# Patient Record
Sex: Male | Born: 1940 | Race: White | Hispanic: No | Marital: Married | Smoking: Former smoker
Health system: Southern US, Community
[De-identification: ages and names within clinical notes are randomized; demographics above are authoritative.]

## PROBLEM LIST (undated history)

## (undated) DIAGNOSIS — C801 Malignant (primary) neoplasm, unspecified: Secondary | ICD-10-CM

## (undated) DIAGNOSIS — G4733 Obstructive sleep apnea (adult) (pediatric): Secondary | ICD-10-CM

## (undated) DIAGNOSIS — D649 Anemia, unspecified: Secondary | ICD-10-CM

## (undated) DIAGNOSIS — T4145XA Adverse effect of unspecified anesthetic, initial encounter: Secondary | ICD-10-CM

## (undated) DIAGNOSIS — IMO0001 Reserved for inherently not codable concepts without codable children: Secondary | ICD-10-CM

## (undated) DIAGNOSIS — I5022 Chronic systolic (congestive) heart failure: Secondary | ICD-10-CM

## (undated) DIAGNOSIS — F329 Major depressive disorder, single episode, unspecified: Secondary | ICD-10-CM

## (undated) DIAGNOSIS — J449 Chronic obstructive pulmonary disease, unspecified: Secondary | ICD-10-CM

## (undated) DIAGNOSIS — I2510 Atherosclerotic heart disease of native coronary artery without angina pectoris: Secondary | ICD-10-CM

## (undated) DIAGNOSIS — I472 Ventricular tachycardia, unspecified: Secondary | ICD-10-CM

## (undated) DIAGNOSIS — I219 Acute myocardial infarction, unspecified: Secondary | ICD-10-CM

## (undated) DIAGNOSIS — I10 Essential (primary) hypertension: Secondary | ICD-10-CM

## (undated) DIAGNOSIS — I255 Ischemic cardiomyopathy: Secondary | ICD-10-CM

## (undated) DIAGNOSIS — I4891 Unspecified atrial fibrillation: Secondary | ICD-10-CM

## (undated) DIAGNOSIS — Z95811 Presence of heart assist device: Secondary | ICD-10-CM

## (undated) DIAGNOSIS — G2581 Restless legs syndrome: Secondary | ICD-10-CM

## (undated) DIAGNOSIS — J61 Pneumoconiosis due to asbestos and other mineral fibers: Secondary | ICD-10-CM

## (undated) DIAGNOSIS — Z9981 Dependence on supplemental oxygen: Secondary | ICD-10-CM

## (undated) DIAGNOSIS — E785 Hyperlipidemia, unspecified: Secondary | ICD-10-CM

## (undated) DIAGNOSIS — K7460 Unspecified cirrhosis of liver: Secondary | ICD-10-CM

## (undated) DIAGNOSIS — E039 Hypothyroidism, unspecified: Secondary | ICD-10-CM

## (undated) DIAGNOSIS — F32A Depression, unspecified: Secondary | ICD-10-CM

## (undated) DIAGNOSIS — E669 Obesity, unspecified: Secondary | ICD-10-CM

## (undated) DIAGNOSIS — Z9989 Dependence on other enabling machines and devices: Secondary | ICD-10-CM

## (undated) DIAGNOSIS — R040 Epistaxis: Secondary | ICD-10-CM

## (undated) DIAGNOSIS — I4729 Other ventricular tachycardia: Secondary | ICD-10-CM

## (undated) DIAGNOSIS — Z9289 Personal history of other medical treatment: Secondary | ICD-10-CM

## (undated) DIAGNOSIS — T8859XA Other complications of anesthesia, initial encounter: Secondary | ICD-10-CM

## (undated) DIAGNOSIS — I509 Heart failure, unspecified: Secondary | ICD-10-CM

## (undated) DIAGNOSIS — Z7901 Long term (current) use of anticoagulants: Secondary | ICD-10-CM

## (undated) HISTORY — DX: Chronic obstructive pulmonary disease, unspecified: J44.9

## (undated) HISTORY — DX: Hyperlipidemia, unspecified: E78.5

## (undated) HISTORY — DX: Ischemic cardiomyopathy: I25.5

## (undated) HISTORY — DX: Chronic systolic (congestive) heart failure: I50.22

## (undated) HISTORY — PX: APPENDECTOMY: SHX54

## (undated) HISTORY — DX: Other ventricular tachycardia: I47.29

## (undated) HISTORY — DX: Ventricular tachycardia: I47.2

## (undated) HISTORY — DX: Ventricular tachycardia, unspecified: I47.20

## (undated) HISTORY — DX: Pneumoconiosis due to asbestos and other mineral fibers: J61

## (undated) HISTORY — DX: Long term (current) use of anticoagulants: Z79.01

## (undated) HISTORY — DX: Hypothyroidism, unspecified: E03.9

## (undated) HISTORY — DX: Heart failure, unspecified: I50.9

## (undated) HISTORY — PX: CARDIAC DEFIBRILLATOR PLACEMENT: SHX171

## (undated) HISTORY — DX: Atherosclerotic heart disease of native coronary artery without angina pectoris: I25.10

## (undated) HISTORY — DX: Unspecified atrial fibrillation: I48.91

## (undated) HISTORY — DX: Presence of heart assist device: Z95.811

## (undated) HISTORY — DX: Obesity, unspecified: E66.9

---

## 2000-07-27 ENCOUNTER — Emergency Department (HOSPITAL_COMMUNITY): Admission: EM | Admit: 2000-07-27 | Discharge: 2000-07-27 | Payer: Self-pay | Admitting: Emergency Medicine

## 2001-09-30 HISTORY — PX: CORONARY ARTERY BYPASS GRAFT: SHX141

## 2002-08-27 ENCOUNTER — Ambulatory Visit (HOSPITAL_COMMUNITY): Admission: RE | Admit: 2002-08-27 | Discharge: 2002-08-27 | Payer: Self-pay | Admitting: Cardiology

## 2002-08-27 ENCOUNTER — Encounter: Payer: Self-pay | Admitting: Cardiology

## 2002-09-08 ENCOUNTER — Encounter: Payer: Self-pay | Admitting: Cardiothoracic Surgery

## 2002-09-08 ENCOUNTER — Inpatient Hospital Stay (HOSPITAL_COMMUNITY): Admission: RE | Admit: 2002-09-08 | Discharge: 2002-09-15 | Payer: Self-pay | Admitting: Cardiothoracic Surgery

## 2002-09-09 ENCOUNTER — Encounter: Payer: Self-pay | Admitting: Cardiothoracic Surgery

## 2002-09-10 ENCOUNTER — Encounter: Payer: Self-pay | Admitting: Cardiothoracic Surgery

## 2002-09-11 ENCOUNTER — Encounter: Payer: Self-pay | Admitting: Cardiothoracic Surgery

## 2002-10-08 ENCOUNTER — Encounter: Admission: RE | Admit: 2002-10-08 | Discharge: 2002-10-08 | Payer: Self-pay | Admitting: Cardiothoracic Surgery

## 2002-10-08 ENCOUNTER — Encounter: Payer: Self-pay | Admitting: Cardiothoracic Surgery

## 2002-10-18 ENCOUNTER — Encounter (HOSPITAL_COMMUNITY): Admission: RE | Admit: 2002-10-18 | Discharge: 2002-11-15 | Payer: Self-pay | Admitting: Cardiology

## 2003-03-02 ENCOUNTER — Encounter: Payer: Self-pay | Admitting: Cardiology

## 2003-03-02 ENCOUNTER — Encounter: Payer: Self-pay | Admitting: Internal Medicine

## 2003-03-02 ENCOUNTER — Ambulatory Visit (HOSPITAL_COMMUNITY): Admission: RE | Admit: 2003-03-02 | Discharge: 2003-03-02 | Payer: Self-pay | Admitting: Cardiology

## 2003-05-11 ENCOUNTER — Ambulatory Visit (HOSPITAL_COMMUNITY): Admission: RE | Admit: 2003-05-11 | Discharge: 2003-05-11 | Payer: Self-pay | Admitting: Internal Medicine

## 2003-05-11 ENCOUNTER — Encounter: Payer: Self-pay | Admitting: Internal Medicine

## 2006-10-30 ENCOUNTER — Ambulatory Visit: Payer: Self-pay | Admitting: Internal Medicine

## 2007-01-05 ENCOUNTER — Ambulatory Visit: Payer: Self-pay | Admitting: Internal Medicine

## 2007-03-09 ENCOUNTER — Encounter: Payer: Self-pay | Admitting: Internal Medicine

## 2007-04-21 ENCOUNTER — Ambulatory Visit: Payer: Self-pay | Admitting: Internal Medicine

## 2007-05-18 ENCOUNTER — Ambulatory Visit: Payer: Self-pay | Admitting: Internal Medicine

## 2007-05-18 LAB — CONVERTED CEMR LAB
BUN: 17 mg/dL (ref 6–23)
Basophils Absolute: 0 10*3/uL (ref 0.0–0.1)
Basophils Relative: 0.2 % (ref 0.0–1.0)
CO2: 28 meq/L (ref 19–32)
Calcium: 10.1 mg/dL (ref 8.4–10.5)
Chloride: 105 meq/L (ref 96–112)
Creatinine, Ser: 0.9 mg/dL (ref 0.4–1.5)
Eosinophils Absolute: 0.3 10*3/uL (ref 0.0–0.6)
Eosinophils Relative: 3.2 % (ref 0.0–5.0)
GFR calc Af Amer: 109 mL/min
GFR calc non Af Amer: 90 mL/min
Glucose, Bld: 104 mg/dL — ABNORMAL HIGH (ref 70–99)
HCT: 40.2 % (ref 39.0–52.0)
Hemoglobin: 14.1 g/dL (ref 13.0–17.0)
Lymphocytes Relative: 28.5 % (ref 12.0–46.0)
MCHC: 35.2 g/dL (ref 30.0–36.0)
MCV: 90.2 fL (ref 78.0–100.0)
Monocytes Absolute: 1 10*3/uL — ABNORMAL HIGH (ref 0.2–0.7)
Monocytes Relative: 9.4 % (ref 3.0–11.0)
Neutro Abs: 6.2 10*3/uL (ref 1.4–7.7)
Neutrophils Relative %: 58.7 % (ref 43.0–77.0)
Platelets: 155 10*3/uL (ref 150–400)
Potassium: 4.3 meq/L (ref 3.5–5.1)
RBC: 4.46 M/uL (ref 4.22–5.81)
RDW: 13.5 % (ref 11.5–14.6)
Sodium: 137 meq/L (ref 135–145)
WBC: 10.5 10*3/uL (ref 4.5–10.5)

## 2007-05-22 ENCOUNTER — Ambulatory Visit (HOSPITAL_COMMUNITY): Admission: RE | Admit: 2007-05-22 | Discharge: 2007-05-22 | Payer: Self-pay | Admitting: Cardiology

## 2007-07-15 ENCOUNTER — Ambulatory Visit: Payer: Self-pay | Admitting: Internal Medicine

## 2007-10-12 ENCOUNTER — Ambulatory Visit: Payer: Self-pay

## 2008-01-12 ENCOUNTER — Ambulatory Visit: Payer: Self-pay

## 2008-04-18 ENCOUNTER — Ambulatory Visit: Payer: Self-pay

## 2008-08-08 ENCOUNTER — Ambulatory Visit: Payer: Self-pay | Admitting: Internal Medicine

## 2008-11-21 ENCOUNTER — Ambulatory Visit: Payer: Self-pay | Admitting: Internal Medicine

## 2008-11-22 ENCOUNTER — Encounter: Payer: Self-pay | Admitting: Internal Medicine

## 2009-02-17 ENCOUNTER — Encounter (INDEPENDENT_AMBULATORY_CARE_PROVIDER_SITE_OTHER): Payer: Self-pay | Admitting: *Deleted

## 2009-02-20 ENCOUNTER — Ambulatory Visit: Payer: Self-pay | Admitting: Internal Medicine

## 2009-02-20 ENCOUNTER — Encounter: Payer: Self-pay | Admitting: Internal Medicine

## 2009-05-01 ENCOUNTER — Ambulatory Visit: Payer: Self-pay | Admitting: Internal Medicine

## 2009-05-23 ENCOUNTER — Encounter: Payer: Self-pay | Admitting: Internal Medicine

## 2009-08-14 ENCOUNTER — Ambulatory Visit: Payer: Self-pay | Admitting: Internal Medicine

## 2009-08-18 ENCOUNTER — Encounter: Payer: Self-pay | Admitting: Internal Medicine

## 2009-08-29 ENCOUNTER — Encounter: Payer: Self-pay | Admitting: Internal Medicine

## 2010-01-23 ENCOUNTER — Encounter: Payer: Self-pay | Admitting: Internal Medicine

## 2010-02-07 ENCOUNTER — Encounter: Payer: Self-pay | Admitting: Internal Medicine

## 2010-03-29 ENCOUNTER — Telehealth (INDEPENDENT_AMBULATORY_CARE_PROVIDER_SITE_OTHER): Payer: Self-pay | Admitting: *Deleted

## 2010-05-18 ENCOUNTER — Ambulatory Visit: Payer: Self-pay | Admitting: Cardiovascular Disease

## 2010-10-26 ENCOUNTER — Ambulatory Visit: Payer: Self-pay | Admitting: Cardiovascular Disease

## 2010-10-30 NOTE — Progress Notes (Signed)
  Records Recieved from [facility] [place] gave to [facility] [place]  March 29, 2010 3:01 PM

## 2010-10-30 NOTE — Letter (Signed)
Summary: [facility] Office Visit Note   [facility] Office Visit Note   Imported By: Roderic Ovens 04/25/2010 14:38:08  _____________________________________________________________________  External Attachment:    Type:   Image     Comment:   External Document

## 2010-10-30 NOTE — Letter (Signed)
Summary: [facility] CT Chest. Cath & Consult 2003  - 2004   [facility] CT Chest. Cath & Consult 2003  - 2004   Imported By: Roderic Ovens 04/25/2010 14:44:37  _____________________________________________________________________  External Attachment:    Type:   Image     Comment:   External Document

## 2010-10-30 NOTE — Progress Notes (Signed)
Summary: [facility] Office Visit Note   [facility] Office Visit Note   Imported By: Roderic Ovens 04/25/2010 14:37:32  _____________________________________________________________________  External Attachment:    Type:   Image     Comment:   External Document

## 2010-10-30 NOTE — Letter (Signed)
Summary: [facility] Office Visit Note   [facility] Office Visit Note   Imported By: Roderic Ovens 04/25/2010 14:38:28  _____________________________________________________________________  External Attachment:    Type:   Image     Comment:   External Document

## 2010-10-30 NOTE — Miscellaneous (Signed)
Summary: med list  Clinical Lists Changes  Medications: Added new medication of SYNTHROID 100 MCG TABS (LEVOTHYROXINE SODIUM) 1 by mouth once daily Added new medication of ASPIRIN ADULT LOW STRENGTH 81 MG TBEC (ASPIRIN) 1 by mouth once daily Added new medication of STOOL SOFTENER 100 MG TABS (DOCUSATE SODIUM) 1 two times a day Added new medication of MULTIVITAMINS   TABS (MULTIPLE VITAMIN) 1 by mouth once daily Added new medication of CARVEDILOL 25 MG TABS (CARVEDILOL) 1/2 by mouth two times a day Added new medication of LISINOPRIL 40 MG TABS (LISINOPRIL) 1/2 by mouth once daily Added new medication of SIMVASTATIN 80 MG TABS (SIMVASTATIN) Take 1/2 tablet by mouth daily at bedtime Added new medication of RANITIDINE HCL 150 MG CAPS (RANITIDINE HCL) 1 by mouth two times a day Added new medication of FISH OIL 1000 MG CAPS (OMEGA-3 FATTY ACIDS) 1 by mouth two times a day Added new medication of WARFARIN SODIUM 5 MG TABS (WARFARIN SODIUM) Use as directed by Anticoagulation Clinic

## 2010-10-31 ENCOUNTER — Encounter (INDEPENDENT_AMBULATORY_CARE_PROVIDER_SITE_OTHER): Payer: Self-pay | Admitting: *Deleted

## 2010-11-07 NOTE — Letter (Signed)
Summary: Appointment - Reminder 2  [facility], Main Office  [address]   [place], [place] [address]   Phone: [phone]  Fax: [phone]     October 31, 2010 MRN: [id number]   ALYXANDER KOLLMANN [address] [place], [place]  [address]   Dear Mr. MCKAMIE,  Our records indicate that it is time to schedule a device check.  Dr.Nahser recommended that you follow up with our device clinic in February. It is very important that we reach you to schedule this appointment. We look forward to participating in your health care needs. Please contact us at the number listed above at your earliest convenience to schedule your appointment.  If you are unable to make an appointment at this time, give [place] a call so we can update our records.     Sincerely,   Neurosurgeon Team Unable to reach by phone [phone]

## 2010-11-08 ENCOUNTER — Encounter (INDEPENDENT_AMBULATORY_CARE_PROVIDER_SITE_OTHER): Payer: Medicare Other

## 2010-11-08 ENCOUNTER — Encounter: Payer: Self-pay | Admitting: Internal Medicine

## 2010-11-08 DIAGNOSIS — I428 Other cardiomyopathies: Secondary | ICD-10-CM

## 2010-11-15 NOTE — Procedures (Signed)
Summary: DEFIB/AMD   Current Medications (verified): 1)  Synthroid 100 Mcg Tabs (Levothyroxine Sodium) .[name].. 1 By Mouth Once Daily 2)  Aspirin Adult Low Strength 81 Mg Tbec (Aspirin) .[name].. 1 By Mouth Once Daily 3)  Stool Softener 100 Mg Tabs (Docusate Sodium) .[name].. 1 Two Times A Day 4)  Multivitamins   Tabs (Multiple Vitamin) .[name].. 1 By Mouth Once Daily 5)  Carvedilol 25 Mg Tabs (Carvedilol) .... 1/2 By Mouth Two Times A Day 6)  Lisinopril 40 Mg Tabs (Lisinopril) .... 1/2 By Mouth Once Daily 7)  Simvastatin 80 Mg Tabs (Simvastatin) .... Take 1/2 Tablet By Mouth Daily At Bedtime 8)  Ranitidine Hcl 150 Mg Caps (Ranitidine Hcl) .[name].. 1 By Mouth Two Times A Day 9)  Warfarin Sodium 5 Mg Tabs (Warfarin Sodium) .... Use As Directed By Anticoagulation Clinic 10)  Saw Palmetto 450 Mg Caps (Saw Palmetto (Serenoa Repens)) .... 2 By Mouth Two Times A Day  Allergies (verified): No Known Drug Allergies    ICD Specifications Following MD:  Sherryl Manges, MD     ICD Vendor:  Medtronic     ICD Model Number:  [id number]     ICD Serial Number:  [id number] ICD DOI:  01/25/2010     ICD Implanting MD:  NOT IMPLANTED BY [place]  Lead 1:    Location: RV     DOI: 05/11/2003     Model #: [id number]     Serial #: [id number]     Status: active  Indications::  ICM  Explantation Comments: 01/25/10 [id number] H explanted  ICD Follow Up Remote Check?  No Battery Voltage:  3.18 V     Charge Time:  5.9 seconds     Underlying rhythm:  SR ICD Dependent:  No       ICD Device Measurements Right Ventricle:  Amplitude: 11.4 mV, Impedance: 361 ohms, Threshold: 1.25 V at 0.4 msec  Episodes Coumadin:  Yes Shock:  0     ATP:  5     Nonsustained:  1     Ventricular Pacing:  1.3%  Brady Parameters Mode VVI     Lower Rate Limit:  40      Tachy Zones VF:  188     VT:  146     Next Cardiology Appt Due:  01/29/2011 Tech Comments:  Change out done 01/25/10 @ the [facility].  Patient wants to be followed by [place].  Lead impedance alert values  reprogrammed.3 VT episodes noted all 09/05/10, 2 of which were treated with ATP X 2 with success after the 2nd burst.    ROV 3 months with Dr. Graciela Husbands in [place]. Altha Harm, LPN  November 08, 2010 2:01 PM

## 2010-12-06 NOTE — Cardiovascular Report (Signed)
Summary: Office Visit   Office Visit   Imported By: Roderic Ovens 11/26/2010 15:48:40  _____________________________________________________________________  External Attachment:    Type:   Image     Comment:   External Document

## 2010-12-12 ENCOUNTER — Ambulatory Visit (INDEPENDENT_AMBULATORY_CARE_PROVIDER_SITE_OTHER): Payer: Medicare Other | Admitting: Cardiovascular Disease

## 2010-12-12 DIAGNOSIS — Z9861 Coronary angioplasty status: Secondary | ICD-10-CM

## 2010-12-12 DIAGNOSIS — I5022 Chronic systolic (congestive) heart failure: Secondary | ICD-10-CM

## 2010-12-21 ENCOUNTER — Encounter (INDEPENDENT_AMBULATORY_CARE_PROVIDER_SITE_OTHER): Payer: Self-pay | Admitting: *Deleted

## 2010-12-25 ENCOUNTER — Other Ambulatory Visit: Payer: Self-pay | Admitting: *Deleted

## 2010-12-25 ENCOUNTER — Encounter: Payer: Self-pay | Admitting: *Deleted

## 2010-12-25 DIAGNOSIS — I2510 Atherosclerotic heart disease of native coronary artery without angina pectoris: Secondary | ICD-10-CM

## 2010-12-26 ENCOUNTER — Ambulatory Visit (HOSPITAL_COMMUNITY): Payer: Medicare Other | Attending: Cardiovascular Disease | Admitting: Radiology

## 2010-12-26 VITALS — Ht 72.0 in | Wt 243.0 lb

## 2010-12-26 DIAGNOSIS — R0989 Other specified symptoms and signs involving the circulatory and respiratory systems: Secondary | ICD-10-CM

## 2010-12-26 DIAGNOSIS — I2510 Atherosclerotic heart disease of native coronary artery without angina pectoris: Secondary | ICD-10-CM

## 2010-12-26 DIAGNOSIS — R0789 Other chest pain: Secondary | ICD-10-CM

## 2010-12-26 DIAGNOSIS — R079 Chest pain, unspecified: Secondary | ICD-10-CM | POA: Insufficient documentation

## 2010-12-26 DIAGNOSIS — I25810 Atherosclerosis of coronary artery bypass graft(s) without angina pectoris: Secondary | ICD-10-CM

## 2010-12-26 DIAGNOSIS — I252 Old myocardial infarction: Secondary | ICD-10-CM

## 2010-12-26 DIAGNOSIS — R0609 Other forms of dyspnea: Secondary | ICD-10-CM

## 2010-12-26 MED ORDER — REGADENOSON 0.4 MG/5ML IV SOLN
0.4000 mg | Freq: Once | INTRAVENOUS | Status: AC
  Administered 2010-12-26: 0.4 mg via INTRAVENOUS

## 2010-12-26 MED ORDER — TECHNETIUM TC 99M TETROFOSMIN IV KIT
33.0000 | PACK | Freq: Once | INTRAVENOUS | Status: AC | PRN
  Administered 2010-12-26: 33 via INTRAVENOUS

## 2010-12-26 NOTE — Progress Notes (Signed)
[facility] 3 NUCLEAR MED [address] 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 History of intermittent AFIB, CHF,ICM,and Echo. Cardiac Risk Factors: Family History - CAD, History of Smoking and Lipids  Symptoms:  Chest Tightness with Exertion (last date of chest discomfort yesterday) radiates to stomach, DOE, Fatigue, Fatigue with Exertion, SOB.   Nuclear Pre-Procedure Caffeine/Decaff Intake:  None NPO After: 6:00 PM   Lungs:  Clear IV 0.9% NS with Angio Cath:  18G  IV Site: R Antecubital  IV Started by:  Irean Hong, RN  Chest Size (in):  48 Cup Size:   NA  Height: 6' (1.829 m)  Weight:  243 lb (110.224 kg)  BMI:  Body mass index is 32.96 kg/(m^2). Tech Comments:  Held carvedilol this am.    Nuclear Med Study 1 or 2 day study: 2 day  Stress Test Type:  Lexiscan  Reading MD:  Dietrich Pates MD Order Authorizing Provider:  Dr. Jannette Spanner  Resting Radionuclide: Technetium 28m Tetrofosmin  Resting Radionuclide Dose: 33 mCi   Stress Radionuclide:  Technetium 34m Tetrofosmin  Stress Radionuclide Dose: 33 mCi           Stress Protocol Rest HR: 55 Stress HR: 68  Rest BP: 136/53 Stress BP: 122/52  Exercise Time:  NA min NA  Predicted HR: 151 % of Maximum: 45.03    Predicted Max HR: 151 bpm % Max HR: 45.03 bpm Rate Pressure Product: 8296    Dose of Adenosine:  NA mg Dose of Lexiscan:  0.4 mg  Dose of Atropine:  NA mg Dose of Dobutamine:  NA mcg/kg/min (at max HR)  Stress Test Technologist: Irean Hong, RN  Nuclear Technologist:  Harlow Asa, CNMT     Rest Procedure:  Myocardial perfusion  imaging was performed at rest 45 minutes following the intravenous administration of Technetium 61m Tetrofosmin. Rest ECG: SB with nonspecific T wave changes, marked sinus arrhythmia, PVC's, and fusion beat.  Stress Procedure:  The patient received IV Lexiscan 0.4 mg over 15-seconds.  Technetium 30m Tetrofosmin injected at 30-seconds.  There were nonspecific T wave changes with Lexiscan that were present at baseline. There were frequent PVC's, trigeminy, and begiminy.  Quantitative spect images were obtained after a 45 minute delay. Stress ECG: No significant change from baseline ECG  QPS Raw Data Images:  Soft tissue (diaphragm, bowel activity) underlie heart. Stress Images:  Large defect in the anterior (mid, distal), anteorlateral (distal), anteroseptal (mid, distal)inferolateral (base, mid, distal) inferior (base,mid, distal) and apeical walls. Rest Images:  Large defect in the anterior wall (mid, distal), anterolateral wall (distal), anterosetal wall (mid, distal), inferolateral wall (base,mid, distal) inferior wall (base, mid, distal) and apex. Subtraction (SDS):  No evidence of ischemia.  Findings Risk Category:  Low risk nuclear study. Clinically Abnormal:  No Ischemia:  No Fixed Defect: Fixed defect in the anterior, anterolateral, inferolateral, inferior, anteroseptal and apical walls. LV Dysfunction:  Yes Transient Ischemic Dilatation (Normal <1.22):1.03 Lung/Heart Ratio (Normal <0.45): .44  Quantitative Gated Spect Images QGS EDV: 285 ml QGS ESV:  194 ml QGS cine images:  Hypokinesis/akinesis of the inferior, mid/distal anteror, distal anteroseptal and apical walls. QGS EF:  32 %  Impression Exercise Capacity:  Lexiscan with no exercise. BP Response:  Normal blood pressure response. Clinical Symptoms:  No chest pain. ECG Impression:  No significant ST segment change suggestive of ischemia. Comparison with Prior Nuclear Study: Unable to review.  Done elsewhere in  08/2009  Overall Impression:  Scar in the anterior, anterolateral, anteroseptal, inferior, inferolateral and apical walls.  No ischemia.

## 2010-12-27 ENCOUNTER — Ambulatory Visit (HOSPITAL_COMMUNITY): Payer: Medicare Other | Attending: Cardiovascular Disease | Admitting: Radiology

## 2010-12-27 DIAGNOSIS — R0789 Other chest pain: Secondary | ICD-10-CM

## 2010-12-27 MED ORDER — TECHNETIUM TC 99M TETROFOSMIN IV KIT
33.0000 | PACK | Freq: Once | INTRAVENOUS | Status: AC | PRN
  Administered 2010-12-27: 33 via INTRAVENOUS

## 2010-12-27 NOTE — Letter (Signed)
Summary: Appointment - Reminder 2  [facility], Main Office  [address]   [place], [place] [address]   Phone: [phone]  Fax: [phone]     December 21, 2010 MRN: [id number]   AQUIL DUHE [address] [place], [place]  [address]   Dear Mr. HAIK,  Our records indicate that it is past time to schedule a device check appointment.  Dr.Nahser recommended that you follow up with [place] in February. It is very important that we reach you to schedule this appointment. We look forward to participating in your health care needs. Please contact us at the number listed above at your earliest convenience to schedule your appointment.  If you are unable to make an appointment at this time, give [place] a call so we can update our records.     Sincerely,   Glass blower/designer

## 2010-12-28 ENCOUNTER — Telehealth: Payer: Self-pay | Admitting: Cardiovascular Disease

## 2010-12-28 NOTE — Telephone Encounter (Signed)
PT'S WIFE CALLED SAYING [facility] SAID PT'S  TEST RESULTS WOULD BE READY THIS MORNING BY 8AM AND THEY HAVE NOT HEARD ANYTHING.

## 2011-01-16 ENCOUNTER — Ambulatory Visit: Payer: Medicare Other | Admitting: Cardiovascular Disease

## 2011-01-21 ENCOUNTER — Encounter: Payer: Self-pay | Admitting: Cardiovascular Disease

## 2011-02-12 NOTE — Progress Notes (Signed)
[facility] OFFICE NOTE   TYREES, CHOPIN                     MRN:          [id number]  DATE:08/08/2008                            DOB:          April 05, 1941    [name] is seen in followup for ICD implanted for primary prevention.  He has a history of paroxysmal atrial fibrillation.  He is doing well at  this point without complaints of chest pain, shortness of breath, or  peripheral edema.   MEDICATIONS:  1. Synthroid 100.  2. Aspirin 81.  3. Coreg 12.5.  4. Lisinopril 20.  5. Simvastatin 40.  6. Ranitidine 150 b.i.d.  7. Multivitamins.  8. Warfarin.   PHYSICAL EXAMINATION:  VITAL SIGNS:  His blood pressure was well-  controlled of 126/69.  His pulse was 54.  His weight was 243.  LUNGS:  Clear.  HEART:  Sounds were regular.  EXTREMITIES:  Without edema.   I should note that he uses nocturnal oxygen.   Interrogation of his Medtronic Maximo ICD demonstrates an R-wave of 10.4  with impedance of 324 with a threshold of 1 volt at 0.4.  Battery  voltage was 2.88.   IMPRESSION:  1. Ischemic cardiomyopathy.      a.     Prior bypass.      b.     Depressed left ventricular function, previously less than       30%.  2. Status post implantable cardioverter-defibrillator for the above.  3. Atrial fibrillation - Persistent on Coumadin.   Mr. Hanton is doing quite well at this time.  No arrhythmia issues are  active.  He will follow up with Dr. Aleen Campi.  We will see him again in  device followup in 12 months and will be followed remotely in the  interim.     Duke Salvia, MD, [facility]  Electronically Signed    SCK/MedQ  DD: 08/08/2008  DT: 08/09/2008  Job #: [id number]   cc:   Antionette Char, MD

## 2011-02-12 NOTE — Letter (Signed)
April 21, 2007    Jaclyn Prime. Lucas Mallow, M.D.  [address]  [place], [place] [address]   RE:  [name], [name]  MRN:  [id number]  /  DOB:  1941-06-27   Dear Onalee Hua,   [name] comes in today.  He is, as you know, in atrial fibrillation  as you detected about a month or so ago.  He has been initiated on  Coumadin, and apparently his INRs are now therapeutic.  He notes no  symptoms with the atrial fibrillation.   There is, however, an issue that he alludes to that I do not quite  understand that has to do with his heart not getting enough oxygen.  This was done by some overnight probe, and I wonder whether this does  not refer to nocturnal O2 desaturation.  He is supposed to be seen at  the [facility] for this later this week.   CURRENT MEDICATIONS:  1. Synthroid 100.  2. Coreg 12.5 b.i.d.  3. Lisinopril 20.  4. Simvastatin.  5. Ranitidine.  6. Coumadin.   PHYSICAL EXAMINATION:  VITAL SIGNS:  His blood pressure is mildly  elevated at 130/66, pulse 52.  LUNGS:  Clear.  HEART:  Sounds were irregular.  EXTREMITIES:  Without edema.   Interrogation of his Medtronic Woodson ICD demonstrates an R wave of  8.3, with impedance of 344, threshold at 1 V at 0.4.  The battery  voltage is 3.04.  His device was reprogrammed to maximize longevity.  Heart rate excursion was noted to be relatively flat and somewhat  surprisingly so.   IMPRESSION:  1. Ischemic heart disease.      a.     Status post bypass.      b.     Prior percutaneous coronary intervention.      c.     Ejection fraction 20%-25%.  2. Class I-II congestive heart failure.  3. Status post implantable cardioverter defibrillator for the above.  4. Recent diagnosis of atrial fibrillation.  5. Question of sleep apnea.   Onalee Hua, I would like to pursue cardioversion of [name].  Even in the  absence of symptoms, given that there is a 25% chance that he would be  in sinus rhythm at the end of a year and that the Coumadin issue  is  nonnegotiable, the restoration of sinus rhythm could only be beneficial  in that situation, I think.   We will plan to get the information from [place] at your office as to  when his INRs have been therapeutic and then proceed with DC  cardioversion potentially through his defibrillator.   I look forward to getting the information also about his oxygen thing,  because it may be that his atrial fibrillation might be benefitted also.    Sincerely,      Duke Salvia, MD, [facility]  Electronically Signed    SCK/MedQ  DD: 04/21/2007  DT: 04/22/2007  Job #: [id number]

## 2011-02-12 NOTE — Letter (Signed)
Feb 20, 2009    Ryan Char, MD  [address]  [place], [place] [address]   RE:  Ryan, Wood  MRN:  [id number]  /  DOB:  1941-03-15   Dear [name]:   [name] is seen in followup for ICD implanted for primary  prevention.  He is doing quite well at this point.  He has no complaints  of chest pain, shortness of breath, or palpitations.   MEDICATIONS:  His medications are unchanged.  He continues on warfarin,  lisinopril, Coreg 12.5 b.i.d. up titration, of which is limited by  bradycardia. Aspirin and Synthroid.   PHYSICAL EXAMINATION:  His blood pressure was 114/60, his pulse was 56,  his weight was 237, which is down 6 pounds since last fall.  His neck  veins were flat.  His lungs were clear.  Heart sounds were irregular.  Abdomen was soft.  Extremities were without edema.   Interrogation of his Medtronic ICD demonstrates a battery voltage 2.74,  a R-wave of 9.1, impedance of 336. A threshold was checked, but I do not  have it recorded.  Unfortunately there is no intercurrent therapies.   IMPRESSION:  1. Ischemic cardiomyopathy with      a.     Prior bypass surgery.      b.     Depressed left ventricular function with ejection fraction       about 30%.  2. Atrial fibrillation - Permanent on Coumadin.  3. Status post implantable cardioverter-defibrillator for the above      without intercurrent therapies.   [name], [name] is doing quite well.   We will plan to see him again as part of routine device followup.  He  did mention that he heard that your retired, I have not heard that, but  congratulations.    Sincerely,      Ryan Salvia, MD, [facility]  Electronically Signed    SCK/MedQ  DD: 02/20/2009  DT: 02/21/2009  Job #: [id number]

## 2011-02-12 NOTE — Assessment & Plan Note (Signed)
[facility]                         ELECTROPHYSIOLOGY OFFICE NOTE   QUANTAE, MARTEL                     MRN:          [id number]  DATE:07/15/2007                            DOB:          1941/06/05    [name] is seen, following ICD implantation in the state of ischemic  heart disease.  He had atrial fibrillation that was paroxysmal, reverted  spontaneously.  He holds sinus rhythm.  He has no complaints of chest  pain or shortness of breath.   CURRENT MEDICATIONS:  Include Synthroid, lisinopril 20, Simvastatin 40,  Warfarin and Coreg 12.5 b.i.d.   EXAMINATION:  VITAL SIGNS:  Today, his blood pressure was 114/67 with a  pulse of 59.  LUNGS:  Clear.  Heart sounds were regular.  EXTREMITIES:  Were without edema.   Interrogation of his Medtronic ICD demonstrates an R-wave of 8.7 with  impedance of 344 and threshold of 1.5 at 0.3 milliseconds, battery  voltage 3.02.  The device was re-programmed for adequate RV pacing.  His  device is programmed in a somewhat unusual way.  It is a 1-zone device,  without intercurrent therapies, and he has monitors on from 146 to 188.   IMPRESSION:  1. Ischemic heart disease.  2. Atrial fibrillation - paroxysmal.  3. Status post ICD for primary prevention.  4. Ischemic heart disease with prior bypass surgery.   Mr. Rossetti is stable.  We will see him again in three months' time.  He  will follow up with Dr. Lucas Mallow as otherwise scheduled.     Duke Salvia, MD, [facility]  Electronically Signed    SCK/MedQ  DD: 07/15/2007  DT: 07/16/2007  Job #: [id number]   cc:   Jaclyn Prime. Lucas Mallow, M.D.

## 2011-02-12 NOTE — Discharge Summary (Signed)
NAMEDEMORIO, SEELEY              ACCOUNT NO.:  [id number]   MEDICAL RECORD NO.:  [id number]          PATIENT TYPE:  OIB   LOCATION:  2899                         FACILITY:  [facility]   PHYSICIAN:  Maple Mirza, PA   DATE OF BIRTH:  1940-11-25   DATE OF ADMISSION:  05/22/2007  DATE OF DISCHARGE:  05/22/2007                               DISCHARGE SUMMARY   He was admitted to Short Stay C May 22, 2007, for DC cardioversion.   BRIEF HISTORY:  This is a 70 year old male who has a history of ischemic  cardiomyopathy, has also three-vessel coronary artery disease and is  status post coronary artery bypass graft surgery.  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  He  has also had a cardioverter defibrillator implanted for cardiomyopathy  and coronary artery disease.   He was diagnosed with atrial fibrillation on an office visit by Dr.  Aggie Cosier about one month ago.  He has been placed on Coumadin and his  INRs have been therapeutic for about four weeks.  He presented to [facility] 22, 2007, for cardioversion and was found to be  in sinus bradycardia with frequent PVCs.   The patient, with his atrial fibrillation, has absolutely no symptoms.  He does not have chest pain or chest discomfort.  He does not have  dyspnea.  He does not have fatigue.  He does not have dizziness.  He has  had no history of frank syncope.   MEDICATIONS:  The patient is on the following medications.  1. Synthroid 100 mcg daily.  2. Enteric-coated aspirin 81 mg daily.  3. Coreg 25 mg one half tablet twice daily.  4. Lisinopril 20 mg daily.  5. Simvastatin 40 mg nightly.  6. Zantac 150 mg twice daily.  7. Coumadin as directed.  8. Vitamin something daily.  9. Fish oil caps daily.   The question of what to do next looms.  The patient will discharge with  the promise that we will call him at [phone] to discuss future plans.  These may  include possibly antiarrhythmic therapy should he converted  once again the atrial fibrillation or using a device to tell us the  burden of atrial fibrillation. He may certainly benefit from follow-up  echocardiograms in the future to see if there is a tachycardia mediated  cardiomyopathy associated with atrial fibrillation.      Maple Mirza, PA     GM/MEDQ  D:  05/22/2007  T:  05/23/2007  Job:  [id number]

## 2011-02-15 ENCOUNTER — Ambulatory Visit (INDEPENDENT_AMBULATORY_CARE_PROVIDER_SITE_OTHER): Payer: Medicare Other | Admitting: Internal Medicine

## 2011-02-15 ENCOUNTER — Encounter: Payer: Self-pay | Admitting: Internal Medicine

## 2011-02-15 DIAGNOSIS — I2589 Other forms of chronic ischemic heart disease: Secondary | ICD-10-CM

## 2011-02-15 DIAGNOSIS — I255 Ischemic cardiomyopathy: Secondary | ICD-10-CM

## 2011-02-15 DIAGNOSIS — I428 Other cardiomyopathies: Secondary | ICD-10-CM

## 2011-02-15 DIAGNOSIS — I509 Heart failure, unspecified: Secondary | ICD-10-CM

## 2011-02-15 DIAGNOSIS — I472 Ventricular tachycardia: Secondary | ICD-10-CM | POA: Insufficient documentation

## 2011-02-15 DIAGNOSIS — Z95810 Presence of automatic (implantable) cardiac defibrillator: Secondary | ICD-10-CM | POA: Insufficient documentation

## 2011-02-15 DIAGNOSIS — I4891 Unspecified atrial fibrillation: Secondary | ICD-10-CM

## 2011-02-15 NOTE — Assessment & Plan Note (Signed)
No recurrent ventricular tachycardia 

## 2011-02-15 NOTE — Discharge Summary (Signed)
   NAME:  [name], [name]                        ACCOUNT NO.:  [id number]   MEDICAL RECORD NO.:  [id number]                   PATIENT TYPE:  OIB   LOCATION:  3732                                 FACILITY:  [facility]   PHYSICIAN:  Duke Salvia, M.D.               DATE OF BIRTH:  [birth year]   DATE OF ADMISSION:  05/11/2003  DATE OF DISCHARGE:  05/11/2003                                 DISCHARGE SUMMARY   PRIMARY DIAGNOSIS:  Ischemic cardiomyopathy.   HISTORY OF PRESENT ILLNESS:  This is a 70 year old gentleman, retired, who  works as a Electrical engineer at [employer] with a history of multiple infarcts,  bypass surgery in the fall of 2003. Has been having recurrent falls and  night sweats and is not feeling well. Cardiac scanning demonstrated anterior  interior fall infarct extending to the apex with lateral scar, minimal peri-  infarct ischemia felt to be low risk with an EF of 28%. Because of this, the  patient was considered for an implantable cardiac defibrillator. The patient  was enrolled in the Masters trial but waveforms were unable to be diagnosed  secondary to multiple ventricular ectopy. He was then admitted for placement  of a Medtronic ICD. He underwent placement of a Medtronic type ICD on March  11. He tolerated the procedure well and had no immediate postoperative  complications and was discharged later that evening on all of his previous  medications:  Synthroid 0.1 mg daily, Altace 1 mg b.i.d., coated aspirin 81  daily, Zocor 40 daily, multivitamin daily. He was instructed to remove his  outer bandage in the morning and leave tape in place and to clean his wound  once with Betadine. Tylenol one to two tablets every four to six hours as  needed for pain. Activity and wound care were per pacemaker discharge sheet.  The patient was not allowed to drive for approximately one week. Low fat,  low salt, low cholesterol diet. He was scheduled to be seen at the pacemaker  clinic at  the [facility] office August 25 at 9:30 a.m. and follow with Dr.  Graciela Husbands November 16 at 12 noon.      Chinita Pester, C.R.N.P. [facility]                 Duke Salvia, M.D.    DS/MEDQ  D:  05/11/2003  T:  05/12/2003  Job:  [id number]   cc:   Jaclyn Prime. Lucas Mallow, M.D.  [address]  [place]  [place] [address]  Fax: [phone]

## 2011-02-15 NOTE — Assessment & Plan Note (Signed)
Stable coronary disease.

## 2011-02-15 NOTE — Assessment & Plan Note (Signed)
The patient's device was interrogated.  The information was reviewed. No changes were made in the programming.    

## 2011-02-15 NOTE — Assessment & Plan Note (Signed)
Permanent. We will stop his aspirin. We had a lengthy discussion regarding a oral anticoagulation alternatives. [facility] as to whether [facility] is eligible for Pr[facility]a. He will let us know.

## 2011-02-15 NOTE — Patient Instructions (Signed)
Stop your Aspirin. Your physician recommends that you schedule a follow-up appointment in: 1 year with Dr. Graciela Husbands

## 2011-02-15 NOTE — Cardiovascular Report (Signed)
[name], [name]                        ACCOUNT NO.:  [id number]   MEDICAL RECORD NO.:  [id number]                   PATIENT TYPE:  OIB   LOCATION:  2899                                 FACILITY:  [facility]   PHYSICIAN:  Madaline Savage, M.D.             DATE OF BIRTH:  1940-11-17   DATE OF PROCEDURE:  08/27/2002  DATE OF DISCHARGE:                              CARDIAC CATHETERIZATION   PROCEDURES PERFORMED:  1. Selective coronary angiography by Judkins technique.  2. Retrograde left heart catheterization.  3. Left ventricular angiography.  4. Abdominal aortography.  5. Left subclavian arteriography and nonselective visualization of the     internal mammary artery.   ENTRY SITE:  Right femoral.   DYE USED:  Omnipaque.   COMPLICATIONS:  None.   PATIENT PROFILE:  The patient is a 70 year old gentleman who is under the  primary care of Dr. Adela Lank with a history of tobacco use,  hypertension, known coronary artery disease and family history of coronary  disease. He had a anteroseptal myocardial infarction in 1997 and prior to  that he had angioplasty of his LAD in 1991 and 1992.  Recently, the patient  was reevaluated with a Persantine Cardiolite stress test and was found to  have a dilated left ventricle with extensive scar involving the apex,  inferior and inferolateral walls, and there was superimposed ischemia in the  anterior walls and anteroseptal walls and inferior and lateral walls.  We  decided to cardiac catheterization today electively on an outpatient basis.   RESULTS:  PRESSURES:  The left ventricular pressure was 135/13, end-  diastolic pressure 33, central aortic pressure 135/65, mean of 95.  Left  ventricular ejection fraction calculation was 40%.   ANGIOGRAPHIC RESULTS:  The left main coronary artery was a very long vessel  containing luminal irregularities and some calcifications but no high-grade  lesions.   The left anterior descending coronary  artery appears recanalized in the  ostium and proximal portions of the vessel with about a 90% stenosis just  after a small diagonal branch.  This vessel fills in an antegrade fashion  with TIMI-1 flow that is very delayed.  It does reach the apex eventually.  Several diagonal branches are small and under filled.  The LAD that is  visualized appears to be of adequate caliber for bypass grafting.   The left circumflex coronary artery gives rise to a major diagonal branch  and to a circumflex branch.  There are collaterals from the obtuse marginal  branch, the posterior descending branch, which is a fairly large vessel.  The circumflex obtuse marginal branch in the midportion of this vessel  contains a 75% stenosis.   The right coronary artery contains a 75% stenosis proximally near the  takeoff of the pulmonary conus branch, a second lesion of about 60% is seen  in the mid right coronary artery just after the acute marginal branch.  The  distal RCA is a little ectatic and is potentially graftable.  The posterior  descending branch is diseased.  The posterolateral branch is occluded and  there are septal perforated collaterals that go to the LAD.   The left subclavian artery is normal.  The internal mammary artery  nonselectively filled appears normal.  The left ventricle is dilated.  The  inferobasal and anterobasal wall segments are mildly hypokinetic.  The  distal one-half of the inferior wall, apex and lower portion of the  anterolateral wall are all dyskinetic and dilated.  No obvious LV thrombosis  is seen.  There is no mitral regurgitation noted.   ABDOMINAL AORTOGRAM:  Abdominal aorta shows no evidence of aneurysm  formation.  Both common iliacs are normal. The renal arteries are normal.   FINAL DIAGNOSES:  1. Ischemic cardiomyopathy.  Ejection fraction 40% with dyskinetic segment     and inferobasal apical and lower anterolateral wall areas.  2. Multivessel coronary artery  disease of all three vessels as described     above.     a. A 100% recanalized occlusion of ostial and proximal left anterior        descending.     b. A 75% mid circumflex obtuse marginal branch stenosis.     c. A 75% proximal right coronary artery stenosis and 60% mid right        coronary artery stenosis.  3. Normal renal arteries.  4. Normal abdominal aorta.  5. Normal left subclavian artery.  6. Patent internal mammary artery, left.   RECOMMENDATIONS:  The patient should be considered for coronary artery  bypass grafting.   ADDENDUM:  The patient was noted to have a 7 mm nodule in his lung on  preprocedural chest x-ray examination.  We will proceed with a CT scan today  to evaluate this nodule further.  The patient will be scheduled to be seen  on an outpatient basis with the [facility].                                                Madaline Savage, M.D.    WHG/MEDQ  D:  08/27/2002  T:  08/27/2002  Job:  [id number]   cc:   Ronney Lion, M.D.  [address]  [place]  [place] [address]  Fax: [phone]   Cardiac Catheterization Laboratory

## 2011-02-15 NOTE — Discharge Summary (Signed)
NAME:  [name], [name]                        ACCOUNT NO.:  [id number]   MEDICAL RECORD NO.:  [id number]                   PATIENT TYPE:  INP   LOCATION:  2013                                 FACILITY:  [facility]   PHYSICIAN:  Kerin Perna, M.D.               DATE OF BIRTH:  01/25/1941   DATE OF ADMISSION:  09/08/2002  DATE OF DISCHARGE:  09/15/2002                                 DISCHARGE SUMMARY   ADMISSION DIAGNOSIS:  Two vessel coronary artery disease.   PAST MEDICAL HISTORY:  1. Coronary artery disease, status post coronary angioplasty in 1991 and     1992, MI in 1997.  At that time, he underwent cardiac catheterization     which showed multiple coronary vessel disease with ejection fraction of     45%.  He was treated medically and followed.  2. Tobacco use.  3. Pulmonary nodules were noted on CT scan August 19, 2002.  They were     noncalcified round lesions, no mediastinal adenopathy or large masses     were noted.  He does recount asbestos exposure when he was in the [employer].  4. GERD.   PAST SURGICAL HISTORY:  Laparotomy and small-bowel resection in 1972.   ALLERGIES:  No known drug allergies.   DISCHARGE DIAGNOSIS:  Two vessel coronary artery disease, status post  coronary artery bypass graft.   HISTORY OF PRESENT ILLNESS:  The patient is a 70 year old Caucasian male. He  underwent a follow-up Cardiolite study in early November 2003.  This  demonstrated reduced LV function with ischemia  in anterior septal region  and scarring of the anterior apical inferior walls.  Because of these  findings, he was scheduled for an outpatient cardiac catheterization by Dr.  Elsie Lincoln.  This was performed on August 27, 2002.  The catheterization  revealed two vessel coronary artery disease not amenable to PTCA.  He was  therefore referred for surgical consultation.  Dr. Donata Clay evaluated the  patient later in the day on August 27, 2002.  After examination of the  patient,  review of all of the available records including the  catheterization films, Dr. Donata Clay agreed that coronary artery bypass  grafting was the preferred treatment choice for this gentlemen.  The  procedure risks and benefits were discussed with the patient.  He agreed to  proceed.  The patient returns to see Dr. Donata Clay in the office on September 03, 2002.  On that day, arterial evaluation was performed.  Results were no  significant carotid artery disease.  He was noted to have palpable pedal  pulses bilaterally.  Dr. Donata Clay again discussed the details of the  surgery and the patient agreed to proceed. Surgery is scheduled for September 08, 2002.   HOSPITAL COURSE:  On September 08, 2002, the patient was electively admitted  to [facility] in the care of  Dr. Kathlee Nations Trigt.  He  underwent the following surgical procedure.  Coronary artery bypass grafting  x3.  Grafts placed at the time of the procedure were left internal mammary  artery grafted to left anterior descending coronary artery, saphenous vein  was grafted to the obtuse marginal artery, saphenous vein was grafted to the  posterior descending coronary artery.  Vein was harvested from the right  thigh via the endovein harvest technique.  The patient tolerated the  procedure well and was transferred in stable condition to the SICU.  He  remained hemodynamically stable in the immediate postoperative period and he  was extubated later that evening.   The patient's only postoperative issue was rate controlled atrial  fibrillation.  This post episode was on September 10, 2002.  Amiodarone was  initiated.  He continued to vacillate between sinus rhythm and atrial  fibrillation.  He was then started on Lopressor on September 11, 2002.  The  Coumadin was also initiated.  He converted to normal sinus rhythm on  September 12, 2002, and has maintained that rhythm since.   The morning of September 14, 2002, this was  postoperative day #6, the patient  reports feeling much better today.  His blood pressure is 126/72, he is  afebrile.  His heart is in normal sinus rhythm.  His lungs are clear.  He is  tolerating a regular diet.  His bowel and bladder functions are within  normal limits for him.  His incisions are healing well.  He has no lower  extremity edema.  His ambulation is improving (he reports that he is feeling  less tired).  His pain is well controlled.  He is sleeping well.  If the  patient continues recovering in this fashion, it is anticipated he will be  ready for discharge home tomorrow, September 15, 2002.   He is on Coumadin therapy.  His INR is 1.2 today on September 14, 2002.   LABORATORY DATA:  Recent laboratory studies on September 11, 2002, CBC showed  white blood cells 13.2, hemoglobin 10.3, hematocrit 29.6, platelets 96.  Chemistries on September 11, 2002, showed sodium 135, potassium 4.2, BUN 14,  creatinine 0.7, glucose 118.  The morning of September 14, 2002, PT is 15.2,  INR 1.2.   CONDITION ON DISCHARGE:  Improved.   DISCHARGE MEDICATIONS:  1. Tylox one to two p.o. q.4-6h. p.r.n. for pain.  2. Atenolol 25 mg p.o. q.d.  3. Coumadin dose to be determined at discharge, most likely 5 mg a day until     PT/INR is checked on September 17, 2002.  4. Altace 2.5 mg p.o. q.d.  5. Amiodarone 200 mg p.o. b.i.d., 8 a.m. and 8 p.m.  6. Colace 200 mg p.o. q.d.  7. Multivitamin daily.   ACTIVITY:  He has been asked to refrain from any driving, heavy lifting,  pushing or pulling.  He has also been instructed to continue his breathing  exercises and daily walking.  Smoking cessation has been discussed with the  patient on several occasions and he acknowledges that he has no interest in  smoking.  He has been encouraged to follow up with his primary care  physician should he find smoking cessation to become difficult in the  future.   DIET:  Low fat, low salt.  WOUND CARE:  He may shower  with mild soap and water.  He has been asked to  inspect his incisions daily.  If they become red, hot, swollen, draining,  or  if he has a fever greater than 101 degrees F, he is to call Dr. Zenaida Niece Trigt's  office.   FOLLOW UP:  1. He will be asked to go to the [facility]     office on Friday, September 17, 2002, for PT/INR blood draw.  They will     monitor his Coumadin therapy.  2. Dr. Elsie Lincoln would like to see him in the office in approximately two     weeks.  He has been asked to call that     office to arrange an appointment.  3. He has an appointment to see Dr. Donata Clay at the [facility] office on Friday,     October 08, 2002, at 10:30 in the morning.  He will also be asked to get a     chest x-ray at [facility] at 9:30 that morning.     Toribio Harbour, R.N.                  Kerin Perna, M.D.    CTK/MEDQ  D:  09/14/2002  T:  09/15/2002  Job:  [id number]   cc:   Madaline Savage, M.D.  [address]  [place]  [place] [address]  Fax: [phone]   Brooke Bonito, M.D.  [address]  [place]  [place] [address]  Fax: [phone]

## 2011-02-15 NOTE — Assessment & Plan Note (Signed)
[facility]                         ELECTROPHYSIOLOGY OFFICE NOTE   RIPKEN, REKOWSKI                     MRN:          [id number]  DATE:01/05/2007                            DOB:          October 26, 1940    Mr. Bickford was seen today in the clinic on January 05, 2007 for followup of  his Medtronic model No. 7230 Marquis.  Date of implant was 05/11/2003 for  ischemic cardiomyopathy.  On interrogation of his device today, his  battery voltage is 3.06 with a charge time of 7.92 seconds.  R-waves  measured 10.6 millivolts  with a ventricular capture threshold of 1 volt  at 0.4 milliseconds and a ventricular lead impedence of 372 ohms.  Shock  impedence was 51.  There were 3 non-sustained episodes since last  interrogation.  No changes were made in his parameters.  He will be seen  again in 4 months' time.      Altha Harm, LPN  Electronically Signed      Duke Salvia, MD, [facility]  Electronically Signed   PO/MedQ  DD: 01/05/2007  DT: 01/05/2007  Job #: [id number]

## 2011-02-15 NOTE — Letter (Signed)
October 30, 2006    Jaclyn Prime. Lucas Mallow, M.D.  [address]  [place], [place] [address]   RE:  [name], [name]  MRN:  [id number]  /  DOB:  [birth year]   Dear [name]:   [name] has come back to reestablish his ICD care here, as he has his  cardiology care with you [facility].   As you know he had his triple bypass in 2003 with an ejection fraction  of 15-20% narrow QRS and underwent ICD implantation in August of 2004  and has not been seen in our office since November of that year.  He has  had no intercurrent episodes.   He denies complaints of chest pain, shortness of breath, palpitations,  syncope, orthopnea, or nocturnal dyspnea.   PAST MEDICAL HISTORY:  Notable for fatigue, constipation, breathing  problems, sexual dysfunction, thyroid disease, anxiety, and depression,  as well as morbid obesity.   SOCIAL HISTORY:  He is married.  He has 2 children.  He does not use  cigarettes, alcohol, or recreational drugs, having stopped smoking a  couple of years ago.   PAST SURGICAL HISTORY:  Includes intestinal blockage, angioplasty, and  bypass surgery.   CURRENT MEDICATIONS:  1. Include ranitidine 150 b.i.d.  2. Simvastatin 40.  3. Lisinopril 20.  4. Coreg 12.5 b.i.d.  5. Aspirin.  6. Synthroid 100 mcg.   He has no known drug allergies.   EXAMINATION:  Blood pressure is 120/72, pulse 55.  LUNGS:  Clear.  HEART:  Sounds are regular.  EXTREMITIES:  Without edema.  ABDOMEN:  Protuberant.  SKIN:  Warm and dry.  NECK:  Veins were flat and the carotids were brisk.   Interrogation of his Medtronic 7230 ICD demonstrates an R wave of 9.9  with impedence of 352 with a threshold of 1V at 0.4.  Battery voltage of  3.07.   IMPRESSION:  1. Ischemic heart disease.      a.     Status post bypass.      b.     Status post percutaneous coronary intervention, ejection       fraction 20-25%.  2. Class I to II symptoms.  3. Morbid obesity.  4. Status post  implantable cardioverter defibrillator for #1.  5. Question sleep apnea.   [name], [name] is doing fine from an arrhythmia point of view.  We  will plan to see him again in 3 months' time.  I have encouraged him to  begin an exercise program with a goal of 1 to 2 pounds weight loss per  month.  He will work on this.    Sincerely,      Duke Salvia, MD, [facility]  Electronically Signed    SCK/MedQ  DD: 10/30/2006  DT: 10/30/2006  Job #: [id number]

## 2011-02-15 NOTE — Progress Notes (Signed)
  HPI  [name] is a 70 y.o. male seen in followup for ICD implantation for primary prevention in the setting of ischemic heart disease with prior bypass surgery and depressed left ventricular function. He has permanent atrial fibrillation and is on Coumadin Myoview scanning 2010 demonstrating prior infarct with  ejection fraction 25-30% with no ischemia  Patient had 3 VT episodes noted all 09/05/10, 2 of which were treated with ATP X 2 with success after the 2nd burst.   The patient denies   chest pain edema or palpitations.  There has been no syncope or presyncope. He has chronic shortness of breath and was seen by the pulmonologists at the [facility]; bronchodilators were prescribed.    Past Medical History  Diagnosis Date  . Ischemic heart disease   . CHF (congestive heart failure)     class I-II  . Atrial fibrillation   . Sleep apnea     ?  [name] Intestine disorder     blockage    Past Surgical History  Procedure Date  . Coronary artery bypass graft   . Cardiac defibrillator placement     Current Outpatient Prescriptions  Medication Sig Dispense Refill  . albuterol (PROVENTIL) (2.5 MG/3ML) 0.083% nebulizer solution Take 2.5 mg by nebulization every 6 (six) hours as needed.        [name] aspirin 81 MG EC tablet Take 81 mg by mouth daily.        . carvedilol (COREG) 25 MG tablet Take 12.5 mg by mouth 2 (two) times daily with a meal.        . docusate sodium (COLACE) 100 MG capsule Take 100 mg by mouth 2 (two) times daily.        [name] levothyroxine (SYNTHROID, LEVOTHROID) 100 MCG tablet Take 100 mcg by mouth daily.        [name] lisinopril (PRINIVIL,ZESTRIL) 40 MG tablet Take 20 mg by mouth daily.        . Multiple Vitamin (MULTIVITAMIN) tablet Take 1 tablet by mouth daily.        . nitroGLYCERIN (NITROSTAT) 0.4 MG SL tablet Place 0.4 mg under the tongue every 5 (five) minutes as needed.        . ranitidine (ZANTAC) 150 MG tablet Take 150 mg by mouth 2 (two) times daily.        . Saw  Palmetto 450 MG CAPS Take 2 capsules by mouth 2 (two) times daily.        . simvastatin (ZOCOR) 80 MG tablet Take 40 mg by mouth at bedtime.        [name] tiotropium (SPIRIVA) 18 MCG inhalation capsule Place 18 mcg into inhaler and inhale daily.        [name] warfarin (COUMADIN) 5 MG tablet Take 5 mg by mouth daily.          No Known Allergies  Review of Systems negative except from HPI and PMH  Physical Exam Well developed and well nourished in no acute distress HENT normal E scleral and icterus clear Neck Supple JVP flat; carotids brisk and full Clear to ausculation Regular rate and rhythm, no murmurs gallops or rub Soft with active bowel sounds No clubbing cyanosis and edema Alert and oriented, grossly normal motor and sensory function Skin Warm and Dry   Assessment and  Plan

## 2011-02-15 NOTE — Assessment & Plan Note (Signed)
He does not appear volume overloaded. His shortness of breath we will be COPD. Given his symptoms is worth considering the addition of Aldactone.

## 2011-02-15 NOTE — Op Note (Signed)
NAME:  [name], [name]                        ACCOUNT NO.:  [id number]   MEDICAL RECORD NO.:  [id number]                   PATIENT TYPE:  OIB   LOCATION:  2853                                 FACILITY:  [facility]   PHYSICIAN:  Duke Salvia, M.D.               DATE OF BIRTH:  Nov 11, 1940   DATE OF PROCEDURE:  05/11/2003  DATE OF DISCHARGE:                                 OPERATIVE REPORT   PREOPERATIVE DIAGNOSIS:  Ischemic cardiomyopathy with depressed left  ventricular function, ejection fraction of less than 30% with a narrow QRS;  master protocol.   POSTOPERATIVE DIAGNOSIS:  Ischemic cardiomyopathy with depressed left  ventricular function, ejection fraction of less than 30% with a narrow QRS;  master protocol.   OPERATION PERFORMED:  Single chamber defibrillator implantation with  intraoperative defibrillation threshold testing.   SURGEON:  Duke Salvia, M.D.   DESCRIPTION OF PROCEDURE:  Following the obtaining of informed consent, the  patient was brought to the electrophysiology laboratory and placed on the  fluoroscopic table in the supine position. After routine prep and drape,  intravenous contrast was injected via the left antecubital vein to identify  the course and patency of the extrathoracic left subclavian vein.  This  having been accomplished, lidocaine was infiltrated in the prepectoral  subclavicular region.  Incision was made and carried down to the layer of  the prepectoral fascia using the electrocautery and blunt dissection.  A  pocket was formed similarly.  Hemostasis was obtained.   Thereafter attention was turned to gaining access to the extrathoracic left  subclavian vein which was accomplished without difficulty.  However, on one  occasion, the artery was punctured and pressure was held for two minutes.  Subsequently, the vein was cannulated and a guidewire was placed and  retained.  A 0 silk suture was placed in figure-of-eight fashion and  allowed  to hang loosely.   Subsequently a 9 French tear-away introducer sheath was placed through which  was then passed a Medtronic Quattro Secure active fixation dual plug  defibrillator lead serial number [id number].  The model number was 6947.  Under fluoroscopic guidance it was manipulated to the right ventricular apex  where the bipolar R wave was 14mV with a pacing impedance of 708 ohms and a  pacing threshold of 0.5 msec with 0.7 V and currented threshold was 1.3 mA.  There was no diaphragmatic pacing at 10 V.  With these acceptable parameters  recorded, the lead was then attached to a Medtronic [place] [id number]  serial number [id number].  Through the device, the bipolar R-wave was 10 mV  with a pacing impedance of 608 ohms and pacing threshold of 1 V at 0.2 msec.   With these acceptable parameters recorded, the intraoperative defibrillation  threshold testing was undertaken.  Ventricular fibrillation was induced via  the T-wave shock.  After total duration of six seconds, a 14 joule shock was  delivered through a measured resistance of 42 ohms terminating ventricular  fibrillation restoring sinus rhythm.   After a wait of five minutes, ventricular fibrillation was reinduced via the  T-wave shock.  After a total duration of five seconds, a 14 joule shock was  delivered through a measured resistance of 41 ohms terminating ventricular  fibrillation restoring sinus rhythm.  With these acceptable parameters  recorded, the system was implanted.  Please note that there was one drop out  at 1.2 mV sensitivity.   The pocket was copiously irrigated with antibiotic containing saline  solution.  The lead and pulse generator were placed in the pocket, secured  to the prepectoral fascia.  The wound was then closed in three layers in the  normal fashion.  The wound was washed, dried and a benzoin, Steri-Strip  dressing was applied.  Needle counts, sponge counts and instrument counts   were correct at the end of the procedure according to the staff.   The patient's device was programmed as part of the master protocol.  The  patient tolerated the procedure without apparent complication.                                                Duke Salvia, M.D.    SCK/MEDQ  D:  05/11/2003  T:  05/11/2003  Job:  [id number]   cc:   Jaclyn Prime. Lucas Mallow, M.D.  [address]  [place]  [place] [address]  Fax: [phone]   Electrophysiology laboratory   Kathrine Cords, R.N. [facility]   [facility]

## 2011-02-15 NOTE — Op Note (Signed)
NAME:  [name], [name]                        ACCOUNT NO.:  [id number]   MEDICAL RECORD NO.:  [id number]                   PATIENT TYPE:  INP   LOCATION:  2311                                 FACILITY:  [facility]   PHYSICIAN:  Kerin Perna III, M.D.           DATE OF BIRTH:  06-17-1941   DATE OF PROCEDURE:  09/08/2002  DATE OF DISCHARGE:                                 OPERATIVE REPORT   OPERATION:  Coronary artery bypass grafting x3, (left internal mammary  artery to left anterior descending artery, saphenous vein graft to obtuse  marginal, saphenous vein graft to right coronary artery).   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   POSTOPERATIVE DIAGNOSIS:  Class 4 progressive  angina with reduced left  ventricular function and three vessel coronary artery disease.   SURGEON:  Kerin Perna, M.D.   ASSISTANT:  Gwenith Daily. Tyrone Sage, M.D., Eber Hong, P.A.   ANESTHESIA:  General by Dr. Claybon Jabs.   INDICATIONS FOR PROCEDURE:  The patient is a 70 year old white male smoker,  who was evaluated by Dr. Elsie Lincoln for dyspnea on exertion and an abnormal EKG.  Cardiac catheterization demonstrated severe three vessel coronary artery  disease with chronic occlusion of the LAD, chronic occlusion of the right  coronary artery and stenosis of the circumflex.  His ejection fraction was  35% and he was felt to be a candidate for surgical coronary  revascularization.  Prior to surgery, I examined the patient in the cath lab  holding area and reviewed the results of the cardiac catheterization with  the patient and his wife.  I discussed with the patient the indications and  expected benefits of coronary artery bypass graft  surgery for treatment of  his coronary artery disease.  I reviewed with the patient the alternatives  to surgical therapy for treatment of his coronary artery disease.   I  discussed with the patient the major aspects of the proposed operation  including the choice of conduit for grafting, the localization of the  surgical incisions, use of general anesthesia, and cardiopulmonary bypass,  and the expected postoperative hospital recovery.  I discussed with the  patient the risks to him of coronary artery bypass graft  surgery including  the risks of MI, CVA, bleeding, infection and death.  He understood that he  was at risks for pulmonary complications due to his long preexisting smoking  history.  He understood these implications for the surgery and agreed to  proceed with the operation as planned under what I felt was an informed  consent.   OPERATIVE FINDINGS:  The patient's vein was harvested from both upper legs.  The vein was of average quality.  The vein was too small below the knee to  use as a conduit.  The mammary artery was a small vessel but with adequate  flow.  The coronaries were poor targets.  The right coronary was thickened  and ectatic.  The LAD was chronically occluded and intramyocardial.  The  circumflex marginal was deeply intramyocardial.  There was scarring of the  myocardium on the anterior apical and lateral walls.   PROCEDURE:  The patient was brought to the operating room and placed supine  on the operating room table, where general anesthesia was induced under  invasive hemodynamic monitoring.  The chest, abdomen and legs were prepped  with Betadine and draped as a sterile field.  A sternal incision was made  and the saphenous vein was harvested from both legs.  The internal mammary  artery was harvested as a pedicle graft from its origin at the subclavian  vessels.  The left pleural space was obliterated with adhesions from prior  inflammatory disease.  The patient did have a prior asbestos exposure.  Heparin was then administered and the ACT was documented as being  therapeutic.  The sternal retractor was placed.  A  pericardial cradle was  created. A purse string was placed in the aorta and right atrium, and the  patient was cannulated and placed on bypass and cooled to 32 degrees.  The  coronaries were identified for grafting.  Cardioplegic cannulae were placed  for both antegrade and retrograde delivery of cold blood cardioplegia.  The  patient was cooled to 30 degrees.  The aortic cross clamp was applied, and  500 cc of cold blood cardioplegia was delivered in split doses between the  antegrade, aortic and retrograde coronary sinus cardioplegic catheters.  There was good cardioplegic arrest with septal temperature dropping less  than 15 degrees.  Topical iced saline slush was used to augment myocardial  preservation and a pericardial insulator pad was used to protect the left  phrenic nerve.   The distal coronary anastomoses was then performed.  The first distal  anastomosis was at the distal right coronary. This was a 1.8 mm vessel  proximal 95% stenosis.  A reverse saphenous vein was sewn end-to-side with a  running 7-0 Prolene with good flow through the graft.  The second distal  anastomosis was for the intramyocardial OM1.  This was a 1.5 mm vessel with  proximal 80% stenosis. A reverse saphenous vein was sewn end-to-side with a  running 8-0 Prolene and there was good flow through the graft.  Cardioplegia  was redosed.  The third distal anastomosis was at the distal third to the  LAD which was a 1.5 mm vessel proximal total occlusion which was  recanalized.  The left internal mammary artery pedicle was brought through  an opening created in the left lateral pericardium and was brought down onto  the LAD and sewn end-to-side with a running 8-0 Prolene. There was good flow  through the anastomosis with immediate rise in septal temperature after  release of the pedicle clamp on the mammary artery.  The mammary pedicle was  secured to the epicardium and aortic cross clamp was removed.  The heart  resumed a spontaneous rhythm.  Using the partial occluding clamp,  two proximal vein anastomoses were placed on the ascending aorta using a 4.0  mm punch and running 6-0 Prolene.  The partial clamp was removed and the  vein grafts were perfused.  Each had good flow in the hemostasis was  documented at the proximal and distal anastomoses. The patient was rewarmed  and reperfused.  When the patient reached 37 degrees, the lungs reexpanded  and the ventilator  was resumed.  The patient was then weaned from bypass in  sinus rhythm on low dose dopamine with stable blood pressure and cardiac  output.  Protamine was administered. There was no adverse reaction to the  protamine.  The cannulae were removed.  The mediastinum was irrigated with  warm antibiotic irrigation.  The patient remained hemodynamically stable.  The leg incision was irrigated and closed in a standard fashion.  The  pericardium was loosely reapproximated superiorly over the aorta and vein  grafts.  Two mediastinal and a left pleural tube which was over the  dissected pleural space from the take down of the adhesions were brought  through separate incisions. The sternum was closed with interrupted steel  wire.  The pectoralis fascia was closed with interrupted #1 Vicryl.  The  subcutaneous and skin were closed with running Vicryl and sterile dressings  were applied.  The patient returned to the ICU in stable condition.  Total  bypass time was 180 minutes with aortic cross clamp time of 70 minutes.                                               Mikey Bussing, M.D.    PV/MEDQ  D:  09/08/2002  T:  09/09/2002  Job:  [id number]   cc:   Madaline Savage, M.D.  [address], [address]  [place]  [place] [address]  Fax: [phone]

## 2011-02-15 NOTE — Consult Note (Signed)
[name], [name]                        ACCOUNT NO.:  [id number]   MEDICAL RECORD NO.:  [id number]                   PATIENT TYPE:  OIB   LOCATION:  2899                                 FACILITY:  [facility]   PHYSICIAN:  Mikey Bussing, M.D.           DATE OF BIRTH:  1940/12/21   DATE OF CONSULTATION:  08/27/2002  DATE OF DISCHARGE:                                   CONSULTATION   PRIMARY CARE PHYSICIAN:  Brooke Bonito, M.D.   REASON FOR CONSULTATION:  Severe three-vessel coronary artery disease, class  III progressive angina, and reduced left ventricular function, status post  anterior myocardial infarction.   CHIEF COMPLAINT:  Chest pain.   HISTORY OF PRESENT ILLNESS:  I was asked to evaluate this 70 year old male  by Madaline Savage, M.D., for potential surgical revascularization for  recently diagnosed progressive and severe three-vessel coronary artery  disease with reduced LV function.  The patient has had prior coronary  angioplasties approximately 10 years ago.  He presented with an anterior MI  in 1997 and at that time underwent cardiac catheterization, showing  occlusion of the LAD, occlusion of the distal circumflex, and moderate  disease of the right coronary.  His ejection fraction at that time was 45%.  He was treated medically and followed.  The patient unfortunately continued  to smoke.  A follow-up Cardiolite scan was performed this month, which  demonstrated a reduced LVEF with ischemia in the anteroseptal region and  scarring in the anterior apical and inferior walls.  Because of further  reduction on his LV function, he was scheduled for outpatient cardiac  catheterization by Madaline Savage, M.D., today.  This demonstrated a 95%  stenosis-recannulization of the proximal LAD, an occluded distal circumflex  system, an 80% stenosis of the OM1, and high-grade stenosis, 80%, of the mid  right coronary.  He had anterior apical akinesia and dyskinesia and  there  was no mitral regurgitation.  The left ventricular end-diastolic pressure  was 18 mmHg.  Because of the patient's progression in three-vessel disease  and further reduction in LV function with his exertional chest pain, he was  felt to be a candidate for surgical coronary revascularization.  The patient  describes as chest pain as exertional, substernal, squeezing, pressing pain  relieved by rest.  This occurs when he goes at an activity level higher than  normal, such as rapid walking or going up stairs.  As long as he goes slowly  and paces himself, he denies symptoms.  He denies any orthopnea, PND, or  ankle edema.   PAST MEDICAL HISTORY:  1. Active smoker.  2. Pulmonary nodules seen on CT scan today measuring 6 mm in the right upper     lobe and 7 mm in the right lower lobe.  These were noncalcified, round     lesions.  There is no mediastinal adenopathy or larger masses.  He  does     have some pleural plaquing about the diaphragm in each hemithorax.  He     does have a history of asbestos exposure when he was in the [employer].  3. Status post laparotomy and small bowel resection in 1972 for an     intestinal obstruction.  4. GERD.  5. Class III progressive angina.   ALLERGIES:  No known drug allergies.   CURRENT MEDICATIONS:  1. Zetia 10 mg a day.  2. Nifedipine 30 mg q.d.  3. Aspirin 81 mg a day.  4. Nitroglycerin 0.4 mg p.r.n.  5. Toprol XL 50 mg q.d.   SOCIAL HISTORY:  The patient is married and works at [employer]. [employer] as a security guard.  He smokes one-and-a-half packs of cigarettes  a day and denies alcohol use.   FAMILY HISTORY:  There is no family history of premature coronary disease.  Negative family history of diabetes.   REVIEW OF SYMPTOMS:  Constitutional review is negative for weight loss,  fever, or night sweats.  ENT review is negative for change in vision.  He  does use glasses for reading.  He denies any recent active dental  problems.  He denies difficulty swallowing.  The pulmonary review is positive for some  dyspnea on exertion with climbing or heavy activity.  He denies productive  cough, hemoptysis, recent bronchitis, pneumonia, or previous abnormal chest  x-ray.  He denies any thoracic trauma.  The cardiac review is positive for  his class III progressive angina, negative for CHF, and positive for MI in  1997.  The GI review is positive for GERD with a remote history of GI bleed  secondary to esophagitis and anemia.  The GI review is negative for jaundice  and hepatitis.  It is also is positive for his laparotomy.  The urologic  review is negative for hematuria, kidney stones, prostatism, or UTI.  The  vascular review is negative for claudication, DVT, TIA, or stroke.  The  hematologic review is positive for previous blood transfusion and negative  for bleeding disorder.  The endocrine review is negative for diabetes and  negative for thyroid disease.  A TSH was performed in the spring labs, which  was somewhat elevated at 8.2.  The musculoskeletal review is negative for  any significant trauma or fractures of the extremities or thorax.  The  neurologic review is negative for strokes, seizures, or concussion.  The  skin review is negative for chronic lesion or rash.   PHYSICAL EXAMINATION:  HEIGHT:  He is 6 feet tall.  WEIGHT:  He weighs 230 pounds.  VITAL SIGNS:  The blood pressure is 140/70, pulse 62 and regular,  respirations 18, temperature 97.8 degrees, and room air saturation 94%.  GENERAL APPEARANCE:  That of a pleasant, middle-aged, white male accompanied  by his wife in the cardiac catheterization holding area following outpatient  cardiac catheterization by Madaline Savage, M.D.  He was in no distress.  HEENT:  Normocephalic.  Full EOMs.  Dentition adequate.  NECK:  Without JVD, thyromegaly, mass, or bruit.  LYMPHATICS:  No palpable supraclavicular or cervical lymph nodes. THORAX:  Without  deformity.  Breath sounds are clear with some scattered  rhonchi.  CARDIAC:  A few ectopic beats without S3, gallop, murmur, or rub.  ABDOMEN:  Mildly obese and nontender with a well-healed laparotomy incision.  No organomegaly or abdominal bruit.  EXTREMITIES:  No cyanosis, clubbing, edema, or tenderness.  He has a  compression dressing in the right groin at the cardiac catheterization site.  RECTAL:  Deferred.  SKIN:  Warm, clear, and dry without rash or lesion.  VASCULAR:  2+ pulses in the pedal, femoral, popliteal, and radial areas.  There is no venous insufficiency of the lower extremities.  NEUROLOGIC:  Alert and oriented x 3 with full motor function.  No focal  motor deficit.   LABORATORY DATA:  I reviewed the arteriograms with Madaline Savage, M.D.  I agree with his interpretation of severe three-vessel disease with reduced  LV function.  The patient's laboratory work prior to cardiac catheterization  included a creatinine of 1.2, BUN 23, glucose 110, hematocrit 46%, white  count 9000, and platelet count 150,000.   IMPRESSION AND RECOMMENDATIONS:  The patient would benefit from surgical  revascularization with his three-vessel disease and reduced LV function.  I  reviewed the recommendation for surgery with the patient and his wife,  including the major aspects of the proposed operation and the associated  benefits and risks.  I feel it is important that the patient stop smoking  prior to this fairly elective procedure and he will discontinue smoking and  start working on an incentive spirometer, which was provided for the patient  at the hospital today.  I will see him back in the office in one week to  check on his pulmonary status and to set a date for surgery at that time.  The patient will continue his current medications and his current activity  level as directed by Madaline Savage, M.D.   Thank you very much for this consultation.                                                Mikey Bussing, M.D.    PV/MEDQ  D:  08/27/2002  T:  08/27/2002  Job:  [id number]   cc:   Brooke Bonito, M.D.  [address]  [place]  [place] [address]  Fax: [phone]

## 2011-03-26 ENCOUNTER — Encounter: Payer: Self-pay | Admitting: Cardiovascular Disease

## 2011-04-02 ENCOUNTER — Ambulatory Visit: Payer: Medicare Other | Admitting: Cardiovascular Disease

## 2011-04-19 ENCOUNTER — Encounter: Payer: Self-pay | Admitting: Cardiovascular Disease

## 2011-04-22 ENCOUNTER — Ambulatory Visit (INDEPENDENT_AMBULATORY_CARE_PROVIDER_SITE_OTHER): Payer: Medicare Other | Admitting: Cardiovascular Disease

## 2011-04-22 ENCOUNTER — Encounter: Payer: Self-pay | Admitting: Cardiovascular Disease

## 2011-04-22 VITALS — BP 104/64 | HR 60 | Ht 72.0 in | Wt 242.4 lb

## 2011-04-22 DIAGNOSIS — I509 Heart failure, unspecified: Secondary | ICD-10-CM

## 2011-04-22 DIAGNOSIS — I2510 Atherosclerotic heart disease of native coronary artery without angina pectoris: Secondary | ICD-10-CM

## 2011-04-22 NOTE — Assessment & Plan Note (Signed)
He seems to be very well compensated with his congestive heart failure. He remains on carvedilol and lisinopril. We'll continue with the same medications.

## 2011-04-22 NOTE — Assessment & Plan Note (Signed)
Saint remains very stable. He has not had any episodes of chest pain or shortness of breath. We will continue with his same medications.

## 2011-04-22 NOTE — Progress Notes (Signed)
[name] Date of Birth  12-16-1940 [facility] [address]     [address] [place], [place]  [address] [phone]  Fax  [phone]  History of Present Illness:  [name] is a 70 year old gentleman with a history of coronary artery disease-status post coronary artery bypass grafting in 2003. He has history of congestive heart failure with an ejection fraction of 32%. He has an AICD and had a generator replaced in April 2001. He also has a history of hyperlipidemia, hypothyroidism and intermittent atrial fibrillation. He is on chronic Coumadin therapy.  He gets all of his medications through the [facility].  Cage presents with no particular complaints today. He is able to do all of his normal activities. He remains very active around the house.   Current Outpatient Prescriptions on File Prior to Visit  Medication Sig Dispense Refill  . albuterol (PROVENTIL) (2.5 MG/3ML) 0.083% nebulizer solution Take 2.5 mg by nebulization every 6 (six) hours as needed.        [name] aspirin 81 MG EC tablet Take 81 mg by mouth daily.        . carvedilol (COREG) 25 MG tablet Take 12.5 mg by mouth 2 (two) times daily with a meal.        . docusate sodium (COLACE) 100 MG capsule Take 100 mg by mouth 2 (two) times daily.        [name] levothyroxine (SYNTHROID, LEVOTHROID) 100 MCG tablet Take 100 mcg by mouth daily.        [name] lisinopril (PRINIVIL,ZESTRIL) 40 MG tablet Take 20 mg by mouth daily.        . nitroGLYCERIN (NITROSTAT) 0.4 MG SL tablet Place 0.4 mg under the tongue every 5 (five) minutes as needed.        . ranitidine (ZANTAC) 150 MG tablet Take 150 mg by mouth 2 (two) times daily.        . simvastatin (ZOCOR) 80 MG tablet Take 40 mg by mouth at bedtime.        [name] tiotropium (SPIRIVA) 18 MCG inhalation capsule Place 18 mcg into inhaler and inhale daily.        [name] warfarin (COUMADIN) 5 MG tablet Take 5 mg by mouth daily.        . Saw Palmetto 450 MG CAPS  Take 2 capsules by mouth 2 (two) times daily.          No Known Allergies  Past Medical History  Diagnosis Date  . Ischemic cardiomyopathy      CABG 2003, PCI 2007  EF 30%(myoview 2010)  . CHF (congestive heart failure)     class I-II  . Atrial fibrillation     permanent  . Sleep apnea     ?  [name] Intestine disorder     blockage  . ICD (implantable cardiac defibrillator) in place   . Coronary artery disease   . Hyperlipidemia   . Hypothyroidism     Past Surgical History  Procedure Date  . Coronary artery bypass graft   . Cardiac defibrillator placement     History  Smoking status  . Former Smoker -- 2.0 packs/day for 45 years  . Types: Cigarettes  . Quit date: 11/11/2001  Smokeless tobacco  . Not on file    History  Alcohol Use No    Family History  Problem Relation Age of Onset  . Heart attack Mother   . Heart attack Father  Reviw of Systems:  Reviewed in the HPI.  All other systems are negative.  Physical Exam: BP 104/64  Pulse 60  Ht 6' (1.829 m)  Wt 242 lb 6.4 oz (109.952 kg)  BMI 32.88 kg/m2 The patient is alert and oriented x 3.  The mood and affect are normal.   Skin: warm and dry.  Color is normal.    HEENT:   the sclera are nonicteric.  The mucous membranes are moist.  The carotids are 2+ without bruits.  There is no thyromegaly.  There is no JVD.    Lungs: clear.  The chest wall is non tender.    Heart: regular rate with a normal S1 and S2.  There are no murmurs, gallops, or rubs. The PMI is not displaced.     Abdomen: good bowel sounds.  There is no guarding or rebound.  There is no hepatosplenomegaly or tenderness.  There are no masses.   Extremities:  no clubbing, cyanosis, or edema.  The legs are without rashes.  The distal pulses are intact.   Neuro:  Cranial nerves II - XII are intact.  Motor and sensory functions are intact.    The gait is normal.  Assessment / Plan:

## 2011-05-16 ENCOUNTER — Encounter: Payer: Medicare Other | Admitting: *Deleted

## 2011-05-20 ENCOUNTER — Encounter: Payer: Self-pay | Admitting: *Deleted

## 2011-07-25 ENCOUNTER — Ambulatory Visit (INDEPENDENT_AMBULATORY_CARE_PROVIDER_SITE_OTHER): Payer: Medicare Other | Admitting: Physician Assistant

## 2011-07-25 ENCOUNTER — Encounter: Payer: Self-pay | Admitting: Internal Medicine

## 2011-07-25 ENCOUNTER — Ambulatory Visit (INDEPENDENT_AMBULATORY_CARE_PROVIDER_SITE_OTHER): Payer: Medicare Other | Admitting: *Deleted

## 2011-07-25 ENCOUNTER — Other Ambulatory Visit: Payer: Self-pay | Admitting: Internal Medicine

## 2011-07-25 ENCOUNTER — Encounter: Payer: Self-pay | Admitting: Physician Assistant

## 2011-07-25 DIAGNOSIS — I4901 Ventricular fibrillation: Secondary | ICD-10-CM

## 2011-07-25 DIAGNOSIS — I472 Ventricular tachycardia, unspecified: Secondary | ICD-10-CM

## 2011-07-25 DIAGNOSIS — I2589 Other forms of chronic ischemic heart disease: Secondary | ICD-10-CM

## 2011-07-25 DIAGNOSIS — I4891 Unspecified atrial fibrillation: Secondary | ICD-10-CM

## 2011-07-25 DIAGNOSIS — Z95810 Presence of automatic (implantable) cardiac defibrillator: Secondary | ICD-10-CM

## 2011-07-25 DIAGNOSIS — I255 Ischemic cardiomyopathy: Secondary | ICD-10-CM

## 2011-07-25 DIAGNOSIS — I429 Cardiomyopathy, unspecified: Secondary | ICD-10-CM

## 2011-07-25 NOTE — Patient Instructions (Signed)
Your physician recommends that you schedule a follow-up appointment in: 2-4 weeks with Dr Graciela Husbands Your physician has requested that you have a lexiscan myoview. For further information please visit [email or web address] Please follow instruction sheet, as given.

## 2011-07-25 NOTE — Assessment & Plan Note (Signed)
Patient had ATP 3 times once in July, August, and September. He had his device checked at the [place] so we cannot pull up the strips. We are trying to obtain these records. Patient was asymptomatic with these events. I discussed this patient with Dr. Berton Mount who recommends a Lexiscan and follow-up with him. He had blood work a month ago that we are trying to obtain from his primary care doctor.

## 2011-07-25 NOTE — Progress Notes (Signed)
icd check in clinic  

## 2011-07-25 NOTE — Progress Notes (Signed)
HPI: This is a 70 year old white male patient with history of ischemic cardiomyopathy, ICD implantation with generator replaced in April 2001 at the [place], prior bypass surgery,paroxysmal atrial fibrillation and depressed LV function ejection fraction 32% on Myoview in 2010. He presents today because she was told at the [place] that his defibrillator went off 3 times. On interrogation he had a ATP once in July, August, and September. The patient did not feel this. We cannot pull up the tracings because he had it checked at the [place]. We're trying to obtain these tracings.  Patient denies chest pain, palpitations, worsening dyspnea, dizziness, or presyncope. He does feel like he has less exercise tolerance but this has been a gradual decline over the years. He uses home oxygen at night at 2 L.  No Known Allergies  Current Outpatient Prescriptions on File Prior to Visit  Medication Sig Dispense Refill  . albuterol (PROVENTIL) (2.5 MG/3ML) 0.083% nebulizer solution Take 2.5 mg by nebulization every 6 (six) hours as needed.        . carvedilol (COREG) 25 MG tablet Take 12.5 mg by mouth 2 (two) times daily with a meal.        . docusate sodium (COLACE) 100 MG capsule Take 100 mg by mouth 2 (two) times daily.        [name] levothyroxine (SYNTHROID, LEVOTHROID) 100 MCG tablet Take 100 mcg by mouth daily.        [name] lisinopril (PRINIVIL,ZESTRIL) 40 MG tablet Take 20 mg by mouth daily.        . nitroGLYCERIN (NITROSTAT) 0.4 MG SL tablet Place 0.4 mg under the tongue every 5 (five) minutes as needed.        . ranitidine (ZANTAC) 150 MG tablet Take 150 mg by mouth 2 (two) times daily.        . simvastatin (ZOCOR) 80 MG tablet Take 40 mg by mouth at bedtime.        [name] tiotropium (SPIRIVA) 18 MCG inhalation capsule Place 18 mcg into inhaler and inhale daily.        [name] warfarin (COUMADIN) 5 MG tablet Take 5 mg by mouth daily.          Past Medical History  Diagnosis Date  . Ischemic cardiomyopathy      CABG 2003, PCI 2007  EF  30%(myoview 2010)  . CHF (congestive heart failure)     class I-II  . Atrial fibrillation     permanent  . Sleep apnea     ?  [name] Intestine disorder     blockage  . ICD (implantable cardiac defibrillator) in place   . Coronary artery disease   . Hyperlipidemia   . Hypothyroidism     Past Surgical History  Procedure Date  . Coronary artery bypass graft   . Cardiac defibrillator placement     Family History  Problem Relation Age of Onset  . Heart attack Mother   . Heart attack Father     History   Social History  . Marital Status: Married    Spouse Name: N/A    Number of Children: N/A  . Years of Education: N/A   Occupational History  . Not on file.   Social History Main Topics  . Smoking status: Former Smoker -- 2.0 packs/day for 45 years    Types: Cigarettes    Quit date: 11/11/2001  . Smokeless tobacco: Not on file  . Alcohol Use: No  . Drug Use: No  . Sexually Active: Not  on file   Other Topics Concern  . Not on file   Social History Narrative  . No narrative on file    ROS: See HPI Eyes: Negative Ears:Negative for hearing loss, tinnitus Cardiovascular: Negative for chest pain, palpitations,irregular heartbeat, dyspnea, dyspnea on exertion, near-syncope, orthopnea, paroxysmal nocturnal dyspnia and syncope,edema, claudication, cyanosis,.  Respiratory:   Negative for cough, hemoptysis, sputum production and wheezing.   Endocrine: waking up at night sweats recently.  Hematologic/Lymphatic: Negative for adenopathy and bleeding problem.patient is on Coumadin  Musculoskeletal: Negative.   Gastrointestinal: Negative for nausea, vomiting, reflux, abdominal pain, diarrhea, constipation.   Neurological: Negative.  Allergic/Immunologic: Negative for environmental allergies.   PHYSICAL EXAM: Well-nournished, in no acute distress. Neck: No JVD, HJR, Bruit, or thyroid enlargement Lungs: No tachypnea, clear without wheezing, rales, or rhonchi Cardiovascular:  RRR, PMI not displaced,2/6 systolic murmur at the apex, positive S4, no bruit, thrill, or heave. Abdomen: BS normal. Soft without organomegaly, masses, lesions or tenderness. Extremities: without cyanosis, clubbing or edema. Good distal pulses bilateral SKin: Warm, no lesions or rashes  Musculoskeletal: No deformities Neuro: no focal signs  BP 98/68  Pulse 53  Ht 6' (1.829 m)  Wt 245 lb (111.131 kg)  BMI 33.23 kg/m2  EKG: Sinus bradycardia with T wave inversion in inferolateral

## 2011-07-25 NOTE — Assessment & Plan Note (Signed)
In sinus rhythm today

## 2011-07-25 NOTE — Assessment & Plan Note (Signed)
Patient has history of bypass surgery. His last stress Myoview was in 2010 at which time his ejection fraction was 32% and global systolic LV dysfunction which was severe and no ischemia. We will repeat a lexiscan.

## 2011-07-30 ENCOUNTER — Telehealth: Payer: Self-pay | Admitting: Internal Medicine

## 2011-07-30 NOTE — Telephone Encounter (Signed)
ROI faxed to Dept of [facility] @ [phone]  07/26/11/km

## 2011-08-01 ENCOUNTER — Other Ambulatory Visit (HOSPITAL_COMMUNITY): Payer: Medicare Other | Admitting: Radiology

## 2011-08-06 ENCOUNTER — Ambulatory Visit (HOSPITAL_COMMUNITY): Payer: Medicare Other | Attending: Physician Assistant | Admitting: Radiology

## 2011-08-06 DIAGNOSIS — Z95810 Presence of automatic (implantable) cardiac defibrillator: Secondary | ICD-10-CM | POA: Insufficient documentation

## 2011-08-06 DIAGNOSIS — I2510 Atherosclerotic heart disease of native coronary artery without angina pectoris: Secondary | ICD-10-CM

## 2011-08-06 DIAGNOSIS — R002 Palpitations: Secondary | ICD-10-CM | POA: Insufficient documentation

## 2011-08-06 DIAGNOSIS — E785 Hyperlipidemia, unspecified: Secondary | ICD-10-CM | POA: Insufficient documentation

## 2011-08-06 DIAGNOSIS — Z87891 Personal history of nicotine dependence: Secondary | ICD-10-CM | POA: Insufficient documentation

## 2011-08-06 DIAGNOSIS — I509 Heart failure, unspecified: Secondary | ICD-10-CM | POA: Insufficient documentation

## 2011-08-06 DIAGNOSIS — R0602 Shortness of breath: Secondary | ICD-10-CM

## 2011-08-06 DIAGNOSIS — R0609 Other forms of dyspnea: Secondary | ICD-10-CM

## 2011-08-06 DIAGNOSIS — I25810 Atherosclerosis of coronary artery bypass graft(s) without angina pectoris: Secondary | ICD-10-CM

## 2011-08-06 DIAGNOSIS — I4891 Unspecified atrial fibrillation: Secondary | ICD-10-CM | POA: Insufficient documentation

## 2011-08-06 DIAGNOSIS — I4949 Other premature depolarization: Secondary | ICD-10-CM

## 2011-08-06 DIAGNOSIS — I4901 Ventricular fibrillation: Secondary | ICD-10-CM

## 2011-08-06 DIAGNOSIS — I252 Old myocardial infarction: Secondary | ICD-10-CM | POA: Insufficient documentation

## 2011-08-06 DIAGNOSIS — Z8249 Family history of ischemic heart disease and other diseases of the circulatory system: Secondary | ICD-10-CM | POA: Insufficient documentation

## 2011-08-06 DIAGNOSIS — Z9861 Coronary angioplasty status: Secondary | ICD-10-CM | POA: Insufficient documentation

## 2011-08-06 DIAGNOSIS — I429 Cardiomyopathy, unspecified: Secondary | ICD-10-CM

## 2011-08-06 MED ORDER — TECHNETIUM TC 99M TETROFOSMIN IV KIT
11.0000 | PACK | Freq: Once | INTRAVENOUS | Status: AC | PRN
  Administered 2011-08-06: 11 via INTRAVENOUS

## 2011-08-06 MED ORDER — TECHNETIUM TC 99M TETROFOSMIN IV KIT
33.0000 | PACK | Freq: Once | INTRAVENOUS | Status: AC | PRN
  Administered 2011-08-06: 33 via INTRAVENOUS

## 2011-08-06 MED ORDER — REGADENOSON 0.4 MG/5ML IV SOLN
0.4000 mg | Freq: Once | INTRAVENOUS | Status: AC
  Administered 2011-08-06: 0.4 mg via INTRAVENOUS

## 2011-08-06 NOTE — Progress Notes (Signed)
[facility] SITE 3 NUCLEAR MED [address] [place] [address] [phone]  Cardiology Nuclear Med Study  [name] is a 70 y.o. male [id number] 02/11/1941   Nuclear Med Background Indication for Stress Test:  Evaluation for Ischemia, Graft Patency and PTCA Patency History: '94 Angioplasty, '03CABGx3, '04 Defibrillator, Heart Catheterization, '94 Myocardial Infarctionx3, 03/38/12 Myocardial Perfusion Study:scars, (-) ischemia  EF 32%Pacemaker and AFIB,CHF, ICM EF 32%  Cardiac Risk Factors: Family History - CAD, History of Smoking and Lipids  Symptoms:  Palpitations   Nuclear Pre-Procedure Caffeine/Decaff Intake:  None NPO After: 8:00pm   Lungs:  clear IV 0.9% NS with Angio Cath:  20g  IV Site: R Antecubital  IV Started by:  Stanton Kidney, EMT-P  Chest Size (in):  44  Cup Size: n/a  Height: 6' (1.829 m)  Weight:  242 lb (109.77 kg)  BMI:  Body mass index is 32.82 kg/(m^2). Tech Comments:  Coreg held this am, per patient.    Nuclear Med Study 1 or 2 day study: 1 day  Stress Test Type:  Eugenie Birks  Reading MD: Arvilla Meres, MD  Order Authorizing Provider:  S.Klein/M.Lenze  Resting Radionuclide: Technetium 59m Tetrofosmin  Resting Radionuclide Dose: 11.0 mCi   Stress Radionuclide:  Technetium 56m Tetrofosmin  Stress Radionuclide Dose: 3.0 mCi           Stress Protocol Rest HR: 56 Stress HR: 68  Rest BP: 105/60 Stress BP: 106/63  Exercise Time (min): n/a METS: n/a   Predicted Max HR: 151 bpm % Max HR: 45.03 bpm Rate Pressure Product: 7208   Dose of Adenosine (mg):  n/a Dose of Lexiscan: 0.4 mg  Dose of Atropine (mg): n/a Dose of Dobutamine: n/a mcg/kg/min (at max HR)  Stress Test Technologist: Milana Na, EMT-P  Nuclear Technologist:  Domenic Polite, CNMT     Rest Procedure:  Myocardial perfusion imaging was performed at rest 45 minutes following the intravenous administration of Technetium 58m Tetrofosmin. Rest ECG: NSR with  non-specific ST-T wave changes  Stress Procedure:  The patient received IV Lexiscan 0.4 mg over 15-seconds.  Technetium 62m Tetrofosmin injected at 30-seconds.  There were no changes, symptoms, and a rare pvc with Lexiscan.  Quantitative spect images were obtained after a 45 minute delay. Stress ECG: No significant change from baseline ECG  QPS Raw Data Images:  LV is dilated. Stress Images:  Markedly decreased uptake in the inferior, apical and mid to distal anterior walls. Rest Images:  Markedly decreased uptake in the inferior, apical and mid to distal anterior walls. Subtraction (SDS):  No evidence of ischemia. Transient Ischemic Dilatation (Normal <1.22):  1.02 Lung/Heart Ratio (Normal <0.45):  0.42  Quantitative Gated Spect Images QGS EDV:  246 ml QGS ESV:  180 ml QGS cine images:  The LV is markedly dilated. The lateral wall is the only wall that moves well. Apical dyskinesis. QGS EF: 27%  Impression Exercise Capacity:  Lexiscan with no exercise. BP Response:  n/a Clinical Symptoms:  n/a ECG Impression:  No significant ST segment change suggestive of ischemia. Comparison with Prior Nuclear Study: No significant change from previous study  Overall Impression:  Abnormal stress nuclear study. 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 No significant ischemia noted.     Daniel Bensimhon

## 2011-08-13 ENCOUNTER — Encounter: Payer: Self-pay | Admitting: Internal Medicine

## 2011-08-19 ENCOUNTER — Ambulatory Visit (INDEPENDENT_AMBULATORY_CARE_PROVIDER_SITE_OTHER): Payer: Medicare Other | Admitting: Internal Medicine

## 2011-08-19 ENCOUNTER — Encounter: Payer: Self-pay | Admitting: Internal Medicine

## 2011-08-19 DIAGNOSIS — I255 Ischemic cardiomyopathy: Secondary | ICD-10-CM

## 2011-08-19 DIAGNOSIS — I4891 Unspecified atrial fibrillation: Secondary | ICD-10-CM

## 2011-08-19 DIAGNOSIS — Z95810 Presence of automatic (implantable) cardiac defibrillator: Secondary | ICD-10-CM

## 2011-08-19 DIAGNOSIS — I509 Heart failure, unspecified: Secondary | ICD-10-CM

## 2011-08-19 DIAGNOSIS — I472 Ventricular tachycardia: Secondary | ICD-10-CM

## 2011-08-19 DIAGNOSIS — I2589 Other forms of chronic ischemic heart disease: Secondary | ICD-10-CM

## 2011-08-19 NOTE — Assessment & Plan Note (Signed)
Permanent on warfarin  goood rate cntrol

## 2011-08-19 NOTE — Assessment & Plan Note (Addendum)
Recurrent  Device reprogrammed to try and decrease likelihood of shock  He desire RX by the [place] so we will see PRN'

## 2011-08-19 NOTE — Assessment & Plan Note (Signed)
Stable on current meds;  Would recommend the intiaiation of aldosterone antagonism therapy

## 2011-08-19 NOTE — Progress Notes (Signed)
HPI  [name] is a 70 y.o. male Seen in followup for ICD implantation for primary prevention in the setting of ischemic heart disease with prior bypass surgery and depressed left ventricular function. He has permanent atrial fibrillation and is on Coumadin   He was seen ny [name] after being called by [place] that he had ICD threpay for VT.  He had no awareness and no symptoms  Myoview scanning 2010 demonstrating prior infarct with ejection fraction 25-30% with no ischemia ; this was repeated last month and is unchanged    Interrogation of his device demontrates ATP X2 for VT about 200 bpm in his VF zone  The patient denies chest pain, nocturnal dyspnea, orthopnea or peripheral edema.  He has chronic stable modest shortness of breath There have been no palpitations, lightheadedness or syncope.    Past Medical History  Diagnosis Date  . Ischemic cardiomyopathy      CABG 2003, PCI 2007  EF 30%(myoview 2010)  . CHF (congestive heart failure)     class I-II  . Atrial fibrillation     permanent  . Sleep apnea     ?  [name] Intestine disorder     blockage  . ICD (implantable cardiac defibrillator) in place   . Paroxysmal ventricular tachycardia     Rx  via ATP   . Hyperlipidemia   . Hypothyroidism     Past Surgical History  Procedure Date  . Coronary artery bypass graft   . Cardiac defibrillator placement     Current Outpatient Prescriptions  Medication Sig Dispense Refill  . albuterol (PROVENTIL) (2.5 MG/3ML) 0.083% nebulizer solution Take 2.5 mg by nebulization every 6 (six) hours as needed.        [name] aspirin 81 MG tablet Take 81 mg by mouth daily.        . carvedilol (COREG) 25 MG tablet Take 12.5 mg by mouth 2 (two) times daily with a meal.        . docusate sodium (COLACE) 100 MG capsule Take 100 mg by mouth 2 (two) times daily.        [name] levothyroxine (SYNTHROID, LEVOTHROID) 100 MCG tablet Take 100 mcg by mouth daily.        [name] lisinopril (PRINIVIL,ZESTRIL) 40 MG  tablet Take 20 mg by mouth daily.        . nitroGLYCERIN (NITROSTAT) 0.4 MG SL tablet Place 0.4 mg under the tongue every 5 (five) minutes as needed.        . NON FORMULARY OXYGEN 2 liters qhs prn        . ranitidine (ZANTAC) 150 MG tablet Take 150 mg by mouth 2 (two) times daily.        . simvastatin (ZOCOR) 80 MG tablet Take 40 mg by mouth at bedtime.        [name] tiotropium (SPIRIVA) 18 MCG inhalation capsule Place 18 mcg into inhaler and inhale daily.        [name] warfarin (COUMADIN) 5 MG tablet Take 5 mg by mouth daily.          No Known Allergies  Review of Systems negative except from HPI and PMH  Physical Exam Well developed and well nourished in no acute distress HENT normal E scleral and icterus clear Neck Supple JVP flat; carotids brisk and full Clear to ausculation Irregularly irregular rate and rhythm, no murmurs gallops or rub Soft with active bowel sounds No clubbing cyanosis and edema Alert and oriented, grossly normal motor  and sensory function Skin Warm and Dry    Assessment and  Plan

## 2011-08-19 NOTE — Patient Instructions (Signed)
Please continue to follow up with Dr. Elease Hashimoto as scheduled. Continue with the same medications. Follow up with Dr. Graciela Husbands as needed; the patient will be followed by the [facility] for ICD checks.

## 2011-08-19 NOTE — Assessment & Plan Note (Signed)
The patient's device was interrogated and the information was fully reviewed.  The device was reprogrammed to create two treatment zones with more ATP and longer detection times

## 2011-08-19 NOTE — Assessment & Plan Note (Addendum)
stabel classs2-3 symptoms  As above;  No evidence of volume overload

## 2011-08-29 ENCOUNTER — Encounter: Payer: Self-pay | Admitting: Internal Medicine

## 2011-11-04 ENCOUNTER — Telehealth: Payer: Self-pay | Admitting: *Deleted

## 2011-11-04 ENCOUNTER — Ambulatory Visit (INDEPENDENT_AMBULATORY_CARE_PROVIDER_SITE_OTHER): Payer: Medicare Other | Admitting: Nurse Practitioner

## 2011-11-04 ENCOUNTER — Ambulatory Visit
Admission: RE | Admit: 2011-11-04 | Discharge: 2011-11-04 | Disposition: A | Discharge status: Home / Self Care | Payer: Medicare Other | Source: Ambulatory Visit | Attending: Nurse Practitioner | Admitting: Nurse Practitioner

## 2011-11-04 ENCOUNTER — Encounter: Payer: Self-pay | Admitting: Nurse Practitioner

## 2011-11-04 VITALS — BP 118/78 | HR 66 | Ht 72.0 in | Wt 244.0 lb

## 2011-11-04 DIAGNOSIS — R0600 Dyspnea, unspecified: Secondary | ICD-10-CM

## 2011-11-04 DIAGNOSIS — I4891 Unspecified atrial fibrillation: Secondary | ICD-10-CM

## 2011-11-04 DIAGNOSIS — R0609 Other forms of dyspnea: Secondary | ICD-10-CM

## 2011-11-04 DIAGNOSIS — I2510 Atherosclerotic heart disease of native coronary artery without angina pectoris: Secondary | ICD-10-CM

## 2011-11-04 LAB — CBC
HCT: 39.3 % (ref 39.0–52.0)
Hemoglobin: 13.4 g/dL (ref 13.0–17.0)
MCHC: 34.1 g/dL (ref 30.0–36.0)
MCV: 93.8 fl (ref 78.0–100.0)
Platelets: 138 10*3/uL — ABNORMAL LOW (ref 150.0–400.0)
RBC: 4.19 Mil/uL — ABNORMAL LOW (ref 4.22–5.81)
RDW: 15.1 % — ABNORMAL HIGH (ref 11.5–14.6)
WBC: 8.8 10*3/uL (ref 4.5–10.5)

## 2011-11-04 LAB — BASIC METABOLIC PANEL
BUN: 14 mg/dL (ref 6–23)
CO2: 29 mEq/L (ref 19–32)
Calcium: 9.8 mg/dL (ref 8.4–10.5)
Chloride: 105 mEq/L (ref 96–112)
Creatinine, Ser: 0.8 mg/dL (ref 0.4–1.5)
GFR: 101.58 mL/min (ref >60.00)
Glucose, Bld: 110 mg/dL — ABNORMAL HIGH (ref 70–99)
Potassium: 3.8 mEq/L (ref 3.5–5.1)
Sodium: 139 mEq/L (ref 135–145)

## 2011-11-04 LAB — BRAIN NATRIURETIC PEPTIDE: Pro B Natriuretic peptide (BNP): 234 pg/mL — ABNORMAL HIGH (ref 0.0–100.0)

## 2011-11-04 MED ORDER — FUROSEMIDE 40 MG PO TABS: 40.0000 mg | ORAL_TABLET | Freq: Every day | ORAL | Status: DC

## 2011-11-04 NOTE — Progress Notes (Signed)
[name] Date of Birth: 04-Dec-1940 Medical Record #[id number]  History of Present Illness: [name] is seen today for a work in visit. He is seen for [name]. He is a 71 year old male with an ischemic cardiomyopathy. Has an ICD in place. Has PAF and is on coumadin. Has COPD and is on home oxygen at night.  He comes in today at the request of [name]. [name] had been in his usual state of health until yesterday. He does admit to doing some pretty heavy lifting earlier in the week. He got quite winded with moving appliances. Yesterday he woke up and was more short of breath. Typically he will recover fairly quickly when his breathing gets short, but yesterday he could not recover. His wife increased his oxygen up to 3 and 4 liters. He got better. Today he feels fine and is back to his baseline. He denies chest pain. No cough. No swelling. No bloating. Weight is unchanged. No fever or chills, however he thinks he had one day of fever about a week ago. His EKG yesterday was said to be abnormal. This has been reviewed by both Dr. Melburn Wood and [name]. No acute changes are noted. Today the patient feels fine. His last ischemic workup was just in October and showed an EF of 27%, large area of infarct but no ischemia. The LV is dilated. No ICD shocks.   Current Outpatient Prescriptions on File Prior to Visit  Medication Sig Dispense Refill  . albuterol (PROVENTIL) (2.5 MG/3ML) 0.083% nebulizer solution Take 2.5 mg by nebulization every 6 (six) hours as needed.        . docusate sodium (COLACE) 100 MG capsule Take 100 mg by mouth 2 (two) times daily.        [name] levothyroxine (SYNTHROID, LEVOTHROID) 100 MCG tablet Take 100 mcg by mouth daily.        [name] lisinopril (PRINIVIL,ZESTRIL) 40 MG tablet Take 20 mg by mouth daily.        . nitroGLYCERIN (NITROSTAT) 0.4 MG SL tablet Place 0.4 mg under the tongue every 5 (five) minutes as needed.        . NON FORMULARY OXYGEN 2 liters qhs prn   . ranitidine (ZANTAC) 150 MG tablet Take 150 mg by mouth 2 (two) times daily.        . simvastatin (ZOCOR) 80 MG tablet Take 40 mg by mouth at bedtime.        [name] tiotropium (SPIRIVA) 18 MCG inhalation capsule Place 18 mcg into inhaler and inhale daily.        [name] warfarin (COUMADIN) 5 MG tablet Take 5 mg by mouth as directed.       [name] DISCONTD: aspirin 81 MG tablet Take 81 mg by mouth daily.        [name] DISCONTD: carvedilol (COREG) 25 MG tablet Take 12.5 mg by mouth 2 (two) times daily with a meal.          No Known Allergies  Past Medical History  Diagnosis Date  . Ischemic cardiomyopathy      CABG 2003, PCI 2007  EF 27%(myoview 2012  . CHF (congestive heart failure)     class I-II  . Atrial fibrillation     paroxysmal; on coumadin  . Sleep apnea     ?  [name] Intestine disorder     blockage  . ICD (implantable cardiac defibrillator) in place   . Paroxysmal ventricular tachycardia  has ICD in place  . Hyperlipidemia   . Hypothyroidism   . Chronic anticoagulation   . Obesity   . COPD (chronic obstructive pulmonary disease)   . Asbestosis     Past Surgical History  Procedure Date  . Coronary artery bypass graft   . Cardiac defibrillator placement     History  Smoking status  . Former Smoker -- 2.0 packs/day for 45 years  . Types: Cigarettes  . Quit date: 11/11/2001  Smokeless tobacco  . Not on file    History  Alcohol Use No    Family History  Problem Relation Age of Onset  . Heart attack Mother   . Heart attack Father     Review of Systems: The review of systems is per the HPI.  All other systems were reviewed and are negative.  Physical Exam: BP 118/78  Pulse 66  Ht 6' (1.829 m)  Wt 244 lb (110.678 kg)  BMI 33.09 kg/m2  SpO2 84% Patient is very pleasant and in no acute distress. He is breathing on RA. Sats are in the low 90's but did drop to 84% with lying back just briefly. He is obese. Skin is warm and dry. Color is normal. He has several tattoos. HEENT  is unremarkable. Normocephalic/atraumatic. PERRL. Sclera are nonicteric. Neck is supple. No masses. No JVD. Lungs are clear. Cardiac exam shows a regular rate and rhythm. 2/6 systolic murmur noted. Abdomen is obese but soft. Extremities are without edema. Gait and ROM are intact. No gross neurologic deficits noted.   LABORATORY DATA: EKG here today shows sinus, PVC's and anterolateral T wave changes. This tracing does appear improved over the prior tracing here in October.    Assessment / Plan:

## 2011-11-04 NOTE — Assessment & Plan Note (Signed)
He is in sinus rhythm today. He remains on his coumadin.

## 2011-11-04 NOTE — Telephone Encounter (Signed)
Dr Shirlee Latch took DOD call today about pt's SOB. Pt given appt to see Sunday Spillers today.

## 2011-11-04 NOTE — Patient Instructions (Addendum)
We are going to check some labs today.  I am going to send you for a chest xray today  Use your oxygen as needed. You may need to start using it more.   I am going to send a prescription for some Lasix. Take one a day for the next 3 days and then as needed.  Call the [facility] office at [phone] if you have any questions, problems or concerns.

## 2011-11-04 NOTE — Assessment & Plan Note (Signed)
Has had prior CABG. Has had recent myoview. No chest pain reported.

## 2011-11-04 NOTE — Assessment & Plan Note (Signed)
Not sure what to make of his symptoms yesterday. They are now totally resolved per the patient's report. I have talked with Dr. Shirlee Latch regarding [name]'s care. He has had a recent myoview showing large scar, but no ischemia with an EF of 27% from October. He is not on diuretic. We will check a BNP today. Send him for a CXR. Will give him 3 days of Lasix 40 mg. He may just have overexerted himself from earlier in the week and doesn't have a lot of reserve to fall back on. Have encouraged him to use his oxygen as needed, even in the daytime. Patient is agreeable to this plan and will call if any problems develop in the interim.

## 2011-12-09 ENCOUNTER — Encounter: Payer: Self-pay | Admitting: *Deleted

## 2011-12-16 ENCOUNTER — Encounter: Payer: Self-pay | Admitting: Cardiovascular Disease

## 2011-12-16 ENCOUNTER — Ambulatory Visit (INDEPENDENT_AMBULATORY_CARE_PROVIDER_SITE_OTHER): Payer: Medicare Other | Admitting: Cardiovascular Disease

## 2011-12-16 VITALS — BP 113/71 | HR 65 | Ht 72.0 in | Wt 249.1 lb

## 2011-12-16 DIAGNOSIS — I4891 Unspecified atrial fibrillation: Secondary | ICD-10-CM

## 2011-12-16 NOTE — Assessment & Plan Note (Signed)
Markees remains fairly stable. He'll continue with his same medications. He avoids eating any exercise. I've asked him to exercise at a moderate level.

## 2011-12-16 NOTE — Patient Instructions (Signed)
Your physician wants you to follow-up in: 6 months You will receive a reminder letter in the mail two months in advance. If you don't receive a letter, please call our office to schedule the follow-up appointment.  Your physician recommends that you return for a FASTING lipid profile: bnp/bmet/lipids/hepatic

## 2011-12-16 NOTE — Progress Notes (Signed)
[name] Date of Birth  1941/04/01 [facility] [address]     [address] [place], [place]  [address] [phone]  Fax  [phone]   Problem list: 1. Coronary artery disease-status post CABG in 2003 2. Congestive heart failure-EF equals 32% 3. AICD 4. Hyperlipidemia 5. Hypothyroidism 6. Intermittent atrial fibrillation-on chronic Coumadin therapy  History of Present Illness:  [name] is a 71 year old gentleman with a history of coronary artery disease-status post coronary artery bypass grafting in 2003. He has history of congestive heart failure with an ejection fraction of 32%. He has an AICD and had a generator replaced in April 2001. He also has a history of hyperlipidemia, hypothyroidism and intermittent atrial fibrillation. He is on chronic Coumadin therapy.  He gets all of his medications through the [facility].  He is breathing better.  He wakes up with night sweats frequently.  He remains active.     Current Outpatient Prescriptions on File Prior to Visit  Medication Sig Dispense Refill  . albuterol (PROVENTIL) (2.5 MG/3ML) 0.083% nebulizer solution Take 2.5 mg by nebulization every 6 (six) hours as needed.        . carvedilol (COREG) 12.5 MG tablet Take 12.5 mg by mouth 2 (two) times daily with a meal.      . docusate sodium (COLACE) 100 MG capsule Take 100 mg by mouth 2 (two) times daily.        . furosemide (LASIX) 40 MG tablet Take 1 tablet (40 mg total) by mouth daily.  30 tablet  11  . levothyroxine (SYNTHROID, LEVOTHROID) 100 MCG tablet Take 100 mcg by mouth daily.        [name] lisinopril (PRINIVIL,ZESTRIL) 40 MG tablet Take 20 mg by mouth daily.        . nitroGLYCERIN (NITROSTAT) 0.4 MG SL tablet Place 0.4 mg under the tongue every 5 (five) minutes as needed.        . NON FORMULARY OXYGEN 2 liters qhs prn        . ranitidine (ZANTAC) 150 MG tablet Take 150 mg by mouth 2 (two) times daily.        . simvastatin  (ZOCOR) 80 MG tablet Take 40 mg by mouth at bedtime.        [name] tiotropium (SPIRIVA) 18 MCG inhalation capsule Place 18 mcg into inhaler and inhale daily.        [name] warfarin (COUMADIN) 5 MG tablet Take 5 mg by mouth as directed.       [name] DISCONTD: aspirin 81 MG tablet Take 81 mg by mouth daily.          No Known Allergies  Past Medical History  Diagnosis Date  . Ischemic cardiomyopathy      CABG 2003, PCI 2007  EF 27%(myoview 2012  . CHF (congestive heart failure)     class I-II  . Atrial fibrillation     paroxysmal; on coumadin  . Sleep apnea     ?  [name] Intestine disorder     blockage  . ICD (implantable cardiac defibrillator) in place   . Paroxysmal ventricular tachycardia     has ICD in place  . Hyperlipidemia   . Hypothyroidism   . Chronic anticoagulation   . Obesity   . COPD (chronic obstructive pulmonary disease)   . Asbestosis     Past Surgical History  Procedure Date  . Coronary artery bypass graft   . Cardiac defibrillator placement  History  Smoking status  . Former Smoker -- 2.0 packs/day for 45 years  . Types: Cigarettes  . Quit date: 11/11/2001  Smokeless tobacco  . Not on file    History  Alcohol Use No    Family History  Problem Relation Age of Onset  . Heart attack Mother   . Heart attack Father     Reviw of Systems:  Reviewed in the HPI.  All other systems are negative.  Physical Exam: BP 113/71  Pulse 65  Ht 6' (1.829 m)  Wt 249 lb 1.9 oz (113 kg)  BMI 33.79 kg/m2 The patient is alert and oriented x 3.  The mood and affect are normal.   Skin: warm and dry.  Color is normal.    HEENT:   the sclera are nonicteric.  The mucous membranes are moist.  The carotids are 2+ without bruits.  There is no thyromegaly.  There is no JVD.    Lungs: clear.  The chest wall is non tender.    Heart: regular rate with a normal S1 and S2.  There are no murmurs, gallops, or rubs. The PMI is not displaced.     Abdomen: good bowel sounds.  There is no  guarding or rebound.  There is no hepatosplenomegaly or tenderness.  There are no masses.   Extremities:  no clubbing, cyanosis, or edema.  The legs are without rashes.  The distal pulses are intact.   Neuro:  Cranial nerves II - XII are intact.  Motor and sensory functions are intact.    The gait is normal.  Assessment / Plan:

## 2012-01-21 ENCOUNTER — Encounter (HOSPITAL_COMMUNITY): Payer: Self-pay

## 2012-01-21 ENCOUNTER — Encounter (HOSPITAL_COMMUNITY)
Admission: RE | Admit: 2012-01-21 | Discharge: 2012-01-21 | Disposition: A | Discharge status: Home / Self Care | Payer: Medicare Other | Source: Ambulatory Visit | Attending: Internal Medicine | Admitting: Internal Medicine

## 2012-01-21 DIAGNOSIS — E669 Obesity, unspecified: Secondary | ICD-10-CM | POA: Insufficient documentation

## 2012-01-21 DIAGNOSIS — Z95810 Presence of automatic (implantable) cardiac defibrillator: Secondary | ICD-10-CM | POA: Insufficient documentation

## 2012-01-21 DIAGNOSIS — I4729 Other ventricular tachycardia: Secondary | ICD-10-CM | POA: Insufficient documentation

## 2012-01-21 DIAGNOSIS — J449 Chronic obstructive pulmonary disease, unspecified: Secondary | ICD-10-CM | POA: Insufficient documentation

## 2012-01-21 DIAGNOSIS — E039 Hypothyroidism, unspecified: Secondary | ICD-10-CM | POA: Insufficient documentation

## 2012-01-21 DIAGNOSIS — Z951 Presence of aortocoronary bypass graft: Secondary | ICD-10-CM | POA: Insufficient documentation

## 2012-01-21 DIAGNOSIS — I4891 Unspecified atrial fibrillation: Secondary | ICD-10-CM | POA: Insufficient documentation

## 2012-01-21 DIAGNOSIS — I509 Heart failure, unspecified: Secondary | ICD-10-CM | POA: Insufficient documentation

## 2012-01-21 DIAGNOSIS — G4730 Sleep apnea, unspecified: Secondary | ICD-10-CM | POA: Insufficient documentation

## 2012-01-21 DIAGNOSIS — E785 Hyperlipidemia, unspecified: Secondary | ICD-10-CM | POA: Insufficient documentation

## 2012-01-21 DIAGNOSIS — I2510 Atherosclerotic heart disease of native coronary artery without angina pectoris: Secondary | ICD-10-CM | POA: Insufficient documentation

## 2012-01-21 DIAGNOSIS — I2589 Other forms of chronic ischemic heart disease: Secondary | ICD-10-CM | POA: Insufficient documentation

## 2012-01-21 DIAGNOSIS — Z5189 Encounter for other specified aftercare: Secondary | ICD-10-CM | POA: Insufficient documentation

## 2012-01-21 DIAGNOSIS — I472 Ventricular tachycardia, unspecified: Secondary | ICD-10-CM | POA: Insufficient documentation

## 2012-01-21 DIAGNOSIS — J4489 Other specified chronic obstructive pulmonary disease: Secondary | ICD-10-CM | POA: Insufficient documentation

## 2012-01-21 NOTE — Progress Notes (Signed)
Mr Hannen came in today, accompanied by wife, for orientation to Pulmonary Rehab.  His chart was reviewed, medications reviewed and nurse assessment done. Demonstration and practice of PLB using pulse oximeter.  Patient able to return demonstration satisfactorily. Safety and hand hygiene in the exercise area reviewed with patient.  Patient voices understanding.  6 min walk test done, oxygen saturations were stable 92-93 % throughout the test.  He plans to start exercise on 01-23-12.

## 2012-01-23 ENCOUNTER — Encounter (HOSPITAL_COMMUNITY): Payer: Medicare Other

## 2012-01-28 ENCOUNTER — Encounter (HOSPITAL_COMMUNITY)
Admission: RE | Admit: 2012-01-28 | Discharge: 2012-01-28 | Disposition: A | Discharge status: Home / Self Care | Payer: Medicare Other | Source: Ambulatory Visit | Attending: Internal Medicine | Admitting: Internal Medicine

## 2012-01-30 ENCOUNTER — Encounter (HOSPITAL_COMMUNITY)
Admission: RE | Admit: 2012-01-30 | Discharge: 2012-01-30 | Disposition: A | Discharge status: Home / Self Care | Payer: Medicare Other | Source: Ambulatory Visit | Attending: Internal Medicine | Admitting: Internal Medicine

## 2012-01-30 DIAGNOSIS — J4489 Other specified chronic obstructive pulmonary disease: Secondary | ICD-10-CM | POA: Insufficient documentation

## 2012-01-30 DIAGNOSIS — I2589 Other forms of chronic ischemic heart disease: Secondary | ICD-10-CM | POA: Insufficient documentation

## 2012-01-30 DIAGNOSIS — Z95810 Presence of automatic (implantable) cardiac defibrillator: Secondary | ICD-10-CM | POA: Insufficient documentation

## 2012-01-30 DIAGNOSIS — Z5189 Encounter for other specified aftercare: Secondary | ICD-10-CM | POA: Insufficient documentation

## 2012-01-30 DIAGNOSIS — Z951 Presence of aortocoronary bypass graft: Secondary | ICD-10-CM | POA: Insufficient documentation

## 2012-01-30 DIAGNOSIS — I472 Ventricular tachycardia, unspecified: Secondary | ICD-10-CM | POA: Insufficient documentation

## 2012-01-30 DIAGNOSIS — I4891 Unspecified atrial fibrillation: Secondary | ICD-10-CM | POA: Insufficient documentation

## 2012-01-30 DIAGNOSIS — E669 Obesity, unspecified: Secondary | ICD-10-CM | POA: Insufficient documentation

## 2012-01-30 DIAGNOSIS — I2510 Atherosclerotic heart disease of native coronary artery without angina pectoris: Secondary | ICD-10-CM | POA: Insufficient documentation

## 2012-01-30 DIAGNOSIS — J449 Chronic obstructive pulmonary disease, unspecified: Secondary | ICD-10-CM | POA: Insufficient documentation

## 2012-01-30 DIAGNOSIS — I4729 Other ventricular tachycardia: Secondary | ICD-10-CM | POA: Insufficient documentation

## 2012-01-30 DIAGNOSIS — G4730 Sleep apnea, unspecified: Secondary | ICD-10-CM | POA: Insufficient documentation

## 2012-01-30 DIAGNOSIS — I509 Heart failure, unspecified: Secondary | ICD-10-CM | POA: Insufficient documentation

## 2012-01-30 DIAGNOSIS — E785 Hyperlipidemia, unspecified: Secondary | ICD-10-CM | POA: Insufficient documentation

## 2012-01-30 DIAGNOSIS — E039 Hypothyroidism, unspecified: Secondary | ICD-10-CM | POA: Insufficient documentation

## 2012-02-04 ENCOUNTER — Encounter (HOSPITAL_COMMUNITY)
Admission: RE | Admit: 2012-02-04 | Discharge: 2012-02-04 | Disposition: A | Discharge status: Home / Self Care | Payer: Medicare Other | Source: Ambulatory Visit | Attending: Internal Medicine | Admitting: Internal Medicine

## 2012-02-06 ENCOUNTER — Encounter (HOSPITAL_COMMUNITY)
Admission: RE | Admit: 2012-02-06 | Discharge: 2012-02-06 | Disposition: A | Discharge status: Home / Self Care | Payer: Medicare Other | Source: Ambulatory Visit | Attending: Internal Medicine | Admitting: Internal Medicine

## 2012-02-11 ENCOUNTER — Encounter (HOSPITAL_COMMUNITY)
Admission: RE | Admit: 2012-02-11 | Discharge: 2012-02-11 | Disposition: A | Discharge status: Home / Self Care | Payer: Medicare Other | Source: Ambulatory Visit | Attending: Internal Medicine | Admitting: Internal Medicine

## 2012-02-13 ENCOUNTER — Encounter (HOSPITAL_COMMUNITY): Payer: Medicare Other

## 2012-02-18 ENCOUNTER — Encounter (HOSPITAL_COMMUNITY)
Admission: RE | Admit: 2012-02-18 | Discharge: 2012-02-18 | Disposition: A | Discharge status: Home / Self Care | Payer: Medicare Other | Source: Ambulatory Visit | Attending: Internal Medicine | Admitting: Internal Medicine

## 2012-02-20 ENCOUNTER — Encounter (HOSPITAL_COMMUNITY)
Admission: RE | Admit: 2012-02-20 | Discharge: 2012-02-20 | Disposition: A | Discharge status: Home / Self Care | Payer: Medicare Other | Source: Ambulatory Visit | Attending: Internal Medicine | Admitting: Internal Medicine

## 2012-02-20 NOTE — Progress Notes (Signed)
[name] 71 y.o. male Nutrition Note Spoke with pt. Pt is obese. Pt states he wants to lose wt down to 200 lbs and has not changed any of his eating habits to promote wt loss. Pt wt is down 7 lbs over the past 6 months. Wt loss desired and rate of wt loss appears safe. Wt loss tips briefly reviewed. Pt eats 2 meals a day; most prepared at home.  There are some ways pt can make his diet healthier.  Pt's Rate Your Plate results reviewed with pt.  Pt expressed understanding.  Pt does not avoid salty food; uses canned/ convenience (Stouffer's meals) food.  Pt adds salt to food. Per discussion, pt is aware of the need to watch his sodium intake and pt is aware of foods that he eats that are high in sodium. Pt has not chosen to change his diet at this time to reduce sodium intake. The role of sodium in lung disease reviewed with pt. Pt expressed understanding. Nutrition Diagnosis   Excessive sodium intake related to over consumption of processed food as evidenced by frequent consumption of convenience food/ canned vegetables   Food-and nutrition-related knowledge deficit related to lack of exposure to information as related to diagnosis of pulmonary disease   Obesity related to excessive energy intake as evidenced by a BMI of 33.9    Nutrition Rx/Est. Daily Nutrition Needs for: ? wt loss gain  maintenance 1700-2200 Kcal  90-110 gm protein   1500 mg or less sodium    Nutrition Intervention   Pt's individual nutrition plan and goals reviewed with pt.   Benefits of adopting healthy eating habits discussed when pt's Rate Your Plate reviewed.   Pt to attend the Nutrition and Lung Disease class   Continual client-centered nutrition education by RD, as part of interdisciplinary care. Goal(s) 1. Pt to limit food sources of sodium. 2. Identify food quantities necessary to achieve wt loss of  -2# per week to a goal wt of 100.9-106.4 kg (222-234 lb) at graduation from pulmonary rehab. 3. Describe the  benefit of including fruits, vegetables, whole grains, and low-fat dairy products in a healthy meal plan. Monitor and Evaluate progress toward nutrition goal with team.   Pulmonary Rehab Nutrition Screen  [name] 71 y.o. male             Ht: 71.5" Ht Readings from Last 1 Encounters:  12/16/11 6' (1.829 m)    Wt:   246.2lb (111.9 kg) Wt Readings from Last 3 Encounters:  12/16/11 249 lb 1.9 oz (113 kg)  11/04/11 244 lb (110.678 kg)  08/19/11 253 lb 8 oz (114.987 kg)    BMI: 33.9  30.8%body fat                       Rate Your Plate Score: 50  Please answer the following questions:             YES  NO    Do you live in a nursing home?  x   Do you eat out more than 3 times per week?    x If yes, how many times per week do you eat out?   Do you have food allergies?   x If yes, what are you allergic to?  Have you gained or lost more than 10 lbs without trying?               x If yes, how much weight have  you  lost or gained?  lbs over  weeks/mo   Do you want to lose weight?    x  If yes, what is a goal weight or amount of weight you would like to lose? 200 lbs  Do you eat alone most of the time?   x   Do you eat less than 2 meals/day?  x If yes, how many meals do you eat? 2  Do you use canned and convenience food? x  Stouffer's meals frequently  Do you use a salt shaker? x    Do you drink more than 3 alcoholic drinks/day?  x If yes, how many drinks per day?  Are you having trouble with constipation? *  x If yes, what are you doing to help relieve constipation?  Do you have financial difficulties with buying food? *  x Wife does the shopping.  Do you usually need help with grocery shopping or with cooking? *  x Wife does the cooking.  Do you have a poor appetite? *                                       x   Do you have trouble chewing/ swallowing? *   x   Do you take vitamin and mineral or herbal supplements? *  x If yes, what kind of supplements do you currently take?    Past  Medical History  Diagnosis Date  . Ischemic cardiomyopathy      CABG 2003, PCI 2007  EF 27%(myoview 2012  . CHF (congestive heart failure)     class I-II  . Sleep apnea     ?  [name] Intestine disorder     blockage  . ICD (implantable cardiac defibrillator) in place   . Hyperlipidemia   . Hypothyroidism   . Chronic anticoagulation   . Obesity   . COPD (chronic obstructive pulmonary disease)   . Asbestosis   . Atrial fibrillation     paroxysmal; on coumadin  . Ventricular tachycardia, inducible     has ICD in place  . Coronary artery disease    Labs Lipids Lipid Panel  No results found for this basename: chol, trig, hdl, cholhdl, vldl, ldlcalc   No results found for this basename: HGBA1C   Nutrition Risk Level: Moderate

## 2012-02-25 ENCOUNTER — Encounter (HOSPITAL_COMMUNITY)
Admission: RE | Admit: 2012-02-25 | Discharge: 2012-02-25 | Disposition: A | Discharge status: Home / Self Care | Payer: Medicare Other | Source: Ambulatory Visit | Attending: Internal Medicine | Admitting: Internal Medicine

## 2012-02-27 ENCOUNTER — Encounter (HOSPITAL_COMMUNITY)
Admission: RE | Admit: 2012-02-27 | Discharge: 2012-02-27 | Disposition: A | Discharge status: Home / Self Care | Payer: Medicare Other | Source: Ambulatory Visit | Attending: Internal Medicine | Admitting: Internal Medicine

## 2012-03-03 ENCOUNTER — Encounter (HOSPITAL_COMMUNITY)
Admission: RE | Admit: 2012-03-03 | Discharge: 2012-03-03 | Disposition: A | Discharge status: Home / Self Care | Payer: Medicare Other | Source: Ambulatory Visit | Attending: Internal Medicine | Admitting: Internal Medicine

## 2012-03-03 DIAGNOSIS — Z5189 Encounter for other specified aftercare: Secondary | ICD-10-CM | POA: Insufficient documentation

## 2012-03-03 DIAGNOSIS — E039 Hypothyroidism, unspecified: Secondary | ICD-10-CM | POA: Insufficient documentation

## 2012-03-03 DIAGNOSIS — G4730 Sleep apnea, unspecified: Secondary | ICD-10-CM | POA: Insufficient documentation

## 2012-03-03 DIAGNOSIS — I472 Ventricular tachycardia, unspecified: Secondary | ICD-10-CM | POA: Insufficient documentation

## 2012-03-03 DIAGNOSIS — I2510 Atherosclerotic heart disease of native coronary artery without angina pectoris: Secondary | ICD-10-CM | POA: Insufficient documentation

## 2012-03-03 DIAGNOSIS — Z951 Presence of aortocoronary bypass graft: Secondary | ICD-10-CM | POA: Insufficient documentation

## 2012-03-03 DIAGNOSIS — I4891 Unspecified atrial fibrillation: Secondary | ICD-10-CM | POA: Insufficient documentation

## 2012-03-03 DIAGNOSIS — J449 Chronic obstructive pulmonary disease, unspecified: Secondary | ICD-10-CM | POA: Insufficient documentation

## 2012-03-03 DIAGNOSIS — I509 Heart failure, unspecified: Secondary | ICD-10-CM | POA: Insufficient documentation

## 2012-03-03 DIAGNOSIS — E785 Hyperlipidemia, unspecified: Secondary | ICD-10-CM | POA: Insufficient documentation

## 2012-03-03 DIAGNOSIS — I4729 Other ventricular tachycardia: Secondary | ICD-10-CM | POA: Insufficient documentation

## 2012-03-03 DIAGNOSIS — I2589 Other forms of chronic ischemic heart disease: Secondary | ICD-10-CM | POA: Insufficient documentation

## 2012-03-03 DIAGNOSIS — J4489 Other specified chronic obstructive pulmonary disease: Secondary | ICD-10-CM | POA: Insufficient documentation

## 2012-03-03 DIAGNOSIS — Z95810 Presence of automatic (implantable) cardiac defibrillator: Secondary | ICD-10-CM | POA: Insufficient documentation

## 2012-03-03 DIAGNOSIS — E669 Obesity, unspecified: Secondary | ICD-10-CM | POA: Insufficient documentation

## 2012-03-05 ENCOUNTER — Encounter (HOSPITAL_COMMUNITY)
Admission: RE | Admit: 2012-03-05 | Discharge: 2012-03-05 | Disposition: A | Discharge status: Home / Self Care | Payer: Medicare Other | Source: Ambulatory Visit | Attending: Internal Medicine | Admitting: Internal Medicine

## 2012-03-05 NOTE — Progress Notes (Signed)
Completed home exercise with patient. Reviewed exercise progression, routine, exercising at a comfortable pace, RPE/Dyspnea scales, how important it is to own a pulse oximeter and how to use one, weather conditions, warning signs and symptoms with exercise, and CP/NTG. We discussed when to call MD. Patient voices understanding. Patient has a goal to continue having a good quality of life as well as feeling better on most days of the week . Will continue to encourage and support.  Kirsty Monjaraz L. Manson Passey, MS, NASM, CES

## 2012-03-10 ENCOUNTER — Encounter (HOSPITAL_COMMUNITY)
Admission: RE | Admit: 2012-03-10 | Discharge: 2012-03-10 | Disposition: A | Discharge status: Home / Self Care | Payer: Medicare Other | Source: Ambulatory Visit | Attending: Internal Medicine | Admitting: Internal Medicine

## 2012-03-12 ENCOUNTER — Encounter (HOSPITAL_COMMUNITY)
Admission: RE | Admit: 2012-03-12 | Discharge: 2012-03-12 | Disposition: A | Discharge status: Home / Self Care | Payer: Medicare Other | Source: Ambulatory Visit | Attending: Internal Medicine | Admitting: Internal Medicine

## 2012-03-17 ENCOUNTER — Encounter (HOSPITAL_COMMUNITY)
Admission: RE | Admit: 2012-03-17 | Discharge: 2012-03-17 | Disposition: A | Discharge status: Home / Self Care | Payer: Medicare Other | Source: Ambulatory Visit | Attending: Internal Medicine | Admitting: Internal Medicine

## 2012-03-19 ENCOUNTER — Encounter (HOSPITAL_COMMUNITY)
Admission: RE | Admit: 2012-03-19 | Discharge: 2012-03-19 | Disposition: A | Discharge status: Home / Self Care | Payer: Medicare Other | Source: Ambulatory Visit | Attending: Internal Medicine | Admitting: Internal Medicine

## 2012-03-24 ENCOUNTER — Encounter (HOSPITAL_COMMUNITY)
Admission: RE | Admit: 2012-03-24 | Discharge: 2012-03-24 | Disposition: A | Discharge status: Home / Self Care | Payer: Medicare Other | Source: Ambulatory Visit | Attending: Internal Medicine | Admitting: Internal Medicine

## 2012-03-26 ENCOUNTER — Encounter (HOSPITAL_COMMUNITY): Payer: Medicare Other

## 2012-03-31 ENCOUNTER — Encounter (HOSPITAL_COMMUNITY)
Admission: RE | Admit: 2012-03-31 | Discharge: 2012-03-31 | Disposition: A | Discharge status: Home / Self Care | Payer: Medicare Other | Source: Ambulatory Visit | Attending: Internal Medicine | Admitting: Internal Medicine

## 2012-03-31 DIAGNOSIS — I472 Ventricular tachycardia, unspecified: Secondary | ICD-10-CM | POA: Insufficient documentation

## 2012-03-31 DIAGNOSIS — J4489 Other specified chronic obstructive pulmonary disease: Secondary | ICD-10-CM | POA: Insufficient documentation

## 2012-03-31 DIAGNOSIS — Z951 Presence of aortocoronary bypass graft: Secondary | ICD-10-CM | POA: Insufficient documentation

## 2012-03-31 DIAGNOSIS — E669 Obesity, unspecified: Secondary | ICD-10-CM | POA: Insufficient documentation

## 2012-03-31 DIAGNOSIS — I4729 Other ventricular tachycardia: Secondary | ICD-10-CM | POA: Insufficient documentation

## 2012-03-31 DIAGNOSIS — I509 Heart failure, unspecified: Secondary | ICD-10-CM | POA: Insufficient documentation

## 2012-03-31 DIAGNOSIS — I4891 Unspecified atrial fibrillation: Secondary | ICD-10-CM | POA: Insufficient documentation

## 2012-03-31 DIAGNOSIS — E039 Hypothyroidism, unspecified: Secondary | ICD-10-CM | POA: Insufficient documentation

## 2012-03-31 DIAGNOSIS — E785 Hyperlipidemia, unspecified: Secondary | ICD-10-CM | POA: Insufficient documentation

## 2012-03-31 DIAGNOSIS — Z5189 Encounter for other specified aftercare: Secondary | ICD-10-CM | POA: Insufficient documentation

## 2012-03-31 DIAGNOSIS — J449 Chronic obstructive pulmonary disease, unspecified: Secondary | ICD-10-CM | POA: Insufficient documentation

## 2012-03-31 DIAGNOSIS — I2589 Other forms of chronic ischemic heart disease: Secondary | ICD-10-CM | POA: Insufficient documentation

## 2012-03-31 DIAGNOSIS — G4730 Sleep apnea, unspecified: Secondary | ICD-10-CM | POA: Insufficient documentation

## 2012-03-31 DIAGNOSIS — I2510 Atherosclerotic heart disease of native coronary artery without angina pectoris: Secondary | ICD-10-CM | POA: Insufficient documentation

## 2012-03-31 DIAGNOSIS — Z95810 Presence of automatic (implantable) cardiac defibrillator: Secondary | ICD-10-CM | POA: Insufficient documentation

## 2012-04-02 ENCOUNTER — Encounter (HOSPITAL_COMMUNITY): Payer: Medicare Other

## 2012-04-07 ENCOUNTER — Encounter (HOSPITAL_COMMUNITY)
Admission: RE | Admit: 2012-04-07 | Discharge: 2012-04-07 | Disposition: A | Discharge status: Home / Self Care | Payer: Medicare Other | Source: Ambulatory Visit | Attending: Internal Medicine | Admitting: Internal Medicine

## 2012-04-07 ENCOUNTER — Telehealth: Payer: Self-pay | Admitting: Internal Medicine

## 2012-04-07 NOTE — Telephone Encounter (Signed)
04-07-12 PT HAVING ICD CHECKS BY [facility]

## 2012-04-09 ENCOUNTER — Encounter (HOSPITAL_COMMUNITY)
Admission: RE | Admit: 2012-04-09 | Discharge: 2012-04-09 | Disposition: A | Discharge status: Home / Self Care | Payer: Medicare Other | Source: Ambulatory Visit | Attending: Internal Medicine | Admitting: Internal Medicine

## 2012-04-14 ENCOUNTER — Encounter (HOSPITAL_COMMUNITY)
Admission: RE | Admit: 2012-04-14 | Discharge: 2012-04-14 | Disposition: A | Discharge status: Home / Self Care | Payer: Medicare Other | Source: Ambulatory Visit | Attending: Internal Medicine | Admitting: Internal Medicine

## 2012-04-16 ENCOUNTER — Encounter (HOSPITAL_COMMUNITY): Payer: Medicare Other

## 2012-04-21 ENCOUNTER — Encounter (HOSPITAL_COMMUNITY): Payer: Medicare Other

## 2012-04-23 ENCOUNTER — Encounter (HOSPITAL_COMMUNITY): Payer: Medicare Other

## 2012-04-28 ENCOUNTER — Encounter (HOSPITAL_COMMUNITY): Payer: Medicare Other

## 2012-04-30 ENCOUNTER — Encounter (HOSPITAL_COMMUNITY): Payer: Medicare Other

## 2012-05-05 ENCOUNTER — Encounter (HOSPITAL_COMMUNITY): Payer: Medicare Other

## 2012-05-07 ENCOUNTER — Encounter (HOSPITAL_COMMUNITY): Payer: Medicare Other

## 2012-05-12 ENCOUNTER — Encounter (HOSPITAL_COMMUNITY): Payer: Medicare Other

## 2012-07-02 ENCOUNTER — Encounter: Payer: Self-pay | Admitting: Internal Medicine

## 2012-07-02 ENCOUNTER — Encounter: Payer: Self-pay | Admitting: Cardiovascular Disease

## 2012-07-02 ENCOUNTER — Ambulatory Visit (INDEPENDENT_AMBULATORY_CARE_PROVIDER_SITE_OTHER): Payer: Medicare Other | Admitting: Cardiovascular Disease

## 2012-07-02 ENCOUNTER — Ambulatory Visit (INDEPENDENT_AMBULATORY_CARE_PROVIDER_SITE_OTHER): Payer: Medicare Other | Admitting: *Deleted

## 2012-07-02 ENCOUNTER — Encounter (INDEPENDENT_AMBULATORY_CARE_PROVIDER_SITE_OTHER): Payer: Medicare Other | Admitting: *Deleted

## 2012-07-02 VITALS — BP 107/61 | HR 72 | Ht 72.0 in | Wt 237.0 lb

## 2012-07-02 DIAGNOSIS — I4891 Unspecified atrial fibrillation: Secondary | ICD-10-CM

## 2012-07-02 DIAGNOSIS — I255 Ischemic cardiomyopathy: Secondary | ICD-10-CM

## 2012-07-02 DIAGNOSIS — I472 Ventricular tachycardia, unspecified: Secondary | ICD-10-CM

## 2012-07-02 DIAGNOSIS — I2510 Atherosclerotic heart disease of native coronary artery without angina pectoris: Secondary | ICD-10-CM

## 2012-07-02 DIAGNOSIS — R0989 Other specified symptoms and signs involving the circulatory and respiratory systems: Secondary | ICD-10-CM

## 2012-07-02 DIAGNOSIS — G4733 Obstructive sleep apnea (adult) (pediatric): Secondary | ICD-10-CM | POA: Insufficient documentation

## 2012-07-02 DIAGNOSIS — I509 Heart failure, unspecified: Secondary | ICD-10-CM

## 2012-07-02 DIAGNOSIS — I428 Other cardiomyopathies: Secondary | ICD-10-CM

## 2012-07-02 DIAGNOSIS — R0600 Dyspnea, unspecified: Secondary | ICD-10-CM

## 2012-07-02 DIAGNOSIS — Z79899 Other long term (current) drug therapy: Secondary | ICD-10-CM

## 2012-07-02 DIAGNOSIS — I2589 Other forms of chronic ischemic heart disease: Secondary | ICD-10-CM

## 2012-07-02 LAB — ICD DEVICE OBSERVATION
CHARGE TIME: 9.118 s
DEV-0020ICD: NEGATIVE
FVT: 0
RV LEAD AMPLITUDE: 9.625 mv
RV LEAD IMPEDENCE ICD: 399 Ohm
RV LEAD THRESHOLD: 1 V
TOT-0001: 0
TOT-0002: 4
TOT-0006: 20110428000000
TZAT-0001FASTVT: 1
TZAT-0002FASTVT: NEGATIVE
TZAT-0004SLOWVT: 8
TZAT-0004SLOWVT: 8
TZAT-0005SLOWVT: 88 pct
TZAT-0005SLOWVT: 91 pct
TZAT-0011SLOWVT: 10 ms
TZAT-0012SLOWVT: 200 ms
TZAT-0012SLOWVT: 200 ms
TZAT-0013SLOWVT: 3
TZAT-0013SLOWVT: 3
TZAT-0018FASTVT: NEGATIVE
TZAT-0018SLOWVT: NEGATIVE
TZAT-0019FASTVT: 8 V
TZON-0003SLOWVT: 310 ms
TZON-0003VSLOWVT: 410 ms
TZON-0004SLOWVT: 40
TZON-0005SLOWVT: 24
TZST-0001FASTVT: 2
TZST-0001FASTVT: 3
TZST-0001FASTVT: 4
TZST-0001FASTVT: 6
TZST-0001SLOWVT: 4
TZST-0002FASTVT: NEGATIVE
TZST-0002FASTVT: NEGATIVE
TZST-0003SLOWVT: 30 J

## 2012-07-02 NOTE — Progress Notes (Signed)
[name] Date of Birth  1940/10/16 [facility] [address]     [address] [place], [place]  [address] [phone]  Fax  [phone]   Problem list: 1. Coronary artery disease-status post CABG in 2003 2. Congestive heart failure-EF equals 32% 3. AICD 4. Hyperlipidemia 5. Hypothyroidism 6. atrial fibrillation-on chronic Coumadin therapy  History of Present Illness:  [name] is a 71 year old gentleman with a history of coronary artery disease-status post coronary artery bypass grafting in 2003. He has history of congestive heart failure with an ejection fraction of 32%. He has an AICD and had a generator replaced in April 2001. He also has a history of hyperlipidemia, hypothyroidism and intermittent atrial fibrillation. He is on chronic Coumadin therapy.  He gets all of his medications through the [facility].  He is having significant dyspnea with exertion.  Not eating any extra salt.  This dyspnea has been going on for 7 months.  He had a stress Myoview in November, 2012 which revealed an ejection fraction of 27%. He had akinesis of the anterior wall and inferior wall. The lateral wall was still wall that moved to the extent. He had a CT scan at the [facility]. He was found to have emphysema as well.   Current Outpatient Prescriptions on File Prior to Visit  Medication Sig Dispense Refill  . albuterol (PROVENTIL) (2.5 MG/3ML) 0.083% nebulizer solution Take 2.5 mg by nebulization every 6 (six) hours as needed.        . carvedilol (COREG) 12.5 MG tablet Take 12.5 mg by mouth 2 (two) times daily with a meal.      . docusate sodium (COLACE) 100 MG capsule Take 100 mg by mouth 2 (two) times daily.        Donald Prose (FLORA-Q) CAPS Take 1 capsule by mouth 2 (two) times daily. Taking 12mg       . levothyroxine (SYNTHROID, LEVOTHROID) 100 MCG tablet Take 100 mcg by mouth daily.        [name] lisinopril (PRINIVIL,ZESTRIL) 40 MG tablet Take  20 mg by mouth daily.        . nitroGLYCERIN (NITROSTAT) 0.4 MG SL tablet Place 0.4 mg under the tongue every 5 (five) minutes as needed.        . NON FORMULARY OXYGEN 2 liters qhs prn        . ranitidine (ZANTAC) 150 MG tablet Take 150 mg by mouth 2 (two) times daily.        . simvastatin (ZOCOR) 80 MG tablet Take 40 mg by mouth at bedtime.        [name] tiotropium (SPIRIVA) 18 MCG inhalation capsule Place 18 mcg into inhaler and inhale daily.        [name] warfarin (COUMADIN) 5 MG tablet Take 5 mg by mouth as directed.       [name] DISCONTD: aspirin 81 MG tablet Take 81 mg by mouth daily.          No Known Allergies  Past Medical History  Diagnosis Date  . Ischemic cardiomyopathy      CABG 2003, PCI 2007  EF 27%(myoview 2012  . CHF (congestive heart failure)     class I-II  . Sleep apnea     ?  [name] Intestine disorder     blockage  . ICD (implantable cardiac defibrillator) in place   . Hyperlipidemia   . Hypothyroidism   . Chronic anticoagulation   . Obesity   .  COPD (chronic obstructive pulmonary disease)   . Asbestosis   . Atrial fibrillation     paroxysmal; on coumadin  . Paroxysmal ventricular tachycardia     has ICD in place  . Coronary artery disease     Past Surgical History  Procedure Date  . Coronary artery bypass graft   . Cardiac defibrillator placement     History  Smoking status  . Former Smoker -- 2.0 packs/day for 45 years  . Types: Cigarettes  . Quit date: 11/11/2001  Smokeless tobacco  . Not on file    History  Alcohol Use No    Family History  Problem Relation Age of Onset  . Heart attack Mother   . Heart attack Father     Reviw of Systems:  Reviewed in the HPI.  All other systems are negative.  Physical Exam: BP 107/61  Pulse 72  Ht 6' (1.829 m)  Wt 237 lb (107.502 kg)  BMI 32.14 kg/m2  SpO2 97% The patient is alert and oriented x 3.  The mood and affect are normal.   Skin: warm and dry.  Color is normal.    HEENT:   the sclera are  nonicteric.  The mucous membranes are moist.  The carotids are 2+ without bruits.  There is no thyromegaly.  There is no JVD.    Lungs: clear.  The chest wall is non tender.    Heart: irregularly irregular with a normal S1 and S2.  There are no murmurs, gallops, or rubs. The PMI is not displaced.     Abdomen: good bowel sounds.  There is no guarding or rebound.  There is no hepatosplenomegaly or tenderness.  There are no masses.   Extremities:  no clubbing, cyanosis, or edema.  The legs are without rashes.  The distal pulses are intact.   Neuro:  Cranial nerves II - XII are intact.  Motor and sensory functions are intact.    The gait is normal.  EKG: 07/02/2012-atrial fibrillation at a rate of 73. He has T-wave abnormalities. EKG is unchanged from previous tracings.  Assessment / Plan:

## 2012-07-02 NOTE — Patient Instructions (Addendum)
Your physician wants you to follow-up in: 6 months  You will receive a reminder letter in the mail two months in advance. If you don't receive a letter, please call our office to schedule the follow-up appointment.  Your physician recommends that you return for a FASTING lipid profile: 6 months   

## 2012-07-02 NOTE — Assessment & Plan Note (Signed)
[name] has had several myocardial infarctions. He has a history of coronary artery bypass grafting. His last Myoview study in November, 2012 revealed significant source and anterior apical and inferior walls. The lateral wall was gently wall contracted to any degree.  He's not having any episodes of angina. We'll continue with his same medications.

## 2012-07-02 NOTE — Progress Notes (Signed)
ICD check 

## 2012-07-02 NOTE — Assessment & Plan Note (Addendum)
Mansa has significant shortness breath with exertion. He's on maximal medical therapy. Blood pressure is on the low side.  He has an ejection fraction of 27%. He also has COPD.  One possibility would be to add spirinolcatone but I think this may cause him to have further hypertension. He has an AICD in place already.  We discussed the possibility of sending him to be advanced heart failure clinic with Dr. Gala Romney.  I've advised him to work on a good diet and if her to lose weight. We'll see him again in 6 months.

## 2012-07-06 ENCOUNTER — Telehealth: Payer: Self-pay | Admitting: *Deleted

## 2012-07-06 DIAGNOSIS — I509 Heart failure, unspecified: Secondary | ICD-10-CM

## 2012-07-06 NOTE — Telephone Encounter (Signed)
Message copied by Antony Odea on Mon Jul 06, 2012  5:32 PM ------      Message from: Vesta Mixer      Created: Fri Jul 03, 2012  6:07 PM       Labs are OK.  The BNP is only minimally elevated ( which is a good sing)

## 2012-07-06 NOTE — Patient Instructions (Addendum)
OPENED IN ERROR

## 2012-07-06 NOTE — Progress Notes (Signed)
This encounter was created in error - please disregard. This encounter was created in error - please disregard. This encounter was created in error - please disregard. 

## 2012-07-06 NOTE — Telephone Encounter (Signed)
Yes, Please refer to Dr. Gala Romney for Advanced CHF clinic for consult.  I would be happy to still follow him also

## 2012-07-06 NOTE — Telephone Encounter (Signed)
PT WOULD LIKE TO BE INCLUDED IN ANY TYPE OF EXPERIMENTAL DRUGS OR TREATMENT FOR HIS CHF, I SEE YOU MAY WANT HIM SENT TO DR Gala Romney, CAN I PLACE THE ORDER? PT WOULD LIKE TO CONSULT WITH HIM.

## 2012-07-07 LAB — HEPATIC FUNCTION PANEL
AST: 33 U/L (ref 0–37)
Albumin: 3.7 g/dL (ref 3.5–5.2)
Alkaline Phosphatase: 47 U/L (ref 39–117)
Bilirubin, Direct: 0.2 mg/dL (ref 0.0–0.3)
Total Bilirubin: 0.9 mg/dL (ref 0.3–1.2)

## 2012-07-07 LAB — LIPID PANEL
LDL Cholesterol: 58 mg/dL (ref 0–99)
Total CHOL/HDL Ratio: 3
Triglycerides: 134 mg/dL (ref 0.0–149.0)

## 2012-07-07 LAB — BASIC METABOLIC PANEL
BUN: 17 mg/dL (ref 6–23)
CO2: 27 mEq/L (ref 19–32)
Calcium: 9.7 mg/dL (ref 8.4–10.5)
Chloride: 105 mEq/L (ref 96–112)
Creatinine, Ser: 1 mg/dL (ref 0.4–1.5)

## 2012-07-07 NOTE — Telephone Encounter (Signed)
Will forward order to pcc to set pt up.

## 2012-07-08 NOTE — Telephone Encounter (Signed)
appt sch for 10/30, Sharon at [facility] will notify pt

## 2012-07-08 NOTE — Telephone Encounter (Signed)
Sounds good. We will see him. Herbert Seta can you help arrange?

## 2012-07-09 ENCOUNTER — Telehealth: Payer: Self-pay | Admitting: *Deleted

## 2012-07-09 NOTE — Telephone Encounter (Signed)
noted 

## 2012-07-09 NOTE — Telephone Encounter (Signed)
Message copied by Antony Odea on Thu Jul 09, 2012  1:35 PM ------      Message from: Omar Person B      Created: Wed Jul 08, 2012  5:21 PM      Regarding: FW: bensimhon appt       07-29-12 @ 2 pm  Pt is aware      ----- Message -----         From: Pricilla Holm         Sent: 07/07/2012   5:59 PM           To: Pricilla Holm, Antony Odea, RN      Subject: bensimhon appt                                           Jasmine December is working on       ----- Message -----         From: Antony Odea, RN         Sent: 07/07/2012   8:45 AM           To: Antony Odea, RN, [facility]            Please schedule pt with dr Gala Romney

## 2012-07-29 ENCOUNTER — Ambulatory Visit (HOSPITAL_COMMUNITY)
Admission: RE | Admit: 2012-07-29 | Discharge: 2012-07-29 | Disposition: A | Discharge status: Home / Self Care | Payer: Medicare Other | Source: Ambulatory Visit | Attending: Internal Medicine | Admitting: Internal Medicine

## 2012-07-29 ENCOUNTER — Encounter (HOSPITAL_COMMUNITY): Payer: Self-pay

## 2012-07-29 VITALS — BP 110/66 | HR 74 | Wt 241.8 lb

## 2012-07-29 DIAGNOSIS — R0989 Other specified symptoms and signs involving the circulatory and respiratory systems: Secondary | ICD-10-CM | POA: Insufficient documentation

## 2012-07-29 DIAGNOSIS — R0609 Other forms of dyspnea: Secondary | ICD-10-CM | POA: Insufficient documentation

## 2012-07-29 DIAGNOSIS — R0600 Dyspnea, unspecified: Secondary | ICD-10-CM

## 2012-07-29 DIAGNOSIS — I5022 Chronic systolic (congestive) heart failure: Secondary | ICD-10-CM | POA: Insufficient documentation

## 2012-07-29 NOTE — Progress Notes (Signed)
Patient ID: [name], male   DOB: 1940-10-13, 71 y.o.   MRN: [id number]  Weight Range   Baseline proBNP   Referred by: Dr Elease Hashimoto PCP VA: Dr Cherie Ouch PCP:  Dr Leanora Ivanoff Cardiologist: Dr Elease Hashimoto Pulmonologist VA:   HPI: [name] is a 71 year old gentleman with a history of CABG (2003),  Chronic systolic HF EF 32%, S/P AICD (generator change 2011), hyperlipidemia, hypothyroidism, emphysema,  OSA, intermittent atrial fibrillation (on coumadin). Quit smoking 2003.   He had a stress Myoview in November, 2012 which revealed an ejection fraction of 27%. He had akinesis of the anterior wall and inferior wall. The lateral wall was still wall that moved to the extent. He had a CT scan at the [facility] which showed empysema. Had PFTs recently at [facility] and told "lungs were drying up." Walked around clinic and O2 never dropped.   He is referred to HF clinic by Dr Elease Hashimoto due to progressive dyspnea. Over the last 6 months increased dyspnea.  Weight over the last 6 months 240-248. Unable to walk to the back of grocery without getting short of breath. Denies PND/Orthopnea/CP. Says he just doesn't have any air. Obtains medications from [place]. Followed by pulmonolgist at [facility] with spiriva added at most recent visit. Uses CPAP and oxygen every night. Weight 240 pounds. Completed pulmonary rehab in July 2013. Disabled [name].   PFTs from 2/12 FEV1 1.6L (45%) FVC  2.4L (49%) FEF 25-75  1.08 (33%)  2013 PFTs  unavailable.    ROS: All systems negative except as listed in HPI, PMH and Problem List.  Past Medical History  Diagnosis Date  . Ischemic cardiomyopathy      CABG 2003, PCI 2007  EF 27%(myoview 2012  . CHF (congestive heart failure)     class I-II  . Sleep apnea     ?  [name] Intestine disorder     blockage  . ICD (implantable cardiac defibrillator) in place   . Hyperlipidemia   . Hypothyroidism   . Chronic anticoagulation   . Obesity   . COPD (chronic obstructive pulmonary disease)   .  Asbestosis   . Atrial fibrillation     paroxysmal; on coumadin  . Paroxysmal ventricular tachycardia     has ICD in place  . Coronary artery disease     Current Outpatient Prescriptions  Medication Sig Dispense Refill  . albuterol (PROVENTIL) (2.5 MG/3ML) 0.083% nebulizer solution Take 2.5 mg by nebulization every 6 (six) hours as needed.        . carvedilol (COREG) 12.5 MG tablet Take 12.5 mg by mouth 2 (two) times daily with a meal.      . docusate sodium (COLACE) 100 MG capsule Take 100 mg by mouth 2 (two) times daily.        [name] (FLORA-Q) CAPS Take 1 capsule by mouth 2 (two) times daily. Taking 12mg       . levothyroxine (SYNTHROID, LEVOTHROID) 100 MCG tablet Take 100 mcg by mouth daily.        [name] lisinopril (PRINIVIL,ZESTRIL) 40 MG tablet Take 20 mg by mouth daily.        . nitroGLYCERIN (NITROSTAT) 0.4 MG SL tablet Place 0.4 mg under the tongue every 5 (five) minutes as needed.        . NON FORMULARY OXYGEN 2 liters qhs prn        . ranitidine (ZANTAC) 150 MG tablet Take 150 mg by mouth 2 (two) times daily.        [name]  simvastatin (ZOCOR) 80 MG tablet Take 40 mg by mouth at bedtime.        [name] tiotropium (SPIRIVA) 18 MCG inhalation capsule Place 18 mcg into inhaler and inhale daily.        [name] warfarin (COUMADIN) 5 MG tablet Take 5 mg by mouth as directed.       [name] DISCONTD: aspirin 81 MG tablet Take 81 mg by mouth daily.           PHYSICAL EXAM: Filed Vitals:   07/29/12 1230  BP: 110/66  Pulse: 74  Weight: 241 lb 12.8 oz (109.68 kg)  SpO2: 96%    General:  Well appearing. No resp difficulty (wife visit) HEENT: normal Neck: supple. JVP flat. Carotids 2+ bilaterally; no bruits. No lymphadenopathy or thryomegaly appreciated. Cor: PMI normal. Regular rate & rhythm. No rubs, gallops or murmurs. Lungs: clear Abdomen: soft, obese,nontender, nondistended. No hepatosplenomegaly. No bruits or masses. Good bowel sounds. Extremities: no cyanosis, clubbing, rash, edema Neuro: alert &  orientedx3, cranial nerves grossly intact. Moves all 4 extremities w/o difficulty. Affect pleasant.      ASSESSMENT & PLAN:

## 2012-07-29 NOTE — Assessment & Plan Note (Addendum)
NYHA III. Volume status stable. Continue current regimen. Reinforced daily weights. Will order CPX test to try and sort out reason for progressive dyspnea. Cardiac versus pulmonary. Follow up in 1 month.

## 2012-07-29 NOTE — Patient Instructions (Addendum)
Please schedule CPX test   Follow up in 1 month with Dr Gala Romney

## 2012-07-29 NOTE — Assessment & Plan Note (Addendum)
Progressive dyspnea over the last 6 months. As noted, CPX test ordered to try and sort out reason for dyspnea cardiac versus pulmonary.   Patient seen and examined with Tonye Becket, NP. We discussed all aspects of the encounter. I agree with the assessment and plan as stated above. He has significant dyspnea which is truly limiting his QOL. His volume status looks good.I have reviewed his previous studies in depth including PFTs and echo images. I also walked him personally in clinic and sats stayed > 90%. My suspicion is that his dyspnea is a combination of fairly advanced COPD and his HF and I am not sure there is much we will be able to do improve his symptoms. That said, I think it is reasonable to pursue CPX testing to further delineate the degree of his limitation and determine the relative contributions of his COPD and HF to his limitation.

## 2012-08-12 ENCOUNTER — Ambulatory Visit (HOSPITAL_COMMUNITY): Payer: Medicare Other | Attending: Internal Medicine

## 2012-08-12 DIAGNOSIS — I509 Heart failure, unspecified: Secondary | ICD-10-CM | POA: Insufficient documentation

## 2012-08-12 DIAGNOSIS — R0600 Dyspnea, unspecified: Secondary | ICD-10-CM

## 2012-08-12 DIAGNOSIS — R0602 Shortness of breath: Secondary | ICD-10-CM

## 2012-08-12 DIAGNOSIS — J4489 Other specified chronic obstructive pulmonary disease: Secondary | ICD-10-CM | POA: Insufficient documentation

## 2012-08-12 DIAGNOSIS — J449 Chronic obstructive pulmonary disease, unspecified: Secondary | ICD-10-CM | POA: Insufficient documentation

## 2012-08-26 ENCOUNTER — Other Ambulatory Visit: Payer: Self-pay | Admitting: Cardiothoracic Surgery

## 2012-08-26 ENCOUNTER — Ambulatory Visit (HOSPITAL_COMMUNITY)
Admission: RE | Admit: 2012-08-26 | Discharge: 2012-08-26 | Disposition: A | Discharge status: Home / Self Care | Payer: Medicare Other | Source: Ambulatory Visit | Attending: Internal Medicine | Admitting: Internal Medicine

## 2012-08-26 ENCOUNTER — Inpatient Hospital Stay (HOSPITAL_COMMUNITY): Admission: RE | Admit: 2012-08-26 | Payer: Medicare Other | Source: Ambulatory Visit

## 2012-08-26 ENCOUNTER — Encounter (HOSPITAL_COMMUNITY): Payer: Self-pay

## 2012-08-26 VITALS — BP 128/90 | HR 88 | Wt 233.4 lb

## 2012-08-26 DIAGNOSIS — I4891 Unspecified atrial fibrillation: Secondary | ICD-10-CM

## 2012-08-26 DIAGNOSIS — I2510 Atherosclerotic heart disease of native coronary artery without angina pectoris: Secondary | ICD-10-CM

## 2012-08-26 DIAGNOSIS — I509 Heart failure, unspecified: Secondary | ICD-10-CM

## 2012-08-26 DIAGNOSIS — I5022 Chronic systolic (congestive) heart failure: Secondary | ICD-10-CM

## 2012-08-26 NOTE — Progress Notes (Addendum)
VAD Coordinator Note:  Met with patient and wife today about advanced therapy options. Provided the patient with brochure, educational CD and handouts on the HM II. One of our VAD patients met with the patient and his wife to answer questions and show them what the device looked like. I also showed them what the mock-loop in order for them to see how the pump functions. Discussed in depth with patient and family the risks, benefits, and other options. They are very interested in having the device placed and would like [place] to start the work-up process and look into his insurance coverage. They both read and signed the Patient Agreement for VAD and Potential Implantation form. He will see Dr. Maren Beach in his clinic next Wednesday and will begin the work-up. Provided family with pager if they have any questions.

## 2012-08-26 NOTE — Progress Notes (Signed)
Patient ID: [name], male   DOB: 1941/01/19, 71 y.o.   MRN: [id number]   Weight Range   Baseline proBNP   Referred by: Dr Elease Hashimoto PCP VA: Dr Cherie Ouch PCP:  Dr Leanora Ivanoff Cardiologist: Dr Elease Hashimoto Pulmonologist [facility]:   HPI: [name] is a 71 year old gentleman with a history of CABG (2003),  Chronic systolic HF EF 32%, S/P AICD (generator change 2011), hyperlipidemia, hypothyroidism, emphysema,  OSA, intermittent atrial fibrillation (on coumadin). Quit smoking 2003.   He had a stress Myoview in November, 2012 which revealed an ejection fraction of 27%. He had akinesis of the anterior wall and inferior wall. The lateral wall was still wall that moved to the extent. He had [facility] at the [facility] which showed empysema. Had PFTs recently at [facility] and told "lungs were drying up." Walked around clinic and O2 never dropped.   PFTs from 2/12 FEV1  1.6L (45%) FVC   2.4L (49%) FEF 25-75  1.08 (33%)  08/12/2012 Pre Exercise  For CPX PFTS 08/12/2012 FEV1  1.66 (47%) FVC  2.6 (54%)  CPX 08/12/2012 Peak VO2 9.2 % with predicted peak VO2 39.6% VE/VCO2 slope 52.8 QUES 1.15 RER 1.02  Moderate-severely reduced functional capacity .   He returns for follow up. Complains of dyspnea with almost any exertion and ADLs. + severe fatigue.  Denies CP/PND or edema.. Weight stable. He says Spiriva has helped.  Uses CPAP and oxygen every night.Compliant with medications. Lives at home with his wife.  Completed pulmonary rehab in July 2013. Disabled [employer].   ROS: All systems negative except as listed in HPI, PMH and Problem List.  Past Medical History  Diagnosis Date  . Ischemic cardiomyopathy      CABG 2003, PCI 2007  EF 27%(myoview 2012  . CHF (congestive heart failure)     class I-II  . Sleep apnea     ?  [name] Intestine disorder     blockage  . ICD (implantable cardiac defibrillator) in place   . Hyperlipidemia   . Hypothyroidism   . Chronic anticoagulation   . Obesity   . COPD  (chronic obstructive pulmonary disease)   . Asbestosis   . Atrial fibrillation     paroxysmal; on coumadin  . Paroxysmal ventricular tachycardia     has ICD in place  . Coronary artery disease     Current Outpatient Prescriptions  Medication Sig Dispense Refill  . albuterol (PROVENTIL) (2.5 MG/3ML) 0.083% nebulizer solution Take 2.5 mg by nebulization every 6 (six) hours as needed.        . carvedilol (COREG) 12.5 MG tablet Take 12.5 mg by mouth 2 (two) times daily with a meal.      . docusate sodium (COLACE) 100 MG capsule Take 100 mg by mouth 2 (two) times daily.        Donald Prose (FLORA-Q) CAPS Take 1 capsule by mouth 2 (two) times daily. Taking 12mg       . levothyroxine (SYNTHROID, LEVOTHROID) 100 MCG tablet Take 100 mcg by mouth daily.        [name] lisinopril (PRINIVIL,ZESTRIL) 40 MG tablet Take 20 mg by mouth daily.        . nitroGLYCERIN (NITROSTAT) 0.4 MG SL tablet Place 0.4 mg under the tongue every 5 (five) minutes as needed.        . NON FORMULARY OXYGEN 3 liters qhs prn      . ranitidine (ZANTAC) 150 MG tablet Take 150 mg by mouth 2 (  two) times daily.        . simvastatin (ZOCOR) 80 MG tablet Take 40 mg by mouth at bedtime.        [name] tiotropium (SPIRIVA) 18 MCG inhalation capsule Place 18 mcg into inhaler and inhale daily.        [name] warfarin (COUMADIN) 5 MG tablet Take 5 mg by mouth as directed.       . [DISCONTINUED] aspirin 81 MG tablet Take 81 mg by mouth daily.           PHYSICAL EXAM: Filed Vitals:   08/26/12 1107  BP: 128/90  Pulse: 88  Weight: 233 lb 6.4 oz (105.87 kg)  SpO2: 94%    General:  Well appearing. No resp difficulty (wife visit) HEENT: normal Neck: supple. JVP flat. Carotids 2+ bilaterally; no bruits. No lymphadenopathy or thryomegaly appreciated. Cor: PMI normal. Irregular rate & rhythm. No rubs, gallops or murmurs. Lungs: clear decreased in the bases Abdomen: soft, obese,nontender, nondistended. No hepatosplenomegaly. No bruits or masses. Good bowel  sounds. Extremities: no cyanosis, clubbing, rash, edema Neuro: alert & orientedx3, cranial nerves grossly intact. Moves all 4 extremities w/o difficulty. Affect pleasant.       ASSESSMENT & PLAN:

## 2012-08-26 NOTE — Assessment & Plan Note (Addendum)
NYHA IIIb-IV. Discussed CPX results which indicates poor circulatory reserve. Peak  VO2 9.2. Discussed advanced therapies which includes: continuing the same course, insertion of VAD, or heart transplant. Heart transplant not an option due to poor lung function. [name] would like to purse VAD. Our VAD coordinator provided consultation regarding purpose of VAD and daily requirements. He and his wife are agreeable to pursue VAD work up. Refer to Dr [name] for surgical consultation.  Patient seen and examined with Tonye Becket, NP. We discussed all aspects of the encounter. I agree with the assessment and plan as stated above.  [name] has severe LV dysfunction with NYHA IIIb-IV symptoms despite aggressive medical therapy. Initially, my impression was that his COPD may be primary factor but CPX shows moderate COPD with a very severe circulatory limitation. We discussed the options at length. Given COPD and age, I do not think he is an ideal transplant candidate. I think VAD is his best option currently. In clinic, we showed him the VAD equipment and introduced him to a current VAD patient. He is interested in proceeding. We will arrange cath in near future. He will see Dr. Donata Wood early next week. Given end-stage symptoms and high-risk CPX would plan VAD in December. Total time spent = 90 minutes.

## 2012-08-31 ENCOUNTER — Ambulatory Visit (HOSPITAL_COMMUNITY): Payer: Medicare Other

## 2012-09-02 ENCOUNTER — Other Ambulatory Visit: Payer: Medicare Other

## 2012-09-02 ENCOUNTER — Encounter: Payer: Medicare Other | Admitting: Cardiothoracic Surgery

## 2012-09-08 ENCOUNTER — Telehealth (HOSPITAL_COMMUNITY): Payer: Self-pay | Admitting: Cardiology

## 2012-09-08 NOTE — Telephone Encounter (Signed)
Per Ulla Potash NP as she spoke with pts wife in the past as the agreed to follow up with the HF team. Attempted to contact pt to schedule follow up and the pt/pt wife would not like to schedule an appointment at this time. Would like to continue to be followed by Dr.Niaser

## 2012-10-06 ENCOUNTER — Encounter: Payer: Medicare Other | Admitting: Internal Medicine

## 2012-12-09 ENCOUNTER — Encounter: Payer: Medicare Other | Admitting: Internal Medicine

## 2012-12-17 ENCOUNTER — Ambulatory Visit (INDEPENDENT_AMBULATORY_CARE_PROVIDER_SITE_OTHER): Payer: Medicare Other | Admitting: Internal Medicine

## 2012-12-17 ENCOUNTER — Encounter: Payer: Self-pay | Admitting: Internal Medicine

## 2012-12-17 VITALS — BP 98/62 | HR 72 | Ht 72.0 in | Wt 235.5 lb

## 2012-12-17 DIAGNOSIS — I4891 Unspecified atrial fibrillation: Secondary | ICD-10-CM

## 2012-12-17 DIAGNOSIS — Z95810 Presence of automatic (implantable) cardiac defibrillator: Secondary | ICD-10-CM

## 2012-12-17 DIAGNOSIS — I2510 Atherosclerotic heart disease of native coronary artery without angina pectoris: Secondary | ICD-10-CM

## 2012-12-17 DIAGNOSIS — I472 Ventricular tachycardia: Secondary | ICD-10-CM

## 2012-12-17 DIAGNOSIS — I255 Ischemic cardiomyopathy: Secondary | ICD-10-CM

## 2012-12-17 DIAGNOSIS — I509 Heart failure, unspecified: Secondary | ICD-10-CM

## 2012-12-17 DIAGNOSIS — I2589 Other forms of chronic ischemic heart disease: Secondary | ICD-10-CM

## 2012-12-17 DIAGNOSIS — I5022 Chronic systolic (congestive) heart failure: Secondary | ICD-10-CM

## 2012-12-17 LAB — ICD DEVICE OBSERVATION
BATTERY VOLTAGE: 3.1236 V
BRDY-0002RV: 40 {beats}/min
CHARGE TIME: 9.359 s
DEV-0020ICD: NEGATIVE
RV LEAD AMPLITUDE: 9.25 mv
TOT-0002: 4
TOT-0006: 20110428000000
TZAT-0005SLOWVT: 88 pct
TZAT-0005SLOWVT: 91 pct
TZAT-0011SLOWVT: 10 ms
TZAT-0011SLOWVT: 10 ms
TZAT-0012SLOWVT: 200 ms
TZAT-0012SLOWVT: 200 ms
TZAT-0013SLOWVT: 3
TZAT-0018SLOWVT: NEGATIVE
TZAT-0018SLOWVT: NEGATIVE
TZAT-0019FASTVT: 8 V
TZAT-0019SLOWVT: 8 V
TZAT-0019SLOWVT: 8 V
TZAT-0020FASTVT: 1.5 ms
TZON-0003SLOWVT: 310 ms
TZON-0004SLOWVT: 40
TZON-0005SLOWVT: 24
TZST-0001FASTVT: 2
TZST-0001FASTVT: 3
TZST-0001FASTVT: 5
TZST-0001SLOWVT: 4
TZST-0001SLOWVT: 6
TZST-0002FASTVT: NEGATIVE
TZST-0002FASTVT: NEGATIVE
TZST-0002FASTVT: NEGATIVE
TZST-0003SLOWVT: 35 J
TZST-0003SLOWVT: 35 J
VENTRICULAR PACING ICD: 0.91 pct

## 2012-12-17 NOTE — Assessment & Plan Note (Signed)
permanent

## 2012-12-17 NOTE — Progress Notes (Signed)
Patient Care Team: Thayer Headings, MD as PCP - General (Internal Medicine)   HPI  [name] is a 72 y.o. male Seen in followup for ICD implantation for primary prevention in the setting of ischemic heart disease with prior bypass surgery and depressed left ventricular function. He has permanent atrial fibrillation and is on Coumadin  He was seen by [name] after being called by [facility] that he had ICD therapy for VT. He had no awareness and no symptoms  Myoview scanning 2012 demonstrating prior infarct with ejection fraction 25-30% with no ischemia   We have not seen him in 18 months   Followed by CHF clinic and recently referred for consideration of VAD Rx for CIIIb/IV  But declined to pursue it   He is having good days and bad days  Not significant resting dyspnea and not much edema   Past Medical History  Diagnosis Date  . Ischemic cardiomyopathy      CABG 2003, PCI 2007  EF 27%(myoview 2012  . CHF (congestive heart failure)     class I-II  . Sleep apnea     ?  [name] Intestine disorder     blockage  . ICD (implantable cardiac defibrillator) in place   . Hyperlipidemia   . Hypothyroidism   . Chronic anticoagulation   . Obesity   . COPD (chronic obstructive pulmonary disease)   . Asbestosis   . Atrial fibrillation     paroxysmal; on coumadin  . Paroxysmal ventricular tachycardia     has ICD in place  . Coronary artery disease     Past Surgical History  Procedure Laterality Date  . Coronary artery bypass graft    . Cardiac defibrillator placement      Current Outpatient Prescriptions  Medication Sig Dispense Refill  . albuterol (PROVENTIL) (2.5 MG/3ML) 0.083% nebulizer solution Take 2.5 mg by nebulization every 6 (six) hours as needed.        . budesonide-formoterol (SYMBICORT) 160-4.5 MCG/ACT inhaler Inhale 2 puffs into the lungs 2 (two) times daily.      . carvedilol (COREG) 12.5 MG tablet Take 12.5 mg by mouth 2 (two) times daily with a meal.      .  docusate sodium (COLACE) 100 MG capsule Take 100 mg by mouth 2 (two) times daily.        Donald Prose (FLORA-Q) CAPS Take 1 capsule by mouth 2 (two) times daily. Taking 12mg       . levothyroxine (SYNTHROID, LEVOTHROID) 100 MCG tablet Take 100 mcg by mouth daily.        [name] lisinopril (PRINIVIL,ZESTRIL) 40 MG tablet Take 20 mg by mouth daily.        . nitroGLYCERIN (NITROSTAT) 0.4 MG SL tablet Place 0.4 mg under the tongue every 5 (five) minutes as needed.        . NON FORMULARY OXYGEN 3 liters qhs prn      . ranitidine (ZANTAC) 150 MG tablet Take 150 mg by mouth 2 (two) times daily.        . simvastatin (ZOCOR) 80 MG tablet Take 40 mg by mouth at bedtime.        [name] tiotropium (SPIRIVA) 18 MCG inhalation capsule Place 18 mcg into inhaler and inhale daily.       [name] warfarin (COUMADIN) 5 MG tablet Take 5 mg by mouth as directed.       . [DISCONTINUED] aspirin 81 MG tablet Take 81 mg by mouth daily.  No current facility-administered medications for this visit.    No Known Allergies  Review of Systems negative except from HPI and PMH  Physical Exam BP 98/62  Pulse 72  Ht 6' (1.829 m)  Wt 235 lb 8 oz (106.822 kg)  BMI 31.93 kg/m2 Well developed and nourished in no acute distress HENT normal Neck supple with JVP-flat Carotids brisk and full without bruits Clear Irregularly irregular rate and rhythm with controlled ventricular response, no murmurs or gallops Abd-soft with active BS without hepatomegaly No Clubbing cyanosis edema Skin-warm and dry A & Oriented  Grossly normal sensory and motor function    Assessment and  Plan

## 2012-12-17 NOTE — Assessment & Plan Note (Signed)
No intercurrent Ventricular tachycardia  

## 2012-12-17 NOTE — Patient Instructions (Addendum)
Your physician wants you to follow-up in: 1 year with Dr. Graciela Husbands. [facility] transmission 03/22/13. You will receive a reminder letter in the mail two months in advance. If you don't receive a letter, please call our office to schedule the follow-up appointment.

## 2012-12-17 NOTE — Assessment & Plan Note (Addendum)
Stable  cIIIb  We spent >50% of 30 min discussing strategies to consider LVAD and rx fore his CHF  I dont think he is quite resigned to  Dying yet but is anxious about limitations and risk of LVAD  He said he would be open to get a call from Alli--LVAD coordinator

## 2012-12-17 NOTE — Assessment & Plan Note (Signed)
./  dfn The patient's device was interrogated.  The information was reviewed. No changes were made in the programming.    

## 2012-12-22 ENCOUNTER — Other Ambulatory Visit (HOSPITAL_COMMUNITY): Payer: Self-pay | Admitting: Anesthesiology

## 2012-12-22 DIAGNOSIS — I5022 Chronic systolic (congestive) heart failure: Secondary | ICD-10-CM

## 2012-12-23 ENCOUNTER — Ambulatory Visit (HOSPITAL_COMMUNITY)
Admission: RE | Admit: 2012-12-23 | Discharge: 2012-12-23 | Disposition: A | Discharge status: Home / Self Care | Payer: Medicare Other | Source: Ambulatory Visit | Attending: Internal Medicine | Admitting: Internal Medicine

## 2012-12-23 DIAGNOSIS — I2510 Atherosclerotic heart disease of native coronary artery without angina pectoris: Secondary | ICD-10-CM | POA: Insufficient documentation

## 2012-12-23 DIAGNOSIS — R0609 Other forms of dyspnea: Secondary | ICD-10-CM | POA: Insufficient documentation

## 2012-12-23 DIAGNOSIS — I4891 Unspecified atrial fibrillation: Secondary | ICD-10-CM

## 2012-12-23 DIAGNOSIS — I2589 Other forms of chronic ischemic heart disease: Secondary | ICD-10-CM | POA: Insufficient documentation

## 2012-12-23 DIAGNOSIS — I059 Rheumatic mitral valve disease, unspecified: Secondary | ICD-10-CM

## 2012-12-23 DIAGNOSIS — Z95810 Presence of automatic (implantable) cardiac defibrillator: Secondary | ICD-10-CM | POA: Insufficient documentation

## 2012-12-23 DIAGNOSIS — Z87891 Personal history of nicotine dependence: Secondary | ICD-10-CM | POA: Insufficient documentation

## 2012-12-23 DIAGNOSIS — I509 Heart failure, unspecified: Secondary | ICD-10-CM | POA: Insufficient documentation

## 2012-12-23 DIAGNOSIS — I079 Rheumatic tricuspid valve disease, unspecified: Secondary | ICD-10-CM | POA: Insufficient documentation

## 2012-12-23 DIAGNOSIS — R0989 Other specified symptoms and signs involving the circulatory and respiratory systems: Secondary | ICD-10-CM | POA: Insufficient documentation

## 2012-12-23 NOTE — Progress Notes (Signed)
  Echocardiogram 2D Echocardiogram has been performed.  Jorje Guild 12/23/2012, 10:34 AM

## 2012-12-28 ENCOUNTER — Encounter: Payer: Medicare Other | Admitting: Cardiothoracic Surgery

## 2012-12-29 ENCOUNTER — Encounter (INDEPENDENT_AMBULATORY_CARE_PROVIDER_SITE_OTHER): Payer: Medicare Other | Admitting: Cardiothoracic Surgery

## 2012-12-29 VITALS — Ht 72.0 in | Wt 235.0 lb

## 2012-12-29 DIAGNOSIS — Z95811 Presence of heart assist device: Secondary | ICD-10-CM

## 2012-12-29 HISTORY — DX: Presence of heart assist device: Z95.811

## 2012-12-30 ENCOUNTER — Other Ambulatory Visit: Payer: Self-pay

## 2012-12-30 ENCOUNTER — Encounter: Payer: Self-pay | Admitting: Cardiothoracic Surgery

## 2012-12-30 ENCOUNTER — Encounter (HOSPITAL_COMMUNITY): Payer: Self-pay | Admitting: Pharmacy Technician

## 2012-12-30 ENCOUNTER — Other Ambulatory Visit (HOSPITAL_COMMUNITY): Payer: Self-pay | Admitting: Anesthesiology

## 2012-12-30 ENCOUNTER — Institutional Professional Consult (permissible substitution) (INDEPENDENT_AMBULATORY_CARE_PROVIDER_SITE_OTHER): Payer: Medicare Other | Admitting: Cardiothoracic Surgery

## 2012-12-30 VITALS — BP 100/63 | HR 87 | Resp 24 | Ht 72.0 in | Wt 235.0 lb

## 2012-12-30 DIAGNOSIS — I359 Nonrheumatic aortic valve disorder, unspecified: Secondary | ICD-10-CM

## 2012-12-30 DIAGNOSIS — I509 Heart failure, unspecified: Secondary | ICD-10-CM

## 2012-12-30 DIAGNOSIS — I5022 Chronic systolic (congestive) heart failure: Secondary | ICD-10-CM

## 2012-12-30 DIAGNOSIS — I2510 Atherosclerotic heart disease of native coronary artery without angina pectoris: Secondary | ICD-10-CM

## 2012-12-30 NOTE — Progress Notes (Signed)
PCP is MACKENZIE,BRIAN, MD Referring Provider is Bensimhon, Daniel R, MD  Chief Complaint  Patient presents with  . Congestive Heart Failure    to discuss VAD placemnt    HPI: 72-year-old Caucasian male reformed smoker with ischemic cardiomyopathy, ejection fraction of approximately 25%, severe class IIIB symptoms unable to do any ordinary activities without severe shortness of breath and weakness. This has been progressive over the past 3 months. He denies any angina. He denies orthopnea or PND. No significant ankle edema.  He is status post CABG x3 in 2003-LIMA to LAD, vein graft to OM, vein graft to RCA. His EF at that time was approximately 30% and postop he had an AICD placed but he has never been shocked . He is been in atrial fibrillation on Coumadin for release 5 years. He has never had any adverse bleeding evidence on Coumadin except for some nosebleeds.  The patient has not been admitted to the hospital for heart failure but has clinically deteriorated on maximal medical therapy and has been followed by the heart failure clinic and has been very compliant.  The patient also has postprandial abdominal discomfort and extreme leg weakness with ambulation. The patient had an emergency laparotomy 1969 for small bowel obstruction, details are limited.  He had a CTX evaluation with max VO2 9.2. A 2-D echocardiogram performed last month showed EF 25% with mild RV dysfunction, moderate ischemic mitral regurgitation and no significant AI. The patient has not had a right or left heart cath.  The patient did well after his CABG in 2003 and was hospitalized approximately one week. He has had his AICD generator change one time.  Past Medical History  Diagnosis Date  . Ischemic cardiomyopathy      CABG 2003, PCI 2007  EF 27%(myoview 2012  . CHF (congestive heart failure)     class I-II  . Sleep apnea     ?  . Intestine disorder     blockage  . ICD (implantable cardiac defibrillator) in  place   . Hyperlipidemia   . Hypothyroidism   . Chronic anticoagulation   . Obesity   . COPD (chronic obstructive pulmonary disease)   . Asbestosis   . Atrial fibrillation     paroxysmal; on coumadin  . Paroxysmal ventricular tachycardia     has ICD in place  . Coronary artery disease     Past Surgical History  Procedure Laterality Date  . Coronary artery bypass graft    . Cardiac defibrillator placement      Family History  Problem Relation Age of Onset  . Heart attack Mother   . Heart attack Father     Social History History  Substance Use Topics  . Smoking status: Former Smoker -- 2.00 packs/day for 45 years    Types: Cigarettes    Quit date: 11/11/2001  . Smokeless tobacco: Not on file  . Alcohol Use: No    Current Outpatient Prescriptions  Medication Sig Dispense Refill  . albuterol (PROVENTIL) (2.5 MG/3ML) 0.083% nebulizer solution Take 2.5 mg by nebulization every 6 (six) hours as needed.        . budesonide-formoterol (SYMBICORT) 160-4.5 MCG/ACT inhaler Inhale 2 puffs into the lungs 2 (two) times daily.      . carvedilol (COREG) 12.5 MG tablet Take 12.5 mg by mouth 2 (two) times daily with a meal.      . docusate sodium (COLACE) 100 MG capsule Take 100 mg by mouth 2 (two) times daily.        .   Flora-Q (FLORA-Q) CAPS Take 1 capsule by mouth 2 (two) times daily. Taking 12mg      . levothyroxine (SYNTHROID, LEVOTHROID) 100 MCG tablet Take 100 mcg by mouth daily.        . lisinopril (PRINIVIL,ZESTRIL) 40 MG tablet Take 20 mg by mouth daily.        . nitroGLYCERIN (NITROSTAT) 0.4 MG SL tablet Place 0.4 mg under the tongue every 5 (five) minutes as needed.        . NON FORMULARY OXYGEN 3 liters qhs prn      . NON FORMULARY C-PAP use nightly      . ranitidine (ZANTAC) 150 MG tablet Take 150 mg by mouth 2 (two) times daily.        . simvastatin (ZOCOR) 80 MG tablet Take 40 mg by mouth at bedtime.        . tiotropium (SPIRIVA) 18 MCG inhalation capsule Place 18 mcg  into inhaler and inhale daily.       . warfarin (COUMADIN) 5 MG tablet Take 5 mg by mouth as directed.       . [DISCONTINUED] aspirin 81 MG tablet Take 81 mg by mouth daily.         No current facility-administered medications for this visit.    No Known Allergies  Review of Systems  Patient has total PA disability from asbestos exposure. PFTs were last performed 1-2 years ago with FEV1 of approximately 1.6. Repeat PFTs are pending.  The patient is right-hand dominant. He denies any prior history of mini stroke. No history of diabetes.  Patient has full dental plates upper and lower. He denies difficulty swallowing does have postprandial fullness or bloating. He has not lost weight  He denies any recent respiratory infections. No significant bleeding on Coumadin.    BP 100/63  Pulse 87  Resp 24  Ht 6' (1.829 m)  Wt 235 lb (106.595 kg)  BMI 31.86 kg/m2  SpO2 92% Physical Exam Gen. parents 72-year-old Caucasian male no acute distress. He is overweight. HEENT normocephalic pupils equal full dental plates Neck with out carotid bruit significant JVD or adenopathy Thorax well-healed sternal incision breath sounds diminished at the bases bilaterally Cardiac rhythm regular up murmur or gallop Abdomen obese soft nontender with well-healed right. Median scar Extremities no ulceration mild edema no tenderness or cyanosis Vascular nonpalpable pupils bilaterally Neuro normal gait no focal motor deficit Diagnostic Tests:   Impression: Advanced ischemic cardiomyopathy with failure of medical therapy unable to tolerate any normalactivity but no significant symptoms at rest. He has not responded to conventional oral medication carefully titrated to the advanced heart failure clinic. He wishes to preserve LVAD to improve quality of life and improve survival. He appears to be an acceptable candidate pending results of PFTs and repeat right left heart cath and peripheral Doppler exams of his  lower extremities and carotid arteries. Plan: Proceed with completion of workup then the patient would be reviewed by the LVAD team for final decision. 

## 2013-01-01 ENCOUNTER — Telehealth (HOSPITAL_COMMUNITY): Payer: Self-pay | Admitting: *Deleted

## 2013-01-01 ENCOUNTER — Encounter (HOSPITAL_COMMUNITY): Payer: Self-pay | Admitting: *Deleted

## 2013-01-01 NOTE — Telephone Encounter (Signed)
Per Dr Donata Clay pt needs L and R HC prior to surgery, ok per Dr Gala Romney to hold coumadin for 3 days prior, cath scheduled for 4/10 at 12:30 in main cath lab, pt's wife is aware and instructions reviewed with her via phone, pt will stop coumadin after Sunday night dose, will have labs check before procedure on 4/10

## 2013-01-05 ENCOUNTER — Ambulatory Visit (HOSPITAL_COMMUNITY)
Admission: RE | Admit: 2013-01-05 | Discharge: 2013-01-05 | Disposition: A | Discharge status: Home / Self Care | Payer: Medicare Other | Source: Ambulatory Visit | Attending: Cardiothoracic Surgery | Admitting: Cardiothoracic Surgery

## 2013-01-05 ENCOUNTER — Other Ambulatory Visit (HOSPITAL_COMMUNITY): Payer: Self-pay | Admitting: Cardiothoracic Surgery

## 2013-01-05 ENCOUNTER — Encounter (HOSPITAL_COMMUNITY): Payer: Self-pay

## 2013-01-05 DIAGNOSIS — I2589 Other forms of chronic ischemic heart disease: Secondary | ICD-10-CM | POA: Insufficient documentation

## 2013-01-05 DIAGNOSIS — I359 Nonrheumatic aortic valve disorder, unspecified: Secondary | ICD-10-CM

## 2013-01-05 DIAGNOSIS — I2510 Atherosclerotic heart disease of native coronary artery without angina pectoris: Secondary | ICD-10-CM

## 2013-01-05 DIAGNOSIS — J438 Other emphysema: Secondary | ICD-10-CM | POA: Insufficient documentation

## 2013-01-05 DIAGNOSIS — K7460 Unspecified cirrhosis of liver: Secondary | ICD-10-CM | POA: Insufficient documentation

## 2013-01-05 DIAGNOSIS — Z01810 Encounter for preprocedural cardiovascular examination: Secondary | ICD-10-CM

## 2013-01-05 DIAGNOSIS — Z01818 Encounter for other preprocedural examination: Secondary | ICD-10-CM | POA: Insufficient documentation

## 2013-01-05 DIAGNOSIS — K8020 Calculus of gallbladder without cholecystitis without obstruction: Secondary | ICD-10-CM | POA: Insufficient documentation

## 2013-01-05 DIAGNOSIS — Z01811 Encounter for preprocedural respiratory examination: Secondary | ICD-10-CM | POA: Insufficient documentation

## 2013-01-05 LAB — CBC
HCT: 32.4 % — ABNORMAL LOW (ref 39.0–52.0)
Hemoglobin: 10 g/dL — ABNORMAL LOW (ref 13.0–17.0)
MCH: 25.7 pg — ABNORMAL LOW (ref 26.0–34.0)
MCHC: 30.9 g/dL (ref 30.0–36.0)
MCV: 83.3 fL (ref 78.0–100.0)
Platelets: 131 10*3/uL — ABNORMAL LOW (ref 150–400)
RBC: 3.89 MIL/uL — ABNORMAL LOW (ref 4.22–5.81)
RDW: 17.1 % — ABNORMAL HIGH (ref 11.5–15.5)
WBC: 7.1 10*3/uL (ref 4.0–10.5)

## 2013-01-05 LAB — BASIC METABOLIC PANEL
BUN: 20 mg/dL (ref 6–23)
CO2: 27 mEq/L (ref 19–32)
Calcium: 10 mg/dL (ref 8.4–10.5)
Chloride: 108 mEq/L (ref 96–112)
Creatinine, Ser: 1.02 mg/dL (ref 0.50–1.35)
GFR calc Af Amer: 83 mL/min — ABNORMAL LOW (ref >90)
GFR calc non Af Amer: 72 mL/min — ABNORMAL LOW (ref >90)
Glucose, Bld: 121 mg/dL — ABNORMAL HIGH (ref 70–99)
Potassium: 4.6 mEq/L (ref 3.5–5.1)
Sodium: 142 mEq/L (ref 135–145)

## 2013-01-05 LAB — PULMONARY FUNCTION TEST

## 2013-01-05 LAB — PROTIME-INR
INR: 1.88 — ABNORMAL HIGH (ref 0.00–1.49)
Prothrombin Time: 20.9 seconds — ABNORMAL HIGH (ref 11.6–15.2)

## 2013-01-05 MED ORDER — IOHEXOL 300 MG/ML  SOLN
80.0000 mL | Freq: Once | INTRAMUSCULAR | Status: AC | PRN
  Administered 2013-01-05: 80 mL via INTRAVENOUS

## 2013-01-05 MED ORDER — ALBUTEROL SULFATE (5 MG/ML) 0.5% IN NEBU
2.5000 mg | INHALATION_SOLUTION | Freq: Once | RESPIRATORY_TRACT | Status: AC
  Administered 2013-01-05: 2.5 mg via RESPIRATORY_TRACT

## 2013-01-05 NOTE — Progress Notes (Signed)
VASCULAR LAB PRELIMINARY  PRELIMINARY  PRELIMINARY  PRELIMINARY  Pre-op Cardiac Surgery  Carotid Findings:  Bilateral:  No evidence of hemodynamically significant internal carotid artery stenosis.   Vertebral artery flow is antegrade.     Upper Extremity Right Left  Brachial Pressures 126 biphasic 120 Biphasic  Radial Waveforms Triphasic Triphasic  Ulnar Waveforms Monophasic Biphasic  Palmar Arch (Allen's Test) Normal Normal   Findings:  Doppler waveforms remained normal bilaterally with both radial and ulnar compressions    Lower  Extremity Right Left  Dorsalis Pedis 133 Biphasic 131 Biphasic      Posterior Tibial 131 Biphasic 112 Biphasic  Ankle/Brachial Indices 1.06 1.04    Findings:  ABIs and Doppler waveforms are within normal limits bilaterally at rest.   Jabree Pernice, RVS 01/05/2013, 2:18 PM

## 2013-01-07 ENCOUNTER — Ambulatory Visit (HOSPITAL_COMMUNITY)
Admission: RE | Admit: 2013-01-07 | Discharge: 2013-01-07 | Disposition: A | Discharge status: Home / Self Care | Payer: Medicare Other | Source: Ambulatory Visit | Attending: Internal Medicine | Admitting: Internal Medicine

## 2013-01-07 ENCOUNTER — Encounter (HOSPITAL_COMMUNITY)
Admission: RE | Disposition: A | Discharge status: Home / Self Care | Payer: Self-pay | Source: Ambulatory Visit | Attending: Internal Medicine

## 2013-01-07 DIAGNOSIS — J4489 Other specified chronic obstructive pulmonary disease: Secondary | ICD-10-CM | POA: Insufficient documentation

## 2013-01-07 DIAGNOSIS — Z95810 Presence of automatic (implantable) cardiac defibrillator: Secondary | ICD-10-CM | POA: Insufficient documentation

## 2013-01-07 DIAGNOSIS — Z79899 Other long term (current) drug therapy: Secondary | ICD-10-CM | POA: Insufficient documentation

## 2013-01-07 DIAGNOSIS — I2589 Other forms of chronic ischemic heart disease: Secondary | ICD-10-CM | POA: Insufficient documentation

## 2013-01-07 DIAGNOSIS — E669 Obesity, unspecified: Secondary | ICD-10-CM | POA: Insufficient documentation

## 2013-01-07 DIAGNOSIS — I509 Heart failure, unspecified: Secondary | ICD-10-CM | POA: Insufficient documentation

## 2013-01-07 DIAGNOSIS — E039 Hypothyroidism, unspecified: Secondary | ICD-10-CM | POA: Insufficient documentation

## 2013-01-07 DIAGNOSIS — G4730 Sleep apnea, unspecified: Secondary | ICD-10-CM | POA: Insufficient documentation

## 2013-01-07 DIAGNOSIS — I2510 Atherosclerotic heart disease of native coronary artery without angina pectoris: Secondary | ICD-10-CM

## 2013-01-07 DIAGNOSIS — I472 Ventricular tachycardia, unspecified: Secondary | ICD-10-CM | POA: Insufficient documentation

## 2013-01-07 DIAGNOSIS — Z9861 Coronary angioplasty status: Secondary | ICD-10-CM | POA: Insufficient documentation

## 2013-01-07 DIAGNOSIS — E785 Hyperlipidemia, unspecified: Secondary | ICD-10-CM | POA: Insufficient documentation

## 2013-01-07 DIAGNOSIS — J449 Chronic obstructive pulmonary disease, unspecified: Secondary | ICD-10-CM | POA: Insufficient documentation

## 2013-01-07 DIAGNOSIS — Z6831 Body mass index (BMI) 31.0-31.9, adult: Secondary | ICD-10-CM | POA: Insufficient documentation

## 2013-01-07 DIAGNOSIS — I4891 Unspecified atrial fibrillation: Secondary | ICD-10-CM | POA: Insufficient documentation

## 2013-01-07 DIAGNOSIS — I5022 Chronic systolic (congestive) heart failure: Secondary | ICD-10-CM

## 2013-01-07 DIAGNOSIS — I4729 Other ventricular tachycardia: Secondary | ICD-10-CM | POA: Insufficient documentation

## 2013-01-07 DIAGNOSIS — Z7901 Long term (current) use of anticoagulants: Secondary | ICD-10-CM | POA: Insufficient documentation

## 2013-01-07 DIAGNOSIS — Z87891 Personal history of nicotine dependence: Secondary | ICD-10-CM | POA: Insufficient documentation

## 2013-01-07 HISTORY — PX: LEFT AND RIGHT HEART CATHETERIZATION WITH CORONARY/GRAFT ANGIOGRAM: SHX5448

## 2013-01-07 LAB — POCT I-STAT 3, VENOUS BLOOD GAS (G3P V)
Bicarbonate: 21.5 mEq/L (ref 20.0–24.0)
TCO2: 23 mmol/L (ref 0–100)
pCO2, Ven: 42 mmHg — ABNORMAL LOW (ref 45.0–50.0)
pH, Ven: 7.317 — ABNORMAL HIGH (ref 7.250–7.300)
pO2, Ven: 34 mmHg (ref 30.0–45.0)

## 2013-01-07 LAB — BASIC METABOLIC PANEL
Calcium: 10 mg/dL (ref 8.4–10.5)
Chloride: 105 mEq/L (ref 96–112)
Creatinine, Ser: 0.98 mg/dL (ref 0.50–1.35)
GFR calc Af Amer: >90 mL/min (ref >90)

## 2013-01-07 LAB — PROTIME-INR: Prothrombin Time: 16.1 seconds — ABNORMAL HIGH (ref 11.6–15.2)

## 2013-01-07 SURGERY — LEFT AND RIGHT HEART CATHETERIZATION WITH CORONARY/GRAFT ANGIOGRAM

## 2013-01-07 MED ORDER — SODIUM CHLORIDE 0.9 % IJ SOLN
3.0000 mL | Freq: Two times a day (BID) | INTRAMUSCULAR | Status: DC
Start: 2013-01-07 — End: 2013-01-07

## 2013-01-07 MED ORDER — LIDOCAINE HCL (PF) 1 % IJ SOLN
INTRAMUSCULAR | Status: AC
  Filled 2013-01-07: qty 30

## 2013-01-07 MED ORDER — ONDANSETRON HCL 4 MG/2ML IJ SOLN: 4.0000 mg | Freq: Four times a day (QID) | INTRAMUSCULAR | Status: DC | PRN

## 2013-01-07 MED ORDER — ACETAMINOPHEN 325 MG PO TABS: 650.0000 mg | ORAL_TABLET | ORAL | Status: DC | PRN

## 2013-01-07 MED ORDER — ASPIRIN 81 MG PO CHEW
CHEWABLE_TABLET | ORAL | Status: AC
  Administered 2013-01-07: 324 mg
  Filled 2013-01-07: qty 4

## 2013-01-07 MED ORDER — ASPIRIN 81 MG PO CHEW: 324.0000 mg | CHEWABLE_TABLET | ORAL | Status: DC

## 2013-01-07 MED ORDER — SODIUM CHLORIDE 0.9 % IV SOLN: INTRAVENOUS | Status: DC

## 2013-01-07 MED ORDER — SODIUM CHLORIDE 0.9 % IV SOLN: 250.0000 mL | INTRAVENOUS | Status: DC | PRN

## 2013-01-07 MED ORDER — FENTANYL CITRATE 0.05 MG/ML IJ SOLN
INTRAMUSCULAR | Status: AC
  Filled 2013-01-07: qty 2

## 2013-01-07 MED ORDER — HEPARIN (PORCINE) IN NACL 2-0.9 UNIT/ML-% IJ SOLN
INTRAMUSCULAR | Status: AC
  Filled 2013-01-07: qty 1000

## 2013-01-07 MED ORDER — FENTANYL CITRATE 0.05 MG/ML IJ SOLN
25.0000 ug | Freq: Once | INTRAMUSCULAR | Status: AC
  Administered 2013-01-07: 25 ug via INTRAVENOUS

## 2013-01-07 MED ORDER — SODIUM CHLORIDE 0.9 % IJ SOLN: 3.0000 mL | INTRAMUSCULAR | Status: DC | PRN

## 2013-01-07 MED ORDER — MIDAZOLAM HCL 2 MG/2ML IJ SOLN
INTRAMUSCULAR | Status: AC
  Filled 2013-01-07: qty 2

## 2013-01-07 NOTE — Interval H&P Note (Signed)
History and Physical Interval Note:  01/07/2013 12:58 PM  [name]  has presented today for surgery, with the diagnosis of CHF  The various methods of treatment have been discussed with the patient and family. After consideration of risks, benefits and other options for treatment, the patient has consented to  Procedure(s): LEFT AND RIGHT HEART CATHETERIZATION WITH CORONARY ANGIOGRAM (N/A) as a surgical intervention .  The patient's history has been reviewed, patient examined, no change in status, stable for surgery.  I have reviewed the patient's chart and labs.  Questions were answered to the patient's satisfaction.     [name]

## 2013-01-07 NOTE — CV Procedure (Signed)
Cardiac Cath Procedure Note  Indication: HF, pre-VAD work-up  Procedures performed:  1) Right heart cathererization 2) Selective coronary angiography 3) LIMA angiography 4) SVG angiography x 2 5) Left heart catheterization 6) Left ventriculogram  Description of procedure:     The risks and indication of the procedure were explained. Consent was signed and placed on the chart. An appropriate timeout was taken prior to the procedure. The right groin was prepped and draped in the routine sterile fashion and anesthetized with 1% local lidocaine.   A 5 FR arterial sheath was placed in the right femoral artery using a modified Seldinger technique. Standard catheters including a JL4, JR4 and angled pigtail were used. All catheter exchanges were made over a wire. A 7 FR venous sheath was placed in the right femoral vein using a modified Seldinger technique. A standard Swan-Ganz catheter was used for the procedure.   Complications:  None apparent  Findings:   RA = 10 RV = 54/6/9 PA = 61/21 (36) PCW = 17 Fick cardiac output/index = 5.8/2.6 Thermo CO/CI = 4.2/1.9 PVR = 3.3 Woods FA sat = 98% PA sat = 59%, 60%  Ao Pressure: 97/51 (69) LV Pressure: 07/10/14 There was no signficant gradient across the aortic valve on pullback.  Left main: Long mild plaque throughout  LAD: totally occluded at ostium   LCX: 30% plaque throughout. OM-1 is grafted with competitive flow noted. OM-2 occluded in midsection. L->R collaterals to R PDA   RCA: Large. Dominant vessel. Diffusely diseased. Totally occluded after acute marginal branch. 70% proximal to mid lesion. 50% mid and 50% distal.  LIMA - mLAD: Widely patent. Native LAD diffusely diseased.   SVG - distal RCA: Patent  SVG - OM-1: Patent  LV-gram done in the RAO projection: Ejection fraction = 20%. Hypokinesis of proximal to mid anterior wall. Apex dyskinetic. Inferior wall akinetic.   Assessment: 1. Severe native CAD 2. All grafts  patent 3. Severe ischemic CM with EF 20% 4. Mild PAH with just mildly elevated PVR 5. Low cardiac output  Plan/Discussion:  His revascularization is intact. He has severe LV dysfunction with EF 20% and NYHA IV heart failure symptoms. Will proceed with VAD workup. Given mildly elevated PVR may benefit from admission for 2-3 day milrinone tune-up prior to VAD placement.    Arvilla Meres, MD 1:37 PM

## 2013-01-07 NOTE — H&P (View-Only) (Signed)
PCP is Thayer Headings, MD Referring Provider is Bensimhon, Bevelyn Buckles, MD  Chief Complaint  Patient presents with  . Congestive Heart Failure    to discuss VAD placemnt    HPI: [name] reformed smoker with ischemic cardiomyopathy, ejection fraction of approximately 25%, severe class IIIB symptoms unable to do any ordinary activities without severe shortness of breath and weakness. This has been progressive over the past 3 months. He denies any angina. He denies orthopnea or PND. No significant ankle edema.  He is status post CABG x3 in 2003-LIMA to LAD, vein graft to OM, vein graft to RCA. His EF at that time was approximately 30% and postop he had an AICD placed but he has never been shocked . He is been in atrial fibrillation on Coumadin for release 5 years. He has never had any adverse bleeding evidence on Coumadin except for some nosebleeds.  The patient has not been admitted to the hospital for heart failure but has clinically deteriorated on maximal medical therapy and has been followed by the heart failure clinic and has been very compliant.  The patient also has postprandial abdominal discomfort and extreme leg weakness with ambulation. The patient had an emergency laparotomy 1969 for small bowel obstruction, details are limited.  He had a CTX evaluation with max VO2 9.2. A 2-D echocardiogram performed last month showed EF 25% with mild RV dysfunction, moderate ischemic mitral regurgitation and no significant AI. The patient has not had a right or left heart cath.  The patient did well after his CABG in 2003 and was hospitalized approximately one week. He has had his AICD generator change one time.  Past Medical History  Diagnosis Date  . Ischemic cardiomyopathy      CABG 2003, PCI 2007  EF 27%(myoview 2012  . CHF (congestive heart failure)     class I-II  . Sleep apnea     ?  [name] Intestine disorder     blockage  . ICD (implantable cardiac defibrillator) in  place   . Hyperlipidemia   . Hypothyroidism   . Chronic anticoagulation   . Obesity   . COPD (chronic obstructive pulmonary disease)   . Asbestosis   . Atrial fibrillation     paroxysmal; on coumadin  . Paroxysmal ventricular tachycardia     has ICD in place  . Coronary artery disease     Past Surgical History  Procedure Laterality Date  . Coronary artery bypass graft    . Cardiac defibrillator placement      Family History  Problem Relation Age of Onset  . Heart attack Mother   . Heart attack Father     Social History History  Substance Use Topics  . Smoking status: Former Smoker -- 2.00 packs/day for 45 years    Types: Cigarettes    Quit date: 11/11/2001  . Smokeless tobacco: Not on file  . Alcohol Use: No    Current Outpatient Prescriptions  Medication Sig Dispense Refill  . albuterol (PROVENTIL) (2.5 MG/3ML) 0.083% nebulizer solution Take 2.5 mg by nebulization every 6 (six) hours as needed.        . budesonide-formoterol (SYMBICORT) 160-4.5 MCG/ACT inhaler Inhale 2 puffs into the lungs 2 (two) times daily.      . carvedilol (COREG) 12.5 MG tablet Take 12.5 mg by mouth 2 (two) times daily with a meal.      . docusate sodium (COLACE) 100 MG capsule Take 100 mg by mouth 2 (two) times daily.        [name]  Flora-Q (FLORA-Q) CAPS Take 1 capsule by mouth 2 (two) times daily. Taking 12mg       . levothyroxine (SYNTHROID, LEVOTHROID) 100 MCG tablet Take 100 mcg by mouth daily.        [name] lisinopril (PRINIVIL,ZESTRIL) 40 MG tablet Take 20 mg by mouth daily.        . nitroGLYCERIN (NITROSTAT) 0.4 MG SL tablet Place 0.4 mg under the tongue every 5 (five) minutes as needed.        . NON FORMULARY OXYGEN 3 liters qhs prn      . NON FORMULARY C-PAP use nightly      . ranitidine (ZANTAC) 150 MG tablet Take 150 mg by mouth 2 (two) times daily.        . simvastatin (ZOCOR) 80 MG tablet Take 40 mg by mouth at bedtime.        [name] tiotropium (SPIRIVA) 18 MCG inhalation capsule Place 18 mcg  into inhaler and inhale daily.       [name] warfarin (COUMADIN) 5 MG tablet Take 5 mg by mouth as directed.       . [DISCONTINUED] aspirin 81 MG tablet Take 81 mg by mouth daily.         No current facility-administered medications for this visit.    No Known Allergies  Review of Systems  Patient has total PA disability from asbestos exposure. PFTs were last performed 1-2 years ago with FEV1 of approximately 1.6. Repeat PFTs are pending.  The patient is right-hand dominant. He denies any prior history of mini stroke. No history of diabetes.  Patient has full dental plates upper and lower. He denies difficulty swallowing does have postprandial fullness or bloating. He has not lost weight  He denies any recent respiratory infections. No significant bleeding on Coumadin.    BP 100/63  Pulse 87  Resp 24  Ht 6' (1.829 m)  Wt 235 lb (106.595 kg)  BMI 31.86 kg/m2  SpO2 92% Physical Exam Gen. parents 40 year old Caucasian male no acute distress. He is overweight. HEENT normocephalic pupils equal full dental plates Neck with out carotid bruit significant JVD or adenopathy Thorax well-healed sternal incision breath sounds diminished at the bases bilaterally Cardiac rhythm regular up murmur or gallop Abdomen obese soft nontender with well-healed right. Median scar Extremities no ulceration mild edema no tenderness or cyanosis Vascular nonpalpable pupils bilaterally Neuro normal gait no focal motor deficit Diagnostic Tests:   Impression: Advanced ischemic cardiomyopathy with failure of medical therapy unable to tolerate any normalactivity but no significant symptoms at rest. He has not responded to conventional oral medication carefully titrated to the advanced heart failure clinic. He wishes to preserve LVAD to improve quality of life and improve survival. He appears to be an acceptable candidate pending results of PFTs and repeat right left heart cath and peripheral Doppler exams of his  lower extremities and carotid arteries. Plan: Proceed with completion of workup then the patient would be reviewed by the LVAD team for final decision.

## 2013-01-08 ENCOUNTER — Encounter (HOSPITAL_COMMUNITY): Payer: Self-pay | Admitting: Anesthesiology

## 2013-01-08 LAB — POCT I-STAT 3, ART BLOOD GAS (G3+)
Acid-base deficit: 2 mmol/L (ref 0.0–2.0)
O2 Saturation: 98 %
TCO2: 23 mmol/L (ref 0–100)

## 2013-01-08 LAB — POCT I-STAT 3, VENOUS BLOOD GAS (G3P V): pH, Ven: 7.335 — ABNORMAL HIGH (ref 7.250–7.300)

## 2013-01-08 NOTE — Progress Notes (Signed)
This encounter was created in error - please disregard.

## 2013-01-10 ENCOUNTER — Inpatient Hospital Stay (HOSPITAL_COMMUNITY): Payer: Medicare Other

## 2013-01-10 ENCOUNTER — Encounter (HOSPITAL_COMMUNITY): Payer: Self-pay | Admitting: Anesthesiology

## 2013-01-10 ENCOUNTER — Inpatient Hospital Stay (HOSPITAL_COMMUNITY)
Admission: AD | Admit: 2013-01-10 | Discharge: 2013-01-22 | DRG: 001 | Disposition: A | Discharge status: Home Health | Payer: Medicare Other | Source: Ambulatory Visit | Attending: Cardiothoracic Surgery | Admitting: Cardiothoracic Surgery

## 2013-01-10 ENCOUNTER — Encounter (HOSPITAL_COMMUNITY): Payer: Self-pay | Admitting: *Deleted

## 2013-01-10 DIAGNOSIS — I4891 Unspecified atrial fibrillation: Secondary | ICD-10-CM | POA: Diagnosis present

## 2013-01-10 DIAGNOSIS — R7309 Other abnormal glucose: Secondary | ICD-10-CM | POA: Diagnosis not present

## 2013-01-10 DIAGNOSIS — R570 Cardiogenic shock: Secondary | ICD-10-CM | POA: Diagnosis not present

## 2013-01-10 DIAGNOSIS — I272 Pulmonary hypertension, unspecified: Secondary | ICD-10-CM

## 2013-01-10 DIAGNOSIS — Z951 Presence of aortocoronary bypass graft: Secondary | ICD-10-CM

## 2013-01-10 DIAGNOSIS — Z79899 Other long term (current) drug therapy: Secondary | ICD-10-CM

## 2013-01-10 DIAGNOSIS — K648 Other hemorrhoids: Secondary | ICD-10-CM | POA: Diagnosis not present

## 2013-01-10 DIAGNOSIS — E669 Obesity, unspecified: Secondary | ICD-10-CM | POA: Diagnosis present

## 2013-01-10 DIAGNOSIS — E871 Hypo-osmolality and hyponatremia: Secondary | ICD-10-CM | POA: Diagnosis not present

## 2013-01-10 DIAGNOSIS — J9690 Respiratory failure, unspecified, unspecified whether with hypoxia or hypercapnia: Secondary | ICD-10-CM

## 2013-01-10 DIAGNOSIS — R0600 Dyspnea, unspecified: Secondary | ICD-10-CM

## 2013-01-10 DIAGNOSIS — T8111XA Postprocedural  cardiogenic shock, initial encounter: Secondary | ICD-10-CM

## 2013-01-10 DIAGNOSIS — R531 Weakness: Secondary | ICD-10-CM

## 2013-01-10 DIAGNOSIS — R942 Abnormal results of pulmonary function studies: Secondary | ICD-10-CM

## 2013-01-10 DIAGNOSIS — Z8249 Family history of ischemic heart disease and other diseases of the circulatory system: Secondary | ICD-10-CM

## 2013-01-10 DIAGNOSIS — J9600 Acute respiratory failure, unspecified whether with hypoxia or hypercapnia: Secondary | ICD-10-CM

## 2013-01-10 DIAGNOSIS — I5022 Chronic systolic (congestive) heart failure: Secondary | ICD-10-CM

## 2013-01-10 DIAGNOSIS — D62 Acute posthemorrhagic anemia: Secondary | ICD-10-CM | POA: Diagnosis not present

## 2013-01-10 DIAGNOSIS — J438 Other emphysema: Secondary | ICD-10-CM | POA: Diagnosis present

## 2013-01-10 DIAGNOSIS — R0609 Other forms of dyspnea: Secondary | ICD-10-CM

## 2013-01-10 DIAGNOSIS — E876 Hypokalemia: Secondary | ICD-10-CM | POA: Diagnosis not present

## 2013-01-10 DIAGNOSIS — I2789 Other specified pulmonary heart diseases: Secondary | ICD-10-CM | POA: Diagnosis present

## 2013-01-10 DIAGNOSIS — E039 Hypothyroidism, unspecified: Secondary | ICD-10-CM | POA: Diagnosis present

## 2013-01-10 DIAGNOSIS — J61 Pneumoconiosis due to asbestos and other mineral fibers: Secondary | ICD-10-CM | POA: Diagnosis present

## 2013-01-10 DIAGNOSIS — I2510 Atherosclerotic heart disease of native coronary artery without angina pectoris: Secondary | ICD-10-CM | POA: Diagnosis present

## 2013-01-10 DIAGNOSIS — L7682 Other postprocedural complications of skin and subcutaneous tissue: Secondary | ICD-10-CM

## 2013-01-10 DIAGNOSIS — Z95811 Presence of heart assist device: Secondary | ICD-10-CM

## 2013-01-10 DIAGNOSIS — Z95810 Presence of automatic (implantable) cardiac defibrillator: Secondary | ICD-10-CM

## 2013-01-10 DIAGNOSIS — E785 Hyperlipidemia, unspecified: Secondary | ICD-10-CM | POA: Diagnosis present

## 2013-01-10 DIAGNOSIS — Z7901 Long term (current) use of anticoagulants: Secondary | ICD-10-CM

## 2013-01-10 DIAGNOSIS — I509 Heart failure, unspecified: Secondary | ICD-10-CM

## 2013-01-10 DIAGNOSIS — Z7982 Long term (current) use of aspirin: Secondary | ICD-10-CM

## 2013-01-10 DIAGNOSIS — J449 Chronic obstructive pulmonary disease, unspecified: Secondary | ICD-10-CM

## 2013-01-10 DIAGNOSIS — G4733 Obstructive sleep apnea (adult) (pediatric): Secondary | ICD-10-CM | POA: Diagnosis present

## 2013-01-10 DIAGNOSIS — I5023 Acute on chronic systolic (congestive) heart failure: Principal | ICD-10-CM

## 2013-01-10 DIAGNOSIS — I472 Ventricular tachycardia: Secondary | ICD-10-CM

## 2013-01-10 DIAGNOSIS — J95821 Acute postprocedural respiratory failure: Secondary | ICD-10-CM | POA: Diagnosis not present

## 2013-01-10 DIAGNOSIS — R110 Nausea: Secondary | ICD-10-CM | POA: Diagnosis not present

## 2013-01-10 DIAGNOSIS — I2589 Other forms of chronic ischemic heart disease: Secondary | ICD-10-CM | POA: Diagnosis present

## 2013-01-10 DIAGNOSIS — F329 Major depressive disorder, single episode, unspecified: Secondary | ICD-10-CM

## 2013-01-10 DIAGNOSIS — Z87891 Personal history of nicotine dependence: Secondary | ICD-10-CM

## 2013-01-10 DIAGNOSIS — F3289 Other specified depressive episodes: Secondary | ICD-10-CM | POA: Diagnosis present

## 2013-01-10 LAB — CBC
HCT: 30.3 % — ABNORMAL LOW (ref 39.0–52.0)
Hemoglobin: 9.1 g/dL — ABNORMAL LOW (ref 13.0–17.0)
MCH: 24.9 pg — ABNORMAL LOW (ref 26.0–34.0)
MCHC: 30 g/dL (ref 30.0–36.0)
MCV: 83 fL (ref 78.0–100.0)
Platelets: 145 10*3/uL — ABNORMAL LOW (ref 150–400)
RBC: 3.65 MIL/uL — ABNORMAL LOW (ref 4.22–5.81)
RDW: 16.8 % — ABNORMAL HIGH (ref 11.5–15.5)
WBC: 6.8 10*3/uL (ref 4.0–10.5)

## 2013-01-10 LAB — ABO/RH: ABO/RH(D): O POS

## 2013-01-10 LAB — COMPREHENSIVE METABOLIC PANEL
ALT: 9 U/L (ref 0–53)
Albumin: 3.3 g/dL — ABNORMAL LOW (ref 3.5–5.2)
Alkaline Phosphatase: 45 U/L (ref 39–117)
Potassium: 4.3 mEq/L (ref 3.5–5.1)
Sodium: 139 mEq/L (ref 135–145)
Total Protein: 6.7 g/dL (ref 6.0–8.3)

## 2013-01-10 LAB — PRO B NATRIURETIC PEPTIDE: Pro B Natriuretic peptide (BNP): 763.5 pg/mL — ABNORMAL HIGH (ref 0–125)

## 2013-01-10 LAB — HEPARIN LEVEL (UNFRACTIONATED): Heparin Unfractionated: 0.19 IU/mL — ABNORMAL LOW (ref 0.30–0.70)

## 2013-01-10 LAB — CARBOXYHEMOGLOBIN: Total hemoglobin: 9.4 g/dL — ABNORMAL LOW (ref 13.5–18.0)

## 2013-01-10 MED ORDER — LISINOPRIL 20 MG PO TABS
20.0000 mg | ORAL_TABLET | Freq: Every day | ORAL | Status: DC
  Administered 2013-01-11: 20 mg via ORAL
  Filled 2013-01-10 (×3): qty 1

## 2013-01-10 MED ORDER — DIAZEPAM 5 MG PO TABS
2.5000 mg | ORAL_TABLET | Freq: Four times a day (QID) | ORAL | Status: DC | PRN
  Administered 2013-01-10 – 2013-01-11 (×3): 2.5 mg via ORAL
  Filled 2013-01-10 (×4): qty 1

## 2013-01-10 MED ORDER — BUDESONIDE-FORMOTEROL FUMARATE 160-4.5 MCG/ACT IN AERO
2.0000 | INHALATION_SPRAY | Freq: Two times a day (BID) | RESPIRATORY_TRACT | Status: DC
  Administered 2013-01-10 – 2013-01-22 (×20): 2 via RESPIRATORY_TRACT
  Filled 2013-01-10 (×2): qty 6

## 2013-01-10 MED ORDER — ATORVASTATIN CALCIUM 40 MG PO TABS
40.0000 mg | ORAL_TABLET | Freq: Every day | ORAL | Status: DC
  Administered 2013-01-10 – 2013-01-11 (×2): 40 mg via ORAL
  Filled 2013-01-10 (×4): qty 1

## 2013-01-10 MED ORDER — DOCUSATE SODIUM 100 MG PO CAPS
200.0000 mg | ORAL_CAPSULE | Freq: Every day | ORAL | Status: DC
  Administered 2013-01-10 – 2013-01-11 (×2): 200 mg via ORAL
  Filled 2013-01-10 (×3): qty 2

## 2013-01-10 MED ORDER — FENTANYL CITRATE 0.05 MG/ML IJ SOLN
INTRAMUSCULAR | Status: AC
  Filled 2013-01-10: qty 2

## 2013-01-10 MED ORDER — MIDAZOLAM HCL 2 MG/2ML IJ SOLN
INTRAMUSCULAR | Status: AC
  Administered 2013-01-10: 2 mg
  Filled 2013-01-10: qty 2

## 2013-01-10 MED ORDER — TECHNETIUM TO 99M ALBUMIN AGGREGATED
6.0000 | Freq: Once | INTRAVENOUS | Status: AC | PRN
  Administered 2013-01-10: 6 via INTRAVENOUS

## 2013-01-10 MED ORDER — SODIUM CHLORIDE 0.9 % IV SOLN
250.0000 mL | INTRAVENOUS | Status: DC | PRN
  Administered 2013-01-10 – 2013-01-12 (×2): via INTRAVENOUS

## 2013-01-10 MED ORDER — ENOXAPARIN SODIUM 30 MG/0.3ML ~~LOC~~ SOLN: 30.0000 mg | Freq: Two times a day (BID) | SUBCUTANEOUS | Status: DC

## 2013-01-10 MED ORDER — MIDAZOLAM BOLUS VIA INFUSION
3.0000 mg | Freq: Once | INTRAVENOUS | Status: AC
  Filled 2013-01-10: qty 3

## 2013-01-10 MED ORDER — SODIUM CHLORIDE 0.9 % IJ SOLN: 3.0000 mL | INTRAMUSCULAR | Status: DC | PRN

## 2013-01-10 MED ORDER — HEPARIN (PORCINE) IN NACL 100-0.45 UNIT/ML-% IJ SOLN
1850.0000 [IU]/h | INTRAMUSCULAR | Status: DC
  Administered 2013-01-10: 1400 [IU]/h via INTRAVENOUS
  Administered 2013-01-11: 1850 [IU]/h via INTRAVENOUS
  Filled 2013-01-10 (×4): qty 250

## 2013-01-10 MED ORDER — MIDAZOLAM HCL 2 MG/2ML IJ SOLN
INTRAMUSCULAR | Status: AC
  Administered 2013-01-10: 1 mg
  Filled 2013-01-10: qty 2

## 2013-01-10 MED ORDER — TECHNETIUM TC 99M DIETHYLENETRIAME-PENTAACETIC ACID
40.0000 | Freq: Once | INTRAVENOUS | Status: AC | PRN
  Administered 2013-01-10: 40 via RESPIRATORY_TRACT

## 2013-01-10 MED ORDER — DIAZEPAM 5 MG PO TABS
ORAL_TABLET | ORAL | Status: AC
  Administered 2013-01-10: 2.5 mg
  Filled 2013-01-10: qty 1

## 2013-01-10 MED ORDER — DIAZEPAM 5 MG PO TABS: 2.5000 mg | ORAL_TABLET | Freq: Once | ORAL | Status: AC

## 2013-01-10 MED ORDER — FENTANYL CITRATE 0.05 MG/ML IJ SOLN
25.0000 ug | Freq: Once | INTRAMUSCULAR | Status: AC
  Administered 2013-01-10: 25 ug via INTRAVENOUS

## 2013-01-10 MED ORDER — LEVOTHYROXINE SODIUM 50 MCG PO TABS
50.0000 ug | ORAL_TABLET | Freq: Every day | ORAL | Status: DC
  Administered 2013-01-10 – 2013-01-21 (×11): 50 ug via ORAL
  Filled 2013-01-10 (×13): qty 1

## 2013-01-10 MED ORDER — ACETAMINOPHEN 325 MG PO TABS: 650.0000 mg | ORAL_TABLET | ORAL | Status: DC | PRN

## 2013-01-10 MED ORDER — TIOTROPIUM BROMIDE MONOHYDRATE 18 MCG IN CAPS
18.0000 ug | ORAL_CAPSULE | Freq: Every day | RESPIRATORY_TRACT | Status: DC
  Administered 2013-01-11 – 2013-01-22 (×8): 18 ug via RESPIRATORY_TRACT
  Filled 2013-01-10 (×3): qty 5

## 2013-01-10 MED ORDER — MILRINONE IN DEXTROSE 20 MG/100ML IV SOLN
0.2500 ug/kg/min | INTRAVENOUS | Status: DC
  Administered 2013-01-10 – 2013-01-11 (×3): 0.25 ug/kg/min via INTRAVENOUS
  Filled 2013-01-10 (×4): qty 100

## 2013-01-10 MED ORDER — SODIUM CHLORIDE 0.9 % IJ SOLN
3.0000 mL | Freq: Two times a day (BID) | INTRAMUSCULAR | Status: DC
  Administered 2013-01-10 (×2): 3 mL via INTRAVENOUS

## 2013-01-10 MED ORDER — ONDANSETRON HCL 4 MG/2ML IJ SOLN: 4.0000 mg | Freq: Four times a day (QID) | INTRAMUSCULAR | Status: DC | PRN

## 2013-01-10 NOTE — Consult Note (Signed)
Anesthesiology Pre-operative Note:  [name] is a 72 year old male with severe ischemic cardiomyopathy, AICD, atrial fibrillation, COPD, OSA on CPAP. Now with Class IV HF symptoms and he is scheduled to undergo Heartmate II LVAD implantation on 4/14 by Dr. Morton Peters.  CABG: 2003  AICD insertion 2003 no discharges, generator changed once.  ECHO 12/23/12:  LV: EF 25-30% Thinning with akinesis of the anterior septum. Apex is akinetic. There is motion of the basal inferior, basal inferolateral, and basal/mid anterolateral segments. LV end diastolic diameter: 73.6 mm.  Aortic Valve: No stenosis, No AI  Mitral Valve: Moderate MR, leaflets grossly normal  RV:  normal size, mild reduced systolic function  R. Heart Cath today: RA: 10 RV: 54/6/9 PA: 61/21 PCWP: 17 CO/CI 4.2/1.9  H/H 9.1/30.3 Platelets: 145,000 WBC: 6,800 Na 139 K- 4.3 BUN/Cr. 16/0.97  CXR: stable cardiomegaly without pulmonary edema  Anesthetic plan discussed questions answered. I explained that he will almost certainly remain intubated for the first night after surgery.  Kipp Brood, MD

## 2013-01-10 NOTE — H&P (Addendum)
Advanced Heart Failure Team History and Physical Note   Primary Physician: Dr. Elease Hashimoto PCP [facility]: Dr. Cherie Ouch PCP: Dr. Leanora Ivanoff Cardiologist: Dr. Elease Hashimoto   HPI:    [name] is a 73 year old gentleman with a history of CABG (2003), Chronic systolic HF EF 32%, S/P AICD (generator change 2011), hyperlipidemia, hypothyroidism, emphysema, OSA, intermittent atrial fibrillation (on coumadin). Quit smoking 2003. Uses CPAP and O2 every night. He is a disabled [employer].  He was referred to the AHF clinic by Dr. Elease Hashimoto and over the past couple of months his functional capacity has really declined. He has class IV heart failure symptoms and has not improved on OMM. His CPX in 11/13 showed a VO2 of 9.2%, VE/VCO2- 52.8, OUES 1.15 and RER= 1.02. We have been following him closely and have had multiple discussions about his options and the patient decided that he wanted to proceed with LVAD placement.   Cath 01/06/13 RA = 10  RV = 54/6/9  PA = 61/21 (36)  PCW = 17  Fick cardiac output/index = 5.8/2.6  Thermo CO/CI = 4.2/1.9  PVR = 3.3 Woods  FA sat = 98%  PA sat = 59%, 60%  Severe native CAD with all grafts patent. EF 20%  He comes in from home today for pre-optimization of his HF before implantation of the LVAD. Over past few days HF has been getting worse. The patient complains of abdominal distention, no appetite, SOB at rest and with minimal exertion and overall fatigue. Anxious this am.    Review of Systems: [y] = yes, [ ]  = no   General: Weight gain [ ] ; Weight loss [ Y]; Anorexia [ ] ; Fatigue [ ] ; Fever [ ] ; Chills [ ] ; Weakness [ ]   Cardiac: Chest pain/pressure [ ] ; Resting SOB [ ] ; Exertional SOB [ Y]; Orthopnea [Y ]; Pedal Edema [ ] ; Palpitations [ ] ; Syncope [ ] ; Presyncope [ ] ; Paroxysmal nocturnal dyspnea[ ]   Pulmonary: Cough [ ] ; Wheezing[ ] ; Hemoptysis[ ] ; Sputum [ ] ; Snoring [ ]   GI: Vomiting[ ] ; Dysphagia[ ] ; Melena[ ] ; Hematochezia [ ] ; Heartburn[ ] ; Abdominal pain [ ] ;  Constipation [ ] ; Diarrhea [ ] ; BRBPR [ ]   GU: Hematuria[ ] ; Dysuria [ ] ; Nocturia[ ]   Vascular: Pain in legs with walking [ ] ; Pain in feet with lying flat [ ] ; Non-healing sores [ ] ; Stroke [ ] ; TIA [ ] ; Slurred speech [ ] ;  Neuro: Headaches[ ] ; Vertigo[ ] ; Seizures[ ] ; Paresthesias[ ] ;Blurred vision [ ] ; Diplopia [ ] ; Vision changes [ ]   Ortho/Skin: Arthritis [ ] ; Joint pain [ ] ; Muscle pain [ ] ; Joint swelling [ ] ; Back Pain [ ] ; Rash [ ]   Psych: Depression[ ] ; Anxiety[ ]   Heme: Bleeding problems [ ] ; Clotting disorders [ ] ; Anemia [ ]   Endocrine: Diabetes [ ] ; Thyroid dysfunction[ ]   Home Medications Prior to Admission medications   Medication Sig Start Date End Date Taking? Authorizing Provider  albuterol (PROVENTIL) (2.5 MG/3ML) 0.083% nebulizer solution Take 2.5 mg by nebulization every 6 (six) hours as needed.      Historical Provider, MD  budesonide-formoterol (SYMBICORT) 160-4.5 MCG/ACT inhaler Inhale 2 puffs into the lungs 2 (two) times daily.    Historical Provider, MD  carvedilol (COREG) 12.5 MG tablet Take 12.5 mg by mouth 2 (two) times daily with a meal.    Historical Provider, MD  docusate sodium (COLACE) 100 MG capsule Take 200 mg by mouth at bedtime.     Historical Provider, MD  levothyroxine (SYNTHROID,  LEVOTHROID) 100 MCG tablet Take 50 mcg by mouth at bedtime.     Historical Provider, MD  lisinopril (PRINIVIL,ZESTRIL) 40 MG tablet Take 20 mg by mouth at bedtime.     Historical Provider, MD  nitroGLYCERIN (NITROSTAT) 0.4 MG SL tablet Place 0.4 mg under the tongue every 5 (five) minutes as needed.      Historical Provider, MD  simvastatin (ZOCOR) 80 MG tablet Take 40 mg by mouth at bedtime.      Historical Provider, MD  tiotropium (SPIRIVA) 18 MCG inhalation capsule Place 18 mcg into inhaler and inhale daily.     Historical Provider, MD  warfarin (COUMADIN) 5 MG tablet Take 5 mg by mouth daily. Takes 5mg  every day, except thurs-takes 2.5mg .    Historical Provider, MD     Past Medical History: Past Medical History  Diagnosis Date  . Ischemic cardiomyopathy      CABG 2003, PCI 2007  EF 27%(myoview 2012  . CHF (congestive heart failure)     class I-II  . Sleep apnea     ?  [name] Intestine disorder     blockage  . ICD (implantable cardiac defibrillator) in place   . Hyperlipidemia   . Hypothyroidism   . Chronic anticoagulation   . Obesity   . COPD (chronic obstructive pulmonary disease)   . Asbestosis   . Atrial fibrillation     paroxysmal; on coumadin  . Paroxysmal ventricular tachycardia     has ICD in place  . Coronary artery disease     Past Surgical History: Past Surgical History  Procedure Laterality Date  . Coronary artery bypass graft    . Cardiac defibrillator placement      Family History: Family History  Problem Relation Age of Onset  . Heart attack Mother   . Heart attack Father     Social History: History   Social History  . Marital Status: Married    Spouse Name: N/A    Number of Children: N/A  . Years of Education: N/A   Social History Main Topics  . Smoking status: Former Smoker -- 2.00 packs/day for 45 years    Types: Cigarettes    Quit date: 11/11/2001  . Smokeless tobacco: Not on file  . Alcohol Use: No  . Drug Use: No  . Sexually Active: Not Currently   Other Topics Concern  . Not on file   Social History Narrative  . No narrative on file    Allergies:  No Known Allergies  Objective:    Vital Signs:   Resp:  [21-28] 23 (04/13 1200) BP: (106-112)/(53-58) 106/53 mmHg (04/13 1200) Weight:  [101.6 kg (223 lb 15.8 oz)] 101.6 kg (223 lb 15.8 oz) (04/13 1000) Last BM Date: 01/10/13 Filed Weights   01/10/13 1000  Weight: 101.6 kg (223 lb 15.8 oz)    Physical Exam: General:  Lying in bed. Mildly anxious. No resp difficulty HEENT: normal Neck: supple. JVP hard to see. Looks about 9-10 . Carotids 2+ bilat; no bruits. No lymphadenopathy or thryomegaly appreciated. Cor: PMI laterally displaced.  Irregular rate & rhythm. No rubs, gallops or murmurs. Lungs: decreased air movement throughout. No wheeze.  Abdomen: soft, nontender, nondistended. No hepatosplenomegaly. No bruits or masses. Good bowel sounds. Extremities: no cyanosis, clubbing, rash, edema Neuro: alert & orientedx3, cranial nerves grossly intact. moves all 4 extremities w/o difficulty. Affect pleasant  Telemetry: AF 60-70  Labs: Basic Metabolic Panel:  Recent Labs Lab 01/05/13 0811 01/07/13 1016 01/10/13 1048  NA 142  137 139  K 4.6 4.6 4.3  CL 108 105 106  CO2 27 25 26   GLUCOSE 121* 112* 109*  BUN 20 18 16   CREATININE 1.02 0.98 0.97  CALCIUM 10.0 10.0 9.7    Liver Function Tests:  Recent Labs Lab 01/10/13 1048  AST 20  ALT 9  ALKPHOS 45  BILITOT 0.6  PROT 6.7  ALBUMIN 3.3*   No results found for this basename: LIPASE, AMYLASE,  in the last 168 hours No results found for this basename: AMMONIA,  in the last 168 hours  CBC:  Recent Labs Lab 01/05/13 0811  WBC 7.1  HGB 10.0*  HCT 32.4*  MCV 83.3  PLT 131*    Cardiac Enzymes: No results found for this basename: CKTOTAL, CKMB, CKMBINDEX, TROPONINI,  in the last 168 hours  BNP: BNP (last 3 results)  Recent Labs  07/02/12 0837 01/10/13 1048  PROBNP 149.0* 763.5*    CBG: No results found for this basename: GLUCAP,  in the last 168 hours  Coagulation Studies: No results found for this basename: LABPROT, INR,  in the last 72 hours  Other results: Results for orders placed during the hospital encounter of 01/10/13 (from the past 24 hour(s))  MRSA PCR SCREENING     Status: None   Collection Time    01/10/13  9:55 AM      Result Value Range   MRSA by PCR NEGATIVE  NEGATIVE  COMPREHENSIVE METABOLIC PANEL     Status: Abnormal   Collection Time    01/10/13 10:48 AM      Result Value Range   Sodium 139  135 - 145 mEq/L   Potassium 4.3  3.5 - 5.1 mEq/L   Chloride 106  96 - 112 mEq/L   CO2 26  19 - 32 mEq/L   Glucose, Bld 109 (*)  70 - 99 mg/dL   BUN 16  6 - 23 mg/dL   Creatinine, Ser 1.61  0.50 - 1.35 mg/dL   Calcium 9.7  8.4 - 09.6 mg/dL   Total Protein 6.7  6.0 - 8.3 g/dL   Albumin 3.3 (*) 3.5 - 5.2 g/dL   AST 20  0 - 37 U/L   ALT 9  0 - 53 U/L   Alkaline Phosphatase 45  39 - 117 U/L   Total Bilirubin 0.6  0.3 - 1.2 mg/dL   GFR calc non Af Amer 81 (*) >90 mL/min   GFR calc Af Amer >90  >90 mL/min  PRO B NATRIURETIC PEPTIDE     Status: Abnormal   Collection Time    01/10/13 10:48 AM      Result Value Range   Pro B Natriuretic peptide (BNP) 763.5 (*) 0 - 125 pg/mL  TYPE AND SCREEN     Status: None   Collection Time    01/10/13 10:55 AM      Result Value Range   ABO/RH(D) O POS     Antibody Screen NEG     Sample Expiration 01/13/2013      Imaging: No results found.      Assessment:   1) Acute on chronic HF - class IV 2) Chronic AF on coumadin 3) ICM, EF 20% 4) CAD      S/p CABG 2003 5) COPD with depressed DLCO       CPAP at night 6) HLD 7) Hypothyroidism   Plan/Discussion:    He has class IV HF now pending LVAD placement on Tuesday. PA pressures and PVR  mildly elevated on recent RHC. Being admitted for PA catheter placement and milrinone optimization.   Given depressed DLCO will order VQ scan to r/o PE and will also obtain venous dopplers to r/o DVT.   Pharmacy to dose heparin post PA cath insertion (no bolus)   Length of Stay:   Arvilla Meres 01/10/2013, 12:21 PM

## 2013-01-10 NOTE — CV Procedure (Signed)
[name] Insertion Procedure Note:  Indication:  HF pre-VAD optimization  Procedures performed:  1) [name] insertion   Description of procedure:   The risks and indication of the procedure were explained. Consent was signed and placed on the chart. The patient was prepped and draped in routine sterile fashion.  An appropriate timeout was taken prior to the procedure. The right IJ was prepped and draped in the routine sterile fashion and anesthetized with 1% local lidocaine.   A 7.5 FR venous sheath was placed in the right IJ using a modified Seldinger technique. A standard Swan-Ganz catheter was place under fluoroscopic guidance.  Complications: None apparent.   Shital Crayton 3:14 PM

## 2013-01-10 NOTE — Progress Notes (Signed)
ANTICOAGULATION CONSULT NOTE - Initial Consult  Pharmacy Consult for Heparin Indication: afib, pre-VAD  No Known Allergies  Patient Measurements:   Heparin Dosing Weight: 100 kg  Vital Signs:    Labs: No results found for this basename: HGB, HCT, PLT, APTT, LABPROT, INR, HEPARINUNFRC, CREATININE, CKTOTAL, CKMB, TROPONINI,  in the last 72 hours  The CrCl is unknown because both a height and weight (above a minimum accepted value) are required for this calculation.   Medical History: Past Medical History  Diagnosis Date  . Ischemic cardiomyopathy      CABG 2003, PCI 2007  EF 27%(myoview 2012  . CHF (congestive heart failure)     class I-II  . Sleep apnea     ?  [name] Intestine disorder     blockage  . ICD (implantable cardiac defibrillator) in place   . Hyperlipidemia   . Hypothyroidism   . Chronic anticoagulation   . Obesity   . COPD (chronic obstructive pulmonary disease)   . Asbestosis   . Atrial fibrillation     paroxysmal; on coumadin  . Paroxysmal ventricular tachycardia     has ICD in place  . Coronary artery disease     Medications:  Scheduled:  . atorvastatin  40 mg Oral q1800  . budesonide-formoterol  2 puff Inhalation BID  . docusate sodium  200 mg Oral QHS  . levothyroxine  50 mcg Oral QHS  . lisinopril  20 mg Oral QHS  . sodium chloride  3 mL Intravenous Q12H  . tiotropium  18 mcg Inhalation Daily  . [DISCONTINUED] enoxaparin (LOVENOX) injection  30 mg Subcutaneous Q12H    Assessment: 72 yo male admitted for pre-VAD tune up with milrinone.  Planning to go for LVAD placement on Tues 4/15.  INR = 1.3 on 4/10, and no Coumadin taken since Sunday 4/6.  Pharmacy asked to begin IV heparin bridge 2 hrs after Swan placement today.  Goal of Therapy:  Heparin level 0.3-0.7 units/ml Monitor platelets by anticoagulation protocol: Yes   Plan:  1. Start IV heparin gtt at 1400 units/hr at 1600 after swan placed.  No bolus. 2. Check heparin level 6 hrs after  gtt starts. 3. Daily heparin level and CBC.  Tad Moore, BCPS  Clinical Pharmacist Pager [phone]  01/10/2013 10:42 AM

## 2013-01-10 NOTE — Progress Notes (Signed)
Dr Katha Cabal called re: patient's low BP.  Orders received to reduce Milrinone drip.  CO/CI=6.4/2.9.

## 2013-01-10 NOTE — Progress Notes (Signed)
  Most recent hemodynamics reviewed. PA pressures much improved with milrinone. Will confirm personally in am.   He is close to being optimized for VAD.   Ryan Boom [name],MD 10:47 PM

## 2013-01-10 NOTE — Progress Notes (Signed)
ANTICOAGULATION CONSULT NOTE - Follow Up Consult  Pharmacy Consult for heparin Indication: atrial fibrillation and awaiting LVAD  Labs:  Recent Labs  01/10/13 1048 01/10/13 1420 01/10/13 2230  HGB  --  9.1*  --   HCT  --  30.3*  --   PLT  --  145*  --   HEPARINUNFRC  --   --  0.19*  CREATININE 0.97  --   --     Assessment: 72yo male subtherapeutic on heparin with initial dosing while Coumadin on hold awaiting LVAD placement.  Goal of Therapy:  Heparin level 0.3-0.7 units/ml   Plan:  Will increase heparin gtt by 3 units/kg/hr to 1700 units/hr and check level with am labs.  Vernard Gambles, PharmD, BCPS  01/10/2013,11:16 PM

## 2013-01-11 ENCOUNTER — Encounter (HOSPITAL_COMMUNITY): Payer: Self-pay | Admitting: Anesthesiology

## 2013-01-11 DIAGNOSIS — J449 Chronic obstructive pulmonary disease, unspecified: Secondary | ICD-10-CM

## 2013-01-11 DIAGNOSIS — I5023 Acute on chronic systolic (congestive) heart failure: Secondary | ICD-10-CM

## 2013-01-11 DIAGNOSIS — I272 Pulmonary hypertension, unspecified: Secondary | ICD-10-CM

## 2013-01-11 DIAGNOSIS — M79609 Pain in unspecified limb: Secondary | ICD-10-CM

## 2013-01-11 DIAGNOSIS — R942 Abnormal results of pulmonary function studies: Secondary | ICD-10-CM

## 2013-01-11 DIAGNOSIS — I2789 Other specified pulmonary heart diseases: Secondary | ICD-10-CM

## 2013-01-11 LAB — HEPARIN LEVEL (UNFRACTIONATED)
Heparin Unfractionated: 0.27 IU/mL — ABNORMAL LOW (ref 0.30–0.70)
Heparin Unfractionated: 0.29 IU/mL — ABNORMAL LOW (ref 0.30–0.70)

## 2013-01-11 LAB — CBC
HCT: 28.9 % — ABNORMAL LOW (ref 39.0–52.0)
Hemoglobin: 8.8 g/dL — ABNORMAL LOW (ref 13.0–17.0)
MCH: 24.9 pg — ABNORMAL LOW (ref 26.0–34.0)
MCHC: 30.4 g/dL (ref 30.0–36.0)
MCV: 81.9 fL (ref 78.0–100.0)
Platelets: 130 10*3/uL — ABNORMAL LOW (ref 150–400)
RBC: 3.53 MIL/uL — ABNORMAL LOW (ref 4.22–5.81)
RDW: 16.8 % — ABNORMAL HIGH (ref 11.5–15.5)
WBC: 7 10*3/uL (ref 4.0–10.5)

## 2013-01-11 LAB — ANTITHROMBIN III: AntiThromb III Func: 64 % — ABNORMAL LOW (ref 75–120)

## 2013-01-11 LAB — BASIC METABOLIC PANEL
BUN: 13 mg/dL (ref 6–23)
CO2: 27 mEq/L (ref 19–32)
Glucose, Bld: 133 mg/dL — ABNORMAL HIGH (ref 70–99)
Potassium: 4.1 mEq/L (ref 3.5–5.1)
Sodium: 136 mEq/L (ref 135–145)

## 2013-01-11 LAB — CARBOXYHEMOGLOBIN
Carboxyhemoglobin: 1.9 % — ABNORMAL HIGH (ref 0.5–1.5)
Methemoglobin: 0.7 % (ref 0.0–1.5)
Total hemoglobin: 8.8 g/dL — ABNORMAL LOW (ref 13.5–18.0)

## 2013-01-11 LAB — SURGICAL PCR SCREEN
MRSA, PCR: NEGATIVE
Staphylococcus aureus: NEGATIVE

## 2013-01-11 MED ORDER — MILRINONE IN DEXTROSE 20 MG/100ML IV SOLN
0.3000 ug/kg/min | INTRAVENOUS | Status: DC
  Filled 2013-01-11: qty 100

## 2013-01-11 MED ORDER — POTASSIUM CHLORIDE 2 MEQ/ML IV SOLN
80.0000 meq | INTRAVENOUS | Status: DC
  Filled 2013-01-11: qty 40

## 2013-01-11 MED ORDER — PHENYLEPHRINE HCL 10 MG/ML IJ SOLN
0.0000 ug/min | INTRAVENOUS | Status: AC
  Administered 2013-01-12: 10 ug/min via INTRAVENOUS
  Filled 2013-01-11: qty 2

## 2013-01-11 MED ORDER — DOBUTAMINE IN D5W 4-5 MG/ML-% IV SOLN
2.0000 ug/kg/min | INTRAVENOUS | Status: DC
  Filled 2013-01-11: qty 250

## 2013-01-11 MED ORDER — NITROGLYCERIN IN D5W 200-5 MCG/ML-% IV SOLN
0.0000 ug/min | INTRAVENOUS | Status: DC
  Filled 2013-01-11: qty 250

## 2013-01-11 MED ORDER — CHLORHEXIDINE GLUCONATE CLOTH 2 % EX PADS
6.0000 | MEDICATED_PAD | Freq: Once | CUTANEOUS | Status: AC
  Administered 2013-01-12: 6 via TOPICAL

## 2013-01-11 MED ORDER — SODIUM CHLORIDE 0.9 % IV SOLN
INTRAVENOUS | Status: AC
  Administered 2013-01-12: 70 mL/h via INTRAVENOUS
  Filled 2013-01-11: qty 40

## 2013-01-11 MED ORDER — VANCOMYCIN HCL 10 G IV SOLR
1250.0000 mg | INTRAVENOUS | Status: DC
  Filled 2013-01-11: qty 1250

## 2013-01-11 MED ORDER — ALPRAZOLAM 0.25 MG PO TABS
0.2500 mg | ORAL_TABLET | ORAL | Status: DC | PRN
  Administered 2013-01-11: 0.5 mg via ORAL
  Administered 2013-01-12: 0.25 mg via ORAL
  Filled 2013-01-11: qty 1
  Filled 2013-01-11: qty 2

## 2013-01-11 MED ORDER — MUPIROCIN 2 % EX OINT
1.0000 "application " | TOPICAL_OINTMENT | Freq: Two times a day (BID) | CUTANEOUS | Status: DC
  Administered 2013-01-11: 1 via NASAL
  Filled 2013-01-11: qty 22

## 2013-01-11 MED ORDER — DEXTROSE 5 % IV SOLN
0.0000 ug/min | INTRAVENOUS | Status: AC
  Administered 2013-01-12: 4 ug via INTRAVENOUS
  Filled 2013-01-11: qty 8

## 2013-01-11 MED ORDER — TEMAZEPAM 15 MG PO CAPS
15.0000 mg | ORAL_CAPSULE | Freq: Once | ORAL | Status: AC | PRN
  Administered 2013-01-11: 15 mg via ORAL
  Filled 2013-01-11: qty 1

## 2013-01-11 MED ORDER — BOOST / RESOURCE BREEZE PO LIQD
1.0000 | Freq: Two times a day (BID) | ORAL | Status: DC
  Administered 2013-01-11: 1 via ORAL

## 2013-01-11 MED ORDER — RIFAMPIN 300 MG PO CAPS
600.0000 mg | ORAL_CAPSULE | Freq: Once | ORAL | Status: AC
  Administered 2013-01-12: 600 mg via ORAL
  Filled 2013-01-11: qty 2

## 2013-01-11 MED ORDER — HEPARIN (PORCINE) IN NACL 100-0.45 UNIT/ML-% IJ SOLN: 1850.0000 [IU]/h | INTRAMUSCULAR | Status: DC

## 2013-01-11 MED ORDER — MAGNESIUM SULFATE 50 % IJ SOLN
40.0000 meq | INTRAMUSCULAR | Status: DC
  Filled 2013-01-11: qty 10

## 2013-01-11 MED ORDER — CHLORHEXIDINE GLUCONATE 4 % EX LIQD
60.0000 mL | Freq: Once | CUTANEOUS | Status: AC
  Administered 2013-01-11: 4 via TOPICAL
  Filled 2013-01-11: qty 60

## 2013-01-11 MED ORDER — DEXMEDETOMIDINE HCL IN NACL 400 MCG/100ML IV SOLN
0.1000 ug/kg/h | INTRAVENOUS | Status: AC
  Administered 2013-01-12: 0.3 ug/kg/h via INTRAVENOUS
  Filled 2013-01-11: qty 100

## 2013-01-11 MED ORDER — VANCOMYCIN HCL 10 G IV SOLR
1500.0000 mg | INTRAVENOUS | Status: AC
  Administered 2013-01-12: 1500 mg via INTRAVENOUS
  Filled 2013-01-11: qty 1500

## 2013-01-11 MED ORDER — CHLORHEXIDINE GLUCONATE CLOTH 2 % EX PADS
6.0000 | MEDICATED_PAD | Freq: Once | CUTANEOUS | Status: AC
  Administered 2013-01-11: 6 via TOPICAL

## 2013-01-11 MED ORDER — EPINEPHRINE HCL 1 MG/ML IJ SOLN
0.0000 ug/min | INTRAVENOUS | Status: DC
  Filled 2013-01-11: qty 4

## 2013-01-11 MED ORDER — CHLORHEXIDINE GLUCONATE 4 % EX LIQD
60.0000 mL | Freq: Once | CUTANEOUS | Status: AC
  Administered 2013-01-12: 4 via TOPICAL
  Filled 2013-01-11: qty 60

## 2013-01-11 MED ORDER — DEXTROSE 5 % IV SOLN
1.5000 g | INTRAVENOUS | Status: AC
  Administered 2013-01-12: 1.5 g via INTRAVENOUS
  Administered 2013-01-12: .75 g via INTRAVENOUS
  Filled 2013-01-11: qty 1.5

## 2013-01-11 MED ORDER — FLUCONAZOLE IN SODIUM CHLORIDE 400-0.9 MG/200ML-% IV SOLN
400.0000 mg | INTRAVENOUS | Status: AC
  Administered 2013-01-12: 400 mg via INTRAVENOUS
  Filled 2013-01-11: qty 200

## 2013-01-11 MED ORDER — BISACODYL 5 MG PO TBEC
5.0000 mg | DELAYED_RELEASE_TABLET | Freq: Once | ORAL | Status: AC
  Administered 2013-01-11: 5 mg via ORAL
  Filled 2013-01-11: qty 1

## 2013-01-11 MED ORDER — DEXTROSE 5 % IV SOLN
750.0000 mg | INTRAVENOUS | Status: DC
  Filled 2013-01-11: qty 750

## 2013-01-11 MED ORDER — VANCOMYCIN HCL 1000 MG IV SOLR
1000.0000 mg | INTRAVENOUS | Status: AC
  Administered 2013-01-12: 1000 mg
  Filled 2013-01-11: qty 1000

## 2013-01-11 MED ORDER — DIAZEPAM 5 MG PO TABS
5.0000 mg | ORAL_TABLET | Freq: Once | ORAL | Status: AC
  Administered 2013-01-12: 5 mg via ORAL
  Filled 2013-01-11: qty 1

## 2013-01-11 MED ORDER — VASOPRESSIN 20 UNIT/ML IJ SOLN
0.0400 [IU]/min | INTRAVENOUS | Status: DC
  Filled 2013-01-11: qty 2.5

## 2013-01-11 MED ORDER — DOPAMINE-DEXTROSE 3.2-5 MG/ML-% IV SOLN
0.0000 ug/kg/min | INTRAVENOUS | Status: AC
  Administered 2013-01-12: 3 ug/kg/min via INTRAVENOUS
  Filled 2013-01-11: qty 250

## 2013-01-11 MED ORDER — FUROSEMIDE 10 MG/ML IJ SOLN
40.0000 mg | Freq: Once | INTRAMUSCULAR | Status: AC
  Administered 2013-01-11: 40 mg via INTRAVENOUS
  Filled 2013-01-11: qty 4

## 2013-01-11 MED ORDER — SODIUM CHLORIDE 0.9 % IV SOLN
INTRAVENOUS | Status: AC
  Administered 2013-01-12: 1 [IU] via INTRAVENOUS
  Filled 2013-01-11: qty 1

## 2013-01-11 NOTE — Progress Notes (Signed)
*   Surgery Date in Future * Procedure(s) (LRB): INSERTION OF IMPLANTABLE LEFT VENTRICULAR ASSIST DEVICE (N/A) INTRAOPERATIVE TRANSESOPHAGEAL ECHOCARDIOGRAM (N/A) Subjective: [name] with ischemic cardiomyopathy now symptomatic with any activity at all, class IIIB to class IV required admission for inotropic support.  Status post CABG x3 2003 with patent grafts, left IMA to LAD, saphenous vein graft to OM, saphenous vein graft to PD.  Ejection fraction 20% with good RV function no significant AI or TR, moderate MR present.  Atrial fibrillation on home Coumadin for the past several years without GI bleeding problems. Currently off Coumadin on IV heparin.  The patient needs advanced therapies to improve survival and exercise tolerance. The patient clearly states he is not interested in cardiac transplantation but is very acceptable for destination mechanical cardiac support.  I've discussed the or seizure of LVAD implantation with the patient and his family including the indications, expected benefits, the expected hospital recovery, and the potential risks of stroke, bleeding, MI, and death.  The patient understands to do to his significant COPD and sleep apnea that he is at increased risk for postoperative pulmonary complications. We have had the patient evaluated by the critical care-pulmonary service and his pulmonary status has been optimized. He has not smoked for several years.  Plan implantation of the HeartMate 2 LVAD tomorrow. Objective: Vital signs in last 24 hours: Temp:  [97.9 F (36.6 C)-98.8 F (37.1 C)] 98.8 F (37.1 C) (04/14 1800) Pulse Rate:  [60-108] 84 (04/14 1800) Cardiac Rhythm:  [-] Atrial fibrillation (04/14 1800) Resp:  [17-29] 24 (04/14 1800) BP: (84-128)/(28-77) 114/55 mmHg (04/14 1800) SpO2:  [92 %-100 %] 92 % (04/14 1800) Weight:  [228 lb 9.9 oz (103.7 kg)] 228 lb 9.9 oz (103.7 kg) (04/14 0500)  Hemodynamic parameters for last 24  hours: PAP: (29-54)/(13-29) 38/17 mmHg CVP:  [1 mmHg-11 mmHg] 6 mmHg PCWP:  [16 mmHg-22 mmHg] 18 mmHg CO:  [4.2 L/min-6.7 L/min] 5.5 L/min CI:  [1.9 L/min/m2-3 L/min/m2] 2.4 L/min/m2  Intake/Output from previous day: 04/13 0701 - 04/14 0700 In: 778.5 [P.O.:360; I.V.:418.5] Out: 975 [Urine:975] Intake/Output this shift: Total I/O In: 972.4 [P.O.:590; I.V.:382.4] Out: 2235 [Urine:2235]  Exam Alert and comfortable Breath sounds clear Extremities warm Atrial fibrillation CVP is low by Swan-Ganz catheter  Lab Results:  Recent Labs  01/10/13 1420 01/11/13 0340  WBC 6.8 7.0  HGB 9.1* 8.8*  HCT 30.3* 28.9*  PLT 145* 130*   BMET:  Recent Labs  01/10/13 1048 01/11/13 0340  NA 139 136  K 4.3 4.1  CL 106 103  CO2 26 27  GLUCOSE 109* 133*  BUN 16 13  CREATININE 0.97 0.84  CALCIUM 9.7 9.3    PT/INR: No results found for this basename: LABPROT, INR,  in the last 72 hours ABG    Component Value Date/Time   PHART 7.396 01/07/2013 1311   HCO3 22.0 01/07/2013 1317   TCO2 23 01/07/2013 1317   ACIDBASEDEF 4.0* 01/07/2013 1317   O2SAT 58.2 01/11/2013 0335   CBG (last 3)  No results found for this basename: GLUCAP,  in the last 72 hours  Assessment/Plan: S/P Procedure(s) (LRB): INSERTION OF IMPLANTABLE LEFT VENTRICULAR ASSIST DEVICE (N/A) INTRAOPERATIVE TRANSESOPHAGEAL ECHOCARDIOGRAM (N/A) Implantation of LVAD in a.m.   LOS: 1 day    VAN TRIGT Wood,Ryan 01/11/2013

## 2013-01-11 NOTE — Progress Notes (Signed)
INITIAL NUTRITION ASSESSMENT  DOCUMENTATION CODES Per approved criteria  -Obesity Unspecified   INTERVENTION: Resource Breeze po BID, each supplement provides 250 kcal and 9 grams of protein.  NUTRITION DIAGNOSIS: Increased nutrient needs related to upcoming surgery as evidenced by estimated needs.   Goal: Pt to meet >/= 90% of their estimated nutrition needs.   Monitor:  PO intake, labs, supplement acceptance, labs  Reason for Assessment: MD Consult  72 y.o. male  Admitting Dx: LVAD Placment  ASSESSMENT: Pt admitted with functional decline, for planned LVAD placement. Pt and wife provide hx. They report that he has lost > 20 lb in the last year due to healthy lifestyle changes. Pt has been seen by RD at Cardiac rehab for diet education.  Per pt he has a good appetite but was unable to eat much Breakfast due to bloating once he started to eat, question hypoperfusion. Pt reports being lactose intolerant and that ensure gives him diarrhea.   Height: Ht Readings from Last 1 Encounters:  01/10/13 6' (1.829 m)    Weight: Wt Readings from Last 1 Encounters:  01/11/13 228 lb 9.9 oz (103.7 kg)    Ideal Body Weight: 80.9 kg  % Ideal Body Weight: 128%  Wt Readings from Last 10 Encounters:  01/11/13 228 lb 9.9 oz (103.7 kg)  01/07/13 227 lb (102.967 kg)  01/07/13 227 lb (102.967 kg)  12/30/12 235 lb (106.595 kg)  12/29/12 235 lb (106.595 kg)  12/17/12 235 lb 8 oz (106.822 kg)  08/26/12 233 lb 6.4 oz (105.87 kg)  07/29/12 241 lb 12.8 oz (109.68 kg)  07/02/12 237 lb (107.502 kg)  12/16/11 249 lb 1.9 oz (113 kg)    Usual Body Weight: 249 lb  % Usual Body Weight: 92%  BMI:  Body mass index is 31 kg/(m^2).Obesity Class I  Estimated Nutritional Needs: Kcal: 2200-2300 Protein: 110-120 grams Fluid: >1.5 L/day  Skin: no issues noted  Diet Order: Cardiac Meal Completion: 50-100%  EDUCATION NEEDS: -No education needs identified at this time   Intake/Output Summary  (Last 24 hours) at 01/11/13 0949 Last data filed at 01/11/13 0944  Gross per 24 hour  Intake 929.32 ml  Output   2185 ml  Net -1255.68 ml    Last BM: 4/13   Labs:   Recent Labs Lab 01/07/13 1016 01/10/13 1048 01/11/13 0340  NA 137 139 136  K 4.6 4.3 4.1  CL 105 106 103  CO2 25 26 27   BUN 18 16 13   CREATININE 0.98 0.97 0.84  CALCIUM 10.0 9.7 9.3  GLUCOSE 112* 109* 133*    CBG (last 3)  No results found for this basename: GLUCAP,  in the last 72 hours  Scheduled Meds: . atorvastatin  40 mg Oral q1800  . budesonide-formoterol  2 puff Inhalation BID  . docusate sodium  200 mg Oral QHS  . levothyroxine  50 mcg Oral QHS  . lisinopril  20 mg Oral QHS  . sodium chloride  3 mL Intravenous Q12H  . tiotropium  18 mcg Inhalation Daily    Continuous Infusions: . heparin 1,850 Units/hr (01/11/13 0946)  . milrinone 0.25 mcg/kg/min (01/11/13 1610)    Past Medical History  Diagnosis Date  . Ischemic cardiomyopathy      CABG 2003, PCI 2007  EF 27%(myoview 2012  . CHF (congestive heart failure)     class I-II  . Sleep apnea     ?  [name] Intestine disorder     blockage  . ICD (implantable  cardiac defibrillator) in place   . Hyperlipidemia   . Hypothyroidism   . Chronic anticoagulation   . Obesity   . COPD (chronic obstructive pulmonary disease)   . Asbestosis   . Atrial fibrillation     paroxysmal; on coumadin  . Paroxysmal ventricular tachycardia     has ICD in place  . Coronary artery disease     Past Surgical History  Procedure Laterality Date  . Coronary artery bypass graft    . Cardiac defibrillator placement      Kendell Bane RD, LDN, CNSC [phone] Pager [phone] After Hours Pager

## 2013-01-11 NOTE — Progress Notes (Signed)
Patient does not want to wear cpap at this time. 

## 2013-01-11 NOTE — Consult Note (Signed)
PULMONARY  / CRITICAL CARE MEDICINE  Name: [name] MRN: [id number] DOB: Jan 28, 1941    ADMISSION DATE:  01/10/2013 CONSULTATION DATE:  01/11/13  REFERRING MD :  Dr. Gala Romney PRIMARY SERVICE:  CHF   CHIEF COMPLAINT:  Reduced activity tolerence, reduced DLCO   BRIEF PATIENT DESCRIPTION: 72 y/o M, former smoker, with PMH of COPD, Asbestosis, Obesity, OSA on cpap,  ICM s/p AICD, CHF, & afib on coumadin admitted on 4/13 after progressive functional decline for placement of LVAD.  PCCM asked to evaluate for reduced DLCO / pulmonary optimization prior to VAD placement.   SIGNIFICANT EVENTS / STUDIES:  4/08 - ECHO >>EF 25-30%, mod to severe LV dilation, mod MR, PA Peak of 78 4/10 - severe native CAD, patent grafts, Severe ICM (EF 20%), PA 61/21 ............................................................................................................................... 4/13 - Admit with functional decline, planned LVAD placement.  VQ scan >>>low probability PE, ventilatory changes c/w COPD   4/14 - LE Doppler>>>neg prelim for DVT  LINES / TUBES: 4/13 Swan>>>  CULTURES: 4/13 MRSA PCR>>>negative  ANTIBIOTICS:   HISTORY OF PRESENT ILLNESS:  72 y/o M, former smoker (61 pk yr hx), with PMH of COPD, Asbestosis, Obesity, OSA on cpap / nocturnal O2 (3L only at night),  ICM s/p AICD, CHF & afib on coumadin.  At baseline, patient has limited activity tolerance.  In Oct 2013, he could ambulate approx 60 ft with periods of rest on bad days, on a good day he could walk the store with his wife.  He had progressive decline in activity tolerance to the point of only tolerating 28ft of activity with rests.  He has not been able to tolerate showers as of last 2-3 weeks due to SOB and had to create a "shower chair" at home.  He has  pulmonary nodules that were discovered in 2003 prior to CABG.  They have been followed at the [place] since with no change per pt / wife.  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  He has pulmonary asbestosis with plaques.  Prior to admit he has undergone RHC which demonstrated severe native CAD, patent grafts, Severe ICM (EF 20%), PA 61/21 and ECHO which noted EF 25-30%, mod to severe LV dilation, mod MR, PA Peak of 78.  He was admitted on 4/13 for potential placement of LVAD.     PAST MEDICAL HISTORY :  Past Medical History  Diagnosis Date  . Ischemic cardiomyopathy      CABG 2003, PCI 2007  EF 27%(myoview 2012  . CHF (congestive heart failure)     class I-II  . Sleep apnea     ?  [name] Intestine disorder     blockage  . ICD (implantable cardiac defibrillator) in place   . Hyperlipidemia   . Hypothyroidism   . Chronic anticoagulation   . Obesity   . COPD (chronic obstructive pulmonary disease)   . Asbestosis   . Atrial fibrillation     paroxysmal; on coumadin  . Paroxysmal ventricular tachycardia     has ICD in place  . Coronary artery disease    Past Surgical History  Procedure Laterality Date  . Coronary artery bypass graft    . Cardiac defibrillator placement     Prior to Admission medications   Medication Sig Start Date End Date Taking? Authorizing Provider  albuterol (PROVENTIL) (2.5 MG/3ML) 0.083% nebulizer solution Take 2.5 mg by nebulization every 6 (six) hours as needed.  Yes Historical Provider, MD  budesonide-formoterol (SYMBICORT) 160-4.5 MCG/ACT inhaler Inhale 2 puffs into the lungs 2 (two) times daily.   Yes Historical Provider, MD  carvedilol (COREG) 12.5 MG tablet Take 12.5 mg by mouth 2 (two) times daily with a meal.   Yes Historical Provider, MD  docusate sodium (COLACE) 100 MG capsule Take 200 mg by mouth at bedtime.    Yes Historical Provider, MD  levothyroxine (SYNTHROID, LEVOTHROID) 100 MCG tablet Take 50 mcg by mouth at bedtime.    Yes Historical Provider, MD  lisinopril (PRINIVIL,ZESTRIL) 40 MG tablet Take 20 mg by mouth at bedtime.    Yes Historical Provider, MD  nitroGLYCERIN (NITROSTAT) 0.4  MG SL tablet Place 0.4 mg under the tongue every 5 (five) minutes as needed.     Yes Historical Provider, MD  ranitidine (ZANTAC) 150 MG tablet Take 150 mg by mouth 2 (two) times daily.   Yes Historical Provider, MD  simvastatin (ZOCOR) 80 MG tablet Take 40 mg by mouth at bedtime.     Yes Historical Provider, MD  tiotropium (SPIRIVA) 18 MCG inhalation capsule Place 18 mcg into inhaler and inhale daily.    Yes Historical Provider, MD  warfarin (COUMADIN) 5 MG tablet Take 5 mg by mouth daily. Takes 5mg  every day, except thurs-takes 2.5mg .   Yes Historical Provider, MD   No Known Allergies  FAMILY HISTORY:  Family History  Problem Relation Age of Onset  . Heart attack Mother   . Heart attack Father    SOCIAL HISTORY:  reports that he quit smoking about 11 years ago. His smoking use included Cigarettes. He has a 90 pack-year smoking history. He does not have any smokeless tobacco history on file. He reports that he does not drink alcohol or use illicit drugs.  REVIEW OF SYSTEMS:   Constitutional: Negative for fever, chills, weight loss, malaise/fatigue and diaphoresis.  HENT: Negative for hearing loss, ear pain, nosebleeds, congestion, sore throat, neck pain, tinnitus and ear discharge.   Eyes: Negative for blurred vision, double vision, photophobia, pain, discharge and redness.  Respiratory: Negative for cough, hemoptysis, sputum production, shortness of breath, wheezing and stridor.   Cardiovascular: Negative for chest pain, palpitations, orthopnea, claudication, leg swelling and PND.  Gastrointestinal: Negative for heartburn, nausea, vomiting, abdominal pain, diarrhea, constipation, blood in stool and melena.  Genitourinary: Negative for dysuria, urgency, frequency, hematuria and flank pain.  Musculoskeletal: Negative for myalgias, back pain, joint pain and falls.  Skin: Negative for itching and rash.  Neurological: Negative for dizziness, tingling, tremors, sensory change, speech change,  focal weakness, seizures, loss of consciousness, weakness and headaches.  Endo/Heme/Allergies: Negative for environmental allergies and polydipsia. Does not bruise/bleed easily.  SUBJECTIVE:  Pt reports no SOB at rest, DOE.    VITAL SIGNS: Temp:  [97.5 F (36.4 C)-98.6 F (37 C)] 98.4 F (36.9 C) (04/14 1000) Pulse Rate:  [60-108] 80 (04/14 1000) Resp:  [16-29] 19 (04/14 1000) BP: (84-126)/(28-66) 106/51 mmHg (04/14 1000) SpO2:  [93 %-100 %] 97 % (04/14 1000) Weight:  [228 lb 9.9 oz (103.7 kg)] 228 lb 9.9 oz (103.7 kg) (04/14 0500)  PHYSICAL EXAMINATION: General:  wdwn adult male in NAD Neuro:  AAOx4, speech clear, MAE HEENT:  Mm pink/moist, pupils =R Cardiovascular:  s1s2 rrr, no m/r/g Lungs:  resp's even/non-labored at rest, 3L /[place], lungs bilaterally diminished, few scattered faint wheezes Abdomen:  Obese/soft, bsx4 active, tol PO's Musculoskeletal:  No acute deformities Skin:  Warm/dry, trace LE edema   Recent Labs Lab  01/07/13 1016 01/10/13 1048 01/11/13 0340  NA 137 139 136  K 4.6 4.3 4.1  CL 105 106 103  CO2 25 26 27   BUN 18 16 13   CREATININE 0.98 0.97 0.84  GLUCOSE 112* 109* 133*    Recent Labs Lab 01/05/13 0811 01/10/13 1420 01/11/13 0340  HGB 10.0* 9.1* 8.8*  HCT 32.4* 30.3* 28.9*  WBC 7.1 6.8 7.0  PLT 131* 145* 130*   Dg Chest 2 View  01/10/2013  *RADIOLOGY REPORT*  Clinical Data: History of acute-on-chronic systolic congestive heart failure, being evaluated for possible LV assist device.  CHEST - 2 VIEW  Comparison: CT chest 01/05/2013.  Two-view chest x-ray 11/04/2011.  Findings: Prior sternotomy CABG.  Cardiac silhouette moderately enlarged but stable.  Thoracic aorta mildly atherosclerotic, unchanged.  Hilar and mediastinal contours otherwise unremarkable. Left subclavian pacing defibrillator unchanged appears intact. Calcified pleural plaques along both hemidiaphragms. Pleuroparenchymal scarring at the lung bases.  Emphysematous changes in the upper  lobes.  Prominent bronchovascular markings diffusely and central peribronchial thickening, unchanged, consistent with chronic bronchitis.  No new pulmonary parenchymal abnormalities.  Visualized bony thorax intact.  IMPRESSION: Stable cardiomegaly without pulmonary edema.  COPD/emphysema. Asbestos related pleural disease.  Bibasilar pleuroparenchymal scarring.  No acute cardiopulmonary disease.   Original Report Authenticated By: Hulan Saas, M.D.    Nm Pulmonary Perf And Vent  01/10/2013  *RADIOLOGY REPORT*  Clinical Data:  Prior history of ischemic cardiomyopathy and acute- on-chronic systolic congestive heart failure, history of COPD/emphysema and asbestos related pleural disease, being evaluated for possible left ventricular assist device placement.  NUCLEAR MEDICINE VENTILATION - PERFUSION LUNG SCAN  Technique:  Ventilation images were obtained in multiple projections using inhaled aerosol technetium 99 M DTPA.  Perfusion images were obtained in multiple projections after intravenous injection of Tc-17m MAA.  Radiopharmaceuticals:  39. Tc-93m DTPA aerosol and 5.7 mCi Tc- 19m MAA.  Comparison: No prior nuclear imaging.  CT chest 01/05/2013.  Two- view chest x-ray subsequently same date.  Findings:  Ventilation:  Heterogeneous ventilation, with diminished ventilation in the upper lobes, consistent with emphysema.  Perfusion:  Heterogeneous perfusion corresponding to the ventilatory abnormalities.  No segmental or subsegmental perfusion defect to suggest pulmonary embolism.  IMPRESSION: Low probability of pulmonary embolism.  Heterogeneous perfusion corresponds to the ventilatory abnormalities related to COPD/emphysema.   Original Report Authenticated By: Hulan Saas, M.D.    Dg Fluoro Guide Cv Line-no Report  01/10/2013  CLINICAL DATA: Theone Murdoch placement   FLOURO GUIDE CV LINE  Fluoroscopy was utilized by the requesting physician.  No radiographic  interpretation.      ASSESSMENT /  PLAN:  COPD - at baseline is on symbicort, spiriva & PRN albuterol.   OSA - home CPAP with good adherence PAH - mild, noted on RHC.   Reduced DLCO - VQ scan with low probability PE & LE Doppler Negative for DVT (prelim).  Reduced DLCO is multi-factorial in setting of acute on chronic CHF, COPD / obstructive, pulmonary asbestosis (mild fibrosis / plaques) / restrictive, and PAH.    Plan: -recommend oxygen 3L per [address] continuous at discharge (for [facility]) -anti-coagulation for fib (and PAH) -auto set CPAP  -continue spiriva, symbicort, albuterol -post surgical procedure, recommend he move to scheduled nebulized BD's -IS / Pulmonary hygiene   Canary Brim, NP-C [facility] Pgr: [phone] or [phone]    01/11/2013, 11:01 AM   PCCM Attending: I have interviewed and examined the patient and reviewed the database. I have formulated the  assessment and plan as reflected in the note above with amendments made by me.   His PFTs show mixed obstruction (likely due to longstanding smoking - quit 2003) and restriction (likely due to pleural calcifications - hx of asbestos exposure in [employer] - and due to moderate obesity). He has moderate pulmonary hypertension by Echo and R heart cath which is likely due to both the cardiac and pulmonary disease processes. The markedly abnormal DlCO can be attributed to all of the above.  He should be on chronic anti-coagulation (which he already is for AF) and I have recommended to him that he wear O2 as close to 24 hrs per day as possible. This will perhaps help with the pulmonary hypertension and will likely help with exercise tolerance.  I agree with plans for LVAD and have offered to him my support for this recommendation when he expressed some reservations about proceeding.   Billy Fischer, MD;  PCCM service; Mobile [phone]

## 2013-01-11 NOTE — Progress Notes (Signed)
Advanced Heart Failure Rounding Note   Subjective:    [name] is a 72 year old gentleman with a history of CABG (2003), Chronic systolic HF EF 32%, S/P AICD (generator change 2011), hyperlipidemia, hypothyroidism, emphysema, OSA, intermittent atrial fibrillation (on coumadin). Quit smoking 2003. Uses CPAP and O2 every night. He is a disabled [employer].  He was referred to the AHF clinic by Dr. Elease Hashimoto and over the past couple of months his functional capacity has really declined. He has class IV heart failure symptoms and has not improved on OMM. His CPX in 11/13 showed a VO2 of 9.2%, VE/VCO2- 52.8, OUES 1.15 and RER= 1.02. We have been following him closely and have had multiple discussions about his options and the patient decided that he wanted to proceed with LVAD placement.   Cath 01/06/13  RA = 10  RV = 54/6/9  PA = 61/21 (36)  PCW = 17  Fick cardiac output/index = 5.8/2.6  Thermo CO/CI = 4.2/1.9  PVR = 3.3 Woods  FA sat = 98%  PA sat = 59%, 60%  Severe native CAD with all grafts patent. EF 20%  Yesterday he was admitted for pre-optimization of his HF before LVAD implant tomorrow. Swan placed . VQ negative   Complains of dyspnea / nausea this am. + PND + orthopnea. Weight up 5 pounds. Milrinone initiated at 0.25 with nice response PAP in 30s. But had to be cut back to SBP 90s  CO-OX 58 on Milrinone 0.125 mcg  0500 SWAN -checked personally CVP 9 PAP 52/29 PCWP 18  CO 6.7 CI 3 PVR 2.8 Co-ox 58%  Objective:   Weight Range:  Vital Signs:   Temp:  [97.5 F (36.4 C)-98.6 F (37 C)] 97.9 F (36.6 C) (04/14 0600) Pulse Rate:  [60-108] 77 (04/14 0600) Resp:  [16-29] 24 (04/14 0600) BP: (84-116)/(28-66) 94/35 mmHg (04/14 0600) SpO2:  [93 %-100 %] 95 % (04/14 0600) Weight:  [223 lb 15.8 oz (101.6 kg)-228 lb 9.9 oz (103.7 kg)] 228 lb 9.9 oz (103.7 kg) (04/14 0500) Last BM Date: 01/10/13  Weight change: Filed Weights   01/10/13 1000 01/11/13 0500  Weight: 223 lb 15.8 oz  (101.6 kg) 228 lb 9.9 oz (103.7 kg)    Intake/Output:   Intake/Output Summary (Last 24 hours) at 01/11/13 0659 Last data filed at 01/11/13 0600  Gross per 24 hour  Intake 747.72 ml  Output    750 ml  Net  -2.28 ml     Physical Exam:  General: Lying in bed. Mildly anxious. Dyspnea at rest  HEENT: normal  Neck: supple. JVP hard to see. Looks about 9-10 . Carotids 2+ bilat; no bruits. No lymphadenopathy or thryomegaly appreciated. RIJ swan Cor: PMI laterally displaced. Irregular rate & rhythm. No rubs, gallops or murmurs.  Lungs: decreased air movement throughout. No wheeze.  Abdomen: soft, nontender, nondistended. No hepatosplenomegaly. No bruits or masses. Good bowel sounds.  Extremities: no cyanosis, clubbing, rash, edema bilateral SCDs Neuro: alert & orientedx3, cranial nerves grossly intact. moves all 4 extremities w/o difficulty. Affect pleasant   Telemetry: A fib  70s  Labs: Basic Metabolic Panel:  Recent Labs Lab 01/05/13 0811 01/07/13 1016 01/10/13 1048 01/11/13 0340  NA 142 137 139 136  K 4.6 4.6 4.3 4.1  CL 108 105 106 103  CO2 27 25 26 27   GLUCOSE 121* 112* 109* 133*  BUN 20 18 16 13   CREATININE 1.02 0.98 0.97 0.84  CALCIUM 10.0 10.0 9.7 9.3    Liver Function  Tests:  Recent Labs Lab 01/10/13 1048  AST 20  ALT 9  ALKPHOS 45  BILITOT 0.6  PROT 6.7  ALBUMIN 3.3*   No results found for this basename: LIPASE, AMYLASE,  in the last 168 hours No results found for this basename: AMMONIA,  in the last 168 hours  CBC:  Recent Labs Lab 01/05/13 0811 01/10/13 1420 01/11/13 0340  WBC 7.1 6.8 7.0  HGB 10.0* 9.1* 8.8*  HCT 32.4* 30.3* 28.9*  MCV 83.3 83.0 81.9  PLT 131* 145* 130*    Cardiac Enzymes: No results found for this basename: CKTOTAL, CKMB, CKMBINDEX, TROPONINI,  in the last 168 hours  BNP: BNP (last 3 results)  Recent Labs  07/02/12 0837 01/10/13 1048  PROBNP 149.0* 763.5*    Imaging: Dg Chest 2 View  01/10/2013  *RADIOLOGY  REPORT*  Clinical Data: History of acute-on-chronic systolic congestive heart failure, being evaluated for possible LV assist device.  CHEST - 2 VIEW  Comparison: CT chest 01/05/2013.  Two-view chest x-ray 11/04/2011.  Findings: Prior sternotomy CABG.  Cardiac silhouette moderately enlarged but stable.  Thoracic aorta mildly atherosclerotic, unchanged.  Hilar and mediastinal contours otherwise unremarkable. Left subclavian pacing defibrillator unchanged appears intact. Calcified pleural plaques along both hemidiaphragms. Pleuroparenchymal scarring at the lung bases.  Emphysematous changes in the upper lobes.  Prominent bronchovascular markings diffusely and central peribronchial thickening, unchanged, consistent with chronic bronchitis.  No new pulmonary parenchymal abnormalities.  Visualized bony thorax intact.  IMPRESSION: Stable cardiomegaly without pulmonary edema.  COPD/emphysema. Asbestos related pleural disease.  Bibasilar pleuroparenchymal scarring.  No acute cardiopulmonary disease.   Original Report Authenticated By: Hulan Saas, M.D.    Nm Pulmonary Perf And Vent  01/10/2013  *RADIOLOGY REPORT*  Clinical Data:  Prior history of ischemic cardiomyopathy and acute- on-chronic systolic congestive heart failure, history of COPD/emphysema and asbestos related pleural disease, being evaluated for possible left ventricular assist device placement.  NUCLEAR MEDICINE VENTILATION - PERFUSION LUNG SCAN  Technique:  Ventilation images were obtained in multiple projections using inhaled aerosol technetium 99 M DTPA.  Perfusion images were obtained in multiple projections after intravenous injection of Tc-53m MAA.  Radiopharmaceuticals:  39. Tc-49m DTPA aerosol and 5.7 mCi Tc- 6m MAA.  Comparison: No prior nuclear imaging.  CT chest 01/05/2013.  Two- view chest x-ray subsequently same date.  Findings:  Ventilation:  Heterogeneous ventilation, with diminished ventilation in the upper lobes, consistent with  emphysema.  Perfusion:  Heterogeneous perfusion corresponding to the ventilatory abnormalities.  No segmental or subsegmental perfusion defect to suggest pulmonary embolism.  IMPRESSION: Low probability of pulmonary embolism.  Heterogeneous perfusion corresponds to the ventilatory abnormalities related to COPD/emphysema.   Original Report Authenticated By: Hulan Saas, M.D.    Dg Fluoro Guide Cv Line-no Report  01/10/2013  CLINICAL DATA: Theone Murdoch placement   FLOURO GUIDE CV LINE  Fluoroscopy was utilized by the requesting physician.  No radiographic  interpretation.        Medications:     Scheduled Medications: . atorvastatin  40 mg Oral q1800  . budesonide-formoterol  2 puff Inhalation BID  . docusate sodium  200 mg Oral QHS  . levothyroxine  50 mcg Oral QHS  . lisinopril  20 mg Oral QHS  . sodium chloride  3 mL Intravenous Q12H  . tiotropium  18 mcg Inhalation Daily     Infusions: . heparin 1,700 Units/hr (01/10/13 2316)  . milrinone 0.125 mcg/kg/min (01/10/13 2240)     PRN Medications:  sodium chloride, acetaminophen,  diazepam, ondansetron (ZOFRAN) IV, sodium chloride   Assessment:  1) Acute on chronic HF - class IV  2) Chronic AF on coumadin  3) ICM, EF 20%  4) CAD  S/p CABG 2003  5) COPD with depressed DLCO  CPAP at night  6) HLD  7) Hypothyroidism     Plan/Discussion:   Complaining of dyspnea at rest. Weight up 5 pounds. CVP 10. Give one dose of IV lasix . Watch closely given soft BP. Continue Milrinone at .125 mcg. Renal function ok.   VQ scan low probability . Lower extremity dopplers pending.   Rate controlled. Pharmacy to manage Heparin.   Length of Stay: 1   CLEGG,AMY 01/11/2013, 6:59 AM  Patient seen and examined with Tonye Becket, NP. We discussed all aspects of the encounter. I agree with the assessment and plan as stated above.   Hemodynamics minimally improved on low-dose milrinone. Will increase milrinone back to 0.25. Diurese gently as  wedge already < 20. Will ask Pulmonary to see for low DLCO (26%) to optimize as best as possible prior to VAD tomorrow.   Continue heparin for AF.  Advanced Heart Failure Team Pager [phone] (7a - 4p)  Please contact [facility] for night-coverage after hours (4p -7a ) on [email or web address] Roan Miklos,MD 9:58 AM

## 2013-01-11 NOTE — Progress Notes (Signed)
Mechanical Circulatory Support Note  [name] is a 72 y.o. with  has a past medical history of Ischemic cardiomyopathy; CHF (congestive heart failure); Sleep apnea; Intestine disorder; ICD (implantable cardiac defibrillator) in place; Hyperlipidemia; Hypothyroidism; Chronic anticoagulation; Obesity; COPD (chronic obstructive pulmonary disease); Asbestosis; Atrial fibrillation; Paroxysmal ventricular tachycardia; and Coronary artery disease.. Currently being evaluated for advanced therapies which include Left Ventricular Assist Device implantation.   Have had lengthy discussion with patient and his wife about the risks, benefits, indications and alternatives to Heartmate II VAD placement. I have reviewed in full the following; caregiver role, survival, complications, life style changes, follow up care, discharge planning, rehabilitation, VAD dressing supplies and driveline management care. The patient was provided with educational resources to take home and read more about the device offered. The patient understands that from this discussion it does not mean that they will receive the device, but that depends on an extensive evaluation process. The patient is aware of the fact that if at anytime they want to stop the evaluation process they can.   I have provided the patient with my contact information in order for them to call back if they have any questions. We have had a few discussions about the pump and [name] has had the opportunity of meeting with a patient as well. Will continue to follow and will set up for him to see Dr. Gala Romney for [facility].  Aundria Rud, NP-C VAD Coordinator

## 2013-01-11 NOTE — Progress Notes (Signed)
Heparin level with am labs resulted as slightly subtherapeutic though drawn only ~4hr after rate change.  Will continue gtt for now and recheck level.  Vernard Gambles, PharmD, BCPS  01/11/2013 4:28 AM

## 2013-01-11 NOTE — Progress Notes (Signed)
Mr. Spiering has been discussed with the VAD Medical Review board multiple times, including today (01/11/13). The team feels as if the patient is a good candidate for Destination LVAD therapy. The patient meets criteria for a LVAD implant as listed below:  1)  Have failed to respond to optimal medical management (including beta-blockers, and  ACE inhibitors if tolerated) for at least 45 of the last 60 days, or have been balloon dependent for 7 days, or IV inotrope dependent for 14 days; and,             Failed medical therapy > 60/60 days. Started inotropes on 01/10/13.      On Inotropes Yes started 01/10/13  2)  Have a left ventricular ejection fraction (LVEF) < 25%; and, have demonstrated functional limitation with a peak oxygen consumption of <14 ml/kg/min unless balloon  pump or inotrope dependent or physically unable to perform the test.         EF 20%  By LV Gram 01/07/13            CPX (08/12/12)  pVO2 9.2% RER 1.02 VE/VCo2 slope 52.8            Inotrope dependent as of 01/10/13  3)  Social work and palliative care evaluations demonstrate appropriate support system in place for discharge to home with a VAD and that end of life discussions have taken place.         Social work consult (01/11/13)        Palliative Care Consult: Preliminary discussions had with Palliative Care team and has approval. Formal consult pending (did not have time to schedule full evaluation pre-op due to rapid deterioration in condition).  Patient has a living will and has brought it in to the hospital.  4)  Primary caretaker identified that can be taught along with the patient how to manage        the VAD equipment.       Name: Keishawn Rajewski (wife)  5)  Deemed appropriate by our Presenter, broadcasting.        Prior approval: 01/08/13 Forestine Na)  6)  VAD Coordinator has met with patient and caregiver and shown them the actual VAD equipment. Along with showing them the equipment the VAD Coordinator has discussed  with the patient and caregiver about lifestyle changes and the consent form.        Met with Mr. Macmaster and wife multiple times from November 2013 to currently.  7)  Patient has been discussed with [facility] (Dr.Milano) and felt to be an appropriate candidate.        Patient refuses transplant evaluation. Not felt to be transplant candidate due to lung disease.   Maralyn Witherell,MD 4:21 PM

## 2013-01-11 NOTE — Preoperative (Signed)
Beta Blockers   Reason not to administer Beta Blockers:Not Applicable 

## 2013-01-11 NOTE — Progress Notes (Addendum)
1510 -1535 Cardiac Rehab Pt is unable to ambulate. Completed pre op with pt and wife. They voice understanding. Gave them pre op surgery booklet and put pre op video on for them to watch. Melina Copa RN

## 2013-01-11 NOTE — Progress Notes (Signed)
ANTICOAGULATION CONSULT NOTE - Follow Up Consult  Pharmacy Consult for heparin Indication: atrial fibrillation and awaiting LVAD  Labs:  Recent Labs  01/10/13 1048 01/10/13 1420 01/10/13 2230 01/11/13 0340 01/11/13 0752  HGB  --  9.1*  --  8.8*  --   HCT  --  30.3*  --  28.9*  --   PLT  --  145*  --  130*  --   HEPARINUNFRC  --   --  0.19* 0.27* 0.29*  CREATININE 0.97  --   --  0.84  --     Assessment: Admit Complaint: 72 yo male admitted 01/10/2013   from home for LVAD on Tues 4/15; pre-op tune up with milrinone. Hx ICM, EF 25%; s/p CABG x 3 2003. Wt = 227 lbs from 4/10. Heparin wt ~ 100 kg  Events:  Coox unchanged on milrinone, to increase dose  Anticoagulation: Afib - bridge with heparin pre-VAD. INR 4/10 = 1.3 no coumadin PTA since then. Checking pre-op VQ scan and venous dopplers to r/o any clot. HL 0.29; no bleeding noted  Goal of Therapy:  Heparin level 0.3-0.7 units/ml   Plan:  1. Increase heparin to 1850 units/hr check heparin level in 6 hours 2. Follow up time to dc heparin in am pre OR.  Thank you for allowing pharmacy to be a part of this patients care team.  Lovenia Kim Pharm.D., BCPS Clinical Pharmacist 01/11/2013 9:26 AM Pager: [phone] Phone: [phone]

## 2013-01-11 NOTE — Progress Notes (Signed)
Bilateral lower extremity venous duplex:  No evidence of DVT, superficial thrombosis, or Baker's Cyst.   

## 2013-01-11 NOTE — Progress Notes (Signed)
ANTICOAGULATION CONSULT NOTE - Follow Up Consult  Pharmacy Consult for heparin Indication: atrial fibrillation and awaiting LVAD  Labs:  Recent Labs  01/10/13 1048 01/10/13 1420  01/11/13 0340 01/11/13 0752 01/11/13 1530  HGB  --  9.1*  --  8.8*  --   --   HCT  --  30.3*  --  28.9*  --   --   PLT  --  145*  --  130*  --   --   HEPARINUNFRC  --   --   < > 0.27* 0.29* 0.42  CREATININE 0.97  --   --  0.84  --   --   < > = values in this interval not displayed.  Medication: . heparin    . milrinone 0.25 mcg/kg/min (01/11/13 0946)  . [DISCONTINUED] heparin 1,850 Units/hr (01/11/13 0946)   Assessment: Admit Complaint: 72 yo male admitted 01/10/2013   from home for LVAD on Tues 4/15; pre-op tune up with milrinone. Hx ICM, EF 25%; s/p CABG x 3 2003. Wt = 227 lbs from 4/10. Heparin wt ~ 100 kg  Anticoagulation: Afib - bridge with heparin pre-VAD. INR 4/10 = 1.3 no Coumadin PTA since then. Heparin level is therapeutic at 0.42. No bleeding noted.  Goal of Therapy:  Heparin level 0.3-0.7 units/ml Monitor platelets by anticoagulation protocol: Yes    Plan:  -Continue heparin drip at 1850 units/hr -Heparin to be turned off 4/15 at 06:20  [facility], Pharm.D., BCPS Clinical Pharmacist Pager: [phone] 01/11/2013 8:12 PM

## 2013-01-12 ENCOUNTER — Inpatient Hospital Stay (HOSPITAL_COMMUNITY): Payer: Medicare Other

## 2013-01-12 ENCOUNTER — Encounter (HOSPITAL_COMMUNITY): Payer: Self-pay | Admitting: Anesthesiology

## 2013-01-12 ENCOUNTER — Inpatient Hospital Stay (HOSPITAL_COMMUNITY): Payer: Medicare Other | Admitting: Anesthesiology

## 2013-01-12 ENCOUNTER — Encounter (HOSPITAL_COMMUNITY)
Admission: AD | Disposition: A | Discharge status: Home / Self Care | Payer: Self-pay | Source: Ambulatory Visit | Attending: Cardiothoracic Surgery

## 2013-01-12 DIAGNOSIS — I5023 Acute on chronic systolic (congestive) heart failure: Secondary | ICD-10-CM

## 2013-01-12 HISTORY — PX: INSERTION OF IMPLANTABLE LEFT VENTRICULAR ASSIST DEVICE: SHX5866

## 2013-01-12 HISTORY — PX: INTRAOPERATIVE TRANSESOPHAGEAL ECHOCARDIOGRAM: SHX5062

## 2013-01-12 LAB — POCT I-STAT 3, ART BLOOD GAS (G3+)
Acid-Base Excess: 1 mmol/L (ref 0.0–2.0)
Acid-base deficit: 2 mmol/L (ref 0.0–2.0)
Acid-base deficit: 1 mmol/L (ref 0.0–2.0)
Bicarbonate: 24 mEq/L (ref 20.0–24.0)
Bicarbonate: 25 mEq/L — ABNORMAL HIGH (ref 20.0–24.0)
Bicarbonate: 26.5 mEq/L — ABNORMAL HIGH (ref 20.0–24.0)
Bicarbonate: 24.9 mEq/L — ABNORMAL HIGH (ref 20.0–24.0)
O2 Saturation: 100 %
O2 Saturation: 100 %
O2 Saturation: 98 %
O2 Saturation: 99 %
Patient temperature: 37
Patient temperature: 37.4
TCO2: 25 mmol/L (ref 0–100)
TCO2: 26 mmol/L (ref 0–100)
TCO2: 28 mmol/L (ref 0–100)
TCO2: 26 mmol/L (ref 0–100)
pCO2 arterial: 44.5 mmHg (ref 35.0–45.0)
pCO2 arterial: 39.6 mmHg (ref 35.0–45.0)
pCO2 arterial: 47.3 mmHg — ABNORMAL HIGH (ref 35.0–45.0)
pCO2 arterial: 44.3 mmHg (ref 35.0–45.0)
pH, Arterial: 7.34 — ABNORMAL LOW (ref 7.350–7.450)
pH, Arterial: 7.408 (ref 7.350–7.450)
pH, Arterial: 7.356 (ref 7.350–7.450)
pH, Arterial: 7.36 (ref 7.350–7.450)
pO2, Arterial: 277 mmHg — ABNORMAL HIGH (ref 80.0–100.0)
pO2, Arterial: 291 mmHg — ABNORMAL HIGH (ref 80.0–100.0)
pO2, Arterial: 106 mmHg — ABNORMAL HIGH (ref 80.0–100.0)
pO2, Arterial: 134 mmHg — ABNORMAL HIGH (ref 80.0–100.0)

## 2013-01-12 LAB — POCT I-STAT 4, (NA,K, GLUC, HGB,HCT)
Glucose, Bld: 125 mg/dL — ABNORMAL HIGH (ref 70–99)
Glucose, Bld: 126 mg/dL — ABNORMAL HIGH (ref 70–99)
Glucose, Bld: 135 mg/dL — ABNORMAL HIGH (ref 70–99)
Glucose, Bld: 166 mg/dL — ABNORMAL HIGH (ref 70–99)
Glucose, Bld: 136 mg/dL — ABNORMAL HIGH (ref 70–99)
HCT: 26 % — ABNORMAL LOW (ref 39.0–52.0)
HCT: 28 % — ABNORMAL LOW (ref 39.0–52.0)
HCT: 26 % — ABNORMAL LOW (ref 39.0–52.0)
HCT: 25 % — ABNORMAL LOW (ref 39.0–52.0)
HCT: 26 % — ABNORMAL LOW (ref 39.0–52.0)
Hemoglobin: 8.8 g/dL — ABNORMAL LOW (ref 13.0–17.0)
Hemoglobin: 9.5 g/dL — ABNORMAL LOW (ref 13.0–17.0)
Hemoglobin: 8.8 g/dL — ABNORMAL LOW (ref 13.0–17.0)
Hemoglobin: 8.5 g/dL — ABNORMAL LOW (ref 13.0–17.0)
Hemoglobin: 8.8 g/dL — ABNORMAL LOW (ref 13.0–17.0)
Potassium: 4.2 mEq/L (ref 3.5–5.1)
Potassium: 4.3 mEq/L (ref 3.5–5.1)
Potassium: 4.6 mEq/L (ref 3.5–5.1)
Potassium: 3.7 mEq/L (ref 3.5–5.1)
Potassium: 3.3 mEq/L — ABNORMAL LOW (ref 3.5–5.1)
Sodium: 138 mEq/L (ref 135–145)
Sodium: 138 mEq/L (ref 135–145)
Sodium: 136 mEq/L (ref 135–145)
Sodium: 139 mEq/L (ref 135–145)
Sodium: 140 mEq/L (ref 135–145)

## 2013-01-12 LAB — CARDIOLIPIN ANTIBODIES, IGG, IGM, IGA
Anticardiolipin IgA: 4 APL U/mL — ABNORMAL LOW (ref <22)
Anticardiolipin IgG: 12 GPL U/mL (ref <23)
Anticardiolipin IgM: 6 MPL U/mL — ABNORMAL LOW (ref <11)

## 2013-01-12 LAB — BLOOD GAS, ARTERIAL
Acid-Base Excess: 0.1 mmol/L (ref 0.0–2.0)
Bicarbonate: 24.7 mEq/L — ABNORMAL HIGH (ref 20.0–24.0)
FIO2: 0.5 %
MECHVT: 620 mL
Nitric Oxide: 30
O2 Saturation: 100 %
PEEP: 5 cmH2O
Patient temperature: 98.6
RATE: 14 resp/min
TCO2: 26.1 mmol/L (ref 0–100)
pCO2 arterial: 43.4 mmHg (ref 35.0–45.0)
pH, Arterial: 7.374 (ref 7.350–7.450)
pO2, Arterial: 140 mmHg — ABNORMAL HIGH (ref 80.0–100.0)

## 2013-01-12 LAB — PROTEIN S, TOTAL: Protein S Ag, Total: 75 % (ref 60–150)

## 2013-01-12 LAB — BASIC METABOLIC PANEL
CO2: 28 mEq/L (ref 19–32)
CO2: 27 mEq/L (ref 19–32)
Chloride: 102 mEq/L (ref 96–112)
Chloride: 103 mEq/L (ref 96–112)
Creatinine, Ser: 0.71 mg/dL (ref 0.50–1.35)
GFR calc Af Amer: >90 mL/min (ref >90)
Glucose, Bld: 120 mg/dL — ABNORMAL HIGH (ref 70–99)
Potassium: 3.9 mEq/L (ref 3.5–5.1)
Potassium: 3.4 mEq/L — ABNORMAL LOW (ref 3.5–5.1)
Sodium: 136 mEq/L (ref 135–145)

## 2013-01-12 LAB — POCT I-STAT, CHEM 8
BUN: 11 mg/dL (ref 6–23)
Calcium, Ion: 1.25 mmol/L (ref 1.13–1.30)
Chloride: 104 mEq/L (ref 96–112)
Creatinine, Ser: 0.8 mg/dL (ref 0.50–1.35)
Glucose, Bld: 152 mg/dL — ABNORMAL HIGH (ref 70–99)
HCT: 24 % — ABNORMAL LOW (ref 39.0–52.0)
Hemoglobin: 8.2 g/dL — ABNORMAL LOW (ref 13.0–17.0)
Potassium: 3.5 mEq/L (ref 3.5–5.1)
Sodium: 138 mEq/L (ref 135–145)
TCO2: 25 mmol/L (ref 0–100)

## 2013-01-12 LAB — BETA-2-GLYCOPROTEIN I ABS, IGG/M/A
Beta-2 Glyco I IgG: 0 G Units (ref <20)
Beta-2-Glycoprotein I IgA: 1 A Units (ref <20)
Beta-2-Glycoprotein I IgM: 14 M Units (ref <20)

## 2013-01-12 LAB — CARBOXYHEMOGLOBIN
Carboxyhemoglobin: 1.8 % — ABNORMAL HIGH (ref 0.5–1.5)
Carboxyhemoglobin: 2.3 % — ABNORMAL HIGH (ref 0.5–1.5)
Methemoglobin: 1.7 % — ABNORMAL HIGH (ref 0.0–1.5)
O2 Saturation: 69.1 %
O2 Saturation: 67 %
Total hemoglobin: 9.3 g/dL — ABNORMAL LOW (ref 13.5–18.0)
Total hemoglobin: 7.4 g/dL — ABNORMAL LOW (ref 13.5–18.0)

## 2013-01-12 LAB — PROTEIN C ACTIVITY: Protein C Activity: 93 % (ref 75–133)

## 2013-01-12 LAB — LUPUS ANTICOAGULANT PANEL
DRVVT: 29.6 secs (ref <42.9)
Lupus Anticoagulant: NOT DETECTED
PTT Lupus Anticoagulant: 75.6 secs — ABNORMAL HIGH (ref 28.0–43.0)
PTTLA 4:1 Mix: 89.3 secs — ABNORMAL HIGH (ref 28.0–43.0)
PTTLA Confirmation: 2.7 secs (ref <8.0)

## 2013-01-12 LAB — CREATININE, SERUM
Creatinine, Ser: 0.76 mg/dL (ref 0.50–1.35)
GFR calc Af Amer: >90 mL/min (ref >90)
GFR calc non Af Amer: 90 mL/min — ABNORMAL LOW (ref >90)

## 2013-01-12 LAB — PREPARE RBC (CROSSMATCH)

## 2013-01-12 LAB — CBC
HCT: 29.5 % — ABNORMAL LOW (ref 39.0–52.0)
HCT: 23.5 % — ABNORMAL LOW (ref 39.0–52.0)
Hemoglobin: 9.1 g/dL — ABNORMAL LOW (ref 13.0–17.0)
Hemoglobin: 7.6 g/dL — ABNORMAL LOW (ref 13.0–17.0)
Hemoglobin: 8.2 g/dL — ABNORMAL LOW (ref 13.0–17.0)
MCH: 25.1 pg — ABNORMAL LOW (ref 26.0–34.0)
MCH: 26.4 pg (ref 26.0–34.0)
MCHC: 30.8 g/dL (ref 30.0–36.0)
MCV: 81.5 fL (ref 78.0–100.0)
MCV: 79.1 fL (ref 78.0–100.0)
Platelets: 135 10*3/uL — ABNORMAL LOW (ref 150–400)
RBC: 3.62 MIL/uL — ABNORMAL LOW (ref 4.22–5.81)
RBC: 3.11 MIL/uL — ABNORMAL LOW (ref 4.22–5.81)
RDW: 16.3 % — ABNORMAL HIGH (ref 11.5–15.5)
WBC: 8.5 10*3/uL (ref 4.0–10.5)
WBC: 10.6 10*3/uL — ABNORMAL HIGH (ref 4.0–10.5)
WBC: 11.1 10*3/uL — ABNORMAL HIGH (ref 4.0–10.5)

## 2013-01-12 LAB — PROTEIN C, TOTAL: Protein C, Total: 56 % — ABNORMAL LOW (ref 72–160)

## 2013-01-12 LAB — PROTEIN S ACTIVITY: Protein S Activity: 81 % (ref 69–129)

## 2013-01-12 LAB — HOMOCYSTEINE: Homocysteine: 10.2 umol/L (ref 4.0–15.4)

## 2013-01-12 LAB — DIC (DISSEMINATED INTRAVASCULAR COAGULATION)PANEL
INR: 1.38 (ref 0.00–1.49)
Smear Review: NONE SEEN
aPTT: 36 seconds (ref 24–37)

## 2013-01-12 LAB — MAGNESIUM: Magnesium: 1.6 mg/dL (ref 1.5–2.5)

## 2013-01-12 LAB — GLUCOSE, CAPILLARY: Glucose-Capillary: 116 mg/dL — ABNORMAL HIGH (ref 70–99)

## 2013-01-12 LAB — HEPARIN LEVEL (UNFRACTIONATED): Heparin Unfractionated: 0.53 IU/mL (ref 0.30–0.70)

## 2013-01-12 LAB — HEMOGLOBIN AND HEMATOCRIT, BLOOD
HCT: 26.3 % — ABNORMAL LOW (ref 39.0–52.0)
Hemoglobin: 8.5 g/dL — ABNORMAL LOW (ref 13.0–17.0)

## 2013-01-12 LAB — PLATELET COUNT: Platelets: 115 10*3/uL — ABNORMAL LOW (ref 150–400)

## 2013-01-12 SURGERY — INSERTION OF IMPLANTABLE LEFT VENTRICULAR ASSIST DEVICE
Anesthesia: General | Site: Chest | Wound class: Clean

## 2013-01-12 MED ORDER — SODIUM CHLORIDE 0.9 % IV SOLN
INTRAVENOUS | Status: DC
  Administered 2013-01-12: 2.9 [IU]/h via INTRAVENOUS
  Filled 2013-01-12 (×2): qty 1

## 2013-01-12 MED ORDER — ETOMIDATE 2 MG/ML IV SOLN
INTRAVENOUS | Status: DC | PRN
  Administered 2013-01-12: 12 mg via INTRAVENOUS

## 2013-01-12 MED ORDER — SODIUM CHLORIDE 0.9 % IV SOLN
20.0000 ug | INTRAVENOUS | Status: DC | PRN
  Administered 2013-01-12: 20 ug via INTRAVENOUS

## 2013-01-12 MED ORDER — FLUCONAZOLE IN SODIUM CHLORIDE 400-0.9 MG/200ML-% IV SOLN
400.0000 mg | Freq: Once | INTRAVENOUS | Status: AC
  Administered 2013-01-13: 400 mg via INTRAVENOUS
  Filled 2013-01-12: qty 200

## 2013-01-12 MED ORDER — CHLORHEXIDINE GLUCONATE 0.12 % MT SOLN
15.0000 mL | Freq: Two times a day (BID) | OROMUCOSAL | Status: DC
  Administered 2013-01-12 – 2013-01-13 (×2): 15 mL via OROMUCOSAL
  Filled 2013-01-12: qty 15

## 2013-01-12 MED ORDER — DEXTROSE 5 % IV SOLN
1.5000 g | Freq: Two times a day (BID) | INTRAVENOUS | Status: AC
  Administered 2013-01-12 – 2013-01-14 (×4): 1.5 g via INTRAVENOUS
  Filled 2013-01-12 (×4): qty 1.5

## 2013-01-12 MED ORDER — CHLORHEXIDINE GLUCONATE 0.12 % MT SOLN
OROMUCOSAL | Status: AC
  Filled 2013-01-12: qty 15

## 2013-01-12 MED ORDER — POTASSIUM CHLORIDE 10 MEQ/50ML IV SOLN
10.0000 meq | INTRAVENOUS | Status: AC
  Administered 2013-01-12 (×3): 10 meq via INTRAVENOUS
  Filled 2013-01-12: qty 50

## 2013-01-12 MED ORDER — ASPIRIN 300 MG RE SUPP
300.0000 mg | Freq: Every day | RECTAL | Status: DC
  Filled 2013-01-12: qty 1

## 2013-01-12 MED ORDER — LIDOCAINE HCL (CARDIAC) 20 MG/ML IV SOLN
INTRAVENOUS | Status: DC | PRN
  Administered 2013-01-12: 100 mg via INTRAVENOUS

## 2013-01-12 MED ORDER — ACETAMINOPHEN 500 MG PO TABS
1000.0000 mg | ORAL_TABLET | Freq: Four times a day (QID) | ORAL | Status: AC
  Administered 2013-01-13 – 2013-01-17 (×17): 1000 mg via ORAL
  Filled 2013-01-12 (×18): qty 2

## 2013-01-12 MED ORDER — INSULIN REGULAR BOLUS VIA INFUSION
0.0000 [IU] | Freq: Three times a day (TID) | INTRAVENOUS | Status: DC
  Filled 2013-01-12: qty 10

## 2013-01-12 MED ORDER — 0.9 % SODIUM CHLORIDE (POUR BTL) OPTIME
TOPICAL | Status: DC | PRN
  Administered 2013-01-12: 1000 mL

## 2013-01-12 MED ORDER — ACETAMINOPHEN 160 MG/5ML PO SOLN
975.0000 mg | Freq: Four times a day (QID) | ORAL | Status: DC
  Administered 2013-01-12 – 2013-01-13 (×3): 975 mg
  Filled 2013-01-12: qty 40.6
  Filled 2013-01-12 (×2): qty 20.3

## 2013-01-12 MED ORDER — SODIUM CHLORIDE 0.9 % IV SOLN: 250.0000 mL | INTRAVENOUS | Status: DC

## 2013-01-12 MED ORDER — HEMOSTATIC AGENTS (NO CHARGE) OPTIME
TOPICAL | Status: DC | PRN
  Administered 2013-01-12: 1 via TOPICAL

## 2013-01-12 MED ORDER — DEXTROSE 5 % IV SOLN
0.0000 ug/min | INTRAVENOUS | Status: DC
  Administered 2013-01-13: 8 ug/min via INTRAVENOUS
  Administered 2013-01-14: 5 ug/min via INTRAVENOUS
  Filled 2013-01-12 (×3): qty 8

## 2013-01-12 MED ORDER — DOCUSATE SODIUM 100 MG PO CAPS
200.0000 mg | ORAL_CAPSULE | Freq: Every day | ORAL | Status: DC
  Administered 2013-01-14 – 2013-01-22 (×8): 200 mg via ORAL
  Filled 2013-01-12 (×9): qty 2

## 2013-01-12 MED ORDER — POTASSIUM CHLORIDE 10 MEQ/50ML IV SOLN
10.0000 meq | Freq: Once | INTRAVENOUS | Status: AC
  Administered 2013-01-12: 10 meq via INTRAVENOUS

## 2013-01-12 MED ORDER — AMIODARONE LOAD VIA INFUSION
150.0000 mg | Freq: Once | INTRAVENOUS | Status: AC
  Administered 2013-01-12: 150 mg via INTRAVENOUS

## 2013-01-12 MED ORDER — MIDAZOLAM HCL 5 MG/5ML IJ SOLN
INTRAMUSCULAR | Status: DC | PRN
  Administered 2013-01-12 (×3): 2 mg via INTRAVENOUS
  Administered 2013-01-12: 1 mg via INTRAVENOUS

## 2013-01-12 MED ORDER — OXYCODONE HCL 5 MG PO TABS
5.0000 mg | ORAL_TABLET | ORAL | Status: DC | PRN
  Administered 2013-01-13 (×2): 10 mg via ORAL
  Administered 2013-01-14: 5 mg via ORAL
  Administered 2013-01-14: 10 mg via ORAL
  Filled 2013-01-12: qty 1
  Filled 2013-01-12 (×3): qty 2

## 2013-01-12 MED ORDER — BISACODYL 10 MG RE SUPP: 10.0000 mg | Freq: Every day | RECTAL | Status: DC

## 2013-01-12 MED ORDER — PHENYLEPHRINE HCL 10 MG/ML IJ SOLN
0.0000 ug/min | INTRAVENOUS | Status: DC
  Filled 2013-01-12: qty 2

## 2013-01-12 MED ORDER — AMINOCAPROIC ACID 250 MG/ML IV SOLN
1.0000 g/h | INTRAVENOUS | Status: DC
  Filled 2013-01-12: qty 20

## 2013-01-12 MED ORDER — RIFAMPIN 300 MG PO CAPS
600.0000 mg | ORAL_CAPSULE | Freq: Once | ORAL | Status: AC
  Administered 2013-01-13: 600 mg via ORAL
  Filled 2013-01-12: qty 2

## 2013-01-12 MED ORDER — ASPIRIN EC 325 MG PO TBEC
325.0000 mg | DELAYED_RELEASE_TABLET | Freq: Every day | ORAL | Status: DC
  Administered 2013-01-14 – 2013-01-21 (×8): 325 mg via ORAL
  Filled 2013-01-12 (×11): qty 1

## 2013-01-12 MED ORDER — SODIUM CHLORIDE 0.45 % IV SOLN
INTRAVENOUS | Status: DC
  Administered 2013-01-17: 03:00:00 via INTRAVENOUS

## 2013-01-12 MED ORDER — LACTATED RINGERS IV SOLN: 500.0000 mL | Freq: Once | INTRAVENOUS | Status: AC | PRN

## 2013-01-12 MED ORDER — NOREPINEPHRINE BITARTRATE 1 MG/ML IJ SOLN
2.0000 ug/min | INTRAVENOUS | Status: DC
  Filled 2013-01-12: qty 8

## 2013-01-12 MED ORDER — DOPAMINE-DEXTROSE 3.2-5 MG/ML-% IV SOLN
3.0000 ug/kg/min | INTRAVENOUS | Status: DC
  Filled 2013-01-12: qty 250

## 2013-01-12 MED ORDER — ONDANSETRON HCL 4 MG/2ML IJ SOLN
4.0000 mg | Freq: Four times a day (QID) | INTRAMUSCULAR | Status: DC | PRN
  Administered 2013-01-14 – 2013-01-15 (×3): 4 mg via INTRAVENOUS
  Filled 2013-01-12 (×3): qty 2

## 2013-01-12 MED ORDER — ALBUMIN HUMAN 5 % IV SOLN
INTRAVENOUS | Status: DC | PRN
  Administered 2013-01-12 (×3): via INTRAVENOUS

## 2013-01-12 MED ORDER — POTASSIUM CHLORIDE 10 MEQ/50ML IV SOLN
10.0000 meq | INTRAVENOUS | Status: AC | PRN
  Administered 2013-01-12 (×3): 10 meq via INTRAVENOUS

## 2013-01-12 MED ORDER — DEXMEDETOMIDINE HCL IN NACL 200 MCG/50ML IV SOLN
0.1000 ug/kg/h | INTRAVENOUS | Status: DC
  Administered 2013-01-12 – 2013-01-13 (×7): 0.7 ug/kg/h via INTRAVENOUS
  Filled 2013-01-12 (×6): qty 50
  Filled 2013-01-12: qty 100

## 2013-01-12 MED ORDER — PROTAMINE SULFATE 10 MG/ML IV SOLN
INTRAVENOUS | Status: DC | PRN
  Administered 2013-01-12 (×2): 50 mg via INTRAVENOUS
  Administered 2013-01-12: 60 mg via INTRAVENOUS
  Administered 2013-01-12: 20 mg via INTRAVENOUS
  Administered 2013-01-12 (×2): 50 mg via INTRAVENOUS

## 2013-01-12 MED ORDER — DEXMEDETOMIDINE HCL IN NACL 400 MCG/100ML IV SOLN: 0.4000 ug/kg/h | INTRAVENOUS | Status: DC

## 2013-01-12 MED ORDER — BIOTENE DRY MOUTH MT LIQD
15.0000 mL | Freq: Four times a day (QID) | OROMUCOSAL | Status: DC
  Administered 2013-01-12 – 2013-01-13 (×4): 15 mL via OROMUCOSAL

## 2013-01-12 MED ORDER — FAMOTIDINE IN NACL 20-0.9 MG/50ML-% IV SOLN
20.0000 mg | Freq: Two times a day (BID) | INTRAVENOUS | Status: AC
  Administered 2013-01-12 – 2013-01-13 (×2): 20 mg via INTRAVENOUS
  Filled 2013-01-12: qty 50

## 2013-01-12 MED ORDER — PANTOPRAZOLE SODIUM 40 MG PO TBEC
40.0000 mg | DELAYED_RELEASE_TABLET | Freq: Every day | ORAL | Status: DC
  Administered 2013-01-14 – 2013-01-22 (×9): 40 mg via ORAL
  Filled 2013-01-12 (×9): qty 1

## 2013-01-12 MED ORDER — AMIODARONE HCL IN DEXTROSE 360-4.14 MG/200ML-% IV SOLN
30.0000 mg/h | INTRAVENOUS | Status: DC
  Administered 2013-01-12: 30 mg/h via INTRAVENOUS
  Filled 2013-01-12 (×2): qty 200

## 2013-01-12 MED ORDER — AMIODARONE HCL IN DEXTROSE 360-4.14 MG/200ML-% IV SOLN
15.0000 mg/h | INTRAVENOUS | Status: DC
  Administered 2013-01-13: 15 mg/h via INTRAVENOUS
  Filled 2013-01-12 (×4): qty 200

## 2013-01-12 MED ORDER — SODIUM CHLORIDE 0.9 % IJ SOLN
3.0000 mL | Freq: Two times a day (BID) | INTRAMUSCULAR | Status: DC
Start: 2013-01-13 — End: 2013-01-15

## 2013-01-12 MED ORDER — FENTANYL CITRATE 0.05 MG/ML IJ SOLN
INTRAMUSCULAR | Status: DC | PRN
  Administered 2013-01-12: 25 ug via INTRAVENOUS
  Administered 2013-01-12: 100 ug via INTRAVENOUS
  Administered 2013-01-12: 175 ug via INTRAVENOUS
  Administered 2013-01-12: 100 ug via INTRAVENOUS
  Administered 2013-01-12: 50 ug via INTRAVENOUS
  Administered 2013-01-12: 100 ug via INTRAVENOUS
  Administered 2013-01-12: 250 ug via INTRAVENOUS

## 2013-01-12 MED ORDER — NITROGLYCERIN IN D5W 200-5 MCG/ML-% IV SOLN: 0.0000 ug/min | INTRAVENOUS | Status: DC

## 2013-01-12 MED ORDER — ALBUMIN HUMAN 5 % IV SOLN
250.0000 mL | INTRAVENOUS | Status: AC | PRN
  Administered 2013-01-12 (×2): 250 mL via INTRAVENOUS

## 2013-01-12 MED ORDER — VANCOMYCIN HCL 1000 MG IV SOLR
INTRAVENOUS | Status: DC | PRN
  Administered 2013-01-12: 1000 mg

## 2013-01-12 MED ORDER — AMIODARONE HCL IN DEXTROSE 360-4.14 MG/200ML-% IV SOLN: 60.0000 mg/h | INTRAVENOUS | Status: DC

## 2013-01-12 MED ORDER — MORPHINE SULFATE 2 MG/ML IJ SOLN
1.0000 mg | INTRAMUSCULAR | Status: AC | PRN
  Administered 2013-01-12: 2 mg via INTRAVENOUS
  Filled 2013-01-12: qty 1

## 2013-01-12 MED ORDER — VANCOMYCIN HCL IN DEXTROSE 1-5 GM/200ML-% IV SOLN
1000.0000 mg | Freq: Two times a day (BID) | INTRAVENOUS | Status: AC
  Administered 2013-01-12 – 2013-01-13 (×3): 1000 mg via INTRAVENOUS
  Filled 2013-01-12 (×3): qty 200

## 2013-01-12 MED ORDER — SODIUM CHLORIDE 0.9 % IR SOLN
Status: DC | PRN
  Administered 2013-01-12: 10:00:00

## 2013-01-12 MED ORDER — SODIUM CHLORIDE 0.9 % IR SOLN
Status: DC | PRN
  Administered 2013-01-12: 3000 mL

## 2013-01-12 MED ORDER — MIDAZOLAM HCL 2 MG/2ML IJ SOLN
2.0000 mg | INTRAMUSCULAR | Status: DC | PRN
  Administered 2013-01-12: 2 mg via INTRAVENOUS
  Administered 2013-01-14: 1 mg via INTRAVENOUS
  Filled 2013-01-12 (×3): qty 2

## 2013-01-12 MED ORDER — VANCOMYCIN HCL 1000 MG IV SOLR
INTRAVENOUS | Status: AC
  Filled 2013-01-12: qty 1000

## 2013-01-12 MED ORDER — ASPIRIN 81 MG PO CHEW: 324.0000 mg | CHEWABLE_TABLET | Freq: Every day | ORAL | Status: DC

## 2013-01-12 MED ORDER — VANCOMYCIN HCL 1000 MG IV SOLR
INTRAVENOUS | Status: AC
  Filled 2013-01-12: qty 2000

## 2013-01-12 MED ORDER — LACTATED RINGERS IV SOLN
INTRAVENOUS | Status: DC | PRN
  Administered 2013-01-12: 07:00:00 via INTRAVENOUS

## 2013-01-12 MED ORDER — MILRINONE IN DEXTROSE 20 MG/100ML IV SOLN
0.2500 ug/kg/min | INTRAVENOUS | Status: DC
  Filled 2013-01-12: qty 100

## 2013-01-12 MED ORDER — MORPHINE SULFATE 2 MG/ML IJ SOLN
2.0000 mg | INTRAMUSCULAR | Status: DC | PRN
  Administered 2013-01-12 – 2013-01-13 (×4): 2 mg via INTRAVENOUS
  Filled 2013-01-12 (×4): qty 1

## 2013-01-12 MED ORDER — SODIUM CHLORIDE 0.9 % IJ SOLN: 3.0000 mL | INTRAMUSCULAR | Status: DC | PRN

## 2013-01-12 MED ORDER — BISACODYL 5 MG PO TBEC
10.0000 mg | DELAYED_RELEASE_TABLET | Freq: Every day | ORAL | Status: DC
  Administered 2013-01-14 – 2013-01-22 (×7): 10 mg via ORAL
  Filled 2013-01-12 (×9): qty 2

## 2013-01-12 MED ORDER — SODIUM CHLORIDE 0.9 % IV SOLN: INTRAVENOUS | Status: DC

## 2013-01-12 MED ORDER — DESMOPRESSIN ACETATE 4 MCG/ML IJ SOLN
20.0000 ug | Freq: Once | INTRAMUSCULAR | Status: DC
  Filled 2013-01-12 (×2): qty 5

## 2013-01-12 MED ORDER — ROCURONIUM BROMIDE 100 MG/10ML IV SOLN
INTRAVENOUS | Status: DC | PRN
  Administered 2013-01-12 (×5): 50 mg via INTRAVENOUS

## 2013-01-12 MED ORDER — HEPARIN SODIUM (PORCINE) 1000 UNIT/ML IJ SOLN
INTRAMUSCULAR | Status: DC | PRN
  Administered 2013-01-12: 5000 [IU] via INTRAVENOUS
  Administered 2013-01-12: 26000 [IU] via INTRAVENOUS

## 2013-01-12 MED ORDER — ACETAMINOPHEN 10 MG/ML IV SOLN
1000.0000 mg | Freq: Once | INTRAVENOUS | Status: AC
  Administered 2013-01-12: 1000 mg via INTRAVENOUS
  Filled 2013-01-12: qty 100

## 2013-01-12 MED ORDER — LACTATED RINGERS IV SOLN: INTRAVENOUS | Status: DC

## 2013-01-12 MED ORDER — MILRINONE IN DEXTROSE 20 MG/100ML IV SOLN
0.1250 ug/kg/min | INTRAVENOUS | Status: DC
  Administered 2013-01-12: 0.3 ug/kg/min via INTRAVENOUS
  Administered 2013-01-13 (×2): 0.4 ug/kg/min via INTRAVENOUS
  Administered 2013-01-14: 0.3 ug/kg/min via INTRAVENOUS
  Administered 2013-01-15: 0.2 ug/kg/min via INTRAVENOUS
  Administered 2013-01-16: 0.125 ug/kg/min via INTRAVENOUS
  Filled 2013-01-12 (×8): qty 100

## 2013-01-12 MED ORDER — MAGNESIUM SULFATE 40 MG/ML IJ SOLN
4.0000 g | Freq: Once | INTRAMUSCULAR | Status: AC
  Administered 2013-01-12: 4 g via INTRAVENOUS
  Filled 2013-01-12: qty 100

## 2013-01-12 MED FILL — Sodium Chloride IV Soln 0.9%: INTRAVENOUS | Qty: 250 | Status: AC

## 2013-01-12 MED FILL — Magnesium Sulfate Inj 50%: INTRAMUSCULAR | Qty: 10 | Status: AC

## 2013-01-12 MED FILL — Vasopressin Inj 20 Unit/ML: INTRAMUSCULAR | Qty: 3 | Status: AC

## 2013-01-12 MED FILL — Potassium Chloride Inj 2 mEq/ML: INTRAVENOUS | Qty: 40 | Status: AC

## 2013-01-12 MED FILL — Dextrose Inj 5%: INTRAVENOUS | Qty: 250 | Status: AC

## 2013-01-12 MED FILL — Norepinephrine Bitartrate IV Soln 1 MG/ML (Base Equivalent): INTRAMUSCULAR | Qty: 8 | Status: AC

## 2013-01-12 SURGICAL SUPPLY — 122 items
ADAPTER CARDIO PERF ANTE/RETRO (ADAPTER) ×4 IMPLANT
ADAPTER DLP PERFUSION .25INX2I (MISCELLANEOUS) ×4 IMPLANT
ADPR CRDPLG .25X.64 STRL (MISCELLANEOUS) ×2
ADPR PRFSN 84XANTGRD RTRGD (ADAPTER) ×2
APPLICATOR CHLORAPREP 3ML ORNG (MISCELLANEOUS) ×4 IMPLANT
ATTRACTOMAT 16X20 MAGNETIC DRP (DRAPES) ×4 IMPLANT
BAG DECANTER FOR FLEXI CONT (MISCELLANEOUS) ×12 IMPLANT
BLADE STERNUM SYSTEM 6 (BLADE) ×4 IMPLANT
BLADE SURG 10 STRL SS (BLADE) ×6 IMPLANT
BLADE SURG 12 STRL SS (BLADE) ×4 IMPLANT
BLADE SURG 15 STRL LF DISP TIS (BLADE) ×2 IMPLANT
BLADE SURG 15 STRL SS (BLADE) ×8
BLADE SURG ROTATE 9660 (MISCELLANEOUS) ×2 IMPLANT
CANISTER SUCTION 2500CC (MISCELLANEOUS) ×4 IMPLANT
CANNULA ARTERIAL NVNT 3/8 20FR (MISCELLANEOUS) ×2 IMPLANT
CANNULA SOFTFLOW AORTIC 7M21FR (CANNULA) ×2 IMPLANT
CANNULA VENOUS LOW PROF 34X46 (CANNULA) ×4 IMPLANT
CATH CPB KIT VANTRIGT (MISCELLANEOUS) IMPLANT
CATH FOLEY 2WAY SLVR  5CC 14FR (CATHETERS) ×2
CATH FOLEY 2WAY SLVR 5CC 14FR (CATHETERS) ×2 IMPLANT
CATH HEART VENT LEFT (CATHETERS) ×2 IMPLANT
CATH HYDRAGLIDE XL THORACIC (CATHETERS) ×4 IMPLANT
CATH ROBINSON RED A/P 18FR (CATHETERS) ×8 IMPLANT
CATH THORACIC 28FR (CATHETERS) IMPLANT
CATH THORACIC 36FR RT ANG (CATHETERS) IMPLANT
CHLORAPREP W/TINT 26ML (MISCELLANEOUS) ×4 IMPLANT
CLOTH BEACON ORANGE TIMEOUT ST (SAFETY) ×4 IMPLANT
CONN ST 1/4X3/8  BEN (MISCELLANEOUS) ×4
CONN ST 1/4X3/8 BEN (MISCELLANEOUS) ×4 IMPLANT
CONT SPEC 4OZ CLIKSEAL STRL BL (MISCELLANEOUS) ×2 IMPLANT
COVER SURGICAL LIGHT HANDLE (MISCELLANEOUS) ×6 IMPLANT
CRADLE DONUT ADULT HEAD (MISCELLANEOUS) ×4 IMPLANT
DRAIN CHANNEL 28F RND 3/8 FF (WOUND CARE) IMPLANT
DRAIN CHANNEL 32F RND 10.7 FF (WOUND CARE) IMPLANT
DRAPE BILATERAL SPLIT (DRAPES) ×4 IMPLANT
DRAPE CARDIOVASCULAR INCISE (DRAPES) ×4
DRAPE INCISE IOBAN 66X45 STRL (DRAPES) ×4 IMPLANT
DRAPE SLUSH MACHINE 52X66 (DRAPES) ×2 IMPLANT
DRAPE SLUSH/WARMER DISC (DRAPES) ×4 IMPLANT
DRAPE SRG 135X102X78XABS (DRAPES) ×2 IMPLANT
DRSG COVADERM 4X14 (GAUZE/BANDAGES/DRESSINGS) ×4 IMPLANT
ELECT BLADE 4.0 EZ CLEAN MEGAD (MISCELLANEOUS) ×4
ELECT BLADE 6.5 EXT (BLADE) ×4 IMPLANT
ELECT CAUTERY BLADE 6.4 (BLADE) ×4 IMPLANT
ELECT PAD GROUND ADT 9 (MISCELLANEOUS) ×8 IMPLANT
ELECT REM PT RETURN 9FT ADLT (ELECTROSURGICAL) ×4
ELECTRODE BLDE 4.0 EZ CLN MEGD (MISCELLANEOUS) ×2 IMPLANT
ELECTRODE REM PT RTRN 9FT ADLT (ELECTROSURGICAL) ×2 IMPLANT
FELT TEFLON 6X6 (MISCELLANEOUS) ×4 IMPLANT
GLOVE BIO SURGEON STRL SZ 6 (GLOVE) ×6 IMPLANT
GLOVE BIO SURGEON STRL SZ 6.5 (GLOVE) ×7 IMPLANT
GLOVE BIO SURGEON STRL SZ7.5 (GLOVE) ×6 IMPLANT
GLOVE BIO SURGEONS STRL SZ 6.5 (GLOVE) ×7
GLOVE BIOGEL M 6.5 STRL (GLOVE) ×4 IMPLANT
GLOVE BIOGEL PI IND STRL 6 (GLOVE) IMPLANT
GLOVE BIOGEL PI IND STRL 7.0 (GLOVE) IMPLANT
GLOVE BIOGEL PI INDICATOR 6 (GLOVE) ×6
GLOVE BIOGEL PI INDICATOR 7.0 (GLOVE) ×10
GLOVE BIOGEL PI ORTHO PRO 7.5 (GLOVE) ×4
GLOVE PI ORTHO PRO STRL 7.5 (GLOVE) ×4 IMPLANT
GOWN PREVENTION PLUS XLARGE (GOWN DISPOSABLE) ×8 IMPLANT
GOWN STRL NON-REIN LRG LVL3 (GOWN DISPOSABLE) ×20 IMPLANT
HEMOSTAT POWDER SURGIFOAM 1G (HEMOSTASIS) ×18 IMPLANT
HEMOSTAT SURGICEL 2X14 (HEMOSTASIS) ×2 IMPLANT
HM II SYSTEM CONTROLLER ×2 IMPLANT
HM II VENTR. ASSIST DEVICE BP (Prosthesis & Implant Heart) ×4 IMPLANT
INSERT FOGARTY XLG (MISCELLANEOUS) ×4 IMPLANT
KIT BASIN OR (CUSTOM PROCEDURE TRAY) ×4 IMPLANT
KIT PAIN CUSTOM (MISCELLANEOUS) IMPLANT
KIT ROOM TURNOVER OR (KITS) ×4 IMPLANT
KIT SUCTION CATH 14FR (SUCTIONS) ×4 IMPLANT
LEAD PACING MYOCARDI (MISCELLANEOUS) ×4 IMPLANT
LINE VENT (MISCELLANEOUS) ×2 IMPLANT
NDL SUT 4 .5 CRC FRENCH EYE (NEEDLE) IMPLANT
NEEDLE FRENCH EYE (NEEDLE) ×4
NS IRRIG 1000ML POUR BTL (IV SOLUTION) ×22 IMPLANT
PACK OPEN HEART (CUSTOM PROCEDURE TRAY) ×4 IMPLANT
PAD ARMBOARD 7.5X6 YLW CONV (MISCELLANEOUS) ×8 IMPLANT
PAD DEFIB R2 (MISCELLANEOUS) ×4 IMPLANT
PUNCH AORTIC ROTATE 4.5MM 8IN (MISCELLANEOUS) ×4 IMPLANT
SEALANT SURG COSEAL 8ML (VASCULAR PRODUCTS) ×6 IMPLANT
SET CARDIOPLEGIA MPS 5001102 (MISCELLANEOUS) ×2 IMPLANT
SHEATH AVANTI 11CM 5FR (MISCELLANEOUS) ×2 IMPLANT
SPONGE GAUZE 4X4 12PLY (GAUZE/BANDAGES/DRESSINGS) ×8 IMPLANT
SPONGE LAP 18X18 X RAY DECT (DISPOSABLE) ×4 IMPLANT
STOPCOCK 4 WAY LG BORE MALE ST (IV SETS) ×4 IMPLANT
SUCKER INTRACARDIAC WEIGHTED (SUCKER) ×4 IMPLANT
SUT ETHIBOND 2 0 SH (SUTURE) ×20
SUT ETHIBOND 2 0 SH 36X2 (SUTURE) ×10 IMPLANT
SUT ETHIBOND 5 LR DA (SUTURE) ×12 IMPLANT
SUT ETHIBOND NAB MH 2-0 36IN (SUTURE) ×60 IMPLANT
SUT PDS AB 1 CTX 36 (SUTURE) ×16 IMPLANT
SUT PDS AB 3-0 SH 27 (SUTURE) ×12 IMPLANT
SUT PROLENE 3 0 SH DA (SUTURE) ×8 IMPLANT
SUT PROLENE 4 0 RB 1 (SUTURE) ×16
SUT PROLENE 4 0 SH DA (SUTURE) ×12 IMPLANT
SUT PROLENE 4-0 RB1 .5 CRCL 36 (SUTURE) ×4 IMPLANT
SUT PROLENE BLUE 7 0 (SUTURE) ×2 IMPLANT
SUT SILK  1 MH (SUTURE) ×10
SUT SILK 1 MH (SUTURE) ×6 IMPLANT
SUT SILK 1 TIES 10X30 (SUTURE) ×4 IMPLANT
SUT SILK 2 0 SH CR/8 (SUTURE) ×12 IMPLANT
SUT STEEL 6MS V (SUTURE) ×8 IMPLANT
SUT STEEL SZ 6 DBL 3X14 BALL (SUTURE) ×4 IMPLANT
SUT TEM PAC WIRE 2 0 SH (SUTURE) ×4 IMPLANT
SUT VIC AB 1 CTX 18 (SUTURE) ×4 IMPLANT
SUT VIC AB 1 CTX 36 (SUTURE) ×16
SUT VIC AB 1 CTX36XBRD ANBCTR (SUTURE) ×4 IMPLANT
SUT VIC AB 2 TP1 27 (SUTURE) ×2 IMPLANT
SUT VIC AB 2-0 CTX 27 (SUTURE) ×8 IMPLANT
SUT VIC AB 3-0 SH 8-18 (SUTURE) ×4 IMPLANT
SUT VIC AB 3-0 X1 27 (SUTURE) ×10 IMPLANT
SUTURE E-PAK OPEN HEART (SUTURE) IMPLANT
SYR 50ML LL SCALE MARK (SYRINGE) ×4 IMPLANT
SYSTEM SAHARA CHEST DRAIN ATS (WOUND CARE) ×4 IMPLANT
TOWEL OR 17X26 10 PK STRL BLUE (TOWEL DISPOSABLE) ×12 IMPLANT
TRAY CATH LUMEN 1 20CM STRL (SET/KITS/TRAYS/PACK) ×4 IMPLANT
TRAY FOLEY IC TEMP SENS 14FR (CATHETERS) ×4 IMPLANT
TUBE SUCT INTRACARD DLP 20F (MISCELLANEOUS) ×4 IMPLANT
UNDERPAD 30X30 INCONTINENT (UNDERPADS AND DIAPERS) ×4 IMPLANT
VENT LEFT HEART 12002 (CATHETERS)
WATER STERILE IRR 1000ML POUR (IV SOLUTION) ×8 IMPLANT

## 2013-01-12 NOTE — Brief Op Note (Signed)
01/10/2013 - 01/12/2013  6:32 PM  PATIENT:  [name]  72 y.o. male  PRE-OPERATIVE DIAGNOSIS:  ischemic cardiomyopathy  POST-OPERATIVE DIAGNOSIS:  ischemic cardiomyopathy  PROCEDURE:  Procedure(s) with comments: INSERTION OF IMPLANTABLE LEFT VENTRICULAR ASSIST DEVICE (N/A) -  nitric oxide; Redo sternotomy INTRAOPERATIVE TRANSESOPHAGEAL ECHOCARDIOGRAM (N/A)  SURGEON:  Surgeon(s) and Role:    * Kerin Perna, MD - Primary    * Delight Ovens, MD - Assisting  PHYSICIAN ASSISTANT: 0  ASSISTANTS:EBG,M IRwin RNFA  ANESTHESIA:   general  EBL:  Total I/O In: 8403.8 [I.V.:5062.8; Blood:2261; NG/GT:30; IV Piggyback:1050] Out: 3230 [Urine:1350; Emesis/NG output:100; Blood:1500; Chest Tube:280]  BLOOD ADMINISTERED:2 units PLTS  DRAINS1 MT 1 R PT 1 pumppocket drain  LOCAL MEDICATIONS USED:  NONE  SPECIMEN:  Source of Specimen:  LV apical plug  DISPOSITION OF SPECIMEN:  path  COUNTS:  YES  TOURNIQUET:  * No tourniquets in log *  DICTATION: .Dragon Dictation  PLAN OF CARE: Admit to inpatient   PATIENT DISPOSITION:  ICU - intubated and critically ill.   Delay start of Pharmacological VTE agent (>24hrs) due to surgical blood loss or risk of bleeding: yes

## 2013-01-12 NOTE — Progress Notes (Signed)
Methemoglobin at 2100 was 1.6. Value did not import. Thresa Ross RN

## 2013-01-12 NOTE — Progress Notes (Signed)
The patient was examined and preop studies reviewed. There has been no change from the prior exam and the patient is ready for surgery.  Plan LVAD placement on R Kimrey today

## 2013-01-12 NOTE — Anesthesia Procedure Notes (Addendum)
Procedure Name: Intubation Date/Time: 01/12/2013 8:03 AM Performed by: Ellin Goodie Pre-anesthesia Checklist: Patient identified, Emergency Drugs available, Suction available, Patient being monitored and Timeout performed Patient Re-evaluated:Patient Re-evaluated prior to inductionOxygen Delivery Method: Circle system utilized Preoxygenation: Pre-oxygenation with 100% oxygen Intubation Type: IV induction Ventilation: Mask ventilation without difficulty Laryngoscope Size: Mac and 4 Grade View: Grade II Tube type: Oral Tube size: 8.0 mm Number of attempts: 1 Airway Equipment and Method: Stylet Placement Confirmation: ETT inserted through vocal cords under direct vision,  positive ETCO2 and breath sounds checked- equal and bilateral Secured at: 23 cm Tube secured with: Tape Dental Injury: Teeth and Oropharynx as per pre-operative assessment     Performed by: Ellin Goodie Comments: Intubation by Greig Right, CRNA

## 2013-01-12 NOTE — Progress Notes (Signed)
  Echocardiogram Echocardiogram Transesophageal has been performed.  Jorje Guild 01/12/2013, 8:39 AM

## 2013-01-12 NOTE — Progress Notes (Signed)
S/p redo sternotomy, LVAD placement  BP 80/76  Pulse 110  Temp(Src) 99.5 F (37.5 C) (Core (Comment))  Resp 14  Ht 6' (1.829 m)  Wt 225 lb 1.4 oz (102.1 kg)  BMI 30.52 kg/m2  SpO2 96%   Intake/Output Summary (Last 24 hours) at 01/12/13 1922 Last data filed at 01/12/13 1900  Gross per 24 hour  Intake 8981.4 ml  Output   3520 ml  Net 5461.4 ml    LVAD functioning well  Receiving PRBC

## 2013-01-12 NOTE — OR Nursing (Signed)
14:45pm - 2nd call to SICU.

## 2013-01-12 NOTE — Progress Notes (Signed)
This NP was made aware of patient one day prior to surgery, unable to logistically complete Preparedness Plan.  Will continue to follow and when patient is strong enough/cognitively aware  will meet with patient and family for this supportive intervention.  Lorinda Creed NP  Palliative Medicine Team Team Phone # [phone] Pager [phone]

## 2013-01-12 NOTE — Transfer of Care (Signed)
Immediate Anesthesia Transfer of Care Note  Patient: [name]  Procedure(s) Performed: Procedure(s) with comments: INSERTION OF IMPLANTABLE LEFT VENTRICULAR ASSIST DEVICE (N/A) -  nitric oxide; Redo sternotomy INTRAOPERATIVE TRANSESOPHAGEAL ECHOCARDIOGRAM (N/A)  Patient Location: ICU  Anesthesia Type:General  Level of Consciousness: sedated and Patient remains intubated per anesthesia plan  Airway & Oxygen Therapy: Patient remains intubated per anesthesia plan and Patient placed on Ventilator (see vital sign flow sheet for setting)  Post-op Assessment: Report given to PACU RN  Post vital signs: Reviewed and stable  Complications: No apparent anesthesia complications

## 2013-01-12 NOTE — Anesthesia Preprocedure Evaluation (Addendum)
Anesthesia Evaluation  Patient identified by MRN, date of birth, ID band Patient awake    Reviewed: Allergy & Precautions, H&P , NPO status , Patient's Chart, lab work & pertinent test results, reviewed documented beta blocker date and time   Airway Mallampati: II TM Distance: >3 FB Neck ROM: Limited    Dental  (+) Edentulous Upper and Edentulous Lower   Pulmonary shortness of breath and at rest, sleep apnea , COPD COPD inhaler, former smoker,  breath sounds clear to auscultation  Pulmonary exam normal       Cardiovascular + CAD and +CHF + dysrhythmias Atrial Fibrillation + pacemaker + Cardiac Defibrillator Rhythm:Irregular Rate:Normal     Neuro/Psych    GI/Hepatic   Endo/Other  Hypothyroidism   Renal/GU      Musculoskeletal   Abdominal Normal abdominal exam  (+)   Peds  Hematology  (+) Blood dyscrasia, anemia ,   Anesthesia Other Findings Medtronic AICD  Reproductive/Obstetrics                          Anesthesia Physical Anesthesia Plan  ASA: IV  Anesthesia Plan: General   Post-op Pain Management:    Induction: Intravenous  Airway Management Planned: Oral ETT  Additional Equipment: Arterial line, Ultrasound Guidance Line Placement, CVP, PA Cath and 3D TEE  Intra-op Plan:   Post-operative Plan: Post-operative intubation/ventilation  Informed Consent:   Dental advisory given  Plan Discussed with: CRNA, Anesthesiologist and Surgeon  Anesthesia Plan Comments: (Ischemic cardiomyopathy with Class 4 CHF CAD S/P CABG 2003 AICD, medtronic now off COPD H/O smoking quit 2003 Chronic atrial fibrillation  Plan GA  Kipp Brood)       Anesthesia Quick Evaluation

## 2013-01-12 NOTE — OR Nursing (Signed)
13:45pm - 1st call to SICU.  Bynum Bellows updated family

## 2013-01-12 NOTE — CV Procedure (Signed)
Intra-operative Transesophageal Echocardiography Report:   [name] is a 72 year old male with a history of severe ischemic cardiomyopathy who scheduled to undergo insertion of a Heartmate II left ventricular assist device by Dr. Kathlee Nations Trigt. The patient underwent coronary artery bypass grafting in 2003 and has had chronic systolic congestive heart failure. An AICD was placed in 2003 as well. He was recently admitted with class IV congestive heart failure symptoms. The LV ejection fraction was 25-30% by preoperative echocardiography. Intraoperative transesophageal echocardiography was requested to evaluate the right and left ventricular function and to assist with the procedure, assess cannula placement and to help determine optimal pump speed.  The patient was brought to the operating room at [facility] and general anesthesia was induced without difficulty. Following endotracheal intubation and orogastric suctioning, the transesophageal echocardiography probe was inserted into the esophagus without difficulty.   Impression: Pre-bypass findings:  1. Aortic valve: The aortic valve was trileaflet. There was moderate thickening of the aortic leaflets and there was prominent calcification noted on the left coronary cusp along the coaptation line. However the aortic valve appeared to open normally and there was no aortic insufficiency.  2. Mitral valve: There was moderate mitral insufficiency. The posterior leaflet appeared somewhat restricted and the anterior leaflet  appeared to override the posterior leaflet producing jet of mitral insufficiency which was posteriorly directed. There was a wall hugging along the posterior wall of the left atrium. The vena contracta measured 0.4 cm. There was moderate mitral annular calcification at the bases of the posterior and anterior leaflets. There was moderate calcification of the posterior medial papillary muscles well.  3. Left ventricle: There  was severe left ventricular dysfunction. The mid and distal anterior wall, apex, and mid to distal anterior septum were akinetic and thinned. There was some contractility of the anterior wall in the basilar region and the anterior septum in the basilar region. There appeared to be normal contractility of the lateral wall and of the basal to mid posterior wall. Left ventricular ejection fraction was estimated 25%. There was no thrombus noted in the left ventricular apex. Left ventricular end-diastolic diameter measured 6.1 cm at the mid-papillary level of the in the transgastric short axis view.  4. Right ventricle: There is mild right ventricular dysfunction. Right ventricular size was within normal limits. There was mild hypokinesis of the right ventricular free wall.  5. Tricuspid valve: The tricuspid valve appeared structurally intact with trace tricuspid insufficiency.  6. Interatrial septum: The interatrial septum was carefully interrogated with color Doppler and multiple bubble studies using Valsalva and agitated albumin solution. There was no evidence of a patent foramen ovale or atrial septal defect present.  7. Left atrium: The left atrial cavity was markedly enlarged and measured 5.4 cm in the mediolateral dimension 5.9 cm in the superior inferior dimension. There was no thrombus noted in the left atrium or left atrial appendage.  8. Ascending aorta: The  ascending aorta showed a well-defined aortic root and sinotubular ridge. There was moderate thickening of the wall the Ascending aorta with some calcification noted but no severe atheromatous disease appreciated.  9. Descending aorta the descending aorta measured 2.2 cm in diameter and there was mild thickening of the wall of the  descending aorta present.  Post-bypass findings:  1. Aortic valve: The aortic valve again showed moderate thickening of the leaflets but they appeared to open normally. There was calcification  along the  coaptation line of the left coronary cusp. With increasing pump  flow the aortic valve appeared to open approximately every third to fourth beat. This was present when the transesophageal echocardiography probe was removed  2. Mitral valve: The mitral valve appeared structurally unchanged the pre-bypass study. There was improvement in the mitral regurgitation which was graded as mild to moderate.   3. Left ventricle: There was a decrease in size of the left ventricular cavity. With increasing pump flow the interventricular septum moved leftward and appeared midline. The inflow cannula of the LVAD was well-positioned and directed toward the mitral valve and was not abutting the interventricular septum. There was laminar flow within the inflow cannula. There was no significant air noted within the left ventricular cavity. There remained persistent akinesis of the mid-to distal anterior wall, apex, and mid-to distal anterior septum which was unchanged from the pre-bypass study.  4. Right ventricle: The right ventricular cavity appeared to be within normal limits of size. There appeared to be the adequate contractility the right ventricular free wall and no dilatation of the right ventricular cavity appeared.  5. Interatrial septum: Interatrial septum was interrogated using color Doppler and there was no evidence of right-to-left shunt.  6. Left atrium: No significant air was appreciated within the left atrial cavity.  7. Ascending aorta: The outflow cannula location could be visualized in the ascending aorta anteriorly above the sinotubular junction. There appeared to be laminar flow at into the descending aorta without turbulence.  Kipp Brood M.D.

## 2013-01-12 NOTE — Anesthesia Postprocedure Evaluation (Signed)
  Anesthesia Post-op Note  Patient: [name]  Procedure(s) Performed: Procedure(s) with comments: INSERTION OF IMPLANTABLE LEFT VENTRICULAR ASSIST DEVICE (N/A) -  nitric oxide; Redo sternotomy INTRAOPERATIVE TRANSESOPHAGEAL ECHOCARDIOGRAM (N/A)  Patient Location: PACU  Anesthesia Type:General  Level of Consciousness: Sedated on ventilator  Airway and Oxygen Therapy: On ventilator per anesthesia plan Post-op Pain: none  Post-op Assessment: Post-op Vital signs reviewed and Patient's Cardiovascular Status Stable  Post-op Vital Signs: stable  Complications: No apparent anesthesia complications

## 2013-01-13 ENCOUNTER — Inpatient Hospital Stay (HOSPITAL_COMMUNITY): Payer: Medicare Other

## 2013-01-13 ENCOUNTER — Encounter (HOSPITAL_COMMUNITY): Payer: Self-pay | Admitting: Cardiothoracic Surgery

## 2013-01-13 DIAGNOSIS — T8111XA Postprocedural  cardiogenic shock, initial encounter: Secondary | ICD-10-CM

## 2013-01-13 DIAGNOSIS — J9690 Respiratory failure, unspecified, unspecified whether with hypoxia or hypercapnia: Secondary | ICD-10-CM

## 2013-01-13 DIAGNOSIS — Z95811 Presence of heart assist device: Secondary | ICD-10-CM

## 2013-01-13 DIAGNOSIS — J9600 Acute respiratory failure, unspecified whether with hypoxia or hypercapnia: Secondary | ICD-10-CM

## 2013-01-13 DIAGNOSIS — I5022 Chronic systolic (congestive) heart failure: Secondary | ICD-10-CM

## 2013-01-13 LAB — PREPARE FRESH FROZEN PLASMA
Unit division: 0
Unit division: 0
Unit division: 0
Unit division: 0

## 2013-01-13 LAB — GLUCOSE, CAPILLARY
Glucose-Capillary: 104 mg/dL — ABNORMAL HIGH (ref 70–99)
Glucose-Capillary: 113 mg/dL — ABNORMAL HIGH (ref 70–99)
Glucose-Capillary: 114 mg/dL — ABNORMAL HIGH (ref 70–99)
Glucose-Capillary: 117 mg/dL — ABNORMAL HIGH (ref 70–99)
Glucose-Capillary: 118 mg/dL — ABNORMAL HIGH (ref 70–99)
Glucose-Capillary: 118 mg/dL — ABNORMAL HIGH (ref 70–99)
Glucose-Capillary: 119 mg/dL — ABNORMAL HIGH (ref 70–99)
Glucose-Capillary: 120 mg/dL — ABNORMAL HIGH (ref 70–99)
Glucose-Capillary: 121 mg/dL — ABNORMAL HIGH (ref 70–99)
Glucose-Capillary: 121 mg/dL — ABNORMAL HIGH (ref 70–99)
Glucose-Capillary: 125 mg/dL — ABNORMAL HIGH (ref 70–99)
Glucose-Capillary: 126 mg/dL — ABNORMAL HIGH (ref 70–99)
Glucose-Capillary: 129 mg/dL — ABNORMAL HIGH (ref 70–99)
Glucose-Capillary: 130 mg/dL — ABNORMAL HIGH (ref 70–99)
Glucose-Capillary: 133 mg/dL — ABNORMAL HIGH (ref 70–99)
Glucose-Capillary: 133 mg/dL — ABNORMAL HIGH (ref 70–99)
Glucose-Capillary: 134 mg/dL — ABNORMAL HIGH (ref 70–99)
Glucose-Capillary: 134 mg/dL — ABNORMAL HIGH (ref 70–99)
Glucose-Capillary: 136 mg/dL — ABNORMAL HIGH (ref 70–99)
Glucose-Capillary: 139 mg/dL — ABNORMAL HIGH (ref 70–99)
Glucose-Capillary: 150 mg/dL — ABNORMAL HIGH (ref 70–99)
Glucose-Capillary: 155 mg/dL — ABNORMAL HIGH (ref 70–99)
Glucose-Capillary: 156 mg/dL — ABNORMAL HIGH (ref 70–99)

## 2013-01-13 LAB — CARBOXYHEMOGLOBIN
Carboxyhemoglobin: 2.2 % — ABNORMAL HIGH (ref 0.5–1.5)
Methemoglobin: 1 % (ref 0.0–1.5)
Methemoglobin: 0.8 % (ref 0.0–1.5)
O2 Saturation: 72.4 %
O2 Saturation: 64.5 %
Total hemoglobin: 8.2 g/dL — ABNORMAL LOW (ref 13.5–18.0)
Total hemoglobin: 8 g/dL — ABNORMAL LOW (ref 13.5–18.0)

## 2013-01-13 LAB — BASIC METABOLIC PANEL
BUN: 11 mg/dL (ref 6–23)
CO2: 25 mEq/L (ref 19–32)
Calcium: 8.7 mg/dL (ref 8.4–10.5)
Chloride: 107 mEq/L (ref 96–112)
Creatinine, Ser: 0.74 mg/dL (ref 0.50–1.35)
Glucose, Bld: 133 mg/dL — ABNORMAL HIGH (ref 70–99)

## 2013-01-13 LAB — CBC WITH DIFFERENTIAL/PLATELET
Basophils Absolute: 0 10*3/uL (ref 0.0–0.1)
Eosinophils Absolute: 0.3 10*3/uL (ref 0.0–0.7)
Eosinophils Relative: 3 % (ref 0–5)
HCT: 25.2 % — ABNORMAL LOW (ref 39.0–52.0)
Lymphocytes Relative: 9 % — ABNORMAL LOW (ref 12–46)
MCH: 26.6 pg (ref 26.0–34.0)
MCV: 79 fL (ref 78.0–100.0)
Monocytes Absolute: 1.3 10*3/uL — ABNORMAL HIGH (ref 0.1–1.0)
Platelets: 126 10*3/uL — ABNORMAL LOW (ref 150–400)
RDW: 15.6 % — ABNORMAL HIGH (ref 11.5–15.5)
WBC: 11.7 10*3/uL — ABNORMAL HIGH (ref 4.0–10.5)

## 2013-01-13 LAB — POCT I-STAT 3, ART BLOOD GAS (G3+)
Acid-base deficit: 1 mmol/L (ref 0.0–2.0)
Acid-base deficit: 1 mmol/L (ref 0.0–2.0)
Bicarbonate: 25.8 mEq/L — ABNORMAL HIGH (ref 20.0–24.0)
Bicarbonate: 24.9 mEq/L — ABNORMAL HIGH (ref 20.0–24.0)
Bicarbonate: 22.6 mEq/L (ref 20.0–24.0)
Bicarbonate: 22.6 mEq/L (ref 20.0–24.0)
O2 Saturation: 100 %
O2 Saturation: 99 %
O2 Saturation: 92 %
O2 Saturation: 97 %
Patient temperature: 37.9
Patient temperature: 38
Patient temperature: 37.8
TCO2: 27 mmol/L (ref 0–100)
TCO2: 26 mmol/L (ref 0–100)
TCO2: 24 mmol/L (ref 0–100)
TCO2: 24 mmol/L (ref 0–100)
pCO2 arterial: 46.5 mmHg — ABNORMAL HIGH (ref 35.0–45.0)
pCO2 arterial: 42.2 mmHg (ref 35.0–45.0)
pCO2 arterial: 33.6 mmHg — ABNORMAL LOW (ref 35.0–45.0)
pCO2 arterial: 35.1 mmHg (ref 35.0–45.0)
pH, Arterial: 7.353 (ref 7.350–7.450)
pH, Arterial: 7.383 (ref 7.350–7.450)
pH, Arterial: 7.44 (ref 7.350–7.450)
pH, Arterial: 7.42 (ref 7.350–7.450)
pO2, Arterial: 454 mmHg — ABNORMAL HIGH (ref 80.0–100.0)
pO2, Arterial: 145 mmHg — ABNORMAL HIGH (ref 80.0–100.0)
pO2, Arterial: 64 mmHg — ABNORMAL LOW (ref 80.0–100.0)
pO2, Arterial: 94 mmHg (ref 80.0–100.0)

## 2013-01-13 LAB — FACTOR 5 LEIDEN

## 2013-01-13 LAB — BLOOD GAS, ARTERIAL
Drawn by: 331761
O2 Saturation: 99.5 %
PEEP: 5 cmH2O
RATE: 14 resp/min
pCO2 arterial: 39.1 mmHg (ref 35.0–45.0)
pH, Arterial: 7.41 (ref 7.350–7.450)
pO2, Arterial: 148 mmHg — ABNORMAL HIGH (ref 80.0–100.0)

## 2013-01-13 LAB — PREPARE PLATELET PHERESIS: Unit division: 0

## 2013-01-13 LAB — CREATININE, SERUM
Creatinine, Ser: 0.74 mg/dL (ref 0.50–1.35)
GFR calc non Af Amer: >90 mL/min (ref >90)

## 2013-01-13 LAB — CBC
Hemoglobin: 8.4 g/dL — ABNORMAL LOW (ref 13.0–17.0)
RBC: 3.21 MIL/uL — ABNORMAL LOW (ref 4.22–5.81)
WBC: 13.8 10*3/uL — ABNORMAL HIGH (ref 4.0–10.5)

## 2013-01-13 LAB — POCT I-STAT 4, (NA,K, GLUC, HGB,HCT)
Glucose, Bld: 117 mg/dL — ABNORMAL HIGH (ref 70–99)
HCT: 28 % — ABNORMAL LOW (ref 39.0–52.0)
Hemoglobin: 9.5 g/dL — ABNORMAL LOW (ref 13.0–17.0)
Potassium: 4 mEq/L (ref 3.5–5.1)
Sodium: 139 mEq/L (ref 135–145)

## 2013-01-13 LAB — BILIRUBIN, TOTAL: Total Bilirubin: 1.9 mg/dL — ABNORMAL HIGH (ref 0.3–1.2)

## 2013-01-13 LAB — POCT I-STAT, CHEM 8
BUN: 7 mg/dL (ref 6–23)
Calcium, Ion: 1.29 mmol/L (ref 1.13–1.30)
Chloride: 99 mEq/L (ref 96–112)
Creatinine, Ser: 0.7 mg/dL (ref 0.50–1.35)
Glucose, Bld: 165 mg/dL — ABNORMAL HIGH (ref 70–99)
HCT: 27 % — ABNORMAL LOW (ref 39.0–52.0)
Hemoglobin: 9.2 g/dL — ABNORMAL LOW (ref 13.0–17.0)
Potassium: 3.9 mEq/L (ref 3.5–5.1)
Sodium: 133 mEq/L — ABNORMAL LOW (ref 135–145)
TCO2: 23 mmol/L (ref 0–100)

## 2013-01-13 LAB — CALCIUM, IONIZED: Calcium, Ion: 1.15 mmol/L (ref 1.13–1.30)

## 2013-01-13 LAB — PRO B NATRIURETIC PEPTIDE: Pro B Natriuretic peptide (BNP): 1080 pg/mL — ABNORMAL HIGH (ref 0–125)

## 2013-01-13 LAB — ALT: ALT: 10 U/L (ref 0–53)

## 2013-01-13 LAB — MAGNESIUM
Magnesium: 2.3 mg/dL (ref 1.5–2.5)
Magnesium: 1.9 mg/dL (ref 1.5–2.5)

## 2013-01-13 LAB — LACTATE DEHYDROGENASE: LDH: 393 U/L — ABNORMAL HIGH (ref 94–250)

## 2013-01-13 MED ORDER — METOPROLOL TARTRATE 1 MG/ML IV SOLN
2.5000 mg | Freq: Three times a day (TID) | INTRAVENOUS | Status: DC
  Administered 2013-01-13: 2.5 mg via INTRAVENOUS

## 2013-01-13 MED ORDER — METOPROLOL TARTRATE 12.5 MG HALF TABLET
12.5000 mg | ORAL_TABLET | Freq: Two times a day (BID) | ORAL | Status: DC
  Administered 2013-01-14: 12.5 mg via ORAL
  Filled 2013-01-13 (×4): qty 1

## 2013-01-13 MED ORDER — POTASSIUM PHOSPHATE DIBASIC 3 MMOLE/ML IV SOLN
30.0000 mmol | Freq: Once | INTRAVENOUS | Status: AC
  Administered 2013-01-13: 30 mmol via INTRAVENOUS
  Filled 2013-01-13: qty 10

## 2013-01-13 MED ORDER — INSULIN GLARGINE 100 UNIT/ML ~~LOC~~ SOLN
12.0000 [IU] | Freq: Two times a day (BID) | SUBCUTANEOUS | Status: DC
  Administered 2013-01-13 – 2013-01-16 (×8): 12 [IU] via SUBCUTANEOUS
  Filled 2013-01-13 (×10): qty 0.12

## 2013-01-13 MED ORDER — ALBUMIN HUMAN 25 % IV SOLN: 12.5000 g | Freq: Once | INTRAVENOUS | Status: DC

## 2013-01-13 MED ORDER — BIOTENE DRY MOUTH MT LIQD
15.0000 mL | Freq: Two times a day (BID) | OROMUCOSAL | Status: DC
  Administered 2013-01-13 – 2013-01-17 (×8): 15 mL via OROMUCOSAL

## 2013-01-13 MED ORDER — ALBUMIN HUMAN 5 % IV SOLN
12.5000 g | Freq: Once | INTRAVENOUS | Status: AC
  Administered 2013-01-13: 12.5 g via INTRAVENOUS

## 2013-01-13 MED ORDER — FUROSEMIDE 10 MG/ML IJ SOLN
7.0000 mg/h | INTRAVENOUS | Status: DC
  Administered 2013-01-13: 7 mg/h via INTRAVENOUS
  Filled 2013-01-13: qty 25

## 2013-01-13 MED ORDER — WARFARIN - PHARMACIST DOSING INPATIENT
Freq: Every day | Status: DC
  Administered 2013-01-17 – 2013-01-21 (×3)

## 2013-01-13 MED ORDER — WARFARIN SODIUM 2.5 MG PO TABS
2.5000 mg | ORAL_TABLET | Freq: Once | ORAL | Status: AC
  Administered 2013-01-13: 2.5 mg via ORAL
  Filled 2013-01-13: qty 1

## 2013-01-13 MED ORDER — DIGOXIN 125 MCG PO TABS
0.1250 mg | ORAL_TABLET | Freq: Every day | ORAL | Status: DC
  Administered 2013-01-14: 0.125 mg via ORAL
  Filled 2013-01-13 (×2): qty 1

## 2013-01-13 MED ORDER — INSULIN ASPART 100 UNIT/ML ~~LOC~~ SOLN
0.0000 [IU] | SUBCUTANEOUS | Status: DC
  Administered 2013-01-13 – 2013-01-20 (×13): 2 [IU] via SUBCUTANEOUS

## 2013-01-13 MED ORDER — INSULIN ASPART 100 UNIT/ML ~~LOC~~ SOLN
0.0000 [IU] | SUBCUTANEOUS | Status: AC
  Administered 2013-01-13 (×2): 2 [IU] via SUBCUTANEOUS

## 2013-01-13 MED ORDER — INSULIN GLARGINE 100 UNIT/ML ~~LOC~~ SOLN
18.0000 [IU] | Freq: Every day | SUBCUTANEOUS | Status: DC
  Filled 2013-01-13: qty 0.18

## 2013-01-13 MED FILL — Sodium Chloride IV Soln 0.9%: INTRAVENOUS | Qty: 1000 | Status: AC

## 2013-01-13 MED FILL — Heparin Sodium (Porcine) Inj 1000 Unit/ML: INTRAMUSCULAR | Qty: 30 | Status: AC

## 2013-01-13 MED FILL — Heparin Sodium (Porcine) Inj 1000 Unit/ML: INTRAMUSCULAR | Qty: 20 | Status: AC

## 2013-01-13 MED FILL — Sodium Chloride Irrigation Soln 0.9%: Qty: 3000 | Status: AC

## 2013-01-13 MED FILL — Sodium Bicarbonate IV Soln 8.4%: INTRAVENOUS | Qty: 50 | Status: AC

## 2013-01-13 MED FILL — Mannitol IV Soln 20%: INTRAVENOUS | Qty: 500 | Status: AC

## 2013-01-13 MED FILL — Electrolyte-R (PH 7.4) Solution: INTRAVENOUS | Qty: 4000 | Status: AC

## 2013-01-13 NOTE — Progress Notes (Signed)
HeartMate 2 Rounding Note  Subjective:    [name] is a 72 year old gentleman with a history of CABG (2003), Chronic systolic HF EF 32%, S/P AICD (generator change 2011), hyperlipidemia, hypothyroidism, emphysema, OSA, intermittent atrial fibrillation (on coumadin). Quit smoking 2003. Uses CPAP and O2 every night. He is a disabled [employer]. He was referred to the AHF clinic by Dr. Elease Hashimoto and over the past couple of months his functional capacity has really declined. He has class IV heart failure symptoms and has not improved on OMM. His CPX in 11/13 showed a VO2 of 9.2%, VE/VCO2- 52.8, OUES 1.15 and RER= 1.02. We have been following him closely and have had multiple discussions about his options and the patient decided that he wanted to proceed with LVAD placement.   Admitted 01/10/13 for optimization before LVAD implant. HMII VAD implant 4/15  POD#1 Patient stable overnight. Weaning NO this am and will try to extubate later this morning. MAPs 70-80's. Remains on Levophed, milrinone, low dose dopamine, and amio. Weight up 19 lbs. Pocket drain 310 cc out overnight. Pulmonary helping with pulmonary toilet. Remains in AF with RVR  PA 47/26 (33) CVP 13 Co-ox 70 INR 1.4  LVAD INTERROGATION:  HeartMate II LVAD:  Flow 5.1 liters/min, speed 9000, power 5.1, PI 5.3.    Objective:    Vital Signs:   Temp:  [98.4 F (36.9 C)-100.6 F (38.1 C)] 100.2 F (37.9 C) (04/16 1038) Pulse Rate:  [25-139] 111 (04/16 1038) Resp:  [9-28] 20 (04/16 1038) BP: (72-82)/(65-76) 76/70 mmHg (04/16 1038) SpO2:  [94 %-100 %] 99 % (04/16 1038) Arterial Line BP: (66-88)/(62-77) 77/70 mmHg (04/16 1030) FiO2 (%):  [40 %-50 %] 40 % (04/16 1038) Weight:  [102.1 kg (225 lb 1.4 oz)-111 kg (244 lb 11.4 oz)] 111 kg (244 lb 11.4 oz) (04/16 0600) Last BM Date: 01/10/13 Mean arterial Pressure 70-80's  Intake/Output:   Intake/Output Summary (Last 24 hours) at 01/13/13 1100 Last data filed at 01/13/13 1000  Gross per 24  hour  Intake 11118.81 ml  Output   4570 ml  Net 6548.81 ml     Physical Exam: General: Intubated HEENT: normal Neck: supple. CVP 13 . Carotids 2+ bilat; no bruits. No lymphadenopathy or thryomegaly appreciated. Cor: Mechanical heart sounds with LVAD hum present. Lungs: diminished in the bases Abdomen: soft, nontender, mild distention. No hepatosplenomegaly. No bruits or masses. no bowel sounds. Driveline: Dressing C/D/I and securement device intact Extremities: no cyanosis, clubbing, rash, tr edema Neuro: alert & orientedx3, cranial nerves grossly intact. moves all 4 extremities w/o difficulty. Sedated  Telemetry: Afib 90s-low 100s  Labs: Basic Metabolic Panel:  Recent Labs Lab 01/10/13 1048 01/11/13 0340 01/12/13 0430  01/12/13 1336 01/12/13 1519 01/12/13 1539 01/12/13 2100 01/12/13 2108 01/13/13 0350  NA 139 136 136  < > 139 137 140 138  --  137  K 4.3 4.1 3.9  < > 3.7 3.4* 3.3* 3.5  --  4.4  CL 106 103 102  --   --  103  --  104  --  107  CO2 26 27 28   --   --  27  --   --   --  25  GLUCOSE 109* 133* 120*  < > 166* 139* 136* 152*  --  133*  BUN 16 13 14   --   --  12  --  11  --  11  CREATININE 0.97 0.84 0.79  --   --  0.71  --  0.80 0.76  0.74  CALCIUM 9.7 9.3 9.4  --   --  8.3*  --   --   --  8.7  MG  --   --   --   --   --  1.6  --   --  2.4 2.3  PHOS  --   --   --   --   --   --   --   --   --  1.6*  < > = values in this interval not displayed.  Liver Function Tests:  Recent Labs Lab 01/10/13 1048 01/13/13 0350  AST 20 42*  ALT 9 10  ALKPHOS 45  --   BILITOT 0.6 1.9*  PROT 6.7  --   ALBUMIN 3.3*  --    No results found for this basename: LIPASE, AMYLASE,  in the last 168 hours No results found for this basename: AMMONIA,  in the last 168 hours  CBC:  Recent Labs Lab 01/11/13 0340 01/12/13 0430  01/12/13 1250  01/12/13 1519 01/12/13 1530 01/12/13 1539 01/12/13 2100 01/12/13 2108 01/13/13 0350  WBC 7.0 8.5  --   --   --  10.6*  --   --   --   11.1* 11.7*  NEUTROABS  --   --   --   --   --   --   --   --   --   --  9.1*  HGB 8.8* 9.1*  < > 8.5*  < > 7.6*  --  8.8* 8.2* 8.2* 8.5*  HCT 28.9* 29.5*  < > 26.3*  < > 23.5*  --  26.0* 24.0* 24.6* 25.2*  MCV 81.9 81.5  --   --   --  79.9  --   --   --  79.1 79.0  PLT 130* 135*  --  115*  --  130* 128*  --   --  123* 126*  < > = values in this interval not displayed.  INR:  Recent Labs Lab 01/07/13 1016 01/12/13 1530 01/13/13 0350  INR 1.32 1.38 1.42     Imaging: Dg Chest Port 1 View  01/13/2013  *RADIOLOGY REPORT*  Clinical Data: Ventricular assist device  PORTABLE CHEST - 1 VIEW  Comparison: Portable exam 0630 hours compared to 01/12/2013  Findings: Left ventricular assist device seen at inferior margin of exam. Tip of endotracheal tube approximately 4.9 cm above carina. Nasogastric tube extends into abdomen. Left subclavian AICD lead again noted. Mediastinal drain and question right basilar thoracostomy tube. Left jugular central venous catheter with tip projecting over left brachiocephalic vein near SVC confluence. Left jugular Swan-Ganz catheter tip projecting over main pulmonary artery. Enlargement of cardiac silhouette. Bibasilar atelectasis, cannot exclude consolidation in the left lower lobe. Lungs clear. No pneumothorax.  IMPRESSION: Support devices as above. Enlargement of cardiac silhouette. Bibasilar atelectasis, cannot exclude left lower lobe consolidation. Compared to previous exam, increased opacification is seen in the left lower lobe.   Original Report Authenticated By: Ulyses Southward, M.D.    Dg Chest Port 1 View  01/12/2013  *RADIOLOGY REPORT*  Clinical Data: 72 year old male status post cardiac ventricular assist device placement.  PORTABLE CHEST - 1 VIEW  Comparison: 01/10/2013.  Findings: Portable supine AP view 1545 hours.  Intubated. Endotracheal tube tip in good position between level of clavicles and carina.  Enteric tube courses to the abdomen, tip not included.  2  new left IJ approach central line are in place.  One of these appear to be  a Swan-Ganz catheter which extends to the level of the right atrium and tip at the level of the main pulmonary outflow tract.  A second catheter courses through the inferior cavoatrial junction, tip not included (long arrow).  Right chest tube in place.  A portion of the elbow fat devices visible projecting over the left upper quadrant.  Stable left chest cardiac AICD.  No pneumothorax.  Stable scarring atelectasis at both lung bases. Mild to moderate pulmonary vascular congestion.  Stable cardiac size and mediastinal contours.  Stable sequelae of CABG.  IMPRESSION: 1.  Two left IJ approach central venous catheters, one with its tip at the level of the main pulmonary outflow tract, the second passes through the inferior cavoatrial junction directed into the upper abdomen, tip not included. 2.  Right chest tube in place with no pneumothorax identified.  ET tube and NG tube appear per early place. 3.  Increased pulmonary vascular congestion.  L5 device partially visible.   Original Report Authenticated By: Erskine Speed, M.D.      Medications:     Scheduled Medications: . acetaminophen  1,000 mg Oral Q6H   Or  . acetaminophen (TYLENOL) oral liquid 160 mg/5 mL  975 mg Per Tube Q6H  . antiseptic oral rinse  15 mL Mouth Rinse QID  . aspirin EC  325 mg Oral Daily   Or  . aspirin  324 mg Per Tube Daily   Or  . aspirin  300 mg Rectal Daily  . bisacodyl  10 mg Oral Daily   Or  . bisacodyl  10 mg Rectal Daily  . budesonide-formoterol  2 puff Inhalation BID  . cefUROXime (ZINACEF)  IV  1.5 g Intravenous Q12H  . chlorhexidine  15 mL Mouth Rinse BID  . docusate sodium  200 mg Oral Daily  . insulin glargine  12 Units Subcutaneous BID  . insulin regular  0-10 Units Intravenous TID WC  . levothyroxine  50 mcg Oral QHS  . metoprolol  2.5 mg Intravenous Q8H  . [START ON 01/14/2013] pantoprazole  40 mg Oral Daily  . potassium phosphate  IVPB (mmol)  30 mmol Intravenous Once  . sodium chloride  3 mL Intravenous Q12H  . tiotropium  18 mcg Inhalation Daily  . vancomycin  1,000 mg Intravenous Q12H    Infusions: . sodium chloride    . sodium chloride 20 mL/hr at 01/12/13 1530  . sodium chloride    . amiodarone (NEXTERONE PREMIX) 360 mg/200 mL dextrose    . dexmedetomidine 0.7 mcg/kg/hr (01/13/13 1000)  . DOPamine 2 mcg/kg/min (01/13/13 1000)  . furosemide (LASIX) infusion 7 mg/hr (01/13/13 0900)  . insulin (NOVOLIN-R) infusion 2.2 mL/hr at 01/13/13 1000  . milrinone 0.4 mcg/kg/min (01/13/13 1000)  . nitroGLYCERIN Stopped (01/12/13 1530)  . norepinephrine (LEVOPHED) Adult infusion 8 mcg/min (01/13/13 1000)  . phenylephrine (NEO-SYNEPHRINE) Adult infusion Stopped (01/12/13 1530)    PRN Medications: acetaminophen, albumin human, midazolam, morphine injection, ondansetron (ZOFRAN) IV, oxyCODONE, sodium chloride   Assessment:   1) Acute on chronic HF - class IV  2) Chronic AF on coumadin  3) ICM, EF 20%        S/p LVAD Heartmate II implant 01/12/13 4) CAD  S/p CABG 2003  5) COPD with depressed DLCO  CPAP at night  6) HLD  7) Hypothyroidism   Plan/Discussion:    POD #1 Patient stable overnight and progressing well. Weaning NO and will try to extubate later this am. Agree with lasix gtt. I  also agree with trying to wean amio as quickly as possible due to underlying lung disease. Digoxin will likely not due much for rate control in this setting. Would use low dose IV lopressor 2.5 IV q8 as respiratory status tolerates. VAD parameters look good. Continue coumadin.    I reviewed the LVAD parameters from today, and compared the results to the patient's prior recorded data.  No programming changes were made.  The LVAD is functioning within specified parameters.  Nursing performing LVAD self-test daily.  LVAD interrogation was negative for any significant power changes, alarms or PI events/speed drops.  LVAD equipment check  completed and is in good working order.  Back-up equipment present.   LVAD education done on emergency procedures and precautions and reviewed exit site care.  Length of Stay: 3  [name] 01/13/2013, 11:00 AM

## 2013-01-13 NOTE — Procedures (Deleted)
Arterial Catheter Insertion Procedure Note JASSIEL FLYE [id number] 04/09/1941  Procedure: Insertion of Arterial Catheter  Indications: Blood pressure monitoring  Procedure Details Consent: Unable to obtain consent because of emergent medical necessity. Time Out: Verified patient identification, verified procedure, site/side was marked, verified correct patient position, special equipment/implants available, medications/allergies/relevent history reviewed, required imaging and test results available.  Performed  Maximum sterile technique was used including antiseptics, cap, gloves, gown, hand hygiene, mask and sheet. Skin prep: Chlorhexidine; local anesthetic administered 20 gauge catheter was inserted into right radial artery using the Seldinger technique.  Evaluation Blood flow good; BP tracing good. Complications: No apparent complications.  Assisted by Jonny Ruiz Day, RRT   Carlynn Spry 01/13/2013

## 2013-01-13 NOTE — Progress Notes (Signed)
RT Note: Spoke with pt about CPAP he states he does try it every night at home but while he is here with "everything else going on he would like to hold off". I told him if he changes his mind to let us know. RT will continue to monitor

## 2013-01-13 NOTE — Progress Notes (Signed)
ANTICOAGULATION CONSULT NOTE   Pharmacy Consult for warfarin  Indication: atrial fibrillation and LVAD placed 4/15  No Known Allergies  Patient Measurements: Height: 6' (182.9 cm) Weight: 244 lb 11.4 oz (111 kg) IBW/kg (Calculated) : 77.6  Vital Signs: Temp: 100.2 F (37.9 C) (04/16 1215) Temp src: Core (Comment) (04/16 1200) BP: 76/70 mmHg (04/16 1038) Pulse Rate: 103 (04/16 1215)  Labs:  Recent Labs  01/11/13 0752 01/11/13 1530 01/12/13 0430  01/12/13 1519 01/12/13 1530  01/12/13 2100 01/12/13 2108 01/13/13 0350  HGB  --   --  9.1*  < > 7.6*  --   < > 8.2* 8.2* 8.5*  HCT  --   --  29.5*  < > 23.5*  --   < > 24.0* 24.6* 25.2*  PLT  --   --  135*  < > 130* 128*  --   --  123* 126*  APTT  --   --   --   --   --  36  --   --   --   --   LABPROT  --   --   --   --   --  16.6*  --   --   --  17.0*  INR  --   --   --   --   --  1.38  --   --   --  1.42  HEPARINUNFRC 0.29* 0.42 0.53  --   --   --   --   --   --   --   CREATININE  --   --  0.79  --  0.71  --   --  0.80 0.76 0.74  < > = values in this interval not displayed.  Estimated Creatinine Clearance: 109 ml/min (by C-G formula based on Cr of 0.74).  Medications:  Warfarin 5mg  daily except 2.5mg  on Thursday  Assessment: 72 year old male now POD#1 from LVAD. Patient was on coumadin prior to admission for afib, was on heparin bridge prior to surgery.  INR currently 1.4. No overt bleeding noted post-op. He is on amiodarone, will be conservative on warfarin dosing. D/w LVAD NP and will start with 2.5mg  tonight.  Patient on day 1/2 of vancomycin/zinacef abx - stop dates in place to meet scip measures.  Goal of Therapy:  INR 2-3 Monitor platelets by anticoagulation protocol: Yes   Plan:  Warfarin 2.5mg  tonight Daily INR for now  Sheppard Coil PharmD., BCPS Clinical Pharmacist Pager [phone] 01/13/2013 12:40 PM

## 2013-01-13 NOTE — Progress Notes (Signed)
PT Cancellation Note  Patient Details Name: [name] MRN: [id number] DOB: 1941/06/24   Cancelled Treatment:    Reason Eval/Treat Not Completed: Patient not medically ready. Orders received for evaluation to begin POD day 2. Will see patient tomorrow is appropriate.   Fabio Asa 01/13/2013, 11:02 AM Charlotte Crumb, PT DPT  [phone]

## 2013-01-13 NOTE — Progress Notes (Signed)
HeartMate 2 Rounding Note  Subjective:    [name] is a 72 year old gentleman with a history of CABG (2003), Chronic systolic HF EF 32%, S/P AICD (generator change 2011), hyperlipidemia, hypothyroidism, emphysema, OSA, intermittent atrial fibrillation (on coumadin). Quit smoking 2003. Uses CPAP and O2 every night. He is a disabled [employer]. He was referred to the AHF clinic by Dr. Elease Hashimoto and over the past couple of months his functional capacity has really declined. He has class IV heart failure symptoms and has not improved on OMM. His CPX in 11/13 showed a VO2 of 9.2%, VE/VCO2- 52.8, OUES 1.15 and RER= 1.02. We have been following him closely and have had multiple discussions about his options and the patient decided that he wanted to proceed with LVAD placement.   Admitted 01/10/13 for optimization before LVAD implant.   POD#1 Patient stable overnight. Weaning NO this am and will try to extubate later this morning. MAPs 70-80's. Remains on Levophed, milrinone, low dose dopamine, and amio. Weight up 19 lbs. Pocket drain 310 cc out.   PA 47/26 (33) CVP 13 Co-ox 70 INR 1.4  LVAD INTERROGATION:  HeartMate II LVAD:  Flow 5.1 liters/min, speed 9000, power 5.1, PI 5.3.    Objective:    Vital Signs:   Temp:  [98.4 F (36.9 C)-100.6 F (38.1 C)] 100 F (37.8 C) (04/16 0700) Pulse Rate:  [25-139] 104 (04/16 0700) Resp:  [9-24] 21 (04/16 0700) BP: (72-80)/(65-76) 72/66 mmHg (04/16 0350) SpO2:  [94 %-100 %] 99 % (04/16 0700) Arterial Line BP: (66-88)/(62-77) 79/70 mmHg (04/16 0700) FiO2 (%):  [50 %] 50 % (04/16 0700) Weight:  [102.1 kg (225 lb 1.4 oz)-111 kg (244 lb 11.4 oz)] 111 kg (244 lb 11.4 oz) (04/16 0600) Last BM Date: 01/10/13 Mean arterial Pressure 70-80's  Intake/Output:   Intake/Output Summary (Last 24 hours) at 01/13/13 0722 Last data filed at 01/13/13 0700  Gross per 24 hour  Intake 11846.21 ml  Output   4545 ml  Net 7301.21 ml     Physical Exam: General:  Intubated HEENT: normal Neck: supple. CVP 13 . Carotids 2+ bilat; no bruits. No lymphadenopathy or thryomegaly appreciated. Cor: Mechanical heart sounds with LVAD hum present. Lungs: diminished in the bases Abdomen: soft, nontender, mild distention. No hepatosplenomegaly. No bruits or masses. no bowel sounds. Driveline: Dressing C/D/I and securement device intact Extremities: no cyanosis, clubbing, rash, edema Neuro: alert & orientedx3, cranial nerves grossly intact. moves all 4 extremities w/o difficulty. Sedated  Telemetry: Afib 95 bpm  Labs: Basic Metabolic Panel:  Recent Labs Lab 01/10/13 1048 01/11/13 0340 01/12/13 0430  01/12/13 1336 01/12/13 1519 01/12/13 1539 01/12/13 2100 01/12/13 2108 01/13/13 0350  NA 139 136 136  < > 139 137 140 138  --  137  K 4.3 4.1 3.9  < > 3.7 3.4* 3.3* 3.5  --  4.4  CL 106 103 102  --   --  103  --  104  --  107  CO2 26 27 28   --   --  27  --   --   --  25  GLUCOSE 109* 133* 120*  < > 166* 139* 136* 152*  --  133*  BUN 16 13 14   --   --  12  --  11  --  11  CREATININE 0.97 0.84 0.79  --   --  0.71  --  0.80 0.76 0.74  CALCIUM 9.7 9.3 9.4  --   --  8.3*  --   --   --  8.7  MG  --   --   --   --   --  1.6  --   --  2.4 2.3  PHOS  --   --   --   --   --   --   --   --   --  1.6*  < > = values in this interval not displayed.  Liver Function Tests:  Recent Labs Lab 01/10/13 1048 01/13/13 0350  AST 20 42*  ALT 9 10  ALKPHOS 45  --   BILITOT 0.6 1.9*  PROT 6.7  --   ALBUMIN 3.3*  --    No results found for this basename: LIPASE, AMYLASE,  in the last 168 hours No results found for this basename: AMMONIA,  in the last 168 hours  CBC:  Recent Labs Lab 01/11/13 0340 01/12/13 0430  01/12/13 1250  01/12/13 1519 01/12/13 1530 01/12/13 1539 01/12/13 2100 01/12/13 2108 01/13/13 0350  WBC 7.0 8.5  --   --   --  10.6*  --   --   --  11.1* 11.7*  NEUTROABS  --   --   --   --   --   --   --   --   --   --  9.1*  HGB 8.8* 9.1*  < >  8.5*  < > 7.6*  --  8.8* 8.2* 8.2* 8.5*  HCT 28.9* 29.5*  < > 26.3*  < > 23.5*  --  26.0* 24.0* 24.6* 25.2*  MCV 81.9 81.5  --   --   --  79.9  --   --   --  79.1 79.0  PLT 130* 135*  --  115*  --  130* 128*  --   --  123* 126*  < > = values in this interval not displayed.  INR:  Recent Labs Lab 01/07/13 1016 01/12/13 1530 01/13/13 0350  INR 1.32 1.38 1.42    Other results:  EKG:   Imaging: Dg Chest Port 1 View  01/12/2013  *RADIOLOGY REPORT*  Clinical Data: 72 year old male status post cardiac ventricular assist device placement.  PORTABLE CHEST - 1 VIEW  Comparison: 01/10/2013.  Findings: Portable supine AP view 1545 hours.  Intubated. Endotracheal tube tip in good position between level of clavicles and carina.  Enteric tube courses to the abdomen, tip not included.  2 new left IJ approach central line are in place.  One of these appear to be a Swan-Ganz catheter which extends to the level of the right atrium and tip at the level of the main pulmonary outflow tract.  A second catheter courses through the inferior cavoatrial junction, tip not included (long arrow).  Right chest tube in place.  A portion of the elbow fat devices visible projecting over the left upper quadrant.  Stable left chest cardiac AICD.  No pneumothorax.  Stable scarring atelectasis at both lung bases. Mild to moderate pulmonary vascular congestion.  Stable cardiac size and mediastinal contours.  Stable sequelae of CABG.  IMPRESSION: 1.  Two left IJ approach central venous catheters, one with its tip at the level of the main pulmonary outflow tract, the second passes through the inferior cavoatrial junction directed into the upper abdomen, tip not included. 2.  Right chest tube in place with no pneumothorax identified.  ET tube and NG tube appear per early place. 3.  Increased pulmonary vascular congestion.  L5 device partially visible.   Original Report Authenticated By: Odessa Fleming III,  M.D.       Medications:      Scheduled Medications: . acetaminophen  1,000 mg Oral Q6H   Or  . acetaminophen (TYLENOL) oral liquid 160 mg/5 mL  975 mg Per Tube Q6H  . antiseptic oral rinse  15 mL Mouth Rinse QID  . aspirin EC  325 mg Oral Daily   Or  . aspirin  324 mg Per Tube Daily   Or  . aspirin  300 mg Rectal Daily  . bisacodyl  10 mg Oral Daily   Or  . bisacodyl  10 mg Rectal Daily  . budesonide-formoterol  2 puff Inhalation BID  . cefUROXime (ZINACEF)  IV  1.5 g Intravenous Q12H  . chlorhexidine  15 mL Mouth Rinse BID  . docusate sodium  200 mg Oral Daily  . fluconazole (DIFLUCAN) IV  400 mg Intravenous Once  . insulin regular  0-10 Units Intravenous TID WC  . levothyroxine  50 mcg Oral QHS  . [START ON 01/14/2013] pantoprazole  40 mg Oral Daily  . rifampin  600 mg Oral Once  . sodium chloride  3 mL Intravenous Q12H  . tiotropium  18 mcg Inhalation Daily  . vancomycin  1,000 mg Intravenous Q12H     Infusions: . sodium chloride    . sodium chloride 20 mL/hr at 01/12/13 1530  . sodium chloride    . amiodarone (NEXTERONE PREMIX) 360 mg/200 mL dextrose    . amiodarone (NEXTERONE PREMIX) 360 mg/200 mL dextrose 30 mg/hr (01/13/13 0100)  . dexmedetomidine 0.7 mcg/kg/hr (01/13/13 0532)  . DOPamine 2 mcg/kg/min (01/13/13 0100)  . insulin (NOVOLIN-R) infusion 2.7 Units/hr (01/13/13 0700)  . lactated ringers 60 mL/hr at 01/12/13 1530  . milrinone 0.4 mcg/kg/min (01/13/13 0100)  . nitroGLYCERIN Stopped (01/12/13 1530)  . norepinephrine (LEVOPHED) Adult infusion 8 mcg/min (01/13/13 1191)  . phenylephrine (NEO-SYNEPHRINE) Adult infusion Stopped (01/12/13 1530)     PRN Medications:  acetaminophen, albumin human, midazolam, morphine injection, ondansetron (ZOFRAN) IV, oxyCODONE, sodium chloride   Assessment:   1) Acute on chronic HF - class IV  2) Chronic AF on coumadin  3) ICM, EF 20%        S/p LVAD Heartmate II implant 01/12/13 4) CAD  S/p CABG 2003  5) COPD with depressed DLCO  CPAP at  night  6) HLD  7) Hypothyroidism   Plan/Discussion:    POD #1 Patient stable overnight and progressing well. Weaning NO and will try to extubate later this am. D/C femoral aline and may d/c swan later today. Weight up along with CVP, will start lasix drip at 31ml/hr. MAPs 70's will hopefully be able to wean levophed once NO off. INR 1.4, will talk to pharmacy about dose. Remains in Afib, will cut amiodarone gtt back to 40ml/hr and discuss with Dr. Gala Romney about starting digoxin.  I reviewed the LVAD parameters from today, and compared the results to the patient's prior recorded data.  No programming changes were made.  The LVAD is functioning within specified parameters.  Nursing performing LVAD self-test daily.  LVAD interrogation was negative for any significant power changes, alarms or PI events/speed drops.  LVAD equipment check completed and is in good working order.  Back-up equipment present.   LVAD education done on emergency procedures and precautions and reviewed exit site care.  Length of Stay: 3  Aundria Rud 01/13/2013, 7:22 AM

## 2013-01-13 NOTE — Progress Notes (Signed)
CT surgery p.m. Rounds  Extubated successfully, weaning nitric oxide via nasal cannula with adequate hemodynamics  Excellent diuresis with Lasix drip Controlled atrial for relation-amiodarone weaned off do to pulmonary comorbidity  Cardiac output 5 L, neuro intact

## 2013-01-13 NOTE — Progress Notes (Signed)
MetHb 1.4.  Value did not cross over into EPIC.

## 2013-01-13 NOTE — Progress Notes (Signed)
PULMONARY  / CRITICAL CARE MEDICINE  Name: [name] MRN: [id number] DOB: 1941/05/15    ADMISSION DATE:  01/10/2013 CONSULTATION DATE:  01/13/2013  REFERRING MD :  CVTS PRIMARY SERVICE: CVTS  CHIEF COMPLAINT:  VDRF and Cardiogenic Shock  BRIEF PATIENT DESCRIPTION: 72 year old with ischemic cardiomyopathy who underwent a LVAD placement for heart failure with an EF of 20%.  Patient also has history of smoking, COPD and pulmonary asbestosis.  PFTs with mixed obstructive (COPD) and restrictive (asbestos and obesity).  RHC showed moderate PHTN and low DLCO that is likely due to a mix of above.  Patient underwent LVAD placement and PCCM was reconsulted for vent management.  SIGNIFICANT EVENTS / STUDIES:  4/13 RHC 4/15 LVAD placement and intubation.  LINES / TUBES: ET tube 4/15>>> PAC 4/13>>> LVAD 4/15>>>  CULTURES: Blood culture 4/15>>> Urine 4/15>>> Sputum 4/15>>>  ANTIBIOTICS: Cefuroxime 4/15>>> Vancomycin 4/15>>> Rifampin 4/15>>>  SUBJECTIVE: 4/15 LVAD and TEE.  VITAL SIGNS: Temp:  [98.4 F (36.9 C)-100.6 F (38.1 C)] 100 F (37.8 C) (04/16 0800) Pulse Rate:  [25-139] 102 (04/16 0813) Resp:  [9-25] 25 (04/16 0813) BP: (72-82)/(65-76) 82/72 mmHg (04/16 0813) SpO2:  [94 %-100 %] 100 % (04/16 0813) Arterial Line BP: (66-88)/(62-77) 80/70 mmHg (04/16 0800) FiO2 (%):  [50 %] 50 % (04/16 0813) Weight:  [102.1 kg (225 lb 1.4 oz)-111 kg (244 lb 11.4 oz)] 111 kg (244 lb 11.4 oz) (04/16 0600) HEMODYNAMICS: PAP: (38-57)/(20-34) 47/26 mmHg CVP:  [12 mmHg-24 mmHg] 14 mmHg CO:  [5.3 L/min-59 L/min] 6.1 L/min CI:  [2.4 L/min/m2-2.9 L/min/m2] 2.7 L/min/m2 VENTILATOR SETTINGS: Vent Mode:  [-] SIMV;PRVC FiO2 (%):  [50 %] 50 % Set Rate:  [12 bmp] 12 bmp Vt Set:  [409 mL] 620 mL PEEP:  [5 cmH20] 5 cmH20 Pressure Support:  [10 cmH20] 10 cmH20 Plateau Pressure:  [13 cmH20-21 cmH20] 13 cmH20 INTAKE / OUTPUT: Intake/Output     04/15 0701 - 04/16 0700 04/16 0701 - 04/17 0700   P.O.     I.V. (mL/kg) 6635.2 (59.8) 82.3 (0.7)   Blood 2661    NG/GT 150    IV Piggyback 2400    Total Intake(mL/kg) 11846.2 (106.7) 82.3 (0.7)   Urine (mL/kg/hr) 2065 (0.8) 35 (0.2)   Emesis/NG output 200 (0.1)    Blood 1500 (0.6)    Chest Tube 780 (0.3)    Total Output 4545 35   Net +7301.2 +47.3         PHYSICAL EXAMINATION: General:  Chronically ill appearing obese male. Neuro:  Sedated and intubated but withdraws to pain. HEENT:  [place]/AT, PERRL, EOM-I, +JVD and -LAN. Cardiovascular:  IRIR, Nl S1/S2 (difficult to hear with LVAD however) and -M/R/G. Lungs:  End exp wheezes. Abdomen:  Soft, NT, ND and +BS. Musculoskeletal:  1+ edema and -tenderness. Skin:  Intact, sternotomy wound clean.  LABS:  Recent Labs Lab 01/07/13 1016  01/10/13 1048  01/12/13 0430  01/12/13 1519 01/12/13 1530 01/12/13 1539  01/12/13 2100 01/12/13 2105 01/12/13 2108 01/13/13 0350 01/13/13 0358 01/13/13 0400  HGB  --   --   --   < > 9.1*  < > 7.6*  --  8.8*  --  8.2*  --  8.2* 8.5*  --   --   WBC  --   --   --   < > 8.5  --  10.6*  --   --   --   --   --  11.1* 11.7*  --   --  PLT  --   --   --   < > 135*  < > 130* 128*  --   --   --   --  123* 126*  --   --   NA 137  --  139  < > 136  < > 137  --  140  --  138  --   --  137  --   --   K 4.6  --  4.3  < > 3.9  < > 3.4*  --  3.3*  --  3.5  --   --  4.4  --   --   CL 105  --  106  < > 102  --  103  --   --   --  104  --   --  107  --   --   CO2 25  --  26  < > 28  --  27  --   --   --   --   --   --  25  --   --   GLUCOSE 112*  --  109*  < > 120*  < > 139*  --  136*  --  152*  --   --  133*  --   --   BUN 18  --  16  < > 14  --  12  --   --   --  11  --   --  11  --   --   CREATININE 0.98  --  0.97  < > 0.79  --  0.71  --   --   --  0.80  --  0.76 0.74  --   --   CALCIUM 10.0  --  9.7  < > 9.4  --  8.3*  --   --   --   --   --   --  8.7  --   --   MG  --   --   --   --   --   --  1.6  --   --   --   --   --  2.4 2.3  --   --   PHOS  --   --    --   --   --   --   --   --   --   --   --   --   --  1.6*  --   --   AST  --   --  20  --   --   --   --   --   --   --   --   --   --  42*  --   --   ALT  --   --  9  --   --   --   --   --   --   --   --   --   --  10  --   --   ALKPHOS  --   --  45  --   --   --   --   --   --   --   --   --   --   --   --   --   BILITOT  --   --  0.6  --   --   --   --   --   --   --   --   --   --  1.9*  --   --   PROT  --   --  6.7  --   --   --   --   --   --   --   --   --   --   --   --   --   ALBUMIN  --   --  3.3*  --   --   --   --   --   --   --   --   --   --   --   --   --   APTT  --   --   --   --   --   --   --  36  --   --   --   --   --   --   --   --   INR 1.32  --   --   --   --   --   --  1.38  --   --   --   --   --  1.42  --   --   PROBNP  --   --  763.5*  --   --   --   --   --   --   --   --   --   --  1080.0*  --   --   PHART  --   < >  --   --   --   < >  --   --  7.356  < >  --  7.360  --   --  7.383 7.410  PCO2ART  --   < >  --   --   --   < >  --   --  47.3*  < >  --  44.3  --   --  42.2 39.1  PO2ART  --   < >  --   --   --   < >  --   --  106.0*  < >  --  134.0*  --   --  145.0* 148.0*  < > = values in this interval not displayed.  Recent Labs Lab 01/12/13 0556  GLUCAP 116*   CXR:  4/15>>>ET tube ok, LVAD and ICD noted.  ASSESSMENT / PLAN:  PULMONARY A: Post-op respiratory failure post LVAD placement.  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 P:   - Full vent support for now, changed to PRVC. - Titrate Nitric down 1 PPM/hr and once off will consider SBT, patient was able to wean very well and mental status has been great the only concern is hemodynamics. - Lasix drip 7 mg/hr. - F/U CXR and ABG. - Hold symbicort for now. - Xopenex.  CARDIOVASCULAR A: Cardiogenic shock, LVAD and pressors. P:  - Continue dopamine and levophed. - LVAD per CVTS. - Diurese as tolerated. - Amiodarone. - Heart failure service following.  RENAL A:   Low Phos. P:   - Lasix drip at 7 mg/hr, would continue as hemodynamics allow. - Replace Phos. - Daily BMET.  GASTROINTESTINAL A:  No current active issues. P:   - If unable to wean by AM will place OGT and begin TF.  HEMATOLOGIC A:  Leukocytosis. P:  - Pan cultures. - See ID section.  INFECTIOUS A:  No active signs of infection at this time. P:   - Continue abx. - Will f/u on cultures.  ENDOCRINE A:  Hyperglycemia.   P:   - ISS. - TF in AM if unable to wean.  NEUROLOGIC A:  No active issues. P:   - Precedex and fentanyl.  I have personally obtained a history, examined the patient, evaluated laboratory and imaging results, formulated the assessment and plan and placed orders.  CRITICAL CARE: The patient is critically ill with multiple organ systems failure and requires high complexity decision making for assessment and support, frequent evaluation and titration of therapies, application of advanced monitoring technologies and extensive interpretation of multiple databases. Critical Care Time devoted to patient care services described in this note is 45 minutes.   Alyson Reedy, M.D. Pulmonary and Critical Care Medicine [facility] Pager: [phone]  01/13/2013, 8:29 AM

## 2013-01-13 NOTE — Op Note (Signed)
[name], [name]              ACCOUNT NO.:  [id number]  MEDICAL RECORD NO.:  [id number]  LOCATION:  2310                         FACILITY:  [facility]  PHYSICIAN:  Kerin Perna, M.D.  DATE OF BIRTH:  January 14, 1941  DATE OF PROCEDURE:  01/12/2013 DATE OF DISCHARGE:                              OPERATIVE REPORT   OPERATIONS: 1. Redo sternotomy and implantation of HeartMate II, durable left     ventricular assist device. 2. Placement of femoral A-line for blood pressure monitoring.  PREOPERATIVE DIAGNOSES:  Ischemic cardiomyopathy with ejection fraction of 15%, and class 4 congestive heart failure, acute-on-chronic heart failure.  POSTOPERATIVE DIAGNOSES:  Ischemic cardiomyopathy with ejection fraction of 15%, and class 4 congestive heart failure, acute-on-chronic heart failure.  SURGEON:  Kerin Perna, MD  ASSISTANT:  Lenard Lance, MD  SECOND ASSISTANT:  Hilda Lias Irwin_________ RNFA.  ANESTHESIA:  General by Dr. Guadalupe Maple.  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  He subsequently had an AICD placed for EF less than 30%.  Over the years, his left ventricle dilated and he developed progressive heart failure and was followed in the [facility] Failure [facility].  He became refractory to medical therapy and oral medications and was admitted for IV milrinone. Right and left heart cath demonstrated EF of 15%, pulmonary hypertension, normal CVP, and patent vein grafts and mammary artery graft.  He was felt to be candidate for destination LV assist device with a HeartMate II.  He was carefully evaluated by the heart failure - mechanical support service and underwent evaluation by the social worker, palliative care, education by the _VAD coordinato__, and evaluation by the cardiologist and cardiac surgery.  I reviewed the results of his cardiac cath and echo with the patient and family.  I  discussed indications and expected benefits of LVAD placement for treatment of his end-stage heart disease.  I discussed the alternatives to LVAD therapy. I reviewed with the patient and his wife in the office the major aspects of surgery including location of the surgical incisions, use of general anesthesia and cardiopulmonary bypass, and expected postoperative hospital recovery.  I discussed with the patient and wife the risks including risks of stroke, bleeding, blood transfusion, multisystem failure, and death.  He demonstrated his understanding and agreed to proceed with surgery under what I felt was an informed consent.  OPERATIVE FINDINGS: 1. Dilated _hypocontractile  LV. 2. Patent vein grafts to the right coronary and circumflex and patent     left IMA to LAD identified and preserved. 3. Cannulation of the proximal ascending aortic arch and right atrium     with cardiopulmonary bypass time of 88 minutes.  OPERATIVE PROCEDURE:  The patient was brought to the operating room and placed in the supine position in the OR table.  General anesthesia was induced under invasive hemodynamic monitoring and the transesophageal echo probe was placed by the anesthesiologist.  A proper time-out was performed.  The patient was prepped and draped using a sterile field. The left femoral artery blood pressure monitoring line was placed and the right femoral vein venous micro-introducer  was  placed.  A redo sternal incision was made using the oscillating saw with care being taken to avoid injury to the underlying vascular structures.  The anterior mediastinum was carefully dissected.  The sternal elevating retractor was placed and the dissection was completed on the left side and identification of the mammary artery pedicle was completed and this was encircled with a vessel loop.  Also identification of the vein graft to the circumflex and vein graft to the right coronary were identified and  protected.  The ascending aorta was dissected for cannulation and right atrium was then dissected.  The sternal retractor was then placed, and heparin was administered after the tunnelers were placed for the LVAD driveline.  Pursestrings were placed in the ascending aorta above the previously placed cannulation site in the right atrium and after the ACT was documented as being therapeutic.  The patient was cannulated, but not placed on cardiopulmonary bypass.  The partial occluding clamp was placed on the ascending aorta, above the vein grafts.  An aortotomy was performed and the outflow graft of 8-mm Hemashield was sewn end-to-side with running 4-0 Prolene and and a light layer of CoSeal was applied. . The partial occluding clamp was removed and the graft was clamped with a vascular clamp and there was minimal bleeding.  Next, the patient was placed on cardiopulmonary bypass.  The remainder of the posterior adhesions were dissected to allow mobilization of the LV apex.  It should be noted that the LVAD pocket had been completed prior to heparinization taken down to the left hemidiaphragm and extending beneath the posterior rectus sheath halfway to the umbilicus.  The LVAD driveline exit site was in the left upper quadrant.  After the heart had been mobilized, a location for the apical cannula was selected approximately 2 finger widths lateral to the LAD and above the natural apex.  Using a Foley catheter and the coring device, a plug of apical myocardium was removed and excised.  A vent was placed in the LV cavity.  The LV cavity was inspected and there was no thrombus.  Some prominent trabeculae were excised.  CO2 was used to insufflate the surgical field.  A 2-0 Ethibond pledget sutures then placed around the circumference of the LV apical opening.  These were then placed to the _Silastic sleeve and Dacron graft introducer and the sutures were all carefully tied.  This was  covered with a light coat of Co-Seal.  Two extra sutures were placed for some small probe openings in between the sutures, and the closure was then secured.  Next, the patient was placed in Trendelenburg and volume was left in the heart and the lungs were gently ventilated and the pump was brought to the field.  The apical cannula of the pump was then inserted into silastic sleeve.  The attached Ethibond suture was wrapped around the cannula and tied.  A tie band was then placed in the mid portion of the connection and secured.  Above and below the tie band, a #4 Ethibond heavy suture was then tied.  Next, the outflow aspect of the pump was connected to the outflow graft, which had been clamped, and a 20-gauge needle was placed between the pump and the graft vascular clamp.  The pump was then placed into the pump pocket and inotropes at a low level were started as well as nitric oxide to the ventilator circuit and the lungs were being ventilated. The patient was then weaned off of the cardiopulmonary bypass  onto the assist device.  First pump steep speed was set at 6000 rpm's while the Outflow graft was clamped . Flow was reduced to half flow on cardiopulmonary bypass.  The pump speed was increased to 7000 rpm's and the clamp on the graft was removed.  There is little intracardiac air by echo.  We came off the heart-lung bypass at 8000 rpm's, and there is still pulsatility and opening of the aortic valve, so the rpm's were increased up to 8800. The septum was midline, the apical cannula was in good position, and there was good RV function.  Protamine was administered.  The patient remained hemodynamically stable.  The cannula was removed.  A 4-0 Prolene pledgeted suture was placed in the opening in the outflow graft.  The Bend relief was then connected to the outflow connector to prevent kinking of the graft and this was secured with the titanium circular lock mechanism.  The wound  was irrigated with sterile saline.  Hemostasis was obtained. The patient did require a unit of platelets for low platelet count.  An anterior mediastinal, a pump pocket drainage tube, and a right pleural tube were placed and brought through separate incisions.  The left pleura was obliterated and was not opened.  The patient remained stable.  The sternum was closed with wire.  The pectoralis fascia was closed with interrupted #1 Vicryl on the inferior aspect in running Vicryl on the superior aspect.  The subcutaneous layer was closed with running 2-0 Vicryl and the skin was closed with a subcuticular.  The right upper quadrant drive line relief incision was closed carefully with Vicryl.  The exit site was closed with some subcutaneous Vicryl and two 3-0 nylon sutures.  Sterile dressings were applied.  The patient was then placed on battery power from the system monitor and returned to the ICU in critical but stable condition. Total cardiopulmonary bypass time was 58 minutes.     Kerin Perna, M.D.     PV/MEDQ  D:  01/12/2013  T:  01/13/2013  Job:  [id number]  cc:   Duke Salvia, MD, [facility] Bevelyn Buckles. Bensimhon, MD

## 2013-01-13 NOTE — Procedures (Signed)
Extubation Procedure Note  Patient Details:   Name: [name] DOB: 1941-04-15 MRN: [id number]   Airway Documentation:     Evaluation  O2 sats: stable throughout Complications: No apparent complications Patient did tolerate procedure well. Bilateral Breath Sounds: Clear Suctioning: Airway Yes  Pt tolerated wean, okay to extubate at this time. Pt positive for cuff leak, extubated to 6lpm [name] with 4ppm NO bleed in. Pt tolerated extubation, no dyspnea or stridor noted at this time. Pt resting comfortably at this time. RT will continue to monitor.   Beatris Si 01/13/2013, 3:55 PM

## 2013-01-13 NOTE — Progress Notes (Signed)
Anesthesiology Follow-up:  POD #1 S/P Heartmate II LVAD implantation for ischemic cardiomyopathy with refractory CHF.   Stable overnight, neuro intact,  following commands, now weaning nitric oxide with plans to wean vent and extubate today. VS: T- 37.9 BP 78/70 HR 110 (Afib) PA 51/27 CVP 16 SVO2 65% CO/CI 6.2/2.8   LVAD Parmeters: PUMP Speed: 8980 Flow: 4.8 lpm Power 5 watts Pulsatility index: 5.1  Precedex: 0.7 mcg/kg/min Dopamine: 26mcg/kg/min Lasix 7 mg/hr Milrinone: 0.4 mcg/kg/min Norepi: 8 mcg./min  FIO2: 40 PRVC 550 RR 16 PEEP 5   PH 7.41 PCO2 39 PO2 148 K-4.4 BUN/Cr 11/0.74 glucose 133 H/H: 8.5/25.2 Plts: 126,000 WBC 11,700  Patient doing well so far, as noted plan to wean vent today, wean pressors as tolerated. No apparent complications  Kipp Brood, MD

## 2013-01-13 NOTE — Progress Notes (Signed)
NUTRITION FOLLOW UP  Intervention:    If EN started, recommend Osmolite 1.5 formula at 10 ml/hr with Prostat liquid Protein 60 ml 5 times daily to provide 1360 kcals (62% of estimated kcal needs), 165 gm protein (100% of estimated protein needs), 183 ml of free water  Liquid MVI daily via tube RD to follow for nutrition care plan  New Nutrition Dx:   Inadequate oral intake related to inability to eat as evidenced by NPO status  New Goal:   Initiate EN support within next 24-48 hours if prolonged intubation expected  Monitor:   EN initiation, respiratory status, weight, labs, I/O's  Assessment:   Patient s/p procedure 4/15: INSERTION OF IMPLANTABLE LEFT VENTRICULAR ASSIST DEVICE  Patient is currently intubated on ventilator support ---> OGT in place MV: 11.4 Temp: 37.6  CCM note reviewed; if unable to wean by 4/17 AM, to being tube feeding.  Height: Ht Readings from Last 1 Encounters:  01/12/13 6' (1.829 m)    Weight Status:   Wt Readings from Last 1 Encounters:  01/13/13 244 lb 11.4 oz (111 kg)    Body mass index is 33.18 kg/(m^2).  Re-estimated needs:  Kcal: 2191 Protein: 160-170 gm Fluid: per MD  Skin: Intact  Diet Order: NPO   Intake/Output Summary (Last 24 hours) at 01/13/13 0957 Last data filed at 01/13/13 0900  Gross per 24 hour  Intake 11803.71 ml  Output   4330 ml  Net 7473.71 ml    Labs:   Recent Labs Lab 01/12/13 0430  01/12/13 1519 01/12/13 1539 01/12/13 2100 01/12/13 2108 01/13/13 0350  NA 136  < > 137 140 138  --  137  K 3.9  < > 3.4* 3.3* 3.5  --  4.4  CL 102  --  103  --  104  --  107  CO2 28  --  27  --   --   --  25  BUN 14  --  12  --  11  --  11  CREATININE 0.79  --  0.71  --  0.80 0.76 0.74  CALCIUM 9.4  --  8.3*  --   --   --  8.7  MG  --   --  1.6  --   --  2.4 2.3  PHOS  --   --   --   --   --   --  1.6*  GLUCOSE 120*  < > 139* 136* 152*  --  133*  < > = values in this interval not displayed.  CBG (last 3)    Recent Labs  01/12/13 0556  GLUCAP 116*    Scheduled Meds: . acetaminophen  1,000 mg Oral Q6H   Or  . acetaminophen (TYLENOL) oral liquid 160 mg/5 mL  975 mg Per Tube Q6H  . antiseptic oral rinse  15 mL Mouth Rinse QID  . aspirin EC  325 mg Oral Daily   Or  . aspirin  324 mg Per Tube Daily   Or  . aspirin  300 mg Rectal Daily  . bisacodyl  10 mg Oral Daily   Or  . bisacodyl  10 mg Rectal Daily  . budesonide-formoterol  2 puff Inhalation BID  . cefUROXime (ZINACEF)  IV  1.5 g Intravenous Q12H  . chlorhexidine  15 mL Mouth Rinse BID  . docusate sodium  200 mg Oral Daily  . insulin glargine  12 Units Subcutaneous BID  . insulin regular  0-10 Units Intravenous TID WC  .  levothyroxine  50 mcg Oral QHS  . [START ON 01/14/2013] pantoprazole  40 mg Oral Daily  . sodium chloride  3 mL Intravenous Q12H  . tiotropium  18 mcg Inhalation Daily  . vancomycin  1,000 mg Intravenous Q12H    Continuous Infusions: . sodium chloride    . sodium chloride 20 mL/hr at 01/12/13 1530  . sodium chloride    . amiodarone (NEXTERONE PREMIX) 360 mg/200 mL dextrose    . dexmedetomidine 0.7 mcg/kg/hr (01/13/13 0900)  . DOPamine 2 mcg/kg/min (01/13/13 0900)  . furosemide (LASIX) infusion 7 mg/hr (01/13/13 0900)  . insulin (NOVOLIN-R) infusion 2.9 mL/hr at 01/13/13 0900  . milrinone 0.4 mcg/kg/min (01/13/13 0900)  . nitroGLYCERIN Stopped (01/12/13 1530)  . norepinephrine (LEVOPHED) Adult infusion 8 mcg/min (01/13/13 0900)  . phenylephrine (NEO-SYNEPHRINE) Adult infusion Stopped (01/12/13 1530)    Maureen Chatters, RD, LDN Pager #: [phone] After-Hours Pager #: [phone]

## 2013-01-14 ENCOUNTER — Inpatient Hospital Stay (HOSPITAL_COMMUNITY): Payer: Medicare Other

## 2013-01-14 DIAGNOSIS — Z95818 Presence of other cardiac implants and grafts: Secondary | ICD-10-CM

## 2013-01-14 DIAGNOSIS — R5383 Other fatigue: Secondary | ICD-10-CM

## 2013-01-14 DIAGNOSIS — G4733 Obstructive sleep apnea (adult) (pediatric): Secondary | ICD-10-CM

## 2013-01-14 DIAGNOSIS — R531 Weakness: Secondary | ICD-10-CM

## 2013-01-14 DIAGNOSIS — R209 Unspecified disturbances of skin sensation: Secondary | ICD-10-CM

## 2013-01-14 DIAGNOSIS — L7682 Other postprocedural complications of skin and subcutaneous tissue: Secondary | ICD-10-CM

## 2013-01-14 DIAGNOSIS — I5023 Acute on chronic systolic (congestive) heart failure: Secondary | ICD-10-CM

## 2013-01-14 LAB — BASIC METABOLIC PANEL
BUN: 10 mg/dL (ref 6–23)
Chloride: 96 mEq/L (ref 96–112)
GFR calc non Af Amer: >90 mL/min (ref >90)
Glucose, Bld: 146 mg/dL — ABNORMAL HIGH (ref 70–99)
Potassium: 3.7 mEq/L (ref 3.5–5.1)

## 2013-01-14 LAB — GLUCOSE, CAPILLARY
Glucose-Capillary: 147 mg/dL — ABNORMAL HIGH (ref 70–99)
Glucose-Capillary: 135 mg/dL — ABNORMAL HIGH (ref 70–99)
Glucose-Capillary: 134 mg/dL — ABNORMAL HIGH (ref 70–99)
Glucose-Capillary: 104 mg/dL — ABNORMAL HIGH (ref 70–99)
Glucose-Capillary: 150 mg/dL — ABNORMAL HIGH (ref 70–99)
Glucose-Capillary: 134 mg/dL — ABNORMAL HIGH (ref 70–99)

## 2013-01-14 LAB — TYPE AND SCREEN
ABO/RH(D): O POS
Unit division: 0
Unit division: 0
Unit division: 0
Unit division: 0
Unit division: 0

## 2013-01-14 LAB — POCT I-STAT 3, ART BLOOD GAS (G3+)
Acid-Base Excess: 2 mmol/L (ref 0.0–2.0)
Bicarbonate: 25.2 mEq/L — ABNORMAL HIGH (ref 20.0–24.0)
O2 Saturation: 81 %
Patient temperature: 98.6
TCO2: 26 mmol/L (ref 0–100)
pCO2 arterial: 34.5 mmHg — ABNORMAL LOW (ref 35.0–45.0)
pH, Arterial: 7.472 — ABNORMAL HIGH (ref 7.350–7.450)
pO2, Arterial: 41 mmHg — ABNORMAL LOW (ref 80.0–100.0)

## 2013-01-14 LAB — POCT I-STAT, CHEM 8
BUN: 12 mg/dL (ref 6–23)
Calcium, Ion: 1.33 mmol/L — ABNORMAL HIGH (ref 1.13–1.30)
Chloride: 93 mEq/L — ABNORMAL LOW (ref 96–112)
Creatinine, Ser: 0.7 mg/dL (ref 0.50–1.35)
Glucose, Bld: 173 mg/dL — ABNORMAL HIGH (ref 70–99)
HCT: 26 % — ABNORMAL LOW (ref 39.0–52.0)
Hemoglobin: 8.8 g/dL — ABNORMAL LOW (ref 13.0–17.0)
Potassium: 4.1 mEq/L (ref 3.5–5.1)
Sodium: 132 mEq/L — ABNORMAL LOW (ref 135–145)
TCO2: 29 mmol/L (ref 0–100)

## 2013-01-14 LAB — CARBOXYHEMOGLOBIN
Carboxyhemoglobin: 2.4 % — ABNORMAL HIGH (ref 0.5–1.5)
Carboxyhemoglobin: 2.4 % — ABNORMAL HIGH (ref 0.5–1.5)
Methemoglobin: 0.6 % (ref 0.0–1.5)
Methemoglobin: 1 % (ref 0.0–1.5)
O2 Saturation: 76.7 %
Total hemoglobin: 7.1 g/dL — ABNORMAL LOW (ref 13.5–18.0)
Total hemoglobin: 8.3 g/dL — ABNORMAL LOW (ref 13.5–18.0)

## 2013-01-14 LAB — CBC WITH DIFFERENTIAL/PLATELET
Basophils Relative: 0 % (ref 0–1)
Eosinophils Absolute: 0.1 10*3/uL (ref 0.0–0.7)
Eosinophils Relative: 1 % (ref 0–5)
HCT: 22.6 % — ABNORMAL LOW (ref 39.0–52.0)
Hemoglobin: 7.4 g/dL — ABNORMAL LOW (ref 13.0–17.0)
MCH: 26 pg (ref 26.0–34.0)
MCHC: 32.7 g/dL (ref 30.0–36.0)
MCV: 79.3 fL (ref 78.0–100.0)
Monocytes Absolute: 1.3 10*3/uL — ABNORMAL HIGH (ref 0.1–1.0)
Monocytes Relative: 10 % (ref 3–12)

## 2013-01-14 MED ORDER — SODIUM CHLORIDE 0.9 % IJ SOLN: 10.0000 mL | INTRAMUSCULAR | Status: DC | PRN

## 2013-01-14 MED ORDER — WARFARIN SODIUM 2.5 MG PO TABS
2.5000 mg | ORAL_TABLET | Freq: Once | ORAL | Status: AC
  Administered 2013-01-14: 2.5 mg via ORAL
  Filled 2013-01-14: qty 1

## 2013-01-14 MED ORDER — CITALOPRAM HYDROBROMIDE 20 MG PO TABS
20.0000 mg | ORAL_TABLET | Freq: Every day | ORAL | Status: DC
  Administered 2013-01-14 – 2013-01-20 (×7): 20 mg via ORAL
  Filled 2013-01-14 (×7): qty 1

## 2013-01-14 MED ORDER — TRAMADOL HCL 50 MG PO TABS
50.0000 mg | ORAL_TABLET | Freq: Four times a day (QID) | ORAL | Status: DC | PRN
  Administered 2013-01-15 – 2013-01-16 (×2): 50 mg via ORAL
  Filled 2013-01-14 (×2): qty 1

## 2013-01-14 MED ORDER — SODIUM CHLORIDE 0.9 % IJ SOLN
10.0000 mL | Freq: Two times a day (BID) | INTRAMUSCULAR | Status: DC
  Administered 2013-01-15 – 2013-01-16 (×2): 10 mL

## 2013-01-14 MED ORDER — FUROSEMIDE 10 MG/ML IJ SOLN
40.0000 mg | Freq: Two times a day (BID) | INTRAMUSCULAR | Status: DC
  Administered 2013-01-14 – 2013-01-15 (×3): 40 mg via INTRAVENOUS
  Filled 2013-01-14 (×3): qty 4

## 2013-01-14 MED ORDER — LEVOTHYROXINE SODIUM 50 MCG PO TABS: 50.0000 ug | ORAL_TABLET | Freq: Every day | ORAL | Status: DC

## 2013-01-14 MED ORDER — POTASSIUM CHLORIDE 10 MEQ/50ML IV SOLN
10.0000 meq | INTRAVENOUS | Status: AC
  Administered 2013-01-14 (×3): 10 meq via INTRAVENOUS
  Filled 2013-01-14: qty 150

## 2013-01-14 NOTE — Progress Notes (Signed)
RT Note: Pt refusing CPAP at this time he said he has been nauseated all day off and on. I told him if he changes his mind to let us know and we will get him setup on our machine. RT will continue to monitor

## 2013-01-14 NOTE — Progress Notes (Signed)
T. CTS    LVAD service patient examined and medical record reviewed,agree with above note. [name],[name] 01/14/2013 Hemodynamically stable with adequate LVAD function overnight, no PI events  Hemoglobin has drifted down to 7.4 we'll transfuse one unit of packed cells for expected postop blood loss anemia  Nitric oxide weaned off with adequate hemodynamics. Will remove Swan and mobilize patient out of bed, remove right pleural chest tube. A line has been removed.  Will decrease milrinone 0.3, place PICC line and slowly wean norepinephrine to maintain mean blood pressure greater than 65 mmHg  Pulmonary status remains very stable, chest x-ray with minimal atelectasis.

## 2013-01-14 NOTE — Evaluation (Signed)
Occupational Therapy Evaluation Patient Details Name: [name] MRN: [id number] DOB: July 19, 1941 Today's Date: 01/14/2013 Time: 8295-6213 OT Time Calculation (min): 43 min  OT Assessment / Plan / Recommendation Clinical Impression  Pt s/p LVAD placement.  Will benefit from continued acute OT services to address below problem list. Recommending HHOT and 24/7 sup/assist for d/c planning.    OT Assessment  Patient needs continued OT Services    Follow Up Recommendations  Home health OT;Supervision/Assistance - 24 hour    Barriers to Discharge      Equipment Recommendations   (TBD)    Recommendations for Other Services    Frequency  Min 3X/week    Precautions / Restrictions Precautions Precautions: Sternal (LVAD) Precaution Comments: LVAD Required Braces or Orthoses: Other Brace/Splint Other Brace/Splint: LVAD black bag at all times Restrictions Weight Bearing Restrictions: No (sternal precautions)   Pertinent Vitals/Pain See vitals    ADL  Grooming: Performed;Wash/dry face;Minimal assistance Where Assessed - Grooming: Supported sitting Upper Body Bathing: Simulated;Moderate assistance Where Assessed - Upper Body Bathing: Supported sitting Lower Body Bathing: Simulated;Maximal assistance Where Assessed - Lower Body Bathing: Supported sit to stand Upper Body Dressing: Simulated;Maximal assistance Where Assessed - Upper Body Dressing: Supported sitting Lower Body Dressing: Simulated;+1 Total assistance Where Assessed - Lower Body Dressing: Supported sit to Pharmacist, hospital: Chief of Staff: Patient Percentage: 60% Statistician Method: Sit to Barista:  (bed ambulating to chair) Equipment Used:  (pushed w/c) Transfers/Ambulation Related to ADLs: +2 total assist for ambulation with 3rd person providing close chair follow.  Assist for steadying.  Pt quickly fatigues. ADL Comments: Educated and demonstrated  switching LVAD terminal unit <>battery power and on importance of bringing black bag with backup batteries with pt everywhere.  Pt able to manipulate equipment to switch between terminal unit and battery power with mod assit and max verbal cueing.  Pt eager to learn and participate in handling LVAD equipment.    OT Diagnosis: Generalized weakness;Acute pain  OT Problem List: Decreased strength;Decreased activity tolerance;Impaired balance (sitting and/or standing);Decreased knowledge of use of DME or AE;Decreased knowledge of precautions;Obesity;Pain OT Treatment Interventions: Self-care/ADL training;DME and/or AE instruction;Energy conservation;Therapeutic activities;Patient/family education;Balance training   OT Goals Acute Rehab OT Goals OT Goal Formulation: With patient Time For Goal Achievement: 01/28/13 Potential to Achieve Goals: Good ADL Goals Pt Will Perform Grooming: with supervision;Standing at sink ADL Goal: Grooming - Progress: Goal set today Pt Will Perform Upper Body Bathing: with set-up;Sitting, edge of bed;Sitting, chair;Unsupported ADL Goal: Upper Body Bathing - Progress: Goal set today Pt Will Perform Lower Body Bathing: with set-up;Sit to stand from bed;Sit to stand from chair;Unsupported;with adaptive equipment ADL Goal: Lower Body Bathing - Progress: Goal set today Pt Will Perform Upper Body Dressing: with set-up;Sitting, chair;Sitting, bed;Unsupported ADL Goal: Upper Body Dressing - Progress: Goal set today Pt Will Perform Lower Body Dressing: with set-up;Sit to stand from chair;Sit to stand from bed;Unsupported;with adaptive equipment ADL Goal: Lower Body Dressing - Progress: Goal set today Pt Will Transfer to Toilet: with supervision;Ambulation;with DME;Comfort height toilet ADL Goal: Toilet Transfer - Progress: Goal set today Pt Will Perform Toileting - Clothing Manipulation: with set-up;Standing ADL Goal: Toileting - Clothing Manipulation - Progress: Goal set  today Pt Will Perform Toileting - Hygiene: with set-up;Sit to stand from 3-in-1/toilet ADL Goal: Toileting - Hygiene - Progress: Goal set today Miscellaneous OT Goals Miscellaneous OT Goal #1: Pt will perform bed mobility with supervision as precursor for EOB ADLs. OT Goal: Miscellaneous  Goal #1 - Progress: Goal set today Miscellaneous OT Goal #2: Pt will independently manipulate all LVAD equipment in preparation for functional mobility.  OT Goal: Miscellaneous Goal #2 - Progress: Goal set today  Visit Information  Last OT Received On: 01/14/13 Assistance Needed: +2 PT/OT Co-Evaluation/Treatment: Yes    Subjective Data      Prior Functioning     Home Living Lives With: Spouse Available Help at Discharge: Family;Available 24 hours/day Type of Home: House Home Access: Stairs to enter [employer] of Steps: 4 Entrance Stairs-Rails: Right;Left Home Layout: One level Bathroom Shower/Tub: Engineer, manufacturing systems: Standard Home Adaptive Equipment:  (O2) Additional Comments: Pt uses 5 gallon bucket as a shower chair in tub. Prior Function Level of Independence: Needs assistance (due to fatigue and SOB) Able to Take Stairs?: No Driving: No Vocation: Retired Dominant Hand: Right         Vision/Perception     Cognition  Cognition Arousal/Alertness: Awake/alert Behavior During Therapy: WFL for tasks assessed/performed Overall Cognitive Status: Within Functional Limits for tasks assessed    Extremity/Trunk Assessment Right Upper Extremity Assessment RUE ROM/Strength/Tone: [facility] for tasks assessed;Unable to fully assess RUE Sensation: WFL - Light Touch;WFL - Proprioception RUE Coordination: WFL - gross/fine motor Left Upper Extremity Assessment LUE ROM/Strength/Tone: WFL for tasks assessed;Unable to fully assess LUE Sensation: WFL - Light Touch;WFL - Proprioception LUE Coordination: WFL - gross/fine motor Right Lower Extremity Assessment RLE  ROM/Strength/Tone: WFL for tasks assessed RLE Sensation: WFL - Light Touch;WFL - Proprioception RLE Coordination: WFL - gross/fine motor Left Lower Extremity Assessment LLE ROM/Strength/Tone: WFL for tasks assessed LLE Sensation: WFL - Light Touch;WFL - Proprioception LLE Coordination: WFL - gross/fine motor Trunk Assessment Trunk Assessment: Normal     Mobility Bed Mobility Bed Mobility: Supine to Sit;Sitting - Scoot to Edge of Bed Supine to Sit: 1: +2 Total assist Supine to Sit: Patient Percentage: 60% Sitting - Scoot to Edge of Bed: 3: Mod assist Details for Bed Mobility Assistance: Assist needed to maintain precautions, VCs for positioning and technique Transfers Transfers: Sit to Stand;Stand to Sit Sit to Stand: 3: Mod assist;Without upper extremity assist;From bed Stand to Sit: 3: Mod assist;Without upper extremity assist;To chair/3-in-1 Details for Transfer Assistance: VCs for sternal precautions; assist to elevate trunk and control descent while complying with precautions     Exercise     Balance Balance Balance Assessed: Yes Static Sitting Balance Static Sitting - Balance Support: Feet supported Static Sitting - Level of Assistance: 3: Mod assist Static Sitting - Comment/# of Minutes: 16 minutes while going through conversion to battery pack and education and teach back/talk through techniques. VCs provided throughout process. Static Standing Balance Static Standing - Balance Support: Bilateral upper extremity supported Static Standing - Level of Assistance: 4: Min assist Static Standing - Comment/# of Minutes: standing rest break during ambulation and prior to ambulaton (4 minutes)   End of Session OT - End of Session Equipment Utilized During Treatment:  (w/c) Activity Tolerance: Patient limited by fatigue Patient left: in chair;with call bell/phone within reach Nurse Communication: Mobility status  GO    01/14/2013 Cipriano Mile OTR/L Pager [phone]  Office [phone]  Cipriano Mile 01/14/2013, 5:12 PM

## 2013-01-14 NOTE — Progress Notes (Signed)
PULMONARY  / CRITICAL CARE MEDICINE  Name: [name] MRN: [id number] DOB: 08-16-1941    ADMISSION DATE:  01/10/2013 CONSULTATION DATE:  01/13/2013  REFERRING MD :  CVTS PRIMARY SERVICE: CVTS  CHIEF COMPLAINT:  VDRF and Cardiogenic Shock  BRIEF PATIENT DESCRIPTION: 72 year old with ischemic cardiomyopathy who underwent a LVAD placement for heart failure with an EF of 20%.  Patient also has history of smoking, COPD and pulmonary asbestosis.  PFTs with mixed obstructive (COPD) and restrictive (asbestos and obesity).  RHC showed moderate PHTN and low DLCO that is likely due to a mix of above.  Patient underwent LVAD placement and PCCM was reconsulted for vent management.    SUBJECTIVE: No new complaints. No distress  VITAL SIGNS: Temp:  [98.3 F (36.8 C)-100.4 F (38 C)] 98.3 F (36.8 C) (04/17 1015) Pulse Rate:  [25-133] 88 (04/17 1100) Resp:  [18-32] 21 (04/17 1100) BP: (71-81)/(61-71) 81/68 mmHg (04/16 1550) SpO2:  [86 %-100 %] 94 % (04/17 1100) Arterial Line BP: (50-80)/(39-71) 71/61 mmHg (04/17 0200) FiO2 (%):  [40 %-50 %] 50 % (04/16 1500) Weight:  [109.1 kg (240 lb 8.4 oz)] 109.1 kg (240 lb 8.4 oz) (04/17 0500) HEMODYNAMICS: PAP: (30-76)/(4-34) 64/26 mmHg CVP:  [2 mmHg-24 mmHg] 13 mmHg CO:  [5.2 L/min-6.9 L/min] 6.3 L/min CI:  [2.3 L/min/m2-3.1 L/min/m2] 2.8 L/min/m2 VENTILATOR SETTINGS: Vent Mode:  [-] PSV;CPAP FiO2 (%):  [40 %-50 %] 50 % Set Rate:  [16 bmp] 16 bmp Vt Set:  [550 mL] 550 mL PEEP:  [5 cmH20] 5 cmH20 Pressure Support:  [5 cmH20] 5 cmH20 Plateau Pressure:  [17 cmH20] 17 cmH20 INTAKE / OUTPUT: Intake/Output     04/16 0701 - 04/17 0700 04/17 0701 - 04/18 0700   I.V. (mL/kg) 1984.3 (18.2) 136 (1.2)   Blood     NG/GT     IV Piggyback 1310 50   Total Intake(mL/kg) 3294.3 (30.2) 186 (1.7)   Urine (mL/kg/hr) 3090 (1.2) 875 (1.9)   Emesis/NG output 300 (0.1)    Blood     Chest Tube 605 (0.2) 50 (0.1)   Total Output 3995 925   Net -700.7 -739          PHYSICAL EXAMINATION: General:  NAD Neuro:  No focal deficits HEENT: WNL Cardiovascular: HS obscured Lungs: Clear anteriorly Abdomen:  Soft, NT, ND and +BS. Ext:  Tr edema   LABS:  Recent Labs Lab 01/10/13 1048  01/12/13 1519 01/12/13 1530  01/12/13 2100  01/13/13 0350  01/13/13 1534 01/13/13 1555 01/13/13 1603 01/13/13 1716 01/14/13 0321 01/14/13 0400  HGB  --   < > 7.6*  --   < > 8.2*  < > 8.5*  --   --  8.4* 9.2*  --   --  7.4*  WBC  --   < > 10.6*  --   --   --   < > 11.7*  --   --  13.8*  --   --   --  12.4*  PLT  --   < > 130* 128*  --   --   < > 126*  --   --  127*  --   --   --  100*  NA 139  < > 137  --   < > 138  --  137  --   --   --  133*  --   --  129*  K 4.3  < > 3.4*  --   < >  3.5  --  4.4  --   --   --  3.9  --   --  3.7  CL 106  < > 103  --   --  104  --  107  --   --   --  99  --   --  96  CO2 26  < > 27  --   --   --   --  25  --   --   --   --   --   --  28  GLUCOSE 109*  < > 139*  --   < > 152*  --  133*  --   --   --  165*  --   --  146*  BUN 16  < > 12  --   --  11  --  11  --   --   --  7  --   --  10  CREATININE 0.97  < > 0.71  --   --  0.80  < > 0.74  --   --  0.74 0.70  --   --  0.63  CALCIUM 9.7  < > 8.3*  --   --   --   --  8.7  --   --   --   --   --   --  9.2  MG  --   --  1.6  --   --   --   < > 2.3  --   --  1.9  --   --   --  1.9  PHOS  --   --   --   --   --   --   --  1.6*  --   --   --   --   --   --  2.1*  AST 20  --   --   --   --   --   --  42*  --   --   --   --   --   --   --   ALT 9  --   --   --   --   --   --  10  --   --   --   --   --   --   --   ALKPHOS 45  --   --   --   --   --   --   --   --   --   --   --   --   --   --   BILITOT 0.6  --   --   --   --   --   --  1.9*  --   --   --   --   --   --   --   PROT 6.7  --   --   --   --   --   --   --   --   --   --   --   --   --   --   ALBUMIN 3.3*  --   --   --   --   --   --   --   --   --   --   --   --   --   --   APTT  --   --   --  36  --   --   --   --   --   --    --   --   --   --   --  INR  --   --   --  1.38  --   --   --  1.42  --   --   --   --   --   --  1.55*  PROBNP 763.5*  --   --   --   --   --   --  1080.0*  --   --   --   --   --   --   --   PHART  --   < >  --   --   < >  --   < >  --   < > 7.440  --   --  7.420 7.472*  --   PCO2ART  --   < >  --   --   < >  --   < >  --   < > 33.6*  --   --  35.1 34.5*  --   PO2ART  --   < >  --   --   < >  --   < >  --   < > 64.0*  --   --  94.0 41.0*  --   < > = values in this interval not displayed.  Recent Labs Lab 01/13/13 1602 01/13/13 2010 01/13/13 2342 01/14/13 0409 01/14/13 0750  GLUCAP 155* 136* 134* 135* 134*   CXR: CM, vasc cong, mild interstitial edema  ASSESSMENT / PLAN:  Post op resp failure, resolved COPD, no active bronchospasm - OSA - Controlled PAH - NO weaned to off this AM.  Mixed obst/rest and markedly reduced DLCO  Plan:  -Cont supplemental O2 as close to 24 hrs per day as possible  Cont inhaled BDs -Cont nocturnal CPAP  -Post op mgmt per TCTS -HD mgmt per LVAD team   PCCM will sign off. Please call if we can be of further assistance   Billy Fischer, MD ; [facility] [phone].  After 5:30 PM or weekends, call [phone]

## 2013-01-14 NOTE — Progress Notes (Signed)
HeartMate 2 Rounding Note  Subjective:    [name] is a 72 year old gentleman with a history of CABG (2003), Chronic systolic HF EF 32%, S/P AICD (generator change 2011), hyperlipidemia, hypothyroidism, emphysema, OSA, intermittent atrial fibrillation (on coumadin). Quit smoking 2003. Uses CPAP and O2 every night. He is a disabled [employer]. He was referred to the AHF clinic by Dr. Elease Hashimoto and over the past couple of months his functional capacity has really declined. He has class IV heart failure symptoms and has not improved on OMM. His CPX in 11/13 showed a VO2 of 9.2%, VE/VCO2- 52.8, OUES 1.15 and RER= 1.02. We have been following him closely and have had multiple discussions about his options and the patient decided that he wanted to proceed with LVAD placement.   Admitted 01/10/13 for optimization before LVAD implant. EF by LV gram 20% 01/07/13  POD#2 Extubated yesterday and remained on NO through [place]. Stopped lasix gtt last night, CVP 6-8. Trying to wean levophed off, MAPs 60-70s. Amiodarone stopped and digoxin started, remains in afib RVR. Weight down 4 lbs.   PA 36/12 (20) CVP 8-10 Co-ox 80.5 INR 1.55  LVAD INTERROGATION:  HeartMate II LVAD:  Flow 4.7 liters/min, speed 9000, power 5.7, PI 5.6.  No PI events  Objective:    Vital Signs:   Temp:  [98.4 F (36.9 C)-100.4 F (38 C)] 98.6 F (37 C) (04/17 0700) Pulse Rate:  [25-133] 35 (04/17 0700) Resp:  [18-32] 18 (04/17 0700) BP: (71-82)/(61-72) 81/68 mmHg (04/16 1550) SpO2:  [86 %-100 %] 100 % (04/17 0700) Arterial Line BP: (50-82)/(39-72) 71/61 mmHg (04/17 0200) FiO2 (%):  [40 %-50 %] 50 % (04/16 1500) Weight:  [240 lb 8.4 oz (109.1 kg)] 240 lb 8.4 oz (109.1 kg) (04/17 0500) Last BM Date: 01/10/13 Mean arterial Pressure 70-80's  Intake/Output:   Intake/Output Summary (Last 24 hours) at 01/14/13 0727 Last data filed at 01/14/13 0700  Gross per 24 hour  Intake 3294.28 ml  Output   3995 ml  Net -700.72 ml     Physical  Exam: General: NAD, Laying in bed HEENT: normal Neck: supple. CVP 8-9 . Carotids 2+ bilat; no bruits. No lymphadenopathy or thryomegaly appreciated. Cor: Mechanical heart sounds with LVAD hum present. Lungs: diminished in the bases Abdomen: soft, nontender, no distention. No hepatosplenomegaly. No bruits or masses. no bowel sounds. Driveline: Dressing C/D/I and securement device intact Extremities: no cyanosis, clubbing, rash, edema Neuro: alert & orientedx3, cranial nerves grossly intact. moves all 4 extremities w/o difficulty  Telemetry: Afib 103 bpm  Labs: Basic Metabolic Panel:  Recent Labs Lab 01/11/13 0340 01/12/13 0430  01/12/13 1519 01/12/13 1539 01/12/13 2100 01/12/13 2108 01/13/13 0350 01/13/13 1555 01/13/13 1603 01/14/13 0400  NA 136 136  < > 137 140 138  --  137  --  133* 129*  K 4.1 3.9  < > 3.4* 3.3* 3.5  --  4.4  --  3.9 3.7  CL 103 102  --  103  --  104  --  107  --  99 96  CO2 27 28  --  27  --   --   --  25  --   --  28  GLUCOSE 133* 120*  < > 139* 136* 152*  --  133*  --  165* 146*  BUN 13 14  --  12  --  11  --  11  --  7 10  CREATININE 0.84 0.79  --  0.71  --  0.80 0.76 0.74 0.74 0.70 0.63  CALCIUM 9.3 9.4  --  8.3*  --   --   --  8.7  --   --  9.2  MG  --   --   --  1.6  --   --  2.4 2.3 1.9  --  1.9  PHOS  --   --   --   --   --   --   --  1.6*  --   --  2.1*  < > = values in this interval not displayed.  Liver Function Tests:  Recent Labs Lab 01/10/13 1048 01/13/13 0350  AST 20 42*  ALT 9 10  ALKPHOS 45  --   BILITOT 0.6 1.9*  PROT 6.7  --   ALBUMIN 3.3*  --    No results found for this basename: LIPASE, AMYLASE,  in the last 168 hours No results found for this basename: AMMONIA,  in the last 168 hours  CBC:  Recent Labs Lab 01/12/13 1519 01/12/13 1530  01/12/13 2108 01/13/13 0350 01/13/13 1555 01/13/13 1603 01/14/13 0400  WBC 10.6*  --   --  11.1* 11.7* 13.8*  --  12.4*  NEUTROABS  --   --   --   --  9.1*  --   --  10.2*   HGB 7.6*  --   < > 8.2* 8.5* 8.4* 9.2* 7.4*  HCT 23.5*  --   < > 24.6* 25.2* 25.6* 27.0* 22.6*  MCV 79.9  --   --  79.1 79.0 79.8  --  79.3  PLT 130* 128*  --  123* 126* 127*  --  100*  < > = values in this interval not displayed.  INR:  Recent Labs Lab 01/07/13 1016 01/12/13 1530 01/13/13 0350 01/14/13 0400  INR 1.32 1.38 1.42 1.55*    Other results:  EKG:   Imaging: Dg Chest Port 1 View  01/13/2013  *RADIOLOGY REPORT*  Clinical Data: Ventricular assist device  PORTABLE CHEST - 1 VIEW  Comparison: Portable exam 0630 hours compared to 01/12/2013  Findings: Left ventricular assist device seen at inferior margin of exam. Tip of endotracheal tube approximately 4.9 cm above carina. Nasogastric tube extends into abdomen. Left subclavian AICD lead again noted. Mediastinal drain and question right basilar thoracostomy tube. Left jugular central venous catheter with tip projecting over left brachiocephalic vein near SVC confluence. Left jugular Swan-Ganz catheter tip projecting over main pulmonary artery. Enlargement of cardiac silhouette. Bibasilar atelectasis, cannot exclude consolidation in the left lower lobe. Lungs clear. No pneumothorax.  IMPRESSION: Support devices as above. Enlargement of cardiac silhouette. Bibasilar atelectasis, cannot exclude left lower lobe consolidation. Compared to previous exam, increased opacification is seen in the left lower lobe.   Original Report Authenticated By: Ulyses Southward, M.D.    Dg Chest Port 1 View  01/12/2013  *RADIOLOGY REPORT*  Clinical Data: 72 year old male status post cardiac ventricular assist device placement.  PORTABLE CHEST - 1 VIEW  Comparison: 01/10/2013.  Findings: Portable supine AP view 1545 hours.  Intubated. Endotracheal tube tip in good position between level of clavicles and carina.  Enteric tube courses to the abdomen, tip not included.  2 new left IJ approach central line are in place.  One of these appear to be a Swan-Ganz catheter  which extends to the level of the right atrium and tip at the level of the main pulmonary outflow tract.  A second catheter courses through the inferior cavoatrial junction, tip not  included (long arrow).  Right chest tube in place.  A portion of the elbow fat devices visible projecting over the left upper quadrant.  Stable left chest cardiac AICD.  No pneumothorax.  Stable scarring atelectasis at both lung bases. Mild to moderate pulmonary vascular congestion.  Stable cardiac size and mediastinal contours.  Stable sequelae of CABG.  IMPRESSION: 1.  Two left IJ approach central venous catheters, one with its tip at the level of the main pulmonary outflow tract, the second passes through the inferior cavoatrial junction directed into the upper abdomen, tip not included. 2.  Right chest tube in place with no pneumothorax identified.  ET tube and NG tube appear per early place. 3.  Increased pulmonary vascular congestion.  L5 device partially visible.   Original Report Authenticated By: Erskine Speed, M.D.      Medications:     Scheduled Medications: . acetaminophen  1,000 mg Oral Q6H   Or  . acetaminophen (TYLENOL) oral liquid 160 mg/5 mL  975 mg Per Tube Q6H  . antiseptic oral rinse  15 mL Mouth Rinse BID  . aspirin EC  325 mg Oral Daily  . bisacodyl  10 mg Oral Daily   Or  . bisacodyl  10 mg Rectal Daily  . budesonide-formoterol  2 puff Inhalation BID  . digoxin  0.125 mg Oral Daily  . docusate sodium  200 mg Oral Daily  . insulin aspart  0-24 Units Subcutaneous Q4H  . insulin glargine  12 Units Subcutaneous BID  . levothyroxine  50 mcg Oral QHS  . metoprolol tartrate  12.5 mg Oral BID  . pantoprazole  40 mg Oral Daily  . potassium chloride  10 mEq Intravenous Q1 Hr x 3  . sodium chloride  3 mL Intravenous Q12H  . tiotropium  18 mcg Inhalation Daily  . Warfarin - Pharmacist Dosing Inpatient   Does not apply q1800    Infusions: . sodium chloride    . sodium chloride 20 mL/hr at 01/12/13  1530  . sodium chloride    . DOPamine 2 mcg/kg/min (01/13/13 2000)  . milrinone 0.3 mcg/kg/min (01/14/13 0325)  . nitroGLYCERIN Stopped (01/12/13 1530)  . norepinephrine (LEVOPHED) Adult infusion 5 mcg/min (01/14/13 0400)  . phenylephrine (NEO-SYNEPHRINE) Adult infusion Stopped (01/12/13 1530)    PRN Medications: acetaminophen, midazolam, morphine injection, ondansetron (ZOFRAN) IV, oxyCODONE, sodium chloride   Assessment:   1) Acute on chronic HF - class IV  2) Chronic AF on coumadin  3) ICM, EF 20%        S/p LVAD Heartmate II implant 01/12/13 4) CAD  S/p CABG 2003  5) COPD with depressed DLCO  CPAP at night  6) HLD  7) Hypothyroidism  8) Expected blood loss anemia  Plan/Discussion:    POD #2 Patient extubated yesterday and stable overnight. Remains on levophed, milrinone, and dopamine. Will wean NO through [place] off. Turn milrinone down to 0.3 and then try to wean levophed off to keep MAP > 60. Will discontinue SGC and pleural tube. Insert 2L PICC. Hgb remains low 7.4, will transfuse 1 PRBC today. CVP 8-10, will start IV diuretics BID. OOB to chair today and try to ambulate.    I reviewed the LVAD parameters from today, and compared the results to the patient's prior recorded data.  No programming changes were made.  The LVAD is functioning within specified parameters.  Nursing performing LVAD self-test daily.  LVAD interrogation was negative for any significant power changes, alarms or PI events/speed  drops.  LVAD equipment check completed and is in good working order.  Back-up equipment present.   LVAD education done on emergency procedures and precautions and reviewed exit site care.  Length of Stay: 4  Aundria Rud 01/14/2013, 7:27 AM

## 2013-01-14 NOTE — Progress Notes (Signed)
Peripherally Inserted Central Catheter/Midline Placement  The IV Nurse has discussed with the patient and/or persons authorized to consent for the patient, the purpose of this procedure and the potential benefits and risks involved with this procedure.  The benefits include less needle sticks, lab draws from the catheter and patient may be discharged home with the catheter.  Risks include, but not limited to, infection, bleeding, blood clot (thrombus formation), and puncture of an artery; nerve damage and irregular heat beat.  Alternatives to this procedure were also discussed.  PICC/Midline Placement Documentation        Stacye Noori, Lajean Manes 01/14/2013, 4:10 PM

## 2013-01-14 NOTE — Progress Notes (Signed)
ANTICOAGULATION CONSULT NOTE   Pharmacy Consult for warfarin  Indication: atrial fibrillation and LVAD placed 4/15  No Known Allergies  Patient Measurements: Height: 6' (182.9 cm) Weight: 240 lb 8.4 oz (109.1 kg) IBW/kg (Calculated) : 77.6  Vital Signs: Temp: 98.3 F (36.8 C) (04/17 1015) Temp src: Oral (04/17 1015) Pulse Rate: 100 (04/17 1015)  Labs:  Recent Labs  01/11/13 1530  01/12/13 0430  01/12/13 1519 01/12/13 1530  01/13/13 0350 01/13/13 1555 01/13/13 1603 01/14/13 0400  HGB  --   --  9.1*  < > 7.6*  --   < > 8.5* 8.4* 9.2* 7.4*  HCT  --   --  29.5*  < > 23.5*  --   < > 25.2* 25.6* 27.0* 22.6*  PLT  --   --  135*  < > 130* 128*  < > 126* 127*  --  100*  APTT  --   --   --   --   --  36  --   --   --   --   --   LABPROT  --   --   --   --   --  16.6*  --  17.0*  --   --  18.1*  INR  --   --   --   --   --  1.38  --  1.42  --   --  1.55*  HEPARINUNFRC 0.42  --  0.53  --   --   --   --   --   --   --   --   CREATININE  --   < > 0.79  --  0.71  --   < > 0.74 0.74 0.70 0.63  < > = values in this interval not displayed.  Estimated Creatinine Clearance: 108.1 ml/min (by C-G formula based on Cr of 0.63).  Medications:  Warfarin 5mg  daily except 2.5mg  on Thursday  Assessment: 72 year old male now POD#2 from LVAD. Patient was on coumadin prior to admission for afib, was on heparin bridge prior to surgery.  INR currently 1.55 now trending upward. No overt bleeding noted post-op. Hgb down to 7.4 felt to be ABLA post-op, will receive one unit today. He is now off amiodarone, which may increase his coumadin requirements in the coming days. Will repeat 2.5mg  tonight.  Patient has completed post-op abx.  Goal of Therapy:  INR 2-3 Monitor platelets by anticoagulation protocol: Yes   Plan:  Warfarin 2.5mg  tonight Daily INR for now  Sheppard Coil PharmD., BCPS Clinical Pharmacist Pager [phone] 01/14/2013 10:53 AM

## 2013-01-14 NOTE — Consult Note (Signed)
Patient AV:[id number] CAMIL WILHELMSEN      DOB: 1941-01-26      JYN:[id number]     Consult Note from the Palliative Medicine Team at [facility]    Consult Requested by:  Ulla Potash NL     PCP: Thayer Headings, MD Reason for Consultation:Preparedness Plan/ GOC     Phone [phone]    This NP Lorinda Creed reviewed medical records, received report from team, assessed the patient and then meet at the bedside with his wife/main support person Ellis Mehaffey # [phone] to discuss advanced directives and a preparedness plan in light of recent LVAD implantation.  This is destination therapy.  A detailed discussion was had today regarding the concept of a preparedness plan as it relates to LVAD as destination therapy..    Patient and wife were comfortable talking about the "what ifs and the importance of today's conversation so everyone can have all the information to be full participants and to understand the patient's basic beliefs and wishes as it relates  to healthcare.  Concepts specific to future possibilities of -long term ventilation -artificial feeding and hydration -long term antibiotic use -dialysis --psychological adjustments -need to terminate the pump- -possibility of other chronic or terminal disease unrelated to cardiac LVAD  Patient was able to verbalize to his family the importance of quality of life.  He is hopeful  That LVAD procedure will increase his qulaity of life.  He understands the risks and benefits of the LVAD as it relates to his future.  At this time patient is open to all available medical interventions to prolong life and the success of the LVAD therapy. He shared and verbalized an understanding that he and his family would know when the burdens of treatment would outweigh the benefits and trust in his family's support at that time.  Patient and family were encouraged to continue conversation into the future as it is vital for the patient centered  care.  Strong community church support.  Patient and wife were encouraged by a visit from "[place]" a LVAD patient herself.   Brief HPI: 72 yo male with h/o CABG (2003), CHF/EF 32%, AICD/2011, emphysema, OSA, A-fib, hypothyroidism -LVAD placed on 01-10-13, now extubated and progressing  ROS: intermittently pain    PMH:  Past Medical History  Diagnosis Date  . Ischemic cardiomyopathy      CABG 2003, PCI 2007  EF 27%(myoview 2012  . CHF (congestive heart failure)     class I-II  . Sleep apnea     ?  [name] Intestine disorder     blockage  . ICD (implantable cardiac defibrillator) in place   . Hyperlipidemia   . Hypothyroidism   . Chronic anticoagulation   . Obesity   . COPD (chronic obstructive pulmonary disease)   . Asbestosis   . Atrial fibrillation     paroxysmal; on coumadin  . Paroxysmal ventricular tachycardia     has ICD in place  . Coronary artery disease      PSH: Past Surgical History  Procedure Laterality Date  . Coronary artery bypass graft    . Cardiac defibrillator placement    . Insertion of implantable left ventricular assist device N/A 01/12/2013    Procedure: INSERTION OF IMPLANTABLE LEFT VENTRICULAR ASSIST DEVICE;  Surgeon: Kerin Perna, MD;  Location: [facility] OR;  Service: Open Heart Surgery;  Laterality: N/A;   nitric oxide; Redo sternotomy  . Intraoperative transesophageal echocardiogram N/A 01/12/2013    Procedure: INTRAOPERATIVE TRANSESOPHAGEAL ECHOCARDIOGRAM;  Surgeon: Theron Arista  Donata Clay, MD;  Location: [facility] OR;  Service: Open Heart Surgery;  Laterality: N/A;   I have reviewed the FH and SH and  If appropriate update it with new information. No Known Allergies Scheduled Meds: . acetaminophen  1,000 mg Oral Q6H   Or  . acetaminophen (TYLENOL) oral liquid 160 mg/5 mL  975 mg Per Tube Q6H  . antiseptic oral rinse  15 mL Mouth Rinse BID  . aspirin EC  325 mg Oral Daily  . bisacodyl  10 mg Oral Daily   Or  . bisacodyl  10 mg Rectal Daily  .  budesonide-formoterol  2 puff Inhalation BID  . citalopram  20 mg Oral Daily  . digoxin  0.125 mg Oral Daily  . docusate sodium  200 mg Oral Daily  . furosemide  40 mg Intravenous BID  . insulin aspart  0-24 Units Subcutaneous Q4H  . insulin glargine  12 Units Subcutaneous BID  . levothyroxine  50 mcg Oral QHS  . metoprolol tartrate  12.5 mg Oral BID  . pantoprazole  40 mg Oral Daily  . sodium chloride  3 mL Intravenous Q12H  . tiotropium  18 mcg Inhalation Daily  . warfarin  2.5 mg Oral ONCE-1800  . Warfarin - Pharmacist Dosing Inpatient   Does not apply q1800   Continuous Infusions: . sodium chloride    . sodium chloride 20 mL/hr at 01/12/13 1530  . sodium chloride    . DOPamine 2 mcg/kg/min (01/13/13 2000)  . milrinone 0.3 mcg/kg/min (01/14/13 1000)  . nitroGLYCERIN Stopped (01/12/13 1530)  . norepinephrine (LEVOPHED) Adult infusion 1 mcg/min (01/14/13 1000)  . phenylephrine (NEO-SYNEPHRINE) Adult infusion Stopped (01/12/13 1530)   PRN Meds:.acetaminophen, midazolam, morphine injection, ondansetron (ZOFRAN) IV, oxyCODONE, sodium chloride    BP 81/68  Pulse 26  Temp(Src) 97.9 F (36.6 C) (Oral)  Resp 18  Ht 6' (1.829 m)  Wt 109.1 kg (240 lb 8.4 oz)  BMI 32.61 kg/m2  SpO2 94%     Intake/Output Summary (Last 24 hours) at 01/14/13 1456 Last data filed at 01/14/13 1400  Gross per 24 hour  Intake 2467.7 ml  Output   2810 ml  Net -342.3 ml    Physical Exam:  General:  NAD, supine in bed HEENT:  Mm, no exudate Chest:   Diminished in bases, CTA CVS: mechanical heart sounds with LVAD hum Abdomen:soft NT +BS Ext: without edema Neuro:alert and oriented X3  Labs: CBC    Component Value Date/Time   WBC 12.4* 01/14/2013 0400   RBC 2.85* 01/14/2013 0400   HGB 7.4* 01/14/2013 0400   HCT 22.6* 01/14/2013 0400   PLT 100* 01/14/2013 0400   MCV 79.3 01/14/2013 0400   MCH 26.0 01/14/2013 0400   MCHC 32.7 01/14/2013 0400   RDW 16.3* 01/14/2013 0400   LYMPHSABS 0.9 01/14/2013  0400   MONOABS 1.3* 01/14/2013 0400   EOSABS 0.1 01/14/2013 0400   BASOSABS 0.0 01/14/2013 0400    BMET    Component Value Date/Time   NA 129* 01/14/2013 0400   K 3.7 01/14/2013 0400   CL 96 01/14/2013 0400   CO2 28 01/14/2013 0400   GLUCOSE 146* 01/14/2013 0400   BUN 10 01/14/2013 0400   CREATININE 0.63 01/14/2013 0400   CALCIUM 9.2 01/14/2013 0400   GFRNONAA >90 01/14/2013 0400   GFRAA >90 01/14/2013 0400    CMP     Component Value Date/Time   NA 129* 01/14/2013 0400   K 3.7 01/14/2013 0400  CL 96 01/14/2013 0400   CO2 28 01/14/2013 0400   GLUCOSE 146* 01/14/2013 0400   BUN 10 01/14/2013 0400   CREATININE 0.63 01/14/2013 0400   CALCIUM 9.2 01/14/2013 0400   PROT 6.7 01/10/2013 1048   ALBUMIN 3.3* 01/10/2013 1048   AST 42* 01/13/2013 0350   ALT 10 01/13/2013 0350   ALKPHOS 45 01/10/2013 1048   BILITOT 1.9* 01/13/2013 0350   GFRNONAA >90 01/14/2013 0400   GFRAA >90 01/14/2013 0400      Time In Time Out Total Time Spent with Patient Total Overall Time  1200 1315 70 min 75 min    Greater than 50%  of this time was spent counseling and coordinating care related to the above assessment and plan. Lorinda Creed NP  Palliative Medicine Team Team Phone # [phone] Pager [phone]  PMT will continue to support holistically

## 2013-01-14 NOTE — Progress Notes (Signed)
Patient ID: [name], male   DOB: 03/15/1941, 72 y.o.   MRN: [id number]                   [address]            [place] [id number]          [phone]     2 Days Post-Op Procedure(s) (LRB): INSERTION OF IMPLANTABLE LEFT VENTRICULAR ASSIST DEVICE (N/A) INTRAOPERATIVE TRANSESOPHAGEAL ECHOCARDIOGRAM (N/A)  Total Length of Stay:  LOS: 4 days  BP 81/68  Pulse 104  Temp(Src) 98.3 F (36.8 C) (Oral)  Resp 14  Ht 6' (1.829 m)  Wt 240 lb 8.4 oz (109.1 kg)  BMI 32.61 kg/m2  SpO2 98%  .Intake/Output     04/17 0701 - 04/18 0700   I.V. (mL/kg) 400 (3.7)   Blood 350   IV Piggyback 50   Total Intake(mL/kg) 800 (7.3)   Urine (mL/kg/hr) 1265 (0.9)   Emesis/NG output    Chest Tube 140 (0.1)   Total Output 1405   Net -605         . sodium chloride    . sodium chloride 20 mL/hr at 01/12/13 1530  . sodium chloride    . DOPamine 2 mcg/kg/min (01/13/13 2000)  . milrinone 0.3 mcg/kg/min (01/14/13 1000)  . nitroGLYCERIN Stopped (01/12/13 1530)  . norepinephrine (LEVOPHED) Adult infusion 1 mcg/min (01/14/13 1000)  . phenylephrine (NEO-SYNEPHRINE) Adult infusion Stopped (01/12/13 1530)     Lab Results  Component Value Date   WBC 12.4* 01/14/2013   HGB 8.8* 01/14/2013   HCT 26.0* 01/14/2013   PLT 100* 01/14/2013   GLUCOSE 173* 01/14/2013   CHOL 126 07/02/2012   TRIG 134.0 07/02/2012   HDL 40.80 07/02/2012   LDLCALC 58 07/02/2012   ALT 10 01/13/2013   AST 42* 01/13/2013   NA 132* 01/14/2013   K 4.1 01/14/2013   CL 93* 01/14/2013   CREATININE 0.70 01/14/2013   BUN 12 01/14/2013   CO2 28 01/14/2013   INR 1.55* 01/14/2013   Stable day  Delight Ovens MD  Beeper [phone] Office [phone] 01/14/2013 7:47 PM

## 2013-01-14 NOTE — Evaluation (Signed)
Physical Therapy Evaluation Patient Details Name: [name] MRN: [id number] DOB: 06/04/1941 Today's Date: 01/14/2013 Time: 4098-1191 PT Time Calculation (min): 43 min  PT Assessment / Plan / Recommendation Clinical Impression  Pt is a 72 y.o. male s/p LVAD placement. Patient demonstrates very positive demeanor. Pt educated on importance of black bag. Patient walked through process of converting to battery, with some assist provided. Patient able to perform successful test of system and batteries with initial VCs. Patient assisted with conversion back to terminal unit after ambulation. Pt demonstrates deficits in functional mobility secondary to pain, fatigue, decreased activity tolerance and adjustments to medications. Anticipate patient will make steady progress with mobility. Will continue to see as indicated to educate on use of LVAD, address deficits in mobility,  and maximize independence.     PT Assessment  Patient needs continued PT services    Follow Up Recommendations  Home health PT;Supervision/Assistance - 24 hour    Does the patient have the potential to tolerate intense rehabilitation      Barriers to Discharge None      Equipment Recommendations  Other (comment) (Rollator)    Recommendations for Other Services     Frequency Min 5X/week    Precautions / Restrictions Precautions Precautions: Sternal (LVAD) Precaution Comments: LVAD Required Braces or Orthoses: Other Brace/Splint Other Brace/Splint: LVAD black bag at all times Restrictions Weight Bearing Restrictions: No (sternal precautions)   Pertinent Vitals/Pain O2 range (89 - 97% on 4 liters with activity)      Mobility  Bed Mobility Bed Mobility: Supine to Sit;Sitting - Scoot to Edge of Bed Supine to Sit: 1: +2 Total assist Supine to Sit: Patient Percentage: 60% Sitting - Scoot to Edge of Bed: 3: Mod assist Details for Bed Mobility Assistance: Assist needed to maintain precautions, VCs for  positioning and technique Transfers Transfers: Sit to Stand;Stand to Sit Sit to Stand: 3: Mod assist Stand to Sit: 3: Mod assist Details for Transfer Assistance: VCs for sternal precautions; assist to elevate trunk and control descent while complying with precautions Ambulation/Gait Ambulation/Gait Assistance: 4: Min assist Ambulation Distance (Feet): 16 Feet Assistive device: Other (Comment) (held onto wheelchair) Ambulation/Gait Assistance Details: VCs for pursed lip breathing, vitals monitored, some initial unsteadiness Gait Pattern: Step-to pattern;Shuffle;Trunk flexed;Narrow base of support Gait velocity: decreased Stairs: No        PT Diagnosis: Abnormality of gait;Generalized weakness;Acute pain  PT Problem List: Decreased strength;Decreased activity tolerance;Decreased balance;Decreased mobility;Decreased coordination;Decreased knowledge of precautions;Cardiopulmonary status limiting activity;Pain PT Treatment Interventions: DME instruction;Gait training;Stair training;Functional mobility training;Therapeutic activities;Therapeutic exercise;Balance training;Patient/family education   PT Goals Acute Rehab PT Goals PT Goal Formulation: With patient Time For Goal Achievement: 01/28/13 Potential to Achieve Goals: Good Pt will go Supine/Side to Sit: with modified independence PT Goal: Supine/Side to Sit - Progress: Goal set today Pt will go Sit to Supine/Side: with modified independence PT Goal: Sit to Supine/Side - Progress: Goal set today Pt will go Sit to Stand: with modified independence PT Goal: Sit to Stand - Progress: Goal set today Pt will go Stand to Sit: with modified independence PT Goal: Stand to Sit - Progress: Goal set today Pt will Ambulate: >150 feet;with modified independence PT Goal: Ambulate - Progress: Goal set today  Visit Information  Last PT Received On: 01/14/13 Assistance Needed: +2    Subjective Data  Subjective: I would like to go a little  further Patient Stated Goal: to go home   Prior Functioning  Home Living Lives With: Spouse Available Help  at Discharge: Family;Available 24 hours/day Type of Home: House Bathroom Shower/Tub: Tub/shower unit Prior Function Level of Independence: Needs assistance (due to fatigue and SOB) Able to Take Stairs?: No Driving: No Vocation: Retired Dominant Hand: Right    Cognition  Cognition Arousal/Alertness: Awake/alert Behavior During Therapy: WFL for tasks assessed/performed Overall Cognitive Status: Within Functional Limits for tasks assessed    Extremity/Trunk Assessment Right Upper Extremity Assessment RUE ROM/Strength/Tone: [facility] for tasks assessed;Unable to fully assess RUE Sensation: WFL - Light Touch;WFL - Proprioception RUE Coordination: WFL - gross/fine motor Left Upper Extremity Assessment LUE ROM/Strength/Tone: WFL for tasks assessed;Unable to fully assess LUE Sensation: WFL - Light Touch;WFL - Proprioception LUE Coordination: WFL - gross/fine motor Right Lower Extremity Assessment RLE ROM/Strength/Tone: WFL for tasks assessed RLE Sensation: WFL - Light Touch;WFL - Proprioception RLE Coordination: WFL - gross/fine motor Left Lower Extremity Assessment LLE ROM/Strength/Tone: WFL for tasks assessed LLE Sensation: WFL - Light Touch;WFL - Proprioception LLE Coordination: WFL - gross/fine motor Trunk Assessment Trunk Assessment: Normal   Balance Balance Balance Assessed: Yes Static Sitting Balance Static Sitting - Balance Support: Feet supported Static Sitting - Level of Assistance: 3: Mod assist Static Sitting - Comment/# of Minutes: 16 minutes while going through conversion to battery pack and education and teach back/talk through techniques. VCs provided throughout process. Static Standing Balance Static Standing - Balance Support: Bilateral upper extremity supported Static Standing - Level of Assistance: 4: Min assist Static Standing - Comment/# of Minutes:  standing rest break during ambulation and prior to ambulaton (4 minutes)  End of Session PT - End of Session Equipment Utilized During Treatment: Gait belt;Oxygen;Other (comment) (LVAD) Activity Tolerance: Patient limited by fatigue Patient left: in chair;with call bell/phone within reach;with nursing in room Nurse Communication: Mobility status  GP     Fabio Asa 01/14/2013, 3:53 PM Charlotte Crumb, PT DPT  [phone]

## 2013-01-14 NOTE — Progress Notes (Signed)
HeartMate 2 Rounding Note  Subjective:    [name] is a 72 year old gentleman with a history of CABG (2003), Chronic systolic HF EF 32%, S/P AICD (generator change 2011), hyperlipidemia, hypothyroidism, emphysema, OSA, intermittent atrial fibrillation (on coumadin).   Admitted 01/10/13 for optimization before LVAD implant. EF by LV gram 20% 01/07/13  Underwent HMII implant 01/12/13  POD#2 Extubated yesterday and remained on NO through [place]. Stopped lasix gtt last night, CVP 13. Trying to wean levophed off, MAPs 60-70s. Amiodarone stopped and digoxin started, remains in afib RVR. Weight down 4 lbs.   PA 59/23 (32) CVP 13 Co-ox 80.5 INR 1.55  LVAD INTERROGATION:  HeartMate II LVAD:  Flow 4.7 liters/min, speed 9000, power 5.7, PI 5.6.  No PI events  Objective:    Vital Signs:   Temp:  [98.4 F (36.9 C)-100.4 F (38 C)] 98.6 F (37 C) (04/17 0800) Pulse Rate:  [25-133] 38 (04/17 0800) Resp:  [18-32] 20 (04/17 0800) BP: (71-81)/(61-71) 81/68 mmHg (04/16 1550) SpO2:  [86 %-100 %] 100 % (04/17 0800) Arterial Line BP: (50-82)/(39-72) 71/61 mmHg (04/17 0200) FiO2 (%):  [40 %-50 %] 50 % (04/16 1500) Weight:  [109.1 kg (240 lb 8.4 oz)] 109.1 kg (240 lb 8.4 oz) (04/17 0500) Last BM Date: 01/10/13 Mean arterial Pressure 70-80's  Intake/Output:   Intake/Output Summary (Last 24 hours) at 01/14/13 0826 Last data filed at 01/14/13 0800  Gross per 24 hour  Intake 3249.78 ml  Output   3820 ml  Net -570.22 ml     Physical Exam: General: NAD, Lying in bed HEENT: normal Neck: supple. CVP up . Carotids 2+ bilat; no bruits. No lymphadenopathy or thryomegaly appreciated. Cor: Mechanical heart sounds with LVAD hum present. Lungs: diminished in the bases Abdomen: soft, nontender, no distention. No hepatosplenomegaly. No bruits or masses. no bowel sounds. Driveline: Dressing C/D/I and securement device intact Extremities: no cyanosis, clubbing, rash, edema Neuro: alert & orientedx3, cranial  nerves grossly intact. moves all 4 extremities w/o difficulty  Telemetry: Afib 100-110 bpm  Labs: Basic Metabolic Panel:  Recent Labs Lab 01/11/13 0340 01/12/13 0430  01/12/13 1519 01/12/13 1539 01/12/13 2100 01/12/13 2108 01/13/13 0350 01/13/13 1555 01/13/13 1603 01/14/13 0400  NA 136 136  < > 137 140 138  --  137  --  133* 129*  K 4.1 3.9  < > 3.4* 3.3* 3.5  --  4.4  --  3.9 3.7  CL 103 102  --  103  --  104  --  107  --  99 96  CO2 27 28  --  27  --   --   --  25  --   --  28  GLUCOSE 133* 120*  < > 139* 136* 152*  --  133*  --  165* 146*  BUN 13 14  --  12  --  11  --  11  --  7 10  CREATININE 0.84 0.79  --  0.71  --  0.80 0.76 0.74 0.74 0.70 0.63  CALCIUM 9.3 9.4  --  8.3*  --   --   --  8.7  --   --  9.2  MG  --   --   --  1.6  --   --  2.4 2.3 1.9  --  1.9  PHOS  --   --   --   --   --   --   --  1.6*  --   --  2.1*  < > =  values in this interval not displayed.  Liver Function Tests:  Recent Labs Lab 01/10/13 1048 01/13/13 0350  AST 20 42*  ALT 9 10  ALKPHOS 45  --   BILITOT 0.6 1.9*  PROT 6.7  --   ALBUMIN 3.3*  --    No results found for this basename: LIPASE, AMYLASE,  in the last 168 hours No results found for this basename: AMMONIA,  in the last 168 hours  CBC:  Recent Labs Lab 01/12/13 1519 01/12/13 1530  01/12/13 2108 01/13/13 0350 01/13/13 1555 01/13/13 1603 01/14/13 0400  WBC 10.6*  --   --  11.1* 11.7* 13.8*  --  12.4*  NEUTROABS  --   --   --   --  9.1*  --   --  10.2*  HGB 7.6*  --   < > 8.2* 8.5* 8.4* 9.2* 7.4*  HCT 23.5*  --   < > 24.6* 25.2* 25.6* 27.0* 22.6*  MCV 79.9  --   --  79.1 79.0 79.8  --  79.3  PLT 130* 128*  --  123* 126* 127*  --  100*  < > = values in this interval not displayed.  INR:  Recent Labs Lab 01/07/13 1016 01/12/13 1530 01/13/13 0350 01/14/13 0400  INR 1.32 1.38 1.42 1.55*    Other results:  EKG:   Imaging: Dg Chest Port 1 View  01/13/2013  *RADIOLOGY REPORT*  Clinical Data: Ventricular  assist device  PORTABLE CHEST - 1 VIEW  Comparison: Portable exam 0630 hours compared to 01/12/2013  Findings: Left ventricular assist device seen at inferior margin of exam. Tip of endotracheal tube approximately 4.9 cm above carina. Nasogastric tube extends into abdomen. Left subclavian AICD lead again noted. Mediastinal drain and question right basilar thoracostomy tube. Left jugular central venous catheter with tip projecting over left brachiocephalic vein near SVC confluence. Left jugular Swan-Ganz catheter tip projecting over main pulmonary artery. Enlargement of cardiac silhouette. Bibasilar atelectasis, cannot exclude consolidation in the left lower lobe. Lungs clear. No pneumothorax.  IMPRESSION: Support devices as above. Enlargement of cardiac silhouette. Bibasilar atelectasis, cannot exclude left lower lobe consolidation. Compared to previous exam, increased opacification is seen in the left lower lobe.   Original Report Authenticated By: Ulyses Southward, M.D.    Dg Chest Port 1 View  01/12/2013  *RADIOLOGY REPORT*  Clinical Data: 72 year old male status post cardiac ventricular assist device placement.  PORTABLE CHEST - 1 VIEW  Comparison: 01/10/2013.  Findings: Portable supine AP view 1545 hours.  Intubated. Endotracheal tube tip in good position between level of clavicles and carina.  Enteric tube courses to the abdomen, tip not included.  2 new left IJ approach central line are in place.  One of these appear to be a Swan-Ganz catheter which extends to the level of the right atrium and tip at the level of the main pulmonary outflow tract.  A second catheter courses through the inferior cavoatrial junction, tip not included (long arrow).  Right chest tube in place.  A portion of the elbow fat devices visible projecting over the left upper quadrant.  Stable left chest cardiac AICD.  No pneumothorax.  Stable scarring atelectasis at both lung bases. Mild to moderate pulmonary vascular congestion.  Stable  cardiac size and mediastinal contours.  Stable sequelae of CABG.  IMPRESSION: 1.  Two left IJ approach central venous catheters, one with its tip at the level of the main pulmonary outflow tract, the second passes through the inferior cavoatrial junction  directed into the upper abdomen, tip not included. 2.  Right chest tube in place with no pneumothorax identified.  ET tube and NG tube appear per early place. 3.  Increased pulmonary vascular congestion.  L5 device partially visible.   Original Report Authenticated By: Erskine Speed, M.D.      Medications:     Scheduled Medications: . acetaminophen  1,000 mg Oral Q6H   Or  . acetaminophen (TYLENOL) oral liquid 160 mg/5 mL  975 mg Per Tube Q6H  . antiseptic oral rinse  15 mL Mouth Rinse BID  . aspirin EC  325 mg Oral Daily  . bisacodyl  10 mg Oral Daily   Or  . bisacodyl  10 mg Rectal Daily  . budesonide-formoterol  2 puff Inhalation BID  . citalopram  20 mg Oral Daily  . digoxin  0.125 mg Oral Daily  . docusate sodium  200 mg Oral Daily  . furosemide  40 mg Intravenous BID  . insulin aspart  0-24 Units Subcutaneous Q4H  . insulin glargine  12 Units Subcutaneous BID  . levothyroxine  50 mcg Oral QHS  . metoprolol tartrate  12.5 mg Oral BID  . pantoprazole  40 mg Oral Daily  . potassium chloride  10 mEq Intravenous Q1 Hr x 3  . sodium chloride  3 mL Intravenous Q12H  . tiotropium  18 mcg Inhalation Daily  . Warfarin - Pharmacist Dosing Inpatient   Does not apply q1800    Infusions: . sodium chloride    . sodium chloride 20 mL/hr at 01/12/13 1530  . sodium chloride    . DOPamine 2 mcg/kg/min (01/13/13 2000)  . milrinone 0.3 mcg/kg/min (01/14/13 0800)  . nitroGLYCERIN Stopped (01/12/13 1530)  . norepinephrine (LEVOPHED) Adult infusion 4 mcg/min (01/14/13 0800)  . phenylephrine (NEO-SYNEPHRINE) Adult infusion Stopped (01/12/13 1530)    PRN Medications: acetaminophen, midazolam, morphine injection, ondansetron (ZOFRAN) IV,  oxyCODONE, sodium chloride   Assessment:   1) Acute on chronic HF - class IV  2) Chronic AF on coumadin  3) ICM, EF 20%        S/p LVAD Heartmate II implant 01/12/13 4) CAD  S/p CABG 2003  5) COPD with depressed DLCO  CPAP at night  6) HLD  7) Hypothyroidism  8) Expected blood loss anemia 9) Hyponatremia/hypokalemia  Plan/Discussion:    POD #2 Patient extubated yesterday and stable overnight. Looks very good.   Remains on levophed, milrinone, and dopamine. Will wean NO to off. Turn milrinone down to 0.3 and then try to wean levophed off to keep MAP > 60. Will discontinue swan and pleural tube. Insert 2L PICC. Hgb remains low 7.4, will transfuse 1 PRBC today. Volume up. Agree with lasix 40 IV bid. OOB to chair today and try to ambulate.   I reviewed the LVAD parameters from today, and compared the results to the patient's prior recorded data.  No programming changes were made.  The LVAD is functioning within specified parameters.  Nursing performing LVAD self-test daily.  LVAD interrogation was negative for any significant power changes, alarms or PI events/speed drops.  LVAD equipment check completed and is in good working order.  Back-up equipment present.   LVAD education done on emergency procedures and precautions and reviewed exit site care.  Length of Stay: 4  Elenora Hawbaker 01/14/2013, 8:26 AM

## 2013-01-15 ENCOUNTER — Inpatient Hospital Stay (HOSPITAL_COMMUNITY): Payer: Medicare Other

## 2013-01-15 LAB — CBC WITH DIFFERENTIAL/PLATELET
Eosinophils Relative: 1 % (ref 0–5)
Hemoglobin: 8.2 g/dL — ABNORMAL LOW (ref 13.0–17.0)
Lymphocytes Relative: 6 % — ABNORMAL LOW (ref 12–46)
Lymphs Abs: 0.6 10*3/uL — ABNORMAL LOW (ref 0.7–4.0)
MCH: 26.6 pg (ref 26.0–34.0)
MCV: 81.2 fL (ref 78.0–100.0)
Monocytes Relative: 11 % (ref 3–12)
Neutrophils Relative %: 82 % — ABNORMAL HIGH (ref 43–77)
Platelets: 97 10*3/uL — ABNORMAL LOW (ref 150–400)
RBC: 3.08 MIL/uL — ABNORMAL LOW (ref 4.22–5.81)
WBC: 9.8 10*3/uL (ref 4.0–10.5)

## 2013-01-15 LAB — BASIC METABOLIC PANEL
CO2: 31 mEq/L (ref 19–32)
Calcium: 9.3 mg/dL (ref 8.4–10.5)
Glucose, Bld: 119 mg/dL — ABNORMAL HIGH (ref 70–99)
Potassium: 4.2 mEq/L (ref 3.5–5.1)
Sodium: 129 mEq/L — ABNORMAL LOW (ref 135–145)

## 2013-01-15 LAB — URINALYSIS, ROUTINE W REFLEX MICROSCOPIC
Bilirubin Urine: NEGATIVE
Glucose, UA: NEGATIVE mg/dL
Ketones, ur: NEGATIVE mg/dL
Nitrite: NEGATIVE
Protein, ur: 30 mg/dL — AB
Specific Gravity, Urine: 1.015 (ref 1.005–1.030)
Urobilinogen, UA: 0.2 mg/dL (ref 0.0–1.0)
pH: 6 (ref 5.0–8.0)

## 2013-01-15 LAB — CARBOXYHEMOGLOBIN
Carboxyhemoglobin: 2.5 % — ABNORMAL HIGH (ref 0.5–1.5)
Methemoglobin: 0.8 % (ref 0.0–1.5)
O2 Saturation: 72.4 %
Total hemoglobin: 6.6 g/dL — CL (ref 13.5–18.0)
Total hemoglobin: 8.6 g/dL — ABNORMAL LOW (ref 13.5–18.0)

## 2013-01-15 LAB — CBC
HCT: 23.8 % — ABNORMAL LOW (ref 39.0–52.0)
Hemoglobin: 7.8 g/dL — ABNORMAL LOW (ref 13.0–17.0)
MCH: 26.7 pg (ref 26.0–34.0)
MCHC: 32.8 g/dL (ref 30.0–36.0)
MCV: 81.5 fL (ref 78.0–100.0)
Platelets: 92 10*3/uL — ABNORMAL LOW (ref 150–400)
RBC: 2.92 MIL/uL — ABNORMAL LOW (ref 4.22–5.81)
RDW: 15.7 % — ABNORMAL HIGH (ref 11.5–15.5)
WBC: 8.6 10*3/uL (ref 4.0–10.5)

## 2013-01-15 LAB — HEPATIC FUNCTION PANEL
ALT: 11 U/L (ref 0–53)
AST: 30 U/L (ref 0–37)
Albumin: 2.8 g/dL — ABNORMAL LOW (ref 3.5–5.2)
Alkaline Phosphatase: 42 U/L (ref 39–117)
Bilirubin, Direct: 0.3 mg/dL (ref 0.0–0.3)
Indirect Bilirubin: 0.4 mg/dL (ref 0.3–0.9)
Total Bilirubin: 0.7 mg/dL (ref 0.3–1.2)
Total Protein: 5.8 g/dL — ABNORMAL LOW (ref 6.0–8.3)

## 2013-01-15 LAB — TYPE AND SCREEN
ABO/RH(D): O POS
Antibody Screen: NEGATIVE
Unit division: 0

## 2013-01-15 LAB — GLUCOSE, CAPILLARY
Glucose-Capillary: 111 mg/dL — ABNORMAL HIGH (ref 70–99)
Glucose-Capillary: 117 mg/dL — ABNORMAL HIGH (ref 70–99)
Glucose-Capillary: 118 mg/dL — ABNORMAL HIGH (ref 70–99)
Glucose-Capillary: 119 mg/dL — ABNORMAL HIGH (ref 70–99)
Glucose-Capillary: 126 mg/dL — ABNORMAL HIGH (ref 70–99)
Glucose-Capillary: 136 mg/dL — ABNORMAL HIGH (ref 70–99)

## 2013-01-15 LAB — URINE MICROSCOPIC-ADD ON

## 2013-01-15 LAB — PROTIME-INR
INR: 1.33 (ref 0.00–1.49)
Prothrombin Time: 16.2 seconds — ABNORMAL HIGH (ref 11.6–15.2)

## 2013-01-15 LAB — PHOSPHORUS: Phosphorus: 2.4 mg/dL (ref 2.3–4.6)

## 2013-01-15 LAB — MAGNESIUM: Magnesium: 2 mg/dL (ref 1.5–2.5)

## 2013-01-15 LAB — AMYLASE: Amylase: 16 U/L (ref 0–105)

## 2013-01-15 MED ORDER — FE FUMARATE-B12-VIT C-FA-IFC PO CAPS
1.0000 | ORAL_CAPSULE | Freq: Three times a day (TID) | ORAL | Status: DC
  Administered 2013-01-15 – 2013-01-22 (×22): 1 via ORAL
  Filled 2013-01-15 (×26): qty 1

## 2013-01-15 MED ORDER — SORBITOL 70 % PO SOLN
30.0000 mL | Freq: Once | ORAL | Status: AC
  Administered 2013-01-15: 30 mL via ORAL
  Filled 2013-01-15: qty 30

## 2013-01-15 MED ORDER — DEXTROSE 5 % IV SOLN
1.5000 g | Freq: Two times a day (BID) | INTRAVENOUS | Status: DC
  Administered 2013-01-15 – 2013-01-17 (×6): 1.5 g via INTRAVENOUS
  Filled 2013-01-15 (×8): qty 1.5

## 2013-01-15 MED ORDER — METOCLOPRAMIDE HCL 5 MG/ML IJ SOLN
10.0000 mg | Freq: Four times a day (QID) | INTRAMUSCULAR | Status: AC
  Administered 2013-01-15 – 2013-01-17 (×8): 10 mg via INTRAVENOUS
  Filled 2013-01-15 (×8): qty 2

## 2013-01-15 MED ORDER — FUROSEMIDE 10 MG/ML IJ SOLN
40.0000 mg | Freq: Three times a day (TID) | INTRAMUSCULAR | Status: DC
Start: 2013-01-15 — End: 2013-01-15

## 2013-01-15 MED ORDER — METOPROLOL TARTRATE 12.5 MG HALF TABLET
12.5000 mg | ORAL_TABLET | Freq: Two times a day (BID) | ORAL | Status: DC
  Administered 2013-01-15 – 2013-01-16 (×3): 12.5 mg via ORAL
  Filled 2013-01-15 (×6): qty 1

## 2013-01-15 MED ORDER — WARFARIN SODIUM 5 MG PO TABS
5.0000 mg | ORAL_TABLET | Freq: Once | ORAL | Status: AC
  Administered 2013-01-15: 5 mg via ORAL
  Filled 2013-01-15: qty 1

## 2013-01-15 MED ORDER — CEFUROXIME SODIUM 1.5 G IJ SOLR
1.5000 g | Freq: Two times a day (BID) | INTRAMUSCULAR | Status: DC
  Filled 2013-01-15 (×2): qty 1.5

## 2013-01-15 MED ORDER — FUROSEMIDE 10 MG/ML IJ SOLN
40.0000 mg | Freq: Two times a day (BID) | INTRAMUSCULAR | Status: DC
  Administered 2013-01-15 – 2013-01-18 (×7): 40 mg via INTRAVENOUS
  Filled 2013-01-15 (×7): qty 4

## 2013-01-15 MED ORDER — PROMETHAZINE HCL 25 MG/ML IJ SOLN
12.5000 mg | Freq: Four times a day (QID) | INTRAMUSCULAR | Status: DC | PRN
  Administered 2013-01-15: 12.5 mg via INTRAVENOUS
  Filled 2013-01-15: qty 1

## 2013-01-15 NOTE — Progress Notes (Signed)
RT Note: Pt states he still does not want a CPAP at this time and has still been nauseated most of the day. I told him to let us know if he changes his mind. RT will continue to monitor

## 2013-01-15 NOTE — Progress Notes (Signed)
HeartMate 2 Rounding Note  Subjective:    [name] is a 72 year old gentleman with a history of CABG (2003), Chronic systolic HF EF 32%, S/P AICD (generator change 2011), hyperlipidemia, hypothyroidism, emphysema, OSA, intermittent atrial fibrillation (on coumadin). Quit smoking 2003. Uses CPAP and O2 every night. He is a disabled [employer]. He was referred to the AHF clinic by Dr. Elease Hashimoto and over the past couple of months his functional capacity has really declined. He has class IV heart failure symptoms and has not improved on OMM. His CPX in 11/13 showed a VO2 of 9.2%, VE/VCO2- 52.8, OUES 1.15 and RER= 1.02. We have been following him closely and have had multiple discussions about his options and the patient decided that he wanted to proceed with LVAD placement. I discussed the patient with Dr Romona Curls at [facility] who agreed that the patient was an acceptable DT VAD candidate- his pulm hx of asbestosis, low DLCO are a problem for transplant candidacy  Admitted 01/10/13 for optimization before LVAD implant. EF by LV gram 20% 01/07/13  POD#3  walked 50 ft w/ PT yesterday Cont on low dose Mil, dopa with good hemodynamics Controlled afib Wt down 3 lbs Hb 8.2 post transfusion- still sig serosang MT pocket drainage No PI events, good pump flow on 9000RPM Nausea- start reglan, phenergan  CVP -10-12 Co-ox pending INR 1.35  LVAD INTERROGATION:  HeartMate II LVAD:  Flow 4.7 liters/min, speed 9000, power 5.7, PI 5.6.  No PI events  Objective:    Vital Signs:   Temp:  [97.5 F (36.4 C)-98.6 F (37 C)] 97.5 F (36.4 C) (04/18 0738) Pulse Rate:  [26-160] 66 (04/18 0700) Resp:  [11-29] 18 (04/18 0700) SpO2:  [84 %-100 %] 94 % (04/18 0700) Weight:  [238 lb 8.6 oz (108.2 kg)] 238 lb 8.6 oz (108.2 kg) (04/18 0500) Last BM Date: 01/10/13 Mean arterial Pressure 70-80's  Intake/Output:   Intake/Output Summary (Last 24 hours) at 01/15/13 0805 Last data filed at 01/15/13 0700  Gross per 24 hour   Intake 1345.5 ml  Output   2100 ml  Net -754.5 ml     Physical Exam: General: NAD, Laying in bed HEENT: normal Neck: supple. CVP 8-9 . Carotids 2+ bilat; no bruits. No lymphadenopathy or thryomegaly appreciated. Cor: Mechanical heart sounds with LVAD hum present. Lungs: diminished in the bases Abdomen: soft, nontender, no distention. No hepatosplenomegaly. No bruits or masses. no bowel sounds. Driveline: Dressing C/D/I and securement device intact Extremities: no cyanosis, clubbing, rash, edema Neuro: alert & orientedx3, cranial nerves grossly intact. moves all 4 extremities w/o difficulty  Telemetry: Afib 103 bpm  Labs: Basic Metabolic Panel:  Recent Labs Lab 01/12/13 0430  01/12/13 1519  01/12/13 2100 01/12/13 2108 01/13/13 0350 01/13/13 1555 01/13/13 1603 01/14/13 0400 01/14/13 1837 01/15/13 0400  NA 136  < > 137  < > 138  --  137  --  133* 129* 132* 129*  K 3.9  < > 3.4*  < > 3.5  --  4.4  --  3.9 3.7 4.1 4.2  CL 102  --  103  --  104  --  107  --  99 96 93* 95*  CO2 28  --  27  --   --   --  25  --   --  28  --  31  GLUCOSE 120*  < > 139*  < > 152*  --  133*  --  165* 146* 173* 119*  BUN 14  --  12  --  11  --  11  --  7 10 12 17   CREATININE 0.79  --  0.71  --  0.80 0.76 0.74 0.74 0.70 0.63 0.70 0.67  CALCIUM 9.4  --  8.3*  --   --   --  8.7  --   --  9.2  --  9.3  MG  --   < > 1.6  --   --  2.4 2.3 1.9  --  1.9  --  2.0  PHOS  --   --   --   --   --   --  1.6*  --   --  2.1*  --  2.4  < > = values in this interval not displayed.  Liver Function Tests:  Recent Labs Lab 01/10/13 1048 01/13/13 0350  AST 20 42*  ALT 9 10  ALKPHOS 45  --   BILITOT 0.6 1.9*  PROT 6.7  --   ALBUMIN 3.3*  --    No results found for this basename: LIPASE, AMYLASE,  in the last 168 hours No results found for this basename: AMMONIA,  in the last 168 hours  CBC:  Recent Labs Lab 01/12/13 2108 01/13/13 0350 01/13/13 1555 01/13/13 1603 01/14/13 0400 01/14/13 1837  01/15/13 0400  WBC 11.1* 11.7* 13.8*  --  12.4*  --  9.8  NEUTROABS  --  9.1*  --   --  10.2*  --  8.1*  HGB 8.2* 8.5* 8.4* 9.2* 7.4* 8.8* 8.2*  HCT 24.6* 25.2* 25.6* 27.0* 22.6* 26.0* 25.0*  MCV 79.1 79.0 79.8  --  79.3  --  81.2  PLT 123* 126* 127*  --  100*  --  97*    INR:  Recent Labs Lab 01/12/13 1530 01/13/13 0350 01/14/13 0400 01/15/13 0400  INR 1.38 1.42 1.55* 1.33    Other results:  EKG:   Imaging: Dg Chest Port 1 View  01/14/2013  *RADIOLOGY REPORT*  Clinical Data: Line placement.  PORTABLE CHEST - 1 VIEW  Comparison: Portable chest 01/14/2013 at 6:26 a.m.  Findings: The patient has a new right PICC with the tip projecting over the lower superior vena cava.  Support apparatus is otherwise unchanged.  There is cardiomegaly and vascular congestion. Bibasilar atelectasis is noted.  Likely small left effusion is seen.  IMPRESSION:  1.  Tip of right PICC projects over the lower superior vena cava. 2.  Cardiomegaly and vascular congestion. 3.  Bibasilar atelectasis and small left effusion.   Original Report Authenticated By: Holley Dexter, M.D.    Dg Chest Port 1 View  01/14/2013  *RADIOLOGY REPORT*  Clinical Data: Left ventricular assist device  PORTABLE CHEST - 1 VIEW  Comparison: Portable exam 0626 hours compared to 01/13/2013  Findings: Previously seen endotracheal tube no longer identified. Left ventricular assist leads again identified. Left subclavian AICD leads project over right atrium and right ventricle. Left jugular lines stable. Right basilar chest tube.  Enlargement of cardiac silhouette post CABG. Pulmonary vascular congestion. Mild perihilar infiltrates question edema. Extensive grid artifacts. No gross pneumothorax.  IMPRESSION: Support devices as above. Enlargement of cardiac silhouette with pulmonary vascular congestion and mild perihilar infiltrates question edema.   Original Report Authenticated By: Ulyses Southward, M.D.      Medications:     Scheduled  Medications: . acetaminophen  1,000 mg Oral Q6H  . antiseptic oral rinse  15 mL Mouth Rinse BID  . aspirin EC  325 mg Oral Daily  .  bisacodyl  10 mg Oral Daily   Or  . bisacodyl  10 mg Rectal Daily  . budesonide-formoterol  2 puff Inhalation BID  . cefUROXime  1.5 g Intravenous Q12H  . citalopram  20 mg Oral Daily  . digoxin  0.125 mg Oral Daily  . docusate sodium  200 mg Oral Daily  . ferrous fumarate-b12-vitamic C-folic acid  1 capsule Oral TID PC  . furosemide  40 mg Intravenous BID  . insulin aspart  0-24 Units Subcutaneous Q4H  . insulin glargine  12 Units Subcutaneous BID  . levothyroxine  50 mcg Oral QHS  . metoCLOPramide (REGLAN) injection  10 mg Intravenous Q6H  . metoprolol tartrate  12.5 mg Oral BID  . pantoprazole  40 mg Oral Daily  . sodium chloride  10-40 mL Intracatheter Q12H  . sodium chloride  3 mL Intravenous Q12H  . tiotropium  18 mcg Inhalation Daily  . Warfarin - Pharmacist Dosing Inpatient   Does not apply q1800    Infusions: . sodium chloride    . sodium chloride    . DOPamine 2 mcg/kg/min (01/13/13 2000)  . milrinone 0.3 mcg/kg/min (01/14/13 1000)  . nitroGLYCERIN Stopped (01/12/13 1530)    PRN Medications: acetaminophen, midazolam, morphine injection, oxyCODONE, promethazine, sodium chloride, sodium chloride, traMADol   Assessment:   1) Acute on chronic HF - class IV  2) Chronic AF on coumadin  3) ICM, EF 20%        S/p LVAD Heartmate II implant 01/12/13 4) CAD  S/p CABG 2003  5) COPD with depressed DLCO  CPAP at night  6) HLD  7) Hypothyroidism  8) Expected blood loss anemia- LDH increased prob due to banked blood breakdown but will follow  Plan/Discussion:    POD #3 Cont PT, diuresis, slow inotrope wean, cont coumadin but hold heparin bridge due to redo w/ persistent drain output Check U/A with rising LDH and dark urine Check amylase, start reglan and phenergan for nausea   Pump parameters and equipment checked and all is in order  including backup equipment Length of Stay: 5  VAN TRIGT III,Eual Lindstrom 01/15/2013, 8:05 AM

## 2013-01-15 NOTE — Progress Notes (Signed)
[facility] BRIEF SICU PROGRESS NOTE  3 Days Post-Op  S/P Procedure(s) (LRB): INSERTION OF IMPLANTABLE LEFT VENTRICULAR ASSIST DEVICE (N/A) INTRAOPERATIVE TRANSESOPHAGEAL ECHOCARDIOGRAM (N/A)   Stable day Afib w/ stable VAD flows, MAP Diuresing some  Plan: Continue current plan  Angelo Caroll H 01/15/2013 5:14 PM

## 2013-01-15 NOTE — Progress Notes (Signed)
Physical Therapy Treatment Patient Details Name: [name] MRN: [id number] DOB: 09-14-1941 Today's Date: 01/15/2013 Time: 0829-0901 PT Time Calculation (min): 32 min  PT Assessment / Plan / Recommendation Comments on Treatment Session  Pt pleasent but limited in activity secondary to medication for nausea. Patient with similar functional mobility in comparison to yesterday. Anticipate patient activity will improve.  Will continue to see and progress activity as tolerated.    Follow Up Recommendations  Home health PT;Supervision/Assistance - 24 hour     Does the patient have the potential to tolerate intense rehabilitation     Barriers to Discharge        Equipment Recommendations  Other (comment) (Rollator)    Recommendations for Other Services    Frequency Min 5X/week   Plan Discharge plan remains appropriate    Precautions / Restrictions Restrictions Weight Bearing Restrictions:  (sternal precautions apply)   Pertinent Vitals/Pain "i'm feelin no pain"; Vitals stable    Mobility  Bed Mobility Supine to Sit: 1: +2 Total assist Supine to Sit: Patient Percentage: 60% Sitting - Scoot to Edge of Bed: 3: Mod assist Details for Bed Mobility Assistance: Assist needed to maintain precautions, VCs for positioning and technique Transfers Transfers: Sit to Stand;Stand to Sit Sit to Stand: 3: Mod assist;Without upper extremity assist;From bed Stand to Sit: 3: Mod assist;Without upper extremity assist;To chair/3-in-1 Details for Transfer Assistance: VCs for sternal precautions; assist to elevate trunk and control descent while complying with precautions Ambulation/Gait Ambulation/Gait Assistance: 4: Min assist Ambulation Distance (Feet): 18 Feet Assistive device: Other (Comment) (held onto wheelchair) Ambulation/Gait Assistance Details: Patient back in forth in room ambulation 6 steps x 3, some side stepping. Limited ambulaton mainly due to medication (finergrin) Gait Pattern:  Step-to pattern;Shuffle;Trunk flexed;Narrow base of support Gait velocity: decreased      PT Goals Acute Rehab PT Goals PT Goal Formulation: With patient Time For Goal Achievement: 01/28/13 Potential to Achieve Goals: Good Pt will go Supine/Side to Sit: with modified independence PT Goal: Supine/Side to Sit - Progress: Progressing toward goal Pt will go Sit to Supine/Side: with modified independence PT Goal: Sit to Supine/Side - Progress: Progressing toward goal Pt will go Sit to Stand: with modified independence PT Goal: Sit to Stand - Progress: Progressing toward goal Pt will go Stand to Sit: with modified independence PT Goal: Stand to Sit - Progress: Progressing toward goal Pt will Ambulate: >150 feet;with modified independence PT Goal: Ambulate - Progress: Progressing toward goal  Visit Information  Last PT Received On: 01/15/13    Subjective Data  Subjective: I'm very sleepy Patient Stated Goal: to go home      Balance  Balance Balance Assessed: Yes Static Sitting Balance Static Sitting - Balance Support: Feet supported Static Sitting - Level of Assistance: 3: Mod assist Static Sitting - Comment/# of Minutes: 6 minutes Static Standing Balance Static Standing - Balance Support: Bilateral upper extremity supported Static Standing - Level of Assistance: 4: Min assist Static Standing - Comment/# of Minutes: 4 minutes  End of Session PT - End of Session Equipment Utilized During Treatment: Gait belt;Oxygen;Other (comment) (LVAD) Activity Tolerance: Patient limited by fatigue Patient left: in bed;with call bell/phone within reach;with nursing in room Nurse Communication: Mobility status   GP     Fabio Asa 01/15/2013, 10:26 AM Charlotte Crumb, PT DPT  [phone]

## 2013-01-15 NOTE — Progress Notes (Addendum)
ANTICOAGULATION CONSULT NOTE   Pharmacy Consult for warfarin  Indication: atrial fibrillation and LVAD placed 4/15  No Known Allergies  Patient Measurements: Height: 6' (182.9 cm) Weight: 238 lb 8.6 oz (108.2 kg) IBW/kg (Calculated) : 77.6  Vital Signs: Temp: 97.5 F (36.4 C) (04/18 0738) Temp src: Oral (04/18 0738) Pulse Rate: 82 (04/18 0900)  Labs:  Recent Labs  01/12/13 1519  01/12/13 1530  01/13/13 0350 01/13/13 1555  01/14/13 0400 01/14/13 1837 01/15/13 0400  HGB 7.6*  --   --   < > 8.5* 8.4*  < > 7.4* 8.8* 8.2*  HCT 23.5*  --   --   < > 25.2* 25.6*  < > 22.6* 26.0* 25.0*  PLT 130*  --  128*  < > 126* 127*  --  100*  --  97*  APTT  --   --  36  --   --   --   --   --   --   --   LABPROT  --   < > 16.6*  --  17.0*  --   --  18.1*  --  16.2*  INR  --   < > 1.38  --  1.42  --   --  1.55*  --  1.33  CREATININE 0.71  --   --   < > 0.74 0.74  < > 0.63 0.70 0.67  < > = values in this interval not displayed.  Estimated Creatinine Clearance: 107.6 ml/min (by C-G formula based on Cr of 0.67).  Medications:  Warfarin 5mg  daily except 2.5mg  on Thursday  Assessment: 72 year old male now POD#3 from LVAD. Patient was on coumadin prior to admission for afib, was on heparin bridge prior to surgery.  INR currently down to 1.33 possibly due to receiving blood. No overt bleeding noted post-op. Hgb still not stabilizing 8.8>8.2 this morning. He is now off amiodarone, which may be reason he appeared more sensitive over the past few days and contributed to drop in INR overnight. Will give 5mg  tonight.  Will provide warfarin re-education once transferred to floor.  Orders received this am to continue zinacef until pocket drain is removed.  Goal of Therapy:  INR 2-3 Monitor platelets by anticoagulation protocol: Yes   Plan:  Warfarin 5mg  tonight Daily INR for now Follow results of u/a  Sheppard Coil PharmD., BCPS Clinical Pharmacist Pager [phone] 01/15/2013 9:33 AM

## 2013-01-15 NOTE — Progress Notes (Signed)
HeartMate 2 Rounding Note  Subjective:    [name] is a 72 year old gentleman with a history of CABG (2003), Chronic systolic HF EF 20%, S/P AICD (generator change 2011), hyperlipidemia, hypothyroidism, emphysema, OSA, intermittent atrial fibrillation (on coumadin).   Admitted 01/10/13 for optimization before LVAD implant. EF by LV gram 20% 01/07/13  Underwent HMII implant 01/12/13  POD#3 Vitals stable overnight. Biggest issue is persistent nausea. Walked 50 ft with PT yesterday. Remians on low dose milrinone 0.2 and dopamine. MAPs 60-70s. Afib more controlled 90-100s. Weight down another 2 lbs. 2 L PICC inserted yesterday. Received 1 PRBC, Hgb now 8.2, continue to monitor. CT out 270.  INR 1.33 LDH 358 Co-ox 72%  LVAD INTERROGATION:  HeartMate II LVAD:  Flow 5.2 liters/min, speed 9000, power 5.4, PI 6.2.  No PI events  Objective:    Vital Signs:   Temp:  [97.5 F (36.4 C)-98.6 F (37 C)] 97.5 F (36.4 C) (04/18 0738) Pulse Rate:  [26-160] 66 (04/18 0700) Resp:  [11-29] 18 (04/18 0700) SpO2:  [84 %-100 %] 94 % (04/18 0700) Weight:  [238 lb 8.6 oz (108.2 kg)] 238 lb 8.6 oz (108.2 kg) (04/18 0500) Last BM Date: 01/10/13 Mean arterial Pressure 60-70s  Intake/Output:   Intake/Output Summary (Last 24 hours) at 01/15/13 0840 Last data filed at 01/15/13 0700  Gross per 24 hour  Intake 1345.5 ml  Output   2100 ml  Net -754.5 ml     Physical Exam: General: NAD, Lying in bed; lethargic HEENT: normal Neck: supple. CVP 12-13 . Carotids 2+ bilat; no bruits. No lymphadenopathy or thryomegaly appreciated. Cor: Mechanical heart sounds with LVAD hum present. Lungs: diminished in the bases Abdomen: soft, nontender, no distention. No hepatosplenomegaly. No bruits or masses. Minimal bowel sounds. Driveline: Dressing C/D/I and securement device intact Extremities: no cyanosis, clubbing, rash, edema Neuro: alert & orientedx3, cranial nerves grossly intact. moves all 4 extremities w/o  difficulty  Telemetry: Afib 90-100 bpm  Labs: Basic Metabolic Panel:  Recent Labs Lab 01/12/13 0430  01/12/13 1519  01/12/13 2100 01/12/13 2108 01/13/13 0350 01/13/13 1555 01/13/13 1603 01/14/13 0400 01/14/13 1837 01/15/13 0400  NA 136  < > 137  < > 138  --  137  --  133* 129* 132* 129*  K 3.9  < > 3.4*  < > 3.5  --  4.4  --  3.9 3.7 4.1 4.2  CL 102  --  103  --  104  --  107  --  99 96 93* 95*  CO2 28  --  27  --   --   --  25  --   --  28  --  31  GLUCOSE 120*  < > 139*  < > 152*  --  133*  --  165* 146* 173* 119*  BUN 14  --  12  --  11  --  11  --  7 10 12 17   CREATININE 0.79  --  0.71  --  0.80 0.76 0.74 0.74 0.70 0.63 0.70 0.67  CALCIUM 9.4  --  8.3*  --   --   --  8.7  --   --  9.2  --  9.3  MG  --   < > 1.6  --   --  2.4 2.3 1.9  --  1.9  --  2.0  PHOS  --   --   --   --   --   --  1.6*  --   --  2.1*  --  2.4  < > = values in this interval not displayed.  Liver Function Tests:  Recent Labs Lab 01/10/13 1048 01/13/13 0350  AST 20 42*  ALT 9 10  ALKPHOS 45  --   BILITOT 0.6 1.9*  PROT 6.7  --   ALBUMIN 3.3*  --    No results found for this basename: LIPASE, AMYLASE,  in the last 168 hours No results found for this basename: AMMONIA,  in the last 168 hours  CBC:  Recent Labs Lab 01/12/13 2108 01/13/13 0350 01/13/13 1555 01/13/13 1603 01/14/13 0400 01/14/13 1837 01/15/13 0400  WBC 11.1* 11.7* 13.8*  --  12.4*  --  9.8  NEUTROABS  --  9.1*  --   --  10.2*  --  8.1*  HGB 8.2* 8.5* 8.4* 9.2* 7.4* 8.8* 8.2*  HCT 24.6* 25.2* 25.6* 27.0* 22.6* 26.0* 25.0*  MCV 79.1 79.0 79.8  --  79.3  --  81.2  PLT 123* 126* 127*  --  100*  --  97*    INR:  Recent Labs Lab 01/12/13 1530 01/13/13 0350 01/14/13 0400 01/15/13 0400  INR 1.38 1.42 1.55* 1.33    Other results:  EKG:   Imaging: Dg Chest Port 1 View  01/15/2013  *RADIOLOGY REPORT*  Clinical Data: Left ventricular assist device placement.  Ischemic cardiomyopathy.  PORTABLE CHEST - 1 VIEW   Comparison: 01/14/2013.  Findings: 0634 hours.  Left IJ central venous catheters, right arm PICC and left subclavian AICD leads appear unchanged; the latter are incompletely visualized.  Likewise, the left ventricular assist device is incompletely visualized.  There is stable cardiomegaly status post CABG.  Bibasilar atelectasis is present with possible mild edema.  The lung bases are incompletely visualized.  There are possible small bilateral pleural effusions.  No pneumothorax is identified.  IMPRESSION:  1.  The visualized support system is unchanged. 2.  Suspected mild increase in pulmonary edema.  Possible small pleural effusions.   Original Report Authenticated By: Carey Bullocks, M.D.    Dg Chest Port 1 View  01/14/2013  *RADIOLOGY REPORT*  Clinical Data: Line placement.  PORTABLE CHEST - 1 VIEW  Comparison: Portable chest 01/14/2013 at 6:26 a.m.  Findings: The patient has a new right PICC with the tip projecting over the lower superior vena cava.  Support apparatus is otherwise unchanged.  There is cardiomegaly and vascular congestion. Bibasilar atelectasis is noted.  Likely small left effusion is seen.  IMPRESSION:  1.  Tip of right PICC projects over the lower superior vena cava. 2.  Cardiomegaly and vascular congestion. 3.  Bibasilar atelectasis and small left effusion.   Original Report Authenticated By: Holley Dexter, M.D.    Dg Chest Port 1 View  01/14/2013  *RADIOLOGY REPORT*  Clinical Data: Left ventricular assist device  PORTABLE CHEST - 1 VIEW  Comparison: Portable exam 0626 hours compared to 01/13/2013  Findings: Previously seen endotracheal tube no longer identified. Left ventricular assist leads again identified. Left subclavian AICD leads project over right atrium and right ventricle. Left jugular lines stable. Right basilar chest tube.  Enlargement of cardiac silhouette post CABG. Pulmonary vascular congestion. Mild perihilar infiltrates question edema. Extensive grid artifacts. No  gross pneumothorax.  IMPRESSION: Support devices as above. Enlargement of cardiac silhouette with pulmonary vascular congestion and mild perihilar infiltrates question edema.   Original Report Authenticated By: Ulyses Southward, M.D.      Medications:     Scheduled Medications: . acetaminophen  1,000 mg Oral  Q6H  . antiseptic oral rinse  15 mL Mouth Rinse BID  . aspirin EC  325 mg Oral Daily  . bisacodyl  10 mg Oral Daily   Or  . bisacodyl  10 mg Rectal Daily  . budesonide-formoterol  2 puff Inhalation BID  . cefUROXime (ZINACEF)  IV  1.5 g Intravenous Q12H  . citalopram  20 mg Oral Daily  . docusate sodium  200 mg Oral Daily  . ferrous fumarate-b12-vitamic C-folic acid  1 capsule Oral TID PC  . furosemide  40 mg Intravenous TID  . insulin aspart  0-24 Units Subcutaneous Q4H  . insulin glargine  12 Units Subcutaneous BID  . levothyroxine  50 mcg Oral QHS  . metoCLOPramide (REGLAN) injection  10 mg Intravenous Q6H  . metoprolol tartrate  12.5 mg Oral BID  . pantoprazole  40 mg Oral Daily  . sodium chloride  10-40 mL Intracatheter Q12H  . sodium chloride  3 mL Intravenous Q12H  . tiotropium  18 mcg Inhalation Daily  . Warfarin - Pharmacist Dosing Inpatient   Does not apply q1800    Infusions: . sodium chloride    . sodium chloride    . DOPamine 2 mcg/kg/min (01/13/13 2000)  . milrinone 0.3 mcg/kg/min (01/14/13 1000)  . nitroGLYCERIN Stopped (01/12/13 1530)    PRN Medications: acetaminophen, midazolam, morphine injection, oxyCODONE, promethazine, sodium chloride, sodium chloride, traMADol   Assessment:   1) Acute on chronic HF - class IV  2) Chronic AF on coumadin  3) ICM, EF 20%        S/p LVAD Heartmate II implant 01/12/13 4) CAD  S/p CABG 2003  5) COPD with depressed DLCO  CPAP at night  6) HLD  7) Hypothyroidism  8) Expected blood loss anemia 9) Hyponatremia/hypokalemia  Plan/Discussion:    POD #3 Stable overnight. Having persistent nausea, agree with starting  phenegran and reglan.    Off of levophed, remains on low dose milrinone (currently weaning, Co-ox 72) and dopamine. Keep MAP > 60. Will d/c digoxin afib more controlled with metoprolol. Continue to trend Hgb, may need another unit. Will talk with pharmacy to dose coumadin today. Weight still remains elevated along with CVP, will increase lasix to TID for today - likely cut back tomorrow. Stop DA. Wean milrinone to 0.125 if OK with Dr. Donata Clay.  OOB to chair and ambulate. Continue Reglan to help GI mobility. Will give 1 dose of sorbitol.  I reviewed the LVAD parameters from today, and compared the results to the patient's prior recorded data.  No programming changes were made.  The LVAD is functioning within specified parameters.  Nursing performing LVAD self-test daily.  LVAD interrogation was negative for any significant power changes, alarms or PI events/speed drops.  LVAD equipment check completed and is in good working order.  Back-up equipment present.   LVAD education done on emergency procedures and precautions and reviewed exit site care.  Length of Stay: 5  Aundria Rud 01/15/2013, 8:40 AM  Patient seen and examined with Ulla Potash, NP. We discussed all aspects of the encounter. I agree with the assessment and plan as stated above.   Doing very well. Agree with weaning DA and milrinone. Continue diuresis. Will continue Reglan and give 1 dose of sorbitol.   Device parameters look very good.  Troyce Gieske,MD 1:57 PM

## 2013-01-15 NOTE — Progress Notes (Signed)
Occupational Therapy Treatment Patient Details Name: [name] MRN: [id number] DOB: Feb 08, 1941 Today's Date: 01/15/2013 Time: 0829-0901 OT Time Calculation (min): 32 min  OT Assessment / Plan / Recommendation Comments on Treatment Session Session somwhat limited due to pt lethargic due to nausea meds.  Anticipate that pt will make steady progress.    Follow Up Recommendations  Home health OT;Supervision/Assistance - 24 hour    Barriers to Discharge       Equipment Recommendations   (TBD)    Recommendations for Other Services    Frequency Min 3X/week   Plan Discharge plan remains appropriate    Precautions / Restrictions Precautions Precautions: Sternal Precaution Comments: LVAD Required Braces or Orthoses: Other Brace/Splint Other Brace/Splint: LVAD black bag at all times Restrictions Weight Bearing Restrictions:  (sternal precautions apply)   Pertinent Vitals/Pain Pt denying pain but experiencing nausea (RN recently administered meds for nausea).    ADL  Toilet Transfer: Simulated;+2 Total assistance Toilet Transfer: Patient Percentage: 60% Toilet Transfer Method: Sit to Barista:  (bed) Equipment Used: Wheelchair Transfers/Ambulation Related to ADLs: +2 total assist for ambulating at bedside.  Pt took several steps along side of bed.   ADL Comments: Session limited today by pt fatigue and lethargy (due to nausea meds). Did not address teach back of LVAD equipment today since focus of session was on basic mobility at bedside and did not need to switch to battery power.  Pt too lethargic to focus on education regarding manipulation of LVAD equipment.    OT Diagnosis:    OT Problem List:   OT Treatment Interventions:     OT Goals ADL Goals Pt Will Transfer to Toilet: with supervision;Ambulation;with DME;Comfort height toilet ADL Goal: Toilet Transfer - Progress: Progressing toward goals Miscellaneous OT Goals Miscellaneous OT Goal #1: Pt  will perform bed mobility with supervision as precursor for EOB ADLs. OT Goal: Miscellaneous Goal #1 - Progress: Progressing toward goals  Visit Information  Last OT Received On: 01/15/13 Assistance Needed: +2 PT/OT Co-Evaluation/Treatment: Yes    Subjective Data      Prior Functioning       Cognition  Cognition Arousal/Alertness: Lethargic Behavior During Therapy: WFL for tasks assessed/performed Overall Cognitive Status: Within Functional Limits for tasks assessed    Mobility  Bed Mobility Bed Mobility: Supine to Sit;Sitting - Scoot to Edge of Bed;Sit to Supine Supine to Sit: 1: +2 Total assist Supine to Sit: Patient Percentage: 60% Sitting - Scoot to Edge of Bed: 3: Mod assist Details for Bed Mobility Assistance: Assist needed to maintain precautions, VCs for positioning and technique Transfers Transfers: Sit to Stand;Stand to Sit Sit to Stand: 3: Mod assist;Without upper extremity assist;From bed Stand to Sit: 3: Mod assist;Without upper extremity assist;To chair/3-in-1 Details for Transfer Assistance: VCs for sternal precautions; assist to elevate trunk and control descent while complying with precautions    Exercises      Balance Balance Balance Assessed: Yes Static Sitting Balance Static Sitting - Balance Support: Feet supported Static Sitting - Level of Assistance: 3: Mod assist Static Sitting - Comment/# of Minutes: 6 minutes Static Standing Balance Static Standing - Balance Support: Bilateral upper extremity supported Static Standing - Level of Assistance: 4: Min assist Static Standing - Comment/# of Minutes: 4 minutes   End of Session OT - End of Session Equipment Utilized During Treatment: Other (comment) (w/c for UE support in standing) Activity Tolerance: Patient limited by fatigue Patient left: in bed;with call bell/phone within reach Nurse Communication: Mobility  status  GO    01/15/2013 Cipriano Mile OTR/L Pager [phone] Office  [phone]  Cipriano Mile 01/15/2013, 10:30 AM

## 2013-01-16 ENCOUNTER — Inpatient Hospital Stay (HOSPITAL_COMMUNITY): Payer: Medicare Other

## 2013-01-16 LAB — BASIC METABOLIC PANEL
BUN: 27 mg/dL — ABNORMAL HIGH (ref 6–23)
Creatinine, Ser: 0.72 mg/dL (ref 0.50–1.35)
GFR calc Af Amer: >90 mL/min (ref >90)
GFR calc non Af Amer: >90 mL/min (ref >90)

## 2013-01-16 LAB — CBC WITH DIFFERENTIAL/PLATELET
Basophils Absolute: 0 10*3/uL (ref 0.0–0.1)
Basophils Relative: 0 % (ref 0–1)
HCT: 24.7 % — ABNORMAL LOW (ref 39.0–52.0)
MCHC: 31.6 g/dL (ref 30.0–36.0)
Monocytes Absolute: 0.9 10*3/uL (ref 0.1–1.0)
Neutro Abs: 6.8 10*3/uL (ref 1.7–7.7)
RDW: 16.2 % — ABNORMAL HIGH (ref 11.5–15.5)

## 2013-01-16 LAB — GLUCOSE, CAPILLARY
Glucose-Capillary: 101 mg/dL — ABNORMAL HIGH (ref 70–99)
Glucose-Capillary: 106 mg/dL — ABNORMAL HIGH (ref 70–99)
Glucose-Capillary: 111 mg/dL — ABNORMAL HIGH (ref 70–99)
Glucose-Capillary: 122 mg/dL — ABNORMAL HIGH (ref 70–99)
Glucose-Capillary: 126 mg/dL — ABNORMAL HIGH (ref 70–99)
Glucose-Capillary: 97 mg/dL (ref 70–99)

## 2013-01-16 LAB — URINE CULTURE
Colony Count: NO GROWTH
Culture: NO GROWTH

## 2013-01-16 LAB — PROTIME-INR: Prothrombin Time: 15.3 seconds — ABNORMAL HIGH (ref 11.6–15.2)

## 2013-01-16 LAB — CARBOXYHEMOGLOBIN
Carboxyhemoglobin: 2.1 % — ABNORMAL HIGH (ref 0.5–1.5)
Methemoglobin: 0.9 % (ref 0.0–1.5)

## 2013-01-16 MED ORDER — POTASSIUM CHLORIDE 10 MEQ/50ML IV SOLN
10.0000 meq | INTRAVENOUS | Status: AC
  Administered 2013-01-16: 10 meq via INTRAVENOUS

## 2013-01-16 MED ORDER — WARFARIN SODIUM 7.5 MG PO TABS
7.5000 mg | ORAL_TABLET | Freq: Once | ORAL | Status: AC
  Administered 2013-01-16: 7.5 mg via ORAL
  Filled 2013-01-16: qty 1

## 2013-01-16 MED ORDER — POTASSIUM CHLORIDE 10 MEQ/50ML IV SOLN
INTRAVENOUS | Status: AC
  Filled 2013-01-16: qty 150

## 2013-01-16 MED ORDER — POTASSIUM CHLORIDE 10 MEQ/50ML IV SOLN
10.0000 meq | INTRAVENOUS | Status: AC | PRN
  Administered 2013-01-16 (×3): 10 meq via INTRAVENOUS
  Filled 2013-01-16: qty 150

## 2013-01-16 MED ORDER — ENSURE PUDDING PO PUDG
1.0000 | Freq: Three times a day (TID) | ORAL | Status: DC
  Administered 2013-01-16 – 2013-01-17 (×4): 1 via ORAL

## 2013-01-16 NOTE — Progress Notes (Addendum)
Physical Therapy Treatment Patient Details Name: [name] MRN: [id number] DOB: 11-13-1940 Today's Date: 01/16/2013 Time: 4098-1191 PT Time Calculation (min): 33 min  PT Assessment / Plan / Recommendation Comments on Treatment Session  Pt s/p LVAD.  Pt's nausea resolved and mobility much improved.  Pt able to place himself on batteries and then back to main terminal with min A and verbal cues.    Follow Up Recommendations  Home health PT;Supervision/Assistance - 24 hour     Does the patient have the potential to tolerate intense rehabilitation     Barriers to Discharge        Equipment Recommendations  Other (comment) (rollator)    Recommendations for Other Services    Frequency Min 5X/week   Plan Discharge plan remains appropriate    Precautions / Restrictions Precautions Precautions: Sternal Precaution Comments: LVAD Required Braces or Orthoses: Other Brace/Splint Other Brace/Splint: LVAD black bag at all times   Pertinent Vitals/Pain VSS    Mobility  Bed Mobility Bed Mobility: Sit to Supine Supine to Sit: 1: +2 Total assist Supine to Sit: Patient Percentage: 60% Sitting - Scoot to Edge of Bed: 3: Mod assist Sit to Supine: 1: +2 Total assist Sit to Supine: Patient Percentage: 60% Details for Bed Mobility Assistance: Assist to bring trunk up in order to maintain sternal precautions. Transfers Sit to Stand: 3: Mod assist;Without upper extremity assist;From bed Stand to Sit: 3: Mod assist;Without upper extremity assist;To bed Details for Transfer Assistance: Verbal cues for sternal precautions.  Assist to bring hips up and to control descent. Ambulation/Gait Ambulation/Gait Assistance: 4: Min assist (+1 for lines/tubes) Ambulation Distance (Feet): 150 Feet Assistive device: Other (Comment) (pushing w/c) Ambulation/Gait Assistance Details: verbal cues to stand more erect Gait Pattern: Step-through pattern;Decreased stride length;Trunk flexed Gait velocity:  decreased    Exercises General Exercises - Lower Extremity Ankle Circles/Pumps: AROM;Both;5 reps;Supine (Instructed pt to perform throughout the day) Quad Sets: Strengthening;Both;5 reps;Supine (Pt instructed to perform throughout the day.)   PT Diagnosis:    PT Problem List:   PT Treatment Interventions:     PT Goals Acute Rehab PT Goals PT Goal: Supine/Side to Sit - Progress: Progressing toward goal PT Goal: Sit to Supine/Side - Progress: Progressing toward goal PT Goal: Sit to Stand - Progress: Progressing toward goal PT Goal: Stand to Sit - Progress: Progressing toward goal PT Goal: Ambulate - Progress: Progressing toward goal  Visit Information  Last PT Received On: 01/16/13 Assistance Needed: +2    Subjective Data  Subjective: Pt stated he was glad to get foley out.   Cognition  Cognition Arousal/Alertness: Awake/alert Behavior During Therapy: WFL for tasks assessed/performed Overall Cognitive Status: Within Functional Limits for tasks assessed    Balance  Static Standing Balance Static Standing - Balance Support: Bilateral upper extremity supported Static Standing - Level of Assistance: 5: Stand by assistance  End of Session PT - End of Session Equipment Utilized During Treatment: Oxygen Activity Tolerance: Patient tolerated treatment well Patient left: in bed;with call bell/phone within reach;with nursing in room Nurse Communication: Mobility status   GP     Timonthy Hovater 01/16/2013, 2:50 PM  [employer] PT [phone]

## 2013-01-16 NOTE — Progress Notes (Signed)
Anesthesiology:  Alert, sitting up in chair, now off milrinone and all other pressors, stable hemodynamics. LVAD now at 9,200 RPM.  Labs OK.   Doing well POD # 4  Ryan Brood, MD

## 2013-01-16 NOTE — Progress Notes (Addendum)
HeartMate 2 Rounding Note  Subjective:    [name] is a 72 year old gentleman with a history of CABG (2003), Chronic systolic HF EF 20%, S/P AICD (generator change 2011), hyperlipidemia, hypothyroidism, emphysema, OSA, intermittent atrial fibrillation (on coumadin).   Admitted 01/10/13 for optimization before LVAD implant. EF by LV gram 20% 01/07/13  Underwent HMII implant 01/12/13  Feels good. Continue to diurese. Weight down 4 pounds but still up 10 pounds. 4 BMs last night. nausea resolved.  Hgb 7.8 (stable) INR 1.23 LDH 298 Co-ox 64%  Dr. Donata Clay turned speed up to 9200 today.   LVAD INTERROGATION:  HeartMate II LVAD:  Flow 5.6 liters/min, speed 9200, power 5.7, PI 5.4.  No PI events  Objective:    Vital Signs:   Temp:  [97.5 F (36.4 C)-98.3 F (36.8 C)] 97.8 F (36.6 C) (04/19 1150) Pulse Rate:  [52-134] 83 (04/19 0900) Resp:  [14-24] 20 (04/19 0900) SpO2:  [94 %-100 %] 98 % (04/19 0914) Weight:  [106.2 kg (234 lb 2.1 oz)] 106.2 kg (234 lb 2.1 oz) (04/19 0500) Last BM Date: 01/15/13 Mean arterial Pressure 60-70s  Intake/Output:   Intake/Output Summary (Last 24 hours) at 01/16/13 1153 Last data filed at 01/16/13 1100  Gross per 24 hour  Intake   1171 ml  Output   2135 ml  Net   -964 ml     Physical Exam: General: NAD, Lying in bed; lethargic HEENT: normal Neck: supple. CVP 12. Carotids 2+ bilat; no bruits. No lymphadenopathy or thryomegaly appreciated. Cor: Mechanical heart sounds with LVAD hum present. Lungs: diminished in the bases Abdomen: soft, nontender, no distention. No hepatosplenomegaly. No bruits or masses. Minimal bowel sounds. Driveline: Dressing C/D/I and securement device intact Extremities: no cyanosis, clubbing, rash, edema Neuro: alert & orientedx3, cranial nerves grossly intact. moves all 4 extremities w/o difficulty  Telemetry: Afib 90-100 bpm  Labs: Basic Metabolic Panel:  Recent Labs Lab 01/12/13 1519  01/12/13 2100   01/13/13 0350 01/13/13 1555 01/13/13 1603 01/14/13 0400 01/14/13 1837 01/15/13 0400 01/16/13 0355  NA 137  < > 138  --  137  --  133* 129* 132* 129* 133*  K 3.4*  < > 3.5  --  4.4  --  3.9 3.7 4.1 4.2 3.6  CL 103  --  104  --  107  --  99 96 93* 95* 98  CO2 27  --   --   --  25  --   --  28  --  31 29  GLUCOSE 139*  < > 152*  --  133*  --  165* 146* 173* 119* 115*  BUN 12  --  11  --  11  --  7 10 12 17  27*  CREATININE 0.71  --  0.80  < > 0.74 0.74 0.70 0.63 0.70 0.67 0.72  CALCIUM 8.3*  --   --   --  8.7  --   --  9.2  --  9.3 9.2  MG 1.6  --   --   < > 2.3 1.9  --  1.9  --  2.0 2.0  PHOS  --   --   --   --  1.6*  --   --  2.1*  --  2.4  --   < > = values in this interval not displayed.  Liver Function Tests:  Recent Labs Lab 01/10/13 1048 01/13/13 0350 01/15/13 0830  AST 20 42* 30  ALT 9 10 11   ALKPHOS  45  --  42  BILITOT 0.6 1.9* 0.7  PROT 6.7  --  5.8*  ALBUMIN 3.3*  --  2.8*    Recent Labs Lab 01/15/13 0830  AMYLASE 16   No results found for this basename: AMMONIA,  in the last 168 hours  CBC:  Recent Labs Lab 01/13/13 0350 01/13/13 1555  01/14/13 0400 01/14/13 1837 01/15/13 0400 01/15/13 1500 01/16/13 0355  WBC 11.7* 13.8*  --  12.4*  --  9.8 8.6 8.8  NEUTROABS 9.1*  --   --  10.2*  --  8.1*  --  6.8  HGB 8.5* 8.4*  < > 7.4* 8.8* 8.2* 7.8* 7.8*  HCT 25.2* 25.6*  < > 22.6* 26.0* 25.0* 23.8* 24.7*  MCV 79.0 79.8  --  79.3  --  81.2 81.5 81.8  PLT 126* 127*  --  100*  --  97* 92* 109*  < > = values in this interval not displayed.  INR:  Recent Labs Lab 01/12/13 1530 01/13/13 0350 01/14/13 0400 01/15/13 0400 01/16/13 0355  INR 1.38 1.42 1.55* 1.33 1.23    Other results:    Imaging: Dg Chest Port 1 View  01/16/2013  *RADIOLOGY REPORT*  Clinical Data: Ventricular assist device placement.  Left-sided chest tube.  PORTABLE CHEST - 1 VIEW  Comparison: 01/15/2013.  Findings: Left ventricular assist device is partially visualized. Left  thoracostomy tube at the base is seen. Mediastinal drain unchanged.  Linear density at the right base suggests pleural plaque.  Interstitial pulmonary edema is present.  Basilar atelectasis.  Left subclavian AICD appears similar.  The right upper extremity PICC appears unchanged.  Enlargement of the cardiopericardial silhouette.  Median sternotomy / CABG. No pneumothorax.  Probable small bilateral pleural effusions.  IMPRESSION:  1.  Stable support apparatus. No pneumothorax. 2.  Enlargement the cardiopericardial silhouette and interstitial pulmonary edema compatible with mild failure. 3.  Basilar atelectasis and probable small bilateral pleural effusions.   Original Report Authenticated By: Andreas Newport, M.D.    Dg Chest Port 1 View  01/15/2013  *RADIOLOGY REPORT*  Clinical Data: Left ventricular assist device placement.  Ischemic cardiomyopathy.  PORTABLE CHEST - 1 VIEW  Comparison: 01/14/2013.  Findings: 0634 hours.  Left IJ central venous catheters, right arm PICC and left subclavian AICD leads appear unchanged; the latter are incompletely visualized.  Likewise, the left ventricular assist device is incompletely visualized.  There is stable cardiomegaly status post CABG.  Bibasilar atelectasis is present with possible mild edema.  The lung bases are incompletely visualized.  There are possible small bilateral pleural effusions.  No pneumothorax is identified.  IMPRESSION:  1.  The visualized support system is unchanged. 2.  Suspected mild increase in pulmonary edema.  Possible small pleural effusions.   Original Report Authenticated By: Carey Bullocks, M.D.    Dg Chest Port 1 View  01/14/2013  *RADIOLOGY REPORT*  Clinical Data: Line placement.  PORTABLE CHEST - 1 VIEW  Comparison: Portable chest 01/14/2013 at 6:26 a.m.  Findings: The patient has a new right PICC with the tip projecting over the lower superior vena cava.  Support apparatus is otherwise unchanged.  There is cardiomegaly and vascular  congestion. Bibasilar atelectasis is noted.  Likely small left effusion is seen.  IMPRESSION:  1.  Tip of right PICC projects over the lower superior vena cava. 2.  Cardiomegaly and vascular congestion. 3.  Bibasilar atelectasis and small left effusion.   Original Report Authenticated By: Holley Dexter, M.D.  Medications:     Scheduled Medications: . acetaminophen  1,000 mg Oral Q6H  . antiseptic oral rinse  15 mL Mouth Rinse BID  . aspirin EC  325 mg Oral Daily  . bisacodyl  10 mg Oral Daily  . budesonide-formoterol  2 puff Inhalation BID  . cefUROXime (ZINACEF)  IV  1.5 g Intravenous Q12H  . citalopram  20 mg Oral Daily  . docusate sodium  200 mg Oral Daily  . feeding supplement  1 Container Oral TID WC  . ferrous fumarate-b12-vitamic C-folic acid  1 capsule Oral TID PC  . furosemide  40 mg Intravenous BID  . insulin aspart  0-24 Units Subcutaneous Q4H  . insulin glargine  12 Units Subcutaneous BID  . levothyroxine  50 mcg Oral QHS  . metoCLOPramide (REGLAN) injection  10 mg Intravenous Q6H  . metoprolol tartrate  12.5 mg Oral BID  . pantoprazole  40 mg Oral Daily  . sodium chloride  10-40 mL Intracatheter Q12H  . tiotropium  18 mcg Inhalation Daily  . Warfarin - Pharmacist Dosing Inpatient   Does not apply q1800    Infusions: . sodium chloride    . sodium chloride    . milrinone 0.125 mcg/kg/min (01/16/13 1100)  . nitroGLYCERIN Stopped (01/12/13 1530)    PRN Medications: acetaminophen, midazolam, morphine injection, oxyCODONE, sodium chloride, sodium chloride, traMADol   Assessment:   1) Acute on chronic HF - class IV  2) Chronic AF on coumadin  3) ICM, EF 20%        S/p LVAD Heartmate II implant 01/12/13 4) CAD  S/p CABG 2003  5) COPD with depressed DLCO  CPAP at night  6) HLD  7) Hypothyroidism  8) Expected blood loss anemia 9) Hyponatremia/hypokalemia  Plan/Discussion:    POD #4 Doing very well. Agree with turning speed up. Can stop milrinone.  Continue diuresis. Continue to ambulate. Increase coumadin. Should be ready for floor once tubes out. Can pull Foley.   I reviewed the LVAD parameters from today, and compared the results to the patient's prior recorded data.  No programming changes were made.  The LVAD is functioning within specified parameters.  Nursing performing LVAD self-test daily.  LVAD interrogation was negative for any significant power changes, alarms or PI events/speed drops.  LVAD equipment check completed and is in good working order.  Back-up equipment present.   LVAD education done on emergency procedures and precautions and reviewed exit site care.  Length of Stay: 6  Quintus Premo 01/16/2013, 11:53 AM

## 2013-01-16 NOTE — Progress Notes (Signed)
Progress Note from the Palliative Medicine Team at [facility]  Subjective: alert and oriented, OOB to chair     Objective: No Known Allergies Scheduled Meds: . acetaminophen  1,000 mg Oral Q6H  . antiseptic oral rinse  15 mL Mouth Rinse BID  . aspirin EC  325 mg Oral Daily  . bisacodyl  10 mg Oral Daily  . budesonide-formoterol  2 puff Inhalation BID  . cefUROXime (ZINACEF)  IV  1.5 g Intravenous Q12H  . citalopram  20 mg Oral Daily  . docusate sodium  200 mg Oral Daily  . feeding supplement  1 Container Oral TID WC  . ferrous fumarate-b12-vitamic C-folic acid  1 capsule Oral TID PC  . furosemide  40 mg Intravenous BID  . insulin aspart  0-24 Units Subcutaneous Q4H  . insulin glargine  12 Units Subcutaneous BID  . levothyroxine  50 mcg Oral QHS  . metoCLOPramide (REGLAN) injection  10 mg Intravenous Q6H  . metoprolol tartrate  12.5 mg Oral BID  . pantoprazole  40 mg Oral Daily  . sodium chloride  10-40 mL Intracatheter Q12H  . tiotropium  18 mcg Inhalation Daily  . Warfarin - Pharmacist Dosing Inpatient   Does not apply q1800   Continuous Infusions: . sodium chloride    . sodium chloride    . milrinone 0.125 mcg/kg/min (01/16/13 1100)  . nitroGLYCERIN Stopped (01/12/13 1530)   PRN Meds:.acetaminophen, midazolam, morphine injection, oxyCODONE, sodium chloride, sodium chloride, traMADol  BP 81/68  Pulse 85  Temp(Src) 97.8 F (36.6 C) (Oral)  Resp 20  Ht 6' (1.829 m)  Wt 106.2 kg (234 lb 2.1 oz)  BMI 31.75 kg/m2  SpO2 98%   PPS:30 % progressing  Pain Score: denies presently    Intake/Output Summary (Last 24 hours) at 01/16/13 1247 Last data filed at 01/16/13 1100  Gross per 24 hour  Intake 1041.1 ml  Output   2090 ml  Net -1048.9 ml       Physical Exam:  General: NAD, OOB to chair HEENT:  Mm, no exudate Chest:   Diminished in bases (encouraged inspirometer, CCD) CVS: noted LVAD hum Abdomen:soft NT +BS Ext: without edema Neuro: alert and oriented  X3 Psych: denies depression,   Labs: CBC    Component Value Date/Time   WBC 8.8 01/16/2013 0355   RBC 3.02* 01/16/2013 0355   HGB 7.8* 01/16/2013 0355   HCT 24.7* 01/16/2013 0355   PLT 109* 01/16/2013 0355   MCV 81.8 01/16/2013 0355   MCH 25.8* 01/16/2013 0355   MCHC 31.6 01/16/2013 0355   RDW 16.2* 01/16/2013 0355   LYMPHSABS 0.9 01/16/2013 0355   MONOABS 0.9 01/16/2013 0355   EOSABS 0.2 01/16/2013 0355   BASOSABS 0.0 01/16/2013 0355    BMET    Component Value Date/Time   NA 133* 01/16/2013 0355   K 3.6 01/16/2013 0355   CL 98 01/16/2013 0355   CO2 29 01/16/2013 0355   GLUCOSE 115* 01/16/2013 0355   BUN 27* 01/16/2013 0355   CREATININE 0.72 01/16/2013 0355   CALCIUM 9.2 01/16/2013 0355   GFRNONAA >90 01/16/2013 0355   GFRAA >90 01/16/2013 0355    CMP     Component Value Date/Time   NA 133* 01/16/2013 0355   K 3.6 01/16/2013 0355   CL 98 01/16/2013 0355   CO2 29 01/16/2013 0355   GLUCOSE 115* 01/16/2013 0355   BUN 27* 01/16/2013 0355   CREATININE 0.72 01/16/2013 0355   CALCIUM 9.2 01/16/2013 0355  PROT 5.8* 01/15/2013 0830   ALBUMIN 2.8* 01/15/2013 0830   AST 30 01/15/2013 0830   ALT 11 01/15/2013 0830   ALKPHOS 42 01/15/2013 0830   BILITOT 0.7 01/15/2013 0830   GFRNONAA >90 01/16/2013 0355   GFRAA >90 01/16/2013 0355    Assessment and Plan:  1. Symptom:        Nausea: suggested sips of ginger ale         Weakness: continued participation with PT, self AROM exercises demonstrated 2. Psycho/Social: Emotional support offered.  Encouraged patient to verbalize positive words and thoughts to self.   3. Spiritual: strong community church support    Time In Time Out Total Time Spent with Patient Total Overall Time  0830 0850 20 min 20 min    Greater than 50%  of this time was spent counseling and coordinating care related to the above assessment and plan.  Lorinda Creed NP  Palliative Medicine Team Team Phone # [phone] Pager [phone]   1

## 2013-01-16 NOTE — Progress Notes (Addendum)
HeartMate 2 Rounding Note  Subjective:    [name] is a 72 year old gentleman with a history of CABG (2003), Chronic systolic HF EF 32%, S/P AICD (generator change 2011), hyperlipidemia, hypothyroidism, emphysema, OSA, intermittent atrial fibrillation (on coumadin). Quit smoking 2003. Uses CPAP and O2 every night. He is a disabled [employer]. He was referred to the AHF clinic by Dr. Elease Hashimoto and over the past couple of months his functional capacity has really declined. He has class IV heart failure symptoms and has not improved on OMM. His CPX in 11/13 showed a VO2 of 9.2%, VE/VCO2- 52.8, OUES 1.15 and RER= 1.02. We have been following him closely and have had multiple discussions about his options and the patient decided that he wanted to proceed with LVAD placement. I discussed the patient with Dr Romona Curls at [facility] who agreed that the patient was an acceptable DT VAD candidate- his pulm hx of asbestosis, low DLCO are a problem for transplant candidacy  Admitted 01/10/13 for optimization before LVAD implant. EF by LV gram 20% 01/07/13  POD#4 Walked 300 ft today- nausea resolved Cont on low dose Mil, dopa weaned off Controlled afib Wt down 3 lbs on IV lasix HB 7.8 will start po Iron   Drainage from tubes 300cc-will leave pocket and MT drains No PI events,  PI is rising so will increase RPM to 9200   CVP -10-12 Co-ox 63 on low dose Mil INR 1.35  LVAD INTERROGATION:  HeartMate II LVAD:  Flow 5.7 liters/min, speed 9000, power 5.5, PI 6.1  No PI events  Objective:    Vital Signs:   Temp:  [97.5 F (36.4 C)-98.3 F (36.8 C)] 97.8 F (36.6 C) (04/19 1150) Pulse Rate:  [52-134] 83 (04/19 0900) Resp:  [14-24] 20 (04/19 0900) SpO2:  [94 %-100 %] 98 % (04/19 0914) Weight:  [234 lb 2.1 oz (106.2 kg)] 234 lb 2.1 oz (106.2 kg) (04/19 0500) Last BM Date: 01/15/13 Mean arterial Pressure 70-80's  Intake/Output:   Intake/Output Summary (Last 24 hours) at 01/16/13 1202 Last data filed at 01/16/13  1100  Gross per 24 hour  Intake 1041.1 ml  Output   2090 ml  Net -1048.9 ml     Physical Exam: General: NAD, Laying in bed HEENT: normal Neck: supple. CVP 8-9 . Carotids 2+ bilat; no bruits. No lymphadenopathy or thryomegaly appreciated. Cor: Mechanical heart sounds with LVAD hum present.- hear L sided Ao vlave closure today Lungs: diminished in the bases Abdomen: soft, nontender, no distention. No hepatosplenomegaly. No bruits or masses. no bowel sounds. Driveline: Dressing C/D/I and securement device intact Extremities: no cyanosis, clubbing, rash, edema Neuro: alert & orientedx3, cranial nerves grossly intact. moves all 4 extremities w/o difficulty  Telemetry: Afib 103 bpm  Labs: Basic Metabolic Panel:  Recent Labs Lab 01/12/13 1519  01/12/13 2100  01/13/13 0350 01/13/13 1555 01/13/13 1603 01/14/13 0400 01/14/13 1837 01/15/13 0400 01/16/13 0355  NA 137  < > 138  --  137  --  133* 129* 132* 129* 133*  K 3.4*  < > 3.5  --  4.4  --  3.9 3.7 4.1 4.2 3.6  CL 103  --  104  --  107  --  99 96 93* 95* 98  CO2 27  --   --   --  25  --   --  28  --  31 29  GLUCOSE 139*  < > 152*  --  133*  --  165* 146* 173* 119* 115*  BUN 12  --  11  --  11  --  7 10 12 17  27*  CREATININE 0.71  --  0.80  < > 0.74 0.74 0.70 0.63 0.70 0.67 0.72  CALCIUM 8.3*  --   --   --  8.7  --   --  9.2  --  9.3 9.2  MG 1.6  --   --   < > 2.3 1.9  --  1.9  --  2.0 2.0  PHOS  --   --   --   --  1.6*  --   --  2.1*  --  2.4  --   < > = values in this interval not displayed.  Liver Function Tests:  Recent Labs Lab 01/10/13 1048 01/13/13 0350 01/15/13 0830  AST 20 42* 30  ALT 9 10 11   ALKPHOS 45  --  42  BILITOT 0.6 1.9* 0.7  PROT 6.7  --  5.8*  ALBUMIN 3.3*  --  2.8*    Recent Labs Lab 01/15/13 0830  AMYLASE 16   No results found for this basename: AMMONIA,  in the last 168 hours  CBC:  Recent Labs Lab 01/13/13 0350 01/13/13 1555  01/14/13 0400 01/14/13 1837 01/15/13 0400  01/15/13 1500 01/16/13 0355  WBC 11.7* 13.8*  --  12.4*  --  9.8 8.6 8.8  NEUTROABS 9.1*  --   --  10.2*  --  8.1*  --  6.8  HGB 8.5* 8.4*  < > 7.4* 8.8* 8.2* 7.8* 7.8*  HCT 25.2* 25.6*  < > 22.6* 26.0* 25.0* 23.8* 24.7*  MCV 79.0 79.8  --  79.3  --  81.2 81.5 81.8  PLT 126* 127*  --  100*  --  97* 92* 109*  < > = values in this interval not displayed.  INR:  Recent Labs Lab 01/12/13 1530 01/13/13 0350 01/14/13 0400 01/15/13 0400 01/16/13 0355  INR 1.38 1.42 1.55* 1.33 1.23    Other results:  EKG:   Imaging: Dg Chest Port 1 View  01/16/2013  *RADIOLOGY REPORT*  Clinical Data: Ventricular assist device placement.  Left-sided chest tube.  PORTABLE CHEST - 1 VIEW  Comparison: 01/15/2013.  Findings: Left ventricular assist device is partially visualized. Left thoracostomy tube at the base is seen. Mediastinal drain unchanged.  Linear density at the right base suggests pleural plaque.  Interstitial pulmonary edema is present.  Basilar atelectasis.  Left subclavian AICD appears similar.  The right upper extremity PICC appears unchanged.  Enlargement of the cardiopericardial silhouette.  Median sternotomy / CABG. No pneumothorax.  Probable small bilateral pleural effusions.  IMPRESSION:  1.  Stable support apparatus. No pneumothorax. 2.  Enlargement the cardiopericardial silhouette and interstitial pulmonary edema compatible with mild failure. 3.  Basilar atelectasis and probable small bilateral pleural effusions.   Original Report Authenticated By: Andreas Newport, M.D.    Dg Chest Port 1 View  01/15/2013  *RADIOLOGY REPORT*  Clinical Data: Left ventricular assist device placement.  Ischemic cardiomyopathy.  PORTABLE CHEST - 1 VIEW  Comparison: 01/14/2013.  Findings: 0634 hours.  Left IJ central venous catheters, right arm PICC and left subclavian AICD leads appear unchanged; the latter are incompletely visualized.  Likewise, the left ventricular assist device is incompletely visualized.   There is stable cardiomegaly status post CABG.  Bibasilar atelectasis is present with possible mild edema.  The lung bases are incompletely visualized.  There are possible small bilateral pleural effusions.  No pneumothorax is identified.  IMPRESSION:  1.  The visualized support system is unchanged. 2.  Suspected mild increase in pulmonary edema.  Possible small pleural effusions.   Original Report Authenticated By: Carey Bullocks, M.D.    Dg Chest Port 1 View  01/14/2013  *RADIOLOGY REPORT*  Clinical Data: Line placement.  PORTABLE CHEST - 1 VIEW  Comparison: Portable chest 01/14/2013 at 6:26 a.m.  Findings: The patient has a new right PICC with the tip projecting over the lower superior vena cava.  Support apparatus is otherwise unchanged.  There is cardiomegaly and vascular congestion. Bibasilar atelectasis is noted.  Likely small left effusion is seen.  IMPRESSION:  1.  Tip of right PICC projects over the lower superior vena cava. 2.  Cardiomegaly and vascular congestion. 3.  Bibasilar atelectasis and small left effusion.   Original Report Authenticated By: Holley Dexter, M.D.      Medications:     Scheduled Medications: . acetaminophen  1,000 mg Oral Q6H  . antiseptic oral rinse  15 mL Mouth Rinse BID  . aspirin EC  325 mg Oral Daily  . bisacodyl  10 mg Oral Daily  . budesonide-formoterol  2 puff Inhalation BID  . cefUROXime (ZINACEF)  IV  1.5 g Intravenous Q12H  . citalopram  20 mg Oral Daily  . docusate sodium  200 mg Oral Daily  . feeding supplement  1 Container Oral TID WC  . ferrous fumarate-b12-vitamic C-folic acid  1 capsule Oral TID PC  . furosemide  40 mg Intravenous BID  . insulin aspart  0-24 Units Subcutaneous Q4H  . insulin glargine  12 Units Subcutaneous BID  . levothyroxine  50 mcg Oral QHS  . metoCLOPramide (REGLAN) injection  10 mg Intravenous Q6H  . metoprolol tartrate  12.5 mg Oral BID  . pantoprazole  40 mg Oral Daily  . sodium chloride  10-40 mL Intracatheter  Q12H  . tiotropium  18 mcg Inhalation Daily  . Warfarin - Pharmacist Dosing Inpatient   Does not apply q1800    Infusions: . sodium chloride    . sodium chloride    . milrinone 0.125 mcg/kg/min (01/16/13 1100)  . nitroGLYCERIN Stopped (01/12/13 1530)    PRN Medications: acetaminophen, midazolam, morphine injection, oxyCODONE, sodium chloride, sodium chloride, traMADol   Assessment:   1) Acute on chronic HF - class IV  2) Chronic AF on coumadin  3) ICM, EF 20%        S/p LVAD Heartmate II implant 01/12/13 4) CAD  S/p CABG 2003  5) COPD with depressed DLCO  CPAP at night  6) HLD  7) Hypothyroidism  8) Expected blood loss anemia- LDH increased prob due to banked blood breakdown but will follow for evidence of hemolysis  Plan/Discussion:    POD #4  Pump parameters and equipment checked and all is in order including backup equipment Length of Stay: 6  Plan__ inc Pump speed, adv diet, cont coumadin load, cont Zinacef until drains out. Hold transfusion but start po Iron  I reviewed the LVAD parameters from today, and compared the results to the patient's prior recorded data. No programming changes were made. The LVAD is functioning within specified parameters. Nursing performing LVAD self-test daily. LVAD interrogation was negative for any significant power changes, alarms, or PI events or speed drops. LVAD equipment check completed and is in good working order. Backup equipment present. LVAD education done on emergency procedures and precautions and reviewed drive line exit site care.  VAN TRIGT III,[name] 01/16/2013, 12:02 PM

## 2013-01-16 NOTE — Progress Notes (Signed)
ANTICOAGULATION CONSULT NOTE   Pharmacy Consult for warfarin  Indication: atrial fibrillation and LVAD placed 4/15  No Known Allergies  Patient Measurements: Height: 6' (182.9 cm) Weight: 234 lb 2.1 oz (106.2 kg) IBW/kg (Calculated) : 77.6  Vital Signs: Temp: 97.8 F (36.6 C) (04/19 1150) Temp src: Oral (04/19 1150) Pulse Rate: 85 (04/19 1200)  Labs:  Recent Labs  01/14/13 0400 01/14/13 1837 01/15/13 0400 01/15/13 1500 01/16/13 0355  HGB 7.4* 8.8* 8.2* 7.8* 7.8*  HCT 22.6* 26.0* 25.0* 23.8* 24.7*  PLT 100*  --  97* 92* 109*  LABPROT 18.1*  --  16.2*  --  15.3*  INR 1.55*  --  1.33  --  1.23  CREATININE 0.63 0.70 0.67  --  0.72    Estimated Creatinine Clearance: 106.6 ml/min (by C-G formula based on Cr of 0.72).  Medications:  PTA Warfarin 5mg  daily except 2.5mg  on Thursday  Assessment: 72 year old male now POD#4 from LVAD. Patient was on coumadin prior to admission for afib, was on heparin bridge prior to surgery.  INR currently down to 1.23 possibly due to receiving blood, now off amiodarone.   No overt bleeding noted post-op. Hgb still slight drop post op 7.8 - watch closely.   Will provide warfarin re-education once transferred to floor.  Orders received this am to continue zinacef until pocket drain is removed.  Goal of Therapy:  INR 2-3 Monitor platelets by anticoagulation protocol: Yes   Plan:  Warfarin 7.5mg  tonight Daily INR for now Follow results of UCX  Leota Sauers Pharm.D. CPP, BCPS Clinical Pharmacist [phone] 01/16/2013 1:47 PM

## 2013-01-16 NOTE — Progress Notes (Signed)
[facility] BRIEF SICU PROGRESS NOTE  4 Days Post-Op  S/P Procedure(s) (LRB): INSERTION OF IMPLANTABLE LEFT VENTRICULAR ASSIST DEVICE (N/A) INTRAOPERATIVE TRANSESOPHAGEAL ECHOCARDIOGRAM (N/A)   Stable day Ambulating around SICU VAD flows and MAP stable  Plan: Continue current plan  Alani Sabbagh H 01/16/2013 6:25 PM

## 2013-01-17 ENCOUNTER — Inpatient Hospital Stay (HOSPITAL_COMMUNITY): Payer: Medicare Other

## 2013-01-17 LAB — MAGNESIUM: Magnesium: 2.1 mg/dL (ref 1.5–2.5)

## 2013-01-17 LAB — COMPREHENSIVE METABOLIC PANEL
ALT: 11 U/L (ref 0–53)
AST: 23 U/L (ref 0–37)
Albumin: 2.7 g/dL — ABNORMAL LOW (ref 3.5–5.2)
Alkaline Phosphatase: 43 U/L (ref 39–117)
BUN: 24 mg/dL — ABNORMAL HIGH (ref 6–23)
CO2: 30 mEq/L (ref 19–32)
Calcium: 9.2 mg/dL (ref 8.4–10.5)
Chloride: 97 mEq/L (ref 96–112)
Creatinine, Ser: 0.68 mg/dL (ref 0.50–1.35)
GFR calc Af Amer: >90 mL/min (ref >90)
GFR calc non Af Amer: >90 mL/min (ref >90)
Glucose, Bld: 82 mg/dL (ref 70–99)
Potassium: 3.8 mEq/L (ref 3.5–5.1)
Sodium: 130 mEq/L — ABNORMAL LOW (ref 135–145)
Total Bilirubin: 0.5 mg/dL (ref 0.3–1.2)
Total Protein: 5.9 g/dL — ABNORMAL LOW (ref 6.0–8.3)

## 2013-01-17 LAB — CARBOXYHEMOGLOBIN
Carboxyhemoglobin: 1.7 % — ABNORMAL HIGH (ref 0.5–1.5)
Methemoglobin: 0.9 % (ref 0.0–1.5)
Total hemoglobin: 9.2 g/dL — ABNORMAL LOW (ref 13.5–18.0)

## 2013-01-17 LAB — GLUCOSE, CAPILLARY
Glucose-Capillary: 120 mg/dL — ABNORMAL HIGH (ref 70–99)
Glucose-Capillary: 127 mg/dL — ABNORMAL HIGH (ref 70–99)
Glucose-Capillary: 79 mg/dL (ref 70–99)
Glucose-Capillary: 81 mg/dL (ref 70–99)
Glucose-Capillary: 91 mg/dL (ref 70–99)
Glucose-Capillary: 98 mg/dL (ref 70–99)

## 2013-01-17 LAB — CBC WITH DIFFERENTIAL/PLATELET
Eosinophils Absolute: 0.2 10*3/uL (ref 0.0–0.7)
Eosinophils Relative: 3 % (ref 0–5)
HCT: 25.4 % — ABNORMAL LOW (ref 39.0–52.0)
Lymphocytes Relative: 13 % (ref 12–46)
Lymphs Abs: 1.1 10*3/uL (ref 0.7–4.0)
MCH: 25.9 pg — ABNORMAL LOW (ref 26.0–34.0)
MCV: 82.2 fL (ref 78.0–100.0)
Monocytes Absolute: 1.1 10*3/uL — ABNORMAL HIGH (ref 0.1–1.0)
Platelets: 111 10*3/uL — ABNORMAL LOW (ref 150–400)
RDW: 16.4 % — ABNORMAL HIGH (ref 11.5–15.5)
WBC: 8.4 10*3/uL (ref 4.0–10.5)

## 2013-01-17 LAB — PROTIME-INR: INR: 1.43 (ref 0.00–1.49)

## 2013-01-17 MED ORDER — INSULIN GLARGINE 100 UNIT/ML ~~LOC~~ SOLN
12.0000 [IU] | Freq: Every day | SUBCUTANEOUS | Status: DC
  Administered 2013-01-17 – 2013-01-21 (×5): 12 [IU] via SUBCUTANEOUS
  Filled 2013-01-17 (×6): qty 0.12

## 2013-01-17 MED ORDER — POTASSIUM CHLORIDE 10 MEQ/50ML IV SOLN
INTRAVENOUS | Status: AC
  Administered 2013-01-17: 10 meq
  Filled 2013-01-17: qty 150

## 2013-01-17 MED ORDER — WARFARIN SODIUM 7.5 MG PO TABS
7.5000 mg | ORAL_TABLET | Freq: Once | ORAL | Status: AC
  Administered 2013-01-17: 7.5 mg via ORAL
  Filled 2013-01-17: qty 1

## 2013-01-17 MED ORDER — HEPARIN (PORCINE) IN NACL 100-0.45 UNIT/ML-% IJ SOLN
500.0000 [IU]/h | INTRAMUSCULAR | Status: DC
  Administered 2013-01-17: 500 [IU]/h via INTRAVENOUS
  Filled 2013-01-17: qty 250

## 2013-01-17 MED ORDER — POTASSIUM CHLORIDE 10 MEQ/50ML IV SOLN
10.0000 meq | INTRAVENOUS | Status: AC
  Administered 2013-01-17 (×3): 10 meq via INTRAVENOUS

## 2013-01-17 NOTE — Progress Notes (Addendum)
HeartMate 2 Rounding Note postop day 5 status post redo sternotomy and HeartMate 2 implantation  Subjective:    [name] is a 72 year old gentleman with a history of CABG (2003), Chronic systolic HF EF 20-25% %, S/P AICD (generator change 2011), hyperlipidemia, hypothyroidism, emphysema, OSA, intermittent atrial fibrillation (on coumadin). Quit smoking 2003. Uses CPAP and O2 every night. He is a disabled [employer]. He was referred to the AHF clinic by Dr. Elease Hashimoto and over the past couple of months his functional capacity has really declined. He has class IV heart failure symptoms and has not improved on OMM. His CPX in 11/13 showed a VO2 of 9.2%, VE/VCO2- 52.8, OUES 1.15 and RER= 1.02. We have been following him closely and have had multiple discussions about his options and the patient decided that he wanted to proceed with LVAD placement. I discussed the patient with Dr Romona Curls at [facility] who agreed that the patient was an acceptable DT VAD candidate- his pulm hx of asbestosis, low DLCO are a problem for transplant candidacy  Admitted 01/10/13 for optimization before LVAD implant with acute on chronic heart failure.. EF by LV gram 20% 01/07/13  POD#5 Walked 300 ft today- nausea resolved Inotropes all weaned off-co-oxthis a.m. 58.5  Controlled afib Wt down 1 lbs on IV lasix Hemoglobin up to 8.2 on oral iron and diuresis  MT and pocket drainage decreased-will remove MT drain today No PI events, , continue LVAD speed  at 9200 RPM   CVP -10-12 Co-ox 58 off milrinone INR 1. 4  LVAD INTERROGATION:  HeartMate II LVAD:  Flow 5.7 liters/min, speed 9000, power 5.5, PI 6.1  No PI events  Objective:    Vital Signs:   Temp:  [97.4 F (36.3 C)-98.2 F (36.8 C)] 97.7 F (36.5 C) (04/20 0743) Pulse Rate:  [62-124] 67 (04/20 0800) Resp:  [15-23] 23 (04/20 0800) SpO2:  [90 %-100 %] 100 % (04/20 0831) Weight:  [233 lb 14.5 oz (106.1 kg)] 233 lb 14.5 oz (106.1 kg) (04/20 0500) Last BM Date:  01/16/13 Mean arterial Pressure 70-80's  Intake/Output:   Intake/Output Summary (Last 24 hours) at 01/17/13 0943 Last data filed at 01/17/13 0800  Gross per 24 hour  Intake  582.3 ml  Output   1430 ml  Net -847.7 ml     Physical Exam: General: NAD, Laying in bed HEENT: normal Neck: supple. CVP 8-9 . Carotids -  no bruits. No lymphadenopathy or thryomegaly appreciated. Cor: Mechanical heart sounds with LVAD hum present.-  Lungs: diminished in the bases Abdomen: soft, nontender, no distention. No hepatosplenomegaly. No bruits or masses. no bowel sounds. Driveline: Dressing C/D/I and securement device intact Extremities: no cyanosis, clubbing, rash, edema extremities warm and well perfused Neuro: alert & orientedx3, cranial nerves grossly intact. moves all 4 extremities w/o difficulty  Telemetry: Afib 103 bpm  Labs: Basic Metabolic Panel:  Recent Labs Lab 01/12/13 2100  01/13/13 0350 01/13/13 1555  01/14/13 0400 01/14/13 1837 01/15/13 0400 01/16/13 0355 01/17/13 0341  NA 138  --  137  --   < > 129* 132* 129* 133* 130*  K 3.5  --  4.4  --   < > 3.7 4.1 4.2 3.6 3.8  CL 104  --  107  --   < > 96 93* 95* 98 97  CO2  --   --  25  --   --  28  --  31 29 30   GLUCOSE 152*  --  133*  --   < >  146* 173* 119* 115* 82  BUN 11  --  11  --   < > 10 12 17  27* 24*  CREATININE 0.80  < > 0.74 0.74  < > 0.63 0.70 0.67 0.72 0.68  CALCIUM  --   --  8.7  --   --  9.2  --  9.3 9.2 9.2  MG  --   < > 2.3 1.9  --  1.9  --  2.0 2.0 2.1  PHOS  --   --  1.6*  --   --  2.1*  --  2.4  --   --   < > = values in this interval not displayed.  Liver Function Tests:  Recent Labs Lab 01/10/13 1048 01/13/13 0350 01/15/13 0830 01/17/13 0341  AST 20 42* 30 23  ALT 9 10 11 11   ALKPHOS 45  --  42 43  BILITOT 0.6 1.9* 0.7 0.5  PROT 6.7  --  5.8* 5.9*  ALBUMIN 3.3*  --  2.8* 2.7*    Recent Labs Lab 01/15/13 0830  AMYLASE 16   No results found for this basename: AMMONIA,  in the last 168  hours  CBC:  Recent Labs Lab 01/13/13 0350  01/14/13 0400 01/14/13 1837 01/15/13 0400 01/15/13 1500 01/16/13 0355 01/17/13 0341  WBC 11.7*  < > 12.4*  --  9.8 8.6 8.8 8.4  NEUTROABS 9.1*  --  10.2*  --  8.1*  --  6.8 6.0  HGB 8.5*  < > 7.4* 8.8* 8.2* 7.8* 7.8* 8.0*  HCT 25.2*  < > 22.6* 26.0* 25.0* 23.8* 24.7* 25.4*  MCV 79.0  < > 79.3  --  81.2 81.5 81.8 82.2  PLT 126*  < > 100*  --  97* 92* 109* 111*  < > = values in this interval not displayed.  INR:  Recent Labs Lab 01/13/13 0350 01/14/13 0400 01/15/13 0400 01/16/13 0355 01/17/13 0341  INR 1.42 1.55* 1.33 1.23 1.43    Other results:  EKG:   Imaging: Dg Chest Port 1 View  01/17/2013  *RADIOLOGY REPORT*  Clinical Data: Ventricular assist device.  PORTABLE CHEST - 1 VIEW  Comparison: 01/16/2013  Findings: Support devices including ventricular assist device are in stable position.  Cardiomegaly with vascular congestion, slightly improved.  Bibasilar atelectasis, also slightly improved. Small effusions are stable.  IMPRESSION: Slight improvement in vascular congestion and bibasilar atelectasis.  Otherwise no change.   Original Report Authenticated By: Charlett Nose, M.D.    Dg Chest Port 1 View  01/16/2013  *RADIOLOGY REPORT*  Clinical Data: Ventricular assist device placement.  Left-sided chest tube.  PORTABLE CHEST - 1 VIEW  Comparison: 01/15/2013.  Findings: Left ventricular assist device is partially visualized. Left thoracostomy tube at the base is seen. Mediastinal drain unchanged.  Linear density at the right base suggests pleural plaque.  Interstitial pulmonary edema is present.  Basilar atelectasis.  Left subclavian AICD appears similar.  The right upper extremity PICC appears unchanged.  Enlargement of the cardiopericardial silhouette.  Median sternotomy / CABG. No pneumothorax.  Probable small bilateral pleural effusions.  IMPRESSION:  1.  Stable support apparatus. No pneumothorax. 2.  Enlargement the  cardiopericardial silhouette and interstitial pulmonary edema compatible with mild failure. 3.  Basilar atelectasis and probable small bilateral pleural effusions.   Original Report Authenticated By: Andreas Newport, M.D.      Medications:     Scheduled Medications: . acetaminophen  1,000 mg Oral Q6H  . antiseptic oral rinse  15  mL Mouth Rinse BID  . aspirin EC  325 mg Oral Daily  . bisacodyl  10 mg Oral Daily  . budesonide-formoterol  2 puff Inhalation BID  . cefUROXime (ZINACEF)  IV  1.5 g Intravenous Q12H  . citalopram  20 mg Oral Daily  . docusate sodium  200 mg Oral Daily  . feeding supplement  1 Container Oral TID WC  . ferrous fumarate-b12-vitamic C-folic acid  1 capsule Oral TID PC  . furosemide  40 mg Intravenous BID  . insulin aspart  0-24 Units Subcutaneous Q4H  . insulin glargine  12 Units Subcutaneous QHS  . levothyroxine  50 mcg Oral QHS  . pantoprazole  40 mg Oral Daily  . potassium chloride  10 mEq Intravenous Q1 Hr x 3  . tiotropium  18 mcg Inhalation Daily  . Warfarin - Pharmacist Dosing Inpatient   Does not apply q1800    Infusions: . sodium chloride 20 mL/hr at 01/17/13 0311  . sodium chloride    . milrinone 0.125 mcg/kg/min (01/16/13 1100)  . nitroGLYCERIN Stopped (01/12/13 1530)    PRN Medications: acetaminophen, midazolam, morphine injection, oxyCODONE, sodium chloride, sodium chloride, traMADol   Assessment:   1) Acute on chronic HF - class IV  2) Chronic AF on coumadin  3) ICM, EF 20%        S/p LVAD Heartmate II implant 01/12/13 4) CAD  S/p CABG 2003  5) COPD with depressed DLCO  CPAP at night  6) HLD  7) Hypothyroidism  8) Expected blood loss anemia- LDH transiently increased prob due to banked blood breakdown but will follow for evidence of hemolysis. Continue oral iron and folate  Plan/Discussion:    POD #5  Plan We'll start low-dose heparin infusion without load as INR is slow to respond to Coumadin loading  Remove MT, increased  Coumadin per pharmacy, reduce Lantus insulin, continue ambulation, plan transfer to step down tomorrow. Hold metoprolol today as heart rate is 90 atrial fib and co ox is lower off milrinone   Length of Stay: 7   I reviewed the LVAD parameters from today, and compared the results to the patient's prior recorded data. No programming changes were made. The LVAD is functioning within specified parameters. Nursing performing LVAD self-test daily. LVAD interrogation was negative for any significant power changes, alarms, or PI events or speed drops. LVAD equipment check completed and is in good working order. Backup equipment present. LVAD education done on emergency procedures and precautions and reviewed drive line exit site care.  VAN TRIGT III,Jayln Branscom 01/17/2013, 9:43 AM

## 2013-01-17 NOTE — Progress Notes (Signed)
[facility] BRIEF SICU PROGRESS NOTE  5 Days Post-Op  S/P Procedure(s) (LRB): INSERTION OF IMPLANTABLE LEFT VENTRICULAR ASSIST DEVICE (N/A) INTRAOPERATIVE TRANSESOPHAGEAL ECHOCARDIOGRAM (N/A)   Stable day Ambulating around SICU Stable VAD flows and MAP w/ no PI events  Plan: Continue current plan  Purcell Nails 01/17/2013 6:39 PM

## 2013-01-17 NOTE — Progress Notes (Signed)
Spoke to pt about wearing cpap at night. He says he doesn't want to wear while in the ICU.  Jacqulynn Cadet RRT

## 2013-01-17 NOTE — Progress Notes (Addendum)
Patient ID: SYAIR FRICKER, male   DOB: September 12, 1941, 72 y.o.   MRN: [id number] HeartMate 2 Rounding Note  Subjective:    Dang is a 72 year old gentleman with a history of CABG (2003), Chronic systolic HF EF 20%, S/P AICD (generator change 2011), hyperlipidemia, hypothyroidism, emphysema, OSA, intermittent atrial fibrillation (on coumadin).   Admitted 01/10/13 for optimization before LVAD implant. EF by LV gram 20% 01/07/13  Underwent HMII implant 01/12/13  Feels good. Continue to diurese. Weight continues to fall, CVP 11 today.  Reasonable diuresis yesterday.  Co-ox 59% (lower off milrinone).   LVAD INTERROGATION:  HeartMate II LVAD:  Flow 5.8 liters/min, speed 9200, power 5.8, PI 5.1.  No PI events today so far.  Objective:    Vital Signs:   Temp:  [97.4 F (36.3 C)-98.2 F (36.8 C)] 97.7 F (36.5 C) (04/20 0743) Pulse Rate:  [62-124] 67 (04/20 0800) Resp:  [15-23] 23 (04/20 0800) SpO2:  [90 %-100 %] 100 % (04/20 0831) Weight:  [233 lb 14.5 oz (106.1 kg)] 233 lb 14.5 oz (106.1 kg) (04/20 0500) Last BM Date: 01/16/13 Mean arterial Pressure 60-70s  Intake/Output:   Intake/Output Summary (Last 24 hours) at 01/17/13 1029 Last data filed at 01/17/13 0800  Gross per 24 hour  Intake  458.2 ml  Output   1355 ml  Net -896.8 ml     Physical Exam: General: NAD, sitting in chair HEENT: normal Neck: supple. CVP 10-12. Carotids 2+ bilat; no bruits. No lymphadenopathy or thryomegaly appreciated. Cor: Mechanical heart sounds with LVAD hum present. Lungs: diminished in the bases Abdomen: soft, nontender, no distention. No hepatosplenomegaly. No bruits or masses. Minimal bowel sounds. Driveline: Dressing C/D/I and securement device intact Extremities: no cyanosis, clubbing, rash, edema Neuro: alert & orientedx3, cranial nerves grossly intact. moves all 4 extremities w/o difficulty  Telemetry: Afib 90-100 bpm  Labs: Basic Metabolic Panel:  Recent Labs Lab 01/12/13 2100   01/13/13 0350 01/13/13 1555  01/14/13 0400 01/14/13 1837 01/15/13 0400 01/16/13 0355 01/17/13 0341  NA 138  --  137  --   < > 129* 132* 129* 133* 130*  K 3.5  --  4.4  --   < > 3.7 4.1 4.2 3.6 3.8  CL 104  --  107  --   < > 96 93* 95* 98 97  CO2  --   --  25  --   --  28  --  31 29 30   GLUCOSE 152*  --  133*  --   < > 146* 173* 119* 115* 82  BUN 11  --  11  --   < > 10 12 17  27* 24*  CREATININE 0.80  < > 0.74 0.74  < > 0.63 0.70 0.67 0.72 0.68  CALCIUM  --   --  8.7  --   --  9.2  --  9.3 9.2 9.2  MG  --   < > 2.3 1.9  --  1.9  --  2.0 2.0 2.1  PHOS  --   --  1.6*  --   --  2.1*  --  2.4  --   --   < > = values in this interval not displayed.  Liver Function Tests:  Recent Labs Lab 01/10/13 1048 01/13/13 0350 01/15/13 0830 01/17/13 0341  AST 20 42* 30 23  ALT 9 10 11 11   ALKPHOS 45  --  42 43  BILITOT 0.6 1.9* 0.7 0.5  PROT 6.7  --  5.8*  5.9*  ALBUMIN 3.3*  --  2.8* 2.7*    Recent Labs Lab 01/15/13 0830  AMYLASE 16   No results found for this basename: AMMONIA,  in the last 168 hours  CBC:  Recent Labs Lab 01/13/13 0350  01/14/13 0400 01/14/13 1837 01/15/13 0400 01/15/13 1500 01/16/13 0355 01/17/13 0341  WBC 11.7*  < > 12.4*  --  9.8 8.6 8.8 8.4  NEUTROABS 9.1*  --  10.2*  --  8.1*  --  6.8 6.0  HGB 8.5*  < > 7.4* 8.8* 8.2* 7.8* 7.8* 8.0*  HCT 25.2*  < > 22.6* 26.0* 25.0* 23.8* 24.7* 25.4*  MCV 79.0  < > 79.3  --  81.2 81.5 81.8 82.2  PLT 126*  < > 100*  --  97* 92* 109* 111*  < > = values in this interval not displayed.  INR:  Recent Labs Lab 01/13/13 0350 01/14/13 0400 01/15/13 0400 01/16/13 0355 01/17/13 0341  INR 1.42 1.55* 1.33 1.23 1.43    Other results:    Imaging: Dg Chest Port 1 View  01/17/2013  *RADIOLOGY REPORT*  Clinical Data: Ventricular assist device.  PORTABLE CHEST - 1 VIEW  Comparison: 01/16/2013  Findings: Support devices including ventricular assist device are in stable position.  Cardiomegaly with vascular  congestion, slightly improved.  Bibasilar atelectasis, also slightly improved. Small effusions are stable.  IMPRESSION: Slight improvement in vascular congestion and bibasilar atelectasis.  Otherwise no change.   Original Report Authenticated By: Charlett Nose, M.D.    Dg Chest Port 1 View  01/16/2013  *RADIOLOGY REPORT*  Clinical Data: Ventricular assist device placement.  Left-sided chest tube.  PORTABLE CHEST - 1 VIEW  Comparison: 01/15/2013.  Findings: Left ventricular assist device is partially visualized. Left thoracostomy tube at the base is seen. Mediastinal drain unchanged.  Linear density at the right base suggests pleural plaque.  Interstitial pulmonary edema is present.  Basilar atelectasis.  Left subclavian AICD appears similar.  The right upper extremity PICC appears unchanged.  Enlargement of the cardiopericardial silhouette.  Median sternotomy / CABG. No pneumothorax.  Probable small bilateral pleural effusions.  IMPRESSION:  1.  Stable support apparatus. No pneumothorax. 2.  Enlargement the cardiopericardial silhouette and interstitial pulmonary edema compatible with mild failure. 3.  Basilar atelectasis and probable small bilateral pleural effusions.   Original Report Authenticated By: Andreas Newport, M.D.      Medications:     Scheduled Medications: . acetaminophen  1,000 mg Oral Q6H  . antiseptic oral rinse  15 mL Mouth Rinse BID  . aspirin EC  325 mg Oral Daily  . bisacodyl  10 mg Oral Daily  . budesonide-formoterol  2 puff Inhalation BID  . cefUROXime (ZINACEF)  IV  1.5 g Intravenous Q12H  . citalopram  20 mg Oral Daily  . docusate sodium  200 mg Oral Daily  . feeding supplement  1 Container Oral TID WC  . ferrous fumarate-b12-vitamic C-folic acid  1 capsule Oral TID PC  . furosemide  40 mg Intravenous BID  . insulin aspart  0-24 Units Subcutaneous Q4H  . insulin glargine  12 Units Subcutaneous QHS  . levothyroxine  50 mcg Oral QHS  . pantoprazole  40 mg Oral Daily  .  potassium chloride  10 mEq Intravenous Q1 Hr x 3  . tiotropium  18 mcg Inhalation Daily  . Warfarin - Pharmacist Dosing Inpatient   Does not apply q1800    Infusions: . sodium chloride 20 mL/hr at 01/17/13 0311  .  sodium chloride    . heparin    . milrinone 0.125 mcg/kg/min (01/16/13 1100)  . nitroGLYCERIN Stopped (01/12/13 1530)    PRN Medications: acetaminophen, midazolam, morphine injection, oxyCODONE, sodium chloride, sodium chloride, traMADol   Assessment:   1) Acute on chronic HF - class IV  2) Chronic AF on coumadin  3) ICM, EF 20%        S/p LVAD Heartmate II implant 01/12/13 4) CAD  S/p CABG 2003  5) COPD with depressed DLCO  CPAP at night  6) HLD  7) Hypothyroidism  8) Expected blood loss anemia 9) Hyponatremia/hypokalemia  Plan/Discussion:    POD #5 Doing well. Off milrinone, co-ox fell a bit.  Agree with stopping metoprolol given stable HR.  No PI events.  Continue IV Lasix (gently diuresing and weight coming down).   INR slow to rise, low dose heparin gtt added today.   I reviewed the LVAD parameters from today, and compared the results to the patient's prior recorded data.  No programming changes were made.  The LVAD is functioning within specified parameters.  Nursing performing LVAD self-test daily.  LVAD interrogation was negative for any significant power changes, alarms or PI events/speed drops.  LVAD equipment check completed and is in good working order.  Back-up equipment present.   LVAD education done on emergency procedures and precautions and reviewed exit site care.  Length of Stay: 7  Adelayde Minney Shirlee Latch 01/17/2013, 10:29 AM

## 2013-01-17 NOTE — Progress Notes (Addendum)
ANTICOAGULATION CONSULT NOTE   Pharmacy Consult for warfarin  Indication: atrial fibrillation and LVAD placed 4/15  No Known Allergies  Patient Measurements: Height: 6' (182.9 cm) Weight: 233 lb 14.5 oz (106.1 kg) IBW/kg (Calculated) : 77.6  Vital Signs: Temp: 97.7 F (36.5 C) (04/20 0743) Temp src: Oral (04/20 0300) Pulse Rate: 67 (04/20 0800)  Labs:  Recent Labs  01/15/13 0400 01/15/13 1500 01/16/13 0355 01/17/13 0341  HGB 8.2* 7.8* 7.8* 8.0*  HCT 25.0* 23.8* 24.7* 25.4*  PLT 97* 92* 109* 111*  LABPROT 16.2*  --  15.3* 17.1*  INR 1.33  --  1.23 1.43  CREATININE 0.67  --  0.72 0.68    Estimated Creatinine Clearance: 106.6 ml/min (by C-G formula based on Cr of 0.68).  Medications:  PTA Warfarin 5mg  daily except 2.5mg  on Thursday  Assessment: 72 year old male now POD#5 from LVAD. Patient was on coumadin prior to admission for afib, was on heparin bridge prior to surgery.  INR slow to respond to warfarin post op down to 1.23> 1.43 after Coumadin 7.5 last pm - will repeat.   No overt bleeding noted post-op. Hgb still low stable - watch closely.   Will provide warfarin re-education once transferred to floor.  Orders received this am to continue zinacef until pocket drain is removed - possible today will watch.    Goal of Therapy:  INR 2-3 Monitor platelets by anticoagulation protocol: Yes   Plan:  Warfarin 7.5mg  again tonight Daily INR for now Follow results of UCX - no growth Begin low dose heparin 500 uts/hr per MD thisi AM  Leota Sauers Pharm.D. CPP, BCPS Clinical Pharmacist [phone] 01/17/2013 11:47 AM

## 2013-01-18 ENCOUNTER — Encounter (HOSPITAL_COMMUNITY): Payer: Medicare Other

## 2013-01-18 ENCOUNTER — Inpatient Hospital Stay (HOSPITAL_COMMUNITY): Payer: Medicare Other

## 2013-01-18 DIAGNOSIS — Z95818 Presence of other cardiac implants and grafts: Secondary | ICD-10-CM

## 2013-01-18 DIAGNOSIS — I5023 Acute on chronic systolic (congestive) heart failure: Secondary | ICD-10-CM

## 2013-01-18 LAB — CBC WITH DIFFERENTIAL/PLATELET
Basophils Absolute: 0 10*3/uL (ref 0.0–0.1)
Eosinophils Relative: 3 % (ref 0–5)
Lymphocytes Relative: 14 % (ref 12–46)
MCV: 82 fL (ref 78.0–100.0)
Neutrophils Relative %: 70 % (ref 43–77)
Platelets: 148 10*3/uL — ABNORMAL LOW (ref 150–400)
RBC: 3.16 MIL/uL — ABNORMAL LOW (ref 4.22–5.81)
RDW: 16.9 % — ABNORMAL HIGH (ref 11.5–15.5)
WBC: 7.7 10*3/uL (ref 4.0–10.5)

## 2013-01-18 LAB — GLUCOSE, CAPILLARY
Glucose-Capillary: 103 mg/dL — ABNORMAL HIGH (ref 70–99)
Glucose-Capillary: 108 mg/dL — ABNORMAL HIGH (ref 70–99)
Glucose-Capillary: 110 mg/dL — ABNORMAL HIGH (ref 70–99)
Glucose-Capillary: 111 mg/dL — ABNORMAL HIGH (ref 70–99)
Glucose-Capillary: 115 mg/dL — ABNORMAL HIGH (ref 70–99)
Glucose-Capillary: 117 mg/dL — ABNORMAL HIGH (ref 70–99)

## 2013-01-18 LAB — CARBOXYHEMOGLOBIN
Carboxyhemoglobin: 1.8 % — ABNORMAL HIGH (ref 0.5–1.5)
O2 Saturation: 60 %

## 2013-01-18 LAB — BASIC METABOLIC PANEL
CO2: 29 mEq/L (ref 19–32)
Calcium: 9.7 mg/dL (ref 8.4–10.5)
Creatinine, Ser: 0.69 mg/dL (ref 0.50–1.35)
GFR calc Af Amer: >90 mL/min (ref >90)
GFR calc non Af Amer: >90 mL/min (ref >90)
Sodium: 131 mEq/L — ABNORMAL LOW (ref 135–145)

## 2013-01-18 LAB — PROTIME-INR: INR: 1.83 — ABNORMAL HIGH (ref 0.00–1.49)

## 2013-01-18 LAB — MAGNESIUM: Magnesium: 2.1 mg/dL (ref 1.5–2.5)

## 2013-01-18 LAB — APTT
aPTT: >200 seconds (ref 24–37)
aPTT: 64 seconds — ABNORMAL HIGH (ref 24–37)

## 2013-01-18 MED ORDER — POTASSIUM CHLORIDE 10 MEQ/50ML IV SOLN
10.0000 meq | INTRAVENOUS | Status: AC
  Administered 2013-01-18 (×2): 10 meq via INTRAVENOUS
  Filled 2013-01-18 (×2): qty 50

## 2013-01-18 MED ORDER — HYDROCORTISONE 2.5 % RE CREA
TOPICAL_CREAM | Freq: Three times a day (TID) | RECTAL | Status: DC
  Administered 2013-01-18: 18:00:00 via RECTAL
  Administered 2013-01-18: 1 via RECTAL
  Administered 2013-01-19 – 2013-01-21 (×4): via RECTAL
  Filled 2013-01-18: qty 28.35

## 2013-01-18 MED ORDER — FUROSEMIDE 40 MG PO TABS
40.0000 mg | ORAL_TABLET | Freq: Two times a day (BID) | ORAL | Status: DC
  Filled 2013-01-18 (×2): qty 1

## 2013-01-18 MED ORDER — POTASSIUM CHLORIDE CRYS ER 20 MEQ PO TBCR
20.0000 meq | EXTENDED_RELEASE_TABLET | Freq: Two times a day (BID) | ORAL | Status: DC
  Administered 2013-01-19 – 2013-01-22 (×7): 20 meq via ORAL
  Filled 2013-01-18 (×8): qty 1

## 2013-01-18 MED ORDER — METOPROLOL TARTRATE 12.5 MG HALF TABLET
12.5000 mg | ORAL_TABLET | Freq: Two times a day (BID) | ORAL | Status: DC
  Administered 2013-01-18 – 2013-01-19 (×4): 12.5 mg via ORAL
  Filled 2013-01-18 (×6): qty 1

## 2013-01-18 MED ORDER — METOCLOPRAMIDE HCL 10 MG PO TABS
10.0000 mg | ORAL_TABLET | Freq: Three times a day (TID) | ORAL | Status: DC
  Administered 2013-01-18 – 2013-01-20 (×8): 10 mg via ORAL
  Filled 2013-01-18 (×9): qty 1

## 2013-01-18 MED ORDER — ENSURE COMPLETE PO LIQD
237.0000 mL | Freq: Two times a day (BID) | ORAL | Status: DC
  Administered 2013-01-19 – 2013-01-21 (×3): 237 mL via ORAL

## 2013-01-18 MED ORDER — WARFARIN SODIUM 5 MG PO TABS
5.0000 mg | ORAL_TABLET | Freq: Once | ORAL | Status: AC
  Administered 2013-01-18: 5 mg via ORAL
  Filled 2013-01-18: qty 1

## 2013-01-18 MED ORDER — FUROSEMIDE 40 MG PO TABS
40.0000 mg | ORAL_TABLET | Freq: Two times a day (BID) | ORAL | Status: DC
  Filled 2013-01-18: qty 1

## 2013-01-18 MED ORDER — PROMETHAZINE HCL 25 MG PO TABS
12.5000 mg | ORAL_TABLET | Freq: Four times a day (QID) | ORAL | Status: DC | PRN
  Administered 2013-01-18: 12.5 mg via ORAL
  Filled 2013-01-18: qty 1

## 2013-01-18 MED ORDER — FUROSEMIDE 10 MG/ML IJ SOLN: 40.0000 mg | Freq: Two times a day (BID) | INTRAMUSCULAR | Status: DC

## 2013-01-18 MED ORDER — POTASSIUM CHLORIDE CRYS ER 20 MEQ PO TBCR
40.0000 meq | EXTENDED_RELEASE_TABLET | Freq: Two times a day (BID) | ORAL | Status: DC
  Administered 2013-01-18: 40 meq via ORAL
  Filled 2013-01-18: qty 2

## 2013-01-18 MED ORDER — SODIUM CHLORIDE 0.9 % IJ SOLN
10.0000 mL | INTRAMUSCULAR | Status: DC | PRN
  Administered 2013-01-18 – 2013-01-19 (×4): 20 mL
  Administered 2013-01-20 (×2): 10 mL

## 2013-01-18 NOTE — Progress Notes (Addendum)
HeartMate 2 Rounding Note  Subjective:    Hubert is a 72 year old gentleman with a history of CABG (2003), Chronic systolic HF EF 20-25% %, S/P AICD (generator change 2011), hyperlipidemia, hypothyroidism, emphysema, OSA, intermittent atrial fibrillation (on coumadin). Quit smoking 2003. Uses CPAP and O2 every night. He is a disabled [employer]. He was referred to the AHF clinic by Dr. Elease Hashimoto and over the past couple of months his functional capacity has really declined. He has class IV heart failure symptoms and has not improved on OMM. His CPX in 11/13 showed a VO2 of 9.2%, VE/VCO2- 52.8, OUES 1.15 and RER= 1.02. We have been following him closely and have had multiple discussions about his options and the patient decided that he wanted to proceed with LVAD placement. I discussed the patient with Dr Romona Curls at [facility] who agreed that the patient was an acceptable DT VAD candidate- his pulm hx of asbestosis, low DLCO are a problem for transplant candidacy  Admitted 01/10/13 for optimization before LVAD implant with acute on chronic heart failure.. EF by LV gram 20% 01/07/13  POD#6 Doing well. Biggest complaint is bleeding hemorrhoids. Ambulating well with minimal SOB. On heparin for INR sub-therapeutic, this am 1.84. Off all drips. Co-ox 60. CT minimal output. Weight coming down nicely. Still a few pounds from baseline.   CVP 8-10 Co-ox 60 INR 1.84  LVAD INTERROGATION:  HeartMate II LVAD:  Flow 5.6 liters/min, speed 9200, power 5.8, PI 5.2  Few PI events  Objective:    Vital Signs:   Temp:  [97.8 F (36.6 C)-98.7 F (37.1 C)] 98.5 F (36.9 C) (04/21 0300) Pulse Rate:  [54-104] 94 (04/21 0500) Resp:  [16-25] 23 (04/21 0600) SpO2:  [97 %-100 %] 99 % (04/21 0500) Weight:  [229 lb 15 oz (104.3 kg)] 229 lb 15 oz (104.3 kg) (04/21 0400) Last BM Date: 01/16/13 Mean arterial Pressure 70-80's  Intake/Output:   Intake/Output Summary (Last 24 hours) at 01/18/13 0744 Last data filed at 01/18/13  0600  Gross per 24 hour  Intake    595 ml  Output   3120 ml  Net  -2525 ml     Physical Exam: General: NAD, Sitting up in chair HEENT: normal Neck: supple. CVP 8-9 . Carotids -  no bruits. No lymphadenopathy or thryomegaly appreciated. Cor: Mechanical heart sounds with LVAD hum present.-  Lungs: diminished in the bases. No wheeze. Abdomen: soft, nontender, no distention. No hepatosplenomegaly. No bruits or masses. + bowel sounds. Driveline: Dressing C/D/I and securement device intact Extremities: no cyanosis, clubbing, rash, edema extremities warm and well perfused Neuro: alert & orientedx3, cranial nerves grossly intact. moves all 4 extremities w/o difficulty  Telemetry: Afib 90-100s bpm  Labs: Basic Metabolic Panel:  Recent Labs Lab 01/12/13 2100  01/13/13 0350  01/14/13 0400 01/14/13 1837 01/15/13 0400 01/16/13 0355 01/17/13 0341 01/18/13 0330  NA 138  --  137  < > 129* 132* 129* 133* 130* 131*  K 3.5  --  4.4  < > 3.7 4.1 4.2 3.6 3.8 3.8  CL 104  --  107  < > 96 93* 95* 98 97 95*  CO2  --   --  25  --  28  --  31 29 30 29   GLUCOSE 152*  --  133*  < > 146* 173* 119* 115* 82 104*  BUN 11  --  11  < > 10 12 17  27* 24* 19  CREATININE 0.80  < > 0.74  < >  0.63 0.70 0.67 0.72 0.68 0.69  CALCIUM  --   --  8.7  --  9.2  --  9.3 9.2 9.2 9.7  MG  --   < > 2.3  < > 1.9  --  2.0 2.0 2.1 2.1  PHOS  --   --  1.6*  --  2.1*  --  2.4  --   --   --   < > = values in this interval not displayed.  Liver Function Tests:  Recent Labs Lab 01/13/13 0350 01/15/13 0830 01/17/13 0341  AST 42* 30 23  ALT 10 11 11   ALKPHOS  --  42 43  BILITOT 1.9* 0.7 0.5  PROT  --  5.8* 5.9*  ALBUMIN  --  2.8* 2.7*    Recent Labs Lab 01/15/13 0830  AMYLASE 16   No results found for this basename: AMMONIA,  in the last 168 hours  CBC:  Recent Labs Lab 01/14/13 0400  01/15/13 0400 01/15/13 1500 01/16/13 0355 01/17/13 0341 01/18/13 0330  WBC 12.4*  --  9.8 8.6 8.8 8.4 7.7  NEUTROABS  10.2*  --  8.1*  --  6.8 6.0 5.4  HGB 7.4*  < > 8.2* 7.8* 7.8* 8.0* 8.4*  HCT 22.6*  < > 25.0* 23.8* 24.7* 25.4* 25.9*  MCV 79.3  --  81.2 81.5 81.8 82.2 82.0  PLT 100*  --  97* 92* 109* 111* 148*  < > = values in this interval not displayed.  INR:  Recent Labs Lab 01/14/13 0400 01/15/13 0400 01/16/13 0355 01/17/13 0341 01/18/13 0330  INR 1.55* 1.33 1.23 1.43 1.83*    Other results:  EKG:   Imaging: Dg Chest Port 1 View  01/17/2013  *RADIOLOGY REPORT*  Clinical Data: Ventricular assist device.  PORTABLE CHEST - 1 VIEW  Comparison: 01/16/2013  Findings: Support devices including ventricular assist device are in stable position.  Cardiomegaly with vascular congestion, slightly improved.  Bibasilar atelectasis, also slightly improved. Small effusions are stable.  IMPRESSION: Slight improvement in vascular congestion and bibasilar atelectasis.  Otherwise no change.   Original Report Authenticated By: Charlett Nose, M.D.      Medications:     Scheduled Medications: . antiseptic oral rinse  15 mL Mouth Rinse BID  . aspirin EC  325 mg Oral Daily  . bisacodyl  10 mg Oral Daily  . budesonide-formoterol  2 puff Inhalation BID  . cefUROXime (ZINACEF)  IV  1.5 g Intravenous Q12H  . citalopram  20 mg Oral Daily  . docusate sodium  200 mg Oral Daily  . feeding supplement  1 Container Oral TID WC  . ferrous fumarate-b12-vitamic C-folic acid  1 capsule Oral TID PC  . furosemide  40 mg Intravenous BID  . insulin aspart  0-24 Units Subcutaneous Q4H  . insulin glargine  12 Units Subcutaneous QHS  . levothyroxine  50 mcg Oral QHS  . pantoprazole  40 mg Oral Daily  . tiotropium  18 mcg Inhalation Daily  . Warfarin - Pharmacist Dosing Inpatient   Does not apply q1800    Infusions: . sodium chloride 20 mL/hr at 01/17/13 0311  . sodium chloride    . heparin 500 Units/hr (01/17/13 1000)  . milrinone 0.125 mcg/kg/min (01/16/13 1100)  . nitroGLYCERIN Stopped (01/12/13 1530)    PRN  Medications: acetaminophen, midazolam, morphine injection, oxyCODONE, sodium chloride, sodium chloride, traMADol   Assessment:   1) Acute on chronic HF - class IV  2) Chronic AF on coumadin  3) ICM, EF 20%        S/p LVAD Heartmate II implant 01/12/13 4) CAD  S/p CABG 2003  5) COPD with depressed DLCO  CPAP at night  6) HLD  7) Hypothyroidism  8) Expected blood loss anemia- LDH transiently increased prob due to banked blood breakdown but will follow for evidence of hemolysis. Continue oral iron and folate  Plan/Discussion:    POD #6  Doing well and ambulating in the halls. Will discontinue heparin and continue coumadin. Will restart low dose metoprolol. Volume status improving. Continue IV diuretics one more day and then switch to PO. Continue to ambulate.   Will start extensive education with wife and patient today.   I reviewed the LVAD parameters from today, and compared the results to the patient's prior recorded data. No programming changes were made. The LVAD is functioning within specified parameters. Nursing/patient performing LVAD self-test daily. LVAD interrogation was negative for any significant power changes, alarms, or PI events or speed drops. LVAD equipment check completed and is in good working order. Backup equipment present. LVAD education done on emergency procedures and precautions and reviewed drive line exit site care.  Aundria Rud 01/18/2013, 7:44 AM Length of Stay: 8  Patient seen and examined with Ulla Potash, NP. We discussed all aspects of the encounter. I agree with the assessment and plan as stated above.   Doing very well. Agree with continuing IV lasix one more day. Can stop heparin. Continue coumadin. Cotninue ambuataion and education. Amio off.   Daniel Bensimhon,MD 1:32 PM

## 2013-01-18 NOTE — Progress Notes (Signed)
Progressing well after redo-sternotomy and HM-2 LVAD implantation for Destination therapy Will transfer to step down and proceed with VAD education Pulmonary status good despite sig chronic lung disease  patient examined and medical record reviewed,agree with above note. VAN TRIGT III,[name] 01/18/2013

## 2013-01-18 NOTE — Progress Notes (Signed)
CARDIAC REHAB PHASE I   PRE:  Rate/Rhythm: 91 afib    BP: sitting 90    SaO2: 95 RA  MODE:  Ambulation: 180 ft   POST:  Rate/Rhythm: 97 afib    BP: sitting 90     SaO2: 96 RA  Pt c/o constant nausea. Reluctant to walk. Used RW and assist x2. Tires easily. Return to EOB, then decided on recliner.  Will f/u tomorrow as x1. 1500-1520  [name] [place] CES, ACSM 01/18/2013 3:14 PM

## 2013-01-18 NOTE — Progress Notes (Signed)
PT Cancellation Note  Patient Details Name: [name] MRN: [id number] DOB: 08/11/1941   Cancelled Treatment:    Reason Eval/Treat Not Completed: Fatigue/lethargy limiting ability to participate. (pt c/o nausea, states that he does not feel up for walking right not. Politely declines PT at this time.)   Fabio Asa 01/18/2013, 11:05 AM Charlotte Crumb, PT DPT  [phone]

## 2013-01-18 NOTE — Progress Notes (Signed)
Spoke to patient about CPAP. He said he does not wear one at home, and will not be wearing a CPAP while in the hospital.

## 2013-01-18 NOTE — Progress Notes (Signed)
patient examined and medical record reviewed,agree with above note. VAN TRIGT III,Lisel Siegrist 01/18/2013   

## 2013-01-18 NOTE — Progress Notes (Signed)
Patient transferred over today.  Assessed patient's ambulation status.  Patient walked twice today. Educated patient on starting three walks per day tomorrow.  Will continue to monitor.

## 2013-01-18 NOTE — Progress Notes (Addendum)
NUTRITION FOLLOW UP  Intervention:    Change Ensure Pudding to Ensure Complete per pt request RD to follow for nutrition care plan  Nutrition Dx:   Inadequate oral intake now related to post-op nausea as evidenced by pt report.  New Goal:   Intake to meet >90% of estimated nutrition needs.  Monitor:   Po intake, weight, labs, I/O's, supplement tolerance  Assessment:   Patient s/p procedure 4/15: INSERTION OF IMPLANTABLE LEFT VENTRICULAR ASSIST DEVICE  Extubated 4/16. Palliative team visited pt 4/17 - as of that meeting pt reports that he is open to all available medical interventions.  Per HF rounding note from today, pt's volume status improving, plan to continue IV diuretics x 1 day and then switch to PO.  Advanced to Heart Healthy diet on 4/19. Ordered for Ensure Pudding over the weekend. Having intermittent nausea. Pt reports that the Ensure Pudding "tastes like glue" and would much rather have the Ensure Complete - RD to order. Pt states that he is worried about his nausea but that the MD states that it will take time for him to recover from his surgery. RD provided emotional support.  Height: Ht Readings from Last 1 Encounters:  01/12/13 6' (1.829 m)    Weight Status:   Wt Readings from Last 1 Encounters:  01/18/13 229 lb 15 oz (104.3 kg)  Wt decreasing; likely 2/2 inadequate nutrition and ongoing diuresis.  Body mass index is 31.18 kg/(m^2). Obese Class I.  Re-estimated needs:  Kcal: 2000 - 2200 Protein: 110 - 130  gm Fluid: per MD  Skin: sternal incision  Diet Order: Cardiac   Intake/Output Summary (Last 24 hours) at 01/18/13 1340 Last data filed at 01/18/13 1000  Gross per 24 hour  Intake    520 ml  Output   2520 ml  Net  -2000 ml    Labs:   Recent Labs Lab 01/13/13 0350  01/14/13 0400  01/15/13 0400 01/16/13 0355 01/17/13 0341 01/18/13 0330  NA 137  < > 129*  < > 129* 133* 130* 131*  K 4.4  < > 3.7  < > 4.2 3.6 3.8 3.8  CL 107  < > 96  <  > 95* 98 97 95*  CO2 25  --  28  --  31 29 30 29   BUN 11  < > 10  < > 17 27* 24* 19  CREATININE 0.74  < > 0.63  < > 0.67 0.72 0.68 0.69  CALCIUM 8.7  --  9.2  --  9.3 9.2 9.2 9.7  MG 2.3  < > 1.9  --  2.0 2.0 2.1 2.1  PHOS 1.6*  --  2.1*  --  2.4  --   --   --   GLUCOSE 133*  < > 146*  < > 119* 115* 82 104*  < > = values in this interval not displayed.  CBG (last 3)   Recent Labs  01/18/13 0307 01/18/13 0804 01/18/13 1159  GLUCAP 108* 110* 115*    Scheduled Meds: . aspirin EC  325 mg Oral Daily  . bisacodyl  10 mg Oral Daily  . budesonide-formoterol  2 puff Inhalation BID  . citalopram  20 mg Oral Daily  . docusate sodium  200 mg Oral Daily  . feeding supplement  1 Container Oral TID WC  . ferrous fumarate-b12-vitamic C-folic acid  1 capsule Oral TID PC  . furosemide  40 mg Intravenous BID  . hydrocortisone   Rectal TID  .  insulin aspart  0-24 Units Subcutaneous Q4H  . insulin glargine  12 Units Subcutaneous QHS  . levothyroxine  50 mcg Oral QHS  . metoCLOPramide  10 mg Oral TID AC  . metoprolol tartrate  12.5 mg Oral BID  . pantoprazole  40 mg Oral Daily  . tiotropium  18 mcg Inhalation Daily  . Warfarin - Pharmacist Dosing Inpatient   Does not apply q1800    Continuous Infusions: none    Jarold Motto MS, RD, LDN Pager: [phone] After-hours pager: [phone]

## 2013-01-18 NOTE — Progress Notes (Signed)
AM PTT>200.  Blood sample drawn through PICC that heparin was running through.  Lab staff to re-draw sample by venous access.

## 2013-01-18 NOTE — Progress Notes (Signed)
UR Completed.  Audrie Kuri Jane [phone] 01/18/2013  

## 2013-01-18 NOTE — Progress Notes (Signed)
ANTICOAGULATION CONSULT NOTE   Pharmacy Consult for warfarin  Indication: atrial fibrillation and LVAD placed 4/15  No Known Allergies  Patient Measurements: Height: 6' (182.9 cm) Weight: 229 lb 15 oz (104.3 kg) IBW/kg (Calculated) : 77.6   Recent Labs  01/16/13 0355 01/17/13 0341 01/18/13 0330 01/18/13 0500  HGB 7.8* 8.0* 8.4*  --   HCT 24.7* 25.4* 25.9*  --   PLT 109* 111* 148*  --   APTT  --   --  >200* 64*  LABPROT 15.3* 17.1* 20.5*  --   INR 1.23 1.43 1.83*  --   CREATININE 0.72 0.68 0.69  --     Estimated Creatinine Clearance: 105.8 ml/min (by C-G formula based on Cr of 0.69).  Medications:  PTA Warfarin 5mg  daily except 2.5mg  on Thursday  Assessment: 72 year old male now POD#6 from LVAD. Patient was on coumadin prior to admission for afib and was on heparin bridge prior to surgery. INR still sub-therapautic at 1.83, but trending up after 2 doses of warfarin 7.5mg . MD started low-dose IV heparin at 500 units/hr yesterday which was subsequently stopped this AM. Hgb stable, platelets improving. No bleeding noted in chart.  Goal of Therapy:  INR 2-3 Monitor platelets by anticoagulation protocol: Yes   Plan:  1) Warfarin 5mg  x 1 tonight 2) F/u daily INR 3) Continue to monitor signs/symptoms of bleeding   Benjaman Pott, PharmD, BCPS 01/18/2013   3:05 PM

## 2013-01-19 DIAGNOSIS — I5023 Acute on chronic systolic (congestive) heart failure: Secondary | ICD-10-CM

## 2013-01-19 DIAGNOSIS — Z95818 Presence of other cardiac implants and grafts: Secondary | ICD-10-CM

## 2013-01-19 LAB — GLUCOSE, CAPILLARY
Glucose-Capillary: 106 mg/dL — ABNORMAL HIGH (ref 70–99)
Glucose-Capillary: 109 mg/dL — ABNORMAL HIGH (ref 70–99)
Glucose-Capillary: 117 mg/dL — ABNORMAL HIGH (ref 70–99)
Glucose-Capillary: 121 mg/dL — ABNORMAL HIGH (ref 70–99)
Glucose-Capillary: 123 mg/dL — ABNORMAL HIGH (ref 70–99)
Glucose-Capillary: 135 mg/dL — ABNORMAL HIGH (ref 70–99)

## 2013-01-19 LAB — PRO B NATRIURETIC PEPTIDE: Pro B Natriuretic peptide (BNP): 2665 pg/mL — ABNORMAL HIGH (ref 0–125)

## 2013-01-19 LAB — BASIC METABOLIC PANEL
Calcium: 10 mg/dL (ref 8.4–10.5)
Creatinine, Ser: 0.75 mg/dL (ref 0.50–1.35)
GFR calc non Af Amer: >90 mL/min (ref >90)
Glucose, Bld: 99 mg/dL (ref 70–99)
Sodium: 132 mEq/L — ABNORMAL LOW (ref 135–145)

## 2013-01-19 LAB — CARBOXYHEMOGLOBIN
Carboxyhemoglobin: 2.7 % — ABNORMAL HIGH (ref 0.5–1.5)
Methemoglobin: 1.1 % (ref 0.0–1.5)
O2 Saturation: 62.3 %
Total hemoglobin: 8.5 g/dL — ABNORMAL LOW (ref 13.5–18.0)

## 2013-01-19 LAB — LACTATE DEHYDROGENASE: LDH: 310 U/L — ABNORMAL HIGH (ref 94–250)

## 2013-01-19 LAB — PROTIME-INR: Prothrombin Time: 24.5 seconds — ABNORMAL HIGH (ref 11.6–15.2)

## 2013-01-19 MED ORDER — FUROSEMIDE 40 MG PO TABS
40.0000 mg | ORAL_TABLET | Freq: Every day | ORAL | Status: DC
  Administered 2013-01-19 – 2013-01-21 (×3): 40 mg via ORAL
  Filled 2013-01-19 (×3): qty 1

## 2013-01-19 MED ORDER — LACTULOSE 10 GM/15ML PO SOLN
30.0000 g | Freq: Every day | ORAL | Status: AC
  Administered 2013-01-19: 30 g via ORAL
  Filled 2013-01-19: qty 45

## 2013-01-19 MED ORDER — WARFARIN SODIUM 5 MG PO TABS
5.0000 mg | ORAL_TABLET | Freq: Once | ORAL | Status: AC
  Administered 2013-01-19: 5 mg via ORAL
  Filled 2013-01-19: qty 1

## 2013-01-19 NOTE — Progress Notes (Signed)
HeartMate 2 Rounding Note postop day 7 status post redo sternotomy and HeartMate 2 implantation  Subjective:    [name] is a 72 year old gentleman with a history of CABG (2003), Chronic systolic HF EF 20-25% %, S/P AICD (generator change 2011), hyperlipidemia, hypothyroidism, emphysema, OSA, intermittent atrial fibrillation (on coumadin). Quit smoking 2003. Uses CPAP and O2 every night. He is a disabled [employer]. He was referred to the AHF clinic by Dr. Elease Hashimoto and over the past couple of months his functional capacity has really declined. He has class IV heart failure symptoms and has not improved on OMM. His CPX in 11/13 showed a VO2 of 9.2%, VE/VCO2- 52.8, OUES 1.15 and RER= 1.02. We have been following him closely and have had multiple discussions about his options and the patient decided that he wanted to proceed with LVAD placement. I discussed the patient with Dr Romona Curls at [facility] who agreed that the patient was an acceptable DT VAD candidate- his pulm hx of asbestosis, low DLCO are a problem for transplant candidacy  Admitted 01/10/13 for optimization before LVAD implant with acute on chronic heart failure.. EF by LV gram 20% 01/07/13  POD#7 Walked 300 ft today- nausea resolved Inotropes all weaned off-co-ox this a.m. 60%  Controlled afib Wt down 1 lbs now on po lasix Hemoglobin up to 8.2 on oral iron and diuresis   Minimal PI events, , continue LVAD speed  at 9200 RPM Pump pocket drain removed Nausea resolved, KUB reviewed   CVP -10-12 Co-ox 58 off milrinone INR 1. 4  LVAD INTERROGATION:  HeartMate II LVAD:  Flow 5.4 liters/min, speed 9000, power 5.5, PI 5.1  No PI events  Objective:    Vital Signs:   Temp:  [98 F (36.7 C)-98.5 F (36.9 C)] 98 F (36.7 C) (04/22 1340) Pulse Rate:  [97-100] 98 (04/22 1122) Resp:  [19] 19 (04/22 0430) SpO2:  [82 %-96 %] 92 % (04/22 1340) Weight:  [222 lb 3.6 oz (100.8 kg)] 222 lb 3.6 oz (100.8 kg) (04/22 0430) Last BM Date: 01/18/13  (small) Mean arterial Pressure 70-80's  Intake/Output:   Intake/Output Summary (Last 24 hours) at 01/19/13 1425 Last data filed at 01/19/13 1300  Gross per 24 hour  Intake    820 ml  Output    975 ml  Net   -155 ml     Physical Exam: General: NAD, Laying in bed HEENT: normal Neck: supple. CVP 8-9 . Carotids -  no bruits. No lymphadenopathy or thryomegaly appreciated. Cor: Mechanical heart sounds with LVAD hum present.-  Lungs: diminished in the bases Abdomen: soft, nontender, no distention. No hepatosplenomegaly. No bruits or masses. no bowel sounds. Driveline: Dressing C/D/I and securement device intact Extremities: no cyanosis, clubbing, rash, edema extremities warm and well perfused Neuro: alert & orientedx3, cranial nerves grossly intact. moves all 4 extremities w/o difficulty Sternal incision clean and dry   Telemetry: Afib 95 bpm  Labs: Basic Metabolic Panel:  Recent Labs Lab 01/12/13 2100  01/13/13 0350  01/14/13 0400  01/15/13 0400 01/16/13 0355 01/17/13 0341 01/18/13 0330 01/19/13 0515  NA 138  --  137  < > 129*  < > 129* 133* 130* 131* 132*  K 3.5  --  4.4  < > 3.7  < > 4.2 3.6 3.8 3.8 4.4  CL 104  --  107  < > 96  < > 95* 98 97 95* 97  CO2  --   --  25  --  28  --  31 29 30 29 27   GLUCOSE 152*  --  133*  < > 146*  < > 119* 115* 82 104* 99  BUN 11  --  11  < > 10  < > 17 27* 24* 19 25*  CREATININE 0.80  < > 0.74  < > 0.63  < > 0.67 0.72 0.68 0.69 0.75  CALCIUM  --   --  8.7  --  9.2  --  9.3 9.2 9.2 9.7 10.0  MG  --   < > 2.3  < > 1.9  --  2.0 2.0 2.1 2.1  --   PHOS  --   --  1.6*  --  2.1*  --  2.4  --   --   --   --   < > = values in this interval not displayed.  Liver Function Tests:  Recent Labs Lab 01/13/13 0350 01/15/13 0830 01/17/13 0341  AST 42* 30 23  ALT 10 11 11   ALKPHOS  --  42 43  BILITOT 1.9* 0.7 0.5  PROT  --  5.8* 5.9*  ALBUMIN  --  2.8* 2.7*    Recent Labs Lab 01/15/13 0830  AMYLASE 16   No results found for this  basename: AMMONIA,  in the last 168 hours  CBC:  Recent Labs Lab 01/14/13 0400  01/15/13 0400 01/15/13 1500 01/16/13 0355 01/17/13 0341 01/18/13 0330  WBC 12.4*  --  9.8 8.6 8.8 8.4 7.7  NEUTROABS 10.2*  --  8.1*  --  6.8 6.0 5.4  HGB 7.4*  < > 8.2* 7.8* 7.8* 8.0* 8.4*  HCT 22.6*  < > 25.0* 23.8* 24.7* 25.4* 25.9*  MCV 79.3  --  81.2 81.5 81.8 82.2 82.0  PLT 100*  --  97* 92* 109* 111* 148*  < > = values in this interval not displayed.  INR:  Recent Labs Lab 01/15/13 0400 01/16/13 0355 01/17/13 0341 01/18/13 0330 01/19/13 0515  INR 1.33 1.23 1.43 1.83* 2.33*    Other results:  EKG:   Imaging: Dg Chest 2 View  01/18/2013  *RADIOLOGY REPORT*  Clinical Data: Left ventricular assist device.  CHEST - 2 VIEW  Comparison: Chest 01/17/2013.  Findings: There is cardiomegaly and vascular congestion.  Very small bilateral pleural effusions are noted.  Mild basilar atelectasis is seen.  No pneumothorax.  IMPRESSION:  1.  Small bilateral pleural effusions and mild basilar atelectasis. 2.  Cardiomegaly and vascular congestion.   Original Report Authenticated By: Holley Dexter, M.D.    Dg Abd Portable 1v  01/18/2013  *RADIOLOGY REPORT*  Clinical Data: Nausea.  Status post left ventricular assist device placement.  PORTABLE ABDOMEN - 1 VIEW  Comparison: Chest film of earlier in the day.  Findings: 2 supine portable views.  Left ventricular assist device again identified.  Cardiomegaly.  Pleural-based calcifications again identified bilaterally.  Gas filled bowel loop in the lower abdomen is favored to be the sigmoid colon, normal size.  Otherwise, paucity of bowel gas. There is a moderate amount of stool throughout the transverse and splenic flexure of the colon.  No gross free intraperitoneal air. No pneumatosis.  IMPRESSION: Paucity of abdominal bowel gas, without evidence of free intraperitoneal air or bowel obstruction.  Pleural-based calcifications, again suggesting asbestos related  pleural disease.  Moderate amount of stool within the colon.  Question constipation.   Original Report Authenticated By: Jeronimo Greaves, M.D.      Medications:     Scheduled Medications: . aspirin EC  325 mg Oral Daily  . bisacodyl  10 mg Oral Daily  . budesonide-formoterol  2 puff Inhalation BID  . citalopram  20 mg Oral Daily  . docusate sodium  200 mg Oral Daily  . feeding supplement  237 mL Oral BID BM  . ferrous fumarate-b12-vitamic C-folic acid  1 capsule Oral TID PC  . furosemide  40 mg Oral Daily  . hydrocortisone   Rectal TID  . insulin aspart  0-24 Units Subcutaneous Q4H  . insulin glargine  12 Units Subcutaneous QHS  . levothyroxine  50 mcg Oral QHS  . metoCLOPramide  10 mg Oral TID AC  . metoprolol tartrate  12.5 mg Oral BID  . pantoprazole  40 mg Oral Daily  . potassium chloride  20 mEq Oral BID  . tiotropium  18 mcg Inhalation Daily  . warfarin  5 mg Oral ONCE-1800  . Warfarin - Pharmacist Dosing Inpatient   Does not apply q1800    Infusions:    PRN Medications: acetaminophen, oxyCODONE, sodium chloride, traMADol   Assessment:   1) Acute on chronic HF - class IV  2) Chronic AF on coumadin  3) ICM, EF 20%        S/p LVAD Heartmate II implant 01/12/13 4) CAD  S/p CABG 2003  5) COPD with depressed DLCO  CPAP at night  6) HLD  7) Hypothyroidism  8) Expected blood loss anemia- LDH transiently increased prob due to banked blood breakdown but will follow for evidence of hemolysis. Continue oral iron and folate  Plan/Discussion:    POD #7  Plan Anticoagulation reached goal 2-2.5 Much stronger now walking tol beta blocker for afib controll Will need echo ramp study to set speed for DC home Will bet CT chest w/o contrast prior to DC home Length of Stay: 9   I reviewed the LVAD parameters from today, and compared the results to the patient's prior recorded data. No programming changes were made. The LVAD is functioning within specified parameters. Nursing  performing LVAD self-test daily. LVAD interrogation was negative for any significant power changes, alarms, or PI events or speed drops. LVAD equipment check completed and is in good working order. Backup equipment present. LVAD education done on emergency procedures and precautions and reviewed drive line exit site care.  VAN TRIGT III,Taquita Demby 01/19/2013, 2:25 PM

## 2013-01-19 NOTE — Progress Notes (Signed)
Met with patient and wife today and started VAD education. We went over the VAD equipment extensively and discussed hazard and advisory alarms and how to address them. Mrs. Barren observed me do the sterile driveline dressing change and will do the dressing tomorrow under my supervision. I sent some sterile gloves home with her to practice putting them on. Will return tomorrow to finish education and practice switching the controller out with the mock look and his back-up. Patient states that he understands who to call for a VAD emergency and understands what would be considered a VAD emergency.

## 2013-01-19 NOTE — Progress Notes (Signed)
SATURATION QUALIFICATIONS: (This note is used to comply with regulatory documentation for home oxygen)  Patient Saturations on Room Air at Rest = 91%  Patient Saturations on Room Air while Ambulating = 82%  Patient Saturations on 3 Liters of oxygen while Ambulating = 95%  Please briefly explain why patient needs home oxygen: Pt desats on RA with ambulation and does not desat when O2 in place with ambulation.  Recommend home O2 for use with ambulation/activity.   Thanks. [facility] [phone] (pager)

## 2013-01-19 NOTE — Progress Notes (Signed)
ANTICOAGULATION CONSULT NOTE   Pharmacy Consult for warfarin  Indication: atrial fibrillation and LVAD placed 4/15  No Known Allergies  Patient Measurements: Height: 6' (182.9 cm) Weight: 222 lb 3.6 oz (100.8 kg) IBW/kg (Calculated) : 77.6   Recent Labs  01/17/13 0341 01/18/13 0330 01/18/13 0500 01/19/13 0515  HGB 8.0* 8.4*  --   --   HCT 25.4* 25.9*  --   --   PLT 111* 148*  --   --   APTT  --  >200* 64*  --   LABPROT 17.1* 20.5*  --  24.5*  INR 1.43 1.83*  --  2.33*  CREATININE 0.68 0.69  --  0.75    Estimated Creatinine Clearance: 104.1 ml/min (by C-G formula based on Cr of 0.75).  Medications:  PTA Warfarin 5mg  daily except 2.5mg  on Thursday  Assessment: 72 year old male now POD#7 from LVAD. Patient was on coumadin prior to admission for afib and was on heparin bridge prior to surgery. INR  trending up after 2 doses of warfarin 7.5mg , in therapeutic range today.  Hgb stable, platelets improving. No bleeding noted in chart except for complaints of bleeding hemorrhoids.  Hgb low but appears stable, platelet count rising.  Goal of Therapy:  INR 2-3 Monitor platelets by anticoagulation protocol: Yes   Plan:  1) Repeat warfarin 5mg  x 1 tonight (consistent with home dose) 2) F/u daily INR 3) Continue to monitor signs/symptoms of bleeding  Tad Moore, BCPS  Clinical Pharmacist Pager [phone]  01/19/2013 1:34 PM

## 2013-01-19 NOTE — Progress Notes (Signed)
Patient was given his Heart Failure booklet and educated on the content.  Patient verbalized understanding of the information. Patient was shown the Heart Failure video. Will continue to monitor.

## 2013-01-19 NOTE — Progress Notes (Signed)
CARDIAC REHAB PHASE I   PRE:  Rate/Rhythm: 83 Afib  BP:  Supine:   Sitting: 72/ dopplered  Standing:    SaO2: 93 RA  MODE:  Ambulation: 240 ft   POST:  Rate/Rhythm: 102 Afib  BP:  Supine:   Sitting: 80/ dopplered  Standing:    SaO2: 98 3L 1400-1440 Assisted X 2 used O2 3L and walker to ambulate. Gait steady with walker. Pt does tire and c/o of SOB. He took one standing rest stop due to SOB. He states that his SOB is less with the O2 on. Pt able to walk 240 feet. Pt to recliner after walk with call light in reach and wife present.  Melina Copa RN 01/19/2013 2:36 PM

## 2013-01-19 NOTE — Progress Notes (Signed)
    VAD Teaching Note:   Patient along with Kara Mead  (wife/primary caregiver) received VAD teaching and education and verbalize the understanding of the HM II system operation. Mr and Mrs Akamine have both taken the education verification test and passed. Discharge date is set for later this week. The patient's wife has been trained on the driveline management sterile dressing change and has performed the dressing under my supervision. I have reviewed the daily flow sheet that the patient will record his VAD parameters along with his weights and driveline dressing status with patient and family.   Patient and wife have had extensive education about:  1) Who to call for a VAD emergency.  2) What is considered a VAD emergency.  3) What back-up equipment needs to be with him at  all times.  4) How to take care of his driveline and equipment.  5) How to change his system controller.  6) The restrictions he has with having the pump.  7) How to change power sources.  8) What the critical and advisory alarms are for her VAD.  system.  9) The importance of Coumadin and following up for clinic appointments.   Mr and Mrs Holleman report feeling comfortable with his VAD system. Discharge information materials given to patient include the VAD Booklet. All questions have been answered and patient, and wife have performed a system controller exchange with the patient's back up system controller and my mock controller.

## 2013-01-19 NOTE — Progress Notes (Signed)
HeartMate 2 Rounding Note  Subjective:    [name] is a 72 year old gentleman with a history of CABG (2003), Chronic systolic HF EF 20-25% %, S/P AICD (generator change 2011), hyperlipidemia, hypothyroidism, emphysema, OSA, intermittent atrial fibrillation (on coumadin). Quit smoking 2003. Uses CPAP and O2 every night. He is a disabled [employer]. He was referred to the AHF clinic by Dr. Elease Hashimoto and over the past couple of months his functional capacity has really declined. He has class IV heart failure symptoms and has not improved on OMM. His CPX in 11/13 showed a VO2 of 9.2%, VE/VCO2- 52.8, OUES 1.15 and RER= 1.02. We have been following him closely and have had multiple discussions about his options and the patient decided that he wanted to proceed with LVAD placement. I discussed the patient with Dr Romona Curls at [facility] who agreed that the patient was an acceptable DT VAD candidate- his pulm hx of asbestosis, low DLCO are a problem for transplant candidacy  Admitted 01/10/13 for optimization before LVAD implant with acute on chronic heart failure.. EF by LV gram 20% 01/07/13  POD#7  Transferred to PTCU yesterday. Patient was having nausea yesterday and received PO phenergan, which caused him to be drowsy and not want to ambulate. KUB showed no obstruction, but a large amount of stool, possible constipation. Weight down 7 lbs, back to baseline.  Doing well. Biggest complaint is bleeding hemorrhoids. Ambulating well with minimal SOB. On heparin for INR sub-therapeutic, this am 1.84. Off all drips. Co-ox 60. CT minimal output. Weight coming down nicely. Still a few pounds from baseline.   Co-ox (pending) INR 2.33 LDH 310  LVAD INTERROGATION:  HeartMate II LVAD:  Flow 5.2 liters/min, speed 9200, power 5.3, PI 4.8  Few PI events  Objective:    Vital Signs:   Temp:  [97.1 F (36.2 C)-98.5 F (36.9 C)] 98.5 F (36.9 C) (04/22 0430) Pulse Rate:  [87-103] 97 (04/21 2120) Resp:  [19-23] 19 (04/22  0430) SpO2:  [91 %-96 %] 95 % (04/22 0430) Weight:  [222 lb 3.6 oz (100.8 kg)] 222 lb 3.6 oz (100.8 kg) (04/22 0430) Last BM Date: 01/18/13 Mean arterial Pressure 80's  Intake/Output:   Intake/Output Summary (Last 24 hours) at 01/19/13 0713 Last data filed at 01/18/13 2326  Gross per 24 hour  Intake    410 ml  Output   1475 ml  Net  -1065 ml     Physical Exam: General: NAD, Laying in chair HEENT: normal Neck: supple. JVD flat . Carotids -  no bruits. No lymphadenopathy or thryomegaly appreciated. Cor: Mechanical heart sounds with LVAD hum present Lungs: CTA. No wheeze. Abdomen: soft, nontender, mild distention. No hepatosplenomegaly. No bruits or masses. + bowel sounds. Driveline: Dressing C/D/I and securement device intact Extremities: no cyanosis, clubbing, rash, edema extremities warm and well perfused Neuro: alert & orientedx3, cranial nerves grossly intact. moves all 4 extremities w/o difficulty  Telemetry: Afib 90-100s bpm  Labs: Basic Metabolic Panel:  Recent Labs Lab 01/12/13 2100  01/13/13 0350  01/14/13 0400  01/15/13 0400 01/16/13 0355 01/17/13 0341 01/18/13 0330 01/19/13 0515  NA 138  --  137  < > 129*  < > 129* 133* 130* 131* 132*  K 3.5  --  4.4  < > 3.7  < > 4.2 3.6 3.8 3.8 4.4  CL 104  --  107  < > 96  < > 95* 98 97 95* 97  CO2  --   --  25  --  28  --  31 29 30 29 27   GLUCOSE 152*  --  133*  < > 146*  < > 119* 115* 82 104* 99  BUN 11  --  11  < > 10  < > 17 27* 24* 19 25*  CREATININE 0.80  < > 0.74  < > 0.63  < > 0.67 0.72 0.68 0.69 0.75  CALCIUM  --   --  8.7  --  9.2  --  9.3 9.2 9.2 9.7 10.0  MG  --   < > 2.3  < > 1.9  --  2.0 2.0 2.1 2.1  --   PHOS  --   --  1.6*  --  2.1*  --  2.4  --   --   --   --   < > = values in this interval not displayed.  Liver Function Tests:  Recent Labs Lab 01/13/13 0350 01/15/13 0830 01/17/13 0341  AST 42* 30 23  ALT 10 11 11   ALKPHOS  --  42 43  BILITOT 1.9* 0.7 0.5  PROT  --  5.8* 5.9*  ALBUMIN  --   2.8* 2.7*    Recent Labs Lab 01/15/13 0830  AMYLASE 16   No results found for this basename: AMMONIA,  in the last 168 hours  CBC:  Recent Labs Lab 01/14/13 0400  01/15/13 0400 01/15/13 1500 01/16/13 0355 01/17/13 0341 01/18/13 0330  WBC 12.4*  --  9.8 8.6 8.8 8.4 7.7  NEUTROABS 10.2*  --  8.1*  --  6.8 6.0 5.4  HGB 7.4*  < > 8.2* 7.8* 7.8* 8.0* 8.4*  HCT 22.6*  < > 25.0* 23.8* 24.7* 25.4* 25.9*  MCV 79.3  --  81.2 81.5 81.8 82.2 82.0  PLT 100*  --  97* 92* 109* 111* 148*  < > = values in this interval not displayed.  INR:  Recent Labs Lab 01/15/13 0400 01/16/13 0355 01/17/13 0341 01/18/13 0330 01/19/13 0515  INR 1.33 1.23 1.43 1.83* 2.33*    Other results:     Imaging: Dg Chest 2 View  01/18/2013  *RADIOLOGY REPORT*  Clinical Data: Left ventricular assist device.  CHEST - 2 VIEW  Comparison: Chest 01/17/2013.  Findings: There is cardiomegaly and vascular congestion.  Very small bilateral pleural effusions are noted.  Mild basilar atelectasis is seen.  No pneumothorax.  IMPRESSION:  1.  Small bilateral pleural effusions and mild basilar atelectasis. 2.  Cardiomegaly and vascular congestion.   Original Report Authenticated By: Holley Dexter, M.D.    Dg Abd Portable 1v  01/18/2013  *RADIOLOGY REPORT*  Clinical Data: Nausea.  Status post left ventricular assist device placement.  PORTABLE ABDOMEN - 1 VIEW  Comparison: Chest film of earlier in the day.  Findings: 2 supine portable views.  Left ventricular assist device again identified.  Cardiomegaly.  Pleural-based calcifications again identified bilaterally.  Gas filled bowel loop in the lower abdomen is favored to be the sigmoid colon, normal size.  Otherwise, paucity of bowel gas. There is a moderate amount of stool throughout the transverse and splenic flexure of the colon.  No gross free intraperitoneal air. No pneumatosis.  IMPRESSION: Paucity of abdominal bowel gas, without evidence of free intraperitoneal air  or bowel obstruction.  Pleural-based calcifications, again suggesting asbestos related pleural disease.  Moderate amount of stool within the colon.  Question constipation.   Original Report Authenticated By: Jeronimo Greaves, M.D.      Medications:     Scheduled Medications: .  aspirin EC  325 mg Oral Daily  . bisacodyl  10 mg Oral Daily  . budesonide-formoterol  2 puff Inhalation BID  . citalopram  20 mg Oral Daily  . docusate sodium  200 mg Oral Daily  . feeding supplement  237 mL Oral BID BM  . ferrous fumarate-b12-vitamic C-folic acid  1 capsule Oral TID PC  . furosemide  40 mg Oral BID  . hydrocortisone   Rectal TID  . insulin aspart  0-24 Units Subcutaneous Q4H  . insulin glargine  12 Units Subcutaneous QHS  . levothyroxine  50 mcg Oral QHS  . metoCLOPramide  10 mg Oral TID AC  . metoprolol tartrate  12.5 mg Oral BID  . pantoprazole  40 mg Oral Daily  . potassium chloride  20 mEq Oral BID  . tiotropium  18 mcg Inhalation Daily  . Warfarin - Pharmacist Dosing Inpatient   Does not apply q1800    Infusions:    PRN Medications: acetaminophen, oxyCODONE, sodium chloride, traMADol   Assessment:   1) Acute on chronic HF - class IV  2) Chronic AF on coumadin  3) ICM, EF 20%        S/p LVAD Heartmate II implant 01/12/13 4) CAD  S/p CABG 2003  5) COPD with depressed DLCO  CPAP at night  6) HLD  7) Hypothyroidism  8) Expected blood loss anemia- LDH transiently increased prob due to banked blood breakdown but will follow for evidence of hemolysis. Continue oral iron and folate  Plan/Discussion:    POD #7  Progressing well. Weight appears back to baseline will switch to PO lasix. Awaiting Co-ox. INR now therapeutic, will continue to have pharmacy dose. Low dose beta blocker started yesterday and tolerating well, MAPs 80s.  Goal for today is for patient to ambulate TID and be alert enough for education on VAD. Wife will be here at 11:00 for VAD education as well.   I  reviewed the LVAD parameters from today, and compared the results to the patient's prior recorded data. No programming changes were made. The LVAD is functioning within specified parameters. Nursing/patient performing LVAD self-test daily. LVAD interrogation was negative for any significant power changes, alarms, or PI events or speed drops. LVAD equipment check completed and is in good working order. Backup equipment present. LVAD education done on emergency procedures and precautions and reviewed drive line exit site care.  Aundria Rud 01/19/2013, 7:13 AM Length of Stay: 8   Patient seen and examined with Ulla Potash, NP. We discussed all aspects of the encounter. I agree with the assessment and plan as stated above. He looks great. Agree with switching lasix to daily. KUB with high stool burden - he has lactulose written. Tolerating b-blocker well. Continue current regimen. Continue education and ambulation.   Cassidie Veiga,MD 10:43 AM

## 2013-01-19 NOTE — Progress Notes (Signed)
Physical Therapy Treatment Patient Details Name: [name] MRN: [id number] DOB: 02/23/1941 Today's Date: 01/19/2013 Time: 0950-1020 PT Time Calculation (min): 30 min  PT Assessment / Plan / Recommendation Comments on Treatment Session  Pt s/p LVAD.  Pt limited by DOE today.  Desat to 82 % on RA.  Karie Mainland, NP came in and she is aware that pt may need O2 for ambulation at home.  Also Karie Mainland to get the rollator ordered so pt can practice with it.    Pt able to place himself on batteries and then back to main terminal with min A and verbal cues.  Still gets confused at times with sequence of hooking up cords as well as with the connection of the cords.      Follow Up Recommendations  Home health PT;Supervision/Assistance - 24 hour                 Equipment Recommendations  Other (comment) (Rollator)        Frequency Min 5X/week   Plan Discharge plan remains appropriate;Frequency remains appropriate    Precautions / Restrictions Precautions Precautions: Sternal Precaution Comments: LVAD Required Braces or Orthoses: Other Brace/Splint Other Brace/Splint: LVAD black bag at all times Restrictions-Sternal   Pertinent Vitals/Pain Desat to 82% on RA with ambulation.  O2 to 91 % once back in room at rest.  No pain    Mobility  Bed Mobility Bed Mobility: Supine to Sit Supine to Sit: 4: Min assist Sitting - Scoot to Edge of Bed: 4: Min assist Sit to Supine: Not Tested (comment) Details for Bed Mobility Assistance: Assist to bring trunk up in order to maintain sternal precautions. Transfers Transfers: Sit to Stand;Stand to Sit Sit to Stand: 4: Min assist;Without upper extremity assist;From chair/3-in-1;From bed Stand to Sit: 4: Min assist;Without upper extremity assist;To chair/3-in-1;To bed Details for Transfer Assistance: Verbal cues for sternal precautions.  Assist to bring hips up and to control descent.Needed cues to place hands on knees.   Ambulation/Gait Ambulation/Gait  Assistance: 4: Min guard Ambulation Distance (Feet): 175 Feet Assistive device: Rolling walker Pt ambulates with RW needing cues to stay close to RW.  Pt at times with unsteady gait needing min guard assist and cues for sequencing steps and RW.   Gait Pattern: Step-through pattern;Decreased stride length;Trunk flexed Gait velocity: decreased Stairs: No Wheelchair Mobility Wheelchair Mobility: No    PT Goals Acute Rehab PT Goals PT Goal: Supine/Side to Sit - Progress: Progressing toward goal PT Goal: Sit to Stand - Progress: Progressing toward goal PT Goal: Stand to Sit - Progress: Progressing toward goal PT Goal: Ambulate - Progress: Progressing toward goal  Visit Information  Last PT Received On: 01/19/13 Assistance Needed: +2 (for follow with chair as pt gets DOE)    Subjective Data  Subjective: "I feel like I just can't get my breath."   Cognition  Cognition Arousal/Alertness: Awake/alert Behavior During Therapy: WFL for tasks assessed/performed Overall Cognitive Status: Within Functional Limits for tasks assessed    Balance  Balance Balance Assessed: Yes Static Sitting Balance Static Sitting - Balance Support: Feet supported Static Sitting - Level of Assistance: 5: Stand by assistance Static Sitting - Comment/# of Minutes: 10 minutes - Pt needed only min cues to switch self to battery and back.  Cues needed to remember the black bag with back up batteries. Static Standing Balance Static Standing - Balance Support: Bilateral upper extremity supported Static Standing - Level of Assistance: 5: Stand by assistance Static Standing - Comment/# of  Minutes: 5 minutes to urinate in commode.  End of Session PT - End of Session Activity Tolerance: Patient tolerated treatment well Patient left: with call bell/phone within reach;in chair;with family/visitor present Nurse Communication: Mobility status       INGOLD,Analys Ryden 01/19/2013, 10:58 AM [facility]el Acute  Rehabilitation [phone] (pager)

## 2013-01-19 NOTE — Progress Notes (Signed)
Occupational Therapy Treatment Patient Details Name: [name] MRN: [id number] DOB: 11-Aug-1941 Today's Date: 01/19/2013 Time: 4098-1191 OT Time Calculation (min): 42 min  OT Assessment / Plan / Recommendation Comments on Treatment Session Pt making steady progress. Focus of session this pm on switching power systems. Pt confuses cords at times. Left written instructions on wall so pt can follow consistent pattern to reinforce learning and to increase staff carry over.  Unsafe use of RW - pt lifting it up over chair. ? Insight/awareness. Pt did however, remember how to run through battery check this pm after being taught this am.     Follow Up Recommendations  Home health OT;Supervision/Assistance - 24 hour    Barriers to Discharge   none    Equipment Recommendations  3 in 1 bedside comode    Recommendations for Other Services    Frequency Min 3X/week   Plan Discharge plan remains appropriate    Precautions / Restrictions Precautions Precautions: Sternal Precaution Comments: LVAD Required Braces or Orthoses: Other Brace/Splint Other Brace/Splint: LVAD black bag at all times Restrictions Weight Bearing Restrictions: Yes RUE Weight Bearing: Non weight bearing LUE Weight Bearing: Non weight bearing   Pertinent Vitals/Pain Vitals stable. O2 SATS >94 during activity    ADL  ADL Comments: focus of session on switching out to battery pack system and donning/doffing vest. Pt went through process several times. Used teach back method to reinforce sequence. Also posted written instructions on wall for pt/staff to follow so that pt will complete same sequence repetitively to reinforce learning. Pt ambulated to recliner @ RW level with minguard. Unsafe RW use. vc to use correctly and not pick up over furniture.    OT Diagnosis:    OT Problem List:   OT Treatment Interventions:     OT Goals Acute Rehab OT Goals OT Goal Formulation: With patient Time For Goal Achievement:  01/28/13 Potential to Achieve Goals: Good ADL Goals Pt Will Perform Grooming: with supervision;Standing at sink ADL Goal: Grooming - Progress: Progressing toward goals Pt Will Perform Upper Body Bathing: with set-up;Sitting, edge of bed;Sitting, chair;Unsupported ADL Goal: Upper Body Bathing - Progress: Progressing toward goals Pt Will Perform Lower Body Bathing: with set-up;Sit to stand from bed;Sit to stand from chair;Unsupported;with adaptive equipment ADL Goal: Lower Body Bathing - Progress: Progressing toward goals Pt Will Perform Upper Body Dressing: with set-up;Sitting, chair;Sitting, bed;Unsupported ADL Goal: Upper Body Dressing - Progress: Progressing toward goals Pt Will Perform Lower Body Dressing: with set-up;Sit to stand from chair;Sit to stand from bed;Unsupported;with adaptive equipment ADL Goal: Lower Body Dressing - Progress: Progressing toward goals Pt Will Transfer to Toilet: with supervision;Ambulation;with DME;Comfort height toilet ADL Goal: Toilet Transfer - Progress: Progressing toward goals Pt Will Perform Toileting - Clothing Manipulation: with set-up;Standing ADL Goal: Toileting - Clothing Manipulation - Progress: Progressing toward goals Pt Will Perform Toileting - Hygiene: with set-up;Sit to stand from 3-in-1/toilet ADL Goal: Toileting - Hygiene - Progress: Progressing toward goals Miscellaneous OT Goals Miscellaneous OT Goal #1: Pt will perform bed mobility with supervision as precursor for EOB ADLs. OT Goal: Miscellaneous Goal #1 - Progress: Progressing toward goals Miscellaneous OT Goal #2: Pt will independently manipulate all LVAD equipment in preparation for functional mobility.  OT Goal: Miscellaneous Goal #2 - Progress: Progressing toward goals  Visit Information  Last OT Received On: 01/19/13 Assistance Needed: +1    Subjective Data      Prior Functioning       Cognition  Cognition Arousal/Alertness: Awake/alert Behavior During  Therapy: WFL  for tasks assessed/performed Overall Cognitive Status:  (concern over STM deficits? )    Mobility  Bed Mobility Bed Mobility: Supine to Sit Supine to Sit: 4: Min assist Details for Bed Mobility Assistance: Assist to bring trunk up in order to maintain sternal precautions. Transfers Transfers: Sit to Stand;Stand to Sit Sit to Stand: 4: Min guard Stand to Sit: 4: Min guard    Exercises      Balance  minguard   End of Session OT - End of Session Equipment Utilized During Treatment: Other (comment) (LVAD equipment) Activity Tolerance: Patient tolerated treatment well Patient left: in chair;with call bell/phone within reach Nurse Communication: Mobility status;Other (comment) (need to follow sequence for switching out power systmes)  GO     [name],[name] 01/19/2013, 5:04 PM [facility], OTR/L  [phone] 01/19/2013

## 2013-01-19 NOTE — Progress Notes (Signed)
Pt still refusing CPAP at this time. RT will continue to monitor. 

## 2013-01-20 ENCOUNTER — Inpatient Hospital Stay (HOSPITAL_COMMUNITY): Payer: Medicare Other

## 2013-01-20 DIAGNOSIS — Z95818 Presence of other cardiac implants and grafts: Secondary | ICD-10-CM

## 2013-01-20 DIAGNOSIS — I5023 Acute on chronic systolic (congestive) heart failure: Secondary | ICD-10-CM

## 2013-01-20 LAB — GLUCOSE, CAPILLARY
Glucose-Capillary: 114 mg/dL — ABNORMAL HIGH (ref 70–99)
Glucose-Capillary: 115 mg/dL — ABNORMAL HIGH (ref 70–99)
Glucose-Capillary: 119 mg/dL — ABNORMAL HIGH (ref 70–99)
Glucose-Capillary: 133 mg/dL — ABNORMAL HIGH (ref 70–99)
Glucose-Capillary: 160 mg/dL — ABNORMAL HIGH (ref 70–99)

## 2013-01-20 LAB — COMPREHENSIVE METABOLIC PANEL
ALT: 18 U/L (ref 0–53)
AST: 33 U/L (ref 0–37)
Albumin: 3.1 g/dL — ABNORMAL LOW (ref 3.5–5.2)
Alkaline Phosphatase: 48 U/L (ref 39–117)
BUN: 30 mg/dL — ABNORMAL HIGH (ref 6–23)
CO2: 25 mEq/L (ref 19–32)
Calcium: 10 mg/dL (ref 8.4–10.5)
Chloride: 98 mEq/L (ref 96–112)
Creatinine, Ser: 0.82 mg/dL (ref 0.50–1.35)
GFR calc Af Amer: >90 mL/min (ref >90)
GFR calc non Af Amer: 87 mL/min — ABNORMAL LOW (ref >90)
Glucose, Bld: 142 mg/dL — ABNORMAL HIGH (ref 70–99)
Potassium: 4.4 mEq/L (ref 3.5–5.1)
Sodium: 132 mEq/L — ABNORMAL LOW (ref 135–145)
Total Bilirubin: 0.7 mg/dL (ref 0.3–1.2)
Total Protein: 6.5 g/dL (ref 6.0–8.3)

## 2013-01-20 LAB — PROTIME-INR
INR: 2.3 — ABNORMAL HIGH (ref 0.00–1.49)
Prothrombin Time: 24.3 seconds — ABNORMAL HIGH (ref 11.6–15.2)

## 2013-01-20 LAB — CBC
HCT: 26.8 % — ABNORMAL LOW (ref 39.0–52.0)
Hemoglobin: 8.3 g/dL — ABNORMAL LOW (ref 13.0–17.0)
MCH: 26.1 pg (ref 26.0–34.0)
MCHC: 31 g/dL (ref 30.0–36.0)
MCV: 84.3 fL (ref 78.0–100.0)
Platelets: 209 10*3/uL (ref 150–400)
RBC: 3.18 MIL/uL — ABNORMAL LOW (ref 4.22–5.81)
RDW: 18.2 % — ABNORMAL HIGH (ref 11.5–15.5)
WBC: 9.8 10*3/uL (ref 4.0–10.5)

## 2013-01-20 LAB — CARBOXYHEMOGLOBIN: Carboxyhemoglobin: 2.3 % — ABNORMAL HIGH (ref 0.5–1.5)

## 2013-01-20 LAB — LACTATE DEHYDROGENASE: LDH: 251 U/L — ABNORMAL HIGH (ref 94–250)

## 2013-01-20 MED ORDER — METOPROLOL SUCCINATE ER 25 MG PO TB24
25.0000 mg | ORAL_TABLET | Freq: Every day | ORAL | Status: DC
  Administered 2013-01-20 – 2013-01-22 (×3): 25 mg via ORAL
  Filled 2013-01-20 (×3): qty 1

## 2013-01-20 MED ORDER — INSULIN ASPART 100 UNIT/ML ~~LOC~~ SOLN
0.0000 [IU] | Freq: Three times a day (TID) | SUBCUTANEOUS | Status: DC
  Administered 2013-01-20: 2 [IU] via SUBCUTANEOUS

## 2013-01-20 MED ORDER — WARFARIN SODIUM 5 MG PO TABS
5.0000 mg | ORAL_TABLET | Freq: Once | ORAL | Status: AC
  Administered 2013-01-20: 5 mg via ORAL
  Filled 2013-01-20: qty 1

## 2013-01-20 MED ORDER — CITALOPRAM HYDROBROMIDE 40 MG PO TABS
40.0000 mg | ORAL_TABLET | Freq: Every day | ORAL | Status: DC
  Administered 2013-01-21 – 2013-01-22 (×2): 40 mg via ORAL
  Filled 2013-01-20 (×2): qty 1

## 2013-01-20 MED ORDER — ALTEPLASE 2 MG IJ SOLR
2.0000 mg | Freq: Once | INTRAMUSCULAR | Status: AC
  Administered 2013-01-20: 2 mg
  Filled 2013-01-20: qty 2

## 2013-01-20 NOTE — Progress Notes (Signed)
HeartMate 2 Rounding Note postop day 8 status post redo sternotomy and HeartMate 2 implantation  Subjective:    [name] is a 72 year old gentleman with a history of CABG (2003), Chronic systolic HF EF 20-25% %, S/P AICD (generator change 2011), hyperlipidemia, hypothyroidism, emphysema, OSA, intermittent atrial fibrillation (on coumadin). Quit smoking 2003. Uses CPAP and O2 every night. He is a disabled [employer]. He was referred to the AHF clinic by Dr. Elease Hashimoto and over the past couple of months his functional capacity has really declined. He has class IV heart failure symptoms and has not improved on OMM. His CPX in 11/13 showed a VO2 of 9.2%, VE/VCO2- 52.8, OUES 1.15 and RER= 1.02. We have been following him closely and have had multiple discussions about his options and the patient decided that he wanted to proceed with LVAD placement. I discussed the patient with Dr Romona Curls at [facility] who agreed that the patient was an acceptable DT VAD candidate- his pulm hx of asbestosis, low DLCO are a problem for transplant candidacy  Admitted 01/10/13 for optimization before LVAD implant with acute on chronic heart failure.. EF by LV gram 20% 01/07/13  POD#8 Walked> 300 ft today- nausea resolved, needs O2 w/ ambulation Inotropes all weaned off-co-ox this a.m. 62%  Controlled afib Wt down 1 lbs now on po lasix Hemoglobin up to 8.2 on oral iron and diuresis   Minimal PI events, , continue LVAD speed  at 9200 RPM Pump pocket drain removed Nausea resolved, KUB reviewed Successful teaching completed about driveline and equipment  LVAD INTERROGATION:  HeartMate II LVAD:  Flow 5.45liters/min, speed 9200, power 5.5, PI 4.6  No PI events  Objective:    Vital Signs:   Temp:  [97.7 F (36.5 C)-98.8 F (37.1 C)] 98.8 F (37.1 C) (04/23 1444) Pulse Rate:  [86-98] 94 (04/23 1444) Resp:  [18] 18 (04/23 1444) SpO2:  [92 %-94 %] 92 % (04/23 1444) Weight:  [222 lb 0.1 oz (100.7 kg)] 222 lb 0.1 oz (100.7 kg)  (04/23 0536) Last BM Date: 01/19/13 Mean arterial Pressure 70-80's  Intake/Output:   Intake/Output Summary (Last 24 hours) at 01/20/13 1842 Last data filed at 01/20/13 1630  Gross per 24 hour  Intake    720 ml  Output      0 ml  Net    720 ml     Physical Exam: General: NAD, Laying in bed HEENT: normal Neck: supple. CVP 8-9 . Carotids -  no bruits. No lymphadenopathy or thryomegaly appreciated. Cor: Mechanical heart sounds with LVAD hum present.-  Lungs: diminished in the bases Abdomen: soft, nontender, no distention. No hepatosplenomegaly. No bruits or masses. no bowel sounds. Driveline: Dressing C/D/I and securement device intact Extremities: no cyanosis, clubbing, rash, edema extremities warm and well perfused Neuro: alert & orientedx3, cranial nerves grossly intact. moves all 4 extremities w/o difficulty Sternal incision clean and dry   Telemetry: Afib 95 bpm  Labs: Basic Metabolic Panel:  Recent Labs Lab 01/14/13 0400  01/15/13 0400 01/16/13 0355 01/17/13 0341 01/18/13 0330 01/19/13 0515 01/20/13 0445  NA 129*  < > 129* 133* 130* 131* 132* 132*  K 3.7  < > 4.2 3.6 3.8 3.8 4.4 4.4  CL 96  < > 95* 98 97 95* 97 98  CO2 28  --  31 29 30 29 27 25   GLUCOSE 146*  < > 119* 115* 82 104* 99 142*  BUN 10  < > 17 27* 24* 19 25* 30*  CREATININE 0.63  < >  0.67 0.72 0.68 0.69 0.75 0.82  CALCIUM 9.2  --  9.3 9.2 9.2 9.7 10.0 10.0  MG 1.9  --  2.0 2.0 2.1 2.1  --   --   PHOS 2.1*  --  2.4  --   --   --   --   --   < > = values in this interval not displayed.  Liver Function Tests:  Recent Labs Lab 01/15/13 0830 01/17/13 0341 01/20/13 0445  AST 30 23 33  ALT 11 11 18   ALKPHOS 42 43 48  BILITOT 0.7 0.5 0.7  PROT 5.8* 5.9* 6.5  ALBUMIN 2.8* 2.7* 3.1*    Recent Labs Lab 01/15/13 0830  AMYLASE 16   No results found for this basename: AMMONIA,  in the last 168 hours  CBC:  Recent Labs Lab 01/14/13 0400  01/15/13 0400 01/15/13 1500 01/16/13 0355  01/17/13 0341 01/18/13 0330 01/20/13 0445  WBC 12.4*  --  9.8 8.6 8.8 8.4 7.7 9.8  NEUTROABS 10.2*  --  8.1*  --  6.8 6.0 5.4  --   HGB 7.4*  < > 8.2* 7.8* 7.8* 8.0* 8.4* 8.3*  HCT 22.6*  < > 25.0* 23.8* 24.7* 25.4* 25.9* 26.8*  MCV 79.3  --  81.2 81.5 81.8 82.2 82.0 84.3  PLT 100*  --  97* 92* 109* 111* 148* 209  < > = values in this interval not displayed.  INR:  Recent Labs Lab 01/16/13 0355 01/17/13 0341 01/18/13 0330 01/19/13 0515 01/20/13 0445  INR 1.23 1.43 1.83* 2.33* 2.30*    Other results:  EKG:   Imaging: Dg Chest 2 View  01/20/2013  *RADIOLOGY REPORT*  Clinical Data: Ventricular assist device  CHEST - 2 VIEW  Comparison: 01/18/2013  Findings: Left ventricular assist device noted.  Stable enlarged heart silhouette.  There is bibasilar atelectasis unchanged from prior.  Small effusions present.  No pulmonary edema.  The right PICC line noted.  IMPRESSION: 1.  No interval change. 2.  Bibasilar atelectasis and small effusions.   Original Report Authenticated By: Genevive Bi, M.D.      Medications:     Scheduled Medications: . aspirin EC  325 mg Oral Daily  . bisacodyl  10 mg Oral Daily  . budesonide-formoterol  2 puff Inhalation BID  . [START ON 01/21/2013] citalopram  40 mg Oral Daily  . docusate sodium  200 mg Oral Daily  . feeding supplement  237 mL Oral BID BM  . ferrous fumarate-b12-vitamic C-folic acid  1 capsule Oral TID PC  . furosemide  40 mg Oral Daily  . hydrocortisone   Rectal TID  . insulin aspart  0-24 Units Subcutaneous TID AC & HS  . insulin glargine  12 Units Subcutaneous QHS  . levothyroxine  50 mcg Oral QHS  . metoCLOPramide  10 mg Oral TID AC  . metoprolol succinate  25 mg Oral Daily  . pantoprazole  40 mg Oral Daily  . potassium chloride  20 mEq Oral BID  . tiotropium  18 mcg Inhalation Daily  . Warfarin - Pharmacist Dosing Inpatient   Does not apply q1800    Infusions:    PRN Medications: acetaminophen, oxyCODONE, sodium  chloride, traMADol   Assessment:   1) Acute on chronic HF - class IV  2) Chronic AF on coumadin  3) ICM, EF 20%        S/p LVAD Heartmate II implant 01/12/13 4) CAD  S/p CABG 2003  5) COPD with depressed DLCO  CPAP at night  6) HLD  7) Hypothyroidism  8) Expected blood loss anemia- LDH transiently increased prob due to banked blood breakdown but will follow for evidence of hemolysis. Continue oral iron and folate  Plan/Discussion:    POD #8  Plan Anticoagulation reached goal 2-2.5 Much stronger now walking tol beta blocker for afib controll Will need echo ramp study to set speed for DC home Will get CT chest w/o contrast prior to DC home Length of Stay: 10   I reviewed the LVAD parameters from today, and compared the results to the patient's prior recorded data. No programming changes were made. The LVAD is functioning within specified parameters. Nursing performing LVAD self-test daily. LVAD interrogation was negative for any significant power changes, alarms, or PI events or speed drops. LVAD equipment check completed and is in good working order. Backup equipment present. LVAD education done on emergency procedures and precautions and reviewed drive line exit site care.  VAN TRIGT III,PETER 01/20/2013, 6:42 PM

## 2013-01-20 NOTE — Progress Notes (Signed)
HeartMate 2 Rounding Note  Subjective:    [name] is a 72 year old gentleman with a history of CABG (2003), Chronic systolic HF EF 20-25% %, S/P AICD (generator change 2011), hyperlipidemia, hypothyroidism, emphysema, OSA, intermittent atrial fibrillation (on coumadin). Quit smoking 2003. Uses CPAP and O2 every night. He is a disabled [employer]. He was referred to the AHF clinic by Dr. Elease Hashimoto and over the past couple of months his functional capacity has really declined. He has class IV heart failure symptoms and has not improved on OMM. His CPX in 11/13 showed a VO2 of 9.2%, VE/VCO2- 52.8, OUES 1.15 and RER= 1.02. We have been following him closely and have had multiple discussions about his options and the patient decided that he wanted to proceed with LVAD placement. I discussed the patient with Dr Romona Curls at [facility] who agreed that the patient was an acceptable DT VAD candidate- his pulm hx of asbestosis, low DLCO are a problem for transplant candidacy  Admitted 01/10/13 for optimization before LVAD implant with acute on chronic heart failure.. EF by LV gram 20% 01/07/13  POD#8  Doing well and ambulating more in the halls with minimal SOB. OT working with patient on not confusing cords and put instructions on the wall for him. Had several BMs yesterday with sorbitol. Nausea improved. Denies pain, orthopnea, or edema. MAPs 60-80s  Co-ox 67 INR 2.30 LDH 251  LVAD INTERROGATION:  HeartMate II LVAD:  Flow 6.2 liters/min, speed 9200, power 5.8, PI 4.86  One PI event  Objective:    Vital Signs:   Temp:  [97.7 F (36.5 C)-98.4 F (36.9 C)] 98.4 F (36.9 C) (04/23 0536) Pulse Rate:  [86-98] 86 (04/23 0536) Resp:  [18] 18 (04/23 0536) SpO2:  [82 %-94 %] 94 % (04/23 0536) Last BM Date: 01/19/13 Mean arterial Pressure 80's  Intake/Output:   Intake/Output Summary (Last 24 hours) at 01/20/13 0717 Last data filed at 01/19/13 1300  Gross per 24 hour  Intake    480 ml  Output      0 ml  Net     480 ml     Physical Exam: General: NAD, Sitting in chair eating breakfast HEENT: normal Neck: supple. JVD flat . Carotids -  no bruits. No lymphadenopathy or thryomegaly appreciated. Cor: Mechanical heart sounds with LVAD hum present Lungs: CTA. Abdomen: soft, nontender, distention. No hepatosplenomegaly. No bruits or masses. + bowel sounds. Driveline: Dressing C/D/I and securement device intact Extremities: no cyanosis, clubbing, rash, edema extremities warm and well perfused Neuro: alert & orientedx3, cranial nerves grossly intact. moves all 4 extremities w/o difficulty  Telemetry: Afib 89 bpm  Labs: Basic Metabolic Panel:  Recent Labs Lab 01/14/13 0400  01/15/13 0400 01/16/13 0355 01/17/13 0341 01/18/13 0330 01/19/13 0515 01/20/13 0445  NA 129*  < > 129* 133* 130* 131* 132* 132*  K 3.7  < > 4.2 3.6 3.8 3.8 4.4 4.4  CL 96  < > 95* 98 97 95* 97 98  CO2 28  --  31 29 30 29 27 25   GLUCOSE 146*  < > 119* 115* 82 104* 99 142*  BUN 10  < > 17 27* 24* 19 25* 30*  CREATININE 0.63  < > 0.67 0.72 0.68 0.69 0.75 0.82  CALCIUM 9.2  --  9.3 9.2 9.2 9.7 10.0 10.0  MG 1.9  --  2.0 2.0 2.1 2.1  --   --   PHOS 2.1*  --  2.4  --   --   --   --   --   < > =  values in this interval not displayed.  Liver Function Tests:  Recent Labs Lab 01/15/13 0830 01/17/13 0341 01/20/13 0445  AST 30 23 33  ALT 11 11 18   ALKPHOS 42 43 48  BILITOT 0.7 0.5 0.7  PROT 5.8* 5.9* 6.5  ALBUMIN 2.8* 2.7* 3.1*    Recent Labs Lab 01/15/13 0830  AMYLASE 16   No results found for this basename: AMMONIA,  in the last 168 hours  CBC:  Recent Labs Lab 01/14/13 0400  01/15/13 0400 01/15/13 1500 01/16/13 0355 01/17/13 0341 01/18/13 0330 01/20/13 0445  WBC 12.4*  --  9.8 8.6 8.8 8.4 7.7 9.8  NEUTROABS 10.2*  --  8.1*  --  6.8 6.0 5.4  --   HGB 7.4*  < > 8.2* 7.8* 7.8* 8.0* 8.4* 8.3*  HCT 22.6*  < > 25.0* 23.8* 24.7* 25.4* 25.9* 26.8*  MCV 79.3  --  81.2 81.5 81.8 82.2 82.0 84.3  PLT 100*   --  97* 92* 109* 111* 148* 209  < > = values in this interval not displayed.  INR:  Recent Labs Lab 01/16/13 0355 01/17/13 0341 01/18/13 0330 01/19/13 0515 01/20/13 0445  INR 1.23 1.43 1.83* 2.33* 2.30*    Other results:     Imaging: Dg Abd Portable 1v  01/18/2013  *RADIOLOGY REPORT*  Clinical Data: Nausea.  Status post left ventricular assist device placement.  PORTABLE ABDOMEN - 1 VIEW  Comparison: Chest film of earlier in the day.  Findings: 2 supine portable views.  Left ventricular assist device again identified.  Cardiomegaly.  Pleural-based calcifications again identified bilaterally.  Gas filled bowel loop in the lower abdomen is favored to be the sigmoid colon, normal size.  Otherwise, paucity of bowel gas. There is a moderate amount of stool throughout the transverse and splenic flexure of the colon.  No gross free intraperitoneal air. No pneumatosis.  IMPRESSION: Paucity of abdominal bowel gas, without evidence of free intraperitoneal air or bowel obstruction.  Pleural-based calcifications, again suggesting asbestos related pleural disease.  Moderate amount of stool within the colon.  Question constipation.   Original Report Authenticated By: Jeronimo Greaves, M.D.      Medications:     Scheduled Medications: . aspirin EC  325 mg Oral Daily  . bisacodyl  10 mg Oral Daily  . budesonide-formoterol  2 puff Inhalation BID  . citalopram  20 mg Oral Daily  . docusate sodium  200 mg Oral Daily  . feeding supplement  237 mL Oral BID BM  . ferrous fumarate-b12-vitamic C-folic acid  1 capsule Oral TID PC  . furosemide  40 mg Oral Daily  . hydrocortisone   Rectal TID  . insulin aspart  0-24 Units Subcutaneous TID AC & HS  . insulin glargine  12 Units Subcutaneous QHS  . levothyroxine  50 mcg Oral QHS  . metoCLOPramide  10 mg Oral TID AC  . metoprolol tartrate  12.5 mg Oral BID  . pantoprazole  40 mg Oral Daily  . potassium chloride  20 mEq Oral BID  . tiotropium  18 mcg  Inhalation Daily  . Warfarin - Pharmacist Dosing Inpatient   Does not apply q1800    Infusions:    PRN Medications: acetaminophen, oxyCODONE, sodium chloride, traMADol   Assessment:   1) Acute on chronic HF - class IV  2) Chronic AF on coumadin  3) ICM, EF 20%        S/p LVAD Heartmate II implant 01/12/13 4) CAD  S/p CABG 2003  5) COPD with depressed DLCO  CPAP at night  6) HLD  7) Hypothyroidism  8) Expected blood loss anemia- LDH transiently increased prob due to banked blood breakdown but will follow for evidence of hemolysis. Continue oral iron and folate  Plan/Discussion:    POD #8  Stable overnight. Awaiting weight, but appear at baseline will leave PO diuretics. Can d/c daily Co-ox's. Continue to ambulate TID in the halls. Patient will need RAMP ECHO before discharge this weekend, will see if we can schedule in next couple of days.   Will return today to provide more education to patient and wife.   I reviewed the LVAD parameters from today, and compared the results to the patient's prior recorded data. No programming changes were made. The LVAD is functioning within specified parameters. Nursing/patient performing LVAD self-test daily. LVAD interrogation was negative for any significant power changes, alarms, or PI events or speed drops. LVAD equipment check completed and is in good working order. Backup equipment present. LVAD education done on emergency procedures and precautions and reviewed drive line exit site care.  Ulla Potash B 01/20/2013, 7:17 AM Length of Stay: 8 7:17 AM  Patient seen with NP, agree with the above note.  Stable today, home later in week.  Transition to Toprol XL 25 mg daily.  Otherwise no changes.   Marca Ancona 01/20/2013 8:10 AM

## 2013-01-20 NOTE — Progress Notes (Signed)
ANTICOAGULATION CONSULT NOTE   Pharmacy Consult for warfarin  Indication: atrial fibrillation and LVAD placed 4/15  No Known Allergies  Patient Measurements: Height: 6' (182.9 cm) Weight: 222 lb 0.1 oz (100.7 kg) IBW/kg (Calculated) : 77.6   Recent Labs  01/18/13 0330 01/18/13 0500 01/19/13 0515 01/20/13 0445  HGB 8.4*  --   --  8.3*  HCT 25.9*  --   --  26.8*  PLT 148*  --   --  209  APTT >200* 64*  --   --   LABPROT 20.5*  --  24.5* 24.3*  INR 1.83*  --  2.33* 2.30*  CREATININE 0.69  --  0.75 0.82    Estimated Creatinine Clearance: 101.4 ml/min (by C-G formula based on Cr of 0.82).  Medications:  PTA Warfarin 5mg  daily except 2.5mg  on Thursday  Assessment: 72 year old male now POD#8 from LVAD. Patient was on coumadin prior to admission for afib and was on heparin bridge prior to surgery. INR therapeutic today..  Hgb stable, platelets improving. No bleeding noted in chart except for complaints of bleeding hemorrhoids.  Hgb low but appears stable, platelet count rising.  Goal of Therapy:  INR 2-2.5 per Dr. Zenaida Niece Trigt's note Monitor platelets by anticoagulation protocol: Yes   Plan:  1) Repeat warfarin 5mg  x 1 tonight (consistent with home dose) 2) F/u daily INR 3) Continue to monitor signs/symptoms of bleeding  Tad Moore, BCPS  Clinical Pharmacist Pager [phone]  01/20/2013 11:28 AM

## 2013-01-20 NOTE — Progress Notes (Signed)
Progress Note from the Palliative Medicine Team at [facility]  Subjective: pateint is alert and oriented, sitting up on side of bed, reports "I'm doing good"     Objective: No Known Allergies Scheduled Meds: . alteplase  2 mg Intracatheter Once  . aspirin EC  325 mg Oral Daily  . bisacodyl  10 mg Oral Daily  . budesonide-formoterol  2 puff Inhalation BID  . citalopram  20 mg Oral Daily  . docusate sodium  200 mg Oral Daily  . feeding supplement  237 mL Oral BID BM  . ferrous fumarate-b12-vitamic C-folic acid  1 capsule Oral TID PC  . furosemide  40 mg Oral Daily  . hydrocortisone   Rectal TID  . insulin aspart  0-24 Units Subcutaneous TID AC & HS  . insulin glargine  12 Units Subcutaneous QHS  . levothyroxine  50 mcg Oral QHS  . metoCLOPramide  10 mg Oral TID AC  . metoprolol succinate  25 mg Oral Daily  . pantoprazole  40 mg Oral Daily  . potassium chloride  20 mEq Oral BID  . tiotropium  18 mcg Inhalation Daily  . Warfarin - Pharmacist Dosing Inpatient   Does not apply q1800   Continuous Infusions:  PRN Meds:.acetaminophen, oxyCODONE, sodium chloride, traMADol  BP 81/68  Pulse 86  Temp(Src) 98.4 F (36.9 C) (Oral)  Resp 18  Ht 6' (1.829 m)  Wt 100.7 kg (222 lb 0.1 oz)  BMI 30.1 kg/m2  SpO2 94%   PPS:40 %  Pain Score: reports intermittent "sharp" pain in left side--reported to Ulla Potash NP with LVAD team    Intake/Output Summary (Last 24 hours) at 01/20/13 0959 Last data filed at 01/19/13 1300  Gross per 24 hour  Intake    240 ml  Output      0 ml  Net    240 ml       Physical Exam:  General: Sitting on side of bed, NAD HEENT:  Mm, no exudate Chest:   CTA--encouraged deep breathing CVS: LVAD hum noted Abdomen: soft NT +NS Ext: without edema Neuro:alert and oriented Psych: denies depression, "Feels good"  verbalizes positive thoughts and feelings  Labs: CBC    Component Value Date/Time   WBC 9.8 01/20/2013 0445   RBC 3.18* 01/20/2013 0445   HGB  8.3* 01/20/2013 0445   HCT 26.8* 01/20/2013 0445   PLT 209 01/20/2013 0445   MCV 84.3 01/20/2013 0445   MCH 26.1 01/20/2013 0445   MCHC 31.0 01/20/2013 0445   RDW 18.2* 01/20/2013 0445   LYMPHSABS 1.0 01/18/2013 0330   MONOABS 1.0 01/18/2013 0330   EOSABS 0.2 01/18/2013 0330   BASOSABS 0.0 01/18/2013 0330    BMET    Component Value Date/Time   NA 132* 01/20/2013 0445   K 4.4 01/20/2013 0445   CL 98 01/20/2013 0445   CO2 25 01/20/2013 0445   GLUCOSE 142* 01/20/2013 0445   BUN 30* 01/20/2013 0445   CREATININE 0.82 01/20/2013 0445   CALCIUM 10.0 01/20/2013 0445   GFRNONAA 87* 01/20/2013 0445   GFRAA >90 01/20/2013 0445    CMP     Component Value Date/Time   NA 132* 01/20/2013 0445   K 4.4 01/20/2013 0445   CL 98 01/20/2013 0445   CO2 25 01/20/2013 0445   GLUCOSE 142* 01/20/2013 0445   BUN 30* 01/20/2013 0445   CREATININE 0.82 01/20/2013 0445   CALCIUM 10.0 01/20/2013 0445   PROT 6.5 01/20/2013 0445   ALBUMIN 3.1*  01/20/2013 0445   AST 33 01/20/2013 0445   ALT 18 01/20/2013 0445   ALKPHOS 48 01/20/2013 0445   BILITOT 0.7 01/20/2013 0445   GFRNONAA 87* 01/20/2013 0445   GFRAA >90 01/20/2013 0445      Assessment and Plan: 1. Code Status: Partial 2. Symptom Control:        Weakness/Fatigue: continued participation in PT, encouraged self AROM exercises  3. Psycho/Social: emotional support and positive encouragement offered 4. Spiritual  [employer] support 5. Disposition: Home when stable   Lorinda Creed NP  Palliative Medicine Team Team Phone # [phone] Pager [phone]   1

## 2013-01-20 NOTE — Progress Notes (Signed)
PT said he will not wear a CPAP at all

## 2013-01-20 NOTE — Progress Notes (Signed)
Physical Therapy Treatment Patient Details Name: [name] MRN: [id number] DOB: 1941/02/11 Today's Date: 01/20/2013 Time: 6213-0865 PT Time Calculation (min): 11 min  PT Assessment / Plan / Recommendation Comments on Treatment Session  Patient performed self test and conversion to Battery power. Patient required minimal cues and prompting today. Patient complains off sharp focal pain " grabbing, shock like" over the lower left chest region. Called LVAD coordinator/NP Karie Mainland to inform her of patients complaint. Patient states this pain started today and has occured multiple times today. Nsg in room when patient conveying this information. Patient headed OTF to x-ray. Will attempt to see again this afternoon for ambulation.       01/20/13 0900  PT Visit Information  Last PT Received On 01/20/13  Assistance Needed +1  PT Time Calculation  PT Start Time 0926  PT Stop Time 0937  PT Time Calculation (min) 11 min  Subjective Data  Subjective "I keep getting this shocking pain on my left side"  Precautions  Precautions Sternal  Precaution Comments LVAD  Required Braces or Orthoses Other Brace/Splint  Other Brace/Splint LVAD black bag at all times  Cognition  Arousal/Alertness Awake/alert  Behavior During Therapy [name] for tasks assessed/performed  Overall Cognitive Status (concern over STM deficits? )  Transfers  Transfers Sit to Stand;Stand to Sit  Sit to Stand 5: Supervision  Stand to Sit 5: Supervision  Static Sitting Balance  Static Sitting - Balance Support Feet supported  Static Sitting - Level of Assistance 5: Stand by assistance  Static Sitting - Comment/# of Minutes 9 minutes seated EOB converting to battery power  PT - Assessment/Plan  Comments on Treatment Session Patient performed self test and conversion to Battery power. Patient required minimal cues and prompting today. Patient complains off sharp focal pain "shock like" over the lower left chest region. Called LVAD  coordinator/NP Karie Mainland to inform her of patients complaint. Patient states this pain started today and has occured multiple times today. Nsg in room when patient conveying this information. Patient headed OTF to x-ray. Will attempt to see again this afternoon for ambulation.

## 2013-01-20 NOTE — Progress Notes (Signed)
Pt refusing to walk for now, stated he will maybe try later (upset, wife just informed him that they just put his dog to sleep).  Explained importance of ambulation, will continue to monitor

## 2013-01-20 NOTE — Progress Notes (Signed)
Physical Therapy Treatment Patient Details Name: [name] MRN: [id number] DOB: [birth year] Today's Date: 01/20/2013 Time: 1400-1440 PT Time Calculation (min): 40 min  PT Assessment / Plan / Recommendation Comments on Treatment Session  Patient demonstrates some instability and decreased activity tolerance with extended ambulation using rw. Patient also required increased time and Cues to assist with transition to battery power. Spoke with spouse and pt at length regarding concerns for LVAD at home. Educated on importance of being on terminal unit when sleeping or napping.  Discussed techniques for energy conservation and management of equipment and device. Will continue to see and progress activity as tolerated.    Follow Up Recommendations  Home health PT;Supervision/Assistance - 24 hour     Does the patient have the potential to tolerate intense rehabilitation     Barriers to Discharge        Equipment Recommendations  Other (comment) (Rollator)    Recommendations for Other Services    Frequency Min 5X/week   Plan Discharge plan remains appropriate;Frequency remains appropriate    Precautions / Restrictions Precautions Precautions: Sternal Precaution Comments: LVAD (mod vc to follow) Required Braces or Orthoses: Other Brace/Splint Other Brace/Splint: LVAD black bag at all times Restrictions Weight Bearing Restrictions: Yes RUE Weight Bearing: Non weight bearing LUE Weight Bearing: Non weight bearing   Pertinent Vitals/Pain No pain at this time    Mobility  Bed Mobility Bed Mobility: Supine to Sit;Sitting - Scoot to Edge of Bed Supine to Sit: 5: Supervision Sitting - Scoot to Edge of Bed: 5: Supervision Sit to Supine: 4: Min guard (vc to follow sternal precautions) Transfers Transfers: Sit to Stand;Stand to Sit Sit to Stand: 5: Supervision Stand to Sit: 5: Supervision Details for Transfer Assistance: Verbal cues for sternal precautions.  Assist to bring hips up  and to control descent.Needed cues to place hands on knees.   Ambulation/Gait Ambulation/Gait Assistance: 4: Min assist Ambulation Distance (Feet): 200 Feet Assistive device: Rolling walker Ambulation/Gait Assistance Details: With O2; multiple rest breaks and some posterior lean with balance checks requiring miin assist to stabilize Gait Pattern: Step-through pattern;Decreased stride length;Trunk flexed Gait velocity: decreased Stairs: No      PT Goals Acute Rehab PT Goals PT Goal Formulation: With patient Time For Goal Achievement: 01/28/13 Potential to Achieve Goals: Good Pt will go Supine/Side to Sit: with modified independence Pt will go Sit to Supine/Side: with modified independence Pt will go Sit to Stand: with modified independence PT Goal: Sit to Stand - Progress: Progressing toward goal Pt will go Stand to Sit: with modified independence PT Goal: Stand to Sit - Progress: Progressing toward goal Pt will Ambulate: >150 feet;with modified independence PT Goal: Ambulate - Progress: Progressing toward goal  Visit Information  Last PT Received On: 01/20/13 Assistance Needed: +1    Subjective Data  Subjective: I got a lot on my mind Patient Stated Goal: to go home   Cognition  Cognition Arousal/Alertness: Awake/alert Behavior During Therapy: Flat affect Overall Cognitive Status: Difficult to assess (appears more depressed) Difficult to assess due to: Hard of hearing/deaf    Balance  Static Sitting Balance Static Sitting - Balance Support: Feet supported Static Sitting - Level of Assistance: 5: Stand by assistance Static Sitting - Comment/# of Minutes: 12 minutes converting to battery power and performing education with teachbacks for processing; some assist and VCs needed Static Standing Balance Static Standing - Balance Support: Bilateral upper extremity supported Static Standing - Level of Assistance: 5: Stand by assistance Static Standing -  Comment/# of Minutes:  rest breaks, vitals monitored. Some SOB noted 2 minutes, 3 minutes  End of Session PT - End of Session Equipment Utilized During Treatment: Oxygen Activity Tolerance: Patient tolerated treatment well Patient left: with call bell/phone within reach;in chair;with family/visitor present Nurse Communication: Mobility status   GP     Fabio Asa 01/20/2013, 4:08 PM Charlotte Crumb, PT DPT  [phone]

## 2013-01-20 NOTE — Discharge Summary (Signed)
Advanced Heart Failure Team  Discharge Summary   Patient ID: [name] MRN: [id number], DOB/AGE: Mar 20, 1941 72 y.o. 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 Continue oral iron and folate  Hospital Course: [name] is a 72 year old gentleman with a history of CABG (2003), Chronic systolic HF EF 20-25% %, S/P AICD (generator change 2011), hyperlipidemia, hypothyroidism, emphysema, OSA, intermittent atrial fibrillation (on coumadin) and depression. He quit smoking in 2003 and uses CPAP and O2 every night. He is a disabled [employer].  He has been followed in the AHF clinic and over the last couple of months his functional capacity has really declined. He failed optimal medical management for 45/60 days and his EF was 20% with class IV heart failure symptoms when he decided that he would like to have a LVAD placed. He was admitted to the hospital on 4/13 for swan ganz catheter placement and optimization before LVAD placement on 01/12/13.  After surgery he was admitted straight to the Surgical ICU and remained on nitric oxide along with a few vasoactive gtts and the ventilator. On POD #1 he was extubated and started on a lasix gtt for volume overload. During his stay his vasoactive gtts were weaned off and his co-ox remained in normal range. A 2L PICC was placed to obtain CVPs and minimize blood sticks.   On POD # 6 the patient was transferred to the step down unit and extensive education was continued with the patient and wife along with  ambulation TID with PT and CR.  A RAMP ECHO was performed on 01/21/13 to assess LVAD function and to optimize speed. The patient's speed was set to 9200 under ECHO and the back up speed was set to 8600. The AV remains closed, No AI, LVIDD 4.97 cm, Trace MR, and the septum was midline at the set speed.   Both the patient and wife have practiced switching a controller to a back up controller using the patient's back-up controller and the mock loop. They have both passed the education verification test and have had all their questions answered. Both Mr and [name] feel safe and comfortable going home with the LVAD and equipment. His ICD has been turned back on and higher thresholds were set. Vitals are stable and the patient is safe for discharge.   LVAD Parameters:  Flow: 5.4   Speed: 9200   PI: 4.6   Power: 5.8    Back-up Speed: 8600           Discharge Weight Range: 222-225 lbs Discharge Vitals: Blood pressure 81/68, pulse 90, temperature 98.2 F (36.8 C), temperature source Oral, resp. rate 18, height 6' (1.829 m), weight 219 lb 2.2 oz (99.4 kg), SpO2 94.00%.  Labs: Lab Results  Component Value Date   WBC 10.6* 01/22/2013   HGB 8.3* 01/22/2013   HCT 27.6* 01/22/2013   MCV 86.0  01/22/2013   PLT 229 01/22/2013     Recent Labs Lab 01/20/13 0445 01/22/13 0545  NA 132* 133*  K 4.4 4.1  CL 98 98  CO2 25 27  BUN 30* 27*  CREATININE 0.82 0.81  CALCIUM 10.0 9.8  PROT 6.5  --   BILITOT 0.7  --   ALKPHOS 48  --   ALT 18  --   AST 33  --   GLUCOSE 142* 98   Lab Results  Component Value Date   CHOL 126 07/02/2012   HDL 40.80 07/02/2012   LDLCALC 58 07/02/2012   TRIG 134.0 07/02/2012   BNP (last 3 results)  Recent Labs  01/10/13 1048 01/13/13 0350 01/19/13 0515  PROBNP 763.5* 1080.0* 2665.0*    Diagnostic Studies/Procedures   Ct Chest W Contrast  01/21/2013  *RADIOLOGY REPORT*  Clinical Data: Status post left ventricular assist device (LVAD) placement.  CT CHEST WITH CONTRAST   Technique:  Multidetector CT imaging of the chest was performed following the standard protocol during bolus administration of intravenous contrast.  Contrast: 80mL OMNIPAQUE IOHEXOL 300 MG/ML  SOLN  Comparison: Chest CT 01/05/2013.  Findings:  LVAD:  Compared to the prior examination there has been interval placement of a left ventricular assist device.  The inflow cannula enters the apex of the left ventricle, and appears mildly angulated (approximately 50 degrees off axis relative to the long axis of the left ventricle), with tip directed toward the apical septum. However, there is considerable distance between the tip of the inflow cannula and the adjacent septum such that no potential obstruction is identified at this time.  There is a small amount of eccentric nonenhancing material around the proximal aspect of the outflow cannula, which is favored to represent excluded space between the two portions of the cannula, rather than eccentric clot.  No definite filling defect is otherwise noted within the inflow or outflow cannulas to suggest significant thrombus at this time (portions of both cannulas are incompletely visualized secondary to beam-hardening artifact).  The apical anastomosis appears intact, as does the aortic anastomosis.  No surrounding pseudoaneurysm associated with either of the anastomoses.  The left ventricular assist device pocket in the left upper quadrant of the abdomen is remarkable for a small amount of fluid and gas, which is likely within normal limits in this recent postoperative patient.  Mediastinum: Heart size is mildly enlarged. Small amount of pericardial fluid and/or thickening, unlikely to be of any hemodynamic significance at this time. There is atherosclerosis of the thoracic aorta, the great vessels of the mediastinum and the coronary arteries, including calcified atherosclerotic plaque in the the left main, left anterior descending, left circumflex and right coronary  arteries. The patient is status post median sternotomy for CABG, including a LIMA to the LAD.  All of the bypass grafts (LIMA to LAD, SVG to OM distribution, and SVG to distal RCA distribution) appear grossly patent on this non gated CT examination.  Numerous borderline enlarged presumably reactive mediastinal lymph nodes are noted.  Esophagus is unremarkable in appearance. A right upper extremity PICC is present with tip terminating in the distal superior vena cava.  Left-sided pacemaker device in place with single lead terminating in the right ventricular apex.  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  Trace left pleural effusion new compared to the prior examination.  7 mm nodule in the anterolateral aspect of the right lower lobe (image 44 of series 3)  is unchanged.  8 mm nodule in the anterolateral aspect of the right upper lobe (image 28 of series 3) is also unchanged.  There is a very subtle pattern of peripheral subpleural reticulation and mild diffuse interstitial prominence, which could suggest early changes of and interstitial lung disease such as asbestosis.  Mild centrilobular and paraseptal emphysema is also noted.  Upper Abdomen: The liver has a slightly nodular and irregular contour, suggestive of early cirrhosis.  Musculoskeletal: Sternotomy wires. There are no aggressive appearing lytic or blastic lesions noted in the visualized portions of the skeleton.  IMPRESSION: 1.  Interval placement of a LVAD, as above, which appears properly located without evidence of obstruction of either the inflow or outflow cannulas, or other acute complicating features. 2.  Trace left pleural effusion is new compared to the prior study. 3.  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 4.  Cirrhosis. 5.  Additional incidental findings, as  above, similar to prior studies.   Original Report Authenticated By: Trudie Reed, M.D.     Discharge Medications     Medication List    STOP taking these medications       carvedilol 12.5 MG tablet  Commonly known as:  COREG     ranitidine 150 MG tablet  Commonly known as:  ZANTAC      TAKE these medications       albuterol (2.5 MG/3ML) 0.083% nebulizer solution  Commonly known as:  PROVENTIL  Take 2.5 mg by nebulization every 6 (six) hours as needed.     aspirin 325 MG EC tablet  Take 1 tablet (325 mg total) by mouth daily.     budesonide-formoterol 160-4.5 MCG/ACT inhaler  Commonly known as:  SYMBICORT  Inhale 2 puffs into the lungs 2 (two) times daily.     citalopram 40 MG tablet  Commonly known as:  CELEXA  Take 1 tablet (40 mg total) by mouth daily.     docusate sodium 100 MG capsule  Commonly known as:  COLACE  Take 200 mg by mouth at bedtime.     ferrous fumarate-b12-vitamic C-folic acid capsule  Commonly known as:  TRINSICON / FOLTRIN  Take 1 capsule by mouth 3 (three) times daily after meals.     furosemide 40 MG tablet  Commonly known as:  LASIX  Take 1 tablet (40 mg total) by mouth 2 (two) times daily.     levothyroxine 100 MCG tablet  Commonly known as:  SYNTHROID, LEVOTHROID  Take 50 mcg by mouth at bedtime.     lisinopril 40 MG tablet  Commonly known as:  PRINIVIL,ZESTRIL  Take 20 mg by mouth at bedtime.     metoprolol succinate 25 MG 24 hr tablet  Commonly known as:  TOPROL-XL  Take 1 tablet (25 mg total) by mouth daily.     nitroGLYCERIN 0.4 MG SL tablet  Commonly known as:  NITROSTAT  Place 0.4 mg under the tongue every 5 (five) minutes as needed.     pantoprazole 40 MG tablet  Commonly known as:  PROTONIX  Take 1 tablet (40 mg total) by mouth daily.     potassium chloride SA 20 MEQ tablet  Commonly known as:  K-DUR,KLOR-CON  Take 1 tablet (20 mEq total) by mouth 2 (two) times daily.     simvastatin 80 MG tablet  Commonly  known as:  ZOCOR  Take 40 mg by mouth at bedtime.  tiotropium 18 MCG inhalation capsule  Commonly known as:  SPIRIVA  Place 18 mcg into inhaler and inhale daily.     traMADol 50 MG tablet  Commonly known as:  ULTRAM  Take 1 tablet (50 mg total) by mouth every 6 (six) hours as needed.     warfarin 5 MG tablet  Commonly known as:  COUMADIN  Take 1 tablet (5 mg total) by mouth daily.        Disposition   The patient will be discharged in stable condition to home. Discharge Orders   Future Appointments Provider Department Dept Phone   01/27/2013 1:30 PM [facility] [phone]   03/22/2013 10:05 AM [facility] Main Office [place]) [phone]   Future Orders Complete By Expires     ACE Inhibitor / ARB already ordered  As directed     Beta Blocker already ordered  As directed     Diet - low sodium heart healthy  As directed     Discharge instructions  As directed     Comments:      Remember to do self test on controller box and power module every day. Never sleep on batteries. Always leave Power Module [facility] power) plugged into the wall to not deplete internal battery that will run pump for 30 min if power goes out.  Do not lift anything more than 5 lbs until instructed by doctor. Do not use arms to press and put pressure on chest.    Discharge wound care:  As directed     Comments:      Driveline dressing sterile every day. Refer to instruction sheet in dressing kit. No showers.    Heart Failure patients record your daily weight using the same scale at the same time of day  As directed     Scheduling Instructions:      Record VAD parameters daily on flowsheet provided in your VAD binder. You obtain the numbers by pressing the home screen button on controller box the circle with square in it.    Increase activity slowly  As directed     STOP any activity that causes chest pain,  shortness of breath, dizziness, sweating, or exessive weakness  As directed       Follow-up Information   Follow up with Arvilla Meres, MD On 01/27/2013. (@ 1:30 pm Gate Code 0010)    Contact information:   [address] [place] [address] [phone]         Duration of Discharge Encounter: Greater than 35 minutes   Signed, Aundria Rud, NP-C 01/22/2013, 10:17 AM  Patient seen and examined with Ulla Potash, NP. We discussed all aspects of the encounter. I agree with the assessment and plan as stated above.   Patient s/p successful VAD implant. Post-op course relatively uncomplicated. Extensive education provided. He is ready for d/c.  Naliya Gish,MD 9:07 PM

## 2013-01-20 NOTE — Progress Notes (Signed)
Occupational Therapy Treatment Patient Details Name: [name] MRN: [id number] DOB: 1941-07-31 Today's Date: 01/20/2013 Time: 6962-9528 OT Time Calculation (min): 43 min  OT Assessment / Plan / Recommendation Comments on Treatment Session Pt continues to make progress. Wife present today with session. Pt with decreased c/o dyspnea with mobility. Discussed home set up with wife to increase independence and safety managing LVAD equipment. Pt will need 3 in 1 and rollator. Will benefit from continued care.    Follow Up Recommendations  Home health OT;Supervision/Assistance - 24 hour    Barriers to Discharge       Equipment Recommendations  3 in 1 bedside comode    Recommendations for Other Services    Frequency Min 3X/week   Plan Discharge plan remains appropriate    Precautions / Restrictions Precautions Precautions: Sternal Precaution Comments: LVAD (mod vc to follow) Required Braces or Orthoses: Other Brace/Splint Other Brace/Splint: LVAD black bag at all times Restrictions Weight Bearing Restrictions: Yes RUE Weight Bearing: Non weight bearing LUE Weight Bearing: Non weight bearing   Pertinent Vitals/Pain Vitals stable. Ambulated on 2L )@. unsare of rate, buy dyspnea 2/4.    ADL  ADL Comments: Focus of session on switching from battery to/from module system. vc for pt to written instructions posted in room. Pt able to donn system with minvc. Wife present for end of session. Questions addressed. PT requires cues to follow written instructions. Disucssed equipment needs with Karie Mainland.    OT Diagnosis:    OT Problem List:   OT Treatment Interventions:     OT Goals Acute Rehab OT Goals OT Goal Formulation: With patient Time For Goal Achievement: 01/28/13 Potential to Achieve Goals: Good ADL Goals Pt Will Perform Grooming: with supervision;Standing at sink ADL Goal: Grooming - Progress: Progressing toward goals Pt Will Perform Upper Body Bathing: with set-up;Sitting,  edge of bed;Sitting, chair;Unsupported ADL Goal: Upper Body Bathing - Progress: Progressing toward goals Pt Will Perform Lower Body Bathing: with set-up;Sit to stand from bed;Sit to stand from chair;Unsupported;with adaptive equipment ADL Goal: Lower Body Bathing - Progress: Progressing toward goals Pt Will Perform Upper Body Dressing: with set-up;Sitting, chair;Sitting, bed;Unsupported ADL Goal: Upper Body Dressing - Progress: Progressing toward goals Pt Will Perform Lower Body Dressing: with set-up;Sit to stand from chair;Sit to stand from bed;Unsupported;with adaptive equipment ADL Goal: Lower Body Dressing - Progress: Progressing toward goals Pt Will Transfer to Toilet: with supervision;Ambulation;with DME;Comfort height toilet ADL Goal: Toilet Transfer - Progress: Progressing toward goals Pt Will Perform Toileting - Clothing Manipulation: with set-up;Standing ADL Goal: Toileting - Clothing Manipulation - Progress: Progressing toward goals Pt Will Perform Toileting - Hygiene: with set-up;Sit to stand from 3-in-1/toilet ADL Goal: Toileting - Hygiene - Progress: Progressing toward goals Miscellaneous OT Goals Miscellaneous OT Goal #1: Pt will perform bed mobility with supervision as precursor for EOB ADLs. OT Goal: Miscellaneous Goal #1 - Progress: Progressing toward goals Miscellaneous OT Goal #2: Pt will independently manipulate all LVAD equipment in preparation for functional mobility.  OT Goal: Miscellaneous Goal #2 - Progress: Progressing toward goals  Visit Information  Last OT Received On: 01/20/13 Assistance Needed: +1    Subjective Data      Prior Functioning       Cognition  Cognition Arousal/Alertness: Awake/alert Behavior During Therapy: Flat affect Overall Cognitive Status: Difficult to assess (appears more depressed) Difficult to assess due to: Hard of hearing/deaf    Mobility  Bed Mobility Bed Mobility: Supine to Sit;Sitting - Scoot to Edge of Bed Supine to  Sit: 5: Supervision Sitting - Scoot to Edge of Bed: 5: Supervision Sit to Supine: 4: Min guard (vc to follow sternal precautions) Transfers Transfers: Sit to Stand;Stand to Sit Sit to Stand: 5: Supervision Stand to Sit: 5: Supervision    Exercises      Balance     End of Session OT - End of Session Equipment Utilized During Treatment: Gait belt Activity Tolerance: Patient tolerated treatment well Patient left: in bed;with nursing in room;with family/visitor present Nurse Communication: Mobility status  GO     Math Brazie,HILLARY 01/20/2013, 3:02 PM [facility], OTR/L  [phone] 01/20/2013

## 2013-01-20 NOTE — Progress Notes (Signed)
[facility] here now to see pt. We will follow him tomorrow. Melina Copa RN

## 2013-01-21 ENCOUNTER — Inpatient Hospital Stay (HOSPITAL_COMMUNITY): Payer: Medicare Other

## 2013-01-21 ENCOUNTER — Encounter (HOSPITAL_COMMUNITY): Payer: Self-pay | Admitting: Radiology

## 2013-01-21 DIAGNOSIS — I5022 Chronic systolic (congestive) heart failure: Secondary | ICD-10-CM

## 2013-01-21 DIAGNOSIS — I5023 Acute on chronic systolic (congestive) heart failure: Secondary | ICD-10-CM

## 2013-01-21 DIAGNOSIS — Z95818 Presence of other cardiac implants and grafts: Secondary | ICD-10-CM

## 2013-01-21 LAB — GLUCOSE, CAPILLARY
Glucose-Capillary: 96 mg/dL (ref 70–99)
Glucose-Capillary: 107 mg/dL — ABNORMAL HIGH (ref 70–99)
Glucose-Capillary: 112 mg/dL — ABNORMAL HIGH (ref 70–99)
Glucose-Capillary: 119 mg/dL — ABNORMAL HIGH (ref 70–99)

## 2013-01-21 LAB — TSH: TSH: 6.135 u[IU]/mL — ABNORMAL HIGH (ref 0.350–4.500)

## 2013-01-21 LAB — LACTATE DEHYDROGENASE: LDH: 288 U/L — ABNORMAL HIGH (ref 94–250)

## 2013-01-21 MED ORDER — IOHEXOL 300 MG/ML  SOLN
80.0000 mL | Freq: Once | INTRAMUSCULAR | Status: AC | PRN
  Administered 2013-01-21: 80 mL via INTRAVENOUS

## 2013-01-21 MED ORDER — FUROSEMIDE 40 MG PO TABS
40.0000 mg | ORAL_TABLET | Freq: Two times a day (BID) | ORAL | Status: DC
  Administered 2013-01-21 – 2013-01-22 (×2): 40 mg via ORAL
  Filled 2013-01-21 (×4): qty 1

## 2013-01-21 MED ORDER — WARFARIN SODIUM 5 MG PO TABS
5.0000 mg | ORAL_TABLET | Freq: Once | ORAL | Status: AC
  Administered 2013-01-21: 5 mg via ORAL
  Filled 2013-01-21: qty 1

## 2013-01-21 NOTE — Care Management Note (Signed)
    Page 1 of 2   01/22/2013     4:04:06 PM   CARE MANAGEMENT NOTE 01/22/2013  Patient:  [name], [name]   Account Number:  [id number]  Date Initiated:  01/11/2013  Documentation initiated by:  Junius Creamer  Subjective/Objective Assessment:   adm w heart failure     Action/Plan:   lives w wife, pcp dr Thayer Headings   Anticipated DC Date:  01/27/2013   Anticipated DC Plan:  HOME W HOME HEALTH SERVICES      DC Planning Services  CM consult      [facility] Choice  HOME HEALTH   Choice offered to / List presented to:  C-1 Patient   DME arranged  Levan Hurst      DME agency  [facility]     [facility] arranged  HH-1 RN  HH-2 PT      [facility] agency  [facility]   Status of service:  Completed, signed off Medicare Important Message given?   (If response is "NO", the following Medicare IM given date fields will be blank) Date Medicare IM given:   Date Additional Medicare IM given:    Discharge Disposition:  HOME W HOME HEALTH SERVICES  Per UR Regulation:  Reviewed for med. necessity/level of care/duration of stay  If discussed at Long Length of Stay Meetings, dates discussed:    Comments:  ContactZiyon, [place] Spouse [phone]   Maureen Ralphs [phone]  01/22/13 Iver Miklas,RN,BSN [phone] PT FOR DC HOME TODAY.  PT DOES NOT QUALIFY FOR HOME OXYGEN, AS RA SATS IN THE 90S WITH AMBULATION AND AT REST.  [facility] NOTIFIED OF DC TODAY.  01/21/13 Mayrene Bastarache,RN,BSN [phone] MET WITH PT TO FINALIZE DC PLANS; POSSIBLE DC TOMORROW.  PT HAS [facility] BENEFITS AS WELL AS BLUE MEDICARE.  ATTEMPTED TO [facility]RANGE [facility]; HOWEVER, [facility]  MD WOULD HAVE TO FOLLOW PT POST SURGERY. PT STATES HE DOES NOT WANT TO DO THIS, HE WANTS TO FOLLOW WITH DR VAN TRIGT POST DC FOR HH ORDERS, ETC, B/C "HE IS THE EXPERT."  WILL ARRANGE HH SERVICES WITH [facility], PER CHOICE.  ROLLATOR TO BE DELIVERED TO PT ROOM PRIOR TO DC. PT HAS BSC AT HOME.  PT APPRECIATIVE OF HELP.  01-14-13  1:45pm Avie Arenas, RNBSN - [phone] Talked with wife, Lucendia Herrlich and patient - who is now extubated in room.  Both have a large community and church support along with family members to assist at discharge.  CM will continue to follow for further discharge needs.   01-13-13 8:45am Avie Arenas, RNBSN [phone] Post op Lvad on 01-12-14.  Patient still intubated - no family in room.  CM will continue to follow.  4/14 0804 debbie dowell rn,bsn

## 2013-01-21 NOTE — Progress Notes (Signed)
PT Cancellation Note  Patient Details Name: [name] MRN: [id number] DOB: [birth year]   Cancelled Treatment:    Reason Eval/Treat Not Completed: Patient at procedure or test/unavailable. Pt having an US done at this time; will check back if able, otherwise will encourage ambulation with nsg and will see tomorrow.   Fabio Asa 01/21/2013, 1:45 PM

## 2013-01-21 NOTE — Progress Notes (Signed)
Pt refusing to walk again, he is tired/not feeling good/sad about dog.  Will try again later

## 2013-01-21 NOTE — Progress Notes (Signed)
HeartMate 2 Rounding Note  Subjective:    [name] is a 72 year old gentleman with a history of CABG (2003), Chronic systolic HF EF 20-25% %, S/P AICD (generator change 2011), hyperlipidemia, hypothyroidism, emphysema, OSA, intermittent atrial fibrillation (on coumadin). Quit smoking 2003. Uses CPAP and O2 every night. He is a disabled [employer]. He was referred to the AHF clinic by Dr. Elease Hashimoto and over the past couple of months his functional capacity has really declined. He has class IV heart failure symptoms and has not improved on OMM. His CPX in 11/13 showed a VO2 of 9.2%, VE/VCO2- 52.8, OUES 1.15 and RER= 1.02. We have been following him closely and have had multiple discussions about his options and the patient decided that he wanted to proceed with LVAD placement. I discussed the patient with Dr Romona Curls at [facility] who agreed that the patient was an acceptable DT VAD candidate- his pulm hx of asbestosis, low DLCO are a problem for transplant candidacy  Admitted 01/10/13 for optimization before LVAD implant with acute on chronic heart failure.. EF by LV gram 20% 01/07/13  POD#9 Patient stable overnight. Walking in the halls with O2, however still refusing some walks. Denies any CP, orthopnea, or edema. Patients dog is getting put to sleep today and was very down yesterday, increased Celexa to 40 mg. Weight down 8 lbs, do not think accurate and will re-weigh. Complaints of tremors, d/cd reglan and awaiting TSH. MAPs 60-80s.  INR 2.33 LDH 251>288  LVAD INTERROGATION:  HeartMate II LVAD:  Flow 5.7 liters/min, speed 9200, power 5.3, PI 5.1  No PI events overnight  Objective:    Vital Signs:   Temp:  [97.7 F (36.5 C)-98.9 F (37.2 C)] 97.7 F (36.5 C) (04/24 0403) Pulse Rate:  [92-96] 95 (04/24 0403) Resp:  [18] 18 (04/24 0403) SpO2:  [92 %-94 %] 94 % (04/24 0403) Weight:  [214 lb 14.4 oz (97.478 kg)] 214 lb 14.4 oz (97.478 kg) (04/24 0300) Last BM Date: 01/19/13 Mean arterial Pressure  86  Intake/Output:   Intake/Output Summary (Last 24 hours) at 01/21/13 0659 Last data filed at 01/20/13 1630  Gross per 24 hour  Intake    720 ml  Output      0 ml  Net    720 ml     Physical Exam: General: NAD, Sitting in chair  HEENT: normal Neck: supple. JVD flat . Carotids -  no bruits. No lymphadenopathy or thryomegaly appreciated. Cor: Mechanical heart sounds with LVAD hum present Lungs: CTA. Abdomen: soft, nontender, distention. No hepatosplenomegaly. No bruits or masses. + bowel sounds. Driveline: Dressing C/D/I and securement device intact; skin pink where aquacel lays on skin for driveline dressing Extremities: no cyanosis, clubbing, rash, edema extremities warm and well perfused Neuro: alert & orientedx3, cranial nerves grossly intact. moves all 4 extremities w/o difficulty  Telemetry: Afib 93 bpm  Labs: Basic Metabolic Panel:  Recent Labs Lab 01/15/13 0400 01/16/13 0355 01/17/13 0341 01/18/13 0330 01/19/13 0515 01/20/13 0445  NA 129* 133* 130* 131* 132* 132*  K 4.2 3.6 3.8 3.8 4.4 4.4  CL 95* 98 97 95* 97 98  CO2 31 29 30 29 27 25   GLUCOSE 119* 115* 82 104* 99 142*  BUN 17 27* 24* 19 25* 30*  CREATININE 0.67 0.72 0.68 0.69 0.75 0.82  CALCIUM 9.3 9.2 9.2 9.7 10.0 10.0  MG 2.0 2.0 2.1 2.1  --   --   PHOS 2.4  --   --   --   --   --  Liver Function Tests:  Recent Labs Lab 01/15/13 0830 01/17/13 0341 01/20/13 0445  AST 30 23 33  ALT 11 11 18   ALKPHOS 42 43 48  BILITOT 0.7 0.5 0.7  PROT 5.8* 5.9* 6.5  ALBUMIN 2.8* 2.7* 3.1*    Recent Labs Lab 01/15/13 0830  AMYLASE 16   No results found for this basename: AMMONIA,  in the last 168 hours  CBC:  Recent Labs Lab 01/15/13 0400 01/15/13 1500 01/16/13 0355 01/17/13 0341 01/18/13 0330 01/20/13 0445  WBC 9.8 8.6 8.8 8.4 7.7 9.8  NEUTROABS 8.1*  --  6.8 6.0 5.4  --   HGB 8.2* 7.8* 7.8* 8.0* 8.4* 8.3*  HCT 25.0* 23.8* 24.7* 25.4* 25.9* 26.8*  MCV 81.2 81.5 81.8 82.2 82.0 84.3  PLT  97* 92* 109* 111* 148* 209    INR:  Recent Labs Lab 01/17/13 0341 01/18/13 0330 01/19/13 0515 01/20/13 0445 01/21/13 0327  INR 1.43 1.83* 2.33* 2.30* 2.33*    Other results:     Imaging: Dg Chest 2 View  01/20/2013  *RADIOLOGY REPORT*  Clinical Data: Ventricular assist device  CHEST - 2 VIEW  Comparison: 01/18/2013  Findings: Left ventricular assist device noted.  Stable enlarged heart silhouette.  There is bibasilar atelectasis unchanged from prior.  Small effusions present.  No pulmonary edema.  The right PICC line noted.  IMPRESSION: 1.  No interval change. 2.  Bibasilar atelectasis and small effusions.   Original Report Authenticated By: Genevive Bi, M.D.      Medications:     Scheduled Medications: . aspirin EC  325 mg Oral Daily  . bisacodyl  10 mg Oral Daily  . budesonide-formoterol  2 puff Inhalation BID  . citalopram  40 mg Oral Daily  . docusate sodium  200 mg Oral Daily  . feeding supplement  237 mL Oral BID BM  . ferrous fumarate-b12-vitamic C-folic acid  1 capsule Oral TID PC  . furosemide  40 mg Oral Daily  . hydrocortisone   Rectal TID  . insulin aspart  0-24 Units Subcutaneous TID AC & HS  . insulin glargine  12 Units Subcutaneous QHS  . levothyroxine  50 mcg Oral QHS  . metoprolol succinate  25 mg Oral Daily  . pantoprazole  40 mg Oral Daily  . potassium chloride  20 mEq Oral BID  . tiotropium  18 mcg Inhalation Daily  . Warfarin - Pharmacist Dosing Inpatient   Does not apply q1800    Infusions:    PRN Medications: acetaminophen, oxyCODONE, sodium chloride, traMADol   Assessment:   1) Acute on chronic HF - class IV  2) Chronic AF on coumadin  3) ICM, EF 20%        S/p LVAD Heartmate II implant 01/12/13 4) CAD  S/p CABG 2003  5) COPD with depressed DLCO  CPAP at night  6) HLD  7) Hypothyroidism  8) Expected blood loss anemia- LDH transiently increased prob due to banked blood breakdown but will follow for evidence of hemolysis.  Continue oral iron and folate 9) Depression  Plan/Discussion:    POD #9 Patient continues to progress. Continue to encourage patient to ambulate TID in the halls. Will go for chest CT today to assess inflow and outflow cannulas before discharge. He will also have RAMP ECHO to see if speed is optimized.   Patient and wife to take patient education verification test today and goal is to d/c home tomorrow with home health and O2 for ambulation.   I  reviewed the LVAD parameters from today, and compared the results to the patient's prior recorded data. No programming changes were made. The LVAD is functioning within specified parameters. Patient performing LVAD self-test daily. LVAD interrogation was negative for any significant power changes, alarms, or PI events or speed drops. LVAD equipment check completed and is in good working order. Backup equipment present. LVAD education done on emergency procedures and precautions and reviewed drive line exit site care.  Ulla Potash B 01/21/2013, 6:59 AM Length of Stay: 8 6:59 AM  Patient seen with NP, agree with the above note.  Mild volume overload, continue current Lasix dosing.  Weight is down.  Will get ramp study today.   Marca Ancona 01/21/2013 10:45 AM

## 2013-01-21 NOTE — Progress Notes (Signed)
CARDIAC REHAB PHASE I   PRE:  Rate/Rhythm: 88 afib    BP: sitting 88    SaO2: 96 RA  MODE:  Ambulation: 240 ft   POST:  Rate/Rhythm: 104    BP: sitting 108, 110 second time     SaO2: 98 RA  Pt reluctant to walk, needs encouragement. Donned equipment independently but needed VCs to put on vest first and take bag. Exerted easily with x2 rest breaks, several minutes each. Sts he is SOB but much less than PTA. Sts he was smothering PTA. To recliner after walk. Left on batteries until after testing. BP elevated after walk at 108-110.  [id number]  Elissa Lovett Stanley CES, ACSM 01/21/2013 11:50 AM

## 2013-01-21 NOTE — Progress Notes (Signed)
Occupational Therapy Treatment Patient Details Name: DEMETRIUS MAHLER MRN: [id number] DOB: 05/24/1941 Today's Date: 01/21/2013 Time: 7829-5621 OT Time Calculation (min): 28 min  OT Assessment / Plan / Recommendation Comments on Treatment Session Pt appears depressed today - Pt's wife apparently had to euthanize their dog today. Completed education regarding sequencing of LVAD equipment and importance of following same pattern routinely to improve safety and indpendence. Written handout given again. Discussed home safety and E conservation. Also discussed the importance of keeping the control module plugged in AT ALL TIMES! Feel pt is appropriate for D/C home. Recommend HHSW to follow pt for support. Discussed recommendations with Karie Mainland.    Follow Up Recommendations  Home health OT;Supervision/Assistance - 24 hour    Barriers to Discharge       Equipment Recommendations  None recommended by OT    Recommendations for Other Services  HHSW  Frequency Min 3X/week   Plan Discharge plan remains appropriate    Precautions / Restrictions Precautions Precautions: Sternal Precaution Comments: LVAD Required Braces or Orthoses: Other Brace/Splint Other Brace/Splint: LVAD black bag at all times Restrictions Weight Bearing Restrictions: Yes RUE Weight Bearing: Non weight bearing LUE Weight Bearing: Non weight bearing   Pertinent Vitals/Pain no apparent distress     ADL  ADL Comments: focus of session on educating pt/wife on sequencing of LVAD equipment, home set up, E conservation, use of rollator and importance of keeping control module plugged in AT ALL TIMES.    OT Diagnosis:    OT Problem List:   OT Treatment Interventions:     OT Goals Acute Rehab OT Goals OT Goal Formulation: With patient Time For Goal Achievement: 01/28/13 Potential to Achieve Goals: Good ADL Goals Pt Will Perform Grooming: with supervision;Standing at sink ADL Goal: Grooming - Progress: Met Pt Will Perform  Upper Body Bathing: with set-up;Sitting, edge of bed;Sitting, chair;Unsupported ADL Goal: Upper Body Bathing - Progress: Met Pt Will Perform Lower Body Bathing: with set-up;Sit to stand from bed;Sit to stand from chair;Unsupported;with adaptive equipment ADL Goal: Lower Body Bathing - Progress: Progressing toward goals Pt Will Perform Upper Body Dressing: with set-up;Sitting, chair;Sitting, bed;Unsupported ADL Goal: Upper Body Dressing - Progress: Progressing toward goals Pt Will Perform Lower Body Dressing: with set-up;Sit to stand from chair;Sit to stand from bed;Unsupported;with adaptive equipment ADL Goal: Lower Body Dressing - Progress: Progressing toward goals Pt Will Transfer to Toilet: with supervision;Ambulation;with DME;Comfort height toilet ADL Goal: Toilet Transfer - Progress: Met Pt Will Perform Toileting - Clothing Manipulation: with set-up;Standing ADL Goal: Toileting - Clothing Manipulation - Progress: Met Pt Will Perform Toileting - Hygiene: with set-up;Sit to stand from 3-in-1/toilet ADL Goal: Toileting - Hygiene - Progress: Met Miscellaneous OT Goals Miscellaneous OT Goal #1: Pt will perform bed mobility with supervision as precursor for EOB ADLs. OT Goal: Miscellaneous Goal #1 - Progress: Met Miscellaneous OT Goal #2: Pt will independently manipulate all LVAD equipment in preparation for functional mobility.  OT Goal: Miscellaneous Goal #2 - Progress: Progressing toward goals  Visit Information  Last OT Received On: 01/21/13 Assistance Needed: +1    Subjective Data      Prior Functioning       Cognition  Cognition Arousal/Alertness: Awake/alert Behavior During Therapy: Flat affect Overall Cognitive Status:  (apparnet STM deficits) Difficult to assess due to: Hard of hearing/deaf    Mobility  Transfers Transfers: Sit to Stand;Stand to Sit Sit to Stand: 6: Modified independent (Device/Increase time) Stand to Sit: 6: Modified independent (Device/Increase  time) Details for Transfer  Assistance: vc for cord awareness    Exercises      Balance  s   End of Session OT - End of Session Activity Tolerance: Patient tolerated treatment well Patient left: in bed;with call bell/phone within reach;with nursing in room Nurse Communication: Mobility status;Precautions;Weight bearing status  GO     Zayvon Alicea,HILLARY 01/21/2013, 4:51 PM [facility], OTR/L  [phone] 01/21/2013

## 2013-01-21 NOTE — Progress Notes (Signed)
NUTRITION FOLLOW UP  Intervention:    Continue Ensure Complete twice daily (350 kcals, 13 gm protein per 8 fl oz bottle) RD to continue to follow  Nutrition Dx:   Inadequate oral intake, resolved  Goal:   Oral intake with meals & supplements to meet >/= 90% of estimated nutrition needs, met  Monitor:   PO & supplemental intake, weight, labs, I/O's  Assessment:   Patient s/p procedure 4/15:  INSERTION OF IMPLANTABLE LEFT VENTRICULAR ASSIST DEVICE  PO intake 75-100% per flowsheet records.  Receiving Ensure Complete supplement twice daily.  Goal is for patient to D/C tomorrow with HH and O2 ambulation; for chest CT today to assess inflow and outflow cannulas.  Height: Ht Readings from Last 1 Encounters:  01/12/13 6' (1.829 m)    Weight Status:   Wt Readings from Last 1 Encounters:  01/21/13 214 lb 14.4 oz (97.478 kg)    Re-estimated needs:  Kcal: 2000-2200 Protein: 110-130 gm Fluid: 2.0-2.2 L  Skin: sternal incision   Diet Order: Cardiac   Intake/Output Summary (Last 24 hours) at 01/21/13 0946 Last data filed at 01/20/13 1630  Gross per 24 hour  Intake    480 ml  Output      0 ml  Net    480 ml    Last BM: 4/23  Labs:   Recent Labs Lab 01/15/13 0400 01/16/13 0355 01/17/13 0341 01/18/13 0330 01/19/13 0515 01/20/13 0445  NA 129* 133* 130* 131* 132* 132*  K 4.2 3.6 3.8 3.8 4.4 4.4  CL 95* 98 97 95* 97 98  CO2 31 29 30 29 27 25   BUN 17 27* 24* 19 25* 30*  CREATININE 0.67 0.72 0.68 0.69 0.75 0.82  CALCIUM 9.3 9.2 9.2 9.7 10.0 10.0  MG 2.0 2.0 2.1 2.1  --   --   PHOS 2.4  --   --   --   --   --   GLUCOSE 119* 115* 82 104* 99 142*    CBG (last 3)   Recent Labs  01/20/13 1618 01/20/13 2124 01/21/13 0542  GLUCAP 114* 160* 96    Scheduled Meds: . aspirin EC  325 mg Oral Daily  . bisacodyl  10 mg Oral Daily  . budesonide-formoterol  2 puff Inhalation BID  . citalopram  40 mg Oral Daily  . docusate sodium  200 mg Oral Daily  . feeding  supplement  237 mL Oral BID BM  . ferrous fumarate-b12-vitamic C-folic acid  1 capsule Oral TID PC  . furosemide  40 mg Oral Daily  . hydrocortisone   Rectal TID  . insulin aspart  0-24 Units Subcutaneous TID AC & HS  . insulin glargine  12 Units Subcutaneous QHS  . levothyroxine  50 mcg Oral QHS  . metoprolol succinate  25 mg Oral Daily  . pantoprazole  40 mg Oral Daily  . potassium chloride  20 mEq Oral BID  . tiotropium  18 mcg Inhalation Daily  . Warfarin - Pharmacist Dosing Inpatient   Does not apply q1800    Continuous Infusions:   Maureen Chatters, RD, LDN Pager #: [phone] After-Hours Pager #: [phone]

## 2013-01-21 NOTE — Progress Notes (Signed)
Patient refused walking this evening.  Will continue to educate on importance of walking and provide encouragement.  Arva Chafe

## 2013-01-21 NOTE — Progress Notes (Signed)
ANTICOAGULATION CONSULT NOTE   Pharmacy Consult for warfarin  Indication: atrial fibrillation and LVAD placed 4/15  No Known Allergies  Patient Measurements: Height: 6' (182.9 cm) Weight: 220 lb 14.4 oz (100.2 kg) IBW/kg (Calculated) : 77.6   Recent Labs  01/19/13 0515 01/20/13 0445 01/21/13 0327  HGB  --  8.3*  --   HCT  --  26.8*  --   PLT  --  209  --   LABPROT 24.5* 24.3* 24.5*  INR 2.33* 2.30* 2.33*  CREATININE 0.75 0.82  --     Estimated Creatinine Clearance: 101.2 ml/min (by C-G formula based on Cr of 0.82).  Medications:  PTA Warfarin 5mg  daily except 2.5mg  on Thursday  Assessment: 72 year old male now POD#9 from LVAD. Patient was on coumadin prior to admission for afib and was on heparin bridge prior to surgery. INR therapeutic today. No bleeding noted in chart except for complaints of bleeding hemorrhoids.  Hgb low but appears stable, platelet count rising.  Goal of Therapy:  INR 2-2.5 per Dr. Zenaida Niece Trigt's note Monitor platelets by anticoagulation protocol: Yes   Plan:  1) Repeat warfarin 5mg  x 1 tonight.  May need to reduce to home dose soon. 2) F/u daily INR 3) Continue to monitor signs/symptoms of bleeding  Tad Moore, BCPS  Clinical Pharmacist Pager [phone]  01/21/2013 11:01 AM

## 2013-01-21 NOTE — Progress Notes (Signed)
  Echocardiogram 2D Echocardiogram RAMP study has been performed.  Jorje Guild 01/21/2013, 1:58 PM

## 2013-01-21 NOTE — Progress Notes (Signed)
RAMP ECHO   Speed    Flow     PI        Power       LVIDD      AI          MR          AoV         Septum   9200        4.5L      6.0      5.3              4.97cm    None     Trace       Closed     Good  9400        5.2L      5.0      6.1              4.4cm      None     Trivial       Closed     Slight pull L  9600        5.0L      4.6      6.2              4.14cm    None     Trivial       Closed     Septum pull L   Performed RAMP ECHO at bedside. Patients speed was left at 9200 with back-up speed at 8600.

## 2013-01-21 NOTE — Progress Notes (Signed)
Pt states that he is not interested in using our CPAP machine tonight. He does wear one at home and is being discharged tomorrow and feels no need to wear. Pt understands to call if he changes his mind.

## 2013-01-21 NOTE — Progress Notes (Signed)
HeartMate 2 Rounding Note postop day 9 status post redo sternotomy and HeartMate 2 implantation  Subjective:    [name] is a 72 year old gentleman with a history of CABG (2003), Chronic systolic HF EF 20-25% %, S/P AICD (generator change 2011), hyperlipidemia, hypothyroidism, emphysema, OSA, intermittent atrial fibrillation (on coumadin). Quit smoking 2003. Uses CPAP and O2 every night. He is a disabled [employer]. He was referred to the AHF clinic by Dr. Elease Hashimoto and over the past couple of months his functional capacity has really declined. He has class IV heart failure symptoms and has not improved on OMM. His CPX in 11/13 showed a VO2 of 9.2%, VE/VCO2- 52.8, OUES 1.15 and RER= 1.02. We have been following him closely and have had multiple discussions about his options and the patient decided that he wanted to proceed with LVAD placement. I discussed the patient with Dr Romona Curls at [facility] who agreed that the patient was an acceptable DT VAD candidate- his pulm hx of asbestosis, low DLCO are a problem for transplant candidacy  Admitted 01/10/13 for optimization before LVAD implant with acute on chronic heart failure.. EF by LV gram 20% 01/07/13  POD#9 Walked> 500 ft today- nausea resolved, needs O2 w/ ambulation Inotropes all weaned off-last co- ox 68% Controlled afib Wt down 1 lbs now on po lasix Hemoglobin up to 8.2 on oral iron and diuresis   Minimal PI events, , continue LVAD speed  at 9200 RPM Pump pocket drain removed, surgical incisions healing well. Nausea resolved, KUB reviewed Successful teaching completed about driveline and equipment predischarge testing is planned today.  LVAD INTERROGATION:  HeartMate II LVAD:  Flow 6.2liters/min, speed 9200, power 6.1, PI 5.6  No PI events  Objective:    Vital Signs:   Temp:  [97.7 F (36.5 C)-98.9 F (37.2 C)] 97.7 F (36.5 C) (04/24 0403) Pulse Rate:  [92-96] 95 (04/24 0403) Resp:  [18] 18 (04/24 0403) SpO2:  [92 %-95 %] 95 % (04/24  0954) Weight:  [214 lb 14.4 oz (97.478 kg)] 214 lb 14.4 oz (97.478 kg) (04/24 0300) Last BM Date: 01/20/13 Mean arterial Pressure 70-80's  Intake/Output: Even   Intake/Output Summary (Last 24 hours) at 01/21/13 1005 Last data filed at 01/20/13 1630  Gross per 24 hour  Intake    480 ml  Output      0 ml  Net    480 ml     Physical Exam: General: NAD, Laying in bed HEENT: normal Neck: supple. CVP 8-9 . Carotids -  no bruits. No lymphadenopathy or thryomegaly appreciated. Cor: Mechanical heart sounds with LVAD hum present.-  Lungs: diminished in the bases Abdomen: soft, nontender, no distention. No hepatosplenomegaly. No bruits or masses. no bowel sounds. Driveline: Dressing C/D/I and securement device intact Extremities: no cyanosis, clubbing, rash, edema extremities warm and well perfused Neuro: alert & orientedx3, cranial nerves grossly intact. moves all 4 extremities w/o difficulty Sternal incision clean and dry   Telemetry: Afib 95 bpm  Labs: Basic Metabolic Panel:  Recent Labs Lab 01/15/13 0400 01/16/13 0355 01/17/13 0341 01/18/13 0330 01/19/13 0515 01/20/13 0445  NA 129* 133* 130* 131* 132* 132*  K 4.2 3.6 3.8 3.8 4.4 4.4  CL 95* 98 97 95* 97 98  CO2 31 29 30 29 27 25   GLUCOSE 119* 115* 82 104* 99 142*  BUN 17 27* 24* 19 25* 30*  CREATININE 0.67 0.72 0.68 0.69 0.75 0.82  CALCIUM 9.3 9.2 9.2 9.7 10.0 10.0  MG 2.0 2.0 2.1 2.1  --   --  PHOS 2.4  --   --   --   --   --     Liver Function Tests:  Recent Labs Lab 01/15/13 0830 01/17/13 0341 01/20/13 0445  AST 30 23 33  ALT 11 11 18   ALKPHOS 42 43 48  BILITOT 0.7 0.5 0.7  PROT 5.8* 5.9* 6.5  ALBUMIN 2.8* 2.7* 3.1*    Recent Labs Lab 01/15/13 0830  AMYLASE 16   No results found for this basename: AMMONIA,  in the last 168 hours  CBC:  Recent Labs Lab 01/15/13 0400 01/15/13 1500 01/16/13 0355 01/17/13 0341 01/18/13 0330 01/20/13 0445  WBC 9.8 8.6 8.8 8.4 7.7 9.8  NEUTROABS 8.1*  --   6.8 6.0 5.4  --   HGB 8.2* 7.8* 7.8* 8.0* 8.4* 8.3*  HCT 25.0* 23.8* 24.7* 25.4* 25.9* 26.8*  MCV 81.2 81.5 81.8 82.2 82.0 84.3  PLT 97* 92* 109* 111* 148* 209    INR:  Recent Labs Lab 01/17/13 0341 01/18/13 0330 01/19/13 0515 01/20/13 0445 01/21/13 0327  INR 1.43 1.83* 2.33* 2.30* 2.33*    Other results:  EKG:   Imaging: Dg Chest 2 View  01/20/2013  *RADIOLOGY REPORT*  Clinical Data: Ventricular assist device  CHEST - 2 VIEW  Comparison: 01/18/2013  Findings: Left ventricular assist device noted.  Stable enlarged heart silhouette.  There is bibasilar atelectasis unchanged from prior.  Small effusions present.  No pulmonary edema.  The right PICC line noted.  IMPRESSION: 1.  No interval change. 2.  Bibasilar atelectasis and small effusions.   Original Report Authenticated By: Genevive Bi, M.D.      Medications:     Scheduled Medications: . aspirin EC  325 mg Oral Daily  . bisacodyl  10 mg Oral Daily  . budesonide-formoterol  2 puff Inhalation BID  . citalopram  40 mg Oral Daily  . docusate sodium  200 mg Oral Daily  . feeding supplement  237 mL Oral BID BM  . ferrous fumarate-b12-vitamic C-folic acid  1 capsule Oral TID PC  . furosemide  40 mg Oral Daily  . hydrocortisone   Rectal TID  . insulin aspart  0-24 Units Subcutaneous TID AC & HS  . insulin glargine  12 Units Subcutaneous QHS  . levothyroxine  50 mcg Oral QHS  . metoprolol succinate  25 mg Oral Daily  . pantoprazole  40 mg Oral Daily  . potassium chloride  20 mEq Oral BID  . tiotropium  18 mcg Inhalation Daily  . Warfarin - Pharmacist Dosing Inpatient   Does not apply q1800    Infusions:   none, PICC line will be removed prior to discharge  PRN Medications: acetaminophen, oxyCODONE, sodium chloride, traMADol   Assessment:   1) Acute on chronic HF - class IV  2) Chronic AF on coumadin  3) ICM, EF 20%        S/p LVAD Heartmate II implant 01/12/13 4) CAD  S/p CABG 2003  5) COPD with  depressed DLCO  CPAP at night  6) HLD  7) Hypothyroidism  8) Expected blood loss anemia- hemoglobin improving on oral iron and folate, last hemoglobin 8.3 Plan/Discussion:    POD #9  Plan Anticoagulation reached goal 2-2.5 Much stronger now walking tol beta blocker for afib controll Will need echo ramp study to set speed for DC home Will get CT chest w/o contrast prior to DC home Length of Stay: 11   I reviewed the LVAD parameters from today, and compared the results  to the patient's prior recorded data. No programming changes were made. The LVAD is functioning within specified parameters. Nursing performing LVAD self-test daily. LVAD interrogation was negative for any significant power changes, alarms, or PI events or speed drops. LVAD equipment check completed and is in good working order. Backup equipment present. LVAD education done on emergency procedures and precautions and reviewed drive line exit site care.  VAN TRIGT III,PETER 01/21/2013, 10:05 AM

## 2013-01-22 DIAGNOSIS — F341 Dysthymic disorder: Secondary | ICD-10-CM

## 2013-01-22 DIAGNOSIS — F329 Major depressive disorder, single episode, unspecified: Secondary | ICD-10-CM

## 2013-01-22 LAB — GLUCOSE, CAPILLARY
Glucose-Capillary: 95 mg/dL (ref 70–99)
Glucose-Capillary: 94 mg/dL (ref 70–99)

## 2013-01-22 LAB — BASIC METABOLIC PANEL
BUN: 27 mg/dL — ABNORMAL HIGH (ref 6–23)
CO2: 27 mEq/L (ref 19–32)
Chloride: 98 mEq/L (ref 96–112)
Glucose, Bld: 98 mg/dL (ref 70–99)
Potassium: 4.1 mEq/L (ref 3.5–5.1)

## 2013-01-22 LAB — CBC
HCT: 27.6 % — ABNORMAL LOW (ref 39.0–52.0)
Hemoglobin: 8.3 g/dL — ABNORMAL LOW (ref 13.0–17.0)
MCHC: 30.1 g/dL (ref 30.0–36.0)
RBC: 3.21 MIL/uL — ABNORMAL LOW (ref 4.22–5.81)

## 2013-01-22 LAB — PROTIME-INR
INR: 2.13 — ABNORMAL HIGH (ref 0.00–1.49)
Prothrombin Time: 22.9 seconds — ABNORMAL HIGH (ref 11.6–15.2)

## 2013-01-22 MED ORDER — ASPIRIN 325 MG PO TBEC: 325.0000 mg | DELAYED_RELEASE_TABLET | Freq: Every day | ORAL | Status: DC

## 2013-01-22 MED ORDER — WARFARIN SODIUM 5 MG PO TABS: 5.0000 mg | ORAL_TABLET | Freq: Every day | ORAL | Status: DC

## 2013-01-22 MED ORDER — WARFARIN SODIUM 7.5 MG PO TABS
7.5000 mg | ORAL_TABLET | Freq: Once | ORAL | Status: AC
  Administered 2013-01-22: 7.5 mg via ORAL
  Filled 2013-01-22: qty 1

## 2013-01-22 MED ORDER — FE FUMARATE-B12-VIT C-FA-IFC PO CAPS: 1.0000 | ORAL_CAPSULE | Freq: Three times a day (TID) | ORAL | Status: DC

## 2013-01-22 MED ORDER — PANTOPRAZOLE SODIUM 40 MG PO TBEC: 40.0000 mg | DELAYED_RELEASE_TABLET | Freq: Every day | ORAL | Status: DC

## 2013-01-22 MED ORDER — METOPROLOL SUCCINATE ER 25 MG PO TB24: 25.0000 mg | ORAL_TABLET | Freq: Every day | ORAL | Status: DC

## 2013-01-22 MED ORDER — TRAMADOL HCL 50 MG PO TABS: 50.0000 mg | ORAL_TABLET | Freq: Four times a day (QID) | ORAL | Status: DC | PRN

## 2013-01-22 MED ORDER — POTASSIUM CHLORIDE CRYS ER 20 MEQ PO TBCR: 20.0000 meq | EXTENDED_RELEASE_TABLET | Freq: Two times a day (BID) | ORAL | Status: DC

## 2013-01-22 MED ORDER — FUROSEMIDE 40 MG PO TABS: 40.0000 mg | ORAL_TABLET | Freq: Two times a day (BID) | ORAL | Status: DC

## 2013-01-22 MED ORDER — CITALOPRAM HYDROBROMIDE 40 MG PO TABS: 40.0000 mg | ORAL_TABLET | Freq: Every day | ORAL | Status: DC

## 2013-01-22 NOTE — Progress Notes (Signed)
Physical Therapy Treatment Patient Details Name: [name] MRN: [id number] DOB: Feb 24, 1941 Today's Date: 01/22/2013 Time: 5621-3086 PT Time Calculation (min): 29 min  PT Assessment / Plan / Recommendation Comments on Treatment Session  Pt demonstrates better stability with ambulation using the rollator today.  Worked with patient switching to battery power. Patient required multiple VCs and redirection to printed instructions to assist. Patient with difficulty despite being recued to follow directions specifically as indicated. Some concerns regarding patients understanding of risks at this point. Patient states hes going to stay on batteries all day regardless of conversation prompting patient to remain on terminal if potential to nap exists. Rec continued assist/supervision at all times when converting to and from power sources. Additionally, rec supervision when off terminal source.  HHPT for follow up upon discharge.    Follow Up Recommendations  Home health PT;Supervision/Assistance - 24 hour           Equipment Recommendations  Other (comment) Aeronautical engineer)    Recommendations for Other Services    Frequency Min 5X/week   Plan Discharge plan remains appropriate;Frequency remains appropriate    Precautions / Restrictions Precautions Precautions: Sternal Precaution Comments: LVAD Required Braces or Orthoses: Other Brace/Splint Other Brace/Splint: LVAD black bag at all times   Pertinent Vitals/Pain No pain at this time    Mobility  Bed Mobility Bed Mobility: Not assessed Transfers Transfers: Sit to Stand;Stand to Sit Sit to Stand: 6: Modified independent (Device/Increase time) Stand to Sit: 6: Modified independent (Device/Increase time) Details for Transfer Assistance: VCs for sternal precautions and not pushing through arms Ambulation/Gait Ambulation/Gait Assistance: 4: Min guard Ambulation Distance (Feet): 200 Feet Assistive device: Rollator Ambulation/Gait  Assistance Details: on rm air; multiple rest breaks Gait Pattern: Step-through pattern;Decreased stride length;Trunk flexed Gait velocity: decreased      PT Goals Acute Rehab PT Goals PT Goal Formulation: With patient Time For Goal Achievement: 01/28/13 Potential to Achieve Goals: Good Pt will go Supine/Side to Sit: with modified independence Pt will go Sit to Supine/Side: with modified independence Pt will go Sit to Stand: with modified independence PT Goal: Sit to Stand - Progress: Progressing toward goal Pt will go Stand to Sit: with modified independence PT Goal: Stand to Sit - Progress: Progressing toward goal Pt will Ambulate: >150 feet;with modified independence PT Goal: Ambulate - Progress: Met  Visit Information  Last PT Received On: 01/22/13 Assistance Needed: +1    Subjective Data  Subjective: I'm gonna get a watch and see how long my battery lasts Patient Stated Goal: to go home   Cognition  Cognition Arousal/Alertness: Awake/alert Behavior During Therapy: Flat affect Overall Cognitive Status:  (apparnet STM deficits) Difficult to assess due to: Hard of hearing/deaf    Balance  Static Sitting Balance Static Sitting - Balance Support: Feet supported Static Sitting - Level of Assistance: 5: Stand by assistance Static Sitting - Comment/# of Minutes: 13 minutes; multiple VCs to follow steps indicated for cwitching to batteries Static Standing Balance Static Standing - Balance Support: Bilateral upper extremity supported Static Standing - Level of Assistance: 5: Stand by assistance Static Standing - Comment/# of Minutes: 3 min rest breaks  End of Session PT - End of Session Activity Tolerance: Patient tolerated treatment well Patient left: with call bell/phone within reach;in chair;with family/visitor present Nurse Communication: Mobility status   GP     Fabio Asa 01/22/2013, 12:48 PM Charlotte Crumb, PT DPT  [phone]

## 2013-01-22 NOTE — Progress Notes (Signed)
[id number] Education completed with pt and wife. Gave low sodium diet handouts. Pt agreed to referral to [facility] Phase 2. Myrah Strawderman DunlapRNBSN

## 2013-01-22 NOTE — Progress Notes (Signed)
CARDIAC REHAB PHASE I   PRE:  Rate/Rhythm: 98afib  BP:  Supine:   Sitting: dopplered 74/  Standing:    SaO2: 95%RA  MODE:  Ambulation: 150 ft   POST:  Rate/Rhythm: 81  BP:  Supine:   Sitting: 72/ dopplered  Standing:    SaO2: 93-94%RA whole walk. 1500-1520 Asked to walk pt and monitor sats. Pt walked 150 ft on RA with rollator and back up bag. Sats maintained 93-93 % whole walk. Pt stopped once to rest. Pt did not feel that he could go farther at this time. Pt did c/o slight SOB. To sitting on side of bed after walk.   Luetta Nutting, RN BSN  01/22/2013 3:22 PM

## 2013-01-22 NOTE — Progress Notes (Signed)
D/C CTS per protocol and as ordered, steris applied.  Will continue to monitor 

## 2013-01-22 NOTE — Progress Notes (Signed)
ANTICOAGULATION CONSULT NOTE   Pharmacy Consult for warfarin  Indication: atrial fibrillation and LVAD placed 4/15  No Known Allergies  Patient Measurements: Height: 6' (182.9 cm) Weight: 219 lb 2.2 oz (99.4 kg) IBW/kg (Calculated) : 77.6   Recent Labs  01/20/13 0445 01/21/13 0327 01/22/13 0545  HGB 8.3*  --  8.3*  HCT 26.8*  --  27.6*  PLT 209  --  229  LABPROT 24.3* 24.5* 22.9*  INR 2.30* 2.33* 2.13*  CREATININE 0.82  --  0.81    Estimated Creatinine Clearance: 102.1 ml/min (by C-G formula based on Cr of 0.81).  Medications:  PTA Warfarin 5mg  daily except 2.5mg  on Thursday  Assessment: 73 year old male now POD#10 from LVAD. Patient was on coumadin prior to admission for afib and was on heparin bridge prior to surgery. INR therapeutic today. No bleeding noted in chart except for complaints of bleeding hemorrhoids.  Hgb low but appears stable, platelet count rising.  Goal of Therapy:  INR 2-2.5 per Dr. Zenaida Niece Trigt's note Monitor platelets by anticoagulation protocol: Yes   Plan:  1) 7.5mg  x 1 tonight as trend down after 5d of  5mg , with better organ perfusion weekly warfarin requirement may be higher, plan to discharge on 5 mg daily 2) INR as outpatient early next week 3) Continue to monitor signs/symptoms of bleeding   Thank you for allowing pharmacy to be a part of this patients care team.  Lovenia Kim Pharm.D., BCPS Clinical Pharmacist 01/22/2013 8:50 AM Pager: [phone] Phone: [phone]

## 2013-01-22 NOTE — Progress Notes (Signed)
HeartMate 2 Rounding Note  Subjective:    [name] is a 72 year old gentleman with a history of CABG (2003), Chronic systolic HF EF 20-25% %, S/P AICD (generator change 2011), hyperlipidemia, hypothyroidism, emphysema, OSA, intermittent atrial fibrillation (on coumadin). Quit smoking 2003. Uses CPAP and O2 every night. He is a disabled [employer]. He was referred to the AHF clinic by Dr. Elease Hashimoto and over the past couple of months his functional capacity has really declined. He has class IV heart failure symptoms and has not improved on OMM. His CPX in 11/13 showed a VO2 of 9.2%, VE/VCO2- 52.8, OUES 1.15 and RER= 1.02. We have been following him closely and have had multiple discussions about his options and the patient decided that he wanted to proceed with LVAD placement. I discussed the patient with Dr Romona Curls at [facility] who agreed that the patient was an acceptable DT VAD candidate- his pulm hx of asbestosis, low DLCO are a problem for transplant candidacy  Admitted 01/10/13 for optimization before LVAD implant with acute on chronic heart failure.. EF by LV gram 20% 01/07/13  POD#10 Doing well overall. Struggling with depression. Celexa increased. Was having tremors and Reglan stopped. Ramp echo looked good yesterday. I/Os stable. Weight 220. Denies orthopnea or PND.   MAPs 70-80s. INR 2.33->P   LVAD INTERROGATION:  HeartMate II LVAD:  Flow 5.9 liters/min, speed 9200, power 6.0, PI 3.9  No PI events overnight  Objective:    Vital Signs:   Temp:  [98.2 F (36.8 C)-98.6 F (37 C)] 98.2 F (36.8 C) (04/25 0417) Pulse Rate:  [88-95] 90 (04/25 0417) Resp:  [16-18] 18 (04/24 1957) SpO2:  [92 %-95 %] 94 % (04/25 0417) Weight:  [100.2 kg (220 lb 14.4 oz)] 100.2 kg (220 lb 14.4 oz) (04/24 1047) Last BM Date: 01/21/13 Mean arterial Pressure 86  Intake/Output:   Intake/Output Summary (Last 24 hours) at 01/22/13 0448 Last data filed at 01/22/13 0420  Gross per 24 hour  Intake    240 ml  Output     225 ml  Net     15 ml     Physical Exam: General: NAD, Sitting in chair  HEENT: normal Neck: supple. JVD flat . Carotids -  no bruits. No lymphadenopathy or thryomegaly appreciated. Cor: Mechanical heart sounds with LVAD hum present Lungs: CTA. Abdomen: soft, nontender, distention. No hepatosplenomegaly. No bruits or masses. + bowel sounds. Driveline: Dressing C/D/I and securement device intact; skin pink where aquacel lays on skin for driveline dressing Extremities: no cyanosis, clubbing, rash, edema extremities warm and well perfused Neuro: alert & orientedx3, cranial nerves grossly intact. moves all 4 extremities w/o difficulty  Telemetry: Afib 93 bpm  Labs: Basic Metabolic Panel:  Recent Labs Lab 01/16/13 0355 01/17/13 0341 01/18/13 0330 01/19/13 0515 01/20/13 0445  NA 133* 130* 131* 132* 132*  K 3.6 3.8 3.8 4.4 4.4  CL 98 97 95* 97 98  CO2 29 30 29 27 25   GLUCOSE 115* 82 104* 99 142*  BUN 27* 24* 19 25* 30*  CREATININE 0.72 0.68 0.69 0.75 0.82  CALCIUM 9.2 9.2 9.7 10.0 10.0  MG 2.0 2.1 2.1  --   --     Liver Function Tests:  Recent Labs Lab 01/15/13 0830 01/17/13 0341 01/20/13 0445  AST 30 23 33  ALT 11 11 18   ALKPHOS 42 43 48  BILITOT 0.7 0.5 0.7  PROT 5.8* 5.9* 6.5  ALBUMIN 2.8* 2.7* 3.1*    Recent Labs Lab 01/15/13 0830  AMYLASE 16   No results found for this basename: AMMONIA,  in the last 168 hours  CBC:  Recent Labs Lab 01/15/13 1500 01/16/13 0355 01/17/13 0341 01/18/13 0330 01/20/13 0445  WBC 8.6 8.8 8.4 7.7 9.8  NEUTROABS  --  6.8 6.0 5.4  --   HGB 7.8* 7.8* 8.0* 8.4* 8.3*  HCT 23.8* 24.7* 25.4* 25.9* 26.8*  MCV 81.5 81.8 82.2 82.0 84.3  PLT 92* 109* 111* 148* 209    INR:  Recent Labs Lab 01/17/13 0341 01/18/13 0330 01/19/13 0515 01/20/13 0445 01/21/13 0327  INR 1.43 1.83* 2.33* 2.30* 2.33*    Other results:     Imaging: Dg Chest 2 View  01/20/2013  *RADIOLOGY REPORT*  Clinical Data: Ventricular assist  device  CHEST - 2 VIEW  Comparison: 01/18/2013  Findings: Left ventricular assist device noted.  Stable enlarged heart silhouette.  There is bibasilar atelectasis unchanged from prior.  Small effusions present.  No pulmonary edema.  The right PICC line noted.  IMPRESSION: 1.  No interval change. 2.  Bibasilar atelectasis and small effusions.   Original Report Authenticated By: Genevive Bi, M.D.    Ct Chest W Contrast  01/21/2013  *RADIOLOGY REPORT*  Clinical Data: Status post left ventricular assist device (LVAD) placement.  CT CHEST WITH CONTRAST  Technique:  Multidetector CT imaging of the chest was performed following the standard protocol during bolus administration of intravenous contrast.  Contrast: 80mL OMNIPAQUE IOHEXOL 300 MG/ML  SOLN  Comparison: Chest CT 01/05/2013.  Findings:  LVAD:  Compared to the prior examination there has been interval placement of a left ventricular assist device.  The inflow cannula enters the apex of the left ventricle, and appears mildly angulated (approximately 50 degrees off axis relative to the long axis of the left ventricle), with tip directed toward the apical septum. However, there is considerable distance between the tip of the inflow cannula and the adjacent septum such that no potential obstruction is identified at this time.  There is a small amount of eccentric nonenhancing material around the proximal aspect of the outflow cannula, which is favored to represent excluded space between the two portions of the cannula, rather than eccentric clot.  No definite filling defect is otherwise noted within the inflow or outflow cannulas to suggest significant thrombus at this time (portions of both cannulas are incompletely visualized secondary to beam-hardening artifact).  The apical anastomosis appears intact, as does the aortic anastomosis.  No surrounding pseudoaneurysm associated with either of the anastomoses.  The left ventricular assist device pocket in the  left upper quadrant of the abdomen is remarkable for a small amount of fluid and gas, which is likely within normal limits in this recent postoperative patient.  Mediastinum: Heart size is mildly enlarged. Small amount of pericardial fluid and/or thickening, unlikely to be of any hemodynamic significance at this time. There is atherosclerosis of the thoracic aorta, the great vessels of the mediastinum and the coronary arteries, including calcified atherosclerotic plaque in the the left main, left anterior descending, left circumflex and right coronary arteries. The patient is status post median sternotomy for CABG, including a LIMA to the LAD.  All of the bypass grafts (LIMA to LAD, SVG to OM distribution, and SVG to distal RCA distribution) appear grossly patent on this non gated CT examination.  Numerous borderline enlarged presumably reactive mediastinal lymph nodes are noted.  Esophagus is unremarkable in appearance. A right upper extremity PICC is present with tip terminating in the distal superior  vena cava.  Left-sided pacemaker device in place with single lead terminating in the right ventricular apex.  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  Trace left pleural effusion new compared to the prior examination.  7 mm nodule in the anterolateral aspect of the right lower lobe (image 44 of series 3) is unchanged.  8 mm nodule in the anterolateral aspect of the right upper lobe (image 28 of series 3) is also unchanged.  There is a very subtle pattern of peripheral subpleural reticulation and mild diffuse interstitial prominence, which could suggest early changes of and interstitial lung disease such as asbestosis.  Mild centrilobular and paraseptal emphysema is also noted.  Upper Abdomen: The liver has a slightly nodular and irregular contour, suggestive of early cirrhosis.  Musculoskeletal: Sternotomy wires. There are no aggressive  appearing lytic or blastic lesions noted in the visualized portions of the skeleton.  IMPRESSION: 1.  Interval placement of a LVAD, as above, which appears properly located without evidence of obstruction of either the inflow or outflow cannulas, or other acute complicating features. 2.  Trace left pleural effusion is new compared to the prior study. 3.  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 4.  Cirrhosis. 5.  Additional incidental findings, as above, similar to prior studies.   Original Report Authenticated By: Trudie Reed, M.D.      Medications:     Scheduled Medications: . aspirin EC  325 mg Oral Daily  . bisacodyl  10 mg Oral Daily  . budesonide-formoterol  2 puff Inhalation BID  . citalopram  40 mg Oral Daily  . docusate sodium  200 mg Oral Daily  . feeding supplement  237 mL Oral BID BM  . ferrous fumarate-b12-vitamic C-folic acid  1 capsule Oral TID PC  . furosemide  40 mg Oral BID  . hydrocortisone   Rectal TID  . insulin aspart  0-24 Units Subcutaneous TID AC & HS  . insulin glargine  12 Units Subcutaneous QHS  . levothyroxine  50 mcg Oral QHS  . metoprolol succinate  25 mg Oral Daily  . pantoprazole  40 mg Oral Daily  . potassium chloride  20 mEq Oral BID  . tiotropium  18 mcg Inhalation Daily  . Warfarin - Pharmacist Dosing Inpatient   Does not apply q1800    Infusions:    PRN Medications: acetaminophen, oxyCODONE, sodium chloride, traMADol   Assessment:   1) Acute on chronic HF - class IV  2) Chronic AF on coumadin  3) ICM, EF 20%        S/p LVAD Heartmate II implant 01/12/13 4) CAD  S/p CABG 2003  5) COPD with depressed DLCO  CPAP at night  6) HLD  7) Hypothyroidism  8) Expected blood loss anemia- LDH transiently increased prob due to banked blood breakdown but will follow for evidence of hemolysis. Continue oral iron and folate 9)  Depression  Plan/Discussion:    POD #10  Doing well clinically except struggling with depression. Functional status is good. Education complete. Hopefully can go home later today if INR stable.   I reviewed the LVAD parameters from today, and compared the results to the patient's prior recorded data. No programming changes were made. The LVAD is functioning within specified parameters. Patient performing LVAD self-test daily. LVAD interrogation was negative for any significant power changes, alarms, or PI events  or speed drops. LVAD equipment check completed and is in good working order. Backup equipment present. LVAD education done on emergency procedures and precautions and reviewed drive line exit site care.  [name] 01/22/2013, 4:48 AM Length of Stay: 8 4:48 AM

## 2013-01-22 NOTE — Progress Notes (Signed)
TCTS  HeartMate 2 Rounding Note postop day 10 status post redo sternotomy and HeartMate 2 implantation  Subjective:    [name] is a 72 year old gentleman with a history of CABG (2003), Chronic systolic HF EF 20-25% %, S/P AICD (generator change 2011), hyperlipidemia, hypothyroidism, emphysema, OSA, intermittent atrial fibrillation (on coumadin). Quit smoking 2003. Uses CPAP and O2 every night. He is a disabled [employer]. He was referred to the AHF clinic by Dr. Elease Hashimoto and over the past couple of months his functional capacity has really declined. He has class IV heart failure symptoms and has not improved on OMM. His CPX in 11/13 showed a VO2 of 9.2%, VE/VCO2- 52.8, OUES 1.15 and RER= 1.02. We have been following him closely and have had multiple discussions about his options and the patient decided that he wanted to proceed with LVAD placement. I discussed the patient with Dr Romona Curls at [facility] who agreed that the patient was an acceptable DT VAD candidate- his pulm hx of asbestosis, low DLCO are a problem for transplant candidacy  Admitted 01/10/13 for optimization before LVAD implant with acute on chronic heart failure.. EF by LV gram 20% 01/07/13  POD#10 Walked> 500 ft today- nausea resolved, needs O2 w/ ambulation and has home O2 already in place Inotropes all weaned off-last co- ox 68% Controlled afib Wt down 1 lbs now on po lasix Hemoglobin up to 8.4on oral iron and diuresis   Minimal PI events, , continue LVAD speed  at 9200 RPM Pump pocket drain removed, surgical incisions healing well. Nausea resolved, KUB reviewed Successful teaching completed about driveline and equipment predischarge testing is about complete Poss discharge today or tomorrow.  LVAD INTERROGATION:  HeartMate II LVAD:  Flow 5.2liters/min, speed 9200, power 65.1, PI 5.6  No PI events  Objective:    Vital Signs:   Temp:  [98.2 F (36.8 C)-98.7 F (37.1 C)] 98.7 F (37.1 C) (04/25 1044) Pulse Rate:  [83-95] 85  (04/25 1044) Resp:  [16-18] 18 (04/25 1044) SpO2:  [92 %-96 %] 95 % (04/25 1044) Weight:  [219 lb 2.2 oz (99.4 kg)] 219 lb 2.2 oz (99.4 kg) (04/25 0417) Last BM Date: 01/21/13 Mean arterial Pressure 70-80's  Intake/Output: Even   Intake/Output Summary (Last 24 hours) at 01/22/13 1444 Last data filed at 01/22/13 1230  Gross per 24 hour  Intake    600 ml  Output   1175 ml  Net   -575 ml     Physical Exam: General: NAD, Laying in bed HEENT: normal Neck: supple. CVP 8-9 . Carotids -  no bruits. No lymphadenopathy or thryomegaly appreciated. Cor: Mechanical heart sounds with LVAD hum present.-  Lungs: diminished in the bases Abdomen: soft, nontender, no distention. No hepatosplenomegaly. No bruits or masses. no bowel sounds. Driveline: Dressing C/D/I and securement device intact Extremities: no cyanosis, clubbing, rash, edema extremities warm and well perfused Neuro: alert & orientedx3, cranial nerves grossly intact. moves all 4 extremities w/o difficulty Sternal incision clean and dry   Telemetry: Afib 95 bpm  Labs: Basic Metabolic Panel:  Recent Labs Lab 01/16/13 0355 01/17/13 0341 01/18/13 0330 01/19/13 0515 01/20/13 0445 01/22/13 0545  NA 133* 130* 131* 132* 132* 133*  K 3.6 3.8 3.8 4.4 4.4 4.1  CL 98 97 95* 97 98 98  CO2 29 30 29 27 25 27   GLUCOSE 115* 82 104* 99 142* 98  BUN 27* 24* 19 25* 30* 27*  CREATININE 0.72 0.68 0.69 0.75 0.82 0.81  CALCIUM 9.2 9.2 9.7  10.0 10.0 9.8  MG 2.0 2.1 2.1  --   --   --     Liver Function Tests:  Recent Labs Lab 01/17/13 0341 01/20/13 0445  AST 23 33  ALT 11 18  ALKPHOS 43 48  BILITOT 0.5 0.7  PROT 5.9* 6.5  ALBUMIN 2.7* 3.1*   No results found for this basename: LIPASE, AMYLASE,  in the last 168 hours No results found for this basename: AMMONIA,  in the last 168 hours  CBC:  Recent Labs Lab 01/16/13 0355 01/17/13 0341 01/18/13 0330 01/20/13 0445 01/22/13 0545  WBC 8.8 8.4 7.7 9.8 10.6*  NEUTROABS 6.8  6.0 5.4  --   --   HGB 7.8* 8.0* 8.4* 8.3* 8.3*  HCT 24.7* 25.4* 25.9* 26.8* 27.6*  MCV 81.8 82.2 82.0 84.3 86.0  PLT 109* 111* 148* 209 229    INR:  Recent Labs Lab 01/18/13 0330 01/19/13 0515 01/20/13 0445 01/21/13 0327 01/22/13 0545  INR 1.83* 2.33* 2.30* 2.33* 2.13*    Other results:  EKG:   Imaging: Ct Chest W Contrast  01/21/2013  *RADIOLOGY REPORT*  Clinical Data: Status post left ventricular assist device (LVAD) placement.  CT CHEST WITH CONTRAST  Technique:  Multidetector CT imaging of the chest was performed following the standard protocol during bolus administration of intravenous contrast.  Contrast: 80mL OMNIPAQUE IOHEXOL 300 MG/ML  SOLN  Comparison: Chest CT 01/05/2013.  Findings:  LVAD:  Compared to the prior examination there has been interval placement of a left ventricular assist device.  The inflow cannula enters the apex of the left ventricle, and appears mildly angulated (approximately 50 degrees off axis relative to the long axis of the left ventricle), with tip directed toward the apical septum. However, there is considerable distance between the tip of the inflow cannula and the adjacent septum such that no potential obstruction is identified at this time.  There is a small amount of eccentric nonenhancing material around the proximal aspect of the outflow cannula, which is favored to represent excluded space between the two portions of the cannula, rather than eccentric clot.  No definite filling defect is otherwise noted within the inflow or outflow cannulas to suggest significant thrombus at this time (portions of both cannulas are incompletely visualized secondary to beam-hardening artifact).  The apical anastomosis appears intact, as does the aortic anastomosis.  No surrounding pseudoaneurysm associated with either of the anastomoses.  The left ventricular assist device pocket in the left upper quadrant of the abdomen is remarkable for a small amount of fluid and  gas, which is likely within normal limits in this recent postoperative patient.  Mediastinum: Heart size is mildly enlarged. Small amount of pericardial fluid and/or thickening, unlikely to be of any hemodynamic significance at this time. There is atherosclerosis of the thoracic aorta, the great vessels of the mediastinum and the coronary arteries, including calcified atherosclerotic plaque in the the left main, left anterior descending, left circumflex and right coronary arteries. The patient is status post median sternotomy for CABG, including a LIMA to the LAD.  All of the bypass grafts (LIMA to LAD, SVG to OM distribution, and SVG to distal RCA distribution) appear grossly patent on this non gated CT examination.  Numerous borderline enlarged presumably reactive mediastinal lymph nodes are noted.  Esophagus is unremarkable in appearance. A right upper extremity PICC is present with tip terminating in the distal superior vena cava.  Left-sided pacemaker device in place with single lead terminating in the right ventricular  apex.  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  Trace left pleural effusion new compared to the prior examination.  7 mm nodule in the anterolateral aspect of the right lower lobe (image 44 of series 3) is unchanged.  8 mm nodule in the anterolateral aspect of the right upper lobe (image 28 of series 3) is also unchanged.  There is a very subtle pattern of peripheral subpleural reticulation and mild diffuse interstitial prominence, which could suggest early changes of and interstitial lung disease such as asbestosis.  Mild centrilobular and paraseptal emphysema is also noted.  Upper Abdomen: The liver has a slightly nodular and irregular contour, suggestive of early cirrhosis.  Musculoskeletal: Sternotomy wires. There are no aggressive appearing lytic or blastic lesions noted in the visualized portions of the  skeleton.  IMPRESSION: 1.  Interval placement of a LVAD, as above, which appears properly located without evidence of obstruction of either the inflow or outflow cannulas, or other acute complicating features. 2.  Trace left pleural effusion is new compared to the prior study. 3.  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 4.  Cirrhosis. 5.  Additional incidental findings, as above, similar to prior studies.   Original Report Authenticated By: Trudie Reed, M.D.      Medications:     Scheduled Medications: . aspirin EC  325 mg Oral Daily  . bisacodyl  10 mg Oral Daily  . budesonide-formoterol  2 puff Inhalation BID  . citalopram  40 mg Oral Daily  . docusate sodium  200 mg Oral Daily  . feeding supplement  237 mL Oral BID BM  . ferrous fumarate-b12-vitamic C-folic acid  1 capsule Oral TID PC  . furosemide  40 mg Oral BID  . hydrocortisone   Rectal TID  . insulin aspart  0-24 Units Subcutaneous TID AC & HS  . insulin glargine  12 Units Subcutaneous QHS  . levothyroxine  50 mcg Oral QHS  . metoprolol succinate  25 mg Oral Daily  . pantoprazole  40 mg Oral Daily  . potassium chloride  20 mEq Oral BID  . tiotropium  18 mcg Inhalation Daily  . Warfarin - Pharmacist Dosing Inpatient   Does not apply q1800    Infusions:   none, PICC line will be removed prior to discharge  PRN Medications: acetaminophen, oxyCODONE, sodium chloride, traMADol   Assessment:   1) Acute on chronic HF - class IV  2) Chronic AF on coumadin  3) ICM, EF 20%        S/p LVAD Heartmate II implant 01/12/13 4) CAD  S/p CABG 2003  5) COPD with depressed DLCO  CPAP at night  6) HLD  7) Hypothyroidism  8) Expected blood loss anemia- hemoglobin improving on oral iron and folate, last hemoglobin 8.3 Plan/Discussion:    POD #10  Plan Anticoagulation reached goal 2-2.5 Much stronger now walking tol beta  blocker for afib controll Echo ramp study completed- cont current speed 9200 RPM Chest CT shows no problem with VAD anatomy or position of cannulae Length of Stay: 12   I reviewed the LVAD parameters from today, and compared the results to the patient's prior recorded data. No programming changes were made. The LVAD is functioning within specified parameters. Nursing performing LVAD self-test daily. LVAD interrogation was negative for any significant power changes, alarms, or PI events or speed drops.  LVAD equipment check completed and is in good working order. Backup equipment present. LVAD education done on emergency procedures and precautions and reviewed drive line exit site care.   VAN TRIGT III,PETER 01/22/2013, 2:44 PM  patient examined and medical record reviewed,agree with above note. VAN TRIGT III,PETER 01/22/2013

## 2013-01-26 ENCOUNTER — Telehealth: Payer: Self-pay | Admitting: *Deleted

## 2013-01-26 ENCOUNTER — Ambulatory Visit (HOSPITAL_COMMUNITY): Payer: Self-pay | Admitting: Anesthesiology

## 2013-01-26 NOTE — Patient Instructions (Signed)
Refer to anticoag flowsheet.

## 2013-01-26 NOTE — Telephone Encounter (Signed)
TCM patient. Doing well. No current problems. Aware of appointment with heart failure clinic tomorrow. Gave him code to parking garage.

## 2013-01-27 ENCOUNTER — Ambulatory Visit (HOSPITAL_COMMUNITY): Payer: Self-pay | Admitting: Anesthesiology

## 2013-01-27 ENCOUNTER — Ambulatory Visit (HOSPITAL_COMMUNITY)
Admit: 2013-01-27 | Discharge: 2013-01-27 | Disposition: A | Discharge status: Home / Self Care | Payer: Medicare Other | Attending: Internal Medicine | Admitting: Internal Medicine

## 2013-01-27 VITALS — BP 68/1 | Wt 231.2 lb

## 2013-01-27 DIAGNOSIS — J438 Other emphysema: Secondary | ICD-10-CM | POA: Insufficient documentation

## 2013-01-27 DIAGNOSIS — Z9861 Coronary angioplasty status: Secondary | ICD-10-CM | POA: Insufficient documentation

## 2013-01-27 DIAGNOSIS — G4733 Obstructive sleep apnea (adult) (pediatric): Secondary | ICD-10-CM | POA: Insufficient documentation

## 2013-01-27 DIAGNOSIS — E039 Hypothyroidism, unspecified: Secondary | ICD-10-CM | POA: Insufficient documentation

## 2013-01-27 DIAGNOSIS — I2589 Other forms of chronic ischemic heart disease: Secondary | ICD-10-CM | POA: Insufficient documentation

## 2013-01-27 DIAGNOSIS — I4891 Unspecified atrial fibrillation: Secondary | ICD-10-CM | POA: Insufficient documentation

## 2013-01-27 DIAGNOSIS — J61 Pneumoconiosis due to asbestos and other mineral fibers: Secondary | ICD-10-CM | POA: Insufficient documentation

## 2013-01-27 DIAGNOSIS — Z79899 Other long term (current) drug therapy: Secondary | ICD-10-CM | POA: Insufficient documentation

## 2013-01-27 DIAGNOSIS — I5022 Chronic systolic (congestive) heart failure: Secondary | ICD-10-CM | POA: Insufficient documentation

## 2013-01-27 DIAGNOSIS — Z95810 Presence of automatic (implantable) cardiac defibrillator: Secondary | ICD-10-CM | POA: Insufficient documentation

## 2013-01-27 DIAGNOSIS — Z95811 Presence of heart assist device: Secondary | ICD-10-CM

## 2013-01-27 DIAGNOSIS — Z7901 Long term (current) use of anticoagulants: Secondary | ICD-10-CM | POA: Insufficient documentation

## 2013-01-27 DIAGNOSIS — Z87891 Personal history of nicotine dependence: Secondary | ICD-10-CM | POA: Insufficient documentation

## 2013-01-27 DIAGNOSIS — Z951 Presence of aortocoronary bypass graft: Secondary | ICD-10-CM | POA: Insufficient documentation

## 2013-01-27 DIAGNOSIS — Z95818 Presence of other cardiac implants and grafts: Secondary | ICD-10-CM

## 2013-01-27 DIAGNOSIS — E785 Hyperlipidemia, unspecified: Secondary | ICD-10-CM | POA: Insufficient documentation

## 2013-01-27 LAB — BASIC METABOLIC PANEL
CO2: 25 mEq/L (ref 19–32)
Calcium: 9.5 mg/dL (ref 8.4–10.5)
GFR calc Af Amer: >90 mL/min (ref >90)
Sodium: 135 mEq/L (ref 135–145)

## 2013-01-27 LAB — CBC
MCV: 87.9 fL (ref 78.0–100.0)
Platelets: 245 10*3/uL (ref 150–400)
RBC: 3.4 MIL/uL — ABNORMAL LOW (ref 4.22–5.81)
RDW: 20.4 % — ABNORMAL HIGH (ref 11.5–15.5)
WBC: 11.2 10*3/uL — ABNORMAL HIGH (ref 4.0–10.5)

## 2013-01-27 LAB — PROTIME-INR
INR: 2.03 — ABNORMAL HIGH (ref 0.00–1.49)
Prothrombin Time: 22.1 seconds — ABNORMAL HIGH (ref 11.6–15.2)

## 2013-01-27 LAB — LACTATE DEHYDROGENASE: LDH: 304 U/L — ABNORMAL HIGH (ref 94–250)

## 2013-01-27 NOTE — Assessment & Plan Note (Addendum)
S/P HM II implant 01/12/13. Doing great and getting stamina back slowly. Removed one suture from driveline insertion. Minimal serous drainage and erythema skin wear Aquacel lays. Will stop Aquacel to see if erythema improves, if continued drainage next week will culture.   Attending: Agree. Watch site closely for developing infection. Counseled to minimize driveline trauma. Low threshold to start antibiotics. Ramp echo next week.

## 2013-01-27 NOTE — Progress Notes (Signed)
Patient ID: [name], male   DOB: 05-29-1941, 72 y.o.   MRN: [id number] HPI: [name] is a 71 year old gentleman with a history of CABG (2003), Chronic systolic HF EF 20-25% %, S/P AICD (generator change 2011), hyperlipidemia, hypothyroidism, emphysema, OSA, intermittent atrial fibrillation (on coumadin). Quit smoking 2003. Uses CPAP and O2 every night. He is a disabled [employer]. He was referred to the AHF clinic by Dr. Elease Wood and over the past couple of months his functional capacity has really declined. He has class IV heart failure symptoms and has not improved on OMM. His CPX in 11/13 showed a VO2 of 9.2%, VE/VCO2- 52.8, OUES 1.15 and RER= 1.02. We have been following him closely and have had multiple discussions about his options and the patient decided that he wanted to proceed with LVAD placement. I discussed the patient with Dr [name] at [facility] who agreed that the patient was an acceptable DT VAD candidate- his pulm hx of asbestosis, low DLCO are a problem for transplant candidacy  LVAD HM II implanted 01/12/13  Follow up: Patient reports he is doing well at home and says that he feels 75% better than before surgery. Walking longer distances than before surgery, but still some SOB r/t COPD. Working with home health PT. Taking medications as prescribed. Denies dizziness, CP, or orthopnea. Weight at home increasing since d/c up 10 lbs.  Denies LVAD alarms.  Denies driveline trauma, erythema or drainage.  Denies ICD shocks.   Reports taking Coumadin as prescribed and adherence to anticoagulation based dietary restrictions.  Denies bright red blood per rectum or melena, no dark urine or hematuria.    Past Medical History  Diagnosis Date  . Ischemic cardiomyopathy      CABG 2003, PCI 2007  EF 27%(myoview 2012  . CHF (congestive heart failure)     class I-II  . Sleep apnea     ?  [name] Intestine disorder     blockage  . ICD (implantable cardiac defibrillator) in place   . Hyperlipidemia   .  Hypothyroidism   . Chronic anticoagulation   . Obesity   . COPD (chronic obstructive pulmonary disease)   . Asbestosis   . Atrial fibrillation     paroxysmal; on coumadin  . Paroxysmal ventricular tachycardia     has ICD in place  . Coronary artery disease     Current Outpatient Prescriptions  Medication Sig Dispense Refill  . albuterol (PROVENTIL) (2.5 MG/3ML) 0.083% nebulizer solution Take 2.5 mg by nebulization every 6 (six) hours as needed.       [name] aspirin 81 MG tablet Take 81 mg by mouth daily.      . budesonide-formoterol (SYMBICORT) 160-4.5 MCG/ACT inhaler Inhale 2 puffs into the lungs 2 (two) times daily.      . citalopram (CELEXA) 40 MG tablet Take 1 tablet (40 mg total) by mouth daily.  30 tablet  3  . docusate sodium (COLACE) 100 MG capsule Take 200 mg by mouth at bedtime.       . ferrous fumarate-b12-vitamic C-folic acid (TRINSICON / FOLTRIN) capsule Take 1 capsule by mouth 3 (three) times daily after meals.  90 capsule  3  . furosemide (LASIX) 40 MG tablet Take 1 tablet (40 mg total) by mouth 2 (two) times daily.  60 tablet  3  . levothyroxine (SYNTHROID, LEVOTHROID) 100 MCG tablet Take 50 mcg by mouth at bedtime.       [name] lisinopril (PRINIVIL,ZESTRIL) 40 MG tablet Take 20 mg by mouth  at bedtime.       . metoprolol succinate (TOPROL-XL) 25 MG 24 hr tablet Take 1 tablet (25 mg total) by mouth daily.  30 tablet  3  . nitroGLYCERIN (NITROSTAT) 0.4 MG SL tablet Place 0.4 mg under the tongue every 5 (five) minutes as needed.        . pantoprazole (PROTONIX) 40 MG tablet Take 1 tablet (40 mg total) by mouth daily.  30 tablet  3  . potassium chloride SA (K-DUR,KLOR-CON) 20 MEQ tablet Take 1 tablet (20 mEq total) by mouth 2 (two) times daily.  60 tablet  3  . simvastatin (ZOCOR) 80 MG tablet Take 40 mg by mouth at bedtime.        [name] tiotropium (SPIRIVA) 18 MCG inhalation capsule Place 18 mcg into inhaler and inhale daily.       . traMADol (ULTRAM) 50 MG tablet Take 1 tablet (50 mg  total) by mouth every 6 (six) hours as needed.  30 tablet  3  . warfarin (COUMADIN) 5 MG tablet Take 1 tablet (5 mg total) by mouth daily.  45 tablet  3   No current facility-administered medications for this encounter.    Review of patient's allergies indicates no known allergies.  REVIEW OF SYSTEMS: All systems negative except as listed in HPI, PMH and Problem list.   LVAD INTERROGATION:   HeartMate II LVAD:  Flow 4.2 liters/min, speed 9200, power 4.8, PI 4.6.  Back up speed set to 8600.  I reviewed the LVAD parameters from today, and compared the results to the patient's prior recorded data.  No programming changes were made.  The LVAD is functioning within specified parameters.  The patient performs LVAD self-test daily.  LVAD interrogation was negative for any significant power changes, alarms or PI events/speed drops.  LVAD equipment check completed and is in good working order.  Back-up equipment present.   LVAD education done on emergency procedures and precautions and reviewed exit site care.   Physical Exam: Filed Vitals:   01/27/13 1332  BP: 68/1  Weight: 231 lb 4 oz (104.894 kg)  SpO2: 92%    GENERAL: Well appearing, male who presents to clinic today in NAD. HEENT: normal  NECK: Supple, JVP 8-9;  2+ bilaterally, no bruits.  No lymphadenopathy or thyromegaly appreciated.   CARDIAC: LVAD hum present.  LUNGS:  Clear to auscultation bilaterally.  ABDOMEN:  Soft, round, nontender, positive bowel sounds x4.     LVAD exit site: semi incorporated.  Dressing dry and intact.  Erythema where Aquacel lays on skin. Minimal serous drainage.  Stabilization device present and accurately applied.  Driveline dressing is being changed daily per sterile technique. Changed in clinic EXTREMITIES:  Warm and dry, no cyanosis, clubbing, rash or 1+ edema  NEUROLOGIC:  Alert and oriented x 4.  Gait steady.  No aphasia.  No dysarthria.  Affect pleasant.     EKG: Afib 79 bpm   ASSESSMENT AND  PLAN:   1. Status post LVAD implantation - Patient comes to clinic today for routine follow up visit. Currently has NYHA class III symptoms on LVAD support.   2.  Anticoagulation management - INR goal 2.0-3.0.  Will evaluate INR value today and follow up with patient for necessary changes.

## 2013-01-27 NOTE — Assessment & Plan Note (Signed)
Chronic Afib, rate controlled on coumadin.

## 2013-01-27 NOTE — Patient Instructions (Addendum)
Stop taking lisinopril.  If weight continues to go up give [place] a call.  Follow up 1 week for ECHO and appointment.  Doing great, keep up the good work!!!!  The glass is always half full :)

## 2013-01-27 NOTE — Assessment & Plan Note (Addendum)
NYHA III on VAD support. Volume status slightly elevated will give an extra dose of lasix today. MAP soft 66-68, will stop lisinopril and continue to monitor. Reports he feels 75% better than before VAD. Still SOB with exertion, but much improved and participating in PT with HH. Multiple PI events on interrogation, will do RAMP ECHO next week with visit.  Patient seen and examined with Ulla Potash, NP. We discussed all aspects of the encounter. I agree with the assessment and plan as stated above.  Feels very good. Volume status mildly elevated and MAP low. Agree with changes as above. VAD interrogated personally. Continues with multiple PI events but no alarms Will make changes as above and perform ramp echo next week.

## 2013-01-27 NOTE — Patient Instructions (Signed)
Refer to anticoag flowsheet.

## 2013-02-02 DIAGNOSIS — I509 Heart failure, unspecified: Secondary | ICD-10-CM

## 2013-02-03 ENCOUNTER — Ambulatory Visit (HOSPITAL_COMMUNITY)
Admission: RE | Admit: 2013-02-03 | Discharge: 2013-02-03 | Disposition: A | Discharge status: Home / Self Care | Payer: Medicare Other | Source: Ambulatory Visit | Attending: Internal Medicine | Admitting: Internal Medicine

## 2013-02-03 ENCOUNTER — Ambulatory Visit (HOSPITAL_COMMUNITY): Payer: Self-pay | Admitting: Anesthesiology

## 2013-02-03 ENCOUNTER — Encounter (HOSPITAL_COMMUNITY): Payer: Self-pay | Admitting: Internal Medicine

## 2013-02-03 ENCOUNTER — Ambulatory Visit (HOSPITAL_COMMUNITY)
Admission: RE | Admit: 2013-02-03 | Discharge: 2013-02-03 | Disposition: A | Discharge status: Home / Self Care | Payer: Medicare Other | Source: Ambulatory Visit | Attending: Anesthesiology | Admitting: Anesthesiology

## 2013-02-03 VITALS — BP 106/1 | HR 98 | Wt 226.2 lb

## 2013-02-03 DIAGNOSIS — Z87891 Personal history of nicotine dependence: Secondary | ICD-10-CM | POA: Insufficient documentation

## 2013-02-03 DIAGNOSIS — Z95810 Presence of automatic (implantable) cardiac defibrillator: Secondary | ICD-10-CM | POA: Insufficient documentation

## 2013-02-03 DIAGNOSIS — I059 Rheumatic mitral valve disease, unspecified: Secondary | ICD-10-CM

## 2013-02-03 DIAGNOSIS — G4733 Obstructive sleep apnea (adult) (pediatric): Secondary | ICD-10-CM

## 2013-02-03 DIAGNOSIS — E039 Hypothyroidism, unspecified: Secondary | ICD-10-CM | POA: Insufficient documentation

## 2013-02-03 DIAGNOSIS — I5022 Chronic systolic (congestive) heart failure: Secondary | ICD-10-CM

## 2013-02-03 DIAGNOSIS — J4489 Other specified chronic obstructive pulmonary disease: Secondary | ICD-10-CM | POA: Insufficient documentation

## 2013-02-03 DIAGNOSIS — E785 Hyperlipidemia, unspecified: Secondary | ICD-10-CM | POA: Insufficient documentation

## 2013-02-03 DIAGNOSIS — Z95811 Presence of heart assist device: Secondary | ICD-10-CM

## 2013-02-03 DIAGNOSIS — R0600 Dyspnea, unspecified: Secondary | ICD-10-CM

## 2013-02-03 DIAGNOSIS — E669 Obesity, unspecified: Secondary | ICD-10-CM | POA: Insufficient documentation

## 2013-02-03 DIAGNOSIS — R0602 Shortness of breath: Secondary | ICD-10-CM

## 2013-02-03 DIAGNOSIS — Z7901 Long term (current) use of anticoagulants: Secondary | ICD-10-CM | POA: Insufficient documentation

## 2013-02-03 DIAGNOSIS — I2510 Atherosclerotic heart disease of native coronary artery without angina pectoris: Secondary | ICD-10-CM | POA: Insufficient documentation

## 2013-02-03 DIAGNOSIS — I4891 Unspecified atrial fibrillation: Secondary | ICD-10-CM | POA: Insufficient documentation

## 2013-02-03 DIAGNOSIS — Z95818 Presence of other cardiac implants and grafts: Secondary | ICD-10-CM

## 2013-02-03 DIAGNOSIS — J449 Chronic obstructive pulmonary disease, unspecified: Secondary | ICD-10-CM | POA: Insufficient documentation

## 2013-02-03 DIAGNOSIS — J438 Other emphysema: Secondary | ICD-10-CM | POA: Insufficient documentation

## 2013-02-03 DIAGNOSIS — Z951 Presence of aortocoronary bypass graft: Secondary | ICD-10-CM | POA: Insufficient documentation

## 2013-02-03 LAB — CBC
Platelets: 193 10*3/uL (ref 150–400)
RBC: 3.46 MIL/uL — ABNORMAL LOW (ref 4.22–5.81)
RDW: 23.5 % — ABNORMAL HIGH (ref 11.5–15.5)
WBC: 8.5 10*3/uL (ref 4.0–10.5)

## 2013-02-03 LAB — PROTIME-INR: INR: 1.94 — ABNORMAL HIGH (ref 0.00–1.49)

## 2013-02-03 LAB — BASIC METABOLIC PANEL
Calcium: 9.8 mg/dL (ref 8.4–10.5)
Creatinine, Ser: 0.83 mg/dL (ref 0.50–1.35)
GFR calc Af Amer: >90 mL/min (ref >90)
GFR calc non Af Amer: 86 mL/min — ABNORMAL LOW (ref >90)
Sodium: 132 mEq/L — ABNORMAL LOW (ref 135–145)

## 2013-02-03 LAB — LACTATE DEHYDROGENASE: LDH: 271 U/L — ABNORMAL HIGH (ref 94–250)

## 2013-02-03 MED ORDER — WARFARIN SODIUM 5 MG PO TABS: 5.0000 mg | ORAL_TABLET | Freq: Every day | ORAL | Status: DC

## 2013-02-03 MED ORDER — LOSARTAN POTASSIUM 25 MG PO TABS: 25.0000 mg | ORAL_TABLET | Freq: Every day | ORAL | Status: DC

## 2013-02-03 MED ORDER — LISINOPRIL 40 MG PO TABS: 20.0000 mg | ORAL_TABLET | Freq: Every day | ORAL | Status: DC

## 2013-02-03 NOTE — Patient Instructions (Addendum)
Start losartan 25 mg daily. If notice increased fatigue or dizziness stop.  Take an extra lasix if weight about 227 lbs at home.  Continue to work with physical therapy on walking.  Will call with lab results.  Follow up 1 week.  Keep up the good work :)

## 2013-02-03 NOTE — Patient Instructions (Signed)
Refer to anticoag flowsheet.

## 2013-02-03 NOTE — Progress Notes (Unsigned)
Patient ID: ABUBAKAR CRISPO, male   DOB: 1941/03/02, 72 y.o.   MRN: [id number]   Speed        Flow  PI  Power        LVIDD  AI    AOV        MR          TR       Septum          RV  9000            4.9           5.9         5.2          4.0cm        No          Closed       Mild         Trivial     Midline          Good     9200            5.0           5.5         5.5          3.8 cm     Trivial      Closed       Mild         Trivial     Midline          Good  9400            5.0           4.9         5.4          4.1cm        No          Closed       No           Trivial     Pull L            Good   Ramp ECHO performed at bedside. Left patient's set speed at 9200 and set increased back up speed from 8400 to 8600.  If PI events recur can decrease speed to 9000.  Trisa Cranor 10:36 AM

## 2013-02-03 NOTE — Progress Notes (Signed)
  Echocardiogram 2D Echocardiogram (RAMP study) has been performed.  Jorje Guild 02/03/2013, 10:48 AM

## 2013-02-03 NOTE — Assessment & Plan Note (Addendum)
Patient continues to improve. NYHA III on VAD support. Volume status good. MAP elevated 106, lisinopril discontinued last visit will restart. Instructed patient if he notices increased dizziness or fatigue to discontinue. Doing well and pleased with outcomes. Working with [facility] PT. RAMP Echo performed today and left speed at 9200 with back-up 8600. Less PI events since last week. Will check labs and follow up 1 week.

## 2013-02-03 NOTE — Assessment & Plan Note (Signed)
Continue to wear CPAP at night.

## 2013-02-03 NOTE — Progress Notes (Signed)
Patient ID: [name], male   DOB: [birth year], 72 y.o.   MRN: [id number] HPI: [name] is a 72 year old gentleman with a history of CABG (2003), Chronic systolic HF EF 20-25% %, S/P AICD (generator change 2011), hyperlipidemia, hypothyroidism, emphysema, OSA, intermittent atrial fibrillation (on coumadin). Quit smoking 2003. Uses CPAP and O2 every night. He is a disabled [employer]. He was referred to the AHF clinic by Dr. Elease Hashimoto and over the past couple of months his functional capacity has really declined. He has class IV heart failure symptoms and has not improved on OMM. His CPX in 11/13 showed a VO2 of 9.2%, VE/VCO2- 52.8, OUES 1.15 and RER= 1.02. We have been following him closely and have had multiple discussions about his options and the patient decided that he wanted to proceed with LVAD placement. I discussed the patient with Dr Romona Curls at [facility] who agreed that the patient was an acceptable DT VAD candidate- his pulm hx of asbestosis, low DLCO are a problem for transplant candidacy  LVAD HM II implanted 01/12/13  Follow up: Patient continues to improve. Reports that he does not think he would be here if it wasn't for the operation. Working with [facility] PT and walking to the mailbox BID. Still complains of fatigue and SOB with activity, but better than before surgery. Taking medications as prescribed. Denies dizziness, orthopnea, or edema. Weight at home 223-230.   Denies LVAD alarms.  Denies driveline trauma, erythema or drainage.  Denies ICD shocks.   Reports taking Coumadin as prescribed and adherence to anticoagulation based dietary restrictions.  Denies bright red blood per rectum or melena, no dark urine or hematuria.    Past Medical History  Diagnosis Date  . Ischemic cardiomyopathy      CABG 2003, PCI 2007  EF 27%(myoview 2012  . CHF (congestive heart failure)     class I-II  . Sleep apnea     ?  [name] Intestine disorder     blockage  . ICD (implantable cardiac defibrillator) in place    . Hyperlipidemia   . Hypothyroidism   . Chronic anticoagulation   . Obesity   . COPD (chronic obstructive pulmonary disease)   . Asbestosis   . Atrial fibrillation     paroxysmal; on coumadin  . Paroxysmal ventricular tachycardia     has ICD in place  . Coronary artery disease     Current Outpatient Prescriptions  Medication Sig Dispense Refill  . albuterol (PROVENTIL) (2.5 MG/3ML) 0.083% nebulizer solution Take 2.5 mg by nebulization every 6 (six) hours as needed.       [name] aspirin 81 MG tablet Take 81 mg by mouth daily.      . budesonide-formoterol (SYMBICORT) 160-4.5 MCG/ACT inhaler Inhale 2 puffs into the lungs 2 (two) times daily.      . citalopram (CELEXA) 40 MG tablet Take 1 tablet (40 mg total) by mouth daily.  30 tablet  3  . docusate sodium (COLACE) 100 MG capsule Take 200 mg by mouth at bedtime.       . ferrous fumarate-b12-vitamic C-folic acid (TRINSICON / FOLTRIN) capsule Take 1 capsule by mouth 3 (three) times daily after meals.  90 capsule  3  . furosemide (LASIX) 40 MG tablet Take 1 tablet (40 mg total) by mouth 2 (two) times daily.  60 tablet  3  . levothyroxine (SYNTHROID, LEVOTHROID) 100 MCG tablet Take 50 mcg by mouth at bedtime.       . metoprolol succinate (TOPROL-XL) 25 MG 24  hr tablet Take 1 tablet (25 mg total) by mouth daily.  30 tablet  3  . nitroGLYCERIN (NITROSTAT) 0.4 MG SL tablet Place 0.4 mg under the tongue every 5 (five) minutes as needed.        . pantoprazole (PROTONIX) 40 MG tablet Take 1 tablet (40 mg total) by mouth daily.  30 tablet  3  . potassium chloride SA (K-DUR,KLOR-CON) 20 MEQ tablet Take 1 tablet (20 mEq total) by mouth 2 (two) times daily.  60 tablet  3  . simvastatin (ZOCOR) 80 MG tablet Take 40 mg by mouth at bedtime.        [name] tiotropium (SPIRIVA) 18 MCG inhalation capsule Place 18 mcg into inhaler and inhale daily.       . traMADol (ULTRAM) 50 MG tablet Take 1 tablet (50 mg total) by mouth every 6 (six) hours as needed.  30 tablet  3   . warfarin (COUMADIN) 5 MG tablet Take 1 tablet (5 mg total) by mouth daily.  45 tablet  3   No current facility-administered medications for this encounter.    Review of patient's allergies indicates no known allergies.  REVIEW OF SYSTEMS: All systems negative except as listed in HPI, PMH and Problem list.   LVAD INTERROGATION:   HeartMate II LVAD:  Flow 4.5 liters/min, speed 9200, power 5.1, PI 4.9.  Back up speed set to 8600.  I reviewed the LVAD parameters from today, and compared the results to the patient's prior recorded data.  No programming changes were made.  The LVAD is functioning within specified parameters.  The patient performs LVAD self-test daily.  LVAD interrogation was negative for any significant power changes, alarms or PI events/speed drops.  LVAD equipment check completed and is in good working order.  Back-up equipment present.   LVAD education done on emergency procedures and precautions and reviewed exit site care.   Physical Exam: Filed Vitals:   02/03/13 1046  BP: 106/1  Pulse: 98  Weight: 226 lb 4 oz (102.626 kg)  SpO2: 99%  Last weight: 231  GENERAL: Well appearing, male who presents to clinic today in NAD. HEENT: normal  NECK: Supple, JVP 7-8;  2+ bilaterally, no bruits.  No lymphadenopathy or thyromegaly appreciated.   CARDIAC: LVAD hum present.  LUNGS:  Clear to auscultation bilaterally.  ABDOMEN:  Soft, round, nontender, positive bowel sounds x4.     LVAD exit site: semi incorporated.  Dressing dry and intact.  Erythema where Aquacel lays on skin. Minimal serous drainage.  Stabilization device present and accurately applied.  Driveline dressing is being changed daily per sterile technique. Changed in clinic EXTREMITIES:  Warm and dry, no cyanosis, clubbing, rash or edema  NEUROLOGIC:  Alert and oriented x 4.  Gait steady.  No aphasia.  No dysarthria.  Affect pleasant.       ASSESSMENT AND PLAN:   1. Status post LVAD implantation - Patient  comes to clinic today for routine follow up visit. Currently has NYHA class III symptoms on LVAD support.   2.  Anticoagulation management - INR goal 2.0-3.0.  Will evaluate INR value today and follow up with patient for necessary changes.

## 2013-02-03 NOTE — Assessment & Plan Note (Signed)
Continues to have DOE, however much improved since LVAD support and likely related to lung disease (DLCO 26).

## 2013-02-04 ENCOUNTER — Telehealth (HOSPITAL_COMMUNITY): Payer: Self-pay | Admitting: *Deleted

## 2013-02-04 NOTE — Telephone Encounter (Signed)
Per wife pt took the 20 mg of Lisinopril last night as instructed to restart, she states he is feeling very dizzy and lightheaded, per Ulla Potash, NP stop Lisinopril and will try to restart at next OV at smaller dose, pt wife agreeable

## 2013-02-10 ENCOUNTER — Ambulatory Visit (HOSPITAL_COMMUNITY): Payer: Self-pay | Admitting: Anesthesiology

## 2013-02-10 ENCOUNTER — Encounter (HOSPITAL_COMMUNITY): Payer: Self-pay

## 2013-02-10 ENCOUNTER — Ambulatory Visit (HOSPITAL_COMMUNITY)
Admission: RE | Admit: 2013-02-10 | Discharge: 2013-02-10 | Disposition: A | Discharge status: Home / Self Care | Payer: Medicare Other | Source: Ambulatory Visit | Attending: Internal Medicine | Admitting: Internal Medicine

## 2013-02-10 VITALS — BP 96/1 | Ht 72.0 in | Wt 225.1 lb

## 2013-02-10 DIAGNOSIS — I4891 Unspecified atrial fibrillation: Secondary | ICD-10-CM

## 2013-02-10 DIAGNOSIS — Z95818 Presence of other cardiac implants and grafts: Secondary | ICD-10-CM

## 2013-02-10 DIAGNOSIS — R0602 Shortness of breath: Secondary | ICD-10-CM | POA: Insufficient documentation

## 2013-02-10 DIAGNOSIS — I5022 Chronic systolic (congestive) heart failure: Secondary | ICD-10-CM | POA: Insufficient documentation

## 2013-02-10 DIAGNOSIS — Z95811 Presence of heart assist device: Secondary | ICD-10-CM

## 2013-02-10 DIAGNOSIS — R0600 Dyspnea, unspecified: Secondary | ICD-10-CM

## 2013-02-10 DIAGNOSIS — I2510 Atherosclerotic heart disease of native coronary artery without angina pectoris: Secondary | ICD-10-CM | POA: Insufficient documentation

## 2013-02-10 DIAGNOSIS — R0609 Other forms of dyspnea: Secondary | ICD-10-CM

## 2013-02-10 DIAGNOSIS — Z95810 Presence of automatic (implantable) cardiac defibrillator: Secondary | ICD-10-CM | POA: Insufficient documentation

## 2013-02-10 DIAGNOSIS — R0989 Other specified symptoms and signs involving the circulatory and respiratory systems: Secondary | ICD-10-CM

## 2013-02-10 LAB — CBC
HCT: 33.2 % — ABNORMAL LOW (ref 39.0–52.0)
MCHC: 29.5 g/dL — ABNORMAL LOW (ref 30.0–36.0)
Platelets: 183 10*3/uL (ref 150–400)
RDW: 23.3 % — ABNORMAL HIGH (ref 11.5–15.5)
WBC: 9.1 10*3/uL (ref 4.0–10.5)

## 2013-02-10 LAB — PROTIME-INR: INR: 2.56 — ABNORMAL HIGH (ref 0.00–1.49)

## 2013-02-10 LAB — BASIC METABOLIC PANEL
CO2: 24 mEq/L (ref 19–32)
Calcium: 9.7 mg/dL (ref 8.4–10.5)
Creatinine, Ser: 0.89 mg/dL (ref 0.50–1.35)
GFR calc non Af Amer: 84 mL/min — ABNORMAL LOW (ref >90)
Glucose, Bld: 117 mg/dL — ABNORMAL HIGH (ref 70–99)
Sodium: 135 mEq/L (ref 135–145)

## 2013-02-10 LAB — LACTATE DEHYDROGENASE: LDH: 301 U/L — ABNORMAL HIGH (ref 94–250)

## 2013-02-10 NOTE — Progress Notes (Addendum)
Patient ID: [name], male   DOB: September 17, 1941, 72 y.o.   MRN: [id number] HPI: [name] is a 72 year old gentleman with a history of CABG (2003), Chronic systolic HF EF 20-25% %, S/P AICD (generator change 2011), hyperlipidemia, hypothyroidism, emphysema, OSA, intermittent atrial fibrillation (on coumadin). Quit smoking 2003. Uses CPAP and O2 every night. He is a disabled [employer]. He was referred to the AHF clinic by Dr. Elease Hashimoto and over the past couple of months his functional capacity has really declined. He has class IV heart failure symptoms and has not improved on OMM. His CPX in 11/13 showed a VO2 of 9.2%, VE/VCO2- 52.8, OUES 1.15 and RER= 1.02. We have been following him closely and have had multiple discussions about his options and the patient decided that he wanted to proceed with LVAD placement. I discussed the patient with Dr Romona Curls at [facility] who agreed that the patient was an acceptable DT VAD candidate- his pulm hx of asbestosis, low DLCO are a problem for transplant candidacy  LVAD HM II implanted 01/12/13  Follow up: Doing well. Working with HHPT and ambulating with minimal SOB. Can walk up and down the driveway twice without stopping. Taking medications as prescribed. Stopped lisinopril since last visit d/t dizziness. Weight at home 220-224 lbs. Denies orthopnea, edema, CP, or SOB. Skin tear around driveline.   Denies LVAD alarms.  Denies driveline trauma, erythema or drainage.  Denies ICD shocks.   Reports taking Coumadin as prescribed and adherence to anticoagulation based dietary restrictions.  Denies bright red blood per rectum or melena, no dark urine or hematuria.    Past Medical History  Diagnosis Date  . Ischemic cardiomyopathy      CABG 2003, PCI 2007  EF 27%(myoview 2012  . CHF (congestive heart failure)     class I-II  . Sleep apnea     ?  [name] Intestine disorder     blockage  . ICD (implantable cardiac defibrillator) in place   . Hyperlipidemia   . Hypothyroidism    . Chronic anticoagulation   . Obesity   . COPD (chronic obstructive pulmonary disease)   . Asbestosis   . Atrial fibrillation     paroxysmal; on coumadin  . Paroxysmal ventricular tachycardia     has ICD in place  . Coronary artery disease     Current Outpatient Prescriptions  Medication Sig Dispense Refill  . albuterol (PROVENTIL) (2.5 MG/3ML) 0.083% nebulizer solution Take 2.5 mg by nebulization every 6 (six) hours as needed.       [name] aspirin 81 MG tablet Take 81 mg by mouth daily.      . budesonide-formoterol (SYMBICORT) 160-4.5 MCG/ACT inhaler Inhale 2 puffs into the lungs 2 (two) times daily.      . citalopram (CELEXA) 40 MG tablet Take 1 tablet (40 mg total) by mouth daily.  30 tablet  3  . docusate sodium (COLACE) 100 MG capsule Take 200 mg by mouth at bedtime.       . ferrous fumarate-b12-vitamic C-folic acid (TRINSICON / FOLTRIN) capsule Take 1 capsule by mouth 3 (three) times daily after meals.  90 capsule  3  . furosemide (LASIX) 40 MG tablet Take 40 mg by mouth daily. Weight over 226 take extra lasix      . levothyroxine (SYNTHROID, LEVOTHROID) 100 MCG tablet Take 50 mcg by mouth at bedtime.       . metoprolol succinate (TOPROL-XL) 25 MG 24 hr tablet Take 1 tablet (25 mg total) by mouth daily.  30 tablet  3  . nitroGLYCERIN (NITROSTAT) 0.4 MG SL tablet Place 0.4 mg under the tongue every 5 (five) minutes as needed.        . pantoprazole (PROTONIX) 40 MG tablet Take 1 tablet (40 mg total) by mouth daily.  30 tablet  3  . potassium chloride SA (K-DUR,KLOR-CON) 20 MEQ tablet Take 1 tablet (20 mEq total) by mouth 2 (two) times daily.  60 tablet  3  . simvastatin (ZOCOR) 80 MG tablet Take 40 mg by mouth at bedtime.        [name] tiotropium (SPIRIVA) 18 MCG inhalation capsule Place 18 mcg into inhaler and inhale daily.       . traMADol (ULTRAM) 50 MG tablet Take 1 tablet (50 mg total) by mouth every 6 (six) hours as needed.  30 tablet  3  . warfarin (COUMADIN) 5 MG tablet Take 1 tablet  (5 mg total) by mouth daily. As directed  45 tablet  3   No current facility-administered medications for this encounter.    Review of patient's allergies indicates no known allergies.  REVIEW OF SYSTEMS: All systems negative except as listed in HPI, PMH and Problem list.   LVAD INTERROGATION:   HeartMate II LVAD:  Flow 4.5 liters/min, speed 9200, power 5.1, PI 4.9.  Back up speed set to 8600.  I reviewed the LVAD parameters from today, and compared the results to the patient's prior recorded data.  No programming changes were made.  The LVAD is functioning within specified parameters.  The patient performs LVAD self-test daily.  LVAD interrogation was negative for any significant power changes, alarms or PI events/speed drops.  LVAD equipment check completed and is in good working order.  Back-up equipment present.   LVAD education done on emergency procedures and precautions and reviewed exit site care.   Physical Exam: Filed Vitals:   02/10/13 1325  BP: 96/1  Height: 6' (1.829 m)  Weight: 225 lb 1.9 oz (102.114 kg)  Last weight: 226 lbs  GENERAL: Well appearing, male who presents to clinic today in NAD. HEENT: normal  NECK: Supple, JVP 6-7;  2+ bilaterally, no bruits.  No lymphadenopathy or thyromegaly appreciated.   CARDIAC: LVAD hum present.  LUNGS:  Clear to auscultation bilaterally.  ABDOMEN:  Soft, round, nontender, positive bowel sounds x4.     LVAD exit site: semi incorporated with scab;  Dressing dry and intact.  Erythema around exit site; cool to touch; No drainage.  Stabilization device present and accurately applied.  Driveline dressing is being changed daily per sterile technique. Changed in clinic EXTREMITIES:  Warm and dry, no cyanosis, clubbing, rash or edema  NEUROLOGIC:  Alert and oriented x 4.  Gait steady.  No aphasia.  No dysarthria.  Affect pleasant.       ASSESSMENT AND PLAN:   1. Status post LVAD implantation - Patient comes to clinic today for routine  follow up visit. Currently has NYHA class III symptoms on LVAD support.   2.  Anticoagulation management - INR goal 2.0-3.0.  Will evaluate INR value today and follow up with patient for necessary changes.

## 2013-02-10 NOTE — Patient Instructions (Signed)
Refer to anticoag flowsheet.

## 2013-02-10 NOTE — Assessment & Plan Note (Addendum)
Doing great. NYHA III on VAD. Volume status stable. MAP slightly elevated, will continue to monitor. If next visit will likely titrate BB, since Lisinopril caused patient to be extremely dizzy. Labs today and will call patient with results. Follow up 1 week.

## 2013-02-10 NOTE — Patient Instructions (Addendum)
Continue to walk as much as possible.  Doing a great job!!!  Will call with lab results.  Follow up 1 week.

## 2013-02-10 NOTE — Assessment & Plan Note (Signed)
Dyspnea improving. Continues to work with HHPT.

## 2013-02-10 NOTE — Assessment & Plan Note (Signed)
Doing great and VAD parameters stable. Interrogation showed minimal PI events. Driveline still has some erythema around exit site, however no drainage, odor, pain and cool to touch. Will continue to monitor.

## 2013-02-17 ENCOUNTER — Encounter (HOSPITAL_COMMUNITY): Payer: Self-pay

## 2013-02-17 ENCOUNTER — Ambulatory Visit (HOSPITAL_COMMUNITY): Payer: Self-pay | Admitting: Anesthesiology

## 2013-02-17 ENCOUNTER — Ambulatory Visit (HOSPITAL_COMMUNITY)
Admission: RE | Admit: 2013-02-17 | Discharge: 2013-02-17 | Disposition: A | Discharge status: Home / Self Care | Payer: Medicare Other | Source: Ambulatory Visit | Attending: Internal Medicine | Admitting: Internal Medicine

## 2013-02-17 VITALS — BP 92/1 | Wt 225.5 lb

## 2013-02-17 DIAGNOSIS — I5022 Chronic systolic (congestive) heart failure: Secondary | ICD-10-CM

## 2013-02-17 DIAGNOSIS — Z95818 Presence of other cardiac implants and grafts: Secondary | ICD-10-CM

## 2013-02-17 DIAGNOSIS — Z95811 Presence of heart assist device: Secondary | ICD-10-CM

## 2013-02-17 DIAGNOSIS — Z95810 Presence of automatic (implantable) cardiac defibrillator: Secondary | ICD-10-CM | POA: Insufficient documentation

## 2013-02-17 DIAGNOSIS — I4891 Unspecified atrial fibrillation: Secondary | ICD-10-CM

## 2013-02-17 LAB — CBC
MCH: 28.9 pg (ref 26.0–34.0)
MCV: 97.3 fL (ref 78.0–100.0)
Platelets: 182 10*3/uL (ref 150–400)
RDW: 22.9 % — ABNORMAL HIGH (ref 11.5–15.5)

## 2013-02-17 LAB — PROTIME-INR: Prothrombin Time: 27.4 seconds — ABNORMAL HIGH (ref 11.6–15.2)

## 2013-02-17 LAB — BASIC METABOLIC PANEL
CO2: 24 mEq/L (ref 19–32)
Calcium: 9.7 mg/dL (ref 8.4–10.5)
Creatinine, Ser: 0.77 mg/dL (ref 0.50–1.35)
GFR calc non Af Amer: 89 mL/min — ABNORMAL LOW (ref >90)
Glucose, Bld: 92 mg/dL (ref 70–99)
Sodium: 136 mEq/L (ref 135–145)

## 2013-02-17 NOTE — Assessment & Plan Note (Signed)
VAD parameters stable. Continue to follow. Driveline site looks great and is healing well.

## 2013-02-17 NOTE — Progress Notes (Signed)
Patient ID: [name], male   DOB: 09/04/1941, 72 y.o.   MRN: [id number] HPI: [name] is a 72 year old gentleman with a history of CABG (2003), Chronic systolic HF EF 20-25% %, S/P AICD (generator change 2011), hyperlipidemia, hypothyroidism, emphysema, OSA, intermittent atrial fibrillation (on coumadin). Quit smoking 2003. Uses CPAP and O2 every night. He is a disabled [employer]. He was referred to the AHF clinic by Dr. Elease Hashimoto and over the past couple of months his functional capacity has really declined. He has class IV heart failure symptoms and has not improved on OMM. His CPX in 11/13 showed a VO2 of 9.2%, VE/VCO2- 52.8, OUES 1.15 and RER= 1.02.   LVAD HM II implanted 01/12/13  Follow up:  Patient continuing to progress. Working with HHPT and is walking about 350-400 ft. They are going to d/c him on 5/27 and we are working on getting CR started. +Minimal SOB and fatigue with walking, but improving. Denies CP, orthopnea, weight gain, or dizziness. Working with PT on balance.   Denies LVAD alarms.  Denies driveline trauma, erythema or drainage.  Denies ICD shocks.   Reports taking Coumadin as prescribed and adherence to anticoagulation based dietary restrictions.  Denies bright red blood per rectum or melena, no dark urine or hematuria.    Past Medical History  Diagnosis Date  . Ischemic cardiomyopathy      CABG 2003, PCI 2007  EF 27%(myoview 2012  . CHF (congestive heart failure)     class I-II  . Sleep apnea     ?  [name] Intestine disorder     blockage  . ICD (implantable cardiac defibrillator) in place   . Hyperlipidemia   . Hypothyroidism   . Chronic anticoagulation   . Obesity   . COPD (chronic obstructive pulmonary disease)   . Asbestosis   . Atrial fibrillation     paroxysmal; on coumadin  . Paroxysmal ventricular tachycardia     has ICD in place  . Coronary artery disease     Current Outpatient Prescriptions  Medication Sig Dispense Refill  . albuterol (PROVENTIL)  (2.5 MG/3ML) 0.083% nebulizer solution Take 2.5 mg by nebulization every 6 (six) hours as needed.       [name] aspirin 81 MG tablet Take 81 mg by mouth daily.      . budesonide-formoterol (SYMBICORT) 160-4.5 MCG/ACT inhaler Inhale 2 puffs into the lungs 2 (two) times daily.      . citalopram (CELEXA) 40 MG tablet Take 1 tablet (40 mg total) by mouth daily.  30 tablet  3  . docusate sodium (COLACE) 100 MG capsule Take 200 mg by mouth at bedtime.       . ferrous fumarate-b12-vitamic C-folic acid (TRINSICON / FOLTRIN) capsule Take 1 capsule by mouth 3 (three) times daily after meals.  90 capsule  3  . furosemide (LASIX) 40 MG tablet Take 40 mg by mouth daily. Weight over 226 take extra lasix      . levothyroxine (SYNTHROID, LEVOTHROID) 100 MCG tablet Take 50 mcg by mouth at bedtime.       . metoprolol succinate (TOPROL-XL) 25 MG 24 hr tablet Take 1 tablet (25 mg total) by mouth daily.  30 tablet  3  . nitroGLYCERIN (NITROSTAT) 0.4 MG SL tablet Place 0.4 mg under the tongue every 5 (five) minutes as needed.        . pantoprazole (PROTONIX) 40 MG tablet Take 1 tablet (40 mg total) by mouth daily.  30 tablet  3  .  potassium chloride SA (K-DUR,KLOR-CON) 20 MEQ tablet Take 1 tablet (20 mEq total) by mouth 2 (two) times daily.  60 tablet  3  . simvastatin (ZOCOR) 80 MG tablet Take 40 mg by mouth at bedtime.        [name] tiotropium (SPIRIVA) 18 MCG inhalation capsule Place 18 mcg into inhaler and inhale daily.       . traMADol (ULTRAM) 50 MG tablet Take 1 tablet (50 mg total) by mouth every 6 (six) hours as needed.  30 tablet  3  . warfarin (COUMADIN) 5 MG tablet Take 1 tablet (5 mg total) by mouth daily. As directed  45 tablet  3   No current facility-administered medications for this encounter.    Review of patient's allergies indicates no known allergies.  REVIEW OF SYSTEMS: All systems negative except as listed in HPI, PMH and Problem list.   LVAD INTERROGATION:   HeartMate II LVAD:  Flow 6.0 liters/min,  speed 9200, power 5.7, PI 4.9.  Back up speed set to 8600.  I reviewed the LVAD parameters from today, and compared the results to the patient's prior recorded data.  No programming changes were made.  The LVAD is functioning within specified parameters.  The patient performs LVAD self-test daily.  LVAD interrogation was negative for any significant power changes, alarms or PI events/speed drops.  LVAD equipment check completed and is in good working order.  Back-up equipment present.   LVAD education done on emergency procedures and precautions and reviewed exit site care.   Physical Exam: Filed Vitals:   02/17/13 1351  BP: 92/1  Weight: 225 lb 8 oz (102.286 kg)  Last weight: 226 lbs  GENERAL: Well appearing, male who presents to clinic today in NAD. HEENT: normal  NECK: Supple, JVP 8-9;  2+ bilaterally, no bruits.  No lymphadenopathy or thyromegaly appreciated.   CARDIAC: LVAD hum present.  LUNGS:  Clear to auscultation bilaterally.  ABDOMEN:  Soft, round, nontender, positive bowel sounds x4.     LVAD exit site: semi incorporated with scab;  Dressing dry and intact; No drainage.  Stabilization device present and accurately applied.  Driveline dressing is being changed daily per sterile technique. Changed in clinic EXTREMITIES:  Warm and dry, no cyanosis, clubbing, rash or edema  NEUROLOGIC:  Alert and oriented x 4.  Gait steady.  No aphasia.  No dysarthria.  Affect pleasant.       ASSESSMENT AND PLAN:   1. Status post LVAD implantation - Patient comes to clinic today for routine follow up visit. Currently has NYHA class IIb/III symptoms on LVAD support.   2.  Anticoagulation management - INR goal 2.0-3.0.  Will evaluate INR value today and follow up with patient for necessary changes.

## 2013-02-17 NOTE — Assessment & Plan Note (Addendum)
Stable. NYHA II/III on LVAD support. Volume status slightly elevated will increase lasix to 80 mg x 2 days and supplement K+. Will follow up in 2 weeks. MAP slightly elevated 92, can consider adding ACE-I or Cleda Daub next visit if continues to be elevated.

## 2013-02-17 NOTE — Patient Instructions (Addendum)
Take extra 40 mg lasix x 2 days.  Take extra 20 meq potassium for 2 days.  Follow up 2 weeks.   Keep up the good work.

## 2013-02-17 NOTE — Patient Instructions (Signed)
Refer to anticoag flowsheet.

## 2013-02-23 ENCOUNTER — Other Ambulatory Visit (HOSPITAL_COMMUNITY): Payer: Self-pay | Admitting: Anesthesiology

## 2013-02-24 ENCOUNTER — Encounter (HOSPITAL_COMMUNITY): Payer: Self-pay | Admitting: Anesthesiology

## 2013-02-24 MED ORDER — FUROSEMIDE 40 MG PO TABS: 40.0000 mg | ORAL_TABLET | Freq: Every day | ORAL | Status: DC

## 2013-02-24 NOTE — Progress Notes (Signed)
This encounter was created in error - please disregard.

## 2013-03-03 ENCOUNTER — Encounter (HOSPITAL_COMMUNITY): Payer: Self-pay

## 2013-03-03 ENCOUNTER — Ambulatory Visit (HOSPITAL_COMMUNITY): Payer: Self-pay | Admitting: Anesthesiology

## 2013-03-03 ENCOUNTER — Ambulatory Visit (HOSPITAL_COMMUNITY)
Admission: RE | Admit: 2013-03-03 | Discharge: 2013-03-03 | Disposition: A | Discharge status: Home / Self Care | Payer: Medicare Other | Source: Ambulatory Visit | Attending: Internal Medicine | Admitting: Internal Medicine

## 2013-03-03 VITALS — BP 82/1 | HR 88 | Wt 227.0 lb

## 2013-03-03 DIAGNOSIS — J449 Chronic obstructive pulmonary disease, unspecified: Secondary | ICD-10-CM | POA: Insufficient documentation

## 2013-03-03 DIAGNOSIS — Z95810 Presence of automatic (implantable) cardiac defibrillator: Secondary | ICD-10-CM | POA: Insufficient documentation

## 2013-03-03 DIAGNOSIS — J4489 Other specified chronic obstructive pulmonary disease: Secondary | ICD-10-CM | POA: Insufficient documentation

## 2013-03-03 DIAGNOSIS — E785 Hyperlipidemia, unspecified: Secondary | ICD-10-CM | POA: Insufficient documentation

## 2013-03-03 DIAGNOSIS — Z95818 Presence of other cardiac implants and grafts: Secondary | ICD-10-CM

## 2013-03-03 DIAGNOSIS — Z7901 Long term (current) use of anticoagulants: Secondary | ICD-10-CM | POA: Insufficient documentation

## 2013-03-03 DIAGNOSIS — I4891 Unspecified atrial fibrillation: Secondary | ICD-10-CM | POA: Insufficient documentation

## 2013-03-03 DIAGNOSIS — I509 Heart failure, unspecified: Secondary | ICD-10-CM | POA: Insufficient documentation

## 2013-03-03 DIAGNOSIS — Z95811 Presence of heart assist device: Secondary | ICD-10-CM

## 2013-03-03 DIAGNOSIS — I5022 Chronic systolic (congestive) heart failure: Secondary | ICD-10-CM | POA: Insufficient documentation

## 2013-03-03 LAB — CBC
HCT: 33.6 % — ABNORMAL LOW (ref 39.0–52.0)
MCH: 30.2 pg (ref 26.0–34.0)
MCHC: 29.2 g/dL — ABNORMAL LOW (ref 30.0–36.0)
MCV: 103.7 fL — ABNORMAL HIGH (ref 78.0–100.0)
Platelets: 172 10*3/uL (ref 150–400)
RDW: 20.9 % — ABNORMAL HIGH (ref 11.5–15.5)

## 2013-03-03 LAB — BASIC METABOLIC PANEL
BUN: 18 mg/dL (ref 6–23)
Calcium: 9.9 mg/dL (ref 8.4–10.5)
Creatinine, Ser: 0.78 mg/dL (ref 0.50–1.35)
GFR calc Af Amer: >90 mL/min (ref >90)
GFR calc non Af Amer: 89 mL/min — ABNORMAL LOW (ref >90)

## 2013-03-03 NOTE — Progress Notes (Signed)
Patient ID: [name], male   DOB: [birth year], 72 y.o.   MRN: [id number]  HPI: [name] is a 72 year old gentleman with a history of CABG (2003), Chronic systolic HF EF 20-25% %, S/P AICD (generator change 2011), hyperlipidemia, hypothyroidism, emphysema, OSA, intermittent atrial fibrillation (on coumadin). Quit smoking 2003. Uses CPAP and O2 every night. He is a disabled [employer]. He was referred to the AHF clinic by Dr. Elease Wood and over the past couple of months his functional capacity has really declined. He has class IV heart failure symptoms and has not improved on OMM. His CPX in 11/13 showed a VO2 of 9.2%, VE/VCO2- 52.8, OUES 1.15 and RER= 1.02.   LVAD HM II implanted 01/12/13  Follow up:  Doing great. Went to [place] PCP yesterday and they have changed him from metoprolol succinate 25 mg BID to metoprolol tartrate 12.5 mg BID, until request by cardiologist. Done with HHPT. Ambulating with no SOB. Denies orthopnea/edema or CP. Wearing CPAP at night.   Denies LVAD alarms.  Denies driveline trauma, erythema or drainage.  Denies ICD shocks.   Reports taking Coumadin as prescribed and adherence to anticoagulation based dietary restrictions.  Denies bright red blood per rectum or melena, no dark urine or hematuria.    Past Medical History  Diagnosis Date  . Ischemic cardiomyopathy      CABG 2003, PCI 2007  EF 27%(myoview 2012  . CHF (congestive heart failure)     class I-II  . Sleep apnea     ?  [name] Intestine disorder     blockage  . ICD (implantable cardiac defibrillator) in place   . Hyperlipidemia   . Hypothyroidism   . Chronic anticoagulation   . Obesity   . COPD (chronic obstructive pulmonary disease)   . Asbestosis(501)   . Atrial fibrillation     paroxysmal; on coumadin  . Paroxysmal ventricular tachycardia     has ICD in place  . Coronary artery disease     Current Outpatient Prescriptions  Medication Sig Dispense Refill  . albuterol (PROVENTIL) (2.5 MG/3ML) 0.083%  nebulizer solution Take 2.5 mg by nebulization every 6 (six) hours as needed.       [name] aspirin 81 MG tablet Take 81 mg by mouth daily.      . budesonide-formoterol (SYMBICORT) 160-4.5 MCG/ACT inhaler Inhale 2 puffs into the lungs 2 (two) times daily.      . citalopram (CELEXA) 40 MG tablet Take 1 tablet (40 mg total) by mouth daily.  30 tablet  3  . docusate sodium (COLACE) 100 MG capsule Take 200 mg by mouth at bedtime.       . ferrous fumarate-b12-vitamic C-folic acid (TRINSICON / FOLTRIN) capsule Take 1 capsule by mouth 3 (three) times daily after meals.  90 capsule  3  . furosemide (LASIX) 40 MG tablet Take 1 tablet (40 mg total) by mouth daily. IF Weight over 226 take extra lasix  45 tablet  6  . levothyroxine (SYNTHROID, LEVOTHROID) 100 MCG tablet Take 50 mcg by mouth at bedtime.       . metoprolol succinate (TOPROL-XL) 25 MG 24 hr tablet Take 1 tablet (25 mg total) by mouth daily.  30 tablet  3  . nitroGLYCERIN (NITROSTAT) 0.4 MG SL tablet Place 0.4 mg under the tongue every 5 (five) minutes as needed.        . pantoprazole (PROTONIX) 40 MG tablet Take 1 tablet (40 mg total) by mouth daily.  30 tablet  3  .  potassium chloride SA (K-DUR,KLOR-CON) 20 MEQ tablet Take 1 tablet (20 mEq total) by mouth 2 (two) times daily.  60 tablet  3  . simvastatin (ZOCOR) 80 MG tablet Take 40 mg by mouth at bedtime.        [name] tiotropium (SPIRIVA) 18 MCG inhalation capsule Place 18 mcg into inhaler and inhale daily.       . traMADol (ULTRAM) 50 MG tablet Take 1 tablet (50 mg total) by mouth every 6 (six) hours as needed.  30 tablet  3  . warfarin (COUMADIN) 5 MG tablet Take 1 tablet (5 mg total) by mouth daily. As directed  45 tablet  3   No current facility-administered medications for this encounter.    Review of patient's allergies indicates no known allergies.  REVIEW OF SYSTEMS: All systems negative except as listed in HPI, PMH and Problem list.   LVAD INTERROGATION:   HeartMate II LVAD:  Flow 4.3  liters/min, speed 9200, power 4.9, PI 5.7.  Back up speed set to 8600.  I reviewed the LVAD parameters from today, and compared the results to the patient's prior recorded data.  No programming changes were made.  The LVAD is functioning within specified parameters.  The patient performs LVAD self-test daily.  LVAD interrogation was negative for any significant power changes, alarms or PI events/speed drops.  LVAD equipment check completed and is in good working order.  Back-up equipment present.   LVAD education done on emergency procedures and precautions and reviewed exit site care.   Physical Exam: Filed Vitals:   03/03/13 1319  BP: 82/1  Pulse: 88  Weight: 227 lb (102.967 kg)  SpO2: 99%  Last weight: 225. lbs  GENERAL: Well appearing, male who presents to clinic today in NAD. HEENT: normal  NECK: Supple, JVP flat;  2+ bilaterally, no bruits.  No lymphadenopathy or thyromegaly appreciated.   CARDIAC: LVAD hum present.  LUNGS:  Clear to auscultation bilaterally.  ABDOMEN:  Soft, round, nontender, positive bowel sounds x4.     LVAD exit site: semi incorporated;  Dressing dry and intact; No drainage.  Stabilization device present and accurately applied.  Driveline dressing is being changed daily per sterile technique. Changed in clinic EXTREMITIES:  Warm and dry, no cyanosis, clubbing, rash or edema  NEUROLOGIC:  Alert and oriented x 4.  Gait steady.  No aphasia.  No dysarthria.  Affect pleasant.       ASSESSMENT AND PLAN:   1. Status post LVAD implantation - Patient comes to clinic today for routine follow up visit. Currently has NYHA class II symptoms on LVAD support.   2.  Anticoagulation management - INR goal 2.0-3.0.  Will evaluate INR value today and follow up with patient for necessary changes.

## 2013-03-03 NOTE — Patient Instructions (Addendum)
Doing great!!!  Only take lasix (furosemide) if your weight is above 226 lbs.  Do not take your potassium unless you take your lasix.  Follow up 2 weeks.  Do the following things EVERYDAY: 1) Weigh yourself in the morning before breakfast. Write it down and keep it in a log. 2) Take your medicines as prescribed 3) Eat low salt foods-Limit salt (sodium) to 2000 mg per day.  4) Stay as active as you can everyday 5) Limit all fluids for the day to less than 2 liters 6)

## 2013-03-09 ENCOUNTER — Telehealth (HOSPITAL_COMMUNITY): Payer: Self-pay | Admitting: *Deleted

## 2013-03-09 DIAGNOSIS — I5022 Chronic systolic (congestive) heart failure: Secondary | ICD-10-CM

## 2013-03-09 NOTE — Assessment & Plan Note (Signed)
Device interrogated personally. No significant alarms. No changes made. Labs pending.

## 2013-03-09 NOTE — Telephone Encounter (Signed)
Per Ryan Potash, NP pt is due for INR tomorrow pt will go to [facility], order placed

## 2013-03-09 NOTE — Assessment & Plan Note (Addendum)
Patient seen and examined with Ulla Potash, NP. We discussed all aspects of the encounter. I agree with the assessment as stated above. My thoughts below.  Doing extremely well with VAD therapy. He looks great. Volume status and MAPs OK. Reinforced need for daily weights and reviewed use of sliding scale diuretics. Take lasix only for weight > 226. Reviewed safety precautions. Dressing changed in clinic. Labs pending.

## 2013-03-10 ENCOUNTER — Other Ambulatory Visit (INDEPENDENT_AMBULATORY_CARE_PROVIDER_SITE_OTHER): Payer: Medicare Other

## 2013-03-10 ENCOUNTER — Ambulatory Visit (HOSPITAL_COMMUNITY): Payer: Self-pay | Admitting: Anesthesiology

## 2013-03-10 DIAGNOSIS — I4891 Unspecified atrial fibrillation: Secondary | ICD-10-CM

## 2013-03-10 DIAGNOSIS — I5022 Chronic systolic (congestive) heart failure: Secondary | ICD-10-CM

## 2013-03-10 DIAGNOSIS — Z95811 Presence of heart assist device: Secondary | ICD-10-CM

## 2013-03-10 LAB — PROTIME-INR
INR: 4.3 — AB (ref <=1.1)
Prothrombin Time: 43.8 s — ABNORMAL HIGH (ref 10.2–12.4)

## 2013-03-11 ENCOUNTER — Telehealth (HOSPITAL_COMMUNITY): Payer: Self-pay | Admitting: *Deleted

## 2013-03-11 DIAGNOSIS — I5022 Chronic systolic (congestive) heart failure: Secondary | ICD-10-CM

## 2013-03-11 NOTE — Telephone Encounter (Signed)
Pt will have INR checked Mon 6/16, pt aware pt will go to [facility]

## 2013-03-15 ENCOUNTER — Ambulatory Visit (HOSPITAL_COMMUNITY): Payer: Self-pay | Admitting: Anesthesiology

## 2013-03-15 ENCOUNTER — Other Ambulatory Visit: Payer: Medicare Other

## 2013-03-15 DIAGNOSIS — I5022 Chronic systolic (congestive) heart failure: Secondary | ICD-10-CM

## 2013-03-15 DIAGNOSIS — Z95811 Presence of heart assist device: Secondary | ICD-10-CM

## 2013-03-15 DIAGNOSIS — I4891 Unspecified atrial fibrillation: Secondary | ICD-10-CM

## 2013-03-15 LAB — PROTIME-INR
INR: 3 ratio — ABNORMAL HIGH (ref 0.8–1.0)
Prothrombin Time: 31.1 s — ABNORMAL HIGH (ref 10.2–12.4)

## 2013-03-17 DIAGNOSIS — I2510 Atherosclerotic heart disease of native coronary artery without angina pectoris: Secondary | ICD-10-CM

## 2013-03-19 ENCOUNTER — Ambulatory Visit (HOSPITAL_COMMUNITY): Payer: Self-pay | Admitting: Anesthesiology

## 2013-03-19 ENCOUNTER — Encounter (HOSPITAL_COMMUNITY): Payer: Self-pay

## 2013-03-19 ENCOUNTER — Ambulatory Visit (HOSPITAL_COMMUNITY)
Admission: RE | Admit: 2013-03-19 | Discharge: 2013-03-19 | Disposition: A | Discharge status: Home / Self Care | Payer: Medicare Other | Source: Ambulatory Visit | Attending: Internal Medicine | Admitting: Internal Medicine

## 2013-03-19 VITALS — BP 92/1 | HR 98 | Wt 226.8 lb

## 2013-03-19 DIAGNOSIS — Z95811 Presence of heart assist device: Secondary | ICD-10-CM

## 2013-03-19 DIAGNOSIS — E785 Hyperlipidemia, unspecified: Secondary | ICD-10-CM | POA: Insufficient documentation

## 2013-03-19 DIAGNOSIS — I4891 Unspecified atrial fibrillation: Secondary | ICD-10-CM | POA: Insufficient documentation

## 2013-03-19 DIAGNOSIS — Z95810 Presence of automatic (implantable) cardiac defibrillator: Secondary | ICD-10-CM | POA: Insufficient documentation

## 2013-03-19 DIAGNOSIS — Z951 Presence of aortocoronary bypass graft: Secondary | ICD-10-CM | POA: Insufficient documentation

## 2013-03-19 DIAGNOSIS — I5022 Chronic systolic (congestive) heart failure: Secondary | ICD-10-CM | POA: Insufficient documentation

## 2013-03-19 DIAGNOSIS — Z79899 Other long term (current) drug therapy: Secondary | ICD-10-CM | POA: Insufficient documentation

## 2013-03-19 DIAGNOSIS — J438 Other emphysema: Secondary | ICD-10-CM | POA: Insufficient documentation

## 2013-03-19 DIAGNOSIS — I509 Heart failure, unspecified: Secondary | ICD-10-CM | POA: Insufficient documentation

## 2013-03-19 DIAGNOSIS — Z7901 Long term (current) use of anticoagulants: Secondary | ICD-10-CM | POA: Insufficient documentation

## 2013-03-19 DIAGNOSIS — G4733 Obstructive sleep apnea (adult) (pediatric): Secondary | ICD-10-CM | POA: Insufficient documentation

## 2013-03-19 DIAGNOSIS — J61 Pneumoconiosis due to asbestos and other mineral fibers: Secondary | ICD-10-CM | POA: Insufficient documentation

## 2013-03-19 DIAGNOSIS — I5023 Acute on chronic systolic (congestive) heart failure: Secondary | ICD-10-CM

## 2013-03-19 DIAGNOSIS — I2589 Other forms of chronic ischemic heart disease: Secondary | ICD-10-CM | POA: Insufficient documentation

## 2013-03-19 DIAGNOSIS — E039 Hypothyroidism, unspecified: Secondary | ICD-10-CM | POA: Insufficient documentation

## 2013-03-19 DIAGNOSIS — Z95818 Presence of other cardiac implants and grafts: Secondary | ICD-10-CM

## 2013-03-19 DIAGNOSIS — E669 Obesity, unspecified: Secondary | ICD-10-CM | POA: Insufficient documentation

## 2013-03-19 DIAGNOSIS — Z87891 Personal history of nicotine dependence: Secondary | ICD-10-CM | POA: Insufficient documentation

## 2013-03-19 LAB — PROTIME-INR
INR: 2.79 — ABNORMAL HIGH (ref 0.00–1.49)
Prothrombin Time: 28 seconds — ABNORMAL HIGH (ref 11.6–15.2)

## 2013-03-19 LAB — BASIC METABOLIC PANEL
CO2: 26 mEq/L (ref 19–32)
Calcium: 9.8 mg/dL (ref 8.4–10.5)
GFR calc non Af Amer: 85 mL/min — ABNORMAL LOW (ref >90)
Potassium: 4 mEq/L (ref 3.5–5.1)
Sodium: 139 mEq/L (ref 135–145)

## 2013-03-19 LAB — CBC
MCH: 31.2 pg (ref 26.0–34.0)
Platelets: 174 10*3/uL (ref 150–400)
RBC: 3.3 MIL/uL — ABNORMAL LOW (ref 4.22–5.81)

## 2013-03-19 NOTE — Progress Notes (Signed)
Patient ID: [name], male   DOB: 04/13/1941, 72 y.o.   MRN: [id number]  HPI: Karim is a 72 year old gentleman with a history of CABG (2003), Chronic systolic HF EF 20-25% %, S/P AICD (generator change 2011), hyperlipidemia, hypothyroidism, emphysema, OSA, intermittent atrial fibrillation (on coumadin). Quit smoking 2003. Uses CPAP and O2 every night. He is a disabled [employer].   He is s/p LVAD HM II implanted 01/12/13  Follow up:   Feels great. Able to anything he wants. Ambulating with no SOB. Denies orthopnea/edema or CP. Wearing CPAP at night. Recently INR up to about 4.0. Says he was eating greens more. INR on 03/15/13 was 3.0. No bleeding or dark stools, no dark urine or hematuria.  .Denies LVAD alarms.  Denies driveline trauma, erythema or drainage.  Denies ICD shocks.    Weighing every day. Takes lasix only if weight 227 or greater. (takes lasix 40 mg at that time). Has done it about 4x over past 2 weeks. Gets wiped out after he takes the lasix.    Past Medical History  Diagnosis Date  . Ischemic cardiomyopathy      CABG 2003, PCI 2007  EF 27%(myoview 2012  . CHF (congestive heart failure)     class I-II  . Sleep apnea     ?  [name] Intestine disorder     blockage  . ICD (implantable cardiac defibrillator) in place   . Hyperlipidemia   . Hypothyroidism   . Chronic anticoagulation   . Obesity   . COPD (chronic obstructive pulmonary disease)   . Asbestosis(501)   . Atrial fibrillation     paroxysmal; on coumadin  . Paroxysmal ventricular tachycardia     has ICD in place  . Coronary artery disease     Current Outpatient Prescriptions  Medication Sig Dispense Refill  . albuterol (PROVENTIL) (2.5 MG/3ML) 0.083% nebulizer solution Take 2.5 mg by nebulization every 6 (six) hours as needed.       [name] aspirin 81 MG tablet Take 81 mg by mouth daily.      . budesonide-formoterol (SYMBICORT) 160-4.5 MCG/ACT inhaler Inhale 2 puffs into the lungs 2 (two) times daily.      .  citalopram (CELEXA) 40 MG tablet Take 1 tablet (40 mg total) by mouth daily.  30 tablet  3  . docusate sodium (COLACE) 100 MG capsule Take 200 mg by mouth at bedtime.       . ferrous fumarate-b12-vitamic C-folic acid (TRINSICON / FOLTRIN) capsule Take 1 capsule by mouth 3 (three) times daily after meals.  90 capsule  3  . furosemide (LASIX) 40 MG tablet Take 1 tablet (40 mg total) by mouth daily. IF Weight over 226 take extra lasix  45 tablet  6  . levothyroxine (SYNTHROID, LEVOTHROID) 100 MCG tablet Take 50 mcg by mouth at bedtime.       . metoprolol succinate (TOPROL-XL) 25 MG 24 hr tablet Take 1 tablet (25 mg total) by mouth daily.  30 tablet  3  . nitroGLYCERIN (NITROSTAT) 0.4 MG SL tablet Place 0.4 mg under the tongue every 5 (five) minutes as needed.        . pantoprazole (PROTONIX) 40 MG tablet Take 1 tablet (40 mg total) by mouth daily.  30 tablet  3  . potassium chloride SA (K-DUR,KLOR-CON) 20 MEQ tablet Take 1 tablet (20 mEq total) by mouth 2 (two) times daily.  60 tablet  3  . simvastatin (ZOCOR) 80 MG tablet Take 40 mg by mouth  at bedtime.        [name] tiotropium (SPIRIVA) 18 MCG inhalation capsule Place 18 mcg into inhaler and inhale daily.       . traMADol (ULTRAM) 50 MG tablet Take 1 tablet (50 mg total) by mouth every 6 (six) hours as needed.  30 tablet  3  . warfarin (COUMADIN) 5 MG tablet Take 1 tablet (5 mg total) by mouth daily. As directed  45 tablet  3   No current facility-administered medications for this encounter.    Review of patient's allergies indicates no known allergies.  REVIEW OF SYSTEMS: All systems negative except as listed in HPI, PMH and Problem list.   LVAD INTERROGATION:   HeartMate II LVAD:  Flow 4.7 liters/min, speed 9200, power 5.2, PI 6.4.  Back up speed set to 8600. 1-2 PI events/day  I reviewed the LVAD parameters from today, and compared the results to the patient's prior recorded data.  No programming changes were made.  The LVAD is functioning  within specified parameters.  The patient performs LVAD self-test daily.  LVAD interrogation was negative for any significant power changes, alarms or PI events/speed drops.  LVAD equipment check completed and is in good working order.  Back-up equipment present.   LVAD education done on emergency procedures and precautions and reviewed exit site care.   Physical Exam: Filed Vitals:   03/19/13 1114  Pulse: 98  Weight: 226 lb 12.8 oz (102.876 kg)  SpO2: 97%  Last weight: 225. lbs  GENERAL: Well appearing, male who presents to clinic today in NAD. HEENT: normal  NECK: Supple, JVP flat;  2+ bilaterally, no bruits.  No lymphadenopathy or thyromegaly appreciated.   CARDIAC: LVAD hum present.  LUNGS:  Clear to auscultation bilaterally.  ABDOMEN:  Soft, round, nontender, positive bowel sounds x4.     LVAD exit site: semi incorporated;  Dressing dry and intact; No drainage.  Stabilization device present and accurately applied.  Driveline dressing is being changed daily per sterile technique. Changed in clinic EXTREMITIES:  Warm and dry, no cyanosis, clubbing, rash or edema  NEUROLOGIC:  Alert and oriented x 4.  Gait steady.  No aphasia.  No dysarthria.  Affect pleasant.       ASSESSMENT AND PLAN:   1. Status post LVAD implantation - Patient comes to clinic today for routine follow up visit. Currently has NYHA class II symptoms on LVAD support.   2.  Anticoagulation management - INR goal 2.0-3.0.  Will evaluate INR value today and follow up with patient for necessary changes.

## 2013-03-19 NOTE — Assessment & Plan Note (Signed)
INR back down. Will recheck INR (and other labs) today. Adjust as needed. Drive line looks great. Device interrogated. Occasional PI events - stable.

## 2013-03-19 NOTE — Assessment & Plan Note (Signed)
Doing great with VAD. Weight a bit a variable. He is considering switching from prn lasix to lasix 20 mg every other day. Continue to adjust as needed. MAPs pretty good. Continue current regimen.

## 2013-03-22 ENCOUNTER — Ambulatory Visit (INDEPENDENT_AMBULATORY_CARE_PROVIDER_SITE_OTHER): Payer: Medicare Other | Admitting: *Deleted

## 2013-03-22 ENCOUNTER — Encounter: Payer: Self-pay | Admitting: Internal Medicine

## 2013-03-22 DIAGNOSIS — I255 Ischemic cardiomyopathy: Secondary | ICD-10-CM

## 2013-03-22 DIAGNOSIS — Z95810 Presence of automatic (implantable) cardiac defibrillator: Secondary | ICD-10-CM

## 2013-03-22 DIAGNOSIS — I2589 Other forms of chronic ischemic heart disease: Secondary | ICD-10-CM

## 2013-03-30 LAB — REMOTE ICD DEVICE
BATTERY VOLTAGE: 3.0912 V
CHARGE TIME: 9.549 s
RV LEAD AMPLITUDE: 5.5 mv
RV LEAD IMPEDENCE ICD: 361 Ohm
TZAT-0001FASTVT: 1
TZAT-0001SLOWVT: 1
TZAT-0001SLOWVT: 2
TZAT-0002FASTVT: NEGATIVE
TZAT-0004SLOWVT: 8
TZAT-0004SLOWVT: 8
TZAT-0012FASTVT: 200 ms
TZAT-0013SLOWVT: 3
TZAT-0013SLOWVT: 3
TZAT-0018FASTVT: NEGATIVE
TZAT-0018SLOWVT: NEGATIVE
TZAT-0019SLOWVT: 8 V
TZAT-0020SLOWVT: 1.5 ms
TZAT-0020SLOWVT: 1.5 ms
TZON-0003VSLOWVT: 410 ms
TZON-0004SLOWVT: 40
TZON-0004VSLOWVT: 48
TZST-0001FASTVT: 4
TZST-0001FASTVT: 6
TZST-0001SLOWVT: 3
TZST-0001SLOWVT: 5
TZST-0002FASTVT: NEGATIVE
TZST-0002FASTVT: NEGATIVE
TZST-0003SLOWVT: 30 J
VF: 0

## 2013-03-31 ENCOUNTER — Ambulatory Visit (HOSPITAL_COMMUNITY): Payer: Self-pay | Admitting: Anesthesiology

## 2013-03-31 ENCOUNTER — Other Ambulatory Visit: Payer: Medicare Other

## 2013-03-31 ENCOUNTER — Telehealth (HOSPITAL_COMMUNITY): Payer: Self-pay | Admitting: *Deleted

## 2013-03-31 DIAGNOSIS — Z95811 Presence of heart assist device: Secondary | ICD-10-CM

## 2013-03-31 DIAGNOSIS — I5022 Chronic systolic (congestive) heart failure: Secondary | ICD-10-CM

## 2013-03-31 DIAGNOSIS — I4891 Unspecified atrial fibrillation: Secondary | ICD-10-CM

## 2013-03-31 NOTE — Telephone Encounter (Signed)
INR today per Ulla Potash, NP pt will go to [facility]

## 2013-04-06 ENCOUNTER — Telehealth (HOSPITAL_COMMUNITY): Payer: Self-pay | Admitting: *Deleted

## 2013-04-06 DIAGNOSIS — I5022 Chronic systolic (congestive) heart failure: Secondary | ICD-10-CM

## 2013-04-06 NOTE — Telephone Encounter (Signed)
Per Ulla Potash, NP pt needs INR tomorrow, pt is going to [facility], order placed

## 2013-04-07 ENCOUNTER — Encounter: Payer: Self-pay | Admitting: *Deleted

## 2013-04-07 ENCOUNTER — Other Ambulatory Visit: Payer: Medicare Other

## 2013-04-08 ENCOUNTER — Other Ambulatory Visit: Payer: Medicare Other

## 2013-04-08 ENCOUNTER — Ambulatory Visit (HOSPITAL_COMMUNITY): Payer: Self-pay | Admitting: Anesthesiology

## 2013-04-08 DIAGNOSIS — I4891 Unspecified atrial fibrillation: Secondary | ICD-10-CM

## 2013-04-08 DIAGNOSIS — I5022 Chronic systolic (congestive) heart failure: Secondary | ICD-10-CM

## 2013-04-08 DIAGNOSIS — Z95811 Presence of heart assist device: Secondary | ICD-10-CM

## 2013-04-09 ENCOUNTER — Telehealth (HOSPITAL_COMMUNITY): Payer: Self-pay | Admitting: Internal Medicine

## 2013-04-09 NOTE — Telephone Encounter (Signed)
Got a call from patient's wife.Thia am bent over to pick up his dog and lost his balance and fell back and hit his head on the grass. He has been doing fine. No bump on his head. No HA or nausea. No neuro sx. I told her to continue to watch him. If develops lump on his head or any neuro sx (HA, focal deficit, n/v) she is to call 911 and bring to ER for CT as he is on coumadin for LVAD.

## 2013-04-21 ENCOUNTER — Ambulatory Visit (HOSPITAL_COMMUNITY): Payer: Self-pay | Admitting: Anesthesiology

## 2013-04-21 ENCOUNTER — Ambulatory Visit (HOSPITAL_COMMUNITY)
Admission: RE | Admit: 2013-04-21 | Discharge: 2013-04-21 | Disposition: A | Discharge status: Home / Self Care | Payer: Medicare Other | Source: Ambulatory Visit | Attending: Cardiology | Admitting: Cardiology

## 2013-04-21 VITALS — BP 106/0 | HR 98 | Wt 229.0 lb

## 2013-04-21 DIAGNOSIS — I2510 Atherosclerotic heart disease of native coronary artery without angina pectoris: Secondary | ICD-10-CM | POA: Insufficient documentation

## 2013-04-21 DIAGNOSIS — Z95818 Presence of other cardiac implants and grafts: Secondary | ICD-10-CM

## 2013-04-21 DIAGNOSIS — I4891 Unspecified atrial fibrillation: Secondary | ICD-10-CM

## 2013-04-21 DIAGNOSIS — I5022 Chronic systolic (congestive) heart failure: Secondary | ICD-10-CM

## 2013-04-21 DIAGNOSIS — Z95811 Presence of heart assist device: Secondary | ICD-10-CM

## 2013-04-21 DIAGNOSIS — I10 Essential (primary) hypertension: Secondary | ICD-10-CM | POA: Insufficient documentation

## 2013-04-21 LAB — CBC
MCH: 30.7 pg (ref 26.0–34.0)
Platelets: 153 10*3/uL (ref 150–400)
RBC: 3.62 MIL/uL — ABNORMAL LOW (ref 4.22–5.81)
WBC: 6.6 10*3/uL (ref 4.0–10.5)

## 2013-04-21 LAB — PROTIME-INR: Prothrombin Time: 26 seconds — ABNORMAL HIGH (ref 11.6–15.2)

## 2013-04-21 LAB — COMPREHENSIVE METABOLIC PANEL
AST: 23 U/L (ref 0–37)
BUN: 18 mg/dL (ref 6–23)
CO2: 27 mEq/L (ref 19–32)
Chloride: 106 mEq/L (ref 96–112)
Creatinine, Ser: 0.94 mg/dL (ref 0.50–1.35)
GFR calc non Af Amer: 82 mL/min — ABNORMAL LOW (ref >90)
Glucose, Bld: 119 mg/dL — ABNORMAL HIGH (ref 70–99)
Total Bilirubin: 0.6 mg/dL (ref 0.3–1.2)

## 2013-04-21 MED ORDER — ASPIRIN EC 325 MG PO TBEC: 325.0000 mg | DELAYED_RELEASE_TABLET | Freq: Every day | ORAL | Status: DC

## 2013-04-21 MED ORDER — POTASSIUM CHLORIDE CRYS ER 20 MEQ PO TBCR: EXTENDED_RELEASE_TABLET | ORAL | Status: DC

## 2013-04-21 MED ORDER — METOPROLOL SUCCINATE ER 25 MG PO TB24: ORAL_TABLET | ORAL | Status: DC

## 2013-04-21 MED ORDER — FUROSEMIDE 40 MG PO TABS: ORAL_TABLET | ORAL | Status: DC

## 2013-04-21 NOTE — Patient Instructions (Addendum)
1.  Increase Aspirin to 325 mg daily. Call if nosebleeds increase or become worse. 2.  Increase Lasix and potassium to take Monday, Wednesday, and Friday. 3.  Take Toprol one tablet (25 mg) in the evening as you have been, and add 1/2 tab (12.5 mg) in the mornings. Call if dizzy, lightheaded. 4.   Begin weekly dressing changes using weekly dressing kit as taught. 5.   Begin showering as instructed. Call if any change in drive line exit site. Change dressing weekly or if moisture under dressing window.

## 2013-04-21 NOTE — Progress Notes (Signed)
6 min walk test performed, pt was only able to walk for 5 min due to "legs weak" he did ambulate 800 ft.

## 2013-04-21 NOTE — Progress Notes (Addendum)
HPI:  [name] is a 72 year old gentleman with a history of CABG (2003), Chronic systolic HF EF 20-25% %, S/P AICD (generator change 2011), hyperlipidemia, hypothyroidism, emphysema, OSA, intermittent atrial fibrillation (on coumadin). Quit smoking 2003. Uses CPAP and O2 every night. He is a disabled [employer].   He is s/p LVAD HM II implanted 01/12/13  Follow up: Doing great and is enjoying camping with his family. Had a fall a couple of weeks back when he bent over he got dizzy, however has not had any other spells. Denies SOB, orthopnea, CP or edema. Taking medications as prescribed.  Denies LVAD alarms. Denies driveline trauma, erythema or drainage. Denies ICD shocks.  Reports taking Coumadin as prescribed and adherence to anticoagulation based dietary restrictions. Denies bright red blood per rectum or melena, no dark urine or hematuria   Symptom Yes No Details  Angina                 x Activity:  Claudication         x How far  Syncope         x When: pt reports "legs gave out" after bending over; fell and hit head "last week".   Stroke         x   Orthopnea         x How many pillows: 2 (comfort)  PND         x How often  CPAP         x  How many hrs:  All night  Pedal edema         x   Abd fullness         x   N&V         x   Diaphoresis         x when  Bleeding         x   SOB         x  Activity: incline; walking "too fast"  Palpitations         x when  ICD shock         x   Hospitlizaitons         x When/where/why  ED visit         x When/where/why  Other MD         x When/who/why  Activity      No formal exercise program; minimal household chores  Fluid      No limitations  Diet      No limitations    Past Medical History  Diagnosis Date  . Ischemic cardiomyopathy      CABG 2003, PCI 2007  EF 27%(myoview 2012  . CHF (congestive heart failure)     class I-II  . Sleep apnea     ?  [name] Intestine disorder     blockage  . ICD (implantable cardiac defibrillator) in  place   . Hyperlipidemia   . Hypothyroidism   . Chronic anticoagulation   . Obesity   . COPD (chronic obstructive pulmonary disease)   . Asbestosis(501)   . Atrial fibrillation     paroxysmal; on coumadin  . Paroxysmal ventricular tachycardia     has ICD in place  . Coronary artery disease     Current Outpatient Prescriptions  Medication Sig Dispense Refill  . albuterol (PROVENTIL) (2.5 MG/3ML) 0.083% nebulizer solution Take 2.5 mg by nebulization every 6 (six) hours as needed.       [name]  ascorbic acid (VITAMIN C) 500 MG tablet Take 500 mg by mouth 2 (two) times daily.      . budesonide-formoterol (SYMBICORT) 160-4.5 MCG/ACT inhaler Inhale 2 puffs into the lungs 2 (two) times daily.      . Cholecalciferol 10000 UNITS CAPS Take 2 capsules by mouth at bedtime.      . citalopram (CELEXA) 40 MG tablet Take 1 tablet (40 mg total) by mouth daily.  30 tablet  3  . cyanocobalamin 1000 MCG tablet Take 1,000 mcg by mouth daily.      [name] docusate sodium (COLACE) 100 MG capsule Take 200 mg by mouth at bedtime.       . ferrous fumarate-b12-vitamic C-folic acid (TRINSICON / FOLTRIN) capsule Take 1 capsule by mouth 3 (three) times daily after meals.  90 capsule  3  . furosemide (LASIX) 40 MG tablet Take Monday, Wednesday, Friday  45 tablet  6  . levothyroxine (SYNTHROID, LEVOTHROID) 100 MCG tablet Take 50 mcg by mouth at bedtime.       . metoprolol succinate (TOPROL-XL) 25 MG 24 hr tablet Take one tab each evening; take 1/2 tablet in the morning  45 tablet  3  . pantoprazole (PROTONIX) 40 MG tablet Take 1 tablet (40 mg total) by mouth daily.  30 tablet  3  . potassium chloride SA (K-DUR,KLOR-CON) 20 MEQ tablet Take Monday, Wednesday, and Friday with Lasix dose  60 tablet  3  . simvastatin (ZOCOR) 80 MG tablet Take 40 mg by mouth at bedtime.        [name] tiotropium (SPIRIVA) 18 MCG inhalation capsule Place 18 mcg into inhaler and inhale daily.       [name] warfarin (COUMADIN) 5 MG tablet Take 1 tablet (5 mg total)  by mouth daily. As directed  45 tablet  3  . aspirin EC 325 MG tablet Take 1 tablet (325 mg total) by mouth daily.  30 tablet  0  . nitroGLYCERIN (NITROSTAT) 0.4 MG SL tablet Place 0.4 mg under the tongue every 5 (five) minutes as needed.        . traMADol (ULTRAM) 50 MG tablet Take 1 tablet (50 mg total) by mouth every 6 (six) hours as needed.  30 tablet  3   No current facility-administered medications for this encounter.    Review of patient's allergies indicates no known allergies.  REVIEW OF SYSTEMS: All systems negative except as listed in HPI, PMH and Problem list.   LVAD INTERROGATION:   HeartMate II LVAD:  Flow 7.7liters/min, speed 9190, power 6.4, PI 4.6  Back-up Speed: 8600  I reviewed the LVAD parameters from today, and compared the results to the patient's prior recorded data.  No programming changes were made.  The LVAD is functioning within specified parameters.  The patient performs LVAD self-test daily.  LVAD interrogation was negative for any significant power changes or alarms. There were a few PI events daily, but nothing concerning and none back to back. LVAD equipment check completed and is in good working order.  Back-up equipment present.   LVAD education done on emergency procedures and precautions and reviewed exit site care.  Physical Exam: Filed Vitals:   04/21/13 0922  BP: 106/0  Pulse: 98  Weight: 229 lb (103.874 kg)  SpO2: 96%    GENERAL: Well appearing, male; NAD; wife present HEENT: normal  NECK: Supple, JVP 8-9. 2+ bilaterally, no bruits.  No lymphadenopathy or thyromegaly appreciated.   CARDIAC:  Mechanical heart sounds with LVAD hum present.  LUNGS:  Clear to auscultation bilaterally.  ABDOMEN:  Soft, round, nontender, positive bowel sounds x4.     LVAD exit site: well-healed and incorporated.  Dressing dry and intact.  No erythema, drainage, odor or tenderness.  Stabilization device present and accurately applied.  Driveline dressing is being  changed daily per sterile technique. Changed in clinic EXTREMITIES:  Warm and dry, no cyanosis, clubbing, rash or 2+ edema  NEUROLOGIC:  Alert and oriented x 4.  Gait steady.  No aphasia.  No dysarthria.  Affect pleasant.      ASSESSMENT AND PLAN:   1) Chronic Systolic HF, s/p LVAD implant 10/6107 - Patient is currently having NYHA II/III symptoms. I believe that some of  his SOB with exertion is r/t his pulmonary disease and is not cardiac related. - His fluid status is mildly elevated will increase lasix to 40 mg TID, Monday, Wed, and Fri along with taking his Potassium 20 meq on the days he takes his diuretic. - MAP elevated 106, will increase Toprol to 12.5 mg am and 25 mg pm. Instructed to call any increased dizziness. - BMET stable. - 6 min walk test today and was able to walk 800 ft in 5 min, had to stop d/t leg fatigue  2)  Anticoagulation management - INR goal 2.0-3.0. Patient has PAF.  - On Coumadin and ASA 81 mg, will increase ASA to 325 mg. - INR 2.4 continue current dose and follow up in 2 weeks.  3) HTN - MAP increased follow plan as above increase Toprol to 12.5/25mg   4) CAD - stable denies any CP - Continue BB, ACE-I and ASA  5) LVAD- - Doing great and no alarms. Parameters within normal range. Will check [facility] today. - Completed 3 mos Intermacs KCCQ-12, QOL, and neurocognitive assessment with VAD Coordinator. - Instructed to advance to weekly dressing changes using weekly dressing kit; education and demonstration provided using weekly dressing kit. Wife verbalized understanding of same. - Pt may begin showers; education and demonstration provided using pocket controller shower kit; pt and wife verbalized understanding  - Follow up 6 weeks.  Ulla Potash B NP-C 7:06 PM

## 2013-04-21 NOTE — Addendum Note (Signed)
Encounter addended by: Aundria Rud, NP on: 04/21/2013  7:12 PM<BR>     Documentation filed: Problem List, Notes Section

## 2013-05-05 ENCOUNTER — Other Ambulatory Visit: Payer: Self-pay

## 2013-05-07 ENCOUNTER — Other Ambulatory Visit (HOSPITAL_COMMUNITY): Payer: Self-pay | Admitting: Anesthesiology

## 2013-05-07 DIAGNOSIS — I5022 Chronic systolic (congestive) heart failure: Secondary | ICD-10-CM

## 2013-05-10 ENCOUNTER — Other Ambulatory Visit: Payer: Self-pay | Admitting: *Deleted

## 2013-05-10 ENCOUNTER — Ambulatory Visit: Payer: Self-pay | Admitting: *Deleted

## 2013-05-10 ENCOUNTER — Ambulatory Visit (INDEPENDENT_AMBULATORY_CARE_PROVIDER_SITE_OTHER): Payer: Medicare Other | Admitting: *Deleted

## 2013-05-10 DIAGNOSIS — I5041 Acute combined systolic (congestive) and diastolic (congestive) heart failure: Secondary | ICD-10-CM

## 2013-05-10 DIAGNOSIS — Z7901 Long term (current) use of anticoagulants: Secondary | ICD-10-CM

## 2013-05-10 DIAGNOSIS — I4891 Unspecified atrial fibrillation: Secondary | ICD-10-CM

## 2013-05-10 DIAGNOSIS — Z95811 Presence of heart assist device: Secondary | ICD-10-CM

## 2013-05-10 DIAGNOSIS — I509 Heart failure, unspecified: Secondary | ICD-10-CM

## 2013-05-19 ENCOUNTER — Other Ambulatory Visit: Payer: Self-pay | Admitting: *Deleted

## 2013-05-19 DIAGNOSIS — Z95811 Presence of heart assist device: Secondary | ICD-10-CM

## 2013-05-19 DIAGNOSIS — Z7901 Long term (current) use of anticoagulants: Secondary | ICD-10-CM

## 2013-05-24 ENCOUNTER — Observation Stay (HOSPITAL_COMMUNITY)
Admission: EM | Admit: 2013-05-24 | Discharge: 2013-05-26 | Disposition: A | Discharge status: Home / Self Care | Payer: Medicare Other | Attending: Internal Medicine | Admitting: Internal Medicine

## 2013-05-24 ENCOUNTER — Other Ambulatory Visit: Payer: Medicare Other

## 2013-05-24 ENCOUNTER — Emergency Department (HOSPITAL_COMMUNITY): Payer: Medicare Other

## 2013-05-24 ENCOUNTER — Encounter (HOSPITAL_COMMUNITY): Payer: Self-pay | Admitting: *Deleted

## 2013-05-24 ENCOUNTER — Telehealth (HOSPITAL_COMMUNITY): Payer: Self-pay | Admitting: *Deleted

## 2013-05-24 DIAGNOSIS — Z006 Encounter for examination for normal comparison and control in clinical research program: Secondary | ICD-10-CM | POA: Insufficient documentation

## 2013-05-24 DIAGNOSIS — Z950 Presence of cardiac pacemaker: Secondary | ICD-10-CM | POA: Insufficient documentation

## 2013-05-24 DIAGNOSIS — I2510 Atherosclerotic heart disease of native coronary artery without angina pectoris: Secondary | ICD-10-CM | POA: Insufficient documentation

## 2013-05-24 DIAGNOSIS — I509 Heart failure, unspecified: Secondary | ICD-10-CM | POA: Insufficient documentation

## 2013-05-24 DIAGNOSIS — G459 Transient cerebral ischemic attack, unspecified: Secondary | ICD-10-CM

## 2013-05-24 DIAGNOSIS — D509 Iron deficiency anemia, unspecified: Secondary | ICD-10-CM

## 2013-05-24 DIAGNOSIS — Z951 Presence of aortocoronary bypass graft: Secondary | ICD-10-CM | POA: Insufficient documentation

## 2013-05-24 DIAGNOSIS — Z79899 Other long term (current) drug therapy: Secondary | ICD-10-CM | POA: Insufficient documentation

## 2013-05-24 DIAGNOSIS — E669 Obesity, unspecified: Secondary | ICD-10-CM | POA: Insufficient documentation

## 2013-05-24 DIAGNOSIS — I4891 Unspecified atrial fibrillation: Secondary | ICD-10-CM | POA: Insufficient documentation

## 2013-05-24 DIAGNOSIS — Z7901 Long term (current) use of anticoagulants: Secondary | ICD-10-CM | POA: Insufficient documentation

## 2013-05-24 DIAGNOSIS — J61 Pneumoconiosis due to asbestos and other mineral fibers: Secondary | ICD-10-CM | POA: Insufficient documentation

## 2013-05-24 DIAGNOSIS — R4182 Altered mental status, unspecified: Principal | ICD-10-CM | POA: Diagnosis present

## 2013-05-24 DIAGNOSIS — E785 Hyperlipidemia, unspecified: Secondary | ICD-10-CM | POA: Insufficient documentation

## 2013-05-24 DIAGNOSIS — Z95818 Presence of other cardiac implants and grafts: Secondary | ICD-10-CM

## 2013-05-24 DIAGNOSIS — Z87891 Personal history of nicotine dependence: Secondary | ICD-10-CM | POA: Insufficient documentation

## 2013-05-24 DIAGNOSIS — J449 Chronic obstructive pulmonary disease, unspecified: Secondary | ICD-10-CM | POA: Insufficient documentation

## 2013-05-24 DIAGNOSIS — I5022 Chronic systolic (congestive) heart failure: Secondary | ICD-10-CM | POA: Diagnosis present

## 2013-05-24 DIAGNOSIS — Z7982 Long term (current) use of aspirin: Secondary | ICD-10-CM | POA: Insufficient documentation

## 2013-05-24 DIAGNOSIS — I2589 Other forms of chronic ischemic heart disease: Secondary | ICD-10-CM | POA: Insufficient documentation

## 2013-05-24 DIAGNOSIS — E039 Hypothyroidism, unspecified: Secondary | ICD-10-CM | POA: Insufficient documentation

## 2013-05-24 DIAGNOSIS — J4489 Other specified chronic obstructive pulmonary disease: Secondary | ICD-10-CM | POA: Insufficient documentation

## 2013-05-24 DIAGNOSIS — Z95811 Presence of heart assist device: Secondary | ICD-10-CM

## 2013-05-24 HISTORY — DX: Essential (primary) hypertension: I10

## 2013-05-24 HISTORY — DX: Acute myocardial infarction, unspecified: I21.9

## 2013-05-24 HISTORY — DX: Dependence on supplemental oxygen: Z99.81

## 2013-05-24 HISTORY — DX: Dependence on other enabling machines and devices: Z99.89

## 2013-05-24 HISTORY — DX: Depression, unspecified: F32.A

## 2013-05-24 HISTORY — DX: Obstructive sleep apnea (adult) (pediatric): G47.33

## 2013-05-24 HISTORY — DX: Major depressive disorder, single episode, unspecified: F32.9

## 2013-05-24 LAB — HEPATIC FUNCTION PANEL
ALT: 8 U/L (ref 0–53)
AST: 23 U/L (ref 0–37)
Alkaline Phosphatase: 58 U/L (ref 39–117)
Total Protein: 6.7 g/dL (ref 6.0–8.3)

## 2013-05-24 LAB — CBC WITH DIFFERENTIAL/PLATELET
Basophils Absolute: 0 10*3/uL (ref 0.0–0.1)
Eosinophils Absolute: 0.2 10*3/uL (ref 0.0–0.7)
Eosinophils Relative: 3 % (ref 0–5)
HCT: 31.6 % — ABNORMAL LOW (ref 39.0–52.0)
Lymphocytes Relative: 14 % (ref 12–46)
Lymphs Abs: 1.2 10*3/uL (ref 0.7–4.0)
MCH: 28.1 pg (ref 26.0–34.0)
MCV: 94.3 fL (ref 78.0–100.0)
Monocytes Absolute: 0.7 10*3/uL (ref 0.1–1.0)
Platelets: 155 10*3/uL (ref 150–400)
RDW: 19.1 % — ABNORMAL HIGH (ref 11.5–15.5)

## 2013-05-24 LAB — PROTIME-INR
INR: 2.66 — ABNORMAL HIGH (ref 0.00–1.49)
Prothrombin Time: 25.8 seconds — ABNORMAL HIGH (ref 11.6–15.2)
Prothrombin Time: 27.4 seconds — ABNORMAL HIGH (ref 11.6–15.2)

## 2013-05-24 LAB — BASIC METABOLIC PANEL
CO2: 25 mEq/L (ref 19–32)
Calcium: 9.7 mg/dL (ref 8.4–10.5)
Creatinine, Ser: 0.89 mg/dL (ref 0.50–1.35)
GFR calc non Af Amer: 84 mL/min — ABNORMAL LOW (ref >90)
Glucose, Bld: 125 mg/dL — ABNORMAL HIGH (ref 70–99)
Sodium: 138 mEq/L (ref 135–145)

## 2013-05-24 LAB — APTT: aPTT: 46 seconds — ABNORMAL HIGH (ref 24–37)

## 2013-05-24 MED ORDER — ASPIRIN EC 325 MG PO TBEC
325.0000 mg | DELAYED_RELEASE_TABLET | Freq: Every day | ORAL | Status: DC
  Administered 2013-05-24 – 2013-05-25 (×2): 325 mg via ORAL
  Filled 2013-05-24 (×3): qty 1

## 2013-05-24 MED ORDER — DOCUSATE SODIUM 100 MG PO CAPS
200.0000 mg | ORAL_CAPSULE | Freq: Every day | ORAL | Status: DC
  Administered 2013-05-25 (×2): 200 mg via ORAL
  Filled 2013-05-24 (×3): qty 2

## 2013-05-24 MED ORDER — METOPROLOL SUCCINATE 12.5 MG HALF TABLET
12.5000 mg | ORAL_TABLET | Freq: Every day | ORAL | Status: DC
  Filled 2013-05-24: qty 1

## 2013-05-24 MED ORDER — VITAMIN C 500 MG PO TABS
500.0000 mg | ORAL_TABLET | Freq: Two times a day (BID) | ORAL | Status: DC
  Administered 2013-05-24 – 2013-05-26 (×4): 500 mg via ORAL
  Filled 2013-05-24 (×5): qty 1

## 2013-05-24 MED ORDER — ATORVASTATIN CALCIUM 40 MG PO TABS
40.0000 mg | ORAL_TABLET | Freq: Every day | ORAL | Status: DC
  Administered 2013-05-24 – 2013-05-25 (×2): 40 mg via ORAL
  Filled 2013-05-24 (×3): qty 1

## 2013-05-24 MED ORDER — WARFARIN SODIUM 5 MG PO TABS: 5.0000 mg | ORAL_TABLET | Freq: Every morning | ORAL | Status: DC

## 2013-05-24 MED ORDER — FUROSEMIDE 40 MG PO TABS: 40.0000 mg | ORAL_TABLET | ORAL | Status: DC

## 2013-05-24 MED ORDER — WARFARIN - PHARMACIST DOSING INPATIENT
Freq: Every day | Status: DC
  Administered 2013-05-25: 18:00:00

## 2013-05-24 MED ORDER — CHOLECALCIFEROL 250 MCG (10000 UT) PO CAPS: 2.0000 | ORAL_CAPSULE | Freq: Every day | ORAL | Status: DC

## 2013-05-24 MED ORDER — WARFARIN SODIUM 5 MG PO TABS
5.0000 mg | ORAL_TABLET | ORAL | Status: AC
  Administered 2013-05-25: 5 mg via ORAL
  Filled 2013-05-24 (×2): qty 1

## 2013-05-24 MED ORDER — TIOTROPIUM BROMIDE MONOHYDRATE 18 MCG IN CAPS
18.0000 ug | ORAL_CAPSULE | Freq: Every day | RESPIRATORY_TRACT | Status: DC
  Administered 2013-05-25 – 2013-05-26 (×2): 18 ug via RESPIRATORY_TRACT
  Filled 2013-05-24: qty 5

## 2013-05-24 MED ORDER — BUDESONIDE-FORMOTEROL FUMARATE 160-4.5 MCG/ACT IN AERO
2.0000 | INHALATION_SPRAY | Freq: Two times a day (BID) | RESPIRATORY_TRACT | Status: DC
  Administered 2013-05-25 – 2013-05-26 (×3): 2 via RESPIRATORY_TRACT
  Filled 2013-05-24 (×4): qty 6

## 2013-05-24 MED ORDER — METOPROLOL SUCCINATE ER 25 MG PO TB24: 6.2500 mg | ORAL_TABLET | Freq: Two times a day (BID) | ORAL | Status: DC

## 2013-05-24 MED ORDER — TRAMADOL HCL 50 MG PO TABS: 50.0000 mg | ORAL_TABLET | Freq: Four times a day (QID) | ORAL | Status: DC | PRN

## 2013-05-24 MED ORDER — METOPROLOL TARTRATE 25 MG/10 ML ORAL SUSPENSION
6.2500 mg | Freq: Every day | ORAL | Status: DC
  Administered 2013-05-25 – 2013-05-26 (×2): 6.25 mg via ORAL
  Filled 2013-05-24 (×2): qty 2.5

## 2013-05-24 MED ORDER — TIOTROPIUM BROMIDE MONOHYDRATE 18 MCG IN CAPS: 18.0000 ug | ORAL_CAPSULE | Freq: Every day | RESPIRATORY_TRACT | Status: DC

## 2013-05-24 MED ORDER — SODIUM CHLORIDE 0.9 % IJ SOLN
3.0000 mL | Freq: Two times a day (BID) | INTRAMUSCULAR | Status: DC
  Administered 2013-05-25 – 2013-05-26 (×3): 3 mL via INTRAVENOUS

## 2013-05-24 MED ORDER — SODIUM CHLORIDE 0.9 % IJ SOLN: 3.0000 mL | INTRAMUSCULAR | Status: DC | PRN

## 2013-05-24 MED ORDER — VITAMIN D3 25 MCG (1000 UNIT) PO TABS
2000.0000 [IU] | ORAL_TABLET | Freq: Every day | ORAL | Status: DC
  Administered 2013-05-24 – 2013-05-25 (×2): 2000 [IU] via ORAL
  Filled 2013-05-24 (×3): qty 2

## 2013-05-24 MED ORDER — CITALOPRAM HYDROBROMIDE 40 MG PO TABS
40.0000 mg | ORAL_TABLET | Freq: Every day | ORAL | Status: DC
  Administered 2013-05-24 – 2013-05-25 (×2): 40 mg via ORAL
  Filled 2013-05-24 (×3): qty 1

## 2013-05-24 MED ORDER — PANTOPRAZOLE SODIUM 40 MG PO TBEC
40.0000 mg | DELAYED_RELEASE_TABLET | Freq: Every day | ORAL | Status: DC
  Administered 2013-05-24 – 2013-05-25 (×2): 40 mg via ORAL
  Filled 2013-05-24 (×2): qty 1

## 2013-05-24 MED ORDER — METOPROLOL SUCCINATE 12.5 MG HALF TABLET
12.5000 mg | ORAL_TABLET | Freq: Once | ORAL | Status: AC
  Administered 2013-05-25: 12.5 mg via ORAL
  Filled 2013-05-24: qty 1

## 2013-05-24 MED ORDER — LEVOTHYROXINE SODIUM 50 MCG PO TABS
50.0000 ug | ORAL_TABLET | Freq: Every day | ORAL | Status: DC
  Administered 2013-05-25 – 2013-05-26 (×2): 50 ug via ORAL
  Filled 2013-05-24 (×3): qty 1

## 2013-05-24 MED ORDER — POTASSIUM CHLORIDE CRYS ER 20 MEQ PO TBCR
20.0000 meq | EXTENDED_RELEASE_TABLET | ORAL | Status: DC
  Administered 2013-05-24 – 2013-05-26 (×2): 20 meq via ORAL
  Filled 2013-05-24 (×3): qty 1

## 2013-05-24 MED ORDER — ALBUTEROL SULFATE (5 MG/ML) 0.5% IN NEBU: 2.5000 mg | INHALATION_SOLUTION | RESPIRATORY_TRACT | Status: DC | PRN

## 2013-05-24 MED ORDER — SODIUM CHLORIDE 0.9 % IV SOLN: 250.0000 mL | INTRAVENOUS | Status: DC | PRN

## 2013-05-24 MED ORDER — VITAMIN B-12 1000 MCG PO TABS
1000.0000 ug | ORAL_TABLET | Freq: Every day | ORAL | Status: DC
  Administered 2013-05-24 – 2013-05-25 (×2): 1000 ug via ORAL
  Filled 2013-05-24 (×3): qty 1

## 2013-05-24 MED ORDER — FUROSEMIDE 40 MG PO TABS
40.0000 mg | ORAL_TABLET | ORAL | Status: DC
  Filled 2013-05-24: qty 1

## 2013-05-24 MED ORDER — FE FUMARATE-B12-VIT C-FA-IFC PO CAPS
1.0000 | ORAL_CAPSULE | Freq: Two times a day (BID) | ORAL | Status: DC
  Administered 2013-05-25 – 2013-05-26 (×4): 1 via ORAL
  Filled 2013-05-24 (×5): qty 1

## 2013-05-24 NOTE — ED Notes (Signed)
Rapid Response nurse to help this RN take the pt upstairs. Report has been called.

## 2013-05-24 NOTE — Telephone Encounter (Signed)
Pt's wife called concerned about pt, she states for the past hour he has been confused and feels like something is wrong, she states he is moving all extremities and is talking normally, she states his left eye lid looks a though it may have a slight droop, she will bring pt to ER now per Ulla Potash, NP

## 2013-05-24 NOTE — Consult Note (Signed)
ANTICOAGULATION CONSULT NOTE - Initial Consult  Pharmacy Consult for Coumdain Indication: LVAD  No Known Allergies  Patient Measurements: Weight: 230 lb (104.327 kg)  Vital Signs: Temp: 97.7 F (36.5 C) (08/25 1522) Temp src: Oral (08/25 1522)  Labs:  Recent Labs  05/24/13 1550 05/24/13 1552  HGB 9.4*  --   HCT 31.6*  --   PLT 155  --   APTT  --  46*  LABPROT 25.8*  --   INR 2.45*  --   CREATININE 0.89  --   TROPONINI  --  <0.30    The CrCl is unknown because both a height and weight (above a minimum accepted value) are required for this calculation.   Medical History: Past Medical History  Diagnosis Date  . Ischemic cardiomyopathy      CABG 2003, PCI 2007  EF 27%(myoview 2012  . CHF (congestive heart failure)     class I-II  . Sleep apnea     ?  [name] Intestine disorder     blockage  . ICD (implantable cardiac defibrillator) in place   . Hyperlipidemia   . Hypothyroidism   . Chronic anticoagulation   . Obesity   . COPD (chronic obstructive pulmonary disease)   . Asbestosis(501)   . Atrial fibrillation     paroxysmal; on coumadin  . Paroxysmal ventricular tachycardia     has ICD in place  . Coronary artery disease    Assessment: 72yom s/p LVAD implantation 01/12/13 presents to the ED with a one hour period of confusion - ?TIA related to transient thrombus from the pump vs afib. He is on coumadin for his LVAD and will continue this while inpatient. INR is therapeutic at 2.45.   Home dose = 5mg  daily and patient says he did not take his dose yet today.  Goal of Therapy:  INR 2-3 Monitor platelets by anticoagulation protocol: Yes   Plan:  1) Coumadin 5mg  x 1 tonight 2) Daily INR  Fredrik Rigger 05/24/2013,6:33 PM

## 2013-05-24 NOTE — ED Notes (Signed)
Pt to ED for evaluation of confusion this morning.  Pt is LVAD pt, called LVAD nurse and reported symptoms, nurse instructed pt to come to ED- nurse waiting for him upon his arrival.  LVAD pack and extra batteries with pt.  Pt states that this morning he was feeling forgetful- did not remember riding in car to pick up friend or things that he told his wife.  Upon arrival to ED pt denies any complaints and states symptoms have resolved.  Denies any chest pain, shortness of breath.  Pt on cardiac monitor, LVAD machine at bedside- rapid response nurse aware.

## 2013-05-24 NOTE — H&P (Addendum)
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  Has been doing very well with VAD therapy. Today developed about a one hour period of confusion during which he couldn't remember where he was or figure things out. Denies any other associated neuro deficits - no focal weakness, HA, visual changes, slurring speech, etc. Also no fever, chills, dysuria. Denies any ETOH or substance use. Symptoms resolved spontaneously and no back to baseline. Head CT in ER negative. Carotid u/s in 4/14 was normal. Denies bleeding.   INR therapeutic at 2.45 LDH (used to assess for pump thrombosis) only minimally elevated  Review of Systems: [y] = yes, [ ]  = no   General: Weight gain [ ] ; Weight loss [ ] ; Anorexia [ ] ; Fatigue [ ] ; Fever [ ] ; Chills [ ] ; Weakness [ ]   Cardiac: Chest pain/pressure [ ] ; Resting SOB [ ] ; Exertional SOB [ ] ; Orthopnea [ ] ; Pedal Edema [ ] ; Palpitations [ ] ; Syncope [ ] ; Presyncope [ ] ; Paroxysmal nocturnal dyspnea[ ]   Pulmonary: Cough [ ] ; Wheezing[ ] ; Hemoptysis[ ] ; Sputum [ ] ; Snoring [ ]   GI: Vomiting[ ] ; Dysphagia[ ] ; Melena[ ] ; Hematochezia [ ] ; Heartburn[ ] ; Abdominal pain [ ] ; Constipation [ ] ; Diarrhea [ ] ; BRBPR [ ]   GU: Hematuria[ ] ; Dysuria [ ] ; Nocturia[ ]   Vascular: Pain in legs with walking [ ] ; Pain in feet with lying flat [ ] ; Non-healing sores [ ] ; Stroke [ ] ; TIA [ y]; Slurred speech [ ] ;  Neuro: Headaches[ ] ; Vertigo[ ] ; Seizures[ ] ; Paresthesias[ ] ;Blurred vision [ ] ; Diplopia [ ] ; Vision changes [ ]   Ortho/Skin: Arthritis [ ] ; Joint pain [ y;] Muscle pain [ ] ; Joint swelling [ ] ; Back Pain [ ] ; Rash [ ]   Psych: Depression[ ] ; Anxiety[ ]   Heme: Bleeding problems [ ] ; Clotting disorders [ ] ; Anemia [ ]   Endocrine: Diabetes  [ ] ; Thyroid dysfunction[ ]   Home Medications Prior to Admission medications   Medication Sig Start Date End Date Taking? Authorizing Provider  albuterol (PROVENTIL) (2.5 MG/3ML) 0.083% nebulizer solution Take 2.5 mg by nebulization every 6 (six) hours as needed for wheezing or shortness of breath.    Yes Historical Provider, MD  ascorbic acid (VITAMIN C) 500 MG tablet Take 500 mg by mouth 2 (two) times daily.   Yes Historical Provider, MD  aspirin EC 325 MG tablet Take 325 mg by mouth at bedtime.   Yes Historical Provider, MD  budesonide-formoterol (SYMBICORT) 160-4.5 MCG/ACT inhaler Inhale 2 puffs into the lungs 2 (two) times daily.   Yes Historical Provider, MD  Cholecalciferol 10000 UNITS CAPS Take 2 capsules by mouth at bedtime.   Yes Historical Provider, MD  citalopram (CELEXA) 40 MG tablet Take 40 mg by mouth at bedtime.   Yes Historical Provider, MD  cyanocobalamin 1000 MCG tablet Take 1,000 mcg by mouth at bedtime.    Yes Historical Provider, MD  docusate sodium (COLACE) 100 MG capsule Take 200 mg by mouth at bedtime.    Yes Historical Provider, MD  ferrous fumarate-b12-vitamic C-folic acid (TRINSICON / FOLTRIN) capsule Take 1 capsule by mouth 2 (two) times daily.   Yes Historical Provider, MD  furosemide (LASIX) 40 MG tablet Take 40 mg by mouth every  Monday, Wednesday, and Friday. Take with potassium   Yes Historical Provider, MD  levothyroxine (SYNTHROID, LEVOTHROID) 100 MCG tablet Take 50 mcg by mouth at bedtime.    Yes Historical Provider, MD  metoprolol succinate (TOPROL-XL) 25 MG 24 hr tablet Take 6.25-12.5 mg by mouth 2 (two) times daily. 1/4 tablet (6.25mg ) every morning and 1/2 tablet (12.5mg ) every evening   Yes Historical Provider, MD  nitroGLYCERIN (NITROSTAT) 0.4 MG SL tablet Place 0.4 mg under the tongue every 5 (five) minutes as needed.     Yes Historical Provider, MD  pantoprazole (PROTONIX) 40 MG tablet Take 40 mg by mouth at bedtime.   Yes Historical Provider, MD   potassium chloride SA (K-DUR,KLOR-CON) 20 MEQ tablet Take 20 mEq by mouth every Monday, Wednesday, and Friday. With furosemide   Yes Historical Provider, MD  simvastatin (ZOCOR) 80 MG tablet Take 40 mg by mouth at bedtime.     Yes Historical Provider, MD  tiotropium (SPIRIVA) 18 MCG inhalation capsule Place 18 mcg into inhaler and inhale daily.    Yes Historical Provider, MD  traMADol (ULTRAM) 50 MG tablet Take 50 mg by mouth every 6 (six) hours as needed for pain.   Yes Historical Provider, MD  warfarin (COUMADIN) 5 MG tablet Take 5 mg by mouth every morning.   Yes Historical Provider, MD    Past Medical History: Past Medical History  Diagnosis Date  . Ischemic cardiomyopathy      CABG 2003, PCI 2007  EF 27%(myoview 2012  . CHF (congestive heart failure)     class I-II  . Sleep apnea     ?  [name] Intestine disorder     blockage  . ICD (implantable cardiac defibrillator) in place   . Hyperlipidemia   . Hypothyroidism   . Chronic anticoagulation   . Obesity   . COPD (chronic obstructive pulmonary disease)   . Asbestosis(501)   . Atrial fibrillation     paroxysmal; on coumadin  . Paroxysmal ventricular tachycardia     has ICD in place  . Coronary artery disease     Past Surgical History: Past Surgical History  Procedure Laterality Date  . Coronary artery bypass graft    . Cardiac defibrillator placement    . Insertion of implantable left ventricular assist device N/A 01/12/2013    Procedure: INSERTION OF IMPLANTABLE LEFT VENTRICULAR ASSIST DEVICE;  Surgeon: Kerin Perna, MD;  Location: [facility] OR;  Service: Open Heart Surgery;  Laterality: N/A;   nitric oxide; Redo sternotomy  . Intraoperative transesophageal echocardiogram N/A 01/12/2013    Procedure: INTRAOPERATIVE TRANSESOPHAGEAL ECHOCARDIOGRAM;  Surgeon: Kerin Perna, MD;  [facility];  Service: Open Heart Surgery;  Laterality: N/A;    Family History: Family History  Problem Relation Age of Onset  . Heart attack  Mother   . Heart attack Father     Social History: History   Social History  . Marital Status: Married    Spouse Name: N/A    Number of Children: N/A  . Years of Education: N/A   Social History Main Topics  . Smoking status: Former Smoker -- 2.00 packs/day for 45 years    Types: Cigarettes    Quit date: 11/11/2001  . Smokeless tobacco: Not on file  . Alcohol Use: No  . Drug Use: No  . Sexual Activity: Not Currently   Other Topics Concern  . Not on file   Social History Narrative  . No narrative on file   LVAD INTERROGATION:  HeartMate II LVAD: Flow 7.3 liters/min, speed 9190, power 6.9, PI 5.2  Back-up Speed: 8600 Rare remote PI events. No recent power spikes.   I reviewed the LVAD parameters from today, and compared the results to the patient's prior recorded data. No programming changes were made. The LVAD is functioning within specified parameters. The patient performs LVAD self-test daily. LVAD interrogation was negative for any significant power changes or alarms. There were a few PI events daily, but nothing concerning and none back to back. LVAD equipment check completed and is in good working order. Back-up equipment present. LVAD education done on emergency procedures and precautions and reviewed exit site care.     Allergies:  No Known Allergies  Objective:    Vital Signs:   Temp:  [97.7 F (36.5 C)] 97.7 F (36.5 C) (08/25 1522) Resp:  [16] 16 (08/25 1522) SpO2:  [97 %] 97 % (08/25 1522) Weight:  [104.327 kg (230 lb)] 104.327 kg (230 lb) (08/25 1522)   Filed Weights   05/24/13 1522  Weight: 104.327 kg (230 lb)   GENERAL: Well appearing, male; NAD; wife present  HEENT: normal  NECK: Supple, JVP 7-8+ bilaterally, no bruits. No lymphadenopathy or thyromegaly appreciated.  CARDIAC: Mechanical heart sounds with LVAD hum present.  LUNGS: Clear to auscultation bilaterally.  ABDOMEN: Soft, round, nontender, positive bowel sounds x4.  LVAD exit site:  well-healed and incorporated. Dressing dry and intact. No erythema, drainage, odor or tenderness. Stabilization device present and accurately applied. Driveline dressing is being changed daily per sterile technique. Changed in clinic  EXTREMITIES: Warm and dry, no cyanosis, clubbing, rash or 2+ edema  NEUROLOGIC: Alert and oriented x 4. Gait steady. No aphasia. No dysarthria. Affect pleasant. No drift, FTN ok, no dysdiadochokinesis. Can count by 3s.Can spel WORLD forward and backwards. Can do calculations. Naming OK.    Telemetry: AF 80s  Labs: Basic Metabolic Panel:  Recent Labs Lab 05/24/13 1550  NA 138  K 3.7  CL 104  CO2 25  GLUCOSE 125*  BUN 17  CREATININE 0.89  CALCIUM 9.7    Liver Function Tests:  Recent Labs Lab 05/24/13 1552  AST 23  ALT 8  ALKPHOS 58  BILITOT 0.5  PROT 6.7  ALBUMIN 3.2*   No results found for this basename: LIPASE, AMYLASE,  in the last 168 hours No results found for this basename: AMMONIA,  in the last 168 hours  CBC:  Recent Labs Lab 05/24/13 1550  WBC 8.5  NEUTROABS 6.3  HGB 9.4*  HCT 31.6*  MCV 94.3  PLT 155    Cardiac Enzymes:  Recent Labs Lab 05/24/13 1552  TROPONINI <0.30    BNP: BNP (last 3 results)  Recent Labs  01/10/13 1048 01/13/13 0350 01/19/13 0515  PROBNP 763.5* 1080.0* 2665.0*    CBG: No results found for this basename: GLUCAP,  in the last 168 hours  Coagulation Studies:  Recent Labs  05/24/13 1550  LABPROT 25.8*  INR 2.45*    Other results: EKG: pending  Imaging: Ct Head Wo Contrast  05/24/2013   *RADIOLOGY REPORT*  Clinical Data: Confusion  CT HEAD WITHOUT CONTRAST  Technique:  Contiguous axial images were obtained from the base of the skull through the vertex without contrast.  Comparison: None.  Findings: No acute hemorrhage, acute infarction, or mass lesion is identified.  No midline shift.  No ventriculomegaly.  No skull fracture.  Chronic right maxillary sinusitis noted.   IMPRESSION: No acute intracranial finding.  Chronic right maxillary  sinusitis.   Original Report Authenticated By: Christiana Pellant, M.D.         Assessment:   1) Transient confusion, suspect TIA 2) Chronic systolic HF s/p LVAD 4/14       --pre-op carotid u/s was normal 3) CAD 4) Anemia 5) COPD 6) Chronic AF, rate controlled  Plan/Discussion:     I suspect he had a TIA likely related to transient thrombus from VAD (vs AF). Now back to baseline. No evidence of power spikes or increased LDH to suggest ongoing pump thrombosis. Unable to get MRI due to VAD and ICD. Will observe overnight and ask Neuro to see. If no further events and LDH stable in am we will send him home on current regimen. HF is well compensated. VAD parameters stable. Check UA.   Length of Stay: 0  Arvilla Meres MD 05/24/2013, 5:42 PM  Advanced Heart Failure Team Pager [phone] (M-F; 7a - 4p)  Please contact [facility] for night-coverage after hours (4p -7a ) and weekends on [email or web address]

## 2013-05-24 NOTE — ED Notes (Signed)
Dr. Bensimon at bedside.  

## 2013-05-24 NOTE — ED Notes (Signed)
PT spouse endorses confusion starting 30-45 min ago (out of character for this PT). LVAD in place [facility] RN with LVAD at bedside). Ambulatory at triage

## 2013-05-24 NOTE — ED Provider Notes (Signed)
CSN: [id number]     Arrival date & time 05/24/13  1514 History     First MD Initiated Contact with Patient 05/24/13 1527     Chief Complaint  Patient presents with  . Altered Mental Status   (Consider location/radiation/quality/duration/timing/severity/associated sxs/prior Treatment) HPI Pt had episode of confusion wear he was unable to remember recent events. Started roughly at 1430 today and lasted about an hour and has now resolved. No speech changes or focal weakness. No sensory or visual changes. Pt states he had been outside for most of the day prior to the event. Pt denies fever, chills, CP, SOB, cough, abd pain, N/V/D. Pt has LVAD and LVAD RN is at bedside.  Past Medical History  Diagnosis Date  . Ischemic cardiomyopathy      CABG 2003, PCI 2007  EF 27%(myoview 2012  . CHF (congestive heart failure)     class I-II  . Intestine disorder     blockage  . Hyperlipidemia   . Hypothyroidism   . Chronic anticoagulation   . Obesity   . COPD (chronic obstructive pulmonary disease)   . 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 Atrial fibrillation     paroxysmal; on coumadin  . Paroxysmal ventricular tachycardia     has ICD in place  . Coronary artery disease   . Automatic implantable cardioverter-defibrillator in situ   . Hypertension   . Myocardial infarction 1990's-2000    "2 in  ~ the 1990's; 1 in ~ 2000" (05/24/2013)  . Exertional shortness of breath   . OSA on CPAP   . On home oxygen therapy     "2L q hs" (05/24/2013)  . Depression    Past Surgical History  Procedure Laterality Date  . Coronary artery bypass graft  2003    "?X4" (05/24/2013)  . Cardiac defibrillator placement  2004; ~ 2010  . Insertion of implantable left ventricular assist device N/A 01/12/2013    Procedure: INSERTION OF IMPLANTABLE LEFT VENTRICULAR ASSIST DEVICE;  Surgeon: Kerin Perna, MD;  Location: [facility] OR;  Service: Open Heart Surgery;  Laterality: N/A;   nitric oxide; Redo  sternotomy  . Intraoperative transesophageal echocardiogram N/A 01/12/2013    Procedure: INTRAOPERATIVE TRANSESOPHAGEAL ECHOCARDIOGRAM;  Surgeon: Kerin Perna, MD;  Location: [facility] OR;  Service: Open Heart Surgery;  Laterality: N/A;   Family History  Problem Relation Age of Onset  . Heart attack Mother   . Heart attack Father    History  Substance Use Topics  . Smoking status: Former Smoker -- 2.00 packs/day for 45 years    Types: Cigarettes    Quit date: 11/11/2001  . Smokeless tobacco: Never Used  . Alcohol Use: Yes     Comment: 05/24/2013 "use to drink beer; hardly nothing since 2003; once in awhile a beer "    Review of Systems  Constitutional: Negative for fever, chills and fatigue.  Eyes: Negative for visual disturbance.  Respiratory: Negative for cough, chest tightness, shortness of breath and wheezing.   Cardiovascular: Negative for chest pain, palpitations and leg swelling.  Gastrointestinal: Negative for nausea, vomiting, abdominal pain, diarrhea and constipation.  Genitourinary: Negative for dysuria, frequency, hematuria and flank pain.  Musculoskeletal: Negative for myalgias and back pain.  Skin: Negative for rash and wound.  Neurological: Negative for dizziness, syncope, facial asymmetry, speech difficulty, weakness, numbness and headaches.  Psychiatric/Behavioral: Positive for confusion.  All other systems reviewed and are negative.    Allergies  Review  of patient's allergies indicates no known allergies.  Home Medications   No current outpatient prescriptions on file. BP   Pulse 86  Temp(Src) 98.1 F (36.7 C) (Oral)  Resp 17  Ht 6' (1.829 m)  Wt 218 lb (98.884 kg)  BMI 29.56 kg/m2  SpO2 92% Physical Exam  Nursing note and vitals reviewed. Constitutional: He is oriented to person, place, and time. He appears well-developed and well-nourished. No distress.  HENT:  Head: Normocephalic and atraumatic.  Mouth/Throat: Oropharynx is clear and moist.  Eyes:  EOM are normal. Pupils are equal, round, and reactive to light.  Neck: Normal range of motion. Neck supple.  Cardiovascular:  Mechanical hum  Pulmonary/Chest: Effort normal and breath sounds normal. No respiratory distress. He has no wheezes. He has no rales.  Abdominal: Soft. Bowel sounds are normal. He exhibits no distension and no mass. There is no tenderness. There is no rebound and no guarding.  Musculoskeletal: Normal range of motion. He exhibits no edema and no tenderness.  Neurological: He is alert and oriented to person, place, and time.  5/5 motor in all ext, sensation intact, finger to nose intact, oriented x 4, recent and long term memory intact.   Skin: Skin is warm and dry. No rash noted. No erythema.  Psychiatric: He has a normal mood and affect. His behavior is normal.    ED Course   Procedures (including critical care time)  Labs Reviewed  CBC WITH DIFFERENTIAL - Abnormal; Notable for the following:    RBC 3.35 (*)    Hemoglobin 9.4 (*)    HCT 31.6 (*)    MCHC 29.7 (*)    RDW 19.1 (*)    All other components within normal limits  BASIC METABOLIC PANEL - Abnormal; Notable for the following:    Glucose, Bld 125 (*)    GFR calc non Af Amer 84 (*)    All other components within normal limits  LACTATE DEHYDROGENASE - Abnormal; Notable for the following:    LDH 374 (*)    All other components within normal limits  PROTIME-INR - Abnormal; Notable for the following:    Prothrombin Time 25.8 (*)    INR 2.45 (*)    All other components within normal limits  APTT - Abnormal; Notable for the following:    aPTT 46 (*)    All other components within normal limits  HEPATIC FUNCTION PANEL - Abnormal; Notable for the following:    Albumin 3.2 (*)    All other components within normal limits  PROTIME-INR - Abnormal; Notable for the following:    Prothrombin Time 27.4 (*)    INR 2.66 (*)    All other components within normal limits  LACTATE DEHYDROGENASE - Abnormal;  Notable for the following:    LDH 299 (*)    All other components within normal limits  CBC WITH DIFFERENTIAL - Abnormal; Notable for the following:    RBC 3.08 (*)    Hemoglobin 8.6 (*)    HCT 29.1 (*)    MCHC 29.6 (*)    RDW 19.5 (*)    Platelets 134 (*)    All other components within normal limits  URINALYSIS, ROUTINE W REFLEX MICROSCOPIC - Abnormal; Notable for the following:    Color, Urine AMBER (*)    All other components within normal limits  HAPTOGLOBIN - Abnormal; Notable for the following:    Haptoglobin <25 (*)    All other components within normal limits  COMPREHENSIVE METABOLIC PANEL -  Abnormal; Notable for the following:    Potassium 3.4 (*)    Albumin 2.9 (*)    GFR calc non Af Amer 89 (*)    All other components within normal limits  PROTIME-INR - Abnormal; Notable for the following:    Prothrombin Time 26.6 (*)    INR 2.55 (*)    All other components within normal limits  OCCULT BLOOD X 1 CARD TO LAB, STOOL - Abnormal; Notable for the following:    Fecal Occult Bld POSITIVE (*)    All other components within normal limits  IRON AND TIBC - Abnormal; Notable for the following:    Saturation Ratios 14 (*)    All other components within normal limits  RETICULOCYTES - Abnormal; Notable for the following:    Retic Ct Pct 7.4 (*)    RBC. 3.24 (*)    Retic Count, Manual 239.8 (*)    All other components within normal limits  CBC WITH DIFFERENTIAL - Abnormal; Notable for the following:    RBC 3.27 (*)    Hemoglobin 9.1 (*)    HCT 31.1 (*)    MCHC 29.3 (*)    RDW 19.4 (*)    Platelets 137 (*)    All other components within normal limits  PROTIME-INR - Abnormal; Notable for the following:    Prothrombin Time 27.8 (*)    INR 2.71 (*)    All other components within normal limits  TROPONIN I  VITAMIN B12  FOLATE  FERRITIN  LACTATE DEHYDROGENASE  COMPREHENSIVE METABOLIC PANEL  HEMOGLOBIN FREE, PLASMA  TYPE AND SCREEN   Ct Head Wo Contrast  05/24/2013    *RADIOLOGY REPORT*  Clinical Data: Confusion  CT HEAD WITHOUT CONTRAST  Technique:  Contiguous axial images were obtained from the base of the skull through the vertex without contrast.  Comparison: None.  Findings: No acute hemorrhage, acute infarction, or mass lesion is identified.  No midline shift.  No ventriculomegaly.  No skull fracture.  Chronic right maxillary sinusitis noted.  IMPRESSION: No acute intracranial finding.  Chronic right maxillary sinusitis.   Original Report Authenticated By: Christiana Pellant, M.D.    Date: 05/24/2013  Rate: 84  Rhythm: atrial fibrillation  QRS Axis: normal  Intervals: normal  ST/T Wave abnormalities: nonspecific T wave changes  Conduction Disutrbances:none  Narrative Interpretation:   Old EKG Reviewed: unchanged   MDM  Congestive heart failure team at bedside. Neurology has been consult. Congestive heart failure team to admit patient. No further episodes of confusion is noted.  Loren Racer, MD 05/26/13 [phone]

## 2013-05-24 NOTE — Consult Note (Signed)
Reason for Consult: Transient alteration in mental status.  HPI:                                                                                                                                          [name] is an 72 y.o. male a history of ischemic cardiomyopathy, congestive heart failure, sleep apnea, hyperlipidemia, atrial fibrillation, ICD implantation and COPD, brought to the emergency room following onset of confusion. Patient was aware of the that he was confused and had difficulty remembering events from earlier in the day as well as difficulty concentrating in general. There was no headache nor any feeling of lightheadedness or dizziness. Onset was around 2 PM this afternoon. He had no focal weakness nor sensory changes. Speech was not slurred. End of difficulty stating what he wanted to say nor understanding what others were saying to him. Said no previous history of similar symptoms. Confusion resolved after arriving in the emergency room. Patient has an LVAD which is being evaluated by the advanced CHF management team. He has no complaints at this point. CT scan of his head showed no acute intracranial abnormality. He's on Coumadin for anticoagulation. INR was 2.45. Blood sugar was 125. Electrolytes were normal.  Past Medical History  Diagnosis Date  . Ischemic cardiomyopathy      CABG 2003, PCI 2007  EF 27%(myoview 2012  . CHF (congestive heart failure)     class I-II  . Sleep apnea     ?  [name] Intestine disorder     blockage  . ICD (implantable cardiac defibrillator) in place   . Hyperlipidemia   . Hypothyroidism   . Chronic anticoagulation   . Obesity   . COPD (chronic obstructive pulmonary disease)   . Asbestosis(501)   . Atrial fibrillation     paroxysmal; on coumadin  . Paroxysmal ventricular tachycardia     has ICD in place  . Coronary artery disease     Past Surgical History  Procedure Laterality Date  . Coronary artery bypass graft    . Cardiac  defibrillator placement    . Insertion of implantable left ventricular assist device N/A 01/12/2013    Procedure: INSERTION OF IMPLANTABLE LEFT VENTRICULAR ASSIST DEVICE;  Surgeon: Kerin Perna, MD;  Location: [facility] OR;  Service: Open Heart Surgery;  Laterality: N/A;   nitric oxide; Redo sternotomy  . Intraoperative transesophageal echocardiogram N/A 01/12/2013    Procedure: INTRAOPERATIVE TRANSESOPHAGEAL ECHOCARDIOGRAM;  Surgeon: Kerin Perna, MD;  Location: [facility] OR;  Service: Open Heart Surgery;  Laterality: N/A;    Family History  Problem Relation Age of Onset  . Heart attack Mother   . Heart attack Father     Social History:  reports that he quit smoking about 11 years ago. His smoking use included Cigarettes. He has a 90 pack-year smoking history. He does not have any smokeless tobacco history on  file. He reports that he does not drink alcohol or use illicit drugs.  No Known Allergies  MEDICATIONS:                                                                                                                     I have reviewed the patient's current medications.   ROS:                                                                                                                                       History obtained from spouse and the patient  General ROS: negative for - chills, fatigue, fever, night sweats, weight gain or weight loss Psychological ROS: negative for - behavioral disorder, hallucinations, memory difficulties, mood swings or suicidal ideation Ophthalmic ROS: negative for - blurry vision, double vision, eye pain or loss of vision ENT ROS: negative for - epistaxis, nasal discharge, oral lesions, sore throat, tinnitus or vertigo Allergy and Immunology ROS: negative for - hives or itchy/watery eyes Hematological and Lymphatic ROS: negative for - bleeding problems, bruising or swollen lymph nodes Endocrine ROS: negative for - galactorrhea, hair pattern changes,  polydipsia/polyuria or temperature intolerance Respiratory ROS: negative for - cough, hemoptysis, shortness of breath or wheezing Cardiovascular ROS: negative for - chest pain, dyspnea on exertion, edema or irregular heartbeat Gastrointestinal ROS: negative for - abdominal pain, diarrhea, hematemesis, nausea/vomiting or stool incontinence Genito-Urinary ROS: negative for - dysuria, hematuria, incontinence or urinary frequency/urgency Musculoskeletal ROS: negative for - joint swelling or muscular weakness Neurological ROS: as noted in HPI Dermatological ROS: negative for rash and skin lesion changes   Blood pressure , temperature 97.7 F (36.5 C), temperature source Oral, resp. rate 16, weight 104.327 kg (230 lb), SpO2 97.00%.   Neurologic Examination:                                                                                                      Mental Status: Alert, oriented, thought content appropriate.  Speech fluent without  evidence of aphasia. Able to follow commands without difficulty. Cranial Nerves: II-Visual fields were normal. III/IV/VI-Pupils were equal and reacted. Extraocular movements were full and conjugate.    V/VII-no facial numbness and no facial weakness. VIII-normal. X-normal speech and symmetrical palatal movement. Motor: 5/5 bilaterally with normal tone and bulk Sensory: Normal throughout except for reduced vibratory sensation distally in both lower extremities. Deep Tendon Reflexes: Trace to 1+ and symmetric. Plantars: Flexor bilaterally Cerebellar: Normal finger-to-nose testing. Carotid auscultation: Normal     Ct Head Wo Contrast  05/24/2013   *RADIOLOGY REPORT*  Clinical Data: Confusion  CT HEAD WITHOUT CONTRAST  Technique:  Contiguous axial images were obtained from the base of the skull through the vertex without contrast.  Comparison: None.  Findings: No acute hemorrhage, acute infarction, or mass lesion is identified.  No midline shift.  No  ventriculomegaly.  No skull fracture.  Chronic right maxillary sinusitis noted.  IMPRESSION: No acute intracranial finding.  Chronic right maxillary sinusitis.   Original Report Authenticated By: Christiana Pellant, M.D.    Assessment/Plan:  72 year old man with transient confusion of unclear etiology. Description of mental status changes is not consistent with transient global amnesia. There is no clear indication of TIA, as there is no clear description of receptive or expressive aphasia nor speech changes or focal motor or sensory abnormalities. Focal seizure is not likely, particularly with patient having no automatisms, and clear memory for events that occurred during the time of his mental status changes.  Patient is unable to undergo MRI study because of his LVAD. I will order a routine EEG as well as carotid Doppler study.  No changes in medications recommended. I will see him in followup in the a.m.  C.R. Roseanne Reno, MD [facility] [phone]  05/24/2013, 6:25 PM

## 2013-05-25 ENCOUNTER — Observation Stay (HOSPITAL_COMMUNITY): Payer: Medicare Other

## 2013-05-25 DIAGNOSIS — I517 Cardiomegaly: Secondary | ICD-10-CM

## 2013-05-25 DIAGNOSIS — R4182 Altered mental status, unspecified: Secondary | ICD-10-CM

## 2013-05-25 LAB — URINALYSIS, ROUTINE W REFLEX MICROSCOPIC
Bilirubin Urine: NEGATIVE
Nitrite: NEGATIVE
Protein, ur: NEGATIVE mg/dL
Specific Gravity, Urine: 1.026 (ref 1.005–1.030)
Urobilinogen, UA: 1 mg/dL (ref 0.0–1.0)

## 2013-05-25 LAB — COMPREHENSIVE METABOLIC PANEL
ALT: 7 U/L (ref 0–53)
CO2: 25 mEq/L (ref 19–32)
Calcium: 9.5 mg/dL (ref 8.4–10.5)
Creatinine, Ser: 0.77 mg/dL (ref 0.50–1.35)
GFR calc Af Amer: >90 mL/min (ref >90)
GFR calc non Af Amer: 89 mL/min — ABNORMAL LOW (ref >90)
Glucose, Bld: 95 mg/dL (ref 70–99)
Sodium: 137 mEq/L (ref 135–145)
Total Protein: 6.1 g/dL (ref 6.0–8.3)

## 2013-05-25 LAB — CBC WITH DIFFERENTIAL/PLATELET
Basophils Absolute: 0 10*3/uL (ref 0.0–0.1)
Basophils Relative: 0 % (ref 0–1)
Eosinophils Absolute: 0.2 10*3/uL (ref 0.0–0.7)
MCH: 27.9 pg (ref 26.0–34.0)
MCHC: 29.6 g/dL — ABNORMAL LOW (ref 30.0–36.0)
Neutro Abs: 4.6 10*3/uL (ref 1.7–7.7)
Neutrophils Relative %: 73 % (ref 43–77)
RDW: 19.5 % — ABNORMAL HIGH (ref 11.5–15.5)

## 2013-05-25 LAB — LACTATE DEHYDROGENASE: LDH: 299 U/L — ABNORMAL HIGH (ref 94–250)

## 2013-05-25 LAB — FOLATE: Folate: >20 ng/mL

## 2013-05-25 LAB — PROTIME-INR
INR: 2.55 — ABNORMAL HIGH (ref 0.00–1.49)
Prothrombin Time: 26.6 seconds — ABNORMAL HIGH (ref 11.6–15.2)

## 2013-05-25 LAB — RETICULOCYTES
RBC.: 3.24 MIL/uL — ABNORMAL LOW (ref 4.22–5.81)
Retic Count, Absolute: 239.8 10*3/uL — ABNORMAL HIGH (ref 19.0–186.0)
Retic Ct Pct: 7.4 % — ABNORMAL HIGH (ref 0.4–3.1)

## 2013-05-25 LAB — HAPTOGLOBIN: Haptoglobin: <25 mg/dL — ABNORMAL LOW (ref 45–215)

## 2013-05-25 LAB — VITAMIN B12: Vitamin B-12: 699 pg/mL (ref 211–911)

## 2013-05-25 LAB — IRON AND TIBC: UIBC: 325 ug/dL (ref 125–400)

## 2013-05-25 MED ORDER — WARFARIN SODIUM 5 MG PO TABS
5.0000 mg | ORAL_TABLET | Freq: Once | ORAL | Status: AC
  Administered 2013-05-25: 5 mg via ORAL
  Filled 2013-05-25: qty 1

## 2013-05-25 MED ORDER — DIAZEPAM 5 MG PO TABS
ORAL_TABLET | ORAL | Status: AC
  Filled 2013-05-25: qty 1

## 2013-05-25 MED ORDER — ENSURE COMPLETE PO LIQD: 237.0000 mL | ORAL | Status: DC

## 2013-05-25 MED ORDER — DIAZEPAM 2 MG PO TABS
2.0000 mg | ORAL_TABLET | Freq: Two times a day (BID) | ORAL | Status: DC | PRN
  Administered 2013-05-25 (×2): 2 mg via ORAL
  Filled 2013-05-25: qty 1

## 2013-05-25 MED ORDER — POTASSIUM CHLORIDE CRYS ER 20 MEQ PO TBCR
20.0000 meq | EXTENDED_RELEASE_TABLET | Freq: Once | ORAL | Status: AC
  Administered 2013-05-25: 20 meq via ORAL

## 2013-05-25 NOTE — Progress Notes (Signed)
INITIAL NUTRITION ASSESSMENT  DOCUMENTATION CODES Per approved criteria  -Not Applicable   INTERVENTION: 1.  Ensure Complete po daily, each supplement provides 350 kcal and 13 grams of protein.   NUTRITION DIAGNOSIS: Inadequate oral intakee related to increased activity level as evidenced by weight loss.   Goal: PO intake to meet >/=90% estimated nutrition needs.   Monitor:  PO intake, weight trends, labs  Reason for Assessment: Malnutrition Screening tool  72 y.o. male  Admitting Dx: TIA (transient ischemic attack)  ASSESSMENT: Pt s/p VAD on 01/12/13, was doing well until 8/25 when he had about 1 hour of confusion that spontaneously resolved. Brought to ED for possible TIA work up. Head CT negative. A+Ox4 at this time. Found with Hgb trending down. Being prepped for an EEG at time of RD visit.   Pt lost weight during admission for VAD placement per wife. Was stable at home for a while, but in the past month has lost about 12 lbs (5% body weight). No changes in intake, possible increase in activity level as he and his wife have been camping.    Height: Ht Readings from Last 1 Encounters:  05/25/13 6' (1.829 m)    Weight: Wt Readings from Last 1 Encounters:  05/25/13 217 lb 9.6 oz (98.703 kg)    Ideal Body Weight: 178 lbs   % Ideal Body Weight: 122%  Wt Readings from Last 10 Encounters:  05/25/13 217 lb 9.6 oz (98.703 kg)  04/21/13 229 lb (103.874 kg)  03/19/13 226 lb 12.8 oz (102.876 kg)  03/03/13 227 lb (102.967 kg)  02/17/13 225 lb 8 oz (102.286 kg)  02/10/13 225 lb 1.9 oz (102.114 kg)  02/03/13 226 lb 4 oz (102.626 kg)  01/27/13 231 lb 4 oz (104.894 kg)  01/22/13 219 lb 2.2 oz (99.4 kg)  01/22/13 219 lb 2.2 oz (99.4 kg)    Usual Body Weight: 225 lbs recently  % Usual Body Weight: 96%  BMI:  Body mass index is 29.51 kg/(m^2). overweight   Estimated Nutritional Needs: Kcal: 2000-2200 Protein: 100-110 gm Fluid: 2-2.2 L   Skin: intact   Diet Order:  Cardiac  EDUCATION NEEDS: -No education needs identified at this time   Intake/Output Summary (Last 24 hours) at 05/25/13 1329 Last data filed at 05/25/13 0900  Gross per 24 hour  Intake    480 ml  Output    250 ml  Net    230 ml    Last BM: PTA    Labs:   Recent Labs Lab 05/24/13 1550 05/25/13 0415  NA 138 137  K 3.7 3.4*  CL 104 104  CO2 25 25  BUN 17 16  CREATININE 0.89 0.77  CALCIUM 9.7 9.5  GLUCOSE 125* 95    CBG (last 3)  No results found for this basename: GLUCAP,  in the last 72 hours  Scheduled Meds: . aspirin EC  325 mg Oral QHS  . atorvastatin  40 mg Oral q1800  . budesonide-formoterol  2 puff Inhalation BID  . cholecalciferol  2,000 Units Oral QHS  . citalopram  40 mg Oral QHS  . docusate sodium  200 mg Oral QHS  . ferrous fumarate-b12-vitamic C-folic acid  1 capsule Oral BID  . [START ON 05/26/2013] furosemide  40 mg Oral Q M,W,F  . levothyroxine  50 mcg Oral QAC breakfast  . metoprolol tartrate  6.25 mg Oral Daily  . pantoprazole  40 mg Oral QHS  . potassium chloride SA  20 mEq Oral  Q M,W,F  . sodium chloride  3 mL Intravenous Q12H  . tiotropium  18 mcg Inhalation Daily  . cyanocobalamin  1,000 mcg Oral QHS  . ascorbic acid  500 mg Oral BID  . warfarin  5 mg Oral ONCE-1800  . Warfarin - Pharmacist Dosing Inpatient   Does not apply q1800    Continuous Infusions:   Past Medical History  Diagnosis Date  . Ischemic cardiomyopathy      CABG 2003, PCI 2007  EF 27%(myoview 2012  . CHF (congestive heart failure)     class I-II  . Intestine disorder     blockage  . Hyperlipidemia   . Hypothyroidism   . Chronic anticoagulation   . Obesity   . COPD (chronic obstructive pulmonary disease)   . 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 Atrial fibrillation     paroxysmal; on coumadin  . Paroxysmal ventricular tachycardia     has ICD in place  . Coronary artery disease   . Automatic implantable cardioverter-defibrillator in  situ   . Hypertension   . Myocardial infarction 1990's-2000    "2 in  ~ the 1990's; 1 in ~ 2000" (05/24/2013)  . Exertional shortness of breath   . OSA on CPAP   . On home oxygen therapy     "2L q hs" (05/24/2013)  . Depression     Past Surgical History  Procedure Laterality Date  . Coronary artery bypass graft  2003    "?X4" (05/24/2013)  . Cardiac defibrillator placement  2004; ~ 2010  . Insertion of implantable left ventricular assist device N/A 01/12/2013    Procedure: INSERTION OF IMPLANTABLE LEFT VENTRICULAR ASSIST DEVICE;  Surgeon: Kerin Perna, MD;  Location: [facility] OR;  Service: Open Heart Surgery;  Laterality: N/A;   nitric oxide; Redo sternotomy  . Intraoperative transesophageal echocardiogram N/A 01/12/2013    Procedure: INTRAOPERATIVE TRANSESOPHAGEAL ECHOCARDIOGRAM;  Surgeon: Kerin Perna, MD;  Location: [facility] OR;  Service: Open Heart Surgery;  Laterality: N/A;    Clarene Duke RD, LDN Pager [phone] After Hours pager [phone]

## 2013-05-25 NOTE — Progress Notes (Signed)
EEG Completed; Results Pending  

## 2013-05-25 NOTE — Progress Notes (Addendum)
*  PRELIMINARY RESULTS* Vascular Ultrasound Carotid Duplex (Doppler) has been completed.  Preliminary findings:   Carotid waveforms are continuous due to LVAD.  No apparent significant  ICA narrowing bilaterally. There is mild plaque at bilateral carotid bulbs. Cannot assess range of stenosis.   Farrel Demark, RDMS, RVT  05/25/2013, 11:10 AM

## 2013-05-25 NOTE — Progress Notes (Signed)
ANTICOAGULATION CONSULT NOTE - Initial Consult  Pharmacy Consult for Coumadin Indication: LVAD  No Known Allergies  Patient Measurements: Height: 6' (182.9 cm) Weight: 217 lb 9.6 oz (98.703 kg) IBW/kg (Calculated) : 77.6  Vital Signs: Temp: 98.5 F (36.9 C) (08/26 0300) Temp src: Oral (08/26 0300) Pulse Rate: 68 (08/26 0300)  Labs:  Recent Labs  05/24/13 1550 05/24/13 1552 05/24/13 1830 05/25/13 0415  HGB 9.4*  --   --  8.6*  HCT 31.6*  --   --  29.1*  PLT 155  --   --  134*  APTT  --  46*  --   --   LABPROT 25.8*  --  27.4* 26.6*  INR 2.45*  --  2.66* 2.55*  CREATININE 0.89  --   --  0.77  TROPONINI  --  <0.30  --   --     Estimated Creatinine Clearance: 103 ml/min (by C-G formula based on Cr of 0.77).   Medical History: Past Medical History  Diagnosis Date  . Ischemic cardiomyopathy      CABG 2003, PCI 2007  EF 27%(myoview 2012  . CHF (congestive heart failure)     class I-II  . Intestine disorder     blockage  . Hyperlipidemia   . Hypothyroidism   . Chronic anticoagulation   . Obesity   . COPD (chronic obstructive pulmonary disease)   . 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 Atrial fibrillation     paroxysmal; on coumadin  . Paroxysmal ventricular tachycardia     has ICD in place  . Coronary artery disease   . Automatic implantable cardioverter-defibrillator in situ   . Hypertension   . Myocardial infarction 1990's-2000    "2 in  ~ the 1990's; 1 in ~ 2000" (05/24/2013)  . Exertional shortness of breath   . OSA on CPAP   . On home oxygen therapy     "2L q hs" (05/24/2013)  . Depression    Assessment: 72yom s/p LVAD implantation 01/12/13 presents to the ED with a one hour period of confusion - ?TIA related to transient thrombus from the pump vs afib. He is on coumadin for his LVAD and will continue this while inpatient. INR is therapeutic at 2.55. H/H trending down 11.1<9.4<8.6. Pt denies s/s of bleeding. FOBT and anemia panel  ordered.  Home dose = 5mg  daily (last home dose on 8/24)  Goal of Therapy:  INR 2-3 Monitor platelets by anticoagulation protocol: Yes   Plan:  1) Cont home coumadin 5mg  x 1 tonight 2) Daily INR 3) F/u FOBT and anemia panel results  Margie Billet, PharmD Clinical Pharmacist - Resident Pager: [phone] Pharmacy: [phone] 05/25/2013 11:23 AM

## 2013-05-25 NOTE — Progress Notes (Signed)
Subjective: No new complaints. Patient has not experienced a recurrence of altered mental status.  Objective: Current vital signs: BP   Pulse 68  Temp(Src) 98.5 F (36.9 C) (Oral)  Resp 18  Ht 6' (1.829 m)  Wt 98.703 kg (217 lb 9.6 oz)  BMI 29.51 kg/m2  SpO2 100%  Neurologic Exam: Alert and in no acute distress. Mental status was normal, including short-term memory as well as orientation. Speech was normal with no indications of receptive or expressive aphasia. No facial weakness noted. Patient moved extremities well and equally with no signs of focal weakness.   Medications: I have reviewed the patient's current medications.  Assessment/Plan: Transient alteration in mental status with confusion and memory difficulty. Etiology is unclear. Patient has had no recurrence of mental status changes. No focal neurological deficits noted.  EEG and carotid Doppler study are pending. Further management recommendations will depend on results of above studies. No further neurological intervention is indicated studies unremarkable.   C.R. Roseanne Reno, MD [facility] [phone]  05/25/2013  8:50 AM

## 2013-05-25 NOTE — Discharge Summary (Signed)
Advanced Heart Failure Team  Discharge Summary   Patient ID: ESAIAH WANLESS MRN: [id number], DOB/AGE: May 31, 1941 72 y.o. Admit date: 05/24/2013 D/C date:     05/26/2013   Primary Discharge Diagnoses:  1) Transient confusion  - cleared this am and has not had any more s/s. - EEG normal no epileptic disorder demonstrated.  - CT head negative  2) Chronic systolic HF, s/p LVAD 4/14  - NYHA II symptoms and volume status stable.  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 Continue iron.     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   Admitted to [facility] 05/24/13 with a one hour period of confusion during which he couldn't remember where he was or figure things out. CT of head was negative. Neurology consulted and EEG obtained. EEG normal no epileptic disorder demonstrated.  Symptoms resolved spontaneously and he was back to baseline. Head CT in ER negative. Carotid u/s completed with no stenosis. Neurology did not feel this was TIA. LDH normal.  From HF standpoint he has remained stable. He will only take lasix for weight 224 pounds or greater. Occasional PI events with LVAD. LDH 289 Haptoglobin < 25. Mild anemia thought to be due to low-grade ongoing hemolysis. May need IV iron at some point. Continue po iron for now.    He will continue to be followed closely in the HF clinic with follow up scheduled 06/02/13 at 9:30   LVAD INTERROGATION:  HeartMate II LVAD: Flow 6.7 liters/min, speed 9190, power 4.9, PI 6.2.    Discharge Weight Range: 218 pounds  Discharge Vitals: Blood pressure 60/1, pulse 82, temperature 97.8 F (36.6 C), temperature source Oral, resp. rate 18, height 6' (1.829 m), weight 218 lb (98.884 kg), SpO2 92.00%.  Labs: Lab Results  Component Value Date   WBC 6.6 05/26/2013   HGB 9.1* 05/26/2013   HCT 31.1* 05/26/2013   MCV  95.1 05/26/2013   PLT 137* 05/26/2013     Recent Labs Lab 05/26/13 0610  NA 139  K 4.0  CL 107  CO2 25  BUN 14  CREATININE 0.80  CALCIUM 9.7  PROT 6.3  BILITOT 0.5  ALKPHOS 51  ALT 8  AST 20  GLUCOSE 103*   Lab Results  Component Value Date   CHOL 126 07/02/2012   HDL 40.80 07/02/2012   LDLCALC 58 07/02/2012   TRIG 134.0 07/02/2012   BNP (last 3 results)  Recent Labs  01/10/13 1048 01/13/13 0350 01/19/13 0515  PROBNP 763.5* 1080.0* 2665.0*    Diagnostic Studies/Procedures   Ct Head Wo Contrast  05/24/2013   *RADIOLOGY REPORT*  Clinical Data: Confusion  CT HEAD WITHOUT CONTRAST  Technique:  Contiguous axial images were obtained from the base of the skull through the vertex without contrast.  Comparison: None.  Findings: No acute hemorrhage, acute infarction, or mass lesion is identified.  No midline shift.  No ventriculomegaly.  No skull fracture.  Chronic right maxillary sinusitis noted.  IMPRESSION: No acute intracranial finding.  Chronic right maxillary sinusitis.   Original Report Authenticated By: Christiana Pellant, M.D.    Discharge Medications     Medication List         albuterol (2.5 MG/3ML) 0.083% nebulizer solution  Commonly known as:  PROVENTIL  Take 2.5 mg by nebulization every 6 (six) hours as needed for wheezing or shortness  of breath.     ascorbic acid 500 MG tablet  Commonly known as:  VITAMIN C  Take 500 mg by mouth 2 (two) times daily.     aspirin EC 325 MG tablet  Take 325 mg by mouth at bedtime.     budesonide-formoterol 160-4.5 MCG/ACT inhaler  Commonly known as:  SYMBICORT  Inhale 2 puffs into the lungs 2 (two) times daily.     Cholecalciferol 10000 UNITS Caps  Take 2 capsules by mouth at bedtime.     citalopram 40 MG tablet  Commonly known as:  CELEXA  Take 40 mg by mouth at bedtime.     cyanocobalamin 1000 MCG tablet  Take 1,000 mcg by mouth at bedtime.     docusate sodium 100 MG capsule  Commonly known as:  COLACE  Take 200  mg by mouth at bedtime.     ferrous fumarate-b12-vitamic C-folic acid capsule  Commonly known as:  TRINSICON / FOLTRIN  Take 1 capsule by mouth 2 (two) times daily.     furosemide 40 MG tablet  Commonly known as:  LASIX  Take 1 tablet (40 mg total) by mouth as needed (Weight 224 pounds or greater). Take with potassium     levothyroxine 100 MCG tablet  Commonly known as:  SYNTHROID, LEVOTHROID  Take 50 mcg by mouth at bedtime.     metoprolol succinate 25 MG 24 hr tablet  Commonly known as:  TOPROL-XL  Take 6.25-12.5 mg by mouth 2 (two) times daily. 1/4 tablet (6.25mg ) every morning and 1/2 tablet (12.5mg ) every evening     nitroGLYCERIN 0.4 MG SL tablet  Commonly known as:  NITROSTAT  Place 0.4 mg under the tongue every 5 (five) minutes as needed.     pantoprazole 40 MG tablet  Commonly known as:  PROTONIX  Take 40 mg by mouth at bedtime.     potassium chloride SA 20 MEQ tablet  Commonly known as:  K-DUR,KLOR-CON  Take 20 mEq by mouth every Monday, Wednesday, and Friday. With furosemide     simvastatin 80 MG tablet  Commonly known as:  ZOCOR  Take 40 mg by mouth at bedtime.     tiotropium 18 MCG inhalation capsule  Commonly known as:  SPIRIVA  Place 18 mcg into inhaler and inhale daily.     traMADol 50 MG tablet  Commonly known as:  ULTRAM  Take 50 mg by mouth every 6 (six) hours as needed for pain.     warfarin 5 MG tablet  Commonly known as:  COUMADIN  Take 5 mg by mouth every morning.        Disposition   The patient will be discharged in stable condition to home. Discharge Orders   Future Appointments Provider Department Dept Phone   06/02/2013 9:30 AM [facility] [phone]   06/28/2013 12:35 PM [facility] Main [facility] [place]) [phone]   Future Orders Complete By Expires   Contraindication to ACEI at discharge  As directed    Comments:     LVAD patient    Diet - low sodium heart healthy  As directed    Increase activity slowly  As directed      Follow-up Information   Follow up On 06/02/2013. (at 9:30 [address]20)         Duration of Discharge Encounter: Greater than 35 minutes   Signed, CLEGG,AMY  05/26/2013, 10:50 AM  Patient seen and examined  with Tonye Becket, NP. We discussed all aspects of the encounter. I agree with the assessment and plan as stated above. Ok for d/c today. See my rounding note for full details.I have edited above note to reflect my changes.   Daniel Bensimhon,MD 12:20 PM

## 2013-05-25 NOTE — Progress Notes (Signed)
Echocardiogram 2D Echocardiogram has been performed.  [name] 05/25/2013, 1:01 PM

## 2013-05-25 NOTE — Care Management Note (Unsigned)
    Page 1 of 1   05/25/2013     4:11:11 PM   CARE MANAGEMENT NOTE 05/25/2013  Patient:  [name], [name]   Account Number:  [id number]  Date Initiated:  05/25/2013  Documentation initiated by:  Brenee Gajda  Subjective/Objective Assessment:   PT ADM WITH AMS S/P LVAD IN APRIL 2014.  PTA, PT INDEPENDENT,LIVES WITH SPOUSE.     Action/Plan:   WILL FOLLOW FOR HOME NEEDS AS PT PROGRESSES.   Anticipated DC Date:  05/26/2013   Anticipated DC Plan:  HOME W HOME HEALTH SERVICES      DC Planning Services  CM consult      Choice offered to / List presented to:             Status of service:  In process, will continue to follow Medicare Important Message given?   (If response is "NO", the following Medicare IM given date fields will be blank) Date Medicare IM given:   Date Additional Medicare IM given:    Discharge Disposition:    Per UR Regulation:  Reviewed for med. necessity/level of care/duration of stay  If discussed at Long Length of Stay Meetings, dates discussed:    Comments:

## 2013-05-25 NOTE — Progress Notes (Addendum)
HeartMate 2 Rounding Note  Subjective:    [name] is a 72 year old retired [place] [employer] with a history of OSA, COPD, CAD s/p CABG (2003), chronic AF, systolic HF EF 20-25% s/p LVAD HM II implanted 01/12/13.   Has been doing very well with VAD therapy. Today developed about a one hour period of confusion during which he couldn't remember where he was or figure things out. Denies any other associated neuro deficits - no focal weakness, HA, visual changes, slurring speech, etc. Also no fever, chills, dysuria. Denies any ETOH or substance use. Symptoms resolved spontaneously and no back to baseline. Head CT in ER negative. Carotid u/s in 4/14 was normal. Denies bleeding.   Overnight stable and doing well. No longer having confusion. AxOx4. Having EEG today. Hgb trending down 11.1 < 9.4 < 8.6.   LVAD INTERROGATION:  HeartMate II LVAD:  Flow 6.5 liters/min, speed 9190, power 5.3, PI 5.2.     Objective:    Vital Signs:   Temp:  [97.4 F (36.3 C)-98.5 F (36.9 C)] 98.5 F (36.9 C) (08/26 0300) Pulse Rate:  [68-86] 68 (08/26 0300) Resp:  [16-18] 18 (08/26 0300) SpO2:  [97 %-100 %] 100 % (08/26 0300) Weight:  [217 lb 9.6 oz (98.703 kg)-230 lb (104.327 kg)] 217 lb 9.6 oz (98.703 kg) (08/26 0542) Last BM Date: 05/24/13 Mean arterial Pressure 80s  Intake/Output:   Intake/Output Summary (Last 24 hours) at 05/25/13 0831 Last data filed at 05/25/13 0030  Gross per 24 hour  Intake    240 ml  Output    250 ml  Net    -10 ml     Physical Exam: General:  Well appearing. No resp difficulty HEENT: normal Neck: supple. JVP flat. Carotids 2+ bilat; no bruits. No lymphadenopathy or thryomegaly appreciated. Cor: Mechanical heart sounds with LVAD hum present. Lungs: clear Abdomen: soft, nontender, nondistended. No hepatosplenomegaly. No bruits or masses. Good bowel sounds. Driveline: C/D/I; securement device intact and driveline incorporated Extremities: no cyanosis, clubbing, rash, edema Neuro:  alert & orientedx3, cranial nerves grossly intact. moves all 4 extremities w/o difficulty. Affect pleasant  Telemetry: Afib 90s  Labs: Basic Metabolic Panel:  Recent Labs Lab 05/24/13 1550 05/25/13 0415  NA 138 137  K 3.7 3.4*  CL 104 104  CO2 25 25  GLUCOSE 125* 95  BUN 17 16  CREATININE 0.89 0.77  CALCIUM 9.7 9.5    Liver Function Tests:  Recent Labs Lab 05/24/13 1552 05/25/13 0415  AST 23 20  ALT 8 7  ALKPHOS 58 50  BILITOT 0.5 0.6  PROT 6.7 6.1  ALBUMIN 3.2* 2.9*   No results found for this basename: LIPASE, AMYLASE,  in the last 168 hours No results found for this basename: AMMONIA,  in the last 168 hours  CBC:  Recent Labs Lab 05/24/13 1550 05/25/13 0415  WBC 8.5 6.3  NEUTROABS 6.3 4.6  HGB 9.4* 8.6*  HCT 31.6* 29.1*  MCV 94.3 94.5  PLT 155 134*    INR:  Recent Labs Lab 05/24/13 1550 05/24/13 1830 05/25/13 0415  INR 2.45* 2.66* 2.55*     KG:   Imaging: Ct Head Wo Contrast  05/24/2013   *RADIOLOGY REPORT*  Clinical Data: Confusion  CT HEAD WITHOUT CONTRAST  Technique:  Contiguous axial images were obtained from the base of the skull through the vertex without contrast.  Comparison: None.  Findings: No acute hemorrhage, acute infarction, or mass lesion is identified.  No midline shift.  No ventriculomegaly.  No  skull fracture.  Chronic right maxillary sinusitis noted.  IMPRESSION: No acute intracranial finding.  Chronic right maxillary sinusitis.   Original Report Authenticated By: Christiana Pellant, M.D.      Medications:     Scheduled Medications: . aspirin EC  325 mg Oral QHS  . atorvastatin  40 mg Oral q1800  . budesonide-formoterol  2 puff Inhalation BID  . cholecalciferol  2,000 Units Oral QHS  . citalopram  40 mg Oral QHS  . docusate sodium  200 mg Oral QHS  . ferrous fumarate-b12-vitamic C-folic acid  1 capsule Oral BID  . [START ON 05/26/2013] furosemide  40 mg Oral Q M,W,F  . levothyroxine  50 mcg Oral QAC breakfast  .  metoprolol tartrate  6.25 mg Oral Daily  . pantoprazole  40 mg Oral QHS  . potassium chloride SA  20 mEq Oral Q M,W,F  . sodium chloride  3 mL Intravenous Q12H  . tiotropium  18 mcg Inhalation Daily  . cyanocobalamin  1,000 mcg Oral QHS  . ascorbic acid  500 mg Oral BID  . Warfarin - Pharmacist Dosing Inpatient   Does not apply q1800     Infusions:     PRN Medications:  sodium chloride, albuterol, sodium chloride, traMADol   Assessment Plan/Discussion:    1) Transient confusion - cleared this am and has not had any more s/s - Neuro seen and will order EEG and carotid u/s today. They are not convinced this is TIA - CT head negative  2) Chronic systolic HF, s/p LVAD 4/14 - NYHA II symptoms and volume status stable. - Will continue BB and MAPs stable  3) Anemia - Hgb trending down, will order anemia panel today. LDH normal so doubt significant hemolysis. Haptoglobin pending - check occult blood stool.  I reviewed the LVAD parameters from today, and compared the results to the patient's prior recorded data.  No programming changes were made.  The LVAD is functioning within specified parameters.  The patient performs LVAD self-test daily.  LVAD interrogation was negative for any significant power changes, alarms or PI events/speed drops.  LVAD equipment check completed and is in good working order.  Back-up equipment present.   LVAD education done on emergency procedures and precautions and reviewed exit site care.  Length of Stay: 1  Aundria Rud 05/25/2013, 8:31 AM  VAD Team Pager [phone] (7am - 7am)  Patient seen and examined with Ulla Potash, NP. We discussed all aspects of the encounter. I agree with the assessment and plan as stated above.   Confusion has resolved. Appreciate Neuro's eval. Unclear etiology. EEG and carotid u/s pending though I suspect these will be low yield. Hemoglobin trending down. Suspect slow GIB. No evidence hemolysis currently. Check anemia  panel and FOBT. Continue protonix. Getting close to point where he may need transfusion. Will order T&S for am.  VAD interrogation looks fine.    Guillaume Weninger,MD 12:59 PM  Addendum: Haptoglobin is low so suggest at least low-grade ongoing hemolysis despite normal LDH. Retic count appropriately elevated.   Welma Mccombs,MD 1:04 PM

## 2013-05-25 NOTE — Procedures (Signed)
ELECTROENCEPHALOGRAM REPORT   Patient: [name]       Room #: 4U98 EEG No. ID: [id number] Age: 72 y.o.        Sex: male Referring Physician: Bensimhon Report Date:  05/25/2013        Interpreting Physician: Aline Brochure  History: [name] is an 72 y.o. male who experienced an episode of confusion and memory difficulty on 05/24/2013. Mental status subsequently returned to normal with no recurrence of confusion. CT scan of his head was unremarkable.  Indications for study: Rule out new onset focal seizure disorder.   Technique: This is an 18 channel routine scalp EEG performed at the bedside with bipolar and monopolar montages arranged in accordance to the international 10/20 system of electrode placement.   Description: This EEG was recorded during wakefulness as well as during drowsiness. The majority of the study was recorded during drowsiness which consisted of low amplitude 1-2 Hz diffuse irregular delta activity with superimposed 7 Hz theta activity which is most prominent posteriorly. During brief alertness background activity consisted of 9 Hz alpha rhythm. Photic stimulation produced a minimal bilateral occipital driving response. Hyperventilation was not performed. No epileptiform discharges were recorded.  Interpretation: This is a normal EEG recording during wakefulness and drowsiness. No evidence of an epileptic disorder is demonstrated.  Venetia Maxon M.D. [facility] [phone]

## 2013-05-26 DIAGNOSIS — D509 Iron deficiency anemia, unspecified: Secondary | ICD-10-CM

## 2013-05-26 LAB — CBC WITH DIFFERENTIAL/PLATELET
Basophils Absolute: 0 10*3/uL (ref 0.0–0.1)
Eosinophils Absolute: 0.2 10*3/uL (ref 0.0–0.7)
Lymphs Abs: 0.9 10*3/uL (ref 0.7–4.0)
MCH: 27.8 pg (ref 26.0–34.0)
Neutrophils Relative %: 76 % (ref 43–77)
Platelets: 137 10*3/uL — ABNORMAL LOW (ref 150–400)
RBC: 3.27 MIL/uL — ABNORMAL LOW (ref 4.22–5.81)
RDW: 19.4 % — ABNORMAL HIGH (ref 11.5–15.5)
WBC: 6.6 10*3/uL (ref 4.0–10.5)

## 2013-05-26 LAB — COMPREHENSIVE METABOLIC PANEL
ALT: 8 U/L (ref 0–53)
AST: 20 U/L (ref 0–37)
Albumin: 3 g/dL — ABNORMAL LOW (ref 3.5–5.2)
Alkaline Phosphatase: 51 U/L (ref 39–117)
Calcium: 9.7 mg/dL (ref 8.4–10.5)
GFR calc Af Amer: >90 mL/min (ref >90)
Glucose, Bld: 103 mg/dL — ABNORMAL HIGH (ref 70–99)
Potassium: 4 mEq/L (ref 3.5–5.1)
Sodium: 139 mEq/L (ref 135–145)
Total Protein: 6.3 g/dL (ref 6.0–8.3)

## 2013-05-26 LAB — OCCULT BLOOD X 1 CARD TO LAB, STOOL: Fecal Occult Bld: POSITIVE — AB

## 2013-05-26 MED ORDER — FUROSEMIDE 40 MG PO TABS: 40.0000 mg | ORAL_TABLET | ORAL | Status: DC | PRN

## 2013-05-26 NOTE — Progress Notes (Signed)
LVAD team notified of  5beat run of Vtach; no new orders. Patient sleeping. Will continue to monitor.[name]

## 2013-05-26 NOTE — Progress Notes (Signed)
Discharge instructions along with med list provided. Patient changed to portable batteries. Back up bag with extra controller,batteries, and clips with patient. IV d/c'd with catheter intact. Patient transported via wheelchair to lobby for d/c home. Wife with patient. [name]

## 2013-05-26 NOTE — Progress Notes (Signed)
HeartMate 2 Rounding Note  Subjective:    [name] is a 72 year old retired [place] V[employer] with a history of OSA, COPD, CAD s/p CABG (2003), chronic AF, systolic HF EF 20-25% s/p LVAD HM II implanted 01/12/13.   Has been doing very well with VAD therapy. Today developed about a one hour period of confusion during which he couldn't remember where he was or figure things out. Denies any other associated neuro deficits - no focal weakness, HA, visual changes, slurring speech, etc. Also no fever, chills, dysuria. Denies any ETOH or substance use. Symptoms resolved spontaneously and no back to baseline. Head CT in ER negative. Carotid u/s in 4/14 was normal. Denies bleeding.   Overnight stable and doing well. No longer having confusion. AxOx4.  Hemoglobin 8.6>9.1  Wants to go home. Denies SOB/PND/orthopnea  LVAD INTERROGATION:  HeartMate II LVAD:  Flow 6.7 liters/min, speed 9190, power 4.9, PI 6.2.     Objective:    Vital Signs:   Temp:  [97.8 F (36.6 C)-98.8 F (37.1 C)] 97.8 F (36.6 C) (08/27 0800) Pulse Rate:  [81-86] 86 (08/27 0500) Resp:  [17-18] 18 (08/27 0800) SpO2:  [92 %-94 %] 92 % (08/26 2327) Weight:  [218 lb (98.884 kg)] 218 lb (98.884 kg) (08/27 0500) Last BM Date: 05/26/13 Mean arterial Pressure 60s  Intake/Output:   Intake/Output Summary (Last 24 hours) at 05/26/13 0857 Last data filed at 05/25/13 1700  Gross per 24 hour  Intake    840 ml  Output    400 ml  Net    440 ml     Physical Exam: General:  Well appearing. No resp difficulty HEENT: normal Neck: supple. JVP flat. Carotids 2+ bilat; no bruits. No lymphadenopathy or thryomegaly appreciated. Cor: Mechanical heart sounds with LVAD hum present. Lungs: clear Abdomen: soft, nontender, nondistended. No hepatosplenomegaly. No bruits or masses. Good bowel sounds. Driveline: C/D/I; securement device intact and driveline incorporated Extremities: no cyanosis, clubbing, rash, edema Neuro: alert & orientedx3, cranial  nerves grossly intact. moves all 4 extremities w/o difficulty. Affect pleasant  Telemetry: Afib 90s  Labs: Basic Metabolic Panel:  Recent Labs Lab 05/24/13 1550 05/25/13 0415 05/26/13 0610  NA 138 137 139  K 3.7 3.4* 4.0  CL 104 104 107  CO2 25 25 25   GLUCOSE 125* 95 103*  BUN 17 16 14   CREATININE 0.89 0.77 0.80  CALCIUM 9.7 9.5 9.7    Liver Function Tests:  Recent Labs Lab 05/24/13 1552 05/25/13 0415 05/26/13 0610  AST 23 20 20   ALT 8 7 8   ALKPHOS 58 50 51  BILITOT 0.5 0.6 0.5  PROT 6.7 6.1 6.3  ALBUMIN 3.2* 2.9* 3.0*   No results found for this basename: LIPASE, AMYLASE,  in the last 168 hours No results found for this basename: AMMONIA,  in the last 168 hours  CBC:  Recent Labs Lab 05/24/13 1550 05/25/13 0415 05/26/13 0610  WBC 8.5 6.3 6.6  NEUTROABS 6.3 4.6 5.0  HGB 9.4* 8.6* 9.1*  HCT 31.6* 29.1* 31.1*  MCV 94.3 94.5 95.1  PLT 155 134* 137*    INR:  Recent Labs Lab 05/24/13 1550 05/24/13 1830 05/25/13 0415 05/26/13 0610  INR 2.45* 2.66* 2.55* 2.71*     KG:   Imaging: Ct Head Wo Contrast  05/24/2013   *RADIOLOGY REPORT*  Clinical Data: Confusion  CT HEAD WITHOUT CONTRAST  Technique:  Contiguous axial images were obtained from the base of the skull through the vertex without contrast.  Comparison: None.  Findings: No acute hemorrhage, acute infarction, or mass lesion is identified.  No midline shift.  No ventriculomegaly.  No skull fracture.  Chronic right maxillary sinusitis noted.  IMPRESSION: No acute intracranial finding.  Chronic right maxillary sinusitis.   Original Report Authenticated By: Christiana Pellant, M.D.     Medications:     Scheduled Medications: . aspirin EC  325 mg Oral QHS  . atorvastatin  40 mg Oral q1800  . budesonide-formoterol  2 puff Inhalation BID  . cholecalciferol  2,000 Units Oral QHS  . citalopram  40 mg Oral QHS  . docusate sodium  200 mg Oral QHS  . feeding supplement  237 mL Oral Q24H  . ferrous  fumarate-b12-vitamic C-folic acid  1 capsule Oral BID  . furosemide  40 mg Oral Q M,W,F  . levothyroxine  50 mcg Oral QAC breakfast  . metoprolol tartrate  6.25 mg Oral Daily  . pantoprazole  40 mg Oral QHS  . potassium chloride SA  20 mEq Oral Q M,W,F  . sodium chloride  3 mL Intravenous Q12H  . tiotropium  18 mcg Inhalation Daily  . cyanocobalamin  1,000 mcg Oral QHS  . ascorbic acid  500 mg Oral BID  . Warfarin - Pharmacist Dosing Inpatient   Does not apply q1800    Infusions:    PRN Medications: sodium chloride, albuterol, diazepam, sodium chloride, traMADol   Assessment Plan/Discussion:    1) Transient confusion - cleared this am and has not had any more s/s - Neuro evaluated. EEG no epileptic disorder demonstrated. They are not convinced this is TIA -Carotid Dopplers negative 05/25/13  - CT head negative  2) Chronic systolic HF, s/p LVAD 4/14 - NYHA II symptoms and volume status stable. - Will continue BB.   MAPs stable  3) Anemia - Hgb ok. Continue iron. T Sat 14  LDH 289. Haptoglobin <25   I reviewed the LVAD parameters from today, and compared the results to the patient's prior recorded data.  No programming changes were made.  The LVAD is functioning within specified parameters.  The patient performs LVAD self-test daily.  LVAD interrogation was negative for any significant power changes, alarms or PI events/speed drops.  LVAD equipment check completed and is in good working order.  Back-up equipment present.   LVAD education done on emergency procedures and precautions and reviewed exit site care.  Length of Stay: 2  CLEGG,AMY 05/26/2013, 8:57 AM  VAD Team Pager [phone] (7am - 7am)   Patient seen and examined with Tonye Becket, NP. We discussed all aspects of the encounter. I agree with the assessment and plan as stated above. Doing very well. No further episodes of confusion. A few brief PI events yesterday but none overnight. Hgb stable. Mild iron deficiency.  LDH remains low. Suspect mild ongoing hemolysis. Continue iron supplementation. Will continue to follow. May need IV iron at some point. OK for d/c home today. F/u next week.   Daniel Bensimhon,MD 12:17 PM

## 2013-05-27 ENCOUNTER — Other Ambulatory Visit (HOSPITAL_COMMUNITY): Payer: Self-pay | Admitting: *Deleted

## 2013-05-27 DIAGNOSIS — Z95811 Presence of heart assist device: Secondary | ICD-10-CM

## 2013-05-27 DIAGNOSIS — Z7901 Long term (current) use of anticoagulants: Secondary | ICD-10-CM

## 2013-05-28 LAB — HEMOGLOBIN FREE, PLASMA: Hgb, Plasma: 4.8 mg/dL (ref 0.0–6.9)

## 2013-06-02 ENCOUNTER — Ambulatory Visit (HOSPITAL_COMMUNITY)
Admission: RE | Admit: 2013-06-02 | Discharge: 2013-06-02 | Disposition: A | Discharge status: Home / Self Care | Payer: Medicare Other | Source: Ambulatory Visit | Attending: Internal Medicine | Admitting: Internal Medicine

## 2013-06-02 ENCOUNTER — Ambulatory Visit: Payer: Self-pay | Admitting: *Deleted

## 2013-06-02 VITALS — BP 90/0 | HR 74 | Ht 72.0 in | Wt 226.5 lb

## 2013-06-02 DIAGNOSIS — F329 Major depressive disorder, single episode, unspecified: Secondary | ICD-10-CM | POA: Insufficient documentation

## 2013-06-02 DIAGNOSIS — E669 Obesity, unspecified: Secondary | ICD-10-CM | POA: Insufficient documentation

## 2013-06-02 DIAGNOSIS — I509 Heart failure, unspecified: Secondary | ICD-10-CM | POA: Insufficient documentation

## 2013-06-02 DIAGNOSIS — I2510 Atherosclerotic heart disease of native coronary artery without angina pectoris: Secondary | ICD-10-CM | POA: Insufficient documentation

## 2013-06-02 DIAGNOSIS — J4489 Other specified chronic obstructive pulmonary disease: Secondary | ICD-10-CM | POA: Insufficient documentation

## 2013-06-02 DIAGNOSIS — Z95811 Presence of heart assist device: Secondary | ICD-10-CM

## 2013-06-02 DIAGNOSIS — J61 Pneumoconiosis due to asbestos and other mineral fibers: Secondary | ICD-10-CM | POA: Insufficient documentation

## 2013-06-02 DIAGNOSIS — I4891 Unspecified atrial fibrillation: Secondary | ICD-10-CM | POA: Insufficient documentation

## 2013-06-02 DIAGNOSIS — Z7901 Long term (current) use of anticoagulants: Secondary | ICD-10-CM | POA: Insufficient documentation

## 2013-06-02 DIAGNOSIS — I5022 Chronic systolic (congestive) heart failure: Secondary | ICD-10-CM | POA: Insufficient documentation

## 2013-06-02 DIAGNOSIS — G4733 Obstructive sleep apnea (adult) (pediatric): Secondary | ICD-10-CM | POA: Insufficient documentation

## 2013-06-02 DIAGNOSIS — Z79899 Other long term (current) drug therapy: Secondary | ICD-10-CM | POA: Insufficient documentation

## 2013-06-02 DIAGNOSIS — I2589 Other forms of chronic ischemic heart disease: Secondary | ICD-10-CM | POA: Insufficient documentation

## 2013-06-02 DIAGNOSIS — F3289 Other specified depressive episodes: Secondary | ICD-10-CM | POA: Insufficient documentation

## 2013-06-02 DIAGNOSIS — J449 Chronic obstructive pulmonary disease, unspecified: Secondary | ICD-10-CM | POA: Insufficient documentation

## 2013-06-02 DIAGNOSIS — Z95818 Presence of other cardiac implants and grafts: Secondary | ICD-10-CM | POA: Insufficient documentation

## 2013-06-02 DIAGNOSIS — I10 Essential (primary) hypertension: Secondary | ICD-10-CM | POA: Insufficient documentation

## 2013-06-02 DIAGNOSIS — Z95810 Presence of automatic (implantable) cardiac defibrillator: Secondary | ICD-10-CM | POA: Insufficient documentation

## 2013-06-02 DIAGNOSIS — E785 Hyperlipidemia, unspecified: Secondary | ICD-10-CM | POA: Insufficient documentation

## 2013-06-02 DIAGNOSIS — I252 Old myocardial infarction: Secondary | ICD-10-CM | POA: Insufficient documentation

## 2013-06-02 DIAGNOSIS — E039 Hypothyroidism, unspecified: Secondary | ICD-10-CM | POA: Insufficient documentation

## 2013-06-02 LAB — BASIC METABOLIC PANEL
BUN: 20 mg/dL (ref 6–23)
CO2: 25 mEq/L (ref 19–32)
Chloride: 108 mEq/L (ref 96–112)
Creatinine, Ser: 0.7 mg/dL (ref 0.50–1.35)
Glucose, Bld: 135 mg/dL — ABNORMAL HIGH (ref 70–99)
Potassium: 4.1 mEq/L (ref 3.5–5.1)

## 2013-06-02 LAB — CBC
HCT: 32 % — ABNORMAL LOW (ref 39.0–52.0)
Hemoglobin: 9.3 g/dL — ABNORMAL LOW (ref 13.0–17.0)
MCV: 100 fL (ref 78.0–100.0)
RBC: 3.2 MIL/uL — ABNORMAL LOW (ref 4.22–5.81)
WBC: 8.2 10*3/uL (ref 4.0–10.5)

## 2013-06-02 LAB — PROTIME-INR: INR: 2.62 — ABNORMAL HIGH (ref 0.00–1.49)

## 2013-06-02 LAB — HAPTOGLOBIN: Haptoglobin: <25 mg/dL — ABNORMAL LOW (ref 45–215)

## 2013-06-02 NOTE — Patient Instructions (Addendum)
Continue current medications  Next INR check will be 06/16/13  Your physician recommends that you schedule a follow-up appointment in: 3 weeks

## 2013-06-02 NOTE — Progress Notes (Signed)
6 min walk test preformed, pt ambulated 800 ft in 5 min 35 sec, he stopped early due to leg weakness

## 2013-06-02 NOTE — Patient Instructions (Addendum)
Continue 5 mg daily; will need INR re-checked in two weeks

## 2013-06-03 ENCOUNTER — Other Ambulatory Visit: Payer: Self-pay | Admitting: *Deleted

## 2013-06-03 DIAGNOSIS — Z95811 Presence of heart assist device: Secondary | ICD-10-CM

## 2013-06-03 DIAGNOSIS — Z7901 Long term (current) use of anticoagulants: Secondary | ICD-10-CM

## 2013-06-04 ENCOUNTER — Other Ambulatory Visit: Payer: Self-pay | Admitting: *Deleted

## 2013-06-04 DIAGNOSIS — Z95811 Presence of heart assist device: Secondary | ICD-10-CM

## 2013-06-04 DIAGNOSIS — Z7901 Long term (current) use of anticoagulants: Secondary | ICD-10-CM

## 2013-06-06 NOTE — Progress Notes (Signed)
HPI:  [name] is a 72 year old gentleman with a history of CABG (2003), Chronic systolic HF EF 20-25% %, S/P AICD (generator change 2011), hyperlipidemia, hypothyroidism, emphysema, OSA, intermittent atrial fibrillation (on coumadin). Quit smoking 2003. Uses CPAP and O2 every night. He is a disabled [employer].   He is s/p LVAD HM II implanted 01/12/13  Follow up: Clinic visit today following recent hospitalization. Patient admitted to [facility] 05/24/13 with a one hour period of confusion during which he couldn't remember where he was or figure things out. CT of head was negative. Neurology consulted and EEG obtained. EEG normal no epileptic disorder demonstrated. Symptoms resolved spontaneously and he was back to baseline. Head CT in ER negative. Carotid u/s completed with no stenosis. Neurology did not feel this was TIA. LDH normal.  From HF standpoint he has remained stable. He will only take lasix for weight 224 pounds or greater. Occasional PI events with LVAD. LDH 289 Haptoglobin < 25. Mild anemia thought to be due to low-grade ongoing hemolysis. May need IV iron at some point. Continue po iron for now.   Denies LVAD alarms. Denies driveline trauma, erythema or drainage. Pt is showering; wife is performing weekly dressing changes using sorbaview dressing. Denies ICD shocks. Reports taking Coumadin as prescribed and adherence to anticoagulation based dietary restrictions. Denies bright red blood per rectum or melena, no dark urine or hematuria   Symptom Yes No Details  Angina                 x Activity:  Claudication         x How far  Syncope         x When:   Stroke         x   Orthopnea         x How many pillows: 2 (comfort)  PND         x How often  CPAP         x  How many hrs:  All night  Pedal edema         x   Abd fullness         x   N&V         x   Good appetite; full meal  Diaphoresis         x When: night sweats (occasional)  Bleeding         x                                                           Urine - yellow  SOB         x  Activity: incline; walking "too fast"  Palpitations         x when  ICD shock         x   Hospitlizaitons         x When/where/why 8/25 - 8/27 confusion; anemia  ED visit         x When/where/why  Other MD         x When/who/why  Activity      No formal exercise program; sedentary  Fluid      No limitations  Diet      No limitations    Past Medical History  Diagnosis Date  . Ischemic cardiomyopathy      CABG 2003, PCI 2007  EF 27%(myoview 2012  . CHF (congestive heart failure)     class I-II  . Intestine disorder     blockage  . Hyperlipidemia   . Hypothyroidism   . Chronic anticoagulation   . Obesity   . COPD (chronic obstructive pulmonary disease)   . 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 Atrial fibrillation     paroxysmal; on coumadin  . Paroxysmal ventricular tachycardia     has ICD in place  . Coronary artery disease   . Automatic implantable cardioverter-defibrillator in situ   . Hypertension   . Myocardial infarction 1990's-2000    "2 in  ~ the 1990's; 1 in ~ 2000" (05/24/2013)  . Exertional shortness of breath   . OSA on CPAP   . On home oxygen therapy     "2L q hs" (05/24/2013)  . Depression     Current Outpatient Prescriptions  Medication Sig Dispense Refill  . albuterol (PROVENTIL) (2.5 MG/3ML) 0.083% nebulizer solution Take 2.5 mg by nebulization every 6 (six) hours as needed for wheezing or shortness of breath.       [name] ascorbic acid (VITAMIN C) 500 MG tablet Take 500 mg by mouth 2 (two) times daily.      [name] aspirin EC 325 MG tablet Take 325 mg by mouth at bedtime.      . budesonide-formoterol (SYMBICORT) 160-4.5 MCG/ACT inhaler Inhale 2 puffs into the lungs 2 (two) times daily.      . Cholecalciferol 10000 UNITS CAPS Take 2 capsules by mouth at bedtime.      . citalopram (CELEXA) 40 MG tablet Take 40 mg by mouth at bedtime.      . cyanocobalamin 1000 MCG tablet Take 1,000 mcg by mouth at  bedtime.       . docusate sodium (COLACE) 100 MG capsule Take 200 mg by mouth at bedtime.       . ferrous fumarate-b12-vitamic C-folic acid (TRINSICON / FOLTRIN) capsule Take 1 capsule by mouth 2 (two) times daily.      . furosemide (LASIX) 40 MG tablet Take 1 tablet (40 mg total) by mouth as needed (Weight 224 pounds or greater). Take with potassium  30 tablet  3  . levothyroxine (SYNTHROID, LEVOTHROID) 100 MCG tablet Take 50 mcg by mouth at bedtime.       . metoprolol succinate (TOPROL-XL) 25 MG 24 hr tablet Take 6.25-12.5 mg by mouth 2 (two) times daily. 1/4 tablet (6.25mg ) every morning and 1/2 tablet (12.5mg ) every evening      . pantoprazole (PROTONIX) 40 MG tablet Take 40 mg by mouth at bedtime.      . potassium chloride SA (K-DUR,KLOR-CON) 20 MEQ tablet Take 20 mEq by mouth every Monday, Wednesday, and Friday. With furosemide      . simvastatin (ZOCOR) 80 MG tablet Take 40 mg by mouth at bedtime.        [name] tiotropium (SPIRIVA) 18 MCG inhalation capsule Place 18 mcg into inhaler and inhale daily.       [name] warfarin (COUMADIN) 5 MG tablet Take 5 mg by mouth every morning.      . nitroGLYCERIN (NITROSTAT) 0.4 MG SL tablet Place 0.4 mg under the tongue every 5 (five) minutes as needed.        . traMADol (ULTRAM) 50 MG tablet Take 50 mg by mouth every 6 (six) hours as needed  for pain.       No current facility-administered medications for this encounter.    Review of patient's allergies indicates no known allergies.  REVIEW OF SYSTEMS: All systems negative except as listed in HPI, PMH and Problem list.   LVAD INTERROGATION:  Flow:  6.9 Speed:  9180 PI:  4.8 Power:  6.2 Alarms:  None Events:  Rare PI Fixed speed:  9200 Low speed limit: 8600   I reviewed the LVAD parameters from today, and compared the results to the patient's prior recorded data.  No programming changes were made.  The LVAD is functioning within specified parameters.  The patient performs LVAD self-test daily.  LVAD  interrogation was negative for any significant power changes or alarms. There were a few PI events daily, but nothing concerning and none back to back. LVAD equipment check completed and is in good working order.  Back-up equipment present.   LVAD education done on emergency procedures and precautions and reviewed exit site care.  Physical Exam: Filed Vitals:   06/02/13 0919  BP: 90/0  Pulse: 74  Height: 6' (1.829 m)  Weight: 226 lb 8 oz (102.74 kg)  SpO2: 96%  Last weight: 229 lbs    GENERAL: Well appearing, male; NAD; wife present HEENT: normal  NECK: Supple, JVP 8-9. 2+ bilaterally, no bruits.  No lymphadenopathy or thyromegaly appreciated.   CARDIAC:  Mechanical heart sounds with LVAD hum present.  LUNGS:  Clear to auscultation bilaterally.  ABDOMEN:  Soft, round, nontender, positive bowel sounds x4.     LVAD exit site: well-healed and incorporated.  Dressing dry and intact.  No erythema, drainage, odor or tenderness.  Stabilization device present and accurately applied.  Driveline dressing is being changed daily per sterile technique. Changed in clinic EXTREMITIES:  Warm and dry, no cyanosis, clubbing, rash or 2+ edema  NEUROLOGIC:  Alert and oriented x 4.  Gait steady.  No aphasia.  No dysarthria.  Affect pleasant.      ASSESSMENT AND PLAN:   1) Chronic Systolic HF, s/p LVAD implant 12/979 - Patient is currently having NYHA II/III symptoms. Suspect majority of this d/t pulmonary disease and is not cardiac related. - His fluid status is mildly elevated will increase lasix to 40 mg TID, Monday, Wed, and Fri along with taking his Potassium 20 meq on the days he takes his diuretic. - MAP elevated 106, will increase Toprol to 12.5 mg am and 25 mg pm. Instructed to call any increased dizziness. - BMET stable. - 6 min walk test today and was able to walk 800 ft in 5 min, had to stop d/t leg fatigue  2)  Anticoagulation management - INR goal 2.0-3.0. Patient has PAF.  - On Coumadin  and ASA 81 mg, will increase ASA to 325 mg. - INR 2.4 continue current dose and follow up in 2 weeks.  3) HTN - MAP increased follow plan as above increase Toprol to 12.5/25mg   4) CAD - stable denies any CP - Continue BB, ACE-I and ASA  5) LVAD- - Doing great and no alarms. Parameters within normal range. Will check LDH today. - Completed 3 mos Intermacs KCCQ-12, QOL, and neurocognitive assessment with VAD Coordinator. - Instructed to advance to weekly dressing changes using weekly dressing kit; education and demonstration provided using weekly dressing kit. Wife verbalized understanding of same. - Pt may begin showers; education and demonstration provided using pocket controller shower kit; pt and wife verbalized understanding  - Follow up 6 weeks.  6) Depression -Told him this was normal. On Celexa. Discussed need to develop a hobby and be more involved in activities.   Arvilla Meres MD 11:02 PM

## 2013-06-08 ENCOUNTER — Telehealth (HOSPITAL_COMMUNITY): Payer: Self-pay | Admitting: Cardiac Rehabilitation

## 2013-06-08 NOTE — Telephone Encounter (Signed)
Multiple phone calls to patient and patient wife to schedule cardiac rehab.  Pt and his wife state they are waiting for [facility] Fee Basis Approval prior to enrolling in program.  Pt states he is not interested in scheduling now.

## 2013-06-16 ENCOUNTER — Ambulatory Visit (INDEPENDENT_AMBULATORY_CARE_PROVIDER_SITE_OTHER): Payer: Medicare Other | Admitting: *Deleted

## 2013-06-16 ENCOUNTER — Ambulatory Visit (HOSPITAL_COMMUNITY): Payer: Self-pay | Admitting: Cardiology

## 2013-06-16 DIAGNOSIS — Z95818 Presence of other cardiac implants and grafts: Secondary | ICD-10-CM

## 2013-06-16 DIAGNOSIS — I4891 Unspecified atrial fibrillation: Secondary | ICD-10-CM

## 2013-06-16 DIAGNOSIS — Z95811 Presence of heart assist device: Secondary | ICD-10-CM

## 2013-06-16 DIAGNOSIS — Z7901 Long term (current) use of anticoagulants: Secondary | ICD-10-CM

## 2013-06-16 LAB — PROTIME-INR: INR: 3 ratio — ABNORMAL HIGH (ref 0.8–1.0)

## 2013-06-23 ENCOUNTER — Ambulatory Visit (HOSPITAL_COMMUNITY)
Admission: RE | Admit: 2013-06-23 | Discharge: 2013-06-23 | Disposition: A | Discharge status: Home / Self Care | Payer: Medicare Other | Source: Ambulatory Visit | Attending: Internal Medicine | Admitting: Internal Medicine

## 2013-06-23 ENCOUNTER — Ambulatory Visit (HOSPITAL_COMMUNITY): Payer: Self-pay | Admitting: *Deleted

## 2013-06-23 ENCOUNTER — Encounter (HOSPITAL_COMMUNITY): Payer: Self-pay

## 2013-06-23 VITALS — BP 84/0 | HR 76 | Resp 20 | Ht 72.0 in | Wt 229.1 lb

## 2013-06-23 DIAGNOSIS — Z95811 Presence of heart assist device: Secondary | ICD-10-CM

## 2013-06-23 DIAGNOSIS — F329 Major depressive disorder, single episode, unspecified: Secondary | ICD-10-CM

## 2013-06-23 DIAGNOSIS — F4321 Adjustment disorder with depressed mood: Secondary | ICD-10-CM

## 2013-06-23 DIAGNOSIS — I5022 Chronic systolic (congestive) heart failure: Secondary | ICD-10-CM

## 2013-06-23 DIAGNOSIS — I4891 Unspecified atrial fibrillation: Secondary | ICD-10-CM

## 2013-06-23 DIAGNOSIS — Z95818 Presence of other cardiac implants and grafts: Secondary | ICD-10-CM

## 2013-06-23 DIAGNOSIS — Z7901 Long term (current) use of anticoagulants: Secondary | ICD-10-CM | POA: Insufficient documentation

## 2013-06-23 DIAGNOSIS — I10 Essential (primary) hypertension: Secondary | ICD-10-CM

## 2013-06-23 DIAGNOSIS — I5023 Acute on chronic systolic (congestive) heart failure: Secondary | ICD-10-CM | POA: Insufficient documentation

## 2013-06-23 LAB — BASIC METABOLIC PANEL
BUN: 15 mg/dL (ref 6–23)
Calcium: 9.1 mg/dL (ref 8.4–10.5)
Creatinine, Ser: 0.75 mg/dL (ref 0.50–1.35)
GFR calc Af Amer: >90 mL/min (ref >90)
GFR calc non Af Amer: >90 mL/min (ref >90)
Glucose, Bld: 133 mg/dL — ABNORMAL HIGH (ref 70–99)
Potassium: 3.9 mEq/L (ref 3.5–5.1)

## 2013-06-23 LAB — CBC
HCT: 29 % — ABNORMAL LOW (ref 39.0–52.0)
Hemoglobin: 8.1 g/dL — ABNORMAL LOW (ref 13.0–17.0)
MCH: 30 pg (ref 26.0–34.0)
MCHC: 27.9 g/dL — ABNORMAL LOW (ref 30.0–36.0)
RDW: 19.1 % — ABNORMAL HIGH (ref 11.5–15.5)

## 2013-06-23 LAB — PROTIME-INR: INR: 2.24 — ABNORMAL HIGH (ref 0.00–1.49)

## 2013-06-23 MED ORDER — WARFARIN SODIUM 5 MG PO TABS: ORAL_TABLET | ORAL | Status: DC

## 2013-06-23 NOTE — Patient Instructions (Addendum)
1.  Return to VAD clinic in 6 weeks 2.  Will call you with INR results and coumadin dose and f/u

## 2013-06-23 NOTE — Patient Instructions (Addendum)
No change in coumadin dose

## 2013-06-24 ENCOUNTER — Other Ambulatory Visit (HOSPITAL_COMMUNITY): Payer: Self-pay | Admitting: *Deleted

## 2013-06-24 DIAGNOSIS — Z95811 Presence of heart assist device: Secondary | ICD-10-CM

## 2013-06-24 DIAGNOSIS — Z7901 Long term (current) use of anticoagulants: Secondary | ICD-10-CM

## 2013-06-28 ENCOUNTER — Ambulatory Visit (INDEPENDENT_AMBULATORY_CARE_PROVIDER_SITE_OTHER): Payer: Medicare Other | Admitting: *Deleted

## 2013-06-28 DIAGNOSIS — I4891 Unspecified atrial fibrillation: Secondary | ICD-10-CM

## 2013-06-29 LAB — REMOTE ICD DEVICE
BATTERY VOLTAGE: 3.098 V
BRDY-0002RV: 40 {beats}/min
CHARGE TIME: 9.549 s
PACEART VT: 0
RV LEAD AMPLITUDE: 5 mv
TOT-0006: 20110428000000
TZAT-0001FASTVT: 1
TZAT-0012FASTVT: 200 ms
TZAT-0012SLOWVT: 200 ms
TZAT-0012SLOWVT: 200 ms
TZAT-0013SLOWVT: 3
TZAT-0018SLOWVT: NEGATIVE
TZAT-0018SLOWVT: NEGATIVE
TZAT-0019FASTVT: 8 V
TZAT-0019SLOWVT: 8 V
TZAT-0019SLOWVT: 8 V
TZAT-0020FASTVT: 1.5 ms
TZAT-0020SLOWVT: 1.5 ms
TZAT-0020SLOWVT: 1.5 ms
TZON-0003SLOWVT: 310 ms
TZON-0003VSLOWVT: 410 ms
TZON-0004SLOWVT: 40
TZON-0004VSLOWVT: 48
TZON-0005SLOWVT: 24
TZST-0001FASTVT: 3
TZST-0001FASTVT: 4
TZST-0001FASTVT: 5
TZST-0001SLOWVT: 4
TZST-0001SLOWVT: 5
TZST-0002FASTVT: NEGATIVE
TZST-0002FASTVT: NEGATIVE
TZST-0003SLOWVT: 20 J
TZST-0003SLOWVT: 30 J
TZST-0003SLOWVT: 35 J

## 2013-06-29 NOTE — Progress Notes (Signed)
HPI:  [name] is a 72 year old gentleman with a history of CABG (2003), Chronic systolic HF EF 20-25% %, S/P AICD (generator change 2011), hyperlipidemia, hypothyroidism, emphysema, OSA, intermittent atrial fibrillation (on coumadin). Quit smoking 2003. Uses CPAP and O2 every night. He is a disabled [employer].   He is s/p LVAD HM II implanted 01/12/13  Follow up: Doing very well. Now working at the [employer]. Spirits better. No further confusion. Denies dyspnea or edema. He will only take lasix for weight 224 pounds or greater. Occasionally will take 80 mg if he feels fluid is really up. MAPs stable. No bleeding or fevers. Occasional PI events with LVAD.  Denies LVAD alarms. Denies driveline trauma, erythema or drainage. Pt is showering; wife is performing weekly dressing changes using sorbaview dressing. Denies ICD shocks. Reports taking Coumadin as prescribed and adherence to anticoagulation based dietary restrictions. Denies bright red blood per rectum or melena, no dark urine or hematuria   Symptom Yes No Details  Angina                 x Activity:  Claudication         x How far  Syncope         x When:   Stroke         x   Orthopnea         x How many pillows: 2 (comfort)  PND         x How often  CPAP         x  How many hrs:  All night  Pedal edema         x   Abd fullness         x   N&V         x   Good appetite; full meal  Diaphoresis         x When: night sweats (occasional)  Bleeding         x                                                          Urine - medium yellow  SOB         x  Activity: incline; walking "too fast"  Palpitations         x when  ICD shock         x   Hospitlizaitons         x When/where/why   ED visit         x When/where/why  Other MD         x When/who/why  Activity      No formal exercise program; sedentary  Fluid      No limitations  Diet      No limitations    Past Medical History  Diagnosis Date  . Ischemic cardiomyopathy       CABG 2003, PCI 2007  EF 27%(myoview 2012  . CHF (congestive heart failure)     class I-II  . Intestine disorder     blockage  . Hyperlipidemia   . Hypothyroidism   . Chronic anticoagulation   . Obesity   . COPD (chronic obstructive pulmonary disease)   . Asbestosis(501)     "6 years in  the [employer]" (05/24/2013)  . Atrial fibrillation     paroxysmal; on coumadin  . Paroxysmal ventricular tachycardia     has ICD in place  . Coronary artery disease   . Automatic implantable cardioverter-defibrillator in situ   . Hypertension   . Myocardial infarction 1990's-2000    "2 in  ~ the 1990's; 1 in ~ 2000" (05/24/2013)  . Exertional shortness of breath   . OSA on CPAP   . On home oxygen therapy     "2L q hs" (05/24/2013)  . Depression     Current Outpatient Prescriptions  Medication Sig Dispense Refill  . albuterol (PROVENTIL) (2.5 MG/3ML) 0.083% nebulizer solution Take 2.5 mg by nebulization every 6 (six) hours as needed for wheezing or shortness of breath.       [name] ascorbic acid (VITAMIN C) 500 MG tablet Take 500 mg by mouth 2 (two) times daily.      [name] aspirin EC 325 MG tablet Take 325 mg by mouth at bedtime.      . budesonide-formoterol (SYMBICORT) 160-4.5 MCG/ACT inhaler Inhale 2 puffs into the lungs 2 (two) times daily.      . Cholecalciferol 10000 UNITS CAPS Take 2 capsules by mouth at bedtime.      . citalopram (CELEXA) 40 MG tablet Take 40 mg by mouth at bedtime.      . cyanocobalamin 1000 MCG tablet Take 1,000 mcg by mouth at bedtime.       . docusate sodium (COLACE) 100 MG capsule Take 200 mg by mouth at bedtime.       . ferrous fumarate-b12-vitamic C-folic acid (TRINSICON / FOLTRIN) capsule Take 1 capsule by mouth 2 (two) times daily.      . furosemide (LASIX) 40 MG tablet Take 1 tablet (40 mg total) by mouth as needed (Weight 224 pounds or greater). Take with potassium  30 tablet  3  . levothyroxine (SYNTHROID, LEVOTHROID) 100 MCG tablet Take 50 mcg by mouth at bedtime.        . metoprolol succinate (TOPROL-XL) 25 MG 24 hr tablet Take 25 mg by mouth. 1/2 tab am and 1 tab pm      . pantoprazole (PROTONIX) 40 MG tablet Take 40 mg by mouth at bedtime.      . potassium chloride SA (K-DUR,KLOR-CON) 20 MEQ tablet Take 20 mEq by mouth. With furosemide      . simvastatin (ZOCOR) 80 MG tablet Take 40 mg by mouth at bedtime.        [name] tiotropium (SPIRIVA) 18 MCG inhalation capsule Place 18 mcg into inhaler and inhale daily.       [name] warfarin (COUMADIN) 5 MG tablet Take one tab daily or as needed  90 tablet  3  . nitroGLYCERIN (NITROSTAT) 0.4 MG SL tablet Place 0.4 mg under the tongue every 5 (five) minutes as needed.         No current facility-administered medications for this encounter.    Review of patient's allergies indicates no known allergies.  REVIEW OF SYSTEMS: All systems negative except as listed in HPI, PMH and Problem list.   LVAD INTERROGATION:  Flow:  5.0 Speed:  9180 PI:  6.1 Power:  5.3 Alarms:  None Events:  Rare PI Fixed speed:  9200 Low speed limit: 8600   I reviewed the LVAD parameters from today, and compared the results to the patient's prior recorded data.  No programming changes were made.  The LVAD is functioning within specified parameters.  The patient performs  LVAD self-test daily.  LVAD interrogation was negative for any significant power changes or alarms. There were a few PI events daily, but nothing concerning and none back to back. LVAD equipment check completed and is in good working order.  Back-up equipment present.   LVAD education done on emergency procedures and precautions and reviewed exit site care.  Physical Exam: Filed Vitals:   06/23/13 1104  BP: 84/0  Pulse: 76  Resp: 20  Height: 6' (1.829 m)  Weight: 229 lb 1.9 oz (103.928 kg)  SpO2: 98%  BP:  84/0 Last weight: 226.8 lbs    GENERAL: Well appearing, male; NAD; wife present HEENT: normal  NECK: Supple, JVP 8-9. 2+ bilaterally, no bruits.  No lymphadenopathy  or thyromegaly appreciated.   CARDIAC:  Mechanical heart sounds with LVAD hum present.  LUNGS:  Clear to auscultation bilaterally.  ABDOMEN:  Soft, round, nontender, positive bowel sounds x4.     LVAD exit site: well-healed and incorporated.  Dressing dry and intact.  No erythema, drainage, odor or tenderness.  Stabilization device present and accurately applied.  Driveline dressing is being changed daily per sterile technique. Changed in clinic EXTREMITIES:  Warm and dry, no cyanosis, clubbing, rash or  edema  NEUROLOGIC:  Alert and oriented x 4.  Gait steady.  No aphasia.  No dysarthria.  Affect pleasant.      ASSESSMENT AND PLAN:   1) Chronic Systolic HF, s/p LVAD implant 10/6107 -doing very well. Will continue current regimen.  -we reviewed sliding scale regimen and he is doing well with it. -MAP looks good as does driveline -occasional PI events  2)  Anticoagulation management - INR goal 2.0-3.0. --check INR today. 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 No major events.  6) Depression - much improved with Celexa. Congratulated him on getting a side job.   [name]  5:42 PM

## 2013-07-05 ENCOUNTER — Other Ambulatory Visit: Payer: Self-pay | Admitting: *Deleted

## 2013-07-05 DIAGNOSIS — Z95811 Presence of heart assist device: Secondary | ICD-10-CM

## 2013-07-05 DIAGNOSIS — D649 Anemia, unspecified: Secondary | ICD-10-CM

## 2013-07-07 ENCOUNTER — Ambulatory Visit (INDEPENDENT_AMBULATORY_CARE_PROVIDER_SITE_OTHER): Payer: Medicare Other | Admitting: *Deleted

## 2013-07-07 ENCOUNTER — Ambulatory Visit (HOSPITAL_COMMUNITY): Payer: Self-pay | Admitting: *Deleted

## 2013-07-07 ENCOUNTER — Encounter (HOSPITAL_COMMUNITY): Payer: Self-pay | Admitting: Internal Medicine

## 2013-07-07 DIAGNOSIS — Z7901 Long term (current) use of anticoagulants: Secondary | ICD-10-CM

## 2013-07-07 DIAGNOSIS — Z95811 Presence of heart assist device: Secondary | ICD-10-CM

## 2013-07-07 DIAGNOSIS — D649 Anemia, unspecified: Secondary | ICD-10-CM

## 2013-07-07 DIAGNOSIS — I5022 Chronic systolic (congestive) heart failure: Secondary | ICD-10-CM

## 2013-07-07 DIAGNOSIS — Z95818 Presence of other cardiac implants and grafts: Secondary | ICD-10-CM

## 2013-07-07 LAB — CBC WITH DIFFERENTIAL/PLATELET
Eosinophils Absolute: 0.1 10*3/uL (ref 0.0–0.7)
Eosinophils Relative: 2 % (ref 0.0–5.0)
HCT: 28.8 % — ABNORMAL LOW (ref 39.0–52.0)
Lymphs Abs: 0.8 10*3/uL (ref 0.7–4.0)
MCHC: 30.5 g/dL (ref 30.0–36.0)
MCV: 101.6 fl — ABNORMAL HIGH (ref 78.0–100.0)
Monocytes Absolute: 0.6 10*3/uL (ref 0.1–1.0)
Monocytes Relative: 8.1 % (ref 3.0–12.0)
Neutrophils Relative %: 78.9 % — ABNORMAL HIGH (ref 43.0–77.0)
Platelets: 144 10*3/uL — ABNORMAL LOW (ref 150.0–400.0)
WBC: 7.6 10*3/uL (ref 4.5–10.5)

## 2013-07-07 LAB — PROTIME-INR
INR: 2.7 ratio — ABNORMAL HIGH (ref 0.8–1.0)
Prothrombin Time: 28.2 s — ABNORMAL HIGH (ref 10.2–12.4)

## 2013-07-07 NOTE — Patient Instructions (Signed)
No change in coumadin dose

## 2013-07-09 ENCOUNTER — Other Ambulatory Visit (HOSPITAL_COMMUNITY): Payer: Self-pay | Admitting: *Deleted

## 2013-07-09 DIAGNOSIS — Z95811 Presence of heart assist device: Secondary | ICD-10-CM

## 2013-07-09 DIAGNOSIS — Z7901 Long term (current) use of anticoagulants: Secondary | ICD-10-CM

## 2013-07-19 ENCOUNTER — Telehealth (HOSPITAL_COMMUNITY): Payer: Self-pay | Admitting: *Deleted

## 2013-07-19 DIAGNOSIS — Z7901 Long term (current) use of anticoagulants: Secondary | ICD-10-CM

## 2013-07-19 DIAGNOSIS — Z95811 Presence of heart assist device: Secondary | ICD-10-CM

## 2013-07-20 NOTE — Telephone Encounter (Signed)
Pt's wife called Friday 07/16/13 asking if pt could get INR checks less frequently (has been getting every two weeks).  Reviewed with Ulla Potash on Monday 07/19/13; called pt and re-scheduled INR from 07/21/13 to 07/28/13. Pt verbalized understanding of same.

## 2013-07-21 ENCOUNTER — Other Ambulatory Visit: Payer: Medicare Other

## 2013-07-21 ENCOUNTER — Encounter: Payer: Self-pay | Admitting: Internal Medicine

## 2013-07-28 ENCOUNTER — Ambulatory Visit (HOSPITAL_COMMUNITY): Payer: Self-pay | Admitting: *Deleted

## 2013-07-28 ENCOUNTER — Ambulatory Visit (INDEPENDENT_AMBULATORY_CARE_PROVIDER_SITE_OTHER): Payer: Medicare Other | Admitting: *Deleted

## 2013-07-28 DIAGNOSIS — Z95818 Presence of other cardiac implants and grafts: Secondary | ICD-10-CM

## 2013-07-28 DIAGNOSIS — Z7901 Long term (current) use of anticoagulants: Secondary | ICD-10-CM

## 2013-07-28 DIAGNOSIS — Z95811 Presence of heart assist device: Secondary | ICD-10-CM

## 2013-07-28 LAB — PROTIME-INR: INR: 2.5 ratio — ABNORMAL HIGH (ref 0.8–1.0)

## 2013-07-28 NOTE — Patient Instructions (Signed)
No change in coumadin dose

## 2013-08-03 ENCOUNTER — Other Ambulatory Visit (HOSPITAL_COMMUNITY): Payer: Self-pay | Admitting: *Deleted

## 2013-08-03 DIAGNOSIS — Z7901 Long term (current) use of anticoagulants: Secondary | ICD-10-CM

## 2013-08-03 DIAGNOSIS — Z95811 Presence of heart assist device: Secondary | ICD-10-CM

## 2013-08-04 ENCOUNTER — Ambulatory Visit (HOSPITAL_COMMUNITY)
Admission: RE | Admit: 2013-08-04 | Discharge: 2013-08-04 | Disposition: A | Discharge status: Home / Self Care | Payer: Medicare Other | Source: Ambulatory Visit | Attending: Internal Medicine | Admitting: Internal Medicine

## 2013-08-04 ENCOUNTER — Ambulatory Visit (HOSPITAL_COMMUNITY): Payer: Self-pay | Admitting: *Deleted

## 2013-08-04 ENCOUNTER — Other Ambulatory Visit (HOSPITAL_COMMUNITY): Payer: Self-pay | Admitting: *Deleted

## 2013-08-04 VITALS — BP 86/0 | HR 88 | Ht 73.0 in | Wt 227.0 lb

## 2013-08-04 DIAGNOSIS — Z7901 Long term (current) use of anticoagulants: Secondary | ICD-10-CM

## 2013-08-04 DIAGNOSIS — Z95818 Presence of other cardiac implants and grafts: Secondary | ICD-10-CM | POA: Diagnosis not present

## 2013-08-04 DIAGNOSIS — I255 Ischemic cardiomyopathy: Secondary | ICD-10-CM

## 2013-08-04 DIAGNOSIS — Z95811 Presence of heart assist device: Secondary | ICD-10-CM

## 2013-08-04 DIAGNOSIS — I2589 Other forms of chronic ischemic heart disease: Secondary | ICD-10-CM | POA: Insufficient documentation

## 2013-08-04 LAB — CBC
HCT: 33.1 % — ABNORMAL LOW (ref 39.0–52.0)
MCH: 32 pg (ref 26.0–34.0)
MCHC: 29.9 g/dL — ABNORMAL LOW (ref 30.0–36.0)
RDW: 15.4 % (ref 11.5–15.5)

## 2013-08-04 LAB — BASIC METABOLIC PANEL
BUN: 13 mg/dL (ref 6–23)
Calcium: 9.6 mg/dL (ref 8.4–10.5)
Creatinine, Ser: 0.94 mg/dL (ref 0.50–1.35)
GFR calc Af Amer: >90 mL/min (ref >90)
GFR calc non Af Amer: 82 mL/min — ABNORMAL LOW (ref >90)
Potassium: 4.1 mEq/L (ref 3.5–5.1)

## 2013-08-04 LAB — LACTATE DEHYDROGENASE: LDH: 285 U/L — ABNORMAL HIGH (ref 94–250)

## 2013-08-04 LAB — PROTIME-INR
INR: 2.24 — ABNORMAL HIGH (ref 0.00–1.49)
Prothrombin Time: 24.1 seconds — ABNORMAL HIGH (ref 11.6–15.2)

## 2013-08-04 NOTE — Patient Instructions (Signed)
1.  No med changes today 2.  Follow up with primary care physician for dermatology consult 3.  INR check 08/25/13 4.  Return to VAD clinic in 2 mos

## 2013-08-04 NOTE — Progress Notes (Signed)
HPI:  [name] is a 72 year old gentleman with a history of CABG (2003), Chronic systolic HF EF 20-25% %, S/P AICD (generator change 2011), hyperlipidemia, hypothyroidism, emphysema, OSA, intermittent atrial fibrillation (on coumadin). Quit smoking 2003. Uses CPAP and O2 every night. He is a disabled [employer].   He is s/p LVAD HM II implanted 01/12/13  Follow up: Doing very well. Now working at the [employer]. Spirits better. No further confusion. Denies dyspnea or edema. He will only take lasix for weight 224 pounds or greater. States he is taking 20 mg @ once weekly. MAPs stable. No bleeding or fevers. Occasional PI events with LVAD.  Denies LVAD alarms. Denies driveline trauma, erythema or drainage. Denies ICD shocks. Reports taking Coumadin as prescribed and adherence to anticoagulation based dietary restrictions. Denies bright red blood per rectum or melena, no dark urine or hematuria  Labs (10/14): hemoglobin 8.8, K 3.9, creatinine 0.75, LDH 283   Symptom Yes No Details  Angina                 x Activity:  Claudication         x How far  Syncope         x When:   Stroke         x   Orthopnea         x How many pillows: 2 (comfort)  PND         x How often  CPAP         x  How many hrs:  All night  Pedal edema         x   Abd fullness         x   N&V         x   Good appetite; full meal  Diaphoresis         x When: night sweats (occasional)  Bleeding         x                                                          Urine - medium yellow  SOB         x  Activity: incline; walking "too fast"  Palpitations         x when  ICD shock         x   Hospitlizaitons         x When/where/why   ED visit         x When/where/why  Other MD         x When/who/why  Activity      No formal exercise program; sedentary  Fluid      No limitations  Diet      No limitations    Past Medical History  Diagnosis Date  . Ischemic cardiomyopathy      CABG 2003, PCI 2007  EF 27%(myoview  2012  . CHF (congestive heart failure)     class I-II  . Intestine disorder     blockage  . Hyperlipidemia   . Hypothyroidism   . Chronic anticoagulation   . Obesity   . COPD (chronic obstructive pulmonary disease)   . 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  Atrial fibrillation     paroxysmal; on coumadin  . Paroxysmal ventricular tachycardia     has ICD in place  . Coronary artery disease   . Automatic implantable cardioverter-defibrillator in situ   . Hypertension   . Myocardial infarction 1990's-2000    "2 in  ~ the 1990's; 1 in ~ 2000" (05/24/2013)  . Exertional shortness of breath   . OSA on CPAP   . On home oxygen therapy     "2L q hs" (05/24/2013)  . Depression     Current Outpatient Prescriptions  Medication Sig Dispense Refill  . albuterol (PROVENTIL) (2.5 MG/3ML) 0.083% nebulizer solution Take 2.5 mg by nebulization every 6 (six) hours as needed for wheezing or shortness of breath.       [name] ascorbic acid (VITAMIN C) 500 MG tablet Take 500 mg by mouth 2 (two) times daily.      [name] aspirin EC 325 MG tablet Take 325 mg by mouth at bedtime.      . budesonide-formoterol (SYMBICORT) 160-4.5 MCG/ACT inhaler Inhale 2 puffs into the lungs 2 (two) times daily.      . Cholecalciferol 10000 UNITS CAPS Take 2 capsules by mouth at bedtime.      . citalopram (CELEXA) 40 MG tablet Take 40 mg by mouth at bedtime.      . cyanocobalamin 1000 MCG tablet Take 1,000 mcg by mouth at bedtime.       . docusate sodium (COLACE) 100 MG capsule Take 200 mg by mouth at bedtime.       . ferrous fumarate-b12-vitamic C-folic acid (TRINSICON / FOLTRIN) capsule Take 1 capsule by mouth 2 (two) times daily.      . furosemide (LASIX) 40 MG tablet Take 1 tablet (40 mg total) by mouth as needed (Weight 224 pounds or greater). Take with potassium  30 tablet  3  . levothyroxine (SYNTHROID, LEVOTHROID) 100 MCG tablet Take 50 mcg by mouth at bedtime.       . metoprolol succinate (TOPROL-XL) 25 MG  24 hr tablet Take 25 mg by mouth. 1/2 tab am and 1 tab pm      . nitroGLYCERIN (NITROSTAT) 0.4 MG SL tablet Place 0.4 mg under the tongue every 5 (five) minutes as needed.        . pantoprazole (PROTONIX) 40 MG tablet Take 40 mg by mouth at bedtime.      . potassium chloride SA (K-DUR,KLOR-CON) 20 MEQ tablet Take 20 mEq by mouth. With furosemide      . simvastatin (ZOCOR) 80 MG tablet Take 40 mg by mouth at bedtime.        [name] tiotropium (SPIRIVA) 18 MCG inhalation capsule Place 18 mcg into inhaler and inhale daily.       [name] warfarin (COUMADIN) 5 MG tablet Take one tab daily or as needed  90 tablet  3   No current facility-administered medications for this encounter.    Review of patient's allergies indicates no known allergies.  REVIEW OF SYSTEMS: All systems negative except as listed in HPI, PMH and Problem list.   LVAD INTERROGATION:  Flow:  4.9 Speed:  9180 PI:  6.4 Power:  5.3 Alarms:  None Events:  Rare PI Fixed speed:  9200 Low speed limit: 8600   I reviewed the LVAD parameters from today, and compared the results to the patient's prior recorded data.  No programming changes were made.  The LVAD is functioning within specified parameters.  The patient performs LVAD self-test daily.  LVAD  interrogation was negative for any significant power changes or alarms. There were a few PI events daily, but nothing concerning and none back to back. LVAD equipment check completed and is in good working order.  Back-up equipment present.   LVAD education done on emergency procedures and precautions and reviewed exit site care.  Physical Exam: Ht:  6'1" Wt:  227 lbs Last weight: 229 lbs BP:  86 HR:  88 O2 sat:  97%  GENERAL: Well appearing, male; NAD; wife present HEENT: normal  NECK: Supple, JVP 7 cm.  No carotid bruit.  No lymphadenopathy or thyromegaly appreciated.   CARDIAC:  Mechanical heart sounds with LVAD hum present.  No peripheral edema.  LUNGS:  Clear to auscultation  bilaterally.  ABDOMEN:  Soft, round, nontender, positive bowel sounds x4.     LVAD exit site: well-healed and incorporated.  Dressing dry and intact.  No erythema, drainage, odor or tenderness.  Stabilization device present and accurately applied.  Driveline dressing is being changed daily per sterile technique. Changed in clinic EXTREMITIES:  Warm and dry, no cyanosis, clubbing, rash.  NEUROLOGIC:  Alert and oriented x 4.  Gait steady.  No aphasia.  No dysarthria.  Affect pleasant.      ASSESSMENT AND PLAN:   1) Chronic Systolic HF, s/p LVAD implant 0/9811 -doing very well. Will continue current regimen.  -MAP looks good as does driveline -rare PI events  2)  Anticoagulation management - INR goal 2.0-3.0. 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 No major events.  6) Depression - much improved with Celexa. Congratulated him on getting a side job.   Hessie Diener  11:39 AM  Patient seen with VAD coordinator, agree with the above note.  Patient is doing quite well symptomatically.  Weight is stable.  Few PI events on LVAD interrogation.  Stable anemia. No changes today, 2 month followup.   Marca Ancona 08/04/2013

## 2013-08-04 NOTE — Progress Notes (Signed)
Pt is showering; wife is performing weekly dressing changes using sorbaview dressing. Drive line anchor device in place. Pt denies fever or chills. Pt/caregiver deny any alarms or VAD equipment issues. VAD coordinator reviewed daily log from home for daily temperature, weight, and VAD parameters. Pt is performing daily controller and system monitor self tests along with completing weekly and monthly maintenance for LVAD equipment.   LVAD equipment check completed and is in good working order.  Back-up equipment present.   LVAD education done on emergency procedures and precautions and reviewed exit site care.    Patient currently has two pocket controllers version v7.19:  Primary SN:  [id number]  Secondary SN:  [id number]  Per [employer] recommendation, controllers replaced with updated version v7.23 controller.  Primary controller change out completed by VAD coordinator with no adverse affects. New controllers with settings as below:  Primary SN:  [id number]  Secondary SN:  [id number]  Fixed speed:  9200  Low speed limit:  8600

## 2013-08-05 ENCOUNTER — Other Ambulatory Visit: Payer: Self-pay

## 2013-08-05 ENCOUNTER — Other Ambulatory Visit (HOSPITAL_COMMUNITY): Payer: Self-pay | Admitting: *Deleted

## 2013-08-05 DIAGNOSIS — Z95811 Presence of heart assist device: Secondary | ICD-10-CM

## 2013-08-05 DIAGNOSIS — Z7901 Long term (current) use of anticoagulants: Secondary | ICD-10-CM

## 2013-08-06 ENCOUNTER — Encounter: Payer: Self-pay | Admitting: Internal Medicine

## 2013-08-25 ENCOUNTER — Other Ambulatory Visit (INDEPENDENT_AMBULATORY_CARE_PROVIDER_SITE_OTHER): Payer: Medicare Other

## 2013-08-25 ENCOUNTER — Ambulatory Visit (HOSPITAL_COMMUNITY): Payer: Self-pay | Admitting: *Deleted

## 2013-08-25 DIAGNOSIS — D649 Anemia, unspecified: Secondary | ICD-10-CM

## 2013-08-25 DIAGNOSIS — Z7901 Long term (current) use of anticoagulants: Secondary | ICD-10-CM

## 2013-08-25 DIAGNOSIS — I5022 Chronic systolic (congestive) heart failure: Secondary | ICD-10-CM

## 2013-08-25 DIAGNOSIS — Z95811 Presence of heart assist device: Secondary | ICD-10-CM

## 2013-08-25 DIAGNOSIS — Z95818 Presence of other cardiac implants and grafts: Secondary | ICD-10-CM

## 2013-08-25 LAB — PROTIME-INR: Prothrombin Time: 27.1 s — ABNORMAL HIGH (ref 10.2–12.4)

## 2013-09-13 ENCOUNTER — Encounter (HOSPITAL_COMMUNITY): Payer: Self-pay | Admitting: Emergency Medicine

## 2013-09-13 ENCOUNTER — Encounter (HOSPITAL_COMMUNITY): Payer: Medicare Other

## 2013-09-13 ENCOUNTER — Inpatient Hospital Stay (HOSPITAL_COMMUNITY)
Admission: EM | Admit: 2013-09-13 | Discharge: 2013-09-14 | DRG: 293 | Disposition: A | Discharge status: Home / Self Care | Payer: Non-veteran care | Attending: Internal Medicine | Admitting: Internal Medicine

## 2013-09-13 ENCOUNTER — Emergency Department (HOSPITAL_COMMUNITY): Payer: Non-veteran care

## 2013-09-13 DIAGNOSIS — J449 Chronic obstructive pulmonary disease, unspecified: Secondary | ICD-10-CM | POA: Diagnosis present

## 2013-09-13 DIAGNOSIS — I5023 Acute on chronic systolic (congestive) heart failure: Principal | ICD-10-CM | POA: Diagnosis present

## 2013-09-13 DIAGNOSIS — I2510 Atherosclerotic heart disease of native coronary artery without angina pectoris: Secondary | ICD-10-CM | POA: Diagnosis present

## 2013-09-13 DIAGNOSIS — Z87891 Personal history of nicotine dependence: Secondary | ICD-10-CM

## 2013-09-13 DIAGNOSIS — E669 Obesity, unspecified: Secondary | ICD-10-CM | POA: Diagnosis present

## 2013-09-13 DIAGNOSIS — F3289 Other specified depressive episodes: Secondary | ICD-10-CM | POA: Diagnosis present

## 2013-09-13 DIAGNOSIS — F329 Major depressive disorder, single episode, unspecified: Secondary | ICD-10-CM | POA: Diagnosis present

## 2013-09-13 DIAGNOSIS — Z951 Presence of aortocoronary bypass graft: Secondary | ICD-10-CM

## 2013-09-13 DIAGNOSIS — E785 Hyperlipidemia, unspecified: Secondary | ICD-10-CM | POA: Diagnosis present

## 2013-09-13 DIAGNOSIS — I509 Heart failure, unspecified: Secondary | ICD-10-CM | POA: Diagnosis present

## 2013-09-13 DIAGNOSIS — I5022 Chronic systolic (congestive) heart failure: Secondary | ICD-10-CM | POA: Diagnosis present

## 2013-09-13 DIAGNOSIS — I252 Old myocardial infarction: Secondary | ICD-10-CM | POA: Diagnosis present

## 2013-09-13 DIAGNOSIS — J4489 Other specified chronic obstructive pulmonary disease: Secondary | ICD-10-CM | POA: Diagnosis present

## 2013-09-13 DIAGNOSIS — Z95811 Presence of heart assist device: Secondary | ICD-10-CM

## 2013-09-13 DIAGNOSIS — Z95818 Presence of other cardiac implants and grafts: Secondary | ICD-10-CM

## 2013-09-13 DIAGNOSIS — Z8249 Family history of ischemic heart disease and other diseases of the circulatory system: Secondary | ICD-10-CM

## 2013-09-13 DIAGNOSIS — E876 Hypokalemia: Secondary | ICD-10-CM | POA: Diagnosis not present

## 2013-09-13 DIAGNOSIS — Z7901 Long term (current) use of anticoagulants: Secondary | ICD-10-CM

## 2013-09-13 DIAGNOSIS — G4733 Obstructive sleep apnea (adult) (pediatric): Secondary | ICD-10-CM | POA: Diagnosis present

## 2013-09-13 DIAGNOSIS — Z7982 Long term (current) use of aspirin: Secondary | ICD-10-CM

## 2013-09-13 DIAGNOSIS — E039 Hypothyroidism, unspecified: Secondary | ICD-10-CM | POA: Diagnosis present

## 2013-09-13 DIAGNOSIS — D649 Anemia, unspecified: Secondary | ICD-10-CM

## 2013-09-13 DIAGNOSIS — Z6827 Body mass index (BMI) 27.0-27.9, adult: Secondary | ICD-10-CM

## 2013-09-13 DIAGNOSIS — I2589 Other forms of chronic ischemic heart disease: Secondary | ICD-10-CM | POA: Diagnosis present

## 2013-09-13 DIAGNOSIS — I10 Essential (primary) hypertension: Secondary | ICD-10-CM | POA: Diagnosis present

## 2013-09-13 DIAGNOSIS — Z95810 Presence of automatic (implantable) cardiac defibrillator: Secondary | ICD-10-CM

## 2013-09-13 DIAGNOSIS — I4891 Unspecified atrial fibrillation: Secondary | ICD-10-CM | POA: Diagnosis present

## 2013-09-13 DIAGNOSIS — R531 Weakness: Secondary | ICD-10-CM

## 2013-09-13 DIAGNOSIS — R0600 Dyspnea, unspecified: Secondary | ICD-10-CM

## 2013-09-13 LAB — CBC
HCT: 26.9 % — ABNORMAL LOW (ref 39.0–52.0)
Hemoglobin: 7.7 g/dL — ABNORMAL LOW (ref 13.0–17.0)
MCH: 29.2 pg (ref 26.0–34.0)
MCV: 101.9 fL — ABNORMAL HIGH (ref 78.0–100.0)
Platelets: 200 10*3/uL (ref 150–400)
RBC: 2.64 MIL/uL — ABNORMAL LOW (ref 4.22–5.81)
RDW: 18.3 % — ABNORMAL HIGH (ref 11.5–15.5)
WBC: 9.8 10*3/uL (ref 4.0–10.5)

## 2013-09-13 LAB — COMPREHENSIVE METABOLIC PANEL
AST: 20 U/L (ref 0–37)
BUN: 19 mg/dL (ref 6–23)
CO2: 22 mEq/L (ref 19–32)
Calcium: 9.9 mg/dL (ref 8.4–10.5)
Chloride: 105 mEq/L (ref 96–112)
Creatinine, Ser: 0.77 mg/dL (ref 0.50–1.35)
GFR calc non Af Amer: 89 mL/min — ABNORMAL LOW (ref >90)
Potassium: 4.3 mEq/L (ref 3.5–5.1)
Total Bilirubin: 0.7 mg/dL (ref 0.3–1.2)

## 2013-09-13 LAB — PREPARE RBC (CROSSMATCH)

## 2013-09-13 LAB — OCCULT BLOOD, POC DEVICE: Fecal Occult Bld: NEGATIVE

## 2013-09-13 LAB — PROTIME-INR
INR: 2.66 — ABNORMAL HIGH (ref 0.00–1.49)
Prothrombin Time: 27.4 seconds — ABNORMAL HIGH (ref 11.6–15.2)

## 2013-09-13 LAB — TROPONIN I: Troponin I: <0.3 ng/mL (ref <0.30)

## 2013-09-13 MED ORDER — LEVOTHYROXINE SODIUM 50 MCG PO TABS
50.0000 ug | ORAL_TABLET | Freq: Every day | ORAL | Status: DC
  Administered 2013-09-13: 50 ug via ORAL
  Filled 2013-09-13 (×2): qty 1

## 2013-09-13 MED ORDER — SODIUM CHLORIDE 0.9 % IJ SOLN: 3.0000 mL | INTRAMUSCULAR | Status: DC | PRN

## 2013-09-13 MED ORDER — SODIUM CHLORIDE 0.9 % IJ SOLN
3.0000 mL | Freq: Two times a day (BID) | INTRAMUSCULAR | Status: DC
  Administered 2013-09-13: 3 mL via INTRAVENOUS

## 2013-09-13 MED ORDER — DOCUSATE SODIUM 100 MG PO CAPS
200.0000 mg | ORAL_CAPSULE | Freq: Every day | ORAL | Status: DC
  Administered 2013-09-13: 200 mg via ORAL
  Filled 2013-09-13 (×2): qty 2

## 2013-09-13 MED ORDER — ATORVASTATIN CALCIUM 40 MG PO TABS
40.0000 mg | ORAL_TABLET | Freq: Every day | ORAL | Status: DC
  Administered 2013-09-13: 40 mg via ORAL
  Filled 2013-09-13 (×2): qty 1

## 2013-09-13 MED ORDER — DOCUSATE SODIUM 100 MG PO CAPS: 200.0000 mg | ORAL_CAPSULE | Freq: Every day | ORAL | Status: DC

## 2013-09-13 MED ORDER — METOPROLOL SUCCINATE 12.5 MG HALF TABLET: 12.5000 mg | ORAL_TABLET | Freq: Two times a day (BID) | ORAL | Status: DC

## 2013-09-13 MED ORDER — FUROSEMIDE 10 MG/ML IJ SOLN
80.0000 mg | Freq: Once | INTRAMUSCULAR | Status: AC
  Administered 2013-09-13: 80 mg via INTRAVENOUS
  Filled 2013-09-13: qty 8

## 2013-09-13 MED ORDER — BUDESONIDE-FORMOTEROL FUMARATE 160-4.5 MCG/ACT IN AERO
2.0000 | INHALATION_SPRAY | Freq: Two times a day (BID) | RESPIRATORY_TRACT | Status: DC
  Administered 2013-09-13 – 2013-09-14 (×2): 2 via RESPIRATORY_TRACT
  Filled 2013-09-13: qty 6

## 2013-09-13 MED ORDER — METOPROLOL SUCCINATE 12.5 MG HALF TABLET
12.5000 mg | ORAL_TABLET | Freq: Every morning | ORAL | Status: DC
  Administered 2013-09-14: 12.5 mg via ORAL
  Filled 2013-09-13: qty 1

## 2013-09-13 MED ORDER — PANTOPRAZOLE SODIUM 40 MG PO TBEC
40.0000 mg | DELAYED_RELEASE_TABLET | Freq: Every day | ORAL | Status: DC
  Administered 2013-09-13: 40 mg via ORAL
  Filled 2013-09-13: qty 1

## 2013-09-13 MED ORDER — METOPROLOL SUCCINATE ER 25 MG PO TB24
25.0000 mg | ORAL_TABLET | Freq: Every evening | ORAL | Status: DC
  Administered 2013-09-13: 25 mg via ORAL
  Filled 2013-09-13 (×2): qty 1

## 2013-09-13 MED ORDER — CITALOPRAM HYDROBROMIDE 40 MG PO TABS
40.0000 mg | ORAL_TABLET | Freq: Every day | ORAL | Status: DC
  Administered 2013-09-13: 40 mg via ORAL
  Filled 2013-09-13 (×2): qty 1

## 2013-09-13 MED ORDER — SODIUM CHLORIDE 0.9 % IV SOLN: 250.0000 mL | INTRAVENOUS | Status: DC | PRN

## 2013-09-13 MED ORDER — FE FUMARATE-B12-VIT C-FA-IFC PO CAPS
1.0000 | ORAL_CAPSULE | Freq: Two times a day (BID) | ORAL | Status: DC
  Administered 2013-09-13 – 2013-09-14 (×2): 1 via ORAL
  Filled 2013-09-13 (×3): qty 1

## 2013-09-13 MED ORDER — VITAMIN B-12 1000 MCG PO TABS
1000.0000 ug | ORAL_TABLET | Freq: Every day | ORAL | Status: DC
  Filled 2013-09-13: qty 1

## 2013-09-13 MED ORDER — ASPIRIN EC 325 MG PO TBEC
325.0000 mg | DELAYED_RELEASE_TABLET | Freq: Every day | ORAL | Status: DC
  Administered 2013-09-13: 325 mg via ORAL
  Filled 2013-09-13 (×2): qty 1

## 2013-09-13 MED ORDER — WARFARIN SODIUM 5 MG PO TABS
5.0000 mg | ORAL_TABLET | Freq: Every day | ORAL | Status: DC
  Filled 2013-09-13 (×2): qty 1

## 2013-09-13 MED ORDER — ALBUTEROL SULFATE (5 MG/ML) 0.5% IN NEBU: 2.5000 mg | INHALATION_SOLUTION | Freq: Four times a day (QID) | RESPIRATORY_TRACT | Status: DC | PRN

## 2013-09-13 MED ORDER — WARFARIN - PHARMACIST DOSING INPATIENT: Freq: Every day | Status: DC

## 2013-09-13 MED ORDER — POTASSIUM CHLORIDE CRYS ER 20 MEQ PO TBCR
20.0000 meq | EXTENDED_RELEASE_TABLET | Freq: Every day | ORAL | Status: DC
  Administered 2013-09-14: 20 meq via ORAL
  Filled 2013-09-13: qty 1

## 2013-09-13 MED ORDER — WARFARIN SODIUM 5 MG PO TABS: 5.0000 mg | ORAL_TABLET | Freq: Every day | ORAL | Status: DC

## 2013-09-13 MED ORDER — TIOTROPIUM BROMIDE MONOHYDRATE 18 MCG IN CAPS
18.0000 ug | ORAL_CAPSULE | Freq: Every day | RESPIRATORY_TRACT | Status: DC
  Administered 2013-09-14: 18 ug via RESPIRATORY_TRACT
  Filled 2013-09-13: qty 5

## 2013-09-13 NOTE — ED Notes (Signed)
Patient transported to X-ray with this nurse

## 2013-09-13 NOTE — ED Provider Notes (Signed)
CSN: [id number]     Arrival date & time 09/13/13  1312 History   First MD Initiated Contact with Patient 09/13/13 1326     Chief Complaint  Patient presents with  . Shortness of Breath   (Consider location/radiation/quality/duration/timing/severity/associated sxs/prior Treatment) Patient is a 72 y.o. male presenting with shortness of breath. The history is provided by the patient and the spouse.  Shortness of Breath Associated symptoms: no abdominal pain, no chest pain, no cough, no fever, no headaches, no neck pain, no rash, no sore throat and no vomiting   pt w hx cardiomyopathy, LVAD 4/14, presents stating not much energy and feeling sob in past week. Gradual onset. Constant. Denies specific exacerbating or alleviating factors. No cough or uri c/o. No fever or chills. No chest pain or discomfort. No leg swelling. No pnd. Weight has been stable. States LVAD has been functioning normally without complication. Denies any recent change in meds, compliant w normal meds.  Pt does note hx anemia, on iron, prior transfusion - denies any recent blood loss, rectal bleeding or melena. Has had normal appetite. No nvd. No dysuria or gu c/o.      Past Medical History  Diagnosis Date  . Ischemic cardiomyopathy      CABG 2003, PCI 2007  EF 27%(myoview 2012  . CHF (congestive heart failure)     class I-II  . Intestine disorder     blockage  . Hyperlipidemia   . Hypothyroidism   . Chronic anticoagulation   . Obesity   . COPD (chronic obstructive pulmonary disease)   . 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 Atrial fibrillation     paroxysmal; on coumadin  . Paroxysmal ventricular tachycardia     has ICD in place  . Coronary artery disease   . Automatic implantable cardioverter-defibrillator in situ   . Hypertension   . Myocardial infarction 1990's-2000    "2 in  ~ the 1990's; 1 in ~ 2000" (05/24/2013)  . Exertional shortness of breath   . OSA on CPAP   . On home oxygen  therapy     "2L q hs" (05/24/2013)  . Depression    Past Surgical History  Procedure Laterality Date  . Coronary artery bypass graft  2003    "?X4" (05/24/2013)  . Cardiac defibrillator placement  2004; ~ 2010  . Insertion of implantable left ventricular assist device N/A 01/12/2013    Procedure: INSERTION OF IMPLANTABLE LEFT VENTRICULAR ASSIST DEVICE;  Surgeon: Kerin Perna, MD;  Location: [facility] OR;  Service: Open Heart Surgery;  Laterality: N/A;   nitric oxide; Redo sternotomy  . Intraoperative transesophageal echocardiogram N/A 01/12/2013    Procedure: INTRAOPERATIVE TRANSESOPHAGEAL ECHOCARDIOGRAM;  Surgeon: Kerin Perna, MD;  Location: [facility] OR;  Service: Open Heart Surgery;  Laterality: N/A;   Family History  Problem Relation Age of Onset  . Heart attack Mother   . Heart attack Father    History  Substance Use Topics  . Smoking status: Former Smoker -- 2.00 packs/day for 45 years    Types: Cigarettes    Quit date: 11/11/2001  . Smokeless tobacco: Never Used  . Alcohol Use: Yes     Comment: 05/24/2013 "use to drink beer; hardly nothing since 2003; once in awhile a beer "    Review of Systems  Constitutional: Negative for fever and chills.  HENT: Negative for sore throat.   Eyes: Negative for redness.  Respiratory: Positive for shortness of breath. Negative  for cough.   Cardiovascular: Negative for chest pain and leg swelling.  Gastrointestinal: Negative for vomiting and abdominal pain.  Genitourinary: Negative for dysuria and flank pain.  Musculoskeletal: Negative for back pain and neck pain.  Skin: Negative for rash.  Neurological: Negative for headaches.  Hematological: Does not bruise/bleed easily.  Psychiatric/Behavioral: Negative for confusion.    Allergies  Review of patient's allergies indicates no known allergies.  Home Medications   Current Outpatient Rx  Name  Route  Sig  Dispense  Refill  . albuterol (PROVENTIL) (2.5 MG/3ML) 0.083% nebulizer solution    Nebulization   Take 2.5 mg by nebulization every 6 (six) hours as needed for wheezing or shortness of breath.          [name] ascorbic acid (VITAMIN C) 500 MG tablet   Oral   Take 500 mg by mouth 2 (two) times daily.         [name] aspirin EC 325 MG tablet   Oral   Take 325 mg by mouth at bedtime.         . budesonide-formoterol (SYMBICORT) 160-4.5 MCG/ACT inhaler   Inhalation   Inhale 2 puffs into the lungs 2 (two) times daily.         . Cholecalciferol 10000 UNITS CAPS   Oral   Take 2 capsules by mouth at bedtime.         . citalopram (CELEXA) 40 MG tablet   Oral   Take 40 mg by mouth at bedtime.         . cyanocobalamin 1000 MCG tablet   Oral   Take 1,000 mcg by mouth at bedtime.          . docusate sodium (COLACE) 100 MG capsule   Oral   Take 200 mg by mouth at bedtime.          . ferrous fumarate-b12-vitamic C-folic acid (TRINSICON / FOLTRIN) capsule   Oral   Take 1 capsule by mouth 2 (two) times daily.         . furosemide (LASIX) 40 MG tablet   Oral   Take 20 mg by mouth as needed (Weight 224 pounds or greater). Take with potassium         . levothyroxine (SYNTHROID, LEVOTHROID) 100 MCG tablet   Oral   Take 50 mcg by mouth at bedtime.          . metoprolol succinate (TOPROL-XL) 25 MG 24 hr tablet   Oral   Take 25 mg by mouth. 1/2 tab am and 1 tab pm         . nitroGLYCERIN (NITROSTAT) 0.4 MG SL tablet   Sublingual   Place 0.4 mg under the tongue every 5 (five) minutes as needed.           . pantoprazole (PROTONIX) 40 MG tablet   Oral   Take 40 mg by mouth at bedtime.         . potassium chloride SA (K-DUR,KLOR-CON) 20 MEQ tablet   Oral   Take 20 mEq by mouth. With furosemide         . simvastatin (ZOCOR) 80 MG tablet   Oral   Take 40 mg by mouth at bedtime.           [name] tiotropium (SPIRIVA) 18 MCG inhalation capsule   Inhalation   Place 18 mcg into inhaler and inhale daily.          [name] warfarin (COUMADIN) 5 MG tablet  Take one tab daily or as needed   90 tablet   3    Temp(Src) 97.4 F (36.3 C) (Oral)  Resp 22  SpO2 98% Physical Exam  Nursing note and vitals reviewed. Constitutional: He is oriented to person, place, and time. He appears well-developed and well-nourished. No distress.  Color normal.   HENT:  Mouth/Throat: Oropharynx is clear and moist.  Eyes: Conjunctivae are normal.  Neck: Neck supple. No tracheal deviation present.  Cardiovascular:  Continuous hum of LVAD present.   Pulmonary/Chest: Effort normal and breath sounds normal. No accessory muscle usage. No respiratory distress.  Abdominal: Soft. He exhibits no distension. There is no tenderness.  LVAD site without sign of infection.   Genitourinary:  No cva tenderness  Musculoskeletal: Normal range of motion. He exhibits no edema.  Neurological: He is alert and oriented to person, place, and time.  Skin: Skin is warm and dry. He is not diaphoretic.  Psychiatric: He has a normal mood and affect.    ED Course  Procedures (including critical care time)  Results for orders placed during the hospital encounter of 09/13/13  CBC      Result Value Range   WBC 9.8  4.0 - 10.5 K/uL   RBC 2.64 (*) 4.22 - 5.81 MIL/uL   Hemoglobin 7.7 (*) 13.0 - 17.0 g/dL   HCT 40.9 (*) 81.1 - 91.4 %   MCV 101.9 (*) 78.0 - 100.0 fL   MCH 29.2  26.0 - 34.0 pg   MCHC 28.6 (*) 30.0 - 36.0 g/dL   RDW 78.2 (*) 95.6 - 21.3 %   Platelets 200  150 - 400 K/uL  COMPREHENSIVE METABOLIC PANEL      Result Value Range   Sodium 138  135 - 145 mEq/L   Potassium 4.3  3.5 - 5.1 mEq/L   Chloride 105  96 - 112 mEq/L   CO2 22  19 - 32 mEq/L   Glucose, Bld 110 (*) 70 - 99 mg/dL   BUN 19  6 - 23 mg/dL   Creatinine, Ser 0.86  0.50 - 1.35 mg/dL   Calcium 9.9  8.4 - 57.8 mg/dL   Total Protein 6.9  6.0 - 8.3 g/dL   Albumin 3.5  3.5 - 5.2 g/dL   AST 20  0 - 37 U/L   ALT 8  0 - 53 U/L   Alkaline Phosphatase 66  39 - 117 U/L   Total Bilirubin 0.7  0.3 - 1.2 mg/dL    GFR calc non Af Amer 89 (*) >90 mL/min   GFR calc Af Amer >90  >90 mL/min  PROTIME-INR      Result Value Range   Prothrombin Time 27.4 (*) 11.6 - 15.2 seconds   INR 2.66 (*) 0.00 - 1.49  TROPONIN I      Result Value Range   Troponin I <0.30  <0.30 ng/mL  LACTATE DEHYDROGENASE      Result Value Range   LDH 288 (*) 94 - 250 U/L  PRO B NATRIURETIC PEPTIDE      Result Value Range   Pro B Natriuretic peptide (BNP) 1465.0 (*) 0 - 125 pg/mL   Dg Chest 2 View  09/13/2013   CLINICAL DATA:  Shortness of breath  EXAM: CHEST  2 VIEW  COMPARISON:  02/03/2013  FINDINGS: Cardiomediastinal silhouette is stable. Status post median sternotomy. Left ventricular assisting device is stable in position. No acute infiltrate or pulmonary edema. Single lead cardiac pacemaker is stable in position. Diaphragmatic  calcification right base is stable.  IMPRESSION: No active disease.  Stable support apparatus.   Electronically Signed   By: Natasha Mead M.D.   On: 09/13/2013 15:10     EKG Interpretation    Date/Time:  Monday September 13 2013 13:35:49 EST Ventricular Rate:  107 PR Interval:    QRS Duration: 88 QT Interval:  370 QTC Calculation: 494 R Axis:   -7 Text Interpretation:  Atrial fibrillation Poor data quality Non-specific ST-t changes No significant change since last tracing Confirmed by Munirah Doerner  MD, Dalanie Kisner ([id number]) on 09/13/2013 1:50:56 PM            MDM  Iv ns. O2. Pulse ox.  Labs. Cxr.  pts LVAD cardiologist paged.  Reviewed nursing notes and prior charts for additional history.   hgb decreased from prior, hx requiring transfusion, will heme test stool, type and screen added to labs - feel likely would benefit from transfusion.   Discussed w cardiology service - they will admit.    Suzi Roots, MD 09/13/13 [phone]

## 2013-09-13 NOTE — ED Notes (Signed)
C/o worsening SOB with minimal exertion since Monday. States now gets SOB getting dressed.  Denies CP, palpitations, fever, cold, cough. No pedal edema seen.  Pt has LVAD in place. Pt A&OX4, skin warm & dry.

## 2013-09-13 NOTE — ED Notes (Signed)
Joan Flores, VAD nurse informed & aware pt here in ED rm 34

## 2013-09-13 NOTE — H&P (Addendum)
ADVANCED HEART FAILURE ADMIT   HPI:  Mr. Simson is a 72 year old gentleman with a history of CAD s/p CABG (2003), chronic systolic HF EF 20-25% s/p ICD, COPD, OSA and PAF. He is a disabled [employer].   Underwent LVAD HM II implant 01/12/13 under DT criteria.  Has been doing very well. Until about 3-5 days ago. Came to ER today c/o dyspnea and fatigue with only minimal activity. He has not had lasix since 11/24 because he was instructed to only take if his weight at home is 224 or greater. Weight at home has been < 220 pounds. Todays weight at home was 217 pounds.. He denies melena or bleeding problems .  Denies CP, orthopnea, PND or edema. No cough, fevers or chills.   VAD interrogated with rare PI events noted.   VAD Flow:  Pump Flow 5.3 Speed: 9200  PI: 5.6  Pump Power 5.6   MAP 83       Review of Systems:     Cardiac Review of Systems: {Y] = yes [ ]  = no  Chest Pain [    ]  Resting SOB [   ] Exertional SOB  [Y  ]  Orthopnea [ Y ]   Pedal Edema [   ]    Palpitations [  ] Syncope  [  ]   Presyncope [   ]  General Review of Systems: [Y] = yes [  ]=no Constitional: recent weight change [Y  ]; anorexia [  ]; fatigue [Y  ]; nausea [  ]; night sweats [  ]; fever [  ]; or chills [  ];                                                                                                                                          Dental: poor dentition[  ]; Last Dentist visit:   Eye : blurred vision [  ]; diplopia [   ]; vision changes [  ];  Amaurosis fugax[  ]; Resp: cough [  ];  wheezing[  ];  hemoptysis[  ]; shortness of breath[ Y ]; paroxysmal nocturnal dyspnea[  ]; dyspnea on exertion[ Y ]; or orthopnea[  ];  GI:  gallstones[  ], vomiting[  ];  dysphagia[  ]; melena[  ];  hematochezia [  ]; heartburn[  ];    GU: kidney stones [  ]; hematuria[  ];   dysuria [  ];  nocturia[  ];  history of     obstruction [  ];                 Skin: rash, swelling[  ];, hair loss[  ];  peripheral edema[   ];  or itching[  ]; Musculosketetal: myalgias[  ];  joint swelling[  ];  joint erythema[  ];  joint pain[ y ];  back pain[  ];  Heme/Lymph: bruising[  ];  bleeding[  ];  anemia[  ];  Neuro: TIA[  ];  headaches[  ];  stroke[  ];  vertigo[  ];  seizures[  ];   paresthesias[  ];  difficulty walking[  ];  Psych:depression[ Y ]; anxiety[  ];  Endocrine: diabetes[  ];  thyroid dysfunction[  ];  Other:  Past Medical History  Diagnosis Date  . Ischemic cardiomyopathy      CABG 2003, PCI 2007  EF 27%(myoview 2012  . CHF (congestive heart failure)     class I-II  . Intestine disorder     blockage  . Hyperlipidemia   . Hypothyroidism   . Chronic anticoagulation   . Obesity   . COPD (chronic obstructive pulmonary disease)   . 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 Atrial fibrillation     paroxysmal; on coumadin  . Paroxysmal ventricular tachycardia     has ICD in place  . Coronary artery disease   . Automatic implantable cardioverter-defibrillator in situ   . Hypertension   . Myocardial infarction 1990's-2000    "2 in  ~ the 1990's; 1 in ~ 2000" (05/24/2013)  . Exertional shortness of breath   . OSA on CPAP   . On home oxygen therapy     "2L q hs" (05/24/2013)  . Depression      (Not in a hospital admission)   No Known Allergies  History   Social History  . Marital Status: Married    Spouse Name: N/A    Number of Children: N/A  . Years of Education: N/A   Occupational History  . Not on file.   Social History Main Topics  . Smoking status: Former Smoker -- 2.00 packs/day for 45 years    Types: Cigarettes    Quit date: 11/11/2001  . Smokeless tobacco: Never Used  . Alcohol Use: Yes     Comment: 05/24/2013 "use to drink beer; hardly nothing since 2003; once in awhile a beer "  . Drug Use: No  . Sexual Activity: No   Other Topics Concern  . Not on file   Social History Narrative  . No narrative on file    Family History  Problem Relation Age  of Onset  . Heart attack Mother   . Heart attack Father     PHYSICAL EXAM: Filed Vitals:   09/13/13 1340  Pulse: 91  Temp:   Resp: 36   General:  Pale No respiratory difficulty. Wife present  HEENT: normal Neck: supple. JVP ~ 9-10 . Carotids 2+ bilat; no bruits. No lymphadenopathy or thryomegaly appreciated. Cor: PMI nondisplaced. Regular rate & rhythm. No rubs, gallops or murmurs. LVAD hum present  Lungs: clear Abdomen: soft, nontender, nondistended. No hepatosplenomegaly. No bruits or masses. Good bowel sounds. Extremities: no cyanosis, clubbing, rash, minimal edema RLE  Neuro: alert & oriented x 3, cranial nerves grossly intact. moves all 4 extremities w/o difficulty. Affect pleasant. Skin: LVAD site no erythema noted.     Results for orders placed during the hospital encounter of 09/13/13 (from the past 24 hour(s))  CBC     Status: Abnormal   Collection Time    09/13/13  1:28 PM      Result Value Range   WBC 9.8  4.0 - 10.5 K/uL   RBC 2.64 (*) 4.22 - 5.81 MIL/uL   Hemoglobin 7.7 (*) 13.0 - 17.0 g/dL   HCT 96.0 (*) 45.4 - 09.8 %  MCV 101.9 (*) 78.0 - 100.0 fL   MCH 29.2  26.0 - 34.0 pg   MCHC 28.6 (*) 30.0 - 36.0 g/dL   RDW 16.1 (*) 09.6 - 04.5 %   Platelets 200  150 - 400 K/uL  COMPREHENSIVE METABOLIC PANEL     Status: Abnormal   Collection Time    09/13/13  1:28 PM      Result Value Range   Sodium 138  135 - 145 mEq/L   Potassium 4.3  3.5 - 5.1 mEq/L   Chloride 105  96 - 112 mEq/L   CO2 22  19 - 32 mEq/L   Glucose, Bld 110 (*) 70 - 99 mg/dL   BUN 19  6 - 23 mg/dL   Creatinine, Ser 4.09  0.50 - 1.35 mg/dL   Calcium 9.9  8.4 - 81.1 mg/dL   Total Protein 6.9  6.0 - 8.3 g/dL   Albumin 3.5  3.5 - 5.2 g/dL   AST 20  0 - 37 U/L   ALT 8  0 - 53 U/L   Alkaline Phosphatase 66  39 - 117 U/L   Total Bilirubin 0.7  0.3 - 1.2 mg/dL   GFR calc non Af Amer 89 (*) >90 mL/min   GFR calc Af Amer >90  >90 mL/min  PROTIME-INR     Status: Abnormal   Collection Time     09/13/13  1:28 PM      Result Value Range   Prothrombin Time 27.4 (*) 11.6 - 15.2 seconds   INR 2.66 (*) 0.00 - 1.49  TROPONIN I     Status: None   Collection Time    09/13/13  1:28 PM      Result Value Range   Troponin I <0.30  <0.30 ng/mL  LACTATE DEHYDROGENASE     Status: Abnormal   Collection Time    09/13/13  2:18 PM      Result Value Range   LDH 288 (*) 94 - 250 U/L  PRO B NATRIURETIC PEPTIDE     Status: Abnormal   Collection Time    09/13/13  2:22 PM      Result Value Range   Pro B Natriuretic peptide (BNP) 1465.0 (*) 0 - 125 pg/mL  OCCULT BLOOD, POC DEVICE     Status: None   Collection Time    09/13/13  5:03 PM      Result Value Range   Fecal Occult Bld NEGATIVE  NEGATIVE   No results found.   ASSESSMENT: 1. A/C Systolic Heart Failure  2. S/P LVAD 12/2012 3.  Chronic Anticoagulation - on chronic coumadin  4. CAD  5. Depression 6. OSA- uses CPAP nightly   7. Anemia   PLAN/DISCUSSION:  Mr Loman is a 72 year old S/P LVAD HM II implant 01/12/13 admitted today with increased dyspnea and mild volume overload noted on exam. VAD interrogated and he has had rare PI events. Will admit and diurese with IV lasix. Continue current dose of Toprol may need to cut back if he doesn't improve.  Drive line ok with no s/s of infection. Check labs.   May need ramp ECHO during this admit.   CLEGG,AMY NP-C 3:12 PM  Patient seen and examined with Tonye Becket, NP. All labs reviewed. VAD interrogated personally. We discussed all aspects of the encounter. I agree with the assessment and plan as stated above.   Suspect symptoms likely a combination of HF and anemia. Will admit for diuresis as tolerated and  give 2u RBCs and see how he does. Will also check repeat echo. Check FOBT and iron studies.  Truman Hayward 5:37 PM

## 2013-09-13 NOTE — ED Notes (Signed)
Verbal order per Dr. Adella Hare that pt may leave department without nurse for chest X ray

## 2013-09-13 NOTE — ED Notes (Signed)
Sob started last week has LVAD has not told anyone

## 2013-09-13 NOTE — ED Notes (Signed)
Pt transported to ICU with ED RN & RRT nurse

## 2013-09-13 NOTE — ED Notes (Signed)
Cardiology MD, Bensimhon at bedside.

## 2013-09-14 LAB — TYPE AND SCREEN
ABO/RH(D): O POS
Antibody Screen: NEGATIVE
Unit division: 0
Unit division: 0

## 2013-09-14 LAB — BASIC METABOLIC PANEL
BUN: 18 mg/dL (ref 6–23)
CO2: 26 mEq/L (ref 19–32)
Calcium: 9.4 mg/dL (ref 8.4–10.5)
Creatinine, Ser: 0.9 mg/dL (ref 0.50–1.35)
GFR calc non Af Amer: 84 mL/min — ABNORMAL LOW (ref >90)
Glucose, Bld: 125 mg/dL — ABNORMAL HIGH (ref 70–99)

## 2013-09-14 LAB — CBC
MCH: 29.9 pg (ref 26.0–34.0)
MCHC: 30.7 g/dL (ref 30.0–36.0)
MCV: 97.3 fL (ref 78.0–100.0)
Platelets: 182 10*3/uL (ref 150–400)
RDW: 19.5 % — ABNORMAL HIGH (ref 11.5–15.5)

## 2013-09-14 LAB — IRON AND TIBC
Iron: 265 ug/dL — ABNORMAL HIGH (ref 42–135)
Saturation Ratios: 68 % — ABNORMAL HIGH (ref 20–55)

## 2013-09-14 MED ORDER — FUROSEMIDE 10 MG/ML IJ SOLN
40.0000 mg | Freq: Once | INTRAMUSCULAR | Status: AC
  Administered 2013-09-14: 40 mg via INTRAVENOUS
  Filled 2013-09-14: qty 4

## 2013-09-14 MED ORDER — FUROSEMIDE 40 MG PO TABS: 40.0000 mg | ORAL_TABLET | ORAL | Status: DC

## 2013-09-14 MED ORDER — POTASSIUM CHLORIDE CRYS ER 20 MEQ PO TBCR
40.0000 meq | EXTENDED_RELEASE_TABLET | Freq: Once | ORAL | Status: AC
  Administered 2013-09-14: 40 meq via ORAL
  Filled 2013-09-14 (×2): qty 2

## 2013-09-14 MED ORDER — POTASSIUM CHLORIDE CRYS ER 20 MEQ PO TBCR: 20.0000 meq | EXTENDED_RELEASE_TABLET | ORAL | Status: DC

## 2013-09-14 NOTE — Progress Notes (Signed)
Advanced Heart Failure Rounding Note   Subjective:     Admitted yesterday for progressive dyspnea thought to be due to HF and anemia. Rec'd 2 units of PRBCs. Diuresed with IV lasix.   Down 3 liters. Weight down 9 pounds (?). Feels much better. MAPs 80s. Renal function stable.   Hgb up to 9.0  K 3.4 Vad parameters stable.   Speed 9200  Power 6.0 PI 4.7 Flow 4.8   Objective:   Weight Range:  Vital Signs:   Temp:  [97.4 F (36.3 C)-98.4 F (36.9 C)] 98.2 F (36.8 C) (12/16 0335) Pulse Rate:  [82-117] 84 (12/16 0400) Resp:  [19-39] 20 (12/16 0400) SpO2:  [96 %-100 %] 97 % (12/16 0400) Weight:  [92.3 kg (203 lb 7.8 oz)-96.6 kg (212 lb 15.4 oz)] 92.3 kg (203 lb 7.8 oz) (12/16 0400) Last BM Date: 09/13/13  Weight change: Filed Weights   09/13/13 1828 09/14/13 0400  Weight: 96.6 kg (212 lb 15.4 oz) 92.3 kg (203 lb 7.8 oz)    Intake/Output:   Intake/Output Summary (Last 24 hours) at 09/14/13 0703 Last data filed at 09/14/13 1610  Gross per 24 hour  Intake    625 ml  Output   3450 ml  Net  -2825 ml     Physical Exam: General: Comfortable No respiratory difficulty. Wife present  HEENT: normal  Neck: supple. JVP ~ 6-7 . Carotids 2+ bilat; no bruits. No lymphadenopathy or thryomegaly appreciated.  Cor: PMI nondisplaced. Irregular rate & rhythm. No rubs, gallops or murmurs. LVAD hum present  Lungs: clear  Abdomen: soft, nontender, nondistended. No hepatosplenomegaly. No bruits or masses. Good bowel sounds.  Extremities: no cyanosis, clubbing, rash, minimal edema RLE  Neuro: alert & oriented x 3, cranial nerves grossly intact. moves all 4 extremities w/o difficulty. Affect pleasant.  Skin: LVAD site no erythema noted.      Telemetry:  AF 80-90s  Labs: Basic Metabolic Panel:  Recent Labs Lab 09/13/13 1328 09/14/13 0330  NA 138 136  K 4.3 3.4*  CL 105 100  CO2 22 26  GLUCOSE 110* 125*  BUN 19 18  CREATININE 0.77 0.90  CALCIUM 9.9 9.4    Liver Function  Tests:  Recent Labs Lab 09/13/13 1328  AST 20  ALT 8  ALKPHOS 66  BILITOT 0.7  PROT 6.9  ALBUMIN 3.5   No results found for this basename: LIPASE, AMYLASE,  in the last 168 hours No results found for this basename: AMMONIA,  in the last 168 hours  CBC:  Recent Labs Lab 09/13/13 1328 09/14/13 0330  WBC 9.8 10.4  HGB 7.7* 9.0*  HCT 26.9* 29.3*  MCV 101.9* 97.3  PLT 200 182    Cardiac Enzymes:  Recent Labs Lab 09/13/13 1328  TROPONINI <0.30    BNP: BNP (last 3 results)  Recent Labs  01/13/13 0350 01/19/13 0515 09/13/13 1422  PROBNP 1080.0* 2665.0* 1465.0*     Other results:    Imaging: Dg Chest 2 View  09/13/2013   CLINICAL DATA:  Shortness of breath  EXAM: CHEST  2 VIEW  COMPARISON:  02/03/2013  FINDINGS: Cardiomediastinal silhouette is stable. Status post median sternotomy. Left ventricular assisting device is stable in position. No acute infiltrate or pulmonary edema. Single lead cardiac pacemaker is stable in position. Diaphragmatic calcification right base is stable.  IMPRESSION: No active disease.  Stable support apparatus.   Electronically Signed   By: Natasha Mead M.D.   On: 09/13/2013 15:10  Medications:     Scheduled Medications: . aspirin EC  325 mg Oral QHS  . atorvastatin  40 mg Oral q1800  . budesonide-formoterol  2 puff Inhalation BID  . citalopram  40 mg Oral QHS  . docusate sodium  200 mg Oral QHS  . ferrous fumarate-b12-vitamic C-folic acid  1 capsule Oral BID  . levothyroxine  50 mcg Oral QHS  . metoprolol succinate  12.5 mg Oral q morning - 10a  . metoprolol succinate  25 mg Oral QPM  . pantoprazole  40 mg Oral QHS  . potassium chloride SA  20 mEq Oral Daily  . sodium chloride  3 mL Intravenous Q12H  . tiotropium  18 mcg Inhalation Daily  . cyanocobalamin  1,000 mcg Oral QHS  . warfarin  5 mg Oral q1800  . Warfarin - Pharmacist Dosing Inpatient   Does not apply q1800     Infusions:     PRN Medications:  sodium  chloride, albuterol, sodium chloride   Assessment:   1. A/C Systolic Heart Failure  2. S/P LVAD 12/2012  3. Chronic AF - on chronic coumadin  4. CAD  5. Depression  6. OSA- uses CPAP nightly  7. Anemia 8. Hypokalemia   Plan/Discussion:    He is much improved. Volume status back to baseline. Will give one more dose IV lasix this am as tolerated. Can likely be discharged this afternoon. Would probably give him home diuretics 2x/week (Monday and Friday) to protect new dry weight. Await FOBT and iron studies. Supp K+  Truman Hayward 7:09 AM Advanced Heart Failure Team Pager [phone] (M-F; 7a - 4p)  Please contact [facility] for night-coverage after hours (4p -7a ) and weekends on [email or web address]

## 2013-09-14 NOTE — Discharge Summary (Signed)
Advanced Heart Failure Team  Discharge Summary   Patient ID: [name] MRN: [id number], DOB/AGE: [birth year] 72 y.o. Admit date: 09/13/2013 D/C date:     09/14/2013   Primary Discharge Diagnoses:  1. A/C Systolic Heart Failure  2. S/P LVAD 12/2012  3. Chronic Anticoagulation - on chronic coumadin  4. CAD  5. Depression  6. OSA- uses CPAP nightly  7. Anemia - hemoglobin on admit was 7.7 received 2UPRBCs and improved to 9.0    Hospital Course:   [name] is a 72 year old gentleman with a history of CAD s/p CABG (2003), chronic systolic HF EF 20-25% s/p ICD, COPD, OSA, PAF, and S/P LVAD HM II implant 01/12/13 under DT criteria.  He presented to [facility] ED with increased dyspnea and fatigue. Prior to admit he had not required any lasix in over 1 month because he only takes 20 mg lasix if his weight is 224 pounds or greater. LVAD was interrogated and he had rare PI events noted. Admitting labs revealed hemoglobin of 7.7 without any source of bleeding. He received 2UPRBC with appropriate rise in hemoglobin.  On exam he had mild volume overload despite being well under his dry weight. His BNP was significantly elevated. He diuresed with IV lasix and later transitioned to lasix 40 mg po twice a week. Overall he diuresed 9 pounds.  Discharge weight was 203 pounds. With marked symptomatic improvement. Dry weight adjusted.   He will continue to be followed closely in the HF clinic with follow up scheduled 09/21/13 at 10:00.   Discharge Weight Range: Discharge weight 203 pounds.  Discharge Vitals: Blood pressure , pulse 78, temperature 98.2 F (36.8 C), temperature source Oral, resp. rate 20, height 6' (1.829 m), weight 203 lb 7.8 oz (92.3 kg), SpO2 94.00%.  Labs: Lab Results  Component Value Date   WBC 10.4 09/14/2013   HGB 9.0* 09/14/2013   HCT 29.3* 09/14/2013   MCV 97.3 09/14/2013   PLT 182 09/14/2013     Recent Labs Lab 09/13/13 1328 09/14/13 0330  NA 138 136  K 4.3 3.4*  CL  105 100  CO2 22 26  BUN 19 18  CREATININE 0.77 0.90  CALCIUM 9.9 9.4  PROT 6.9  --   BILITOT 0.7  --   ALKPHOS 66  --   ALT 8  --   AST 20  --   GLUCOSE 110* 125*   Lab Results  Component Value Date   CHOL 126 07/02/2012   HDL 40.80 07/02/2012   LDLCALC 58 07/02/2012   TRIG 134.0 07/02/2012   BNP (last 3 results)  Recent Labs  01/13/13 0350 01/19/13 0515 09/13/13 1422  PROBNP 1080.0* 2665.0* 1465.0*    Diagnostic Studies/Procedures   Dg Chest 2 View  09/13/2013   CLINICAL DATA:  Shortness of breath  EXAM: CHEST  2 VIEW  COMPARISON:  02/03/2013  FINDINGS: Cardiomediastinal silhouette is stable. Status post median sternotomy. Left ventricular assisting device is stable in position. No acute infiltrate or pulmonary edema. Single lead cardiac pacemaker is stable in position. Diaphragmatic calcification right base is stable.  IMPRESSION: No active disease.  Stable support apparatus.   Electronically Signed   By: Natasha Mead M.D.   On: 09/13/2013 15:10    Discharge Medications     Medication List    STOP taking these medications       ascorbic acid 500 MG tablet  Commonly known as:  VITAMIN C     Cholecalciferol 10000 UNITS Caps  TAKE these medications       albuterol (2.5 MG/3ML) 0.083% nebulizer solution  Commonly known as:  PROVENTIL  Take 2.5 mg by nebulization every 6 (six) hours as needed for wheezing or shortness of breath.     aspirin EC 325 MG tablet  Take 325 mg by mouth at bedtime.     budesonide-formoterol 160-4.5 MCG/ACT inhaler  Commonly known as:  SYMBICORT  Inhale 2 puffs into the lungs 2 (two) times daily.     citalopram 40 MG tablet  Commonly known as:  CELEXA  Take 40 mg by mouth at bedtime.     cyanocobalamin 1000 MCG tablet  Take 1,000 mcg by mouth at bedtime.     docusate sodium 100 MG capsule  Commonly known as:  COLACE  Take 200 mg by mouth at bedtime.     ferrous fumarate-b12-vitamic C-folic acid capsule  Commonly known as:   TRINSICON / FOLTRIN  Take 1 capsule by mouth 2 (two) times daily.     furosemide 40 MG tablet  Commonly known as:  LASIX  Take 1 tablet (40 mg total) by mouth 2 (two) times a week. Take 40 mg every Monday and Friday. Take with potassium     levothyroxine 100 MCG tablet  Commonly known as:  SYNTHROID, LEVOTHROID  Take 50 mcg by mouth at bedtime.     metoprolol succinate 25 MG 24 hr tablet  Commonly known as:  TOPROL-XL  Take 12.5-25 mg by mouth 2 (two) times daily. 1/2 tab am and 1 tab pm     nitroGLYCERIN 0.4 MG SL tablet  Commonly known as:  NITROSTAT  Place 0.4 mg under the tongue every 5 (five) minutes as needed.     pantoprazole 40 MG tablet  Commonly known as:  PROTONIX  Take 40 mg by mouth at bedtime.     potassium chloride SA 20 MEQ tablet  Commonly known as:  K-DUR,KLOR-CON  Take 1 tablet (20 mEq total) by mouth 2 (two) times a week. Take 20 meq every Monday and Friday with furosemide     simvastatin 80 MG tablet  Commonly known as:  ZOCOR  Take 40 mg by mouth at bedtime.     tiotropium 18 MCG inhalation capsule  Commonly known as:  SPIRIVA  Place 18 mcg into inhaler and inhale daily.     warfarin 5 MG tablet  Commonly known as:  COUMADIN  Take 5 mg by mouth daily.        Disposition   The patient will be discharged in stable condition to home. Discharge Orders   Future Appointments Provider Department Dept Phone   09/15/2013 10:10 AM [facility] [place] [facility] [phone]   09/21/2013 10:00 AM [facility] [phone]   10/01/2013 8:00 AM [facility] [employer] [phone]   Future Orders Complete By Expires   Contraindication to ACEI at discharge  As directed    Scheduling Instructions:     hypotension   Diet - low sodium heart healthy  As directed    Heart Failure patients record your daily weight using the same scale at the same time  of day  As directed    Increase activity slowly  As directed      Follow-up Information   Follow up with Arvilla Meres, MD On 09/21/2013. (at 10:00 Gar[facility] [phone])    Specialty:  Cardiology   Contact information:   [address] [place] [address] [phone]         Duration of Discharge Encounter: Greater than 35 minutes   Signed, CLEGG,AMY NP-C 09/14/2013, 10:08 AM  Patient seen and examined with Tonye Becket, NP. We discussed all aspects of the encounter. I agree with the assessment and plan as stated above.   Much improved with diuresis and transfusion. Will resume scheduled lasix 2x/week. Follow BMET and CBC. Will see in clinic next week. Of for d/c today.   Truman Hayward 2:48 PM

## 2013-09-14 NOTE — Progress Notes (Signed)
ANTICOAGULATION CONSULT NOTE - Follow Up Consult  Pharmacy Consult for Coumadin Indication: LVAD  No Known Allergies  Patient Measurements: Height: 6' (182.9 cm) Weight: 203 lb 7.8 oz (92.3 kg) (did not wt with moni/tele) IBW/kg (Calculated) : 77.6 Heparin Dosing Weight: n/a  Vital Signs: Temp: 98.2 F (36.8 C) (12/16 0335) Temp src: Oral (12/16 0335) Pulse Rate: 78 (12/16 0800)  Labs:  Recent Labs  09/13/13 1328 09/14/13 0330  HGB 7.7* 9.0*  HCT 26.9* 29.3*  PLT 200 182  LABPROT 27.4* 22.2*  INR 2.66* 2.02*  CREATININE 0.77 0.90  TROPONINI <0.30  --     Estimated Creatinine Clearance: 82.6 ml/min (by C-G formula based on Cr of 0.9).   Medications:  Scheduled:  . aspirin EC  325 mg Oral QHS  . atorvastatin  40 mg Oral q1800  . budesonide-formoterol  2 puff Inhalation BID  . citalopram  40 mg Oral QHS  . docusate sodium  200 mg Oral QHS  . ferrous fumarate-b12-vitamic C-folic acid  1 capsule Oral BID  . levothyroxine  50 mcg Oral QHS  . metoprolol succinate  12.5 mg Oral q morning - 10a  . metoprolol succinate  25 mg Oral QPM  . pantoprazole  40 mg Oral QHS  . potassium chloride SA  20 mEq Oral Daily  . sodium chloride  3 mL Intravenous Q12H  . tiotropium  18 mcg Inhalation Daily  . cyanocobalamin  1,000 mcg Oral QHS  . warfarin  5 mg Oral q1800  . Warfarin - Pharmacist Dosing Inpatient   Does not apply q1800    Assessment: CC: has not felt well last week, weak, SOB with minimal exertion  PMH: CABG 2003, HF EF 20% s/p LVAD 4/14, ICD 2011, A-FIB  AC: warf 5mg  daily all outpt INR last 2 months > 2 - admit INR 2.6.  12/16 Today's INR 2.02 - Coumadin dose not given last night even though it was ordered.  Goal of Therapy:  INR 2-3 Monitor platelets by anticoagulation protocol: Yes   Plan:  1. Resume Coumadin at home dose of 5 mg today at discharge.  Tad Moore, BCPS  Clinical Pharmacist Pager [phone]  09/14/2013 12:52 PM

## 2013-09-14 NOTE — Progress Notes (Signed)
Pt discharged per MD order and protocol. Discharge instructions reviewed with patient and wife, all questions answered. Pt aware of all follow up appointments.

## 2013-09-14 NOTE — Progress Notes (Signed)
CARDIAC REHAB PHASE I   PRE:  Rate/Rhythm: 76 afib    BP: sitting 84    SaO2:   MODE:  Ambulation: 350 ft   POST:  Rate/Rhythm: 85 afib    BP: sitting 80     SaO2:   Pt fairly steady. Some SOB walking but sts it is much improved from admit. Tired after 350 ft. Pt does not exercise at home. Left pt on batteries on EOB. Encouraged more ambulation at home.  [id number]   Elissa Lovett [place] CES, ACSM 09/14/2013 11:37 AM

## 2013-09-15 ENCOUNTER — Other Ambulatory Visit: Payer: Medicare Other

## 2013-09-17 ENCOUNTER — Other Ambulatory Visit (HOSPITAL_COMMUNITY): Payer: Self-pay | Admitting: *Deleted

## 2013-09-17 ENCOUNTER — Encounter (HOSPITAL_COMMUNITY): Payer: Self-pay | Admitting: *Deleted

## 2013-09-17 DIAGNOSIS — Z95811 Presence of heart assist device: Secondary | ICD-10-CM

## 2013-09-17 DIAGNOSIS — Z7901 Long term (current) use of anticoagulants: Secondary | ICD-10-CM

## 2013-09-21 ENCOUNTER — Ambulatory Visit (HOSPITAL_COMMUNITY): Payer: Self-pay | Admitting: *Deleted

## 2013-09-21 ENCOUNTER — Ambulatory Visit (HOSPITAL_COMMUNITY)
Admit: 2013-09-21 | Discharge: 2013-09-21 | Disposition: A | Discharge status: Home / Self Care | Payer: Medicare Other | Attending: Internal Medicine | Admitting: Internal Medicine

## 2013-09-21 VITALS — BP 84/0 | HR 87 | Ht 73.0 in | Wt 226.4 lb

## 2013-09-21 DIAGNOSIS — I5022 Chronic systolic (congestive) heart failure: Secondary | ICD-10-CM | POA: Insufficient documentation

## 2013-09-21 DIAGNOSIS — Z7901 Long term (current) use of anticoagulants: Secondary | ICD-10-CM

## 2013-09-21 DIAGNOSIS — D649 Anemia, unspecified: Secondary | ICD-10-CM | POA: Insufficient documentation

## 2013-09-21 DIAGNOSIS — I2510 Atherosclerotic heart disease of native coronary artery without angina pectoris: Secondary | ICD-10-CM | POA: Insufficient documentation

## 2013-09-21 DIAGNOSIS — Z95818 Presence of other cardiac implants and grafts: Secondary | ICD-10-CM

## 2013-09-21 DIAGNOSIS — Z95811 Presence of heart assist device: Secondary | ICD-10-CM

## 2013-09-21 LAB — CBC
HCT: 34.8 % — ABNORMAL LOW (ref 39.0–52.0)
MCHC: 28.7 g/dL — ABNORMAL LOW (ref 30.0–36.0)
MCV: 105.1 fL — ABNORMAL HIGH (ref 78.0–100.0)
Platelets: 168 10*3/uL (ref 150–400)
RBC: 3.31 MIL/uL — ABNORMAL LOW (ref 4.22–5.81)
RDW: 19.8 % — ABNORMAL HIGH (ref 11.5–15.5)
WBC: 10.3 10*3/uL (ref 4.0–10.5)

## 2013-09-21 LAB — PROTIME-INR: INR: 2.04 — ABNORMAL HIGH (ref 0.00–1.49)

## 2013-09-21 LAB — BASIC METABOLIC PANEL
BUN: 14 mg/dL (ref 6–23)
CO2: 26 mEq/L (ref 19–32)
Chloride: 105 mEq/L (ref 96–112)
Creatinine, Ser: 0.88 mg/dL (ref 0.50–1.35)
GFR calc Af Amer: >90 mL/min (ref >90)
Glucose, Bld: 122 mg/dL — ABNORMAL HIGH (ref 70–99)
Potassium: 4.7 mEq/L (ref 3.5–5.1)

## 2013-09-21 NOTE — Progress Notes (Signed)
Symptom  Yes  No  Details   Angina         x Activity:   Claudication         x How far:   Syncope         x When:   Stroke         x   Orthopnea         x How many pillows:   2 for comfort  PND         x How often:  CPAP        x  How many hrs:  all night  Pedal edema         x   Abd fullness         x   N&V         x   Diaphoresis         x When:  Bleeding        x   Urine color    medium yellow  SOB        x  Activity:  incline  Palpitations         x When:  ICD shock         x   Hospitlizaitons        x        When/where/why:  12/15 - 09/14/13 fatigue/dyspnea; anemia; 2 units PC's; IV lasix  ED visit         x When/where/why:  Other MD    When/who/why:  Activity    No formal activity; sedentary  Fluid    No limitations  Diet    No limitations   Vital signs: HR:  87 MAP BP:   84 O2 Sat: 96 Wt:  226.4 Last wt: (clinic) 227;  (hospital) 203 Ht: 6'1"  LVAD interrogation reveals:  Speed: 9200 Flow:  4.6 Power:  5.2 PI:  6.0 Alarms:  none Events:  Rare PI Fixed speed:  9200 Low speed limit: 8600  LVAD exit site:  Well healed and incorporated. The velour is fully implanted at exit site. Dressing dry and intact. No erythema or drainage. Stabilization device present and accurately applied. Driveline dressing is being changed weekly per sterile technique using Sorbaview dressing with biopatch on exit site. Pt denies fever or chills. Pt/caregiver state they have adequate dressing supplies at home.   Pt/caregiver deny any alarms or VAD equipment issues. VAD coordinator reviewed daily log from home for daily temperature, weight, and VAD parameters. Pt is performing daily controller and system monitor self tests along with completing weekly and monthly maintenance for LVAD equipment.   LVAD equipment check completed and is in good working order. Back-up equipment present. LVAD education done on emergency procedures and precautions and reviewed exit site care.

## 2013-09-29 NOTE — Progress Notes (Signed)
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 Quit smoking 2003. Uses CPAP and O2 every night.   He is s/p LVAD HM II implanted 01/12/13 under DT criteria  Follow up: Clinic visit today following recent hospitalization. Patient admitted to [facility] 09/13/13 - 09/14/13 for increased fatigue and dyspnea. Prior to admit he had not required any lasix over 1 month period of time; he had been taking Lasix 20 mg PRN weight 224 pounds or greater. Weight was very stable. On admit found to have mild CHF and anemia.  He was diuresed with IV lasix and transitioned to PO lasix 40 mg twice a week. Overall he diuresed 9 lbs with discharge weight 203 lbs on inpatient scale.  Admitting labs revealed Hgb 7.7 for which he received 2 units PCs with appropriate rise in Hgb.   Presents to clinic today with weight 226.4 lbs (typically 227-229 in clinic); denies any CHF symptoms. In reviewing his daily log, his hospital discharge weight (on home scales) was 211 lbs; log reveals gradual weight gain of 6 lbs to home weight of 217 lbs today.   Denies LVAD alarms. Denies driveline trauma, erythema or drainage. Pt is showering; wife is performing weekly dressing changes using sorbaview dressing. Denies ICD shocks. Reports taking Coumadin as prescribed and adherence to anticoagulation based dietary restrictions. Denies bright red blood per rectum or melena, no dark urine or hematuria  Past Medical History  Diagnosis Date  . Ischemic cardiomyopathy      CABG 2003, PCI 2007  EF 27%(myoview 2012  . CHF (congestive heart failure)     class I-II  . Intestine disorder     blockage  . Hyperlipidemia   . Hypothyroidism   . Chronic anticoagulation   . Obesity   . COPD (chronic obstructive pulmonary disease)   . 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 Atrial fibrillation    paroxysmal; on coumadin  . Paroxysmal ventricular tachycardia     has ICD in place  . Coronary artery disease   . Automatic implantable cardioverter-defibrillator in situ   . Hypertension   . Myocardial infarction 1990's-2000    "2 in  ~ the 1990's; 1 in ~ 2000" (05/24/2013)  . Exertional shortness of breath   . OSA on CPAP   . On home oxygen therapy     "2L q hs" (05/24/2013)  . Depression     Current Outpatient Prescriptions  Medication Sig Dispense Refill  . albuterol (PROVENTIL) (2.5 MG/3ML) 0.083% nebulizer solution Take 2.5 mg by nebulization every 6 (six) hours as needed for wheezing or shortness of breath.       [name] aspirin EC 325 MG tablet Take 325 mg by mouth at bedtime.      . budesonide-formoterol (SYMBICORT) 160-4.5 MCG/ACT inhaler Inhale 2 puffs into the lungs 2 (two) times daily.      . cholecalciferol (VITAMIN D) 1000 UNITS tablet Take 1,000 Units by mouth daily. Take 2 capsules hs      . citalopram (CELEXA) 40 MG tablet Take 40 mg by mouth at bedtime.      . cyanocobalamin 1000 MCG tablet Take 1,000 mcg by mouth at bedtime.       . docusate sodium (COLACE) 100 MG capsule Take 200 mg by mouth at bedtime.       . ferrous fumarate-b12-vitamic C-folic  acid (TRINSICON / FOLTRIN) capsule Take 1 capsule by mouth 2 (two) times daily.      . furosemide (LASIX) 40 MG tablet Take 40 mg by mouth 3 (three) times a week. Take 40 mg every Monday, Wednesday, and Friday. Take with potassium. Take extra dose if weight above 219 lbs      . levothyroxine (SYNTHROID, LEVOTHROID) 100 MCG tablet Take 50 mcg by mouth at bedtime.       . metoprolol succinate (TOPROL-XL) 25 MG 24 hr tablet Take 12.5-25 mg by mouth 2 (two) times daily. 1/2 tab am and 1 tab pm      . pantoprazole (PROTONIX) 40 MG tablet Take 40 mg by mouth at bedtime.      . potassium chloride SA (K-DUR,KLOR-CON) 20 MEQ tablet Take 20 mEq by mouth 3 (three) times a week. Take 20 meq every Monday, Wednesday, and Friday with Lasix Take  extra K-Dur with any extra Lasix doses      . simvastatin (ZOCOR) 80 MG tablet Take 40 mg by mouth at bedtime.        [name] tiotropium (SPIRIVA) 18 MCG inhalation capsule Place 18 mcg into inhaler and inhale daily.       . vitamin C (ASCORBIC ACID) 500 MG tablet Take 500 mg by mouth 2 (two) times daily.       [name] warfarin (COUMADIN) 5 MG tablet Take 5 mg by mouth daily.      . nitroGLYCERIN (NITROSTAT) 0.4 MG SL tablet Place 0.4 mg under the tongue every 5 (five) minutes as needed.         No current facility-administered medications for this encounter.    Review of patient's allergies indicates no known allergies.  REVIEW OF SYSTEMS: All systems negative except as listed in HPI, PMH and Problem list.   LVAD interrogation reveals:  Speed: 9200 Flow:  4.6 Power:  5.2 PI:  6.0 Alarms:  none Events:  Rare PI Fixed speed:  9200 Low speed limit: 8600   I reviewed the LVAD parameters from today, and compared the results to the patient's prior recorded data.  No programming changes were made.  The LVAD is functioning within specified parameters.  The patient performs LVAD self-test daily.  LVAD interrogation was negative for any significant power changes or alarms. There were a few PI events daily, but nothing concerning and none back to back. LVAD equipment check completed and is in good working order.  Back-up equipment present.   LVAD education done on emergency procedures and precautions and reviewed exit site care.  Physical Exam:  Vital signs: HR:  87 MAP BP:   84 O2 Sat: 96 Wt:  226.4 Last wt: (clinic) 227;  (hospital) 203 Ht: 6'1"  GENERAL: Well appearing, male; NAD; wife present HEENT: normal  NECK: Supple, JVP 8-9. 2+ bilaterally, no bruits.  No lymphadenopathy or thyromegaly appreciated.   CARDIAC:  Mechanical heart sounds with LVAD hum present.  LUNGS:  Clear to auscultation bilaterally.  ABDOMEN:  Soft, round, nontender, positive bowel sounds x4.     LVAD exit site:  well-healed and incorporated.  Dressing dry and intact.  No erythema, drainage, odor or tenderness.  Stabilization device present and accurately applied.  Driveline dressing is being changed daily per sterile technique. Changed in clinic EXTREMITIES:  Warm and dry, no cyanosis, clubbing, rash or 2+ edema  NEUROLOGIC:  Alert and oriented x 4.  Gait steady.  No aphasia.  No dysarthria.  Affect pleasant.  ASSESSMENT AND PLAN:   1) Chronic Systolic HF, s/p LVAD implant 10/6107 - Patient is currently having NYHA II/III symptoms.  - His fluid status is mildly elevated will increase lasix to 40 mg three times week  Monday, Wed, and Fri along with taking his Potassium 20 meq on the days he takes his diuretic. Instructed to take extra Lasix 40 mg if weight greater than 218 lbs. Reinforced need for daily weights and reviewed use of sliding scale diuretics. - Suspect pulmonary disease also playing a role - MAP 84 - will continue current regimen. - BMET normal  2)  Anticoagulation management - INR goal 2.0-3.0. Patient has PAF.  - On Coumadin and ASA 325 mg. - INR 2.04 no change in coumadin dosing; re-check in one month  3) Anemia - Hgb 10.0 - will check Hbg monthly   4) CAD - stable denies any CP - Continue BB, ACE-I and ASA  5) LVAD- - Doing great and no alarms. Parameters within normal range. LDH today 311 - Follow up 6 weeks.  Arvilla Meres MD 11:47 AM

## 2013-10-01 ENCOUNTER — Ambulatory Visit (INDEPENDENT_AMBULATORY_CARE_PROVIDER_SITE_OTHER): Payer: Medicare Other | Admitting: *Deleted

## 2013-10-01 DIAGNOSIS — I4891 Unspecified atrial fibrillation: Secondary | ICD-10-CM

## 2013-10-01 DIAGNOSIS — I4729 Other ventricular tachycardia: Secondary | ICD-10-CM

## 2013-10-01 DIAGNOSIS — I472 Ventricular tachycardia, unspecified: Secondary | ICD-10-CM

## 2013-10-01 DIAGNOSIS — I255 Ischemic cardiomyopathy: Secondary | ICD-10-CM

## 2013-10-01 DIAGNOSIS — I509 Heart failure, unspecified: Secondary | ICD-10-CM

## 2013-10-01 DIAGNOSIS — I2589 Other forms of chronic ischemic heart disease: Secondary | ICD-10-CM

## 2013-10-01 LAB — MDC_IDC_ENUM_SESS_TYPE_REMOTE
Date Time Interrogation Session: 20150102062508
HIGH POWER IMPEDANCE MEASURED VALUE: 171 Ohm
HighPow Impedance: 285 Ohm
HighPow Impedance: 37 Ohm
HighPow Impedance: 43 Ohm
Lead Channel Sensing Intrinsic Amplitude: 6 mV
Lead Channel Setting Pacing Amplitude: 2.5 V
Lead Channel Setting Pacing Pulse Width: 0.4 ms
MDC IDC MSMT BATTERY VOLTAGE: 3.07 V
MDC IDC MSMT LEADCHNL RV IMPEDANCE VALUE: 361 Ohm
MDC IDC SET LEADCHNL RV SENSING SENSITIVITY: 0.3 mV
MDC IDC SET ZONE DETECTION INTERVAL: 310 ms
MDC IDC SET ZONE DETECTION INTERVAL: 410 ms
MDC IDC STAT BRADY RV PERCENT PACED: 0.03 %
Zone Setting Detection Interval: 250 ms

## 2013-10-14 ENCOUNTER — Encounter: Payer: Self-pay | Admitting: *Deleted

## 2013-10-19 ENCOUNTER — Other Ambulatory Visit (INDEPENDENT_AMBULATORY_CARE_PROVIDER_SITE_OTHER): Payer: Medicare Other

## 2013-10-19 ENCOUNTER — Ambulatory Visit (HOSPITAL_COMMUNITY): Payer: Self-pay | Admitting: *Deleted

## 2013-10-19 ENCOUNTER — Telehealth (HOSPITAL_COMMUNITY): Payer: Self-pay | Admitting: *Deleted

## 2013-10-19 DIAGNOSIS — I5022 Chronic systolic (congestive) heart failure: Secondary | ICD-10-CM

## 2013-10-19 DIAGNOSIS — Z7901 Long term (current) use of anticoagulants: Secondary | ICD-10-CM

## 2013-10-19 DIAGNOSIS — D649 Anemia, unspecified: Secondary | ICD-10-CM

## 2013-10-19 DIAGNOSIS — Z95811 Presence of heart assist device: Secondary | ICD-10-CM

## 2013-10-19 LAB — PROTIME-INR
INR: 2.4 ratio — AB (ref 0.8–1.0)
PROTHROMBIN TIME: 25.4 s — AB (ref 10.2–12.4)

## 2013-10-19 NOTE — Telephone Encounter (Signed)
Pt was suppose to have H & H today with INR but it was not drawn, new order placed and appt sch

## 2013-10-20 ENCOUNTER — Ambulatory Visit (INDEPENDENT_AMBULATORY_CARE_PROVIDER_SITE_OTHER): Payer: Medicare Other | Admitting: *Deleted

## 2013-10-20 DIAGNOSIS — Z95811 Presence of heart assist device: Secondary | ICD-10-CM

## 2013-10-20 DIAGNOSIS — I2510 Atherosclerotic heart disease of native coronary artery without angina pectoris: Secondary | ICD-10-CM

## 2013-10-20 DIAGNOSIS — Z7901 Long term (current) use of anticoagulants: Secondary | ICD-10-CM

## 2013-10-20 DIAGNOSIS — I509 Heart failure, unspecified: Secondary | ICD-10-CM

## 2013-10-20 LAB — HEMOGLOBIN AND HEMATOCRIT, BLOOD
HCT: 28.1 % — ABNORMAL LOW (ref 39.0–52.0)
Hemoglobin: 8.7 g/dL — ABNORMAL LOW (ref 13.0–17.0)

## 2013-10-26 ENCOUNTER — Telehealth: Payer: Self-pay | Admitting: *Deleted

## 2013-10-26 ENCOUNTER — Ambulatory Visit (HOSPITAL_COMMUNITY)
Admission: RE | Admit: 2013-10-26 | Discharge: 2013-10-26 | Disposition: A | Discharge status: Home / Self Care | Payer: Medicare Other | Source: Ambulatory Visit | Attending: Internal Medicine | Admitting: Internal Medicine

## 2013-10-26 DIAGNOSIS — D509 Iron deficiency anemia, unspecified: Secondary | ICD-10-CM | POA: Insufficient documentation

## 2013-10-26 LAB — TYPE AND SCREEN
ABO/RH(D): O POS
Antibody Screen: NEGATIVE

## 2013-10-26 LAB — CBC
HEMATOCRIT: 33.8 % — AB (ref 39.0–52.0)
Hemoglobin: 9.9 g/dL — ABNORMAL LOW (ref 13.0–17.0)
MCH: 30.7 pg (ref 26.0–34.0)
MCHC: 29.3 g/dL — ABNORMAL LOW (ref 30.0–36.0)
MCV: 104.6 fL — AB (ref 78.0–100.0)
Platelets: 154 10*3/uL (ref 150–400)
RBC: 3.23 MIL/uL — ABNORMAL LOW (ref 4.22–5.81)
RDW: 18.8 % — AB (ref 11.5–15.5)
WBC: 8.7 10*3/uL (ref 4.0–10.5)

## 2013-10-26 MED ORDER — ASPIRIN EC 81 MG PO TBEC: 81.0000 mg | DELAYED_RELEASE_TABLET | Freq: Every day | ORAL | Status: DC

## 2013-10-26 NOTE — Telephone Encounter (Signed)
Called pt per Junie Bame, NP; Hgb today is 9.9, will re-check labs at next clinic.

## 2013-11-02 ENCOUNTER — Ambulatory Visit (HOSPITAL_COMMUNITY): Payer: Self-pay | Admitting: *Deleted

## 2013-11-02 ENCOUNTER — Ambulatory Visit (HOSPITAL_COMMUNITY)
Admission: RE | Admit: 2013-11-02 | Discharge: 2013-11-02 | Disposition: A | Discharge status: Home / Self Care | Payer: Medicare Other | Source: Ambulatory Visit | Attending: Internal Medicine | Admitting: Internal Medicine

## 2013-11-02 ENCOUNTER — Other Ambulatory Visit: Payer: Self-pay | Admitting: *Deleted

## 2013-11-02 VITALS — BP 92/0 | HR 85 | Wt 228.0 lb

## 2013-11-02 DIAGNOSIS — Z95811 Presence of heart assist device: Secondary | ICD-10-CM | POA: Diagnosis not present

## 2013-11-02 DIAGNOSIS — D509 Iron deficiency anemia, unspecified: Secondary | ICD-10-CM | POA: Diagnosis not present

## 2013-11-02 DIAGNOSIS — E785 Hyperlipidemia, unspecified: Secondary | ICD-10-CM | POA: Insufficient documentation

## 2013-11-02 DIAGNOSIS — J438 Other emphysema: Secondary | ICD-10-CM | POA: Insufficient documentation

## 2013-11-02 DIAGNOSIS — I10 Essential (primary) hypertension: Secondary | ICD-10-CM | POA: Insufficient documentation

## 2013-11-02 DIAGNOSIS — Z79899 Other long term (current) drug therapy: Secondary | ICD-10-CM | POA: Insufficient documentation

## 2013-11-02 DIAGNOSIS — Z951 Presence of aortocoronary bypass graft: Secondary | ICD-10-CM | POA: Insufficient documentation

## 2013-11-02 DIAGNOSIS — Z7901 Long term (current) use of anticoagulants: Secondary | ICD-10-CM | POA: Insufficient documentation

## 2013-11-02 DIAGNOSIS — F329 Major depressive disorder, single episode, unspecified: Secondary | ICD-10-CM | POA: Insufficient documentation

## 2013-11-02 DIAGNOSIS — I5022 Chronic systolic (congestive) heart failure: Secondary | ICD-10-CM | POA: Diagnosis not present

## 2013-11-02 DIAGNOSIS — I2589 Other forms of chronic ischemic heart disease: Secondary | ICD-10-CM | POA: Insufficient documentation

## 2013-11-02 DIAGNOSIS — I2510 Atherosclerotic heart disease of native coronary artery without angina pectoris: Secondary | ICD-10-CM | POA: Insufficient documentation

## 2013-11-02 DIAGNOSIS — I252 Old myocardial infarction: Secondary | ICD-10-CM | POA: Insufficient documentation

## 2013-11-02 DIAGNOSIS — F3289 Other specified depressive episodes: Secondary | ICD-10-CM | POA: Insufficient documentation

## 2013-11-02 DIAGNOSIS — D649 Anemia, unspecified: Secondary | ICD-10-CM | POA: Insufficient documentation

## 2013-11-02 DIAGNOSIS — G4733 Obstructive sleep apnea (adult) (pediatric): Secondary | ICD-10-CM | POA: Insufficient documentation

## 2013-11-02 DIAGNOSIS — E669 Obesity, unspecified: Secondary | ICD-10-CM | POA: Insufficient documentation

## 2013-11-02 DIAGNOSIS — I4891 Unspecified atrial fibrillation: Secondary | ICD-10-CM | POA: Insufficient documentation

## 2013-11-02 DIAGNOSIS — E039 Hypothyroidism, unspecified: Secondary | ICD-10-CM | POA: Insufficient documentation

## 2013-11-02 DIAGNOSIS — I509 Heart failure, unspecified: Secondary | ICD-10-CM | POA: Insufficient documentation

## 2013-11-02 LAB — CBC
HEMATOCRIT: 35.6 % — AB (ref 39.0–52.0)
HEMOGLOBIN: 10.7 g/dL — AB (ref 13.0–17.0)
MCH: 31.7 pg (ref 26.0–34.0)
MCHC: 30.1 g/dL (ref 30.0–36.0)
MCV: 105.3 fL — ABNORMAL HIGH (ref 78.0–100.0)
Platelets: 148 10*3/uL — ABNORMAL LOW (ref 150–400)
RBC: 3.38 MIL/uL — ABNORMAL LOW (ref 4.22–5.81)
RDW: 18.8 % — ABNORMAL HIGH (ref 11.5–15.5)
WBC: 7.3 10*3/uL (ref 4.0–10.5)

## 2013-11-02 LAB — BASIC METABOLIC PANEL
BUN: 12 mg/dL (ref 6–23)
CHLORIDE: 102 meq/L (ref 96–112)
CO2: 24 mEq/L (ref 19–32)
Calcium: 9.7 mg/dL (ref 8.4–10.5)
Creatinine, Ser: 0.85 mg/dL (ref 0.50–1.35)
GFR calc Af Amer: >90 mL/min (ref >90)
GFR calc non Af Amer: 85 mL/min — ABNORMAL LOW (ref >90)
GLUCOSE: 108 mg/dL — AB (ref 70–99)
Potassium: 4.3 mEq/L (ref 3.7–5.3)
Sodium: 138 mEq/L (ref 137–147)

## 2013-11-02 LAB — PROTIME-INR
INR: 2.04 — ABNORMAL HIGH (ref 0.00–1.49)
Prothrombin Time: 22.4 seconds — ABNORMAL HIGH (ref 11.6–15.2)

## 2013-11-02 LAB — LACTATE DEHYDROGENASE: LDH: 296 U/L — AB (ref 94–250)

## 2013-11-02 LAB — PRO B NATRIURETIC PEPTIDE: Pro B Natriuretic peptide (BNP): 959.3 pg/mL — ABNORMAL HIGH (ref 0–125)

## 2013-11-02 NOTE — Progress Notes (Signed)
Patient ID: [name], male   DOB: [birth year], 73 y.o.   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 Quit smoking 2003. Uses CPAP and O2 every night.   He is s/p LVAD HM II implanted 01/12/13 under DT criteria  [facility] - 09/14/13 for increased fatigue and dyspnea. Prior to admit he had not required any lasix over 1 month period of time; he had been taking Lasix 20 mg PRN weight 224 pounds or greater. Weight was very stable. On admit found to have mild CHF and anemia.  He was diuresed with IV lasix and transitioned to PO lasix 40 mg twice a week. Overall he diuresed 9 lbs with discharge weight 203 lbs on inpatient scale.  Admitting labs revealed Hgb 7.7 for which he received 2 units PCs with appropriate rise in Hgb.   He returns for follow up. Last visit lasix was increased to 3 times a week. Denies SOB/PND/Orthopnea. Weight a thome 215-218 pounds. Compliant with meds.    Denies LVAD alarms. Denies driveline trauma, erythema or drainage.  Denies ICD shocks. Reports taking Coumadin as prescribed and adherence to anticoagulation based dietary restrictions. Denies bright red blood per rectum or melena, no dark urine or hematuria.   Past Medical History  Diagnosis Date  . Ischemic cardiomyopathy      CABG 2003, PCI 2007  EF 27%(myoview 2012  . CHF (congestive heart failure)     class I-II  . Intestine disorder     blockage  . Hyperlipidemia   . Hypothyroidism   . Chronic anticoagulation   . Obesity   . COPD (chronic obstructive pulmonary disease)   . Asbestosis(501)     "6 years in the [place]" (05/24/2013)  . Atrial fibrillation     paroxysmal; on coumadin  . Paroxysmal ventricular tachycardia     has ICD in place  . Coronary artery disease   . Automatic implantable cardioverter-defibrillator in situ   . Hypertension    . Myocardial infarction 1990's-2000    "2 in  ~ the 1990's; 1 in ~ 2000" (05/24/2013)  . Exertional shortness of breath   . OSA on CPAP   . On home oxygen therapy     "2L q hs" (05/24/2013)  . Depression     Current Outpatient Prescriptions  Medication Sig Dispense Refill  . albuterol (PROVENTIL) (2.5 MG/3ML) 0.083% nebulizer solution Take 2.5 mg by nebulization every 6 (six) hours as needed for wheezing or shortness of breath.       [name] aspirin EC 81 MG tablet Take 1 tablet (81 mg total) by mouth daily.  30 tablet    . budesonide-formoterol (SYMBICORT) 160-4.5 MCG/ACT inhaler Inhale 2 puffs into the lungs 2 (two) times daily.      . cholecalciferol (VITAMIN D) 1000 UNITS tablet Take 1,000 Units by mouth daily. Take 2 capsules hs      . citalopram (CELEXA) 40 MG tablet Take 40 mg by mouth at bedtime.      . cyanocobalamin 1000 MCG tablet Take 1,000 mcg by mouth at bedtime.       . docusate sodium (COLACE) 100 MG capsule Take 200 mg by mouth at bedtime.       . ferrous gluconate (FERGON) 324 MG tablet Take 324 mg by mouth 3 (three) times daily with meals.      [name]  furosemide (LASIX) 40 MG tablet Take 40 mg by mouth 3 (three) times a week. Take 40 mg every Monday, Wednesday, and Friday. Take with potassium. Take extra dose if weight above 219 lbs      . levothyroxine (SYNTHROID, LEVOTHROID) 100 MCG tablet Take 50 mcg by mouth at bedtime.       . metoprolol succinate (TOPROL-XL) 25 MG 24 hr tablet Take 12.5-25 mg by mouth 2 (two) times daily. 1/2 tab am and 1 tab pm      . pantoprazole (PROTONIX) 40 MG tablet Take 40 mg by mouth at bedtime.      . potassium chloride SA (K-DUR,KLOR-CON) 20 MEQ tablet Take 20 mEq by mouth 3 (three) times a week. Take 20 meq every Monday, Wednesday, and Friday with Lasix Take extra K-Dur with any extra Lasix doses      . simvastatin (ZOCOR) 80 MG tablet Take 40 mg by mouth at bedtime.        [name] tiotropium (SPIRIVA) 18 MCG inhalation capsule Place 18 mcg into inhaler  and inhale daily.       . vitamin C (ASCORBIC ACID) 500 MG tablet Take 500 mg by mouth 2 (two) times daily.       [name] warfarin (COUMADIN) 5 MG tablet Take 5 mg by mouth daily.      . nitroGLYCERIN (NITROSTAT) 0.4 MG SL tablet Place 0.4 mg under the tongue every 5 (five) minutes as needed.         No current facility-administered medications for this encounter.    Review of patient's allergies indicates no known allergies.  REVIEW OF SYSTEMS: All systems negative except as listed in HPI, PMH and Problem list.   LVAD interrogation reveals:  Speed: 9200 Flow:  4.5 Power:  5.5 PI:  5.1 Alarms:  1 low flow 10/11/13 He was unaware Events:  Rare PI Fixed speed:  9200 Low speed limit: 8600  I reviewed the LVAD parameters from today, and compared the results to the patient's prior recorded data.  No programming changes were made.  The LVAD is functioning within specified parameters.  The patient performs LVAD self-test daily.  LVAD interrogation was negative for any significant power changes or alarms. There were a few PI events daily, but nothing concerning and none back to back. LVAD equipment check completed and is in good working order.  Back-up equipment present.   LVAD education done on emergency procedures and precautions and reviewed exit site care.  Filed Vitals:   11/02/13 1431  BP: 92/0  Pulse: 85  Weight: 228 lb (103.42 kg)  SpO2: 97%     Physical Exam: GENERAL: Well appearing, male; NAD; wife present HEENT: normal  NECK: Supple, JVP 6-7. 2+ bilaterally, no bruits.  No lymphadenopathy or thyromegaly appreciated.   CARDIAC:  Mechanical heart sounds with LVAD hum present.  LUNGS:  Clear to auscultation bilaterally.  ABDOMEN:  Soft, round, nontender, positive bowel sounds x4.     LVAD exit site: well-healed and incorporated.  Dressing dry and intact.  No erythema, drainage, odor or tenderness.  Stabilization device present and accurately applied.  Driveline dressing is being  changed daily per sterile technique. Changed in clinic EXTREMITIES:  Warm and dry, no cyanosis, clubbing, rash or 2+ edema  NEUROLOGIC:  Alert and oriented x 4.  Gait steady.  No aphasia.  No dysarthria.  Affect pleasant.      ASSESSMENT AND PLAN:   1) Chronic Systolic HF, s/p LVAD implant 12/2012 - NYHA IIsymptoms.  Volume status stable. Continue lasix M-W-F and Potassium 20 meq on the days he takes his diuretic.  - MAP 92 - will continue current regimen. - Check BMET/LDH/INR normal  2)  Anticoagulation management - INR goal 2.0-3.0. Patient has PAF.  - On Coumadin and ASA 325 mg. - Check INR--> 2.04  3) Anemia - Hgb 9.9 - Check CBC. Continue Iron.    4 LVAD- - Doing great with 1 one low flow alarm. He was unaware.    Follow up 6 weeks.  [name],AMY NP-C  2:52 PM

## 2013-11-02 NOTE — Progress Notes (Signed)
Symptom  Yes  No  Details   Angina         x Activity:   Claudication         x How far:   Syncope         x When:   Stroke         x   Orthopnea         x How many pillows:   2 for comfort  PND         x How often:  CPAP        x  How many hrs:  all night  Pedal edema         x   Abd fullness         x   N&V         x   Diaphoresis         x When:  Bleeding        x   Urine color    medium yellow  SOB        x  Activity:  incline  Palpitations         x When:  ICD shock         x   Hospitlizaitons               x When/where/why:    ED visit         x When/where/why:  Other MD    When/who/why:  Activity    No formal activity; sedentary  Fluid    No limitations  Diet    No limitations   Vital signs: HR:  85 MAP BP:   92 O2 Sat: 97 Wt:  228 lbs Last wt: (clinic):  226.4 Ht: 6'1"  LVAD interrogation reveals:  Speed: 9200 Flow:  4.2 Power:  5.1 PI:  6.2 Alarms:  Low flow on 10/11/13 Events:  Rare PI Fixed speed:  9200 Low speed limit: 8600  LVAD exit site:  Well healed and incorporated. The velour is fully implanted at exit site. Dressing dry and intact. No erythema or drainage. Stabilization device present and accurately applied. Driveline dressing is being changed weekly per sterile technique using Sorbaview dressing with biopatch on exit site. Pt denies fever or chills. Pt/caregiver state they have adequate dressing supplies at home.  Pt/caregiver deny any alarms or VAD equipment issues. VAD coordinator reviewed daily log from home for daily temperature, weight, and VAD parameters. Pt is performing daily controller and system monitor self tests along with completing weekly and monthly maintenance for LVAD equipment.   LVAD equipment check completed and is in good working order. Back-up equipment present. LVAD education done on emergency procedures and precautions and reviewed exit site care.

## 2013-11-02 NOTE — Patient Instructions (Signed)
1.  No change in medications 2.  Return to [facility] 2 months 3.  INR and CBC in three weeks on 11/23/13

## 2013-11-15 ENCOUNTER — Encounter: Payer: Self-pay | Admitting: Internal Medicine

## 2013-11-23 ENCOUNTER — Other Ambulatory Visit: Payer: Medicare Other

## 2013-11-24 ENCOUNTER — Ambulatory Visit (HOSPITAL_COMMUNITY): Payer: Self-pay | Admitting: *Deleted

## 2013-11-24 ENCOUNTER — Other Ambulatory Visit (INDEPENDENT_AMBULATORY_CARE_PROVIDER_SITE_OTHER): Payer: Medicare Other

## 2013-11-24 DIAGNOSIS — Z95811 Presence of heart assist device: Secondary | ICD-10-CM

## 2013-11-24 DIAGNOSIS — D619 Aplastic anemia, unspecified: Secondary | ICD-10-CM

## 2013-11-24 DIAGNOSIS — Z7901 Long term (current) use of anticoagulants: Secondary | ICD-10-CM

## 2013-11-24 DIAGNOSIS — D649 Anemia, unspecified: Secondary | ICD-10-CM

## 2013-11-24 DIAGNOSIS — I5022 Chronic systolic (congestive) heart failure: Secondary | ICD-10-CM

## 2013-11-24 LAB — PROTIME-INR
INR: 2.3 ratio — ABNORMAL HIGH (ref 0.8–1.0)
PROTHROMBIN TIME: 24.1 s — AB (ref 10.2–12.4)

## 2013-11-24 LAB — HEMOGLOBIN AND HEMATOCRIT, BLOOD
HCT: 31.2 % — ABNORMAL LOW (ref 39.0–52.0)
Hemoglobin: 9.8 g/dL — ABNORMAL LOW (ref 13.0–17.0)

## 2013-12-08 ENCOUNTER — Ambulatory Visit (INDEPENDENT_AMBULATORY_CARE_PROVIDER_SITE_OTHER): Payer: Medicare Other | Admitting: *Deleted

## 2013-12-08 ENCOUNTER — Ambulatory Visit (HOSPITAL_COMMUNITY): Payer: Self-pay | Admitting: *Deleted

## 2013-12-08 ENCOUNTER — Telehealth (HOSPITAL_COMMUNITY): Payer: Self-pay | Admitting: *Deleted

## 2013-12-08 DIAGNOSIS — Z7901 Long term (current) use of anticoagulants: Secondary | ICD-10-CM

## 2013-12-08 DIAGNOSIS — D619 Aplastic anemia, unspecified: Secondary | ICD-10-CM

## 2013-12-08 DIAGNOSIS — I5022 Chronic systolic (congestive) heart failure: Secondary | ICD-10-CM

## 2013-12-08 DIAGNOSIS — Z95811 Presence of heart assist device: Secondary | ICD-10-CM

## 2013-12-08 DIAGNOSIS — D649 Anemia, unspecified: Secondary | ICD-10-CM

## 2013-12-08 LAB — CBC WITH DIFFERENTIAL/PLATELET
Basophils Absolute: 0 10*3/uL (ref 0.0–0.1)
Basophils Relative: 0.3 % (ref 0.0–3.0)
EOS ABS: 0.3 10*3/uL (ref 0.0–0.7)
Eosinophils Relative: 3.3 % (ref 0.0–5.0)
Hemoglobin: 8.7 g/dL — ABNORMAL LOW (ref 13.0–17.0)
LYMPHS ABS: 0.9 10*3/uL (ref 0.7–4.0)
Lymphocytes Relative: 9.9 % — ABNORMAL LOW (ref 12.0–46.0)
MCHC: 31.5 g/dL (ref 30.0–36.0)
MCV: 107.4 fl — ABNORMAL HIGH (ref 78.0–100.0)
MONO ABS: 0.5 10*3/uL (ref 0.1–1.0)
Monocytes Relative: 5.3 % (ref 3.0–12.0)
Neutro Abs: 7.7 10*3/uL (ref 1.4–7.7)
Neutrophils Relative %: 81.2 % — ABNORMAL HIGH (ref 43.0–77.0)
Platelets: 172 10*3/uL (ref 150.0–400.0)
RBC: 2.57 Mil/uL — ABNORMAL LOW (ref 4.22–5.81)
RDW: 19.7 % — AB (ref 11.5–14.6)
WBC: 9.4 10*3/uL (ref 4.5–10.5)

## 2013-12-08 LAB — PROTIME-INR
INR: 2.7 ratio — ABNORMAL HIGH (ref 0.8–1.0)
Prothrombin Time: 28 s — ABNORMAL HIGH (ref 10.2–12.4)

## 2013-12-08 NOTE — Telephone Encounter (Signed)
Called pt's wife to report Hgb results today of 8.7. Pt denies bleeding episodes. Wife states that today the pt feels dizzy "if he gets up too quick". Reports the following weights and VAD parameters this week:    Wt:  PI:  Power: Monday 222  6.4  5.0 Tuesday 220  6.9  5.9 Wednesday 219  6.5  5.4  Pt denies any syncopal episodes. Wife states he has been taking his iron 3 x daily; Lasix 40 mg MWF; states he took extra lasix 20 mg yesterday for "extra fluid".  Asked her to keep lasix 3 x weekly and repeat labs 2 weeks. Instructed her to call if symptoms do not improve or worsen. Will review with Dr. Haroldine Laws and call if any further instructions. Wife verbalized understanding of same.

## 2013-12-22 ENCOUNTER — Ambulatory Visit (INDEPENDENT_AMBULATORY_CARE_PROVIDER_SITE_OTHER): Payer: Medicare Other | Admitting: *Deleted

## 2013-12-22 ENCOUNTER — Ambulatory Visit (HOSPITAL_COMMUNITY): Payer: Self-pay | Admitting: *Deleted

## 2013-12-22 DIAGNOSIS — D649 Anemia, unspecified: Secondary | ICD-10-CM

## 2013-12-22 DIAGNOSIS — Z95811 Presence of heart assist device: Secondary | ICD-10-CM

## 2013-12-22 DIAGNOSIS — I5022 Chronic systolic (congestive) heart failure: Secondary | ICD-10-CM

## 2013-12-22 DIAGNOSIS — Z7901 Long term (current) use of anticoagulants: Secondary | ICD-10-CM

## 2013-12-22 LAB — CBC WITH DIFFERENTIAL/PLATELET
Basophils Absolute: 0 K/uL (ref 0.0–0.1)
Basophils Relative: 0.3 % (ref 0.0–3.0)
Eosinophils Absolute: 0.2 K/uL (ref 0.0–0.7)
Eosinophils Relative: 3 % (ref 0.0–5.0)
HCT: 30.5 % — ABNORMAL LOW (ref 39.0–52.0)
Hemoglobin: 9.3 g/dL — ABNORMAL LOW (ref 13.0–17.0)
Lymphocytes Relative: 11 % — ABNORMAL LOW (ref 12.0–46.0)
Lymphs Abs: 0.9 K/uL (ref 0.7–4.0)
MCHC: 30.6 g/dL (ref 30.0–36.0)
MCV: 107.3 fl — ABNORMAL HIGH (ref 78.0–100.0)
Monocytes Absolute: 0.4 K/uL (ref 0.1–1.0)
Monocytes Relative: 5.6 % (ref 3.0–12.0)
Neutro Abs: 6.4 K/uL (ref 1.4–7.7)
Neutrophils Relative %: 80.1 % — ABNORMAL HIGH (ref 43.0–77.0)
Platelets: 160 K/uL (ref 150.0–400.0)
RBC: 2.85 Mil/uL — ABNORMAL LOW (ref 4.22–5.81)
RDW: 19.8 % — ABNORMAL HIGH (ref 11.5–14.6)
WBC: 8 K/uL (ref 4.5–10.5)

## 2013-12-22 LAB — PROTIME-INR
INR: 2.5 ratio — ABNORMAL HIGH (ref 0.8–1.0)
Prothrombin Time: 25.9 s — ABNORMAL HIGH (ref 10.2–12.4)

## 2013-12-30 ENCOUNTER — Telehealth (HOSPITAL_COMMUNITY): Payer: Self-pay | Admitting: *Deleted

## 2013-12-30 ENCOUNTER — Encounter (HOSPITAL_COMMUNITY): Payer: Medicare Other

## 2013-12-30 ENCOUNTER — Encounter: Payer: Medicare Other | Admitting: Internal Medicine

## 2013-12-30 NOTE — Telephone Encounter (Signed)
Pt called to cancel clinic appt today due to family issues. Called pt to reschedule, he is still at hospital with grandson. Will call pt tomorrow or Monday to re-schedule VAD clinic appt. Pt verbalized understanding of same.

## 2013-12-31 ENCOUNTER — Telehealth: Payer: Self-pay

## 2013-12-31 NOTE — Telephone Encounter (Signed)
Pt called and cancelled appt for 01/04/2014 with Dr Caryl Comes, states he has a LVAD now and does not need to r/s.Nevin Bloodgood informed

## 2014-01-04 ENCOUNTER — Other Ambulatory Visit (HOSPITAL_COMMUNITY): Payer: Self-pay | Admitting: *Deleted

## 2014-01-04 ENCOUNTER — Inpatient Hospital Stay (HOSPITAL_COMMUNITY): Admission: RE | Admit: 2014-01-04 | Payer: Medicare Other | Source: Ambulatory Visit

## 2014-01-04 ENCOUNTER — Ambulatory Visit (HOSPITAL_COMMUNITY): Payer: Self-pay | Admitting: *Deleted

## 2014-01-04 ENCOUNTER — Ambulatory Visit (HOSPITAL_COMMUNITY)
Admission: RE | Admit: 2014-01-04 | Discharge: 2014-01-04 | Disposition: A | Discharge status: Home / Self Care | Payer: Medicare Other | Source: Ambulatory Visit | Attending: Cardiology | Admitting: Cardiology

## 2014-01-04 ENCOUNTER — Encounter: Payer: Medicare Other | Admitting: Internal Medicine

## 2014-01-04 VITALS — BP 104/0 | HR 60 | Ht 73.0 in | Wt 227.8 lb

## 2014-01-04 DIAGNOSIS — I255 Ischemic cardiomyopathy: Secondary | ICD-10-CM

## 2014-01-04 DIAGNOSIS — G4733 Obstructive sleep apnea (adult) (pediatric): Secondary | ICD-10-CM | POA: Insufficient documentation

## 2014-01-04 DIAGNOSIS — Z7901 Long term (current) use of anticoagulants: Secondary | ICD-10-CM

## 2014-01-04 DIAGNOSIS — I4891 Unspecified atrial fibrillation: Secondary | ICD-10-CM | POA: Insufficient documentation

## 2014-01-04 DIAGNOSIS — J4489 Other specified chronic obstructive pulmonary disease: Secondary | ICD-10-CM | POA: Insufficient documentation

## 2014-01-04 DIAGNOSIS — I10 Essential (primary) hypertension: Secondary | ICD-10-CM | POA: Insufficient documentation

## 2014-01-04 DIAGNOSIS — Z7982 Long term (current) use of aspirin: Secondary | ICD-10-CM | POA: Insufficient documentation

## 2014-01-04 DIAGNOSIS — E669 Obesity, unspecified: Secondary | ICD-10-CM | POA: Insufficient documentation

## 2014-01-04 DIAGNOSIS — D649 Anemia, unspecified: Secondary | ICD-10-CM | POA: Insufficient documentation

## 2014-01-04 DIAGNOSIS — Z951 Presence of aortocoronary bypass graft: Secondary | ICD-10-CM | POA: Insufficient documentation

## 2014-01-04 DIAGNOSIS — I252 Old myocardial infarction: Secondary | ICD-10-CM | POA: Insufficient documentation

## 2014-01-04 DIAGNOSIS — E039 Hypothyroidism, unspecified: Secondary | ICD-10-CM | POA: Insufficient documentation

## 2014-01-04 DIAGNOSIS — I5022 Chronic systolic (congestive) heart failure: Secondary | ICD-10-CM | POA: Diagnosis not present

## 2014-01-04 DIAGNOSIS — Z95811 Presence of heart assist device: Secondary | ICD-10-CM

## 2014-01-04 DIAGNOSIS — I2510 Atherosclerotic heart disease of native coronary artery without angina pectoris: Secondary | ICD-10-CM | POA: Insufficient documentation

## 2014-01-04 DIAGNOSIS — I509 Heart failure, unspecified: Secondary | ICD-10-CM | POA: Insufficient documentation

## 2014-01-04 DIAGNOSIS — R259 Unspecified abnormal involuntary movements: Secondary | ICD-10-CM | POA: Insufficient documentation

## 2014-01-04 DIAGNOSIS — I2589 Other forms of chronic ischemic heart disease: Secondary | ICD-10-CM

## 2014-01-04 DIAGNOSIS — E785 Hyperlipidemia, unspecified: Secondary | ICD-10-CM | POA: Insufficient documentation

## 2014-01-04 DIAGNOSIS — J449 Chronic obstructive pulmonary disease, unspecified: Secondary | ICD-10-CM | POA: Insufficient documentation

## 2014-01-04 DIAGNOSIS — Z79899 Other long term (current) drug therapy: Secondary | ICD-10-CM | POA: Insufficient documentation

## 2014-01-04 DIAGNOSIS — Z95810 Presence of automatic (implantable) cardiac defibrillator: Secondary | ICD-10-CM | POA: Insufficient documentation

## 2014-01-04 DIAGNOSIS — Z9981 Dependence on supplemental oxygen: Secondary | ICD-10-CM | POA: Insufficient documentation

## 2014-01-04 DIAGNOSIS — D509 Iron deficiency anemia, unspecified: Secondary | ICD-10-CM

## 2014-01-04 LAB — COMPREHENSIVE METABOLIC PANEL
ALT: 12 U/L (ref 0–53)
AST: 30 U/L (ref 0–37)
Albumin: 3.2 g/dL — ABNORMAL LOW (ref 3.5–5.2)
Alkaline Phosphatase: 63 U/L (ref 39–117)
BUN: 17 mg/dL (ref 6–23)
CALCIUM: 9.6 mg/dL (ref 8.4–10.5)
CO2: 23 mEq/L (ref 19–32)
CREATININE: 0.81 mg/dL (ref 0.50–1.35)
Chloride: 105 mEq/L (ref 96–112)
GFR calc Af Amer: >90 mL/min (ref >90)
GFR, EST NON AFRICAN AMERICAN: 87 mL/min — AB (ref >90)
GLUCOSE: 147 mg/dL — AB (ref 70–99)
Potassium: 4.4 mEq/L (ref 3.7–5.3)
SODIUM: 142 meq/L (ref 137–147)
TOTAL PROTEIN: 6.2 g/dL (ref 6.0–8.3)
Total Bilirubin: 0.4 mg/dL (ref 0.3–1.2)

## 2014-01-04 LAB — CBC
HCT: 32 % — ABNORMAL LOW (ref 39.0–52.0)
Hemoglobin: 9.8 g/dL — ABNORMAL LOW (ref 13.0–17.0)
MCH: 33.8 pg (ref 26.0–34.0)
MCHC: 30.6 g/dL (ref 30.0–36.0)
MCV: 110.3 fL — ABNORMAL HIGH (ref 78.0–100.0)
Platelets: 163 10*3/uL (ref 150–400)
RBC: 2.9 MIL/uL — ABNORMAL LOW (ref 4.22–5.81)
RDW: 16.5 % — ABNORMAL HIGH (ref 11.5–15.5)
WBC: 7.6 10*3/uL (ref 4.0–10.5)

## 2014-01-04 LAB — PROTIME-INR
INR: 2.34 — AB (ref 0.00–1.49)
PROTHROMBIN TIME: 24.9 s — AB (ref 11.6–15.2)

## 2014-01-04 LAB — PREALBUMIN: PREALBUMIN: 22.2 mg/dL (ref 17.0–34.0)

## 2014-01-04 LAB — LACTATE DEHYDROGENASE: LDH: 313 U/L — ABNORMAL HIGH (ref 94–250)

## 2014-01-04 LAB — PRO B NATRIURETIC PEPTIDE: PRO B NATRI PEPTIDE: 595.8 pg/mL — AB (ref 0–125)

## 2014-01-04 NOTE — Progress Notes (Addendum)
Patient ID: [name], male   DOB: 05-29-1941, 73 y.o.   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 Quit smoking 2003. Uses CPAP and O2 every night.   He is s/p LVAD HM II implanted 01/12/13 under DT criteria  Admitted to [facility] 09/13/13 - 09/14/13 for increased fatigue and dyspnea. Prior to admit he had not required any lasix over 1 month period of time; he had been taking Lasix 20 mg PRN weight 224 pounds or greater. Weight was very stable. On admit found to have mild CHF and anemia.  He was diuresed with IV lasix and transitioned to PO lasix 40 mg twice a week. Overall he diuresed 9 lbs with discharge weight 203 lbs on inpatient scale.  Admitting labs revealed Hgb 7.7 for which he received 2 units PCs with appropriate rise in Hgb.   Follow up: Doing well. Denies SOB, orthopnea or CP. Needs an extra lasix every other week. Weight stable 219-222 lbs. Compliant with medications. Trying to follow low salt diet and drink less than 2L a day. Having increased balance issues and noticing increased hand tremors.    Denies LVAD alarms. Denies driveline trauma, erythema or drainage.  Denies ICD shocks. Reports taking Coumadin as prescribed and adherence to anticoagulation based dietary restrictions. Denies bright red blood per rectum or melena, no dark urine or hematuria.   Past Medical History  Diagnosis Date  . Ischemic cardiomyopathy      CABG 2003, PCI 2007  EF 27%(myoview 2012  . CHF (congestive heart failure)     class I-II  . Intestine disorder     blockage  . Hyperlipidemia   . Hypothyroidism   . Chronic anticoagulation   . Obesity   . COPD (chronic obstructive pulmonary disease)   . Asbestosis(501)     "6 years in the [place]" (05/24/2013)  . Atrial fibrillation     paroxysmal; on coumadin  . Paroxysmal ventricular tachycardia      has ICD in place  . Coronary artery disease   . Automatic implantable cardioverter-defibrillator in situ   . Hypertension   . Myocardial infarction 1990's-2000    "2 in  ~ the 1990's; 1 in ~ 2000" (05/24/2013)  . Exertional shortness of breath   . OSA on CPAP   . On home oxygen therapy     "2L q hs" (05/24/2013)  . Depression     Current Outpatient Prescriptions  Medication Sig Dispense Refill  . albuterol (PROVENTIL) (2.5 MG/3ML) 0.083% nebulizer solution Take 2.5 mg by nebulization every 6 (six) hours as needed for wheezing or shortness of breath.       [name] aspirin EC 81 MG tablet Take 1 tablet (81 mg total) by mouth daily.  30 tablet    . budesonide-formoterol (SYMBICORT) 160-4.5 MCG/ACT inhaler Inhale 2 puffs into the lungs 2 (two) times daily.      . cholecalciferol (VITAMIN D) 1000 UNITS tablet Take 1,000 Units by mouth daily. Take 2 capsules hs      . citalopram (CELEXA) 40 MG tablet Take 40 mg by mouth at bedtime.      . cyanocobalamin 1000 MCG tablet Take 1,000 mcg by mouth at bedtime.       . docusate sodium (COLACE) 100 MG capsule Take 200 mg by mouth at bedtime.       [name]  ferrous gluconate (FERGON) 324 MG tablet Take 324 mg by mouth 3 (three) times daily with meals.      . furosemide (LASIX) 40 MG tablet Take 40 mg by mouth 3 (three) times a week. Take 40 mg every Monday, Wednesday, and Friday. Take with potassium. Take extra dose if weight above 219 lbs      . levothyroxine (SYNTHROID, LEVOTHROID) 100 MCG tablet Take 50 mcg by mouth at bedtime.       . metoprolol succinate (TOPROL-XL) 25 MG 24 hr tablet Take 12.5-25 mg by mouth 2 (two) times daily. 1/2 tab am and 1 tab pm      . pantoprazole (PROTONIX) 40 MG tablet Take 40 mg by mouth at bedtime.      . potassium chloride SA (K-DUR,KLOR-CON) 20 MEQ tablet Take 20 mEq by mouth 3 (three) times a week. Take 20 meq every Monday, Wednesday, and Friday with Lasix Take extra K-Dur with any extra Lasix doses      . simvastatin  (ZOCOR) 80 MG tablet Take 40 mg by mouth at bedtime.        [name] tiotropium (SPIRIVA) 18 MCG inhalation capsule Place 18 mcg into inhaler and inhale daily.       . vitamin C (ASCORBIC ACID) 500 MG tablet Take 500 mg by mouth 2 (two) times daily.       [name] warfarin (COUMADIN) 5 MG tablet Take 5 mg by mouth daily.      . nitroGLYCERIN (NITROSTAT) 0.4 MG SL tablet Place 0.4 mg under the tongue every 5 (five) minutes as needed.         No current facility-administered medications for this encounter.    Review of patient's allergies indicates no known allergies.  REVIEW OF SYSTEMS: All systems negative except as listed in HPI, PMH and Problem list.   LVAD interrogation reveals:  Speed: 9200 Flow:  5.9 Power:  5.2 PI:  6.8 Alarms:  3 Low Flows Does not recall them ( 2 01/01/14 and 1 on 12/28/13) Events:  Rare PI Fixed speed:  9200 Low speed limit: 8600  I reviewed the LVAD parameters from today, and compared the results to the patient's prior recorded data.  No programming changes were made.  The LVAD is functioning within specified parameters.  The patient performs LVAD self-test daily.  LVAD interrogation was negative for any significant power changes or alarms. There were a few PI events daily, but nothing concerning and none back to back. LVAD equipment check completed and is in good working order.  Back-up equipment present.   LVAD education done on emergency procedures and precautions and reviewed exit site care.  Filed Vitals:   01/04/14 0934  BP: 104/0  Pulse: 60  Height: 6\' 1"  (1.854 m)  Weight: 227 lb 12.8 oz (103.329 kg)  SpO2: 97%  Repeat MAP 90    Physical Exam: GENERAL: Well appearing, male; NAD; wife present HEENT: normal  NECK: Supple, JVP 6-7. 2+ bilaterally, no bruits.  No lymphadenopathy or thyromegaly appreciated.   CARDIAC:  Mechanical heart sounds with LVAD hum present.  LUNGS:  Clear to auscultation bilaterally.  ABDOMEN:  Soft, round, nontender, positive bowel  sounds x4.     LVAD exit site: well-healed and incorporated.  Dressing dry and intact.  No erythema, drainage, odor or tenderness.  Stabilization device present and accurately applied.  Driveline dressing is being changed daily per sterile technique. Changed in clinic EXTREMITIES:  Warm and dry, no cyanosis, clubbing, no edema NEUROLOGIC:  Alert and oriented x 4.  Gait steady.  No aphasia.  No dysarthria.  Affect pleasant.      ASSESSMENT AND PLAN:   1) Chronic Systolic HF: s/p LVAD implant 12/2012 - NYHA II symptoms and volume status stable. Will continue lasix 40 mg M-W-F. With the few low flow alarms he has received I wonder if they are related to volume. Change dry weight to 220-225 lbs. Instructed to hold lasix if weight less than 220 lbs and if weight > than 225 lbs can take an additional 20 mg lasix.  - Check BMET today. - MAP on higher side of normal 90, will continue to follow. Continue Toprol 12.5 mg q am and 25 mg q pm. - He is not currently on an ACE-I can try adding next visit.  - Reinforced the need and importance of daily weights, a low sodium diet, and fluid restriction (less than 2 L a day). Instructed to call the HF clinic if weight increases more than 3 lbs overnight or 5 lbs in a week.  2)  Anticoagulation management  - INR goal 2.0-3.0. Check today and adjust accordingly.  - On Coumadin and ASA 81 mg for LVAD. Tried 325 mg ASA in the past for LVAD, however did not tolerate.  - no bleeding issues 3) Anemia - Check CBC today. Continue ferrous sulfate and colace. - ASA cut back to 81 mg daily after needing 2 PRBC in December of last year.  4 LVAD - No issues. All parameters within nl limits. As above will adjust diuretics with transient low flow alarms. - Check LDH, INR, CBC, CMET and pro-BNP today - LVAD Coordinator did Intermacs 6 min walk today, QOL survey and trailmaking test ( 1 year follow up) - Maintenance performed on power module and Charity fundraiser.  5. Tremors -  Wife reports increasing. I did not notice much tremor on exam. Full physical later this month with PCP at [facility] will continue to follow. Hypothyroidism managed by PCP and likely needs recheck of TSH.  6. PAF - on Coumadin and BB. Rate controlled. Will get EKG next visit.   F/U 2 months Junie Bame B NP-C  10:05 AM  Patient seen and examined with Junie Bame, NP. We discussed all aspects of the encounter. I agree with the assessment and plan as stated above. Doing very well with VAD therapy. Will continue current regimen. Continue to follow BP. Volume status looks good. Evaluation of tremor per PCP. VAD interrogation looks ok.  Shaune Pascal Briggs Edelen,MD 10:19 AM

## 2014-01-04 NOTE — Progress Notes (Signed)
Symptom  Yes  No  Details   Angina         x Activity:   Claudication         x How far:   Syncope         x When:   Stroke         x   Orthopnea         x How many pillows:   2 for comfort  PND         x How often:  CPAP        x  How many hrs:  all night  Pedal edema         x   Abd fullness         x   N&V         x   Diaphoresis         x When:  Bleeding        x   Urine color    medium yellow  SOB        x  Activity:  incline  Palpitations         x When:  ICD shock         x   Hospitlizaitons               x When/where/why:    ED visit         x When/where/why:  Other MD       x  When/who/[facility]: [facility] Elberta Fortis; Dr. Aline August - routine physical  Activity    No formal activity; sedentary  Fluid    No limitations  Diet    No limitations   Vital signs: HR:  60 MAP BP:  104; 90 O2 Sat: 97 Wt:  227.8 lbs Last wt: (clinic):  228 Ht: 6'1"  LVAD interrogation reveals:  Speed: 9200 Flow:  4.2 Power:  5.7 PI:  6.5 Alarms:  2 low flow alarms 01/01/14; one low flow on 12/28/13 Events: rare PI Fixed speed:  9200 Low speed limit: 8600  Event log downloaded and sent to [employer] waveforms for analysis of low flow alarms. Pt/wife deny hearing any alarms; alarms occurred during night while patient was sleeping.  LVAD exit site:  Well healed and incorporated. The velour is fully implanted at exit site. Dressing dry and intact. No erythema or drainage. Stabilization device present and accurately applied. Driveline dressing is being changed weekly per sterile technique using Sorbaview dressing with biopatch on exit site. Pt denies fever or chills. Dressing supplies provided to patient/caregiver.  Pt/caregiver deny any alarms or VAD equipment issues. VAD coordinator reviewed daily log from home for daily temperature, weight, and VAD parameters. Pt is performing daily controller and system monitor self tests along with completing weekly and monthly maintenance for LVAD equipment. LVAD  equipment check completed and is in good working order. Back-up equipment present. Back up system controller battery charged during clinic visit.    Reviewed and demonstrated the following to patient and caregiver:    Reviewed the steps for replacing the running system controller with the back-up system controller (see patient handbook section 2 or the appropriate pamphlet).                 X  Demonstrated (using the mock-driveline and controller) how to connect and disconnect the mock-driveline in back up system controller in a timely manner (less than 10 seconds) with return demonstration by patient and caregiver.  X  Reviewed system controller alarms and troubleshooting including hazard and advisory alarms and accessing alarm history. Re-enforced NOT TO attempt to perform any task displayed on the display screen alone or without calling the VAD pager [phone] (see patient handbook section 5 or the appropriate pamphlet).              X  Reviewed how to handle an emergency including when the pump is running and when the pump has stopped (see patient handbook section 8). Call 911 first and then the VAD pager at [phone].           X  Reviewed 14-volt lithium ion battery calibration steps (see patient handbook section 3).          X  Reviewed contents of black bag and what must be with patient at all times.          X  Reviewed driveline exit site including cleansing, dressing, and immobilizing with an anchor device to prevent exit site trauma.           X  Reviewed weekly maintenance which includes: Reviewing "Replacing the [place] with a [employer]" pamphlet. Clean batteries, clips, and battery charger contacts.  Check cables for damages. Rotate batteries; keep all 8 charged.             X  Reviewed monthly maintenance which includes: Reviewing Alarms and Troubleshooting. Check battery manufacturer dates. Check use/charge cycles for each battery;  remember to re-calibrate every 70 uses when prompted.             X  Additional pamphlets Guide to Replacing the [place] with the [place] for Patients and Their Caregivers and HM II Alarms for Patients and Their Caregivers given to patient.             X   Batteries Manufacture Date: Number of uses: Re-calibration  10/2012 @ 120 Performed by patient   Annual maintenance completed per [employer] on patient's home power module and Electrical engineer.    Backup system controller 11 volt battery charged during visit.   1 year Intermacs follow up completed including:  Quality of Life, KCCQ-12, and Neurocognitive trail making.   Pt completed 600 feet during 6 minute walk with several rest stops. Did c/o upper leg pain during first 100 feet and SOB during last 500 feet.

## 2014-01-04 NOTE — Patient Instructions (Addendum)
1.  Hold Lasix if weight < 220 lbs 2.  May take extra Lasix 20 mg if weight > 225 lbs 3.  No change in coumadin dose; re-check INR in 3 weeks on 01/25/14 4.  Return to clinic in two months

## 2014-01-07 ENCOUNTER — Encounter: Payer: Self-pay | Admitting: *Deleted

## 2014-01-25 ENCOUNTER — Other Ambulatory Visit (INDEPENDENT_AMBULATORY_CARE_PROVIDER_SITE_OTHER): Payer: Non-veteran care | Admitting: *Deleted

## 2014-01-25 ENCOUNTER — Other Ambulatory Visit (HOSPITAL_COMMUNITY): Payer: Self-pay | Admitting: *Deleted

## 2014-01-25 ENCOUNTER — Ambulatory Visit (HOSPITAL_COMMUNITY): Payer: Self-pay | Admitting: *Deleted

## 2014-01-25 DIAGNOSIS — Z7901 Long term (current) use of anticoagulants: Secondary | ICD-10-CM | POA: Diagnosis not present

## 2014-01-25 DIAGNOSIS — Z95811 Presence of heart assist device: Secondary | ICD-10-CM

## 2014-01-25 DIAGNOSIS — D649 Anemia, unspecified: Secondary | ICD-10-CM

## 2014-01-25 DIAGNOSIS — I5022 Chronic systolic (congestive) heart failure: Secondary | ICD-10-CM

## 2014-01-25 LAB — PROTIME-INR
INR: 1.6 ratio — ABNORMAL HIGH (ref 0.8–1.0)
Prothrombin Time: 17.7 s — ABNORMAL HIGH (ref 9.6–13.1)

## 2014-01-25 MED ORDER — ENOXAPARIN SODIUM 100 MG/ML ~~LOC~~ SOLN: 100.0000 mg | Freq: Two times a day (BID) | SUBCUTANEOUS | Status: DC

## 2014-01-26 ENCOUNTER — Telehealth (HOSPITAL_COMMUNITY): Payer: Self-pay | Admitting: *Deleted

## 2014-01-26 NOTE — Telephone Encounter (Signed)
Called wife to confirm she started Lovenox 100 mg yesterday and will continue twice daily until INR re-checked Friday 01/28/14. She did give 10 mg dose yesterday and will continue 5 mg daily until INR re-checked.

## 2014-01-28 ENCOUNTER — Ambulatory Visit (INDEPENDENT_AMBULATORY_CARE_PROVIDER_SITE_OTHER): Payer: Non-veteran care | Admitting: *Deleted

## 2014-01-28 ENCOUNTER — Ambulatory Visit (HOSPITAL_COMMUNITY): Payer: Self-pay | Admitting: *Deleted

## 2014-01-28 DIAGNOSIS — Z95811 Presence of heart assist device: Secondary | ICD-10-CM | POA: Diagnosis not present

## 2014-01-28 DIAGNOSIS — Z7901 Long term (current) use of anticoagulants: Secondary | ICD-10-CM

## 2014-01-28 DIAGNOSIS — D649 Anemia, unspecified: Secondary | ICD-10-CM

## 2014-01-28 LAB — PROTIME-INR
INR: 2.3 ratio — ABNORMAL HIGH (ref 0.8–1.0)
PROTHROMBIN TIME: 25.4 s — AB (ref 9.6–13.1)

## 2014-02-03 ENCOUNTER — Ambulatory Visit (INDEPENDENT_AMBULATORY_CARE_PROVIDER_SITE_OTHER): Payer: Medicare Other | Admitting: Internal Medicine

## 2014-02-03 ENCOUNTER — Encounter: Payer: Self-pay | Admitting: Internal Medicine

## 2014-02-03 VITALS — BP 92/82 | HR 56 | Ht 72.0 in | Wt 223.8 lb

## 2014-02-03 DIAGNOSIS — I10 Essential (primary) hypertension: Secondary | ICD-10-CM

## 2014-02-03 DIAGNOSIS — I4891 Unspecified atrial fibrillation: Secondary | ICD-10-CM

## 2014-02-03 LAB — MDC_IDC_ENUM_SESS_TYPE_INCLINIC
Battery Voltage: 3.08 V
Brady Statistic RV Percent Paced: 0.04 %
Date Time Interrogation Session: 20150507093110
HIGH POWER IMPEDANCE MEASURED VALUE: 36 Ohm
HIGH POWER IMPEDANCE MEASURED VALUE: 44 Ohm
HighPow Impedance: 228 Ohm
HighPow Impedance: 247 Ohm
Lead Channel Impedance Value: 361 Ohm
Lead Channel Pacing Threshold Amplitude: 1 V
Lead Channel Sensing Intrinsic Amplitude: 5.25 mV
Lead Channel Setting Pacing Amplitude: 2.5 V
MDC IDC MSMT LEADCHNL RV PACING THRESHOLD PULSEWIDTH: 0.4 ms
MDC IDC SET LEADCHNL RV PACING PULSEWIDTH: 0.4 ms
MDC IDC SET LEADCHNL RV SENSING SENSITIVITY: 0.3 mV
MDC IDC SET ZONE DETECTION INTERVAL: 310 ms
Zone Setting Detection Interval: 250 ms
Zone Setting Detection Interval: 410 ms

## 2014-02-03 NOTE — Progress Notes (Signed)
Patient Care Team: Thressa Sheller, MD as PCP - General (Internal Medicine) Jolaine Artist, MD as Attending Physician (Cardiology) Ivin Poot, MD as Attending Physician (Cardiothoracic Surgery)   HPI  [name] is a 73 y.o. male Seen in followup for ICD implantation for primary prevention in the setting of ischemic heart disease with prior bypass surgery and depressed left ventricular function. He has permanent atrial fibrillation and is on Coumadin  He is on ASA 81 with VAD, low dose 2/2 bleeding    Myoview scanning 2012 demonstrating prior infarct with ejection fraction 25-30% with no ischemia    Followed by CHF clinic and recently implanted with VAD    He is doing much better  Scant and only occasional edema  Past Medical History  Diagnosis Date  . Ischemic cardiomyopathy      CABG 2003, PCI 2007  EF 27%(myoview 2012  . Chronic systolic heart failure   . Intestine disorder     blockage  . Hyperlipidemia   . Hypothyroidism   . Chronic anticoagulation     Afib and LVAD  . Obesity   . COPD (chronic obstructive pulmonary disease)   . Asbestosis(501)     "6 years in the [place]" (05/24/2013)  . Atrial fibrillation     permanent  . Paroxysmal ventricular tachycardia   . Coronary artery disease   . Implantable cardioverter-defibrillator Medtronic   . Hypertension   . Myocardial infarction 1990's-2000    "2 in  ~ the 1990's; 1 in ~ 2000" (05/24/2013)  . OSA on CPAP   . Depression   . LVAD (left ventricular assist device) present     Past Surgical History  Procedure Laterality Date  . Coronary artery bypass graft  2003    "?X4" (05/24/2013)  . Cardiac defibrillator placement  2004; ~ 2010  . Insertion of implantable left ventricular assist device N/A 01/12/2013    Procedure: INSERTION OF IMPLANTABLE LEFT VENTRICULAR ASSIST DEVICE;  Surgeon: Ivin Poot, MD;  Location: [facility];  Service: Open Heart Surgery;  Laterality: N/A;   nitric oxide; Redo  sternotomy  . Intraoperative transesophageal echocardiogram N/A 01/12/2013    Procedure: INTRAOPERATIVE TRANSESOPHAGEAL ECHOCARDIOGRAM;  Surgeon: Ivin Poot, MD;  Location: [facility];  Service: Open Heart Surgery;  Laterality: N/A;    Current Outpatient Prescriptions  Medication Sig Dispense Refill  . albuterol (PROVENTIL) (2.5 MG/3ML) 0.083% nebulizer solution Take 2.5 mg by nebulization every 6 (six) hours as needed for wheezing or shortness of breath.       [name] aspirin EC 81 MG tablet Take 1 tablet (81 mg total) by mouth daily.  30 tablet    . budesonide-formoterol (SYMBICORT) 160-4.5 MCG/ACT inhaler Inhale 2 puffs into the lungs 2 (two) times daily.      . cholecalciferol (VITAMIN D) 1000 UNITS tablet Take 1,000 Units by mouth daily. Take 2 capsules hs      . citalopram (CELEXA) 40 MG tablet Take 40 mg by mouth at bedtime.      . cyanocobalamin 1000 MCG tablet Take 1,000 mcg by mouth at bedtime.       . docusate sodium (COLACE) 100 MG capsule Take 200 mg by mouth at bedtime.       . enoxaparin (LOVENOX) 100 MG/ML injection Inject 1 mL (100 mg total) into the skin every 12 (twelve) hours.  6 Syringe  0  . ferrous gluconate (FERGON) 324 MG tablet Take 324 mg by mouth 3 (three) times  daily with meals.      . furosemide (LASIX) 40 MG tablet Take 40 mg by mouth as needed. Take 40 mg every Monday, Wednesday, and Friday. Take with potassium. Hold if weight less than 220 lbs.  May take extra 20 mg if weight above 225 lbs      . levothyroxine (SYNTHROID, LEVOTHROID) 100 MCG tablet Take 50 mcg by mouth at bedtime.       . metoprolol succinate (TOPROL-XL) 25 MG 24 hr tablet Take 12.5-25 mg by mouth 2 (two) times daily. 1/2 tab am and 1 tab pm      . nitroGLYCERIN (NITROSTAT) 0.4 MG SL tablet Place 0.4 mg under the tongue every 5 (five) minutes as needed.        . pantoprazole (PROTONIX) 40 MG tablet Take 40 mg by mouth at bedtime.      . potassium chloride SA (K-DUR,KLOR-CON) 20 MEQ tablet Take 20 mEq  by mouth as needed. Take 20 meq every Monday, Wednesday, and Friday with Lasix Take extra K-Dur with any extra Lasix doses      . simvastatin (ZOCOR) 80 MG tablet Take 40 mg by mouth at bedtime.        [name] tiotropium (SPIRIVA) 18 MCG inhalation capsule Place 18 mcg into inhaler and inhale daily.       . vitamin C (ASCORBIC ACID) 500 MG tablet Take 500 mg by mouth 2 (two) times daily.       [name] warfarin (COUMADIN) 5 MG tablet Take 5 mg by mouth daily.       No current facility-administered medications for this visit.    No Known Allergies  Review of Systems negative except from HPI and PMH  Physical Exam BP 92/82  Pulse 56  Ht 6' (1.829 m)  Wt 223 lb 12 oz (101.492 kg)  BMI 30.34 kg/m2 Well developed and well nourished in no acute distress HENT normal E scleral and icterus clear Neck Supple JVP flat; carotids brisk and full Clear to ausculation  Regular rate and rhythm, VAD hum Soft with active bowel sounds No clubbing cyanosis  Edema Alert and oriented, grossly normal motor and sensory function Skin Warm and Dry    Assessment and  Plan  Ventricular tachycardia  Ischemic cardiomyopathy  Congestive heart failure-chronic-systolic   atrial fibrillation-permanent  LVAD present  Implantable defibrillator-Medtronic  The patient's device was interrogated.  The information was reviewed. No changes were made in the programming.    His aspirin dose is an 81. I checked with Dr. Reine Just; this is intentional. There have been no intercurrent ventricular tachycardia. He is adequately anticoagulated on warfarin and aspirin.

## 2014-02-03 NOTE — Patient Instructions (Signed)
Your physician recommends that you continue on your current medications as directed. Please refer to the Current Medication list given to you today.  Your physician wants you to follow-up in: 1 year with Dr. Caryl Comes. You will receive a reminder letter in the mail two months in advance. If you don't receive a letter, please call our office to schedule the follow-up appointment.  Remote monitoring is used to monitor your Pacemaker of ICD from home. This monitoring reduces the number of office visits required to check your device to one time per year. It allows [place] to keep an eye on the functioning of your device to ensure it is working properly. You are scheduled for a device check from home on 05/09/14. You may send your transmission at any time that day. If you have a wireless device, the transmission will be sent automatically. After your physician reviews your transmission, you will receive a postcard with your next transmission date.

## 2014-02-05 ENCOUNTER — Telehealth (HOSPITAL_COMMUNITY): Payer: Self-pay | Admitting: *Deleted

## 2014-02-05 DIAGNOSIS — Z7901 Long term (current) use of anticoagulants: Secondary | ICD-10-CM

## 2014-02-05 DIAGNOSIS — Z95811 Presence of heart assist device: Secondary | ICD-10-CM

## 2014-02-07 ENCOUNTER — Ambulatory Visit (HOSPITAL_COMMUNITY): Payer: Self-pay | Admitting: *Deleted

## 2014-02-07 ENCOUNTER — Ambulatory Visit (INDEPENDENT_AMBULATORY_CARE_PROVIDER_SITE_OTHER): Payer: Medicare Other | Admitting: *Deleted

## 2014-02-07 ENCOUNTER — Telehealth (HOSPITAL_COMMUNITY): Payer: Self-pay | Admitting: *Deleted

## 2014-02-07 DIAGNOSIS — Z95811 Presence of heart assist device: Secondary | ICD-10-CM

## 2014-02-07 DIAGNOSIS — Z7901 Long term (current) use of anticoagulants: Secondary | ICD-10-CM

## 2014-02-07 LAB — LACTATE DEHYDROGENASE: LDH: 235 U/L (ref 94–250)

## 2014-02-07 LAB — CBC WITH DIFFERENTIAL/PLATELET
BASOS ABS: 0 10*3/uL (ref 0.0–0.1)
Basophils Relative: 0.4 % (ref 0.0–3.0)
EOS ABS: 0.2 10*3/uL (ref 0.0–0.7)
Eosinophils Relative: 3.3 % (ref 0.0–5.0)
HCT: 26.5 % — ABNORMAL LOW (ref 39.0–52.0)
LYMPHS ABS: 0.8 10*3/uL (ref 0.7–4.0)
Lymphocytes Relative: 11.4 % — ABNORMAL LOW (ref 12.0–46.0)
MCHC: 31.2 g/dL (ref 30.0–36.0)
MCV: 106.1 fl — ABNORMAL HIGH (ref 78.0–100.0)
MONO ABS: 0.5 10*3/uL (ref 0.1–1.0)
Monocytes Relative: 7.4 % (ref 3.0–12.0)
NEUTROS ABS: 5.2 10*3/uL (ref 1.4–7.7)
Neutrophils Relative %: 77.5 % — ABNORMAL HIGH (ref 43.0–77.0)
Platelets: 162 10*3/uL (ref 150.0–400.0)
RBC: 2.5 Mil/uL — ABNORMAL LOW (ref 4.22–5.81)
RDW: 18.2 % — ABNORMAL HIGH (ref 11.5–15.5)
WBC: 6.7 10*3/uL (ref 4.0–10.5)

## 2014-02-07 LAB — PROTIME-INR
INR: 2.2 ratio — ABNORMAL HIGH (ref 0.8–1.0)
Prothrombin Time: 23.6 s — ABNORMAL HIGH (ref 9.6–13.1)

## 2014-02-07 NOTE — Telephone Encounter (Signed)
Wife called VAD pager to report elevated flows of 10.0 on patient [name] controller. Pt noticed flow elevations this am. Normally, pt runs PI 4 - 5; flows 4.8 - 5.9, and powers 5 - 6, with speed 9200 RPM.  Today pt reports:  Speed:  9200  Flow:   10.0  Power:  8.0  PI:  4.9 Pt denies missing any coumadin doses. Denies heart failure symptoms or dark tea colored urine. Dr. Haroldine Laws updated - instructed wife to monitor VAD parameters and call VAD pager in powers reach 10 watts, pt becomes symptomatic with any heart failure symptoms, or urine becomes darker. Pt scheduled for labs Tues, will ask them to get labs Monday instead; plan to add LDH to INR and CBC. Wife verbalized understanding of same.

## 2014-02-07 NOTE — Telephone Encounter (Signed)
Called pt to f/u reported elevated powers and flow on HM II controller. Pt states all his numbers "went back to normal" Saturday evening. He denies any heart failure symptoms or tea colored urine. He will go for labs today instead of Tuesday.

## 2014-02-08 ENCOUNTER — Other Ambulatory Visit (HOSPITAL_COMMUNITY): Payer: Self-pay | Admitting: *Deleted

## 2014-02-08 ENCOUNTER — Other Ambulatory Visit: Payer: Self-pay | Admitting: *Deleted

## 2014-02-08 ENCOUNTER — Other Ambulatory Visit: Payer: Medicare Other

## 2014-02-08 ENCOUNTER — Telehealth (HOSPITAL_COMMUNITY): Payer: Self-pay | Admitting: *Deleted

## 2014-02-08 DIAGNOSIS — D649 Anemia, unspecified: Secondary | ICD-10-CM

## 2014-02-08 DIAGNOSIS — Z95811 Presence of heart assist device: Secondary | ICD-10-CM

## 2014-02-08 NOTE — Telephone Encounter (Signed)
Called pt's wife per Dr. Haroldine Laws. Instructed to come to admissions tomorrow at 1:45 pm to check in for short stay visit with labs. Pt will then return Thursday am at 8:00 am to short stay for blood transfusion of two units. Pt also needs referral to Dr. Hilarie Fredrickson for endoscopy/colonoscopy for continued anemia. Wife will speak with her [place] cardiologist for prior approval for any testing to be done at [facility] and agrees to referral. Wife verbalized understanding of above appts.

## 2014-02-09 ENCOUNTER — Encounter (HOSPITAL_COMMUNITY)
Admission: RE | Admit: 2014-02-09 | Discharge: 2014-02-09 | Disposition: A | Discharge status: Home / Self Care | Payer: Non-veteran care | Source: Ambulatory Visit | Attending: Internal Medicine | Admitting: Internal Medicine

## 2014-02-09 ENCOUNTER — Other Ambulatory Visit (HOSPITAL_COMMUNITY): Payer: Self-pay | Admitting: Anesthesiology

## 2014-02-09 DIAGNOSIS — Z95811 Presence of heart assist device: Secondary | ICD-10-CM | POA: Insufficient documentation

## 2014-02-09 DIAGNOSIS — I509 Heart failure, unspecified: Secondary | ICD-10-CM | POA: Insufficient documentation

## 2014-02-09 DIAGNOSIS — D649 Anemia, unspecified: Secondary | ICD-10-CM | POA: Insufficient documentation

## 2014-02-09 DIAGNOSIS — I2510 Atherosclerotic heart disease of native coronary artery without angina pectoris: Secondary | ICD-10-CM | POA: Insufficient documentation

## 2014-02-09 LAB — IRON AND TIBC
Iron: 49 ug/dL (ref 42–135)
Saturation Ratios: 13 % — ABNORMAL LOW (ref 20–55)
TIBC: 384 ug/dL (ref 215–435)
UIBC: 335 ug/dL (ref 125–400)

## 2014-02-09 LAB — PREPARE RBC (CROSSMATCH)

## 2014-02-10 ENCOUNTER — Encounter (HOSPITAL_COMMUNITY): Payer: Medicare Other

## 2014-02-10 ENCOUNTER — Encounter (HOSPITAL_COMMUNITY)
Admission: RE | Admit: 2014-02-10 | Discharge: 2014-02-10 | Disposition: A | Discharge status: Home / Self Care | Payer: Non-veteran care | Source: Ambulatory Visit | Attending: Internal Medicine | Admitting: Internal Medicine

## 2014-02-10 DIAGNOSIS — D649 Anemia, unspecified: Secondary | ICD-10-CM | POA: Diagnosis not present

## 2014-02-10 MED ORDER — SODIUM CHLORIDE 0.9 % IV SOLN
1020.0000 mg | Freq: Once | INTRAVENOUS | Status: AC
  Administered 2014-02-10: 1020 mg via INTRAVENOUS
  Filled 2014-02-10: qty 34

## 2014-02-10 NOTE — Progress Notes (Signed)
Junie Bame, PA in to see pt and to check B/P.  Mean was 88 and at pt's norm.  She also discussed fluid restrictions and intake today due to increase intake from receiving 2 units PRBC's and feraheme infusion.  Reminded pt to take lasix tomorrow as scheduled and to call for increased shortness of breath.  Will continue to monitor.  [name]

## 2014-02-10 NOTE — Progress Notes (Signed)
[place]

## 2014-02-11 LAB — TYPE AND SCREEN
ABO/RH(D): O POS
Antibody Screen: NEGATIVE
UNIT DIVISION: 0
Unit division: 0

## 2014-02-14 ENCOUNTER — Encounter: Payer: Self-pay | Admitting: Internal Medicine

## 2014-02-16 ENCOUNTER — Ambulatory Visit (INDEPENDENT_AMBULATORY_CARE_PROVIDER_SITE_OTHER): Payer: Medicare Other | Admitting: *Deleted

## 2014-02-16 ENCOUNTER — Ambulatory Visit (HOSPITAL_COMMUNITY): Payer: Self-pay | Admitting: *Deleted

## 2014-02-16 DIAGNOSIS — Z95811 Presence of heart assist device: Secondary | ICD-10-CM

## 2014-02-16 DIAGNOSIS — Z7901 Long term (current) use of anticoagulants: Secondary | ICD-10-CM

## 2014-02-16 DIAGNOSIS — I5022 Chronic systolic (congestive) heart failure: Secondary | ICD-10-CM

## 2014-02-16 DIAGNOSIS — D649 Anemia, unspecified: Secondary | ICD-10-CM

## 2014-02-16 LAB — CBC WITH DIFFERENTIAL/PLATELET
BASOS PCT: 0.1 % (ref 0.0–3.0)
Basophils Absolute: 0 10*3/uL (ref 0.0–0.1)
EOS PCT: 3.2 % (ref 0.0–5.0)
Eosinophils Absolute: 0.3 10*3/uL (ref 0.0–0.7)
HCT: 33.9 % — ABNORMAL LOW (ref 39.0–52.0)
HEMOGLOBIN: 10.8 g/dL — AB (ref 13.0–17.0)
LYMPHS PCT: 10 % — AB (ref 12.0–46.0)
Lymphs Abs: 0.9 10*3/uL (ref 0.7–4.0)
MCHC: 31.8 g/dL (ref 30.0–36.0)
MCV: 103.6 fl — ABNORMAL HIGH (ref 78.0–100.0)
MONOS PCT: 6.6 % (ref 3.0–12.0)
Monocytes Absolute: 0.6 10*3/uL (ref 0.1–1.0)
NEUTROS ABS: 7 10*3/uL (ref 1.4–7.7)
Neutrophils Relative %: 80.1 % — ABNORMAL HIGH (ref 43.0–77.0)
Platelets: 145 10*3/uL — ABNORMAL LOW (ref 150.0–400.0)
RBC: 3.28 Mil/uL — ABNORMAL LOW (ref 4.22–5.81)
RDW: 19.9 % — ABNORMAL HIGH (ref 11.5–15.5)
WBC: 8.7 10*3/uL (ref 4.0–10.5)

## 2014-02-16 LAB — LACTATE DEHYDROGENASE: LDH: 263 U/L — AB (ref 94–250)

## 2014-02-16 LAB — PROTIME-INR
INR: 1.7 ratio — ABNORMAL HIGH (ref 0.8–1.0)
PROTHROMBIN TIME: 18.9 s — AB (ref 9.6–13.1)

## 2014-02-23 ENCOUNTER — Ambulatory Visit (HOSPITAL_COMMUNITY): Payer: Self-pay | Admitting: *Deleted

## 2014-02-23 ENCOUNTER — Ambulatory Visit (INDEPENDENT_AMBULATORY_CARE_PROVIDER_SITE_OTHER): Payer: Medicare Other | Admitting: *Deleted

## 2014-02-23 DIAGNOSIS — Z7901 Long term (current) use of anticoagulants: Secondary | ICD-10-CM

## 2014-02-23 DIAGNOSIS — Z95811 Presence of heart assist device: Secondary | ICD-10-CM

## 2014-02-23 LAB — PROTIME-INR
INR: 2.8 ratio — ABNORMAL HIGH (ref 0.8–1.0)
PROTHROMBIN TIME: 30.5 s — AB (ref 9.6–13.1)

## 2014-02-25 ENCOUNTER — Other Ambulatory Visit (HOSPITAL_COMMUNITY): Payer: Self-pay | Admitting: *Deleted

## 2014-02-25 DIAGNOSIS — Z95811 Presence of heart assist device: Secondary | ICD-10-CM

## 2014-02-25 DIAGNOSIS — Z7901 Long term (current) use of anticoagulants: Secondary | ICD-10-CM

## 2014-03-03 ENCOUNTER — Ambulatory Visit: Payer: Self-pay | Admitting: *Deleted

## 2014-03-03 ENCOUNTER — Ambulatory Visit (HOSPITAL_COMMUNITY)
Admission: RE | Admit: 2014-03-03 | Discharge: 2014-03-03 | Disposition: A | Discharge status: Home / Self Care | Payer: Non-veteran care | Source: Ambulatory Visit | Attending: Internal Medicine | Admitting: Internal Medicine

## 2014-03-03 ENCOUNTER — Telehealth: Payer: Self-pay | Admitting: *Deleted

## 2014-03-03 ENCOUNTER — Ambulatory Visit (HOSPITAL_COMMUNITY)
Admission: RE | Admit: 2014-03-03 | Discharge: 2014-03-03 | Disposition: A | Discharge status: Home / Self Care | Payer: Medicare Other | Source: Ambulatory Visit | Attending: Cardiology | Admitting: Cardiology

## 2014-03-03 VITALS — BP 98/0 | HR 122 | Ht 73.0 in | Wt 229.2 lb

## 2014-03-03 DIAGNOSIS — Z95811 Presence of heart assist device: Secondary | ICD-10-CM | POA: Diagnosis not present

## 2014-03-03 DIAGNOSIS — I4891 Unspecified atrial fibrillation: Secondary | ICD-10-CM | POA: Diagnosis not present

## 2014-03-03 DIAGNOSIS — Z7901 Long term (current) use of anticoagulants: Secondary | ICD-10-CM

## 2014-03-03 DIAGNOSIS — I5022 Chronic systolic (congestive) heart failure: Secondary | ICD-10-CM | POA: Diagnosis not present

## 2014-03-03 DIAGNOSIS — D509 Iron deficiency anemia, unspecified: Secondary | ICD-10-CM | POA: Diagnosis not present

## 2014-03-03 DIAGNOSIS — I10 Essential (primary) hypertension: Secondary | ICD-10-CM

## 2014-03-03 LAB — CBC
HEMATOCRIT: 26.1 % — AB (ref 39.0–52.0)
Hemoglobin: 7.6 g/dL — ABNORMAL LOW (ref 13.0–17.0)
MCH: 32.1 pg (ref 26.0–34.0)
MCHC: 29.1 g/dL — AB (ref 30.0–36.0)
MCV: 110.1 fL — ABNORMAL HIGH (ref 78.0–100.0)
Platelets: 136 10*3/uL — ABNORMAL LOW (ref 150–400)
RBC: 2.37 MIL/uL — ABNORMAL LOW (ref 4.22–5.81)
RDW: 18.2 % — AB (ref 11.5–15.5)
WBC: 6.3 10*3/uL (ref 4.0–10.5)

## 2014-03-03 LAB — BASIC METABOLIC PANEL
BUN: 13 mg/dL (ref 6–23)
CALCIUM: 9.3 mg/dL (ref 8.4–10.5)
CO2: 23 mEq/L (ref 19–32)
Chloride: 107 mEq/L (ref 96–112)
Creatinine, Ser: 0.79 mg/dL (ref 0.50–1.35)
GFR, EST NON AFRICAN AMERICAN: 88 mL/min — AB (ref >90)
Glucose, Bld: 110 mg/dL — ABNORMAL HIGH (ref 70–99)
POTASSIUM: 4.4 meq/L (ref 3.7–5.3)
Sodium: 141 mEq/L (ref 137–147)

## 2014-03-03 LAB — PREPARE RBC (CROSSMATCH)

## 2014-03-03 LAB — LACTATE DEHYDROGENASE: LDH: 278 U/L — ABNORMAL HIGH (ref 94–250)

## 2014-03-03 LAB — PROTIME-INR
INR: 2.96 — AB (ref 0.00–1.49)
PROTHROMBIN TIME: 29.8 s — AB (ref 11.6–15.2)

## 2014-03-03 NOTE — Patient Instructions (Signed)
1.  Stop aspirin. 2.  Decrease coumadin to 5 mg daily. 3.  Will call with admission date and coumadin instructions.

## 2014-03-03 NOTE — Progress Notes (Signed)
Patient ID: [name], male   DOB: 06-Sep-1941, 73 y.o.   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 Quit smoking 2003. Uses CPAP and O2 every night.   He is s/p LVAD HM II implanted 01/12/13 under DT criteria  Admitted to [facility] 09/13/13 - 09/14/13 for increased fatigue and dyspnea. Prior to admit he had not required any lasix over 1 month period of time; he had been taking Lasix 20 mg PRN weight 224 pounds or greater. Weight was very stable. On admit found to have mild CHF and anemia.  He was diuresed with IV lasix and transitioned to PO lasix 40 mg twice a week. Overall he diuresed 9 lbs with discharge weight 203 lbs on inpatient scale.  Admitting labs revealed Hgb 7.7 for which he received 2 units PCs with appropriate rise in Hgb.   Follow up for Heart Failure: Patient had 2PRBCs on 02/10/14 for drop in HGB. Presents to clinic today with increased fatigue and SOB going up hills. Denies orthopnea, CP or edema. HR on arrival to clinic 122 and then 60 with rest. Compliant with medications. Trying to follow low salt diet and drink less than 2L a day. Having increased balance issues and noticing increased hand tremors. Had EP appointment and no changes took place.    Denies LVAD alarms. Denies driveline trauma, erythema or drainage.  Denies ICD shocks. Reports taking Coumadin as prescribed and adherence to anticoagulation based dietary restrictions. Denies bright red blood per rectum or melena, no dark urine or hematuria.   Past Medical History  Diagnosis Date  . Ischemic cardiomyopathy      CABG 2003, PCI 2007  EF 27%(myoview 2012  . Chronic systolic heart failure   . Intestine disorder     blockage  . Hyperlipidemia   . Hypothyroidism   . Chronic anticoagulation     Afib and LVAD  . Obesity   . COPD (chronic obstructive pulmonary  disease)   . Asbestosis(501)     "6 years in the [employer]" (05/24/2013)  . Atrial fibrillation     permanent  . Paroxysmal ventricular tachycardia   . Coronary artery disease   . Implantable cardioverter-defibrillator Medtronic   . Hypertension   . Myocardial infarction 1990's-2000    "2 in  ~ the 1990's; 1 in ~ 2000" (05/24/2013)  . OSA on CPAP   . Depression   . LVAD (left ventricular assist device) present     Current Outpatient Prescriptions  Medication Sig Dispense Refill  . albuterol (PROVENTIL) (2.5 MG/3ML) 0.083% nebulizer solution Take 2.5 mg by nebulization every 6 (six) hours as needed for wheezing or shortness of breath.       [name] aspirin EC 81 MG tablet Take 1 tablet (81 mg total) by mouth daily.  30 tablet    . budesonide-formoterol (SYMBICORT) 160-4.5 MCG/ACT inhaler Inhale 2 puffs into the lungs 2 (two) times daily.      . cholecalciferol (VITAMIN D) 1000 UNITS tablet Take 1,000 Units by mouth daily. Take 2 capsules hs      . citalopram (CELEXA) 40 MG tablet Take 40 mg by mouth at bedtime.      . cyanocobalamin 1000 MCG tablet Take 1,000 mcg by mouth at bedtime.       . docusate sodium (COLACE) 100 MG capsule Take 200 mg by mouth at  bedtime.       . ferrous gluconate (FERGON) 324 MG tablet Take 324 mg by mouth 3 (three) times daily with meals.      . furosemide (LASIX) 40 MG tablet Take 40 mg by mouth as needed. Take 40 mg every Monday, Wednesday, and Friday. Take with potassium. Hold if weight less than 220 lbs.  May take extra 20 mg if weight above 225 lbs      . levothyroxine (SYNTHROID, LEVOTHROID) 100 MCG tablet Take 50 mcg by mouth at bedtime.       . metoprolol succinate (TOPROL-XL) 25 MG 24 hr tablet Take 12.5-25 mg by mouth 2 (two) times daily. 1/2 tab am and 1 tab pm      . pantoprazole (PROTONIX) 40 MG tablet Take 40 mg by mouth at bedtime.      . potassium chloride SA (K-DUR,KLOR-CON) 20 MEQ tablet Take 20 mEq by mouth as needed. Take 20 meq every Monday, Wednesday,  and Friday with Lasix Take extra K-Dur with any extra Lasix doses      . simvastatin (ZOCOR) 80 MG tablet Take 40 mg by mouth at bedtime.        [name] tiotropium (SPIRIVA) 18 MCG inhalation capsule Place 18 mcg into inhaler and inhale daily.       . vitamin C (ASCORBIC ACID) 500 MG tablet Take 500 mg by mouth 2 (two) times daily.       [name] warfarin (COUMADIN) 5 MG tablet Take 5 mg by mouth daily.      [name] enoxaparin (LOVENOX) 100 MG/ML injection Inject 1 mL (100 mg total) into the skin every 12 (twelve) hours.  6 Syringe  0  . nitroGLYCERIN (NITROSTAT) 0.4 MG SL tablet Place 0.4 mg under the tongue every 5 (five) minutes as needed.         No current facility-administered medications for this encounter.    Review of patient's allergies indicates no known allergies.  REVIEW OF SYSTEMS: All systems negative except as listed in HPI, PMH and Problem list.   LVAD interrogation reveals:  Speed: 9200 Flow:  5.3 Power:  5.5 PI:  5.6 Alarms:  1 Low Flow alarm he was not aware of (02/22/14) Events:  Rare PI, except 5/26 4 and 5/23 7 Fixed speed:  9200 Low speed limit: 8600  I reviewed the LVAD parameters from today, and compared the results to the patient's prior recorded data.  No programming changes were made.  The LVAD is functioning within specified parameters.  The patient performs LVAD self-test daily.  LVAD interrogation was negative for any significant power changes or alarms. There were a few PI events daily, but nothing concerning and none back to back. LVAD equipment check completed and is in good working order.  Back-up equipment present.   LVAD education done on emergency procedures and precautions and reviewed exit site care.  Filed Vitals:   03/03/14 0958  BP: 98/0  Pulse: 122  Height: 6\' 1"  (1.854 m)  Weight: 229 lb 3.2 oz (103.964 kg)  SpO2: 95%  Repeat MAP 90    Physical Exam: GENERAL: Fatigued appearing, male; NAD; wife present HEENT: normal  NECK: Supple, JVP flat. 2+  bilaterally, no bruits.  No lymphadenopathy or thyromegaly appreciated.   CARDIAC:  Mechanical heart sounds with LVAD hum present.  LUNGS:  Clear to auscultation bilaterally.  ABDOMEN:  Soft, round, nontender, positive bowel sounds x4.     LVAD exit site: well-healed and incorporated.  Dressing dry and intact.  No erythema, drainage, odor or tenderness.  Stabilization device present and accurately applied.  Driveline dressing is being changed daily per sterile technique. Changed in clinic EXTREMITIES:  Warm and dry, no cyanosis, clubbing, no edema NEUROLOGIC:  Alert and oriented x 4.  Gait steady.  No aphasia.  No dysarthria.  Affect pleasant.      ASSESSMENT AND PLAN:   1) Chronic Systolic HF: s/p LVAD implant 12/2012 - NYHA II-III symptoms and volume status stable. Will continue lasix 40 mg M-W-F. Instructed to hold lasix if weight less than 220 lbs and if weight > than 225 lbs can take an additional 20 mg lasix.  - MAP on higher side of normal 98, will continue to follow. Continue Toprol 12.5 mg q am and 25 mg q pm.  - He is not currently on an ACE-I can try adding next visit.  - Reinforced the need and importance of daily weights, a low sodium diet, and fluid restriction (less than 2 L a day). Instructed to call the HF clinic if weight increases more than 3 lbs overnight or 5 lbs in a week.  2)  Anticoagulation management  -INR 2.96, will decrease to coumadin 5 mg daily. With Hgb trending down again will stop ASA and decrease INR goal to 2.0-2.5.  3) Anemia - CBC today showed Hgb back down after recent 2PRBCs. Hgb 7.6 and patient is symptomatic. HR 122 with ambulation. Will send to short stay for 2PRBCs and as above stop ASA. Will set up for admission for bridge with heparin and endo/colonoscopy.  4 LVAD - No issues. All parameters within nl limits. 1 low flow alarm likely related to bleeding. - LDH and Cr stable. As above Hgb low.  5. PAF - on Coumadin and BB. Rate controlled. Will get  EKG next visit.  6. Depression -Patient is more down likely related to having to come in for 2nd time in past month for transfusion. He is also very anxious about pump and will not let wife go anywhere without him. Discussed what to do in an emergency and will again next visit get pump out and discuss how to switch controller out. Continue celexa 40 mg daily.  F/U 1 month Rande Brunt NP-C  2:24 PM

## 2014-03-03 NOTE — Telephone Encounter (Signed)
Called pt's wife per Dr. Darcey Nora - instructed to hold coumadin today and will call with further instructions tomorrow. Wife verbalized understanding of same.

## 2014-03-03 NOTE — Progress Notes (Signed)
Pt tolerated blood transfusion well. No s/sx of reaction noted. VSS>

## 2014-03-03 NOTE — Progress Notes (Signed)
Symptom  Yes  No  Details   Angina         x Activity:   Claudication         x How far:   Syncope         x When:   Stroke         x   Orthopnea         x How many pillows:   2 for comfort  PND         x How often:  CPAP        x  How many hrs:  all night  Pedal edema         x   Abd fullness         x   N&V         x   Diaphoresis         x When:  Bleeding        x   Urine color    medium yellow  SOB        x  Activity:  incline  Palpitations         x When:  ICD shock         x   Hospitlizaitons        x        When/where/why:  02/10/14 2 units PC in short stay  ED visit         x When/where/why:  Other MD       x  When/who/why:  02/03/14 Dr. Caryl Comes for device check; no changes  Activity    No formal activity; sedentary  Fluid    No limitations  Diet    No limitations   Vital signs: HR:  122; 60 after rest MAP BP:  98 O2 Sat: 96 Wt:  229.2 lbs Last wt: (clinic):  227.8 Ht: 6'1"  LVAD interrogation reveals:  Speed: 9200 Flow:  5.3 Power:  5.5 PI:  5.6 Alarms:  1 low flow alarms 02/22/14  Events: rare PI          except 5/26:  4 PI events;    5/23:  7 PI events Fixed speed:  9200 Low speed limit: 8600  Pt/wife deny hearing any alarms; low flow alarm occurred morning when patient took lasix 40 mg when weight was 215 lbs (below designated dry weight of 220 lbs) and he had excessive diuresis.  Pt does c/o of controller getting "very hot".    LVAD exit site:  Well healed and incorporated. The velour is fully implanted at exit site. Dressing dry and intact. No erythema or drainage. Stabilization device present and accurately applied. Driveline dressing is being changed weekly per sterile technique using Sorbaview dressing with biopatch on exit site. Pt denies fever or chills. Dressing supplies provided to patient/caregiver.  Pt/caregiver deny any alarms or VAD equipment issues. VAD coordinator reviewed daily log from home for daily temperature, weight, and VAD parameters. Pt  is performing daily controller and system monitor self tests along with completing weekly and monthly maintenance for LVAD equipment. LVAD equipment check completed and is in good working order. Back-up equipment present. Back up system controller battery charged during clinic visit.

## 2014-03-04 ENCOUNTER — Telehealth (HOSPITAL_COMMUNITY): Payer: Self-pay | Admitting: *Deleted

## 2014-03-04 LAB — TYPE AND SCREEN
ABO/RH(D): O POS
ANTIBODY SCREEN: NEGATIVE
Unit division: 0
Unit division: 0

## 2014-03-04 NOTE — Telephone Encounter (Signed)
Called pt's wife per Dr. Haroldine Laws to inform her pt will be admitted Monday 03/07/14 for GI bleed. Admission will call pt when bed available on 2W. Pt is to take coumadin 5 mg today, then hold until admission. Wife verbalized understanding of same.

## 2014-03-07 ENCOUNTER — Inpatient Hospital Stay (HOSPITAL_COMMUNITY)
Admission: RE | Admit: 2014-03-07 | Discharge: 2014-03-11 | DRG: 812 | Disposition: A | Discharge status: Home / Self Care | Payer: Medicare Other | Source: Ambulatory Visit | Attending: Internal Medicine | Admitting: Internal Medicine

## 2014-03-07 ENCOUNTER — Encounter (HOSPITAL_COMMUNITY): Payer: Self-pay

## 2014-03-07 DIAGNOSIS — F329 Major depressive disorder, single episode, unspecified: Secondary | ICD-10-CM | POA: Diagnosis present

## 2014-03-07 DIAGNOSIS — I2510 Atherosclerotic heart disease of native coronary artery without angina pectoris: Secondary | ICD-10-CM | POA: Diagnosis present

## 2014-03-07 DIAGNOSIS — K8020 Calculus of gallbladder without cholecystitis without obstruction: Secondary | ICD-10-CM | POA: Diagnosis present

## 2014-03-07 DIAGNOSIS — D696 Thrombocytopenia, unspecified: Secondary | ICD-10-CM | POA: Diagnosis present

## 2014-03-07 DIAGNOSIS — Z951 Presence of aortocoronary bypass graft: Secondary | ICD-10-CM

## 2014-03-07 DIAGNOSIS — I5022 Chronic systolic (congestive) heart failure: Secondary | ICD-10-CM

## 2014-03-07 DIAGNOSIS — Z95811 Presence of heart assist device: Secondary | ICD-10-CM | POA: Diagnosis not present

## 2014-03-07 DIAGNOSIS — J438 Other emphysema: Secondary | ICD-10-CM | POA: Diagnosis present

## 2014-03-07 DIAGNOSIS — Z87891 Personal history of nicotine dependence: Secondary | ICD-10-CM

## 2014-03-07 DIAGNOSIS — E785 Hyperlipidemia, unspecified: Secondary | ICD-10-CM | POA: Diagnosis present

## 2014-03-07 DIAGNOSIS — D509 Iron deficiency anemia, unspecified: Secondary | ICD-10-CM | POA: Diagnosis not present

## 2014-03-07 DIAGNOSIS — K7460 Unspecified cirrhosis of liver: Secondary | ICD-10-CM | POA: Diagnosis present

## 2014-03-07 DIAGNOSIS — Z9861 Coronary angioplasty status: Secondary | ICD-10-CM

## 2014-03-07 DIAGNOSIS — K922 Gastrointestinal hemorrhage, unspecified: Secondary | ICD-10-CM | POA: Diagnosis not present

## 2014-03-07 DIAGNOSIS — I4729 Other ventricular tachycardia: Secondary | ICD-10-CM | POA: Diagnosis present

## 2014-03-07 DIAGNOSIS — I4891 Unspecified atrial fibrillation: Secondary | ICD-10-CM

## 2014-03-07 DIAGNOSIS — Z79899 Other long term (current) drug therapy: Secondary | ICD-10-CM

## 2014-03-07 DIAGNOSIS — I252 Old myocardial infarction: Secondary | ICD-10-CM

## 2014-03-07 DIAGNOSIS — E8809 Other disorders of plasma-protein metabolism, not elsewhere classified: Secondary | ICD-10-CM | POA: Diagnosis present

## 2014-03-07 DIAGNOSIS — Z7901 Long term (current) use of anticoagulants: Secondary | ICD-10-CM

## 2014-03-07 DIAGNOSIS — J61 Pneumoconiosis due to asbestos and other mineral fibers: Secondary | ICD-10-CM | POA: Diagnosis present

## 2014-03-07 DIAGNOSIS — I472 Ventricular tachycardia, unspecified: Secondary | ICD-10-CM | POA: Diagnosis present

## 2014-03-07 DIAGNOSIS — K259 Gastric ulcer, unspecified as acute or chronic, without hemorrhage or perforation: Secondary | ICD-10-CM

## 2014-03-07 DIAGNOSIS — G4733 Obstructive sleep apnea (adult) (pediatric): Secondary | ICD-10-CM | POA: Diagnosis present

## 2014-03-07 DIAGNOSIS — I10 Essential (primary) hypertension: Secondary | ICD-10-CM | POA: Diagnosis present

## 2014-03-07 DIAGNOSIS — K31811 Angiodysplasia of stomach and duodenum with bleeding: Secondary | ICD-10-CM

## 2014-03-07 DIAGNOSIS — F3289 Other specified depressive episodes: Secondary | ICD-10-CM | POA: Diagnosis present

## 2014-03-07 DIAGNOSIS — I789 Disease of capillaries, unspecified: Secondary | ICD-10-CM | POA: Diagnosis present

## 2014-03-07 DIAGNOSIS — D539 Nutritional anemia, unspecified: Principal | ICD-10-CM | POA: Diagnosis present

## 2014-03-07 DIAGNOSIS — E039 Hypothyroidism, unspecified: Secondary | ICD-10-CM | POA: Diagnosis present

## 2014-03-07 DIAGNOSIS — I2589 Other forms of chronic ischemic heart disease: Secondary | ICD-10-CM | POA: Diagnosis present

## 2014-03-07 DIAGNOSIS — I498 Other specified cardiac arrhythmias: Secondary | ICD-10-CM | POA: Diagnosis present

## 2014-03-07 DIAGNOSIS — R195 Other fecal abnormalities: Secondary | ICD-10-CM | POA: Diagnosis present

## 2014-03-07 DIAGNOSIS — I509 Heart failure, unspecified: Secondary | ICD-10-CM | POA: Diagnosis present

## 2014-03-07 DIAGNOSIS — D126 Benign neoplasm of colon, unspecified: Secondary | ICD-10-CM | POA: Diagnosis present

## 2014-03-07 DIAGNOSIS — K648 Other hemorrhoids: Secondary | ICD-10-CM | POA: Diagnosis present

## 2014-03-07 LAB — COMPREHENSIVE METABOLIC PANEL
ALBUMIN: 3.1 g/dL — AB (ref 3.5–5.2)
ALT: 12 U/L (ref 0–53)
AST: 24 U/L (ref 0–37)
Alkaline Phosphatase: 55 U/L (ref 39–117)
BILIRUBIN TOTAL: 0.5 mg/dL (ref 0.3–1.2)
BUN: 13 mg/dL (ref 6–23)
CHLORIDE: 106 meq/L (ref 96–112)
CO2: 26 meq/L (ref 19–32)
CREATININE: 0.8 mg/dL (ref 0.50–1.35)
Calcium: 9.7 mg/dL (ref 8.4–10.5)
GFR calc Af Amer: >90 mL/min (ref >90)
GFR, EST NON AFRICAN AMERICAN: 87 mL/min — AB (ref >90)
Glucose, Bld: 104 mg/dL — ABNORMAL HIGH (ref 70–99)
Potassium: 4.1 mEq/L (ref 3.7–5.3)
Sodium: 142 mEq/L (ref 137–147)
Total Protein: 5.7 g/dL — ABNORMAL LOW (ref 6.0–8.3)

## 2014-03-07 LAB — CBC WITH DIFFERENTIAL/PLATELET
Basophils Absolute: 0 10*3/uL (ref 0.0–0.1)
Basophils Relative: 0 % (ref 0–1)
Eosinophils Absolute: 0.3 10*3/uL (ref 0.0–0.7)
Eosinophils Relative: 4 % (ref 0–5)
HCT: 30.7 % — ABNORMAL LOW (ref 39.0–52.0)
Hemoglobin: 8.8 g/dL — ABNORMAL LOW (ref 13.0–17.0)
LYMPHS ABS: 0.7 10*3/uL (ref 0.7–4.0)
LYMPHS PCT: 11 % — AB (ref 12–46)
MCH: 31 pg (ref 26.0–34.0)
MCHC: 28.7 g/dL — ABNORMAL LOW (ref 30.0–36.0)
MCV: 108.1 fL — ABNORMAL HIGH (ref 78.0–100.0)
Monocytes Absolute: 0.5 10*3/uL (ref 0.1–1.0)
Monocytes Relative: 8 % (ref 3–12)
NEUTROS PCT: 77 % (ref 43–77)
Neutro Abs: 4.8 10*3/uL (ref 1.7–7.7)
PLATELETS: 135 10*3/uL — AB (ref 150–400)
RBC: 2.84 MIL/uL — AB (ref 4.22–5.81)
RDW: 19.1 % — ABNORMAL HIGH (ref 11.5–15.5)
WBC: 6.3 10*3/uL (ref 4.0–10.5)

## 2014-03-07 LAB — PROTIME-INR
INR: 1.39 (ref 0.00–1.49)
PROTHROMBIN TIME: 16.7 s — AB (ref 11.6–15.2)

## 2014-03-07 LAB — T4, FREE: Free T4: 1.19 ng/dL (ref 0.80–1.80)

## 2014-03-07 LAB — TYPE AND SCREEN
ABO/RH(D): O POS
Antibody Screen: NEGATIVE

## 2014-03-07 LAB — TSH: TSH: 4.77 u[IU]/mL — AB (ref 0.350–4.500)

## 2014-03-07 LAB — LACTATE DEHYDROGENASE: LDH: 295 U/L — AB (ref 94–250)

## 2014-03-07 MED ORDER — HEPARIN (PORCINE) IN NACL 100-0.45 UNIT/ML-% IJ SOLN
1600.0000 [IU]/h | INTRAMUSCULAR | Status: DC
  Administered 2014-03-07: 1700 [IU]/h via INTRAVENOUS
  Administered 2014-03-08: 1850 [IU]/h via INTRAVENOUS
  Administered 2014-03-08 – 2014-03-09 (×2): 1750 [IU]/h via INTRAVENOUS
  Filled 2014-03-07 (×3): qty 250

## 2014-03-07 MED ORDER — LEVOTHYROXINE SODIUM 50 MCG PO TABS
50.0000 ug | ORAL_TABLET | Freq: Every day | ORAL | Status: DC
  Administered 2014-03-09 – 2014-03-11 (×2): 50 ug via ORAL
  Filled 2014-03-07 (×4): qty 1

## 2014-03-07 MED ORDER — METOPROLOL SUCCINATE ER 25 MG PO TB24
25.0000 mg | ORAL_TABLET | Freq: Two times a day (BID) | ORAL | Status: DC
  Administered 2014-03-07: 25 mg via ORAL
  Filled 2014-03-07 (×3): qty 1

## 2014-03-07 MED ORDER — TIOTROPIUM BROMIDE MONOHYDRATE 18 MCG IN CAPS
18.0000 ug | ORAL_CAPSULE | Freq: Every day | RESPIRATORY_TRACT | Status: DC
  Administered 2014-03-08 – 2014-03-11 (×4): 18 ug via RESPIRATORY_TRACT
  Filled 2014-03-07: qty 5

## 2014-03-07 MED ORDER — ACETAMINOPHEN 650 MG RE SUPP: 650.0000 mg | RECTAL | Status: DC | PRN

## 2014-03-07 MED ORDER — CITALOPRAM HYDROBROMIDE 40 MG PO TABS
40.0000 mg | ORAL_TABLET | Freq: Every day | ORAL | Status: DC
  Administered 2014-03-07 – 2014-03-11 (×5): 40 mg via ORAL
  Filled 2014-03-07 (×5): qty 1

## 2014-03-07 MED ORDER — DOCUSATE SODIUM 100 MG PO CAPS
200.0000 mg | ORAL_CAPSULE | Freq: Every day | ORAL | Status: DC
  Administered 2014-03-07: 200 mg via ORAL
  Filled 2014-03-07 (×5): qty 2

## 2014-03-07 MED ORDER — BUDESONIDE-FORMOTEROL FUMARATE 160-4.5 MCG/ACT IN AERO
2.0000 | INHALATION_SPRAY | Freq: Two times a day (BID) | RESPIRATORY_TRACT | Status: DC
  Administered 2014-03-07 – 2014-03-11 (×8): 2 via RESPIRATORY_TRACT
  Filled 2014-03-07: qty 6

## 2014-03-07 MED ORDER — FERROUS GLUCONATE 324 (38 FE) MG PO TABS
324.0000 mg | ORAL_TABLET | Freq: Three times a day (TID) | ORAL | Status: DC
  Administered 2014-03-07 – 2014-03-08 (×2): 324 mg via ORAL
  Filled 2014-03-07 (×5): qty 1

## 2014-03-07 MED ORDER — SIMVASTATIN 40 MG PO TABS
40.0000 mg | ORAL_TABLET | Freq: Every day | ORAL | Status: DC
  Administered 2014-03-07 – 2014-03-10 (×4): 40 mg via ORAL
  Filled 2014-03-07 (×5): qty 1

## 2014-03-07 MED ORDER — PANTOPRAZOLE SODIUM 40 MG PO TBEC
40.0000 mg | DELAYED_RELEASE_TABLET | Freq: Two times a day (BID) | ORAL | Status: DC
  Administered 2014-03-07: 40 mg via ORAL
  Filled 2014-03-07: qty 1

## 2014-03-07 MED ORDER — ACETAMINOPHEN 325 MG PO TABS: 650.0000 mg | ORAL_TABLET | ORAL | Status: DC | PRN

## 2014-03-07 NOTE — H&P (Signed)
[facility]  Primary Cardiologist:  Dr. 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 Quit smoking 2003. Uses CPAP and O2 every night. He is s/p LVAD HM II implanted 01/12/13 under DT   Admitted to [facility] 09/13/13 - 09/14/13 for increased fatigue and dyspnea. On admit found to have mild CHF and anemia. He was diuresed with IV lasix and transitioned to PO lasix 40 mg twice a week. Overall he diuresed 9 lbs with discharge weight 203 lbs on inpatient scale. Admitting labs revealed Hgb 7.7 for which he received 2 units PCs with appropriate rise in Hgb.   He has needed to come to short stay 2 times in the past month for transfusions. He has needed a total of 4PRBC last. He received his last 2 PRBC lasix week and ASA was stopped. He will be admitted today for bridge with heparin and EGD/colonoscopy. Denies SOB, orthopnea or CP. Occasional fatigue and SOB with moderate exertion.   LVAD INTERROGATION:  HeartMate II LVAD:  Flow 4.9 liters/min, speed 9200, power 5.3, PI 6.2.    Denies LVAD alarms. Denies driveline trauma, erythema or drainage. Denies ICD shocks. Reports taking Coumadin as prescribed and adherence to anticoagulation based dietary restrictions. Denies bright red blood per rectum or melena, no dark urine or hematuria.   Review of Systems: [y] = yes, [ ]  = no   General: Weight gain Aqua.Slicker ]; Weight loss [ ] ; Anorexia [ ] ; Fatigue [ ] ; Fever [N ]; Chills [ ] ; Weakness [ ]   Cardiac: Chest pain/pressure [ ] ; Resting SOB [ N]; Exertional SOB [ N]; Orthopnea [ N]; Pedal Edema [Y ]; Palpitations [ ] ; Syncope [ ] ; Presyncope [ ] ; Paroxysmal nocturnal dyspnea[ ]   Pulmonary: Cough [ ] ; Wheezing[ ] ; Hemoptysis[ ] ; Sputum [ ] ; Snoring [ ]   GI: Vomiting[ ] ; Dysphagia[ ] ; Melena[ ] ; Hematochezia [ ] ; Heartburn[  ]; Abdominal pain [ ] ; Constipation [ ] ; Diarrhea [ ] ; BRBPR [ ]   GU: Hematuria[ ] ; Dysuria [ ] ; Nocturia[ ]   Vascular: Pain in legs with walking [ ] ; Pain in feet with lying flat [ ] ; Non-healing sores [ ] ; Stroke [ ] ; TIA [ ] ; Slurred speech [ ] ;  Neuro: Headaches[ ] ; Vertigo[ ] ; Seizures[ ] ; Paresthesias[ ] ;Blurred vision [ ] ; Diplopia [ ] ; Vision changes [ ]   Ortho/Skin: Arthritis [ ] ; Joint pain [ Y]; Muscle pain [ ] ; Joint swelling [ ] ; Back Pain [ ] ; Rash [ ]   Psych: Depression[ ] ; Anxiety[ ]   Heme: Bleeding problems [Y ]; Clotting disorders [ ] ; Anemia [ ]   Endocrine: Diabetes [ ] ; Thyroid dysfunction[ ]   Home Medications Prior to Admission medications   Medication Sig Start Date End Date Taking? Authorizing Provider  albuterol (PROVENTIL) (2.5 MG/3ML) 0.083% nebulizer solution Take 2.5 mg by nebulization every 6 (six) hours as needed for wheezing or shortness of breath.     Historical Provider, MD  budesonide-formoterol (SYMBICORT) 160-4.5 MCG/ACT inhaler Inhale 2 puffs into the lungs 2 (two) times daily.    Historical Provider, MD  cholecalciferol (VITAMIN D) 1000 UNITS tablet Take 1,000 Units by mouth daily. Take 2 capsules hs    Historical Provider, MD  citalopram (CELEXA) 40 MG tablet Take 40 mg by mouth at bedtime.    Historical Provider, MD  cyanocobalamin 1000 MCG tablet  Take 1,000 mcg by mouth at bedtime.     Historical Provider, MD  docusate sodium (COLACE) 100 MG capsule Take 200 mg by mouth at bedtime.     Historical Provider, MD  enoxaparin (LOVENOX) 100 MG/ML injection Inject 1 mL (100 mg total) into the skin every 12 (twelve) hours. 01/25/14   Jolaine Artist, MD  ferrous gluconate (FERGON) 324 MG tablet Take 324 mg by mouth 3 (three) times daily with meals.    Historical Provider, MD  furosemide (LASIX) 40 MG tablet Take 40 mg by mouth as needed. Take 40 mg every Monday, Wednesday, and Friday. Take with potassium. Hold if weight less than 220 lbs.  May take extra 20 mg  if weight above 225 lbs 09/14/13   Amy D Clegg, NP  levothyroxine (SYNTHROID, LEVOTHROID) 100 MCG tablet Take 50 mcg by mouth at bedtime.     Historical Provider, MD  metoprolol succinate (TOPROL-XL) 25 MG 24 hr tablet Take 12.5-25 mg by mouth 2 (two) times daily. 1/2 tab am and 1 tab pm    Historical Provider, MD  nitroGLYCERIN (NITROSTAT) 0.4 MG SL tablet Place 0.4 mg under the tongue every 5 (five) minutes as needed.      Historical Provider, MD  pantoprazole (PROTONIX) 40 MG tablet Take 40 mg by mouth at bedtime.    Historical Provider, MD  potassium chloride SA (K-DUR,KLOR-CON) 20 MEQ tablet Take 20 mEq by mouth as needed. Take 20 meq every Monday, Wednesday, and Friday with Lasix Take extra K-Dur with any extra Lasix doses 09/14/13   Amy D Clegg, NP  simvastatin (ZOCOR) 80 MG tablet Take 40 mg by mouth at bedtime.      Historical Provider, MD  tiotropium (SPIRIVA) 18 MCG inhalation capsule Place 18 mcg into inhaler and inhale daily.     Historical Provider, MD  vitamin C (ASCORBIC ACID) 500 MG tablet Take 500 mg by mouth 2 (two) times daily.     Historical Provider, MD  warfarin (COUMADIN) 5 MG tablet Take 5 mg by mouth daily.    Historical Provider, MD    Past Medical History: Past Medical History  Diagnosis Date  . Ischemic cardiomyopathy      CABG 2003, PCI 2007  EF 27%(myoview 2012  . Chronic systolic heart failure   . Intestine disorder     blockage  . Hyperlipidemia   . Hypothyroidism   . Chronic anticoagulation     Afib and LVAD  . Obesity   . COPD (chronic obstructive pulmonary disease)   . Asbestosis(501)     "6 years in the [place]" (05/24/2013)  . Atrial fibrillation     permanent  . Paroxysmal ventricular tachycardia   . Coronary artery disease   . Implantable cardioverter-defibrillator Medtronic   . Hypertension   . Myocardial infarction 1990's-2000    "2 in  ~ the 1990's; 1 in ~ 2000" (05/24/2013)  . OSA on CPAP   . Depression   . LVAD (left ventricular assist  device) present     Past Surgical History: Past Surgical History  Procedure Laterality Date  . Coronary artery bypass graft  2003    "?X4" (05/24/2013)  . Cardiac defibrillator placement  2004; ~ 2010  . Insertion of implantable left ventricular assist device N/A 01/12/2013    Procedure: INSERTION OF IMPLANTABLE LEFT VENTRICULAR ASSIST DEVICE;  Surgeon: Ivin Poot, MD;  Location: [place];  Service: Open Heart Surgery;  Laterality: N/A;   nitric oxide; Redo sternotomy  .  Intraoperative transesophageal echocardiogram N/A 01/12/2013    Procedure: INTRAOPERATIVE TRANSESOPHAGEAL ECHOCARDIOGRAM;  Surgeon: Ivin Poot, MD;  Location: [place];  Service: Open Heart Surgery;  Laterality: N/A;    Family History: Family History  Problem Relation Age of Onset  . Heart attack Mother   . Heart attack Father     Social History: History   Social History  . Marital Status: Married    Spouse Name: N/A    Number of Children: N/A  . Years of Education: N/A   Social History Main Topics  . Smoking status: Former Smoker -- 2.00 packs/day for 45 years    Types: Cigarettes    Quit date: 11/11/2001  . Smokeless tobacco: Never Used  . Alcohol Use: No     Comment: 05/24/2013 "use to drink beer; hardly nothing since 2003; once in awhile a beer "  . Drug Use: No  . Sexual Activity: No   Other Topics Concern  . None   Social History Narrative  . None    Allergies:  No Known Allergies  Objective:    Vital Signs:   Temp:  [97.6 F (36.4 C)] 97.6 F (36.4 C) (06/08 1515) BP: (86)/(0) 86/0 mmHg (06/08 1515) Weight:  [97.2 kg (214 lb 4.6 oz)] 97.2 kg (214 lb 4.6 oz) (06/08 1515) Last BM Date: 03/07/14 Filed Weights   03/07/14 1515  Weight: 97.2 kg (214 lb 4.6 oz)    Mean arterial Pressure MAP 86  Physical Exam: General:  Well appearing. No resp difficulty HEENT: normal Neck: supple. JVP flat . Carotids 2+ bilat; no bruits. No lymphadenopathy or thryomegaly appreciated. Cor:  Mechanical heart sounds with LVAD hum present. Lungs: clear Abdomen: soft, nontender, nondistended. No hepatosplenomegaly. No bruits or masses. Good bowel sounds. Driveline: C/D/I; securement device intact and driveline incorporated Extremities: no cyanosis, clubbing, rash, edema Neuro: alert & orientedx3, cranial nerves grossly intact. moves all 4 extremities w/o difficulty. Affect pleasant  Telemetry: SR 50-60s  Labs: Basic Metabolic Panel:  Recent Labs Lab 03/03/14 0947 03/07/14 1641  NA 141 142  K 4.4 4.1  CL 107 106  CO2 23 26  GLUCOSE 110* 104*  BUN 13 13  CREATININE 0.79 0.80  CALCIUM 9.3 9.7    Liver Function Tests:  Recent Labs Lab 03/07/14 1641  AST 24  ALT 12  ALKPHOS 55  BILITOT 0.5  PROT 5.7*  ALBUMIN 3.1*   No results found for this basename: LIPASE, AMYLASE,  in the last 168 hours No results found for this basename: AMMONIA,  in the last 168 hours  CBC:  Recent Labs Lab 03/03/14 0947 03/07/14 1641  WBC 6.3 6.3  NEUTROABS  --  4.8  HGB 7.6* 8.8*  HCT 26.1* 30.7*  MCV 110.1* 108.1*  PLT 136* 135*    Cardiac Enzymes: No results found for this basename: CKTOTAL, CKMB, CKMBINDEX, TROPONINI,  in the last 168 hours  BNP: BNP (last 3 results)  Recent Labs  09/13/13 1422 11/02/13 1422 01/04/14 0922  PROBNP 1465.0* 959.3* 595.8*    CBG: No results found for this basename: GLUCAP,  in the last 168 hours  Coagulation Studies:  Recent Labs  03/07/14 1641  LABPROT 16.7*  INR 1.39     Imaging: No results found.      Assessment:   1) Anemia 2) ?GIB 3) Chronic systolic heart failure - s/p LVAD 12/2012 4) PAF 5) Depression  Plan/Discussion:    Mr. Ruffins presents today for bridge with heparin for his LVAD  while his INR trends down in order for him to undergo upper/lower endoscopy. He has needed multiple transfusions in the past month and suspect that he has AVMs. No ASA and hold coumadin.   I reviewed the LVAD  parameters from today, and compared the results to the patient's prior recorded data.  No programming changes were made.  The LVAD is functioning within specified parameters.  The patient performs LVAD self-test daily.  LVAD interrogation was negative for any significant power changes, alarms or PI events/speed drops.  LVAD equipment check completed and is in good working order.  Back-up equipment present.   LVAD education done on emergency procedures and precautions and reviewed exit site care.  Length of Stay: 0  Junie Bame, NP-C 03/07/2014, 6:33 PM  VAD Team Pager [phone] (7am - 7am) +++VAD ISSUES ONLY+++   Advanced Heart Failure Team Pager [phone] (M-F; [place])  Please contact [facility] for night-coverage after hours (4p -7a ) and weekends on [email or web address] for all non- LVAD Issues  Patient seen and examined with Junie Bame, NP. We discussed all aspects of the encounter. I agree with the assessment and plan as stated above. He is admitted for heparin bridging prior toe EGD/colonscopy in setting of presumed GI bleed and presence of LVAD. INR 1.3. Will start heparin. Contact GI tomorrow am. HF stable. VAD interrogated personally. No PI events or alarms.   Shaune Pascal Maclean Foister,MD 6:35 PM

## 2014-03-07 NOTE — Progress Notes (Signed)
ANTICOAGULATION CONSULT NOTE - Initial Consult  Pharmacy Consult for Heparin Indication: Patient has LVAD and needs therapeutic IV anticoagulation, bridge for EGD/Colonoscopy  No Known Allergies  Patient Measurements: Height: 6' (182.9 cm) Weight: 214 lb 4.6 oz (97.2 kg) IBW/kg (Calculated) : 77.6 Heparin Dosing Weight: 97 kg  Vital Signs: Temp: 97.6 F (36.4 C) (06/08 1515) Temp src: Oral (06/08 1515) BP: 86/0 mmHg (06/08 1515)  Labs:  Recent Labs  03/07/14 1641  HGB 8.8*  HCT 30.7*  PLT 135*  LABPROT 16.7*  INR 1.39  CREATININE 0.80    Estimated Creatinine Clearance: 100.8 ml/min (by C-G formula based on Cr of 0.8).   Medical History: Past Medical History  Diagnosis Date  . Ischemic cardiomyopathy      CABG 2003, PCI 2007  EF 27%(myoview 2012  . Chronic systolic heart failure   . Intestine disorder     blockage  . Hyperlipidemia   . Hypothyroidism   . Chronic anticoagulation     Afib and LVAD  . Obesity   . COPD (chronic obstructive pulmonary disease)   . Asbestosis(501)     "6 years in the [place]" (05/24/2013)  . Atrial fibrillation     permanent  . Paroxysmal ventricular tachycardia   . Coronary artery disease   . Implantable cardioverter-defibrillator Medtronic   . Hypertension   . Myocardial infarction 1990's-2000    "2 in  ~ the 1990's; 1 in ~ 2000" (05/24/2013)  . OSA on CPAP   . Depression   . LVAD (left ventricular assist device) present     Assessment: [name] s/p LVAD implantation on chronic coumadin. He is admitted for suspected GIB, will need EGD/ colonoscopy. Coumadin has been stopped since 6/6. INR 1.39 today. Pharmacy is consulted to start IV heparin Hgb 8.8, plt 135 k. He was on heparin during previous admission in April, and required 1850 units/hr to get heparin level therapeutic.   Goal of Therapy:  Heparin level 0.3-0.7 units/ml Monitor platelets by anticoagulation protocol: Yes   Plan:  - Heparin infusion 1700 units/hr with no  bolus - f/u 6 hr heparin level at 0100 - Daily heparin level and CBC - Monitor s/sx of bleeding closely.  Maryanna Shape, PharmD, BCPS  Clinical Pharmacist  Pager: [phone]   03/07/2014,6:54 PM

## 2014-03-07 NOTE — Progress Notes (Signed)
Utilization Review Completed.Ryan Laming T Dowell6/04/2014

## 2014-03-08 ENCOUNTER — Encounter (HOSPITAL_COMMUNITY): Payer: Medicare Other

## 2014-03-08 DIAGNOSIS — K922 Gastrointestinal hemorrhage, unspecified: Secondary | ICD-10-CM

## 2014-03-08 DIAGNOSIS — Z95811 Presence of heart assist device: Secondary | ICD-10-CM | POA: Diagnosis not present

## 2014-03-08 DIAGNOSIS — I5022 Chronic systolic (congestive) heart failure: Secondary | ICD-10-CM | POA: Diagnosis not present

## 2014-03-08 LAB — BASIC METABOLIC PANEL
BUN: 12 mg/dL (ref 6–23)
CALCIUM: 9.3 mg/dL (ref 8.4–10.5)
CO2: 26 mEq/L (ref 19–32)
Chloride: 106 mEq/L (ref 96–112)
Creatinine, Ser: 0.79 mg/dL (ref 0.50–1.35)
GFR, EST NON AFRICAN AMERICAN: 88 mL/min — AB (ref >90)
GLUCOSE: 124 mg/dL — AB (ref 70–99)
Potassium: 4 mEq/L (ref 3.7–5.3)
SODIUM: 141 meq/L (ref 137–147)

## 2014-03-08 LAB — CBC
HCT: 30.1 % — ABNORMAL LOW (ref 39.0–52.0)
Hemoglobin: 8.7 g/dL — ABNORMAL LOW (ref 13.0–17.0)
MCH: 31.4 pg (ref 26.0–34.0)
MCHC: 28.9 g/dL — ABNORMAL LOW (ref 30.0–36.0)
MCV: 108.7 fL — ABNORMAL HIGH (ref 78.0–100.0)
Platelets: 137 10*3/uL — ABNORMAL LOW (ref 150–400)
RBC: 2.77 MIL/uL — AB (ref 4.22–5.81)
RDW: 19 % — ABNORMAL HIGH (ref 11.5–15.5)
WBC: 7 10*3/uL (ref 4.0–10.5)

## 2014-03-08 LAB — HEPARIN LEVEL (UNFRACTIONATED)
HEPARIN UNFRACTIONATED: 0.85 [IU]/mL — AB (ref 0.30–0.70)
Heparin Unfractionated: 0.25 IU/mL — ABNORMAL LOW (ref 0.30–0.70)

## 2014-03-08 MED ORDER — SODIUM CHLORIDE 0.9 % IV SOLN
INTRAVENOUS | Status: DC
  Administered 2014-03-08: 20:00:00 via INTRAVENOUS

## 2014-03-08 MED ORDER — METOPROLOL SUCCINATE 12.5 MG HALF TABLET
12.5000 mg | ORAL_TABLET | Freq: Two times a day (BID) | ORAL | Status: DC
  Administered 2014-03-08: 12.5 mg via ORAL
  Filled 2014-03-08 (×4): qty 1

## 2014-03-08 MED ORDER — PEG-KCL-NACL-NASULF-NA ASC-C 100 G PO SOLR: 1.0000 | Freq: Once | ORAL | Status: DC

## 2014-03-08 MED ORDER — PEG-KCL-NACL-NASULF-NA ASC-C 100 G PO SOLR
0.5000 | Freq: Once | ORAL | Status: AC
  Administered 2014-03-09: 100 g via ORAL
  Filled 2014-03-08: qty 1

## 2014-03-08 MED ORDER — PANTOPRAZOLE SODIUM 40 MG PO TBEC
40.0000 mg | DELAYED_RELEASE_TABLET | Freq: Every day | ORAL | Status: DC
  Administered 2014-03-09 – 2014-03-11 (×3): 40 mg via ORAL
  Filled 2014-03-08 (×2): qty 1

## 2014-03-08 MED ORDER — BISACODYL 5 MG PO TBEC
5.0000 mg | DELAYED_RELEASE_TABLET | Freq: Three times a day (TID) | ORAL | Status: AC
  Administered 2014-03-08 (×2): 5 mg via ORAL
  Filled 2014-03-08 (×2): qty 1

## 2014-03-08 MED ORDER — PEG-KCL-NACL-NASULF-NA ASC-C 100 G PO SOLR
0.5000 | Freq: Once | ORAL | Status: AC
  Administered 2014-03-08: 100 g via ORAL
  Filled 2014-03-08: qty 1

## 2014-03-08 NOTE — Progress Notes (Signed)
ANTICOAGULATION CONSULT NOTE follow up Pharmacy Consult for Heparin Indication: Patient has LVAD and needs therapeutic IV anticoagulation, bridge for EGD/Colonoscopy  No Known Allergies  Patient Measurements: Height: 6' (182.9 cm) Weight: 214 lb 4.6 oz (97.2 kg) IBW/kg (Calculated) : 77.6 Heparin Dosing Weight: 97 kg  Vital Signs: Temp: 98 F (36.7 C) (06/08 2012) Temp src: Oral (06/08 2012) BP: 84/0 mmHg (06/08 2012) Pulse Rate: 59 (06/08 2012)  Labs:  Recent Labs  03/07/14 1641 03/08/14 0106  HGB 8.8* 8.7*  HCT 30.7* 30.1*  PLT 135* 137*  LABPROT 16.7*  --   INR 1.39  --   HEPARINUNFRC  --  0.25*  CREATININE 0.80 0.79    Estimated Creatinine Clearance: 100.8 ml/min (by C-G formula based on Cr of 0.79).   Assessment: [name] s/p LVAD implantation on chronic coumadin. He is admitted for suspected GIB, will need EGD/ colonoscopy. Coumadin has been stopped since 6/6. INR 1.39 today. Pharmacy is consulted to start IV heparin Hgb 8.8, plt 135 k. He was on heparin during previous admission in April, and required 1850 units/hr to get heparin level therapeutic.  First HL after heparin started at 1700 units/hr with no bolus is little low at 0.25.  Heparin infusing well per RN with no problems. CBC low but stable 8.7/30.1 and pltc 137.  Goal of Therapy:  Heparin level 0.3-0.7 units/ml Monitor platelets by anticoagulation protocol: Yes   Plan:  - increase Heparin infusion to 1850 units/hr  - f/u 8 hr heparin level  - Daily heparin level and CBC - Monitor s/sx of bleeding closely.  [name], Pharm.D. [phone] 03/08/2014 5:36 AM

## 2014-03-08 NOTE — Progress Notes (Signed)
ANTICOAGULATION CONSULT NOTE - Follow Up Consult  Pharmacy Consult for Heparin Indication: LVAD  No Known Allergies  Patient Measurements: Height: 6' (182.9 cm) Weight: 215 lb 13.3 oz (97.9 kg) IBW/kg (Calculated) : 77.6 Heparin Dosing Weight: 97 kg  Vital Signs: Temp: 98.1 F (36.7 C) (06/09 1638) Temp src: Oral (06/09 1638) Pulse Rate: 60 (06/09 1638)  Labs:  Recent Labs  03/07/14 1641 03/08/14 0106 03/08/14 1453  HGB 8.8* 8.7*  --   HCT 30.7* 30.1*  --   PLT 135* 137*  --   LABPROT 16.7*  --   --   INR 1.39  --   --   HEPARINUNFRC  --  0.25* 0.85*  CREATININE 0.80 0.79  --     Estimated Creatinine Clearance: 101.2 ml/min (by C-G formula based on Cr of 0.79).  Assessment: 74 YOM s/p LVAD (2014) implantation on chronic coumadin. He is admitted for suspected GIB, will need EGD/ colonoscopy. Planned to keep heparin level at lower end of goal range but level 0.25>>0.85 (last increase was 1700>>1850 units/hr). RN reports pt has had nose bleed this afternoon.  Goal of Therapy:  Heparin level 0.3-0.5 Monitor platelets by anticoagulation protocol: Yes   Plan:  Decrease IV heparin to 1750 units/hr Recheck level in 6-8 hrs.   Malkia Nippert S. Alford Highland, PharmD, [facility] Clinical Staff Pharmacist Pager [phone]  Wayland Salinas 03/08/2014,5:17 PM

## 2014-03-08 NOTE — Progress Notes (Signed)
CARDIAC REHAB PHASE I   PRE:  Rate/Rhythm: 64 SR  BP:  Supine:   Sitting: 86 Dopplered  Standing:    SaO2: 95 RA  MODE:  Ambulation: 890 ft   POST:  Rate/Rhythm: 66  BP:  Supine:   Sitting: 92 Dopplered  Standing:    SaO2: 91 RA 1335-1435 Assisted X 1 to ambulate. Gait steady with hand held assist. Pt able to walk 890 feet with two standing rest stops. VS stable. Pt c/o of legs getting tired with walking. To recliner after walk with call light in reach.  Rodney Langton RN 03/08/2014 2:34 PM

## 2014-03-08 NOTE — Progress Notes (Signed)
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 Quit smoking 2003. Uses CPAP and O2 every night. He is s/p LVAD HM II implanted 01/12/13 under DT.  He has needed to come to short stay 2 times in the past month for transfusions. He has needed a total of 4PRBC last. He received his last 2 PRBC last week and ASA was stopped. Admitted yesterday for bridge with heparin for EGD/colonoscopy.  Denies SOB, orthopnea or CP. On heparin gtt. Hgb stable 8.7. INR 1.39. GI has seen this am and will have procedure tomorrow.    LVAD INTERROGATION:  HeartMate II LVAD:  Flow 5.6 liters/min, speed 9200, power 6.0, PI 5.8.  No PI events  Objective:    Vital Signs:   Temp:  [97.6 F (36.4 C)-98 F (36.7 C)] 98 F (36.7 C) (06/09 0555) Pulse Rate:  [58-59] 58 (06/09 0543) Resp:  [18] 18 (06/09 0555) BP: (84-86)/(0) 84/0 mmHg (06/08 2012) SpO2:  [95 %-97 %] 95 % (06/09 0555) Weight:  [214 lb 4.6 oz (97.2 kg)-215 lb 13.3 oz (97.9 kg)] 215 lb 13.3 oz (97.9 kg) (06/09 0555) Last BM Date: 03/07/14 Mean arterial Pressure  84-88  Intake/Output:   Intake/Output Summary (Last 24 hours) at 03/08/14 0752 Last data filed at 03/08/14 0600  Gross per 24 hour  Intake      0 ml  Output   1025 ml  Net  -1025 ml     Physical Exam: General: Well appearing. No resp difficulty  HEENT: normal  Neck: supple. JVP flat . Carotids 2+ bilat; no bruits. No lymphadenopathy or thryomegaly appreciated.  Cor: Mechanical heart sounds with LVAD hum present.  Lungs: clear  Abdomen: soft, nontender, nondistended. No hepatosplenomegaly. No bruits or masses. Good bowel sounds.  Driveline: C/D/I; securement device intact and driveline incorporated  Extremities: no cyanosis, clubbing, rash, edema  Neuro: alert & orientedx3, cranial nerves grossly intact. moves all 4  extremities w/o difficulty. Affect pleasant  Telemetry: SR 50-60s   Labs: Basic Metabolic Panel:  Recent Labs Lab 03/03/14 0947 03/07/14 1641 03/08/14 0106  NA 141 142 141  K 4.4 4.1 4.0  CL 107 106 106  CO2 23 26 26   GLUCOSE 110* 104* 124*  BUN 13 13 12   CREATININE 0.79 0.80 0.79  CALCIUM 9.3 9.7 9.3    Liver Function Tests:  Recent Labs Lab 03/07/14 1641  AST 24  ALT 12  ALKPHOS 55  BILITOT 0.5  PROT 5.7*  ALBUMIN 3.1*   No results found for this basename: LIPASE, AMYLASE,  in the last 168 hours No results found for this basename: AMMONIA,  in the last 168 hours  CBC:  Recent Labs Lab 03/03/14 0947 03/07/14 1641 03/08/14 0106  WBC 6.3 6.3 7.0  NEUTROABS  --  4.8  --   HGB 7.6* 8.8* 8.7*  HCT 26.1* 30.7* 30.1*  MCV 110.1* 108.1* 108.7*  PLT 136* 135* 137*    INR:  Recent Labs Lab 03/03/14 0947 03/07/14 1641  INR 2.96* 1.39    Other results:  EKG:   Imaging:  No results found.   Medications:     Scheduled Medications: . budesonide-formoterol  2 puff Inhalation BID  . citalopram  40 mg Oral Daily  . docusate sodium  200 mg Oral Daily  . ferrous gluconate  324 mg Oral TID WC  . [  START ON 03/09/2014] levothyroxine  50 mcg Oral QAC breakfast  . metoprolol succinate  25 mg Oral BID  . pantoprazole  40 mg Oral BID  . simvastatin  40 mg Oral q1800  . tiotropium  18 mcg Inhalation Daily     Infusions: . heparin 1,850 Units/hr (03/08/14 0745)     PRN Medications:  acetaminophen, acetaminophen   Assessment:   1) Anemia  2) ?GIB  3) Chronic systolic heart failure  - s/p LVAD 12/2012  4) PAF  5) Depression  Plan/Discussion:    Mr. Skillin was admitted yesterday for bridge with heparin for his LVAD in order for him to undergo upper/lower endoscopy. He has needed multiple transfusions in the past month and suspect that he has AVMs. No ASA and coumadin on hold.   INR 1.3 and Hgb stable. Will contact GI today to see if they  can take patient today he has been NPO since midnight. SB 50-60s will cut Toprol back to 12.5 mg BID. MAPs controlled currently but if rise could consider losartan 12.5 mg daily. Has not tolerated lisinopril in the past d.t dizziness.   I reviewed the LVAD parameters from today, and compared the results to the patient's prior recorded data.  No programming changes were made.  The LVAD is functioning within specified parameters.  The patient performs LVAD self-test daily.  LVAD interrogation was negative for any significant power changes, alarms or PI events/speed drops.  LVAD equipment check completed and is in good working order.  Back-up equipment present.   LVAD education done on emergency procedures and precautions and reviewed exit site care.  Length of Stay: 1  Rande Brunt 03/08/2014, 7:52 AM  VAD Team Pager [phone] (7am - 7am)  Patient seen and examined with Junie Bame, NP. We discussed all aspects of the encounter. I agree with the assessment and plan as stated above.   Currently stable. GI has seen for endoscopy tomorrow. Continue heparin. Will discuss with GI and anesthesia to coordinate procedure for tomorrow and assess need for arterial line placement to monitor MAPs closely during colonscopy and push enteroscopy.   VAD parameters are stable.   Daniel R Bensimhon,MD 10:05 AM

## 2014-03-08 NOTE — Consult Note (Addendum)
[facility] Consult: 9:13 AM 03/08/2014  LOS: 1 day    Referring Provider: Bensimhon.   Primary Care Physician:  Thressa Sheller, MD Primary Gastroenterologist:  None.     Reason for Consultation:  Anemia and FOBT +   HPI: [name] is a 73 y.o. male.  Hx CAD.  CABG 2003. Ischemic CM, s/p ICD with  LVAD placed 01/12/13 by Dr Prescott Gum.  Chronic anticoagulation with Coumadin for many years.  DC cardioversion of A fib 2012.  Ex lap with bowel resection for "twisted bowel" in 1969.  Unknown if this involved colon or SB. Hx colon polyps remotely.   Placed on po Iron and po B12 after LVAD.  Hx of chronic macrocytic anemia going back to 04/2013 (FOBT positive then) but not transfused with red blood until 08/2013.  Hgb as low as 7.6 on 6/4.  Got 2 units red blood and IV iron infusion on 5/14, 2 more units red blood on 6/4.  Hgb now 8.7.  MCV is increased.  Platelets are low.  Has seen some red blood from rectum but not large amounts. Stools generally hard, formed, black due to po Iron added in the last several weeks.  Has not beeen FOBT tested since it was negative in 08/2013. Excellent appetite.  Wt down about 15 # since surgery, breathing and stamina much improved overall.  No nausea, no dysphagia, no heartburn.  No abd pain. No ETOH.  Colonoscopy with removal of polyps > 10 yrs ago.  No pathology records and pt unaware of MD (white male with a common last name he thinks was [name]).  Says he had anemia then and underwent EGD as well as colonoscopy.  Not aware if he had transfusions before 2015.     Past Medical History  Diagnosis Date  . Ischemic cardiomyopathy      CABG 2003, PCI 2007  EF 27%(myoview 2012  . Chronic systolic heart failure   . Intestine disorder     blockage  . Hyperlipidemia   .  Hypothyroidism   . Chronic anticoagulation     Afib and LVAD  . Obesity   . COPD (chronic obstructive pulmonary disease)   . Asbestosis(501)     "6 years in the [place]" (05/24/2013)  . Atrial fibrillation     permanent  . Paroxysmal ventricular tachycardia   . Coronary artery disease   . Implantable cardioverter-defibrillator [employer]   . Hypertension   . Myocardial infarction 1990's-2000    "2 in  ~ the 1990's; 1 in ~ 2000" (05/24/2013)  . OSA on CPAP   . Depression   . LVAD (left ventricular assist device) present     Past Surgical History  Procedure Laterality Date  . Coronary artery bypass graft  2003    "?X4" (05/24/2013)  . Cardiac defibrillator placement  2004; ~ 2010  . Insertion of implantable left ventricular assist device N/A 01/12/2013    Procedure: INSERTION OF IMPLANTABLE LEFT VENTRICULAR ASSIST DEVICE;  Surgeon: Ivin Poot, MD;  Location: [facility];  Service: Open Heart Surgery;  Laterality: N/A;   nitric oxide; Redo sternotomy  . Intraoperative transesophageal echocardiogram N/A 01/12/2013    Procedure: INTRAOPERATIVE TRANSESOPHAGEAL ECHOCARDIOGRAM;  Surgeon: Ivin Poot, MD;  Location: [facility];  Service: Open Heart Surgery;  Laterality: N/A;    Prior to Admission medications   Medication Sig Start Date End Date Taking? Authorizing Provider  albuterol (PROVENTIL) (2.5 MG/3ML) 0.083% nebulizer solution Take 2.5 mg by nebulization every 6 (six) hours as needed for wheezing or shortness of breath.    Yes Historical Provider, MD  budesonide-formoterol (SYMBICORT) 160-4.5 MCG/ACT inhaler Inhale 2 puffs into the lungs 2 (two) times daily.   Yes Historical Provider, MD  cholecalciferol (VITAMIN D) 1000 UNITS tablet Take 2,000 Units by mouth at bedtime. Take 2 capsules hs   Yes Historical Provider, MD  citalopram (CELEXA) 40 MG tablet Take 40 mg by mouth at bedtime.   Yes Historical Provider, MD  cyanocobalamin 1000 MCG tablet Take 1,000 mcg by mouth at bedtime.    Yes  Historical Provider, MD  docusate sodium (COLACE) 100 MG capsule Take 200 mg by mouth at bedtime.    Yes Historical Provider, MD  ferrous gluconate (FERGON) 324 MG tablet Take 324 mg by mouth 3 (three) times daily with meals.   Yes Historical Provider, MD  furosemide (LASIX) 40 MG tablet Take 40 mg by mouth as needed. Take 40 mg every Monday, Wednesday, and Friday. Take with potassium. Hold if weight less than 220 lbs.  May take extra 20 mg if weight above 225 lbs 09/14/13  Yes Amy D Clegg, NP  levothyroxine (SYNTHROID, LEVOTHROID) 100 MCG tablet Take 50 mcg by mouth at bedtime.    Yes Historical Provider, MD  metoprolol succinate (TOPROL-XL) 25 MG 24 hr tablet Take 25-37.5 mg by mouth 2 (two) times daily. Take 25 mg every morning and 37.5 mg every evening   Yes Historical Provider, MD  nitroGLYCERIN (NITROSTAT) 0.4 MG SL tablet Place 0.4 mg under the tongue every 5 (five) minutes as needed.     Yes Historical Provider, MD  pantoprazole (PROTONIX) 40 MG tablet Take 40 mg by mouth every morning.    Yes Historical Provider, MD  potassium chloride SA (K-DUR,KLOR-CON) 20 MEQ tablet Take 20 mEq by mouth as needed. Take 20 meq every Monday, Wednesday, and Friday with Lasix Take extra K-Dur with any extra Lasix doses 09/14/13  Yes Amy D Clegg, NP  simvastatin (ZOCOR) 80 MG tablet Take 40 mg by mouth at bedtime.     Yes Historical Provider, MD  tiotropium (SPIRIVA) 18 MCG inhalation capsule Place 18 mcg into inhaler and inhale daily.    Yes Historical Provider, MD  vitamin C (ASCORBIC ACID) 500 MG tablet Take 500 mg by mouth 2 (two) times daily.    Yes Historical Provider, MD    Scheduled Meds: . budesonide-formoterol  2 puff Inhalation BID  . citalopram  40 mg Oral Daily  . docusate sodium  200 mg Oral Daily  . ferrous gluconate  324 mg Oral TID WC  . [START ON 03/09/2014] levothyroxine  50 mcg Oral QAC breakfast  . metoprolol succinate  12.5 mg Oral BID  . pantoprazole  40 mg Oral BID  . simvastatin   40 mg Oral q1800  . tiotropium  18 mcg Inhalation Daily   Infusions: . heparin 1,850 Units/hr (03/08/14 0745)   PRN Meds: acetaminophen, acetaminophen   Allergies as of 03/04/2014  . (No Known Allergies)    Family History  Problem Relation Age of Onset  .  Heart attack Mother   . Heart attack Father     History   Social History  . Marital Status: Married    Spouse Name: N/A    Number of Children: N/A  . Years of Education: N/A   Occupational History  . Not on file.   Social History Main Topics  . Smoking status: Former Smoker -- 2.00 packs/day for 45 years    Types: Cigarettes    Quit date: 11/11/2001  . Smokeless tobacco: Never Used  . Alcohol Use: No     Comment: 05/24/2013 "use to drink beer; hardly nothing since 2003; once in awhile a beer "  . Drug Use: No  . Sexual Activity: No   Other Topics Concern  . Not on file   Social History Narrative  . No narrative on file    REVIEW OF SYSTEMS: Constitutional:  Improved stamina ENT:  No nose bleeds Pulm:  Dyspnea improved, no cough CV:  No palpitations, no LE edema.  GU:  No hematuria, no frequency GI:  Per HPI Heme:  See HPI   Transfusions:  See HPI Neuro:  No headaches, no peripheral tingling or numbness Derm:  No itching, no rash or sores.  Endocrine:  No sweats or chills.  No polyuria or dysuria Immunization:  Not queried.  Travel:  None beyond local counties in last few months.    PHYSICAL EXAM: Vital signs in last 24 hours: Filed Vitals:   03/08/14 0555  BP:   Pulse:   Temp: 98 F (36.7 C)  Resp: 18   Wt Readings from Last 3 Encounters:  03/08/14 97.9 kg (215 lb 13.3 oz)  03/03/14 103.964 kg (229 lb 3.2 oz)  02/03/14 101.492 kg (223 lb 12 oz)   General: overweight, somewhat ill looking WM who is comfortable Head:  No swelling or asymmetry  Eyes:  No icterus or pallor Ears:  HOH  Nose:  No discharge or sneezing Mouth:  Moist, pink oral mm.  Vascular ectasia on underside of  tongue Neck:  No mass, no JVD Lungs:  Clear bil.  No dyspnea.  BS reduced overall.  Heart: machine hum Abdomen:  Soft, LVAD line in place, hypoactive BS.  NT.  No mass or HSM.   Rectal: deferred   Musc/Skeltl: no contractures or swelling Extremities:  No CCE  Neurologic:  Oriented x 3.  Slightly HOH Skin:  Chronic sun damage; telangiectasia Tattoos:  On arms.  Nodes:  No cervical adenopathy.    Psych:  Pleasant, cooperative, relaxed.   Intake/Output from previous day: 06/08 0701 - 06/09 0700 In: -  Out: 1025 [Urine:1025] Intake/Output this shift:    LAB RESULTS:  Recent Labs  03/07/14 1641 03/08/14 0106  WBC 6.3 7.0  HGB 8.8* 8.7*  HCT 30.7* 30.1*  PLT 135* 137*   BMET Lab Results  Component Value Date   NA 141 03/08/2014   NA 142 03/07/2014   NA 141 03/03/2014   K 4.0 03/08/2014   K 4.1 03/07/2014   K 4.4 03/03/2014   CL 106 03/08/2014   CL 106 03/07/2014   CL 107 03/03/2014   CO2 26 03/08/2014   CO2 26 03/07/2014   CO2 23 03/03/2014   GLUCOSE 124* 03/08/2014   GLUCOSE 104* 03/07/2014   GLUCOSE 110* 03/03/2014   BUN 12 03/08/2014   BUN 13 03/07/2014   BUN 13 03/03/2014   CREATININE 0.79 03/08/2014   CREATININE 0.80 03/07/2014   CREATININE 0.79 03/03/2014   CALCIUM 9.3 03/08/2014  CALCIUM 9.7 03/07/2014   CALCIUM 9.3 03/03/2014   LFT  Recent Labs  03/07/14 1641  PROT 5.7*  ALBUMIN 3.1*  AST 24  ALT 12  ALKPHOS 55  BILITOT 0.5   PT/INR Lab Results  Component Value Date   INR 1.39 03/07/2014   INR 2.96* 03/03/2014   INR 2.8* 02/23/2014    Ref. Range 02/09/2014 13:00  Iron Latest Range: 42-135 ug/dL 49  UIBC Latest Range: 125-400 ug/dL 335  TIBC Latest Range: 215-435 ug/dL 384  Saturation Ratios Latest Range: 20-55 % 13 (L)   TSH   4.7 on 03/07/14  RADIOLOGY STUDIES: No results found.  ENDOSCOPIC STUDIES: None for at least 10 years.   IMPRESSION:   *  Chronic macrocytic anemia, transfusion requiring.  IV iron given about 4 weeks ago. Wonder if he has malabsorption problem? Has  had some rectal bleeding but this has not been severe, he attributes this to hemorrhoids.   *  Thrombocytopenia.  *  12/2013 LVAD for end stage ischemic CM.  *  Many years of chronic Coumadin for hx a fib, and now for the LVAD status. On hold for last 2 days, on Heparin window currently.  *  Hx intestinal blockage 1969, s/p ex lap and removal of portion of bowel(SB vs colon ??).  No problems since then.   *  Pt reported hx colon polyps.  No pathology or endoscopic records found. > 10 yrs ago.     PLAN:     *  Colonoscopy and EGD, tomorrow around 1300.  See orders.   Stop po iron during prep. *  Will stop the IV Heparin at 0930, before procedure, this window was cleared with LVAD team. .  *  May need to initiate IM B12.   Vena Rua  03/08/2014, 9:13 AM Pager: [phone]  GI Attending Note   Chart was reviewed and patient was examined. X-rays and lab were reviewed.    I agree with management and plans.  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  Patient has telangiectasia and mild hypoalbuminemia.  I wonder whether he has chronic liver disease, in view of thrombocytopenia.  He has symptomatic hemorrhoids.  Plan upper endoscopy and colonoscopy tomorrow.  Would consider CT of the abdomen to evaluate his liver.  Hemorrhoids can be treated by banding but he would have to be off anticoagulants in order to do this.  Sandy Salaam. Deatra Ina, M.D., [facility] Cell [phone]

## 2014-03-09 ENCOUNTER — Inpatient Hospital Stay (HOSPITAL_COMMUNITY): Payer: Medicare Other | Admitting: Certified Registered Nurse Anesthetist

## 2014-03-09 ENCOUNTER — Inpatient Hospital Stay (HOSPITAL_COMMUNITY): Payer: Medicare Other

## 2014-03-09 ENCOUNTER — Encounter (HOSPITAL_COMMUNITY): Payer: Self-pay | Admitting: Certified Registered Nurse Anesthetist

## 2014-03-09 ENCOUNTER — Encounter (HOSPITAL_COMMUNITY)
Admission: RE | Disposition: A | Discharge status: Home / Self Care | Payer: Self-pay | Source: Ambulatory Visit | Attending: Internal Medicine

## 2014-03-09 ENCOUNTER — Encounter (HOSPITAL_COMMUNITY): Payer: Medicare Other | Admitting: Certified Registered Nurse Anesthetist

## 2014-03-09 DIAGNOSIS — K259 Gastric ulcer, unspecified as acute or chronic, without hemorrhage or perforation: Secondary | ICD-10-CM

## 2014-03-09 DIAGNOSIS — Z95811 Presence of heart assist device: Secondary | ICD-10-CM | POA: Diagnosis not present

## 2014-03-09 DIAGNOSIS — D509 Iron deficiency anemia, unspecified: Secondary | ICD-10-CM | POA: Diagnosis not present

## 2014-03-09 DIAGNOSIS — K648 Other hemorrhoids: Secondary | ICD-10-CM

## 2014-03-09 DIAGNOSIS — D126 Benign neoplasm of colon, unspecified: Secondary | ICD-10-CM

## 2014-03-09 DIAGNOSIS — K922 Gastrointestinal hemorrhage, unspecified: Secondary | ICD-10-CM | POA: Diagnosis not present

## 2014-03-09 DIAGNOSIS — I5022 Chronic systolic (congestive) heart failure: Secondary | ICD-10-CM | POA: Diagnosis not present

## 2014-03-09 HISTORY — PX: ESOPHAGOGASTRODUODENOSCOPY: SHX5428

## 2014-03-09 HISTORY — PX: COLONOSCOPY: SHX5424

## 2014-03-09 LAB — CBC
HEMATOCRIT: 30.8 % — AB (ref 39.0–52.0)
Hemoglobin: 8.8 g/dL — ABNORMAL LOW (ref 13.0–17.0)
MCH: 30.8 pg (ref 26.0–34.0)
MCHC: 28.6 g/dL — ABNORMAL LOW (ref 30.0–36.0)
MCV: 107.7 fL — ABNORMAL HIGH (ref 78.0–100.0)
PLATELETS: 130 10*3/uL — AB (ref 150–400)
RBC: 2.86 MIL/uL — ABNORMAL LOW (ref 4.22–5.81)
RDW: 18.2 % — ABNORMAL HIGH (ref 11.5–15.5)
WBC: 6.9 10*3/uL (ref 4.0–10.5)

## 2014-03-09 LAB — BASIC METABOLIC PANEL
BUN: 10 mg/dL (ref 6–23)
CHLORIDE: 107 meq/L (ref 96–112)
CO2: 28 meq/L (ref 19–32)
CREATININE: 0.83 mg/dL (ref 0.50–1.35)
Calcium: 9.3 mg/dL (ref 8.4–10.5)
GFR calc Af Amer: >90 mL/min (ref >90)
GFR calc non Af Amer: 86 mL/min — ABNORMAL LOW (ref >90)
Glucose, Bld: 103 mg/dL — ABNORMAL HIGH (ref 70–99)
Potassium: 4.9 mEq/L (ref 3.7–5.3)
SODIUM: 143 meq/L (ref 137–147)

## 2014-03-09 LAB — HEPARIN LEVEL (UNFRACTIONATED): Heparin Unfractionated: 0.72 IU/mL — ABNORMAL HIGH (ref 0.30–0.70)

## 2014-03-09 SURGERY — COLONOSCOPY
Anesthesia: Monitor Anesthesia Care

## 2014-03-09 MED ORDER — PROPOFOL 10 MG/ML IV BOLUS
INTRAVENOUS | Status: DC | PRN
  Administered 2014-03-09 (×2): 20 mg via INTRAVENOUS

## 2014-03-09 MED ORDER — FUROSEMIDE 10 MG/ML IJ SOLN
20.0000 mg | Freq: Once | INTRAMUSCULAR | Status: AC
  Administered 2014-03-09: 20 mg via INTRAVENOUS
  Filled 2014-03-09: qty 2

## 2014-03-09 MED ORDER — FENTANYL CITRATE 0.05 MG/ML IJ SOLN
INTRAMUSCULAR | Status: DC | PRN
  Administered 2014-03-09 (×3): 25 ug via INTRAVENOUS

## 2014-03-09 MED ORDER — PHENYLEPHRINE HCL 10 MG/ML IJ SOLN
INTRAMUSCULAR | Status: DC | PRN
Start: 2014-03-09 — End: 2014-03-09
  Administered 2014-03-09 (×2): 40 ug via INTRAVENOUS
  Administered 2014-03-09 (×4): 80 ug via INTRAVENOUS

## 2014-03-09 MED ORDER — WARFARIN SODIUM 5 MG PO TABS
5.0000 mg | ORAL_TABLET | Freq: Once | ORAL | Status: AC
  Administered 2014-03-09: 5 mg via ORAL
  Filled 2014-03-09: qty 1

## 2014-03-09 MED ORDER — WARFARIN - PHARMACIST DOSING INPATIENT
Freq: Every day | Status: DC
  Administered 2014-03-09 – 2014-03-10 (×2)

## 2014-03-09 MED ORDER — HEPARIN (PORCINE) IN NACL 100-0.45 UNIT/ML-% IJ SOLN
1600.0000 [IU]/h | INTRAMUSCULAR | Status: AC
  Administered 2014-03-09 – 2014-03-10 (×3): 1600 [IU]/h via INTRAVENOUS
  Filled 2014-03-09 (×5): qty 250

## 2014-03-09 MED ORDER — METOPROLOL SUCCINATE 12.5 MG HALF TABLET
12.5000 mg | ORAL_TABLET | Freq: Every day | ORAL | Status: DC
  Filled 2014-03-09: qty 1

## 2014-03-09 MED ORDER — IOHEXOL 300 MG/ML  SOLN
80.0000 mL | Freq: Once | INTRAMUSCULAR | Status: AC | PRN
  Administered 2014-03-09: 80 mL via INTRAVENOUS

## 2014-03-09 MED ORDER — LACTATED RINGERS IV SOLN
INTRAVENOUS | Status: DC | PRN
  Administered 2014-03-09: 13:00:00 via INTRAVENOUS

## 2014-03-09 MED ORDER — PROPOFOL INFUSION 10 MG/ML OPTIME
INTRAVENOUS | Status: DC | PRN
  Administered 2014-03-09: 75 ug/kg/min via INTRAVENOUS

## 2014-03-09 MED ORDER — EPHEDRINE SULFATE 50 MG/ML IJ SOLN
INTRAMUSCULAR | Status: DC | PRN
  Administered 2014-03-09: 5 mg via INTRAVENOUS

## 2014-03-09 NOTE — Progress Notes (Signed)
VAD coordinator paged to endoscopy lab. Pt having colonoscopy and endoscopy. Anesthesia at bedside.    Flow  Speed  PI  Power  MAP  Pre: 13:40 4.6  9200  6.5  5.4  96  Post sedation   13:50  4.7  9200  5.5  5.3  86  14:00  4.3  9200  4.5  5.1  40  14:15  5.2  9200  5.2  5.5  96  14:25  5.3  9200  5.2  5.6  86  14:30  5.1  9200  5.6  5.5  86  Dr. Haroldine Laws updated during case with drop in MAP with Diprivan. Pt responded with decreasing diprivan, volume, and neo.

## 2014-03-09 NOTE — Progress Notes (Signed)
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 Quit smoking 2003. Uses CPAP and O2 every night. He is s/p LVAD HM II implanted 01/12/13 under [facility].  He has needed to come to short stay 2 times in the past month for transfusions. He has needed a total of 4PRBC last. He received his last 2 PRBC last week and ASA was stopped. Admitted 6/8 for bridge with heparin for EGD/colonoscopy.  Denies SOB, orthopnea or CP. On heparin gtt. Bowel prep last night. Hgb stable 8.8. HR remains in the 40-50s. Weight down 2 lbs  LVAD INTERROGATION:  HeartMate II LVAD:  Flow 4.6 liters/min, speed 9200, power 5.3, PI 6.4.  No PI events  Objective:    Vital Signs:   Temp:  [98.1 F (36.7 C)-98.4 F (36.9 C)] 98.2 F (36.8 C) (06/10 0529) Pulse Rate:  [52-60] 52 (06/10 0529) Resp:  [16-20] 16 (06/10 0529) BP: (84)/(0) 84/0 mmHg (06/09 2129) SpO2:  [94 %-96 %] 94 % (06/10 0529) Weight:  [213 lb 3 oz (96.7 kg)] 213 lb 3 oz (96.7 kg) (06/10 0529) Last BM Date: 03/08/14 Mean arterial Pressure  82-88  Intake/Output:   Intake/Output Summary (Last 24 hours) at 03/09/14 0648 Last data filed at 03/09/14 0530  Gross per 24 hour  Intake      0 ml  Output   1075 ml  Net  -1075 ml     Physical Exam: General: Well appearing. No resp difficulty, lying flat in bed  HEENT: normal  Neck: supple. JVP flat . Carotids 2+ bilat; no bruits. No lymphadenopathy or thryomegaly appreciated.  Cor: Mechanical heart sounds with LVAD hum present.  Lungs: clear  Abdomen: soft, nontender, nondistended. No hepatosplenomegaly. No bruits or masses. Good bowel sounds.  Driveline: C/D/I; securement device intact and driveline incorporated  Extremities: no cyanosis, clubbing, rash, edema  Neuro: alert & orientedx3, cranial nerves grossly intact. moves all 4 extremities w/o  difficulty. Affect pleasant  Telemetry: SR 50-60s   Labs: Basic Metabolic Panel:  Recent Labs Lab 03/03/14 0947 03/07/14 1641 03/08/14 0106 03/09/14 0130  NA 141 142 141 143  K 4.4 4.1 4.0 4.9  CL 107 106 106 107  CO2 23 26 26 28   GLUCOSE 110* 104* 124* 103*  BUN 13 13 12 10   CREATININE 0.79 0.80 0.79 0.83  CALCIUM 9.3 9.7 9.3 9.3    Liver Function Tests:  Recent Labs Lab 03/07/14 1641  AST 24  ALT 12  ALKPHOS 55  BILITOT 0.5  PROT 5.7*  ALBUMIN 3.1*   No results found for this basename: LIPASE, AMYLASE,  in the last 168 hours No results found for this basename: AMMONIA,  in the last 168 hours  CBC:  Recent Labs Lab 03/03/14 0947 03/07/14 1641 03/08/14 0106 03/09/14 0130  WBC 6.3 6.3 7.0 6.9  NEUTROABS  --  4.8  --   --   HGB 7.6* 8.8* 8.7* 8.8*  HCT 26.1* 30.7* 30.1* 30.8*  MCV 110.1* 108.1* 108.7* 107.7*  PLT 136* 135* 137* 130*    INR:  Recent Labs Lab 03/03/14 0947 03/07/14 1641  INR 2.96* 1.39    Other results:  EKG:   Imaging: No results found.   Medications:     Scheduled Medications: . budesonide-formoterol  2 puff Inhalation BID  . citalopram  40 mg Oral Daily  .  docusate sodium  200 mg Oral Daily  . levothyroxine  50 mcg Oral QAC breakfast  . metoprolol succinate  12.5 mg Oral BID  . pantoprazole  40 mg Oral Daily  . simvastatin  40 mg Oral q1800  . tiotropium  18 mcg Inhalation Daily    Infusions: . sodium chloride 20 mL/hr at 03/08/14 2009  . heparin 1,600 Units/hr (03/09/14 0233)    PRN Medications: acetaminophen, acetaminophen   Assessment:   1) Anemia  2) ?GIB  3) Chronic systolic heart failure  - s/p LVAD 12/2012  4) PAF  5) Depression  Plan/Discussion:    [name] was admitted 6/8 for bridge with heparin for his LVAD in order for him to undergo upper/lower endoscopy. He has needed multiple transfusions in the past month and suspect that he has AVMs. No ASA and coumadin on hold.   Last INR  1.39. Hgb stable and no signs of bleeding. Will go to endo today with Anesthesia for arterial line placement to undergo upper/lower colonoscopy with push enteroscopy and endoscopy. Depending on what they find may start Coumadin later tonight.  SB 50-60s on lower dose of Toprol. Will cut back to 12.5 mg daily.    I reviewed the LVAD parameters from today, and compared the results to the patient's prior recorded data.  No programming changes were made.  The LVAD is functioning within specified parameters.  The patient performs LVAD self-test daily.  LVAD interrogation was negative for any significant power changes, alarms or PI events/speed drops.  LVAD equipment check completed and is in good working order.  Back-up equipment present.   LVAD education done on emergency procedures and precautions and reviewed exit site care.  Length of Stay: 2  Rande Brunt NP-C 03/09/2014, 6:48 AM  VAD Team --- VAD ISSUES ONLY--- Pager [phone] (7am - 7am)  Advanced Heart Failure Team  Pager [phone] (M-F; 7a - 4p)  Please contact [facility] for night-coverage after hours (4p -7a ) and weekends on [email or web address]  Patient seen and examined with Junie Bame, NP. We discussed all aspects of the encounter. I agree with the assessment and plan as stated above. For endoscopy today. On heparin. Remains bradycardic. Will stop b-blocker. VAD parameters and Hgb stable.   Kaelen Caughlin,MD 7:20 AM

## 2014-03-09 NOTE — Anesthesia Postprocedure Evaluation (Signed)
  Anesthesia Post-op Note  Patient: [name]  Procedure(s) Performed: Procedure(s) with comments: COLONOSCOPY (N/A) - LVAD  patient ESOPHAGOGASTRODUODENOSCOPY (EGD) (N/A)  Patient Location: Endoscopy Unit  Anesthesia Type:MAC  Level of Consciousness: awake, alert , oriented and patient cooperative  Airway and Oxygen Therapy: Patient Spontanous Breathing  Post-op Pain: none  Post-op Assessment: Post-op Vital signs reviewed, Patient's Cardiovascular Status Stable, Respiratory Function Stable, Patent Airway and No signs of Nausea or vomiting  Post-op Vital Signs: Reviewed and stable  Last Vitals:  Filed Vitals:   03/09/14 1234  BP: 92/0  Pulse:   Temp:   Resp:     Complications:No apparent anesthesia complications

## 2014-03-09 NOTE — Progress Notes (Signed)
ANTICOAGULATION CONSULT NOTE - Follow Up Consult  Pharmacy Consult for Heparin  Indication: LVAD  No Known Allergies  Patient Measurements: Height: 6' (182.9 cm) Weight: 215 lb 13.3 oz (97.9 kg) IBW/kg (Calculated) : 77.6  Vital Signs: Temp: 98.4 F (36.9 C) (06/09 2022) Temp src: Oral (06/09 2022) BP: 84/0 mmHg (06/09 2129) Pulse Rate: 53 (06/09 2129)  Labs:  Recent Labs  03/07/14 1641 03/08/14 0106 03/08/14 1453 03/09/14 0130  HGB 8.8* 8.7*  --  8.8*  HCT 30.7* 30.1*  --  30.8*  PLT 135* 137*  --  130*  LABPROT 16.7*  --   --   --   INR 1.39  --   --   --   HEPARINUNFRC  --  0.25* 0.85* 0.72*  CREATININE 0.80 0.79  --  0.83    Estimated Creatinine Clearance: 97.5 ml/min (by C-G formula based on Cr of 0.83).   Medications:  Heparin 1750 units/hr  Assessment: 73 y/o LVAD patient on heparin. HL is still high at 0.72 despite rate decrease. Hgb is stable. Trying to keep heparin level at lower end of range with possible GIB/AVM. RN reports no bleeding issues tonight.   Goal of Therapy:  Heparin level 0.3-0.7 units/ml Monitor platelets by anticoagulation protocol: Yes   Plan:  -Decrease heparin to 1600 units/hr -1100 HL -Daily CBC/HL -Monitor for bleeding, trend Hgb  Narda Bonds 03/09/2014,2:23 AM

## 2014-03-09 NOTE — Progress Notes (Signed)
          Daily Rounding Note  03/09/2014, 12:03 PM  LOS: 2 days   SUBJECTIVE:       No problems with bowel prep.  OBJECTIVE:         Vital signs in last 24 hours:    Temp:  [98.1 F (36.7 C)-98.4 F (36.9 C)] 98.2 F (36.8 C) (06/10 0529) Pulse Rate:  [52-60] 52 (06/10 0529) Resp:  [16-20] 16 (06/10 0529) BP: (84)/(0) 84/0 mmHg (06/09 2129) SpO2:  [94 %-96 %] 94 % (06/10 0529) Weight:  [96.7 kg (213 lb 3 oz)] 96.7 kg (213 lb 3 oz) (06/10 0529) Last BM Date: 03/09/14 Pt sleeping with unlabored breathing, did not examine or awaken pt.   Intake/Output from previous day: 06/09 0701 - 06/10 0700 In: -  Out: 1075 [Urine:1075]  Intake/Output this shift:    Lab Results:  Recent Labs  03/07/14 1641 03/08/14 0106 03/09/14 0130  WBC 6.3 7.0 6.9  HGB 8.8* 8.7* 8.8*  HCT 30.7* 30.1* 30.8*  PLT 135* 137* 130*   BMET  Recent Labs  03/07/14 1641 03/08/14 0106 03/09/14 0130  NA 142 141 143  K 4.1 4.0 4.9  CL 106 106 107  CO2 26 26 28   GLUCOSE 104* 124* 103*  BUN 13 12 10   CREATININE 0.80 0.79 0.83  CALCIUM 9.7 9.3 9.3   LFT  Recent Labs  03/07/14 1641  PROT 5.7*  ALBUMIN 3.1*  AST 24  ALT 12  ALKPHOS 55  BILITOT 0.5   PT/INR  Recent Labs  03/07/14 1641  LABPROT 16.7*  INR 1.39    ASSESMENT:   * Chronic macrocytic anemia, transfusion requiring. IV iron given about 4 weeks ago.  Has had some rectal bleeding but this has not been severe, he attributes this to hemorrhoids.  * Thrombocytopenia.  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  * Many years of chronic Coumadin for hx a fib, and now for the LVAD status. On hold for last 3 days, on Heparin window currently.  * Hx intestinal blockage 1969, s/p ex lap and removal of portion of bowel(SB vs colon ??). No problems since then.  * Pt reported hx colon polyps . 10 yrs ago. No pathology or endoscopic records found.    PLAN   *  Colonoscopy and EGD  today.     Azucena Freed  03/09/2014, 12:03 PM Pager: [phone]  GI Attending Note  I have personally taken an interval history, reviewed the chart, and examined the patient.  I agree with the extender's note, impression and recommendations.  Sandy Salaam. Deatra Ina, MD, [facility] [phone]

## 2014-03-09 NOTE — Transfer of Care (Signed)
Immediate Anesthesia Transfer of Care Note  Patient: [name]  Procedure(s) Performed: Procedure(s) with comments: COLONOSCOPY (N/A) - LVAD  patient ESOPHAGOGASTRODUODENOSCOPY (EGD) (N/A)  Patient Location: Endoscopy Unit  Anesthesia Type:MAC  Level of Consciousness: awake, alert , oriented and patient cooperative  Airway & Oxygen Therapy: Patient Spontanous Breathing  Post-op Assessment: Report given to PACU RN, Post -op Vital signs reviewed and stable and Patient moving all extremities X 4  Post vital signs: Reviewed and stable  Complications: No apparent anesthesia complications

## 2014-03-09 NOTE — Op Note (Signed)
[facility] [place], [address]   ENDOSCOPY PROCEDURE REPORT  PATIENT: [name], [name]  MR#: [id number] BIRTHDATE: September 04, 1941 , 73  yrs. old GENDER: Male ENDOSCOPIST: Inda Castle, MD REFERRED BY:  Jolaine Artist, M.D. PROCEDURE DATE:  03/09/2014 PROCEDURE:  EGD, diagnostic ASA CLASS:     Class III INDICATIONS:  Occult blood positive.   Anemia. MEDICATIONS: There was residual sedation effect present from prior procedure and MAC sedation, administered by CRNA TOPICAL ANESTHETIC:  DESCRIPTION OF PROCEDURE: After the risks benefits and alternatives of the procedure were thoroughly explained, informed consent was obtained.  The PENTAX GASTOROSCOPE [id number] endoscope was introduced through the mouth and advanced to the third portion of the duodenum. Without limitations.  The instrument was slowly withdrawn as the mucosa was fully examined.      There were few superficial erosions at the GE junction.   The remainder of the upper endoscopy exam was otherwise normal. Retroflexed views revealed no abnormalities.     The scope was then withdrawn from the patient and the procedure completed.  COMPLICATIONS: There were no complications. ENDOSCOPIC IMPRESSION: 1.   There were few superficial erosions at the GE junction 2.   The remainder of the upper endoscopy exam was otherwise normal  RECOMMENDATIONS: 1.  Continue current medications 2.  Continue PPI  REPEAT EXAM:  eSigned:  Inda Castle, MD 03/09/2014 2:50 PM   CC:

## 2014-03-09 NOTE — Progress Notes (Addendum)
Colonoscopy was limited due to poor prep.  A 6 mm polyp was removed and a clip was placed to minimize the risk for post-polypectomy bleeding in the face of anticoagulation therapy.  A small 2 mm polyp was left. Upper endoscopy was unremarkable except for minimal erosive gastritis.  Recommendations 1.  continue protonix 2.  CT of the abdomen to rule out cirrhotic liver 3.  in view of abrupt drop in hemoglobin would consider outpatient capsule study.  He can remain on anticoagulants for this exam.

## 2014-03-09 NOTE — Progress Notes (Signed)
ANTICOAGULATION CONSULT NOTE - Follow Up Consult  Pharmacy Consult for Heparin / Coumadin  Indication: LVAD  No Known Allergies  Patient Measurements: Height: 6' (182.9 cm) Weight: 213 lb 3 oz (96.7 kg) IBW/kg (Calculated) : 77.6  Vital Signs: Temp: 98.2 F (36.8 C) (06/10 0529) Temp src: Oral (06/10 1217) BP: 100/0 mmHg (06/10 1444) Pulse Rate: 61 (06/10 1444)  Labs:  Recent Labs  03/07/14 1641 03/08/14 0106 03/08/14 1453 03/09/14 0130  HGB 8.8* 8.7*  --  8.8*  HCT 30.7* 30.1*  --  30.8*  PLT 135* 137*  --  130*  LABPROT 16.7*  --   --   --   INR 1.39  --   --   --   HEPARINUNFRC  --  0.25* 0.85* 0.72*  CREATININE 0.80 0.79  --  0.83    Estimated Creatinine Clearance: 96.9 ml/min (by C-G formula based on Cr of 0.83).   Medications:  Heparin 1750 units/hr  Assessment: 73 y/o LVAD patient on heparin drip bridge while Coumadin on hold for EGD and colonoscopy.  Heparin held this am for procedure - 1 polyp removed, plan capsule endoscopy tomorrow - ok to restart AC  Goal of Therapy:  Heparin level 0.3-0.7 units/ml Monitor platelets by anticoagulation protocol: Yes   Plan:  -Restart heparin at 1600 units/hr -2300 HL Warfarin 5mg  x1 -Daily CBC/HL/INR -Monitor for bleeding, trend Hgb  Bonnita Nasuti Pharm.D. CPP, BCPS Clinical Pharmacist [phone] 03/09/2014 4:27 PM

## 2014-03-09 NOTE — Anesthesia Preprocedure Evaluation (Signed)
Anesthesia Evaluation  Patient identified by MRN, date of birth, ID band Patient awake    Reviewed: Allergy & Precautions, H&P , NPO status , Patient's Chart, lab work & pertinent test results, reviewed documented beta blocker date and time   Airway Mallampati: II TM Distance: >3 FB Neck ROM: full    Dental   Pulmonary sleep apnea and Continuous Positive Airway Pressure Ventilation , COPD COPD inhaler, former smoker,  breath sounds clear to auscultation        Cardiovascular hypertension, On Medications and On Home Beta Blockers + CAD, + Past MI, + CABG and +CHF Atrial Fibrillation + Cardiac Defibrillator Rhythm:regular     Neuro/Psych PSYCHIATRIC DISORDERS TIA   GI/Hepatic negative GI ROS, Neg liver ROS,   Endo/Other  Hypothyroidism   Renal/GU negative Renal ROS  negative genitourinary   Musculoskeletal   Abdominal   Peds  Hematology  (+) anemia ,   Anesthesia Other Findings See surgeon's H&P   Reproductive/Obstetrics negative OB ROS                           Anesthesia Physical Anesthesia Plan  ASA: IV  Anesthesia Plan: MAC   Post-op Pain Management:    Induction: Intravenous  Airway Management Planned:   Additional Equipment:   Intra-op Plan:   Post-operative Plan:   Informed Consent: I have reviewed the patients History and Physical, chart, labs and discussed the procedure including the risks, benefits and alternatives for the proposed anesthesia with the patient or authorized representative who has indicated his/her understanding and acceptance.   Dental Advisory Given  Plan Discussed with: CRNA and Surgeon  Anesthesia Plan Comments:         Anesthesia Quick Evaluation

## 2014-03-09 NOTE — Op Note (Signed)
[facility] [place], [id number]   COLONOSCOPY PROCEDURE REPORT  PATIENT: [name], [name]  MR#: [id number] BIRTHDATE: December 04, 1940 , 77  yrs. old GENDER: Male ENDOSCOPIST: Inda Castle, MD REFERRED WN:IOEVOJ Karlyn Agee, M.D. PROCEDURE DATE:  03/09/2014 PROCEDURE:   Colonoscopy with snare polypectomy and Colonoscopy with control of bleeding ASA CLASS:   Class III INDICATIONS:heme-positive stool and Anemia, non-specific. MEDICATIONS: MAC sedation, administered by CRNA  DESCRIPTION OF PROCEDURE:   After the risks benefits and alternatives of the procedure were thoroughly explained, informed consent was obtained.  A digital rectal exam revealed no abnormalities of the rectum.   The [employer] Adult Colon [id number] endoscope was introduced through the anus and advanced to the cecum, which was identified by both the appendix and ileocecal valve. No adverse events experienced.   Limited by poor preparation.  There was a significant amount of retained thick liquid brown stool. The quality of the prep was poor, using MiraLax The instrument was then slowly withdrawn as the colon was fully examined.      COLON FINDINGS: A sessile polyp measuring 2 mm  in size was found in the proximal transverse colon. no attempt was made to remove the polyp because of its low malignant potential but high-risk of bleeding post procedure with heparin therapy.  A sessile polyp measuring 6 mm in size was found in the descending colon.  A polypectomy was performed with a cold snare.  The resection was complete and the polyp tissue was completely retrieved.  There was minor bleeding at the polypectomy site.  An endoscopic clip was placed because of the increased risk for bleeding post procedure with heparin therapy.  Hemostasis was achieved.  Internal hemorrhoids were found.  Retroflexed views revealed no abnormalities. The time to cecum=  .  Withdrawal time=18  minutes 0 seconds.  The scope was withdrawn and the procedure completed. COMPLICATIONS: There were no complications.  ENDOSCOPIC IMPRESSION: 1.   colon polyps-status post polypectomy and clip placement of the larger (6 mm) polyp 2.  internal hemorrhoids  Exam was limited secondary to poor prep.  There are no obvious large mucosal lesions but small polyps, AVMs could not be excluded  RECOMMENDATIONS: EGD  eSigned:  Inda Castle, MD 03/09/2014 2:47 PM   cc:   PATIENT NAME:  [name], [name] MR#: [id number]

## 2014-03-09 NOTE — Progress Notes (Signed)
Pt back in room s/p EGD/colonoscopy; pt awake and alert; will cont. To monitor.

## 2014-03-10 ENCOUNTER — Encounter (HOSPITAL_COMMUNITY)
Admission: RE | Disposition: A | Discharge status: Home / Self Care | Payer: Medicare Other | Source: Ambulatory Visit | Attending: Internal Medicine

## 2014-03-10 ENCOUNTER — Encounter: Payer: Self-pay | Admitting: Gastroenterology

## 2014-03-10 ENCOUNTER — Ambulatory Visit (HOSPITAL_COMMUNITY)
Admission: RE | Admit: 2014-03-10 | Discharge: 2014-03-10 | Disposition: A | Discharge status: Home / Self Care | Payer: Medicare Other | Source: Ambulatory Visit | Admitting: Internal Medicine

## 2014-03-10 DIAGNOSIS — D509 Iron deficiency anemia, unspecified: Secondary | ICD-10-CM

## 2014-03-10 DIAGNOSIS — K922 Gastrointestinal hemorrhage, unspecified: Secondary | ICD-10-CM | POA: Diagnosis not present

## 2014-03-10 DIAGNOSIS — Z95811 Presence of heart assist device: Secondary | ICD-10-CM | POA: Diagnosis not present

## 2014-03-10 DIAGNOSIS — I4891 Unspecified atrial fibrillation: Secondary | ICD-10-CM | POA: Diagnosis not present

## 2014-03-10 DIAGNOSIS — I5022 Chronic systolic (congestive) heart failure: Secondary | ICD-10-CM | POA: Diagnosis not present

## 2014-03-10 HISTORY — PX: GIVENS CAPSULE STUDY: SHX5432

## 2014-03-10 LAB — BASIC METABOLIC PANEL
BUN: 8 mg/dL (ref 6–23)
CALCIUM: 9.7 mg/dL (ref 8.4–10.5)
CHLORIDE: 103 meq/L (ref 96–112)
CO2: 24 mEq/L (ref 19–32)
Creatinine, Ser: 0.8 mg/dL (ref 0.50–1.35)
GFR calc non Af Amer: 87 mL/min — ABNORMAL LOW (ref >90)
Glucose, Bld: 91 mg/dL (ref 70–99)
Potassium: 4.5 mEq/L (ref 3.7–5.3)
Sodium: 141 mEq/L (ref 137–147)

## 2014-03-10 LAB — CBC
HCT: 31 % — ABNORMAL LOW (ref 39.0–52.0)
Hemoglobin: 9.1 g/dL — ABNORMAL LOW (ref 13.0–17.0)
MCH: 31.4 pg (ref 26.0–34.0)
MCHC: 29.4 g/dL — AB (ref 30.0–36.0)
MCV: 106.9 fL — ABNORMAL HIGH (ref 78.0–100.0)
Platelets: 127 10*3/uL — ABNORMAL LOW (ref 150–400)
RBC: 2.9 MIL/uL — ABNORMAL LOW (ref 4.22–5.81)
RDW: 18 % — AB (ref 11.5–15.5)
WBC: 6.2 10*3/uL (ref 4.0–10.5)

## 2014-03-10 LAB — HEPATITIS B SURFACE ANTIGEN: Hepatitis B Surface Ag: NEGATIVE

## 2014-03-10 LAB — HEPARIN LEVEL (UNFRACTIONATED)
HEPARIN UNFRACTIONATED: 0.4 [IU]/mL (ref 0.30–0.70)
Heparin Unfractionated: 0.36 IU/mL (ref 0.30–0.70)

## 2014-03-10 LAB — HEPATITIS B SURFACE ANTIBODY,QUALITATIVE: Hep B S Ab: NEGATIVE

## 2014-03-10 LAB — PROTIME-INR
INR: 1.22 (ref 0.00–1.49)
PROTHROMBIN TIME: 15.1 s (ref 11.6–15.2)

## 2014-03-10 LAB — HEPATITIS C ANTIBODY: HCV Ab: NEGATIVE

## 2014-03-10 SURGERY — IMAGING PROCEDURE, GI TRACT, INTRALUMINAL, VIA CAPSULE
Anesthesia: LOCAL

## 2014-03-10 MED ORDER — FERROUS GLUCONATE 324 (38 FE) MG PO TABS
324.0000 mg | ORAL_TABLET | Freq: Three times a day (TID) | ORAL | Status: DC
  Administered 2014-03-11: 324 mg via ORAL
  Filled 2014-03-10 (×4): qty 1

## 2014-03-10 MED ORDER — LOSARTAN POTASSIUM 25 MG PO TABS
25.0000 mg | ORAL_TABLET | Freq: Every day | ORAL | Status: DC
  Administered 2014-03-10: 25 mg via ORAL
  Filled 2014-03-10 (×2): qty 1

## 2014-03-10 MED ORDER — WARFARIN SODIUM 7.5 MG PO TABS
7.5000 mg | ORAL_TABLET | Freq: Once | ORAL | Status: AC
  Administered 2014-03-10: 7.5 mg via ORAL
  Filled 2014-03-10: qty 1

## 2014-03-10 SURGICAL SUPPLY — 1 items: TOWEL COTTON PACK 4EA (MISCELLANEOUS) ×4 IMPLANT

## 2014-03-10 NOTE — Progress Notes (Addendum)
Daily Rounding Note  03/10/2014, 8:20 AM  LOS: 3 days   SUBJECTIVE:       Capsule endoscopy in process, completes at 7 PM tonight.   OBJECTIVE:         Vital signs in last 24 hours:    Temp:  [98 F (36.7 C)-98.2 F (36.8 C)] 98.2 F (36.8 C) (06/11 0602) Pulse Rate:  [53-61] 60 (06/11 0602) Resp:  [22-24] 22 (06/10 2059) BP: (92-100)/(0) 100/0 mmHg (06/10 1444) SpO2:  [93 %-98 %] 94 % (06/11 0602) Weight:  [94.348 kg (208 lb)-94.484 kg (208 lb 4.8 oz)] 94.348 kg (208 lb) (06/11 0719) Last BM Date: 03/09/14 General: a bit tired but comfortable   Heart: not reexamined GI:  CE monitor/harness in place.  Chest: breathing unlabored, no cough.  Neuro/Psych:  Pleasant, alert  Intake/Output from previous day: 06/10 0701 - 06/11 0700 In: 836 [P.O.:120; I.V.:716] Out: 2800 [Urine:2800]  Intake/Output this shift: Total I/O In: 16 [I.V.:16] Out: 400 [Urine:400]  Lab Results:  Recent Labs  03/08/14 0106 03/09/14 0130 03/10/14 0424  WBC 7.0 6.9 6.2  HGB 8.7* 8.8* 9.1*  HCT 30.1* 30.8* 31.0*  PLT 137* 130* 127*   BMET  Recent Labs  03/08/14 0106 03/09/14 0130 03/10/14 0424  NA 141 143 141  K 4.0 4.9 4.5  CL 106 107 103  CO2 26 28 24   GLUCOSE 124* 103* 91  BUN 12 10 8   CREATININE 0.79 0.83 0.80  CALCIUM 9.3 9.3 9.7   LFT  Recent Labs  03/07/14 1641  PROT 5.7*  ALBUMIN 3.1*  AST 24  ALT 12  ALKPHOS 55  BILITOT 0.5   PT/INR  Recent Labs  03/07/14 1641 03/10/14 0424  LABPROT 16.7* 15.1  INR 1.39 1.22   Hepatitis Panel No results found for this basename: HEPBSAG, HCVAB, HEPAIGM, HEPBIGM,  in the last 72 hours  Studies/Results: Ct Abdomen W Contrast 03/09/2014   CLINICAL DATA:  Low platelets.  Anemia.  EXAM: CT ABDOMEN WITH CONTRAST  TECHNIQUE: Multidetector CT imaging of the abdomen was performed using the standard protocol following bolus administration of intravenous contrast.   CONTRAST:  21mL OMNIPAQUE IOHEXOL 300 MG/ML  SOLN  COMPARISON:  None.  FINDINGS: The surface of the liver is nodular. There is an abnormal ratio of the caudate lobe to the right lobe consistent with cirrhosis. There are multiple tiny stones in the gallbladder. There is no biliary ductal dilatation. The spleen is normal in size. The pancreas, adrenal glands, and kidneys are normal except for a benign appearing 5.2 cm cyst on the posterior aspect of the upper pole the right kidney.  The bowel is normal except for a 16 mm metallic foreign body in the distal transverse colon of unknown etiology.  Left ventricular assist device is noted in place with cardiomegaly. No infiltrates or effusions at the lung bases. The patient does have multiple areas of pleural calcification in the lower chest.  IMPRESSION: 1. Cirrhosis. 2. 16 mm linear metallic foreign body in the distal transverse colon. 3. Cholelithiasis.   Electronically Signed   By: Rozetta Nunnery M.D.   On: 03/09/2014 18:37    ASSESMENT:   * Chronic macrocytic anemia, transfusion requiring. IV iron given about 4 weeks ago.  Occasional minor rectal bleeding.  Colonoscopy and EGD 6/10: internal hemorrhoids, removal and site clipping of  37mm polyp @ desc colon, smaller transverse polyp not removed.  Int hemorrhoids.  Superficial GE Jx  erosions.   * Thrombocytopenia.  *  Cirrhosis.  CT abdomen confirms. Also uncomplicated gallstones.  LFTs normal.  Suspect cardiac and/or NASH in etiology.  This finding was explained to pt and his wife.  *  FB in distal transverse colon is the endoclip.  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  * Many years of chronic Coumadin for hx a fib, and now for LVAD status. Now restarted post colonoscopy with Heparin also in place, * Hx intestinal blockage 1969, s/p ex lap and removal of portion of bowel(SB vs colon ??). No problems since.  * Pt reported hx colon polyps . 10 yrs ago. No pathology or endoscopic records found.     PLAN    *  Capsule endoscopy in process.  Likely will not be read until next week, can discharge home before study read.  *  Pathology on polyp pending.  *  Daily PPI.  Restart TID [place] tomorrow in order to avoid interference with CE study.  *  ordered Hep B Surf Ab and Ag and Hep C Ab    Azucena Freed  03/10/2014, 8:20 AM Pager: [phone]  GI Attending Note  I have personally taken an interval history, reviewed the chart, and examined the patient.  I agree with the extender's note, impression and recommendations.  Not surprisingly the patient has cirrhosis, perhaps cryptogenic.  His could explain thrombocytopenia.  Will followup capsule study.  Polyp removed was adenomatous.  If stable may consider followup colonoscopy 5 years.  Signing off.  Sandy Salaam. Deatra Ina, MD, [facility] [phone]

## 2014-03-10 NOTE — Progress Notes (Signed)
Cloyde Reams made aware of pt's current MAP; no new orders; will cont. To monitor.

## 2014-03-10 NOTE — Progress Notes (Signed)
CCMD notified RN that pt went into A-fib. Pt's HR in the 80s. EKG performed. EKG stated pt was in a-fib with rate of 85. Pt asymptomatic. MD notified, no new orders. Will continue to monitor.

## 2014-03-10 NOTE — Progress Notes (Signed)
CARDIAC REHAB PHASE I   PRE:  Rate/Rhythm: 60 SR  BP:  Supine:   Sitting: 102/ Dopplered  Standing:    SaO2: 90 RA  MODE:  Ambulation: 890 ft   POST:  Rate/Rhythm: 74 SR  BP:  Supine:   Sitting: 106/ Dopplered  Standing:    SaO2: 91 RA 1420-1500 On arrival pt reluctant to walk, but able to talk him into it. Assisted X 1. Gait steady. Pt able to walk 890 feet with several standing rest stops. BP before walk 102 after 106. Pt more SOB with walking today and took more rest stops. Pt to recliner after walk with cal light in reach and wife present.Reported BP and sats to pt's nurse.  Rodney Langton RN 03/10/2014 2:57 PM

## 2014-03-10 NOTE — Progress Notes (Addendum)
ANTICOAGULATION CONSULT NOTE - Follow Up Consult  Pharmacy Consult for Heparin  Indication: LVAD  No Known Allergies  Patient Measurements: Height: 6' (182.9 cm) Weight: 213 lb 3 oz (96.7 kg) IBW/kg (Calculated) : 77.6  Vital Signs: Temp: 98 F (36.7 C) (06/10 2019) Temp src: Oral (06/10 2019) BP: 100/0 mmHg (06/10 1444) Pulse Rate: 53 (06/10 2059)  Labs:  Recent Labs  03/07/14 1641  03/08/14 0106 03/08/14 1453 03/09/14 0130 03/09/14 2308  HGB 8.8*  --  8.7*  --  8.8*  --   HCT 30.7*  --  30.1*  --  30.8*  --   PLT 135*  --  137*  --  130*  --   LABPROT 16.7*  --   --   --   --   --   INR 1.39  --   --   --   --   --   HEPARINUNFRC  --   < > 0.25* 0.85* 0.72* 0.36  CREATININE 0.80  --  0.79  --  0.83  --   < > = values in this interval not displayed.  Estimated Creatinine Clearance: 96.9 ml/min (by C-G formula based on Cr of 0.83).   Medications:  Heparin 1600 units/hr  Assessment: 73 y/o LVAD patient on heparin. HL is 0.36 after heparin re-start s/p limited colonoscopy with polyp removal. Other labs as above.   Goal of Therapy:  Heparin level 0.3-0.7 units/ml Monitor platelets by anticoagulation protocol: Yes   Plan:  -Continue heparin at 1600 units/hr -AM HL -Daily CBC/HL -Monitor for bleeding, trend Hgb  [name] 03/10/2014,2:32 AM  Addendum 5:57 AM HL this AM is 0.4, continue at 1600 units/hr, daily CBC/HL JLedford, PharmD

## 2014-03-10 NOTE — Progress Notes (Signed)
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 Quit smoking 2003. Uses CPAP and O2 every night. He is s/p LVAD HM II implanted 01/12/13 under DT.  He has needed to come to short stay 2 times in the past month for transfusions. He has needed a total of 4PRBC last. He received his last 2 PRBC last week and ASA was stopped. Admitted 6/8 for bridge with heparin for EGD/colonoscopy.  Went for upper GI and lower GI scope yesterday. Procedure limited d/t poor bowel prep. Sessile polyp 6 mm size in descending colon was clipped. Recommended capsule study and CT of abdomen to r/o cirrhotic liver. Received 1 dose of IV lasix after procedure. Denies SOB, orthopnea or CP. Remains on heparin gtt and coumadin restarted last night. Went into afib last night rate controlled, his BB was stopped yesterday.   LVAD INTERROGATION:  HeartMate II LVAD:  Flow 5.2 liters/min, speed 9200, power 5.4, PI 5.5.  No PI events  Objective:    Vital Signs:   Temp:  [98 F (36.7 C)-98.2 F (36.8 C)] 98.2 F (36.8 C) (06/11 0602) Pulse Rate:  [53-61] 60 (06/11 0602) Resp:  [22-24] 22 (06/10 2059) BP: (92-100)/(0) 100/0 mmHg (06/10 1444) SpO2:  [93 %-98 %] 94 % (06/11 0602) Weight:  [208 lb 4.8 oz (94.484 kg)] 208 lb 4.8 oz (94.484 kg) (06/11 0500) Last BM Date: 03/09/14 Mean arterial Pressure  82-88  Intake/Output:   Intake/Output Summary (Last 24 hours) at 03/10/14 0651 Last data filed at 03/10/14 0154  Gross per 24 hour  Intake    836 ml  Output   2800 ml  Net  -1964 ml     Physical Exam: General: Well appearing. No resp difficulty, lying flat in bed  HEENT: normal  Neck: supple. JVP flat . Carotids 2+ bilat; no bruits. No lymphadenopathy or thryomegaly appreciated.  Cor: Mechanical heart sounds with LVAD hum present.  Lungs: clear  Abdomen: soft,  nontender, nondistended. No hepatosplenomegaly. No bruits or masses. Good bowel sounds.  Driveline: C/D/I; securement device intact and driveline incorporated  Extremities: no cyanosis, clubbing, rash, edema  Neuro: alert & orientedx3, cranial nerves grossly intact. moves all 4 extremities w/o difficulty. Affect pleasant  Telemetry: Afib 50-60s   Labs: Basic Metabolic Panel:  Recent Labs Lab 03/03/14 0947 03/07/14 1641 03/08/14 0106 03/09/14 0130 03/10/14 0424  NA 141 142 141 143 141  K 4.4 4.1 4.0 4.9 4.5  CL 107 106 106 107 103  CO2 23 26 26 28 24   GLUCOSE 110* 104* 124* 103* 91  BUN 13 13 12 10 8   CREATININE 0.79 0.80 0.79 0.83 0.80  CALCIUM 9.3 9.7 9.3 9.3 9.7    Liver Function Tests:  Recent Labs Lab 03/07/14 1641  AST 24  ALT 12  ALKPHOS 55  BILITOT 0.5  PROT 5.7*  ALBUMIN 3.1*   No results found for this basename: LIPASE, AMYLASE,  in the last 168 hours No results found for this basename: AMMONIA,  in the last 168 hours  CBC:  Recent Labs Lab 03/03/14 0947 03/07/14 1641 03/08/14 0106 03/09/14 0130 03/10/14 0424  WBC 6.3 6.3 7.0 6.9 6.2  NEUTROABS  --  4.8  --   --   --   HGB 7.6* 8.8* 8.7* 8.8* 9.1*  HCT 26.1* 30.7* 30.1* 30.8* 31.0*  MCV 110.1* 108.1*  108.7* 107.7* 106.9*  PLT 136* 135* 137* 130* 127*    INR:  Recent Labs Lab 03/03/14 0947 03/07/14 1641 03/10/14 0424  INR 2.96* 1.39 1.22    Other results:  EKG:   Imaging: Ct Abdomen W Contrast  03/09/2014   CLINICAL DATA:  Low platelets.  Anemia.  EXAM: CT ABDOMEN WITH CONTRAST  TECHNIQUE: Multidetector CT imaging of the abdomen was performed using the standard protocol following bolus administration of intravenous contrast.  CONTRAST:  34mL OMNIPAQUE IOHEXOL 300 MG/ML  SOLN  COMPARISON:  None.  FINDINGS: The surface of the liver is nodular. There is an abnormal ratio of the caudate lobe to the right lobe consistent with cirrhosis. There are multiple tiny stones in the gallbladder.  There is no biliary ductal dilatation. The spleen is normal in size. The pancreas, adrenal glands, and kidneys are normal except for a benign appearing 5.2 cm cyst on the posterior aspect of the upper pole the right kidney.  The bowel is normal except for a 16 mm metallic foreign body in the distal transverse colon of unknown etiology.  Left ventricular assist device is noted in place with cardiomegaly. No infiltrates or effusions at the lung bases. The patient does have multiple areas of pleural calcification in the lower chest.  IMPRESSION: 1. Cirrhosis. 2. 16 mm linear metallic foreign body in the distal transverse colon. 3. Cholelithiasis.   Electronically Signed   By: Rozetta Nunnery M.D.   On: 03/09/2014 18:37     Medications:     Scheduled Medications: . budesonide-formoterol  2 puff Inhalation BID  . citalopram  40 mg Oral Daily  . docusate sodium  200 mg Oral Daily  . levothyroxine  50 mcg Oral QAC breakfast  . pantoprazole  40 mg Oral Daily  . simvastatin  40 mg Oral q1800  . tiotropium  18 mcg Inhalation Daily  . Warfarin - Pharmacist Dosing Inpatient   Does not apply q1800    Infusions: . heparin 1,600 Units/hr (03/10/14 0012)    PRN Medications: acetaminophen, acetaminophen   Assessment:   1) Anemia  2) ?GIB  3) Chronic systolic heart failure  - s/p LVAD 12/2012  4) PAF  5) Depression 6) Cirrhosis  Plan/Discussion:    [name] was admitted 6/8 for bridge with heparin for his LVAD in order for him to undergo upper/lower endoscopy. He has needed multiple transfusions in the past month and suspect that he has AVMs. No ASA and coumadin on hold.   Went for upper and lower GI yesterday which was limited d/t poor bowel prep. CT last night showing cirrhosis of liver. Restarted on Coumadin and INR 1.22. Remains on heparin gtt. Pharmacy dosing coumadin and heparin gtt. Will have capsule study done today. Anemia panel had low sat ratios but normal iron in the past and he  received IV iron. Will check folate and B12 today.   Went into afib last night rate controlled. No longer on BB d/t low HR. -> now back in NSR  Continue to ambulate in the halls.   I reviewed the LVAD parameters from today, and compared the results to the patient's prior recorded data.  No programming changes were made.  The LVAD is functioning within specified parameters.  The patient performs LVAD self-test daily.  LVAD interrogation was negative for any significant power changes, alarms or PI events/speed drops.  LVAD equipment check completed and is in good working order.  Back-up equipment present.   LVAD education done on  emergency procedures and precautions and reviewed exit site care.  Length of Stay: 3  Rande Brunt NP-C 03/10/2014, 6:51 AM  VAD Team --- VAD ISSUES ONLY--- Pager [phone] (7am - 7am)  Advanced Heart Failure Team  Pager [phone] (M-F; 7a - 4p)  Please contact [facility] for night-coverage after hours (4p -7a ) and weekends on [email or web address]   Patient seen and examined with Junie Bame, NP. We discussed all aspects of the encounter. I agree with the assessment and plan as stated above.  Results of endoscopy noted. Await capsule study. CT does show cirrhotic liver features but no varices on EGD and no clinical cirrhosis. 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 Will resume coumadin. Trying to get lovenox to facilitate d/c tomorrow.VAD interrogated personally.   Ryan Knecht,MD 1:04 PM

## 2014-03-10 NOTE — Progress Notes (Signed)
ANTICOAGULATION CONSULT NOTE - Follow Up Consult  Pharmacy Consult for Heparin / Coumadin  Indication: LVAD  No Known Allergies  Patient Measurements: Height: 6' (182.9 cm) Weight: 208 lb (94.348 kg) IBW/kg (Calculated) : 77.6  Vital Signs: Temp: 98.2 F (36.8 C) (06/11 0602) Temp src: Oral (06/11 0602) Pulse Rate: 60 (06/11 0602)  Labs:  Recent Labs  03/07/14 1641 03/08/14 0106  03/09/14 0130 03/09/14 2308 03/10/14 0424  HGB 8.8* 8.7*  --  8.8*  --  9.1*  HCT 30.7* 30.1*  --  30.8*  --  31.0*  PLT 135* 137*  --  130*  --  127*  LABPROT 16.7*  --   --   --   --  15.1  INR 1.39  --   --   --   --  1.22  HEPARINUNFRC  --  0.25*  < > 0.72* 0.36 0.40  CREATININE 0.80 0.79  --  0.83  --  0.80  < > = values in this interval not displayed.  Estimated Creatinine Clearance: 99.5 ml/min (by C-G formula based on Cr of 0.8).     Assessment: 73 y/o LVAD patient on heparin drip bridge while Coumadin on hold for EGD and colonoscopy - 1 polyp removed and currently having capsule endoscopy currently.  Heparin drip restarted 6/10 pm after procedure.  Drip rate 1600 uts/hr HL 0.4 - at goal.  INR 1.2 < goal.  No over bleeding, CBC stable on po FeSo4 .  Goal of Therapy:  INR 2-2.5 Heparin level 0.3-0.7 units/ml Monitor platelets by anticoagulation protocol: Yes   Plan:  -Continue heparin at 1600 units/hr Warfarin 7.5mg  x1 -Daily CBC/HL/INR -Monitor for bleeding, trend Hgb  Bonnita Nasuti Pharm.D. CPP, BCPS Clinical Pharmacist [phone] 03/10/2014 9:46 AM

## 2014-03-11 ENCOUNTER — Encounter (HOSPITAL_COMMUNITY): Payer: Self-pay | Admitting: Gastroenterology

## 2014-03-11 ENCOUNTER — Encounter: Payer: Self-pay | Admitting: Gastroenterology

## 2014-03-11 DIAGNOSIS — K31811 Angiodysplasia of stomach and duodenum with bleeding: Secondary | ICD-10-CM

## 2014-03-11 DIAGNOSIS — Z95811 Presence of heart assist device: Secondary | ICD-10-CM

## 2014-03-11 DIAGNOSIS — I5022 Chronic systolic (congestive) heart failure: Secondary | ICD-10-CM

## 2014-03-11 LAB — CBC
HCT: 31.4 % — ABNORMAL LOW (ref 39.0–52.0)
Hemoglobin: 9.2 g/dL — ABNORMAL LOW (ref 13.0–17.0)
MCH: 31 pg (ref 26.0–34.0)
MCHC: 29.3 g/dL — AB (ref 30.0–36.0)
MCV: 105.7 fL — ABNORMAL HIGH (ref 78.0–100.0)
PLATELETS: 125 10*3/uL — AB (ref 150–400)
RBC: 2.97 MIL/uL — ABNORMAL LOW (ref 4.22–5.81)
RDW: 17.2 % — AB (ref 11.5–15.5)
WBC: 5.7 10*3/uL (ref 4.0–10.5)

## 2014-03-11 LAB — BASIC METABOLIC PANEL
BUN: 8 mg/dL (ref 6–23)
CO2: 23 meq/L (ref 19–32)
CREATININE: 0.77 mg/dL (ref 0.50–1.35)
Calcium: 9.5 mg/dL (ref 8.4–10.5)
Chloride: 104 mEq/L (ref 96–112)
GFR calc Af Amer: >90 mL/min (ref >90)
GFR calc non Af Amer: 89 mL/min — ABNORMAL LOW (ref >90)
GLUCOSE: 120 mg/dL — AB (ref 70–99)
Potassium: 4.1 mEq/L (ref 3.7–5.3)
Sodium: 139 mEq/L (ref 137–147)

## 2014-03-11 LAB — HEPARIN LEVEL (UNFRACTIONATED): Heparin Unfractionated: 0.53 IU/mL (ref 0.30–0.70)

## 2014-03-11 LAB — PRO B NATRIURETIC PEPTIDE: Pro B Natriuretic peptide (BNP): 634.7 pg/mL — ABNORMAL HIGH (ref 0–125)

## 2014-03-11 LAB — VITAMIN B12: Vitamin B-12: 944 pg/mL — ABNORMAL HIGH (ref 211–911)

## 2014-03-11 LAB — PROTIME-INR
INR: 1.36 (ref 0.00–1.49)
PROTHROMBIN TIME: 16.4 s — AB (ref 11.6–15.2)

## 2014-03-11 LAB — FOLATE RBC: RBC Folate: 557 ng/mL (ref >280)

## 2014-03-11 MED ORDER — LOSARTAN POTASSIUM 50 MG PO TABS
50.0000 mg | ORAL_TABLET | Freq: Every day | ORAL | Status: DC
  Administered 2014-03-11: 50 mg via ORAL
  Filled 2014-03-11: qty 1

## 2014-03-11 MED ORDER — ENOXAPARIN SODIUM 100 MG/ML ~~LOC~~ SOLN: 90.0000 mg | Freq: Two times a day (BID) | SUBCUTANEOUS | Status: DC

## 2014-03-11 MED ORDER — PANTOPRAZOLE SODIUM 40 MG PO TBEC: 40.0000 mg | DELAYED_RELEASE_TABLET | Freq: Two times a day (BID) | ORAL | Status: DC

## 2014-03-11 MED ORDER — WARFARIN SODIUM 5 MG PO TABS: 5.0000 mg | ORAL_TABLET | Freq: Every day | ORAL | Status: DC

## 2014-03-11 MED ORDER — ENOXAPARIN SODIUM 100 MG/ML ~~LOC~~ SOLN
90.0000 mg | Freq: Once | SUBCUTANEOUS | Status: AC
  Administered 2014-03-11: 90 mg via SUBCUTANEOUS
  Filled 2014-03-11: qty 1

## 2014-03-11 MED ORDER — WARFARIN SODIUM 7.5 MG PO TABS
7.5000 mg | ORAL_TABLET | Freq: Once | ORAL | Status: DC
  Filled 2014-03-11: qty 1

## 2014-03-11 MED ORDER — FUROSEMIDE 40 MG PO TABS: ORAL_TABLET | ORAL | Status: DC

## 2014-03-11 MED ORDER — LOSARTAN POTASSIUM 50 MG PO TABS: 50.0000 mg | ORAL_TABLET | Freq: Every day | ORAL | Status: DC

## 2014-03-11 NOTE — Discharge Summary (Signed)
Advanced Heart Failure Team  Discharge Summary   Patient ID: [name] MRN: [id number], DOB/AGE: 31-Jul-1941 73 y.o. 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 Quit smoking 2003. Uses CPAP and O2 every night. He is s/p LVAD HM II implanted 01/12/13 under DT.  He has undergone 2 transfusions in the past month for symptomatic anemia and was scheduled for admission to the hospital for bridge with heparin for EGD/colonoscopy. His coumadin was stopped and he was placed on heparin gtt. GI was consulted and he received a bowel prep and underwent an upper endoscopy and lower colonoscopy on 03/09/14. A 6 mm polyp was removed and a clip was placed to minimize the risk for post-polypectomy bleeding in the face of anticoagulation therapy. A small 2 mm polyp was also found but was left. The colonoscopy was limited d/t poor bowel prep and he was set up for a capsule study and CT to rule out cirrhosis. The CT showed cirrhosis, however no varies on EGD. The capsule study results on discharge were still pending. Serologies for viral hepatitis are negative. His Hgb remained stable and his protonix was increased to 40 mg BID. He remained on heparin and his coumadin was restarted. It was decided to continue to hold his ASA for now. On day of discharge his INR was sub-therapeutic and he was sent home with lovenox 90 mg subQ every 12 hours until next INR check next week.  During his stay he had a low HR in the 50s and his BB was stopped. He also had some intermittent afib which was controlled, but on day of discharge was back in NSR. Losartan 50 mg was added  for his MAP since he has not tolerated an ACE-I in the past d/t dizziness.   He ambulated in the halls with CR and on day of discharge denied any SOB, orthopnea or CP. His VS were stable and will be seen in the clinic next Tuesday.   LVAD Interrogation HM II:   Speed: 9200  Flow: 5.1 PI: 5.2  Power: 5.5      Discharge Weight Range: 206-210 lbs.  Discharge Vitals: Blood pressure 100/0, pulse 62, temperature 98.3 F (36.8 C), temperature source Oral, resp. rate 20, height 6' (1.829 m), weight 206 lb 12.7 oz (93.8 kg), SpO2 96.00%.  Labs: Lab Results  Component Value Date   WBC 5.7 03/11/2014   HGB 9.2* 03/11/2014   HCT 31.4* 03/11/2014   MCV 105.7* 03/11/2014   PLT 125* 03/11/2014    Recent Labs Lab 03/07/14 1641  03/11/14 0430  NA 142  < > 139  K 4.1  < > 4.1  CL 106  < > 104  CO2 26  < > 23  BUN 13  < > 8  CREATININE 0.80  < > 0.77  CALCIUM 9.7  < > 9.5  PROT 5.7*  --   --   BILITOT 0.5  --   --   ALKPHOS 55  --   --   ALT 12  --   --   AST 24  --   --   GLUCOSE 104*  < >  120*  < > = values in this interval not displayed. Lab Results  Component Value Date   CHOL 126 07/02/2012   HDL 40.80 07/02/2012   LDLCALC 58 07/02/2012   TRIG 134.0 07/02/2012   BNP (last 3 results)  Recent Labs  11/02/13 1422 01/04/14 0922 03/11/14 0430  PROBNP 959.3* 595.8* 634.7*    Diagnostic Studies/Procedures   Ct Abdomen W Contrast  03/09/2014   CLINICAL DATA:  Low platelets.  Anemia.  EXAM: CT ABDOMEN WITH CONTRAST  TECHNIQUE: Multidetector CT imaging of the abdomen was performed using the standard protocol following bolus administration of intravenous contrast.  CONTRAST:  61mL OMNIPAQUE IOHEXOL 300 MG/ML  SOLN  COMPARISON:  None.  FINDINGS: The surface of the liver is nodular. There is an abnormal ratio of the caudate lobe to the right lobe consistent with cirrhosis. There are multiple tiny stones in the gallbladder. There is no biliary ductal dilatation. The spleen is normal in size. The  pancreas, adrenal glands, and kidneys are normal except for a benign appearing 5.2 cm cyst on the posterior aspect of the upper pole the right kidney.  The bowel is normal except for a 16 mm metallic foreign body in the distal transverse colon of unknown etiology.  Left ventricular assist device is noted in place with cardiomegaly. No infiltrates or effusions at the lung bases. The patient does have multiple areas of pleural calcification in the lower chest.  IMPRESSION: 1. Cirrhosis. 2. 16 mm linear metallic foreign body in the distal transverse colon. 3. Cholelithiasis.   Electronically Signed   By: Rozetta Nunnery M.D.   On: 03/09/2014 18:37    Discharge Medications     Medication List    STOP taking these medications       metoprolol succinate 25 MG 24 hr tablet  Commonly known as:  TOPROL-XL     potassium chloride SA 20 MEQ tablet  Commonly known as:  K-DUR,KLOR-CON      TAKE these medications       albuterol (2.5 MG/3ML) 0.083% nebulizer solution  Commonly known as:  PROVENTIL  Take 2.5 mg by nebulization every 6 (six) hours as needed for wheezing or shortness of breath.     budesonide-formoterol 160-4.5 MCG/ACT inhaler  Commonly known as:  SYMBICORT  Inhale 2 puffs into the lungs 2 (two) times daily.     cholecalciferol 1000 UNITS tablet  Commonly known as:  VITAMIN D  Take 2,000 Units by mouth at bedtime. Take 2 capsules hs     citalopram 40 MG tablet  Commonly known as:  CELEXA  Take 40 mg by mouth at bedtime.     cyanocobalamin 1000 MCG tablet  Take 1,000 mcg by mouth at bedtime.     docusate sodium 100 MG capsule  Commonly known as:  COLACE  Take 200 mg by mouth at bedtime.     enoxaparin 100 MG/ML injection  Commonly known as:  LOVENOX  Inject 0.9 mLs (90 mg total) into the skin every 12 (twelve) hours.     ferrous gluconate 324 MG tablet  Commonly known as:  FERGON  Take 324 mg by mouth 3 (three) times daily with meals.     furosemide 40 MG tablet   Commonly known as:  LASIX  Take 40 mg on days that your weight is greater than 220 lbs.     levothyroxine 100 MCG tablet  Commonly known as:  SYNTHROID, LEVOTHROID  Take 50 mcg by mouth at bedtime.  losartan 50 MG tablet  Commonly known as:  COZAAR  Take 1 tablet (50 mg total) by mouth daily.     nitroGLYCERIN 0.4 MG SL tablet  Commonly known as:  NITROSTAT  Place 0.4 mg under the tongue every 5 (five) minutes as needed.     pantoprazole 40 MG tablet  Commonly known as:  PROTONIX  Take 1 tablet (40 mg total) by mouth 2 (two) times daily.     simvastatin 80 MG tablet  Commonly known as:  ZOCOR  Take 40 mg by mouth at bedtime.     tiotropium 18 MCG inhalation capsule  Commonly known as:  SPIRIVA  Place 18 mcg into inhaler and inhale daily.     vitamin C 500 MG tablet  Commonly known as:  ASCORBIC ACID  Take 500 mg by mouth 2 (two) times daily.     warfarin 5 MG tablet  Commonly known as:  COUMADIN  Take 1 tablet (5 mg total) by mouth daily.  Start taking on:  03/13/2014        Disposition   The patient will be discharged in stable condition to home. Discharge Instructions   ACE Inhibitor / ARB already ordered    Complete by:  As directed      Contraindication beta blocker-discharge    Complete by:  As directed      Diet - low sodium heart healthy    Complete by:  As directed      Discharge instructions    Complete by:  As directed   1) Coumadin dosing for now take 7.5 mg (1 1/2 tablets) on Saturday and then go back to regular home dosing 5 mg (1 tablet) daily.  2) Please bring all your medications to your visit.     Heart Failure patients record your daily weight using the same scale at the same time of day    Complete by:  As directed      Increase activity slowly    Complete by:  As directed      STOP any activity that causes chest pain, shortness of breath, dizziness, sweating, or exessive weakness    Complete by:  As directed           Follow-up  Information   Follow up with [facility] On 03/15/2014. (@ 11:00 am. Gate code 0400; Please bring all medications to your visit. )    Specialty:  Cardiology   Contact information:   [address] [id number] [place]  [phone]        Duration of Discharge Encounter: Greater than 35 minutes   Signed, Rande Brunt  03/11/2014, 4:30 PM  Patient seen and examined with Junie Bame, NP. We discussed all aspects of the encounter. I agree with the assessment and plan as stated above. He is stable for d/c with lovenox bridge. Await results of capsule endo.   Benay Spice 8:26 PM

## 2014-03-11 NOTE — Progress Notes (Signed)
Discharged to home with family office visits in place teaching done  

## 2014-03-11 NOTE — Progress Notes (Signed)
ANTICOAGULATION CONSULT NOTE - Follow Up Consult  Pharmacy Consult for Heparin / Coumadin  Indication: LVAD  No Known Allergies  Patient Measurements: Height: 6' (182.9 cm) Weight: 206 lb 12.7 oz (93.8 kg) IBW/kg (Calculated) : 77.6  Vital Signs: Temp: 98.3 F (36.8 C) (06/12 0600) Temp src: Oral (06/11 2206) Pulse Rate: 63 (06/11 2142)  Labs:  Recent Labs  03/09/14 0130 03/09/14 2308 03/10/14 0424 03/11/14 0430  HGB 8.8*  --  9.1* 9.2*  HCT 30.8*  --  31.0* 31.4*  PLT 130*  --  127* 125*  LABPROT  --   --  15.1 16.4*  INR  --   --  1.22 1.36  HEPARINUNFRC 0.72* 0.36 0.40 0.53  CREATININE 0.83  --  0.80 0.77    Estimated Creatinine Clearance: 99.3 ml/min (by C-G formula based on Cr of 0.77).     Assessment: 73 y/o LVAD patient on heparin drip bridge while Coumadin on hold for EGD and colonoscopy - 1 polyp removed and s/p capsule endoscopy yesterday - results pending.   Heparin drip restarted 6/10 pm after procedure.  Drip rate 1600 uts/hr HL 0.53- at goal.  INR 1.36 < goal slow trend up.  No over bleeding, CBC stable on po FeSo4 .  Goal of Therapy:  INR 2-2.5 Heparin level 0.3-0.7 units/ml Monitor platelets by anticoagulation protocol: Yes   Plan:  -Continue heparin at 1600 units/hr Warfarin 7.5mg  x1 -Daily CBC/HL/INR -Monitor for bleeding, trend Hgb  Bonnita Nasuti Pharm.D. CPP, BCPS Clinical Pharmacist [phone] 03/11/2014 7:28 AM

## 2014-03-11 NOTE — Progress Notes (Signed)
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 Quit smoking 2003. Uses CPAP and O2 every night. He is s/p LVAD HM II implanted 01/12/13 under [facility].  He has needed to come to short stay 2 times in the past month for transfusions. He has needed a total of 4PRBC last. He received his last 2 PRBC last week and ASA was stopped. Admitted 6/8 for bridge with heparin for EGD/colonoscopy.  Went for upper GI and lower GI scope 6/10 and procedure limited d/t poor bowel prep. Sessile polyp 6 mm size in descending colon was clipped. Underwent capsule study yesterday and are awaiting results. Denies SOB, orthopnea or CP. Cardiac rehab reported patient was more SOB with walking yesterday. Remains on heparin gtt. Back in NSR.  LVAD INTERROGATION:  HeartMate II LVAD:  Flow 5.1 liters/min, speed 9200, power 5.5, PI 5.2.  No PI events  Objective:    Vital Signs:   Temp:  [97.5 F (36.4 C)-98.3 F (36.8 C)] 98.3 F (36.8 C) (06/12 0600) Pulse Rate:  [63] 63 (06/11 2142) Resp:  [20] 20 (06/11 2142) SpO2:  [95 %-96 %] 95 % (06/12 0600) Weight:  [206 lb 12.7 oz (93.8 kg)-208 lb (94.348 kg)] 206 lb 12.7 oz (93.8 kg) (06/12 0436) Last BM Date: 03/09/14 Mean arterial Pressure 88-98  Intake/Output:   Intake/Output Summary (Last 24 hours) at 03/11/14 0715 Last data filed at 03/11/14 0649  Gross per 24 hour  Intake    992 ml  Output   2300 ml  Net  -1308 ml     Physical Exam: General: Well appearing. No resp difficulty, lying flat in bed  HEENT: normal  Neck: supple. JVP flat . Carotids 2+ bilat; no bruits. No lymphadenopathy or thryomegaly appreciated.  Cor: Mechanical heart sounds with LVAD hum present.  Lungs: clear  Abdomen: soft, nontender, nondistended. No hepatosplenomegaly. No bruits or masses. Good bowel sounds.  Driveline: C/D/I;  securement device intact and driveline incorporated  Extremities: no cyanosis, clubbing, rash, edema  Neuro: alert & orientedx3, cranial nerves grossly intact. moves all 4 extremities w/o difficulty. Affect pleasant  Telemetry: NSR 60s   Labs: Basic Metabolic Panel:  Recent Labs Lab 03/07/14 1641 03/08/14 0106 03/09/14 0130 03/10/14 0424 03/11/14 0430  NA 142 141 143 141 139  K 4.1 4.0 4.9 4.5 4.1  CL 106 106 107 103 104  CO2 26 26 28 24 23   GLUCOSE 104* 124* 103* 91 120*  BUN 13 12 10 8 8   CREATININE 0.80 0.79 0.83 0.80 0.77  CALCIUM 9.7 9.3 9.3 9.7 9.5    Liver Function Tests:  Recent Labs Lab 03/07/14 1641  AST 24  ALT 12  ALKPHOS 55  BILITOT 0.5  PROT 5.7*  ALBUMIN 3.1*   No results found for this basename: LIPASE, AMYLASE,  in the last 168 hours No results found for this basename: AMMONIA,  in the last 168 hours  CBC:  Recent Labs Lab 03/07/14 1641 03/08/14 0106 03/09/14 0130 03/10/14 0424 03/11/14 0430  WBC 6.3 7.0 6.9 6.2 5.7  NEUTROABS 4.8  --   --   --   --   HGB 8.8* 8.7* 8.8* 9.1* 9.2*  HCT 30.7* 30.1* 30.8* 31.0* 31.4*  MCV 108.1* 108.7* 107.7* 106.9* 105.7*  PLT 135* 137* 130* 127* 125*    INR:  Recent Labs Lab 03/07/14  1641 03/10/14 0424 03/11/14 0430  INR 1.39 1.22 1.36    Other results:  EKG:   Imaging: Ct Abdomen W Contrast  03/09/2014   CLINICAL DATA:  Low platelets.  Anemia.  EXAM: CT ABDOMEN WITH CONTRAST  TECHNIQUE: Multidetector CT imaging of the abdomen was performed using the standard protocol following bolus administration of intravenous contrast.  CONTRAST:  42mL OMNIPAQUE IOHEXOL 300 MG/ML  SOLN  COMPARISON:  None.  FINDINGS: The surface of the liver is nodular. There is an abnormal ratio of the caudate lobe to the right lobe consistent with cirrhosis. There are multiple tiny stones in the gallbladder. There is no biliary ductal dilatation. The spleen is normal in size. The pancreas, adrenal glands, and kidneys are  normal except for a benign appearing 5.2 cm cyst on the posterior aspect of the upper pole the right kidney.  The bowel is normal except for a 16 mm metallic foreign body in the distal transverse colon of unknown etiology.  Left ventricular assist device is noted in place with cardiomegaly. No infiltrates or effusions at the lung bases. The patient does have multiple areas of pleural calcification in the lower chest.  IMPRESSION: 1. Cirrhosis. 2. 16 mm linear metallic foreign body in the distal transverse colon. 3. Cholelithiasis.   Electronically Signed   By: Rozetta Nunnery M.D.   On: 03/09/2014 18:37     Medications:     Scheduled Medications: . budesonide-formoterol  2 puff Inhalation BID  . citalopram  40 mg Oral Daily  . docusate sodium  200 mg Oral Daily  . ferrous gluconate  324 mg Oral TID WC  . levothyroxine  50 mcg Oral QAC breakfast  . losartan  25 mg Oral Daily  . pantoprazole  40 mg Oral Daily  . simvastatin  40 mg Oral q1800  . tiotropium  18 mcg Inhalation Daily  . Warfarin - Pharmacist Dosing Inpatient   Does not apply q1800    Infusions: . heparin 1,600 Units/hr (03/10/14 1624)    PRN Medications: acetaminophen, acetaminophen   Assessment:   1) Anemia  2) ?GIB  3) Chronic systolic heart failure  - s/p LVAD 12/2012  4) PAF  5) Depression 6) Cirrhosis  Plan/Discussion:    [name] was admitted 6/8 for bridge with heparin for his LVAD in order for him to undergo upper/lower endoscopy. He has needed multiple transfusions in the past month and suspect that he has AVMs. No ASA and coumadin on hold.   Went for upper and lower GI 6/10 which was limited d/t poor bowel prep. CT showed cirrhosis of liver. Capsule study yesterday and awaiting results.   INR slowly trending up, 1.36. Patient remains on heparin gtt. He is adamant about wanting to go home for the weekend. Will get [facility]enox [facility] and then will need INR Monday and follow up in HF clinic next  week. Folate and B12 pending.  MAP 80-98 will increase losartan to 50 mg daily.  Hgb stable but on the low side. Will discuss with Dr. Haroldine Laws about giving 1 PRBC before discharge to give patient a cushion if he does have slow bleed somewhere. Will no longer be on ASA.   I reviewed the LVAD parameters from today, and compared the results to the patient's prior recorded data.  No programming changes were made.  The LVAD is functioning within specified parameters.  The patient performs LVAD self-test daily.  LVAD interrogation was negative for any significant power changes, alarms or  PI events/speed drops.  LVAD equipment check completed and is in good working order.  Back-up equipment present.   LVAD education done on emergency procedures and precautions and reviewed exit site care.  Length of Stay: 4  Rande Brunt NP-C 03/11/2014, 7:15 AM  VAD Team --- VAD ISSUES ONLY--- Pager [phone] (7am - 7am)  Advanced Heart Failure Team  Pager [phone] (M-F; 7a - 4p)  Please contact [facility] for night-coverage after hours (4p -7a ) and weekends on [email or web address]   Patient seen and examined with Ryan Bame, NP. We discussed all aspects of the encounter. I agree with the assessment and plan as stated above.   Looks good. Agree with home today with lovenox bridge. Stop b-blocker due to bradycardia. INR check Tuesday.   VAD parameters stable.   Daniel Bensimhon,MD 11:54 AM

## 2014-03-11 NOTE — Progress Notes (Signed)
No new GI complaints. Hg stable.  Plan d/c this am. Capsule results are pending.  Serologies for viral hepatitis are negative. No source for GI bleeding was identified.  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

## 2014-03-15 ENCOUNTER — Other Ambulatory Visit (HOSPITAL_COMMUNITY): Payer: Self-pay | Admitting: *Deleted

## 2014-03-15 ENCOUNTER — Ambulatory Visit (HOSPITAL_COMMUNITY): Payer: Self-pay | Admitting: *Deleted

## 2014-03-15 ENCOUNTER — Ambulatory Visit (HOSPITAL_COMMUNITY)
Admit: 2014-03-15 | Discharge: 2014-03-15 | Disposition: A | Discharge status: Home / Self Care | Payer: Medicare Other | Attending: Internal Medicine | Admitting: Internal Medicine

## 2014-03-15 VITALS — BP 90/1 | HR 65 | Wt 223.5 lb

## 2014-03-15 DIAGNOSIS — I472 Ventricular tachycardia, unspecified: Secondary | ICD-10-CM | POA: Insufficient documentation

## 2014-03-15 DIAGNOSIS — I509 Heart failure, unspecified: Secondary | ICD-10-CM

## 2014-03-15 DIAGNOSIS — Z79899 Other long term (current) drug therapy: Secondary | ICD-10-CM | POA: Insufficient documentation

## 2014-03-15 DIAGNOSIS — I10 Essential (primary) hypertension: Secondary | ICD-10-CM

## 2014-03-15 DIAGNOSIS — G4733 Obstructive sleep apnea (adult) (pediatric): Secondary | ICD-10-CM | POA: Insufficient documentation

## 2014-03-15 DIAGNOSIS — I255 Ischemic cardiomyopathy: Secondary | ICD-10-CM

## 2014-03-15 DIAGNOSIS — E669 Obesity, unspecified: Secondary | ICD-10-CM | POA: Insufficient documentation

## 2014-03-15 DIAGNOSIS — Z95811 Presence of heart assist device: Secondary | ICD-10-CM

## 2014-03-15 DIAGNOSIS — I2510 Atherosclerotic heart disease of native coronary artery without angina pectoris: Secondary | ICD-10-CM | POA: Insufficient documentation

## 2014-03-15 DIAGNOSIS — Z95810 Presence of automatic (implantable) cardiac defibrillator: Secondary | ICD-10-CM | POA: Insufficient documentation

## 2014-03-15 DIAGNOSIS — I2589 Other forms of chronic ischemic heart disease: Secondary | ICD-10-CM | POA: Diagnosis not present

## 2014-03-15 DIAGNOSIS — I5022 Chronic systolic (congestive) heart failure: Secondary | ICD-10-CM | POA: Diagnosis not present

## 2014-03-15 DIAGNOSIS — Z7901 Long term (current) use of anticoagulants: Secondary | ICD-10-CM | POA: Insufficient documentation

## 2014-03-15 DIAGNOSIS — J438 Other emphysema: Secondary | ICD-10-CM | POA: Insufficient documentation

## 2014-03-15 DIAGNOSIS — D649 Anemia, unspecified: Secondary | ICD-10-CM | POA: Insufficient documentation

## 2014-03-15 DIAGNOSIS — Z9989 Dependence on other enabling machines and devices: Secondary | ICD-10-CM | POA: Insufficient documentation

## 2014-03-15 DIAGNOSIS — F329 Major depressive disorder, single episode, unspecified: Secondary | ICD-10-CM | POA: Insufficient documentation

## 2014-03-15 DIAGNOSIS — I4729 Other ventricular tachycardia: Secondary | ICD-10-CM | POA: Insufficient documentation

## 2014-03-15 DIAGNOSIS — Z8679 Personal history of other diseases of the circulatory system: Secondary | ICD-10-CM | POA: Insufficient documentation

## 2014-03-15 DIAGNOSIS — E039 Hypothyroidism, unspecified: Secondary | ICD-10-CM | POA: Insufficient documentation

## 2014-03-15 DIAGNOSIS — Z09 Encounter for follow-up examination after completed treatment for conditions other than malignant neoplasm: Secondary | ICD-10-CM | POA: Insufficient documentation

## 2014-03-15 DIAGNOSIS — K7460 Unspecified cirrhosis of liver: Secondary | ICD-10-CM | POA: Insufficient documentation

## 2014-03-15 DIAGNOSIS — F3289 Other specified depressive episodes: Secondary | ICD-10-CM | POA: Insufficient documentation

## 2014-03-15 DIAGNOSIS — Z951 Presence of aortocoronary bypass graft: Secondary | ICD-10-CM | POA: Insufficient documentation

## 2014-03-15 DIAGNOSIS — I252 Old myocardial infarction: Secondary | ICD-10-CM | POA: Insufficient documentation

## 2014-03-15 DIAGNOSIS — Z87891 Personal history of nicotine dependence: Secondary | ICD-10-CM | POA: Insufficient documentation

## 2014-03-15 DIAGNOSIS — I4891 Unspecified atrial fibrillation: Secondary | ICD-10-CM

## 2014-03-15 DIAGNOSIS — D509 Iron deficiency anemia, unspecified: Secondary | ICD-10-CM

## 2014-03-15 DIAGNOSIS — E785 Hyperlipidemia, unspecified: Secondary | ICD-10-CM | POA: Insufficient documentation

## 2014-03-15 LAB — CBC
HCT: 34.6 % — ABNORMAL LOW (ref 39.0–52.0)
HEMOGLOBIN: 10.2 g/dL — AB (ref 13.0–17.0)
MCH: 30.4 pg (ref 26.0–34.0)
MCHC: 29.5 g/dL — ABNORMAL LOW (ref 30.0–36.0)
MCV: 103.3 fL — AB (ref 78.0–100.0)
Platelets: 144 10*3/uL — ABNORMAL LOW (ref 150–400)
RBC: 3.35 MIL/uL — ABNORMAL LOW (ref 4.22–5.81)
RDW: 16.7 % — AB (ref 11.5–15.5)
WBC: 5.6 10*3/uL (ref 4.0–10.5)

## 2014-03-15 LAB — BASIC METABOLIC PANEL
BUN: 13 mg/dL (ref 6–23)
CHLORIDE: 105 meq/L (ref 96–112)
CO2: 22 meq/L (ref 19–32)
Calcium: 9.7 mg/dL (ref 8.4–10.5)
Creatinine, Ser: 0.73 mg/dL (ref 0.50–1.35)
GFR calc non Af Amer: >90 mL/min (ref >90)
GLUCOSE: 98 mg/dL (ref 70–99)
Potassium: 4.2 mEq/L (ref 3.7–5.3)
Sodium: 139 mEq/L (ref 137–147)

## 2014-03-15 LAB — LACTATE DEHYDROGENASE: LDH: 331 U/L — ABNORMAL HIGH (ref 94–250)

## 2014-03-15 LAB — PROTIME-INR
INR: 1.98 — ABNORMAL HIGH (ref 0.00–1.49)
PROTHROMBIN TIME: 21.9 s — AB (ref 11.6–15.2)

## 2014-03-15 MED ORDER — WARFARIN SODIUM 5 MG PO TABS: ORAL_TABLET | ORAL | Status: DC

## 2014-03-15 NOTE — Patient Instructions (Addendum)
Doing well  Take 7.5 mg coumadin tonight and then go back to 5 mg daily.   Follow up in 2 months.  Do the following things EVERYDAY: 1) Weigh yourself in the morning before breakfast. Write it down and keep it in a log. 2) Take your medicines as prescribed 3) Eat low salt foods-Limit salt (sodium) to 2000 mg per day.  4) Stay as active as you can everyday 5) Limit all fluids for the day to less than 2 liters 6)

## 2014-03-15 NOTE — Progress Notes (Signed)
Patient ID: [name], male   DOB: September 26, 1941, 73 y.o.   MRN: [id number]  [facility] [place] (Dr. 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 Quit smoking 2003. Uses CPAP and O2 every night.   He is s/p LVAD HM II implanted 01/12/13 under DT criteria  Admitted to [facility] 09/13/13 - 09/14/13 for increased fatigue and dyspnea. Found to have mild CHF and anemia. Diuresed with IV lasix and transitioned to PO lasix 40 mg twice a week. Discharge weight 203 lbs on inpatient scale.  Admitting labs revealed Hgb 7.7 for which he received 2 units PCs with appropriate rise in Hgb.   Admit 6/8-6/12 for symptomatic anemia. Had EGD/Colonoscopy which was unrevealing.  6 mm polyp clipped and removed. CT abdomen showed cirrhosis. Placed on heparin bridge. Capsule study pending. Sent home on lovenox. D/C weight 206 lbs.   [facility]:  Admitted to the hospital last week for planned bridge with heparin for EGD/colonoscopy which was normal. CT of abdomen showed cirrhosis and capsule study pending. Sent home on lovenox. Denies any bleeding issues, fatigue, SOB or orthopnea. Weight stable at home 218-222 lbs. Compliant with medications. Trying to follow low salt diet and drink less than 2L a day.    Denies LVAD alarms. Denies driveline trauma, erythema or drainage.  Denies ICD shocks. Reports taking Coumadin as prescribed and adherence to anticoagulation based dietary restrictions. Denies bright red blood per rectum or melena, no dark urine or hematuria.   Past Medical History  Diagnosis Date  . Ischemic cardiomyopathy      CABG 2003, PCI 2007  EF 27%(myoview 2012  . Chronic systolic heart failure   . Hyperlipidemia   . Hypothyroidism   . Chronic anticoagulation     Afib and LVAD  . Obesity   . COPD  (chronic obstructive pulmonary disease)   . Asbestosis(501)     "6 years in the [place]" (05/24/2013)  . Atrial fibrillation     permanent  . Paroxysmal ventricular tachycardia   . Coronary artery disease   . Implantable cardioverter-defibrillator Medtronic   . Hypertension   . Myocardial infarction 1990's-2000    "2 in  ~ the 1990's; 1 in ~ 2000" (05/24/2013)  . OSA on CPAP   . Depression   . LVAD (left ventricular assist device) present 12/2012    Current Outpatient Prescriptions  Medication Sig Dispense Refill  . albuterol (PROVENTIL) (2.5 MG/3ML) 0.083% nebulizer solution Take 2.5 mg by nebulization every 6 (six) hours as needed for wheezing or shortness of breath.       . budesonide-formoterol (SYMBICORT) 160-4.5 MCG/ACT inhaler Inhale 2 puffs into the lungs 2 (two) times daily.      . cholecalciferol (VITAMIN D) 1000 UNITS tablet Take 2,000 Units by mouth at bedtime. Take 2 capsules hs      . citalopram (CELEXA) 40 MG tablet Take 40 mg by mouth at bedtime.      . cyanocobalamin 1000 MCG tablet Take 1,000 mcg by mouth at bedtime.       . docusate sodium (COLACE) 100 MG capsule Take 200 mg by mouth at bedtime.       . enoxaparin (LOVENOX) 100 MG/ML injection Inject 0.9 mLs (90 mg total) into the skin every 12 (twelve) hours.  20 Syringe  0  .  ferrous gluconate (FERGON) 324 MG tablet Take 324 mg by mouth 3 (three) times daily with meals.      . furosemide (LASIX) 40 MG tablet Take 40 mg on days that your weight is greater than 220 lbs.  30 tablet  3  . levothyroxine (SYNTHROID, LEVOTHROID) 100 MCG tablet Take 50 mcg by mouth at bedtime.       [name] losartan (COZAAR) 50 MG tablet Take 1 tablet (50 mg total) by mouth daily.  30 tablet  3  . nitroGLYCERIN (NITROSTAT) 0.4 MG SL tablet Place 0.4 mg under the tongue every 5 (five) minutes as needed.        . pantoprazole (PROTONIX) 40 MG tablet Take 1 tablet (40 mg total) by mouth 2 (two) times daily.  60 tablet  3  . simvastatin (ZOCOR) 80 MG  tablet Take 40 mg by mouth at bedtime.        [name] tiotropium (SPIRIVA) 18 MCG inhalation capsule Place 18 mcg into inhaler and inhale daily.       . vitamin C (ASCORBIC ACID) 500 MG tablet Take 500 mg by mouth 2 (two) times daily.       [name] warfarin (COUMADIN) 5 MG tablet Take 1 tablet (5 mg total) by mouth daily.  30 tablet  3   No current facility-administered medications for this encounter.    Review of patient's allergies indicates no known allergies.  REVIEW OF SYSTEMS: All systems negative except as listed in HPI, PMH and Problem list.   LVAD interrogation reveals:  Speed: 9200 Flow:  5.5 Power:  5.7 PI:  5.4 Alarms:  No alarms Events:  Rare PI event Fixed speed:  9200 Low speed limit: 8600  I reviewed the LVAD parameters from today, and compared the results to the patient's prior recorded data.  No programming changes were made.  The LVAD is functioning within specified parameters.  The patient performs LVAD self-test daily.  LVAD interrogation was negative for any significant power changes or alarms. There were a few PI events daily, but nothing concerning and none back to back. LVAD equipment check completed and is in good working order.  Back-up equipment present.   LVAD education done on emergency procedures and precautions and reviewed exit site care.  Filed Vitals:   03/15/14 1122  BP: 90/1  Pulse: 65  Weight: 223 lb 8 oz (101.379 kg)  SpO2: 96%    Physical Exam: GENERAL: Well appearing, male; NAD; wife present HEENT: normal  NECK: Supple, JVP flat. 2+ bilaterally, no bruits.  No lymphadenopathy or thyromegaly appreciated.   CARDIAC:  Mechanical heart sounds with LVAD hum present.  LUNGS:  Clear to auscultation bilaterally.  ABDOMEN:  Soft, round, nontender, positive bowel sounds x4.     LVAD exit site: well-healed and incorporated.  Dressing dry and intact.  No erythema, drainage, odor or tenderness.  Stabilization device present and accurately applied.  Driveline  dressing is being changed daily per sterile technique. Changed in clinic EXTREMITIES:  Warm and dry, no cyanosis, clubbing, no edema NEUROLOGIC:  Alert and oriented x 4.  Gait steady.  No aphasia.  No dysarthria.  Affect pleasant.      ASSESSMENT AND PLAN:   1) Chronic Systolic HF: s/p LVAD implant 12/2012 - NYHA II symptoms and volume status stable. Will continue lasix 40 mg only if weight greater than 220 lbs on home scale. - In the hospital stopped BB d/t bradycardia and started losartan 50 mg daily which he is tolerating.  HR 60s. MAP stable.  - Reinforced the need and importance of daily weights, a low sodium diet, and fluid restriction (less than 2 L a day). Instructed to call the HF clinic if weight increases more than 3 lbs overnight or 5 lbs in a week.  2)  Anticoagulation management  -INR 1.98. Stop lovenox and take 7.5 mg Coumadin tonight and then go back to 5 mg daily except Tuesdays take 7.5 mg. No ASA with recent bleeding. INR goal 2.0-2.5 3) Anemia - Hgb slowing improving. Will continue to follow. Continue iron supplementation and no longer on ASA. 4 LVAD - No issues. All parameters within nl limits.  - CBC, BMET, LDH and INR good. 5. PAF - on Coumadin. BB stopped in the hospital d/t bradycardia.   6. Depression - Stable. He is in much happier spirits today and is planning on going camping next week. Continue Celexa 40 mg dailyl   F/U 2 months Ryan Bame B NP-C  11:26 AM

## 2014-03-25 ENCOUNTER — Encounter: Payer: Medicare Other | Admitting: Gastroenterology

## 2014-03-28 ENCOUNTER — Ambulatory Visit (INDEPENDENT_AMBULATORY_CARE_PROVIDER_SITE_OTHER): Payer: Non-veteran care | Admitting: *Deleted

## 2014-03-28 ENCOUNTER — Ambulatory Visit (HOSPITAL_COMMUNITY): Payer: Self-pay | Admitting: *Deleted

## 2014-03-28 DIAGNOSIS — Z95811 Presence of heart assist device: Secondary | ICD-10-CM

## 2014-03-28 DIAGNOSIS — Z7901 Long term (current) use of anticoagulants: Secondary | ICD-10-CM

## 2014-03-28 DIAGNOSIS — D500 Iron deficiency anemia secondary to blood loss (chronic): Secondary | ICD-10-CM

## 2014-03-28 LAB — PROTIME-INR
INR: 2.1 ratio — ABNORMAL HIGH (ref 0.8–1.0)
Prothrombin Time: 23.2 s — ABNORMAL HIGH (ref 9.6–13.1)

## 2014-03-29 NOTE — Patient Instructions (Signed)
Will check CBC next INR visit

## 2014-04-06 ENCOUNTER — Other Ambulatory Visit (HOSPITAL_COMMUNITY): Payer: Self-pay | Admitting: *Deleted

## 2014-04-06 MED ORDER — LOSARTAN POTASSIUM 50 MG PO TABS: 50.0000 mg | ORAL_TABLET | Freq: Every day | ORAL | Status: DC

## 2014-04-06 NOTE — Telephone Encounter (Addendum)
Per pt's wife rx for Losartan sent to [facility] and faxed to Dr Tammi Klippel at [place] at [phone]

## 2014-04-08 ENCOUNTER — Encounter: Payer: Self-pay | Admitting: Gastroenterology

## 2014-04-11 ENCOUNTER — Ambulatory Visit (INDEPENDENT_AMBULATORY_CARE_PROVIDER_SITE_OTHER): Payer: Non-veteran care | Admitting: *Deleted

## 2014-04-11 ENCOUNTER — Ambulatory Visit: Payer: Medicare Other | Admitting: Internal Medicine

## 2014-04-11 ENCOUNTER — Ambulatory Visit (HOSPITAL_COMMUNITY): Payer: Self-pay | Admitting: *Deleted

## 2014-04-11 DIAGNOSIS — Z95811 Presence of heart assist device: Secondary | ICD-10-CM | POA: Diagnosis not present

## 2014-04-11 DIAGNOSIS — Z7901 Long term (current) use of anticoagulants: Secondary | ICD-10-CM

## 2014-04-11 DIAGNOSIS — D500 Iron deficiency anemia secondary to blood loss (chronic): Secondary | ICD-10-CM

## 2014-04-11 LAB — CBC WITH DIFFERENTIAL/PLATELET
BASOS PCT: 0.3 % (ref 0.0–3.0)
Basophils Absolute: 0 10*3/uL (ref 0.0–0.1)
EOS PCT: 3.8 % (ref 0.0–5.0)
Eosinophils Absolute: 0.3 10*3/uL (ref 0.0–0.7)
HEMATOCRIT: 33.9 % — AB (ref 39.0–52.0)
HEMOGLOBIN: 10.8 g/dL — AB (ref 13.0–17.0)
Lymphocytes Relative: 12 % (ref 12.0–46.0)
Lymphs Abs: 0.9 10*3/uL (ref 0.7–4.0)
MCHC: 31.8 g/dL (ref 30.0–36.0)
MCV: 100.9 fl — ABNORMAL HIGH (ref 78.0–100.0)
Monocytes Absolute: 0.4 10*3/uL (ref 0.1–1.0)
Monocytes Relative: 6 % (ref 3.0–12.0)
NEUTROS ABS: 5.7 10*3/uL (ref 1.4–7.7)
Neutrophils Relative %: 77.9 % — ABNORMAL HIGH (ref 43.0–77.0)
Platelets: 152 10*3/uL (ref 150.0–400.0)
RBC: 3.36 Mil/uL — ABNORMAL LOW (ref 4.22–5.81)
RDW: 17.6 % — ABNORMAL HIGH (ref 11.5–15.5)
WBC: 7.3 10*3/uL (ref 4.0–10.5)

## 2014-04-11 LAB — PROTIME-INR
INR: 2.2 ratio — ABNORMAL HIGH (ref 0.8–1.0)
Prothrombin Time: 24 s — ABNORMAL HIGH (ref 9.6–13.1)

## 2014-04-28 ENCOUNTER — Ambulatory Visit (HOSPITAL_COMMUNITY): Payer: Self-pay | Admitting: *Deleted

## 2014-04-28 ENCOUNTER — Ambulatory Visit (INDEPENDENT_AMBULATORY_CARE_PROVIDER_SITE_OTHER): Payer: Non-veteran care | Admitting: *Deleted

## 2014-04-28 DIAGNOSIS — Z7901 Long term (current) use of anticoagulants: Secondary | ICD-10-CM

## 2014-04-28 DIAGNOSIS — Z95811 Presence of heart assist device: Secondary | ICD-10-CM

## 2014-04-28 LAB — PROTIME-INR
INR: 2.5 ratio — ABNORMAL HIGH (ref 0.8–1.0)
PROTHROMBIN TIME: 27.5 s — AB (ref 9.6–13.1)

## 2014-05-09 ENCOUNTER — Telehealth: Payer: Self-pay | Admitting: Cardiology

## 2014-05-09 ENCOUNTER — Ambulatory Visit (INDEPENDENT_AMBULATORY_CARE_PROVIDER_SITE_OTHER): Payer: Non-veteran care | Admitting: *Deleted

## 2014-05-09 DIAGNOSIS — I255 Ischemic cardiomyopathy: Secondary | ICD-10-CM

## 2014-05-09 DIAGNOSIS — I2589 Other forms of chronic ischemic heart disease: Secondary | ICD-10-CM

## 2014-05-09 LAB — MDC_IDC_ENUM_SESS_TYPE_REMOTE
Brady Statistic RV Percent Paced: 0.06 %
Date Time Interrogation Session: 20150810180814
HighPow Impedance: 228 Ohm
HighPow Impedance: 285 Ohm
HighPow Impedance: 38 Ohm
HighPow Impedance: 48 Ohm
Lead Channel Impedance Value: 399 Ohm
Lead Channel Sensing Intrinsic Amplitude: 8.125 mV
Lead Channel Sensing Intrinsic Amplitude: 8.125 mV
Lead Channel Setting Pacing Amplitude: 2.5 V
MDC IDC MSMT BATTERY VOLTAGE: 3.06 V
MDC IDC SET LEADCHNL RV PACING PULSEWIDTH: 0.4 ms
MDC IDC SET LEADCHNL RV SENSING SENSITIVITY: 0.3 mV
MDC IDC SET ZONE DETECTION INTERVAL: 310 ms
MDC IDC SET ZONE DETECTION INTERVAL: 410 ms
Zone Setting Detection Interval: 250 ms

## 2014-05-09 NOTE — Telephone Encounter (Signed)
Spoke with pt and reminded pt of remote transmission that is due today. Pt verbalized understanding.   

## 2014-05-09 NOTE — Progress Notes (Signed)
Remote ICD transmission.   

## 2014-05-13 ENCOUNTER — Other Ambulatory Visit (HOSPITAL_COMMUNITY): Payer: Self-pay | Admitting: *Deleted

## 2014-05-13 DIAGNOSIS — Z7901 Long term (current) use of anticoagulants: Secondary | ICD-10-CM

## 2014-05-13 DIAGNOSIS — Z95811 Presence of heart assist device: Secondary | ICD-10-CM

## 2014-05-17 ENCOUNTER — Ambulatory Visit (HOSPITAL_COMMUNITY): Payer: Self-pay | Admitting: Adult Health

## 2014-05-17 ENCOUNTER — Telehealth (HOSPITAL_COMMUNITY): Payer: Self-pay | Admitting: Vascular Surgery

## 2014-05-17 ENCOUNTER — Ambulatory Visit (HOSPITAL_COMMUNITY)
Admission: RE | Admit: 2014-05-17 | Discharge: 2014-05-17 | Disposition: A | Discharge status: Home / Self Care | Payer: Medicare Other | Source: Ambulatory Visit | Attending: Internal Medicine | Admitting: Internal Medicine

## 2014-05-17 VITALS — HR 79 | Wt 225.0 lb

## 2014-05-17 DIAGNOSIS — D649 Anemia, unspecified: Secondary | ICD-10-CM | POA: Insufficient documentation

## 2014-05-17 DIAGNOSIS — I472 Ventricular tachycardia, unspecified: Secondary | ICD-10-CM | POA: Insufficient documentation

## 2014-05-17 DIAGNOSIS — Z951 Presence of aortocoronary bypass graft: Secondary | ICD-10-CM | POA: Insufficient documentation

## 2014-05-17 DIAGNOSIS — F329 Major depressive disorder, single episode, unspecified: Secondary | ICD-10-CM | POA: Insufficient documentation

## 2014-05-17 DIAGNOSIS — E039 Hypothyroidism, unspecified: Secondary | ICD-10-CM | POA: Insufficient documentation

## 2014-05-17 DIAGNOSIS — Z95811 Presence of heart assist device: Secondary | ICD-10-CM | POA: Insufficient documentation

## 2014-05-17 DIAGNOSIS — I509 Heart failure, unspecified: Secondary | ICD-10-CM | POA: Diagnosis not present

## 2014-05-17 DIAGNOSIS — J438 Other emphysema: Secondary | ICD-10-CM | POA: Diagnosis not present

## 2014-05-17 DIAGNOSIS — J61 Pneumoconiosis due to asbestos and other mineral fibers: Secondary | ICD-10-CM | POA: Insufficient documentation

## 2014-05-17 DIAGNOSIS — I5022 Chronic systolic (congestive) heart failure: Secondary | ICD-10-CM | POA: Diagnosis not present

## 2014-05-17 DIAGNOSIS — I4891 Unspecified atrial fibrillation: Secondary | ICD-10-CM | POA: Diagnosis not present

## 2014-05-17 DIAGNOSIS — I10 Essential (primary) hypertension: Secondary | ICD-10-CM | POA: Diagnosis not present

## 2014-05-17 DIAGNOSIS — I2510 Atherosclerotic heart disease of native coronary artery without angina pectoris: Secondary | ICD-10-CM | POA: Insufficient documentation

## 2014-05-17 DIAGNOSIS — E669 Obesity, unspecified: Secondary | ICD-10-CM | POA: Insufficient documentation

## 2014-05-17 DIAGNOSIS — I2589 Other forms of chronic ischemic heart disease: Secondary | ICD-10-CM | POA: Diagnosis not present

## 2014-05-17 DIAGNOSIS — F3289 Other specified depressive episodes: Secondary | ICD-10-CM | POA: Insufficient documentation

## 2014-05-17 DIAGNOSIS — E785 Hyperlipidemia, unspecified: Secondary | ICD-10-CM | POA: Diagnosis not present

## 2014-05-17 DIAGNOSIS — G4733 Obstructive sleep apnea (adult) (pediatric): Secondary | ICD-10-CM | POA: Diagnosis not present

## 2014-05-17 DIAGNOSIS — D509 Iron deficiency anemia, unspecified: Secondary | ICD-10-CM

## 2014-05-17 DIAGNOSIS — Z7901 Long term (current) use of anticoagulants: Secondary | ICD-10-CM | POA: Insufficient documentation

## 2014-05-17 DIAGNOSIS — I4729 Other ventricular tachycardia: Secondary | ICD-10-CM | POA: Diagnosis not present

## 2014-05-17 LAB — CBC
HCT: 31.8 % — ABNORMAL LOW (ref 39.0–52.0)
Hemoglobin: 9.6 g/dL — ABNORMAL LOW (ref 13.0–17.0)
MCH: 32.3 pg (ref 26.0–34.0)
MCHC: 30.2 g/dL (ref 30.0–36.0)
MCV: 107.1 fL — ABNORMAL HIGH (ref 78.0–100.0)
Platelets: 145 10*3/uL — ABNORMAL LOW (ref 150–400)
RBC: 2.97 MIL/uL — ABNORMAL LOW (ref 4.22–5.81)
RDW: 17.9 % — AB (ref 11.5–15.5)
WBC: 7.1 10*3/uL (ref 4.0–10.5)

## 2014-05-17 LAB — PROTIME-INR
INR: 1.92 — ABNORMAL HIGH (ref 0.00–1.49)
PROTHROMBIN TIME: 22 s — AB (ref 11.6–15.2)

## 2014-05-17 LAB — BASIC METABOLIC PANEL
Anion gap: 11 (ref 5–15)
BUN: 13 mg/dL (ref 6–23)
CO2: 22 mEq/L (ref 19–32)
Calcium: 9.9 mg/dL (ref 8.4–10.5)
Chloride: 106 mEq/L (ref 96–112)
Creatinine, Ser: 0.72 mg/dL (ref 0.50–1.35)
GFR calc non Af Amer: >90 mL/min (ref >90)
Glucose, Bld: 138 mg/dL — ABNORMAL HIGH (ref 70–99)
POTASSIUM: 4.2 meq/L (ref 3.7–5.3)
SODIUM: 139 meq/L (ref 137–147)

## 2014-05-17 LAB — PRO B NATRIURETIC PEPTIDE: Pro B Natriuretic peptide (BNP): 504.4 pg/mL — ABNORMAL HIGH (ref 0–125)

## 2014-05-17 LAB — LACTATE DEHYDROGENASE: LDH: 303 U/L — AB (ref 94–250)

## 2014-05-17 NOTE — Progress Notes (Addendum)
Patient ID: [name], male   DOB: 03-12-1941, 73 y.o.   MRN: [id number]  [facility] [place] (Dr. 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 Quit smoking 2003. Uses CPAP and O2 every night. He is s/p LVAD HM II implanted 01/12/13 under DT criteria  Admitted to [facility] 09/13/13 - 09/14/13 for increased fatigue and dyspnea. Found to have mild CHF and anemia. Diuresed with IV lasix and transitioned to PO lasix 40 mg twice a week. Discharge weight 203 lbs on inpatient scale.  Admitting labs revealed Hgb 7.7 for which he received 2 units PCs with appropriate rise in Hgb.   Admit 6/8-6/12 for symptomatic anemia. Had EGD/Colonoscopy which was unrevealing.  6 mm polyp clipped and removed. CT abdomen showed cirrhosis. Placed on heparin bridge. Capsule study pending. Sent home on lovenox. D/C weight 206 lbs.   He returns for follow up. Overall feels good. Denies SOB/PND/Orthopnea. Able to wak around [name]. He has not had lasix in about 1 month. Weight at home 215-220 pounds.lntermittent dizziness standing. Denies BRBPR. Taking all medications. Not using CPAP.  He continues weekly dressing changes.    Denies LVAD alarms. Denies driveline trauma, erythema or drainage.  Denies ICD shocks. Reports taking Coumadin as prescribed and adherence to anticoagulation based dietary restrictions. Denies bright red blood per rectum or melena, no dark urine or hematuria.   Past Medical History  Diagnosis Date  . Ischemic cardiomyopathy      CABG 2003, PCI 2007  EF 27%(myoview 2012  . Chronic systolic heart failure   . Hyperlipidemia   . Hypothyroidism   . Chronic anticoagulation     Afib and LVAD  . Obesity   . COPD (chronic obstructive pulmonary disease)   . Asbestosis(501)     "6 years in the [place]" (05/24/2013)  .  Atrial fibrillation     permanent  . Paroxysmal ventricular tachycardia   . Coronary artery disease   . Implantable cardioverter-defibrillator Medtronic   . Hypertension   . Myocardial infarction 1990's-2000    "2 in  ~ the 1990's; 1 in ~ 2000" (05/24/2013)  . OSA on CPAP   . Depression   . LVAD (left ventricular assist device) present 12/2012    Current Outpatient Prescriptions  Medication Sig Dispense Refill  . albuterol (PROVENTIL) (2.5 MG/3ML) 0.083% nebulizer solution Take 2.5 mg by nebulization every 6 (six) hours as needed for wheezing or shortness of breath.       . budesonide-formoterol (SYMBICORT) 160-4.5 MCG/ACT inhaler Inhale 2 puffs into the lungs 2 (two) times daily.      . cholecalciferol (VITAMIN D) 1000 UNITS tablet Take 2,000 Units by mouth at bedtime. Take 2 capsules hs      . citalopram (CELEXA) 40 MG tablet Take 40 mg by mouth at bedtime.      . cyanocobalamin 1000 MCG tablet Take 1,000 mcg by mouth at bedtime.       . docusate sodium (COLACE) 100 MG capsule Take 200 mg by mouth at bedtime.       . ferrous gluconate (FERGON) 324 MG tablet Take 324 mg by mouth 3 (three) times daily with meals.      . furosemide (LASIX) 40 MG tablet Take 40 mg on days that your weight is greater than 220 lbs.  30 tablet  3  .  levothyroxine (SYNTHROID, LEVOTHROID) 100 MCG tablet Take 50 mcg by mouth at bedtime.       [name] Kitchen losartan (COZAAR) 50 MG tablet Take 1 tablet (50 mg total) by mouth daily.  90 tablet  3  . pantoprazole (PROTONIX) 40 MG tablet Take 1 tablet (40 mg total) by mouth 2 (two) times daily.  60 tablet  3  . simvastatin (ZOCOR) 80 MG tablet Take 40 mg by mouth at bedtime.        [name] Kitchen tiotropium (SPIRIVA) 18 MCG inhalation capsule Place 18 mcg into inhaler and inhale daily.       . vitamin C (ASCORBIC ACID) 500 MG tablet Take 500 mg by mouth 2 (two) times daily.       [name] Kitchen warfarin (COUMADIN) 5 MG tablet Take 5 mg daily except on Tuesdays take 7.5 mg.  30 tablet  3  . nitroGLYCERIN  (NITROSTAT) 0.4 MG SL tablet Place 0.4 mg under the tongue every 5 (five) minutes as needed.         No current facility-administered medications for this encounter.    Review of patient's allergies indicates no known allergies.  REVIEW OF SYSTEMS: All systems negative except as listed in HPI, PMH and Problem list.   LVAD interrogation reveals:  Speed: 9200 Flow:  5.2 Power:  5.5 PI:  5.2 Alarms:  No alarms Events:  Rare PI event Fixed speed:  9200 Low speed limit: 8600  I reviewed the LVAD parameters from today, and compared the results to the patient's prior recorded data.  No programming changes were made.  The LVAD is functioning within specified parameters.  The patient performs LVAD self-test daily.  LVAD interrogation was negative for any significant power changes or alarms. There were a few PI events daily, but nothing concerning and none back to back. LVAD equipment check completed and is in good working order.  Back-up equipment present.   LVAD education done on emergency procedures and precautions and reviewed exit site care.  Filed Vitals:   05/17/14 0846  Pulse: 79  Weight: 225 lb (102.059 kg)  SpO2: 94%    Physical Exam: GENERAL: Well appearing, male; NAD; wife present HEENT: normal  NECK: Supple, JVP flat. 2+ bilaterally, no bruits.  No lymphadenopathy or thyromegaly appreciated.   CARDIAC:  Mechanical heart sounds with LVAD hum present.  LUNGS:  Clear to auscultation bilaterally.  ABDOMEN:  Soft, round, nontender, positive bowel sounds x4.     LVAD exit site: well-healed and incorporated.  Dressing dry and intact.  No erythema, drainage, odor or tenderness.  Stabilization device present and accurately applied.  Driveline dressing is being changed daily per sterile technique. Changed in clinic EXTREMITIES:  Warm and dry, no cyanosis, clubbing, no edema NEUROLOGIC:  Alert and oriented x 4.  Gait steady.  No aphasia.  No dysarthria.  Affect pleasant.       ASSESSMENT AND PLAN:   1) Chronic Systolic HF: s/p LVAD implant 12/2012 - NYHA II symptoms and volume status stable. Will continue lasix 40 mg as needed only if weight greater than 220 lbs on home scale. -  BB due to bradycardia. Continue losartan 50 mg daily which he is tolerating. HR 60s. MAP stable.  - Reinforced the need and importance of daily weights, a low sodium diet, and fluid restriction (less than 2 L a day). Instructed to call the HF clinic if weight increases more than 3 lbs overnight or 5 lbs in a week.  2)  Anticoagulation management  -  No ASA with recent bleeding. INR goal 1.8-2.5 .  INR 1.92 today. Discussed with pharmacy.   Continue current regimen.  3) Anemia - Hgb down al little. Watch closely. Check CBC in 2 weeks. Continue iron supplementation and no longer on ASA. 4 LVAD - No issues. All parameters within normal limits.   - Check CBC, BMET, LDH and INR 5. PAF - on Coumadin. No beta blocker due to bradycardia.   6. Depression - Stable.  Continue Celexa 40 mg daily 7. OSA- not using CPAP. I encouraged him to start wearing again.   Follow up in 2 months  Kam Rahimi NP-C  1:15 PM

## 2014-05-17 NOTE — Patient Instructions (Signed)
Follow up in 2 months

## 2014-05-17 NOTE — Patient Instructions (Signed)
Discussed with patient and pharmacist. Continue 7.5 mg coumadin on Tuesday and 5 mg on all other days Recehck in 2 weeks.

## 2014-05-18 ENCOUNTER — Other Ambulatory Visit (HOSPITAL_COMMUNITY): Payer: Self-pay | Admitting: *Deleted

## 2014-05-18 DIAGNOSIS — Z95811 Presence of heart assist device: Secondary | ICD-10-CM

## 2014-05-18 DIAGNOSIS — Z7901 Long term (current) use of anticoagulants: Secondary | ICD-10-CM

## 2014-05-24 ENCOUNTER — Encounter: Payer: Self-pay | Admitting: Cardiology

## 2014-05-30 ENCOUNTER — Encounter: Payer: Self-pay | Admitting: Internal Medicine

## 2014-06-10 ENCOUNTER — Ambulatory Visit (INDEPENDENT_AMBULATORY_CARE_PROVIDER_SITE_OTHER): Payer: Medicare Other | Admitting: *Deleted

## 2014-06-10 ENCOUNTER — Ambulatory Visit (HOSPITAL_COMMUNITY): Payer: Self-pay | Admitting: *Deleted

## 2014-06-10 DIAGNOSIS — Z7901 Long term (current) use of anticoagulants: Secondary | ICD-10-CM

## 2014-06-10 DIAGNOSIS — Z95811 Presence of heart assist device: Secondary | ICD-10-CM

## 2014-06-10 LAB — CBC WITH DIFFERENTIAL/PLATELET
BASOS ABS: 0 10*3/uL (ref 0.0–0.1)
Basophils Relative: 0.3 % (ref 0.0–3.0)
EOS ABS: 0.3 10*3/uL (ref 0.0–0.7)
Eosinophils Relative: 3.9 % (ref 0.0–5.0)
HCT: 34.7 % — ABNORMAL LOW (ref 39.0–52.0)
Hemoglobin: 11.2 g/dL — ABNORMAL LOW (ref 13.0–17.0)
LYMPHS PCT: 8.5 % — AB (ref 12.0–46.0)
Lymphs Abs: 0.7 10*3/uL (ref 0.7–4.0)
MCHC: 32.3 g/dL (ref 30.0–36.0)
MCV: 104.2 fl — ABNORMAL HIGH (ref 78.0–100.0)
Monocytes Absolute: 0.6 10*3/uL (ref 0.1–1.0)
Monocytes Relative: 7 % (ref 3.0–12.0)
NEUTROS PCT: 80.3 % — AB (ref 43.0–77.0)
Neutro Abs: 7.1 10*3/uL (ref 1.4–7.7)
Platelets: 145 10*3/uL — ABNORMAL LOW (ref 150.0–400.0)
RBC: 3.33 Mil/uL — ABNORMAL LOW (ref 4.22–5.81)
RDW: 17 % — AB (ref 11.5–15.5)
WBC: 8.8 10*3/uL (ref 4.0–10.5)

## 2014-06-10 LAB — PROTIME-INR
INR: 2 ratio — AB (ref 0.8–1.0)
Prothrombin Time: 22 s — ABNORMAL HIGH (ref 9.6–13.1)

## 2014-06-17 ENCOUNTER — Other Ambulatory Visit (HOSPITAL_COMMUNITY): Payer: Self-pay | Admitting: Internal Medicine

## 2014-06-20 ENCOUNTER — Other Ambulatory Visit (HOSPITAL_COMMUNITY): Payer: Self-pay | Admitting: *Deleted

## 2014-06-20 MED ORDER — WARFARIN SODIUM 5 MG PO TABS: ORAL_TABLET | ORAL | Status: DC

## 2014-06-27 ENCOUNTER — Other Ambulatory Visit (HOSPITAL_COMMUNITY): Payer: Self-pay | Admitting: *Deleted

## 2014-06-27 ENCOUNTER — Other Ambulatory Visit: Payer: Medicare Other | Admitting: *Deleted

## 2014-06-27 ENCOUNTER — Ambulatory Visit (HOSPITAL_COMMUNITY): Payer: Self-pay | Admitting: *Deleted

## 2014-06-27 DIAGNOSIS — Z7901 Long term (current) use of anticoagulants: Secondary | ICD-10-CM

## 2014-06-27 DIAGNOSIS — Z95811 Presence of heart assist device: Secondary | ICD-10-CM

## 2014-06-27 LAB — PROTIME-INR
INR: 1.6 ratio — ABNORMAL HIGH (ref 0.8–1.0)
Prothrombin Time: 17.1 s — ABNORMAL HIGH (ref 9.6–13.1)

## 2014-07-01 ENCOUNTER — Other Ambulatory Visit (INDEPENDENT_AMBULATORY_CARE_PROVIDER_SITE_OTHER): Payer: Medicare Other | Admitting: *Deleted

## 2014-07-01 DIAGNOSIS — Z7901 Long term (current) use of anticoagulants: Secondary | ICD-10-CM

## 2014-07-01 DIAGNOSIS — Z95811 Presence of heart assist device: Secondary | ICD-10-CM

## 2014-07-01 LAB — PROTIME-INR
INR: 2 ratio — ABNORMAL HIGH (ref 0.8–1.0)
Prothrombin Time: 21.6 s — ABNORMAL HIGH (ref 9.6–13.1)

## 2014-07-05 ENCOUNTER — Telehealth (HOSPITAL_COMMUNITY): Payer: Self-pay | Admitting: *Deleted

## 2014-07-05 NOTE — Telephone Encounter (Signed)
Called pt - his INR was therapeutic 07/01/14 and lovenox was stopped and no change in coumadin dose per Bonnita Nasuti, PharmD.  Will repeat INR at next clinic visit on 07/19/14; pt verbalized understanding of same.

## 2014-07-15 ENCOUNTER — Other Ambulatory Visit: Payer: Self-pay

## 2014-07-18 ENCOUNTER — Other Ambulatory Visit (HOSPITAL_COMMUNITY): Payer: Self-pay | Admitting: *Deleted

## 2014-07-18 DIAGNOSIS — Z95811 Presence of heart assist device: Secondary | ICD-10-CM

## 2014-07-18 DIAGNOSIS — Z7901 Long term (current) use of anticoagulants: Secondary | ICD-10-CM

## 2014-07-18 NOTE — Telephone Encounter (Signed)
Open in error

## 2014-07-19 ENCOUNTER — Encounter (HOSPITAL_COMMUNITY): Payer: Self-pay | Admitting: *Deleted

## 2014-07-19 ENCOUNTER — Ambulatory Visit (HOSPITAL_COMMUNITY)
Admission: RE | Admit: 2014-07-19 | Discharge: 2014-07-19 | Disposition: A | Discharge status: Home / Self Care | Payer: Medicare Other | Source: Ambulatory Visit | Attending: Cardiology | Admitting: Cardiology

## 2014-07-19 ENCOUNTER — Ambulatory Visit (HOSPITAL_COMMUNITY): Payer: Self-pay | Admitting: *Deleted

## 2014-07-19 VITALS — HR 80 | Wt 225.0 lb

## 2014-07-19 DIAGNOSIS — I2510 Atherosclerotic heart disease of native coronary artery without angina pectoris: Secondary | ICD-10-CM | POA: Diagnosis not present

## 2014-07-19 DIAGNOSIS — Z7901 Long term (current) use of anticoagulants: Secondary | ICD-10-CM | POA: Diagnosis not present

## 2014-07-19 DIAGNOSIS — Z95811 Presence of heart assist device: Secondary | ICD-10-CM | POA: Diagnosis not present

## 2014-07-19 DIAGNOSIS — Z95810 Presence of automatic (implantable) cardiac defibrillator: Secondary | ICD-10-CM | POA: Diagnosis not present

## 2014-07-19 DIAGNOSIS — D509 Iron deficiency anemia, unspecified: Secondary | ICD-10-CM | POA: Diagnosis not present

## 2014-07-19 DIAGNOSIS — F329 Major depressive disorder, single episode, unspecified: Secondary | ICD-10-CM | POA: Insufficient documentation

## 2014-07-19 DIAGNOSIS — G4733 Obstructive sleep apnea (adult) (pediatric): Secondary | ICD-10-CM | POA: Diagnosis not present

## 2014-07-19 DIAGNOSIS — E039 Hypothyroidism, unspecified: Secondary | ICD-10-CM | POA: Diagnosis not present

## 2014-07-19 DIAGNOSIS — J449 Chronic obstructive pulmonary disease, unspecified: Secondary | ICD-10-CM | POA: Diagnosis not present

## 2014-07-19 DIAGNOSIS — D126 Benign neoplasm of colon, unspecified: Secondary | ICD-10-CM | POA: Diagnosis not present

## 2014-07-19 DIAGNOSIS — I480 Paroxysmal atrial fibrillation: Secondary | ICD-10-CM | POA: Insufficient documentation

## 2014-07-19 DIAGNOSIS — E785 Hyperlipidemia, unspecified: Secondary | ICD-10-CM | POA: Insufficient documentation

## 2014-07-19 DIAGNOSIS — Z951 Presence of aortocoronary bypass graft: Secondary | ICD-10-CM | POA: Insufficient documentation

## 2014-07-19 DIAGNOSIS — I10 Essential (primary) hypertension: Secondary | ICD-10-CM | POA: Insufficient documentation

## 2014-07-19 DIAGNOSIS — J439 Emphysema, unspecified: Secondary | ICD-10-CM | POA: Diagnosis not present

## 2014-07-19 DIAGNOSIS — Z87891 Personal history of nicotine dependence: Secondary | ICD-10-CM | POA: Diagnosis not present

## 2014-07-19 DIAGNOSIS — Z9981 Dependence on supplemental oxygen: Secondary | ICD-10-CM | POA: Diagnosis not present

## 2014-07-19 DIAGNOSIS — D649 Anemia, unspecified: Secondary | ICD-10-CM | POA: Insufficient documentation

## 2014-07-19 DIAGNOSIS — I5022 Chronic systolic (congestive) heart failure: Secondary | ICD-10-CM | POA: Diagnosis not present

## 2014-07-19 DIAGNOSIS — J61 Pneumoconiosis due to asbestos and other mineral fibers: Secondary | ICD-10-CM | POA: Insufficient documentation

## 2014-07-19 DIAGNOSIS — I252 Old myocardial infarction: Secondary | ICD-10-CM | POA: Diagnosis not present

## 2014-07-19 LAB — COMPREHENSIVE METABOLIC PANEL
ALBUMIN: 3.5 g/dL (ref 3.5–5.2)
ALK PHOS: 51 U/L (ref 39–117)
ALT: 15 U/L (ref 0–53)
AST: 25 U/L (ref 0–37)
Anion gap: 10 (ref 5–15)
BUN: 14 mg/dL (ref 6–23)
CO2: 24 mEq/L (ref 19–32)
Calcium: 9.6 mg/dL (ref 8.4–10.5)
Chloride: 105 mEq/L (ref 96–112)
Creatinine, Ser: 0.79 mg/dL (ref 0.50–1.35)
GFR calc non Af Amer: 88 mL/min — ABNORMAL LOW (ref >90)
Glucose, Bld: 129 mg/dL — ABNORMAL HIGH (ref 70–99)
POTASSIUM: 4.3 meq/L (ref 3.7–5.3)
SODIUM: 139 meq/L (ref 137–147)
TOTAL PROTEIN: 6.6 g/dL (ref 6.0–8.3)
Total Bilirubin: 0.4 mg/dL (ref 0.3–1.2)

## 2014-07-19 LAB — PRO B NATRIURETIC PEPTIDE: PRO B NATRI PEPTIDE: 296.4 pg/mL — AB (ref 0–125)

## 2014-07-19 LAB — CBC
HCT: 31.2 % — ABNORMAL LOW (ref 39.0–52.0)
Hemoglobin: 9.5 g/dL — ABNORMAL LOW (ref 13.0–17.0)
MCH: 33.6 pg (ref 26.0–34.0)
MCHC: 30.4 g/dL (ref 30.0–36.0)
MCV: 110.2 fL — ABNORMAL HIGH (ref 78.0–100.0)
PLATELETS: 149 10*3/uL — AB (ref 150–400)
RBC: 2.83 MIL/uL — ABNORMAL LOW (ref 4.22–5.81)
RDW: 16.9 % — AB (ref 11.5–15.5)
WBC: 7.2 10*3/uL (ref 4.0–10.5)

## 2014-07-19 LAB — PROTIME-INR
INR: 1.74 — ABNORMAL HIGH (ref 0.00–1.49)
Prothrombin Time: 20.5 seconds — ABNORMAL HIGH (ref 11.6–15.2)

## 2014-07-19 LAB — LACTATE DEHYDROGENASE: LDH: 283 U/L — ABNORMAL HIGH (ref 94–250)

## 2014-07-19 NOTE — Progress Notes (Signed)
Patient ID: [name], male   DOB: 09/20/1941, 73 y.o.   MRN: [id number]   [facility] [place] (Dr. 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 Quit smoking 2003. Uses CPAP and O2 every night. He is s/p LVAD HM II implanted 01/12/13 under DT criteria  Admitted to [facility] 09/13/13 - 09/14/13 for increased fatigue and dyspnea. Found to have mild CHF and anemia. Diuresed with IV lasix and transitioned to PO lasix 40 mg twice a week. Discharge weight 203 lbs on inpatient scale.  Admitting labs revealed Hgb 7.7 for which he received 2 units PCs with appropriate rise in Hgb.   Admit 6/8-6/12 for symptomatic anemia. Had EGD/Colonoscopy which was unrevealing.  6 mm polyp clipped and removed. CT abdomen showed cirrhosis. Placed on heparin bridge. Capsule study pending. Sent home on lovenox. D/C weight 206 lbs.   He returns for follow up. Overall feels good. Denies SOB/PND/Orthopnea. Able to wak around [place]. He has not had lasix in last couple of months. Weight at home 218-220 pounds. Denies BRBPR. Taking all medications. Not using CPAP.  He continues weekly dressing changes.    Denies LVAD alarms. Denies driveline trauma, erythema or drainage.  Denies ICD shocks. Reports taking Coumadin as prescribed and adherence to anticoagulation based dietary restrictions. Denies bright red blood per rectum or melena, no dark urine or hematuria.   Past Medical History  Diagnosis Date  . Ischemic cardiomyopathy      CABG 2003, PCI 2007  EF 27%(myoview 2012  . Chronic systolic heart failure   . Hyperlipidemia   . Hypothyroidism   . Chronic anticoagulation     Afib and LVAD  . Obesity   . COPD (chronic obstructive pulmonary disease)   . Asbestosis(501)     "6 years in the [place]" (05/24/2013)  . Atrial fibrillation      permanent  . Paroxysmal ventricular tachycardia   . Coronary artery disease   . Implantable cardioverter-defibrillator Medtronic   . Hypertension   . Myocardial infarction 1990's-2000    "2 in  ~ the 1990's; 1 in ~ 2000" (05/24/2013)  . OSA on CPAP   . Depression   . LVAD (left ventricular assist device) present 12/2012    Current Outpatient Prescriptions  Medication Sig Dispense Refill  . albuterol (PROVENTIL) (2.5 MG/3ML) 0.083% nebulizer solution Take 2.5 mg by nebulization every 6 (six) hours as needed for wheezing or shortness of breath.       . budesonide-formoterol (SYMBICORT) 160-4.5 MCG/ACT inhaler Inhale 2 puffs into the lungs 2 (two) times daily.      . cholecalciferol (VITAMIN D) 1000 UNITS tablet Take 2,000 Units by mouth at bedtime. Take 2 capsules hs      . citalopram (CELEXA) 40 MG tablet Take 40 mg by mouth at bedtime.      . cyanocobalamin 1000 MCG tablet Take 1,000 mcg by mouth at bedtime.       . docusate sodium (COLACE) 100 MG capsule Take 200 mg by mouth at bedtime.       . ferrous gluconate (FERGON) 324 MG tablet Take 324 mg by mouth 3 (three) times daily with meals.      [name] levothyroxine (SYNTHROID, LEVOTHROID) 100 MCG tablet Take 50 mcg by mouth at bedtime.       [name] losartan (COZAAR) 50 MG  tablet Take 1 tablet (50 mg total) by mouth daily.  90 tablet  3  . pantoprazole (PROTONIX) 40 MG tablet Take 1 tablet (40 mg total) by mouth 2 (two) times daily.  60 tablet  3  . simvastatin (ZOCOR) 80 MG tablet Take 40 mg by mouth at bedtime.        [name] tiotropium (SPIRIVA) 18 MCG inhalation capsule Place 18 mcg into inhaler and inhale daily.       . vitamin C (ASCORBIC ACID) 500 MG tablet Take 500 mg by mouth 2 (two) times daily.       [name] warfarin (COUMADIN) 5 MG tablet Take 5 mg daily except on Tuesdays take 7.5 mg.  45 tablet  3  . furosemide (LASIX) 40 MG tablet Take 40 mg on days that your weight is greater than 220 lbs.  30 tablet  3  . nitroGLYCERIN (NITROSTAT) 0.4 MG SL  tablet Place 0.4 mg under the tongue every 5 (five) minutes as needed.         No current facility-administered medications for this encounter.    Review of patient's allergies indicates no known allergies.  REVIEW OF SYSTEMS: All systems negative except as listed in HPI, PMH and Problem list.   LVAD interrogation reveals:  Speed: 9200 Flow:  5.1 Power:  5.5 PI:  6.5  Alarms:  No alarms Events:  Rare PI event Fixed speed:  9200 Low speed limit: 8600  I reviewed the LVAD parameters from today, and compared the results to the patient's prior recorded data.  No programming changes were made.  The LVAD is functioning within specified parameters.  The patient performs LVAD self-test daily.  LVAD interrogation was negative for any significant power changes or alarms. There were a few PI events daily, but nothing concerning and none back to back. LVAD equipment check completed and is in good working order.  Back-up equipment present.   LVAD education done on emergency procedures and precautions and reviewed exit site care.  Filed Vitals:   07/19/14 1021  Pulse: 80  Weight: 225 lb (102.059 kg)  SpO2: 95%    Physical Exam: MAP 92 GENERAL: Well appearing, male; NAD; wife present HEENT: normal  NECK: Supple, JVP flat. 2+ bilaterally, no bruits.  No lymphadenopathy or thyromegaly appreciated.   CARDIAC:  Mechanical heart sounds with LVAD hum present.  LUNGS:  Clear to auscultation bilaterally.  ABDOMEN:  Soft, round, nontender, positive bowel sounds x4.     LVAD exit site: well-healed and incorporated.  Dressing dry and intact.  No erythema, drainage, odor or tenderness.  Stabilization device present and accurately applied.  Driveline dressing is being changed daily per sterile technique. Changed in clinic EXTREMITIES:  Warm and dry, no cyanosis, clubbing, no edema NEUROLOGIC:  Alert and oriented x 4.  Gait steady.  No aphasia.  No dysarthria.  Affect pleasant.      ASSESSMENT AND  PLAN:   1) Chronic Systolic HF: s/p LVAD implant 12/2012 - NYHA II symptoms and volume status stable. Will continue lasix 40 mg as needed only if weight greater than 220 lbs on home scale. -  Not on BB due to bradycardia. Continue losartan 50 mg daily which he is tolerating. HR 80s. MAP stable.  - Reinforced the need and importance of daily weights, a low sodium diet, and fluid restriction (less than 2 L a day). Instructed to call the HF clinic if weight increases more than 3 lbs overnight or 5 lbs in a week.  2)  Anticoagulation management  -  No ASA with recent bleeding. INR goal 1.8-2.5 .  INR 1.74  Discussed with pharmacy.   Continue current regimen. Continue coumadin 5 mg daily except on Tuesday and Friday he will take 7.5 mg coumadmin.  3) Anemia - Hgb down al little. Watch closely. Continue iron supplementation and no longer on ASA. 4 LVAD - No issues. All parameters within normal limits.   - Check CBC, BMET, LDH and INR- Lab work stable. Coumadin adjusted. Per pharmacy 5. PAF - on Coumadin. No beta blocker due to bradycardia.   6. Depression - Stable.  Continue Celexa 40 mg daily 7. OSA- not using CPAP. I encouraged him to start wearing again.   Follow up in 2 months  Allysa Governale NP-C  10:22 AM

## 2014-07-19 NOTE — Patient Instructions (Signed)
1.  Increase coumadin to 5 mg daily except 7.5 mg on Tues and Friday. Re-check INR next Tuesday. 2.  Return to [place] clinic in two months.

## 2014-07-19 NOTE — Progress Notes (Signed)
Symptom  Yes  No  Details   Angina         x Activity:   Claudication         x        How far: "give out" walking around [employer]  Syncope         x When:   Stroke         x   Orthopnea         x How many pillows:   2 for comfort  PND         x How often:  CPAP         x  How many hrs:  Not wearing cpap or O2 at night  Pedal edema         x   Abd fullness         x   N&V         x   Diaphoresis         x When:  Bleeding        x        Nosebleed yesterday; ear bled after rubbing  Urine color    medium yellow  SOB        x  Activity:  incline  Palpitations         x When:  ICD shock         x   Hospitlizaitons               x When/where/why:    ED visit         x When/where/why:  Other MD       x  When/who/why:  [facility]  Activity    No formal activity; sedentary  Fluid    No limitations  Diet    No limitations   Vital signs: HR:  85 MAP BP:  92;  96/60 (67) O2 Sat: 95 Wt:  231.2 lbs Last wt::  225 lbs Ht: 6'1"  LVAD interrogation reveals:  Speed: 9200 Flow:  5.3 Power:  5.5 PI:  5.6 Alarms: none Events: rare PI           Fixed speed:  9200 Low speed limit: 8600 Primary Controller:  Replace back up battery in 21 months (January 2017) Back up controller:   Replace back up battery in  21 months (January 2017)  LVAD exit site:  Well healed and incorporated. The velour is fully implanted at exit site. Dressing dry and intact. No erythema or drainage. Stabilization device present and accurately applied. Driveline dressing is being changed weekly per sterile technique using Sorbaview dressing with biopatch on exit site. Pt denies fever or chills. Dressing supplies provided to patient/caregiver.  Pt/caregiver deny any alarms or VAD equipment issues. VAD coordinator reviewed daily log from home for daily temperature, weight, and VAD parameters. Pt is performing daily controller and system monitor self tests along with completing weekly and monthly maintenance for LVAD  equipment. LVAD equipment check completed and is in good working order. Back-up equipment present. Back up system controller battery charged during clinic visit.   Pt completed 1.5 year Intermacs including Quality of Life EQ-5D-3L, KCCQ-12, and neurocognitive testing. Pt completed 6 minute walk at 850 feet with minimal SOB and upper leg muscle weakness.  Pt and wife had questions about warfarin and food interactions. [name], PharmD in and spoke with patient and wife re: warfarin and food/drug interactions. All questions answered.  Rx sent to [facility] PCP: Dr. Kavin Leech at fax: [phone] for VAD weekly dressing kit and Lovenox 100 mg.

## 2014-07-23 ENCOUNTER — Emergency Department (HOSPITAL_COMMUNITY): Payer: Non-veteran care

## 2014-07-23 ENCOUNTER — Encounter (HOSPITAL_COMMUNITY): Payer: Self-pay | Admitting: Emergency Medicine

## 2014-07-23 ENCOUNTER — Inpatient Hospital Stay (HOSPITAL_COMMUNITY)
Admission: EM | Admit: 2014-07-23 | Discharge: 2014-08-04 | DRG: 327 | Disposition: A | Discharge status: Home / Self Care | Payer: Non-veteran care | Attending: Cardiology | Admitting: Cardiology

## 2014-07-23 ENCOUNTER — Encounter (HOSPITAL_COMMUNITY): Payer: Self-pay | Admitting: *Deleted

## 2014-07-23 DIAGNOSIS — Z95810 Presence of automatic (implantable) cardiac defibrillator: Secondary | ICD-10-CM

## 2014-07-23 DIAGNOSIS — F329 Major depressive disorder, single episode, unspecified: Secondary | ICD-10-CM | POA: Diagnosis present

## 2014-07-23 DIAGNOSIS — I480 Paroxysmal atrial fibrillation: Secondary | ICD-10-CM | POA: Diagnosis not present

## 2014-07-23 DIAGNOSIS — E039 Hypothyroidism, unspecified: Secondary | ICD-10-CM | POA: Diagnosis present

## 2014-07-23 DIAGNOSIS — D123 Benign neoplasm of transverse colon: Secondary | ICD-10-CM | POA: Diagnosis present

## 2014-07-23 DIAGNOSIS — K449 Diaphragmatic hernia without obstruction or gangrene: Secondary | ICD-10-CM | POA: Diagnosis present

## 2014-07-23 DIAGNOSIS — K31811 Angiodysplasia of stomach and duodenum with bleeding: Principal | ICD-10-CM

## 2014-07-23 DIAGNOSIS — D124 Benign neoplasm of descending colon: Secondary | ICD-10-CM | POA: Diagnosis present

## 2014-07-23 DIAGNOSIS — Z8249 Family history of ischemic heart disease and other diseases of the circulatory system: Secondary | ICD-10-CM | POA: Diagnosis not present

## 2014-07-23 DIAGNOSIS — K2961 Other gastritis with bleeding: Secondary | ICD-10-CM

## 2014-07-23 DIAGNOSIS — Z79899 Other long term (current) drug therapy: Secondary | ICD-10-CM | POA: Diagnosis not present

## 2014-07-23 DIAGNOSIS — E871 Hypo-osmolality and hyponatremia: Secondary | ICD-10-CM | POA: Diagnosis present

## 2014-07-23 DIAGNOSIS — I252 Old myocardial infarction: Secondary | ICD-10-CM | POA: Diagnosis not present

## 2014-07-23 DIAGNOSIS — K922 Gastrointestinal hemorrhage, unspecified: Secondary | ICD-10-CM | POA: Insufficient documentation

## 2014-07-23 DIAGNOSIS — G4733 Obstructive sleep apnea (adult) (pediatric): Secondary | ICD-10-CM | POA: Diagnosis present

## 2014-07-23 DIAGNOSIS — D509 Iron deficiency anemia, unspecified: Secondary | ICD-10-CM

## 2014-07-23 DIAGNOSIS — Z95811 Presence of heart assist device: Secondary | ICD-10-CM

## 2014-07-23 DIAGNOSIS — J439 Emphysema, unspecified: Secondary | ICD-10-CM | POA: Diagnosis present

## 2014-07-23 DIAGNOSIS — I5022 Chronic systolic (congestive) heart failure: Secondary | ICD-10-CM | POA: Diagnosis present

## 2014-07-23 DIAGNOSIS — D62 Acute posthemorrhagic anemia: Secondary | ICD-10-CM | POA: Diagnosis present

## 2014-07-23 DIAGNOSIS — I255 Ischemic cardiomyopathy: Secondary | ICD-10-CM | POA: Diagnosis present

## 2014-07-23 DIAGNOSIS — I10 Essential (primary) hypertension: Secondary | ICD-10-CM | POA: Diagnosis present

## 2014-07-23 DIAGNOSIS — Z951 Presence of aortocoronary bypass graft: Secondary | ICD-10-CM

## 2014-07-23 DIAGNOSIS — D649 Anemia, unspecified: Secondary | ICD-10-CM | POA: Diagnosis present

## 2014-07-23 DIAGNOSIS — I2510 Atherosclerotic heart disease of native coronary artery without angina pectoris: Secondary | ICD-10-CM | POA: Diagnosis present

## 2014-07-23 DIAGNOSIS — K648 Other hemorrhoids: Secondary | ICD-10-CM | POA: Diagnosis present

## 2014-07-23 DIAGNOSIS — Z87891 Personal history of nicotine dependence: Secondary | ICD-10-CM

## 2014-07-23 DIAGNOSIS — E785 Hyperlipidemia, unspecified: Secondary | ICD-10-CM | POA: Diagnosis present

## 2014-07-23 DIAGNOSIS — I482 Chronic atrial fibrillation, unspecified: Secondary | ICD-10-CM

## 2014-07-23 DIAGNOSIS — R0602 Shortness of breath: Secondary | ICD-10-CM

## 2014-07-23 LAB — CBC WITH DIFFERENTIAL/PLATELET
BASOS PCT: 0 % (ref 0–1)
Basophils Absolute: 0 10*3/uL (ref 0.0–0.1)
EOS PCT: 1 % (ref 0–5)
Eosinophils Absolute: 0.1 10*3/uL (ref 0.0–0.7)
HEMATOCRIT: 26.9 % — AB (ref 39.0–52.0)
Hemoglobin: 8.2 g/dL — ABNORMAL LOW (ref 13.0–17.0)
Lymphocytes Relative: 11 % — ABNORMAL LOW (ref 12–46)
Lymphs Abs: 1 10*3/uL (ref 0.7–4.0)
MCH: 33.1 pg (ref 26.0–34.0)
MCHC: 30.5 g/dL (ref 30.0–36.0)
MCV: 108.5 fL — AB (ref 78.0–100.0)
MONO ABS: 0.6 10*3/uL (ref 0.1–1.0)
Monocytes Relative: 6 % (ref 3–12)
Neutro Abs: 7.5 10*3/uL (ref 1.7–7.7)
Neutrophils Relative %: 82 % — ABNORMAL HIGH (ref 43–77)
Platelets: 178 10*3/uL (ref 150–400)
RBC: 2.48 MIL/uL — ABNORMAL LOW (ref 4.22–5.81)
RDW: 18.7 % — AB (ref 11.5–15.5)
WBC: 9.3 10*3/uL (ref 4.0–10.5)

## 2014-07-23 LAB — COMPREHENSIVE METABOLIC PANEL
ALK PHOS: 49 U/L (ref 39–117)
ALT: 12 U/L (ref 0–53)
AST: 23 U/L (ref 0–37)
Albumin: 3.3 g/dL — ABNORMAL LOW (ref 3.5–5.2)
Anion gap: 10 (ref 5–15)
BILIRUBIN TOTAL: 0.5 mg/dL (ref 0.3–1.2)
BUN: 29 mg/dL — AB (ref 6–23)
CHLORIDE: 99 meq/L (ref 96–112)
CO2: 22 mEq/L (ref 19–32)
Calcium: 9.4 mg/dL (ref 8.4–10.5)
Creatinine, Ser: 0.81 mg/dL (ref 0.50–1.35)
GFR calc Af Amer: >90 mL/min (ref >90)
GFR calc non Af Amer: 87 mL/min — ABNORMAL LOW (ref >90)
Glucose, Bld: 141 mg/dL — ABNORMAL HIGH (ref 70–99)
POTASSIUM: 4.5 meq/L (ref 3.7–5.3)
Sodium: 131 mEq/L — ABNORMAL LOW (ref 137–147)
Total Protein: 6.3 g/dL (ref 6.0–8.3)

## 2014-07-23 LAB — PREPARE RBC (CROSSMATCH)

## 2014-07-23 LAB — LACTATE DEHYDROGENASE: LDH: 272 U/L — ABNORMAL HIGH (ref 94–250)

## 2014-07-23 LAB — PROTIME-INR
INR: 2 — AB (ref 0.00–1.49)
PROTHROMBIN TIME: 22.9 s — AB (ref 11.6–15.2)

## 2014-07-23 LAB — I-STAT TROPONIN, ED: Troponin i, poc: 0.01 ng/mL (ref 0.00–0.08)

## 2014-07-23 LAB — PRO B NATRIURETIC PEPTIDE: Pro B Natriuretic peptide (BNP): 241.4 pg/mL — ABNORMAL HIGH (ref 0–125)

## 2014-07-23 MED ORDER — ALBUTEROL SULFATE (2.5 MG/3ML) 0.083% IN NEBU
2.5000 mg | INHALATION_SOLUTION | Freq: Four times a day (QID) | RESPIRATORY_TRACT | Status: DC | PRN
Start: 2014-07-23 — End: 2014-08-04

## 2014-07-23 MED ORDER — CITALOPRAM HYDROBROMIDE 40 MG PO TABS
40.0000 mg | ORAL_TABLET | Freq: Every day | ORAL | Status: DC
  Administered 2014-07-23 – 2014-08-03 (×12): 40 mg via ORAL
  Filled 2014-07-23 (×13): qty 1

## 2014-07-23 MED ORDER — SODIUM CHLORIDE 0.9 % IV SOLN
Freq: Once | INTRAVENOUS | Status: AC
  Administered 2014-07-23: 16:00:00 via INTRAVENOUS

## 2014-07-23 MED ORDER — BUDESONIDE-FORMOTEROL FUMARATE 160-4.5 MCG/ACT IN AERO
2.0000 | INHALATION_SPRAY | Freq: Two times a day (BID) | RESPIRATORY_TRACT | Status: DC
  Administered 2014-07-23 – 2014-08-04 (×22): 2 via RESPIRATORY_TRACT
  Filled 2014-07-23: qty 6

## 2014-07-23 MED ORDER — SODIUM CHLORIDE 0.9 % IJ SOLN
3.0000 mL | Freq: Two times a day (BID) | INTRAMUSCULAR | Status: DC
  Administered 2014-07-23 – 2014-08-03 (×20): 3 mL via INTRAVENOUS

## 2014-07-23 MED ORDER — SODIUM CHLORIDE 0.9 % IV SOLN
250.0000 mL | INTRAVENOUS | Status: DC | PRN
  Administered 2014-08-01: 12:00:00 via INTRAVENOUS

## 2014-07-23 MED ORDER — VITAMIN C 500 MG PO TABS
500.0000 mg | ORAL_TABLET | Freq: Two times a day (BID) | ORAL | Status: DC
  Administered 2014-07-23 – 2014-08-04 (×23): 500 mg via ORAL
  Filled 2014-07-23 (×26): qty 1

## 2014-07-23 MED ORDER — NITROGLYCERIN 0.4 MG SL SUBL: 0.4000 mg | SUBLINGUAL_TABLET | SUBLINGUAL | Status: DC | PRN

## 2014-07-23 MED ORDER — WARFARIN - PHARMACIST DOSING INPATIENT
Freq: Every day | Status: DC
Start: 2014-07-23 — End: 2014-08-04
  Administered 2014-07-23 – 2014-07-24 (×2)

## 2014-07-23 MED ORDER — DOCUSATE SODIUM 100 MG PO CAPS
200.0000 mg | ORAL_CAPSULE | Freq: Every day | ORAL | Status: DC
  Administered 2014-07-23 – 2014-08-03 (×8): 200 mg via ORAL
  Filled 2014-07-23 (×12): qty 2

## 2014-07-23 MED ORDER — TIOTROPIUM BROMIDE MONOHYDRATE 18 MCG IN CAPS
18.0000 ug | ORAL_CAPSULE | Freq: Every day | RESPIRATORY_TRACT | Status: DC
  Administered 2014-07-24 – 2014-08-02 (×10): 18 ug via RESPIRATORY_TRACT
  Filled 2014-07-23 (×3): qty 5

## 2014-07-23 MED ORDER — LEVOTHYROXINE SODIUM 50 MCG PO TABS
50.0000 ug | ORAL_TABLET | Freq: Every day | ORAL | Status: DC
  Administered 2014-07-23 – 2014-08-03 (×12): 50 ug via ORAL
  Filled 2014-07-23 (×13): qty 1

## 2014-07-23 MED ORDER — VITAMIN D3 25 MCG (1000 UNIT) PO TABS
2000.0000 [IU] | ORAL_TABLET | Freq: Every day | ORAL | Status: DC
  Administered 2014-07-23 – 2014-08-03 (×12): 2000 [IU] via ORAL
  Filled 2014-07-23 (×13): qty 2

## 2014-07-23 MED ORDER — VITAMIN B-12 1000 MCG PO TABS
1000.0000 ug | ORAL_TABLET | Freq: Every day | ORAL | Status: DC
  Administered 2014-07-23 – 2014-08-03 (×12): 1000 ug via ORAL
  Filled 2014-07-23 (×13): qty 1

## 2014-07-23 MED ORDER — WARFARIN SODIUM 7.5 MG PO TABS
7.5000 mg | ORAL_TABLET | ORAL | Status: DC
  Filled 2014-07-23: qty 1

## 2014-07-23 MED ORDER — PANTOPRAZOLE SODIUM 40 MG PO TBEC
40.0000 mg | DELAYED_RELEASE_TABLET | Freq: Two times a day (BID) | ORAL | Status: DC
  Administered 2014-07-23 – 2014-08-01 (×19): 40 mg via ORAL
  Filled 2014-07-23 (×18): qty 1

## 2014-07-23 MED ORDER — WARFARIN SODIUM 5 MG PO TABS
5.0000 mg | ORAL_TABLET | ORAL | Status: DC
  Administered 2014-07-23 – 2014-07-25 (×3): 5 mg via ORAL
  Filled 2014-07-23 (×3): qty 1

## 2014-07-23 MED ORDER — SODIUM CHLORIDE 0.9 % IJ SOLN: 3.0000 mL | INTRAMUSCULAR | Status: DC | PRN

## 2014-07-23 MED ORDER — FERROUS GLUCONATE 324 (38 FE) MG PO TABS
324.0000 mg | ORAL_TABLET | Freq: Three times a day (TID) | ORAL | Status: DC
  Administered 2014-07-24 – 2014-07-28 (×14): 324 mg via ORAL
  Filled 2014-07-23 (×20): qty 1

## 2014-07-23 MED ORDER — ATORVASTATIN CALCIUM 40 MG PO TABS
40.0000 mg | ORAL_TABLET | Freq: Every day | ORAL | Status: DC
  Administered 2014-07-23 – 2014-08-03 (×12): 40 mg via ORAL
  Filled 2014-07-23 (×13): qty 1

## 2014-07-23 NOTE — ED Provider Notes (Signed)
CSN: [id number]     Arrival date & time 07/23/14  1343 History   First MD Initiated Contact with Patient 07/23/14 1353     No chief complaint on file.    (Consider location/radiation/quality/duration/timing/severity/associated sxs/prior Treatment) Patient is a 73 y.o. male presenting with shortness of breath. The history is provided by the patient. No language interpreter was used.  Shortness of Breath Severity:  Moderate Onset quality:  Gradual Duration:  3 days Timing:  Constant Progression:  Worsening Chronicity:  Recurrent Context: activity   Relieved by:  Rest Worsened by:  Activity and exertion Ineffective treatments:  None tried Associated symptoms: no abdominal pain, no chest pain, no cough, no fever, no headaches, no hemoptysis, no rash, no sore throat, no sputum production, no syncope and no vomiting   Risk factors comment:  LVAD patient   Past Medical History  Diagnosis Date  . Ischemic cardiomyopathy      CABG 2003, PCI 2007  EF 27%(myoview 2012  . Chronic systolic heart failure   . Hyperlipidemia   . Hypothyroidism   . Chronic anticoagulation     Afib and LVAD  . Obesity   . COPD (chronic obstructive pulmonary disease)   . Asbestosis(501)     "6 years in the [place]" (05/24/2013)  . Atrial fibrillation     permanent  . Paroxysmal ventricular tachycardia   . Coronary artery disease   . Implantable cardioverter-defibrillator Medtronic   . Hypertension   . Myocardial infarction 1990's-2000    "2 in  ~ the 1990's; 1 in ~ 2000" (05/24/2013)  . OSA on CPAP   . Depression   . LVAD (left ventricular assist device) present 12/2012   Past Surgical History  Procedure Laterality Date  . Coronary artery bypass graft  2003    "?X4" (05/24/2013)  . Cardiac defibrillator placement  2004; ~ 2010  . Insertion of implantable left ventricular assist device N/A 01/12/2013    Procedure: INSERTION OF IMPLANTABLE LEFT VENTRICULAR ASSIST DEVICE;  Surgeon: Ivin Poot, MD;   Location: [place];  Service: Open Heart Surgery;  Laterality: N/A;   nitric oxide; Redo sternotomy  . Intraoperative transesophageal echocardiogram N/A 01/12/2013    Procedure: INTRAOPERATIVE TRANSESOPHAGEAL ECHOCARDIOGRAM;  Surgeon: Ivin Poot, MD;  Location: [place];  Service: Open Heart Surgery;  Laterality: N/A;  . Colonoscopy N/A 03/09/2014    Procedure: COLONOSCOPY;  Surgeon: Inda Castle, MD;  Location: [place];  Service: Endoscopy;  Laterality: N/A;  LVAD  patient  . Esophagogastroduodenoscopy N/A 03/09/2014    Procedure: ESOPHAGOGASTRODUODENOSCOPY (EGD);  Surgeon: Inda Castle, MD;  Location: [place];  Service: Endoscopy;  Laterality: N/A;  . Givens capsule study N/A 03/10/2014    Procedure: GIVENS CAPSULE STUDY;  Surgeon: Inda Castle, MD;  Location: [place];  Service: Endoscopy;  Laterality: N/A;   Family History  Problem Relation Age of Onset  . Heart attack Mother   . Heart attack Father    History  Substance Use Topics  . Smoking status: Former Smoker -- 2.00 packs/day for 45 years    Types: Cigarettes    Quit date: 11/11/2001  . Smokeless tobacco: Never Used  . Alcohol Use: No     Comment: 05/24/2013 "use to drink beer; hardly nothing since 2003; once in awhile a beer "    Review of Systems  Constitutional: Negative for fever.  HENT: Negative for congestion, rhinorrhea and sore throat.   Respiratory: Positive for shortness of breath. Negative for cough, hemoptysis  and sputum production.   Cardiovascular: Negative for chest pain and syncope.  Gastrointestinal: Negative for nausea, vomiting, abdominal pain and diarrhea.  Genitourinary: Negative for dysuria and hematuria.  Skin: Negative for rash.  Neurological: Negative for syncope, light-headedness and headaches.  All other systems reviewed and are negative.     Allergies  Review of patient's allergies indicates no known allergies.  Home Medications   Prior to Admission medications    Medication Sig Start Date End Date Taking? Authorizing Provider  albuterol (PROVENTIL) (2.5 MG/3ML) 0.083% nebulizer solution Take 2.5 mg by nebulization every 6 (six) hours as needed for wheezing or shortness of breath.     Historical Provider, MD  budesonide-formoterol (SYMBICORT) 160-4.5 MCG/ACT inhaler Inhale 2 puffs into the lungs 2 (two) times daily.    Historical Provider, MD  cholecalciferol (VITAMIN D) 1000 UNITS tablet Take 2,000 Units by mouth at bedtime. Take 2 capsules hs    Historical Provider, MD  citalopram (CELEXA) 40 MG tablet Take 40 mg by mouth at bedtime.    Historical Provider, MD  cyanocobalamin 1000 MCG tablet Take 1,000 mcg by mouth at bedtime.     Historical Provider, MD  docusate sodium (COLACE) 100 MG capsule Take 200 mg by mouth at bedtime.     Historical Provider, MD  ferrous gluconate (FERGON) 324 MG tablet Take 324 mg by mouth 3 (three) times daily with meals.    Historical Provider, MD  furosemide (LASIX) 40 MG tablet Take 40 mg on days that your weight is greater than 220 lbs. 03/11/14   Rande Brunt, NP  levothyroxine (SYNTHROID, LEVOTHROID) 100 MCG tablet Take 50 mcg by mouth at bedtime.     Historical Provider, MD  losartan (COZAAR) 50 MG tablet Take 1 tablet (50 mg total) by mouth daily. 04/06/14   Rande Brunt, NP  nitroGLYCERIN (NITROSTAT) 0.4 MG SL tablet Place 0.4 mg under the tongue every 5 (five) minutes as needed.      Historical Provider, MD  pantoprazole (PROTONIX) 40 MG tablet Take 1 tablet (40 mg total) by mouth 2 (two) times daily. 03/11/14   Rande Brunt, NP  simvastatin (ZOCOR) 80 MG tablet Take 40 mg by mouth at bedtime.      Historical Provider, MD  tiotropium (SPIRIVA) 18 MCG inhalation capsule Place 18 mcg into inhaler and inhale daily.     Historical Provider, MD  vitamin C (ASCORBIC ACID) 500 MG tablet Take 500 mg by mouth 2 (two) times daily.     Historical Provider, MD  warfarin (COUMADIN) 5 MG tablet Take 5 mg daily except on Tuesdays  take 7.5 mg. 06/20/14   Rande Brunt, NP   BP   Pulse 80  Temp(Src) 97.4 F (36.3 C) (Oral)  SpO2 100% Physical Exam  Nursing note and vitals reviewed. Constitutional: He is oriented to person, place, and time. He appears well-developed and well-nourished.  HENT:  Head: Normocephalic and atraumatic.  Right Ear: External ear normal.  Left Ear: External ear normal.  Eyes: EOM are normal.  Neck: Normal range of motion. Neck supple.  Cardiovascular: Normal rate, regular rhythm and intact distal pulses.  Exam reveals no gallop and no friction rub.   No murmur heard. Pulmonary/Chest: Effort normal and breath sounds normal. No respiratory distress. He has no wheezes. He has no rales. He exhibits no tenderness.  LVAD insertion site clean dry and in place. No evidence of purulent drainage or cellulitis  Abdominal: Soft. Bowel sounds are normal. He exhibits  no distension. There is no tenderness. There is no rebound and no guarding.  Musculoskeletal: Normal range of motion. He exhibits no edema and no tenderness.  Lymphadenopathy:    He has no cervical adenopathy.  Neurological: He is alert and oriented to person, place, and time.  Skin: Skin is warm. No rash noted.  Psychiatric: He has a normal mood and affect. His behavior is normal.    ED Course  Procedures (including critical care time) Labs Review Labs Reviewed  PRO B NATRIURETIC PEPTIDE - Abnormal; Notable for the following:    Pro B Natriuretic peptide (BNP) 241.4 (*)    All other components within normal limits  PROTIME-INR - Abnormal; Notable for the following:    Prothrombin Time 22.9 (*)    INR 2.00 (*)    All other components within normal limits  COMPREHENSIVE METABOLIC PANEL - Abnormal; Notable for the following:    Sodium 131 (*)    Glucose, Bld 141 (*)    BUN 29 (*)    Albumin 3.3 (*)    GFR calc non Af Amer 87 (*)    All other components within normal limits  CBC WITH DIFFERENTIAL - Abnormal; Notable for the  following:    RBC 2.48 (*)    Hemoglobin 8.2 (*)    HCT 26.9 (*)    MCV 108.5 (*)    RDW 18.7 (*)    Neutrophils Relative % 82 (*)    Lymphocytes Relative 11 (*)    All other components within normal limits  LACTATE DEHYDROGENASE - Abnormal; Notable for the following:    LDH 272 (*)    All other components within normal limits  I-STAT TROPOININ, ED  TYPE AND SCREEN  PREPARE RBC (CROSSMATCH)    Imaging Review Dg Chest Port 1 View  07/23/2014   CLINICAL DATA:  Shortness of breath, LVAD, open-heart surgery  EXAM: PORTABLE CHEST - 1 VIEW  COMPARISON:  09/13/2014  FINDINGS: Lungs are clear.  No pleural effusion or pneumothorax.  Cardiomegaly. Left subclavian ICD. LVAD. Postsurgical changes related to prior CABG.  IMPRESSION: No evidence of acute cardiopulmonary disease.  Postsurgical changes as above.   Electronically Signed   By: Julian Hy M.D.   On: 07/23/2014 15:02     EKG Interpretation   Date/Time:  Saturday July 23 2014 13:50:58 EDT Ventricular Rate:  89 PR Interval:  190 QRS Duration: 92 QT Interval:  369 QTC Calculation: 449 R Axis:   -18 Text Interpretation:  Sinus rhythm Borderline left axis deviation Repol  abnrm suggests ischemia, anterolateral Artifact in lead(s) II III aVR aVL  aVF V1 V2 V3 V4 V5 No significant change since last tracing Confirmed by  Zenia Resides  MD, ANTHONY ([id number]) on 07/23/2014 2:08:40 PM      MDM   Final diagnoses:  None    2:09 PM Pt is a 73 y.o. male with pertinent PMHX of ischemic cardiomyopathy s/p LVAD, HLD, hypothyroidism, COPD, on warfarin, HTN, afib who presents to the ED with shortness of breath. worseningg shortness of breath on exertion since Wednesday. No fevers or infectious symptoms. No alarms from LVAD. History of similar secondary to anemia. Recent colonoscopy/EGD and pill camera that was negative work up for blood loss. No chest pains. No syncope. No missed doses of meds. No sick contacts  On exam: well appearing.  dopplerable BP 100 per nursing. Plan for CBC, CMP, INR, type and screen, LDH, BNP and troponin. Concern for possible anemia as cause of shortness of breath.  No alarms from LVAD to suggest LVAD malfunction.  Review of labs: ISTAT troponin: 0.01 BNP: 241.4 INR 2.00 CMP: hyponatremia, no elevated LFTs CBC: no leukocytosis, H&H 8.2/26.9 LDH: 272  CXR AP portable per my read for shortness of breath showed no pleural effusion  EKG personally reviewed by myself showed NSR, left axis deviation Rate of 89, PR 133ms, QRS 81ms QT/QTC 369/460ms, left axis, without evidence of new ischemia. Comparison showed similar, indication: shortness of breath  Discussed with cardiology with plan to transfuse 2 units and admit for observation.  Labs, EKG and imaging reviewed by myself and considered in medical decision making if ordered.  Imaging interpreted by radiology. Pt was discussed with my attending, Dr. Dawna Part, MD 07/23/14 1515

## 2014-07-23 NOTE — ED Notes (Signed)
Rapid called to help transport patient.

## 2014-07-23 NOTE — ED Notes (Signed)
Blood bank called, blood is ready. RN in report right now.

## 2014-07-23 NOTE — ED Provider Notes (Signed)
I saw and evaluated the patient, reviewed the resident's note and I agree with the findings and plan.   EKG Interpretation   Date/Time:  Saturday July 23 2014 13:50:58 EDT Ventricular Rate:  89 PR Interval:  190 QRS Duration: 92 QT Interval:  369 QTC Calculation: 449 R Axis:   -18 Text Interpretation:  Sinus rhythm Borderline left axis deviation Repol  abnrm suggests ischemia, anterolateral Artifact in lead(s) II III aVR aVL  aVF V1 V2 V3 V4 V5 No significant change since last tracing Confirmed by  Jackie Russman  MD, Davie Claud ([id number]) on 07/23/2014 2:08:40 PM     Patient here with weakness consistent with his anemia that he has had before in the past. Denies any anginal type chest pain. No black or bloody stools. Has seen his physician for similar symptoms and they have been unable to find a cause. We'll check patient's CBC and consult with cardiologist   Leota Jacobsen, MD 07/23/14 [phone]

## 2014-07-23 NOTE — ED Provider Notes (Signed)
I saw and evaluated the patient, reviewed the resident's note and I agree with the findings and plan.   EKG Interpretation   Date/Time:  Saturday July 23 2014 13:50:58 EDT Ventricular Rate:  89 PR Interval:  190 QRS Duration: 92 QT Interval:  369 QTC Calculation: 449 R Axis:   -18 Text Interpretation:  Sinus rhythm Borderline left axis deviation Repol  abnrm suggests ischemia, anterolateral Artifact in lead(s) II III aVR aVL  aVF V1 V2 V3 V4 V5 No significant change since last tracing Confirmed by  Zenia Resides  MD, Zoelle Markus ([id number]) on 07/23/2014 2:08:40 PM        Leota Jacobsen, MD 07/23/14 1536

## 2014-07-23 NOTE — Progress Notes (Signed)
Anticoagulant  CONSULT NOTE - INITIAL  Pharmacy Consult for coumadin Indication: LVAD  No Known Allergies   Vital Signs: Temp: 98 F (36.7 C) (10/24 1658) Temp Source: Oral (10/24 1530) Pulse Rate: 80 (10/24 1658) Intake/Output from previous day:   Intake/Output from this shift: Total I/O In: 335 [Blood:335] Out: -   Labs:  Recent Labs  07/23/14 1410  WBC 9.3  HGB 8.2*  PLT 178  CREATININE 0.81   The CrCl is unknown because both a height and weight (above a minimum accepted value) are required for this calculation. No results found for this basename: VANCOTROUGH, VANCOPEAK, VANCORANDOM, GENTTROUGH, GENTPEAK, GENTRANDOM, TOBRATROUGH, TOBRAPEAK, TOBRARND, AMIKACINPEAK, AMIKACINTROU, AMIKACIN,  in the last 72 hours   Microbiology: No results found for this or any previous visit (from the past 720 hour(s)).  Medical History: Past Medical History  Diagnosis Date  . Ischemic cardiomyopathy      CABG 2003, PCI 2007  EF 27%(myoview 2012  . Chronic systolic heart failure   . Hyperlipidemia   . Hypothyroidism   . Chronic anticoagulation     Afib and LVAD  . Obesity   . COPD (chronic obstructive pulmonary disease)   . Asbestosis(501)     "6 years in the [place]" (05/24/2013)  . Atrial fibrillation     permanent  . Paroxysmal ventricular tachycardia   . Coronary artery disease   . Implantable cardioverter-defibrillator Medtronic   . Hypertension   . Myocardial infarction 1990's-2000    "2 in  ~ the 1990's; 1 in ~ 2000" (05/24/2013)  . OSA on CPAP   . Depression   . LVAD (left ventricular assist device) present 12/2012    Medications:  Prescriptions prior to admission  Medication Sig Dispense Refill  . budesonide-formoterol (SYMBICORT) 160-4.5 MCG/ACT inhaler Inhale 2 puffs into the lungs 2 (two) times daily.      . cholecalciferol (VITAMIN D) 1000 UNITS tablet Take 2,000 Units by mouth at bedtime.       . citalopram (CELEXA) 40 MG tablet Take 40 mg by mouth at  bedtime.      . cyanocobalamin 1000 MCG tablet Take 1,000 mcg by mouth at bedtime.       . docusate sodium (COLACE) 100 MG capsule Take 200 mg by mouth at bedtime.       . ferrous gluconate (FERGON) 324 MG tablet Take 324 mg by mouth 3 (three) times daily with meals.      . furosemide (LASIX) 40 MG tablet Take 40 mg by mouth daily as needed (fluid retention).      [name] levothyroxine (SYNTHROID, LEVOTHROID) 100 MCG tablet Take 50 mcg by mouth at bedtime.       [name] losartan (COZAAR) 50 MG tablet Take 1 tablet (50 mg total) by mouth daily.  90 tablet  3  . Multiple Vitamins-Minerals (MULTIVITAMIN PO) Take 1 tablet by mouth daily.      . pantoprazole (PROTONIX) 40 MG tablet Take 1 tablet (40 mg total) by mouth 2 (two) times daily.  60 tablet  3  . potassium chloride (K-DUR) 10 MEQ tablet Take 10 mEq by mouth daily as needed (when taking Furosemide).      . simvastatin (ZOCOR) 80 MG tablet Take 40 mg by mouth at bedtime.        [name] tiotropium (SPIRIVA) 18 MCG inhalation capsule Place 18 mcg into inhaler and inhale daily.       . vitamin C (ASCORBIC ACID) 500 MG tablet Take 500 mg by mouth  2 (two) times daily.       [name] warfarin (COUMADIN) 5 MG tablet Take 5-7.5 mg by mouth daily. 7.5mg  on Tuesday and Friday, 5mg  all other days      . albuterol (PROVENTIL) (2.5 MG/3ML) 0.083% nebulizer solution Take 2.5 mg by nebulization every 6 (six) hours as needed for wheezing or shortness of breath.       . nitroGLYCERIN (NITROSTAT) 0.4 MG SL tablet Place 0.4 mg under the tongue every 5 (five) minutes as needed for chest pain.        Assessment: 73 yo man to continue coumadin for LVAD.  His home dose listed above.  Last dose 10/23.  INR today 2.0  Goal of Therapy:  INR 1.8-2.5  Plan:  Continue home coumadin dose Daily PT/INR  Thanks for allowing pharmacy to be a part of this patient's care.  Excell Seltzer, PharmD Clinical Pharmacist, [phone] 07/23/2014,5:14 PM

## 2014-07-23 NOTE — H&P (Signed)
Physician History and Physical    BRENTT Wood MRN: [id number] DOB/AGE: 1941/03/14 73 y.o. Admit date: 07/23/2014  Primary Cardiologist: Dr. 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 Quit smoking 2003. Uses CPAP and O2 every night.   He is s/p LVAD HM II implanted 01/12/13 under DT criteria.   Admitted to [facility] 09/13/13 - 09/14/13 for increased fatigue and dyspnea. Found to have mild CHF and anemia. Diuresed with IV lasix and transitioned to PO lasix 40 mg twice a week. Discharge weight 203 lbs on inpatient scale. Admitting labs revealed Hgb 7.7 for which he received 2 units PCs with appropriate rise in Hgb.   Admit 6/8-6/12/15r symptomatic anemia. Had EGD/Colonoscopy which was unrevealing. 6 mm polyp clipped and removed. CT abdomen showed cirrhosis. Capsule study showed small bowel angioectasia that may have been source of slow GI blood loss.   Currently, he is getting Lasix prn.  He has had increased dyspnea recently, more so over the last 3-4 days.  His weight has been stable.  He tried taking a Lasix yesterday with no effect.  He is now short of breath walking around the house.  He came to ER for evaluation of this today.  Hemoglobin has trended down from 11.2 1 month ago to 9.5 4 days ago to 8.2 today.  He is not on ASA given prior history of GI bleeding and INR goal has been in the 2-2.5 range.  CXR clear.   VAD interrogation revealed:  Speed: 9200  Flow: 5.3  Power: 5.7  PI: 4.9  Alarms: none  Events: Rare PI Fixed speed: 9200  Low speed limit: 8600  Primary Controller: Replace back up battery in 21 months  Back up controller: Replace back up battery in 21 months   I reviewed the LVAD parameters from today, and compared the results to the patient's prior recorded data. No programming changes were made. The LVAD is functioning within  specified parameters. LVAD interrogation was negative for any significant power changes, alarms or PI events/speed drops. LVAD equipment check completed and is in good working order. Backup equipment present.   Review of systems complete and found to be negative unless listed above   No current facility-administered medications for this encounter.   Current Outpatient Prescriptions  Medication Sig Dispense Refill  . budesonide-formoterol (SYMBICORT) 160-4.5 MCG/ACT inhaler Inhale 2 puffs into the lungs 2 (two) times daily.      . cholecalciferol (VITAMIN D) 1000 UNITS tablet Take 2,000 Units by mouth at bedtime.       . citalopram (CELEXA) 40 MG tablet Take 40 mg by mouth at bedtime.      . cyanocobalamin 1000 MCG tablet Take 1,000 mcg by mouth at bedtime.       . docusate sodium (COLACE) 100 MG capsule Take 200 mg by mouth at bedtime.       . ferrous gluconate (FERGON) 324 MG tablet Take 324 mg by mouth 3 (three) times daily with meals.      . furosemide (LASIX) 40 MG tablet Take 40 mg by mouth daily as needed (fluid retention).      [name] levothyroxine (SYNTHROID, LEVOTHROID) 100 MCG tablet Take 50 mcg by mouth at bedtime.       [name] losartan (COZAAR) 50 MG tablet Take 1 tablet (50 mg total) by mouth daily.  90 tablet  3  .  Multiple Vitamins-Minerals (MULTIVITAMIN PO) Take 1 tablet by mouth daily.      . pantoprazole (PROTONIX) 40 MG tablet Take 1 tablet (40 mg total) by mouth 2 (two) times daily.  60 tablet  3  . potassium chloride (K-DUR) 10 MEQ tablet Take 10 mEq by mouth daily as needed (when taking Furosemide).      . simvastatin (ZOCOR) 80 MG tablet Take 40 mg by mouth at bedtime.        [name] tiotropium (SPIRIVA) 18 MCG inhalation capsule Place 18 mcg into inhaler and inhale daily.       . vitamin C (ASCORBIC ACID) 500 MG tablet Take 500 mg by mouth 2 (two) times daily.       [name] warfarin (COUMADIN) 5 MG tablet Take 5-7.5 mg by mouth daily. 7.5mg  on Tuesday and Friday, 5mg  all other days      .  albuterol (PROVENTIL) (2.5 MG/3ML) 0.083% nebulizer solution Take 2.5 mg by nebulization every 6 (six) hours as needed for wheezing or shortness of breath.       . nitroGLYCERIN (NITROSTAT) 0.4 MG SL tablet Place 0.4 mg under the tongue every 5 (five) minutes as needed for chest pain.          Past Medical History  Diagnosis Date  . Ischemic cardiomyopathy      CABG 2003, PCI 2007  EF 27%(myoview 2012  . Chronic systolic heart failure   . Hyperlipidemia   . Hypothyroidism   . Chronic anticoagulation     Afib and LVAD  . Obesity   . COPD (chronic obstructive pulmonary disease)   . Asbestosis(501)     "6 years in the [place]" (05/24/2013)  . Atrial fibrillation     permanent  . Paroxysmal ventricular tachycardia   . Coronary artery disease   . Implantable cardioverter-defibrillator [employer]   . Hypertension   . Myocardial infarction 1990's-2000    "2 in  ~ the 1990's; 1 in ~ 2000" (05/24/2013)  . OSA on CPAP   . Depression   . LVAD (left ventricular assist device) present 12/2012    Family History  Problem Relation Age of Onset  . Heart attack Mother   . Heart attack Father     History   Social History  . Marital Status: Married    Spouse Name: N/A    Number of Children: N/A  . Years of Education: N/A   Occupational History  . Not on file.   Social History Main Topics  . Smoking status: Former Smoker -- 2.00 packs/day for 45 years    Types: Cigarettes    Quit date: 11/11/2001  . Smokeless tobacco: Never Used  . Alcohol Use: No     Comment: 05/24/2013 "use to drink beer; hardly nothing since 2003; once in awhile a beer "  . Drug Use: No  . Sexual Activity: No   Other Topics Concern  . Not on file   Social History Narrative  . No narrative on file    Physical Exam: Blood pressure , pulse 81, temperature 98.1 F (36.7 C), temperature source Oral, resp. rate 25, SpO2 96.00%.  General: NAD Neck: No JVD, no thyromegaly or thyroid nodule.  Lungs: Clear to  auscultation bilaterally with normal respiratory effort. CV: Nondisplaced PMI.  Heart regular S1/S2, no S3/S4, no murmur.  No peripheral edema.  No carotid bruit.  Normal pedal pulses.  Abdomen: Soft, nontender, no hepatosplenomegaly, no distention.  Skin: Intact without lesions or rashes.  Neurologic: Alert and  oriented x 3.  Psych: Normal affect. Extremities: No clubbing or cyanosis.  HEENT: Normal.   Labs:   Lab Results  Component Value Date   WBC 9.3 07/23/2014   HGB 8.2* 07/23/2014   HCT 26.9* 07/23/2014   MCV 108.5* 07/23/2014   PLT 178 07/23/2014    Recent Labs Lab 07/23/14 1410  NA 131*  K 4.5  CL 99  CO2 22  BUN 29*  CREATININE 0.81  CALCIUM 9.4  PROT 6.3  BILITOT 0.5  ALKPHOS 49  ALT 12  AST 23  GLUCOSE 141*  LDH 272 INR 2 BNP 241   Radiology:  - CXR: No acute findings  EKG: NSR, nonspecific T wave changes  ASSESSMENT AND PLAN:  73 yo with CAD s/p CABG and ischemic cardiomyopathy s/p Heartmate II LVAD was admitted with increased exertional dyspnea.  He was found to be anemic.  1. Exertional dyspnea: Weight is stable, BNP is not particularly high.  LDH is stable and LVAD indices are stable.  He is not volume overloaded on exam.  I suspect that the cause of his dyspnea is anemia.  2. Anemia: Acute on chronic GI blood loss.  Small bowel angioectasias found on capsule study in 6/15 likely explain.  No overt bleeding: denies BRBPR.  Dark stool but takes iron.  - Will check Fe indices and guaiac stool. - Transfuse 2 units PRBCs.   - Had extensive GI workup in 6/15.   - Will likely need hematology followup as outpatient to arrange transfusions as needed.  - He will remain off ASA but will have to stay on coumadin, goal INR 1.8-2.5.   3. Chronic systolic CHF: Ischemic CMP, has Heartmate II LVAD.  Indices stable.  Will hold losartan with soft MAP for now.    Signed: Loralie Champagne 07/23/2014, 4:33 PM

## 2014-07-23 NOTE — ED Notes (Signed)
To Ed via private vehicle from home with c/o increasing SOB with exertion-- pt has an LVAD--

## 2014-07-23 NOTE — ED Notes (Signed)
[name], LVAD coordinator in to see pt--

## 2014-07-23 NOTE — ED Notes (Signed)
Consent at bedside with patient

## 2014-07-23 NOTE — Progress Notes (Signed)
Wife states driveline dressing is done weekly, and pt and wife complete dressing changes on Sunday; wife states she will be here in AM and will change dressing then; pt anticipating d/c home tomorrow; will cont. To monitor.

## 2014-07-23 NOTE — Progress Notes (Signed)
Pt's wife called VAD pager to report pt having increased SOB and fatigue over last 3 days.  Instructed her to bring patient to ED per Dr. Aundra Dubin.  VAD coordinator met patient in ED and attached patient to power module and history reviewed. Pt awake, alert, pale, skin warm and dry. Denies any VAD alarms, syncope, or active bleeding.  VAD interrogation revealed: Speed:  9200 Flow:  5.3 Power:  5.7 PI: 4.9 Alarms:  none Events:   Rare PI Fixed speed:  9200 Low speed limit:  8600 Primary Controller:  Replace back up battery in 21 months Back up controller:   Replace back up battery in 21 months  I reviewed the LVAD parameters from today, and compared the results to the patient's prior recorded data.  No programming changes were made.  The LVAD is functioning within specified parameters.  LVAD interrogation was negative for any significant power changes, alarms or PI events/speed drops.  LVAD equipment check completed and is in good working order.  Backup equipment present.   Dr. Aundra Dubin updated - ordered 2 units PC's, admit overnight for observation to 2W.

## 2014-07-24 DIAGNOSIS — K2961 Other gastritis with bleeding: Secondary | ICD-10-CM

## 2014-07-24 LAB — IRON AND TIBC
Iron: 64 ug/dL (ref 42–135)
Saturation Ratios: 20 % (ref 20–55)
TIBC: 324 ug/dL (ref 215–435)
UIBC: 260 ug/dL (ref 125–400)

## 2014-07-24 LAB — BASIC METABOLIC PANEL
ANION GAP: 10 (ref 5–15)
BUN: 30 mg/dL — ABNORMAL HIGH (ref 6–23)
CO2: 24 meq/L (ref 19–32)
CREATININE: 0.77 mg/dL (ref 0.50–1.35)
Calcium: 9.1 mg/dL (ref 8.4–10.5)
Chloride: 102 mEq/L (ref 96–112)
GFR calc non Af Amer: 89 mL/min — ABNORMAL LOW (ref >90)
Glucose, Bld: 100 mg/dL — ABNORMAL HIGH (ref 70–99)
Potassium: 3.9 mEq/L (ref 3.7–5.3)
Sodium: 136 mEq/L — ABNORMAL LOW (ref 137–147)

## 2014-07-24 LAB — FERRITIN: Ferritin: 22 ng/mL (ref 22–322)

## 2014-07-24 LAB — CBC
HCT: 26.8 % — ABNORMAL LOW (ref 39.0–52.0)
HEMOGLOBIN: 8.4 g/dL — AB (ref 13.0–17.0)
MCH: 32.4 pg (ref 26.0–34.0)
MCHC: 31.3 g/dL (ref 30.0–36.0)
MCV: 103.5 fL — ABNORMAL HIGH (ref 78.0–100.0)
Platelets: 153 10*3/uL (ref 150–400)
RBC: 2.59 MIL/uL — ABNORMAL LOW (ref 4.22–5.81)
RDW: 21 % — AB (ref 11.5–15.5)
WBC: 9.7 10*3/uL (ref 4.0–10.5)

## 2014-07-24 LAB — PROTIME-INR
INR: 2.15 — ABNORMAL HIGH (ref 0.00–1.49)
PROTHROMBIN TIME: 24.2 s — AB (ref 11.6–15.2)

## 2014-07-24 LAB — PREPARE RBC (CROSSMATCH)

## 2014-07-24 LAB — LACTATE DEHYDROGENASE: LDH: 232 U/L (ref 94–250)

## 2014-07-24 MED ORDER — SODIUM CHLORIDE 0.9 % IV SOLN
Freq: Once | INTRAVENOUS | Status: AC
  Administered 2014-07-24: 17:00:00 via INTRAVENOUS

## 2014-07-24 MED ORDER — SODIUM CHLORIDE 0.9 % IV SOLN
1020.0000 mg | Freq: Once | INTRAVENOUS | Status: AC
  Administered 2014-07-24: 1020 mg via INTRAVENOUS
  Filled 2014-07-24 (×2): qty 34

## 2014-07-24 MED ORDER — FUROSEMIDE 10 MG/ML IJ SOLN
20.0000 mg | Freq: Once | INTRAMUSCULAR | Status: AC
  Administered 2014-07-24: 20 mg via INTRAVENOUS
  Filled 2014-07-24: qty 2

## 2014-07-24 NOTE — Progress Notes (Signed)
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 Quit smoking 2003. Uses CPAP and O2 every night.   He is s/p LVAD HM II implanted 01/12/13 under DT criteria.   Admitted to [facility] 09/13/13 - 09/14/13 for increased fatigue and dyspnea. Found to have mild CHF and anemia. Diuresed with IV lasix and transitioned to PO lasix 40 mg twice a week. Discharge weight 203 lbs on inpatient scale. Admitting labs revealed Hgb 7.7 for which he received 2 units PCs with appropriate rise in Hgb.   Admit 6/8-6/12/15r symptomatic anemia. Had EGD/Colonoscopy which was unrevealing. 6 mm polyp clipped and removed. CT abdomen showed cirrhosis. Capsule study showed small bowel angioectasia that may have been source of slow GI blood loss.   Got 2u RBCs hgb 8.2-> 8.4. Still very dyspneic walking to bathroom. No obvious bleeding source. Stools dark on iron but firm. INR 2.1. MAPs reported in 64s but I checked and got 78. Losartan on hold.   VAD interrogation revealed:  Speed: 9200  Flow:  4.9  Power: 5.5 PI: 6.2 Alarms: none  Events: No PI Fixed speed: 9200  Low speed limit: 8600  Primary Controller: Replace back up battery in 21 months  Back up controller: Replace back up battery in 21 months   I reviewed the LVAD parameters from today, and compared the results to the patient's prior recorded data. No programming changes were made. The LVAD is functioning within specified parameters. LVAD interrogation was negative for any significant power changes, alarms or PI events/speed drops. LVAD equipment check completed and is in good working order. Backup equipment present.    Objective:    Vital Signs:   Temp:  [97.4 F (36.3 C)-98.7 F (37.1 C)] 97.4 F (36.3 C) (10/25 1542) Pulse Rate:  [64-86] 86 (10/25 1542) Resp:  [19-25] 19 (10/25 0534) SpO2:  [96 %-100  %] 100 % (10/25 0534) Weight:  [97.614 kg (215 lb 3.2 oz)-97.705 kg (215 lb 6.4 oz)] 97.614 kg (215 lb 3.2 oz) (10/25 0534) Last BM Date: 07/22/14 Mean arterial Pressure 78  Intake/Output:   Intake/Output Summary (Last 24 hours) at 07/24/14 1552 Last data filed at 07/24/14 0830  Gross per 24 hour  Intake    974 ml  Output    950 ml  Net     24 ml     Physical Exam:  Blood pressure , pulse 81, temperature 98.1 F (36.7 C), temperature source Oral, resp. rate 25, SpO2 96.00%.  General: NAD  Neck: No JVD, no thyromegaly or thyroid nodule.  Lungs: Clear to auscultation bilaterally with normal respiratory effort.  CV: Nondisplaced PMI. Heart regular S1/S2, no S3/S4, no murmur. No peripheral edema. No carotid bruit. Normal pedal pulses.  Abdomen: Soft, nontender, no hepatosplenomegaly, no distention.  Skin: Intact without lesions or rashes.  Neurologic: Alert and oriented x 3.  Psych: Normal affect.  Extremities: No clubbing or cyanosis.  HEENT: Normal.    Telemetry: NSR  Labs: Basic Metabolic Panel:  Recent Labs Lab 07/19/14 0830 07/23/14 1410 07/24/14 0420  NA 139 131* 136*  K 4.3 4.5 3.9  CL 105 99 102  CO2 24 22 24   GLUCOSE 129* 141* 100*  BUN 14 29* 30*  CREATININE 0.79 0.81 0.77  CALCIUM 9.6 9.4 9.1    Liver Function Tests:  Recent Labs Lab 07/19/14 0830 07/23/14 1410  AST 25 23  ALT 15 12  ALKPHOS 51 49  BILITOT 0.4 0.5  PROT 6.6 6.3  ALBUMIN 3.5 3.3*   No results found for this basename: LIPASE, AMYLASE,  in the last 168 hours No results found for this basename: AMMONIA,  in the last 168 hours  CBC:  Recent Labs Lab 07/19/14 0830 07/23/14 1410 07/24/14 0420  WBC 7.2 9.3 9.7  NEUTROABS  --  7.5  --   HGB 9.5* 8.2* 8.4*  HCT 31.2* 26.9* 26.8*  MCV 110.2* 108.5* 103.5*  PLT 149* 178 153    INR:  Recent Labs Lab 07/19/14 0830 07/23/14 1410 07/24/14 0420  INR 1.74* 2.00* 2.15*    Other results:    Imaging: Dg Chest Port 1  View  07/23/2014   CLINICAL DATA:  Shortness of breath, LVAD, open-heart surgery  EXAM: PORTABLE CHEST - 1 VIEW  COMPARISON:  09/13/2014  FINDINGS: Lungs are clear.  No pleural effusion or pneumothorax.  Cardiomegaly. Left subclavian ICD. LVAD. Postsurgical changes related to prior CABG.  IMPRESSION: No evidence of acute cardiopulmonary disease.  Postsurgical changes as above.   Electronically Signed   By: Julian Hy M.D.   On: 07/23/2014 15:02      Medications:     Scheduled Medications: . atorvastatin  40 mg Oral q1800  . budesonide-formoterol  2 puff Inhalation BID  . cholecalciferol  2,000 Units Oral QHS  . citalopram  40 mg Oral QHS  . docusate sodium  200 mg Oral QHS  . ferrous gluconate  324 mg Oral TID WC  . levothyroxine  50 mcg Oral QHS  . pantoprazole  40 mg Oral BID  . sodium chloride  3 mL Intravenous Q12H  . tiotropium  18 mcg Inhalation Daily  . cyanocobalamin  1,000 mcg Oral QHS  . vitamin C  500 mg Oral BID  . warfarin  5 mg Oral Once per day on Sun Mon Wed Thu Sat   And  . [START ON 07/26/2014] warfarin  7.5 mg Oral Once per day on Tue Fri  . Warfarin - Pharmacist Dosing Inpatient   Does not apply q1800     Infusions:     PRN Medications:  sodium chloride, albuterol, nitroGLYCERIN, sodium chloride   Assessment/plan:   73 yo with CAD s/p CABG and ischemic cardiomyopathy s/p Heartmate II LVAD was admitted with increased exertional dyspnea. He was found to be anemic.  1. Exertional dyspnea: Weight is stable, BNP is not particularly high. LDH is stable and LVAD indices are stable. He is not volume overloaded on exam. I suspect that the cause of his dyspnea is anemia.  2. Anemia: Acute on chronic GI blood loss. Small bowel angioectasias found on capsule study in 6/15 likely explain. No overt bleeding but hgb did not respond to 2u.  - Iron low. Will give Feraheme - Transfuse 2 more units PRBCs.  - Had extensive GI workup in 6/15. May need to repeat EGD  with push enteroscopy if HGb not responding - He will remain off ASA but will have to stay on coumadin, goal INR 1.8-2.5.  3. Chronic systolic CHF: Ischemic CMP, has Heartmate II LVAD. Indices stable. Will hold losartan with soft MAP for now. Volume status ok.    Length of Stay: 1  Glori Bickers MD 07/24/2014, 3:52 PM  VAD Team --- VAD ISSUES ONLY--- Pager [phone] (7am - 7am)  Advanced Heart Failure Team  Pager [phone] (M-F; 7a - 4p)  Please contact [facility]  for night-coverage after hours (4p -7a ) and weekends on [email or web address]

## 2014-07-24 NOTE — Progress Notes (Signed)
Molly paged back and made RN aware that Dr. Mahalia Longest was rounding on pts and aware that family was awaiting his visit, Molly reassured RN that he would be here when he could, family made aware Rickard Rhymes, RN

## 2014-07-24 NOTE — Progress Notes (Signed)
ANTICOAGULATION CONSULT NOTE - Follow Up Consult  Pharmacy Consult for coumadin Indication: LVAD  No Known Allergies  Patient Measurements: Height: 6' (182.9 cm) Weight: 215 lb 3.2 oz (97.614 kg) IBW/kg (Calculated) : 77.6   Vital Signs: Temp: 98.4 F (36.9 C) (10/25 0534) Temp Source: Oral (10/25 0534) Pulse Rate: 80 (10/25 0534)  Labs:  Recent Labs  07/23/14 1410 07/24/14 0420  HGB 8.2* 8.4*  HCT 26.9* 26.8*  PLT 178 153  LABPROT 22.9* 24.2*  INR 2.00* 2.15*  CREATININE 0.81 0.77    Estimated Creatinine Clearance: 101.1 ml/min (by C-G formula based on Cr of 0.77).   Medications:  Prescriptions prior to admission  Medication Sig Dispense Refill  . budesonide-formoterol (SYMBICORT) 160-4.5 MCG/ACT inhaler Inhale 2 puffs into the lungs 2 (two) times daily.      . cholecalciferol (VITAMIN D) 1000 UNITS tablet Take 2,000 Units by mouth at bedtime.       . citalopram (CELEXA) 40 MG tablet Take 40 mg by mouth at bedtime.      . cyanocobalamin 1000 MCG tablet Take 1,000 mcg by mouth at bedtime.       . docusate sodium (COLACE) 100 MG capsule Take 200 mg by mouth at bedtime.       . ferrous gluconate (FERGON) 324 MG tablet Take 324 mg by mouth 3 (three) times daily with meals.      . furosemide (LASIX) 40 MG tablet Take 40 mg by mouth daily as needed (fluid retention).      [name] levothyroxine (SYNTHROID, LEVOTHROID) 100 MCG tablet Take 50 mcg by mouth at bedtime.       [name] losartan (COZAAR) 50 MG tablet Take 1 tablet (50 mg total) by mouth daily.  90 tablet  3  . Multiple Vitamins-Minerals (MULTIVITAMIN PO) Take 1 tablet by mouth daily.      . pantoprazole (PROTONIX) 40 MG tablet Take 1 tablet (40 mg total) by mouth 2 (two) times daily.  60 tablet  3  . potassium chloride (K-DUR) 10 MEQ tablet Take 10 mEq by mouth daily as needed (when taking Furosemide).      . simvastatin (ZOCOR) 80 MG tablet Take 40 mg by mouth at bedtime.        [name] tiotropium (SPIRIVA) 18 MCG inhalation  capsule Place 18 mcg into inhaler and inhale daily.       . vitamin C (ASCORBIC ACID) 500 MG tablet Take 500 mg by mouth 2 (two) times daily.       [name] warfarin (COUMADIN) 5 MG tablet Take 5-7.5 mg by mouth daily. 7.5mg  on Tuesday and Friday, 5mg  all other days      . albuterol (PROVENTIL) (2.5 MG/3ML) 0.083% nebulizer solution Take 2.5 mg by nebulization every 6 (six) hours as needed for wheezing or shortness of breath.       . nitroGLYCERIN (NITROSTAT) 0.4 MG SL tablet Place 0.4 mg under the tongue every 5 (five) minutes as needed for chest pain.         Assessment: 73 yo man continuing coumadin for LVAD.  His INR remains in goal range on home dose.  His Hg had trended down and received 2 units pRBCs.  Goal of Therapy:  INR 1.8-2.5 Monitor platelets by anticoagulation protocol: Yes   Plan:  Continue home regimen Check daily PT/INR and f/u CBC  Thanks for allowing pharmacy to be a part of this patient's care.  Excell Seltzer, PharmD Clinical Pharmacist, [phone] 07/24/2014,12:33 PM

## 2014-07-24 NOTE — Progress Notes (Signed)
Pt's wife at bedside, pts wife expresses concern that MD or VAD team has not been by, also concern for pts hgb level and shortness of breath upon exertion, VAD pager paged, awaiting call back Rickard Rhymes, RN

## 2014-07-25 LAB — BASIC METABOLIC PANEL
ANION GAP: 12 (ref 5–15)
BUN: 33 mg/dL — AB (ref 6–23)
CO2: 21 mEq/L (ref 19–32)
Calcium: 9.3 mg/dL (ref 8.4–10.5)
Chloride: 102 mEq/L (ref 96–112)
Creatinine, Ser: 0.79 mg/dL (ref 0.50–1.35)
GFR calc Af Amer: >90 mL/min (ref >90)
GFR, EST NON AFRICAN AMERICAN: 88 mL/min — AB (ref >90)
Glucose, Bld: 117 mg/dL — ABNORMAL HIGH (ref 70–99)
POTASSIUM: 4.1 meq/L (ref 3.7–5.3)
Sodium: 135 mEq/L — ABNORMAL LOW (ref 137–147)

## 2014-07-25 LAB — CBC
HCT: 28.4 % — ABNORMAL LOW (ref 39.0–52.0)
Hemoglobin: 9.3 g/dL — ABNORMAL LOW (ref 13.0–17.0)
MCH: 32.1 pg (ref 26.0–34.0)
MCHC: 32.7 g/dL (ref 30.0–36.0)
MCV: 97.9 fL (ref 78.0–100.0)
Platelets: 143 10*3/uL — ABNORMAL LOW (ref 150–400)
RBC: 2.9 MIL/uL — ABNORMAL LOW (ref 4.22–5.81)
RDW: 22.7 % — AB (ref 11.5–15.5)
WBC: 10.4 10*3/uL (ref 4.0–10.5)

## 2014-07-25 LAB — PROTIME-INR
INR: 2.23 — ABNORMAL HIGH (ref 0.00–1.49)
PROTHROMBIN TIME: 24.9 s — AB (ref 11.6–15.2)

## 2014-07-25 LAB — HAPTOGLOBIN: Haptoglobin: <25 mg/dL — ABNORMAL LOW (ref 45–215)

## 2014-07-25 LAB — TSH: TSH: 5.36 u[IU]/mL — ABNORMAL HIGH (ref 0.350–4.500)

## 2014-07-25 LAB — T4, FREE: FREE T4: 1.15 ng/dL (ref 0.80–1.80)

## 2014-07-25 NOTE — Progress Notes (Signed)
Utilization review completed.  

## 2014-07-25 NOTE — Progress Notes (Signed)
CARDIAC REHAB PHASE I   PRE:  Rate/Rhythm: 93 afib  BP:  Supine:   Sitting: 82 map  Standing:    SaO2: 98%RA  MODE:  Ambulation: 150 ft   POST:  Rate/Rhythm: 100-121 afib    3 to 4 wide complex beats  BP:  Supine:   Sitting: 90 map  Standing:    SaO2: 96%RA 1350-1418 Pt stated he has not felt too good since IV lasix given. Skin a little moist and he stated a little nauseated. Walked 150 ft on RA with hand held asst x2 with steady gait. Stopped once to lean on desk and rest. Denied dizziness. Back up bag taken with [place]. Few wide complex beats seen on return to room. Pt tired with short walk.   Graylon Good, RN BSN  07/25/2014 2:13 PM

## 2014-07-25 NOTE — Progress Notes (Signed)
ANTICOAGULATION CONSULT NOTE - Follow Up Consult  Pharmacy Consult for coumadin Indication: LVAD  No Known Allergies  Patient Measurements: Height: 6' (182.9 cm) Weight: 215 lb 4.8 oz (97.659 kg) IBW/kg (Calculated) : 77.6   Vital Signs: Temp: 97.9 F (36.6 C) (10/26 0355) Temp Source: Oral (10/26 0355) Pulse Rate: 79 (10/26 0355)  Labs:  Recent Labs  07/23/14 1410 07/24/14 0420 07/25/14 0322  HGB 8.2* 8.4* 9.3*  HCT 26.9* 26.8* 28.4*  PLT 178 153 143*  LABPROT 22.9* 24.2* 24.9*  INR 2.00* 2.15* 2.23*  CREATININE 0.81 0.77 0.79    Estimated Creatinine Clearance: 101.1 ml/min (by C-G formula based on Cr of 0.79).   Medications:  Prescriptions prior to admission  Medication Sig Dispense Refill  . budesonide-formoterol (SYMBICORT) 160-4.5 MCG/ACT inhaler Inhale 2 puffs into the lungs 2 (two) times daily.      . cholecalciferol (VITAMIN D) 1000 UNITS tablet Take 2,000 Units by mouth at bedtime.       . citalopram (CELEXA) 40 MG tablet Take 40 mg by mouth at bedtime.      . cyanocobalamin 1000 MCG tablet Take 1,000 mcg by mouth at bedtime.       . docusate sodium (COLACE) 100 MG capsule Take 200 mg by mouth at bedtime.       . ferrous gluconate (FERGON) 324 MG tablet Take 324 mg by mouth 3 (three) times daily with meals.      . furosemide (LASIX) 40 MG tablet Take 40 mg by mouth daily as needed (fluid retention).      [name] levothyroxine (SYNTHROID, LEVOTHROID) 100 MCG tablet Take 50 mcg by mouth at bedtime.       [name] losartan (COZAAR) 50 MG tablet Take 1 tablet (50 mg total) by mouth daily.  90 tablet  3  . Multiple Vitamins-Minerals (MULTIVITAMIN PO) Take 1 tablet by mouth daily.      . pantoprazole (PROTONIX) 40 MG tablet Take 1 tablet (40 mg total) by mouth 2 (two) times daily.  60 tablet  3  . potassium chloride (K-DUR) 10 MEQ tablet Take 10 mEq by mouth daily as needed (when taking Furosemide).      . simvastatin (ZOCOR) 80 MG tablet Take 40 mg by mouth at bedtime.         [name] tiotropium (SPIRIVA) 18 MCG inhalation capsule Place 18 mcg into inhaler and inhale daily.       . vitamin C (ASCORBIC ACID) 500 MG tablet Take 500 mg by mouth 2 (two) times daily.       [name] warfarin (COUMADIN) 5 MG tablet Take 5-7.5 mg by mouth daily. 7.5mg  on Tuesday and Friday, 5mg  all other days      . albuterol (PROVENTIL) (2.5 MG/3ML) 0.083% nebulizer solution Take 2.5 mg by nebulization every 6 (six) hours as needed for wheezing or shortness of breath.       . nitroGLYCERIN (NITROSTAT) 0.4 MG SL tablet Place 0.4 mg under the tongue every 5 (five) minutes as needed for chest pain.         Assessment: 73 yo man continuing coumadin for LVAD.  His INR remains in goal range on home dose.  His Hg had trended down and received 2 units pRBCs, up today. No complications noted, patient's INR stable will change checks to mwf.  Goal of Therapy:  INR 1.8-2.5 Monitor platelets by anticoagulation protocol: Yes   Plan:  Continue home regimen INR check MWF  Thanks for allowing pharmacy to be a part  of this patient's care.  Erin Hearing PharmD., BCPS Clinical Pharmacist Pager [phone] 07/25/2014 10:49 AM

## 2014-07-25 NOTE — Progress Notes (Signed)
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 Quit smoking 2003. Uses CPAP and O2 every night.   He is s/p LVAD HM II implanted 01/12/13 under DT criteria.   Admitted to [facility] 09/13/13 - 09/14/13 for increased fatigue and dyspnea. Found to have mild CHF and anemia. Diuresed with IV lasix and transitioned to PO lasix 40 mg twice a week. Discharge weight 203 lbs on inpatient scale. Admitting labs revealed Hgb 7.7 for which he received 2 units PCs with appropriate rise in Hgb.   Admit 6/8-6/12/15r symptomatic anemia. Had EGD/Colonoscopy which was unrevealing. 6 mm polyp clipped and removed. CT abdomen showed cirrhosis. Capsule study showed small bowel angioectasia that may have been source of slow GI blood loss.   Received 2 PRBCs overnight (total 4) and Hgb trending up, 9.3. Received Faraheme yesterday. No obvious bleeding source. Stools dark on iron but firm. INR 2.2. Losartan on hold. MAPs 80s. Feels much better and wants to go home.   HeartMate II LVAD: Flow: 5.5    Speed: 9200      Power: 5.6    PI:   6.5 No PI events        Objective:    Vital Signs:   Temp:  [97.4 F (36.3 C)-98.6 F (37 C)] 97.9 F (36.6 C) (10/26 0355) Pulse Rate:  [79-90] 79 (10/26 0355) Resp:  [18-20] 18 (10/26 0355) SpO2:  [98 %-100 %] 100 % (10/26 0355) Weight:  [215 lb 4.8 oz (97.659 kg)] 215 lb 4.8 oz (97.659 kg) (10/26 0405) Last BM Date: 07/25/14 Mean arterial Pressure 78  Intake/Output:   Intake/Output Summary (Last 24 hours) at 07/25/14 1126 Last data filed at 07/25/14 6160  Gross per 24 hour  Intake    902 ml  Output    950 ml  Net    -48 ml     Physical Exam:   General: Elderly,  NAD  Neck: No JVD, no thyromegaly or thyroid nodule.  Lungs: Clear to auscultation bilaterally with normal respiratory effort.  CV: LVAD hum present;   Abdomen: Soft, nontender, no hepatosplenomegaly, no distention. Driveline dressing C/D/I Skin: Intact without lesions or rashes.  Neurologic: Alert and oriented x 3.  Psych: Normal affect.  Extremities: No clubbing or cyanosis. No edema. HEENT: Normal.   Telemetry: NSR  Labs: Basic Metabolic Panel:  Recent Labs Lab 07/19/14 0830 07/23/14 1410 07/24/14 0420 07/25/14 0322  NA 139 131* 136* 135*  K 4.3 4.5 3.9 4.1  CL 105 99 102 102  CO2 24 22 24 21   GLUCOSE 129* 141* 100* 117*  BUN 14 29* 30* 33*  CREATININE 0.79 0.81 0.77 0.79  CALCIUM 9.6 9.4 9.1 9.3    Liver Function Tests:  Recent Labs Lab 07/19/14 0830 07/23/14 1410  AST 25 23  ALT 15 12  ALKPHOS 51 49  BILITOT 0.4 0.5  PROT 6.6 6.3  ALBUMIN 3.5 3.3*   No results found for this basename: LIPASE, AMYLASE,  in the last 168 hours No results found for this basename: AMMONIA,  in the last 168 hours  CBC:  Recent Labs Lab 07/19/14 0830 07/23/14 1410 07/24/14 0420 07/25/14 0322  WBC 7.2 9.3 9.7 10.4  NEUTROABS  --  7.5  --   --   HGB 9.5* 8.2* 8.4* 9.3*  HCT 31.2* 26.9* 26.8* 28.4*  MCV 110.2*  108.5* 103.5* 97.9  PLT 149* 178 153 143*    INR:  Recent Labs Lab 07/19/14 0830 07/23/14 1410 07/24/14 0420 07/25/14 0322  INR 1.74* 2.00* 2.15* 2.23*    Other results:    Imaging: Dg Chest Port 1 View  07/23/2014   CLINICAL DATA:  Shortness of breath, LVAD, open-heart surgery  EXAM: PORTABLE CHEST - 1 VIEW  COMPARISON:  09/13/2014  FINDINGS: Lungs are clear.  No pleural effusion or pneumothorax.  Cardiomegaly. Left subclavian ICD. LVAD. Postsurgical changes related to prior CABG.  IMPRESSION: No evidence of acute cardiopulmonary disease.  Postsurgical changes as above.   Electronically Signed   By: Julian Hy M.D.   On: 07/23/2014 15:02     Medications:     Scheduled Medications: . atorvastatin  40 mg Oral q1800  . budesonide-formoterol  2 puff Inhalation BID  . cholecalciferol   2,000 Units Oral QHS  . citalopram  40 mg Oral QHS  . docusate sodium  200 mg Oral QHS  . ferrous gluconate  324 mg Oral TID WC  . levothyroxine  50 mcg Oral QHS  . pantoprazole  40 mg Oral BID  . sodium chloride  3 mL Intravenous Q12H  . tiotropium  18 mcg Inhalation Daily  . cyanocobalamin  1,000 mcg Oral QHS  . vitamin C  500 mg Oral BID  . warfarin  5 mg Oral Once per day on Sun Mon Wed Thu Sat   And  . [START ON 07/26/2014] warfarin  7.5 mg Oral Once per day on Tue Fri  . Warfarin - Pharmacist Dosing Inpatient   Does not apply q1800    Infusions:    PRN Medications: sodium chloride, albuterol, nitroGLYCERIN, sodium chloride   Assessment/plan:   73 yo with CAD s/p CABG and ischemic cardiomyopathy s/p Heartmate II LVAD was admitted with increased exertional dyspnea. He was found to be anemic.  1. Exertional dyspnea 2. Anemia: d/t  Acute on chronic GI blood loss. Small bowel angioectasias found on capsule study in 6/15 likely explain. No overt bleeding however Hgb not really increasing with PRBCs. Hgb stable 9.3. Will watch one more day and if Hgb drops any will consult GI to consider EGD with push enteroscopy.  - He will remain off ASA but will have to stay on coumadin, goal INR 1.8-2.5. INR today 2.23 and pharmacy dosing.  3. Chronic systolic CHF: Ischemic CMP, has Heartmate II LVAD. Indices stable. Volume stable.  4. PAF 5. CAD s/p CABG   Consult CR for ambulation.   Length of Stay: 2  Rande Brunt NP-C 07/25/2014, 11:26 AM  VAD Team --- VAD ISSUES ONLY--- Pager [phone] (7am - 7am)  Advanced Heart Failure Team  Pager [phone] (M-F; 7a - 4p)  Please contact [facility] for night-coverage after hours (4p -7a ) and weekends on [email or web address]   Patient seen and examined with Junie Bame, NP. We discussed all aspects of the encounter. I agree with the assessment and plan as stated above. ]   Hgb improved but not as much as I would have expected. GI w/u from  earlier this year reviewed. Will watch one more day. If hgb stable can go home in am. If continues to drop will ask GI to see again.   VAD parameters are stable.   Daniel Bensimhon,MD 12:08 PM

## 2014-07-26 ENCOUNTER — Other Ambulatory Visit: Payer: Medicare Other

## 2014-07-26 DIAGNOSIS — K922 Gastrointestinal hemorrhage, unspecified: Secondary | ICD-10-CM

## 2014-07-26 LAB — CARBOXYHEMOGLOBIN

## 2014-07-26 LAB — BASIC METABOLIC PANEL
ANION GAP: 12 (ref 5–15)
BUN: 31 mg/dL — ABNORMAL HIGH (ref 6–23)
CALCIUM: 9 mg/dL (ref 8.4–10.5)
CO2: 22 meq/L (ref 19–32)
Chloride: 101 mEq/L (ref 96–112)
Creatinine, Ser: 0.79 mg/dL (ref 0.50–1.35)
GFR calc Af Amer: >90 mL/min (ref >90)
GFR calc non Af Amer: 88 mL/min — ABNORMAL LOW (ref >90)
Glucose, Bld: 140 mg/dL — ABNORMAL HIGH (ref 70–99)
POTASSIUM: 4.2 meq/L (ref 3.7–5.3)
SODIUM: 135 meq/L — AB (ref 137–147)

## 2014-07-26 LAB — CBC
HCT: 26.1 % — ABNORMAL LOW (ref 39.0–52.0)
HEMOGLOBIN: 8.3 g/dL — AB (ref 13.0–17.0)
MCH: 31.9 pg (ref 26.0–34.0)
MCHC: 31.8 g/dL (ref 30.0–36.0)
MCV: 100.4 fL — ABNORMAL HIGH (ref 78.0–100.0)
PLATELETS: 140 10*3/uL — AB (ref 150–400)
RBC: 2.6 MIL/uL — AB (ref 4.22–5.81)
RDW: 23.6 % — ABNORMAL HIGH (ref 11.5–15.5)
WBC: 10.1 10*3/uL (ref 4.0–10.5)

## 2014-07-26 LAB — PREPARE RBC (CROSSMATCH)

## 2014-07-26 MED ORDER — WARFARIN SODIUM 2 MG PO TABS
2.0000 mg | ORAL_TABLET | Freq: Once | ORAL | Status: AC
  Administered 2014-07-26: 2 mg via ORAL
  Filled 2014-07-26: qty 1

## 2014-07-26 MED ORDER — WARFARIN SODIUM 7.5 MG PO TABS: 7.5000 mg | ORAL_TABLET | ORAL | Status: DC

## 2014-07-26 MED ORDER — WARFARIN SODIUM 5 MG PO TABS
5.0000 mg | ORAL_TABLET | ORAL | Status: DC
  Filled 2014-07-26: qty 1

## 2014-07-26 MED ORDER — SODIUM CHLORIDE 0.9 % IV SOLN: Freq: Once | INTRAVENOUS | Status: DC

## 2014-07-26 NOTE — Progress Notes (Addendum)
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 Quit smoking 2003. Uses CPAP and O2 every night.   He is s/p LVAD HM II implanted 01/12/13 under DT criteria.   Admitted to [facility] 09/13/13 - 09/14/13 for increased fatigue and dyspnea. Found to have mild CHF and anemia. Diuresed with IV lasix and transitioned to PO lasix 40 mg twice a week. Discharge weight 203 lbs on inpatient scale. Admitting labs revealed Hgb 7.7 for which he received 2 units PCs with appropriate rise in Hgb.   Admit 6/8-6/12/15r symptomatic anemia. Had EGD/Colonoscopy which was unrevealing. 6 mm polyp clipped and removed. CT abdomen showed cirrhosis. Capsule study showed small bowel angioectasia that may have been source of slow GI blood loss.   Has received 4u PRBCs total. Hgb trending back down to 8.2. Also received Faraheme. No obvious bleeding source. Stools dark on iron but firm. Had 2 BMs yesterday. Feels weak. DOE. INR 2.2.  MAPs 80s.   HeartMate II LVAD: Flow: 5.5    Speed: 9200      Power: 6.0    PI:   4.7  occasional PI events       Objective:    Vital Signs:   Temp:  [97.6 F (36.4 C)-97.7 F (36.5 C)] 97.7 F (36.5 C) (10/27 0430) Pulse Rate:  [110] 110 (10/26 1530) SpO2:  [98 %] 98 % (10/27 0430) Weight:  [95.6 kg (210 lb 12.2 oz)] 95.6 kg (210 lb 12.2 oz) (10/27 0430) Last BM Date: 07/25/14 Mean arterial Pressure 78  Intake/Output:   Intake/Output Summary (Last 24 hours) at 07/26/14 0630 Last data filed at 07/25/14 2246  Gross per 24 hour  Intake    483 ml  Output      0 ml  Net    483 ml     Physical Exam:   General: Elderly,  NAD  Neck: No JVD, no thyromegaly or thyroid nodule.  Lungs: Clear to auscultation bilaterally with normal respiratory effort.  CV: LVAD hum present;  Abdomen: Soft, nontender, no hepatosplenomegaly, no  distention. Driveline dressing C/D/I Skin: Intact without lesions or rashes.  Neurologic: Alert and oriented x 3.  Psych: Normal affect.  Extremities: No clubbing or cyanosis. No edema. HEENT: Normal.   Telemetry: NSR  Labs: Basic Metabolic Panel:  Recent Labs Lab 07/19/14 0830 07/23/14 1410 07/24/14 0420 07/25/14 0322 07/26/14 0525  NA 139 131* 136* 135* 135*  K 4.3 4.5 3.9 4.1 4.2  CL 105 99 102 102 101  CO2 24 22 24 21 22   GLUCOSE 129* 141* 100* 117* 140*  BUN 14 29* 30* 33* 31*  CREATININE 0.79 0.81 0.77 0.79 0.79  CALCIUM 9.6 9.4 9.1 9.3 9.0    Liver Function Tests:  Recent Labs Lab 07/19/14 0830 07/23/14 1410  AST 25 23  ALT 15 12  ALKPHOS 51 49  BILITOT 0.4 0.5  PROT 6.6 6.3  ALBUMIN 3.5 3.3*   No results found for this basename: LIPASE, AMYLASE,  in the last 168 hours No results found for this basename: AMMONIA,  in the last 168 hours  CBC:  Recent Labs Lab 07/19/14 0830 07/23/14 1410 07/24/14 0420 07/25/14 0322 07/26/14 0525  WBC 7.2 9.3 9.7 10.4 10.1  NEUTROABS  --  7.5  --   --   --   HGB 9.5* 8.2* 8.4* 9.3* 8.3*  HCT 31.2* 26.9* 26.8* 28.4* 26.1*  MCV 110.2* 108.5* 103.5* 97.9 100.4*  PLT 149* 178 153 143* 140*    INR:  Recent Labs Lab 07/19/14 0830 07/23/14 1410 07/24/14 0420 07/25/14 0322  INR 1.74* 2.00* 2.15* 2.23*    Other results:    Imaging: No results found.   Medications:     Scheduled Medications: . atorvastatin  40 mg Oral q1800  . budesonide-formoterol  2 puff Inhalation BID  . cholecalciferol  2,000 Units Oral QHS  . citalopram  40 mg Oral QHS  . docusate sodium  200 mg Oral QHS  . ferrous gluconate  324 mg Oral TID WC  . levothyroxine  50 mcg Oral QHS  . pantoprazole  40 mg Oral BID  . sodium chloride  3 mL Intravenous Q12H  . tiotropium  18 mcg Inhalation Daily  . cyanocobalamin  1,000 mcg Oral QHS  . vitamin C  500 mg Oral BID  . warfarin  5 mg Oral Once per day on Sun Mon Wed Thu Sat   And   . warfarin  7.5 mg Oral Once per day on Tue Fri  . Warfarin - Pharmacist Dosing Inpatient   Does not apply q1800    Infusions:    PRN Medications: sodium chloride, albuterol, nitroGLYCERIN, sodium chloride   Assessment/plan:   73 yo with CAD s/p CABG and ischemic cardiomyopathy s/p Heartmate II LVAD was admitted with increased exertional dyspnea. He was found to be anemic.  1. Exertional dyspnea 2. Anemia: d/t  Acute on chronic GI blood loss. Small bowel angioectasias found on capsule study in 6/15 likely explain. No overt bleeding however Hgb continues to drop. Will keep NPO and will consult GI to consider EGD with push enteroscopy. Transfuse 2 more units RBCs. - He will remain off ASA but will have to stay on coumadin, goal INR 1.8-2.5.  3. Chronic systolic CHF: Ischemic CMP, has Heartmate II LVAD. Indices stable. Volume stable.  4. PAF 5. CAD s/p CABG  Length of Stay: 3  Glori Bickers MD 07/26/2014, 6:30 AM  VAD Team --- VAD ISSUES ONLY--- Pager [phone] (7am - 7am)  Advanced Heart Failure Team  Pager [phone] (M-F; 7a - 4p)  Please contact [facility] for night-coverage after hours (4p -7a ) and weekends on [email or web address]

## 2014-07-26 NOTE — Consult Note (Signed)
[facility] Consult: 8:25 AM 07/26/2014  LOS: 3 days    Referring Provider: Dr Haroldine Laws  Primary Care Physician:  Thressa Sheller, MD Primary Gastroenterologist:  Dr. Deatra Ina     Reason for Consultation:  GI bleed   HPI: [name] is a 73 y.o. male. CAD s/p CABG (3785), chronic systolic HF s/p ICD, hyperlipidemia, hypothyroidism, emphysema, OSA, and PAF. Quit smoking 2003.  On CPAP and O2 every night.  S/P "destination" LVAD 01/12/13.  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  Transfused 2 units PRBCs for symptomatic anemia (Hgb 7.7) in 08/2013.  Admission 6/8 - 6/12 with transfusion requiring (2 units) anemia.  EGD/Colonoscopy:  6 mm polyp clipped and removed, internal rrhoids, mild   Erosions at [facility]. . CT abdomen showed cirrhosis. Capsule endo showed gastric and small bowel angioectasia, inflammation at 2 hours 40 minutes.  ASA was discontinued; Protonix, tid iron, Coumadin continued.   Admitted 10/24 with recurrent symptomatic anemia: dyspnea, fatigue.  Hgb had gone from 11.2 (4 weeks ago) to 9.5 (4 days PTA) to 8.2 at admission. 4 units PRBCs transfused thus far. INR 2.2 at admission and had been kept in range of 2-2.5. The goal rate is now 1.8-2.5.  Was also given Feraheme infusion 10/25.  Pt's dyspnea is improved initially but has recurred in last few days and Hgb went from 8.4 to 9.3 to 8.3 in last 48 hours.  When SOB he becomes nauseated but no vomiting.  Interestingly he is markedly macrocytic with MCV as high as 110 on 10/20  Stools have been formed, dark but he is taking po iron.  No obvious GIB.   Dr B wondering if pt should have push enteroscopy.    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  At 2\' 40"  there was  mild inflammation with erythema and shallow erosions.   03/09/14  Colonoscopy 2 mm transverse colon polyp not removed.  36mm descending polyp cold snared and site clipped.  Internal hemorrhoids.   03/09/14  EGD Superficial erosions at [facility].   Past Medical History  Diagnosis Date  . Ischemic cardiomyopathy      CABG 2003, PCI 2007  EF 27%(myoview 2012  . Chronic systolic heart failure   . Hyperlipidemia   . Hypothyroidism   . Chronic anticoagulation     Afib and LVAD  . Obesity   . COPD (chronic obstructive pulmonary disease)   . Asbestosis(501)     "6 years in the [place]" (05/24/2013)  . Atrial fibrillation     permanent  . Paroxysmal ventricular tachycardia   . Coronary artery disease   . Implantable cardioverter-defibrillator [employer]   . Hypertension   . Myocardial infarction 1990's-2000    "2 in  ~ the 1990's; 1 in ~ 2000" (05/24/2013)  . OSA on CPAP   . Depression   . LVAD (left ventricular assist device) present 12/2012    Past Surgical History  Procedure Laterality Date  . Coronary artery bypass graft  2003    "?  X4" (05/24/2013)  . Cardiac defibrillator placement  2004; ~ 2010  . Insertion of implantable left ventricular assist device N/A 01/12/2013    Procedure: INSERTION OF IMPLANTABLE LEFT VENTRICULAR ASSIST DEVICE;  Surgeon: Ivin Poot, MD;  Location: [facility];  Service: Open Heart Surgery;  Laterality: N/A;   nitric oxide; Redo sternotomy  . Intraoperative transesophageal echocardiogram N/A 01/12/2013    Procedure: INTRAOPERATIVE TRANSESOPHAGEAL ECHOCARDIOGRAM;  Surgeon: Ivin Poot, MD;  Location: [facility];  Service: Open Heart Surgery;  Laterality: N/A;  . Colonoscopy N/A 03/09/2014    Procedure: COLONOSCOPY;  Surgeon: Inda Castle, MD;  Location: [facility];  Service: Endoscopy;  Laterality: N/A;  LVAD  patient  . Esophagogastroduodenoscopy N/A 03/09/2014    Procedure: ESOPHAGOGASTRODUODENOSCOPY (EGD);  Surgeon: Inda Castle, MD;  Location: [facility];   Service: Endoscopy;  Laterality: N/A;  . Givens capsule study N/A 03/10/2014    Procedure: GIVENS CAPSULE STUDY;  Surgeon: Inda Castle, MD;  Location: [facility];  Service: Endoscopy;  Laterality: N/A;    Prior to Admission medications   Medication Sig Start Date End Date Taking? Authorizing Provider  budesonide-formoterol (SYMBICORT) 160-4.5 MCG/ACT inhaler Inhale 2 puffs into the lungs 2 (two) times daily.   Yes Historical Provider, MD  cholecalciferol (VITAMIN D) 1000 UNITS tablet Take 2,000 Units by mouth at bedtime.    Yes Historical Provider, MD  citalopram (CELEXA) 40 MG tablet Take 40 mg by mouth at bedtime.   Yes Historical Provider, MD  cyanocobalamin 1000 MCG tablet Take 1,000 mcg by mouth at bedtime.    Yes Historical Provider, MD  docusate sodium (COLACE) 100 MG capsule Take 200 mg by mouth at bedtime.    Yes Historical Provider, MD  ferrous gluconate (FERGON) 324 MG tablet Take 324 mg by mouth 3 (three) times daily with meals.   Yes Historical Provider, MD  furosemide (LASIX) 40 MG tablet Take 40 mg by mouth daily as needed (fluid retention).   Yes Historical Provider, MD  levothyroxine (SYNTHROID, LEVOTHROID) 100 MCG tablet Take 50 mcg by mouth at bedtime.    Yes Historical Provider, MD  losartan (COZAAR) 50 MG tablet Take 1 tablet (50 mg total) by mouth daily. 04/06/14  Yes Rande Brunt, NP  Multiple Vitamins-Minerals (MULTIVITAMIN PO) Take 1 tablet by mouth daily.   Yes Historical Provider, MD  pantoprazole (PROTONIX) 40 MG tablet Take 1 tablet (40 mg total) by mouth 2 (two) times daily. 03/11/14  Yes Rande Brunt, NP  potassium chloride (K-DUR) 10 MEQ tablet Take 10 mEq by mouth daily as needed (when taking Furosemide).   Yes Historical Provider, MD  simvastatin (ZOCOR) 80 MG tablet Take 40 mg by mouth at bedtime.     Yes Historical Provider, MD  tiotropium (SPIRIVA) 18 MCG inhalation capsule Place 18 mcg into inhaler and inhale daily.    Yes Historical Provider, MD    vitamin C (ASCORBIC ACID) 500 MG tablet Take 500 mg by mouth 2 (two) times daily.    Yes Historical Provider, MD  warfarin (COUMADIN) 5 MG tablet Take 5-7.5 mg by mouth daily. 7.5mg  on Tuesday and Friday, 5mg  all other days   Yes Historical Provider, MD  albuterol (PROVENTIL) (2.5 MG/3ML) 0.083% nebulizer solution Take 2.5 mg by nebulization every 6 (six) hours as needed for wheezing or shortness of breath.     Historical Provider, MD  nitroGLYCERIN (NITROSTAT) 0.4 MG SL tablet Place 0.4 mg under the tongue every 5 (five) minutes as needed for  chest pain.     Historical Provider, MD    Scheduled Meds: . sodium chloride   Intravenous Once  . atorvastatin  40 mg Oral q1800  . budesonide-formoterol  2 puff Inhalation BID  . cholecalciferol  2,000 Units Oral QHS  . citalopram  40 mg Oral QHS  . docusate sodium  200 mg Oral QHS  . ferrous gluconate  324 mg Oral TID WC  . levothyroxine  50 mcg Oral QHS  . pantoprazole  40 mg Oral BID  . sodium chloride  3 mL Intravenous Q12H  . tiotropium  18 mcg Inhalation Daily  . cyanocobalamin  1,000 mcg Oral QHS  . vitamin C  500 mg Oral BID  . warfarin  5 mg Oral Once per day on Sun Mon Wed Thu Sat   And  . warfarin  7.5 mg Oral Once per day on Tue Fri  . Warfarin - Pharmacist Dosing Inpatient   Does not apply q1800   Infusions:   PRN Meds: sodium chloride, albuterol, nitroGLYCERIN, sodium chloride   Allergies as of 07/23/2014  . (No Known Allergies)    Family History  Problem Relation Age of Onset  . Heart attack Mother   . Heart attack Father     History   Social History  . Marital Status: Married    Spouse Name: N/A    Number of Children: N/A  . Years of Education: N/A   Occupational History  . Not on file.   Social History Main Topics  . Smoking status: Former Smoker -- 2.00 packs/day for 45 years    Types: Cigarettes    Quit date: 11/11/2001  . Smokeless tobacco: Never Used  . Alcohol Use: No     Comment: 05/24/2013  "use to drink beer; hardly nothing since 2003; once in awhile a beer "  . Drug Use: No  . Sexual Activity: No   Other Topics Concern  . Not on file   Social History Narrative  . No narrative on file    REVIEW OF SYSTEMS: Constitutional:  Some fatigue and malaise when dyspneic ENT:  No nose bleeds.  Wears hearing aid. Pulm:  Per HPI.  No cough CV:  No palpitations, no LE edema. No chest pain GU:  No hematuria, no frequency GI:  Per hpi.  No dysphagia Heme:  Per HPI   Transfusions:  Per HPI Neuro:  No headaches, no peripheral tingling or numbness Derm:  No itching, no rash or sores.  Endocrine:  No sweats or chills.  No polyuria or dysuria Immunization:  Not queried Travel:  None beyond local counties in last few months.    PHYSICAL EXAM: Vital signs in last 24 hours: Filed Vitals:   07/26/14 0430  Pulse:   Temp: 97.7 F (36.5 C)  Resp:    Wt Readings from Last 3 Encounters:  07/26/14 210 lb 12.2 oz (95.6 kg)  07/19/14 225 lb (102.059 kg)  05/17/14 225 lb (102.059 kg)    General: comfortable, somwhat chronically ill looking WM.  Head:  No asymmetry or swelling  Eyes:  No icterus or pallor Ears:  HOH  Nose:  No congestion or discharge Mouth:  Clear, moist.  No blood.  Neck:  No mass, no JVD Lungs:  Clear bil.  No labored resps.  No cough Heart: hum of LVAD audible.  Abdomen:  Soft, NT, ND.  No mass.  LVAD drive line inserted on left.  Site looks benign. .   Rectal: no stool  in vault   Musc/Skeltl: no joint swelling or redness Extremities:  No CCE  Neurologic:  Oriented x 3.  No tremor, no limb wekness Skin:  Tanned, sun leathered Tattoos:  On arms Nodes:  No cervical adenopathy   Psych:  Pleasant, relaxed, engaged.    LAB RESULTS:  Recent Labs  07/24/14 0420 07/25/14 0322 07/26/14 0525  WBC 9.7 10.4 10.1  HGB 8.4* 9.3* 8.3*  HCT 26.8* 28.4* 26.1*  PLT 153 143* 140*  MCV    103                             BMET Lab Results  Component Value Date    NA 135* 07/26/2014   NA 135* 07/25/2014   NA 136* 07/24/2014   K 4.2 07/26/2014   K 4.1 07/25/2014   K 3.9 07/24/2014   CL 101 07/26/2014   CL 102 07/25/2014   CL 102 07/24/2014   CO2 22 07/26/2014   CO2 21 07/25/2014   CO2 24 07/24/2014   GLUCOSE 140* 07/26/2014   GLUCOSE 117* 07/25/2014   GLUCOSE 100* 07/24/2014   BUN 31* 07/26/2014   BUN 33* 07/25/2014   BUN 30* 07/24/2014   CREATININE 0.79 07/26/2014   CREATININE 0.79 07/25/2014   CREATININE 0.77 07/24/2014   CALCIUM 9.0 07/26/2014   CALCIUM 9.3 07/25/2014   CALCIUM 9.1 07/24/2014   LFT  Recent Labs  07/23/14 1410  PROT 6.3  ALBUMIN 3.3*  AST 23  ALT 12  ALKPHOS 49  BILITOT 0.5   PT/INR Lab Results  Component Value Date   INR 2.23* 07/25/2014   INR 2.15* 07/24/2014   INR 2.00* 07/23/2014    Ref. Range 03/11/2014 04:30 07/24/2014 00:48  Iron Latest Range: 42-135 ug/dL  64  UIBC Latest Range: 125-400 ug/dL  260  TIBC Latest Range: 215-435 ug/dL  324  Saturation Ratios Latest Range: 20-55 %  20  Ferritin Latest Range: 22-322 ng/mL  22  RBC Folate Latest Range: >280 ng/mL 557     IMPRESSION:   *  Recurrent transfusion requiring anemia in pt with 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  Treatment complicated by his s/p LVAD status and need for life-long Coumadin. He is macrocytic and B12 level has not been assayed.      PLAN:     *  Set up for enteroscopy on 10/28 @ 1315.  Discussed with pt and spouse, they are agreeable.  *  Check B 12 level.    Azucena Freed  07/26/2014, 8:25 AM Pager: [phone]     [facility] Attending  I have also seen and assessed the patient and agree with the above note. Capsule endo images also reviewed - very subtle findings of ? Significance though understand these patients can bleed from subtle lesions.  B12 level will be checked - macrocytosis could be from that but he says he takes oral B12.  Macrocytosis could be a reticulocytosis  also.  If B12 level low may not need enteroscopy but since has not had a push enteroscopy that is reasonable. The risks and benefits as well as alternatives of endoscopic procedure(s) have been discussed and reviewed. All questions answered. The patient agrees to proceed.  Will need to see what INR is also - around 2 ok but if sig higher impairs ability to treat/ablate lesions  Gatha Mayer, MD, [facility] [phone] (pager) 07/26/2014 5:15 PM

## 2014-07-26 NOTE — Progress Notes (Signed)
ANTICOAGULATION CONSULT NOTE - Follow Up Consult  Pharmacy Consult for coumadin Indication: LVAD  No Known Allergies  Patient Measurements: Height: 6' (182.9 cm) Weight: 210 lb 12.2 oz (95.6 kg) IBW/kg (Calculated) : 77.6   Vital Signs: Temp: 98.4 F (36.9 C) (10/27 0910) Temp Source: Oral (10/27 0910) Pulse Rate: 77 (10/27 0910)  Labs:  Recent Labs  07/23/14 1410 07/24/14 0420 07/25/14 0322 07/26/14 0525  HGB 8.2* 8.4* 9.3* 8.3*  HCT 26.9* 26.8* 28.4* 26.1*  PLT 178 153 143* 140*  LABPROT 22.9* 24.2* 24.9*  --   INR 2.00* 2.15* 2.23*  --   CREATININE 0.81 0.77 0.79 0.79    Estimated Creatinine Clearance: 100.1 ml/min (by C-G formula based on Cr of 0.79).   Medications:  Prescriptions prior to admission  Medication Sig Dispense Refill  . budesonide-formoterol (SYMBICORT) 160-4.5 MCG/ACT inhaler Inhale 2 puffs into the lungs 2 (two) times daily.      . cholecalciferol (VITAMIN D) 1000 UNITS tablet Take 2,000 Units by mouth at bedtime.       . citalopram (CELEXA) 40 MG tablet Take 40 mg by mouth at bedtime.      . cyanocobalamin 1000 MCG tablet Take 1,000 mcg by mouth at bedtime.       . docusate sodium (COLACE) 100 MG capsule Take 200 mg by mouth at bedtime.       . ferrous gluconate (FERGON) 324 MG tablet Take 324 mg by mouth 3 (three) times daily with meals.      . furosemide (LASIX) 40 MG tablet Take 40 mg by mouth daily as needed (fluid retention).      [name] levothyroxine (SYNTHROID, LEVOTHROID) 100 MCG tablet Take 50 mcg by mouth at bedtime.       [name] losartan (COZAAR) 50 MG tablet Take 1 tablet (50 mg total) by mouth daily.  90 tablet  3  . Multiple Vitamins-Minerals (MULTIVITAMIN PO) Take 1 tablet by mouth daily.      . pantoprazole (PROTONIX) 40 MG tablet Take 1 tablet (40 mg total) by mouth 2 (two) times daily.  60 tablet  3  . potassium chloride (K-DUR) 10 MEQ tablet Take 10 mEq by mouth daily as needed (when taking Furosemide).      . simvastatin (ZOCOR) 80  MG tablet Take 40 mg by mouth at bedtime.        [name] tiotropium (SPIRIVA) 18 MCG inhalation capsule Place 18 mcg into inhaler and inhale daily.       . vitamin C (ASCORBIC ACID) 500 MG tablet Take 500 mg by mouth 2 (two) times daily.       [name] warfarin (COUMADIN) 5 MG tablet Take 5-7.5 mg by mouth daily. 7.5mg  on Tuesday and Friday, 5mg  all other days      . albuterol (PROVENTIL) (2.5 MG/3ML) 0.083% nebulizer solution Take 2.5 mg by nebulization every 6 (six) hours as needed for wheezing or shortness of breath.       . nitroGLYCERIN (NITROSTAT) 0.4 MG SL tablet Place 0.4 mg under the tongue every 5 (five) minutes as needed for chest pain.         Assessment: 73 yo man continuing coumadin for LVAD.  His INR remains in goal range on home dose.  His Hg had trended down and received 2 units pRBCs. No complications noted, patient's INR had been stable with no INR check today.   Plan is for enteroscopy tomorrow per GI, will give low dose coumadin tonight to keep INR at low  end.  Goal of Therapy:  INR 1.8-2.5 Monitor platelets by anticoagulation protocol: Yes   Plan:  Warfarin 2mg  tonight INR check in am  Thanks for allowing pharmacy to be a part of this patient's care.  Erin Hearing PharmD., BCPS Clinical Pharmacist Pager [phone] 07/26/2014 11:26 AM

## 2014-07-26 NOTE — Care Management Note (Signed)
    Page 1 of 1   08/04/2014     4:52:34 PM CARE MANAGEMENT NOTE 08/04/2014  Patient:  [name], [name]   Account Number:  [id number]  Date Initiated:  07/26/2014  Documentation initiated by:  Aldrick Derrig  Subjective/Objective Assessment:   Pt with LVAD adm with symptomatic anemia on 07/23/14.  PTA, pt independent, lives with spouse.     Action/Plan:   Will follow for dc needs as pt progresses.  Endo tomorrow.   Anticipated DC Date:  08/04/2014   Anticipated DC Plan:  [place]  CM consult      Choice offered to / List presented to:             Status of service:  Completed, signed off Medicare Important Message given?  YES (If response is "NO", the following Medicare IM given date fields will be blank) Date Medicare IM given:  07/26/2014 Medicare IM given by:  Leopold Smyers Date Additional Medicare IM given:  08/04/2014 Additional Medicare IM given by:  Darshay Deupree  Discharge Disposition:  HOME/SELF CARE  Per UR Regulation:  Reviewed for med. necessity/level of care/duration of stay  If discussed at [place] of Stay Meetings, dates discussed:   08/02/2014    Comments:  08/02/14 Ellan Lambert, RN, BSN [phone] Bleeding source identified by GI and addressed.  Pt for possible dc home tomorrow with spouse, per MD notes.

## 2014-07-26 NOTE — Progress Notes (Signed)
CARDIAC REHAB PHASE I   PRE:  Rate/Rhythm: 85 afib  BP:  Supine:   Sitting: 82 map  Standing:    SaO2: 97%RA  MODE:  Ambulation: 150 ft   POST:  Rate/Rhythm: 90 afib  To 82 SR  BP:  Supine:   Sitting: 110 map  Standing:    SaO2: 96%RA 1410-1435 Pt walked 150 ft on RA with asst x 1 with steady gait. Stopped once to lean on desk to rest. Pt very tired by end of walk. To sitting on side of bed. MAP elevated to 110. Notified pt's RN.   Graylon Good, RN BSN  07/26/2014 2:30 PM

## 2014-07-27 ENCOUNTER — Encounter (HOSPITAL_COMMUNITY): Payer: Self-pay | Admitting: *Deleted

## 2014-07-27 ENCOUNTER — Encounter (HOSPITAL_COMMUNITY)
Admission: EM | Disposition: A | Discharge status: Home / Self Care | Payer: Self-pay | Source: Home / Self Care | Attending: Cardiology

## 2014-07-27 ENCOUNTER — Inpatient Hospital Stay (HOSPITAL_COMMUNITY): Payer: Non-veteran care | Admitting: Anesthesiology

## 2014-07-27 ENCOUNTER — Encounter (HOSPITAL_COMMUNITY): Payer: Non-veteran care | Admitting: Anesthesiology

## 2014-07-27 DIAGNOSIS — D509 Iron deficiency anemia, unspecified: Secondary | ICD-10-CM

## 2014-07-27 HISTORY — PX: ENTEROSCOPY: SHX5533

## 2014-07-27 LAB — TYPE AND SCREEN
ABO/RH(D): O POS
Antibody Screen: NEGATIVE
UNIT DIVISION: 0
Unit division: 0
Unit division: 0
Unit division: 0
Unit division: 0
Unit division: 0

## 2014-07-27 LAB — PROTIME-INR
INR: 2.6 — AB (ref 0.00–1.49)
Prothrombin Time: 28 seconds — ABNORMAL HIGH (ref 11.6–15.2)

## 2014-07-27 LAB — BASIC METABOLIC PANEL
ANION GAP: 12 (ref 5–15)
BUN: 23 mg/dL (ref 6–23)
CHLORIDE: 102 meq/L (ref 96–112)
CO2: 23 mEq/L (ref 19–32)
Calcium: 9.3 mg/dL (ref 8.4–10.5)
Creatinine, Ser: 0.79 mg/dL (ref 0.50–1.35)
GFR calc Af Amer: >90 mL/min (ref >90)
GFR calc non Af Amer: 88 mL/min — ABNORMAL LOW (ref >90)
Glucose, Bld: 111 mg/dL — ABNORMAL HIGH (ref 70–99)
POTASSIUM: 3.9 meq/L (ref 3.7–5.3)
Sodium: 137 mEq/L (ref 137–147)

## 2014-07-27 LAB — CBC
HCT: 30.9 % — ABNORMAL LOW (ref 39.0–52.0)
Hemoglobin: 9.8 g/dL — ABNORMAL LOW (ref 13.0–17.0)
MCH: 31.6 pg (ref 26.0–34.0)
MCHC: 31.7 g/dL (ref 30.0–36.0)
MCV: 99.7 fL (ref 78.0–100.0)
PLATELETS: 138 10*3/uL — AB (ref 150–400)
RBC: 3.1 MIL/uL — ABNORMAL LOW (ref 4.22–5.81)
RDW: 24.6 % — ABNORMAL HIGH (ref 11.5–15.5)
WBC: 10.4 10*3/uL (ref 4.0–10.5)

## 2014-07-27 LAB — VITAMIN B12: VITAMIN B 12: 890 pg/mL (ref 211–911)

## 2014-07-27 SURGERY — ENTEROSCOPY
Anesthesia: Monitor Anesthesia Care

## 2014-07-27 MED ORDER — PROPOFOL 10 MG/ML IV BOLUS
INTRAVENOUS | Status: DC | PRN
  Administered 2014-07-27: 40 mg via INTRAVENOUS
  Administered 2014-07-27: 20 mg via INTRAVENOUS
  Administered 2014-07-27: 40 mg via INTRAVENOUS
  Administered 2014-07-27 (×2): 20 mg via INTRAVENOUS

## 2014-07-27 MED ORDER — FENTANYL CITRATE 0.05 MG/ML IJ SOLN: 25.0000 ug | INTRAMUSCULAR | Status: DC | PRN

## 2014-07-27 MED ORDER — LOSARTAN POTASSIUM 25 MG PO TABS
25.0000 mg | ORAL_TABLET | Freq: Every day | ORAL | Status: DC
  Administered 2014-07-28 – 2014-08-04 (×6): 25 mg via ORAL
  Filled 2014-07-27 (×8): qty 1

## 2014-07-27 MED ORDER — LOSARTAN POTASSIUM 25 MG PO TABS: 25.0000 mg | ORAL_TABLET | Freq: Every day | ORAL | Status: DC

## 2014-07-27 MED ORDER — SODIUM CHLORIDE 0.9 % IV SOLN
INTRAVENOUS | Status: DC | PRN
  Administered 2014-07-27: 13:00:00 via INTRAVENOUS

## 2014-07-27 MED ORDER — OXYCODONE HCL 5 MG/5ML PO SOLN: 5.0000 mg | Freq: Once | ORAL | Status: AC | PRN

## 2014-07-27 MED ORDER — WARFARIN SODIUM 2 MG PO TABS
2.0000 mg | ORAL_TABLET | Freq: Once | ORAL | Status: AC
  Administered 2014-07-27: 2 mg via ORAL
  Filled 2014-07-27 (×2): qty 1

## 2014-07-27 MED ORDER — ONDANSETRON HCL 4 MG/2ML IJ SOLN: 4.0000 mg | Freq: Once | INTRAMUSCULAR | Status: AC | PRN

## 2014-07-27 MED ORDER — OXYCODONE HCL 5 MG PO TABS: 5.0000 mg | ORAL_TABLET | Freq: Once | ORAL | Status: AC | PRN

## 2014-07-27 NOTE — Op Note (Signed)
[facility] [place], [id number]   ENTEROSCOPY PROCEDURE REPORT     EXAM DATE: 07/27/2014  PATIENT NAME:      Wood, Ryan           MR #:      [id number]  BIRTHDATE:       Jun 15, 1941      VISIT #:     [phone]  ATTENDING:     Gatha Mayer, MD, [facility]     STATUS:     inpatient  ASSISTANT:      Sharon Mt and Verlon Au  ASA CLASS:        Class III  INDICATIONS:  The patient is a 73 yr old male here for an enteroscopy procedure due to heme + chronic blood loss anemia , ? 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 PROCEDURE PERFORMED:     Diagnostic small bowel enteroscopy  MEDICATIONS:     Per Anesthesia  CONSENT: The patient understands the risks and benefits of the procedure and understands that these risks include, but are not limited to: sedation, allergic reaction, infection, perforation and/or bleeding. Alternative means of evaluation and treatment include, among others: physical exam, x-rays, and/or surgical intervention. The patient elects to proceed with this endoscopic procedure.  DESCRIPTION OF PROCEDURE: During intra-op preparation period all mechanical & medical equipment was checked for proper function. Hand hygiene and appropriate measures for infection prevention was taken. After the risks, benefits and alternatives of the procedure were thoroughly explained, Informed consent was verified, confirmed and timeout was successfully executed by the treatment team. The [id number] ([id number]) endoscope was introduced through the mouth and advanced to the proximal jejunum jejunum. The prep was The overall prep quality was excellent.. The instrument was then slowly withdrawn while examining the mucosa circumferentially. The scope was then completely withdrawn from the patient and the procedure terminated. The pulse, BP, and O2 saturation were monitored and documented by the anesthesia staff throughout the  entire procedure.  The patient was cared for as planned according to standard protocol, then discharged to recovery in stable condition and with appropriate post procedure care.  Irregular Z-line and small hiatal hernia but otherwise normal esophagus, stomach, duodenum and proximal jejunum.    ADVERSE EVENTS:      There were no immediate complications.  IMPRESSIONS:     Irregular Z-line and small hiatal hernia but otherwise normal esophagus, stomach, duodenum and proximal jejunum   RECOMMENDATIONS:     Continue supportive care. See GI prn    Gatha Mayer, MD, [facility] eSigned:  Gatha Mayer, MD, [facility] 07/27/2014 2:06 PM

## 2014-07-27 NOTE — Anesthesia Postprocedure Evaluation (Signed)
  Anesthesia Post-op Note  Patient: [name]  Procedure(s) Performed: Procedure(s) with comments: ENTEROSCOPY (N/A) - LVAD patient   Patient Location: Endoscopy Unit  Anesthesia Type: MAC  Level of Consciousness: awake, alert  and oriented  Airway and Oxygen Therapy: Patient Spontanous Breathing and Patient connected to nasal cannula oxygen  Post-op Pain: none  Post-op Assessment: Post-op Vital signs reviewed, Patient's Cardiovascular Status Stable, Respiratory Function Stable, Patent Airway, No signs of Nausea or vomiting and Pain level controlled  Post-op Vital Signs: stable  Last Vitals:  Filed Vitals:   07/27/14 1446  BP:   Pulse:   Temp: 36.4 C  Resp:     Complications: No apparent anesthesia complications

## 2014-07-27 NOTE — Anesthesia Preprocedure Evaluation (Addendum)
Anesthesia Evaluation  Patient identified by MRN, date of birth, ID band Patient awake    Reviewed: Allergy & Precautions, H&P , NPO status , Patient's Chart, lab work & pertinent test results  Airway Mallampati: II  TM Distance: >3 FB Neck ROM: Full    Dental  (+) Edentulous Upper, Edentulous Lower, Upper Dentures, Lower Dentures, Dental Advidsory Given   Pulmonary sleep apnea , COPD COPD inhaler, former smoker,  breath sounds clear to auscultation        Cardiovascular hypertension, Rhythm:Regular Rate:Normal     Neuro/Psych    GI/Hepatic PUD,   Endo/Other    Renal/GU      Musculoskeletal   Abdominal   Peds  Hematology  (+) anemia ,   Anesthesia Other Findings   Reproductive/Obstetrics                            Anesthesia Physical Anesthesia Plan  ASA: III  Anesthesia Plan: MAC   Post-op Pain Management:    Induction:   Airway Management Planned:   Additional Equipment:   Intra-op Plan:   Post-operative Plan:   Informed Consent:   Dental Advisory Given  Plan Discussed with: CRNA and Anesthesiologist  Anesthesia Plan Comments: (73 year old male with ischemic cardiomyopathy S/P  Heartmate II LVAD insertion 01/02/13 now with symptomatic anemia needs upper endoscopy.   Plan MAC, manual BP cuff with doppler.  Roberts Gaudy)       Anesthesia Quick Evaluation

## 2014-07-27 NOTE — Transfer of Care (Signed)
Immediate Anesthesia Transfer of Care Note  Patient: [name]  Procedure(s) Performed: Procedure(s) with comments: ENTEROSCOPY (N/A) - LVAD patient   Patient Location: Endoscopy Unit  Anesthesia Type:MAC  Level of Consciousness: awake, alert  and oriented  Airway & Oxygen Therapy: Patient Spontanous Breathing and Patient connected to nasal cannula oxygen  Post-op Assessment: Report given to PACU RN, Post -op Vital signs reviewed and stable and Patient moving all extremities X 4  Post vital signs: Reviewed and stable  Complications: No apparent anesthesia complications

## 2014-07-27 NOTE — Progress Notes (Signed)
CARDIAC REHAB PHASE I   PRE:  Rate/Rhythm: 74 SR  BP:  Supine:   Sitting: 78/ dopplered  Standing:    SaO2: 96 RA  MODE:  Ambulation: 375 ft   POST:  Rate/Rhythm: 89 SR    BP:  Supine:   Sitting: 102/ dopplered  Standing:    SaO2: 96 RA 1115-1140 Assisted X 1 to ambulate. Pt able to walk 375 feet today. Stated that he feels better today. VS stable Pt back to bed after walk with call light in reach. Pt states he is ready for something to eat.  Rodney Langton RN 07/27/2014 11:37 AM

## 2014-07-27 NOTE — Progress Notes (Signed)
Accompanied LVAD patient to endoscopy. CRNA present and Dr. Linna Caprice. MAPs remained above 70 throughout procedure and he tolerated well. MAP after procedure 88 and patient AxOx3. Will transfer back to the unit. Denies any complications.    Junie Bame B NP-C 2:08 PM

## 2014-07-27 NOTE — Progress Notes (Signed)
ANTICOAGULATION CONSULT NOTE - Follow Up Consult  Pharmacy Consult for coumadin Indication: LVAD  No Known Allergies  Patient Measurements: Height: 6' (182.9 cm) Weight: 209 lb 14.1 oz (95.2 kg) IBW/kg (Calculated) : 77.6   Vital Signs: Temp: 98 F (36.7 C) (10/28 0524) Temp Source: Oral (10/28 0524) Pulse Rate: 77 (10/28 0524)  Labs:  Recent Labs  07/25/14 0322 07/26/14 0525 07/27/14 0455  HGB 9.3* 8.3* 9.8*  HCT 28.4* 26.1* 30.9*  PLT 143* 140* 138*  LABPROT 24.9*  --  28.0*  INR 2.23*  --  2.60*  CREATININE 0.79 0.79 0.79    Estimated Creatinine Clearance: 99.9 ml/min (by C-G formula based on Cr of 0.79).   Medications:  Prescriptions prior to admission  Medication Sig Dispense Refill  . budesonide-formoterol (SYMBICORT) 160-4.5 MCG/ACT inhaler Inhale 2 puffs into the lungs 2 (two) times daily.      . cholecalciferol (VITAMIN D) 1000 UNITS tablet Take 2,000 Units by mouth at bedtime.       . citalopram (CELEXA) 40 MG tablet Take 40 mg by mouth at bedtime.      . cyanocobalamin 1000 MCG tablet Take 1,000 mcg by mouth at bedtime.       . docusate sodium (COLACE) 100 MG capsule Take 200 mg by mouth at bedtime.       . ferrous gluconate (FERGON) 324 MG tablet Take 324 mg by mouth 3 (three) times daily with meals.      . furosemide (LASIX) 40 MG tablet Take 40 mg by mouth daily as needed (fluid retention).      [name] levothyroxine (SYNTHROID, LEVOTHROID) 100 MCG tablet Take 50 mcg by mouth at bedtime.       [name] losartan (COZAAR) 50 MG tablet Take 1 tablet (50 mg total) by mouth daily.  90 tablet  3  . Multiple Vitamins-Minerals (MULTIVITAMIN PO) Take 1 tablet by mouth daily.      . pantoprazole (PROTONIX) 40 MG tablet Take 1 tablet (40 mg total) by mouth 2 (two) times daily.  60 tablet  3  . potassium chloride (K-DUR) 10 MEQ tablet Take 10 mEq by mouth daily as needed (when taking Furosemide).      . simvastatin (ZOCOR) 80 MG tablet Take 40 mg by mouth at bedtime.         [name] tiotropium (SPIRIVA) 18 MCG inhalation capsule Place 18 mcg into inhaler and inhale daily.       . vitamin C (ASCORBIC ACID) 500 MG tablet Take 500 mg by mouth 2 (two) times daily.       [name] warfarin (COUMADIN) 5 MG tablet Take 5-7.5 mg by mouth daily. 7.5mg  on Tuesday and Friday, 5mg  all other days      . albuterol (PROVENTIL) (2.5 MG/3ML) 0.083% nebulizer solution Take 2.5 mg by nebulization every 6 (six) hours as needed for wheezing or shortness of breath.       . nitroGLYCERIN (NITROSTAT) 0.4 MG SL tablet Place 0.4 mg under the tongue every 5 (five) minutes as needed for chest pain.         Assessment: 73 yo man continuing coumadin for LVAD.  His INR remains in goal range on home dose.  His Hg had trended down and received 2 units pRBCs. No complications noted, patient's INR continues to be stable at 2.6.  Plan is for enteroscopy per GI, will continue low dose coumadin tonight to keep INR at low end.  Goal of Therapy:  INR 1.8-2.5 Monitor platelets by anticoagulation  protocol: Yes   Plan:  Warfarin 2mg  tonight INR check in am  Thanks for allowing pharmacy to be a part of this patient's care.  Erin Hearing PharmD., BCPS Clinical Pharmacist Pager [phone] 07/27/2014 7:27 AM

## 2014-07-27 NOTE — Progress Notes (Addendum)
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 Quit smoking 2003. Uses CPAP and O2 every night.   He is s/p LVAD HM II implanted 01/12/13 under DT criteria.   Admitted to [facility] 09/13/13 - 09/14/13 for increased fatigue and dyspnea. Found to have mild CHF and anemia. Diuresed with IV lasix and transitioned to PO lasix 40 mg twice a week. Discharge weight 203 lbs on inpatient scale. Admitting labs revealed Hgb 7.7 for which he received 2 units PCs with appropriate rise in Hgb.   Admit 6/8-6/12/15r symptomatic anemia. Had EGD/Colonoscopy which was unrevealing. 6 mm polyp clipped and removed. CT abdomen showed cirrhosis. Capsule study showed small bowel angioectasia that may have been source of slow GI blood loss.   Has received 6u PRBCs total. Hgb back down. For push enteroscopy today. Hgb down again 9.8 -> 8.4. INR 2.6 Feels weak again.   MAPS running ~80-104  HeartMate II LVAD: Flow: 5.7    Speed: 9200      Power: 6.0   PI:   4.8  occasional PI events       Objective:    Vital Signs:   Temp:  [97.9 F (36.6 C)-98.4 F (36.9 C)] 98 F (36.7 C) (10/28 0524) Pulse Rate:  [70-84] 77 (10/28 0524) Resp:  [16-18] 18 (10/28 0524) SpO2:  [96 %-100 %] 96 % (10/28 0524) Weight:  [209 lb 14.1 oz (95.2 kg)] 209 lb 14.1 oz (95.2 kg) (10/28 0524) Last BM Date: 07/26/14 Mean arterial Pressure ~80-104  Intake/Output:   Intake/Output Summary (Last 24 hours) at 07/27/14 0744 Last data filed at 07/26/14 2115  Gross per 24 hour  Intake    913 ml  Output      0 ml  Net    913 ml     Physical Exam:   General: Elderly,  NAD  Neck: No JVD, no thyromegaly or thyroid nodule.  Lungs: Clear to auscultation bilaterally with normal respiratory effort.  CV: LVAD hum present;  Abdomen: Soft, nontender, no hepatosplenomegaly, no distention.  Driveline dressing C/D/I Skin: Intact without lesions or rashes.  Neurologic: Alert and oriented x 3.  Psych: Normal affect.  Extremities: No clubbing or cyanosis. No edema. HEENT: Normal.   Telemetry: NSR  Labs: Basic Metabolic Panel:  Recent Labs Lab 07/23/14 1410 07/24/14 0420 07/25/14 0322 07/26/14 0525 07/27/14 0455  NA 131* 136* 135* 135* 137  K 4.5 3.9 4.1 4.2 3.9  CL 99 102 102 101 102  CO2 22 24 21 22 23   GLUCOSE 141* 100* 117* 140* 111*  BUN 29* 30* 33* 31* 23  CREATININE 0.81 0.77 0.79 0.79 0.79  CALCIUM 9.4 9.1 9.3 9.0 9.3    Liver Function Tests:  Recent Labs Lab 07/23/14 1410  AST 23  ALT 12  ALKPHOS 49  BILITOT 0.5  PROT 6.3  ALBUMIN 3.3*   No results found for this basename: LIPASE, AMYLASE,  in the last 168 hours No results found for this basename: AMMONIA,  in the last 168 hours  CBC:  Recent Labs Lab 07/23/14 1410 07/24/14 0420 07/25/14 0322 07/26/14 0525 07/27/14 0455  WBC 9.3 9.7 10.4 10.1 10.4  NEUTROABS 7.5  --   --   --   --   HGB 8.2* 8.4* 9.3* 8.3* 9.8*  HCT 26.9* 26.8* 28.4* 26.1* 30.9*  MCV 108.5* 103.5*  97.9 100.4* 99.7  PLT 178 153 143* 140* 138*    INR:  Recent Labs Lab 07/23/14 1410 07/24/14 0420 07/25/14 0322 07/27/14 0455  INR 2.00* 2.15* 2.23* 2.60*    Other results:    Imaging: No results found.   Medications:     Scheduled Medications: . sodium chloride   Intravenous Once  . atorvastatin  40 mg Oral q1800  . budesonide-formoterol  2 puff Inhalation BID  . cholecalciferol  2,000 Units Oral QHS  . citalopram  40 mg Oral QHS  . docusate sodium  200 mg Oral QHS  . ferrous gluconate  324 mg Oral TID WC  . levothyroxine  50 mcg Oral QHS  . pantoprazole  40 mg Oral BID  . sodium chloride  3 mL Intravenous Q12H  . tiotropium  18 mcg Inhalation Daily  . cyanocobalamin  1,000 mcg Oral QHS  . vitamin C  500 mg Oral BID  . [START ON 07/29/2014] warfarin  7.5 mg Oral Once per day on Tue Fri   And   . warfarin  5 mg Oral Once per day on Sun Mon Wed Thu Sat  . Warfarin - Pharmacist Dosing Inpatient   Does not apply q1800    Infusions:    PRN Medications: sodium chloride, albuterol, nitroGLYCERIN, sodium chloride   Assessment/plan:   73 yo with CAD s/p CABG and ischemic cardiomyopathy s/p Heartmate II LVAD was admitted with increased exertional dyspnea. He was found to be anemic.  1. Exertional dyspnea 2. Anemia: - He will remain off ASA but will have to stay on coumadin, goal INR 1.8-2.5.  - Push enteroscopy 10/28 negative 3. Chronic systolic CHF: Ischemic CMP, has Heartmate II LVAD. Indices stable. Volume stable.  4. PAF 5. CAD s/p CABG 6. HTN  Hgb back down. For push enteroscopy today.  Start losartan back after procedures.   Length of Stay: 4  [name] 07/27/2014, 7:44 AM  VAD Team --- VAD ISSUES ONLY--- Pager [phone] (7am - 7am)  Advanced Heart Failure Team  Pager [phone] (M-F; 7a - 4p)  Please contact [facility] for night-coverage after hours (4p -7a ) and weekends on [email or web address]

## 2014-07-28 ENCOUNTER — Encounter (HOSPITAL_COMMUNITY): Payer: Self-pay | Admitting: Internal Medicine

## 2014-07-28 LAB — CBC
HCT: 27.2 % — ABNORMAL LOW (ref 39.0–52.0)
HCT: 31.8 % — ABNORMAL LOW (ref 39.0–52.0)
HEMOGLOBIN: 10 g/dL — AB (ref 13.0–17.0)
Hemoglobin: 8.4 g/dL — ABNORMAL LOW (ref 13.0–17.0)
MCH: 31.8 pg (ref 26.0–34.0)
MCH: 32.8 pg (ref 26.0–34.0)
MCHC: 30.9 g/dL (ref 30.0–36.0)
MCHC: 31.4 g/dL (ref 30.0–36.0)
MCV: 103 fL — ABNORMAL HIGH (ref 78.0–100.0)
MCV: 104.3 fL — AB (ref 78.0–100.0)
Platelets: 131 10*3/uL — ABNORMAL LOW (ref 150–400)
Platelets: 151 10*3/uL (ref 150–400)
RBC: 2.64 MIL/uL — ABNORMAL LOW (ref 4.22–5.81)
RBC: 3.05 MIL/uL — AB (ref 4.22–5.81)
RDW: 25.5 % — AB (ref 11.5–15.5)
RDW: 26.1 % — ABNORMAL HIGH (ref 11.5–15.5)
WBC: 8.5 10*3/uL (ref 4.0–10.5)
WBC: 10.4 10*3/uL (ref 4.0–10.5)

## 2014-07-28 LAB — PROTIME-INR
INR: 2.37 — ABNORMAL HIGH (ref 0.00–1.49)
Prothrombin Time: 26.1 seconds — ABNORMAL HIGH (ref 11.6–15.2)

## 2014-07-28 LAB — BASIC METABOLIC PANEL
Anion gap: 10 (ref 5–15)
BUN: 21 mg/dL (ref 6–23)
CALCIUM: 8.7 mg/dL (ref 8.4–10.5)
CO2: 24 mEq/L (ref 19–32)
CREATININE: 0.72 mg/dL (ref 0.50–1.35)
Chloride: 102 mEq/L (ref 96–112)
GFR calc Af Amer: >90 mL/min (ref >90)
GFR calc non Af Amer: >90 mL/min (ref >90)
GLUCOSE: 118 mg/dL — AB (ref 70–99)
POTASSIUM: 3.9 meq/L (ref 3.7–5.3)
SODIUM: 136 meq/L — AB (ref 137–147)

## 2014-07-28 LAB — PREPARE RBC (CROSSMATCH)

## 2014-07-28 MED ORDER — PEG-KCL-NACL-NASULF-NA ASC-C 100 G PO SOLR
0.5000 | Freq: Once | ORAL | Status: AC
  Administered 2014-07-28: 100 g via ORAL
  Filled 2014-07-28: qty 1

## 2014-07-28 MED ORDER — SODIUM CHLORIDE 0.9 % IV SOLN: INTRAVENOUS | Status: DC

## 2014-07-28 MED ORDER — SODIUM CHLORIDE 0.9 % IV SOLN: Freq: Once | INTRAVENOUS | Status: DC

## 2014-07-28 MED ORDER — FUROSEMIDE 10 MG/ML IJ SOLN
20.0000 mg | Freq: Once | INTRAMUSCULAR | Status: AC
  Administered 2014-07-28: 20 mg via INTRAVENOUS
  Filled 2014-07-28: qty 2

## 2014-07-28 MED ORDER — METOCLOPRAMIDE HCL 5 MG/ML IJ SOLN
10.0000 mg | Freq: Once | INTRAMUSCULAR | Status: AC
  Administered 2014-07-28: 10 mg via INTRAVENOUS
  Filled 2014-07-28: qty 2

## 2014-07-28 MED ORDER — WARFARIN SODIUM 2 MG PO TABS
2.0000 mg | ORAL_TABLET | Freq: Once | ORAL | Status: DC
  Filled 2014-07-28: qty 1

## 2014-07-28 MED ORDER — SODIUM CHLORIDE 0.9 % IV SOLN
INTRAVENOUS | Status: DC
  Administered 2014-07-28: 22:00:00 via INTRAVENOUS

## 2014-07-28 NOTE — Progress Notes (Signed)
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 Quit smoking 2003. Uses CPAP and O2 every night.   He is s/p LVAD HM II implanted 01/12/13 under DT criteria.   Admitted to [facility] 09/13/13 - 09/14/13 for increased fatigue and dyspnea. Found to have mild CHF and anemia. Diuresed with IV lasix and transitioned to PO lasix 40 mg twice a week. Discharge weight 203 lbs on inpatient scale. Admitting labs revealed Hgb 7.7 for which he received 2 units PCs with appropriate rise in Hgb.   Admit 6/8-6/12/15r symptomatic anemia. Had EGD/Colonoscopy which was unrevealing. 6 mm polyp clipped and removed. CT abdomen showed cirrhosis. Capsule study showed small bowel angioectasia that may have been source of slow GI blood loss.   Has received 6u PRBCs total. Underwent push enteroscopy yesterday with no source of bleeding found. Hgb down again 9.8 -> 8.4. No obvious bleeding. INR 2.4 Feels weak again.   MAPS running ~88  HeartMate II LVAD: Flow: 5.7    Speed: 9200      Power: 6.0   PI:   4.8  occasional PI events       Objective:    Vital Signs:   Temp:  [97.5 F (36.4 C)-98.3 F (36.8 C)] 98.1 F (36.7 C) (10/29 0343) Pulse Rate:  [66-84] 72 (10/29 0343) Resp:  [11-19] 18 (10/29 0343) BP: (88-92)/(0) 88/0 mmHg (10/28 1403) SpO2:  [96 %-99 %] 96 % (10/29 0343) Weight:  [96.707 kg (213 lb 3.2 oz)] 96.707 kg (213 lb 3.2 oz) (10/29 0343) Last BM Date: 07/26/14 Mean arterial Pressure ~88  Intake/Output:   Intake/Output Summary (Last 24 hours) at 07/28/14 0709 Last data filed at 07/27/14 1901  Gross per 24 hour  Intake    770 ml  Output      0 ml  Net    770 ml     Physical Exam:   General: Elderly,  NAD  Neck: No JVD, no thyromegaly or thyroid nodule.  Lungs: Clear to auscultation bilaterally with normal respiratory effort.  CV: LVAD  hum present;  Abdomen: Soft, nontender, no hepatosplenomegaly, no distention. Driveline dressing C/D/I Skin: Intact without lesions or rashes.  Neurologic: Alert and oriented x 3.  Psych: Normal affect.  Extremities: No clubbing or cyanosis. No edema. HEENT: Normal.   Telemetry: NSR  Labs: Basic Metabolic Panel:  Recent Labs Lab 07/24/14 0420 07/25/14 0322 07/26/14 0525 07/27/14 0455 07/28/14 0331  NA 136* 135* 135* 137 136*  K 3.9 4.1 4.2 3.9 3.9  CL 102 102 101 102 102  CO2 24 21 22 23 24   GLUCOSE 100* 117* 140* 111* 118*  BUN 30* 33* 31* 23 21  CREATININE 0.77 0.79 0.79 0.79 0.72  CALCIUM 9.1 9.3 9.0 9.3 8.7    Liver Function Tests:  Recent Labs Lab 07/23/14 1410  AST 23  ALT 12  ALKPHOS 49  BILITOT 0.5  PROT 6.3  ALBUMIN 3.3*   No results found for this basename: LIPASE, AMYLASE,  in the last 168 hours No results found for this basename: AMMONIA,  in the last 168 hours  CBC:  Recent Labs Lab 07/23/14 1410 07/24/14 0420 07/25/14 0322 07/26/14 0525 07/27/14 0455 07/28/14 0331  WBC 9.3 9.7 10.4 10.1 10.4 8.5  NEUTROABS 7.5  --   --   --   --   --  HGB 8.2* 8.4* 9.3* 8.3* 9.8* 8.4*  HCT 26.9* 26.8* 28.4* 26.1* 30.9* 27.2*  MCV 108.5* 103.5* 97.9 100.4* 99.7 103.0*  PLT 178 153 143* 140* 138* 131*    INR:  Recent Labs Lab 07/23/14 1410 07/24/14 0420 07/25/14 0322 07/27/14 0455 07/28/14 0331  INR 2.00* 2.15* 2.23* 2.60* 2.37*    Other results:    Imaging: No results found.   Medications:     Scheduled Medications: . sodium chloride   Intravenous Once  . sodium chloride   Intravenous Once  . atorvastatin  40 mg Oral q1800  . budesonide-formoterol  2 puff Inhalation BID  . cholecalciferol  2,000 Units Oral QHS  . citalopram  40 mg Oral QHS  . docusate sodium  200 mg Oral QHS  . ferrous gluconate  324 mg Oral TID WC  . furosemide  20 mg Intravenous Once  . levothyroxine  50 mcg Oral QHS  . losartan  25 mg Oral Daily  .  pantoprazole  40 mg Oral BID  . sodium chloride  3 mL Intravenous Q12H  . tiotropium  18 mcg Inhalation Daily  . cyanocobalamin  1,000 mcg Oral QHS  . vitamin C  500 mg Oral BID  . Warfarin - Pharmacist Dosing Inpatient   Does not apply q1800    Infusions:    PRN Medications: sodium chloride, albuterol, fentaNYL, nitroGLYCERIN, sodium chloride   Assessment/plan:   73 yo with CAD s/p CABG and ischemic cardiomyopathy s/p Heartmate II LVAD was admitted with increased exertional dyspnea. He was found to be anemic.  1. Exertional dyspnea 2. Anemia: - He will remain off ASA but will have to stay on coumadin, goal INR 1.8-2.5.  - Push enteroscopy 10/28 negative 3. Chronic systolic CHF: Ischemic CMP, has Heartmate II LVAD. Indices stable. Volume stable.  4. PAF 5. CAD s/p CABG 6. HTN  Hgb back down today. Push enteroscopy negative. Will discuss need for further work-up with GI. Will transfuse one more unit today.  Length of Stay: 5  Glori Bickers MD 07/28/2014, 7:09 AM  VAD Team --- VAD ISSUES ONLY--- Pager [phone] (7am - 7am)  Advanced Heart Failure Team  Pager [phone] (M-F; 7a - 4p)  Please contact [facility] for night-coverage after hours (4p -7a ) and weekends on [email or web address]

## 2014-07-28 NOTE — Progress Notes (Addendum)
ANTICOAGULATION CONSULT NOTE - Follow Up Consult  Pharmacy Consult for coumadin Indication: LVAD  No Known Allergies  Patient Measurements: Height: 6' (182.9 cm) Weight: 213 lb 3.2 oz (96.707 kg) (with LVAD system controller) IBW/kg (Calculated) : 77.6   Vital Signs: Temp: 98.1 F (36.7 C) (10/29 1118) Temp Source: Oral (10/29 1118) BP: 80/0 mmHg (10/29 1118) Pulse Rate: 64 (10/29 1118)  Labs:  Recent Labs  07/26/14 0525 07/27/14 0455 07/28/14 0331  HGB 8.3* 9.8* 8.4*  HCT 26.1* 30.9* 27.2*  PLT 140* 138* 131*  LABPROT  --  28.0* 26.1*  INR  --  2.60* 2.37*  CREATININE 0.79 0.79 0.72    Estimated Creatinine Clearance: 100.6 ml/min (by C-G formula based on Cr of 0.72).   Medications:  Prescriptions prior to admission  Medication Sig Dispense Refill  . budesonide-formoterol (SYMBICORT) 160-4.5 MCG/ACT inhaler Inhale 2 puffs into the lungs 2 (two) times daily.      . cholecalciferol (VITAMIN D) 1000 UNITS tablet Take 2,000 Units by mouth at bedtime.       . citalopram (CELEXA) 40 MG tablet Take 40 mg by mouth at bedtime.      . cyanocobalamin 1000 MCG tablet Take 1,000 mcg by mouth at bedtime.       . docusate sodium (COLACE) 100 MG capsule Take 200 mg by mouth at bedtime.       . ferrous gluconate (FERGON) 324 MG tablet Take 324 mg by mouth 3 (three) times daily with meals.      . furosemide (LASIX) 40 MG tablet Take 40 mg by mouth daily as needed (fluid retention).      [name] levothyroxine (SYNTHROID, LEVOTHROID) 100 MCG tablet Take 50 mcg by mouth at bedtime.       [name] losartan (COZAAR) 50 MG tablet Take 1 tablet (50 mg total) by mouth daily.  90 tablet  3  . Multiple Vitamins-Minerals (MULTIVITAMIN PO) Take 1 tablet by mouth daily.      . pantoprazole (PROTONIX) 40 MG tablet Take 1 tablet (40 mg total) by mouth 2 (two) times daily.  60 tablet  3  . potassium chloride (K-DUR) 10 MEQ tablet Take 10 mEq by mouth daily as needed (when taking Furosemide).      . simvastatin  (ZOCOR) 80 MG tablet Take 40 mg by mouth at bedtime.        [name] tiotropium (SPIRIVA) 18 MCG inhalation capsule Place 18 mcg into inhaler and inhale daily.       . vitamin C (ASCORBIC ACID) 500 MG tablet Take 500 mg by mouth 2 (two) times daily.       [name] warfarin (COUMADIN) 5 MG tablet Take 5-7.5 mg by mouth daily. 7.5mg  on Tuesday and Friday, 5mg  all other days      . albuterol (PROVENTIL) (2.5 MG/3ML) 0.083% nebulizer solution Take 2.5 mg by nebulization every 6 (six) hours as needed for wheezing or shortness of breath.       . nitroGLYCERIN (NITROSTAT) 0.4 MG SL tablet Place 0.4 mg under the tongue every 5 (five) minutes as needed for chest pain.         Assessment: 73 yo man continuing coumadin for LVAD.  His INR remains in goal range on home dose.  His Hg had trended down and received 2 units pRBCs, currently 8.4. No complications noted, patient's INR continues to be stable at 2.3.  Plan to keep INR on low end  Goal of Therapy:  INR 1.8-2.5 Monitor platelets by anticoagulation  protocol: Yes   Plan:  Warfarin 2mg  tonight INR check in am  Thanks for allowing pharmacy to be a part of this patient's care.  Erin Hearing PharmD., BCPS Clinical Pharmacist Pager [phone] 07/28/2014 11:25 AM  Addn: Holding warfarin dose tonight due to upcoming colonoscopy. Will resume warfarin tomorrow.  Andrey Cota. Diona Foley, PharmD Clinical Pharmacist Pager [phone]

## 2014-07-28 NOTE — Progress Notes (Signed)
          Daily Rounding Note  07/28/2014, 12:10 PM  LOS: 5 days   SUBJECTIVE:       Hgb dropped 1.5 grams in last 24 hours.  No blood or melenic stools.  No abdominal pain.  Pt feels well.   OBJECTIVE:         Vital signs in last 24 hours:    Temp:  [97.5 F (36.4 C)-98.3 F (36.8 C)] 98.1 F (36.7 C) (10/29 1118) Pulse Rate:  [64-84] 64 (10/29 1118) Resp:  [11-19] 18 (10/29 1118) BP: (80-92)/(0) 80/0 mmHg (10/29 1118) SpO2:  [96 %-99 %] 99 % (10/29 1118) Weight:  [213 lb 3.2 oz (96.707 kg)] 213 lb 3.2 oz (96.707 kg) (10/29 0343) Last BM Date: 07/26/14 Filed Weights   07/26/14 0430 07/27/14 0524 07/28/14 0343  Weight: 210 lb 12.2 oz (95.6 kg) 209 lb 14.1 oz (95.2 kg) 213 lb 3.2 oz (96.707 kg)   General: looks comfortable. And not acutely ill   Heart: soft regular pulse underlying hum of LVAD Chest: clear bil.  No dyspnea Abdomen: soft, NT, drive line site benign appearing  Extremities: no CCE.  No large hematomas on trunk or limbs.  Only minor purpura on UE Neuro/Psych:  Pleasant, relaxed, oriented x 3.  No obvious deficits.    Lab Results:  Recent Labs  07/26/14 0525 07/27/14 0455 07/28/14 0331  WBC 10.1 10.4 8.5  HGB 8.3* 9.8* 8.4*  HCT 26.1* 30.9* 27.2*  PLT 140* 138* 131*   BMET  Recent Labs  07/26/14 0525 07/27/14 0455 07/28/14 0331  NA 135* 137 136*  K 4.2 3.9 3.9  CL 101 102 102  CO2 22 23 24   GLUCOSE 140* 111* 118*  BUN 31* 23 21  CREATININE 0.79 0.79 0.72  CALCIUM 9.0 9.3 8.7   LFT No results found for this basename: PROT, ALBUMIN, AST, ALT, ALKPHOS, BILITOT, BILIDIR, IBILI,  in the last 72 hours PT/INR  Recent Labs  07/27/14 0455 07/28/14 0331  LABPROT 28.0* 26.1*  INR 2.60* 2.37*     ASSESMENT:   * Recurrent transfusion requiring anemia in pt with 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 Treatment complicated by his s/p LVAD status and need for life-long  Coumadin. Another EGD on 10/30: irregular zline, small HH.      PLAN   *  Per Dr Carlean Purl.  ? Repeat colonoscopy.  CBC in AM.  Transfusing 1 PRBC today/now.     Azucena Freed  07/28/2014, 12:10 PM Pager: [phone]  [facility] Attending  I have also seen and assessed the patient and agree with the above note. He says stools are black "on iron"  Last colonoscopy had poor prep so should be repeated. Will do that tomorrow.  The risks and benefits as well as alternatives of endoscopic procedure(s) have been discussed and reviewed. All questions answered. The patient agrees to proceed.   Gatha Mayer, MD, [facility] [phone] (pager) 07/28/2014 4:12 PM

## 2014-07-28 NOTE — Progress Notes (Signed)
1430 Offered to walk with pt. Pt asks that we not walk at this time since he is being kept busy with lasix. Stated he will walk tomorrow. Will continue to follow. Graylon Good RN BSN 07/28/2014 2:28 PM

## 2014-07-29 ENCOUNTER — Inpatient Hospital Stay (HOSPITAL_COMMUNITY): Payer: Non-veteran care | Admitting: Anesthesiology

## 2014-07-29 ENCOUNTER — Inpatient Hospital Stay (HOSPITAL_COMMUNITY): Payer: Non-veteran care

## 2014-07-29 ENCOUNTER — Encounter (HOSPITAL_COMMUNITY)
Admission: EM | Disposition: A | Discharge status: Home / Self Care | Payer: Medicare Other | Source: Home / Self Care | Attending: Cardiology

## 2014-07-29 ENCOUNTER — Encounter (HOSPITAL_COMMUNITY): Payer: Non-veteran care | Admitting: Anesthesiology

## 2014-07-29 HISTORY — PX: COLONOSCOPY: SHX5424

## 2014-07-29 LAB — CBC
HCT: 31.9 % — ABNORMAL LOW (ref 39.0–52.0)
HEMOGLOBIN: 9.9 g/dL — AB (ref 13.0–17.0)
MCH: 32 pg (ref 26.0–34.0)
MCHC: 31 g/dL (ref 30.0–36.0)
MCV: 103.2 fL — ABNORMAL HIGH (ref 78.0–100.0)
Platelets: 148 10*3/uL — ABNORMAL LOW (ref 150–400)
RBC: 3.09 MIL/uL — ABNORMAL LOW (ref 4.22–5.81)
RDW: 26.7 % — ABNORMAL HIGH (ref 11.5–15.5)
WBC: 10.2 10*3/uL (ref 4.0–10.5)

## 2014-07-29 LAB — BASIC METABOLIC PANEL
ANION GAP: 11 (ref 5–15)
BUN: 20 mg/dL (ref 6–23)
CHLORIDE: 105 meq/L (ref 96–112)
CO2: 24 meq/L (ref 19–32)
CREATININE: 0.82 mg/dL (ref 0.50–1.35)
Calcium: 9.3 mg/dL (ref 8.4–10.5)
GFR calc Af Amer: >90 mL/min (ref >90)
GFR calc non Af Amer: 86 mL/min — ABNORMAL LOW (ref >90)
GLUCOSE: 111 mg/dL — AB (ref 70–99)
Potassium: 3.7 mEq/L (ref 3.7–5.3)
Sodium: 140 mEq/L (ref 137–147)

## 2014-07-29 LAB — TYPE AND SCREEN
ABO/RH(D): O POS
ANTIBODY SCREEN: NEGATIVE
Unit division: 0

## 2014-07-29 LAB — PROTIME-INR
INR: 1.77 — ABNORMAL HIGH (ref 0.00–1.49)
Prothrombin Time: 20.8 seconds — ABNORMAL HIGH (ref 11.6–15.2)

## 2014-07-29 SURGERY — COLONOSCOPY
Anesthesia: Monitor Anesthesia Care

## 2014-07-29 MED ORDER — WARFARIN SODIUM 3 MG PO TABS
3.0000 mg | ORAL_TABLET | Freq: Once | ORAL | Status: AC
  Administered 2014-07-29: 3 mg via ORAL
  Filled 2014-07-29: qty 1

## 2014-07-29 MED ORDER — MEPERIDINE HCL 25 MG/ML IJ SOLN: 6.2500 mg | INTRAMUSCULAR | Status: DC | PRN

## 2014-07-29 MED ORDER — SODIUM CHLORIDE 0.9 % IV SOLN: INTRAVENOUS | Status: DC

## 2014-07-29 MED ORDER — PROMETHAZINE HCL 25 MG/ML IJ SOLN: 6.2500 mg | INTRAMUSCULAR | Status: DC | PRN

## 2014-07-29 MED ORDER — LACTATED RINGERS IV SOLN
INTRAVENOUS | Status: DC
  Administered 2014-07-29: 10:00:00 via INTRAVENOUS

## 2014-07-29 MED ORDER — PHENYLEPHRINE HCL 10 MG/ML IJ SOLN
INTRAMUSCULAR | Status: DC | PRN
  Administered 2014-07-29 (×3): 80 ug via INTRAVENOUS

## 2014-07-29 MED ORDER — FENTANYL CITRATE 0.05 MG/ML IJ SOLN: 25.0000 ug | INTRAMUSCULAR | Status: DC | PRN

## 2014-07-29 MED ORDER — LACTATED RINGERS IV SOLN
INTRAVENOUS | Status: DC | PRN
  Administered 2014-07-29: 10:00:00 via INTRAVENOUS

## 2014-07-29 MED ORDER — PROPOFOL INFUSION 10 MG/ML OPTIME
INTRAVENOUS | Status: DC | PRN
  Administered 2014-07-29: 100 ug/kg/min via INTRAVENOUS

## 2014-07-29 NOTE — Progress Notes (Signed)
ANTICOAGULATION CONSULT NOTE - Follow Up Consult  Pharmacy Consult for coumadin Indication: LVAD  No Known Allergies  Patient Measurements: Height: 6' (182.9 cm) Weight: 212 lb (96.163 kg) IBW/kg (Calculated) : 77.6   Vital Signs: Temp: 98 F (36.7 C) (10/30 0449) Temp Source: Oral (10/30 0449) Pulse Rate: 64 (10/30 0449)  Labs:  Recent Labs  07/27/14 0455 07/28/14 0331 07/28/14 1925 07/29/14 0232  HGB 9.8* 8.4* 10.0* 9.9*  HCT 30.9* 27.2* 31.8* 31.9*  PLT 138* 131* 151 148*  LABPROT 28.0* 26.1*  --  20.8*  INR 2.60* 2.37*  --  1.77*  CREATININE 0.79 0.72  --  0.82    Estimated Creatinine Clearance: 97.9 ml/min (by C-G formula based on Cr of 0.82).   Medications:  Prescriptions prior to admission  Medication Sig Dispense Refill  . budesonide-formoterol (SYMBICORT) 160-4.5 MCG/ACT inhaler Inhale 2 puffs into the lungs 2 (two) times daily.      . cholecalciferol (VITAMIN D) 1000 UNITS tablet Take 2,000 Units by mouth at bedtime.       . citalopram (CELEXA) 40 MG tablet Take 40 mg by mouth at bedtime.      . cyanocobalamin 1000 MCG tablet Take 1,000 mcg by mouth at bedtime.       . docusate sodium (COLACE) 100 MG capsule Take 200 mg by mouth at bedtime.       . ferrous gluconate (FERGON) 324 MG tablet Take 324 mg by mouth 3 (three) times daily with meals.      . furosemide (LASIX) 40 MG tablet Take 40 mg by mouth daily as needed (fluid retention).      [name] levothyroxine (SYNTHROID, LEVOTHROID) 100 MCG tablet Take 50 mcg by mouth at bedtime.       [name] losartan (COZAAR) 50 MG tablet Take 1 tablet (50 mg total) by mouth daily.  90 tablet  3  . Multiple Vitamins-Minerals (MULTIVITAMIN PO) Take 1 tablet by mouth daily.      . pantoprazole (PROTONIX) 40 MG tablet Take 1 tablet (40 mg total) by mouth 2 (two) times daily.  60 tablet  3  . potassium chloride (K-DUR) 10 MEQ tablet Take 10 mEq by mouth daily as needed (when taking Furosemide).      . simvastatin (ZOCOR) 80 MG  tablet Take 40 mg by mouth at bedtime.        [name] tiotropium (SPIRIVA) 18 MCG inhalation capsule Place 18 mcg into inhaler and inhale daily.       . vitamin C (ASCORBIC ACID) 500 MG tablet Take 500 mg by mouth 2 (two) times daily.       [name] warfarin (COUMADIN) 5 MG tablet Take 5-7.5 mg by mouth daily. 7.5mg  on Tuesday and Friday, 5mg  all other days      . albuterol (PROVENTIL) (2.5 MG/3ML) 0.083% nebulizer solution Take 2.5 mg by nebulization every 6 (six) hours as needed for wheezing or shortness of breath.       . nitroGLYCERIN (NITROSTAT) 0.4 MG SL tablet Place 0.4 mg under the tongue every 5 (five) minutes as needed for chest pain.         Assessment: 73 yo man continuing coumadin for LVAD.  His INR remains just below goal range on home dose.  His Hg had trended down and received 2 units pRBCs, currently 9.9. Black stool still noted.  Colonoscopy negative.   INR down this am to 1.77 after holding dose last night.  Plan to keep INR on low end. No heparin  bridge per HF team.  Goal of Therapy:  INR 1.8-2.5 Monitor platelets by anticoagulation protocol: Yes   Plan:  Warfarin 3mg  tonight INR check in am Follow up after colonoscopy  Thanks for allowing pharmacy to be a part of this patient's care.  Erin Hearing PharmD., BCPS Clinical Pharmacist Pager [phone] 07/29/2014 9:24 AM

## 2014-07-29 NOTE — Progress Notes (Signed)
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 Quit smoking 2003. Uses CPAP and O2 every night.   He is s/p LVAD HM II implanted 01/12/13 under DT criteria.   Admitted to [facility] 09/13/13 - 09/14/13 for increased fatigue and dyspnea. Found to have mild CHF and anemia. Diuresed with IV lasix and transitioned to PO lasix 40 mg twice a week. Discharge weight 203 lbs on inpatient scale. Admitting labs revealed Hgb 7.7 for which he received 2 units PCs with appropriate rise in Hgb.   Admit 6/8-6/12/15r symptomatic anemia. Had EGD/Colonoscopy which was unrevealing. 6 mm polyp clipped and removed. CT abdomen showed cirrhosis. Capsule study showed small bowel angioectasia that may have been source of slow GI blood loss.   Has received 7u PRBCs total. Underwent push enteroscopy 10/28 with no source of bleeding found, however Hgb continued to trend down. GI saw again yesterday and he is underwent colonoscopy today - no source of bleeding. Having some bleeding hemorrhoids. Hgb up to 9.9. Denies SOB, orthopnea or CP. Wants to go home.   HeartMate II LVAD: Flow: 5.7    Speed: 9200      Power: 6.0   PI:   4.8  occasional PI events       Objective:    Vital Signs:   Temp:  [98 F (36.7 C)-98.3 F (36.8 C)] 98 F (36.7 C) (10/30 0449) Pulse Rate:  [64-74] 74 (10/30 0920) Resp:  [16-18] 16 (10/30 0920) BP: (66-106)/(0) 66/0 mmHg (10/30 1028) SpO2:  [93 %-99 %] 93 % (10/30 1004) Weight:  [212 lb (96.163 kg)] 212 lb (96.163 kg) (10/30 0449) Last BM Date: 07/29/14 Mean arterial Pressure ~88  Intake/Output:   Intake/Output Summary (Last 24 hours) at 07/29/14 1029 Last data filed at 07/29/14 0829  Gross per 24 hour  Intake 1196.67 ml  Output   2800 ml  Net -1603.33 ml     Physical Exam:   General: Elderly,  NAD  Neck: No JVD, no thyromegaly  or thyroid nodule.  Lungs: Clear to auscultation bilaterally with normal respiratory effort.  CV: LVAD hum present;  Abdomen: Soft, nontender, no hepatosplenomegaly, no distention. Driveline dressing C/D/I Skin: Intact without lesions or rashes.  Neurologic: Alert and oriented x 3.  Psych: Normal affect.  Extremities: No clubbing or cyanosis. No edema. HEENT: Normal.   Telemetry: NSR  Labs: Basic Metabolic Panel:  Recent Labs Lab 07/25/14 0322 07/26/14 0525 07/27/14 0455 07/28/14 0331 07/29/14 0232  NA 135* 135* 137 136* 140  K 4.1 4.2 3.9 3.9 3.7  CL 102 101 102 102 105  CO2 21 22 23 24 24   GLUCOSE 117* 140* 111* 118* 111*  BUN 33* 31* 23 21 20   CREATININE 0.79 0.79 0.79 0.72 0.82  CALCIUM 9.3 9.0 9.3 8.7 9.3    Liver Function Tests:  Recent Labs Lab 07/23/14 1410  AST 23  ALT 12  ALKPHOS 49  BILITOT 0.5  PROT 6.3  ALBUMIN 3.3*   No results found for this basename: LIPASE, AMYLASE,  in the last 168 hours No results found for this basename: AMMONIA,  in the last 168 hours  CBC:  Recent Labs Lab 07/23/14 1410  07/26/14 0525 07/27/14 0455 07/28/14 0331 07/28/14 1925 07/29/14 0232  WBC 9.3  < > 10.1 10.4 8.5 10.4 10.2  NEUTROABS 7.5  --   --   --   --   --   --  HGB 8.2*  < > 8.3* 9.8* 8.4* 10.0* 9.9*  HCT 26.9*  < > 26.1* 30.9* 27.2* 31.8* 31.9*  MCV 108.5*  < > 100.4* 99.7 103.0* 104.3* 103.2*  PLT 178  < > 140* 138* 131* 151 148*  < > = values in this interval not displayed.  INR:  Recent Labs Lab 07/24/14 0420 07/25/14 0322 07/27/14 0455 07/28/14 0331 07/29/14 0232  INR 2.15* 2.23* 2.60* 2.37* 1.77*    Other results:    Imaging: No results found.   Medications:     Scheduled Medications: . sodium chloride   Intravenous Once  . sodium chloride   Intravenous Once  . atorvastatin  40 mg Oral q1800  . budesonide-formoterol  2 puff Inhalation BID  . cholecalciferol  2,000 Units Oral QHS  . citalopram  40 mg Oral QHS  .  docusate sodium  200 mg Oral QHS  . ferrous gluconate  324 mg Oral TID WC  . levothyroxine  50 mcg Oral QHS  . losartan  25 mg Oral Daily  . pantoprazole  40 mg Oral BID  . sodium chloride  3 mL Intravenous Q12H  . tiotropium  18 mcg Inhalation Daily  . cyanocobalamin  1,000 mcg Oral QHS  . vitamin C  500 mg Oral BID  . Warfarin - Pharmacist Dosing Inpatient   Does not apply q1800    Infusions: . sodium chloride 20 mL/hr at 07/28/14 2209  . sodium chloride    . lactated ringers 10 mL/hr at 07/29/14 0943    PRN Medications: sodium chloride, albuterol, fentaNYL, nitroGLYCERIN, sodium chloride   Assessment/plan:   73 yo with CAD s/p CABG and ischemic cardiomyopathy s/p Heartmate II LVAD was admitted with increased exertional dyspnea. He was found to be anemic.  1. Exertional dyspnea 2. Anemia: - He will remain off ASA but will have to stay on coumadin, goal INR 1.8-2.5.  - Push enteroscopy 10/28 negative - Underwent colonoscopy today without source of bleeding. 3. Chronic systolic CHF: Ischemic CMP, has Heartmate II LVAD.  - Volume status stable and VAD parameters at baseline.  - MAPs stable.  4. PAF 5. CAD s/p CABG 6. HTN  Length of Stay: 6  Rande Brunt MD 07/29/2014, 10:29 AM  VAD Team --- VAD ISSUES ONLY--- Pager [phone] (7am - 7am)  Advanced Heart Failure Team  Pager [phone] (M-F; 7a - 4p)  Please contact [facility] for night-coverage after hours (4p -7a ) and weekends on [email or web address]  Patient seen and examined with Junie Bame, NP. We discussed all aspects of the encounter. I agree with the assessment and plan as stated above.   Push enteroscopy and colonoscopy without source of bleeding. 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 Will watch overnight. If hgb stable can go home in am. Keep INR 1.8-2.0. No asa. If hgb dropping will likely need repeat capsule. Will hold oral iron per GI to avoid small bowel staining which would limit utility of capsule. VAD parameters look  good.  Balinda Heacock,MD 1:52 PM

## 2014-07-29 NOTE — Brief Op Note (Signed)
07/23/2014 - 07/29/2014  11:26 AM  PATIENT:  [name]  73 y.o. male  PRE-OPERATIVE DIAGNOSIS:  chronic blood loss anemia  POST-OPERATIVE DIAGNOSIS:  small polyps noted, hemmroids present, no AVMs or bleeding sites present  PROCEDURE:  Procedure(s): COLONOSCOPY (N/A)  SURGEON:  Surgeon(s) and Role:    Gatha Mayer, MD - Primary   ANESTHESIA:   MAC  EBL:  Total I/O In: 506.7 [I.V.:506.7] Out: 650 [Urine:650]   2 diminutive right colon polyps Internal hemorrhoids Fair prep - iron staining   Scope in 10:27 Cecum 10:41 out 11:07   Stop oral iron Clears Review old capsule report Reassess tomorrow D/w Dr. Haroldine Laws

## 2014-07-29 NOTE — Transfer of Care (Signed)
Immediate Anesthesia Transfer of Care Note  Patient: [name]  Procedure(s) Performed: Procedure(s): COLONOSCOPY (N/A)  Patient Location: Endoscopy Unit  Anesthesia Type:MAC  Level of Consciousness: awake, alert  and oriented  Airway & Oxygen Therapy: Patient Spontanous Breathing  Post-op Assessment: Report given to PACU RN and Post -op Vital signs reviewed and stable  Post vital signs: Reviewed and stable  Complications: No apparent anesthesia complications

## 2014-07-29 NOTE — Progress Notes (Signed)
Enema given at this time per MD order; pt has been NPO since MN; will cont. To monitor.

## 2014-07-29 NOTE — Progress Notes (Signed)
Pt with black, liquid stool after enema; pt with bleeding hemorrhoids at this time; endo made aware; will cont. To monitor.

## 2014-07-29 NOTE — Progress Notes (Signed)
VAD coordinator paged to endo suite - pt having colonoscopy today. Anesthesia present during case.      MAP  HR Flow PI Power  Speed Pre procedure: 9:30   102  77 5.4 5.4 5.6  9200 10:15   104  69 4.9 6.0 5.4  9200  Intra procedure: 10:30   66  74 4.2 5.3 5.2  9200 10:35   50  78 4.1 6.4 5.1  9200 10:45   87  70 4.9 5.5 5.5  9200 10:50   78  79 4.9 4.7 5.5  9200 10:55   86  61 4.4 4.9 5.2  9200 11:05   80  61 4.6 5.0 5.3  9200  Post procedure: 11:15   86  65 4.5 5.1 5.2  9200  2W nurse to endo recovery to accompany patient back to floor. Pt awake, alert; MD updated patient and wife regarding results of colonoscopy.

## 2014-07-29 NOTE — Anesthesia Preprocedure Evaluation (Addendum)
Anesthesia Evaluation   Patient awake    Reviewed: Allergy & Precautions, H&P , NPO status , Patient's Chart, lab work & pertinent test results  Airway        Dental   Pulmonary sleep apnea, Continuous Positive Airway Pressure Ventilation and Oxygen sleep apnea , COPD COPD inhaler, former smoker,  90 pack year HX, quit 2003, COPD, OSA breath sounds clear to auscultation        Cardiovascular hypertension, Pt. on medications + CAD and + Past MI + pacemaker (ICD) + Cardiac Defibrillator Rhythm:Regular Rate:Normal  ICD, LVAD 2014, ECHO 2014 EF 35%, SP ACB 2003, Ischemic cardiomyopathy, continued anemia   Neuro/Psych Depression    GI/Hepatic   Endo/Other  Hypothyroidism   Renal/GU      Musculoskeletal   Abdominal   Peds  Hematology  (+) anemia , H/H 8/27   Anesthesia Other Findings   Reproductive/Obstetrics                            Anesthesia Physical Anesthesia Plan  ASA: IV  Anesthesia Plan: MAC   Post-op Pain Management:    Induction: Intravenous  Airway Management Planned: Nasal Cannula  Additional Equipment:   Intra-op Plan:   Post-operative Plan:   Informed Consent: I have reviewed the patients History and Physical, chart, labs and discussed the procedure including the risks, benefits and alternatives for the proposed anesthesia with the patient or authorized representative who has indicated his/her understanding and acceptance.     Plan Discussed with:   Anesthesia Plan Comments: (High risk, ASA IV, ICD, LVAD, COPD, Anemia, SP ACB EF about30%.)        Anesthesia Quick Evaluation

## 2014-07-29 NOTE — Anesthesia Postprocedure Evaluation (Signed)
  Anesthesia Post-op Note  Patient: [name]  Procedure(s) Performed: Procedure(s): COLONOSCOPY (N/A)  Patient Location: Endoscopy Unit  Anesthesia Type:MAC  Level of Consciousness: awake and alert   Airway and Oxygen Therapy: Patient Spontanous Breathing  Post-op Pain: none  Post-op Assessment: Post-op Vital signs reviewed and Patient's Cardiovascular Status Stable  Post-op Vital Signs: stable  Last Vitals:  Filed Vitals:   07/29/14 1150  BP:   Pulse: 68  Temp:   Resp:     Complications: No apparent anesthesia complications

## 2014-07-30 ENCOUNTER — Encounter (HOSPITAL_COMMUNITY)
Admission: EM | Disposition: A | Discharge status: Home / Self Care | Payer: Medicare Other | Source: Home / Self Care | Attending: Cardiology

## 2014-07-30 HISTORY — PX: GIVENS CAPSULE STUDY: SHX5432

## 2014-07-30 LAB — CBC
HCT: 28.3 % — ABNORMAL LOW (ref 39.0–52.0)
HCT: 30.5 % — ABNORMAL LOW (ref 39.0–52.0)
Hemoglobin: 8.8 g/dL — ABNORMAL LOW (ref 13.0–17.0)
Hemoglobin: 9.5 g/dL — ABNORMAL LOW (ref 13.0–17.0)
MCH: 33.1 pg (ref 26.0–34.0)
MCH: 32.9 pg (ref 26.0–34.0)
MCHC: 31.1 g/dL (ref 30.0–36.0)
MCHC: 31.1 g/dL (ref 30.0–36.0)
MCV: 106.4 fL — AB (ref 78.0–100.0)
MCV: 105.5 fL — ABNORMAL HIGH (ref 78.0–100.0)
PLATELETS: 142 10*3/uL — AB (ref 150–400)
Platelets: 150 10*3/uL (ref 150–400)
RBC: 2.66 MIL/uL — ABNORMAL LOW (ref 4.22–5.81)
RBC: 2.89 MIL/uL — ABNORMAL LOW (ref 4.22–5.81)
RDW: 26.5 % — AB (ref 11.5–15.5)
RDW: 26 % — AB (ref 11.5–15.5)
WBC: 8.3 10*3/uL (ref 4.0–10.5)
WBC: 9.4 10*3/uL (ref 4.0–10.5)

## 2014-07-30 LAB — PROTIME-INR
INR: 1.52 — ABNORMAL HIGH (ref 0.00–1.49)
Prothrombin Time: 18.5 seconds — ABNORMAL HIGH (ref 11.6–15.2)

## 2014-07-30 SURGERY — IMAGING PROCEDURE, GI TRACT, INTRALUMINAL, VIA CAPSULE
Anesthesia: LOCAL

## 2014-07-30 MED ORDER — METOCLOPRAMIDE HCL 5 MG/ML IJ SOLN
10.0000 mg | Freq: Once | INTRAMUSCULAR | Status: AC
  Administered 2014-07-30: 10 mg via INTRAVENOUS
  Filled 2014-07-30 (×2): qty 2

## 2014-07-30 MED ORDER — WARFARIN SODIUM 5 MG PO TABS
5.0000 mg | ORAL_TABLET | Freq: Once | ORAL | Status: AC
  Administered 2014-07-30: 5 mg via ORAL
  Filled 2014-07-30: qty 1

## 2014-07-30 SURGICAL SUPPLY — 1 items: TOWEL COTTON PACK 4EA (MISCELLANEOUS) ×4 IMPLANT

## 2014-07-30 NOTE — Progress Notes (Addendum)
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 Quit smoking 2003. Uses CPAP and O2 every night.   He is s/p LVAD HM II implanted 01/12/13 under DT criteria.   Admitted to [facility] 09/13/13 - 09/14/13 for increased fatigue and dyspnea. Found to have mild CHF and anemia. Diuresed with IV lasix and transitioned to PO lasix 40 mg twice a week. Discharge weight 203 lbs on inpatient scale. Admitting labs revealed Hgb 7.7 for which he received 2 units PCs with appropriate rise in Hgb.   Admit 6/8-6/12/15 symptomatic anemia. Had EGD/Colonoscopy which was unrevealing. 6 mm polyp clipped and removed. CT abdomen showed cirrhosis. Capsule study showed ? small bowel angioectasia that may have been source of slow GI blood loss.   Has received 7u PRBCs total.  Underwent push enteroscopy 10/28 with no source of bleeding found, however Hgb continued to trend down.  Underwent colonoscopy 10/29 - no source of bleeding.   Hgb back down to 8.8. Denies SOB, orthopnea or CP. Bloody stools. MAP 78-80  HeartMate II LVAD: Flow: 5.6    Speed: 9200      Power: 6.0   PI:   5.3  no PI events       Objective:    Vital Signs:   Temp:  [97.5 F (36.4 C)-97.9 F (36.6 C)] 97.5 F (36.4 C) (10/31 0453) Pulse Rate:  [64-74] 72 (10/31 0803) Resp:  [16-18] 18 (10/31 0803) BP: (66-106)/(0) 86/0 mmHg (10/30 1116) SpO2:  [93 %-96 %] 96 % (10/30 1947) FiO2 (%):  [28 %] 28 % (10/30 1206) Weight:  [94.4 kg (208 lb 1.8 oz)] 94.4 kg (208 lb 1.8 oz) (10/31 0453) Last BM Date: 07/29/14 Mean arterial Pressure ~78-80  Intake/Output:   Intake/Output Summary (Last 24 hours) at 07/30/14 0824 Last data filed at 07/29/14 1738  Gross per 24 hour  Intake 746.67 ml  Output    900 ml  Net -153.33 ml     Physical Exam:   General: Elderly,  NAD  Neck: No JVD, no thyromegaly  or thyroid nodule.  Lungs: Clear to auscultation bilaterally with normal respiratory effort.  CV: LVAD hum present;  Abdomen: Soft, nontender, no hepatosplenomegaly, no distention. Driveline dressing C/D/I Skin: Intact without lesions or rashes.  Neurologic: Alert and oriented x 3.  Psych: Normal affect.  Extremities: No clubbing or cyanosis. No edema. HEENT: Normal.   Telemetry: NSR  Labs: Basic Metabolic Panel:  Recent Labs Lab 07/25/14 0322 07/26/14 0525 07/27/14 0455 07/28/14 0331 07/29/14 0232  NA 135* 135* 137 136* 140  K 4.1 4.2 3.9 3.9 3.7  CL 102 101 102 102 105  CO2 21 22 23 24 24   GLUCOSE 117* 140* 111* 118* 111*  BUN 33* 31* 23 21 20   CREATININE 0.79 0.79 0.79 0.72 0.82  CALCIUM 9.3 9.0 9.3 8.7 9.3    Liver Function Tests:  Recent Labs Lab 07/23/14 1410  AST 23  ALT 12  ALKPHOS 49  BILITOT 0.5  PROT 6.3  ALBUMIN 3.3*   No results found for this basename: LIPASE, AMYLASE,  in the last 168 hours No results found for this basename: AMMONIA,  in the last 168 hours  CBC:  Recent Labs Lab 07/23/14 1410  07/27/14 0455 07/28/14 0331 07/28/14 1925 07/29/14 0232 07/30/14 0438  WBC 9.3  < > 10.4 8.5 10.4 10.2  8.3  NEUTROABS 7.5  --   --   --   --   --   --   HGB 8.2*  < > 9.8* 8.4* 10.0* 9.9* 8.8*  HCT 26.9*  < > 30.9* 27.2* 31.8* 31.9* 28.3*  MCV 108.5*  < > 99.7 103.0* 104.3* 103.2* 106.4*  PLT 178  < > 138* 131* 151 148* 142*  < > = values in this interval not displayed.  INR:  Recent Labs Lab 07/25/14 0322 07/27/14 0455 07/28/14 0331 07/29/14 0232 07/30/14 0438  INR 2.23* 2.60* 2.37* 1.77* 1.52*    Other results:    Imaging: Dg Chest 2 View  07/29/2014   CLINICAL DATA:  Shortness of breath, left ventricular assistance device.  EXAM: CHEST  2 VIEW  COMPARISON:  July 23, 2014.  FINDINGS: Stable cardiomediastinal silhouette. Sternotomy wires are noted. Left ventricular assistance device is again noted. Left-sided pacemaker is  unchanged in position. No pneumothorax or significant pleural effusion is noted. No acute pulmonary disease is noted.  IMPRESSION: No acute cardiopulmonary abnormality seen.   Electronically Signed   By: Sabino Dick M.D.   On: 07/29/2014 12:41     Medications:     Scheduled Medications: . sodium chloride   Intravenous Once  . sodium chloride   Intravenous Once  . atorvastatin  40 mg Oral q1800  . budesonide-formoterol  2 puff Inhalation BID  . cholecalciferol  2,000 Units Oral QHS  . citalopram  40 mg Oral QHS  . docusate sodium  200 mg Oral QHS  . levothyroxine  50 mcg Oral QHS  . losartan  25 mg Oral Daily  . pantoprazole  40 mg Oral BID  . sodium chloride  3 mL Intravenous Q12H  . tiotropium  18 mcg Inhalation Daily  . cyanocobalamin  1,000 mcg Oral QHS  . vitamin C  500 mg Oral BID  . Warfarin - Pharmacist Dosing Inpatient   Does not apply q1800    Infusions: . sodium chloride    . sodium chloride      PRN Medications: sodium chloride, albuterol, fentaNYL, meperidine (DEMEROL) injection, nitroGLYCERIN, promethazine, sodium chloride   Assessment/plan:   73 yo with CAD s/p CABG and ischemic cardiomyopathy s/p Heartmate II LVAD was admitted with increased exertional dyspnea. He was found to be anemic.  1. Exertional dyspnea 2. Anemia: - He will remain off ASA but will have to stay on coumadin, goal INR 1.8-2.5.  - Push enteroscopy 10/28 negative - Colonoscopy 10/29 without source of bleeding. 3. Chronic systolic CHF: Ischemic CMP, has Heartmate II LVAD.  - Volume status stable and VAD parameters at baseline.  - MAPs stable.  4. PAF 5. CAD s/p CABG 6. HTN  Hgb dropping again. No obvious source. Spoke to Dr. Carlean Purl. Will need repeat capsule. Will hold oral iron to avoid small bowel staining which would limit utility of capsule. Consider octreotide tomorrow.  VAD parameters look good.  INR 1.5. Would typically start heparin but with bleeding will hold off. Will give  higher dose of coumadin today.   Length of Stay: 7  Glori Bickers MD 07/30/2014, 8:24 AM  VAD Team --- VAD ISSUES ONLY--- Pager [phone] (7am - 7am)  Advanced Heart Failure Team  Pager [phone] (M-F; 7a - 4p)  Please contact [facility] for night-coverage after hours (4p -7a ) and weekends on [email or web address]

## 2014-07-30 NOTE — Progress Notes (Signed)
ANTICOAGULATION CONSULT NOTE - Follow Up Consult  Pharmacy Consult for coumadin Indication: LVAD  No Known Allergies  Patient Measurements: Height: 6' (182.9 cm) Weight: 208 lb 1.8 oz (94.4 kg) IBW/kg (Calculated) : 77.6   Vital Signs: Temp: 97.5 F (36.4 C) (10/31 0453) Temp Source: Oral (10/31 0453) Pulse Rate: 72 (10/31 0803)  Labs:  Recent Labs  07/28/14 0331  07/29/14 0232 07/30/14 0438 07/30/14 0950  HGB 8.4*  < > 9.9* 8.8* 9.5*  HCT 27.2*  < > 31.9* 28.3* 30.5*  PLT 131*  < > 148* 142* 150  LABPROT 26.1*  --  20.8* 18.5*  --   INR 2.37*  --  1.77* 1.52*  --   CREATININE 0.72  --  0.82  --   --   < > = values in this interval not displayed.  Estimated Creatinine Clearance: 97.1 ml/min (by C-G formula based on Cr of 0.82).   Assessment: 73 yo man continuing coumadin for LVAD.  His INR remains below goal range on home dose. CBC is stable overnight  Colonoscopy negative.   INR down this am to 1.52.    Plan to keep INR on low end. No heparin bridge per HF team.  Goal of Therapy:  INR 1.8-2.5 Monitor platelets by anticoagulation protocol: Yes   Plan:  Warfarin 5mg  tonight INR check in am   Thanks for allowing pharmacy to be a part of this patient's care.  Heide Guile, PharmD, BCPS Clinical Pharmacist Pager [phone]   07/30/2014 12:36 PM

## 2014-07-30 NOTE — Op Note (Signed)
[facility] [place], [id number]   COLONOSCOPY PROCEDURE REPORT  PATIENT: [name], [name]  MR#: [id number] BIRTHDATE: 01/22/1941 , 85  yrs. old GENDER: male ENDOSCOPIST: Gatha Mayer, MD, [facility] PROCEDURE DATE:  07/29/2014 PROCEDURE:   Colonoscopy, diagnostic First Screening Colonoscopy - Avg.  risk and is 50 yrs.  old or older - No.  Prior Negative Screening - Now for repeat screening. N/A  History of Adenoma - Now for follow-up colonoscopy & has been > or = to 3 yrs.  N/A  Polyps Removed Today? No.  Polyps Removed Today? No.  Recommend repeat exam, <10 yrs? Polyps Removed Today? No.  Recommend repeat exam, <10 yrs? No. ASA CLASS:   Class III INDICATIONS:recurrent anemia, heme+ in setting of warfarin and LVAD.  MEDICATIONS: Monitored anesthesia care and Per Anesthesia  DESCRIPTION OF PROCEDURE:   After the risks benefits and alternatives of the procedure were thoroughly explained, informed consent was obtained.  The digital rectal exam revealed no abnormalities of the rectum.   The Pentax Adult Colon [id number] endoscope was introduced through the anus and advanced to the cecum, which was identified by both the appendix and ileocecal valve. No adverse events experienced.   The quality of the prep was Moviprep fair  The instrument was then slowly withdrawn as the colon was fully examined.      COLON FINDINGS: Two sessile polyps ranging from 3 to 9mm in size were found in the right colon.   The examination was otherwise normal.  Retroflexed views revealed internal hemorrhoids. The time to cecum=14 minutes 0 seconds.  Withdrawal time=36 minutes 0 seconds.  The scope was withdrawn and the procedure completed. COMPLICATIONS: There were no immediate complications.  ENDOSCOPIC IMPRESSION: 1.   Two sessile polyps ranging from 3 to 54mm in size were found in the right colon 2.   The colon examination was otherwise normal with fair prep  after extensive irrigation and suctioning 3.    Internal hemorrhoids in rectum  RECOMMENDATIONS: Review prior capsule stop oral iron - limits exams and confuses re: melena, and he is getting injectable iron  eSigned:  Gatha Mayer, MD, [facility] 07/30/2014 4:19 PM

## 2014-07-30 NOTE — Progress Notes (Signed)
    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 Haroldine Laws  If the capsule is negative I think, even without elevated LDH, etc hemolysis could be culprit?  Gatha Mayer, MD, [facility] [phone] (pager) 07/30/2014 10:06 AM

## 2014-07-31 DIAGNOSIS — K922 Gastrointestinal hemorrhage, unspecified: Secondary | ICD-10-CM | POA: Insufficient documentation

## 2014-07-31 LAB — CBC
HEMATOCRIT: 24 % — AB (ref 39.0–52.0)
HEMATOCRIT: 22.3 % — AB (ref 39.0–52.0)
HEMOGLOBIN: 7.3 g/dL — AB (ref 13.0–17.0)
Hemoglobin: 7 g/dL — ABNORMAL LOW (ref 13.0–17.0)
MCH: 32.2 pg (ref 26.0–34.0)
MCH: 33.2 pg (ref 26.0–34.0)
MCHC: 30.4 g/dL (ref 30.0–36.0)
MCHC: 31.4 g/dL (ref 30.0–36.0)
MCV: 105.7 fL — AB (ref 78.0–100.0)
MCV: 105.7 fL — ABNORMAL HIGH (ref 78.0–100.0)
PLATELETS: 135 10*3/uL — AB (ref 150–400)
Platelets: 140 10*3/uL — ABNORMAL LOW (ref 150–400)
RBC: 2.27 MIL/uL — ABNORMAL LOW (ref 4.22–5.81)
RBC: 2.11 MIL/uL — AB (ref 4.22–5.81)
RDW: 24.4 % — AB (ref 11.5–15.5)
RDW: 24.2 % — ABNORMAL HIGH (ref 11.5–15.5)
WBC: 7 10*3/uL (ref 4.0–10.5)
WBC: 6.9 10*3/uL (ref 4.0–10.5)

## 2014-07-31 LAB — BASIC METABOLIC PANEL
Anion gap: 8 (ref 5–15)
BUN: 27 mg/dL — ABNORMAL HIGH (ref 6–23)
CO2: 25 meq/L (ref 19–32)
CREATININE: 0.77 mg/dL (ref 0.50–1.35)
Calcium: 8.6 mg/dL (ref 8.4–10.5)
Chloride: 104 mEq/L (ref 96–112)
GFR calc Af Amer: >90 mL/min (ref >90)
GFR calc non Af Amer: 89 mL/min — ABNORMAL LOW (ref >90)
GLUCOSE: 120 mg/dL — AB (ref 70–99)
Potassium: 3.7 mEq/L (ref 3.7–5.3)
Sodium: 137 mEq/L (ref 137–147)

## 2014-07-31 LAB — PROTIME-INR
INR: 1.67 — ABNORMAL HIGH (ref 0.00–1.49)
Prothrombin Time: 19.8 seconds — ABNORMAL HIGH (ref 11.6–15.2)

## 2014-07-31 LAB — PREPARE RBC (CROSSMATCH)

## 2014-07-31 MED ORDER — SODIUM CHLORIDE 0.9 % IV SOLN: INTRAVENOUS | Status: DC

## 2014-07-31 MED ORDER — SODIUM CHLORIDE 0.9 % IV SOLN: Freq: Once | INTRAVENOUS | Status: DC

## 2014-07-31 MED ORDER — WARFARIN SODIUM 5 MG PO TABS
5.0000 mg | ORAL_TABLET | Freq: Once | ORAL | Status: AC
  Administered 2014-07-31: 5 mg via ORAL
  Filled 2014-07-31: qty 1

## 2014-07-31 MED ORDER — FUROSEMIDE 10 MG/ML IJ SOLN
20.0000 mg | Freq: Once | INTRAMUSCULAR | Status: AC
Start: 2014-07-31 — End: 2014-08-01
  Administered 2014-08-01: 20 mg via INTRAVENOUS
  Filled 2014-07-31: qty 2

## 2014-07-31 NOTE — Progress Notes (Signed)
ANTICOAGULATION CONSULT NOTE - Follow Up Consult  Pharmacy Consult for coumadin Indication: LVAD  No Known Allergies  Patient Measurements: Height: 6' (182.9 cm) Weight: 208 lb 11.2 oz (94.666 kg) IBW/kg (Calculated) : 77.6   Vital Signs: Temp: 97.8 F (36.6 C) (11/01 0230) Temp Source: Oral (11/01 0230) Pulse Rate: 75 (11/01 0230)  Labs:  Recent Labs  07/29/14 0232 07/30/14 0438 07/30/14 0950 07/31/14 0445  HGB 9.9* 8.8* 9.5*  --   HCT 31.9* 28.3* 30.5*  --   PLT 148* 142* 150  --   LABPROT 20.8* 18.5*  --  19.8*  INR 1.77* 1.52*  --  1.67*  CREATININE 0.82  --   --  0.77    Estimated Creatinine Clearance: 99.6 mL/min (by C-G formula based on Cr of 0.77).   Assessment: 73 yo man continuing coumadin for LVAD.  His INR remains below goal range on home dose.  Colonoscopy negative.   INR is up to 1.67  Plan to keep INR on low end. No heparin bridge per HF team.  Goal of Therapy:  INR 1.8-2.5 Monitor platelets by anticoagulation protocol: Yes   Plan:  Warfarin 5mg  tonight INR check in am   Thanks for allowing pharmacy to be a part of this patient's care.  Heide Guile, PharmD, BCPS Clinical Pharmacist Pager [phone]   07/31/2014 12:50 PM

## 2014-07-31 NOTE — Op Note (Signed)
CAPSULE ENDOSCOPY OF THE SMALL BOWEL    INDICATIONS  Recurrent Anemia, Heme + in setting of warfarin and LVAD. Past EGD, capsule and colonoscopy unrevealing. Push enteroscopy 2 d a go and colonoscopy 1 d ago unrevealing  FINDINGS 1) Definite bleeding in proximal duodenum 2) Melenic fluid in small bowel distal to that 3) Cannot see well enough to identify lesion(s) due to 1 and 2  IMPRESSION/PLAN  Bleeding in proximal duodenum. Possible causes are: 1) Missed lesion 2) Trauma from enteroscopy 3) both  He should have an EGD tomorrow Will plan for that with Dr. Hilarie Fredrickson

## 2014-07-31 NOTE — Progress Notes (Addendum)
Patient ID: [name], male   DOB: 08-Jul-1941, 73 y.o.   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 Quit smoking 2003. Uses CPAP and O2 every night.   He is s/p LVAD HM II implanted 01/12/13 under DT criteria.   Admitted to [facility] 09/13/13 - 09/14/13 for increased fatigue and dyspnea. Found to have mild CHF and anemia. Diuresed with IV lasix and transitioned to PO lasix 40 mg twice a week. Discharge weight 203 lbs on inpatient scale. Admitting labs revealed Hgb 7.7 for which he received 2 units PCs with appropriate rise in Hgb.   Admit 6/8-6/12/15 symptomatic anemia. Had EGD/Colonoscopy which was unrevealing. 6 mm polyp clipped and removed. CT abdomen showed cirrhosis. Capsule study showed ? small bowel angioectasia that may have been source of slow GI blood loss.   Has received 7u PRBCs total.  Underwent push enteroscopy 10/28 with no source of bleeding found, however Hgb continued to trend down.  Underwent colonoscopy 10/29 - no source of bleeding.   Hgb up, 8.8 => 9.5. Breathing improved, no dyspnea walking in halls.  Capsule completed, no results yet (has not been picked up).   HeartMate II LVAD: Flow: 5.2    Speed: 9200    Power: 5.6   PI:   6.3  no PI events       Objective:    Vital Signs:   Temp:  [97.8 F (36.6 C)-98.6 F (37 C)] 97.8 F (36.6 C) (11/01 0230) Pulse Rate:  [75-90] 75 (11/01 0230) Resp:  [18-20] 18 (11/01 0230) SpO2:  [95 %-99 %] 97 % (11/01 0837) Weight:  [208 lb (94.348 kg)-208 lb 11.2 oz (94.666 kg)] 208 lb 11.2 oz (94.666 kg) (11/01 0424) Last BM Date: 07/29/14 Mean arterial Pressure ~78-80  Intake/Output:   Intake/Output Summary (Last 24 hours) at 07/31/14 0913 Last data filed at 07/31/14 0456  Gross per 24 hour  Intake    480 ml  Output   1650 ml  Net  -1170 ml      Physical Exam:   General: Elderly,  NAD  Neck: No JVD, no thyromegaly or thyroid nodule.  Lungs: Clear to auscultation bilaterally with normal respiratory effort.  CV: LVAD hum present;  Abdomen: Soft, nontender, no hepatosplenomegaly, no distention. Driveline dressing C/D/I Skin: Intact without lesions or rashes.  Neurologic: Alert and oriented x 3.  Psych: Normal affect.  Extremities: No clubbing or cyanosis. No edema. HEENT: Normal.   Telemetry: NSR  Labs: Basic Metabolic Panel:  Recent Labs Lab 07/26/14 0525 07/27/14 0455 07/28/14 0331 07/29/14 0232 07/31/14 0445  NA 135* 137 136* 140 137  K 4.2 3.9 3.9 3.7 3.7  CL 101 102 102 105 104  CO2 22 23 24 24 25   GLUCOSE 140* 111* 118* 111* 120*  BUN 31* 23 21 20  27*  CREATININE 0.79 0.79 0.72 0.82 0.77  CALCIUM 9.0 9.3 8.7 9.3 8.6    Liver Function Tests: No results for input(s): AST, ALT, ALKPHOS, BILITOT, PROT, ALBUMIN in the last 168 hours. No results for input(s): LIPASE, AMYLASE in the last 168 hours. No results for input(s): AMMONIA in the last 168 hours.  CBC:  Recent Labs Lab 07/28/14 0331 07/28/14 1925 07/29/14 0232 07/30/14 0438 07/30/14 0950  WBC 8.5 10.4 10.2 8.3 9.4  HGB 8.4* 10.0* 9.9* 8.8* 9.5*  HCT 27.2* 31.8* 31.9* 28.3* 30.5*  MCV 103.0* 104.3* 103.2* 106.4* 105.5*  PLT 131* 151 148* 142* 150    INR:  Recent Labs Lab 07/27/14 0455 07/28/14 0331 07/29/14 0232 07/30/14 0438 07/31/14 0445  INR 2.60* 2.37* 1.77* 1.52* 1.67*    Other results:    Imaging: Dg Chest 2 View  07/29/2014   CLINICAL DATA:  Shortness of breath, left ventricular assistance device.  EXAM: CHEST  2 VIEW  COMPARISON:  July 23, 2014.  FINDINGS: Stable cardiomediastinal silhouette. Sternotomy wires are noted. Left ventricular assistance device is again noted. Left-sided pacemaker is unchanged in position. No pneumothorax or significant pleural effusion is noted. No acute pulmonary disease is noted.   IMPRESSION: No acute cardiopulmonary abnormality seen.   Electronically Signed   By: Sabino Dick M.D.   On: 07/29/2014 12:41     Medications:     Scheduled Medications: . sodium chloride   Intravenous Once  . sodium chloride   Intravenous Once  . atorvastatin  40 mg Oral q1800  . budesonide-formoterol  2 puff Inhalation BID  . cholecalciferol  2,000 Units Oral QHS  . citalopram  40 mg Oral QHS  . docusate sodium  200 mg Oral QHS  . levothyroxine  50 mcg Oral QHS  . losartan  25 mg Oral Daily  . pantoprazole  40 mg Oral BID  . sodium chloride  3 mL Intravenous Q12H  . tiotropium  18 mcg Inhalation Daily  . cyanocobalamin  1,000 mcg Oral QHS  . vitamin C  500 mg Oral BID  . Warfarin - Pharmacist Dosing Inpatient   Does not apply q1800    Infusions: . sodium chloride    . sodium chloride      PRN Medications: sodium chloride, albuterol, fentaNYL, meperidine (DEMEROL) injection, nitroGLYCERIN, promethazine, sodium chloride   Assessment/plan:   73 yo with CAD s/p CABG and ischemic cardiomyopathy s/p Heartmate II LVAD was admitted with increased exertional dyspnea. He was found to be anemic.  1. Exertional dyspnea: Improved considerably with transfusion.  2. Anemia:  Hemoglobin up today.  - He will remain off ASA but will have to stay on coumadin, goal INR 1.8-2.5.  - Push enteroscopy 10/28 negative - Colonoscopy 10/29 without source of bleeding. - Result of capsule endoscopy pending.  - Normal LDH makes hemolysis unlikely, will repeat LDH in am.  - INR 1.67 => rising.  Will hold off heparin gtt given concern for recent GI bleed.  3. Chronic systolic CHF: Ischemic CMP, has Heartmate II LVAD.  - Volume status stable and VAD parameters at baseline.  - MAPs stable.  4. PAF 5. CAD s/p CABG 6. HTN  If hemoglobin stable overnight and no urgent issues with capsule study, would let him go home tomorrow.   Length of Stay: 8  Loralie Champagne MD 07/31/2014, 9:13 AM  VAD Team  --- VAD ISSUES ONLY--- Pager [phone] (7am - 7am)  Advanced Heart Failure Team  Pager [phone] (M-F; 7a - 4p)  Please contact [facility] for night-coverage after hours (4p -7a ) and weekends on [email or web address]

## 2014-07-31 NOTE — Progress Notes (Signed)
Dayshift RN got result of Hgb-7.3. Dayshift RN paged VAD pager at [phone]. No one answered VAD pager. Dayshift RN paged Dr. Donnajean Lopes who is on call for Dr. Benjamine Mola. Dr. Elias Else ordered Stat CBC to repeat the HGb since there was an increase yesterday with no blood given to make sure the result was accurate.   2015-Hgb -7.0 on new CBC. Pt in no distress other than being a little anxious over Hgb dropping again. Reassured pt. Talked with Dr. Elias Else and made aware of 7.0 hgb. Order for  1u PRBC to be transfused with 20mg  of IV Lasic after the unit goes in. Will continue to assess and monitor pt.

## 2014-07-31 NOTE — Progress Notes (Signed)
    Capsule endoscopy study shows bleeding in proximal duodenum.  BUN up (27) No Hgb today I ordered a CBC  EGD tomorrow, Dr. Hilarie Fredrickson  Bleeding is either something I missed, trauma from enteroscopy or perhaps both. It looked significant and active on the exam.  I have explained this to him.  Gatha Mayer, MD, [facility] [phone] (pager) 07/31/2014 4:17 PM

## 2014-07-31 NOTE — Op Note (Deleted)
SMALL BOWEL CAPUSLE ENDOSCOPY  INDICATIONS:   recurrent heme + anemia in LVAD patient on warfarin. EGD, enteroscopy, colonoscopy x 2 and prior capsule endoscopy unrevealing Good study and prep   FINDINGS:  1) A few very minor areas of  red spot or erythema that are typically insignificant and I think ars still so even in an LVAD patient on warfarin   IMPRESSION AND PLAN:  I think anemia is multifactorial and we do not have good evidence for GI blood loss as only or primary cause (but understand that small hard to detect lesions could bleed on anti-coagulation) See how he does off oral iron - that has caused prep issues with colonoscopy x 2 and clouds issue of possible melena.  Leave off ASA and oral iron.  Consider hematology evaluation though hemolysis markers are not  abnormal .  Gatha Mayer, MD, [facility]

## 2014-08-01 ENCOUNTER — Inpatient Hospital Stay (HOSPITAL_COMMUNITY): Payer: Non-veteran care | Admitting: Anesthesiology

## 2014-08-01 ENCOUNTER — Encounter (HOSPITAL_COMMUNITY)
Admission: EM | Disposition: A | Discharge status: Home / Self Care | Payer: Self-pay | Source: Home / Self Care | Attending: Cardiology

## 2014-08-01 ENCOUNTER — Encounter (HOSPITAL_COMMUNITY): Payer: Self-pay | Admitting: Internal Medicine

## 2014-08-01 DIAGNOSIS — I482 Chronic atrial fibrillation, unspecified: Secondary | ICD-10-CM | POA: Insufficient documentation

## 2014-08-01 DIAGNOSIS — K31811 Angiodysplasia of stomach and duodenum with bleeding: Principal | ICD-10-CM

## 2014-08-01 HISTORY — PX: ESOPHAGOGASTRODUODENOSCOPY: SHX5428

## 2014-08-01 LAB — HEMOGLOBIN AND HEMATOCRIT, BLOOD
HCT: 25.7 % — ABNORMAL LOW (ref 39.0–52.0)
Hemoglobin: 8.2 g/dL — ABNORMAL LOW (ref 13.0–17.0)

## 2014-08-01 LAB — BASIC METABOLIC PANEL
Anion gap: 9 (ref 5–15)
BUN: 24 mg/dL — ABNORMAL HIGH (ref 6–23)
CALCIUM: 9 mg/dL (ref 8.4–10.5)
CHLORIDE: 106 meq/L (ref 96–112)
CO2: 25 meq/L (ref 19–32)
Creatinine, Ser: 0.73 mg/dL (ref 0.50–1.35)
GFR calc Af Amer: >90 mL/min (ref >90)
GFR calc non Af Amer: >90 mL/min (ref >90)
GLUCOSE: 125 mg/dL — AB (ref 70–99)
Potassium: 3.9 mEq/L (ref 3.7–5.3)
SODIUM: 140 meq/L (ref 137–147)

## 2014-08-01 LAB — PREPARE RBC (CROSSMATCH)

## 2014-08-01 LAB — PROTIME-INR
INR: 1.72 — ABNORMAL HIGH (ref 0.00–1.49)
Prothrombin Time: 20.3 seconds — ABNORMAL HIGH (ref 11.6–15.2)

## 2014-08-01 LAB — CBC
HCT: 24.8 % — ABNORMAL LOW (ref 39.0–52.0)
Hemoglobin: 7.8 g/dL — ABNORMAL LOW (ref 13.0–17.0)
MCH: 32.5 pg (ref 26.0–34.0)
MCHC: 31.5 g/dL (ref 30.0–36.0)
MCV: 103.3 fL — AB (ref 78.0–100.0)
Platelets: 138 10*3/uL — ABNORMAL LOW (ref 150–400)
RBC: 2.4 MIL/uL — ABNORMAL LOW (ref 4.22–5.81)
RDW: 23.7 % — AB (ref 11.5–15.5)
WBC: 8.3 10*3/uL (ref 4.0–10.5)

## 2014-08-01 LAB — MAGNESIUM: MAGNESIUM: 1.9 mg/dL (ref 1.5–2.5)

## 2014-08-01 LAB — LACTATE DEHYDROGENASE: LDH: 226 U/L (ref 94–250)

## 2014-08-01 SURGERY — EGD (ESOPHAGOGASTRODUODENOSCOPY)
Anesthesia: Monitor Anesthesia Care

## 2014-08-01 MED ORDER — DOCUSATE SODIUM 100 MG PO CAPS
100.0000 mg | ORAL_CAPSULE | Freq: Every morning | ORAL | Status: DC
  Administered 2014-08-02 – 2014-08-04 (×3): 100 mg via ORAL
  Filled 2014-08-01 (×3): qty 1

## 2014-08-01 MED ORDER — SODIUM CHLORIDE 0.9 % IV SOLN
Freq: Once | INTRAVENOUS | Status: AC
  Administered 2014-08-01: 19:00:00 via INTRAVENOUS

## 2014-08-01 MED ORDER — SODIUM CHLORIDE 0.9 % IV SOLN: Freq: Once | INTRAVENOUS | Status: DC

## 2014-08-01 MED ORDER — PROPOFOL 10 MG/ML IV BOLUS
INTRAVENOUS | Status: DC | PRN
  Administered 2014-08-01: 20 mg via INTRAVENOUS
  Administered 2014-08-01: 40 mg via INTRAVENOUS
  Administered 2014-08-01 (×3): 20 mg via INTRAVENOUS

## 2014-08-01 MED ORDER — LIDOCAINE HCL (CARDIAC) 20 MG/ML IV SOLN
INTRAVENOUS | Status: DC | PRN
  Administered 2014-08-01: 30 mg via INTRAVENOUS
  Administered 2014-08-01: 20 mg via INTRAVENOUS

## 2014-08-01 MED ORDER — PHENYLEPHRINE HCL 10 MG/ML IJ SOLN
10.0000 mg | INTRAVENOUS | Status: DC | PRN
  Administered 2014-08-01: 10 ug/min via INTRAVENOUS

## 2014-08-01 MED ORDER — PHENYLEPHRINE HCL 10 MG/ML IJ SOLN
INTRAMUSCULAR | Status: DC | PRN
  Administered 2014-08-01: 40 ug via INTRAVENOUS

## 2014-08-01 MED ORDER — FUROSEMIDE 10 MG/ML IJ SOLN
20.0000 mg | Freq: Once | INTRAMUSCULAR | Status: AC
  Administered 2014-08-01: 20 mg via INTRAVENOUS
  Filled 2014-08-01: qty 2

## 2014-08-01 MED ORDER — WARFARIN SODIUM 3 MG PO TABS
3.0000 mg | ORAL_TABLET | Freq: Once | ORAL | Status: AC
  Administered 2014-08-01: 3 mg via ORAL
  Filled 2014-08-01: qty 1

## 2014-08-01 MED ORDER — GLUCAGON HCL RDNA (DIAGNOSTIC) 1 MG IJ SOLR
INTRAMUSCULAR | Status: DC | PRN
  Administered 2014-08-01 (×2): 0.25 mg via INTRAVENOUS

## 2014-08-01 MED ORDER — PROPOFOL INFUSION 10 MG/ML OPTIME
INTRAVENOUS | Status: DC | PRN
  Administered 2014-08-01: 25 ug/kg/min via INTRAVENOUS

## 2014-08-01 NOTE — Progress Notes (Signed)
ANTICOAGULATION CONSULT NOTE   Pharmacy Consult for coumadin Indication: LVAD  No Known Allergies  Patient Measurements: Height: 6' (182.9 cm) Weight: 210 lb 12.8 oz (95.618 kg) IBW/kg (Calculated) : 77.6   Vital Signs: Temp: 97.7 F (36.5 C) (11/02 1411) Temp Source: Oral (11/02 1411) Pulse Rate: 66 (11/02 1333)  Labs:  Recent Labs  07/30/14 0438  07/31/14 0445 07/31/14 1715 07/31/14 1902 08/01/14 0353  HGB 8.8*  < >  --  7.3* 7.0* 7.8*  HCT 28.3*  < >  --  24.0* 22.3* 24.8*  PLT 142*  < >  --  140* 135* 138*  LABPROT 18.5*  --  19.8*  --   --  20.3*  INR 1.52*  --  1.67*  --   --  1.72*  CREATININE  --   --  0.77  --   --  0.73  < > = values in this interval not displayed.  Estimated Creatinine Clearance: 100.1 mL/min (by C-G formula based on Cr of 0.73).   Assessment: 72 yo man continuing coumadin for LVAD.  His INR remains below goal range on home dose.  Colonoscopy negative. Capsule endoscopy showing duodenal bleeding; for EGD today  INR is 1.7. Received blood this morning.   Plan to keep INR on low end. No heparin bridge per HF team.  S/p EGD today. Patient underwent argon plasma coagulation and hemoclip placed on bleeding lesion.  Goal of Therapy:  INR 1.8-2.5 Monitor platelets by anticoagulation protocol: Yes   Plan: Warfarin 3mg  tonight Follow for any further s/s of bleeding  Thanks for allowing pharmacy to be a part of this patient's care.  Erin Hearing PharmD., BCPS Clinical Pharmacist Pager [phone] 08/01/2014 2:46 PM

## 2014-08-01 NOTE — Progress Notes (Signed)
SOB with exertion post EGD.   Hgb 8.2 HCt 25.7  Transfuse 1UPRBC now with 20 mg IV lasix after completed.  Discussed with Dr Donnamae Jude NP-C 5:24 PM   Agree.   Benay Spice 9:36 AM

## 2014-08-01 NOTE — Progress Notes (Signed)
Patient ID: [name], male   DOB: 09/07/1941, 73 y.o.   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 Quit smoking 2003. Uses CPAP and O2 every night.   He is s/p LVAD HM II implanted 01/12/13 under DT criteria.   Admitted to [facility] 09/13/13 - 09/14/13 for increased fatigue and dyspnea. Found to have mild CHF and anemia. Diuresed with IV lasix and transitioned to PO lasix 40 mg twice a week. Discharge weight 203 lbs on inpatient scale. Admitting labs revealed Hgb 7.7 for which he received 2 units PCs with appropriate rise in Hgb.   Admit 6/8-6/12/15 symptomatic anemia. Had EGD/Colonoscopy which was unrevealing. 6 mm polyp clipped and removed. CT abdomen showed cirrhosis. Capsule study showed ? small bowel angioectasia that may have been source of slow GI blood loss.   Has received 9u PRBCs total.  Underwent push enteroscopy 10/28 with no source of bleeding found, however Hgb continued to trend down.  Underwent colonoscopy 10/29 - no source of bleeding.  Capsule Endoscopy showed bleeding form duodenum.   Last night he had increased SOB HGb back down to 7.0.  Over night he has received 2 UPRBCs this am.  Feeling better this am.    HeartMate II LVAD: Flow: 5.1    Speed: 9200    Power: 5.1   PI:   5.5  no PI events       Objective:    Vital Signs:   Temp:  [97.9 F (36.6 C)-98.4 F (36.9 C)] 98.2 F (36.8 C) (11/02 0615) Pulse Rate:  [69-84] 71 (11/02 0615) Resp:  [16-20] 20 (11/02 0615) SpO2:  [96 %-98 %] 97 % (11/02 0615) Weight:  [208 lb 4.8 oz (94.484 kg)] 208 lb 4.8 oz (94.484 kg) (11/02 0538) Last BM Date: 07/29/14 Mean arterial Pressure ~70-74  Intake/Output:   Intake/Output Summary (Last 24 hours) at 08/01/14 0643 Last data filed at 08/01/14 0600  Gross per 24 hour  Intake 1053.33 ml  Output    2350 ml  Net -1296.67 ml     Physical Exam:   General: Elderly,  NAD  Neck: No JVD, no thyromegaly or thyroid nodule.  Lungs: Clear to auscultation bilaterally with normal respiratory effort.  CV: LVAD hum present;  Abdomen: Soft, nontender, no hepatosplenomegaly, no distention. Driveline dressing C/D/I Skin: Intact without lesions or rashes.  Neurologic: Alert and oriented x 3.  Psych: Normal affect.  Extremities: No clubbing or cyanosis. No edema. HEENT: Normal.   Telemetry: NSR with PVCs NSVT  Labs: Basic Metabolic Panel:  Recent Labs Lab 07/27/14 0455 07/28/14 0331 07/29/14 0232 07/31/14 0445 08/01/14 0353  NA 137 136* 140 137 140  K 3.9 3.9 3.7 3.7 3.9  CL 102 102 105 104 106  CO2 23 24 24 25 25   GLUCOSE 111* 118* 111* 120* 125*  BUN 23 21 20  27* 24*  CREATININE 0.79 0.72 0.82 0.77 0.73  CALCIUM 9.3 8.7 9.3 8.6 9.0    Liver Function Tests: No results for input(s): AST, ALT, ALKPHOS, BILITOT, PROT, ALBUMIN in the last 168 hours. No results for input(s): LIPASE, AMYLASE in the last 168 hours. No results for input(s): AMMONIA in the last 168 hours.  CBC:  Recent Labs Lab 07/30/14 0438 07/30/14 0950 07/31/14 1715 07/31/14 1902 08/01/14 0353  WBC 8.3 9.4 7.0 6.9 8.3  HGB 8.8* 9.5* 7.3* 7.0* 7.8*  HCT 28.3* 30.5* 24.0* 22.3* 24.8*  MCV 106.4* 105.5* 105.7* 105.7* 103.3*  PLT 142* 150 140* 135* 138*    INR:  Recent Labs Lab 07/28/14 0331 07/29/14 0232 07/30/14 0438 07/31/14 0445 08/01/14 0353  INR 2.37* 1.77* 1.52* 1.67* 1.72*    Other results:    Imaging: No results found.   Medications:     Scheduled Medications: . sodium chloride   Intravenous Once  . sodium chloride   Intravenous Once  . sodium chloride   Intravenous Once  . sodium chloride   Intravenous Once  . atorvastatin  40 mg Oral q1800  . budesonide-formoterol  2 puff Inhalation BID  . cholecalciferol  2,000 Units Oral QHS  . citalopram  40 mg Oral QHS  . docusate  sodium  200 mg Oral QHS  . furosemide  20 mg Intravenous Once  . levothyroxine  50 mcg Oral QHS  . losartan  25 mg Oral Daily  . pantoprazole  40 mg Oral BID  . sodium chloride  3 mL Intravenous Q12H  . tiotropium  18 mcg Inhalation Daily  . cyanocobalamin  1,000 mcg Oral QHS  . vitamin C  500 mg Oral BID  . Warfarin - Pharmacist Dosing Inpatient   Does not apply q1800    Infusions: . sodium chloride    . sodium chloride    . sodium chloride      PRN Medications: sodium chloride, albuterol, fentaNYL, meperidine (DEMEROL) injection, nitroGLYCERIN, promethazine, sodium chloride   Assessment/plan:   73 yo with CAD s/p CABG and ischemic cardiomyopathy s/p Heartmate II LVAD was admitted with increased exertional dyspnea. He was found to be anemic.  1. Exertional dyspnea: Improved with trasnfusion this am.   2. Anemia:  Hemoglobin up today.  - He will remain off ASA but will have to stay on coumadin, goal INR 1.8-2.5.  - Push enteroscopy 10/28 negative - Colonoscopy 10/29 without source of bleeding. - Result of capsule endoscopy--> Showed bleeding from duodenum.  -Plan for EGD today.  - Normal LDH makes hemolysis unlikely, will repeat LDH in am.  - INR 1.72 => rising.  Will hold off heparin gtt given concern for recent GI bleed.  3. Chronic systolic CHF: Ischemic CMP, has Heartmate II LVAD.  - Volume status stable and VAD parameters at baseline. Weight unchanged - MAPs stable.  4. PAF 5. CAD s/p CABG 6. HTN   D/C 48 hours Length of Stay: 9  CLEGG,AMY NP-C  08/01/2014, 6:43 AM  VAD Team --- VAD ISSUES ONLY--- Pager [phone] (7am - 7am)  Advanced Heart Failure Team  Pager [phone] (M-F; 7a - 4p)  Please contact [facility] for night-coverage after hours (4p -7a ) and weekends on [email or web address]  Patient seen and examined with [name], NP. We discussed all aspects of the encounter. I agree with the assessment and plan as stated above.   Hgb back down again. Capsule  endoscopy + for bleeding lesion in duodenum - ? Scope trauma vs pre-existing. For EGD today. VAD parameters ok.   Ertha Nabor,MD 10:04 AM

## 2014-08-01 NOTE — Interval H&P Note (Signed)
History and Physical Interval Note: VCE + in the proximal duodenum. Plan repeat upper endoscopy versus small bowel enteroscopy today. HIGHER THAN BASELINE RISK.The nature of the procedure, as well as the risks, benefits, and alternatives were carefully and thoroughly reviewed with the patient. Ample time for discussion and questions allowed. The patient understood, was satisfied, and agreed to proceed.      08/01/2014 11:43 AM  [name]  has presented today for surgery, with the diagnosis of GI bleeding  The various methods of treatment have been discussed with the patient and family. After consideration of risks, benefits and other options for treatment, the patient has consented to  Procedure(s) with comments: ESOPHAGOGASTRODUODENOSCOPY (EGD) (N/A) - LVAD patient as a surgical intervention .  The patient's history has been reviewed, patient examined, no change in status, stable for surgery.  I have reviewed the patient's chart and labs.  Questions were answered to the patient's satisfaction.     Rilley Poulter M

## 2014-08-01 NOTE — Progress Notes (Signed)
VAD coordinator called to GI lab for upper endoscopy per Dr. Hilarie Fredrickson.  Pt awake, alert, wife at bedside in holding room    MAP HR Flow PI Power  Speed Pre procedure: 12:00  104 65 5.2 6.4 5.6  9200  Intra procedure: 12:15  100 68 5.5 5.3 5.7  9200 12:30  64 70 5.0 4.8 5.5  9200 12:45  72 73 5.4 5.9 5.7  Post procedure: 12:55  92 77 5.1 5.9 5.6  9200  Accompanied patient to holding room; pt awake. 2W nurse at bedside and will accompany patient back to floor.

## 2014-08-01 NOTE — Transfer of Care (Signed)
Immediate Anesthesia Transfer of Care Note  Patient: [name]  Procedure(s) Performed: Procedure(s) with comments: ESOPHAGOGASTRODUODENOSCOPY (EGD) (N/A) - LVAD patient  Patient Location: PACU  Anesthesia Type:MAC  Level of Consciousness: alert   Airway & Oxygen Therapy: Patient Spontanous Breathing  Post-op Assessment: Report given to PACU RN and Post -op Vital signs reviewed and stable  Post vital signs: Reviewed and stable  Complications: No apparent anesthesia complications

## 2014-08-01 NOTE — Anesthesia Postprocedure Evaluation (Signed)
  Anesthesia Post-op Note  Patient: [name]  Procedure(s) Performed: Procedure(s) with comments: ESOPHAGOGASTRODUODENOSCOPY (EGD) (N/A) - LVAD patient  Patient Location: PACU  Anesthesia Type:General  Level of Consciousness: awake and alert   Airway and Oxygen Therapy: Patient Spontanous Breathing and Patient connected to nasal cannula oxygen  Post-op Pain: none  Post-op Assessment: Post-op Vital signs reviewed, Patient's Cardiovascular Status Stable, Respiratory Function Stable and No signs of Nausea or vomiting  Post-op Vital Signs: Reviewed and stable  Last Vitals:  Filed Vitals:   08/01/14 1136  Pulse: 70  Temp:   Resp:     Complications: No apparent anesthesia complications

## 2014-08-01 NOTE — Op Note (Signed)
[facility] [place], [id number]   ENTEROSCOPY PROCEDURE REPORT     EXAM DATE: 08/01/2014  PATIENT NAME:      [name], [name]           MR #:      [id number]  BIRTHDATE:       05/06/1941      VISIT #:     [phone]  ATTENDING:     Jerene Bears, MD     STATUS:     inpatient ASSISTANT: REFERRING MD: Jolaine Artist, M.D. ASA CLASS:        Class IV INDICATIONS:  The patient is a 73 yr old male here for an enteroscopy procedure due to upper G.I.  bleeding and anemia in the setting of LVAD with bleeding seen on recent video capsule endoscopy PROCEDURE PERFORMED:     Small bowel enteroscopy with control of bleeding MEDICATIONS:     Monitored anesthesia care, Per Anesthesia , and Glucagon 0.5 mg IV  CONSENT: The patient understands the risks and benefits of the procedure and understands that these risks include, but are not limited to: sedation, allergic reaction, infection, perforation and/or bleeding. Alternative means of evaluation and treatment include, among others: physical exam, x-rays, and/or surgical intervention. The patient elects to proceed with this endoscopic procedure.  DESCRIPTION OF PROCEDURE: During intra-op preparation period all mechanical & medical equipment was checked for proper function. Hand hygiene and appropriate measures for infection prevention was taken. After the risks, benefits and alternatives of the procedure were thoroughly explained, Informed consent was verified, confirmed and timeout was successfully executed by the treatment team. The EG-[id number] ([id number]) endoscope was introduced through the mouth and advanced to the proximal jejunum jejunum. The prep was The overall prep quality was good.. The instrument was then slowly withdrawn while examining the mucosa circumferentially. The scope was then completely withdrawn from the patient and the procedure terminated. The pulse, BP, and O2  saturation were monitored and documented by the physician and the nursing staff throughout the entire procedure.  The patient was cared for as planned according to standard protocol, then discharged to recovery in stable condition and with appropriate post procedure care.  ESOPHAGUS: The mucosa of the esophagus appeared normal.   A small hiatal hernia was noted.  STOMACH: The mucosa of the stomach appeared normal.  DUODENUM: A small angiodysplastic lesion with persistent bleeding was found in the 2nd part of the duodenum. This area was not seen bleeding on initial inspection of the duodenum. On withdrawal from the proximal jejunum, the bleeding lesion was seen, rinsed and continued to bleed.  Argon plasma coagulation was applied to the site with good treatment effect (ERBE right colon setting).  Given the need for warfarin a prophylactic [place] Resolution hemoclip was placed at the treatment site with success.  No other angiodysplastic lesions were seen in the duodenum  JEJUNUM: The exam showed no abnormalities in the examined proximal jejunum.  No evidence of angiodysplastic lesions in the examined segment    ADVERSE EVENTS:      There were no immediate complications.  IMPRESSIONS:     1.  The mucosa of the esophagus appeared normal 2.  Small hiatal hernia 3.  The mucosa of the stomach appeared normal 4.  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  Otherwise normal duodenum  6.  The exam showed no abnormalities in the examined portions of the proximal jejunum  RECOMMENDATIONS:      1. Continue to monitor Hgb, transfuse if necessary 2. Would continue twice daily PPI to minimize acid exposure in the proximal small bowel     Jerene Bears, MD eSigned:  Jerene Bears, MD 08/01/2014 1:26 PM   cc:  The Patient Jolaine Artist,  MD     PATIENT NAME:  [name], [name] MR#: [id number]

## 2014-08-01 NOTE — Anesthesia Preprocedure Evaluation (Addendum)
Anesthesia Evaluation   Patient awake    Reviewed: Allergy & Precautions, H&P , NPO status , Patient's Chart, lab work & pertinent test results  Airway Mallampati: II       Dental   Pulmonary sleep apnea , COPDformer smoker,          Cardiovascular hypertension, + Cardiac Defibrillator  LAVD   Neuro/Psych    GI/Hepatic   Endo/Other    Renal/GU      Musculoskeletal   Abdominal   Peds  Hematology  (+) anemia , 7.6/24 H/H, has had 1 unit PRBC since this result,    Anesthesia Other Findings   Reproductive/Obstetrics                         Anesthesia Physical Anesthesia Plan  ASA: IV  Anesthesia Plan: MAC   Post-op Pain Management:    Induction:   Airway Management Planned: Nasal Cannula  Additional Equipment:   Intra-op Plan:   Post-operative Plan:   Informed Consent: I have reviewed the patients History and Physical, chart, labs and discussed the procedure including the risks, benefits and alternatives for the proposed anesthesia with the patient or authorized representative who has indicated his/her understanding and acceptance.     Plan Discussed with:   Anesthesia Plan Comments:         Anesthesia Quick Evaluation

## 2014-08-01 NOTE — H&P (View-Only) (Signed)
    Capsule endoscopy study shows bleeding in proximal duodenum.  BUN up (27) No Hgb today I ordered a CBC  EGD tomorrow, Dr. Hilarie Fredrickson  Bleeding is either something I missed, trauma from enteroscopy or perhaps both. It looked significant and active on the exam.  I have explained this to him.  Gatha Mayer, MD, [facility] [phone] (pager) 07/31/2014 4:17 PM

## 2014-08-01 NOTE — Progress Notes (Signed)
Called Dr. Elias Else and made him aware of Hgb-7.8 this am after the 1 unit of PRBC;s last night. Order for 1 unit PRBC's with 20mg  Lasix after it is given. Will continue to assess.

## 2014-08-01 NOTE — Plan of Care (Signed)
Problem: Consults Goal: GI Bleeding Patient Education See Patient Education Module for education specifics. Outcome: Completed/Met Date Met:  08/01/14  Problem: Phase I Progression Outcomes Goal: Pain controlled with appropriate interventions Outcome: Completed/Met Date Met:  08/01/14 Goal: OOB as tolerated unless otherwise ordered Outcome: Completed/Met Date Met:  08/01/14 Goal: Initial discharge plan identified Outcome: Completed/Met Date Met:  08/01/14 Goal: Voiding-avoid urinary catheter unless indicated Outcome: Completed/Met Date Met:  08/01/14 Goal: Hemodynamically stable Outcome: Completed/Met Date Met:  08/01/14  Problem: Phase II Progression Outcomes Goal: Hemodynamically stable Outcome: Completed/Met Date Met:  08/01/14 Goal: Progress activity as tolerated unless otherwise ordered Outcome: Completed/Met Date Met:  08/01/14 Goal: Tolerating diet Outcome: Completed/Met Date Met:  08/01/14

## 2014-08-01 NOTE — Progress Notes (Signed)
0900 Pt just got second unit of blood. For EGD today. Pt does not feel like walking at this time. Will follow up tomorrow. Graylon Good RN BSN 08/01/2014 8:59 AM

## 2014-08-02 ENCOUNTER — Encounter (HOSPITAL_COMMUNITY)
Admission: EM | Disposition: A | Discharge status: Home / Self Care | Payer: Self-pay | Source: Home / Self Care | Attending: Cardiology

## 2014-08-02 ENCOUNTER — Encounter (HOSPITAL_COMMUNITY): Payer: Self-pay | Admitting: Internal Medicine

## 2014-08-02 LAB — TYPE AND SCREEN
ABO/RH(D): O POS
Antibody Screen: NEGATIVE
UNIT DIVISION: 0
Unit division: 0
Unit division: 0

## 2014-08-02 LAB — CBC
HCT: 32.5 % — ABNORMAL LOW (ref 39.0–52.0)
HEMOGLOBIN: 10.2 g/dL — AB (ref 13.0–17.0)
MCH: 30.9 pg (ref 26.0–34.0)
MCHC: 31.4 g/dL (ref 30.0–36.0)
MCV: 98.5 fL (ref 78.0–100.0)
PLATELETS: 146 10*3/uL — AB (ref 150–400)
RBC: 3.3 MIL/uL — AB (ref 4.22–5.81)
RDW: 23.9 % — ABNORMAL HIGH (ref 11.5–15.5)
WBC: 9.7 10*3/uL (ref 4.0–10.5)

## 2014-08-02 LAB — PROTIME-INR
INR: 1.62 — AB (ref 0.00–1.49)
PROTHROMBIN TIME: 19.4 s — AB (ref 11.6–15.2)

## 2014-08-02 SURGERY — EGD (ESOPHAGOGASTRODUODENOSCOPY)
Anesthesia: Moderate Sedation

## 2014-08-02 MED ORDER — SODIUM CHLORIDE 0.9 % IV SOLN: INTRAVENOUS | Status: DC

## 2014-08-02 MED ORDER — WARFARIN SODIUM 6 MG PO TABS
6.0000 mg | ORAL_TABLET | Freq: Once | ORAL | Status: DC
  Filled 2014-08-02: qty 1

## 2014-08-02 MED ORDER — WARFARIN SODIUM 7.5 MG PO TABS
7.5000 mg | ORAL_TABLET | Freq: Once | ORAL | Status: AC
  Administered 2014-08-02: 7.5 mg via ORAL
  Filled 2014-08-02: qty 1

## 2014-08-02 MED ORDER — PANTOPRAZOLE SODIUM 40 MG PO TBEC
40.0000 mg | DELAYED_RELEASE_TABLET | Freq: Every day | ORAL | Status: DC
  Administered 2014-08-02 – 2014-08-04 (×3): 40 mg via ORAL
  Filled 2014-08-02 (×2): qty 1

## 2014-08-02 NOTE — Progress Notes (Signed)
          Daily Rounding Note  08/02/2014, 9:03 AM  LOS: 10 days   SUBJECTIVE:       Small volume stools are still black.  Pt feels well  OBJECTIVE:         Vital signs in last 24 hours:    Temp:  [97.7 F (36.5 C)-98.5 F (36.9 C)] 98.1 F (36.7 C) (11/03 0400) Pulse Rate:  [59-70] 63 (11/03 0800) Resp:  [10-27] 18 (11/03 0400) SpO2:  [94 %-98 %] 97 % (11/03 0400) Weight:  [206 lb 3.2 oz (93.532 kg)-210 lb 12.8 oz (95.618 kg)] 206 lb 3.2 oz (93.532 kg) (11/03 0408) Last BM Date: 07/31/14 Filed Weights   08/01/14 0538 08/01/14 1100 08/02/14 0408  Weight: 208 lb 4.8 oz (94.484 kg) 210 lb 12.8 oz (95.618 kg) 206 lb 3.2 oz (93.532 kg)   General: looks well.  nad   Heart: faint regular rhythm  Beneath hum of LVAD Chest: clear.  No resp distress or cough Abdomen: soft, NT, active BS  Extremities: no cce  Neuro/Psych:  Pleasant, relaxed, no gross deficits.   Intake/Output from previous day: 11/02 0701 - 11/03 0700 In: 330 [I.V.:300; Blood:30] Out: 850 [Urine:850]  Intake/Output this shift:    Lab Results:  Recent Labs  07/31/14 1715 07/31/14 1902 08/01/14 0353 08/01/14 1555  WBC 7.0 6.9 8.3  --   HGB 7.3* 7.0* 7.8* 8.2*  HCT 24.0* 22.3* 24.8* 25.7*  PLT 140* 135* 138*  --    BMET  Recent Labs  07/31/14 0445 08/01/14 0353  NA 137 140  K 3.7 3.9  CL 104 106  CO2 25 25  GLUCOSE 120* 125*  BUN 27* 24*  CREATININE 0.77 0.73  CALCIUM 8.6 9.0   LFT No results for input(s): PROT, ALBUMIN, AST, ALT, ALKPHOS, BILITOT, BILIDIR, IBILI in the last 72 hours. PT/INR  Recent Labs  08/01/14 0353 08/02/14 0438  LABPROT 20.3* 19.4*  INR 1.72* 1.62*     ASSESMENT:   *  Ongoing blood loss anemia in pt on coumadin following LVAD placement.   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  Was treated with laser and endoclip.  S/p 10n units of  PRBCs. Last transfusions on 11/1 - 11/2.   *  Chronic coumadin.  INR subtherapeutic.    PLAN   *  Observe for resolution of ABL anemia and bleeding.  CBC in AM.  Daily PPI as ulcer prophylaxis.     Azucena Freed  08/02/2014, 9:03 AM Pager: [phone]  Addendum: Patient seen, examined, and I agree with the above documentation, including the assessment and plan. Improved hemoglobin to 10.2, status post APC and Hemoclip of bleeding duodenal angiectasia Hopefully this has stopped bleeding, monitor hemoglobin tomorrow. If stable will likely be discharged per cardiology. Would target lower end of therapeutic range with Coumadin Jerene Bears, MD

## 2014-08-02 NOTE — Progress Notes (Signed)
Patient ID: ROC STREETT, male   DOB: 09-22-1941, 73 y.o.   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 Quit smoking 2003. Uses CPAP and O2 every night.   He is s/p LVAD HM II implanted 01/12/13 under DT criteria.   Admitted to [facility] 09/13/13 - 09/14/13 for increased fatigue and dyspnea. Found to have mild CHF and anemia. Diuresed with IV lasix and transitioned to PO lasix 40 mg twice a week. Discharge weight 203 lbs on inpatient scale. Admitting labs revealed Hgb 7.7 for which he received 2 units PCs with appropriate rise in Hgb.   Admit 6/8-6/12/15 symptomatic anemia. Had EGD/Colonoscopy which was unrevealing. 6 mm polyp clipped and removed. CT abdomen showed cirrhosis. Capsule study showed ? small bowel angioectasia that may have been source of slow GI blood loss.   Has received 9u PRBCs total.  Underwent push enteroscopy 10/28 with no source of bleeding found, however Hgb continued to trend down.  Underwent colonoscopy 10/29 - no source of bleeding.  Capsule Endoscopy showed bleeding form duodenum.  EGD- Bleeding noted duodenum clip applied.    Overall he has received 10 UPRBCs. Denies SOB/Orthopnea. Had one black stool.   HeartMate II LVAD: Flow: 4.8    Speed: 9200    Power: 5.2  PI:   6.1    Rare PI   Objective:    Vital Signs:   Temp:  [97.7 F (36.5 C)-98.5 F (36.9 C)] 98.1 F (36.7 C) (11/03 0400) Pulse Rate:  [59-70] 65 (11/03 0400) Resp:  [10-27] 18 (11/03 0400) SpO2:  [94 %-98 %] 97 % (11/03 0400) Weight:  [206 lb 3.2 oz (93.532 kg)-210 lb 12.8 oz (95.618 kg)] 206 lb 3.2 oz (93.532 kg) (11/03 0408) Last BM Date: 07/31/14 Mean arterial Pressure ~70-74  Intake/Output:   Intake/Output Summary (Last 24 hours) at 08/02/14 0644 Last data filed at 08/02/14 0306  Gross per 24 hour  Intake     330 ml  Output    850 ml  Net   -520 ml     Physical Exam:   General: Elderly,  NAD Sitting on the side of the bed Neck: No JVD, no thyromegaly or thyroid nodule.  Lungs: Clear to auscultation bilaterally with normal respiratory effort.  CV: LVAD hum present;  Abdomen: Soft, nontender, no hepatosplenomegaly, no distention. Driveline dressing C/D/I Skin: Intact without lesions or rashes.  Neurologic: Alert and oriented x 3.  Psych: Normal affect.  Extremities: No clubbing or cyanosis. No edema. HEENT: Normal.   Telemetry: NSR with PVCs NSVT  Labs: Basic Metabolic Panel:  Recent Labs Lab 07/27/14 0455 07/28/14 0331 07/29/14 0232 07/31/14 0445 08/01/14 0353 08/01/14 1540  NA 137 136* 140 137 140  --   K 3.9 3.9 3.7 3.7 3.9  --   CL 102 102 105 104 106  --   CO2 23 24 24 25 25   --   GLUCOSE 111* 118* 111* 120* 125*  --   BUN 23 21 20  27* 24*  --   CREATININE 0.79 0.72 0.82 0.77 0.73  --   CALCIUM 9.3 8.7 9.3 8.6 9.0  --   MG  --   --   --   --   --  1.9    Liver Function Tests: No results for input(s): AST, ALT, ALKPHOS, BILITOT, PROT, ALBUMIN  in the last 168 hours. No results for input(s): LIPASE, AMYLASE in the last 168 hours. No results for input(s): AMMONIA in the last 168 hours.  CBC:  Recent Labs Lab 07/30/14 0438 07/30/14 0950 07/31/14 1715 07/31/14 1902 08/01/14 0353 08/01/14 1555  WBC 8.3 9.4 7.0 6.9 8.3  --   HGB 8.8* 9.5* 7.3* 7.0* 7.8* 8.2*  HCT 28.3* 30.5* 24.0* 22.3* 24.8* 25.7*  MCV 106.4* 105.5* 105.7* 105.7* 103.3*  --   PLT 142* 150 140* 135* 138*  --     INR:  Recent Labs Lab 07/29/14 0232 07/30/14 0438 07/31/14 0445 08/01/14 0353 08/02/14 0438  INR 1.77* 1.52* 1.67* 1.72* 1.62*    Other results:    Imaging: No results found.   Medications:     Scheduled Medications: . sodium chloride   Intravenous Once  . sodium chloride   Intravenous Once  . sodium chloride   Intravenous Once  . sodium chloride   Intravenous  Once  . atorvastatin  40 mg Oral q1800  . budesonide-formoterol  2 puff Inhalation BID  . cholecalciferol  2,000 Units Oral QHS  . citalopram  40 mg Oral QHS  . docusate sodium  100 mg Oral q morning - 10a  . docusate sodium  200 mg Oral QHS  . levothyroxine  50 mcg Oral QHS  . losartan  25 mg Oral Daily  . pantoprazole  40 mg Oral BID  . sodium chloride  3 mL Intravenous Q12H  . tiotropium  18 mcg Inhalation Daily  . cyanocobalamin  1,000 mcg Oral QHS  . vitamin C  500 mg Oral BID  . Warfarin - Pharmacist Dosing Inpatient   Does not apply q1800    Infusions: . sodium chloride    . sodium chloride    . sodium chloride      PRN Medications: sodium chloride, albuterol, fentaNYL, meperidine (DEMEROL) injection, nitroGLYCERIN, promethazine, sodium chloride   Assessment/plan:   73 yo with CAD s/p CABG and ischemic cardiomyopathy s/p Heartmate II LVAD was admitted with increased exertional dyspnea. He was found to be anemic.  1. Exertional dyspnea: Improved with trasnfusion last pm.    2. Anemia:  CBC pending. Received total of 10 UPRBCSs.   - He will remain off ASA but will have to stay on coumadin, goal INR 1.8-2.5.  - Push enteroscopy 10/28 negative - Colonoscopy 10/29 without source of bleeding. - Result of capsule endoscopy--> Showed bleeding from duodenum.  -EGD--->Bleeding from duodenum with clip applied.  - Normal LDH makes hemolysis unlikely, will repeat LDH in am.  - INR 1.6.  Will hold off heparin gtt given concern for recent GI bleed. Pharmacy dosing.  3. Chronic systolic CHF: Ischemic CMP, has Heartmate II LVAD.  - Volume status stable and VAD parameters at baseline. Weight down 5 pounds. No diuretics today. His wife is changing the dressing weekly   MAPs stable.  4. PAF 5. CAD s/p CABG 6. HTN   D/C 24 hours Length of Stay: 10  CLEGG,AMY NP-C  08/02/2014, 6:44 AM  VAD Team --- VAD ISSUES ONLY--- Pager [phone] (7am - 7am)  Advanced Heart Failure Team   Pager [phone] (M-F; 7a - 4p)  Please contact [facility] for night-coverage after hours (4p -7a ) and weekends on [email or web address]   Patient seen and examined with [name], NP. We discussed all aspects of the encounter. I agree with the assessment and plan as stated above. Doing much better. Bleeding source identified and addressed. No evidence  of active bleeding. Can go home tomorrow if hgb stable and INR >= 1.8. Will give coumadin 7.5 tonight.VAD parameters stable.   Leonell Lobdell,MD 3:59 PM

## 2014-08-02 NOTE — Progress Notes (Signed)
ANTICOAGULATION CONSULT NOTE   Pharmacy Consult for coumadin Indication: LVAD  No Known Allergies  Patient Measurements: Height: 6' (182.9 cm) Weight: 206 lb 3.2 oz (93.532 kg) IBW/kg (Calculated) : 77.6   Vital Signs: Temp: 98.1 F (36.7 C) (11/03 0400) Temp Source: Oral (11/03 0400) Pulse Rate: 65 (11/03 0400)  Labs:  Recent Labs  07/31/14 0445 07/31/14 1715 07/31/14 1902 08/01/14 0353 08/01/14 1555 08/02/14 0438  HGB  --  7.3* 7.0* 7.8* 8.2*  --   HCT  --  24.0* 22.3* 24.8* 25.7*  --   PLT  --  140* 135* 138*  --   --   LABPROT 19.8*  --   --  20.3*  --  19.4*  INR 1.67*  --   --  1.72*  --  1.62*  CREATININE 0.77  --   --  0.73  --   --     Estimated Creatinine Clearance: 99.2 mL/min (by C-G formula based on Cr of 0.73).   Assessment: 73 yo man continuing coumadin for LVAD.   His INR remains below goal range (1.6) after duodenal clipping and low dose coumadin last night.  CBC pending - no overt bleeding noted this morning.  Plan to keep INR on low end. No heparin bridge per HF team.   Goal of Therapy:  INR 1.8-2.5 Monitor platelets by anticoagulation protocol: Yes   Plan: Warfarin 6 mg tonight Follow for any further s/s of bleeding  Thanks for allowing pharmacy to be a part of this patient's care.  Erin Hearing PharmD., BCPS Clinical Pharmacist Pager [phone] 08/02/2014 7:22 AM

## 2014-08-02 NOTE — Progress Notes (Signed)
Pt ambulating around unit with Cardiac Rehab. 8 and 4 beat run of VT noted on monitor during walk. Pt asymptomatic. No events noted on VAD monitor. MAP WNL after walk. Will continue to monitor pt closely.

## 2014-08-02 NOTE — Progress Notes (Signed)
CARDIAC REHAB PHASE I   PRE:  Rate/Rhythm: 68 SR  BP:  Supine:   Sitting: 76 map  Standing:    SaO2: 95%RA  MODE:  Ambulation: 470 ft   POST:  Rate/Rhythm: 85 SR 8 beats VT  BP:  Supine:   Sitting: 98 map  Standing:    SaO2: 93%RA 0950-1020 Pt walked 470 ft on RA with asst x 1. Pt stopped once to rest. Pt wanted to walk as much as he could. Tolerated well during walk but RN informed me that pt had ectopy during walk. 8 beats and 4 beats VT seen on strip when I looked. To sitting on side of bed after walk.    Graylon Good, RN BSN  08/02/2014 10:17 AM

## 2014-08-03 LAB — BASIC METABOLIC PANEL
Anion gap: 11 (ref 5–15)
BUN: 17 mg/dL (ref 6–23)
CHLORIDE: 103 meq/L (ref 96–112)
CO2: 23 meq/L (ref 19–32)
Calcium: 9 mg/dL (ref 8.4–10.5)
Creatinine, Ser: 0.75 mg/dL (ref 0.50–1.35)
GFR calc Af Amer: >90 mL/min (ref >90)
GFR, EST NON AFRICAN AMERICAN: 89 mL/min — AB (ref >90)
GLUCOSE: 148 mg/dL — AB (ref 70–99)
POTASSIUM: 3.8 meq/L (ref 3.7–5.3)
SODIUM: 137 meq/L (ref 137–147)

## 2014-08-03 LAB — CBC
HCT: 30.4 % — ABNORMAL LOW (ref 39.0–52.0)
Hemoglobin: 9.5 g/dL — ABNORMAL LOW (ref 13.0–17.0)
MCH: 31 pg (ref 26.0–34.0)
MCHC: 31.3 g/dL (ref 30.0–36.0)
MCV: 99.3 fL (ref 78.0–100.0)
Platelets: 141 10*3/uL — ABNORMAL LOW (ref 150–400)
RBC: 3.06 MIL/uL — AB (ref 4.22–5.81)
RDW: 22.9 % — ABNORMAL HIGH (ref 11.5–15.5)
WBC: 8 10*3/uL (ref 4.0–10.5)

## 2014-08-03 LAB — PROTIME-INR
INR: 1.58 — AB (ref 0.00–1.49)
PROTHROMBIN TIME: 19 s — AB (ref 11.6–15.2)

## 2014-08-03 LAB — HEPARIN LEVEL (UNFRACTIONATED)

## 2014-08-03 MED ORDER — HEPARIN (PORCINE) IN NACL 100-0.45 UNIT/ML-% IJ SOLN
1300.0000 [IU]/h | INTRAMUSCULAR | Status: DC
  Administered 2014-08-03: 950 [IU]/h via INTRAVENOUS
  Filled 2014-08-03 (×2): qty 250

## 2014-08-03 MED ORDER — WARFARIN SODIUM 7.5 MG PO TABS
7.5000 mg | ORAL_TABLET | Freq: Once | ORAL | Status: AC
  Administered 2014-08-03: 7.5 mg via ORAL
  Filled 2014-08-03: qty 1

## 2014-08-03 NOTE — Progress Notes (Signed)
08/03/2014 5:08 PM Nursing note Pt. Experienced three separate short runs of Vtach during ambulation today. Each run lasting 14, 7, and 16 beats long at breakfast, lunch and dinnertime. Each run patient asymptomatic, ambulating and immediately returned to NSR 60-70. Dr. Kendrick Ranch paged and made aware of occurences. No new orders at this time, only to continue to monitor patient. Pt. Updated on plan of care. Will continue to monitor patient. Gabrielly Mccrystal, Arville Lime

## 2014-08-03 NOTE — Progress Notes (Signed)
ANTICOAGULATION CONSULT NOTE - Follow Up Consult  Pharmacy Consult for Heparin and Coumadin Indication: LVAD  No Known Allergies  Patient Measurements: Height: 6' (182.9 cm) Weight: 206 lb 2.1 oz (93.5 kg) IBW/kg (Calculated) : 77.6 Heparin Dosing Weight: 93.5kg  Vital Signs: Temp: 98.1 F (36.7 C) (11/04 0355) Temp Source: Oral (11/04 0355) Pulse Rate: 84 (11/04 0355)  Labs:  Recent Labs  08/01/14 0353 08/01/14 1555 08/02/14 0438 08/02/14 1035 08/03/14 0420  HGB 7.8* 8.2*  --  10.2* 9.5*  HCT 24.8* 25.7*  --  32.5* 30.4*  PLT 138*  --   --  146* 141*  LABPROT 20.3*  --  19.4*  --  19.0*  INR 1.72*  --  1.62*  --  1.58*  CREATININE 0.73  --   --   --  0.75    Estimated Creatinine Clearance: 99.2 mL/min (by C-G formula based on Cr of 0.75).  Assessment: 73yom on coumadin pta for LVAD (12/2012), admitted with symptomatic anemia and heme +. 10/30 colonoscopy negative for bleeding. 11/1 capsule endoscopy showed bleeding in proximal duodenum and was subsequently clipped on 11/2. Coumadin was only held for one day (10/29), but INR continues to be subtherapeutic. Patient does not want to do lovenox shots at home, so Dr. Haroldine Laws wants to start a heparin bridge today. Will aim for lower goal given recent bleeding. Hgb is stable - has received 10 units PRBCs thus far.   Goal of Therapy:  Heparin level 0.3-0.5 INR 1.8-2.5 Monitor platelets by anticoagulation protocol: Yes   Plan:  1) Begin heparin at 950 units/hr with no bolus 2) Check 8 hour heparin level 3) Repeat coumadin 7.5mg  x 1 4) Daily INR, heparin level, CBC  Deboraha Sprang 08/03/2014,1:23 PM

## 2014-08-03 NOTE — Progress Notes (Signed)
Patient ID: [name], male   DOB: 1941/04/17, 73 y.o.   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 Quit smoking 2003. Uses CPAP and O2 every night.   He is s/p LVAD HM II implanted 01/12/13 under DT criteria.   Admitted to [facility] 09/13/13 - 09/14/13 for increased fatigue and dyspnea. Found to have mild CHF and anemia. Diuresed with IV lasix and transitioned to PO lasix 40 mg twice a week. Discharge weight 203 lbs on inpatient scale. Admitting labs revealed Hgb 7.7 for which he received 2 units PCs with appropriate rise in Hgb.   Admit 6/8-6/12/15 symptomatic anemia. Had EGD/Colonoscopy which was unrevealing. 6 mm polyp clipped and removed. CT abdomen showed cirrhosis. Capsule study showed ? small bowel angioectasia that may have been source of slow GI blood loss.   Has received 9u PRBCs total.  Underwent push enteroscopy 10/28 with no source of bleeding found, however Hgb continued to trend down.  Underwent colonoscopy 10/29 - no source of bleeding.  Capsule Endoscopy showed bleeding form duodenum.  EGD- Bleeding noted duodenum clip applied.    Overall he has received 10 UPRBCs. Denies SOB/Orthopnea. Had one black stool this am    Hgh 10.2>9,5 HeartMate II LVAD: Flow: 4.7    Speed: 9200    Power: 5.3  PI:   5.7   Objective:    Vital Signs:   Temp:  [98 F (36.7 C)-98.1 F (36.7 C)] 98.1 F (36.7 C) (11/04 0355) Pulse Rate:  [63-90] 84 (11/04 0355) Resp:  [18] 18 (11/04 0355) SpO2:  [97 %-98 %] 98 % (11/04 0355) Weight:  [206 lb 2.1 oz (93.5 kg)] 206 lb 2.1 oz (93.5 kg) (11/04 0341) Last BM Date: 08/01/14 Mean arterial Pressure ~70-78  Intake/Output:   Intake/Output Summary (Last 24 hours) at 08/03/14 0644 Last data filed at 08/03/14 0340  Gross per 24 hour  Intake    240 ml  Output    625 ml   Net   -385 ml     Physical Exam:   General: Elderly,  NAD Sitting on the side of the bed Neck: No JVD, no thyromegaly or thyroid nodule.  Lungs: Clear to auscultation bilaterally with normal respiratory effort.  CV: LVAD hum present;  Abdomen: Soft, nontender, no hepatosplenomegaly, no distention. Driveline dressing C/D/I Skin: Intact without lesions or rashes.  Neurologic: Alert and oriented x 3.  Psych: Normal affect.  Extremities: No clubbing or cyanosis. No edema. HEENT: Normal.   Telemetry: NSR with PVCs NSVT  Labs: Basic Metabolic Panel:  Recent Labs Lab 07/28/14 0331 07/29/14 0232 07/31/14 0445 08/01/14 0353 08/01/14 1540 08/03/14 0420  NA 136* 140 137 140  --  137  K 3.9 3.7 3.7 3.9  --  3.8  CL 102 105 104 106  --  103  CO2 24 24 25 25   --  23  GLUCOSE 118* 111* 120* 125*  --  148*  BUN 21 20 27* 24*  --  17  CREATININE 0.72 0.82 0.77 0.73  --  0.75  CALCIUM 8.7 9.3 8.6 9.0  --  9.0  MG  --   --   --   --  1.9  --     Liver Function Tests: No results for input(s): AST, ALT, ALKPHOS, BILITOT, PROT, ALBUMIN in the last 168 hours.  No results for input(s): LIPASE, AMYLASE in the last 168 hours. No results for input(s): AMMONIA in the last 168 hours.  CBC:  Recent Labs Lab 07/31/14 1715 07/31/14 1902 08/01/14 0353 08/01/14 1555 08/02/14 1035 08/03/14 0420  WBC 7.0 6.9 8.3  --  9.7 8.0  HGB 7.3* 7.0* 7.8* 8.2* 10.2* 9.5*  HCT 24.0* 22.3* 24.8* 25.7* 32.5* 30.4*  MCV 105.7* 105.7* 103.3*  --  98.5 99.3  PLT 140* 135* 138*  --  146* 141*    INR:  Recent Labs Lab 07/30/14 0438 07/31/14 0445 08/01/14 0353 08/02/14 0438 08/03/14 0420  INR 1.52* 1.67* 1.72* 1.62* 1.58*    Other results:    Imaging: No results found.   Medications:     Scheduled Medications: . sodium chloride   Intravenous Once  . atorvastatin  40 mg Oral q1800  . budesonide-formoterol  2 puff Inhalation BID  . cholecalciferol  2,000 Units Oral QHS  . citalopram   40 mg Oral QHS  . docusate sodium  100 mg Oral q morning - 10a  . docusate sodium  200 mg Oral QHS  . levothyroxine  50 mcg Oral QHS  . losartan  25 mg Oral Daily  . pantoprazole  40 mg Oral Daily  . sodium chloride  3 mL Intravenous Q12H  . tiotropium  18 mcg Inhalation Daily  . cyanocobalamin  1,000 mcg Oral QHS  . vitamin C  500 mg Oral BID  . Warfarin - Pharmacist Dosing Inpatient   Does not apply q1800    Infusions: . sodium chloride      PRN Medications: sodium chloride, albuterol, fentaNYL, meperidine (DEMEROL) injection, nitroGLYCERIN, promethazine, sodium chloride   Assessment/plan:   73 yo with CAD s/p CABG and ischemic cardiomyopathy s/p Heartmate II LVAD was admitted with increased exertional dyspnea. He was found to be anemic.  1. Exertional dyspnea: Resolved.     2. Anemia:  CBC pending. Received total of 10 UPRBCSs.   - He will remain off ASA but will have to stay on coumadin, goal INR 1.8-2.5. Off iron for now due to bleed.  - Push enteroscopy 10/28 negative - Colonoscopy 10/29 without source of bleeding. - Result of capsule endoscopy--> Showed bleeding from duodenum.  -EGD--->Bleeding from duodenum with clip applied.  - Normal LDH makes hemolysis unlikely, will repeat LDH in am.  - INR 1.58.  Will start low-dose heparin gtt. Pharmacy dosing.  3. Chronic systolic CHF: Ischemic CMP, has Heartmate II LVAD.  - Volume status stable and VAD parameters at baseline. Weight unchanged. No diuretics today. His wife is changing the dressing weekly   MAPs stable.  4. PAF 5. CAD s/p CABG 6. HTN   D/C 24 hours Length of Stay: 11  CLEGG,AMY NP-C  08/03/2014, 6:44 AM  VAD Team --- VAD ISSUES ONLY--- Pager [phone] (7am - 7am)  Advanced Heart Failure Team  Pager [phone] (M-F; 7a - 4p)  Please contact [facility] for night-coverage after hours (4p -7a ) and weekends on [email or web address]   Patient seen and examined with [name], NP. We discussed all aspects of the  encounter. I agree with the assessment and plan as stated above.   He is doing well. Bleeding seems to have resolved but INR now low. Would start low dose heparin (no bolus) and continue coumadin. Home when INR >= 1.8 and hgb stable. VAD parameters ok.   [name] Varma,MD 9:35 AM

## 2014-08-03 NOTE — Progress Notes (Signed)
ANTICOAGULATION CONSULT NOTE - Follow Up Consult  Pharmacy Consult for Heparin Indication: LVAD  No Known Allergies  Patient Measurements: Height: 6' (182.9 cm) Weight: 206 lb 2.1 oz (93.5 kg) IBW/kg (Calculated) : 77.6 Heparin Dosing Weight: 93.5kg  Vital Signs: Temp: 98.2 F (36.8 C) (11/04 1950) Temp Source: Oral (11/04 1950) Pulse Rate: 78 (11/04 2012)  Labs:  Recent Labs  08/01/14 0353 08/01/14 1555 08/02/14 0438 08/02/14 1035 08/03/14 0420 08/03/14 1920  HGB 7.8* 8.2*  --  10.2* 9.5*  --   HCT 24.8* 25.7*  --  32.5* 30.4*  --   PLT 138*  --   --  146* 141*  --   LABPROT 20.3*  --  19.4*  --  19.0*  --   INR 1.72*  --  1.62*  --  1.58*  --   HEPARINUNFRC  --   --   --   --   --  <0.10*  CREATININE 0.73  --   --   --  0.75  --     Estimated Creatinine Clearance: 99.2 mL/min (by C-G formula based on Cr of 0.75).  Assessment: 73yom on coumadin pta for LVAD (12/2012), admitted with symptomatic anemia and heme +. 10/30 colonoscopy negative for bleeding. 11/1 capsule endoscopy showed bleeding in proximal duodenum and was subsequently clipped on 11/2. Coumadin was only held for one day (10/29), but INR continues to be subtherapeutic. Patient does not want to do lovenox shots at home, so Dr. Haroldine Laws wants to start a heparin bridge today. Will aim for lower goal given recent bleeding. Hgb is stable - has received 10 units PRBCs thus far.   PM heparin level < 0.10  Goal of Therapy:  Heparin level 0.3-0.5 INR 1.8-2.5 Monitor platelets by anticoagulation protocol: Yes   Plan:  1) Increase heparin to 1150 units / hr 2) Follow up AM heparin level  Thank you. Anette Guarneri, PharmD [phone] 08/03/2014,8:32 PM

## 2014-08-03 NOTE — Progress Notes (Signed)
          Daily Rounding Note  08/03/2014, 8:28 AM  LOS: 11 days   SUBJECTIVE:       Looks well, feels great.   OBJECTIVE:         Vital signs in last 24 hours:    Temp:  [98 F (36.7 C)-98.1 F (36.7 C)] 98.1 F (36.7 C) (11/04 0355) Pulse Rate:  [70-90] 84 (11/04 0355) Resp:  [18] 18 (11/04 0355) SpO2:  [97 %-98 %] 98 % (11/04 0355) Weight:  [206 lb 2.1 oz (93.5 kg)] 206 lb 2.1 oz (93.5 kg) (11/04 0341) Last BM Date: 08/01/14 Filed Weights   08/01/14 1100 08/02/14 0408 08/03/14 0341  Weight: 210 lb 12.8 oz (95.618 kg) 206 lb 3.2 oz (93.532 kg) 206 lb 2.1 oz (93.5 kg)   General: comfortable and looks well   Heart: LVAD hum Chest: clear bil.   Abdomen: soft, NT, ND, drive line site benign  Extremities: no edema.   Neuro/Psych:  Pleasant, in good spirits.    Intake/Output from previous day: 11/03 0701 - 11/04 0700 In: 240 [P.O.:240] Out: 625 [Urine:625]  Intake/Output this shift:    Lab Results:  Recent Labs  08/01/14 0353 08/01/14 1555 08/02/14 1035 08/03/14 0420  WBC 8.3  --  9.7 8.0  HGB 7.8* 8.2* 10.2* 9.5*  HCT 24.8* 25.7* 32.5* 30.4*  PLT 138*  --  146* 141*   BMET  Recent Labs  08/01/14 0353 08/03/14 0420  NA 140 137  K 3.9 3.8  CL 106 103  CO2 25 23  GLUCOSE 125* 148*  BUN 24* 17  CREATININE 0.73 0.75  CALCIUM 9.0 9.0   LFT No results for input(s): PROT, ALBUMIN, AST, ALT, ALKPHOS, BILITOT, BILIDIR, IBILI in the last 72 hours. PT/INR  Recent Labs  08/02/14 0438 08/03/14 0420  LABPROT 19.4* 19.0*  INR 1.62* 1.58*   Hepatitis Panel No results for input(s): HEPBSAG, HCVAB, HEPAIGM, HEPBIGM in the last 72 hours.  Studies/Results: No results found.  ASSESMENT:   * Ongoing blood loss anemia in pt on coumadin following LVAD placement.  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 Was treated with laser and  endoclip.  S/p 10 units of PRBCs. Last transfusions on 11/1 - 11/2. Hgb down 0.7 grams last 24 hours  * Chronic coumadin. INR subtherapeutic.     PLAN   *  Follow hgb tomorrow.      Azucena Freed  08/03/2014, 8:28 AM Pager: [phone]

## 2014-08-03 NOTE — Progress Notes (Signed)
0950 Pt observed walking in halls independently. Gait steady. Will continue to follow. Graylon Good RN BSN 08/03/2014 9:48 AM

## 2014-08-04 LAB — CBC
HCT: 30.4 % — ABNORMAL LOW (ref 39.0–52.0)
Hemoglobin: 9.6 g/dL — ABNORMAL LOW (ref 13.0–17.0)
MCH: 31.3 pg (ref 26.0–34.0)
MCHC: 31.6 g/dL (ref 30.0–36.0)
MCV: 99 fL (ref 78.0–100.0)
PLATELETS: 144 10*3/uL — AB (ref 150–400)
RBC: 3.07 MIL/uL — AB (ref 4.22–5.81)
RDW: 21.3 % — ABNORMAL HIGH (ref 11.5–15.5)
WBC: 6.7 10*3/uL (ref 4.0–10.5)

## 2014-08-04 LAB — PROTIME-INR
INR: 1.78 — AB (ref 0.00–1.49)
PROTHROMBIN TIME: 20.9 s — AB (ref 11.6–15.2)

## 2014-08-04 LAB — HEPARIN LEVEL (UNFRACTIONATED): HEPARIN UNFRACTIONATED: 0.22 [IU]/mL — AB (ref 0.30–0.70)

## 2014-08-04 MED ORDER — WARFARIN SODIUM 5 MG PO TABS: 5.0000 mg | ORAL_TABLET | Freq: Every day | ORAL | Status: DC

## 2014-08-04 MED ORDER — ASPIRIN EC 81 MG PO TBEC: 81.0000 mg | DELAYED_RELEASE_TABLET | Freq: Every day | ORAL | Status: DC

## 2014-08-04 MED ORDER — LOSARTAN POTASSIUM 50 MG PO TABS: 25.0000 mg | ORAL_TABLET | Freq: Every day | ORAL | Status: DC

## 2014-08-04 NOTE — Discharge Planning (Signed)
Advanced Heart Failure Team  Discharge Summary   Patient ID: [name] MRN: [id number], DOB/AGE: 1941/04/27 73 y.o. Admit date: 07/23/2014 D/C date:     08/04/2014   Primary Discharge Diagnoses:  1. Exertional dyspnea: Resolved.  2. Anemia: Received total of 10 UPRBCSs. Discharge Hemoglobin 9.6  - He will remain off ASA but will have to stay on coumadin, goal INR 1.8-2.5.  - Push enteroscopy 10/28 negative - Colonoscopy 10/29 without source of bleeding. - Result of capsule endoscopy--> Showed bleeding from duodenum.  -EGD--->Bleeding from duodenum with clip applied.  -Restart iron today  - INR 1.78 on discharge 1.78  3. 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 PAF 5. CAD s/p CABG 6. 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 Quit smoking 2003. Uses CPAP and O2 every night. He is s/p LVAD HM II implanted 01/12/13 under DT criteria.   He was admitted with increased dyspnea that was related to his anemia with acute blood loss. Hemoglobin on admit was 8.2. GI was consulted and he and extensive work up as noted below. Over the course of his hospitalization he received 10 UPRBCs. Hemoglobin on discharge was 9.6. He will continue Protonix and stool softners. He did require heparin bridge. Coumadin was adjusted with goal for his INR remaining 1.8-2.5.   From HF perspective he remained stable and will continue diuretics as needed. Losartan was cut back to 25 mg daily.  He will continue to be followed closely in the HF clinic with follow up next Tuesday. Plan to check INR at that time.   GI Studies Push enteroscopy 10/28 negative Colonoscopy 10/29 without source of bleeding. Result of capsule endoscopy--> Showed bleeding from duodenum.  EGD--->Bleeding from duodenum with clip applied.   Discharge  Weight Range: 207 pounds.  Discharge Vitals: Blood pressure 86/0, pulse 74, temperature 97.9 F (36.6 C), temperature source Oral, resp. rate 18, height 6' (1.829 m), weight 207 lb 7.3 oz (94.1 kg), SpO2 98 %.  Labs: Lab Results  Component Value Date   WBC 6.7 08/04/2014   HGB 9.6* 08/04/2014   HCT 30.4* 08/04/2014   MCV 99.0 08/04/2014   PLT 144* 08/04/2014    Recent Labs Lab 08/03/14 0420  NA 137  K 3.8  CL 103  CO2 23  BUN 17  CREATININE 0.75  CALCIUM 9.0  GLUCOSE 148*   Lab Results  Component Value Date   CHOL 126 07/02/2012   HDL 40.80 07/02/2012   LDLCALC 58 07/02/2012   TRIG 134.0 07/02/2012   BNP (last 3 results)  Recent Labs  05/17/14 0834 07/19/14 0830 07/23/14 1410  PROBNP 504.4* 296.4* 241.4*    Diagnostic Studies/Procedures   No results found.  Discharge Medications     Medication List    TAKE these medications        albuterol (2.5 MG/3ML) 0.083% nebulizer solution  Commonly known as:  PROVENTIL  Take 2.5 mg by nebulization every 6 (six) hours as needed for wheezing or shortness of breath.     budesonide-formoterol 160-4.5 MCG/ACT inhaler  Commonly known as:  SYMBICORT  Inhale 2 puffs into the lungs 2 (two) times daily.     cholecalciferol 1000 UNITS tablet  Commonly known as:  VITAMIN D  Take 2,000 Units by mouth at bedtime.     citalopram 40 MG  tablet  Commonly known as:  CELEXA  Take 40 mg by mouth at bedtime.     cyanocobalamin 1000 MCG tablet  Take 1,000 mcg by mouth at bedtime.     docusate sodium 100 MG capsule  Commonly known as:  COLACE  Take 200 mg by mouth at bedtime.     ferrous gluconate 324 MG tablet  Commonly known as:  FERGON  Take 324 mg by mouth 3 (three) times daily with meals.     furosemide 40 MG tablet  Commonly known as:  LASIX  Take 40 mg by mouth daily as needed (fluid retention).     levothyroxine 100 MCG tablet  Commonly known as:  SYNTHROID, LEVOTHROID  Take 50 mcg by mouth at bedtime.      losartan 50 MG tablet  Commonly known as:  COZAAR  Take 0.5 tablets (25 mg total) by mouth daily.     MULTIVITAMIN PO  Take 1 tablet by mouth daily.     nitroGLYCERIN 0.4 MG SL tablet  Commonly known as:  NITROSTAT  Place 0.4 mg under the tongue every 5 (five) minutes as needed for chest pain.     pantoprazole 40 MG tablet  Commonly known as:  PROTONIX  Take 1 tablet (40 mg total) by mouth 2 (two) times daily.     potassium chloride 10 MEQ tablet  Commonly known as:  K-DUR  Take 10 mEq by mouth daily as needed (when taking Furosemide).     simvastatin 80 MG tablet  Commonly known as:  ZOCOR  Take 40 mg by mouth at bedtime.     tiotropium 18 MCG inhalation capsule  Commonly known as:  SPIRIVA  Place 18 mcg into inhaler and inhale daily.     vitamin C 500 MG tablet  Commonly known as:  ASCORBIC ACID  Take 500 mg by mouth 2 (two) times daily.     warfarin 5 MG tablet  Commonly known as:  COUMADIN  Take 1-1.5 tablets (5-7.5 mg total) by mouth daily. 7.5mg  on  Monday-Wednesday and Friday. 5mg  all other days        Disposition   The patient will be discharged in stable condition to home.     Discharge Instructions    ACE Inhibitor / ARB already ordered    Complete by:  As directed      Diet - low sodium heart healthy    Complete by:  As directed      Increase activity slowly    Complete by:  As directed           Follow-up Information    Follow up with [facility] On 08/09/2014.   Why:  at 10:00 Garage Code 5000        Duration of Discharge Encounter: Greater than 35 minutes   Signed, CLEGG,AMY  NP-C  08/04/2014, 4:05 PM  Patient seen and examined with [name], NP. We discussed all aspects of the encounter. I agree with the assessment and plan as stated above. No further bleeding. He is ready for d/c. F/u in VAD Clinic.  Daniel Bensimhon,MD 1:33 PM

## 2014-08-04 NOTE — Progress Notes (Signed)
Patient ID: [name], male   DOB: [birth year], 73 y.o.   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 Quit smoking 2003. Uses CPAP and O2 every night.   He is s/p LVAD HM II implanted 01/12/13 under DT criteria.   Admitted to [facility] 09/13/13 - 09/14/13 for increased fatigue and dyspnea. Found to have mild CHF and anemia. Diuresed with IV lasix and transitioned to PO lasix 40 mg twice a week. Discharge weight 203 lbs on inpatient scale. Admitting labs revealed Hgb 7.7 for which he received 2 units PCs with appropriate rise in Hgb.   Admit 6/8-6/12/15 symptomatic anemia. Had EGD/Colonoscopy which was unrevealing. 6 mm polyp clipped and removed. CT abdomen showed cirrhosis. Capsule study showed ? small bowel angioectasia that may have been source of slow GI blood loss.   Has received 9u PRBCs total.  Underwent push enteroscopy 10/28 with no source of bleeding found, however Hgb continued to trend down.  Underwent colonoscopy 10/29 - no source of bleeding.  Capsule Endoscopy showed bleeding form duodenum.  EGD- Bleeding noted duodenum clip applied.    Overall he has received 10 UPRBCs. Denies SOB/Orthopnea. Had 2 BMs yesterday. 2nd BM black/brown.   Denies SOB   Hgh 10.2>9,5>9.6 HeartMate II LVAD: Flow: 5.3    Speed: 9200    Power: 5.3  PI:   5.6   Objective:    Vital Signs:   Temp:  [97.9 F (36.6 C)-98.2 F (36.8 C)] 97.9 F (36.6 C) (11/05 0400) Pulse Rate:  [68-78] 74 (11/05 0400) Resp:  [16-18] 18 (11/05 0400) SpO2:  [97 %-98 %] 98 % (11/05 0400) Weight:  [207 lb 7.3 oz (94.1 kg)] 207 lb 7.3 oz (94.1 kg) (11/05 0354) Last BM Date: 08/03/14 Mean arterial Pressure ~70-78  Intake/Output:   Intake/Output Summary (Last 24 hours) at 08/04/14 0655 Last data filed at 08/03/14 2300  Gross per 24 hour   Intake    240 ml  Output    300 ml  Net    -60 ml     Physical Exam:   General: Elderly,  NAD Sitting on the side of the bed Neck: No JVD, no thyromegaly or thyroid nodule.  Lungs: Clear to auscultation bilaterally with normal respiratory effort.  CV: LVAD hum present;  Abdomen: Soft, nontender, no hepatosplenomegaly, no distention. Driveline dressing C/D/I Skin: Intact without lesions or rashes.  Neurologic: Alert and oriented x 3.  Psych: Normal affect.  Extremities: No clubbing or cyanosis. No edema. HEENT: Normal.   Telemetry: NSR with PVCs NSVT  Labs: Basic Metabolic Panel:  Recent Labs Lab 07/29/14 0232 07/31/14 0445 08/01/14 0353 08/01/14 1540 08/03/14 0420  NA 140 137 140  --  137  K 3.7 3.7 3.9  --  3.8  CL 105 104 106  --  103  CO2 24 25 25   --  23  GLUCOSE 111* 120* 125*  --  148*  BUN 20 27* 24*  --  17  CREATININE 0.82 0.77 0.73  --  0.75  CALCIUM 9.3 8.6 9.0  --  9.0  MG  --   --   --  1.9  --     Liver Function Tests: No results for input(s): AST, ALT, ALKPHOS, BILITOT, PROT, ALBUMIN in the last 168 hours. No results for input(s): LIPASE, AMYLASE in the  last 168 hours. No results for input(s): AMMONIA in the last 168 hours.  CBC:  Recent Labs Lab 07/31/14 1902 08/01/14 0353 08/01/14 1555 08/02/14 1035 08/03/14 0420 08/04/14 0431  WBC 6.9 8.3  --  9.7 8.0 6.7  HGB 7.0* 7.8* 8.2* 10.2* 9.5* 9.6*  HCT 22.3* 24.8* 25.7* 32.5* 30.4* 30.4*  MCV 105.7* 103.3*  --  98.5 99.3 99.0  PLT 135* 138*  --  146* 141* 144*    INR:  Recent Labs Lab 07/31/14 0445 08/01/14 0353 08/02/14 0438 08/03/14 0420 08/04/14 0431  INR 1.67* 1.72* 1.62* 1.58* 1.78*    Other results:    Imaging: No results found.   Medications:     Scheduled Medications: . sodium chloride   Intravenous Once  . atorvastatin  40 mg Oral q1800  . budesonide-formoterol  2 puff Inhalation BID  . cholecalciferol  2,000 Units Oral QHS  . citalopram  40 mg Oral QHS   . docusate sodium  100 mg Oral q morning - 10a  . docusate sodium  200 mg Oral QHS  . levothyroxine  50 mcg Oral QHS  . losartan  25 mg Oral Daily  . pantoprazole  40 mg Oral Daily  . sodium chloride  3 mL Intravenous Q12H  . tiotropium  18 mcg Inhalation Daily  . cyanocobalamin  1,000 mcg Oral QHS  . vitamin C  500 mg Oral BID  . Warfarin - Pharmacist Dosing Inpatient   Does not apply q1800    Infusions: . sodium chloride    . heparin 1,300 Units/hr (08/04/14 0610)    PRN Medications: sodium chloride, albuterol, fentaNYL, meperidine (DEMEROL) injection, nitroGLYCERIN, promethazine, sodium chloride   Assessment/plan:   73 yo with CAD s/p CABG and ischemic cardiomyopathy s/p Heartmate II LVAD was admitted with increased exertional dyspnea. He was found to be anemic.  1. Exertional dyspnea: Resolved.     2. Anemia:  CBC pending. Received total of 10 UPRBCSs.  Hemoglobin 9.6 today  - He will remain off ASA but will have to stay on coumadin, goal INR 1.8-2.5. Off iron for now due to bleed.  - Push enteroscopy 10/28 negative - Colonoscopy 10/29 without source of bleeding. - Result of capsule endoscopy--> Showed bleeding from duodenum.  -EGD--->Bleeding from duodenum with clip applied.  -Restart iron today  - INR 1.78.   3. Chronic systolic CHF: Ischemic CMP, has Heartmate II LVAD.  - Volume status stable and VAD parameters at baseline. No diuretics today. His wife is changing the dressing weekly   MAPs stable.  4. PAF 5. CAD s/p CABG 6. HTN   D/C today. Follow up in HF clinic next Tuesday at 10:00 Length of Stay: 12  CLEGG,AMY NP-C  08/04/2014, 6:55 AM  VAD Team --- VAD ISSUES ONLY--- Pager [phone] ([place])  Advanced Heart Failure Team  Pager [phone] (M-F; 7a - 4p)  Please contact [facility] for night-coverage after hours (4p -7a ) and weekends on [email or web address]  Patient seen and examined with [name], NP. We discussed all aspects of the encounter. I agree with  the assessment and plan as stated above.   Hemoglobin stable. INR 1.8. Feels much better. Ambulating without problems. Can go home today. Will f/u in clinic early next week with labs. Keep off ASA for now. VAD  parameters stable.   Daniel Bensimhon,MD 7:59 AM

## 2014-08-04 NOTE — Discharge Instructions (Signed)

## 2014-08-04 NOTE — Progress Notes (Signed)
08/04/2014 10:22 AM Nursing note Discharge avs form, medications already taken today and those due this evening given and explained to patient and spouse. Follow up appointments and when to call MD reviewed. rx reviewed with patient. Questions and concerns addressed. D/c PIV. D/c tele. Verified pt. Had emergency bag on his person along with extra set charged batteries and system controller. Re-informed patient of need to complete daily self test upon arrival home. Verbalized understanding. D/c home with spouse on two fully charged batteries.  Allayah Raineri, Arville Lime

## 2014-08-04 NOTE — Progress Notes (Signed)
ANTICOAGULATION CONSULT NOTE - Follow Up Consult  Pharmacy Consult for Heparin Indication: LVAD  No Known Allergies  Patient Measurements: Height: 6' (182.9 cm) Weight: 207 lb 7.3 oz (94.1 kg) IBW/kg (Calculated) : 77.6 Heparin Dosing Weight: 93.5kg  Vital Signs: Temp: 97.9 F (36.6 C) (11/05 0400) Temp Source: Oral (11/05 0400) Pulse Rate: 74 (11/05 0400)  Labs:  Recent Labs  08/02/14 0438 08/02/14 1035 08/03/14 0420 08/03/14 1920 08/04/14 0431  HGB  --  10.2* 9.5*  --  9.6*  HCT  --  32.5* 30.4*  --  30.4*  PLT  --  146* 141*  --  144*  LABPROT 19.4*  --  19.0*  --  20.9*  INR 1.62*  --  1.58*  --  1.78*  HEPARINUNFRC  --   --   --  <0.10* 0.22*  CREATININE  --   --  0.75  --   --     Estimated Creatinine Clearance: 99.4 mL/min (by C-G formula based on Cr of 0.75).  Assessment: 73yom on coumadin pta for LVAD (12/2012), admitted with symptomatic anemia and heme +. 10/30 colonoscopy negative for bleeding. 11/1 capsule endoscopy showed bleeding in proximal duodenum and was subsequently clipped on 11/2. Coumadin was only held for one day (10/29), but INR continues to be subtherapeutic. Patient does not want to do lovenox shots at home, so Dr. Haroldine Laws wants to start a heparin bridge today. Will aim for lower goal given recent bleeding. Hgb is stable - has received 10 units PRBCs thus far.   HL this am is 0.22 on 1150 units/hr no further bleeding noted. H/h/plt stable inr 1.78   Goal of Therapy:  Heparin level 0.3-0.5 INR 1.8-2.5 Monitor platelets by anticoagulation protocol: Yes   Plan:  Increase Heparin infusion to 1300 units/hr and recheck HL in 6 hours  Curlene Dolphin

## 2014-08-04 NOTE — Plan of Care (Signed)
Problem: Discharge Progression Outcomes Goal: Tolerating diet Outcome: Completed/Met Date Met:  08/04/14     

## 2014-08-08 ENCOUNTER — Encounter: Payer: Self-pay | Admitting: Internal Medicine

## 2014-08-09 ENCOUNTER — Ambulatory Visit (HOSPITAL_COMMUNITY): Payer: Self-pay | Admitting: *Deleted

## 2014-08-09 ENCOUNTER — Ambulatory Visit (HOSPITAL_COMMUNITY)
Admission: RE | Admit: 2014-08-09 | Discharge: 2014-08-09 | Disposition: A | Discharge status: Home / Self Care | Payer: Medicare Other | Source: Ambulatory Visit | Attending: Cardiology | Admitting: Cardiology

## 2014-08-09 ENCOUNTER — Other Ambulatory Visit (HOSPITAL_COMMUNITY): Payer: Self-pay | Admitting: *Deleted

## 2014-08-09 VITALS — BP 80/0 | HR 65 | Ht 73.0 in | Wt 225.0 lb

## 2014-08-09 DIAGNOSIS — I480 Paroxysmal atrial fibrillation: Secondary | ICD-10-CM | POA: Diagnosis not present

## 2014-08-09 DIAGNOSIS — Z95811 Presence of heart assist device: Secondary | ICD-10-CM | POA: Diagnosis not present

## 2014-08-09 DIAGNOSIS — G4733 Obstructive sleep apnea (adult) (pediatric): Secondary | ICD-10-CM | POA: Insufficient documentation

## 2014-08-09 DIAGNOSIS — Z7901 Long term (current) use of anticoagulants: Secondary | ICD-10-CM

## 2014-08-09 DIAGNOSIS — D649 Anemia, unspecified: Secondary | ICD-10-CM | POA: Insufficient documentation

## 2014-08-09 DIAGNOSIS — F329 Major depressive disorder, single episode, unspecified: Secondary | ICD-10-CM | POA: Insufficient documentation

## 2014-08-09 DIAGNOSIS — I5022 Chronic systolic (congestive) heart failure: Secondary | ICD-10-CM | POA: Insufficient documentation

## 2014-08-09 DIAGNOSIS — I482 Chronic atrial fibrillation, unspecified: Secondary | ICD-10-CM

## 2014-08-09 LAB — BASIC METABOLIC PANEL
Anion gap: 11 (ref 5–15)
BUN: 13 mg/dL (ref 6–23)
CHLORIDE: 107 meq/L (ref 96–112)
CO2: 23 mEq/L (ref 19–32)
Calcium: 9.4 mg/dL (ref 8.4–10.5)
Creatinine, Ser: 0.78 mg/dL (ref 0.50–1.35)
GFR calc Af Amer: >90 mL/min (ref >90)
GFR, EST NON AFRICAN AMERICAN: 88 mL/min — AB (ref >90)
GLUCOSE: 110 mg/dL — AB (ref 70–99)
POTASSIUM: 4.1 meq/L (ref 3.7–5.3)
SODIUM: 141 meq/L (ref 137–147)

## 2014-08-09 LAB — PRO B NATRIURETIC PEPTIDE: PRO B NATRI PEPTIDE: 478.1 pg/mL — AB (ref 0–125)

## 2014-08-09 LAB — CBC
HCT: 35.6 % — ABNORMAL LOW (ref 39.0–52.0)
HEMOGLOBIN: 10.7 g/dL — AB (ref 13.0–17.0)
MCH: 29.7 pg (ref 26.0–34.0)
MCHC: 30.1 g/dL (ref 30.0–36.0)
MCV: 98.9 fL (ref 78.0–100.0)
Platelets: 165 10*3/uL (ref 150–400)
RBC: 3.6 MIL/uL — ABNORMAL LOW (ref 4.22–5.81)
RDW: 19.6 % — ABNORMAL HIGH (ref 11.5–15.5)
WBC: 5.8 10*3/uL (ref 4.0–10.5)

## 2014-08-09 LAB — PROTIME-INR
INR: 2.32 — ABNORMAL HIGH (ref 0.00–1.49)
Prothrombin Time: 25.7 seconds — ABNORMAL HIGH (ref 11.6–15.2)

## 2014-08-09 LAB — LACTATE DEHYDROGENASE: LDH: 290 U/L — AB (ref 94–250)

## 2014-08-09 NOTE — Progress Notes (Signed)
Patient ID: [name], male   DOB: 15-Jun-1941, 73 y.o.   MRN: [id number]   [facility] [place] (Dr. 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 Quit smoking 2003. Uses CPAP and O2 every night. He is s/p LVAD HM II implanted 01/12/13 under DT criteria  Admitted to [facility] 09/13/13 - 09/14/13 for increased fatigue and dyspnea. Found to have mild CHF and anemia. Diuresed with IV lasix and transitioned to PO lasix 40 mg twice a week. Discharge weight 203 lbs on inpatient scale.  Admitting labs revealed Hgb 7.7 for which he received 2 units PCs with appropriate rise in Hgb.   Admit 6/8-6/12 for symptomatic anemia. Had EGD/Colonoscopy which was unrevealing.  6 mm polyp clipped and removed. CT abdomen showed cirrhosis. Placed on heparin bridge. Capsule study pending. Sent home on lovenox. D/C weight 206 lbs.   Admitted to [facility] with wit GI bleed. GI consulted. Push enteroscopy 10/28 negative. Colonoscopy 10/29 without source of bleeding. Result of capsule endoscopy--> Showed bleeding from duodenum.  EGD--->Bleeding from duodenum with clip applied. Overall he received  10 UPRBCs. Discharge weight was 207 pounds.   He returns for follow up. Overall he feeling better. Denies SOB/PND/Orthopnea Has not required any lasix.  Weight at home 212-216 pounds. Denies BRBPR. Taking all medications. Not using CPAP.  He continues weekly dressing changes.    Denies LVAD alarms. Denies driveline trauma, erythema or drainage.  Denies ICD shocks. Reports taking Coumadin as prescribed and adherence to anticoagulation based dietary restrictions. Denies bright red blood per rectum or melena, no dark urine or hematuria.   Past Medical History  Diagnosis Date  . Ischemic cardiomyopathy      CABG 2003, PCI 2007  EF 27%(myoview 2012   . Chronic systolic heart failure   . Hyperlipidemia   . Hypothyroidism   . Chronic anticoagulation     Afib and LVAD  . Obesity   . COPD (chronic obstructive pulmonary disease)   . Asbestosis(501)     "6 years in the [place]" (05/24/2013)  . Atrial fibrillation     permanent  . Paroxysmal ventricular tachycardia   . Coronary artery disease   . Implantable cardioverter-defibrillator [employer]   . Hypertension   . Myocardial infarction 1990's-2000    "2 in  ~ the 1990's; 1 in ~ 2000" (05/24/2013)  . OSA on CPAP   . Depression   . LVAD (left ventricular assist device) present 12/2012    Current Outpatient Prescriptions  Medication Sig Dispense Refill  . albuterol (PROVENTIL) (2.5 MG/3ML) 0.083% nebulizer solution Take 2.5 mg by nebulization every 6 (six) hours as needed for wheezing or shortness of breath.     . budesonide-formoterol (SYMBICORT) 160-4.5 MCG/ACT inhaler Inhale 2 puffs into the lungs 2 (two) times daily.    . cholecalciferol (VITAMIN D) 1000 UNITS tablet Take 2,000 Units by mouth at bedtime.     . citalopram (CELEXA) 40 MG tablet Take 40 mg by mouth at bedtime.    . cyanocobalamin 1000 MCG tablet Take 1,000 mcg by mouth at bedtime.     . docusate sodium (COLACE) 100 MG capsule Take 200 mg by mouth 2 (two) times daily. Take one tab in am and two tabs hs    . ferrous gluconate (FERGON) 324 MG tablet Take 324 mg by mouth 3 (three)  times daily with meals.    [name] levothyroxine (SYNTHROID, LEVOTHROID) 100 MCG tablet Take 50 mcg by mouth at bedtime.     [name] losartan (COZAAR) 50 MG tablet Take 0.5 tablets (25 mg total) by mouth daily. 90 tablet 3  . Multiple Vitamins-Minerals (MULTIVITAMIN PO) Take 1 tablet by mouth daily.    . simvastatin (ZOCOR) 80 MG tablet Take 40 mg by mouth at bedtime.      [name] tiotropium (SPIRIVA) 18 MCG inhalation capsule Place 18 mcg into inhaler and inhale daily.     . vitamin C (ASCORBIC ACID) 500 MG tablet Take 500 mg by mouth 2 (two) times daily.     [name]  warfarin (COUMADIN) 5 MG tablet Take 1-1.5 tablets (5-7.5 mg total) by mouth daily. 7.5mg  on  Monday-Wednesday and Friday. 5mg  all other days 60 tablet 6  . furosemide (LASIX) 40 MG tablet Take 40 mg by mouth daily as needed (fluid retention).    . nitroGLYCERIN (NITROSTAT) 0.4 MG SL tablet Place 0.4 mg under the tongue every 5 (five) minutes as needed for chest pain.     . pantoprazole (PROTONIX) 40 MG tablet Take 1 tablet (40 mg total) by mouth 2 (two) times daily. 60 tablet 3  . potassium chloride (K-DUR) 10 MEQ tablet Take 10 mEq by mouth daily as needed (when taking Furosemide).     No current facility-administered medications for this encounter.    Review of patient's allergies indicates no known allergies.  REVIEW OF SYSTEMS: All systems negative except as listed in HPI, PMH and Problem list.   LVAD interrogation reveals:  Speed: 9200 Flow:  5.2 Power:  5.6 PI:  5.7  Alarms:  No alarms Events:  Rare PI event Fixed speed:  9200 Low speed limit: 8600  I reviewed the LVAD parameters from today, and compared the results to the patient's prior recorded data.  No programming changes were made.  The LVAD is functioning within specified parameters.  The patient performs LVAD self-test daily.  LVAD interrogation was negative for any significant power changes or alarms. There were a few PI events daily, but nothing concerning and none back to back. LVAD equipment check completed and is in good working order.  Back-up equipment present.   LVAD education done on emergency procedures and precautions and reviewed exit site care.  Filed Vitals:   08/09/14 1002  BP: 80/0  Pulse: 65  Height: 6\' 1"  (1.854 m)  Weight: 225 lb (102.059 kg)  SpO2: 96%    Physical Exam: MAP 80 GENERAL: Well appearing, male; NAD; wife present HEENT: normal  NECK: Supple, JVP 9-10. 2+ bilaterally, no bruits.  No lymphadenopathy or thyromegaly appreciated.   CARDIAC:  Mechanical heart sounds with LVAD hum  present.  LUNGS:  Clear to auscultation bilaterally.  ABDOMEN:  Soft, round, nontender, positive bowel sounds x4.     LVAD exit site: well-healed and incorporated.  Dressing dry and intact.  No erythema, drainage, odor or tenderness.  Stabilization device present and accurately applied.  Driveline dressing is being changed daily per sterile technique. Changed in clinic EXTREMITIES:  Warm and dry, no cyanosis, clubbing, no edema NEUROLOGIC:  Alert and oriented x 4.  Gait steady.  No aphasia.  No dysarthria.  Affect pleasant.      ASSESSMENT AND PLAN:   1) Chronic Systolic HF: s/p LVAD implant 12/2012 - NYHA II symptoms and volume status mildly elevated. Instructed to take 40 mg po lasix today.  -  Not on BB due  to bradycardia. Continue losartan 50 mg daily which he is tolerating. HR 80s. MAP stable.  - Reinforced the need and importance of daily weights, a low sodium diet, and fluid restriction (less than 2 L a day). Instructed to call the HF clinic if weight increases more than 3 lbs overnight or 5 lbs in a week.  2)  Anticoagulation management  -  No ASA with recent bleeding. INR goal 1.8-2.5 .  INR 2.3  Discussed with pharmacy.   Continue current regimen. Continue coumadin 5 mg daily except on Tuesday and Friday he will take 7.5 mg coumadin .  3) Anemia/ GIB  - Hgb stable.  Continue iron supplementation and no longer on ASA. 4 LVAD - No issues. All parameters within normal limits.   - Check CBC, BMET, LDH and INR- Lab work stable. Coumadin adjusted. Per pharmacy 5. PAF - on Coumadin. No beta blocker due to bradycardia.   6. Depression - Stable.  Continue Celexa 40 mg daily 7. OSA- not using CPAP. I encouraged him to start wearing again.   Follow up in 4 weeks.  CLEGG,AMY NP-C  10:23 AM

## 2014-08-09 NOTE — Progress Notes (Signed)
Symptom  Yes  No  Details   Angina         x Activity:   Claudication                x How far:  Syncope         x When:   Stroke         x   Orthopnea         x How many pillows:   2 for comfort  PND         x How often:  CPAP         x  How many hrs:  Not wearing cpap or O2 at night  Pedal edema         x   Abd fullness         x   N&V         x   Diaphoresis         x When:  Bleeding               x   Urine color    medium yellow  SOB        x  Activity:  incline  Palpitations         x When:  ICD shock         x   Hospitlizaitons        x        When/where/why:  10/24 - 08/04/14 anemia; SOB  ED visit         x When/where/why:  Other MD              x When/who/why:    Activity    No formal activity; sedentary  Fluid    No limitations  Diet    No limitations   Vital signs: HR:  65 MAP BP:  80 O2 Sat: 96 Wt:  225  lbs Last wt::  231.2 lbs Ht: 6'1"  LVAD interrogation reveals:  Speed: 9200 Flow:  5.3 Power:  5.6 PI:  5.7 Alarms: none Events: rare PI           Fixed speed:  9200 Low speed limit: 8600 Primary Controller:  Replace back up battery in 20 months (January 2017) Back up controller:   Replace back up battery in  20 months (January 2017)  LVAD exit site:  Well healed and incorporated. The velour is fully implanted at exit site. Dressing dry and intact. No erythema or drainage. Stabilization device present and accurately applied. Driveline dressing is being changed weekly per sterile technique using Sorbaview dressing with biopatch on exit site. Pt denies fever or chills. Dressing supplies provided to patient/caregiver.  Pt/caregiver deny any alarms or VAD equipment issues. VAD coordinator reviewed daily log from home for daily temperature, weight, and VAD parameters. Pt is performing daily controller and system monitor self tests along with completing weekly and monthly maintenance for LVAD equipment. LVAD equipment check completed and is in good working order.  Back-up equipment present. Back up system controller battery charged during clinic visit.

## 2014-08-09 NOTE — Patient Instructions (Signed)
1.  Take Lasix 40 mg today (along with 10 meq K-Dur) 2.  No change in coumadin dose - recheck INR 08/23/14 3.  Return to [facility] clinic 09/26/14 at 12 noon.

## 2014-08-11 ENCOUNTER — Ambulatory Visit (INDEPENDENT_AMBULATORY_CARE_PROVIDER_SITE_OTHER): Payer: Medicare Other | Admitting: *Deleted

## 2014-08-11 DIAGNOSIS — I255 Ischemic cardiomyopathy: Secondary | ICD-10-CM

## 2014-08-12 ENCOUNTER — Telehealth: Payer: Self-pay | Admitting: Cardiology

## 2014-08-12 DIAGNOSIS — I255 Ischemic cardiomyopathy: Secondary | ICD-10-CM

## 2014-08-12 LAB — MDC_IDC_ENUM_SESS_TYPE_REMOTE
Battery Voltage: 3.06 V
Brady Statistic RV Percent Paced: 0.02 %
Date Time Interrogation Session: 20151113223007
HIGH POWER IMPEDANCE MEASURED VALUE: 51 Ohm
HighPow Impedance: 285 Ohm
HighPow Impedance: 40 Ohm
Lead Channel Impedance Value: 456 Ohm
Lead Channel Setting Pacing Pulse Width: 0.4 ms
Lead Channel Setting Sensing Sensitivity: 0.3 mV
MDC IDC MSMT LEADCHNL RV SENSING INTR AMPL: 8.25 mV
MDC IDC SET LEADCHNL RV PACING AMPLITUDE: 2.5 V
Zone Setting Detection Interval: 250 ms
Zone Setting Detection Interval: 310 ms
Zone Setting Detection Interval: 410 ms

## 2014-08-12 NOTE — Progress Notes (Signed)
Remote ICD transmission.   

## 2014-08-12 NOTE — Telephone Encounter (Signed)
LMOVM reminding pt to send remote transmission.   

## 2014-08-14 NOTE — Clinical Social Work Psych Note (Signed)
[place] CLINICAL SOCIAL WORK DOCUMENTATION LVAD (Left Ventricular Assist Device) Psychosocial Screening Please remember that all information is confidential within the members of the VAD team and [facility]  01/11/2013 1:50:19 PM  Patient:  ONAJE Wood  MRN:  [id number]  Account:  [id number]  Clinical Social Worker:  Ryan Penny Alandis Bluemel, LCSW Date/Time Initiated:   01/11/2013 01:50 PM Referral Source:    Physican, LVAD Coordinator  Referral Reason:   LVAD Placement  Source of Information:   Pateint,[name] and medical record  CSW met with this 73 year old male and his Ryan [name] to complete the SW LVAD Psychosocial Screen as patient has beenr referred for LVAD implantation per MD.  Patient currently a patient at [facility] and is currently being worked up for possible LVAD.  CSW  explained the role of the HF CSW and the purpose of the psychosocial assessment. Discussed the necessity and length of the evaluation and encouraged patient and his Ryan to ask questions and provide open, honest answers durign the evaluation. Patient states the absolutely refuses to consider transportation option and thus the VAD would be a permanent part of his life.  Patient feels very strongly about this and his Ryan stated that she supports his decisions.   PATIENT DEMOGRAPHICS NAME:   [name]     DOB:  [birth year]  SS#:  [id number] Address:   [address]  [place] [address] Home Phone:  [phone]  Cell Phone:    Marital Status:   Married     Primary Language:  ENGLISH  Faith:  [employer] Adherence with Medical Regimen:   Yes  Medication Adherence:   Yes  Physician appointment attendance:   Yes   Do you have a Living Will or Medical POA?  Y Would you like to complete a Living Will and Medical POA prior to surgery?  N Are you currently a DNR?  N Do you have a MOST form?  N Would you like to review one?  N Do you have goals of care?  N   Have  you had a consult with the palliative care team at [facility]?   Comment:  Ryan is his HCPOA per his Living Will. He will gladly meet with Palliative care to discuss goals of care.  Psychological Health Appearance:   Neat, Clean and well groomed. Dressed in hospital gown- sitting up in bed.  Mental status:   Fully alert and oriented. No deficits noted.  Eye Contact:   Good eye contact- appropriate for situation  Thought Content:   Clear, rational and well considered. Relevant to topics discussed  Speech:   Normal tone, well modulated  Mood:   Patient was in good spirits despite not feeling well today.  Affect:   Full ranging and appropriate for mood  Insight:   Good insight noted  Judgment:   Good  Interaction Style:   Good insight noted   Family/Social Information Who lives in your home? Name9  [name]   Age9  71   Relationship to you9  Wife    Do you have a plan for child care if relevant?   NA  List family members outside the home (parents, friends, pastor, etc..) Name2  [name]    Relationship to OTR7  Friend  Friend    Please list people who give you emotional support (family, parents, friends, pastor, etc..) [name] has 3 brothers who are very supportive  Neighbors  Church family      Who is your primary and backup support pre and post-surgery? Explain the relationships i.e. strengths/weakness, etc.:   [name] is primary caregiver- Ryan. Very strong support noted. She states she is fully committed to providing the best care possible for her husband.  No weakness noted at thsi tiem.  Secondary Caregiver identified:   [name]   (c) [phone]  W: [phone]  CWU [id number]   Legal Do you currently have any legal issues/problems?   No  Durable POA or Legal Next of Kin:   POA and NOKGiovonnie Wood  [phone]   Living situation Travel distance to [facility]:  15-17 miles  Second Hand  Smoke Exposure:   No smoking allowed in the house. All smoking visits will be required to go outside.  Self- Care level:   Patient: Still mostly independent "but I get winded easily."  Ambulation:   Patient- able to walk short distances only- then tires out. Currently does not require any assistive devices.  Transportation:   Patient has a pick up truck. [name] has a 2012 [employer]ata- Hybrid  Friend [name] also has a vehicle.  Limitations:   None  Barriers impacting ability to participate in care:   None   Community Are you active with community agencies/resources?   None at this time  Are you active in a church, synagogue, mosque, or other faith based community?   Yes- Very invovled. She is a Company secretary.  What other sources do you have for spiritual support?   Both:  Prayer, reading the bible, church activities  Are you active in any clubs or social organizations?   Patient is a member of the [employer] but is not currently active. He hopes someday to feel well enough to become active again. Both are very invovled in church activities  What do you do for fun? hobbies, interests?  Patient enjoys spending time with his dog "Bo"- whome he describes as a Oceanographer breed"- the became a part of the family 3 years ago and is good companion to him.  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  [name] is very invovled in church activities   Education/Work information What is the last grade of school you completed?  11th  Preferred method of learning? (Written, verbal, hands on):   Patient prefers to "do it"- i.e. hands on  [name]:  Prefers written material  Do you have any problems with reading or writing?  No  Are you currently employed? If no, when were you last employed?   No Employed. Retired in 2004.  100 % Disabled [place]- fully service connected  Name of employer:   NA  Please describe what kind of work you do/did?   NA  How long  have you worked there?   NA  If you are not currently working, do you plan to return to work after VAD surgery?   No  If yes, what type of employment do you hope to find?   NA  Are you interested in job training or learning new skills?   No  Did you serve in the Military? If so, what branch?   Yes- serviced in the [employer] and 100% service connected   [place] What is your source of income?    Patient:  VA:  $3,047/month   Social Security $1,144.00/month  Ryan: Disability  $1,207 per month  Do you have difficulty meeting your monthly expenses?   No- meeting bills ok.  If yes, which ones?    NA  How do you usually cope with this?    NA  Primary Health Insurance:    [facility]  [id number]  Secondary Insurance:    None  Have you applied for Medicaid?    No  Have you applied for Social Security Disability?    yes- receiving disability  Do you have prescription coverage?    Yes- Veterans    [place] covers cost of meds except for coumadin. MD visit are no charge.  What are your prescription co-pays?    None- VA covers  Are you required to use a certain pharmacy?    [facility]  Do you have a mail order option for your prescriptions?    [facility]  If yes, what pharmacy do you use for mail order?    [facility]  Have you ever refused medication due to cost?    No  Discuss monthly cost for dressing supplies post procedure $150-300    Patient has discussed with VAD coordinator-  Kits are about $35.00 per day and is working to see if the [place] will cover  Can you budget for this monthly expense?    Yes- plan for VA to reimburse   Medical Information Briefly describe your medical history, surgeries and why you are here for evaluation.    Patient states that he was doing fairly well until he started feeling poorly several months ago. He speaks of undergoing a CABG in 2003 and developed heart failure and sleep apnea. Patient states that he uses a CPAP at night for the apnea and the  doctors tell him that his heart is only working about about 30 %. He complains of being very tired all the time and feels he can barely go any more. The doctors have discussed the need for the LVAD and also discussed possible heart transplant but he is adamantly against the latter. He states that the LVAD would be as far as he will go.  Are you able to complete your ADL's?    Yes- some but I tired easily  Do you have any history of emotional, medical, physical or verbal trauma?    no  Do you have any family history of heart problems?    Father died at the ageof 69 from heart trouble  Mother had a heart attack and expired (Age not obtained)  Do you smoke? If so, what is the amount and frequency?    Fomer smoker for 45 years. Stoppped in 2003.  Never smoked again.  Do you drink alcohol? If so, how many drinks a day/week?    No  Have you ever used illegal drugs or misused medications?    No  If yes, what drugs do you use and how often?    No  Have you ever been treated for substance abuse?    No  If yes, where and when were you treated?    No  Are you currently using illegal substances?    No   Mental Health History Have you ever had problems with depression, anxiety or other mental health issues?   No  If yes, have you seen a counselor, psychiatrist or therapist?    No  If you are currently experiencing problems are you interested in talking with a professional?    No  Would you  be interested in participating in an LVAD support group?  Y LVAD support group for: Patient Caregivers patient Have you or are you taking any medications for anxiety/depression or any mental health concerns?    No  If yes, Please list the medications?    NA  How have you been feeling in the past year?    "I felt pretty good until the past few months and found that I could hardly do anything for myself anymore.  How do you handle stressful situations?   Plays with dog BO, watches TV and listens to the  radio..  What are your coping strategies? Please List:    "I go to my 'man cave' where I can relax. I try to concentrate on the present and not worry about the past or future."  Have you had any past or current thoughts of suicide?    No  How many hours do you sleep at night?    Sleeps  about 5 hours a night typically but is now napping in the daytime.  Uses a CPAP machine  How is your appetite?   Good- no problems with my appetite so far  PHQ2- Depression Screen:    0  PHQ9 Depression screen (only complete if the PHQ2 is positive):    0   Plan for VAD Implementation Do you know and understand what happens during VAD surgery?  Please describe your thoughts:   Patient and Ryan state that the [place] and VAD coordinator has reviewed the risks and benefits of surgery, anethesia and aftercare. Patient admits to being nervous about having the surgery but wants to feet better.  What do you know about the risks of any major surgery or use of general anesthesia?    "I know that any surgery is dangerous and risky- my heart isn't strong and I have sleep problems too.  My doctors said that they would watch me closely".  What do you know about the risks and side effects associated with VAD surgery?    "They told me I could die during surgery and that with the VAD there could be complications- I understand this but without it- I won't be able to continue to live either."  Explain what will happen right after surgery?    "I know I'll be in ICU. I will stay there until I'm stronger and they they will move me to another floor where I will recover.  I will have to be on a breathing machine for a little while."  Information obtained from:    MD, VAD Coordinator, Nursing  What is your plan for transportation for the first 8 weeks post-surgery? (Patients are not recommended to drive post-surgery for 8 weeks)    Ryan and friend will provide transportation  Driver: Ryan and friend have driver's license;  patient has current license but is aware that he will not be safe to drive for a while.  Valid license:    All designated drivers have current licenses  Working vehicle/last inspection:    Yes- All vehicled have currrent inspections and are in working order  Airbags:  Ryan's car has airbags Do you plan to disarm the airbags- there is a risk of discharging the device if the airbag were to deploy.   Unsure. Probably will not disarm them but patient is aware importance of sitting in the backseat during recovery period away from the front airbags  What do you know about your diet post-surgery?    Yes- is on  a Heart Healthy diet and this will continue. His Ryan prepares meals and helps him to keep on diet regimen  How do you plan to monitor your medications, current and future?    Patient normally keeps up with his meds but his Ryan will help him during the recover period.  How do you plan to complete ADL's post-surgery (Shower, dress, etc.)?    Ryan will help him to perform and ADL's that he will not be able to do during recovery. He is looking forward to becoming independent again.  Will it be difficult to ask for help for your caregivers? If so, explain:    Patient denies that is will be difficult to ask for help because he already has to ask for help due to generalize weakness and shortness of breath.  Please explain what you hope will be improved about your life as a result of receiving the VAD    "That I wont' be so exhausted and short of breath all the time- that I can move around and do the things I used to do before I got sick."  I don't want to have to rely on others to help me."  Please tell me your biggest concern or fear about receiving the VAD?    "I worry about getting through the surgery. I already know that my heart is failing and I have sleep apnea."  How do you cope with your concerns/fears?    I listen to the radio and play with my dog "Bo".  Patient states that he copes also  through faith and prayer as does his Ryan.  Please explain your understanding of how your body will change? Are you worried about these changes?    "I know I'll have to wear the batteries all the time and I can't get in a bathtub or swim. I know I'll have surgical scars but that's ok- it doesn't bother me."  Do you see any barriers to your surgery or follow up? If yes, please explain:   No     Understanding of LVAD Surgical procedures and risks:    Disccussed and reviewed  Electrical need for LVAD:    Patient states he has a regular generator at home already and his sister has a generator at her home as well. Patient and Ryan were able to verbalize the understanding of the batteries and necessity of keeping them charged.  They have 3 prong plugs at home.  Safety precautions with LVAD (Water, etc.):   Patient states that this has been reviewed with them by LVAD coordinator. No bath tubs, no swimming etc.  He states that he is ok with this.  Potential side effects with LVAD:    Discussed and Reviewed  Types of Advanced heart failure therapies available:    Discussed. Patient adamantly refuses to consider advanced therapy. "No Heart Transplant"  LVAD daily self-care (dressing changes, computer check, extra supplies):    Discussed and reviewed  Outpatient follow-up (follow-up in LVAD clinic; monitoring blood thinners)    Discussed and reviewed  Need for emergency planning:    Discussed and reviewed  Expectations for LVAD:    Discussed and reviewed.  Current level of motivation to prepare for LVAD:    Patient is motivated to proceed with surgery. He states that he wants to feel better.  Patient's perception of need for LVAD:    Patient and Ryan seem to have a very strong understanding of the LVAD surgery, reasons for the surgery and  after care needs.  Peceptions are realistic.  Present level of consent for LVAD:    Patient is giving consent to proceed with LVAD implantation if MD determines  that it is indicated  Reasons for seeking LVAD:    "My heart is failing and I can't keep living like this."   Psychosocial Protective Factors  Strong support from Ryan and other family No problems with transporation Good financial stability- monthly income is strong and has support from the [place] for meds and equipment No problems with drugs, alcohol or mental health issues Strong faith base No depression or anxiety Strong coping mechanisms in place- enjoys his dog No legal issues Motivated by hopes of feeling better Very independent minded Psychosocial Risk Factors  Patient does not wish to proceed to heart transplant- even if he becomes a candidate He has multiple medical issues including sleep apnea  Clinical Intervention: None at this time. CSW will provide intervention if needed during hospital recovery period.   Educated patient/family on the following Caregiver(s) role responsibilities:   Discussed and Reviewed  Financial planning for LVAD:    Discussed and reviewed.  Has steady income from [place], [place] from both patient and Ryan  Role of Clinical Social Worker:    Discussed and reviewed. Ongoing clinical support will be provided  Signs of depression and anxiety:    No signs of depression or anxiety noted at this time  Support planning for LVAD:    Discussed and reviewed  LVAD process:    Discussed and reviewed  Caregiver contract/agreement:   Discussed and reviewed. [place] contract/agreement will be further reviewed with patient and Ryan by LVAD coordinator  Discussed Referral(s) to:    CSW will assist with referrals as indicated. Patient and Ryan expressed interest in support groups for both patient and caregiver in the future.  Community Resources:    None at this time. CSW will continue to assess throughout the hospital recovery period.  Clinical Impression Recommendations:    CSW recommends patient to be considered for LVAD implantation. Patient and Ryan are able to  express a strong understanding of the need for the device as well as to have realistic expectations about the possible outcomes of surgery. Patient presents with a very strong sense of self and desire to reach next level of care to help him to have a better quality of life.  He appears to understand the challenges that he will face with surgery and Ryan/family/friends are willing to provide support and help.  Patient has 100 % service connection with the VA and feels that he will be able to get his meds, equipment and hopefully monthly supplies.  Patient and Ryan desire to proceed with surgery if indicated by MD and HF team.

## 2014-08-15 ENCOUNTER — Other Ambulatory Visit (HOSPITAL_COMMUNITY): Payer: Self-pay | Admitting: *Deleted

## 2014-08-15 MED ORDER — WARFARIN SODIUM 5 MG PO TABS: 5.0000 mg | ORAL_TABLET | Freq: Every day | ORAL | Status: DC

## 2014-08-23 ENCOUNTER — Other Ambulatory Visit (INDEPENDENT_AMBULATORY_CARE_PROVIDER_SITE_OTHER): Payer: Medicare Other | Admitting: *Deleted

## 2014-08-23 ENCOUNTER — Ambulatory Visit (HOSPITAL_COMMUNITY): Payer: Self-pay | Admitting: Anesthesiology

## 2014-08-23 DIAGNOSIS — Z95811 Presence of heart assist device: Secondary | ICD-10-CM

## 2014-08-23 DIAGNOSIS — Z7901 Long term (current) use of anticoagulants: Secondary | ICD-10-CM

## 2014-08-23 LAB — PROTIME-INR
INR: 2.9 ratio — AB (ref 0.8–1.0)
PROTHROMBIN TIME: 31.3 s — AB (ref 9.6–13.1)

## 2014-08-28 NOTE — Discharge Summary (Signed)
Advanced Heart Failure Team  Discharge Summary   Patient ID: [name] MRN: [id number], DOB/AGE: [id number] 73 y.o. Admit date: 07/23/2014 D/C date:     08/28/2014   Primary Discharge Diagnoses:  1. Exertional dyspnea: Resolved.  2. Anemia: Received total of 10 UPRBCSs. Discharge Hemoglobin 9.6  - He will remain off ASA but will have to stay on coumadin, goal INR 1.8-2.5.  - Push enteroscopy 10/28 negative - Colonoscopy 10/29 without source of bleeding. - Result of capsule endoscopy--> Showed bleeding from duodenum.  -EGD--->Bleeding from duodenum with clip applied.  -Restart iron today  - INR 1.78 on discharge 1.78  3. 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 PAF 5. CAD s/p CABG 6. 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 Quit smoking 2003. Uses CPAP and O2 every night. He is s/p LVAD HM II implanted 01/12/13 under DT criteria.   He was admitted with increased dyspnea that was related to his anemia with acute blood loss. Hemoglobin on admit was 8.2. GI was consulted and he and extensive work up as noted below. Over the course of his hospitalization he received 10 UPRBCs. Hemoglobin on discharge was 9.6. He will continue Protonix and stool softners. He did require heparin bridge. Coumadin was adjusted with goal for his INR remaining 1.8-2.5.   From HF perspective he remained stable and will continue diuretics as needed. Losartan was cut back to 25 mg daily.  He will continue to be followed closely in the HF clinic with follow up next Tuesday. Plan to check INR at that time.   GI Studies Push enteroscopy 10/28 negative Colonoscopy 10/29 without source of bleeding. Result of capsule endoscopy--> Showed bleeding from duodenum.  EGD--->Bleeding from duodenum with clip applied.    Discharge Weight Range: 207 pounds.  Discharge Vitals: Blood pressure 86/0, pulse 74, temperature 97.9 F (36.6 C), temperature source Oral, resp. rate 18, height 6' (1.829 m), weight 94.1 kg (207 lb 7.3 oz), SpO2 98 %.  Labs: Lab Results  Component Value Date   WBC 5.8 08/09/2014   HGB 10.7* 08/09/2014   HCT 35.6* 08/09/2014   MCV 98.9 08/09/2014   PLT 165 08/09/2014   No results for input(s): NA, K, CL, CO2, BUN, CREATININE, CALCIUM, PROT, BILITOT, ALKPHOS, ALT, AST, GLUCOSE in the last 168 hours.  Invalid input(s): LABALBU Lab Results  Component Value Date   CHOL 126 07/02/2012   HDL 40.80 07/02/2012   LDLCALC 58 07/02/2012   TRIG 134.0 07/02/2012   BNP (last 3 results)  Recent Labs  07/19/14 0830 07/23/14 1410 08/09/14 0953  PROBNP 296.4* 241.4* 478.1*    Diagnostic Studies/Procedures   No results found.  Discharge Medications     Medication List    STOP taking these medications        warfarin 5 MG tablet  Commonly known as:  COUMADIN      TAKE these medications        albuterol (2.5 MG/3ML) 0.083% nebulizer solution  Commonly known as:  PROVENTIL  Take 2.5 mg by nebulization every 6 (six) hours as needed for wheezing or shortness of breath.     budesonide-formoterol 160-4.5 MCG/ACT inhaler  Commonly known as:  SYMBICORT  Inhale 2 puffs into the lungs 2 (two) times daily.     cholecalciferol 1000 UNITS tablet  Commonly  known as:  VITAMIN D  Take 2,000 Units by mouth at bedtime.     citalopram 40 MG tablet  Commonly known as:  CELEXA  Take 40 mg by mouth at bedtime.     cyanocobalamin 1000 MCG tablet  Take 1,000 mcg by mouth at bedtime.     docusate sodium 100 MG capsule  Commonly known as:  COLACE  Take 200 mg by mouth 2 (two) times daily. Take one tab in am and two tabs hs     ferrous gluconate 324 MG tablet  Commonly known as:  FERGON  Take 324 mg by mouth 3 (three) times daily with meals.     furosemide 40 MG tablet  Commonly  known as:  LASIX  Take 40 mg by mouth daily as needed (fluid retention).     levothyroxine 100 MCG tablet  Commonly known as:  SYNTHROID, LEVOTHROID  Take 50 mcg by mouth at bedtime.     losartan 50 MG tablet  Commonly known as:  COZAAR  Take 0.5 tablets (25 mg total) by mouth daily.     MULTIVITAMIN PO  Take 1 tablet by mouth daily.     nitroGLYCERIN 0.4 MG SL tablet  Commonly known as:  NITROSTAT  Place 0.4 mg under the tongue every 5 (five) minutes as needed for chest pain.     pantoprazole 40 MG tablet  Commonly known as:  PROTONIX  Take 1 tablet (40 mg total) by mouth 2 (two) times daily.     potassium chloride 10 MEQ tablet  Commonly known as:  K-DUR  Take 10 mEq by mouth daily as needed (when taking Furosemide).     simvastatin 80 MG tablet  Commonly known as:  ZOCOR  Take 40 mg by mouth at bedtime.     tiotropium 18 MCG inhalation capsule  Commonly known as:  SPIRIVA  Place 18 mcg into inhaler and inhale daily.     vitamin C 500 MG tablet  Commonly known as:  ASCORBIC ACID  Take 500 mg by mouth 2 (two) times daily.        Disposition   The patient will be discharged in stable condition to home. Discharge Instructions    ACE Inhibitor / ARB already ordered    Complete by:  As directed      Diet - low sodium heart healthy    Complete by:  As directed      Increase activity slowly    Complete by:  As directed           Follow-up Information    Follow up with [facility] On 08/09/2014.   Why:  at 10:00 Garage Code 5000        Duration of Discharge Encounter: Greater than 35 minutes   Signed, Glori Bickers  NP-C  08/28/2014, 1:34 PM  Patient seen and examined with [name], NP. We discussed all aspects of the encounter. I agree with the assessment and plan as stated above. No further bleeding. He is ready for d/c. F/u in VAD Clinic.  Daniel Bensimhon,MD 1:34 PM

## 2014-08-30 ENCOUNTER — Other Ambulatory Visit (HOSPITAL_COMMUNITY): Payer: Self-pay | Admitting: *Deleted

## 2014-08-30 DIAGNOSIS — Z7901 Long term (current) use of anticoagulants: Secondary | ICD-10-CM

## 2014-08-30 DIAGNOSIS — Z95811 Presence of heart assist device: Secondary | ICD-10-CM

## 2014-08-31 ENCOUNTER — Encounter: Payer: Self-pay | Admitting: Cardiology

## 2014-09-04 NOTE — Discharge Summary (Signed)
Advanced Heart Failure Team  Discharge Summary   Patient ID: [name] MRN: [id number], DOB/AGE: 10/11/1940 73 y.o. Admit date: 07/23/2014 D/C date:     08/04/2014   Primary Discharge Diagnoses:  1. Exertional dyspnea: Resolved.  2. Anemia: Received total of 10 UPRBCSs. Discharge Hemoglobin 9.6  - He will remain off ASA but will have to stay on coumadin, goal INR 1.8-2.5.  - Push enteroscopy 10/28 negative - Colonoscopy 10/29 without source of bleeding. - Result of capsule endoscopy--> Showed bleeding from duodenum.  -EGD--->Bleeding from duodenum with clip applied.  -Restart iron today  - INR 1.78 on discharge 1.78  3. 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 PAF 5. CAD s/p CABG 6. 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 Quit smoking 2003. Uses CPAP and O2 every night. He is s/p LVAD HM II implanted 01/12/13 under DT criteria.   He was admitted with increased dyspnea that was related to his anemia with acute blood loss. Hemoglobin on admit was 8.2. GI was consulted and he and extensive work up as noted below. Over the course of his hospitalization he received 10 UPRBCs. Hemoglobin on discharge was 9.6. He will continue Protonix and stool softners. He did require heparin bridge. Coumadin was adjusted with goal for his INR remaining 1.8-2.5.   From HF perspective he remained stable and will continue diuretics as needed. Losartan was cut back to 25 mg daily.  He will continue to be followed closely in the HF clinic with follow up next Tuesday. Plan to check INR at that time.   GI Studies Push enteroscopy 10/28 negative Colonoscopy 10/29 without source of bleeding. Result of capsule endoscopy--> Showed bleeding from duodenum.  EGD--->Bleeding from duodenum with clip applied.   Discharge  Weight Range: 207 pounds.  Discharge Vitals: Blood pressure 86/0, pulse 74, temperature 97.9 F (36.6 C), temperature source Oral, resp. rate 18, height 6' (1.829 m), weight 94.1 kg (207 lb 7.3 oz), SpO2 98 %.  Labs: Lab Results  Component Value Date   WBC 5.8 08/09/2014   HGB 10.7* 08/09/2014   HCT 35.6* 08/09/2014   MCV 98.9 08/09/2014   PLT 165 08/09/2014   No results for input(s): NA, K, CL, CO2, BUN, CREATININE, CALCIUM, PROT, BILITOT, ALKPHOS, ALT, AST, GLUCOSE in the last 168 hours.  Invalid input(s): LABALBU Lab Results  Component Value Date   CHOL 126 07/02/2012   HDL 40.80 07/02/2012   LDLCALC 58 07/02/2012   TRIG 134.0 07/02/2012   BNP (last 3 results)  Recent Labs  07/19/14 0830 07/23/14 1410 08/09/14 0953  PROBNP 296.4* 241.4* 478.1*    Diagnostic Studies/Procedures   No results found.  Discharge Medications     Medication List    STOP taking these medications        warfarin 5 MG tablet  Commonly known as:  COUMADIN      TAKE these medications        albuterol (2.5 MG/3ML) 0.083% nebulizer solution  Commonly known as:  PROVENTIL  Take 2.5 mg by nebulization every 6 (six) hours as needed for wheezing or shortness of breath.     budesonide-formoterol 160-4.5 MCG/ACT inhaler  Commonly known as:  SYMBICORT  Inhale 2 puffs into the lungs 2 (two) times daily.     cholecalciferol 1000 UNITS tablet  Commonly  known as:  VITAMIN D  Take 2,000 Units by mouth at bedtime.     citalopram 40 MG tablet  Commonly known as:  CELEXA  Take 40 mg by mouth at bedtime.     cyanocobalamin 1000 MCG tablet  Take 1,000 mcg by mouth at bedtime.     docusate sodium 100 MG capsule  Commonly known as:  COLACE  Take 200 mg by mouth 2 (two) times daily. Take one tab in am and two tabs hs     ferrous gluconate 324 MG tablet  Commonly known as:  FERGON  Take 324 mg by mouth 3 (three) times daily with meals.     furosemide 40 MG tablet  Commonly known as:  LASIX   Take 40 mg by mouth daily as needed (fluid retention).     levothyroxine 100 MCG tablet  Commonly known as:  SYNTHROID, LEVOTHROID  Take 50 mcg by mouth at bedtime.     losartan 50 MG tablet  Commonly known as:  COZAAR  Take 0.5 tablets (25 mg total) by mouth daily.     MULTIVITAMIN PO  Take 1 tablet by mouth daily.     nitroGLYCERIN 0.4 MG SL tablet  Commonly known as:  NITROSTAT  Place 0.4 mg under the tongue every 5 (five) minutes as needed for chest pain.     pantoprazole 40 MG tablet  Commonly known as:  PROTONIX  Take 1 tablet (40 mg total) by mouth 2 (two) times daily.     potassium chloride 10 MEQ tablet  Commonly known as:  K-DUR  Take 10 mEq by mouth daily as needed (when taking Furosemide).     simvastatin 80 MG tablet  Commonly known as:  ZOCOR  Take 40 mg by mouth at bedtime.     tiotropium 18 MCG inhalation capsule  Commonly known as:  SPIRIVA  Place 18 mcg into inhaler and inhale daily.     vitamin C 500 MG tablet  Commonly known as:  ASCORBIC ACID  Take 500 mg by mouth 2 (two) times daily.        Disposition   The patient will be discharged in stable condition to home. Discharge Instructions    ACE Inhibitor / ARB already ordered    Complete by:  As directed      Diet - low sodium heart healthy    Complete by:  As directed      Increase activity slowly    Complete by:  As directed           Follow-up Information    Follow up with [facility] On 08/09/2014.   Why:  at 10:00 Garage Code 5000        Duration of Discharge Encounter: Greater than 35 minutes   Signed, Glori Bickers  NP-C  09/04/2014, 2:15 PM  Patient seen and examined with [name], NP. We discussed all aspects of the encounter. I agree with the assessment and plan as stated above. No further bleeding. He is ready for d/c. F/u in [facility].  Ramin Zoll,MD 2:15 PM

## 2014-09-05 ENCOUNTER — Encounter: Payer: Self-pay | Admitting: Internal Medicine

## 2014-09-06 ENCOUNTER — Ambulatory Visit (HOSPITAL_COMMUNITY): Payer: Self-pay | Admitting: *Deleted

## 2014-09-06 ENCOUNTER — Other Ambulatory Visit (INDEPENDENT_AMBULATORY_CARE_PROVIDER_SITE_OTHER): Payer: Medicare Other | Admitting: *Deleted

## 2014-09-06 DIAGNOSIS — Z7901 Long term (current) use of anticoagulants: Secondary | ICD-10-CM

## 2014-09-06 DIAGNOSIS — Z95811 Presence of heart assist device: Secondary | ICD-10-CM

## 2014-09-06 LAB — CBC
HCT: 38.9 % — ABNORMAL LOW (ref 39.0–52.0)
Hemoglobin: 12.6 g/dL — ABNORMAL LOW (ref 13.0–17.0)
MCHC: 32.5 g/dL (ref 30.0–36.0)
MCV: 94.5 fl (ref 78.0–100.0)
Platelets: 148 10*3/uL — ABNORMAL LOW (ref 150.0–400.0)
RBC: 4.12 Mil/uL — ABNORMAL LOW (ref 4.22–5.81)
RDW: 18.1 % — AB (ref 11.5–15.5)
WBC: 8.6 10*3/uL (ref 4.0–10.5)

## 2014-09-06 LAB — PROTIME-INR
INR: 2.5 ratio — ABNORMAL HIGH (ref 0.8–1.0)
PROTHROMBIN TIME: 27.3 s — AB (ref 9.6–13.1)

## 2014-09-08 ENCOUNTER — Encounter (HOSPITAL_COMMUNITY): Payer: Self-pay | Admitting: Internal Medicine

## 2014-09-13 ENCOUNTER — Telehealth (HOSPITAL_COMMUNITY): Payer: Self-pay | Admitting: Vascular Surgery

## 2014-09-13 NOTE — Telephone Encounter (Signed)
Junie Bame, NP completed form and faxed in, pt's wife aware

## 2014-09-13 NOTE — Telephone Encounter (Signed)
Pt wife calling about form for [employer]. She wants to know has it been filled out and faxed yet?

## 2014-09-20 IMAGING — CT CT CHEST W/ CM
2 of 3 series · 14 of 36 positions shown, 17 images · IV contrast (APPLIED)
Comparison: No priors.

CLINICAL DATA: Severe ischemic cardiomyopathy.  Under consideration
for left ventricular assist device (LVAD) placement.

CT CHEST WITH CONTRAST
TECHNIQUE: Multidetector CT imaging of the chest was performed
following the standard protocol during bolus administration of
intravenous contrast.
Contrast: 80mL OMNIPAQUE IOHEXOL 300 MG/ML  SOLN

[Series 2: routine chest 5.0 st · axial · 0.79mm/px · z∈[-350,-55]mm · 11 of 71 slices shown, 14 images]
[im 6/71  mediastinal]
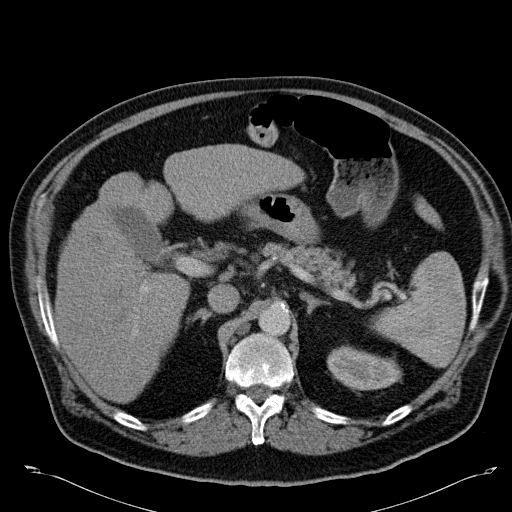
[im 6/71  lung]
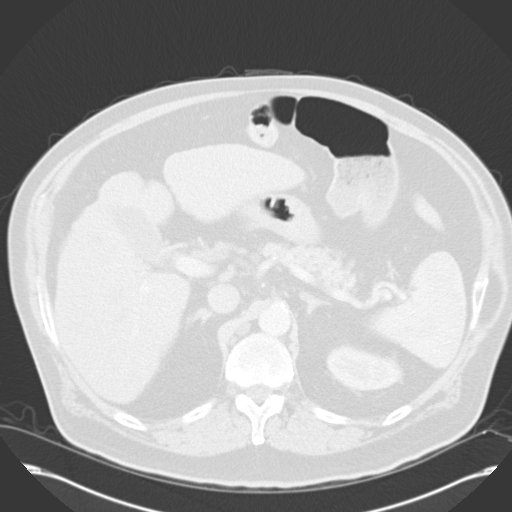
[im 11/71  lung]
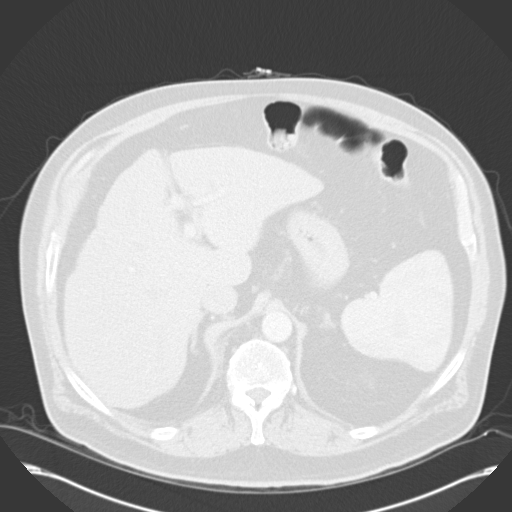
[im 16/71  lung]
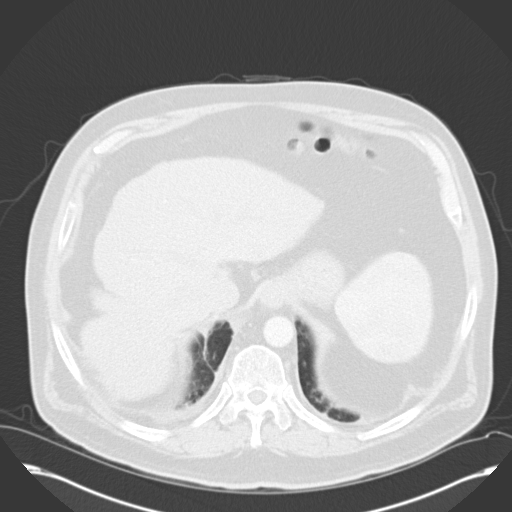
[im 24/71  lung]
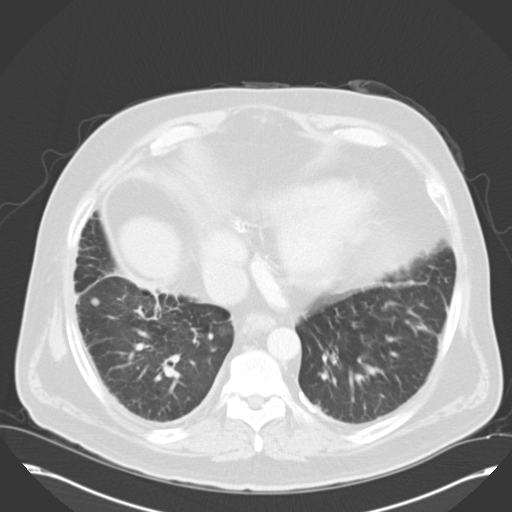
[im 29/71  mediastinal]
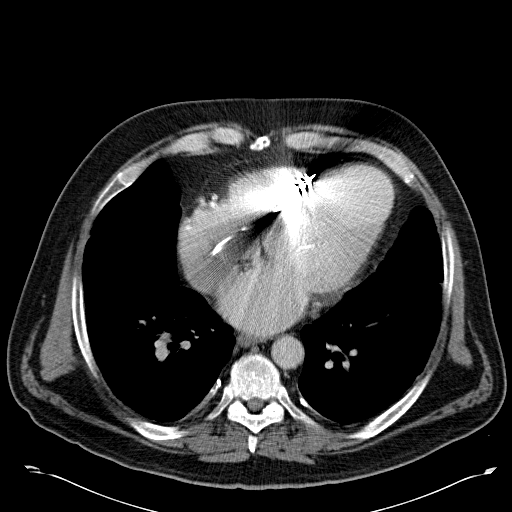
[im 29/71  lung]
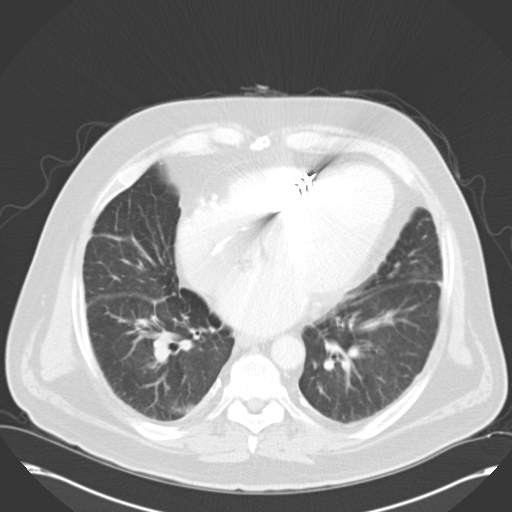
[im 37/71  lung]
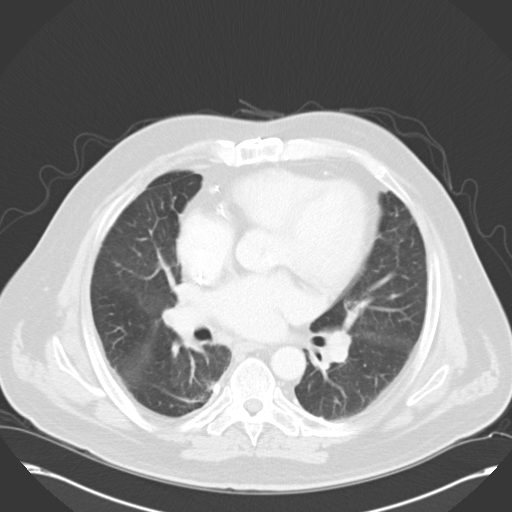
[im 42/71  lung]
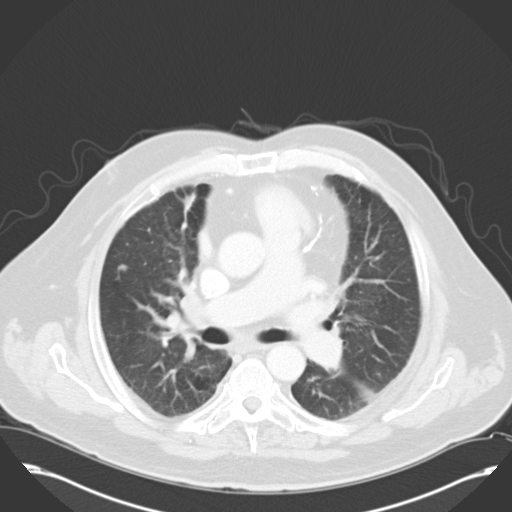
[im 47/71  lung]
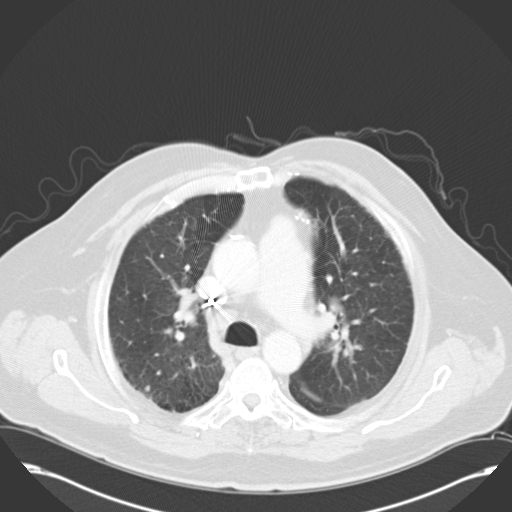
[im 55/71  mediastinal]
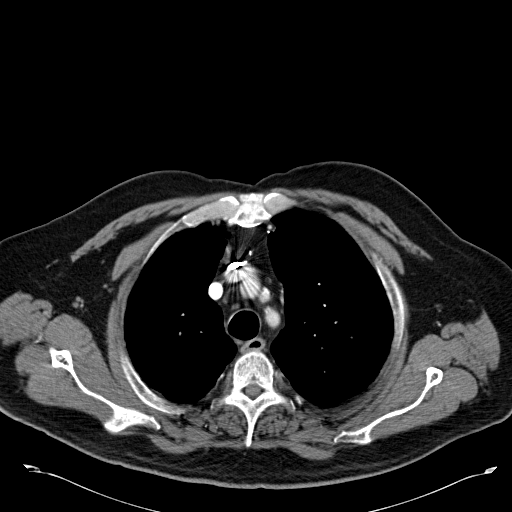
[im 55/71  lung]
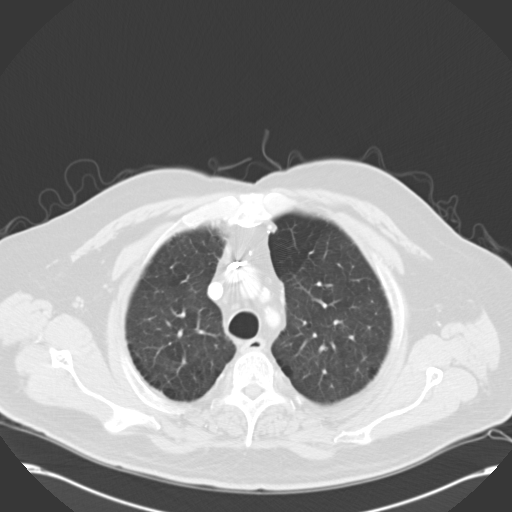
[im 60/71  lung]
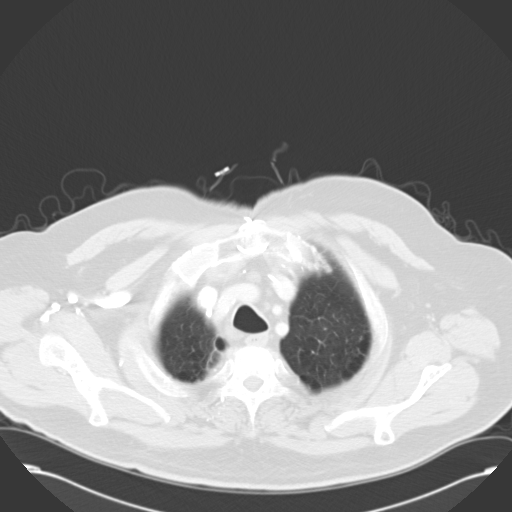
[im 65/71  lung]
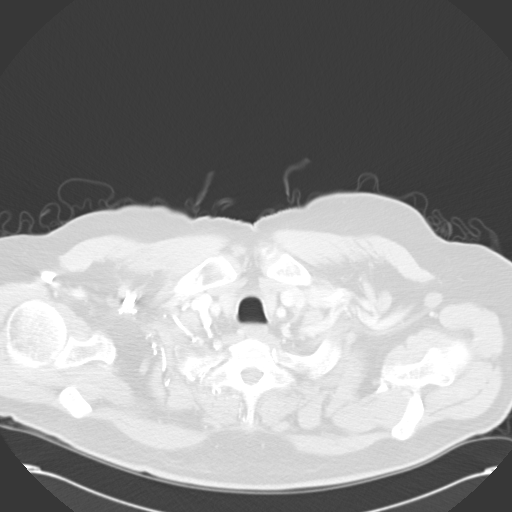

[Series 5: coronals · coronal · 0.70mm/px · 3 of 91 slices shown]
[im 19/91  lung]
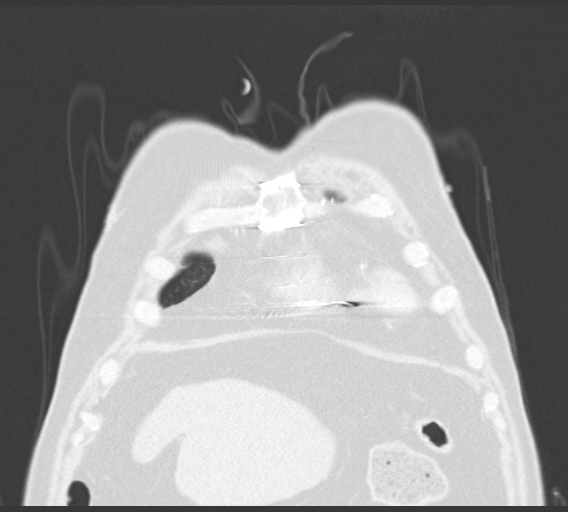
[im 37/91  lung]
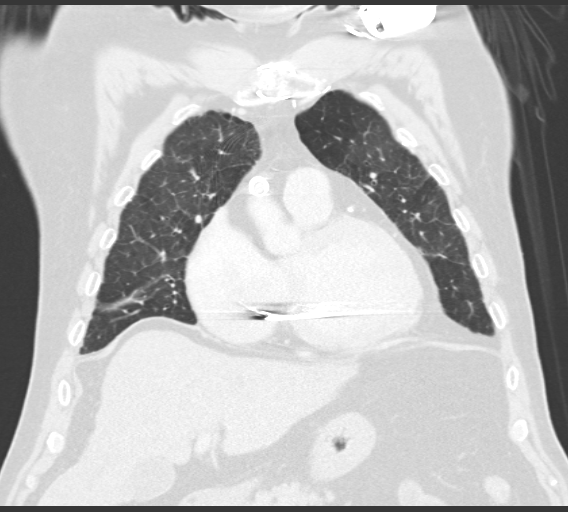
[im 55/91  lung]
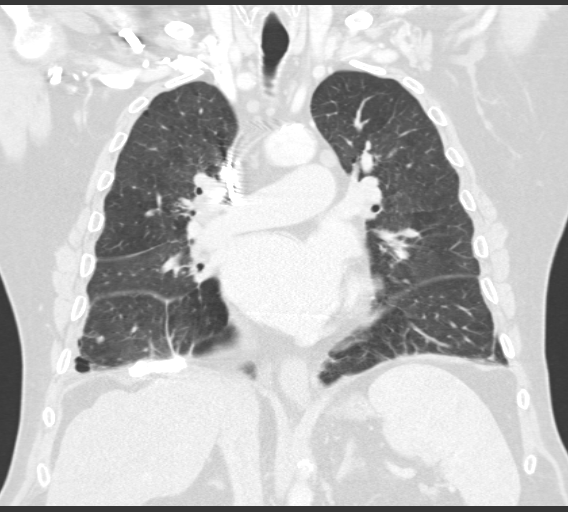

[14 of 36 positions shown; findings below may reference images not displayed]

FINDINGS: Mediastinum: Heart size is mildly enlarged. Notably, there is
severe thinning and mild aneurysmal dilatation of the apex of the
left ventricle, compatible with remodelling related to prior LAD
territory myocardial infarction. There is no significant
pericardial fluid, thickening or pericardial calcification. There
is atherosclerosis of the thoracic aorta, the great vessels of the
mediastinum and the coronary arteries, including calcified
atherosclerotic plaque in the left main, left anterior descending,
left circumflex and right coronary arteries. Status post median
sternotomy for CABG with [REDACTED] to the LAD and saphenous vein
grafts to the obtuse marginal and distal RCA circulation.  Numerous
reactive size mediastinal lymph nodes are noted and are
nonspecific.  No definite pathologic mediastinal or hilar lymph
node enlargement is noted.  The esophagus is unremarkable in
appearance. A left-sided pacemaker device is in place with lead tip
terminating near the right ventricular apex.

Lungs/Pleura: Bilateral calcified pleural plaques are noted, most
pronounced in the base of the thorax bilaterally.  This suggests
asbestos related pleural disease.  There is also some associated
pleural thickening (right greater than left).  There is a pattern
of subtle peripheral subpleural reticulation, which could be
indicative of a developing interstitial lung disease such as
asbestosis.  No frank honeycombing or definite traction
bronchiectasis is appreciated at this time.  There are some small
scattered pulmonary nodules, largest of which measure up to 8 mm in
the right upper lobe (image 30 of series 3) and 7 mm in the right
lower lobe (image 48 of series 3).  There is also a background of
mild centrilobular and paraseptal emphysema.

Upper Abdomen: Incompletely visualized low attenuation lesion in
the upper pole of the right kidney measures at least 3.8 cm and is
not characterized on this examination.  Small calcifications
layering dependently in the gallbladder are compatible with
calcified gallstones.  The liver has a shrunken appearance and
nodular contour, compatible with cirrhosis.  Portal vein appears
mildly dilated (16 mm).

Musculoskeletal: Sternotomy wires. There are no aggressive
appearing lytic or blastic lesions noted in the visualized portions
of the skeleton.
IMPRESSION: 1. Atherosclerosis, including left main and three-vessel coronary
artery disease.  There is marked myocardial thinning and aneurysmal
dilatation in the apex of the left ventricle, compatible with prior
LAD territory myocardial infarction.  The apex of the left
ventricle comes in close approximation to the anterolateral chest
wall, but there are no overt adverse features to prevent left
ventricular assist device placement noted on today's examination.
2.  The appearance of the lungs and pleura suggests both asbestos
related pleural disease and possible early changes of asbestosis.
Clinical correlation for history of asbestos exposure is suggested.
3.  Cirrhosis with mild dilatation of the portal vein which may
suggest developing portal hypertension.
4.  Mild centrilobular and paraseptal emphysema.
5.  Cholelithiasis without findings to suggest acute cholecystitis.
6.  Additional incidental findings, as above.

## 2014-09-21 ENCOUNTER — Emergency Department (HOSPITAL_COMMUNITY)
Admission: EM | Admit: 2014-09-21 | Discharge: 2014-09-21 | Disposition: A | Discharge status: Home / Self Care | Payer: Medicare Other | Attending: Emergency Medicine | Admitting: Emergency Medicine

## 2014-09-21 ENCOUNTER — Telehealth (HOSPITAL_COMMUNITY): Payer: Self-pay | Admitting: *Deleted

## 2014-09-21 ENCOUNTER — Encounter (HOSPITAL_COMMUNITY): Payer: Self-pay | Admitting: Emergency Medicine

## 2014-09-21 DIAGNOSIS — R630 Anorexia: Secondary | ICD-10-CM | POA: Diagnosis not present

## 2014-09-21 DIAGNOSIS — Z9861 Coronary angioplasty status: Secondary | ICD-10-CM | POA: Insufficient documentation

## 2014-09-21 DIAGNOSIS — I5022 Chronic systolic (congestive) heart failure: Secondary | ICD-10-CM | POA: Insufficient documentation

## 2014-09-21 DIAGNOSIS — Z87891 Personal history of nicotine dependence: Secondary | ICD-10-CM | POA: Diagnosis not present

## 2014-09-21 DIAGNOSIS — I472 Ventricular tachycardia: Secondary | ICD-10-CM | POA: Diagnosis not present

## 2014-09-21 DIAGNOSIS — I4891 Unspecified atrial fibrillation: Secondary | ICD-10-CM | POA: Diagnosis not present

## 2014-09-21 DIAGNOSIS — E669 Obesity, unspecified: Secondary | ICD-10-CM | POA: Diagnosis not present

## 2014-09-21 DIAGNOSIS — Z79899 Other long term (current) drug therapy: Secondary | ICD-10-CM | POA: Diagnosis not present

## 2014-09-21 DIAGNOSIS — R5383 Other fatigue: Secondary | ICD-10-CM | POA: Diagnosis present

## 2014-09-21 DIAGNOSIS — Z9981 Dependence on supplemental oxygen: Secondary | ICD-10-CM | POA: Diagnosis not present

## 2014-09-21 DIAGNOSIS — Z95811 Presence of heart assist device: Secondary | ICD-10-CM

## 2014-09-21 DIAGNOSIS — I10 Essential (primary) hypertension: Secondary | ICD-10-CM | POA: Diagnosis not present

## 2014-09-21 DIAGNOSIS — R350 Frequency of micturition: Secondary | ICD-10-CM | POA: Diagnosis not present

## 2014-09-21 DIAGNOSIS — Z7901 Long term (current) use of anticoagulants: Secondary | ICD-10-CM | POA: Diagnosis not present

## 2014-09-21 DIAGNOSIS — G4733 Obstructive sleep apnea (adult) (pediatric): Secondary | ICD-10-CM | POA: Insufficient documentation

## 2014-09-21 DIAGNOSIS — Z951 Presence of aortocoronary bypass graft: Secondary | ICD-10-CM | POA: Diagnosis not present

## 2014-09-21 DIAGNOSIS — E785 Hyperlipidemia, unspecified: Secondary | ICD-10-CM | POA: Diagnosis not present

## 2014-09-21 DIAGNOSIS — I2510 Atherosclerotic heart disease of native coronary artery without angina pectoris: Secondary | ICD-10-CM | POA: Insufficient documentation

## 2014-09-21 DIAGNOSIS — I480 Paroxysmal atrial fibrillation: Secondary | ICD-10-CM

## 2014-09-21 DIAGNOSIS — I252 Old myocardial infarction: Secondary | ICD-10-CM | POA: Insufficient documentation

## 2014-09-21 DIAGNOSIS — Z7951 Long term (current) use of inhaled steroids: Secondary | ICD-10-CM | POA: Diagnosis not present

## 2014-09-21 DIAGNOSIS — J441 Chronic obstructive pulmonary disease with (acute) exacerbation: Secondary | ICD-10-CM | POA: Insufficient documentation

## 2014-09-21 DIAGNOSIS — E039 Hypothyroidism, unspecified: Secondary | ICD-10-CM | POA: Diagnosis not present

## 2014-09-21 LAB — BASIC METABOLIC PANEL
ANION GAP: 7 (ref 5–15)
BUN: 30 mg/dL — ABNORMAL HIGH (ref 6–23)
CO2: 23 mmol/L (ref 19–32)
Calcium: 10.1 mg/dL (ref 8.4–10.5)
Chloride: 104 mEq/L (ref 96–112)
Creatinine, Ser: 0.96 mg/dL (ref 0.50–1.35)
GFR calc Af Amer: >90 mL/min (ref >90)
GFR, EST NON AFRICAN AMERICAN: 81 mL/min — AB (ref >90)
GLUCOSE: 147 mg/dL — AB (ref 70–99)
POTASSIUM: 4.2 mmol/L (ref 3.5–5.1)
SODIUM: 134 mmol/L — AB (ref 135–145)

## 2014-09-21 LAB — CBC
HCT: 35 % — ABNORMAL LOW (ref 39.0–52.0)
Hemoglobin: 11.1 g/dL — ABNORMAL LOW (ref 13.0–17.0)
MCH: 30.8 pg (ref 26.0–34.0)
MCHC: 31.7 g/dL (ref 30.0–36.0)
MCV: 97.2 fL (ref 78.0–100.0)
PLATELETS: 189 10*3/uL (ref 150–400)
RBC: 3.6 MIL/uL — AB (ref 4.22–5.81)
RDW: 17.6 % — ABNORMAL HIGH (ref 11.5–15.5)
WBC: 11.2 10*3/uL — AB (ref 4.0–10.5)

## 2014-09-21 LAB — I-STAT TROPONIN, ED: TROPONIN I, POC: 0.01 ng/mL (ref 0.00–0.08)

## 2014-09-21 LAB — PROTIME-INR
INR: 2.87 — ABNORMAL HIGH (ref 0.00–1.49)
Prothrombin Time: 30.3 seconds — ABNORMAL HIGH (ref 11.6–15.2)

## 2014-09-21 LAB — LACTATE DEHYDROGENASE: LDH: 275 U/L — AB (ref 94–250)

## 2014-09-21 MED ORDER — ETOMIDATE 2 MG/ML IV SOLN
INTRAVENOUS | Status: AC | PRN
  Administered 2014-09-21: 10 mg via INTRAVENOUS

## 2014-09-21 MED ORDER — ETOMIDATE 2 MG/ML IV SOLN: 10.0000 mg | Freq: Once | INTRAVENOUS | Status: DC

## 2014-09-21 MED ORDER — SILVER SULFADIAZINE 1 % EX CREA
TOPICAL_CREAM | Freq: Every day | CUTANEOUS | Status: DC
  Administered 2014-09-21: 19:00:00 via TOPICAL
  Filled 2014-09-21: qty 85

## 2014-09-21 MED ORDER — FENTANYL CITRATE 0.05 MG/ML IJ SOLN
INTRAMUSCULAR | Status: AC | PRN
Start: 2014-09-21 — End: 2014-09-21
  Administered 2014-09-21: 50 ug via INTRAVENOUS

## 2014-09-21 MED ORDER — ROCURONIUM BROMIDE 50 MG/5ML IV SOLN
INTRAVENOUS | Status: AC
  Filled 2014-09-21: qty 2

## 2014-09-21 MED ORDER — LIDOCAINE HCL (CARDIAC) 20 MG/ML IV SOLN
INTRAVENOUS | Status: AC
  Filled 2014-09-21: qty 5

## 2014-09-21 MED ORDER — SUCCINYLCHOLINE CHLORIDE 20 MG/ML IJ SOLN
INTRAMUSCULAR | Status: AC
  Filled 2014-09-21: qty 1

## 2014-09-21 MED ORDER — ETOMIDATE 2 MG/ML IV SOLN
INTRAVENOUS | Status: AC
  Filled 2014-09-21: qty 20

## 2014-09-21 MED ORDER — FENTANYL CITRATE 0.05 MG/ML IJ SOLN
INTRAMUSCULAR | Status: AC
  Filled 2014-09-21: qty 2

## 2014-09-21 NOTE — Progress Notes (Signed)
RT called to be at bedside for cardioversion. Vitals stable.

## 2014-09-21 NOTE — ED Notes (Addendum)
Pt BIB wife for fatigue, falling asleep easily Pt pale per wife. Pink appearance on arrival. MAP 70 doppler. Pt awake, alert, oriented x4, VAD in place with batteries.

## 2014-09-21 NOTE — H&P (Signed)
Primary Physician: Dr. Veleta Miners [facility]) [phone] Primary Cardiologist:  Dr. 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 Quit smoking 2003. Uses CPAP and O2 every night.  LVAD implant 01/11/13 under Destination Therapy Criteria.   He has had issues with GIB and was last admitted in 06/2014 for GIB. Underwent Push enteroscopy 10/28 which was negative and then colonoscopy 10/29 without source of bleeding. Capsule endoscopy showed bleeding from duodenum and had repeat EGD with bleeding from duodenum and had a clip applied. Received a total of 10 PRBCs.   He has been in his usual state of health until a couple of days ago when he started feeling weak, fatigued, dizzy and minimal SOB. He has not had any BRBPR. No alarms. Taking medications as prescribed and weight has been stable at 220-225 lbs.   LVAD INTERROGATION:  HeartMate II LVAD:  Flow 5.2 liters/min, speed 9200, power 5.5, PI 6.5; No alarms and minimal PI events     Review of Systems: [y] = yes, [ ]  = no   General: Weight gain [ ] ; Weight loss [ ] ; Anorexia [ ] ; Fatigue [Y ]; Fever [ N]; Chills [ N]; Weakness [Y ]  Cardiac: Chest pain/pressure [ N]; Resting SOB Aqua.Slicker ]; Exertional SOB [Y ]; Orthopnea [ N]; Pedal Edema Aqua.Slicker ]; Palpitations Aqua.Slicker ]; Syncope [ ] ; Presyncope [N ]; Paroxysmal nocturnal dyspnea[ ]  diaphoresis [Y] Pulmonary: Cough [ ] ; Wheezing[ ] ; Hemoptysis[ ] ; Sputum [ ] ; Snoring [ ]   GI: Vomiting[ ] ; Dysphagia[ ] ; Melena[ ] ; Hematochezia [ ] ; Heartburn[ ] ; Abdominal pain [ ] ; Constipation [ ] ; Diarrhea Aqua.Slicker ]; BRBPR Aqua.Slicker ]  GU: Hematuria[ ] ; Dysuria [ ] ; Nocturia[ ]   Vascular: Pain in legs with walking [ ] ; Pain in feet with lying flat [ ] ; Non-healing sores [ ] ; Stroke [ ] ; TIA [ ] ; Slurred speech [ ] ;  Neuro: Headaches[ ] ; Vertigo[ ] ; Seizures[  ]; Paresthesias[ ] ;Blurred vision [ ] ; Diplopia [ ] ; Vision changes [ ]   Ortho/Skin: Arthritis [ ] ; Joint pain [ ] ; Muscle pain [ ] ; Joint swelling [ ] ; Back Pain [ ] ; Rash [ ]   Psych: Depression[ ] ; Anxiety[ ]   Heme: Bleeding problems [N ]; Clotting disorders [ ] ; Anemia [ ]   Endocrine: Diabetes [ ] ; Thyroid dysfunction[ ]   Home Medications Prior to Admission medications   Medication Sig Start Date End Date Taking? Authorizing Provider  albuterol (PROVENTIL) (2.5 MG/3ML) 0.083% nebulizer solution Take 2.5 mg by nebulization every 6 (six) hours as needed for wheezing or shortness of breath.     Historical Provider, MD  budesonide-formoterol (SYMBICORT) 160-4.5 MCG/ACT inhaler Inhale 2 puffs into the lungs 2 (two) times daily.    Historical Provider, MD  cholecalciferol (VITAMIN D) 1000 UNITS tablet Take 2,000 Units by mouth at bedtime.     Historical Provider, MD  citalopram (CELEXA) 40 MG tablet Take 40 mg by mouth at bedtime.    Historical Provider, MD  cyanocobalamin 1000 MCG tablet Take 1,000 mcg by mouth at bedtime.     Historical Provider, MD  docusate sodium (COLACE) 100 MG capsule Take 200 mg by mouth 2 (two) times daily. Take one tab in am and two tabs hs    Historical Provider, MD  ferrous gluconate (FERGON) 324 MG tablet Take 324 mg by mouth 3 (three) times daily  with meals.    Historical Provider, MD  furosemide (LASIX) 40 MG tablet Take 40 mg by mouth daily as needed (fluid retention).    Historical Provider, MD  levothyroxine (SYNTHROID, LEVOTHROID) 100 MCG tablet Take 50 mcg by mouth at bedtime.     Historical Provider, MD  losartan (COZAAR) 50 MG tablet Take 0.5 tablets (25 mg total) by mouth daily. 08/04/14   Amy D Ninfa Meeker, NP  Multiple Vitamins-Minerals (MULTIVITAMIN PO) Take 1 tablet by mouth daily.    Historical Provider, MD  nitroGLYCERIN (NITROSTAT) 0.4 MG SL tablet Place 0.4 mg under the tongue every 5 (five) minutes as needed for chest pain.     Historical Provider, MD   pantoprazole (PROTONIX) 40 MG tablet Take 1 tablet (40 mg total) by mouth 2 (two) times daily. 03/11/14   Rande Brunt, NP  potassium chloride (K-DUR) 10 MEQ tablet Take 10 mEq by mouth daily as needed (when taking Furosemide).    Historical Provider, MD  simvastatin (ZOCOR) 80 MG tablet Take 40 mg by mouth at bedtime.      Historical Provider, MD  tiotropium (SPIRIVA) 18 MCG inhalation capsule Place 18 mcg into inhaler and inhale daily.     Historical Provider, MD  vitamin C (ASCORBIC ACID) 500 MG tablet Take 500 mg by mouth 2 (two) times daily.     Historical Provider, MD  warfarin (COUMADIN) 5 MG tablet Take 1-1.5 tablets (5-7.5 mg total) by mouth daily. 7.5mg  on  Monday-Wednesday and Friday. 5mg  all other days 08/15/14   Jolaine Artist, MD    Past Medical History: Past Medical History  Diagnosis Date  . Ischemic cardiomyopathy      CABG 2003, PCI 2007  EF 27%(myoview 2012  . Chronic systolic heart failure   . Hyperlipidemia   . Hypothyroidism   . Chronic anticoagulation     Afib and LVAD  . Obesity   . COPD (chronic obstructive pulmonary disease)   . Asbestosis(501)     "6 years in the [place]" (05/24/2013)  . Atrial fibrillation     permanent  . Paroxysmal ventricular tachycardia   . Coronary artery disease   . Implantable cardioverter-defibrillator [employer]   . Hypertension   . Myocardial infarction 1990's-2000    "2 in  ~ the 1990's; 1 in ~ 2000" (05/24/2013)  . OSA on CPAP   . Depression   . LVAD (left ventricular assist device) present 12/2012    Past Surgical History: Past Surgical History  Procedure Laterality Date  . Coronary artery bypass graft  2003    "?X4" (05/24/2013)  . Cardiac defibrillator placement  2004; ~ 2010  . Insertion of implantable left ventricular assist device N/A 01/12/2013    Procedure: INSERTION OF IMPLANTABLE LEFT VENTRICULAR ASSIST DEVICE;  Surgeon: Ivin Poot, MD;  Location: [facility];  Service: Open Heart Surgery;  Laterality: N/A;    nitric oxide; Redo sternotomy  . Intraoperative transesophageal echocardiogram N/A 01/12/2013    Procedure: INTRAOPERATIVE TRANSESOPHAGEAL ECHOCARDIOGRAM;  Surgeon: Ivin Poot, MD;  Location: [facility];  Service: Open Heart Surgery;  Laterality: N/A;  . Colonoscopy N/A 03/09/2014    Procedure: COLONOSCOPY;  Surgeon: Inda Castle, MD;  Location: [facility];  Service: Endoscopy;  Laterality: N/A;  LVAD  patient  . Esophagogastroduodenoscopy N/A 03/09/2014    Procedure: ESOPHAGOGASTRODUODENOSCOPY (EGD);  Surgeon: Inda Castle, MD;  Location: [facility];  Service: Endoscopy;  Laterality: N/A;  . Givens capsule study N/A 03/10/2014    Procedure: GIVENS  CAPSULE STUDY;  Surgeon: Inda Castle, MD;  Location: [facility] ENDOSCOPY;  Service: Endoscopy;  Laterality: N/A;  . Enteroscopy N/A 07/27/2014    Procedure: ENTEROSCOPY;  Surgeon: Gatha Mayer, MD;  Location: [facility];  Service: Endoscopy;  Laterality: N/A;  LVAD patient   . Colonoscopy N/A 07/29/2014    Procedure: COLONOSCOPY;  Surgeon: Gatha Mayer, MD;  Location: [facility];  Service: Endoscopy;  Laterality: N/A;  . Givens capsule study N/A 07/30/2014    Procedure: GIVENS CAPSULE STUDY;  Surgeon: Gatha [facility], MD;  Location: [facility];  Service: Endoscopy;  Laterality: N/A;  . Esophagogastroduodenoscopy N/A 08/01/2014    Procedure: ESOPHAGOGASTRODUODENOSCOPY (EGD);  Surgeon: Jerene Bears, MD;  Location[facility] ENDOSCOPY;  Service: Endoscopy;  Laterality: N/A;  LVAD patient  . Left and right heart catheterization with coronary/graft angiogram  01/07/2013    Procedure: LEFT AND RIGHT HEART CATHETERIZATION WITH Beatrix Fetters;  Surgeon: Jolaine Artist, MD;  Location[facility] CATH LAB;  Service: Cardiovascular;;    Family History: Family History  Problem Relation Age of Onset  . Heart attack Mother   . Heart attack Father     Social History: History   Social History  . Marital Status: Married    Spouse Name: N/A     Number of Children: N/A  . Years of Education: N/A   Social History Main Topics  . Smoking status: Former Smoker -- 2.00 packs/day for 45 years    Types: Cigarettes    Quit date: 11/11/2001  . Smokeless tobacco: Never Used  . Alcohol Use: No     Comment: 05/24/2013 "use to drink beer; hardly nothing since 2003; once in awhile a beer "  . Drug Use: No  . Sexual Activity: No   Other Topics Concern  . None   Social History Narrative    Allergies:  No Known Allergies  Objective:    Vital Signs:   Temp:  [97.6 F (36.4 C)] 97.6 F (36.4 C) (12/23 1428) Pulse Rate:  [103] 103 (12/23 1428) Resp:  [25] 25 (12/23 1428) BP: (110)/(0) 110/0 mmHg (12/23 1428) SpO2:  [99 %] 99 % (12/23 1428) Weight:  [225 lb (102.059 kg)] 225 lb (102.059 kg) (12/23 1430)   Filed Weights   09/21/14 1430  Weight: 225 lb (102.059 kg)    Mean arterial Pressure 68-72  Physical Exam: General:  Well appearing. No resp difficulty, wife present HEENT: normal Neck: supple. JVP 6-7 . Carotids 2+ bilat; no bruits. No lymphadenopathy or thryomegaly appreciated. Cor: Mechanical heart sounds with LVAD hum present. Lungs: clear Abdomen: soft, nontender, nondistended. No hepatosplenomegaly. No bruits or masses. Good bowel sounds. Driveline: C/D/I; securement device intact and driveline incorporated Extremities: no cyanosis, clubbing, rash, edema Neuro: alert & orientedx3, cranial nerves grossly intact. moves all 4 extremities w/o difficulty. Affect pleasant  Telemetry: Afib 80-110s  Labs: Basic Metabolic Panel: No results for input(s): NA, K, CL, CO2, GLUCOSE, BUN, CREATININE, CALCIUM, MG, PHOS in the last 168 hours.  Liver Function Tests: No results for input(s): AST, ALT, ALKPHOS, BILITOT, PROT, ALBUMIN in the last 168 hours. No results for input(s): LIPASE, AMYLASE in the last 168 hours. No results for input(s): AMMONIA in the last 168 hours.  CBC: No results for input(s): WBC, NEUTROABS, HGB,  HCT, MCV, PLT in the last 168 hours.  Cardiac Enzymes: No results for input(s): CKTOTAL, CKMB, CKMBINDEX, TROPONINI in the last 168 hours.  BNP: BNP (last 3 results)  Recent Labs  07/19/14 0830  07/23/14 1410 08/09/14 0953  PROBNP 296.4* 241.4* 478.1*    CBG: No results for input(s): GLUCAP in the last 168 hours.  Coagulation Studies: No results for input(s): LABPROT, INR in the last 72 hours.  Other results: EKG: Atrial fibrillation  Imaging:  No results found.      Assessment:   1) PAF - was in NSR 07/25/14 2) Weakness/Fatigue 3) Chronic systolic HF - s/p LVAD 11/6331 4) Anemia 5) HTN  Plan/Discussion:    Mr. Mehra is well known to the HF team and has been doing well until earlier this week when he started having weakness, fatigue, minimal SOB and dizziness. Wife has been sick and they were concerned he was coming down with an infection or he was having GIB again.  Volume status looks stable. Will continue lasix PRN.  LVAD parameters stable. MAP is a little on the low side, will cut losartan to 25 mg daily.   Patient is back in atrial fibrillation and am concerned this maybe what is contributing to his weakness, dizziness and minimal SOB. ICD interrogated and he has single lead so not sure when he went into afib. If he has a therapeutic INR today could schedule for DC-CV (will have had 4 therapeutic INRs).   CBC checked and Hgb stable.   Will get CXR with slight elevation in WBC.   Consult pharmacy for coumadin dosing.   I reviewed the LVAD parameters from today, and compared the results to the patient's prior recorded data.  No programming changes were made.  The LVAD is functioning within specified parameters.  The patient performs LVAD self-test daily.  LVAD interrogation was negative for any significant power changes, alarms or PI events/speed drops.  LVAD equipment check completed and is in good working order.  Back-up equipment present.   LVAD  education done on emergency procedures and precautions and reviewed exit site care.  Length of Stay: 0  Rande Brunt NP-C 09/21/2014, 2:36 PM  VAD Team Pager [phone] (7am - 7am) +++VAD ISSUES ONLY+++  Advanced Heart Failure Team Pager [phone] (M-F; [place])  Please contact [facility] for night-coverage after hours (4p -7a ) and weekends on [email or web address] for all non- LVAD Issues  Patient seen and examined with Junie Bame, NP. We discussed all aspects of the encounter. I agree with the assessment and plan as stated above.   Suspect recurrent AF may be reason for his fatigue. H/H ok. INRs are therapeutic. Will plan DC-CV in ER and discharge home with close HF f/u.  Benay Spice 4:39 PM

## 2014-09-21 NOTE — ED Provider Notes (Signed)
Patient seen/examined in the Emergency Department in conjunction with Resident Physician Provider  Patient reports fatigue Exam : awake/alert, no distress, he is resting comfortably  Plan: admit patient.  Patient with VAD in place and he is noted to be in atrial fibrillation Pt is currently stable in the ER    Sharyon [facility], MD 09/21/14 1517

## 2014-09-21 NOTE — ED Provider Notes (Signed)
CSN: [id number]     Arrival date & time 09/21/14  1417 History   First MD Initiated Contact with Patient 09/21/14 1509     Chief Complaint  Patient presents with  . Fatigue     (Consider location/radiation/quality/duration/timing/severity/associated sxs/prior Treatment) Patient is a 73 y.o. male presenting with dizziness.  Dizziness Quality:  Lightheadedness Severity:  Moderate Onset quality:  Gradual Duration:  3 days Timing:  Constant Progression:  Waxing and waning Chronicity:  New Context: bending over, physical activity and standing up   Context: not with loss of consciousness   Relieved by:  Being still Worsened by:  Nothing tried Ineffective treatments:  None tried Associated symptoms: nausea, shortness of breath and weakness (generalized)   Associated symptoms: no blood in stool, no chest pain, no diarrhea, no headaches, no syncope, no tinnitus and no vomiting   Risk factors: no new medications     Past Medical History  Diagnosis Date  . Ischemic cardiomyopathy      CABG 2003, PCI 2007  EF 27%(myoview 2012  . Chronic systolic heart failure   . Hyperlipidemia   . Hypothyroidism   . Chronic anticoagulation     Afib and LVAD  . Obesity   . COPD (chronic obstructive pulmonary disease)   . Asbestosis(501)     "6 years in the [place]" (05/24/2013)  . Atrial fibrillation     permanent  . Paroxysmal ventricular tachycardia   . Coronary artery disease   . Implantable cardioverter-defibrillator [employer]   . Hypertension   . Myocardial infarction 1990's-2000    "2 in  ~ the 1990's; 1 in ~ 2000" (05/24/2013)  . OSA on CPAP   . Depression   . LVAD (left ventricular assist device) present 12/2012   Past Surgical History  Procedure Laterality Date  . Coronary artery bypass graft  2003    "?X4" (05/24/2013)  . Cardiac defibrillator placement  2004; ~ 2010  . Insertion of implantable left ventricular assist device N/A 01/12/2013    Procedure: INSERTION OF IMPLANTABLE LEFT  VENTRICULAR ASSIST DEVICE;  Surgeon: Ivin Poot, MD;  Location: [place];  Service: Open Heart Surgery;  Laterality: N/A;   nitric oxide; Redo sternotomy  . Intraoperative transesophageal echocardiogram N/A 01/12/2013    Procedure: INTRAOPERATIVE TRANSESOPHAGEAL ECHOCARDIOGRAM;  Surgeon: Ivin Poot, MD;  Location: [place];  Service: Open Heart Surgery;  Laterality: N/A;  . Colonoscopy N/A 03/09/2014    Procedure: COLONOSCOPY;  Surgeon: Inda Castle, MD;  Location: [place];  Service: Endoscopy;  Laterality: N/A;  LVAD  patient  . Esophagogastroduodenoscopy N/A 03/09/2014    Procedure: ESOPHAGOGASTRODUODENOSCOPY (EGD);  Surgeon: Inda Castle, MD;  Location: [place];  Service: Endoscopy;  Laterality: N/A;  . Givens capsule study N/A 03/10/2014    Procedure: GIVENS CAPSULE STUDY;  Surgeon: Inda Castle, MD;  Location: [place];  Service: Endoscopy;  Laterality: N/A;  . Enteroscopy N/A 07/27/2014    Procedure: ENTEROSCOPY;  Surgeon: Gatha Mayer, MD;  Location: [facility];  Service: Endoscopy;  Laterality: N/A;  LVAD patient   . Colonoscopy N/A 07/29/2014    Procedure: COLONOSCOPY;  Surgeon: Gatha Mayer, MD;  Location: [place];  Service: Endoscopy;  Laterality: N/A;  . Givens capsule study N/A 07/30/2014    Procedure: GIVENS CAPSULE STUDY;  Surgeon: Gatha Mayer, MD;  Location: [place];  Service: Endoscopy;  Laterality: N/A;  . Esophagogastroduodenoscopy N/A 08/01/2014    Procedure: ESOPHAGOGASTRODUODENOSCOPY (EGD);  Surgeon: Jerene Bears, MD;  Location: [facility];  Service: Endoscopy;  Laterality: N/A;  LVAD patient  . Left and right heart catheterization with coronary/graft angiogram  01/07/2013    Procedure: LEFT AND RIGHT HEART CATHETERIZATION WITH Beatrix Fetters;  Surgeon: Jolaine Artist, MD;  Location: [facility] CATH LAB;  Service: Cardiovascular;;   Family History  Problem Relation Age of Onset  . Heart attack Mother   . Heart attack Father     History  Substance Use Topics  . Smoking status: Former Smoker -- 2.00 packs/day for 45 years    Types: Cigarettes    Quit date: 11/11/2001  . Smokeless tobacco: Never Used  . Alcohol Use: No     Comment: 05/24/2013 "use to drink beer; hardly nothing since 2003; once in awhile a beer "    Review of Systems  Constitutional: Positive for appetite change and fatigue. Negative for fever.  HENT: Negative for sore throat and tinnitus.   Eyes: Negative for visual disturbance.  Respiratory: Positive for shortness of breath.   Cardiovascular: Negative for chest pain and syncope.  Gastrointestinal: Positive for nausea. Negative for vomiting, abdominal pain, diarrhea and blood in stool.  Genitourinary: Positive for frequency. Negative for difficulty urinating.  Musculoskeletal: Negative for back pain and neck stiffness.  Skin: Negative for rash.  Neurological: Positive for dizziness and light-headedness. Negative for syncope and headaches.      Allergies  Review of patient's allergies indicates no known allergies.  Home Medications   Prior to Admission medications   Medication Sig Start Date End Date Taking? Authorizing Provider  albuterol (PROVENTIL) (2.5 MG/3ML) 0.083% nebulizer solution Take 2.5 mg by nebulization every 6 (six) hours as needed for wheezing or shortness of breath.     Historical Provider, MD  budesonide-formoterol (SYMBICORT) 160-4.5 MCG/ACT inhaler Inhale 2 puffs into the lungs 2 (two) times daily.    Historical Provider, MD  cholecalciferol (VITAMIN D) 1000 UNITS tablet Take 2,000 Units by mouth at bedtime.     Historical Provider, MD  citalopram (CELEXA) 40 MG tablet Take 40 mg by mouth at bedtime.    Historical Provider, MD  cyanocobalamin 1000 MCG tablet Take 1,000 mcg by mouth at bedtime.     Historical Provider, MD  docusate sodium (COLACE) 100 MG capsule Take 200 mg by mouth 2 (two) times daily. Take one tab in am and two tabs hs    Historical Provider, MD   ferrous gluconate (FERGON) 324 MG tablet Take 324 mg by mouth 3 (three) times daily with meals.    Historical Provider, MD  furosemide (LASIX) 40 MG tablet Take 40 mg by mouth daily as needed (fluid retention).    Historical Provider, MD  levothyroxine (SYNTHROID, LEVOTHROID) 100 MCG tablet Take 50 mcg by mouth at bedtime.     Historical Provider, MD  losartan (COZAAR) 50 MG tablet Take 0.5 tablets (25 mg total) by mouth daily. 08/04/14   Amy D Ninfa Meeker, NP  Multiple Vitamins-Minerals (MULTIVITAMIN PO) Take 1 tablet by mouth daily.    Historical Provider, MD  nitroGLYCERIN (NITROSTAT) 0.4 MG SL tablet Place 0.4 mg under the tongue every 5 (five) minutes as needed for chest pain.     Historical Provider, MD  pantoprazole (PROTONIX) 40 MG tablet Take 1 tablet (40 mg total) by mouth 2 (two) times daily. 03/11/14   Rande Brunt, NP  potassium chloride (K-DUR) 10 MEQ tablet Take 10 mEq by mouth daily as needed (when taking Furosemide).    Historical Provider, MD  simvastatin (ZOCOR) 80  MG tablet Take 40 mg by mouth at bedtime.      Historical Provider, MD  tiotropium (SPIRIVA) 18 MCG inhalation capsule Place 18 mcg into inhaler and inhale daily.     Historical Provider, MD  vitamin C (ASCORBIC ACID) 500 MG tablet Take 500 mg by mouth 2 (two) times daily.     Historical Provider, MD  warfarin (COUMADIN) 5 MG tablet Take 1-1.5 tablets (5-7.5 mg total) by mouth daily. 7.5mg  on  Monday-Wednesday and Friday. 5mg  all other days 08/15/14   Jolaine Artist, MD   BP   Pulse 61  Temp(Src) 97.6 F (36.4 C) (Oral)  Resp 26  Wt 225 lb (102.059 kg)  SpO2 97% Physical Exam  Constitutional: He is oriented to person, place, and time. He appears well-developed and well-nourished. No distress.  HENT:  Head: Normocephalic and atraumatic.  Eyes: EOM are normal.  Pale conjunctiva  Neck: Normal range of motion.  Cardiovascular:  LVAD in place, no pulse No heart sounds, hum of LVAD  Pulmonary/Chest: Effort normal  and breath sounds normal. No respiratory distress. He has no wheezes. He has no rales.  Abdominal: Soft. He exhibits no distension. There is no tenderness. There is no guarding.  Musculoskeletal: He exhibits no edema.  Neurological: He is alert and oriented to person, place, and time.  Skin: Skin is warm and dry. No rash noted. He is not diaphoretic.  Nursing note and vitals reviewed.   ED Course  Procedures (including critical care time) Labs Review Labs Reviewed  CBC - Abnormal; Notable for the following:    WBC 11.2 (*)    RBC 3.60 (*)    Hemoglobin 11.1 (*)    HCT 35.0 (*)    RDW 17.6 (*)    All other components within normal limits  BASIC METABOLIC PANEL - Abnormal; Notable for the following:    Sodium 134 (*)    Glucose, Bld 147 (*)    BUN 30 (*)    GFR calc non Af Amer 81 (*)    All other components within normal limits  PROTIME-INR - Abnormal; Notable for the following:    Prothrombin Time 30.3 (*)    INR 2.87 (*)    All other components within normal limits  LACTATE DEHYDROGENASE - Abnormal; Notable for the following:    LDH 275 (*)    All other components within normal limits  I-STAT TROPOININ, ED    Imaging Review No results found.   EKG Interpretation   Date/Time:  Wednesday September 21 2014 15:13:27 EST Ventricular Rate:  93 PR Interval:    QRS Duration: 84 QT Interval:  357 QTC Calculation: 444 R Axis:   -65 Text Interpretation:  Atrial fibrillation Left anterior fascicular block  Anteroseptal infarct, age indeterminate Artifact in lead(s) III V2 V3 V4  V5 V6 pt now in atrial fibrillation, new since prior Confirmed by Christy Gentles   MD, DONALD ([id number]) on 09/21/2014 3:16:29 PM      MDM   Final diagnoses:  Other fatigue  Atrial fibrillation, unspecified   73 year old male with history of ischemic cardiomyopathy with LVAD, CHF, hyperlipidemia, recent atrial fibrillation diagnosis presents with fatigue.  Cardiology and LVAD team at bedside on patient  arrival.  Doubt GI bleed given hx and pt with hemoglobin 11.1.  BMP without significant electrolyte abnormalities to account for symptoms.  No infectious symptoms, afebrile.  Troponin normal.  Patient with atrial fibrillation which may attribute to feelings of fatigue.  Cardioversion performed under  etomidate procedural sedation with return of sinus rhythm.  Patient returned to baseline without neurologic deficits and will follow up with Cardiology.  Patient discharged in stable condition with understanding of reasons to return.       Alvino Chapel, MD 09/22/14 [place], MD 09/22/14 [phone]

## 2014-09-21 NOTE — ED Notes (Signed)
VAD personal at bedside.

## 2014-09-21 NOTE — Telephone Encounter (Signed)
Pt's wife called VAD pager to report pt not feeling well and feels his hemoglobin has dropped. She is bringing patient to ED. ED charge nurse and Dr. Haroldine Laws notified. Asked charge nurse to page VAD coordinator when patient arrives.

## 2014-09-21 NOTE — Discharge Instructions (Signed)

## 2014-09-21 NOTE — CV Procedure (Signed)
     DIRECT CURRENT CARDIOVERSION  NAME:  [name]   MRN: [id number] DOB:  [birth year]   ADMIT DATE: 09/21/2014   INDICATIONS: Atrial fibrillation   Operators: Drs. Jamilee Lafosse, Schlossman (ER resident)   PROCEDURE:   Informed consent was obtained prior to the procedure. The risks, benefits and alternatives for the procedure were discussed and the patient comprehended these risks. Once an appropriate time out was taken, the patient had the defibrillator pads placed in the anterior and posterior position. The patient then underwent sedation by the Dr. Christy Gentles in th ER with 10mg  etomidate. Once an appropriate level of sedation was achieved, the patient received a single biphasic, synchronized 200J shock with prompt conversion to sinus rhythm. No apparent complications.   CONLCUSION:   1. Successful DC-CV of AF.  Benay Spice 4:58 PM

## 2014-09-21 NOTE — ED Provider Notes (Signed)
Decision has been made to cardiovert patient and d/c home Dr bensimhon to perform cardioversion, I supervised sedation  Procedural sedation Performed by: Sharyon Cable Consent: Verbal consent obtained. Risks and benefits: risks, benefits and alternatives were discussed Required items: requireddevices, and special equipment available Patient identity confirmed: arm band and provided demographic data Time out: Immediately prior to procedure a "time out" was called to verify the correct patient, procedure, equipment, support staff and site/side marked as required.  Sedation type: moderate (conscious) sedation NPO time confirmed and considedered  Sedatives: ETOMIDATE  Physician Time at Bedside: 17  Vitals: Vital signs were monitored during sedation. Cardiac Monitor, pulse oximeter Patient tolerance: Patient tolerated the procedure well with no immediate complications. Comments: Pt with uneventful recovered. Returned to pre-procedural sedation baseline   Sharyon Cable, MD 09/21/14 1705

## 2014-09-21 NOTE — ED Notes (Signed)
Dr. Sung Amabile and Dr. Christy Gentles at bedside preparing for cardioversion. MAP 84. Pt placed on pads, suction set up. Crash cart ready.

## 2014-09-25 ENCOUNTER — Emergency Department (HOSPITAL_COMMUNITY): Payer: Non-veteran care

## 2014-09-25 ENCOUNTER — Inpatient Hospital Stay (HOSPITAL_COMMUNITY)
Admission: EM | Admit: 2014-09-25 | Discharge: 2014-10-03 | DRG: 982 | Disposition: A | Discharge status: Home / Self Care | Payer: Non-veteran care | Attending: Cardiology | Admitting: Cardiology

## 2014-09-25 ENCOUNTER — Encounter (HOSPITAL_COMMUNITY): Payer: Self-pay | Admitting: Emergency Medicine

## 2014-09-25 DIAGNOSIS — I472 Ventricular tachycardia: Secondary | ICD-10-CM | POA: Diagnosis not present

## 2014-09-25 DIAGNOSIS — E039 Hypothyroidism, unspecified: Secondary | ICD-10-CM | POA: Diagnosis present

## 2014-09-25 DIAGNOSIS — Z955 Presence of coronary angioplasty implant and graft: Secondary | ICD-10-CM

## 2014-09-25 DIAGNOSIS — K921 Melena: Secondary | ICD-10-CM | POA: Diagnosis not present

## 2014-09-25 DIAGNOSIS — I252 Old myocardial infarction: Secondary | ICD-10-CM

## 2014-09-25 DIAGNOSIS — E785 Hyperlipidemia, unspecified: Secondary | ICD-10-CM | POA: Diagnosis present

## 2014-09-25 DIAGNOSIS — Z87891 Personal history of nicotine dependence: Secondary | ICD-10-CM | POA: Diagnosis not present

## 2014-09-25 DIAGNOSIS — Z951 Presence of aortocoronary bypass graft: Secondary | ICD-10-CM | POA: Diagnosis not present

## 2014-09-25 DIAGNOSIS — Z95811 Presence of heart assist device: Secondary | ICD-10-CM | POA: Diagnosis not present

## 2014-09-25 DIAGNOSIS — I482 Chronic atrial fibrillation: Secondary | ICD-10-CM | POA: Diagnosis not present

## 2014-09-25 DIAGNOSIS — I480 Paroxysmal atrial fibrillation: Secondary | ICD-10-CM | POA: Diagnosis present

## 2014-09-25 DIAGNOSIS — I959 Hypotension, unspecified: Secondary | ICD-10-CM | POA: Diagnosis present

## 2014-09-25 DIAGNOSIS — R040 Epistaxis: Secondary | ICD-10-CM | POA: Diagnosis not present

## 2014-09-25 DIAGNOSIS — I5022 Chronic systolic (congestive) heart failure: Secondary | ICD-10-CM | POA: Diagnosis present

## 2014-09-25 DIAGNOSIS — Z95810 Presence of automatic (implantable) cardiac defibrillator: Secondary | ICD-10-CM

## 2014-09-25 DIAGNOSIS — D62 Acute posthemorrhagic anemia: Principal | ICD-10-CM | POA: Insufficient documentation

## 2014-09-25 DIAGNOSIS — Z7901 Long term (current) use of anticoagulants: Secondary | ICD-10-CM | POA: Diagnosis not present

## 2014-09-25 DIAGNOSIS — J449 Chronic obstructive pulmonary disease, unspecified: Secondary | ICD-10-CM | POA: Diagnosis present

## 2014-09-25 DIAGNOSIS — F329 Major depressive disorder, single episode, unspecified: Secondary | ICD-10-CM | POA: Diagnosis present

## 2014-09-25 DIAGNOSIS — D72829 Elevated white blood cell count, unspecified: Secondary | ICD-10-CM | POA: Diagnosis not present

## 2014-09-25 DIAGNOSIS — Q2733 Arteriovenous malformation of digestive system vessel: Secondary | ICD-10-CM

## 2014-09-25 DIAGNOSIS — G4733 Obstructive sleep apnea (adult) (pediatric): Secondary | ICD-10-CM | POA: Diagnosis present

## 2014-09-25 DIAGNOSIS — I2510 Atherosclerotic heart disease of native coronary artery without angina pectoris: Secondary | ICD-10-CM | POA: Diagnosis present

## 2014-09-25 DIAGNOSIS — R0602 Shortness of breath: Secondary | ICD-10-CM

## 2014-09-25 DIAGNOSIS — I10 Essential (primary) hypertension: Secondary | ICD-10-CM | POA: Diagnosis present

## 2014-09-25 DIAGNOSIS — K922 Gastrointestinal hemorrhage, unspecified: Secondary | ICD-10-CM | POA: Diagnosis present

## 2014-09-25 DIAGNOSIS — I255 Ischemic cardiomyopathy: Secondary | ICD-10-CM | POA: Diagnosis present

## 2014-09-25 DIAGNOSIS — R0600 Dyspnea, unspecified: Secondary | ICD-10-CM

## 2014-09-25 DIAGNOSIS — I481 Persistent atrial fibrillation: Secondary | ICD-10-CM

## 2014-09-25 LAB — CBC WITH DIFFERENTIAL/PLATELET
BASOS ABS: 0 10*3/uL (ref 0.0–0.1)
Basophils Relative: 0 % (ref 0–1)
EOS ABS: 0.1 10*3/uL (ref 0.0–0.7)
Eosinophils Relative: 1 % (ref 0–5)
HEMATOCRIT: 23.6 % — AB (ref 39.0–52.0)
HEMOGLOBIN: 7.4 g/dL — AB (ref 13.0–17.0)
Lymphocytes Relative: 10 % — ABNORMAL LOW (ref 12–46)
Lymphs Abs: 1.3 10*3/uL (ref 0.7–4.0)
MCH: 31.9 pg (ref 26.0–34.0)
MCHC: 31.4 g/dL (ref 30.0–36.0)
MCV: 101.7 fL — ABNORMAL HIGH (ref 78.0–100.0)
Monocytes Absolute: 0.9 10*3/uL (ref 0.1–1.0)
Monocytes Relative: 7 % (ref 3–12)
NEUTROS PCT: 82 % — AB (ref 43–77)
Neutro Abs: 10.5 10*3/uL — ABNORMAL HIGH (ref 1.7–7.7)
Platelets: 198 10*3/uL (ref 150–400)
RBC: 2.32 MIL/uL — ABNORMAL LOW (ref 4.22–5.81)
RDW: 22.2 % — AB (ref 11.5–15.5)
WBC: 12.8 10*3/uL — AB (ref 4.0–10.5)

## 2014-09-25 LAB — COMPREHENSIVE METABOLIC PANEL
ALBUMIN: 3.2 g/dL — AB (ref 3.5–5.2)
ALK PHOS: 43 U/L (ref 39–117)
ALT: 17 U/L (ref 0–53)
AST: 32 U/L (ref 0–37)
Anion gap: 6 (ref 5–15)
BUN: 21 mg/dL (ref 6–23)
CALCIUM: 9 mg/dL (ref 8.4–10.5)
CO2: 21 mmol/L (ref 19–32)
Chloride: 104 mEq/L (ref 96–112)
Creatinine, Ser: 0.72 mg/dL (ref 0.50–1.35)
GFR calc Af Amer: >90 mL/min (ref >90)
GFR calc non Af Amer: >90 mL/min (ref >90)
Glucose, Bld: 145 mg/dL — ABNORMAL HIGH (ref 70–99)
POTASSIUM: 3.3 mmol/L — AB (ref 3.5–5.1)
Sodium: 131 mmol/L — ABNORMAL LOW (ref 135–145)
TOTAL PROTEIN: 5.6 g/dL — AB (ref 6.0–8.3)
Total Bilirubin: 0.8 mg/dL (ref 0.3–1.2)

## 2014-09-25 LAB — PROTIME-INR
INR: 4.04 — AB (ref 0.00–1.49)
PROTHROMBIN TIME: 39.6 s — AB (ref 11.6–15.2)

## 2014-09-25 LAB — PREPARE RBC (CROSSMATCH)

## 2014-09-25 LAB — TROPONIN I: Troponin I: <0.03 ng/mL (ref <0.031)

## 2014-09-25 LAB — MRSA PCR SCREENING: MRSA by PCR: NEGATIVE

## 2014-09-25 MED ORDER — SODIUM CHLORIDE 0.9 % IV SOLN
INTRAVENOUS | Status: DC
  Administered 2014-09-25: 15:00:00 via INTRAVENOUS
  Administered 2014-09-25: 250 mL via INTRAVENOUS

## 2014-09-25 MED ORDER — VITAMIN B-12 1000 MCG PO TABS
1000.0000 ug | ORAL_TABLET | Freq: Every day | ORAL | Status: DC
  Administered 2014-09-26 – 2014-10-02 (×7): 1000 ug via ORAL
  Filled 2014-09-25 (×9): qty 1

## 2014-09-25 MED ORDER — SODIUM CHLORIDE 0.9 % IV BOLUS (SEPSIS)
500.0000 mL | Freq: Once | INTRAVENOUS | Status: AC
  Administered 2014-09-25: 500 mL via INTRAVENOUS

## 2014-09-25 MED ORDER — FERROUS GLUCONATE 324 (38 FE) MG PO TABS
324.0000 mg | ORAL_TABLET | Freq: Three times a day (TID) | ORAL | Status: DC
  Administered 2014-09-26: 324 mg via ORAL
  Filled 2014-09-25 (×5): qty 1

## 2014-09-25 MED ORDER — LEVOTHYROXINE SODIUM 100 MCG PO TABS
100.0000 ug | ORAL_TABLET | Freq: Every day | ORAL | Status: DC
  Administered 2014-09-25 – 2014-10-02 (×8): 100 ug via ORAL
  Filled 2014-09-25 (×10): qty 1

## 2014-09-25 MED ORDER — SODIUM CHLORIDE 0.9 % IV SOLN: Freq: Once | INTRAVENOUS | Status: DC

## 2014-09-25 MED ORDER — DOCUSATE SODIUM 100 MG PO CAPS: 100.0000 mg | ORAL_CAPSULE | Freq: Two times a day (BID) | ORAL | Status: DC

## 2014-09-25 MED ORDER — TIOTROPIUM BROMIDE MONOHYDRATE 18 MCG IN CAPS
18.0000 ug | ORAL_CAPSULE | Freq: Every day | RESPIRATORY_TRACT | Status: DC
  Administered 2014-09-26 – 2014-10-03 (×8): 18 ug via RESPIRATORY_TRACT
  Filled 2014-09-25 (×5): qty 5

## 2014-09-25 MED ORDER — VITAMIN C 500 MG PO TABS
500.0000 mg | ORAL_TABLET | Freq: Two times a day (BID) | ORAL | Status: DC
  Administered 2014-09-25 – 2014-10-03 (×16): 500 mg via ORAL
  Filled 2014-09-25 (×19): qty 1

## 2014-09-25 MED ORDER — ATORVASTATIN CALCIUM 40 MG PO TABS
40.0000 mg | ORAL_TABLET | Freq: Every day | ORAL | Status: DC
  Administered 2014-09-26 – 2014-10-02 (×7): 40 mg via ORAL
  Filled 2014-09-25 (×9): qty 1

## 2014-09-25 MED ORDER — CITALOPRAM HYDROBROMIDE 40 MG PO TABS
40.0000 mg | ORAL_TABLET | Freq: Every day | ORAL | Status: DC
  Administered 2014-09-25: 40 mg via ORAL
  Filled 2014-09-25 (×2): qty 1

## 2014-09-25 MED ORDER — ALBUTEROL SULFATE (2.5 MG/3ML) 0.083% IN NEBU
2.5000 mg | INHALATION_SOLUTION | Freq: Four times a day (QID) | RESPIRATORY_TRACT | Status: DC | PRN
  Administered 2014-09-27: 2.5 mg via RESPIRATORY_TRACT
  Filled 2014-09-25 (×2): qty 3

## 2014-09-25 MED ORDER — ALBUTEROL SULFATE HFA 108 (90 BASE) MCG/ACT IN AERS: 2.0000 | INHALATION_SPRAY | Freq: Four times a day (QID) | RESPIRATORY_TRACT | Status: DC | PRN

## 2014-09-25 MED ORDER — SODIUM CHLORIDE 0.9 % IJ SOLN
3.0000 mL | Freq: Two times a day (BID) | INTRAMUSCULAR | Status: DC
  Administered 2014-09-25 – 2014-09-29 (×8): 3 mL via INTRAVENOUS
  Administered 2014-09-30: 10 mL via INTRAVENOUS
  Administered 2014-10-03: 3 mL via INTRAVENOUS

## 2014-09-25 MED ORDER — DOCUSATE SODIUM 100 MG PO CAPS
200.0000 mg | ORAL_CAPSULE | Freq: Every day | ORAL | Status: DC
  Administered 2014-09-25 – 2014-10-02 (×8): 200 mg via ORAL
  Filled 2014-09-25 (×9): qty 2

## 2014-09-25 MED ORDER — BUDESONIDE-FORMOTEROL FUMARATE 160-4.5 MCG/ACT IN AERO
2.0000 | INHALATION_SPRAY | Freq: Two times a day (BID) | RESPIRATORY_TRACT | Status: DC
  Administered 2014-09-25 – 2014-10-03 (×14): 2 via RESPIRATORY_TRACT
  Filled 2014-09-25 (×3): qty 6

## 2014-09-25 MED ORDER — PANTOPRAZOLE SODIUM 40 MG IV SOLR
40.0000 mg | Freq: Two times a day (BID) | INTRAVENOUS | Status: DC
  Administered 2014-09-25: 40 mg via INTRAVENOUS
  Filled 2014-09-25 (×3): qty 40

## 2014-09-25 MED ORDER — DOCUSATE SODIUM 100 MG PO CAPS
100.0000 mg | ORAL_CAPSULE | Freq: Every morning | ORAL | Status: DC
  Administered 2014-09-26 – 2014-10-03 (×8): 100 mg via ORAL
  Filled 2014-09-25 (×8): qty 1

## 2014-09-25 MED ORDER — SODIUM CHLORIDE 0.9 % IV SOLN: 250.0000 mL | INTRAVENOUS | Status: DC | PRN

## 2014-09-25 MED ORDER — ADULT MULTIVITAMIN LIQUID CH
5.0000 mL | Freq: Every day | ORAL | Status: DC
  Administered 2014-09-26 – 2014-10-01 (×6): 5 mL via ORAL
  Filled 2014-09-25 (×8): qty 5

## 2014-09-25 MED ORDER — POTASSIUM CHLORIDE CRYS ER 20 MEQ PO TBCR
40.0000 meq | EXTENDED_RELEASE_TABLET | Freq: Once | ORAL | Status: AC
  Administered 2014-09-25: 40 meq via ORAL
  Filled 2014-09-25: qty 2

## 2014-09-25 MED ORDER — SODIUM CHLORIDE 0.9 % IJ SOLN: 3.0000 mL | INTRAMUSCULAR | Status: DC | PRN

## 2014-09-25 NOTE — H&P (Signed)
Reason for Admission: Weakness/fatigue, anemia, atrial fibrillation.   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 Quit smoking 2003. Uses CPAP and O2 every night. LVAD implant 01/11/13 under Destination Therapy Criteria.   He has had issues with GIB and was last admitted in 06/2014 for GIB. Underwent push enteroscopy 07/27/14 which was negative and then colonoscopy 10/29 without source of bleeding. 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 Received a total of 10 PRBCs.   He developed weakness and dyspnea about 2 weeks ago.  He was seen in the ER on 12/23 and noted to be in atrial fibrillation.  His INR had been therapeutic for > 1 month, so he was cardioverted to NSR and sent home.  At that tijme, hemoglobin was 11.1.  He says that he never felt much better after this.  He was still very weak with no energy.  He was short of breath walking around his house though he is comfortable at rest.  He has been profoundly weak and fell twice since ER visit, once on 12/24 and again on 12/25.  Baseline weight is around 220, he has been at baseline.  He has had nausea but no vomiting.  Stool is chronically dark.  He takes iron supplement.    Given no improvement, he came to the ER today. He is back in atrial fibrillation with HR 80s-90s.  MAP initially in the 70s, down to 60s while in the ER.  Hemoglobin noted to be down to 7.4, which is a profound drop from prior.  INR 4.    LVAD INTERROGATION:  HeartMate II LVAD:  Flow 5.2 liters/min, speed 9200, power 5.6, PI 4.3.  1 PI event today, 3 PI events yesterday.   Review of Systems: All systems reviewed and negative except as per HPI.   Home Medications Prior to Admission medications   Medication Sig Start Date End Date Taking? Authorizing Provider    albuterol (PROVENTIL HFA;VENTOLIN HFA) 108 (90 BASE) MCG/ACT inhaler Inhale 2 puffs into the lungs every 6 (six) hours as needed for wheezing or shortness of breath.    Historical Provider, MD  albuterol (PROVENTIL) (2.5 MG/3ML) 0.083% nebulizer solution Take 2.5 mg by nebulization every 6 (six) hours as needed for wheezing or shortness of breath.     Historical Provider, MD  budesonide-formoterol (SYMBICORT) 160-4.5 MCG/ACT inhaler Inhale 2 puffs into the lungs 2 (two) times daily.    Historical Provider, MD  cholecalciferol (VITAMIN D) 1000 UNITS tablet Take 1,000 Units by mouth 2 (two) times daily.     Historical Provider, MD  citalopram (CELEXA) 40 MG tablet Take 40 mg by mouth at bedtime.    Historical Provider, MD  cyanocobalamin 1000 MCG tablet Take 1,000 mcg by mouth at bedtime.     Historical Provider, MD  docusate sodium (COLACE) 100 MG capsule Take 100-200 mg by mouth 2 (two) times daily. Take one tab in am and two tabs hs    Historical Provider, MD  ferrous gluconate (FERGON) 324 MG tablet Take 324 mg by mouth 3 (three) times daily with meals.    Historical Provider, MD  furosemide (LASIX) 40 MG tablet Take 40 mg by mouth daily as needed (fluid retention).    Historical Provider, MD  levothyroxine (SYNTHROID, LEVOTHROID) 100 MCG tablet Take  100 mcg by mouth at bedtime.     Historical Provider, MD  losartan (COZAAR) 50 MG tablet Take 0.5 tablets (25 mg total) by mouth daily. Patient taking differently: Take 50 mg by mouth daily.  08/04/14   Amy D Ninfa Meeker, NP  Multiple Vitamins-Minerals (MULTIVITAMIN PO) Take 1 tablet by mouth daily.    Historical Provider, MD  nitroGLYCERIN (NITROSTAT) 0.4 MG SL tablet Place 0.4 mg under the tongue every 5 (five) minutes as needed for chest pain.     Historical Provider, MD  pantoprazole (PROTONIX) 40 MG tablet Take 1 tablet (40 mg total) by mouth 2 (two) times daily. 03/11/14   Rande Brunt, NP  potassium chloride (K-DUR) 10 MEQ tablet Take 10 mEq by mouth  daily as needed (when taking Furosemide).    Historical Provider, MD  simvastatin (ZOCOR) 80 MG tablet Take 40 mg by mouth at bedtime.      Historical Provider, MD  tiotropium (SPIRIVA) 18 MCG inhalation capsule Place 18 mcg into inhaler and inhale daily.     Historical Provider, MD  vitamin C (ASCORBIC ACID) 500 MG tablet Take 500 mg by mouth 2 (two) times daily.     Historical Provider, MD  warfarin (COUMADIN) 5 MG tablet Take 1-1.5 tablets (5-7.5 mg total) by mouth daily. 7.5mg  on  Monday-Wednesday and Friday. 5mg  all other days 08/15/14   Jolaine Artist, MD    Past Medical History: Past Medical History  Diagnosis Date  . Ischemic cardiomyopathy      CABG 2003, PCI 2007  EF 27%(myoview 2012  . Chronic systolic heart failure   . Hyperlipidemia   . Hypothyroidism   . Chronic anticoagulation     Afib and LVAD  . Obesity   . COPD (chronic obstructive pulmonary disease)   . Asbestosis(501)     "6 years in the " (05/24/2013)  . Atrial fibrillation     permanent  . Paroxysmal ventricular tachycardia   . Coronary artery disease   . Implantable cardioverter-defibrillator Medtronic   . Hypertension   . Myocardial infarction 1990's-2000    "2 in  ~ the 1990's; 1 in ~ 2000" (05/24/2013)  . OSA on CPAP   . Depression   . LVAD (left ventricular assist device) present 12/2012    Past Surgical History: Past Surgical History  Procedure Laterality Date  . Coronary artery bypass graft  2003    "?X4" (05/24/2013)  . Cardiac defibrillator placement  2004; ~ 2010  . Insertion of implantable left ventricular assist device N/A 01/12/2013    Procedure: INSERTION OF IMPLANTABLE LEFT VENTRICULAR ASSIST DEVICE;  Surgeon: Ivin Poot, MD;  Location: [place];  Service: Open Heart Surgery;  Laterality: N/A;   nitric oxide; Redo sternotomy  . Intraoperative transesophageal echocardiogram N/A 01/12/2013    Procedure: INTRAOPERATIVE TRANSESOPHAGEAL ECHOCARDIOGRAM;  Surgeon: Ivin Poot, MD;   Location: [place];  Service: Open Heart Surgery;  Laterality: N/A;  . Colonoscopy N/A 03/09/2014    Procedure: COLONOSCOPY;  Surgeon: Inda Castle, MD;  Location: [place];  Service: Endoscopy;  Laterality: N/A;  LVAD  patient  . Esophagogastroduodenoscopy N/A 03/09/2014    Procedure: ESOPHAGOGASTRODUODENOSCOPY (EGD);  Surgeon: Inda Castle, MD;  Location: [place];  Service: Endoscopy;  Laterality: N/A;  . Givens capsule study N/A 03/10/2014    Procedure: GIVENS CAPSULE STUDY;  Surgeon: Inda Castle, MD;  Location: [place];  Service: Endoscopy;  Laterality: N/A;  . Enteroscopy N/A 07/27/2014    Procedure: ENTEROSCOPY;  Surgeon: Gatha Mayer, MD;  Location: [facility] ENDOSCOPY;  Service: Endoscopy;  Laterality: N/A;  LVAD patient   . Colonoscopy N/A 07/29/2014    Procedure: COLONOSCOPY;  Surgeon: Gatha Mayer, MD;  Location: [place];  Service: Endoscopy;  Laterality: N/A;  . Givens capsule study N/A 07/30/2014    Procedure: GIVENS CAPSULE STUDY;  Surgeon: Gatha Mayer, MD;  Location: [place];  Service: Endoscopy;  Laterality: N/A;  . Esophagogastroduodenoscopy N/A 08/01/2014    Procedure: ESOPHAGOGASTRODUODENOSCOPY (EGD);  Surgeon: Jerene Bears, MD;  Location: [facility] ENDOSCOPY;  Service: Endoscopy;  Laterality: N/A;  LVAD patient  . Left and right heart catheterization with coronary/graft angiogram  01/07/2013    Procedure: LEFT AND RIGHT HEART CATHETERIZATION WITH Beatrix Fetters;  Surgeon: Jolaine Artist, MD;  Location: [facility] CATH LAB;  Service: Cardiovascular;;    Family History: Family History  Problem Relation Age of Onset  . Heart attack Mother   . Heart attack Father     Social History: History   Social History  . Marital Status: Married    Spouse Name: N/A    Number of Children: N/A  . Years of Education: N/A   Social History Main Topics  . Smoking status: Former Smoker -- 2.00 packs/day for 45 years    Types: Cigarettes    Quit date:  11/11/2001  . Smokeless tobacco: Never Used  . Alcohol Use: No     Comment: 05/24/2013 "use to drink beer; hardly nothing since 2003; once in awhile a beer "  . Drug Use: No  . Sexual Activity: No   Other Topics Concern  . None   Social History Narrative    Allergies:  No Known Allergies  Objective:    Vital Signs:   Temp:  [97.8 F (36.6 C)] 97.8 F (36.6 C) (12/27 1356) Pulse Rate:  [86-106] 86 (12/27 1636) Resp:  [18-31] 18 (12/27 1636) SpO2:  [100 %] 100 % (12/27 1636)   There were no vitals filed for this visit.  Mean arterial Pressure 75 => 64  Physical Exam: General:  Well appearing. No resp difficulty HEENT: normal Neck: supple. JVP 8-9 cm. Carotids 2+ bilat; no bruits. No lymphadenopathy or thryomegaly appreciated. Cor: LVAD hum present. Lungs: clear Abdomen: soft, nontender, nondistended. No hepatosplenomegaly. No bruits or masses. Good bowel sounds. Driveline: C/D/I; securement device intact and driveline incorporated Extremities: no cyanosis, clubbing, rash, edema Neuro: alert & orientedx3, cranial nerves grossly intact. moves all 4 extremities w/o difficulty. Affect pleasant  Telemetry: atrial fibrillation, HR 90s  Labs: Basic Metabolic Panel:  Recent Labs Lab 09/21/14 1430 09/25/14 1415  NA 134* 131*  K 4.2 3.3*  CL 104 104  CO2 23 21  GLUCOSE 147* 145*  BUN 30* 21  CREATININE 0.96 0.72  CALCIUM 10.1 9.0    Liver Function Tests:  Recent Labs Lab 09/25/14 1415  AST 32  ALT 17  ALKPHOS 43  BILITOT 0.8  PROT 5.6*  ALBUMIN 3.2*   No results for input(s): LIPASE, AMYLASE in the last 168 hours. No results for input(s): AMMONIA in the last 168 hours.  CBC:  Recent Labs Lab 09/21/14 1430 09/25/14 1415  WBC 11.2* 12.8*  NEUTROABS  --  10.5*  HGB 11.1* 7.4*  HCT 35.0* 23.6*  MCV 97.2 101.7*  PLT 189 198    Cardiac Enzymes:  Recent Labs Lab 09/25/14 1415  TROPONINI <0.03    BNP: BNP (last 3 results)  Recent Labs   07/19/14 0830 07/23/14 1410  08/09/14 0953  PROBNP 296.4* 241.4* 478.1*    CBG: No results for input(s): GLUCAP in the last 168 hours.  Coagulation Studies:  Recent Labs  09/25/14 1415  LABPROT 39.6*  INR 4.04*    Other results: EKG: atrial fibrillation, rate 91.  Imaging: Dg Chest Port 1 View  09/25/2014   CLINICAL DATA:  Weakness and nausea, shortness of breath  EXAM: PORTABLE CHEST - 1 VIEW  COMPARISON:  07/29/2014  FINDINGS: And LVAD is again identified. The cardiac shadow remains enlarged. A defibrillator is again seen. Postsurgical changes are again noted. The lungs are well aerated with mild blunting of the costophrenic angles. No focal confluent infiltrate is seen. No sizable effusion is noted.  IMPRESSION: LVAD in appropriate position.  No acute abnormality is noted.   Electronically Signed   By: Inez Catalina M.D.   On: 09/25/2014 14:20         Assessment:   1) PAF - was in NSR 07/25/14.  In atrial fibrillation on 09/21/14 and cardioverted back to NSR.  Went back into atrial fibrillation some time after 12/23.  2) Weakness/Fatigue 3) Chronic systolic HF - s/p LVAD 10/9796 4) Anemia: Hemoglobin with drop from 11.1=>7.4 at this admission.  Concern for recurrent GI bleed.  5) HTN  Plan/Discussion:    I suspect symptoms are due to a combination of recurrence of atrial fibrillation (which he has historically tolerated poorly) and GI blood loss with symptomatic anemia.    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  He has noted black stool but had not been particularly worried about it because he is taking iron.  I suspect recurrent bleeding.  - Hold coumadin, will not actively reverse unless he is noted to have profuse bleeding.   - Transfuse 2 units PRBCs and will give 500 cc bolus with MAP in the 60s.   - Protonix 40 mg IV bid.  - Will consult GI in am, keep NPO after midnight for possible scope.  - He is already off aspirin, will likely  have to run INR in the 2-2.5 range in the future.   Atrial fibrillation has recurred not long after cardioversion on 12/23.  He has been therapeutic on warfarin so could be cardioverted again, but will hold off for now until GI bleeding issue has been sorted out.  Once BP is stable, I will start on amiodarone.  I think that he will need this to maintain NSR.   Hold losartan with soft BP.   I reviewed the LVAD parameters from today, and compared the results to the patient's prior recorded data.  No programming changes were made.  The LVAD is functioning within specified parameters.  The patient performs LVAD self-test daily.  LVAD interrogation was negative for any significant power changes, alarms or PI events/speed drops.  LVAD equipment check completed and is in good working order.  Back-up equipment present.   LVAD education done on emergency procedures and precautions and reviewed exit site care.  Length of Stay: 0  [name] 09/25/2014, 4:54 PM  VAD Team Pager [phone] (7am - 7am) +++VAD ISSUES ONLY+++   Advanced Heart Failure Team Pager [phone] (M-F; [place])  Please contact [facility] for night-coverage after hours (4p -7a ) and weekends on [email or web address] for all non- LVAD Issues

## 2014-09-25 NOTE — ED Provider Notes (Signed)
CSN: [id number]     Arrival date & time 09/25/14  1342 History   First MD Initiated Contact with Patient 09/25/14 1351     Chief Complaint  Patient presents with  . Shortness of Breath    The patient has been increasingly SOB  and has fallen twice in the last four days.  . Weakness     (Consider location/radiation/quality/duration/timing/severity/associated sxs/prior Treatment) HPI Comments: Patient here complaining of worsening dyspnea on exertion as well as weakness on exertion 2 days. Seen here a few days ago with a defibrillation and was treated with cardioversion and symptoms began after that. He denies any cough or congestion. States her symptoms only present when he ambulates and better with rest. No recent vomiting or diarrhea. No recent fever. No black or bloody stools. Called his doctor and was told to come here. No new treatments used prior to arrival  Patient is a 73 y.o. male presenting with shortness of breath and weakness. The history is provided by the patient, the spouse and medical records.  Shortness of Breath Weakness Associated symptoms include shortness of breath.    Past Medical History  Diagnosis Date  . Ischemic cardiomyopathy      CABG 2003, PCI 2007  EF 27%(myoview 2012  . Chronic systolic heart failure   . Hyperlipidemia   . Hypothyroidism   . Chronic anticoagulation     Afib and LVAD  . Obesity   . COPD (chronic obstructive pulmonary disease)   . Asbestosis(501)     "6 years in the [place]" (05/24/2013)  . Atrial fibrillation     permanent  . Paroxysmal ventricular tachycardia   . Coronary artery disease   . Implantable cardioverter-defibrillator Medtronic   . Hypertension   . Myocardial infarction 1990's-2000    "2 in  ~ the 1990's; 1 in ~ 2000" (05/24/2013)  . OSA on CPAP   . Depression   . LVAD (left ventricular assist device) present 12/2012   Past Surgical History  Procedure Laterality Date  . Coronary artery bypass graft  2003    "?X4"  (05/24/2013)  . Cardiac defibrillator placement  2004; ~ 2010  . Insertion of implantable left ventricular assist device N/A 01/12/2013    Procedure: INSERTION OF IMPLANTABLE LEFT VENTRICULAR ASSIST DEVICE;  Surgeon: Ivin Poot, MD;  Location: [place];  Service: Open Heart Surgery;  Laterality: N/A;   nitric oxide; Redo sternotomy  . Intraoperative transesophageal echocardiogram N/A 01/12/2013    Procedure: INTRAOPERATIVE TRANSESOPHAGEAL ECHOCARDIOGRAM;  Surgeon: Ivin Poot, MD;  Location: [place];  Service: Open Heart Surgery;  Laterality: N/A;  . Colonoscopy N/A 03/09/2014    Procedure: COLONOSCOPY;  Surgeon: Inda Castle, MD;  Location: [place];  Service: Endoscopy;  Laterality: N/A;  LVAD  patient  . Esophagogastroduodenoscopy N/A 03/09/2014    Procedure: ESOPHAGOGASTRODUODENOSCOPY (EGD);  Surgeon: Inda Castle, MD;  Location: [place];  Service: Endoscopy;  Laterality: N/A;  . Givens capsule study N/A 03/10/2014    Procedure: GIVENS CAPSULE STUDY;  Surgeon: Inda Castle, MD;  Location: [place];  Service: Endoscopy;  Laterality: N/A;  . Enteroscopy N/A 07/27/2014    Procedure: ENTEROSCOPY;  Surgeon: Gatha Mayer, MD;  Location: [place];  Service: Endoscopy;  Laterality: N/A;  LVAD patient   . Colonoscopy N/A 07/29/2014    Procedure: COLONOSCOPY;  Surgeon: Gatha Mayer, MD;  Location: [place];  Service: Endoscopy;  Laterality: N/A;  . Givens capsule study N/A 07/30/2014    Procedure:  GIVENS CAPSULE STUDY;  Surgeon: Gatha Mayer, MD;  Location: [place];  Service: Endoscopy;  Laterality: N/A;  . Esophagogastroduodenoscopy N/A 08/01/2014    Procedure: ESOPHAGOGASTRODUODENOSCOPY (EGD);  Surgeon: Jerene Bears, MD;  Location: [facility] ENDOSCOPY;  Service: Endoscopy;  Laterality: N/A;  LVAD patient  . Left and right heart catheterization with coronary/graft angiogram  01/07/2013    Procedure: LEFT AND RIGHT HEART CATHETERIZATION WITH Beatrix Fetters;   Surgeon: Jolaine Artist, MD;  Location: [facility] CATH LAB;  Service: Cardiovascular;;   Family History  Problem Relation Age of Onset  . Heart attack Mother   . Heart attack Father    History  Substance Use Topics  . Smoking status: Former Smoker -- 2.00 packs/day for 45 years    Types: Cigarettes    Quit date: 11/11/2001  . Smokeless tobacco: Never Used  . Alcohol Use: No     Comment: 05/24/2013 "use to drink beer; hardly nothing since 2003; once in awhile a beer "    Review of Systems  Respiratory: Positive for shortness of breath.   Neurological: Positive for weakness.  All other systems reviewed and are negative.     Allergies  Review of patient's allergies indicates no known allergies.  Home Medications   Prior to Admission medications   Medication Sig Start Date End Date Taking? Authorizing Provider  albuterol (PROVENTIL HFA;VENTOLIN HFA) 108 (90 BASE) MCG/ACT inhaler Inhale 2 puffs into the lungs every 6 (six) hours as needed for wheezing or shortness of breath.    Historical Provider, MD  albuterol (PROVENTIL) (2.5 MG/3ML) 0.083% nebulizer solution Take 2.5 mg by nebulization every 6 (six) hours as needed for wheezing or shortness of breath.     Historical Provider, MD  budesonide-formoterol (SYMBICORT) 160-4.5 MCG/ACT inhaler Inhale 2 puffs into the lungs 2 (two) times daily.    Historical Provider, MD  cholecalciferol (VITAMIN D) 1000 UNITS tablet Take 1,000 Units by mouth 2 (two) times daily.     Historical Provider, MD  citalopram (CELEXA) 40 MG tablet Take 40 mg by mouth at bedtime.    Historical Provider, MD  cyanocobalamin 1000 MCG tablet Take 1,000 mcg by mouth at bedtime.     Historical Provider, MD  docusate sodium (COLACE) 100 MG capsule Take 100-200 mg by mouth 2 (two) times daily. Take one tab in am and two tabs hs    Historical Provider, MD  ferrous gluconate (FERGON) 324 MG tablet Take 324 mg by mouth 3 (three) times daily with meals.    Historical Provider,  MD  furosemide (LASIX) 40 MG tablet Take 40 mg by mouth daily as needed (fluid retention).    Historical Provider, MD  levothyroxine (SYNTHROID, LEVOTHROID) 100 MCG tablet Take 100 mcg by mouth at bedtime.     Historical Provider, MD  losartan (COZAAR) 50 MG tablet Take 0.5 tablets (25 mg total) by mouth daily. Patient taking differently: Take 50 mg by mouth daily.  08/04/14   Amy D Ninfa Meeker, NP  Multiple Vitamins-Minerals (MULTIVITAMIN PO) Take 1 tablet by mouth daily.    Historical Provider, MD  nitroGLYCERIN (NITROSTAT) 0.4 MG SL tablet Place 0.4 mg under the tongue every 5 (five) minutes as needed for chest pain.     Historical Provider, MD  pantoprazole (PROTONIX) 40 MG tablet Take 1 tablet (40 mg total) by mouth 2 (two) times daily. 03/11/14   Rande Brunt, NP  potassium chloride (K-DUR) 10 MEQ tablet Take 10 mEq by mouth daily as needed (when taking  Furosemide).    Historical Provider, MD  simvastatin (ZOCOR) 80 MG tablet Take 40 mg by mouth at bedtime.      Historical Provider, MD  tiotropium (SPIRIVA) 18 MCG inhalation capsule Place 18 mcg into inhaler and inhale daily.     Historical Provider, MD  vitamin C (ASCORBIC ACID) 500 MG tablet Take 500 mg by mouth 2 (two) times daily.     Historical Provider, MD  warfarin (COUMADIN) 5 MG tablet Take 1-1.5 tablets (5-7.5 mg total) by mouth daily. 7.5mg  on  Monday-Wednesday and Friday. 5mg  all other days 08/15/14   Shaune Pascal Bensimhon, MD   BP   Pulse 106  Temp(Src) 97.8 F (36.6 C) (Oral)  Resp 31  SpO2 100% Physical Exam  Constitutional: He is oriented to person, place, and time. He appears well-developed and well-nourished.  Non-toxic appearance. No distress.  HENT:  Head: Normocephalic and atraumatic.  Eyes: Conjunctivae, EOM and lids are normal. Pupils are equal, round, and reactive to light.  Neck: Normal range of motion. Neck supple. No tracheal deviation present. No thyroid mass present.  Cardiovascular: Normal rate and normal heart  sounds.  An irregular rhythm present. Exam reveals no gallop.   No murmur heard. Pulmonary/Chest: Effort normal and breath sounds normal. No stridor. No respiratory distress. He has no decreased breath sounds. He has no wheezes. He has no rhonchi. He has no rales.  Abdominal: Soft. Normal appearance and bowel sounds are normal. He exhibits no distension. There is no tenderness. There is no rebound and no CVA tenderness.  Musculoskeletal: Normal range of motion. He exhibits no edema or tenderness.  Neurological: He is alert and oriented to person, place, and time. He has normal strength. No cranial nerve deficit or sensory deficit. GCS eye subscore is 4. GCS verbal subscore is 5. GCS motor subscore is 6.  Skin: Skin is warm and dry. No abrasion and no rash noted.  Psychiatric: He has a normal mood and affect. His speech is normal and behavior is normal.  Nursing note and vitals reviewed.   ED Course  Procedures (including critical care time) Labs Review Labs Reviewed  CBC WITH DIFFERENTIAL  COMPREHENSIVE METABOLIC PANEL  TROPONIN I  PROTIME-INR  TYPE AND SCREEN    Imaging Review No results found.   EKG Interpretation   Date/Time:  Sunday September 25 2014 14:17:46 EST Ventricular Rate:  91 PR Interval:    QRS Duration: 91 QT Interval:  377 QTC Calculation: 464 R Axis:   -30 Text Interpretation:  Atrial fibrillation Left axis deviation Nonspecific  repol abnormality, diffuse leads new from prior Confirmed by Giavonna Pflum  MD,  Ladrea Holladay ([id number]) on 09/25/2014 2:36:01 PM      MDM   Final diagnoses:  SOB (shortness of breath)    Patient has returned to be in atrial fibrillation. Had episode of rapid ventricular rate response here. Spoke with Dr. Algernon Huxley he will come and admit the patient    Leota Jacobsen, MD 09/25/14 [phone]

## 2014-09-25 NOTE — Progress Notes (Signed)
ANTICOAGULATION CONSULT NOTE - Initial Consult  Pharmacy Consult for Warfarin Indication: LVAD  No Known Allergies  Patient Measurements:    Ht: 73 inches Wt: 102.1 kg  Vital Signs: Temp: 98.7 F (37.1 C) (12/27 2000) Temp Source: Oral (12/27 2000) Pulse Rate: 87 (12/27 1935)  Labs:  Recent Labs  09/25/14 1415  HGB 7.4*  HCT 23.6*  PLT 198  LABPROT 39.6*  INR 4.04*  CREATININE 0.72  TROPONINI <0.03    Estimated Creatinine Clearance: 104.8 mL/min (by C-G formula based on Cr of 0.72).   Medical History: Past Medical History  Diagnosis Date  . Ischemic cardiomyopathy      CABG 2003, PCI 2007  EF 27%(myoview 2012  . Chronic systolic heart failure   . Hyperlipidemia   . Hypothyroidism   . Chronic anticoagulation     Afib and LVAD  . Obesity   . COPD (chronic obstructive pulmonary disease)   . Asbestosis(501)     "6 years in the [place]" (05/24/2013)  . Atrial fibrillation     permanent  . Paroxysmal ventricular tachycardia   . Coronary artery disease   . Implantable cardioverter-defibrillator Medtronic   . Hypertension   . Myocardial infarction 1990's-2000    "2 in  ~ the 1990's; 1 in ~ 2000" (05/24/2013)  . OSA on CPAP   . Depression   . LVAD (left ventricular assist device) present 12/2012    Assessment: 76 YOM s/p LVAD placement Apr '14 who presented to the ED on 12/23 and was found to be in Afib with Hgb 11.1 - cardioverted and sent home. At home pt continued to feel weak with falls on 12/24 and 12/25. Went back to the ED on 12/27 - still in Afib, Hgb down to 7.4, INR 4. 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 Holding ASA - new INR goal 2-2.5. LVAD stated to be functioning well. Pharmacy consulted to monitor warfarin dosing.  Goal of Therapy:  INR 2-2.5 (per Dr. Claris Gladden note on 12/27)    Plan:  1. Hold warfarin today 2. Daily PT/INR 3. Will continue to monitor for any signs/symptoms of bleeding and will follow up with PT/INR in the a.m.   Alycia Rossetti, PharmD, BCPS Clinical Pharmacist Pager: [phone] 09/25/2014 8:26 PM

## 2014-09-25 NOTE — ED Notes (Signed)
Cardiology at bedside.

## 2014-09-25 NOTE — ED Notes (Signed)
The patient has been increasingly SOB  and has fallen twice in the last four days.  According to the daughter he has been increasingly SOB and he advises his legs "give out on him".  He denies LOC but says he is extremely weak.  He does admit to having nausea but no vomiting.

## 2014-09-25 NOTE — ED Notes (Signed)
Rapid Response RN at bedside.  

## 2014-09-26 ENCOUNTER — Encounter (HOSPITAL_COMMUNITY): Payer: Medicare Other

## 2014-09-26 ENCOUNTER — Other Ambulatory Visit (HOSPITAL_COMMUNITY): Payer: Self-pay | Admitting: *Deleted

## 2014-09-26 DIAGNOSIS — D62 Acute posthemorrhagic anemia: Principal | ICD-10-CM

## 2014-09-26 LAB — CBC
HCT: 24 % — ABNORMAL LOW (ref 39.0–52.0)
HEMATOCRIT: 25.6 % — AB (ref 39.0–52.0)
Hemoglobin: 8 g/dL — ABNORMAL LOW (ref 13.0–17.0)
Hemoglobin: 7.4 g/dL — ABNORMAL LOW (ref 13.0–17.0)
MCH: 30.1 pg (ref 26.0–34.0)
MCH: 30.1 pg (ref 26.0–34.0)
MCHC: 31.3 g/dL (ref 30.0–36.0)
MCHC: 30.8 g/dL (ref 30.0–36.0)
MCV: 96.2 fL (ref 78.0–100.0)
MCV: 97.6 fL (ref 78.0–100.0)
PLATELETS: 154 10*3/uL (ref 150–400)
Platelets: 151 10*3/uL (ref 150–400)
RBC: 2.66 MIL/uL — ABNORMAL LOW (ref 4.22–5.81)
RBC: 2.46 MIL/uL — AB (ref 4.22–5.81)
RDW: 22.7 % — ABNORMAL HIGH (ref 11.5–15.5)
RDW: 23.5 % — ABNORMAL HIGH (ref 11.5–15.5)
WBC: 10.7 10*3/uL — ABNORMAL HIGH (ref 4.0–10.5)
WBC: 8.5 10*3/uL (ref 4.0–10.5)

## 2014-09-26 LAB — BASIC METABOLIC PANEL
Anion gap: 5 (ref 5–15)
BUN: 21 mg/dL (ref 6–23)
CO2: 21 mmol/L (ref 19–32)
Calcium: 8.9 mg/dL (ref 8.4–10.5)
Chloride: 107 mEq/L (ref 96–112)
Creatinine, Ser: 0.68 mg/dL (ref 0.50–1.35)
GFR calc Af Amer: >90 mL/min (ref >90)
GFR calc non Af Amer: >90 mL/min (ref >90)
GLUCOSE: 115 mg/dL — AB (ref 70–99)
POTASSIUM: 3.9 mmol/L (ref 3.5–5.1)
Sodium: 133 mmol/L — ABNORMAL LOW (ref 135–145)

## 2014-09-26 LAB — PREPARE RBC (CROSSMATCH)

## 2014-09-26 LAB — LACTATE DEHYDROGENASE: LDH: 229 U/L (ref 94–250)

## 2014-09-26 LAB — PROTIME-INR
INR: 3.1 — ABNORMAL HIGH (ref 0.00–1.49)
PROTHROMBIN TIME: 32.2 s — AB (ref 11.6–15.2)

## 2014-09-26 MED ORDER — CITALOPRAM HYDROBROMIDE 20 MG PO TABS
20.0000 mg | ORAL_TABLET | Freq: Every day | ORAL | Status: DC
  Administered 2014-09-26 – 2014-10-02 (×7): 20 mg via ORAL
  Filled 2014-09-26 (×8): qty 1

## 2014-09-26 MED ORDER — DIPHENHYDRAMINE HCL 25 MG PO CAPS
25.0000 mg | ORAL_CAPSULE | Freq: Once | ORAL | Status: AC
  Administered 2014-09-26: 25 mg via ORAL
  Filled 2014-09-26: qty 1

## 2014-09-26 MED ORDER — FUROSEMIDE 10 MG/ML IJ SOLN
20.0000 mg | Freq: Once | INTRAMUSCULAR | Status: AC
  Administered 2014-09-27: 20 mg via INTRAVENOUS
  Filled 2014-09-26: qty 2

## 2014-09-26 MED ORDER — SODIUM CHLORIDE 0.9 % IV SOLN
INTRAVENOUS | Status: DC
  Administered 2014-09-26: 17:00:00 via INTRAVENOUS

## 2014-09-26 MED ORDER — METOPROLOL TARTRATE 25 MG PO TABS
25.0000 mg | ORAL_TABLET | Freq: Two times a day (BID) | ORAL | Status: DC
  Filled 2014-09-26 (×2): qty 1

## 2014-09-26 MED ORDER — SODIUM CHLORIDE 0.9 % IV SOLN
Freq: Once | INTRAVENOUS | Status: AC
  Administered 2014-09-26: 21:00:00 via INTRAVENOUS

## 2014-09-26 MED ORDER — PANTOPRAZOLE SODIUM 40 MG PO TBEC
40.0000 mg | DELAYED_RELEASE_TABLET | Freq: Two times a day (BID) | ORAL | Status: DC
  Administered 2014-09-26 – 2014-10-03 (×15): 40 mg via ORAL
  Filled 2014-09-26 (×13): qty 1

## 2014-09-26 MED ORDER — AMIODARONE HCL 200 MG PO TABS
200.0000 mg | ORAL_TABLET | Freq: Two times a day (BID) | ORAL | Status: DC
  Administered 2014-09-26 – 2014-10-03 (×15): 200 mg via ORAL
  Filled 2014-09-26 (×16): qty 1

## 2014-09-26 MED ORDER — FUROSEMIDE 10 MG/ML IJ SOLN
20.0000 mg | Freq: Once | INTRAMUSCULAR | Status: AC
  Administered 2014-09-26: 20 mg via INTRAVENOUS
  Filled 2014-09-26: qty 2

## 2014-09-26 NOTE — Progress Notes (Signed)
ANTICOAGULATION CONSULT NOTE  Pharmacy Consult for Warfarin Indication: LVAD  No Known Allergies  Patient Measurements: Height: 6' (182.9 cm) Weight: 216 lb 8 oz (98.204 kg) IBW/kg (Calculated) : 77.6  Ht: 73 inches Wt: 102.1 kg  Vital Signs: Temp: 98.3 F (36.8 C) (12/28 0750) Temp Source: Oral (12/28 0750) Pulse Rate: 73 (12/28 1200)  Labs:  Recent Labs  09/25/14 1415 09/26/14 0238 09/26/14 1008  HGB 7.4* 8.0*  --   HCT 23.6* 25.6*  --   PLT 198 154  --   LABPROT 39.6*  --  32.2*  INR 4.04*  --  3.10*  CREATININE 0.72 0.68  --   TROPONINI <0.03  --   --     Estimated Creatinine Clearance: 101.3 mL/min (by C-G formula based on Cr of 0.68).   Medical History: Past Medical History  Diagnosis Date  . Ischemic cardiomyopathy      CABG 2003, PCI 2007  EF 27%(myoview 2012  . Chronic systolic heart failure   . Hyperlipidemia   . Hypothyroidism   . Chronic anticoagulation     Afib and LVAD  . Obesity   . COPD (chronic obstructive pulmonary disease)   . 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 Atrial fibrillation     permanent  . Paroxysmal ventricular tachycardia   . Coronary artery disease   . Implantable cardioverter-defibrillator Medtronic   . Hypertension   . Myocardial infarction 1990's-2000    "2 in  ~ the 1990's; 1 in ~ 2000" (05/24/2013)  . OSA on CPAP   . Depression   . LVAD (left ventricular assist device) present 12/2012    Assessment: 53 YOM s/p LVAD placement Apr '14 who presented to the ED on 12/23 and was found to be in Afib with Hgb 11.1 - cardioverted and sent home. At home pt continued to feel weak with falls on 12/24 and 12/25. Went back to the ED on 12/27 - still in Afib, Hgb down to 7.4, INR 4. 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 Holding ASA - new INR goal  2-2.5. LVAD stated to be functioning well. Pharmacy consulted to monitor warfarin dosing.  INR still elevated this am at 3.1, continue to hold warfarin and let INR drift down, plan is for enteroscopy tomorrow. Hgb 8 this morning after 2 units yesterday.  Goal of Therapy:  INR 2-2.5 (per Dr. Claris Gladden note on 12/27)   Plan:  1. Hold warfarin for now 2. Daily PT/INR 3. Will continue to monitor for any signs/symptoms of bleeding and will follow up with PT/INR in the a.m.   Erin Hearing PharmD., BCPS Clinical Pharmacist Pager [phone] 09/26/2014 12:38 PM

## 2014-09-26 NOTE — Care Management Note (Addendum)
    Page 1 of 1   09/28/2014     9:28:09 AM CARE MANAGEMENT NOTE 09/28/2014  Patient:  [name], [name]   Account Number:  [id number]  Date Initiated:  09/26/2014  Documentation initiated by:  Elissa Hefty  Subjective/Objective Assessment:   adm w gi bleed     Action/Plan:   lives w wife, pcp dr b Noah Delaine, has lvad   Anticipated DC Date:     Anticipated DC Plan:  HOME/SELF CARE         Choice offered to / List presented to:             Status of service:   Medicare Important Message given?  YES (If response is "NO", the following Medicare IM given date fields will be blank) Date Medicare IM given:  09/28/2014 Medicare IM given by:  Elissa Hefty Date Additional Medicare IM given:   Additional Medicare IM given by:    Discharge Disposition:  HOME/SELF CARE  Per UR Regulation:  Reviewed for med. necessity/level of care/duration of stay  If discussed at [place] of Stay Meetings, dates discussed:    Comments:

## 2014-09-26 NOTE — Progress Notes (Signed)
VAD coordinator to patients room. Pt awake, alert, on power module. Denies complaints while lying in bed. Telemetry reveals atrial fib.  No VAD alarms noted.  VAD parameters as noted:  Flow: 5.0 Speed:  9200 PI: 4.8 Power:  5.5 Fixed speed:  9200 Low speed limit: 8600 Alarms:  None Events:  Rare PI  VAD coordinator placed patient on batteries and transported him to 2H08. Pt c/o SOB after standing and sitting in w/c for few minutes. O2 sat 100% on room air in bed. Wife at bedside.

## 2014-09-26 NOTE — Progress Notes (Addendum)
Patient ID: [name], male   DOB: 17-Jun-1941, 73 y.o.   MRN: [id number]   SUBJECTIVE: Patient got 2 units PRBCs yesterday, hemoglobin up to 8 (not marked rise).  No BMs overnight.  Feels better.  He remains in atrial fibrillation with a few short runs of probable NSVT.  MAP 60s-70s. INR not back.   LVAD: flow 4.8, speed 9200 rpm, PI 4.3, power 5.2.  4 PI events on 12/27.   Scheduled Meds: . sodium chloride   Intravenous Once  . atorvastatin  40 mg Oral q1800  . budesonide-formoterol  2 puff Inhalation BID  . citalopram  40 mg Oral QHS  . docusate sodium  100 mg Oral q morning - 10a   And  . docusate sodium  200 mg Oral QHS  . ferrous gluconate  324 mg Oral TID WC  . levothyroxine  100 mcg Oral QHS  . metoprolol tartrate  25 mg Oral BID  . multivitamin  5 mL Oral Daily  . pantoprazole (PROTONIX) IV  40 mg Intravenous Q12H  . sodium chloride  3 mL Intravenous Q12H  . tiotropium  18 mcg Inhalation Daily  . cyanocobalamin  1,000 mcg Oral QHS  . vitamin C  500 mg Oral BID   Continuous Infusions: . sodium chloride 250 mL (09/25/14 2258)   PRN Meds:.sodium chloride, albuterol, sodium chloride    Filed Vitals:   09/25/14 2230 09/26/14 0005 09/26/14 0400 09/26/14 0600  Pulse: 80 67 74   Temp: 98.1 F (36.7 C) 98.1 F (36.7 C) 98.3 F (36.8 C)   TempSrc: Oral Oral Oral   Resp: 30 18    Height:    6' (1.829 m)  Weight:    216 lb 8 oz (98.204 kg)  SpO2: 100% 100%      Intake/Output Summary (Last 24 hours) at 09/26/14 0711 Last data filed at 09/26/14 0600  Gross per 24 hour  Intake   1835 ml  Output    925 ml  Net    910 ml    LABS: Basic Metabolic Panel:  Recent Labs  09/25/14 1415 09/26/14 0238  NA 131* 133*  K 3.3* 3.9  CL 104 107  CO2 21 21  GLUCOSE 145* 115*  BUN 21 21  CREATININE 0.72 0.68  CALCIUM 9.0 8.9   Liver Function Tests:  Recent Labs  09/25/14 1415  AST 32  ALT 17  ALKPHOS 43  BILITOT 0.8  PROT 5.6*  ALBUMIN 3.2*   No results  for input(s): LIPASE, AMYLASE in the last 72 hours. CBC:  Recent Labs  09/25/14 1415 09/26/14 0238  WBC 12.8* 10.7*  NEUTROABS 10.5*  --   HGB 7.4* 8.0*  HCT 23.6* 25.6*  MCV 101.7* 96.2  PLT 198 154   Cardiac Enzymes:  Recent Labs  09/25/14 1415  TROPONINI <0.03   BNP: Invalid input(s): POCBNP D-Dimer: No results for input(s): DDIMER in the last 72 hours. Hemoglobin A1C: No results for input(s): HGBA1C in the last 72 hours. Fasting Lipid Panel: No results for input(s): CHOL, HDL, LDLCALC, TRIG, CHOLHDL, LDLDIRECT in the last 72 hours. Thyroid Function Tests: No results for input(s): TSH, T4TOTAL, T3FREE, THYROIDAB in the last 72 hours.  Invalid input(s): FREET3 Anemia Panel: No results for input(s): VITAMINB12, FOLATE, FERRITIN, TIBC, IRON, RETICCTPCT in the last 72 hours.  RADIOLOGY: Dg Chest Port 1 View  09/25/2014   CLINICAL DATA:  Weakness and nausea, shortness of breath  EXAM: PORTABLE CHEST - 1 VIEW  COMPARISON:  07/29/2014  FINDINGS: And LVAD is again identified. The cardiac shadow remains enlarged. A defibrillator is again seen. Postsurgical changes are again noted. The lungs are well aerated with mild blunting of the costophrenic angles. No focal confluent infiltrate is seen. No sizable effusion is noted.  IMPRESSION: LVAD in appropriate position.  No acute abnormality is noted.   Electronically Signed   By: Inez Catalina M.D.   On: 09/25/2014 14:20    Physical Exam: General: Well appearing. No resp difficulty HEENT: normal Neck: supple. JVP 8 cm. Carotids 2+ bilat; no bruits. No lymphadenopathy or thryomegaly appreciated. Cor: LVAD hum present. Lungs: clear Abdomen: soft, nontender, nondistended. No hepatosplenomegaly. No bruits or masses. Good bowel sounds. Driveline: C/D/I; securement device intact and driveline incorporated Extremities: no cyanosis, clubbing, rash, edema Neuro: alert & orientedx3, cranial nerves grossly intact. moves all 4 extremities  w/o difficulty. Affect pleasant  TELEMETRY: Reviewed telemetry pt in atrial fibrillation, HR 70s    Assessment:   1) PAF - was in NSR 07/25/14. In atrial fibrillation on 09/21/14 and cardioverted back to NSR. Went back into atrial fibrillation some time after 12/23.  2) Weakness/Fatigue 3) Chronic systolic HF - s/p LVAD 10/6107 4) Anemia: Hemoglobin with drop from 11.1=>7.4 at this admission. Concern for recurrent GI bleed.  5) HTN 6) NSVT  Plan/Discussion:    I suspect symptoms are due to a combination of recurrence of atrial fibrillation (which he has historically tolerated poorly) and GI blood loss with symptomatic anemia.   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 He has noted black stool but had not been particularly worried about it because he is taking iron. I suspect recurrent bleeding. He had 2 units PRBCs on 12/27.  - Hold coumadin, will not actively reverse unless he is noted to have profuse bleeding. So far, no overt GI bleeding noted (no BMs). Pending this am's INR - Transfuse 1 unit PRBCs this morning and repeat CBC in afternoon.  - Protonix 40 mg IV bid.  - Will consult GI, he is NPO for possible scope.  - He is already off aspirin, will likely have to run INR in the 2-2.5 range in the future.   Atrial fibrillation has recurred not long after cardioversion on 12/23. He has been therapeutic on warfarin so could be cardioverted again, but will hold off for now until GI bleeding issue has been sorted out. I will start amiodarone today. I think that he will need this to maintain NSR.   Hold losartan with soft BP.   I reviewed the LVAD parameters from today, and compared the results to the patient's prior recorded data. No programming changes were made. The LVAD is functioning within specified parameters. The patient performs LVAD self-test daily. LVAD interrogation was negative for any significant power changes, alarms or  PI events/speed drops. LVAD equipment check completed and is in good working order. Back-up equipment present. LVAD education done on emergency procedures and precautions and reviewed exit site care.  Loralie Champagne 09/25/2014, 4:54 PM  VAD Team Pager [phone] (7am - 7am) +++VAD ISSUES ONLY+++   Advanced Heart Failure Team Pager [phone] (M-F; [place])  Please contact [facility] for night-coverage after hours (4p -7a ) and weekends on [email or web address] for all non- LVAD Issues      Routing History

## 2014-09-26 NOTE — Consult Note (Signed)
[facility] Consult: 9:55 AM 09/26/2014  LOS: 1 day    Referring Provider: Bensimhon Primary Care Physician:  Thressa Sheller, MD Primary Gastroenterologist:  Dr. Deatra Ina.  All encounters with various GI docs have been inpt.      Reason for Consultation:  Recurrent anemia.     HPI: [name] is a 73 y.o. male.  PMH CAD s/p CABG (0802), chronic systolic HF s/p ICD, hyperlipidemia, hypothyroidism, emphysema, OSA and PAF. Quit smoking 2003. Uses CPAP and O2 every night. LVAD (destination) implant 01/11/13.  Hx Afib; DCCV on 12/23. 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 CT abdomen of 02/2014 showed cirrhosis. Chronic Coumadin.   Recurrent issues with UGI bleeding. Required 2 PRBCs in 08/2013, 2 PRBCs 02/2014, 10 units PRBCs in 10- 07/2014. On TID po Iron.  . EGD and colonoscopy 03/09/2014. Below:6 mm polyp clipped and removed, internal rrhoids, mild Above: erosions at [employer].   Capsule endo 03/10/2014:  Gastric and small bowel angioectasia, inflammation at 2 hours 40 minutes Colonoscopy 07/29/14.    ENDOSCOPIC IMPRESSION: 1. Two sessile polyps ranging from 3 to 34mm in size were found in the right colon 2. The colon examination was otherwise normal with fair prep after extensive irrigation and suctioning 3. Internal hemorrhoids in rectum Enteroscopy 07/27/14.  IMPRESSIONS: Irregular Z-line and small hiatal hernia but otherwise normal esophagus, stomach, duodenum and proximal jejunum Capsule endo 07/30/2014: bleeding in duodenum.  Melenic fluid distal to this.   Enteroscopy 08/01/14. 1. The mucosa of the esophagus appeared normal 2. Small hiatal hernia 3. The mucosa of the stomach appeared normal 4. 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 Otherwise normal duodenum 6. The exam showed no abnormalities in the examined portions of the proximal jejunum  +++++++++++++++++++++++++++++++++++++++++++++++++++++++++++++++++++++++++++++++++++++     Admitted yesterday with dizziness, fatigue, weakness, minimal SOB.  Hgb has gone from 11.1 in 12/23 to 7.4 12/27.  8.0 today after 2 units PRBCs. INR 12/27 was 4.0. Goal is 1.8-2.5.    Stools are dark due to po Iron. Has not seen maroon nor bloody stools. Has formed BMs about every other day. Uses a stool softener daily. In the last 7-10 days his appetite has been decreased. He notes some queasiness when he gets short of breath which began post the cardioversion on the 23rd.   Past Medical History  Diagnosis Date  . Ischemic cardiomyopathy      CABG 2003, PCI 2007  EF 27%(myoview 2012  . Chronic systolic heart failure   . Hyperlipidemia   . Hypothyroidism   . Chronic anticoagulation     Afib and LVAD  . Obesity   . COPD (chronic obstructive pulmonary disease)   . Asbestosis(501)     "6 years in the [place]" (05/24/2013)  . Atrial fibrillation     permanent  . Paroxysmal ventricular tachycardia   . Coronary artery disease   . Implantable cardioverter-defibrillator Medtronic   .  Hypertension   . Myocardial infarction 1990's-2000    "2 in  ~ the 1990's; 1 in ~ 2000" (05/24/2013)  . OSA on CPAP   . Depression   . LVAD (left ventricular assist device) present 12/2012    Past Surgical History  Procedure Laterality Date  . Coronary artery bypass graft  2003    "?X4" (05/24/2013)  . Cardiac defibrillator placement  2004; ~ 2010  . Insertion of implantable left ventricular assist device N/A 01/12/2013    Procedure: INSERTION OF IMPLANTABLE LEFT VENTRICULAR ASSIST DEVICE;  Surgeon: Ivin Poot, MD;  Location: [place];  Service: Open Heart Surgery;  Laterality: N/A;    nitric oxide; Redo sternotomy  . Intraoperative transesophageal echocardiogram N/A 01/12/2013    Procedure: INTRAOPERATIVE TRANSESOPHAGEAL ECHOCARDIOGRAM;  Surgeon: Ivin Poot, MD;  Location: [place];  Service: Open Heart Surgery;  Laterality: N/A;  . Colonoscopy N/A 03/09/2014    Procedure: COLONOSCOPY;  Surgeon: Inda Castle, MD;  Location: [place];  Service: Endoscopy;  Laterality: N/A;  LVAD  patient  . Esophagogastroduodenoscopy N/A 03/09/2014    Procedure: ESOPHAGOGASTRODUODENOSCOPY (EGD);  Surgeon: Inda Castle, MD;  Location: [place];  Service: Endoscopy;  Laterality: N/A;  . Givens capsule study N/A 03/10/2014    Procedure: GIVENS CAPSULE STUDY;  Surgeon: Inda Castle, MD;  Location: [place];  Service: Endoscopy;  Laterality: N/A;  . Enteroscopy N/A 07/27/2014    Procedure: ENTEROSCOPY;  Surgeon: Gatha Mayer, MD;  Location: [place];  Service: Endoscopy;  Laterality: N/A;  LVAD patient   . Colonoscopy N/A 07/29/2014    Procedure: COLONOSCOPY;  Surgeon: Gatha Mayer, MD;  Location: [place];  Service: Endoscopy;  Laterality: N/A;  . Givens capsule study N/A 07/30/2014    Procedure: GIVENS CAPSULE STUDY;  Surgeon: Gatha Mayer, MD;  Location: [place];  Service: Endoscopy;  Laterality: N/A;  . Esophagogastroduodenoscopy N/A 08/01/2014    Procedure: ESOPHAGOGASTRODUODENOSCOPY (EGD);  Surgeon: Jerene Bears, MD;  Location: [facility] ENDOSCOPY;  Service: Endoscopy;  Laterality: N/A;  LVAD patient  . Left and right heart catheterization with coronary/graft angiogram  01/07/2013    Procedure: LEFT AND RIGHT HEART CATHETERIZATION WITH Beatrix Fetters;  Surgeon: Jolaine Artist, MD;  Location: [facility] CATH LAB;  Service: Cardiovascular;;    Prior to Admission medications   Medication Sig Start Date End Date Taking? Authorizing Provider  budesonide-formoterol (SYMBICORT) 160-4.5 MCG/ACT inhaler Inhale 2 puffs into the lungs 2 (two) times daily.   Yes  Historical Provider, MD  cholecalciferol (VITAMIN D) 1000 UNITS tablet Take 1,000 Units by mouth 2 (two) times daily.    Yes Historical Provider, MD  citalopram (CELEXA) 40 MG tablet Take 40 mg by mouth at bedtime.   Yes Historical Provider, MD  cyanocobalamin 1000 MCG tablet Take 1,000 mcg by mouth daily.    Yes Historical Provider, MD  docusate sodium (COLACE) 100 MG capsule Take 100-200 mg by mouth 2 (two) times daily. Take one tab in am and two tabs hs   Yes Historical Provider, MD  ferrous gluconate (FERGON) 324 MG tablet Take 324 mg by mouth 3 (three) times daily with meals.   Yes Historical Provider, MD  furosemide (LASIX) 40 MG tablet Take 40 mg by mouth daily as needed (fluid retention).   Yes Historical Provider, MD  levothyroxine (SYNTHROID, LEVOTHROID) 100 MCG tablet Take 100 mcg by mouth at bedtime.    Yes Historical Provider, MD  losartan (COZAAR) 50 MG tablet Take 0.5 tablets (  25 mg total) by mouth daily. Patient taking differently: Take 25 mg by mouth at bedtime.  08/04/14  Yes Amy D Clegg, NP  Multiple Vitamins-Minerals (MULTIVITAMIN PO) Take 1 tablet by mouth daily.   Yes Historical Provider, MD  pantoprazole (PROTONIX) 40 MG tablet Take 1 tablet (40 mg total) by mouth 2 (two) times daily. 03/11/14  Yes Rande Brunt, NP  simvastatin (ZOCOR) 80 MG tablet Take 40 mg by mouth at bedtime.     Yes Historical Provider, MD  tiotropium (SPIRIVA) 18 MCG inhalation capsule Place 18 mcg into inhaler and inhale daily.    Yes Historical Provider, MD  vitamin C (ASCORBIC ACID) 500 MG tablet Take 500 mg by mouth 2 (two) times daily.    Yes Historical Provider, MD  warfarin (COUMADIN) 5 MG tablet Take 1-1.5 tablets (5-7.5 mg total) by mouth daily. 7.5mg  on  Monday-Wednesday and Friday. 5mg  all other days Patient taking differently: Take 5-7.5 mg by mouth at bedtime. 7.5mg  on  Monday-Wednesday and Friday. 5mg  all other days 08/15/14  Yes Jolaine Artist, MD  albuterol (PROVENTIL HFA;VENTOLIN  HFA) 108 (90 BASE) MCG/ACT inhaler Inhale 2 puffs into the lungs every 6 (six) hours as needed for wheezing or shortness of breath.    Historical Provider, MD  albuterol (PROVENTIL) (2.5 MG/3ML) 0.083% nebulizer solution Take 2.5 mg by nebulization every 6 (six) hours as needed for wheezing or shortness of breath.     Historical Provider, MD  nitroGLYCERIN (NITROSTAT) 0.4 MG SL tablet Place 0.4 mg under the tongue every 5 (five) minutes as needed for chest pain.     Historical Provider, MD  potassium chloride (K-DUR) 10 MEQ tablet Take 10 mEq by mouth daily as needed (when taking Furosemide).    Historical Provider, MD    Scheduled Meds: . sodium chloride   Intravenous Once  . amiodarone  200 mg Oral BID  . atorvastatin  40 mg Oral q1800  . budesonide-formoterol  2 puff Inhalation BID  . citalopram  40 mg Oral QHS  . docusate sodium  100 mg Oral q morning - 10a   And  . docusate sodium  200 mg Oral QHS  . ferrous gluconate  324 mg Oral TID WC  . levothyroxine  100 mcg Oral QHS  . multivitamin  5 mL Oral Daily  . pantoprazole (PROTONIX) IV  40 mg Intravenous Q12H  . sodium chloride  3 mL Intravenous Q12H  . tiotropium  18 mcg Inhalation Daily  . cyanocobalamin  1,000 mcg Oral QHS  . vitamin C  500 mg Oral BID   Infusions: . sodium chloride 250 mL (09/25/14 2258)   PRN Meds: sodium chloride, albuterol, sodium chloride   Allergies as of 09/25/2014  . (No Known Allergies)    Family History  Problem Relation Age of Onset  . Heart attack Mother   . Heart attack Father     History   Social History  . Marital Status: Married    Spouse Name: N/A    Number of Children: N/A  . Years of Education: N/A   Occupational History  . Not on file.   Social History Main Topics  . Smoking status: Former Smoker -- 2.00 packs/day for 45 years    Types: Cigarettes    Quit date: 11/11/2001  . Smokeless tobacco: Never Used  . Alcohol Use: No     Comment: 05/24/2013 "use to drink beer;  hardly nothing since 2003; once in awhile a beer "  .  Drug Use: No  . Sexual Activity: No   Other Topics Concern  . Not on file   Social History Narrative    REVIEW OF SYSTEMS: Constitutional:  Weight is down 9 pounds in the last 5 days according to recent measurements ENT:  No nose bleeds Pulm:  Dyspnea with minor activity, is improved following transfusions CV:  No palpitations, no LE edema.  GU:  No hematuria, no frequency GI:  Per history of present illness Heme:  Per history of present illness   Transfusions:  Her history of present illness Neuro:  No headaches, no peripheral tingling or numbness Derm:  No itching, no rash or sores.  Endocrine:  No sweats or chills.  No polyuria or dysuria Immunization:  Per his wife, the patient's flu, Pneumovax immunizations are up-to-date. These are performed at the [facility] clinic. Travel:  None beyond local counties in last few months.    PHYSICAL EXAM: Vital signs in last 24 hours: Filed Vitals:   09/26/14 0750  Pulse:   Temp: 98.3 F (36.8 C)  Resp: 26   Wt Readings from Last 3 Encounters:  09/26/14 216 lb 8 oz (98.204 kg)  09/21/14 225 lb (102.059 kg)  08/09/14 225 lb (102.059 kg)   General: Pleasant, pale, somewhat chronically ill but not acutely ill-appearing Head:  No asymmetry, swelling or signs of trauma  Eyes:  No icterus, no conjunctival pallor. Ears:  Slight hearing deficit, not significant  Nose:  No congestion or discharge Mouth:  Clear, moist oral mucosa Neck:  No mass, JVP at 8 cm. Bilateral carotid bruits.  No TMG Lungs:  Clear to auscultation and percussion bilaterally. Not dyspneic. No cough Heart: LVAD hum present. Abdomen:  Soft, nontender, nondistended. Bowel sounds active. No organomegaly. Driveline of LVAD site on left mid abdomen without signs of infection..   Rectal: Deferred   Musc/Skeltl: No joint contractures, swelling or erythema Extremities:  No CCE.  Neurologic:  Oriented 3. No tremor, no limb  weakness or asymmetry. Skin:  No sores, no rashes. Tattoos:  Old tattoos on arms Nodes:  No cervical adenopathy   Psych:  Pleasant, cooperative, in good spirits.  Intake/Output from previous day: 12/27 0701 - 12/28 0700 In: 1845 [P.O.:250; I.V.:590; Blood:1005] Out: 925 [Urine:925] Intake/Output this shift:    LAB RESULTS:  Recent Labs  09/25/14 1415 09/26/14 0238  WBC 12.8* 10.7*  HGB 7.4* 8.0*  HCT 23.6* 25.6*  PLT 198 154   BMET Lab Results  Component Value Date   NA 133* 09/26/2014   NA 131* 09/25/2014   NA 134* 09/21/2014   K 3.9 09/26/2014   K 3.3* 09/25/2014   K 4.2 09/21/2014   CL 107 09/26/2014   CL 104 09/25/2014   CL 104 09/21/2014   CO2 21 09/26/2014   CO2 21 09/25/2014   CO2 23 09/21/2014   GLUCOSE 115* 09/26/2014   GLUCOSE 145* 09/25/2014   GLUCOSE 147* 09/21/2014   BUN 21 09/26/2014   BUN 21 09/25/2014   BUN 30* 09/21/2014   CREATININE 0.68 09/26/2014   CREATININE 0.72 09/25/2014   CREATININE 0.96 09/21/2014   CALCIUM 8.9 09/26/2014   CALCIUM 9.0 09/25/2014   CALCIUM 10.1 09/21/2014   LFT  Recent Labs  09/25/14 1415  PROT 5.6*  ALBUMIN 3.2*  AST 32  ALT 17  ALKPHOS 43  BILITOT 0.8   PT/INR Lab Results  Component Value Date   INR 4.04* 09/25/2014   INR 2.87* 09/21/2014   INR 2.5* 09/06/2014  RADIOLOGY STUDIES: Dg Chest Port 1 View  09/25/2014   CLINICAL DATA:  Weakness and nausea, shortness of breath  EXAM: PORTABLE CHEST - 1 VIEW  COMPARISON:  07/29/2014  FINDINGS: And LVAD is again identified. The cardiac shadow remains enlarged. A defibrillator is again seen. Postsurgical changes are again noted. The lungs are well aerated with mild blunting of the costophrenic angles. No focal confluent infiltrate is seen. No sizable effusion is noted.  IMPRESSION: LVAD in appropriate position.  No acute abnormality is noted.   Electronically Signed   By: Inez Catalina M.D.   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  Multiple endoscopic workups as above. S/p 2 PRBCs.   *  S/p LVAD  *  Chronic Coumadin. INR above target range.   *  Status post DCCV of A. fib on 09/21/14.  Per telemetry monitor, the current rate is sinus.     PLAN:     *  Arrange enteroscopy. This is probably going to need to be done tomorrow as coordinating with anesthesia and LVAD RN/APP assistance as well as allowing INR 2 decline will require at least another 18 hours.. At present enteroscopy is set up for 1330 on Tuesday 09/27/29  *  I switched his IV Protonix to by mouth twice daily.  *  Recheck coags, hgb in the morning.  Stool for FOBT has been ordered. Defer any further transfusion decisions to the cardiologist  *  Allow solid food today.     Azucena Freed  09/26/2014, 9:55 AM Pager: [phone]   [facility] Attending  I have also seen and assessed the patient and agree with the above note. New decline in Hgb that is most likely GI bleeding. Plan for enteroscopy tomorrow as long as INR not too high.  The risks and benefits as well as alternatives of endoscopic procedure(s) have been discussed and reviewed. All questions answered. The patient agrees to proceed.  Gatha Mayer, MD, [facility]

## 2014-09-26 NOTE — Plan of Care (Signed)
Problem: Phase I Progression Outcomes Goal: OOB as tolerated unless otherwise ordered Outcome: Not Met (add Reason) MD order bed rest

## 2014-09-26 NOTE — Progress Notes (Signed)
Report called to Lianne Bushy, RN on [place]. Paged Cloyde Reams, [place] coordinator to assist with transfer. Pt alert and oriented, VSS, no c/o pain. Transferring to 2H08.

## 2014-09-27 ENCOUNTER — Inpatient Hospital Stay (HOSPITAL_COMMUNITY): Payer: Non-veteran care | Admitting: Anesthesiology

## 2014-09-27 ENCOUNTER — Encounter (HOSPITAL_COMMUNITY): Payer: Self-pay | Admitting: *Deleted

## 2014-09-27 ENCOUNTER — Encounter (HOSPITAL_COMMUNITY)
Admission: EM | Disposition: A | Discharge status: Home / Self Care | Payer: Self-pay | Source: Home / Self Care | Attending: Cardiology

## 2014-09-27 DIAGNOSIS — I480 Paroxysmal atrial fibrillation: Secondary | ICD-10-CM

## 2014-09-27 DIAGNOSIS — K921 Melena: Secondary | ICD-10-CM

## 2014-09-27 HISTORY — PX: ENTEROSCOPY: SHX5533

## 2014-09-27 LAB — OCCULT BLOOD X 1 CARD TO LAB, STOOL: Fecal Occult Bld: POSITIVE — AB

## 2014-09-27 LAB — BASIC METABOLIC PANEL
ANION GAP: 8 (ref 5–15)
BUN: 18 mg/dL (ref 6–23)
CHLORIDE: 98 meq/L (ref 96–112)
CO2: 24 mmol/L (ref 19–32)
CREATININE: 0.81 mg/dL (ref 0.50–1.35)
Calcium: 9 mg/dL (ref 8.4–10.5)
GFR calc Af Amer: >90 mL/min (ref >90)
GFR calc non Af Amer: 87 mL/min — ABNORMAL LOW (ref >90)
Glucose, Bld: 136 mg/dL — ABNORMAL HIGH (ref 70–99)
POTASSIUM: 3.7 mmol/L (ref 3.5–5.1)
Sodium: 130 mmol/L — ABNORMAL LOW (ref 135–145)

## 2014-09-27 LAB — CBC
HCT: 29.8 % — ABNORMAL LOW (ref 39.0–52.0)
HEMATOCRIT: 29.1 % — AB (ref 39.0–52.0)
Hemoglobin: 9.3 g/dL — ABNORMAL LOW (ref 13.0–17.0)
Hemoglobin: 9 g/dL — ABNORMAL LOW (ref 13.0–17.0)
MCH: 29 pg (ref 26.0–34.0)
MCH: 28.9 pg (ref 26.0–34.0)
MCHC: 31.2 g/dL (ref 30.0–36.0)
MCHC: 30.9 g/dL (ref 30.0–36.0)
MCV: 92.8 fL (ref 78.0–100.0)
MCV: 93.6 fL (ref 78.0–100.0)
PLATELETS: 141 10*3/uL — AB (ref 150–400)
Platelets: 148 10*3/uL — ABNORMAL LOW (ref 150–400)
RBC: 3.21 MIL/uL — AB (ref 4.22–5.81)
RBC: 3.11 MIL/uL — ABNORMAL LOW (ref 4.22–5.81)
RDW: 22.4 % — ABNORMAL HIGH (ref 11.5–15.5)
RDW: 23 % — AB (ref 11.5–15.5)
WBC: 9.1 10*3/uL (ref 4.0–10.5)
WBC: 8.3 10*3/uL (ref 4.0–10.5)

## 2014-09-27 LAB — PROTIME-INR
INR: 2.17 — AB (ref 0.00–1.49)
INR: 1.85 — ABNORMAL HIGH (ref 0.00–1.49)
PROTHROMBIN TIME: 21.5 s — AB (ref 11.6–15.2)
Prothrombin Time: 24.3 seconds — ABNORMAL HIGH (ref 11.6–15.2)

## 2014-09-27 LAB — HEMOGLOBIN AND HEMATOCRIT, BLOOD
HEMATOCRIT: 30.3 % — AB (ref 39.0–52.0)
Hemoglobin: 9.6 g/dL — ABNORMAL LOW (ref 13.0–17.0)

## 2014-09-27 LAB — LACTATE DEHYDROGENASE: LDH: 246 U/L (ref 94–250)

## 2014-09-27 SURGERY — ENTEROSCOPY
Anesthesia: Monitor Anesthesia Care

## 2014-09-27 MED ORDER — SODIUM CHLORIDE 0.9 % IV SOLN: Freq: Once | INTRAVENOUS | Status: DC

## 2014-09-27 MED ORDER — LACTATED RINGERS IV SOLN
INTRAVENOUS | Status: DC | PRN
  Administered 2014-09-27: 13:00:00 via INTRAVENOUS

## 2014-09-27 MED ORDER — BUTAMBEN-TETRACAINE-BENZOCAINE 2-2-14 % EX AERO
INHALATION_SPRAY | CUTANEOUS | Status: DC | PRN
  Administered 2014-09-27: 2 via TOPICAL

## 2014-09-27 MED ORDER — GLYCOPYRROLATE 0.2 MG/ML IJ SOLN
INTRAMUSCULAR | Status: DC | PRN
  Administered 2014-09-27: 0.2 mg via INTRAVENOUS

## 2014-09-27 MED ORDER — PROPOFOL INFUSION 10 MG/ML OPTIME
INTRAVENOUS | Status: DC | PRN
  Administered 2014-09-27: 120 ug/kg/min via INTRAVENOUS

## 2014-09-27 MED ORDER — HEPARIN (PORCINE) IN NACL 100-0.45 UNIT/ML-% IJ SOLN
1650.0000 [IU]/h | INTRAMUSCULAR | Status: DC
  Administered 2014-09-27: 1350 [IU]/h via INTRAVENOUS
  Administered 2014-09-28: 1500 [IU]/h via INTRAVENOUS
  Administered 2014-09-29: 1650 [IU]/h via INTRAVENOUS
  Administered 2014-09-29: 1550 [IU]/h via INTRAVENOUS
  Filled 2014-09-27 (×7): qty 250

## 2014-09-27 MED ORDER — PROPOFOL 10 MG/ML IV BOLUS
INTRAVENOUS | Status: DC | PRN
  Administered 2014-09-27 (×2): 50 mg via INTRAVENOUS

## 2014-09-27 NOTE — Progress Notes (Signed)
ANTICOAGULATION CONSULT NOTE  Pharmacy Consult for Warfarin Indication: LVAD  No Known Allergies  Patient Measurements: Height: 6' (182.9 cm) Weight: 213 lb 8 oz (96.843 kg) IBW/kg (Calculated) : 77.6  Ht: 73 inches Wt: 102.1 kg  Vital Signs: Temp: 97.9 F (36.6 C) (12/29 0800) Temp Source: Oral (12/29 0800) Pulse Rate: 73 (12/29 0800)  Labs:  Recent Labs  09/25/14 1415 09/26/14 0238 09/26/14 1008 09/26/14 1617 09/27/14 0339  HGB 7.4* 8.0*  --  7.4* 9.3*  HCT 23.6* 25.6*  --  24.0* 29.8*  PLT 198 154  --  151 141*  LABPROT 39.6*  --  32.2*  --  24.3*  INR 4.04*  --  3.10*  --  2.17*  CREATININE 0.72 0.68  --   --  0.81  TROPONINI <0.03  --   --   --   --     Estimated Creatinine Clearance: 99.5 mL/min (by C-G formula based on Cr of 0.81).   Medical History: Past Medical History  Diagnosis Date  . Ischemic cardiomyopathy      CABG 2003, PCI 2007  EF 27%(myoview 2012  . Chronic systolic heart failure   . Hyperlipidemia   . Hypothyroidism   . Chronic anticoagulation     Afib and LVAD  . Obesity   . COPD (chronic obstructive pulmonary disease)   . Asbestosis(501)     "6 years in the [place]" (05/24/2013)  . Atrial fibrillation     permanent  . Paroxysmal ventricular tachycardia   . Coronary artery disease   . Implantable cardioverter-defibrillator Medtronic   . Hypertension   . Myocardial infarction 1990's-2000    "2 in  ~ the 1990's; 1 in ~ 2000" (05/24/2013)  . OSA on CPAP   . Depression   . LVAD (left ventricular assist device) present 12/2012    Assessment: 88 YOM s/p LVAD placement Apr '14 who presented to the ED on 12/23 and was found to be in Afib with Hgb 11.1 - cardioverted and sent home. At home pt continued to feel weak with falls on 12/24 and 12/25. Went back to the ED on 12/27 - still in Afib, Hgb down to 7.4, INR 4. 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 Holding ASA - new INR goal 2-2.5. LVAD stated to be functioning well. Pharmacy consulted to monitor warfarin dosing when appropriate to resume.  INR 2.17 this am, continue to hold warfarin and let INR drift down.   Goal of Therapy:  INR 2-2.5 (per Dr. Claris Gladden note on 12/27)   Plan:  1. Hold warfarin for now 2. Daily PT/INR 3. Will continue to monitor for any signs/symptoms of bleeding 4. Endoscopy today  Erin Hearing PharmD., BCPS Clinical Pharmacist Pager [phone] 09/27/2014 10:20 AM

## 2014-09-27 NOTE — Clinical Documentation Improvement (Signed)
  Pt was admitted with anemia, GI bleed workup and recurrent Afib. Pt just had a cardioversion for Afib on 09/21/14.   Abnormal findings (laboratory, x-ray, and other diagnostic results) are not coded and reported unless the physician indicates their clinical significance. If possible, please help by clarifying the clinical significance (if any) related to these abnormal laboratory findings.  Component      Sodium  Latest Ref Rng      135 - 145 mmol/L  09/26/2014      133 (L)  09/27/2014      130 (L)    Thank you for your time with this!   Almon Register, RN Clinical Documentation Improvement Specialist ([phone]

## 2014-09-27 NOTE — Op Note (Signed)
[facility] [place], [id number]   ENTEROSCOPY PROCEDURE REPORT     EXAM DATE: 09/27/2014  PATIENT NAME:      [name], [name]           MR #:      [id number]  BIRTHDATE:       Jul 26, 1941      VISIT #:     [id number]  ATTENDING:     Gatha Mayer, MD, [facility]     STATUS:     inpatient  ASSISTANT:      Sharon Mt, Chiquita Loth   ASA CLASS:        Class III INDICATIONS:  The patient is a 73 yr old male here for an enteroscopy procedure due to control bleeding. PROCEDURE PERFORMED:     Small bowel enteroscopy with control of bleeding MEDICATIONS:     Monitored anesthesia care and Per Anesthesia  CONSENT: The patient understands the risks and benefits of the procedure and understands that these risks include, but are not limited to: sedation, allergic reaction, infection, perforation and/or bleeding. Alternative means of evaluation and treatment include, among others: physical exam, x-rays, and/or surgical intervention. The patient elects to proceed with this endoscopic procedure.  DESCRIPTION OF PROCEDURE: During intra-op preparation period all mechanical & medical equipment was checked for proper function. Hand hygiene and appropriate measures for infection prevention was taken. After the risks, benefits and alternatives of the procedure were thoroughly explained, Informed consent was verified, confirmed and timeout was successfully executed by the treatment team. The Pentax [id number]-2900 endoscope was introduced through the mouth and advanced to the proximal jejunum   The overall prep quality was excellent.. The instrument was then slowly withdrawn while examining the mucosa circumferentially. The scope was then completely withdrawn from the patient and the procedure terminated.  Area of bleeding in jejunum at extent of scope insertion.  I treated this with APC and bleeding slowed and 3 clips were placed.   Some ooze of blood after that.  Exact site not seen but presumed AVM in this situation. Otherwise normal exam.    ADVERSE EVENTS:      There were no immediate complications.  IMPRESSIONS:     Area of bleeding in jejunum at extent of scope insertion.  I treated this with APC and bleeding slowed and 3 clips were placed.  Some ooze of blood after that.  Exact site not seen but presumed AVM in this situation. Otherwise normal exam  RECOMMENDATIONS:     Observe , switch to heparin for now if ok, if persistent bleeding suspected rescope and retreat off anti-coag if we can    Gatha Mayer, MD, [facility] eSigned:  Gatha Mayer, MD, [facility] 09/27/2014 2:48 PM revise

## 2014-09-27 NOTE — Anesthesia Postprocedure Evaluation (Signed)
  Anesthesia Post-op Note  Patient: [name]  Procedure(s) Performed: Procedure(s): ENTEROSCOPY (N/A)  Patient Location:    Anesthesia Type: MAC  Level of Consciousness: awake, alert  and oriented  Airway and Oxygen Therapy: Patient Spontanous Breathing  Post-op Pain: none  Post-op Assessment: Post-op Vital signs reviewed, Patient's Cardiovascular Status Stable, Respiratory Function Stable, Patent Airway, No signs of Nausea or vomiting and Pain level controlled  Post-op Vital Signs: stable  Last Vitals:  Filed Vitals:   09/27/14 1800  BP:   Pulse: 82  Temp:   Resp: 17    Complications: No apparent anesthesia complications

## 2014-09-27 NOTE — Progress Notes (Signed)
Pt requesting albuterol treatment. Pt states he's SOB, sats >95% on Room air. Will continue to monitor.

## 2014-09-27 NOTE — Anesthesia Preprocedure Evaluation (Signed)
Anesthesia Evaluation  Patient identified by MRN, date of birth, ID band Patient awake    Reviewed: Allergy & Precautions, H&P , NPO status , Patient's Chart, lab work & pertinent test results  History of Anesthesia Complications Negative for: history of anesthetic complications  Airway Mallampati: II       Dental  (+) Edentulous Upper, Edentulous Lower   Pulmonary sleep apnea , COPDformer smoker,    Pulmonary exam normal       Cardiovascular hypertension, + CAD and + Past MI + Cardiac Defibrillator Rhythm:Regular Rate:Normal  Heart mate LVAD s/p 18 mos.   Neuro/Psych Depression    GI/Hepatic PUD, GI bleeding   Endo/Other    Renal/GU      Musculoskeletal   Abdominal Normal abdominal exam  (+)   Peds  Hematology  (+) anemia ,   Anesthesia Other Findings   Reproductive/Obstetrics                             Anesthesia Physical Anesthesia Plan  ASA: IV  Anesthesia Plan: MAC   Post-op Pain Management:    Induction: Intravenous  Airway Management Planned: Nasal Cannula  Additional Equipment:   Intra-op Plan:   Post-operative Plan:   Informed Consent: I have reviewed the patients History and Physical, chart, labs and discussed the procedure including the risks, benefits and alternatives for the proposed anesthesia with the patient or authorized representative who has indicated his/her understanding and acceptance.     Plan Discussed with: CRNA and Anesthesiologist  Anesthesia Plan Comments:         Anesthesia Quick Evaluation

## 2014-09-27 NOTE — Progress Notes (Addendum)
Patient ID: [name], male   DOB: 1941-08-21, 73 y.o.   MRN: [id number]   SUBJECTIVE: Hemoglobin up 9.3, feels better.  He appears to be back in NSR.  Plan for endoscopy today.  MAP 70s-80s.   LVAD: flow 5.6, speed 9200 rpm, PI 5.0, power 5.7.  Rare PI events.   Scheduled Meds: . sodium chloride   Intravenous Once  . amiodarone  200 mg Oral BID  . atorvastatin  40 mg Oral q1800  . budesonide-formoterol  2 puff Inhalation BID  . citalopram  20 mg Oral QHS  . docusate sodium  100 mg Oral q morning - 10a   And  . docusate sodium  200 mg Oral QHS  . levothyroxine  100 mcg Oral QHS  . multivitamin  5 mL Oral Daily  . pantoprazole  40 mg Oral BID  . sodium chloride  3 mL Intravenous Q12H  . tiotropium  18 mcg Inhalation Daily  . cyanocobalamin  1,000 mcg Oral QHS  . vitamin C  500 mg Oral BID   Continuous Infusions: . sodium chloride 250 mL (09/25/14 2258)  . sodium chloride Stopped (09/26/14 2045)   PRN Meds:.sodium chloride, albuterol, sodium chloride    Filed Vitals:   09/27/14 0004 09/27/14 0235 09/27/14 0400 09/27/14 0500  Pulse: 89 81 74   Temp: 97.8 F (36.6 C) 98.3 F (36.8 C) 98.7 F (37.1 C)   TempSrc: Oral Oral Oral   Resp: 29 22 21    Height:      Weight:    213 lb 8 oz (96.843 kg)  SpO2: 98% 95% 98%     Intake/Output Summary (Last 24 hours) at 09/27/14 0710 Last data filed at 09/27/14 0427  Gross per 24 hour  Intake   1190 ml  Output   2140 ml  Net   -950 ml    LABS: Basic Metabolic Panel:  Recent Labs  09/26/14 0238 09/27/14 0339  NA 133* 130*  K 3.9 3.7  CL 107 98  CO2 21 24  GLUCOSE 115* 136*  BUN 21 18  CREATININE 0.68 0.81  CALCIUM 8.9 9.0   Liver Function Tests:  Recent Labs  09/25/14 1415  AST 32  ALT 17  ALKPHOS 43  BILITOT 0.8  PROT 5.6*  ALBUMIN 3.2*   No results for input(s): LIPASE, AMYLASE in the last 72 hours. CBC:  Recent Labs  09/25/14 1415  09/26/14 1617 09/27/14 0339  WBC 12.8*  < > 8.5 9.1    NEUTROABS 10.5*  --   --   --   HGB 7.4*  < > 7.4* 9.3*  HCT 23.6*  < > 24.0* 29.8*  MCV 101.7*  < > 97.6 92.8  PLT 198  < > 151 141*  < > = values in this interval not displayed. Cardiac Enzymes:  Recent Labs  09/25/14 1415  TROPONINI <0.03   BNP: Invalid input(s): POCBNP D-Dimer: No results for input(s): DDIMER in the last 72 hours. Hemoglobin A1C: No results for input(s): HGBA1C in the last 72 hours. Fasting Lipid Panel: No results for input(s): CHOL, HDL, LDLCALC, TRIG, CHOLHDL, LDLDIRECT in the last 72 hours. Thyroid Function Tests: No results for input(s): TSH, T4TOTAL, T3FREE, THYROIDAB in the last 72 hours.  Invalid input(s): FREET3 Anemia Panel: No results for input(s): VITAMINB12, FOLATE, FERRITIN, TIBC, IRON, RETICCTPCT in the last 72 hours.  RADIOLOGY: Dg Chest Port 1 View  09/25/2014   CLINICAL DATA:  Weakness and nausea, shortness of breath  EXAM:  PORTABLE CHEST - 1 VIEW  COMPARISON:  07/29/2014  FINDINGS: And LVAD is again identified. The cardiac shadow remains enlarged. A defibrillator is again seen. Postsurgical changes are again noted. The lungs are well aerated with mild blunting of the costophrenic angles. No focal confluent infiltrate is seen. No sizable effusion is noted.  IMPRESSION: LVAD in appropriate position.  No acute abnormality is noted.   Electronically Signed   By: Inez Catalina M.D.   On: 09/25/2014 14:20    Physical Exam: General: Well appearing. No resp difficulty HEENT: normal Neck: supple. JVP 8 cm. Carotids 2+ bilat; no bruits. No lymphadenopathy or thryomegaly appreciated. Cor: LVAD hum present. Lungs: clear Abdomen: soft, nontender, nondistended. No hepatosplenomegaly. No bruits or masses. Good bowel sounds. Driveline: C/D/I; securement device intact and driveline incorporated Extremities: no cyanosis, clubbing, rash, edema Neuro: alert & orientedx3, cranial nerves grossly intact. moves all 4 extremities w/o difficulty. Affect  pleasant  TELEMETRY: Reviewed telemetry pt in atrial fibrillation, HR 70s    Assessment:   1) PAF - was in NSR 07/25/14. In atrial fibrillation on 09/21/14 and cardioverted back to NSR. Went back into atrial fibrillation some time after 12/23.  2) Weakness/Fatigue 3) Chronic systolic HF - s/p LVAD 0/5697 4) Anemia: Hemoglobin with drop from 11.1=>7.4 at this admission. Concern for recurrent GI bleed.  5) HTN 6) NSVT  Plan/Discussion:    I suspect symptoms are due to a combination of recurrence of atrial fibrillation (which he has historically tolerated poorly) and GI blood loss with symptomatic anemia.   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 He has noted black stool but had not been particularly worried about it because he is taking iron. I suspect recurrent bleeding. He had 2 units PRBCs on 12/27. Hemoglobin up to 9.3, INR 2.17. He had a couple of black BMs yesterday. - Holding coumadin for now, has been off ASA long-term.  He will have endoscopy today, depending on findings will aim to run his INR 2-2.5.  - Continue Protonix.   Atrial fibrillation has recurred not long after cardioversion on 12/23. He is now on amiodarone and appears to be back in NSR this morning.  Will get ECG.    Losartan held for now.    I reviewed the LVAD parameters from today, and compared the results to the patient's prior recorded data. No programming changes were made. The LVAD is functioning within specified parameters. The patient performs LVAD self-test daily. LVAD interrogation was negative for any significant power changes, alarms or PI events/speed drops. LVAD equipment check completed and is in good working order. Back-up equipment present. LVAD education done on emergency procedures and precautions and reviewed exit site care.  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 I will give him another unit PRBCs this evening. Discussed with Dr. Carlean Purl, will hold warfarin for now, start heparin tonight now that INR < 2.    Loralie Champagne 09/27/2014 5:06 PM

## 2014-09-27 NOTE — Progress Notes (Signed)
ANTICOAGULATION CONSULT NOTE  Pharmacy Consult for Warfarin Indication: LVAD  No Known Allergies  Patient Measurements: Height: 6' (182.9 cm) Weight: 213 lb 8 oz (96.843 kg) IBW/kg (Calculated) : 77.6  Ht: 73 inches Wt: 102.1 kg  Vital Signs: Temp: 98.7 F (37.1 C) (12/29 1600) Temp Source: Oral (12/29 1600) BP: 105/85 mmHg (12/29 1450) Pulse Rate: 82 (12/29 1600)  Labs:  Recent Labs  09/25/14 1415 09/26/14 0238 09/26/14 1008 09/26/14 1617 09/27/14 0339 09/27/14 1221  HGB 7.4* 8.0*  --  7.4* 9.3* 9.0*  HCT 23.6* 25.6*  --  24.0* 29.8* 29.1*  PLT 198 154  --  151 141* 148*  LABPROT 39.6*  --  32.2*  --  24.3* 21.5*  INR 4.04*  --  3.10*  --  2.17* 1.85*  CREATININE 0.72 0.68  --   --  0.81  --   TROPONINI <0.03  --   --   --   --   --     Estimated Creatinine Clearance: 99.5 mL/min (by C-G formula based on Cr of 0.81).   Medical History: Past Medical History  Diagnosis Date  . Ischemic cardiomyopathy      CABG 2003, PCI 2007  EF 27%(myoview 2012  . Chronic systolic heart failure   . Hyperlipidemia   . Hypothyroidism   . Chronic anticoagulation     Afib and LVAD  . Obesity   . COPD (chronic obstructive pulmonary disease)   . Asbestosis(501)     "6 years in the [place]" (05/24/2013)  . Atrial fibrillation     permanent  . Paroxysmal ventricular tachycardia   . Coronary artery disease   . Implantable cardioverter-defibrillator Medtronic   . Hypertension   . Myocardial infarction 1990's-2000    "2 in  ~ the 1990's; 1 in ~ 2000" (05/24/2013)  . OSA on CPAP   . Depression   . LVAD (left ventricular assist device) present 12/2012    Assessment: 68 YOM s/p LVAD placement Apr '14 who presented to the ED on 12/23 and was found to be in Afib with Hgb 11.1 - cardioverted and sent home. At home pt continued to feel weak with falls on 12/24 and 12/25. Went back to the ED on 12/27 - still in Afib, Hgb down to 7.4, INR 4. 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 Holding ASA - new INR goal 2-2.5. LVAD stated to be functioning well.   PM: Pt got endoscopy today and showed some GIB. Plan to cover with heparin for now and start tonight without bolus. We'll shoot for a lower goal.   Goal of Therapy:  INR 2-2.5 (per Dr. Claris Gladden note on 12/27)  Heparin level goal = 0.3-0.5   Plan:   Cont to hold coumadin Heparin at 1350 units/hr at 2000 Heparin level in AM Daily level and CBC  Onnie Boer, PharmD Pager: [phone] 09/27/2014 5:18 PM

## 2014-09-27 NOTE — Transfer of Care (Signed)
Immediate Anesthesia Transfer of Care Note  Patient: [name]  Procedure(s) Performed: Procedure(s): ENTEROSCOPY (N/A)  Patient Location: PACU and Endoscopy Unit  Anesthesia Type:MAC  Level of Consciousness: awake, alert , oriented and patient cooperative  Airway & Oxygen Therapy: Patient Spontanous Breathing and Patient connected to nasal cannula oxygen  Post-op Assessment: Report given to PACU RN, Post -op Vital signs reviewed and stable and Patient moving all extremities  Post vital signs: Reviewed and stable  Complications: No apparent anesthesia complications

## 2014-09-27 NOTE — Anesthesia Procedure Notes (Signed)
Date/Time: 09/27/2014 1:25 PM Performed by: Izora Gala Pre-anesthesia Checklist: Patient identified, Emergency Drugs available, Suction available and Patient being monitored Patient Re-evaluated:Patient Re-evaluated prior to inductionOxygen Delivery Method: Nasal cannula Preoxygenation: Pre-oxygenation with 100% oxygen Intubation Type: IV induction Placement Confirmation: positive ETCO2

## 2014-09-28 ENCOUNTER — Inpatient Hospital Stay (HOSPITAL_COMMUNITY): Payer: Non-veteran care

## 2014-09-28 ENCOUNTER — Encounter (HOSPITAL_COMMUNITY): Payer: Self-pay | Admitting: Internal Medicine

## 2014-09-28 LAB — PROTIME-INR
INR: 1.46 (ref 0.00–1.49)
PROTHROMBIN TIME: 17.8 s — AB (ref 11.6–15.2)

## 2014-09-28 LAB — CBC
HCT: 28.9 % — ABNORMAL LOW (ref 39.0–52.0)
HEMATOCRIT: 28.4 % — AB (ref 39.0–52.0)
HEMOGLOBIN: 8.8 g/dL — AB (ref 13.0–17.0)
Hemoglobin: 9 g/dL — ABNORMAL LOW (ref 13.0–17.0)
MCH: 28.7 pg (ref 26.0–34.0)
MCH: 28.9 pg (ref 26.0–34.0)
MCHC: 31.1 g/dL (ref 30.0–36.0)
MCHC: 31 g/dL (ref 30.0–36.0)
MCV: 92 fL (ref 78.0–100.0)
MCV: 93.1 fL (ref 78.0–100.0)
PLATELETS: 139 10*3/uL — AB (ref 150–400)
Platelets: 137 10*3/uL — ABNORMAL LOW (ref 150–400)
RBC: 3.14 MIL/uL — AB (ref 4.22–5.81)
RBC: 3.05 MIL/uL — AB (ref 4.22–5.81)
RDW: 22.6 % — AB (ref 11.5–15.5)
RDW: 22.8 % — ABNORMAL HIGH (ref 11.5–15.5)
WBC: 17 10*3/uL — ABNORMAL HIGH (ref 4.0–10.5)
WBC: 11.3 10*3/uL — ABNORMAL HIGH (ref 4.0–10.5)

## 2014-09-28 LAB — URINALYSIS, ROUTINE W REFLEX MICROSCOPIC
Bilirubin Urine: NEGATIVE
Glucose, UA: NEGATIVE mg/dL
Hgb urine dipstick: NEGATIVE
Ketones, ur: NEGATIVE mg/dL
LEUKOCYTES UA: NEGATIVE
Nitrite: NEGATIVE
PH: 6 (ref 5.0–8.0)
Protein, ur: NEGATIVE mg/dL
Specific Gravity, Urine: 1.009 (ref 1.005–1.030)
Urobilinogen, UA: 1 mg/dL (ref 0.0–1.0)

## 2014-09-28 LAB — BASIC METABOLIC PANEL
Anion gap: 8 (ref 5–15)
BUN: 20 mg/dL (ref 6–23)
CO2: 23 mmol/L (ref 19–32)
CREATININE: 0.86 mg/dL (ref 0.50–1.35)
Calcium: 8.8 mg/dL (ref 8.4–10.5)
Chloride: 99 mEq/L (ref 96–112)
GFR calc Af Amer: >90 mL/min (ref >90)
GFR, EST NON AFRICAN AMERICAN: 85 mL/min — AB (ref >90)
Glucose, Bld: 144 mg/dL — ABNORMAL HIGH (ref 70–99)
POTASSIUM: 3.8 mmol/L (ref 3.5–5.1)
Sodium: 130 mmol/L — ABNORMAL LOW (ref 135–145)

## 2014-09-28 LAB — HEPARIN LEVEL (UNFRACTIONATED)
Heparin Unfractionated: 0.16 IU/mL — ABNORMAL LOW (ref 0.30–0.70)
Heparin Unfractionated: 0.31 IU/mL (ref 0.30–0.70)

## 2014-09-28 LAB — LACTATE DEHYDROGENASE: LDH: 256 U/L — ABNORMAL HIGH (ref 94–250)

## 2014-09-28 NOTE — Progress Notes (Signed)
Patient ID: [name], male   DOB: 27-Dec-1940, 73 y.o.   MRN: [id number]   SUBJECTIVE:  12/29 had enteroscopy with bleeding noted in jejunum requiring 3 clips. Small ooze noted after that. Received 1UPRBC 12/29 with hgb up to 9.6 but this am back down to 9.0. Heparin restarted last night. No BMs over night.   Dizziness resolved. Denies SOB.   MAP 80s  INR 1.46 on Heparin  LDH 246>256  WBC 17  LVAD: flow 5.8, speed 9200 rpm, PI 5.9, power 5.9.  One PI event yesterday power spike noted --> 10.   Scheduled Meds: . sodium chloride   Intravenous Once  . sodium chloride   Intravenous Once  . amiodarone  200 mg Oral BID  . atorvastatin  40 mg Oral q1800  . budesonide-formoterol  2 puff Inhalation BID  . citalopram  20 mg Oral QHS  . docusate sodium  100 mg Oral q morning - 10a   And  . docusate sodium  200 mg Oral QHS  . levothyroxine  100 mcg Oral QHS  . multivitamin  5 mL Oral Daily  . pantoprazole  40 mg Oral BID  . sodium chloride  3 mL Intravenous Q12H  . tiotropium  18 mcg Inhalation Daily  . cyanocobalamin  1,000 mcg Oral QHS  . vitamin C  500 mg Oral BID   Continuous Infusions: . sodium chloride 250 mL (09/25/14 2258)  . heparin 1,500 Units/hr (09/28/14 0538)   PRN Meds:.sodium chloride, albuterol, sodium chloride    Filed Vitals:   09/27/14 2348 09/28/14 0000 09/28/14 0400 09/28/14 0500  BP:      Pulse:  92 73   Temp: 98.2 F (36.8 C)  97.6 F (36.4 C)   TempSrc: Oral  Oral   Resp:  30 22   Height:      Weight:    211 lb 12.8 oz (96.072 kg)  SpO2:  93% 96%     Intake/Output Summary (Last 24 hours) at 09/28/14 0701 Last data filed at 09/28/14 0600  Gross per 24 hour  Intake 1667.88 ml  Output   1410 ml  Net 257.88 ml    LABS: Basic Metabolic Panel:  Recent Labs  09/27/14 0339 09/28/14 0334  NA 130* 130*  K 3.7 3.8  CL 98 99  CO2 24 23  GLUCOSE 136* 144*  BUN 18 20  CREATININE 0.81 0.86  CALCIUM 9.0 8.8   Liver Function  Tests:  Recent Labs  09/25/14 1415  AST 32  ALT 17  ALKPHOS 43  BILITOT 0.8  PROT 5.6*  ALBUMIN 3.2*   No results for input(s): LIPASE, AMYLASE in the last 72 hours. CBC:  Recent Labs  09/25/14 1415  09/27/14 1221 09/27/14 2222 09/28/14 0334  WBC 12.8*  < > 8.3  --  17.0*  NEUTROABS 10.5*  --   --   --   --   HGB 7.4*  < > 9.0* 9.6* 9.0*  HCT 23.6*  < > 29.1* 30.3* 28.9*  MCV 101.7*  < > 93.6  --  92.0  PLT 198  < > 148*  --  139*  < > = values in this interval not displayed. Cardiac Enzymes:  Recent Labs  09/25/14 1415  TROPONINI <0.03   BNP: Invalid input(s): POCBNP D-Dimer: No results for input(s): DDIMER in the last 72 hours. Hemoglobin A1C: No results for input(s): HGBA1C in the last 72 hours. Fasting Lipid Panel: No results for input(s): CHOL, HDL, LDLCALC,  TRIG, CHOLHDL, LDLDIRECT in the last 72 hours. Thyroid Function Tests: No results for input(s): TSH, T4TOTAL, T3FREE, THYROIDAB in the last 72 hours.  Invalid input(s): FREET3 Anemia Panel: No results for input(s): VITAMINB12, FOLATE, FERRITIN, TIBC, IRON, RETICCTPCT in the last 72 hours.  RADIOLOGY: Dg Chest Port 1 View  09/25/2014   CLINICAL DATA:  Weakness and nausea, shortness of breath  EXAM: PORTABLE CHEST - 1 VIEW  COMPARISON:  07/29/2014  FINDINGS: And LVAD is again identified. The cardiac shadow remains enlarged. A defibrillator is again seen. Postsurgical changes are again noted. The lungs are well aerated with mild blunting of the costophrenic angles. No focal confluent infiltrate is seen. No sizable effusion is noted.  IMPRESSION: LVAD in appropriate position.  No acute abnormality is noted.   Electronically Signed   By: Inez Catalina M.D.   On: 09/25/2014 14:20    Physical Exam: General: Well appearing. No resp difficulty. Sitting on the side of the bed HEENT: normal Neck: supple. JVP 7-8 cm. Carotids 2+ bilat; no bruits. No lymphadenopathy or thryomegaly appreciated. Cor: LVAD hum  present. Lungs: clear Abdomen: soft, nontender, nondistended. No hepatosplenomegaly. No bruits or masses. Good bowel sounds. Driveline: C/D/I; securement device intact and driveline incorporated no erythema.  Extremities: no cyanosis, clubbing, rash, edema Neuro: alert & orientedx3, cranial nerves grossly intact. moves all 4 extremities w/o difficulty. Affect pleasant  TELEMETRY: SR 80s    Assessment:   1) PAF - was in NSR 07/25/14. In atrial fibrillation on 09/21/14 and cardioverted back to NSR. Went back into atrial fibrillation some time after 12/23.  Now NSR on amiodarone.  2) Weakness/Fatigue 3) Chronic systolic HF - s/p LVAD 01/931 4) Anemia: Hemoglobin with drop from 11.1=>7.4 at this admission. GI bleed Enteroscopy  Bleeding from jejunum 3 clips placed.  5) HTN 6) NSVT 7) Elevated WBC  Plan/Discussion:     Last episode of bleeding was in 10/15 and was from a duodenal AVM. He has noted black stool but had not been particularly worried about it because he is taking iron. I suspect recurrent bleeding. He had 2 units PRBCs on 12/27. 12/29 had enteroscopy with bleeding noted in jejunum (presumed AVM) requiring 3 clips. Small ooze noted after that. Received 1U PRBC 12/29 with hgb up to 9.6 but this am back down to 9.0. Check CBC at 1200 today.  Continue to follow on heparin gtt, assess for recurrent bleeding.   Atrial fibrillation has recurred not long after cardioversion on 12/23. He is now on amiodarone and appears to be back in NSR.    WBC up this am 9.1>17.  No fevers noted. Repeat CBC at 1200 . Drive line site no erythema.   I reviewed the LVAD parameters from today, and compared the results to the patient's prior recorded data. No programming changes were made. The LVAD is functioning within specified parameters. The patient performs LVAD self-test daily. LVAD interrogation was negative for any significant power changes, alarms or PI events/speed drops. LVAD  equipment check completed and is in good working order. Back-up equipment present. LVAD education done on emergency procedures and precautions and reviewed exit site care.  CLEGG,AMY NP-C  7:02 AM  VAD Team Pager [phone] (7am - 7am) +++VAD ISSUES ONLY+++   Advanced Heart Failure Team Pager [phone] (M-F; [place])  Please contact [facility] for night-coverage after hours (4p -7a ) and weekends on [email or web address] for all non- LVAD Issues  Patient seen with NP, agree with the above note.  Hemoglobin fairly stable today.  Had treatment of bleeding AVM yesterday.  This unfortunately may be an ongoing problem.  He is currently on heparin and we are watching for rebleeding.  He remains in NSR on amiodarone.  Repeat CBC at noon.  Workup with UA/culture and CXR given rise in WBCs.  No fever.  Loralie Champagne 09/28/2014 7:43 AM

## 2014-09-28 NOTE — Progress Notes (Addendum)
     [facility] Progress Note  Subjective:   S/P smal bowel endoscopy 09/27/14 with bleeding area in jejunum. Hgb 9.0 today. Tol reg diet for breakfast.   Objective:  Vital signs in last 24 hours: Temp:  [97.4 F (36.3 C)-99.4 F (37.4 C)] 97.4 F (36.3 C) (12/30 0723) Pulse Rate:  [70-95] 79 (12/30 0800) Resp:  [10-32] 28 (12/30 0800) BP: (95-111)/(64-85) 105/85 mmHg (12/29 1450) SpO2:  [91 %-100 %] 96 % (12/30 0803) Weight:  [211 lb 12.8 oz (96.072 kg)] 211 lb 12.8 oz (96.072 kg) (12/30 0500) Last BM Date: 09/27/14 General:   Alert,  Well-developed,    in NAD Heart: LVAD hum presnt Pulmlungs clear Abdomen:  Soft, nontender and nondistended. Normal bowel sounds, without guarding, and without rebound.   Extremities:  Without edema. Neurologic:  Alert and  oriented x4;  grossly normal neurologically. Psych:  Alert and cooperative. Normal mood and affect.  Lab Results:  Recent Labs  09/27/14 0339 09/27/14 1221 09/27/14 2222 09/28/14 0334  WBC 9.1 8.3  --  17.0*  HGB 9.3* 9.0* 9.6* 9.0*  HCT 29.8* 29.1* 30.3* 28.9*  PLT 141* 148*  --  139*   PT/INR  Recent Labs  09/27/14 1221 09/28/14 0334  LABPROT 21.5* 17.8*  INR 1.85* 1.46   PROCEDURES: Small bowel endoscopy 09/27/14:IMPRESSIONS: Area of bleeding in jejunum at extent of scope insertion. I treated this with APC and bleeding slowed and 3 clips were placed. Some ooze of blood after that. Exact site not seen but presumed AVM in this situation. Otherwise normal exam RECOMMENDATIONS: Observe , switch to heparin for now if ok, if persistent bleeding suspected rescope and retreat off anti-coag if we can   ASSESSMENT/PLAN:  73 yo male with recurrent anemia on chronic coumadin, s/p enteroscopy yesterday-clips placed on bleeding area in jejunum. Hgb this morning 9.Currently on heparin. Continue to monitor h/h.     LOS: 3 days   Hvozdovic, Deloris Ping 09/28/2014, Pager [phone]  [place]  Attending  I have also seen and assessed the patient and agree with the above note. Repeat Hgb 8.8 No stools  Seems like he is slowing/stopping re: bleeding.  Will see what tomorrow brings. If Hgb stable and no other signs of bleeding would probably restart warfarin.  Gatha Mayer, MD, [facility] [phone] (pager) 09/28/2014 5:14 PM

## 2014-09-28 NOTE — Progress Notes (Signed)
ANTICOAGULATION CONSULT NOTE  Pharmacy Consult for heparin Indication: LVAD  No Known Allergies  Patient Measurements: Height: 6' (182.9 cm) Weight: 211 lb 12.8 oz (96.072 kg) IBW/kg (Calculated) : 77.6  Ht: 73 inches Wt: 102.1 kg  Vital Signs: Temp: 97.6 F (36.4 C) (12/30 0400) Temp Source: Oral (12/30 0400) Pulse Rate: 73 (12/30 0400)  Labs:  Recent Labs  09/25/14 1415 09/26/14 0238  09/27/14 0339 09/27/14 1221 09/27/14 2222 09/28/14 0334  HGB 7.4* 8.0*  < > 9.3* 9.0* 9.6* 9.0*  HCT 23.6* 25.6*  < > 29.8* 29.1* 30.3* 28.9*  PLT 198 154  < > 141* 148*  --  139*  LABPROT 39.6*  --   < > 24.3* 21.5*  --  17.8*  INR 4.04*  --   < > 2.17* 1.85*  --  1.46  HEPARINUNFRC  --   --   --   --   --   --  0.16*  CREATININE 0.72 0.68  --  0.81  --   --  0.86  TROPONINI <0.03  --   --   --   --   --   --   < > = values in this interval not displayed.  Estimated Creatinine Clearance: 93.3 mL/min (by C-G formula based on Cr of 0.86).  Assessment: 74 yo male with LVAD/Afib, s/p endoscopy, for heparin  Goal of Therapy:  INR 2-2.5 (per Dr. Claris Gladden note on 12/27)  Heparin level goal = 0.3-0.5   Plan:  Increase Heparin 1500 units/hr Check heparin level in 8 hours.   Phillis Knack, PharmD, BCPS   09/28/2014 5:21 AM

## 2014-09-28 NOTE — Progress Notes (Signed)
VAD coordinator paged to Endoscopy. Remained with patient during endoscopy with the following VAD parameters noted:   Time:  MAP:  Flow:  PI: Power: Speed:  Pre procedure:  1:00 pm 84  6.0  5.0 6.1  9200 1:25  83  6.4  5.3 6.1  9200  Intra procedure:  1:35  92  6.6  4.9 6.2  9200 1:40  110  6.2  6.2 6.6  9200 1:55  97  6.2  5.8 6.1  9200 2:10  87  6.9  5.4 6.5  9200 2:25  88  7.6  4.9 6.8  9200  Post procedure:  2:30  86  7.2  5.0 6.2  9200  VAD coordinator accompanied patient back to room. Pt awake, alert, wife at bedside.

## 2014-09-28 NOTE — Plan of Care (Signed)
Problem: Phase II Progression Outcomes Goal: Progress activity as tolerated unless otherwise ordered Outcome: Not Progressing Cardiac Rehab order placed. Given verbal encouragement to patient regarding getting OOB and ambulating.

## 2014-09-28 NOTE — Progress Notes (Signed)
ANTICOAGULATION CONSULT NOTE  Pharmacy Consult for heparin Indication: LVAD  No Known Allergies  Patient Measurements: Height: 6' (182.9 cm) Weight: 211 lb 12.8 oz (96.072 kg) IBW/kg (Calculated) : 77.6  Ht: 73 inches Wt: 102.1 kg  Vital Signs: Temp: 97.9 F (36.6 C) (12/30 1127) Temp Source: Oral (12/30 1127) Pulse Rate: 84 (12/30 1123)  Labs:  Recent Labs  09/25/14 1415 09/26/14 0238  09/27/14 0339 09/27/14 1221 09/27/14 2222 09/28/14 0334 09/28/14 1200  HGB 7.4* 8.0*  < > 9.3* 9.0* 9.6* 9.0* 8.8*  HCT 23.6* 25.6*  < > 29.8* 29.1* 30.3* 28.9* 28.4*  PLT 198 154  < > 141* 148*  --  139* 137*  LABPROT 39.6*  --   < > 24.3* 21.5*  --  17.8*  --   INR 4.04*  --   < > 2.17* 1.85*  --  1.46  --   HEPARINUNFRC  --   --   --   --   --   --  0.16* 0.31  CREATININE 0.72 0.68  --  0.81  --   --  0.86  --   TROPONINI <0.03  --   --   --   --   --   --   --   < > = values in this interval not displayed.  Estimated Creatinine Clearance: 93.3 mL/min (by C-G formula based on Cr of 0.86).   Medical History: Past Medical History  Diagnosis Date  . Ischemic cardiomyopathy      CABG 2003, PCI 2007  EF 27%(myoview 2012  . Chronic systolic heart failure   . Hyperlipidemia   . Hypothyroidism   . Chronic anticoagulation     Afib and LVAD  . Obesity   . COPD (chronic obstructive pulmonary disease)   . Asbestosis(501)     "6 years in the [place]" (05/24/2013)  . Atrial fibrillation     permanent  . Paroxysmal ventricular tachycardia   . Coronary artery disease   . Implantable cardioverter-defibrillator [employer]tronic   . Hypertension   . Myocardial infarction 1990's-2000    "2 in  ~ the 1990's; 1 in ~ 2000" (05/24/2013)  . OSA on CPAP   . Depression   . LVAD (left ventricular assist device) present 12/2012    Assessment: 57 YOM s/p LVAD placement Apr '14 who presented to the ED on 12/23 and was found to be in Afib with Hgb 11.1 - cardioverted and sent home. At home pt continued  to feel weak with falls on 12/24 and 12/25. Went back to the ED on 12/27 - still in Afib, Hgb down to 7.4, INR 4. 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 LVAD stated to be functioning well.   Pt got endoscopy 12/29, clipping done. Significant bleeding/oozing but that appears to be resolving. No overt bleeding noted overnight. Another unit of blood given last night. Hgb trending down this am 9.0>>8.8.  Will continue heparin for now and hold coumadin to allow bleeding to resolve. Heparin now at low end of goal (0.31) on 1500 units/hr.  Goal of Therapy:  INR 2-2.5 (per Dr. Claris Gladden note on 12/27)  Heparin level goal = 0.3-0.5   Plan:  Cont to hold coumadin Heparin at 1550 units/hr  Heparin level in AM Daily level and CBC  Erin Hearing PharmD., BCPS Clinical Pharmacist Pager  [phone] 09/28/2014 1:56 PM

## 2014-09-28 NOTE — Addendum Note (Signed)
Addendum  created 09/28/14 2575 by Roberts Gaudy, MD   Modules edited: [facility] Follow-up Actions

## 2014-09-29 LAB — TYPE AND SCREEN
ABO/RH(D): O POS
ANTIBODY SCREEN: NEGATIVE
UNIT DIVISION: 0
UNIT DIVISION: 0
UNIT DIVISION: 0
Unit division: 0
Unit division: 0
Unit division: 0

## 2014-09-29 LAB — BASIC METABOLIC PANEL
Anion gap: 6 (ref 5–15)
BUN: 16 mg/dL (ref 6–23)
CO2: 23 mmol/L (ref 19–32)
Calcium: 8.7 mg/dL (ref 8.4–10.5)
Chloride: 104 mEq/L (ref 96–112)
Creatinine, Ser: 0.78 mg/dL (ref 0.50–1.35)
GFR calc Af Amer: >90 mL/min (ref >90)
GFR, EST NON AFRICAN AMERICAN: 88 mL/min — AB (ref >90)
GLUCOSE: 109 mg/dL — AB (ref 70–99)
POTASSIUM: 3.6 mmol/L (ref 3.5–5.1)
Sodium: 133 mmol/L — ABNORMAL LOW (ref 135–145)

## 2014-09-29 LAB — CBC
HCT: 28.6 % — ABNORMAL LOW (ref 39.0–52.0)
Hemoglobin: 8.6 g/dL — ABNORMAL LOW (ref 13.0–17.0)
MCH: 28.3 pg (ref 26.0–34.0)
MCHC: 30.1 g/dL (ref 30.0–36.0)
MCV: 94.1 fL (ref 78.0–100.0)
Platelets: 135 10*3/uL — ABNORMAL LOW (ref 150–400)
RBC: 3.04 MIL/uL — ABNORMAL LOW (ref 4.22–5.81)
RDW: 22.1 % — ABNORMAL HIGH (ref 11.5–15.5)
WBC: 8.6 10*3/uL (ref 4.0–10.5)

## 2014-09-29 LAB — URINE CULTURE
Colony Count: NO GROWTH
Culture: NO GROWTH
Special Requests: NORMAL

## 2014-09-29 LAB — HEPARIN LEVEL (UNFRACTIONATED)
HEPARIN UNFRACTIONATED: 0.26 [IU]/mL — AB (ref 0.30–0.70)
HEPARIN UNFRACTIONATED: 0.18 [IU]/mL — AB (ref 0.30–0.70)
Heparin Unfractionated: <0.1 IU/mL — ABNORMAL LOW (ref 0.30–0.70)

## 2014-09-29 LAB — PROTIME-INR
INR: 1.3 (ref 0.00–1.49)
Prothrombin Time: 16.3 seconds — ABNORMAL HIGH (ref 11.6–15.2)

## 2014-09-29 LAB — LACTATE DEHYDROGENASE: LDH: 264 U/L — ABNORMAL HIGH (ref 94–250)

## 2014-09-29 MED ORDER — WARFARIN - PHARMACIST DOSING INPATIENT
Freq: Every day | Status: DC
  Administered 2014-10-01 – 2014-10-02 (×2)

## 2014-09-29 MED ORDER — WARFARIN SODIUM 5 MG PO TABS
5.0000 mg | ORAL_TABLET | Freq: Once | ORAL | Status: AC
  Administered 2014-09-29: 5 mg via ORAL
  Filled 2014-09-29: qty 1

## 2014-09-29 MED ORDER — LOSARTAN POTASSIUM 25 MG PO TABS
25.0000 mg | ORAL_TABLET | Freq: Every day | ORAL | Status: DC
  Administered 2014-09-29 – 2014-10-03 (×5): 25 mg via ORAL
  Filled 2014-09-29 (×5): qty 1

## 2014-09-29 NOTE — Progress Notes (Signed)
Patient ID: [name], male   DOB: [birth year], 73 y.o.   MRN: [id number]   SUBJECTIVE:  12/29 had enteroscopy with bleeding noted in jejunum requiring 3 clips. Small ooze noted after that. Received 1UPRBC 12/29 with hgb up to 9.6 but this am back down to 8.6. Remains on heparin. No BMs over night.   Dizziness resolved. Denies SOB.   MAP 80s-90s  INR 1.46 >>pending  LDH 246>256 >pending  WBC 17> 8.6  LVAD: flow 5.0, speed 9200 rpm, PI 5.2, power 5.3.  No PI events in the last 24 hours.    Scheduled Meds: . amiodarone  200 mg Oral BID  . atorvastatin  40 mg Oral q1800  . budesonide-formoterol  2 puff Inhalation BID  . citalopram  20 mg Oral QHS  . docusate sodium  100 mg Oral q morning - 10a   And  . docusate sodium  200 mg Oral QHS  . levothyroxine  100 mcg Oral QHS  . multivitamin  5 mL Oral Daily  . pantoprazole  40 mg Oral BID  . sodium chloride  3 mL Intravenous Q12H  . tiotropium  18 mcg Inhalation Daily  . cyanocobalamin  1,000 mcg Oral QHS  . vitamin C  500 mg Oral BID   Continuous Infusions: . sodium chloride 250 mL (09/25/14 2258)  . heparin 1,550 Units/hr (09/29/14 0546)   PRN Meds:.sodium chloride, albuterol, sodium chloride    Filed Vitals:   09/28/14 2115 09/28/14 2200 09/29/14 0000 09/29/14 0500  BP:      Pulse: 71 69 71 59  Temp: 97.9 F (36.6 C)  97.9 F (36.6 C) 97.8 F (36.6 C)  TempSrc: Oral  Oral Oral  Resp: 26 20 21 18   Height:      Weight:    208 lb 11.2 oz (94.666 kg)  SpO2: 97% 94% 95% 92%    Intake/Output Summary (Last 24 hours) at 09/29/14 0706 Last data filed at 09/29/14 0600  Gross per 24 hour  Intake 932.89 ml  Output   1500 ml  Net -567.11 ml    LABS: Basic Metabolic Panel:  Recent Labs  09/28/14 0334 09/29/14 0246  NA 130* 133*  K 3.8 3.6  CL 99 104  CO2 23 23  GLUCOSE 144* 109*  BUN 20 16  CREATININE 0.86 0.78  CALCIUM 8.8 8.7   Liver Function Tests: No results for input(s): AST, ALT, ALKPHOS, BILITOT,  PROT, ALBUMIN in the last 72 hours. No results for input(s): LIPASE, AMYLASE in the last 72 hours. CBC:  Recent Labs  09/28/14 1200 09/29/14 0246  WBC 11.3* 8.6  HGB 8.8* 8.6*  HCT 28.4* 28.6*  MCV 93.1 94.1  PLT 137* 135*   Cardiac Enzymes: No results for input(s): CKTOTAL, CKMB, CKMBINDEX, TROPONINI in the last 72 hours. BNP: Invalid input(s): POCBNP D-Dimer: No results for input(s): DDIMER in the last 72 hours. Hemoglobin A1C: No results for input(s): HGBA1C in the last 72 hours. Fasting Lipid Panel: No results for input(s): CHOL, HDL, LDLCALC, TRIG, CHOLHDL, LDLDIRECT in the last 72 hours. Thyroid Function Tests: No results for input(s): TSH, T4TOTAL, T3FREE, THYROIDAB in the last 72 hours.  Invalid input(s): FREET3 Anemia Panel: No results for input(s): VITAMINB12, FOLATE, FERRITIN, TIBC, IRON, RETICCTPCT in the last 72 hours.  RADIOLOGY: Dg Chest Port 1 View  09/28/2014   CLINICAL DATA:  Dyspnea.  EXAM: PORTABLE CHEST - 1 VIEW  COMPARISON:  September 25, 2014.  FINDINGS: Stable cardiomediastinal silhouette. Left ventricular assistance  device is unchanged in position. Status post coronary artery bypass graft. Left-sided pacemaker is unchanged in position. No pneumothorax is noted. Minimal pleural effusions are noted bilaterally. Mild bibasilar subsegmental atelectasis is noted. Ill-defined opacity is seen in left perihilar region which potentially may represent early inflammation.  IMPRESSION: Minimal bilateral pleural effusions. Minimal bibasilar subsegmental atelectasis. Slightly increased ill-defined left perihilar opacity is noted which may represent developing pneumonia. Followup radiographs are recommended.   Electronically Signed   By: Sabino Dick M.D.   On: 09/28/2014 08:15   Dg Chest Port 1 View  09/25/2014   CLINICAL DATA:  Weakness and nausea, shortness of breath  EXAM: PORTABLE CHEST - 1 VIEW  COMPARISON:  07/29/2014  FINDINGS: And LVAD is again identified. The  cardiac shadow remains enlarged. A defibrillator is again seen. Postsurgical changes are again noted. The lungs are well aerated with mild blunting of the costophrenic angles. No focal confluent infiltrate is seen. No sizable effusion is noted.  IMPRESSION: LVAD in appropriate position.  No acute abnormality is noted.   Electronically Signed   By: Inez Catalina M.D.   On: 09/25/2014 14:20    Physical Exam: General: Well appearing. No resp difficulty. Sitting on the side of the bed HEENT: normal Neck: supple. JVP 7-8 cm. Carotids 2+ bilat; no bruits. No lymphadenopathy or thryomegaly appreciated. Cor: LVAD hum present. Lungs: clear Abdomen: soft, nontender, nondistended. No hepatosplenomegaly. No bruits or masses. Good bowel sounds. Driveline: C/D/I; securement device intact and driveline incorporated no erythema.  Extremities: no cyanosis, clubbing, rash, edema Neuro: alert & orientedx3, cranial nerves grossly intact. moves all 4 extremities w/o difficulty. Affect pleasant  TELEMETRY: SR 80s    Assessment:   1) PAF - was in NSR 07/25/14. In atrial fibrillation on 09/21/14 and cardioverted back to NSR. Went back into atrial fibrillation some time after 12/23.  Now NSR on amiodarone.  2) Weakness/Fatigue 3) Chronic systolic HF - s/p LVAD 03/1695 4) Anemia: Hemoglobin with drop from 11.1=>7.4 at this admission. GI bleed Enteroscopy  Bleeding from jejunum 3 clips placed.  5) HTN 6) NSVT 7) Elevated WBC--. 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 He has noted black stool but had not been particularly worried about it because he is taking iron.He had 2 units PRBCs on 12/27. 12/29 had enteroscopy with bleeding noted in jejunum (presumed AVM) requiring 3 clips. Small ooze noted after that. Received 1U PRBC 12/29 with hgb up to 9.6 seems to slowly trending back down to 8.6.  Watch closely.   Continue to follow on heparin  gtt, assess for recurrent bleeding. If no more active bleeding today, resume coumadin this evening.   He was in atrial fibrillation at admission but has gone back to NSR on amiodarone.  Continue amiodarone.   WBC back down 8.6. UA ok. m 9.1>17.  No fevers noted.   I reviewed the LVAD parameters from today, and compared the results to the patient's prior recorded data. No programming changes were made. The LVAD is functioning within specified parameters. The patient performs LVAD self-test daily. LVAD interrogation was negative for any significant power changes, alarms or PI events/speed drops. LVAD equipment check completed and is in good working order. Back-up equipment present. LVAD education done on emergency procedures and precautions and reviewed exit site care.  CLEGG,AMY NP-C  7:06 AM  VAD Team Pager [phone] (7am - 7am) +++VAD ISSUES ONLY+++   Advanced Heart Failure Team  Pager [phone] (M-F; [place])  Please contact [facility] for night-coverage after hours (4p -7a ) and weekends on [email or web address] for all non- LVAD Issues  Patient seen with NP, agree with the above note.  Stable today.  Hemoglobin 8.8 => 8.6. No overt bleeding.  If remains stable through day, restart coumadin this evening.  Can also restart home losartan.  Needs to ambulate=>cardiac rehab.  Remains in NSR.   Loralie Champagne 09/29/2014 7:37 AM

## 2014-09-29 NOTE — Progress Notes (Addendum)
ANTICOAGULATION CONSULT NOTE  Pharmacy Consult for heparin Indication: LVAD  No Known Allergies  Patient Measurements: Height: 6' (182.9 cm) Weight: 208 lb 11.2 oz (94.666 kg) IBW/kg (Calculated) : 77.6  Ht: 73 inches Wt: 102.1 kg  Vital Signs: Temp: 97.8 F (36.6 C) (12/31 0500) Temp Source: Oral (12/31 0500) Pulse Rate: 59 (12/31 0500)  Labs:  Recent Labs  09/27/14 0339 09/27/14 1221  09/28/14 0334 09/28/14 1200 09/29/14 0246 09/29/14 0621  HGB 9.3* 9.0*  < > 9.0* 8.8* 8.6*  --   HCT 29.8* 29.1*  < > 28.9* 28.4* 28.6*  --   PLT 141* 148*  --  139* 137* 135*  --   LABPROT 24.3* 21.5*  --  17.8*  --   --   --   INR 2.17* 1.85*  --  1.46  --   --   --   HEPARINUNFRC  --   --   < > 0.16* 0.31 <0.10* 0.26*  CREATININE 0.81  --   --  0.86  --  0.78  --   < > = values in this interval not displayed.  Estimated Creatinine Clearance: 99.6 mL/min (by C-G formula based on Cr of 0.78).   Medical History: Past Medical History  Diagnosis Date  . Ischemic cardiomyopathy      CABG 2003, PCI 2007  EF 27%(myoview 2012  . Chronic systolic heart failure   . Hyperlipidemia   . Hypothyroidism   . Chronic anticoagulation     Afib and LVAD  . Obesity   . COPD (chronic obstructive pulmonary disease)   . Asbestosis(501)     "6 years in the [place]" (05/24/2013)  . Atrial fibrillation     permanent  . Paroxysmal ventricular tachycardia   . Coronary artery disease   . Implantable cardioverter-defibrillator Medtronic   . Hypertension   . Myocardial infarction 1990's-2000    "2 in  ~ the 1990's; 1 in ~ 2000" (05/24/2013)  . OSA on CPAP   . Depression   . LVAD (left ventricular assist device) present 12/2012    Assessment: 3 YOM s/p LVAD placement Apr '14 who presented to the ED on 12/23 and was found to be in Afib with Hgb 11.1 - cardioverted and sent home. At home pt continued to feel weak with falls on 12/24 and 12/25. Went back to the ED on 12/27 - still in Afib, Hgb down to  7.4, INR 4. 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 LVAD stated to be functioning well.   Pt got endoscopy 12/29, clipping done. Significant bleeding/oozing but that appears to be resolving. No overt bleeding noted overnight. Another unit of blood given 12/29. Hgb trending down this am 9.0>>8.8>>8.6  Will continue heparin for now and restart coumadin as bleeding seems to be resolving. Heparin continues at low end of goal on 1650 units/hr. INR 1.3. Will dose warfarin conservatively.  Goal of Therapy:  INR 2-2.5 (closer to 2 per Dr. Aundra Dubin)  Heparin level goal = 0.3-0.5   Plan:  Warfarin 5mg  tonight Heparin at 1650 units/hr  Heparin level in AM Daily level and CBC  Erin Hearing PharmD., BCPS Clinical Pharmacist Pager [phone] 09/29/2014 7:08 AM

## 2014-09-29 NOTE — Progress Notes (Signed)
     [facility] Progress Note  Subjective:   Hgb this morning 8.6. No BM last pm. On heparin.   Objective:  Vital signs in last 24 hours: Temp:  [97.8 F (36.6 C)-98.2 F (36.8 C)] 98 F (36.7 C) (12/31 0745) Pulse Rate:  [59-84] 64 (12/31 0900) Resp:  [17-31] 21 (12/31 0900) SpO2:  [92 %-98 %] 98 % (12/31 0900) Weight:  [208 lb 11.2 oz (94.666 kg)] 208 lb 11.2 oz (94.666 kg) (12/31 0500) Last BM Date: 09/27/14 General:   Alert,  Well-developed, white male in NAD Heart:  LVAD hum Pulm lungs clear Abdomen:  Soft, nontender and nondistended. Normal bowel sounds, without guarding, and without rebound.   Extremities:  Without edema. Neurologic:  Alert and  oriented x4;  grossly normal neurologically. Psych:  Alert and cooperative. Normal mood and affect.  Intake/Output from previous day: 12/30 0701 - 12/31 0700 In: 932.9 [P.O.:580; I.V.:352.9] Out: 1500 [Urine:1500] Intake/Output this shift: Total I/O In: 48.1 [I.V.:48.1] Out: -   Lab Results:  Recent Labs  09/28/14 0334 09/28/14 1200 09/29/14 0246  WBC 17.0* 11.3* 8.6  HGB 9.0* 8.8* 8.6*  HCT 28.9* 28.4* 28.6*  PLT 139* 137* 135*   BMET  Recent Labs  09/27/14 0339 09/28/14 0334 09/29/14 0246  NA 130* 130* 133*  K 3.7 3.8 3.6  CL 98 99 104  CO2 24 23 23   GLUCOSE 136* 144* 109*  BUN 18 20 16   CREATININE 0.81 0.86 0.78  CALCIUM 9.0 8.8 8.7   LFT No results for input(s): PROT, ALBUMIN, AST, ALT, ALKPHOS, BILITOT, BILIDIR, IBILI in the last 72 hours. PT/INR  Recent Labs  09/28/14 0334 09/29/14 0621  LABPROT 17.8* 16.3*  INR 1.46 1.30   Hepatitis Panel No results for input(s): HEPBSAG, HCVAB, HEPAIGM, HEPBIGM in the last 72 hours.  Dg Chest Port 1 View  09/28/2014   CLINICAL DATA:  Dyspnea.  EXAM: PORTABLE CHEST - 1 VIEW  COMPARISON:  September 25, 2014.  FINDINGS: Stable cardiomediastinal silhouette. Left ventricular assistance device is unchanged in position. Status post coronary  artery bypass graft. Left-sided pacemaker is unchanged in position. No pneumothorax is noted. Minimal pleural effusions are noted bilaterally. Mild bibasilar subsegmental atelectasis is noted. Ill-defined opacity is seen in left perihilar region which potentially may represent early inflammation.  IMPRESSION: Minimal bilateral pleural effusions. Minimal bibasilar subsegmental atelectasis. Slightly increased ill-defined left perihilar opacity is noted which may represent developing pneumonia. Followup radiographs are recommended.   Electronically Signed   By: Sabino Dick M.D.   On: 09/28/2014 08:15    ASSESSMENT/PLAN:   73 yo male with anemia due to GI bleed. Enteroscopy  Showed bleeding from jejunum-3 clips placed. Hgb stable. Has not had BM. If no further bleeding, can restart coumadin this pm.     LOS: 4 days   Hvozdovic, Vita Barley PA-C 09/29/2014, Pager [phone]  [facility] Attending  I have also seen and assessed the patient and agree with the above note. I think his GI bleeding has stopped. Unfortunately he is having epistaxis.  Will defer timing of warfarin to cardiology.  Gatha Mayer, MD, [facility] [phone] (pager) 09/29/2014 4:41 PM

## 2014-09-29 NOTE — Progress Notes (Signed)
CARDIAC REHAB PHASE I   PRE:  Rate/Rhythm: 69 SR    BP: sitting 86    SaO2: 96 RA  MODE:  Ambulation: 290 ft   POST:  Rate/Rhythm: 76 SR with PACs    BP: sitting 102     SaO2: 90 RA  Pt eager to walk. Steady, didn't want to hold to RW. Assist x1. DOE with distance, rest x2 due to DOE and fatigued legs. Declined needing to sit. SaO2 90 RA after walk in recliner. Maintained NSR with a few PACs. Will f/u. [phone]   Josephina Shih [place] CES, ACSM 09/29/2014 10:24 AM

## 2014-09-29 NOTE — Progress Notes (Signed)
5:14 AM        HL this am is <0.1. However yesterday at same rate the level was therapeutic at 0.31 iu/ml . Pt is high bleed risk. Will redraw level stat  to r/o lab error and adjust accordingly.   Curlene Dolphin

## 2014-09-30 DIAGNOSIS — I482 Chronic atrial fibrillation: Secondary | ICD-10-CM

## 2014-09-30 LAB — CBC
HCT: 28.4 % — ABNORMAL LOW (ref 39.0–52.0)
Hemoglobin: 8.4 g/dL — ABNORMAL LOW (ref 13.0–17.0)
MCH: 27.8 pg (ref 26.0–34.0)
MCHC: 29.6 g/dL — ABNORMAL LOW (ref 30.0–36.0)
MCV: 94 fL (ref 78.0–100.0)
PLATELETS: 150 10*3/uL (ref 150–400)
RBC: 3.02 MIL/uL — ABNORMAL LOW (ref 4.22–5.81)
RDW: 21 % — ABNORMAL HIGH (ref 11.5–15.5)
WBC: 7.7 10*3/uL (ref 4.0–10.5)

## 2014-09-30 LAB — LACTATE DEHYDROGENASE: LDH: 256 U/L — AB (ref 94–250)

## 2014-09-30 LAB — BASIC METABOLIC PANEL
Anion gap: 8 (ref 5–15)
BUN: 16 mg/dL (ref 6–23)
CALCIUM: 8.9 mg/dL (ref 8.4–10.5)
CHLORIDE: 105 meq/L (ref 96–112)
CO2: 23 mmol/L (ref 19–32)
Creatinine, Ser: 0.77 mg/dL (ref 0.50–1.35)
GFR calc Af Amer: >90 mL/min (ref >90)
GFR, EST NON AFRICAN AMERICAN: 89 mL/min — AB (ref >90)
GLUCOSE: 100 mg/dL — AB (ref 70–99)
POTASSIUM: 4.1 mmol/L (ref 3.5–5.1)
SODIUM: 136 mmol/L (ref 135–145)

## 2014-09-30 LAB — HEPARIN LEVEL (UNFRACTIONATED)
Heparin Unfractionated: 0.27 IU/mL — ABNORMAL LOW (ref 0.30–0.70)
Heparin Unfractionated: 0.48 IU/mL (ref 0.30–0.70)

## 2014-09-30 LAB — PROTIME-INR
INR: 1.23 (ref 0.00–1.49)
Prothrombin Time: 15.6 seconds — ABNORMAL HIGH (ref 11.6–15.2)

## 2014-09-30 MED ORDER — POLYETHYLENE GLYCOL 3350 17 G PO PACK
17.0000 g | PACK | Freq: Every day | ORAL | Status: DC | PRN
  Administered 2014-09-30 – 2014-10-01 (×2): 17 g via ORAL
  Filled 2014-09-30 (×5): qty 1

## 2014-09-30 MED ORDER — HEPARIN (PORCINE) IN NACL 100-0.45 UNIT/ML-% IJ SOLN
1700.0000 [IU]/h | INTRAMUSCULAR | Status: DC
  Administered 2014-09-30 – 2014-10-02 (×4): 1700 [IU]/h via INTRAVENOUS
  Filled 2014-09-30 (×7): qty 250

## 2014-09-30 MED ORDER — WARFARIN SODIUM 7.5 MG PO TABS
7.5000 mg | ORAL_TABLET | Freq: Once | ORAL | Status: AC
  Administered 2014-09-30: 7.5 mg via ORAL
  Filled 2014-09-30: qty 1

## 2014-09-30 MED ORDER — POLYETHYLENE GLYCOL 3350 17 G PO PACK
17.0000 g | PACK | Freq: Every day | ORAL | Status: DC
  Filled 2014-09-30: qty 1

## 2014-09-30 NOTE — Progress Notes (Signed)
    Progress Note     Subjective  feels okay. A tiny BM (black) this am.    Objective   Vital signs in last 24 hours: Temp:  [97.4 F (36.3 C)-98.1 F (36.7 C)] 97.4 F (36.3 C) (01/01 0700) Pulse Rate:  [60-76] 60 (01/01 0700) Resp:  [18-22] 22 (01/01 0500) SpO2:  [96 %-99 %] 96 % (01/01 0500) Weight:  [209 lb 6.4 oz (94.983 kg)] 209 lb 6.4 oz (94.983 kg) (01/01 0500) Last BM Date: 09/27/14 General:    white male in NAD Abdomen:  Soft, nontender and nondistended. Normal bowel sounds. Extremities:  Without edema. Neurologic:  Alert and oriented,  grossly normal neurologically. Psych:  Cooperative. Normal mood and affect.  Lab Results:  Recent Labs  09/28/14 1200 09/29/14 0246 09/30/14 0223  WBC 11.3* 8.6 7.7  HGB 8.8* 8.6* 8.4*  HCT 28.4* 28.6* 28.4*  PLT 137* 135* 150       Assessment / Plan:    1. 74 yo male with recurrent anemia of coumadin,, s/p enteroscopy with APC TX and endoclip placement to bleeding area in jejunum. Hgb stable at 8.4 since multiple units of blood.  2. Afib / LVAD. On amiodarone. Coumadin restarted yesterday. Hopefully he will not rebleed but if resumes home iron it may be difficult to tell when having melena    LOS: 5 days   Tye Savoy  09/30/2014, 10:17 AM   [facility] Attending  I have also seen and assessed the patient and agree with the above note. First stool since enteroscopy - Hgb stable w/ sl drift. Will follow loosely - call us if something changes  Gatha Mayer, MD, [facility] [phone] (pager) 09/30/2014 10:41 AM

## 2014-09-30 NOTE — Progress Notes (Signed)
CALLED DR. BRACKBILL, PT ASKED FOR A LAXATIVE. ORDERS GIVEN. MONITORING WILL CONTINUE.

## 2014-09-30 NOTE — Progress Notes (Signed)
ANTICOAGULATION CONSULT NOTE  Pharmacy Consult for heparin Indication: LVAD  No Known Allergies  Patient Measurements: Height: 6' (182.9 cm) Weight: 209 lb 6.4 oz (94.983 kg) IBW/kg (Calculated) : 77.6  Ht: 73 inches Wt: 102.1 kg  Vital Signs: Temp: 97.9 F (36.6 C) (12/31 2300) Temp Source: Oral (12/31 2300) Pulse Rate: 65 (01/01 0500)  Labs:  Recent Labs  09/28/14 0334 09/28/14 1200 09/29/14 0246 09/29/14 0621 09/29/14 1550 09/30/14 0223  HGB 9.0* 8.8* 8.6*  --   --  8.4*  HCT 28.9* 28.4* 28.6*  --   --  28.4*  PLT 139* 137* 135*  --   --  150  LABPROT 17.8*  --   --  16.3*  --  15.6*  INR 1.46  --   --  1.30  --  1.23  HEPARINUNFRC 0.16* 0.31 <0.10* 0.26* 0.18* 0.27*  CREATININE 0.86  --  0.78  --   --  0.77    Estimated Creatinine Clearance: 99.9 mL/min (by C-G formula based on Cr of 0.77).   Medical History: Past Medical History  Diagnosis Date  . Ischemic cardiomyopathy      CABG 2003, PCI 2007  EF 27%(myoview 2012  . Chronic systolic heart failure   . Hyperlipidemia   . Hypothyroidism   . Chronic anticoagulation     Afib and LVAD  . Obesity   . COPD (chronic obstructive pulmonary disease)   . Asbestosis(501)     "6 years in the [place]" (05/24/2013)  . Atrial fibrillation     permanent  . Paroxysmal ventricular tachycardia   . Coronary artery disease   . Implantable cardioverter-defibrillator Medtronic   . Hypertension   . Myocardial infarction 1990's-2000    "2 in  ~ the 1990's; 1 in ~ 2000" (05/24/2013)  . OSA on CPAP   . Depression   . LVAD (left ventricular assist device) present 12/2012    Assessment: 79 YOM s/p LVAD placement Apr '14 who presented to the ED on 12/23 and was found to be in Afib with Hgb 11.1 - cardioverted and sent home. At home pt continued to feel weak with falls on 12/24 and 12/25. Went back to the ED on 12/27 - still in Afib, Hgb down to 7.4, INR 4. 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 LVAD stated to be functioning well.   Pt got endoscopy 12/29, clipping done. Significant bleeding/oozing but that appears to be resolving. No overt bleeding noted overnight. Another unit of blood given 12/29. Hgb essentially stable this AM (8.4).  Will continue heparin for now and Coumadin restarted yesterday. Heparin continues at low end of goal on 1650 units/hr. INR 1.23. Will dose warfarin conservatively.  Goal of Therapy:  INR 2-2.5 (closer to 2 per Dr. Aundra Dubin)  Heparin level goal = 0.3-0.5   Plan:  Warfarin 7.5mg  tonight Increase heparin just slightly to 1700 units/hr. Confirm heparin level in 6 hrs. Daily heparin level and CBC  Uvaldo Rising, BCPS  Clinical Pharmacist Pager [phone]  09/30/2014 7:32 AM

## 2014-09-30 NOTE — Progress Notes (Addendum)
Patient ID: [name], male   DOB: 09-Oct-1940, 74 y.o.   MRN: [id number]   SUBJECTIVE:  12/29 had enteroscopy with bleeding noted in jejunum requiring 3 clips. Small ooze noted after that. Received 1UPRBC 12/29 with hgb up to 9.6. Remains on heparin. No BMs over night.   Dizziness resolved. Denies SOB.  Walked yesterday.   Coumadin restarted yesterday.  Hemoglobin 8.4.   MAP 80s-90s  INR 1.23 LDH 246>256 >256  LVAD: flow 5.2, speed 9200 rpm, PI 5.4, power 5.5.  2 PI events in the last 24 hours.    Scheduled Meds: . amiodarone  200 mg Oral BID  . atorvastatin  40 mg Oral q1800  . budesonide-formoterol  2 puff Inhalation BID  . citalopram  20 mg Oral QHS  . docusate sodium  100 mg Oral q morning - 10a   And  . docusate sodium  200 mg Oral QHS  . levothyroxine  100 mcg Oral QHS  . losartan  25 mg Oral Daily  . multivitamin  5 mL Oral Daily  . pantoprazole  40 mg Oral BID  . sodium chloride  3 mL Intravenous Q12H  . tiotropium  18 mcg Inhalation Daily  . cyanocobalamin  1,000 mcg Oral QHS  . vitamin C  500 mg Oral BID  . Warfarin - Pharmacist Dosing Inpatient   Does not apply q1800   Continuous Infusions: . sodium chloride 250 mL (09/25/14 2258)  . heparin 1,650 Units/hr (09/29/14 2328)   PRN Meds:.sodium chloride, albuterol, sodium chloride    Filed Vitals:   09/29/14 1945 09/29/14 2300 09/30/14 0400 09/30/14 0500  BP:      Pulse: 61 61 60 65  Temp: 97.9 F (36.6 C) 97.9 F (36.6 C)    TempSrc: Oral Oral    Resp: 22 20  22   Height:      Weight:    209 lb 6.4 oz (94.983 kg)  SpO2: 99% 98% 98% 96%    Intake/Output Summary (Last 24 hours) at 09/30/14 0724 Last data filed at 09/30/14 0700  Gross per 24 hour  Intake 871.05 ml  Output   1300 ml  Net -428.95 ml    LABS: Basic Metabolic Panel:  Recent Labs  09/29/14 0246 09/30/14 0223  NA 133* 136  K 3.6 4.1  CL 104 105  CO2 23 23  GLUCOSE 109* 100*  BUN 16 16  CREATININE 0.78 0.77  CALCIUM 8.7  8.9   Liver Function Tests: No results for input(s): AST, ALT, ALKPHOS, BILITOT, PROT, ALBUMIN in the last 72 hours. No results for input(s): LIPASE, AMYLASE in the last 72 hours. CBC:  Recent Labs  09/29/14 0246 09/30/14 0223  WBC 8.6 7.7  HGB 8.6* 8.4*  HCT 28.6* 28.4*  MCV 94.1 94.0  PLT 135* 150   Cardiac Enzymes: No results for input(s): CKTOTAL, CKMB, CKMBINDEX, TROPONINI in the last 72 hours. BNP: Invalid input(s): POCBNP D-Dimer: No results for input(s): DDIMER in the last 72 hours. Hemoglobin A1C: No results for input(s): HGBA1C in the last 72 hours. Fasting Lipid Panel: No results for input(s): CHOL, HDL, LDLCALC, TRIG, CHOLHDL, LDLDIRECT in the last 72 hours. Thyroid Function Tests: No results for input(s): TSH, T4TOTAL, T3FREE, THYROIDAB in the last 72 hours.  Invalid input(s): FREET3 Anemia Panel: No results for input(s): VITAMINB12, FOLATE, FERRITIN, TIBC, IRON, RETICCTPCT in the last 72 hours.  RADIOLOGY: Dg Chest Port 1 View  09/28/2014   CLINICAL DATA:  Dyspnea.  EXAM: PORTABLE CHEST -  1 VIEW  COMPARISON:  September 25, 2014.  FINDINGS: Stable cardiomediastinal silhouette. Left ventricular assistance device is unchanged in position. Status post coronary artery bypass graft. Left-sided pacemaker is unchanged in position. No pneumothorax is noted. Minimal pleural effusions are noted bilaterally. Mild bibasilar subsegmental atelectasis is noted. Ill-defined opacity is seen in left perihilar region which potentially may represent early inflammation.  IMPRESSION: Minimal bilateral pleural effusions. Minimal bibasilar subsegmental atelectasis. Slightly increased ill-defined left perihilar opacity is noted which may represent developing pneumonia. Followup radiographs are recommended.   Electronically Signed   By: Sabino Dick M.D.   On: 09/28/2014 08:15   Dg Chest Port 1 View  09/25/2014   CLINICAL DATA:  Weakness and nausea, shortness of breath  EXAM: PORTABLE  CHEST - 1 VIEW  COMPARISON:  07/29/2014  FINDINGS: And LVAD is again identified. The cardiac shadow remains enlarged. A defibrillator is again seen. Postsurgical changes are again noted. The lungs are well aerated with mild blunting of the costophrenic angles. No focal confluent infiltrate is seen. No sizable effusion is noted.  IMPRESSION: LVAD in appropriate position.  No acute abnormality is noted.   Electronically Signed   By: Inez Catalina M.D.   On: 09/25/2014 14:20    Physical Exam: General: Well appearing. No resp difficulty. Sitting on the side of the bed HEENT: normal Neck: supple. JVP 7-8 cm. Carotids 2+ bilat; no bruits. No lymphadenopathy or thryomegaly appreciated. Cor: LVAD hum present. Lungs: clear Abdomen: soft, nontender, nondistended. No hepatosplenomegaly. No bruits or masses. Good bowel sounds. Driveline: C/D/I; securement device intact and driveline incorporated no erythema.  Extremities: no cyanosis, clubbing, rash, edema Neuro: alert & orientedx3, cranial nerves grossly intact. moves all 4 extremities w/o difficulty. Affect pleasant  TELEMETRY: SR 70s    Assessment:   1) PAF - was in NSR 07/25/14. In atrial fibrillation on 09/21/14 and cardioverted back to NSR. Went back into atrial fibrillation some time after 12/23.  Now NSR on amiodarone.  2) Weakness/Fatigue 3) Chronic systolic HF - s/p LVAD 02/2129 4) Anemia: Hemoglobin with drop from 11.1=>7.4 at this admission. GI bleed Enteroscopy  Bleeding from jejunum 3 clips placed.  5) HTN 6) NSVT 7) Elevated WBC--. 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 He has noted black stool but had not been particularly worried about it because he is taking iron.He had 2 units PRBCs on 12/27. 12/29 had enteroscopy with bleeding noted in jejunum (presumed AVM) requiring 3 clips. Small ooze noted after that. Received 1U PRBC 12/29 with hgb up to 9.6.   Hemoglobin basically stable overnight at 8.4.   Continue to follow on heparin gtt, assess for recurrent bleeding. Coumadin restarted yesterday. Will watch in hospital as INR trends up.   He was in atrial fibrillation at admission but has gone back to NSR on amiodarone.  Continue amiodarone.   He can go to 2W today.   I reviewed the LVAD parameters from today, and compared the results to the patient's prior recorded data. No programming changes were made. The LVAD is functioning within specified parameters. The patient performs LVAD self-test daily. LVAD interrogation was negative for any significant power changes, alarms or PI events/speed drops. LVAD equipment check completed and is in good working order. Back-up equipment present. LVAD education done on emergency procedures and precautions and reviewed exit site care.  Loralie Champagne 09/30/2014 7:24 AM

## 2014-09-30 NOTE — Progress Notes (Signed)
ANTICOAGULATION CONSULT NOTE  Pharmacy Consult for heparin Indication: LVAD  No Known Allergies  Patient Measurements: Height: 6' (182.9 cm) Weight: 209 lb 6.4 oz (94.983 kg) IBW/kg (Calculated) : 77.6  Ht: 73 inches Wt: 102.1 kg  Vital Signs: Temp: 98.1 F (36.7 C) (01/01 1434) Temp Source: Oral (01/01 1434) Pulse Rate: 64 (01/01 1434)  Labs:  Recent Labs  09/28/14 0334 09/28/14 1200 09/29/14 0246 09/29/14 0621 09/29/14 1550 09/30/14 0223 09/30/14 1435  HGB 9.0* 8.8* 8.6*  --   --  8.4*  --   HCT 28.9* 28.4* 28.6*  --   --  28.4*  --   PLT 139* 137* 135*  --   --  150  --   LABPROT 17.8*  --   --  16.3*  --  15.6*  --   INR 1.46  --   --  1.30  --  1.23  --   HEPARINUNFRC 0.16* 0.31 <0.10* 0.26* 0.18* 0.27* 0.48  CREATININE 0.86  --  0.78  --   --  0.77  --     Estimated Creatinine Clearance: 99.9 mL/min (by C-G formula based on Cr of 0.77).   Medical History: Past Medical History  Diagnosis Date  . Ischemic cardiomyopathy      CABG 2003, PCI 2007  EF 27%(myoview 2012  . Chronic systolic heart failure   . Hyperlipidemia   . Hypothyroidism   . Chronic anticoagulation     Afib and LVAD  . Obesity   . COPD (chronic obstructive pulmonary disease)   . Asbestosis(501)     "6 years in the [place]" (05/24/2013)  . Atrial fibrillation     permanent  . Paroxysmal ventricular tachycardia   . Coronary artery disease   . Implantable cardioverter-defibrillator [employer]   . Hypertension   . Myocardial infarction 1990's-2000    "2 in  ~ the 1990's; 1 in ~ 2000" (05/24/2013)  . OSA on CPAP   . Depression   . LVAD (left ventricular assist device) present 12/2012    Assessment: 36 YOM s/p LVAD placement Apr '14 who presented to the ED on 12/23 and was found to be in Afib with Hgb 11.1 - cardioverted and sent home. At home pt continued to feel weak with falls on 12/24 and 12/25. Went back to the ED on 12/27 - still in Afib, Hgb down to 7.4, INR 4. 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 LVAD stated to be functioning well.   Pt got endoscopy 12/29, clipping done. Significant bleeding/oozing but that appears to be resolving. No overt bleeding noted overnight. Another unit of blood given 12/29. Hgb essentially stable this AM (8.4).  Will continue heparin for now and Coumadin restarted yesterday. Heparin level low this am at 0.27 and drip increased slightly 1650>1700 uts/hr with pm HL 0.48 at goal. INR 1.23. Will dose warfarin conservatively.  Goal of Therapy:  INR 2-2.5 (closer to 2 per Dr. Aundra Dubin)  Heparin level goal = 0.3-0.5   Plan:  Warfarin 7.5mg  tonight Continue  heparin  1700 units/hr.. Daily heparin level, INR and CBC  Bonnita Nasuti Pharm.D. CPP, BCPS Clinical Pharmacist [phone] 09/30/2014 4:21 PM

## 2014-10-01 LAB — CBC
HCT: 29 % — ABNORMAL LOW (ref 39.0–52.0)
HEMOGLOBIN: 8.8 g/dL — AB (ref 13.0–17.0)
MCH: 29 pg (ref 26.0–34.0)
MCHC: 30.3 g/dL (ref 30.0–36.0)
MCV: 95.7 fL (ref 78.0–100.0)
PLATELETS: 165 10*3/uL (ref 150–400)
RBC: 3.03 MIL/uL — AB (ref 4.22–5.81)
RDW: 20.3 % — AB (ref 11.5–15.5)
WBC: 7.3 10*3/uL (ref 4.0–10.5)

## 2014-10-01 LAB — COMPREHENSIVE METABOLIC PANEL
ALBUMIN: 2.8 g/dL — AB (ref 3.5–5.2)
ALK PHOS: 55 U/L (ref 39–117)
ALT: 24 U/L (ref 0–53)
AST: 37 U/L (ref 0–37)
Anion gap: 7 (ref 5–15)
BUN: 17 mg/dL (ref 6–23)
CHLORIDE: 107 meq/L (ref 96–112)
CO2: 21 mmol/L (ref 19–32)
Calcium: 8.8 mg/dL (ref 8.4–10.5)
Creatinine, Ser: 0.83 mg/dL (ref 0.50–1.35)
GFR calc Af Amer: >90 mL/min (ref >90)
GFR calc non Af Amer: 86 mL/min — ABNORMAL LOW (ref >90)
GLUCOSE: 201 mg/dL — AB (ref 70–99)
POTASSIUM: 3.6 mmol/L (ref 3.5–5.1)
Sodium: 135 mmol/L (ref 135–145)
Total Bilirubin: 0.6 mg/dL (ref 0.3–1.2)
Total Protein: 5.3 g/dL — ABNORMAL LOW (ref 6.0–8.3)

## 2014-10-01 LAB — BASIC METABOLIC PANEL
Anion gap: 5 (ref 5–15)
BUN: 16 mg/dL (ref 6–23)
CO2: 24 mmol/L (ref 19–32)
Calcium: 8.9 mg/dL (ref 8.4–10.5)
Chloride: 109 mEq/L (ref 96–112)
Creatinine, Ser: 0.83 mg/dL (ref 0.50–1.35)
GFR calc Af Amer: >90 mL/min (ref >90)
GFR calc non Af Amer: 86 mL/min — ABNORMAL LOW (ref >90)
Glucose, Bld: 113 mg/dL — ABNORMAL HIGH (ref 70–99)
POTASSIUM: 3.8 mmol/L (ref 3.5–5.1)
SODIUM: 138 mmol/L (ref 135–145)

## 2014-10-01 LAB — PROTIME-INR
INR: 1.19 (ref 0.00–1.49)
Prothrombin Time: 15.3 seconds — ABNORMAL HIGH (ref 11.6–15.2)

## 2014-10-01 LAB — HEPARIN LEVEL (UNFRACTIONATED): Heparin Unfractionated: 0.47 IU/mL (ref 0.30–0.70)

## 2014-10-01 LAB — LACTATE DEHYDROGENASE: LDH: 279 U/L — ABNORMAL HIGH (ref 94–250)

## 2014-10-01 MED ORDER — WARFARIN SODIUM 7.5 MG PO TABS
7.5000 mg | ORAL_TABLET | Freq: Once | ORAL | Status: AC
  Administered 2014-10-01: 7.5 mg via ORAL
  Filled 2014-10-01: qty 1

## 2014-10-01 MED ORDER — ADULT MULTIVITAMIN W/MINERALS CH
1.0000 | ORAL_TABLET | Freq: Every day | ORAL | Status: DC
  Administered 2014-10-02 – 2014-10-03 (×2): 1 via ORAL
  Filled 2014-10-01 (×2): qty 1

## 2014-10-01 MED ORDER — BISACODYL 5 MG PO TBEC
5.0000 mg | DELAYED_RELEASE_TABLET | Freq: Every day | ORAL | Status: DC | PRN
  Administered 2014-10-01: 5 mg via ORAL
  Filled 2014-10-01 (×2): qty 1

## 2014-10-01 NOTE — Progress Notes (Signed)
PHARMACY FOLLOW UP NOTE   Pharmacy Consult for :  Coumadin with Heparin bridging  Indication:  LVAD  Patient Weight : 96 kg  Labs:  Recent Labs  09/28/14 1200 09/29/14 0246 09/29/14 0621 09/29/14 1550 09/30/14 0223 09/30/14 1435 10/01/14 0240 10/01/14 0816  HGB 8.8* 8.6*  --   --  8.4*  --  8.8*  --   HCT 28.4* 28.6*  --   --  28.4*  --  29.0*  --   PLT 137* 135*  --   --  150  --  165  --   LABPROT  --   --  16.3*  --  15.6*  --  15.3*  --   INR  --   --  1.30  --  1.23  --  1.19  --   HEPARINUNFRC 0.31 <0.10* 0.26* 0.18* 0.27* 0.48 0.47  --   CREATININE  --  0.78  --   --  0.77  --  0.83 0.83   Lab Results  Component Value Date   INR 1.19 10/01/2014   INR 1.23 09/30/2014   INR 1.30 09/29/2014    Estimated Creatinine Clearance: 96.7 mL/min (by C-G formula based on Cr of 0.83).  Current Medication[s] Include: Scheduled:  Scheduled:  . amiodarone  200 mg Oral BID  . atorvastatin  40 mg Oral q1800  . budesonide-formoterol  2 puff Inhalation BID  . citalopram  20 mg Oral QHS  . docusate sodium  100 mg Oral q morning - 10a   And  . docusate sodium  200 mg Oral QHS  . levothyroxine  100 mcg Oral QHS  . losartan  25 mg Oral Daily  . multivitamin  5 mL Oral Daily  . pantoprazole  40 mg Oral BID  . sodium chloride  3 mL Intravenous Q12H  . tiotropium  18 mcg Inhalation Daily  . cyanocobalamin  1,000 mcg Oral QHS  . vitamin C  500 mg Oral BID  . Warfarin - Pharmacist Dosing Inpatient   Does not apply q1800   Infusion[s]: Infusions:  . sodium chloride 250 mL (09/25/14 2258)  . heparin 1,700 Units/hr (10/01/14 0741)    Assessment:  32 YOM s/p LVAD placement Apr '14 who presented to the ED on 12/23 and was found to be in Afib with Hgb 11.1 - cardioverted and sent home. At home pt continued to feel weak with falls on 12/24 and 12/25. Went back to the ED on 12/27 - still in Afib, Hgb down to 7.4, INR 4. 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  Heparin level within therapeutic range 0.47.  INR 1.19.  No evidence of bleeding complications noted   Goal: Target INR 2 - 2.5  Heparin Level  0.3 - 0.7 units/ml  LVAD  >  Heparin Level  0.3 - 0.5 units/ml    Plan: 1. Continue Heparin at 1700 units/hr. 2. Repeat Coumadin 7.5 mg po today. 3. Daily Heparin Levels, INR, Platelet counts, CBC.  Monitor for bleeding complications.    Christyan Reger, Craig Guess,  Pharm.D   10/01/2014, 11:58 AM

## 2014-10-01 NOTE — Progress Notes (Signed)
CARDIAC REHAB PHASE I   PRE:  Rate/Rhythm: 62 sinus  BP:  Supine: 86  Sitting:  Standing:    SaO2:  MODE:  Ambulation: 550 ft   POST:  Rate/Rhythem: 78  BP:  Supine:   Sitting: 84  Standing:    SaO2:   Pt tolerated walk well.  Ambulated 550 ft with assist x1.  Only took one standing rest break for fatigue.  Returned to chair after walk with batteries still in versus power source.  Pt had no complaints with walk.  We will f/u on Monday. Alberteen Sam, MA, ACSM RCEP   Luan Pulling, Nita Sells

## 2014-10-01 NOTE — Progress Notes (Signed)
   No stools No melena Hgb 8.8  Seems like bleeding has stopped. Agree w/ plans per cardiology.  Signing off - call if ?  Gatha Mayer, MD, [facility] [phone] (pager) 10/01/2014 12:15 PM

## 2014-10-01 NOTE — Progress Notes (Signed)
Patient ID: [name], male   DOB: [birth year], 74 y.o.   MRN: [id number]   SUBJECTIVE:  12/29 had enteroscopy with bleeding noted in jejunum requiring 3 clips. Small ooze noted after that. Received 1UPRBC 12/29 with hgb up to 9.6. Remains on heparin. No BMs over night.   Dizziness resolved. Denies SOB.  Walked yesterday.   Coumadin restarted 12/31.  Hemoglobin 8.8, INR 1.2.   MAP 80s-90s  LDH 246>256 >256>279  LVAD: flow 6, speed 9200 rpm, PI 4.8, power 6.  1 PI event in the last 24 hours.    Scheduled Meds: . amiodarone  200 mg Oral BID  . atorvastatin  40 mg Oral q1800  . budesonide-formoterol  2 puff Inhalation BID  . citalopram  20 mg Oral QHS  . docusate sodium  100 mg Oral q morning - 10a   And  . docusate sodium  200 mg Oral QHS  . levothyroxine  100 mcg Oral QHS  . losartan  25 mg Oral Daily  . multivitamin  5 mL Oral Daily  . pantoprazole  40 mg Oral BID  . sodium chloride  3 mL Intravenous Q12H  . tiotropium  18 mcg Inhalation Daily  . cyanocobalamin  1,000 mcg Oral QHS  . vitamin C  500 mg Oral BID  . Warfarin - Pharmacist Dosing Inpatient   Does not apply q1800   Continuous Infusions: . sodium chloride 250 mL (09/25/14 2258)  . heparin 1,700 Units/hr (10/01/14 0741)   PRN Meds:.sodium chloride, albuterol, bisacodyl, polyethylene glycol, sodium chloride    Filed Vitals:   09/30/14 1020 09/30/14 1434 09/30/14 1950 10/01/14 0300  BP:      Pulse:  64 70 70  Temp:  98.1 F (36.7 C) 97.9 F (36.6 C) 97.8 F (36.6 C)  TempSrc:  Oral Oral Oral  Resp:    20  Height:      Weight:    211 lb 10.3 oz (96 kg)  SpO2: 98% 97%  92%    Intake/Output Summary (Last 24 hours) at 10/01/14 0751 Last data filed at 09/30/14 1700  Gross per 24 hour  Intake 516.63 ml  Output      0 ml  Net 516.63 ml    LABS: Basic Metabolic Panel:  Recent Labs  09/30/14 0223 10/01/14 0240  NA 136 138  K 4.1 3.8  CL 105 109  CO2 23 24  GLUCOSE 100* 113*  BUN 16 16    CREATININE 0.77 0.83  CALCIUM 8.9 8.9   Liver Function Tests: No results for input(s): AST, ALT, ALKPHOS, BILITOT, PROT, ALBUMIN in the last 72 hours. No results for input(s): LIPASE, AMYLASE in the last 72 hours. CBC:  Recent Labs  09/30/14 0223 10/01/14 0240  WBC 7.7 7.3  HGB 8.4* 8.8*  HCT 28.4* 29.0*  MCV 94.0 95.7  PLT 150 165   Cardiac Enzymes: No results for input(s): CKTOTAL, CKMB, CKMBINDEX, TROPONINI in the last 72 hours. BNP: Invalid input(s): POCBNP D-Dimer: No results for input(s): DDIMER in the last 72 hours. Hemoglobin A1C: No results for input(s): HGBA1C in the last 72 hours. Fasting Lipid Panel: No results for input(s): CHOL, HDL, LDLCALC, TRIG, CHOLHDL, LDLDIRECT in the last 72 hours. Thyroid Function Tests: No results for input(s): TSH, T4TOTAL, T3FREE, THYROIDAB in the last 72 hours.  Invalid input(s): FREET3 Anemia Panel: No results for input(s): VITAMINB12, FOLATE, FERRITIN, TIBC, IRON, RETICCTPCT in the last 72 hours.  RADIOLOGY: Dg Chest Port 1 View  09/28/2014  CLINICAL DATA:  Dyspnea.  EXAM: PORTABLE CHEST - 1 VIEW  COMPARISON:  September 25, 2014.  FINDINGS: Stable cardiomediastinal silhouette. Left ventricular assistance device is unchanged in position. Status post coronary artery bypass graft. Left-sided pacemaker is unchanged in position. No pneumothorax is noted. Minimal pleural effusions are noted bilaterally. Mild bibasilar subsegmental atelectasis is noted. Ill-defined opacity is seen in left perihilar region which potentially may represent early inflammation.  IMPRESSION: Minimal bilateral pleural effusions. Minimal bibasilar subsegmental atelectasis. Slightly increased ill-defined left perihilar opacity is noted which may represent developing pneumonia. Followup radiographs are recommended.   Electronically Signed   By: Sabino Dick M.D.   On: 09/28/2014 08:15   Dg Chest Port 1 View  09/25/2014   CLINICAL DATA:  Weakness and nausea,  shortness of breath  EXAM: PORTABLE CHEST - 1 VIEW  COMPARISON:  07/29/2014  FINDINGS: And LVAD is again identified. The cardiac shadow remains enlarged. A defibrillator is again seen. Postsurgical changes are again noted. The lungs are well aerated with mild blunting of the costophrenic angles. No focal confluent infiltrate is seen. No sizable effusion is noted.  IMPRESSION: LVAD in appropriate position.  No acute abnormality is noted.   Electronically Signed   By: Inez Catalina M.D.   On: 09/25/2014 14:20    Physical Exam: General: Well appearing. No resp difficulty. Sitting on the side of the bed HEENT: normal Neck: supple. JVP 7-8 cm. Carotids 2+ bilat; no bruits. No lymphadenopathy or thryomegaly appreciated. Cor: LVAD hum present. Lungs: clear Abdomen: soft, nontender, nondistended. No hepatosplenomegaly. No bruits or masses. Good bowel sounds. Driveline: C/D/I; securement device intact and driveline incorporated no erythema.  Extremities: no cyanosis, clubbing, rash, edema Neuro: alert & orientedx3, cranial nerves grossly intact. moves all 4 extremities w/o difficulty. Affect pleasant  TELEMETRY: SR 70s    Assessment:   1) PAF - was in NSR 07/25/14. In atrial fibrillation on 09/21/14 and cardioverted back to NSR. Went back into atrial fibrillation some time after 12/23.  Now NSR on amiodarone.  2) Weakness/Fatigue 3) Chronic systolic HF - s/p LVAD 12/6268 4) Anemia: Hemoglobin with drop from 11.1=>7.4 at this admission. GI bleed Enteroscopy  Bleeding from jejunum 3 clips placed.  5) HTN 6) NSVT 7) Elevated WBC--. 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 He has noted black stool but had not been particularly worried about it because he is taking iron.He had 2 units PRBCs on 12/27. 12/29 had enteroscopy with bleeding noted in jejunum (presumed AVM) requiring 3 clips. Small ooze noted after that. Received 1U PRBC  12/29 with hgb up to 9.6.  Hemoglobin stable overnight at 8.8.   Continue to follow on heparin gtt, assess for recurrent bleeding. Coumadin restarted 12/31. Will watch in hospital as INR trends up.  If hgb remains stable on Monday, will plan to let him go home with Lovenox bridge.   He was in atrial fibrillation at admission but has gone back to NSR on amiodarone.  Continue amiodarone.   I reviewed the LVAD parameters from today, and compared the results to the patient's prior recorded data. No programming changes were made. The LVAD is functioning within specified parameters. The patient performs LVAD self-test daily. LVAD interrogation was negative for any significant power changes, alarms or PI events/speed drops. LVAD equipment check completed and is in good working order. Back-up equipment present. LVAD education done on emergency procedures and precautions and reviewed exit site  care.  Loralie Champagne 10/01/2014 7:51 AM

## 2014-10-02 LAB — BASIC METABOLIC PANEL
Anion gap: 4 — ABNORMAL LOW (ref 5–15)
BUN: 13 mg/dL (ref 6–23)
CHLORIDE: 109 meq/L (ref 96–112)
CO2: 23 mmol/L (ref 19–32)
Calcium: 8.9 mg/dL (ref 8.4–10.5)
Creatinine, Ser: 0.68 mg/dL (ref 0.50–1.35)
GFR calc Af Amer: >90 mL/min (ref >90)
GFR calc non Af Amer: >90 mL/min (ref >90)
GLUCOSE: 120 mg/dL — AB (ref 70–99)
POTASSIUM: 3.8 mmol/L (ref 3.5–5.1)
Sodium: 136 mmol/L (ref 135–145)

## 2014-10-02 LAB — CBC
HEMATOCRIT: 26.8 % — AB (ref 39.0–52.0)
HEMOGLOBIN: 8.1 g/dL — AB (ref 13.0–17.0)
MCH: 28.1 pg (ref 26.0–34.0)
MCHC: 30.2 g/dL (ref 30.0–36.0)
MCV: 93.1 fL (ref 78.0–100.0)
Platelets: 164 10*3/uL (ref 150–400)
RBC: 2.88 MIL/uL — ABNORMAL LOW (ref 4.22–5.81)
RDW: 19.7 % — AB (ref 11.5–15.5)
WBC: 6.9 10*3/uL (ref 4.0–10.5)

## 2014-10-02 LAB — PROTIME-INR
INR: 1.3 (ref 0.00–1.49)
PROTHROMBIN TIME: 16.3 s — AB (ref 11.6–15.2)

## 2014-10-02 LAB — HEPARIN LEVEL (UNFRACTIONATED): HEPARIN UNFRACTIONATED: 0.32 [IU]/mL (ref 0.30–0.70)

## 2014-10-02 LAB — LACTATE DEHYDROGENASE: LDH: 248 U/L (ref 94–250)

## 2014-10-02 MED ORDER — WARFARIN SODIUM 7.5 MG PO TABS
7.5000 mg | ORAL_TABLET | Freq: Once | ORAL | Status: AC
  Administered 2014-10-02: 7.5 mg via ORAL
  Filled 2014-10-02: qty 1

## 2014-10-02 NOTE — Progress Notes (Signed)
Patient ID: [name], male   DOB: 1941-05-16, 74 y.o.   MRN: [id number]   SUBJECTIVE:  12/29 had enteroscopy with bleeding noted in jejunum requiring 3 clips. Small ooze noted after that. Received 1UPRBC 12/29 with hgb up to 9.6.  Dizziness resolved. Denies SOB.  Walked yesterday twice.  Hemoglobin a little lower today.  No melena.    Coumadin restarted 12/31.   Remains on heparin.  Hemoglobin 8.1, INR 1.3.   MAP 80s-90s  LDH 246>256 >256>279>248  LVAD: flow 5.2, speed 9200 rpm, PI 5.4, power 5.7.  No PI events in the last 24 hours.    Scheduled Meds: . amiodarone  200 mg Oral BID  . atorvastatin  40 mg Oral q1800  . budesonide-formoterol  2 puff Inhalation BID  . citalopram  20 mg Oral QHS  . docusate sodium  100 mg Oral q morning - 10a   And  . docusate sodium  200 mg Oral QHS  . levothyroxine  100 mcg Oral QHS  . losartan  25 mg Oral Daily  . multivitamin with minerals  1 tablet Oral Daily  . pantoprazole  40 mg Oral BID  . sodium chloride  3 mL Intravenous Q12H  . tiotropium  18 mcg Inhalation Daily  . cyanocobalamin  1,000 mcg Oral QHS  . vitamin C  500 mg Oral BID  . Warfarin - Pharmacist Dosing Inpatient   Does not apply q1800   Continuous Infusions: . sodium chloride 250 mL (09/25/14 2258)  . heparin 1,700 Units/hr (10/01/14 2217)   PRN Meds:.sodium chloride, albuterol, bisacodyl, polyethylene glycol, sodium chloride    Filed Vitals:   10/01/14 2000 10/01/14 2058 10/01/14 2204 10/02/14 0400  BP:      Pulse:    61  Temp:  97.5 F (36.4 C)  98 F (36.7 C)  TempSrc:  Oral  Oral  Resp: 20   18  Height:      Weight:    212 lb 12.8 oz (96.525 kg)  SpO2: 95%  96% 97%    Intake/Output Summary (Last 24 hours) at 10/02/14 0736 Last data filed at 10/02/14 0400  Gross per 24 hour  Intake    600 ml  Output    600 ml  Net      0 ml    LABS: Basic Metabolic Panel:  Recent Labs  10/01/14 0816 10/02/14 0340  NA 135 136  K 3.6 3.8  CL 107 109  CO2  21 23  GLUCOSE 201* 120*  BUN 17 13  CREATININE 0.83 0.68  CALCIUM 8.8 8.9   Liver Function Tests:  Recent Labs  10/01/14 0816  AST 37  ALT 24  ALKPHOS 55  BILITOT 0.6  PROT 5.3*  ALBUMIN 2.8*   No results for input(s): LIPASE, AMYLASE in the last 72 hours. CBC:  Recent Labs  10/01/14 0240 10/02/14 0340  WBC 7.3 6.9  HGB 8.8* 8.1*  HCT 29.0* 26.8*  MCV 95.7 93.1  PLT 165 164   Cardiac Enzymes: No results for input(s): CKTOTAL, CKMB, CKMBINDEX, TROPONINI in the last 72 hours. BNP: Invalid input(s): POCBNP D-Dimer: No results for input(s): DDIMER in the last 72 hours. Hemoglobin A1C: No results for input(s): HGBA1C in the last 72 hours. Fasting Lipid Panel: No results for input(s): CHOL, HDL, LDLCALC, TRIG, CHOLHDL, LDLDIRECT in the last 72 hours. Thyroid Function Tests: No results for input(s): TSH, T4TOTAL, T3FREE, THYROIDAB in the last 72 hours.  Invalid input(s): FREET3 Anemia Panel: No results  for input(s): VITAMINB12, FOLATE, FERRITIN, TIBC, IRON, RETICCTPCT in the last 72 hours.  RADIOLOGY: Dg Chest Port 1 View  09/28/2014   CLINICAL DATA:  Dyspnea.  EXAM: PORTABLE CHEST - 1 VIEW  COMPARISON:  September 25, 2014.  FINDINGS: Stable cardiomediastinal silhouette. Left ventricular assistance device is unchanged in position. Status post coronary artery bypass graft. Left-sided pacemaker is unchanged in position. No pneumothorax is noted. Minimal pleural effusions are noted bilaterally. Mild bibasilar subsegmental atelectasis is noted. Ill-defined opacity is seen in left perihilar region which potentially may represent early inflammation.  IMPRESSION: Minimal bilateral pleural effusions. Minimal bibasilar subsegmental atelectasis. Slightly increased ill-defined left perihilar opacity is noted which may represent developing pneumonia. Followup radiographs are recommended.   Electronically Signed   By: Sabino Dick M.D.   On: 09/28/2014 08:15   Dg Chest Port 1  View  09/25/2014   CLINICAL DATA:  Weakness and nausea, shortness of breath  EXAM: PORTABLE CHEST - 1 VIEW  COMPARISON:  07/29/2014  FINDINGS: And LVAD is again identified. The cardiac shadow remains enlarged. A defibrillator is again seen. Postsurgical changes are again noted. The lungs are well aerated with mild blunting of the costophrenic angles. No focal confluent infiltrate is seen. No sizable effusion is noted.  IMPRESSION: LVAD in appropriate position.  No acute abnormality is noted.   Electronically Signed   By: Inez Catalina M.D.   On: 09/25/2014 14:20    Physical Exam: General: Well appearing. No resp difficulty. Sitting on the side of the bed HEENT: normal Neck: supple. JVP 7 cm. Carotids 2+ bilat; no bruits. No lymphadenopathy or thryomegaly appreciated. Cor: LVAD hum present. Lungs: clear Abdomen: soft, nontender, nondistended. No hepatosplenomegaly. No bruits or masses. Good bowel sounds. Driveline: C/D/I; securement device intact and driveline incorporated no erythema.  Extremities: no cyanosis, clubbing, rash, edema Neuro: alert & orientedx3, cranial nerves grossly intact. moves all 4 extremities w/o difficulty. Affect pleasant  TELEMETRY: SR 70s    Assessment:   1) PAF - was in NSR 07/25/14. In atrial fibrillation on 09/21/14 and cardioverted back to NSR. Went back into atrial fibrillation some time after 12/23.  Now NSR on amiodarone.  2) Weakness/Fatigue 3) Chronic systolic HF - s/p LVAD 10/9516 4) Anemia: Hemoglobin with drop from 11.1=>7.4 at this admission. GI bleed Enteroscopy  Bleeding from jejunum 3 clips placed.  5) HTN 6) NSVT 7) Elevated WBC--. 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 He has noted black stool but had not been particularly worried about it because he is taking iron.He had 2 units PRBCs on 12/27. 12/29 had enteroscopy with bleeding noted in jejunum (presumed AVM) requiring  3 clips. Small ooze noted after that. Received 1U PRBC 12/29 with hgb up to 9.6.  Hemoglobin down a bit overnight from 8.8 to 8.1 but no melena.    Continue to follow on heparin gtt, assess for recurrent bleeding. Coumadin restarted 12/31.  If hgb remains stable to higher on Monday with no melena, will plan to let him go home with Lovenox bridge.   He was in atrial fibrillation at admission but has gone back to NSR on amiodarone.  Continue amiodarone.   I reviewed the LVAD parameters from today, and compared the results to the patient's prior recorded data. No programming changes were made. The LVAD is functioning within specified parameters. The patient performs LVAD self-test daily. LVAD interrogation was negative for any significant power changes, alarms  or PI events/speed drops. LVAD equipment check completed and is in good working order. Back-up equipment present. LVAD education done on emergency procedures and precautions and reviewed exit site care.  Loralie Champagne 10/02/2014 7:36 AM

## 2014-10-02 NOTE — Progress Notes (Signed)
PHARMACY NOTE  Pharmacy Consult :  74 y.o. male is currently on Coumadin with Heparin bridging IV Heparin for LVAD.   Dosing Wt :  96 kg  Hematology :  Recent Labs  09/29/14 1550 09/30/14 0223 09/30/14 1435 10/01/14 0240 10/01/14 0816 10/02/14 0340  HGB  --  8.4*  --  8.8*  --  8.1*  HCT  --  28.4*  --  29.0*  --  26.8*  PLT  --  150  --  165  --  164  LABPROT  --  15.6*  --  15.3*  --  16.3*  INR  --  1.23  --  1.19  --  1.30  HEPARINUNFRC 0.18* 0.27* 0.48 0.47  --  0.32  CREATININE  --  0.77  --  0.83 0.83 0.68    Current Medication[s] Include: Scheduled:  Scheduled:  . amiodarone  200 mg Oral BID  . atorvastatin  40 mg Oral q1800  . budesonide-formoterol  2 puff Inhalation BID  . citalopram  20 mg Oral QHS  . docusate sodium  100 mg Oral q morning - 10a   And  . docusate sodium  200 mg Oral QHS  . levothyroxine  100 mcg Oral QHS  . losartan  25 mg Oral Daily  . multivitamin with minerals  1 tablet Oral Daily  . pantoprazole  40 mg Oral BID  . sodium chloride  3 mL Intravenous Q12H  . tiotropium  18 mcg Inhalation Daily  . cyanocobalamin  1,000 mcg Oral QHS  . vitamin C  500 mg Oral BID  . Warfarin    7.5 mg Once 1800 pm   Infusion[s]: Infusions:  . sodium chloride 250 mL (09/25/14 2258)  . heparin 1,700 Units/hr (10/01/14 2217)   Assessment :  Today's INR slowly trending upward.   INR  1.3.    Heparin level 0.32 units/ml.  No evidence of bleeding complications observed.  Goal :  INR goal is 2 - 2.5  Heparin goal is 0.3 - 0.5.  Plan : 1. Heparin will be continued at the same rate.   Follow-up next Heparin level with AM labs. 2. Repeat Coumadin 7.5 mg today. 3. Daily Heparin level, INR, CBC, and Monitor for bleeding complications.  Follow [place] counts.  Danyl Deems, Craig Guess,  Pharm.D  10/02/2014  11:33 AM

## 2014-10-02 NOTE — Progress Notes (Signed)
Change pt weekly driveline dressing to llq. Pt tolerated well. Site wnl no odor. Pt states he doesn't use the biopatch because it causes irritation. Monitoring will continue.

## 2014-10-03 LAB — PREPARE RBC (CROSSMATCH)

## 2014-10-03 LAB — BASIC METABOLIC PANEL
Anion gap: 5 (ref 5–15)
BUN: 12 mg/dL (ref 6–23)
CO2: 25 mmol/L (ref 19–32)
CREATININE: 0.73 mg/dL (ref 0.50–1.35)
Calcium: 9.2 mg/dL (ref 8.4–10.5)
Chloride: 108 mEq/L (ref 96–112)
GFR calc Af Amer: >90 mL/min (ref >90)
GLUCOSE: 141 mg/dL — AB (ref 70–99)
POTASSIUM: 3.9 mmol/L (ref 3.5–5.1)
Sodium: 138 mmol/L (ref 135–145)

## 2014-10-03 LAB — CBC
HCT: 26.7 % — ABNORMAL LOW (ref 39.0–52.0)
Hemoglobin: 8.1 g/dL — ABNORMAL LOW (ref 13.0–17.0)
MCH: 28.3 pg (ref 26.0–34.0)
MCHC: 30.3 g/dL (ref 30.0–36.0)
MCV: 93.4 fL (ref 78.0–100.0)
PLATELETS: 180 10*3/uL (ref 150–400)
RBC: 2.86 MIL/uL — AB (ref 4.22–5.81)
RDW: 19.5 % — ABNORMAL HIGH (ref 11.5–15.5)
WBC: 8.2 10*3/uL (ref 4.0–10.5)

## 2014-10-03 LAB — PROTIME-INR
INR: 1.72 — AB (ref 0.00–1.49)
Prothrombin Time: 20.3 seconds — ABNORMAL HIGH (ref 11.6–15.2)

## 2014-10-03 LAB — LACTATE DEHYDROGENASE: LDH: 251 U/L — ABNORMAL HIGH (ref 94–250)

## 2014-10-03 LAB — OCCULT BLOOD X 1 CARD TO LAB, STOOL: FECAL OCCULT BLD: POSITIVE — AB

## 2014-10-03 LAB — HEPARIN LEVEL (UNFRACTIONATED): Heparin Unfractionated: 0.11 IU/mL — ABNORMAL LOW (ref 0.30–0.70)

## 2014-10-03 MED ORDER — SODIUM CHLORIDE 0.9 % IV SOLN: Freq: Once | INTRAVENOUS | Status: DC

## 2014-10-03 MED ORDER — WARFARIN SODIUM 5 MG PO TABS: ORAL_TABLET | ORAL | Status: DC

## 2014-10-03 MED ORDER — PANTOPRAZOLE SODIUM 40 MG PO TBEC: 40.0000 mg | DELAYED_RELEASE_TABLET | Freq: Two times a day (BID) | ORAL | Status: DC

## 2014-10-03 MED ORDER — LOSARTAN POTASSIUM 50 MG PO TABS: 25.0000 mg | ORAL_TABLET | Freq: Every day | ORAL | Status: DC

## 2014-10-03 MED ORDER — DOCUSATE SODIUM 100 MG PO CAPS: 100.0000 mg | ORAL_CAPSULE | Freq: Two times a day (BID) | ORAL | Status: DC

## 2014-10-03 MED ORDER — AMIODARONE HCL 200 MG PO TABS: 200.0000 mg | ORAL_TABLET | Freq: Two times a day (BID) | ORAL | Status: DC

## 2014-10-03 NOTE — Progress Notes (Signed)
CARDIAC REHAB PHASE I   PRE:  Rate/Rhythm: 63 SR  BP:  Supine:   Sitting: 88 map  Standing:    SaO2: 96%RA  MODE:  Ambulation: 550 ft   POST:  Rate/Rhythm: 79 SR  BP:  Supine:   Sitting: 82 map  Standing:    SaO2: 91%RA [id number] Pt walked 550 ft with steady gait stopping three times to rest. C/o legs hurting and some SOB during rest stops. To recliner after walk. Wants to go home. Left on batteries and notified RN as pt's wife stated she might walk with him again soon after he rests some.   Graylon Good, RN BSN  10/03/2014 9:50 AM

## 2014-10-03 NOTE — Progress Notes (Addendum)
PHARMACY NOTE  Pharmacy Consult :  74 y.o. male is currently on Coumadin with Heparin bridging IV Heparin for LVAD.   Dosing Wt :  96 kg  Hematology :  Recent Labs  09/30/14 1435 10/01/14 0240 10/01/14 0816 10/02/14 0340 10/03/14 0415  HGB  --  8.8*  --  8.1* 8.1*  HCT  --  29.0*  --  26.8* 26.7*  PLT  --  165  --  164 180  LABPROT  --  15.3*  --  16.3* 20.3*  INR  --  1.19  --  1.30 1.72*  HEPARINUNFRC 0.48 0.47  --  0.32 0.11*  CREATININE  --  0.83 0.83 0.68 0.73    Current Medication[s] Include: Scheduled:  Scheduled:  . amiodarone  200 mg Oral BID  . atorvastatin  40 mg Oral q1800  . budesonide-formoterol  2 puff Inhalation BID  . citalopram  20 mg Oral QHS  . docusate sodium  100 mg Oral q morning - 10a   And  . docusate sodium  200 mg Oral QHS  . levothyroxine  100 mcg Oral QHS  . losartan  25 mg Oral Daily  . multivitamin with minerals  1 tablet Oral Daily  . pantoprazole  40 mg Oral BID  . sodium chloride  3 mL Intravenous Q12H  . tiotropium  18 mcg Inhalation Daily  . cyanocobalamin  1,000 mcg Oral QHS  . vitamin C  500 mg Oral BID  . Warfarin    7.5 mg Once 1800 pm   Infusion[s]: Infusions:  . sodium chloride 250 mL (09/25/14 2258)   Assessment :  Today's INR trending upward.   INR  1.3 > 1.7 after 3 doses of Coumadin 7.5mg .    Heparin level 0.32 units/ml.  No evidence of bleeding complications observed.  Goal :  INR goal is 1.8-2.3  Heparin goal is 0.3 - 0.5.  Plan : 1. D/C Heparin  - plan 1 uts prbc then d/c home. 2. D/c home on Coumadin 2.5mg  x1 today then 5mg  x2 days and f/u thur in clinic with CBC/INR   Bonnita Nasuti Pharm.D. CPP, BCPS Clinical Pharmacist [phone] 10/03/2014 10:22 AM

## 2014-10-03 NOTE — Progress Notes (Signed)
Patient ID: [name], male   DOB: September 15, 1941, 74 y.o.   MRN: [id number]   SUBJECTIVE:  12/29 had enteroscopy with bleeding noted in jejunum requiring 3 clips. Small ooze noted after that. Received 1UPRBC 12/29 with hgb up to 9.6.  Dizziness resolved. Denies SOB.  Walked yesterday twice.  Hemoglobin unchanged. One black stool.  FOB +   Coumadin restarted 12/31.   Remains on heparin.  Hemoglobin 8.1, INR 1.7.   MAP 80s-90s  LDH 251  LVAD: flow 5.3, speed 9200 rpm, PI 4.9, power 5.7.  1 PI noted in the last 24 hours.     Scheduled Meds: . sodium chloride   Intravenous Once  . amiodarone  200 mg Oral BID  . atorvastatin  40 mg Oral q1800  . budesonide-formoterol  2 puff Inhalation BID  . citalopram  20 mg Oral QHS  . docusate sodium  100 mg Oral q morning - 10a   And  . docusate sodium  200 mg Oral QHS  . levothyroxine  100 mcg Oral QHS  . losartan  25 mg Oral Daily  . multivitamin with minerals  1 tablet Oral Daily  . pantoprazole  40 mg Oral BID  . sodium chloride  3 mL Intravenous Q12H  . tiotropium  18 mcg Inhalation Daily  . cyanocobalamin  1,000 mcg Oral QHS  . vitamin C  500 mg Oral BID  . Warfarin - Pharmacist Dosing Inpatient   Does not apply q1800   Continuous Infusions: . sodium chloride 250 mL (09/25/14 2258)  . heparin 1,700 Units/hr (10/02/14 1418)   PRN Meds:.sodium chloride, albuterol, bisacodyl, polyethylene glycol, sodium chloride    Filed Vitals:   10/02/14 1947 10/02/14 2202 10/03/14 0500 10/03/14 0558  BP:      Pulse: 58     Temp: 97.5 F (36.4 C)  97.8 F (36.6 C)   TempSrc: Oral  Oral   Resp: 18  18   Height:      Weight:    214 lb 15.2 oz (97.5 kg)  SpO2: 98% 98% 96%     Intake/Output Summary (Last 24 hours) at 10/03/14 0905 Last data filed at 10/03/14 0700  Gross per 24 hour  Intake    720 ml  Output    925 ml  Net   -205 ml    LABS: Basic Metabolic Panel:  Recent Labs  10/02/14 0340 10/03/14 0415  NA 136 138  K 3.8 3.9   CL 109 108  CO2 23 25  GLUCOSE 120* 141*  BUN 13 12  CREATININE 0.68 0.73  CALCIUM 8.9 9.2   Liver Function Tests:  Recent Labs  10/01/14 0816  AST 37  ALT 24  ALKPHOS 55  BILITOT 0.6  PROT 5.3*  ALBUMIN 2.8*   No results for input(s): LIPASE, AMYLASE in the last 72 hours. CBC:  Recent Labs  10/02/14 0340 10/03/14 0415  WBC 6.9 8.2  HGB 8.1* 8.1*  HCT 26.8* 26.7*  MCV 93.1 93.4  PLT 164 180   Cardiac Enzymes: No results for input(s): CKTOTAL, CKMB, CKMBINDEX, TROPONINI in the last 72 hours. BNP: Invalid input(s): POCBNP D-Dimer: No results for input(s): DDIMER in the last 72 hours. Hemoglobin A1C: No results for input(s): HGBA1C in the last 72 hours. Fasting Lipid Panel: No results for input(s): CHOL, HDL, LDLCALC, TRIG, CHOLHDL, LDLDIRECT in the last 72 hours. Thyroid Function Tests: No results for input(s): TSH, T4TOTAL, T3FREE, THYROIDAB in the last 72 hours.  Invalid input(s): FREET3  Anemia Panel: No results for input(s): VITAMINB12, FOLATE, FERRITIN, TIBC, IRON, RETICCTPCT in the last 72 hours.  RADIOLOGY: Dg Chest Port 1 View  09/28/2014   CLINICAL DATA:  Dyspnea.  EXAM: PORTABLE CHEST - 1 VIEW  COMPARISON:  September 25, 2014.  FINDINGS: Stable cardiomediastinal silhouette. Left ventricular assistance device is unchanged in position. Status post coronary artery bypass graft. Left-sided pacemaker is unchanged in position. No pneumothorax is noted. Minimal pleural effusions are noted bilaterally. Mild bibasilar subsegmental atelectasis is noted. Ill-defined opacity is seen in left perihilar region which potentially may represent early inflammation.  IMPRESSION: Minimal bilateral pleural effusions. Minimal bibasilar subsegmental atelectasis. Slightly increased ill-defined left perihilar opacity is noted which may represent developing pneumonia. Followup radiographs are recommended.   Electronically Signed   By: Sabino Dick M.D.   On: 09/28/2014 08:15   Dg  Chest Port 1 View  09/25/2014   CLINICAL DATA:  Weakness and nausea, shortness of breath  EXAM: PORTABLE CHEST - 1 VIEW  COMPARISON:  07/29/2014  FINDINGS: And LVAD is again identified. The cardiac shadow remains enlarged. A defibrillator is again seen. Postsurgical changes are again noted. The lungs are well aerated with mild blunting of the costophrenic angles. No focal confluent infiltrate is seen. No sizable effusion is noted.  IMPRESSION: LVAD in appropriate position.  No acute abnormality is noted.   Electronically Signed   By: Inez Catalina M.D.   On: 09/25/2014 14:20    Physical Exam: General: Well appearing. No resp difficulty. Lying in bed.  HEENT: normal Neck: supple. JVP 7 cm. Carotids 2+ bilat; no bruits. No lymphadenopathy or thryomegaly appreciated. Cor: LVAD hum present. Lungs: clear Abdomen: soft, nontender, nondistended. No hepatosplenomegaly. No bruits or masses. Good bowel sounds. Driveline: C/D/I; securement device intact and driveline incorporated no erythema.  Extremities: no cyanosis, clubbing, rash, edema Neuro: alert & orientedx3, cranial nerves grossly intact. moves all 4 extremities w/o difficulty. Affect pleasant  TELEMETRY: SR 70s    Assessment:   1) PAF - was in NSR 07/25/14. In atrial fibrillation on 09/21/14 and cardioverted back to NSR. Went back into atrial fibrillation some time after 12/23.  Now NSR on amiodarone.  2) Weakness/Fatigue 3) Chronic systolic HF - s/p LVAD 11/2200 4) Anemia: Hemoglobin with drop from 11.1=>7.4 at this admission. GI bleed Enteroscopy  Bleeding from jejunum 3 clips placed.  5) HTN 6) NSVT 7) Elevated WBC--. 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 He has noted black stool but had not been particularly worried about it because he is taking iron.He had 2 units PRBCs on 12/27. 12/29 had enteroscopy with bleeding noted in jejunum (presumed AVM) requiring 3  clips. Small ooze noted after that. Received 1U PRBC 12/29 with hgb up to 9.6.  Hemoglobin 8.1 unchanged from yesterday. Give another unit of PRBCs then possible d/c later today.    Continue to follow on heparin gtt, assess for recurrent bleeding. Coumadin restarted 12/31.  Small amount of melena noted with + FOB this am. Hgb unchanged in the last 24 hours.  We will aim for INR 1.8-2.3 at this point.     He was in atrial fibrillation at admission but has gone back to NSR on amiodarone.  Continue amiodarone 200 mg daily to complete 2 wks then decrease to 200 mg daily.   I reviewed the LVAD parameters from today, and compared the results to the patient's prior recorded data. No programming changes  were made. The LVAD is functioning within specified parameters. The patient performs LVAD self-test daily. LVAD interrogation was negative for any significant power changes, alarms or PI events/speed drops. LVAD equipment check completed and is in good working order. Back-up equipment present. LVAD education done on emergency procedures and precautions and reviewed exit site care.  CLEGG,AMY NP-C  10/03/2014 9:05 AM   Patient seen with NP, agree with the above note.  Hemoglobin stable, small melena.  I will give him 1 more unit PRBCs today for a buffer.  I will let him go home after this.  We will aim for INR 1.8-2.3 at this point.  He will need close followup, will see in 1 week with CBC.  I am also going to set him up to see hematology so he can get transfusions as outpatient if needed and also for IV iron.  Will give Fe IV rather than po so it is easier to detect melena when/if it occurs again.   Loralie Champagne 10/03/2014 10:42 AM

## 2014-10-03 NOTE — Progress Notes (Signed)
Pt passed hard formed black stool. No red or obvious blood. Sent for occult blood test with positive result.

## 2014-10-03 NOTE — Discharge Summary (Signed)
Advanced Heart Failure Team  Discharge Summary   Patient ID: [name] MRN: [id number], DOB/AGE: 1940/12/29 74 y.o. Admit date: 09/25/2014 D/C date:     10/03/2014   Primary Discharge Diagnoses:  1) Anemia-Acute blood loss.  Hemoglobin drop from 11.1=>7.4 at this admission. GI bleed Enteroscopycompleted. Bleeding from jejunum 3 clips placed. Required 4 UPRBCs 2) PAF- Went back into atrial fibrillation 09/25/14. Back in NSR prior to discharge. Remains on amiodarone.  3) Weakness/Fatigue 4) Chronic systolic HF-- s/p LVAD 02/1442 Destination Therapy  5) HTN 6) NSVT 7) Elevated WBC--. Resolved 8) Chronic Anticoagulation- no aspirin due to to GI bleed. On Coumadin chronically. 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 Quit smoking 2003. Uses CPAP and O2 every night. LVAD implant 01/11/13 under Destination Therapy Criteria. 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   Prior to admit he developed weakness and dyspnea. He was seen in the ER on 12/23 and noted to be in atrial fibrillation. His INR had been therapeutic for > 1 month, so he was cardioverted to NSR and sent home. At that time, hemoglobin was 11.1.   On December 27th he returned to the ED with progressive fatigue. He was back in A fib, hypotensive and had significant drop in hemoglobin  From 11.1 to  7.4. Coumadin was stopped and he received 2UPRBCs. GI was consulted and an enteroscopy was performed that showed bleeding from jejunum with 3 clips applied. As his INR went down heparin was started. Overall he required a total of 4 units of PRBCs. Once his hemoglobin stabilized coumadin was restarted and no further overt bleeding was noted. On the day of discharge his hemoglobin was 8.1. INR was carefully followed by pharmacy and  adjusted accordingly. He will continue on stool softners, PPI, and remain off po iron. He was referred to outpatient hematology for anemia and they will call to schedule an appointment.   He was noted to be back in A fib on admission and started on amiodarone. He later chemically converted to NSR and will continue on amiodarone. Plan to check TSH at his follow up appointment.   He will continue to be followed closely in the HF clinic and will be seen later this week. We will plan to check INR, LDH, CBC, CMET, and TSH at that time.   LVAD: flow 5.3, speed 9200 rpm, PI 4.9, power 5.7. 1 PI noted in the last 24 hours.  Discharge Weight Range: 214 pounds  Discharge Vitals: Blood pressure 105/85, pulse 57, temperature 97.6 F (36.4 C), temperature source Oral, resp. rate 18, height 6' (1.829 m), weight 214 lb 15.2 oz (97.5 kg), SpO2 96 %.  Labs: Lab Results  Component Value Date   WBC 8.2 10/03/2014   HGB 8.1* 10/03/2014   HCT 26.7* 10/03/2014   MCV 93.4 10/03/2014   PLT 180 10/03/2014    Recent Labs Lab 10/01/14 0816  10/03/14 0415  NA 135  < > 138  K 3.6  < > 3.9  CL 107  < > 108  CO2 21  < > 25  BUN 17  < > 12  CREATININE 0.83  < > 0.73  CALCIUM 8.8  < > 9.2  PROT 5.3*  --   --   BILITOT 0.6  --   --  ALKPHOS 55  --   --   ALT 24  --   --   AST 37  --   --   GLUCOSE 201*  < > 141*  < > = values in this interval not displayed. Lab Results  Component Value Date   CHOL 126 07/02/2012   HDL 40.80 07/02/2012   LDLCALC 58 07/02/2012   TRIG 134.0 07/02/2012   BNP (last 3 results)  Recent Labs  07/19/14 0830 07/23/14 1410 08/09/14 0953  PROBNP 296.4* 241.4* 478.1*    Diagnostic Studies/Procedures   No results found.  Discharge Medications     Medication List    STOP taking these medications        ferrous gluconate 324 MG tablet  Commonly known as:  FERGON      TAKE these medications        albuterol 108 (90 BASE) MCG/ACT inhaler  Commonly known as:   PROVENTIL HFA;VENTOLIN HFA  Inhale 2 puffs into the lungs every 6 (six) hours as needed for wheezing or shortness of breath.     albuterol (2.5 MG/3ML) 0.083% nebulizer solution  Commonly known as:  PROVENTIL  Take 2.5 mg by nebulization every 6 (six) hours as needed for wheezing or shortness of breath.     amiodarone 200 MG tablet  Commonly known as:  PACERONE  Take 1 tablet (200 mg total) by mouth 2 (two) times daily. Take 200 mg twice a day for 7 days then on January 11th take 200 mg daily     budesonide-formoterol 160-4.5 MCG/ACT inhaler  Commonly known as:  SYMBICORT  Inhale 2 puffs into the lungs 2 (two) times daily.     cholecalciferol 1000 UNITS tablet  Commonly known as:  VITAMIN D  Take 1,000 Units by mouth 2 (two) times daily.     citalopram 40 MG tablet  Commonly known as:  CELEXA  Take 40 mg by mouth at bedtime.     cyanocobalamin 1000 MCG tablet  Take 1,000 mcg by mouth daily.     docusate sodium 100 MG capsule  Commonly known as:  COLACE  Take 1-2 capsules (100-200 mg total) by mouth 2 (two) times daily. Take one tab in am and two tabs hs     furosemide 40 MG tablet  Commonly known as:  LASIX  Take 40 mg by mouth daily as needed (fluid retention).     levothyroxine 100 MCG tablet  Commonly known as:  SYNTHROID, LEVOTHROID  Take 100 mcg by mouth at bedtime.     losartan 50 MG tablet  Commonly known as:  COZAAR  Take 0.5 tablets (25 mg total) by mouth at bedtime.     MULTIVITAMIN PO  Take 1 tablet by mouth daily.     nitroGLYCERIN 0.4 MG SL tablet  Commonly known as:  NITROSTAT  Place 0.4 mg under the tongue every 5 (five) minutes as needed for chest pain.     pantoprazole 40 MG tablet  Commonly known as:  PROTONIX  Take 1 tablet (40 mg total) by mouth 2 (two) times daily.     potassium chloride 10 MEQ tablet  Commonly known as:  K-DUR  Take 10 mEq by mouth daily as needed (when taking Furosemide).     simvastatin 80 MG tablet  Commonly known as:   ZOCOR  Take 40 mg by mouth at bedtime.     tiotropium 18 MCG inhalation capsule  Commonly known as:  SPIRIVA  Place 18 mcg into inhaler  and inhale daily.     vitamin C 500 MG tablet  Commonly known as:  ASCORBIC ACID  Take 500 mg by mouth 2 (two) times daily.     warfarin 5 MG tablet  Commonly known as:  COUMADIN  Take 2.5 mg 1/4, 1/5 and 1/6 then repeat INR on 10/06/13        Disposition   The patient will be discharged in stable condition to home.     Discharge Instructions    Contraindication to ARB at discharge    Complete by:  As directed      Diet - low sodium heart healthy    Complete by:  As directed      Heart Failure patients record your daily weight using the same scale at the same time of day    Complete by:  As directed      Increase activity slowly    Complete by:  As directed               Duration of Discharge Encounter: Greater than 35 minutes   Signed, CLEGG,AMY NP-C  10/03/2014, 9:42 PM

## 2014-10-04 LAB — TYPE AND SCREEN
ABO/RH(D): O POS
Antibody Screen: NEGATIVE
Unit division: 0

## 2014-10-05 ENCOUNTER — Other Ambulatory Visit (HOSPITAL_COMMUNITY): Payer: Self-pay | Admitting: Infectious Diseases

## 2014-10-05 DIAGNOSIS — Z95811 Presence of heart assist device: Secondary | ICD-10-CM

## 2014-10-05 DIAGNOSIS — Z7901 Long term (current) use of anticoagulants: Secondary | ICD-10-CM

## 2014-10-06 ENCOUNTER — Ambulatory Visit (HOSPITAL_COMMUNITY)
Admission: RE | Admit: 2014-10-06 | Discharge: 2014-10-06 | Disposition: A | Discharge status: Home / Self Care | Payer: Medicare Other | Source: Ambulatory Visit | Attending: Internal Medicine | Admitting: Internal Medicine

## 2014-10-06 ENCOUNTER — Other Ambulatory Visit (HOSPITAL_COMMUNITY): Payer: Self-pay | Admitting: Infectious Diseases

## 2014-10-06 ENCOUNTER — Ambulatory Visit (HOSPITAL_COMMUNITY): Payer: Self-pay | Admitting: Infectious Diseases

## 2014-10-06 ENCOUNTER — Encounter (HOSPITAL_COMMUNITY): Payer: Self-pay

## 2014-10-06 ENCOUNTER — Telehealth (HOSPITAL_COMMUNITY): Payer: Self-pay | Admitting: Infectious Diseases

## 2014-10-06 VITALS — BP 88/0 | HR 66 | Wt 224.6 lb

## 2014-10-06 DIAGNOSIS — F329 Major depressive disorder, single episode, unspecified: Secondary | ICD-10-CM | POA: Insufficient documentation

## 2014-10-06 DIAGNOSIS — I4891 Unspecified atrial fibrillation: Secondary | ICD-10-CM

## 2014-10-06 DIAGNOSIS — Z7901 Long term (current) use of anticoagulants: Secondary | ICD-10-CM | POA: Diagnosis not present

## 2014-10-06 DIAGNOSIS — G4733 Obstructive sleep apnea (adult) (pediatric): Secondary | ICD-10-CM | POA: Diagnosis not present

## 2014-10-06 DIAGNOSIS — Z95811 Presence of heart assist device: Secondary | ICD-10-CM

## 2014-10-06 DIAGNOSIS — I480 Paroxysmal atrial fibrillation: Secondary | ICD-10-CM | POA: Insufficient documentation

## 2014-10-06 DIAGNOSIS — D500 Iron deficiency anemia secondary to blood loss (chronic): Secondary | ICD-10-CM | POA: Diagnosis not present

## 2014-10-06 DIAGNOSIS — I5022 Chronic systolic (congestive) heart failure: Secondary | ICD-10-CM | POA: Insufficient documentation

## 2014-10-06 LAB — CBC
HCT: 34.1 % — ABNORMAL LOW (ref 39.0–52.0)
Hemoglobin: 10.1 g/dL — ABNORMAL LOW (ref 13.0–17.0)
MCH: 26.9 pg (ref 26.0–34.0)
MCHC: 29.6 g/dL — ABNORMAL LOW (ref 30.0–36.0)
MCV: 90.9 fL (ref 78.0–100.0)
PLATELETS: 203 10*3/uL (ref 150–400)
RBC: 3.75 MIL/uL — ABNORMAL LOW (ref 4.22–5.81)
RDW: 18.5 % — ABNORMAL HIGH (ref 11.5–15.5)
WBC: 8.4 10*3/uL (ref 4.0–10.5)

## 2014-10-06 LAB — PROTIME-INR
INR: 1.82 — ABNORMAL HIGH (ref 0.00–1.49)
PROTHROMBIN TIME: 21.2 s — AB (ref 11.6–15.2)

## 2014-10-06 LAB — COMPREHENSIVE METABOLIC PANEL
ALT: 29 U/L (ref 0–53)
ANION GAP: 7 (ref 5–15)
AST: 36 U/L (ref 0–37)
Albumin: 3.4 g/dL — ABNORMAL LOW (ref 3.5–5.2)
Alkaline Phosphatase: 54 U/L (ref 39–117)
BILIRUBIN TOTAL: 0.7 mg/dL (ref 0.3–1.2)
BUN: 16 mg/dL (ref 6–23)
CHLORIDE: 106 meq/L (ref 96–112)
CO2: 24 mmol/L (ref 19–32)
CREATININE: 1.06 mg/dL (ref 0.50–1.35)
Calcium: 9.5 mg/dL (ref 8.4–10.5)
GFR, EST AFRICAN AMERICAN: 79 mL/min — AB (ref >90)
GFR, EST NON AFRICAN AMERICAN: 68 mL/min — AB (ref >90)
Glucose, Bld: 134 mg/dL — ABNORMAL HIGH (ref 70–99)
Potassium: 3.9 mmol/L (ref 3.5–5.1)
Sodium: 137 mmol/L (ref 135–145)
Total Protein: 6.3 g/dL (ref 6.0–8.3)

## 2014-10-06 LAB — LACTATE DEHYDROGENASE: LDH: 295 U/L — ABNORMAL HIGH (ref 94–250)

## 2014-10-06 LAB — T4, FREE: Free T4: 1.07 ng/dL (ref 0.80–1.80)

## 2014-10-06 LAB — TSH: TSH: 6.924 u[IU]/mL — AB (ref 0.350–4.500)

## 2014-10-06 NOTE — Telephone Encounter (Signed)
Called patient's wife to inform her of scheduled TEE and DC-CV (Case # [id number]) for 10/12/14 at 9:00. CBC, INR, BMET to be drawn Monday 10/10/14 at [facility] lab prior to procedure. Instructed to register with admissions 90 min prior to the procedure start time. Changed f/u clinic appointment to 2 weeks instead of 4 per [name] NP notes. Mr. Ringler has been scheduled for 10/20/14 @ 12:00.

## 2014-10-06 NOTE — Progress Notes (Signed)
  Symptom Yes No Details  Angina  x Activity:  Claudication  x How far:  Syncope  x When:  Stroke  x   Orthopnea  x How many pillows: 2 for comfort  PND  x How often:  CPAP x  How many hrs: every night with oxygen   Pedal edema  x   Abd fullness  x   N&V  x   Diaphoresis  x When:  Bleeding  x No indications of bleeding; stool grey to brown color since off iron.   Urine  x Clear yellow   SOB x  Activity: shortness of breath noted with quick position changes and walking distances. 3 lb increase in weight in last 24 hrs.   Palpitations  x When:  ICD shock  x   Hospitlizaitons x  When/where/why:[facility] 12/27-10/03/14 for GI bleeding and new onset atrial fib. Colonoscopy showing AVM-->3 clips. Hgb 11.4-->7.4-->8.1 (D/C). Received 4 u PRBC.   ED visit x  When/where/why: 09/21/14 PAF-->DC-CV  Other MD  x When/who/why:  Activity x    Fluid  x   Diet  x     Pulse: 88 Weight: 224.6 (clinic with clothes/eqt) Hospital D/C weight 214 lbs 10/03/14 BP: 88 dopplar   SPO2: 98 % Last weight: Hospital D/C weight 214 lbs 10/03/14  VAD interrogation revealed:  Speed:  9200 Flow:  5.4 Power:  5.2 PI: 5.7 Alarms: none   Events: rare PI events since D/C  Fixed speed: 9200 Low speed limit: 8600 Primary Controller:  Replace back up battery in  __18___  Months. Back up controller:   Replace back up battery in  __18___  Months.  I reviewed the LVAD parameters from today, and compared the results to the patient's prior recorded data.  No programming changes were made. The LVAD is functioning within specified parameters.  The patient performs LVAD self-test daily.  LVAD interrogation was negative for any significant power changes, alarms or PI events/speed drops.  LVAD equipment check completed and is in good working order.  Back-up equipment present.   Exit site healed with velour fully implanted at exit site. Pt denies fever or chills. Driveline dressing is being changed weekly per sterile technique with  Sorbaview dressing. No BioPatch due to irritation to skin. Provided 8 weekly dressing kits. Pt/caregiver deny any alarms or VAD equipment issue.   VAD coordinator reviewed daily log from home for daily temperature, weight, and VAD parameters. Pt is performing daily controller and system monitor self tests along with completing weekly and monthly maintenance for LVAD equipment.

## 2014-10-06 NOTE — Patient Instructions (Signed)
1. Continue Amiodarone 200 mg twice a day (DO NOT decrease the dose like originally planned)  2. Take fluid pill when weight is 220 lb (not 225 lbs) or if short of breath.  3. Take Fluid pill today Thursday 10/06/14.  4. Increase INR goal for 2.0 so we can plan for a cardioversion next week. We will call you with an appoint.  5. Take a whole tablet (5 mg) Coumadin daily for now.  6. Recheck INR Monday 10/10/14

## 2014-10-06 NOTE — Progress Notes (Signed)
Patient ID: [name], male   DOB: 01-28-1941, 74 y.o.   MRN: [id number]   [facility] [place] (Dr. 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 Quit smoking 2003. Uses CPAP and O2 every night. He is s/p LVAD HM II implanted 01/12/13 under DT criteria  Admitted to [facility] 09/13/13 - 09/14/13 for increased fatigue and dyspnea. Found to have mild CHF and anemia. Diuresed with IV lasix and transitioned to PO lasix 40 mg twice a week. Discharge weight 203 lbs on inpatient scale.  Admitting labs revealed Hgb 7.7 for which he received 2 units PCs with appropriate rise in Hgb.   Admit 6/8-6/12 for symptomatic anemia. Had EGD/Colonoscopy which was unrevealing.  6 mm polyp clipped and removed. CT abdomen showed cirrhosis. Placed on heparin bridge. Capsule study pending. Sent home on lovenox. D/C weight 206 lbs.   Admitted to [facility] with wit GI bleed. GI consulted. Push enteroscopy 10/28 negative. Colonoscopy 10/29 without source of bleeding.Result of capsule endoscopy--> Showed bleeding from duodenum. EGD--->Bleeding from duodenum with clip applied. Overall he received  10 UPRBCs. Discharge weight was 207 pounds.   Admitted to [facility] 09/21/14 with GI bleed . GI consulted. Enteroscopy showed bleed from jejunum requiring 3 clips. Overall he received 4 units PRBCs.  He went back in A fib and had DC-CV and briefly maintained NSR however on the day discharge he was back in NSR on amiodarone 200 mg twice a day. Discharge weight was 214 pounds.   He returns for follow up. Overall he feeling fair. Mild dyspnea with exertion. Says he feels ok after he starts walking. Denies PND/Orthopnea/dizziness.  Has not required any lasix.  Weight at home 214-217 pounds. Denies BRBPR. Taking all medications. Not using CPAP.  He continues weekly  dressing changes.    Denies LVAD alarms. Denies driveline trauma, erythema or drainage.  Denies ICD shocks. Reports taking Coumadin as prescribed and adherence to anticoagulation based dietary restrictions. Denies bright red blood per rectum or melena, no dark urine or hematuria.   Past Medical History  Diagnosis Date  . Ischemic cardiomyopathy      CABG 2003, PCI 2007  EF 27%(myoview 2012  . Chronic systolic heart failure   . Hyperlipidemia   . Hypothyroidism   . Chronic anticoagulation     Afib and LVAD  . Obesity   . COPD (chronic obstructive pulmonary disease)   . Asbestosis(501)     "6 years in the [place]" (05/24/2013)  . Atrial fibrillation     permanent  . Paroxysmal ventricular tachycardia   . Coronary artery disease   . Implantable cardioverter-defibrillator [employer]   . Hypertension   . Myocardial infarction 1990's-2000    "2 in  ~ the 1990's; 1 in ~ 2000" (05/24/2013)  . OSA on CPAP   . Depression   . LVAD (left ventricular assist device) present 12/2012    Current Outpatient Prescriptions  Medication Sig Dispense Refill  . albuterol (PROVENTIL HFA;VENTOLIN HFA) 108 (90 BASE) MCG/ACT inhaler Inhale 2 puffs into the lungs every 6 (six) hours as needed for wheezing or shortness of breath.    [name] albuterol (PROVENTIL) (2.5 MG/3ML) 0.083% nebulizer solution Take 2.5 mg by nebulization every 6 (six) hours as needed for wheezing or shortness of breath.     [name] amiodarone (PACERONE) 200 MG tablet Take  1 tablet (200 mg total) by mouth 2 (two) times daily. Take 200 mg twice a day for 7 days then on January 11th take 200 mg daily (Patient taking differently: Take 200 mg by mouth 2 (two) times daily. Take 200 mg twice a day) 37 tablet 6  . budesonide-formoterol (SYMBICORT) 160-4.5 MCG/ACT inhaler Inhale 2 puffs into the lungs 2 (two) times daily.    . cholecalciferol (VITAMIN D) 1000 UNITS tablet Take 1,000 Units by mouth 2 (two) times daily.     . citalopram (CELEXA) 40 MG tablet Take  40 mg by mouth at bedtime.    . cyanocobalamin 1000 MCG tablet Take 1,000 mcg by mouth daily.     [name] docusate sodium (COLACE) 100 MG capsule Take 1-2 capsules (100-200 mg total) by mouth 2 (two) times daily. Take one tab in am and two tabs hs 10 capsule 0  . furosemide (LASIX) 40 MG tablet Take 40 mg by mouth daily as needed (fluid retention).    [name] levothyroxine (SYNTHROID, LEVOTHROID) 100 MCG tablet Take 100 mcg by mouth at bedtime.     [name] losartan (COZAAR) 50 MG tablet Take 0.5 tablets (25 mg total) by mouth at bedtime. 90 tablet 3  . Multiple Vitamins-Minerals (MULTIVITAMIN PO) Take 1 tablet by mouth daily.    . nitroGLYCERIN (NITROSTAT) 0.4 MG SL tablet Place 0.4 mg under the tongue every 5 (five) minutes as needed for chest pain.     . pantoprazole (PROTONIX) 40 MG tablet Take 1 tablet (40 mg total) by mouth 2 (two) times daily. 60 tablet 6  . potassium chloride (K-DUR) 10 MEQ tablet Take 10 mEq by mouth daily as needed (when taking Furosemide).    . simvastatin (ZOCOR) 80 MG tablet Take 40 mg by mouth at bedtime.      [name] tiotropium (SPIRIVA) 18 MCG inhalation capsule Place 18 mcg into inhaler and inhale daily.     . vitamin C (ASCORBIC ACID) 500 MG tablet Take 500 mg by mouth 2 (two) times daily.     [name] warfarin (COUMADIN) 5 MG tablet Take 2.5 mg 1/4, 1/5 and 1/6 then repeat INR on 10/06/13 180 tablet 3   No current facility-administered medications for this encounter.    Review of patient's allergies indicates no known allergies.  REVIEW OF SYSTEMS: All systems negative except as listed in HPI, PMH and Problem list.   LVAD interrogation reveals:  Speed: 9200 Flow:  5.0 Power:  5.5 PI:  4.0  Alarms:  No alarms Events:  Rare PI event Fixed speed:  9200 Low speed limit: 8600  I reviewed the LVAD parameters from today, and compared the results to the patient's prior recorded data.  No programming changes were made.  The LVAD is functioning within specified parameters.  The patient  performs LVAD self-test daily.  LVAD interrogation was negative for any significant power changes or alarms. There were a few PI events daily, but nothing concerning and none back to back. LVAD equipment check completed and is in good working order.  Back-up equipment present.   LVAD education done on emergency procedures and precautions and reviewed exit site care.  Filed Vitals:   10/06/14 1246  Pulse: 66  Weight: 224 lb 9.6 oz (101.878 kg)  SpO2: 98%    Physical Exam: MAP 88 GENERAL: Well appearing, male; NAD; wife present HEENT: normal  NECK: Supple, JVP to ~10. 2+ bilaterally, no bruits.  No lymphadenopathy or thyromegaly appreciated.   CARDIAC: Irregular Mechanical heart sounds with  LVAD hum present.  LUNGS:  Clear to auscultation bilaterally.  ABDOMEN:  Obese, Soft, round, nontender, positive bowel sounds x4.     LVAD exit site: well-healed and incorporated.  Dressing dry and intact.  No erythema, drainage, odor or tenderness.  Stabilization device present and accurately applied.  Driveline dressing is being changed daily per sterile technique. Changed in clinic EXTREMITIES:  Warm and dry, no cyanosis, clubbing, no edema NEUROLOGIC:  Alert and oriented x 4.  Gait steady.  No aphasia.  No dysarthria.  Affect pleasant.    EKG: A fib 68 bpm     ASSESSMENT AND PLAN:   1) Chronic Systolic HF: s/p LVAD implant 12/2012 - NYHA II-III symptoms and volume status elevated but A fib is likely contributing. . Give lasix 40 mg daily today. Adjust sliding scale diuretic regimen to take lasix 40 mg for weight 220 pounds with 40 meq of potassium -  Not on BB due to bradycardia. Continue losartan 25 mg daily.  MAP stable.  - Reinforced the need and importance of daily weights, a low sodium diet, and fluid restriction (less than 2 L a day). Instructed to call the HF clinic if weight increases more than 3 lbs overnight or 5 lbs in a week.  2)  Anticoagulation management  -  No ASA with recent  bleeding. INR goal 1.8-2.5 .  INR 1.8  Discussed with HF pharmacy and they will adjust accordingly.  3) Anemia/ GIB  - Discharged earlier this week GI bleed. Had bleed noted in jejunum requiring 3 clips. Hgb stable.  Not on Iron with recent GI bleed. Continue PPI and stool softner. Trying to set up with hematology for anemia.  4 LVAD - No issues. All parameters within normal limits.   - Check CBC, BMET, LDH and INR- Lab work stable. Coumadin adjusted. Per pharmacy 5. PAF -Had DC-CV 12/23 briefly back in NSR but on discharge 1/4 he chemically converted with amio 200 mg twice a day.  Back in A fib today with increased dyspne. Continue amiodarone 200 mg twice a day. INR 1.8 on Coumadin. Will need TEE/DC-CV next week. HF Pharmacy to address coumadin.    6. Depression - Stable.  Continue Celexa 40 mg daily 7. OSA- not using CPAP.   Follow up next week for TEE and no clot --> DC-CV.  Follow up 2 weeks in clinic. [name]    CLEGG,AMY NP-C  12:27 PM  Patient seen and examined with [name], NP. We discussed all aspects of the encounter. I agree with the assessment and plan as stated above.   Hgb stable. Has mild volume overload. Will have him take lasix. Back in AF. Will continue amio 200 bid. Plan TEE and DC-CV next week (INR 1.8 today). VAD parameters stable.   Benay Spice 4:37 PM

## 2014-10-07 NOTE — Addendum Note (Signed)
Encounter addended by: Georga Kaufmann, CCT on: 10/07/2014  1:27 PM<BR>     Documentation filed: Charges VN

## 2014-10-09 ENCOUNTER — Telehealth (HOSPITAL_COMMUNITY): Payer: Self-pay | Admitting: *Deleted

## 2014-10-10 ENCOUNTER — Telehealth (HOSPITAL_COMMUNITY): Payer: Self-pay | Admitting: Infectious Diseases

## 2014-10-10 ENCOUNTER — Ambulatory Visit (HOSPITAL_COMMUNITY): Payer: Self-pay | Admitting: Infectious Diseases

## 2014-10-10 ENCOUNTER — Other Ambulatory Visit (INDEPENDENT_AMBULATORY_CARE_PROVIDER_SITE_OTHER): Payer: Non-veteran care | Admitting: *Deleted

## 2014-10-10 DIAGNOSIS — Z95811 Presence of heart assist device: Secondary | ICD-10-CM | POA: Diagnosis not present

## 2014-10-10 DIAGNOSIS — I480 Paroxysmal atrial fibrillation: Secondary | ICD-10-CM | POA: Diagnosis not present

## 2014-10-10 DIAGNOSIS — Z7901 Long term (current) use of anticoagulants: Secondary | ICD-10-CM | POA: Diagnosis not present

## 2014-10-10 LAB — CBC WITH DIFFERENTIAL/PLATELET
BASOS ABS: 0 10*3/uL (ref 0.0–0.1)
Basophils Relative: 0.2 % (ref 0.0–3.0)
Eosinophils Absolute: 0.2 10*3/uL (ref 0.0–0.7)
Eosinophils Relative: 2.6 % (ref 0.0–5.0)
HEMATOCRIT: 32.6 % — AB (ref 39.0–52.0)
Hemoglobin: 10 g/dL — ABNORMAL LOW (ref 13.0–17.0)
LYMPHS PCT: 8.3 % — AB (ref 12.0–46.0)
Lymphs Abs: 0.7 10*3/uL (ref 0.7–4.0)
MCHC: 30.7 g/dL (ref 30.0–36.0)
MCV: 86.7 fl (ref 78.0–100.0)
MONOS PCT: 5.6 % (ref 3.0–12.0)
Monocytes Absolute: 0.5 10*3/uL (ref 0.1–1.0)
NEUTROS PCT: 83.3 % — AB (ref 43.0–77.0)
Neutro Abs: 7.4 10*3/uL (ref 1.4–7.7)
PLATELETS: 226 10*3/uL (ref 150.0–400.0)
RBC: 3.77 Mil/uL — ABNORMAL LOW (ref 4.22–5.81)
RDW: 20.4 % — AB (ref 11.5–15.5)
WBC: 8.9 10*3/uL (ref 4.0–10.5)

## 2014-10-10 LAB — BASIC METABOLIC PANEL
BUN: 19 mg/dL (ref 6–23)
CO2: 26 mEq/L (ref 19–32)
Calcium: 9.9 mg/dL (ref 8.4–10.5)
Chloride: 103 mEq/L (ref 96–112)
Creatinine, Ser: 0.9 mg/dL (ref 0.4–1.5)
GFR: 84.67 mL/min (ref >60.00)
Glucose, Bld: 91 mg/dL (ref 70–99)
Potassium: 4.3 mEq/L (ref 3.5–5.1)
Sodium: 135 mEq/L (ref 135–145)

## 2014-10-10 LAB — PROTIME-INR
INR: 1.7 ratio — AB (ref 0.8–1.0)
Prothrombin Time: 18.3 s — ABNORMAL HIGH (ref 9.6–13.1)

## 2014-10-10 NOTE — Telephone Encounter (Signed)
Discussed INR results with [name] and need to rescheduleTEE/DCCV from Wednesday 10/12/14 to Friday 10/14/14. Instructions provided per anticoagulation management (see anticoag note 10/10/2014 for details), NPO status with meds/sips only on 10/14/14 and need to report to admissions 90 minutes prior to procedure for check in and lab work. Nosebleeds have stopped for now per Fae and instructions to page VAD pager if nosebleeds start back up. Stressed importance of maintaining the coumadin dose to have procedure completed, verbalized understanding back. She also verbalized need for warfarin prescription attention by provider since [place] changed the manufacturer. Fax sent back to patient's pharmacy for provider approval re: change in warfarin distributor so they will be able to refill prescription.

## 2014-10-10 NOTE — Telephone Encounter (Signed)
Pt's wife called to report patient having nosebleed. He woke with drainage down back of his throat and noted bloody drainage.  Instructed to apply pressure, ice pack, and pack if continues to bleed. If bleeding does not stop, instructed to call VAD pager and will ask him to come to ED.  Dr. Haroldine Laws updated - agrees with plan.

## 2014-10-10 NOTE — Addendum Note (Signed)
Addended by: Eulis Foster on: 10/10/2014 08:56 AM   Modules accepted: Orders

## 2014-10-11 ENCOUNTER — Encounter (HOSPITAL_COMMUNITY): Payer: Self-pay | Admitting: Infectious Diseases

## 2014-10-11 NOTE — Progress Notes (Signed)
Telephone call placed to Mr. Fickling's pharmacy [id number] in [place] for coumadin dispensing. Verbal authorization provided on behalf of [name], NP-C. Fax sent yesterday 10/10/14, however was not received.   [facility] [place], [place] - [address] [phone] (Phone)

## 2014-10-13 NOTE — Anesthesia Preprocedure Evaluation (Signed)
Anesthesia Evaluation  Patient identified by MRN, date of birth, ID band Patient awake    Reviewed: Allergy & Precautions, NPO status , Patient's Chart, lab work & pertinent test results, reviewed documented beta blocker date and time   Airway        Dental   Pulmonary sleep apnea and Continuous Positive Airway Pressure Ventilation , COPDformer smoker,          Cardiovascular hypertension, Pt. on medications + CAD and + Past MI + dysrhythmias Atrial Fibrillation + Cardiac Defibrillator  LVAD placed 2014, ICD, SP CABG 2003   Neuro/Psych Depression TIA   GI/Hepatic PUD,   Endo/Other    Renal/GU      Musculoskeletal   Abdominal   Peds  Hematology 10/32   Anesthesia Other Findings   Reproductive/Obstetrics                             Anesthesia Physical Anesthesia Plan  ASA: IV  Anesthesia Plan: MAC   Post-op Pain Management:    Induction: Intravenous  Airway Management Planned: Nasal Cannula  Additional Equipment:   Intra-op Plan:   Post-operative Plan:   Informed Consent: I have reviewed the patients History and Physical, chart, labs and discussed the procedure including the risks, benefits and alternatives for the proposed anesthesia with the patient or authorized representative who has indicated his/her understanding and acceptance.     Plan Discussed with:   Anesthesia Plan Comments: (Doppler BP, albumin for volume, neo available, light anesthesia, lidocaine gargle prior to procedure)        Anesthesia Quick Evaluation

## 2014-10-14 ENCOUNTER — Other Ambulatory Visit (HOSPITAL_COMMUNITY): Payer: Self-pay | Admitting: Infectious Diseases

## 2014-10-14 ENCOUNTER — Ambulatory Visit (HOSPITAL_COMMUNITY)
Admission: RE | Admit: 2014-10-14 | Discharge: 2014-10-14 | Disposition: A | Discharge status: Home / Self Care | Payer: Medicare Other | Source: Ambulatory Visit | Attending: Internal Medicine | Admitting: Internal Medicine

## 2014-10-14 ENCOUNTER — Telehealth (HOSPITAL_COMMUNITY): Payer: Self-pay | Admitting: Infectious Diseases

## 2014-10-14 ENCOUNTER — Ambulatory Visit (HOSPITAL_COMMUNITY): Payer: Medicare Other | Admitting: Critical Care Medicine

## 2014-10-14 ENCOUNTER — Ambulatory Visit (HOSPITAL_COMMUNITY): Payer: Self-pay | Admitting: Infectious Diseases

## 2014-10-14 ENCOUNTER — Encounter (HOSPITAL_COMMUNITY)
Admission: RE | Disposition: A | Discharge status: Home / Self Care | Payer: Self-pay | Source: Ambulatory Visit | Attending: Internal Medicine

## 2014-10-14 ENCOUNTER — Other Ambulatory Visit (INDEPENDENT_AMBULATORY_CARE_PROVIDER_SITE_OTHER): Payer: Medicare Other | Admitting: *Deleted

## 2014-10-14 ENCOUNTER — Encounter (HOSPITAL_COMMUNITY)
Admission: RE | Disposition: A | Discharge status: Home / Self Care | Payer: Medicare Other | Source: Ambulatory Visit | Attending: Internal Medicine

## 2014-10-14 DIAGNOSIS — Z95811 Presence of heart assist device: Secondary | ICD-10-CM

## 2014-10-14 DIAGNOSIS — I4891 Unspecified atrial fibrillation: Secondary | ICD-10-CM | POA: Diagnosis present

## 2014-10-14 DIAGNOSIS — Z538 Procedure and treatment not carried out for other reasons: Secondary | ICD-10-CM | POA: Insufficient documentation

## 2014-10-14 DIAGNOSIS — Z7901 Long term (current) use of anticoagulants: Secondary | ICD-10-CM

## 2014-10-14 LAB — PROTIME-INR
INR: 2 ratio — AB (ref 0.8–1.0)
Prothrombin Time: 22.3 s — ABNORMAL HIGH (ref 9.6–13.1)

## 2014-10-14 SURGERY — CANCELLED PROCEDURE
Anesthesia: Monitor Anesthesia Care

## 2014-10-14 SURGERY — CARDIOVERSION
Anesthesia: Monitor Anesthesia Care

## 2014-10-14 MED ORDER — SODIUM CHLORIDE 0.9 % IV SOLN: INTRAVENOUS | Status: DC

## 2014-10-14 NOTE — Telephone Encounter (Signed)
Called [name] re: cancelled TEE/DC-CV this morning d/t patient being in NSR. Verified that the hospital did not draw lab work. Discussed with [name] Pharm-D re: warfarin dosing for INR management and requested patient please come to [facility] lab today for repeat INR due to recent addition of Amiodarone, lower and narrower INR goal and his fragile history of GI bleeding. Verbalized to me that it would not be a problem and they would go over to have it done today. Requested to have warfarin prescription changed to 90 day supply as well. Will make the changes and call the pharmacy to assist with medication maintenance.

## 2014-10-14 NOTE — Telephone Encounter (Signed)
A user error has taken place: orders placed in error, not carried out on this patient.

## 2014-10-17 ENCOUNTER — Other Ambulatory Visit (HOSPITAL_COMMUNITY): Payer: Self-pay | Admitting: Infectious Diseases

## 2014-10-17 DIAGNOSIS — Z7901 Long term (current) use of anticoagulants: Secondary | ICD-10-CM

## 2014-10-17 DIAGNOSIS — Z95811 Presence of heart assist device: Secondary | ICD-10-CM

## 2014-10-17 DIAGNOSIS — I480 Paroxysmal atrial fibrillation: Secondary | ICD-10-CM

## 2014-10-18 ENCOUNTER — Other Ambulatory Visit (HOSPITAL_COMMUNITY): Payer: Self-pay | Admitting: *Deleted

## 2014-10-18 DIAGNOSIS — K922 Gastrointestinal hemorrhage, unspecified: Secondary | ICD-10-CM

## 2014-10-18 DIAGNOSIS — D62 Acute posthemorrhagic anemia: Secondary | ICD-10-CM

## 2014-10-18 DIAGNOSIS — Z95811 Presence of heart assist device: Secondary | ICD-10-CM

## 2014-10-18 DIAGNOSIS — Z7901 Long term (current) use of anticoagulants: Secondary | ICD-10-CM

## 2014-10-19 ENCOUNTER — Other Ambulatory Visit (HOSPITAL_COMMUNITY): Payer: Self-pay | Admitting: *Deleted

## 2014-10-19 MED ORDER — AMIODARONE HCL 200 MG PO TABS: 200.0000 mg | ORAL_TABLET | Freq: Two times a day (BID) | ORAL | Status: DC

## 2014-10-20 ENCOUNTER — Telehealth: Payer: Self-pay | Admitting: Hematology

## 2014-10-20 ENCOUNTER — Ambulatory Visit (HOSPITAL_COMMUNITY): Payer: Self-pay | Admitting: *Deleted

## 2014-10-20 ENCOUNTER — Ambulatory Visit (HOSPITAL_COMMUNITY)
Admission: RE | Admit: 2014-10-20 | Discharge: 2014-10-20 | Disposition: A | Discharge status: Home / Self Care | Payer: Medicare Other | Source: Ambulatory Visit | Attending: Internal Medicine | Admitting: Internal Medicine

## 2014-10-20 ENCOUNTER — Other Ambulatory Visit (HOSPITAL_COMMUNITY): Payer: Self-pay | Admitting: *Deleted

## 2014-10-20 VITALS — BP 92/0 | HR 71 | Ht 73.0 in | Wt 225.6 lb

## 2014-10-20 DIAGNOSIS — I480 Paroxysmal atrial fibrillation: Secondary | ICD-10-CM

## 2014-10-20 DIAGNOSIS — R0600 Dyspnea, unspecified: Secondary | ICD-10-CM

## 2014-10-20 DIAGNOSIS — Z7901 Long term (current) use of anticoagulants: Secondary | ICD-10-CM | POA: Diagnosis not present

## 2014-10-20 DIAGNOSIS — I5022 Chronic systolic (congestive) heart failure: Secondary | ICD-10-CM

## 2014-10-20 DIAGNOSIS — Z95811 Presence of heart assist device: Secondary | ICD-10-CM | POA: Insufficient documentation

## 2014-10-20 DIAGNOSIS — I4891 Unspecified atrial fibrillation: Secondary | ICD-10-CM

## 2014-10-20 DIAGNOSIS — T829XXD Unspecified complication of cardiac and vascular prosthetic device, implant and graft, subsequent encounter: Secondary | ICD-10-CM

## 2014-10-20 LAB — CBC
HEMATOCRIT: 34.1 % — AB (ref 39.0–52.0)
HEMOGLOBIN: 10 g/dL — AB (ref 13.0–17.0)
MCH: 25.3 pg — ABNORMAL LOW (ref 26.0–34.0)
MCHC: 29.3 g/dL — ABNORMAL LOW (ref 30.0–36.0)
MCV: 86.1 fL (ref 78.0–100.0)
PLATELETS: 203 10*3/uL (ref 150–400)
RBC: 3.96 MIL/uL — ABNORMAL LOW (ref 4.22–5.81)
RDW: 18.3 % — AB (ref 11.5–15.5)
WBC: 7.1 10*3/uL (ref 4.0–10.5)

## 2014-10-20 LAB — BASIC METABOLIC PANEL
ANION GAP: 9 (ref 5–15)
BUN: 10 mg/dL (ref 6–23)
CO2: 24 mmol/L (ref 19–32)
Calcium: 9.6 mg/dL (ref 8.4–10.5)
Chloride: 107 mEq/L (ref 96–112)
Creatinine, Ser: 0.99 mg/dL (ref 0.50–1.35)
GFR calc Af Amer: >90 mL/min (ref >90)
GFR calc non Af Amer: 80 mL/min — ABNORMAL LOW (ref >90)
GLUCOSE: 103 mg/dL — AB (ref 70–99)
POTASSIUM: 3.9 mmol/L (ref 3.5–5.1)
SODIUM: 140 mmol/L (ref 135–145)

## 2014-10-20 LAB — PROTIME-INR
INR: 2.82 — ABNORMAL HIGH (ref 0.00–1.49)
Prothrombin Time: 29.9 seconds — ABNORMAL HIGH (ref 11.6–15.2)

## 2014-10-20 LAB — LACTATE DEHYDROGENASE: LDH: 322 U/L — ABNORMAL HIGH (ref 94–250)

## 2014-10-20 NOTE — Telephone Encounter (Signed)
S/W PATIENT WIFE AND GAVE NP APPT FOR 02/02 @ 2:30 W/DR. [place] Glori Bickers

## 2014-10-20 NOTE — Progress Notes (Signed)
Patient ID: [name], male   DOB: Jan 15, 1941, 74 y.o.   MRN: [id number]  [facility] [place] (Dr. 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 Quit smoking 2003. Uses CPAP and O2 every night. He is s/p LVAD HM II implanted 01/12/13 under DT criteria  Admitted to [facility] 09/13/13 - 09/14/13 for increased fatigue and dyspnea. Found to have mild CHF and anemia. Diuresed with IV lasix and transitioned to PO lasix 40 mg twice a week. Discharge weight 203 lbs on inpatient scale.  Admitting labs revealed Hgb 7.7 for which he received 2 units PCs with appropriate rise in Hgb.   Admit 6/8-6/12 for symptomatic anemia. Had EGD/Colonoscopy which was unrevealing.  6 mm polyp clipped and removed. CT abdomen showed cirrhosis. Placed on heparin bridge. Capsule study pending. Sent home on lovenox. D/C weight 206 lbs.   Admitted to [facility] with wit GI bleed. GI consulted. Push enteroscopy 10/28 negative. Colonoscopy 10/29 without source of bleeding.Result of capsule endoscopy--> Showed bleeding from duodenum. EGD--->Bleeding from duodenum with clip applied. Overall he received  10 UPRBCs. Discharge weight was 207 pounds.   Admitted to [facility] 09/21/14 with GI bleed . GI consulted. Enteroscopy showed bleed from jejunum requiring 3 clips. Overall he received 4 units PRBCs.  He went back in A fib and had DC-CV and briefly maintained NSR however on the day discharge he was back in NSR on amiodarone 200 mg twice a day. Discharge weight was 214 pounds.   He returns for follow up. Overall he feeling fair. Mild dyspnea with exertion. Says he feels ok after he starts walking. Denies PND/Orthopnea/dizziness.  Has not required any lasix.  Weight at home 214-217 pounds. Denies BRBPR. Taking all medications. Not using CPAP.  He continues weekly  dressing changes.    Denies LVAD alarms. Denies driveline trauma, erythema or drainage.  Denies ICD shocks. Reports taking Coumadin as prescribed and adherence to anticoagulation based dietary restrictions. Denies bright red blood per rectum or melena, no dark urine or hematuria.   Past Medical History  Diagnosis Date  . Ischemic cardiomyopathy      CABG 2003, PCI 2007  EF 27%(myoview 2012  . Chronic systolic heart failure   . Hyperlipidemia   . Hypothyroidism   . Chronic anticoagulation     Afib and LVAD  . Obesity   . COPD (chronic obstructive pulmonary disease)   . Asbestosis(501)     "6 years in the [place]" (05/24/2013)  . Atrial fibrillation     permanent  . Paroxysmal ventricular tachycardia   . Coronary artery disease   . Implantable cardioverter-defibrillator [employer]onic   . Hypertension   . Myocardial infarction 1990's-2000    "2 in  ~ the 1990's; 1 in ~ 2000" (05/24/2013)  . OSA on CPAP   . Depression   . LVAD (left ventricular assist device) present 12/2012    Current Outpatient Prescriptions  Medication Sig Dispense Refill  . albuterol (PROVENTIL HFA;VENTOLIN HFA) 108 (90 BASE) MCG/ACT inhaler Inhale 2 puffs into the lungs every 6 (six) hours as needed for wheezing or shortness of breath.    [name] albuterol (PROVENTIL) (2.5 MG/3ML) 0.083% nebulizer solution Take 2.5 mg by nebulization every 6 (six) hours as needed for wheezing or shortness of breath.     [name] amiodarone (PACERONE) 200 MG tablet Take 1  tablet (200 mg total) by mouth 2 (two) times daily. 60 tablet 6  . budesonide-formoterol (SYMBICORT) 160-4.5 MCG/ACT inhaler Inhale 2 puffs into the lungs 2 (two) times daily.    . cholecalciferol (VITAMIN D) 1000 UNITS tablet Take 1,000 Units by mouth 2 (two) times daily.     . citalopram (CELEXA) 40 MG tablet Take 40 mg by mouth at bedtime.    . cyanocobalamin 1000 MCG tablet Take 1,000 mcg by mouth daily.     [name] docusate sodium (COLACE) 100 MG capsule Take 1-2 capsules (100-200 mg  total) by mouth 2 (two) times daily. Take one tab in am and two tabs hs 10 capsule 0  . furosemide (LASIX) 40 MG tablet Take 40 mg by mouth daily as needed (fluid retention).    [name] levothyroxine (SYNTHROID, LEVOTHROID) 100 MCG tablet Take 100 mcg by mouth at bedtime.     [name] losartan (COZAAR) 50 MG tablet Take 0.5 tablets (25 mg total) by mouth at bedtime. (Patient taking differently: Take 50 mg by mouth daily. ) 90 tablet 3  . Multiple Vitamins-Minerals (MULTIVITAMIN PO) Take 1 tablet by mouth daily.    . nitroGLYCERIN (NITROSTAT) 0.4 MG SL tablet Place 0.4 mg under the tongue every 5 (five) minutes as needed for chest pain.     . pantoprazole (PROTONIX) 40 MG tablet Take 1 tablet (40 mg total) by mouth 2 (two) times daily. 60 tablet 6  . potassium chloride (K-DUR) 10 MEQ tablet Take 10 mEq by mouth daily as needed (when taking Furosemide).    . simvastatin (ZOCOR) 80 MG tablet Take 40 mg by mouth at bedtime.      [name] tiotropium (SPIRIVA) 18 MCG inhalation capsule Place 18 mcg into inhaler and inhale daily.     . vitamin C (ASCORBIC ACID) 500 MG tablet Take 500 mg by mouth 2 (two) times daily.     [name] warfarin (COUMADIN) 5 MG tablet Take 2.5 mg 1/4, 1/5 and 1/6 then repeat INR on 10/06/13 (Patient taking differently: Take 5 mg by mouth daily at 6 PM. ) 180 tablet 3   No current facility-administered medications for this encounter.    Review of patient's allergies indicates no known allergies.  REVIEW OF SYSTEMS: All systems negative except as listed in HPI, PMH and Problem list.   LVAD interrogation reveals:  Speed: 9200 Flow:  5.0 Power:  5.5 PI:  4.0  Alarms:  No alarms Events:  Rare PI event Fixed speed:  9200 Low speed limit: 8600  I reviewed the LVAD parameters from today, and compared the results to the patient's prior recorded data.  No programming changes were made.  The LVAD is functioning within specified parameters.  The patient performs LVAD self-test daily.  LVAD interrogation was  negative for any significant power changes or alarms. There were a few PI events daily, but nothing concerning and none back to back. LVAD equipment check completed and is in good working order.  Back-up equipment present.   LVAD education done on emergency procedures and precautions and reviewed exit site care.  Filed Vitals:   10/20/14 1147  Pulse: 71  Height: 6\' 1"  (1.854 m)  Weight: 225 lb 9.6 oz (102.331 kg)  SpO2: 94%    Physical Exam: MAP 88 GENERAL: Well appearing, male; NAD; wife present HEENT: normal  NECK: Supple, JVP to ~10. 2+ bilaterally, no bruits.  No lymphadenopathy or thyromegaly appreciated.   CARDIAC: Irregular Mechanical heart sounds with LVAD hum present.  LUNGS:  Clear  to auscultation bilaterally.  ABDOMEN:  Obese, Soft, round, nontender, positive bowel sounds x4.     LVAD exit site: well-healed and incorporated.  Dressing dry and intact.  No erythema, drainage, odor or tenderness.  Stabilization device present and accurately applied.  Driveline dressing is being changed daily per sterile technique. Changed in clinic EXTREMITIES:  Warm and dry, no cyanosis, clubbing, no edema NEUROLOGIC:  Alert and oriented x 4.  Gait steady.  No aphasia.  No dysarthria.  Affect pleasant.    EKG: A fib 68 bpm     ASSESSMENT AND PLAN:   1) Chronic Systolic HF: s/p LVAD implant 12/2012 - NYHA II-III symptoms and volume status elevated but A fib is likely contributing. . Give lasix 40 mg daily today. Adjust sliding scale diuretic regimen to take lasix 40 mg for weight 220 pounds with 40 meq of potassium -  Not on BB due to previous bradycardia. Continue losartan 25 mg daily.  MAP stable.  - Reinforced the need and importance of daily weights, a low sodium diet, and fluid restriction (less than 2 L a day). Instructed to call the HF clinic if weight increases more than 3 lbs overnight or 5 lbs in a week.  2)  Anticoagulation management  -  No ASA with recent bleeding. INR goal 1.8-2.5  .  INR 1.8  Discussed with HF pharmacy and they will adjust accordingly.  3) Anemia/ GIB  - Most recent GI bleed 09/21/14. Had bleeding noted in jejunum requiring 3 clips.  Continue PPI and stool softner.  4 LVAD - No issues. All parameters within normal limits.   - Check CBC, BMET, LDH and INR- Lab work stable. Coumadin adjusted. Per pharmacy 5. PAF -Had DC-CV 12/23 briefly back in NSR but on discharge 1/4 he chemically converted with amio 200 mg twice a day.   Was going to have DC-CV but back in NSR 10/14/14. Today he is back in A fib. Discussed rate control versus dc-cv. Today will get event monitor for 30 days to quantify a fib burden. If low A fib burden will stop amio and place on carvedilol . For now continue amiodarone 200 mg twice a day. HF Pharmacy to address coumadin.    6. Depression - Stable.  Continue Celexa 40 mg daily 7. OSA- not using CPAP.   Follow up in 4 weeks.   CLEGG,AMY NP-C  11:53 AM   Patient seen and examined with [name], NP. We discussed all aspects of the encounter. I agree with the assessment and plan as stated above. He is back in AF but much less symptomatic. It is now unclear if AF was truly responsible for recent decompensation. Will place 30-day monitor. If high AF burden will d/c amio and use b-blocker for rate control. If low AF burden will continue amio. VAD parameters reviewed personally.  Leonard Feigel,MD 11:40 AM

## 2014-10-20 NOTE — Progress Notes (Signed)
Symptom  Yes  No  Details   Angina         x Activity:   Claudication                x How far:  Syncope         x When:   Stroke         x   Orthopnea         x How many pillows:   2 for comfort  PND         x How often:  CPAP         x  How many hrs:  Not wearing cpap or O2 at night  Pedal edema         x   Abd fullness         x   N&V         x   Diaphoresis         x When:  Bleeding               x   Urine color    medium yellow  SOB        x  Activity:  incline  Palpitations         x When:  ICD shock         x   Hospitlizaitons        x        When/where/why:  10/24 - 08/04/14 anemia; SOB  ED visit         x When/where/why:  Other MD              x When/who/why:    Activity    No formal activity; sedentary  Fluid    No limitations  Diet    No limitations   Vital signs: HR:  66 MAP BP:  Doppler MAP 92;  Auto cuff:  89/71 (79) O2 Sat: 94 Wt:  225.6  lbs Last wt::  224.6  lbs Ht: 6'1"  LVAD interrogation reveals:  Speed: 9200 Flow:  4.7 Power:  5.4 PI:  5.5 Alarms: none Events: rare PI           Fixed speed:  9200 Low speed limit: 8600 Primary Controller:  Replace back up battery in 18 months (January 2017) Back up controller:   Replace back up battery in  18 months (January 2017)  LVAD exit site:  Well healed and incorporated. The velour is fully implanted at exit site. Dressing dry and intact. No erythema or drainage. Stabilization device present and accurately applied. Driveline dressing is being changed weekly per sterile technique using Sorbaview dressing with biopatch on exit site. Pt denies fever or chills. Dressing supplies provided to patient/caregiver.  Pt/caregiver deny any alarms or VAD equipment issues. VAD coordinator reviewed daily log from home for daily temperature, weight, and VAD parameters. Pt is performing daily controller and system monitor self tests along with completing weekly and monthly maintenance for LVAD equipment. LVAD equipment check  completed and is in good working order. Back-up equipment present. Back up system controller battery charged during clinic visit.   EKG reveals atrial fib - will arrange for 30 day event monitor per Dr. Haroldine Laws. No med changes at this time; will continue amiodarone 200 mg bid.  Awaiting appointment with Hematology; referral sent and spoke with scheduler.

## 2014-10-20 NOTE — Patient Instructions (Signed)
1.  Hold coumadin today; then decrease dose to 5 mg daily except 2.5 mg on Fridays.  Re-check INR on Thursday 11/03/14. 2.  Will need 30 day event monitor - expect call from [facility]. 3.  Return to clinic in 1 month.

## 2014-10-26 ENCOUNTER — Encounter (INDEPENDENT_AMBULATORY_CARE_PROVIDER_SITE_OTHER): Payer: Non-veteran care

## 2014-10-26 ENCOUNTER — Encounter: Payer: Self-pay | Admitting: *Deleted

## 2014-10-26 DIAGNOSIS — I480 Paroxysmal atrial fibrillation: Secondary | ICD-10-CM

## 2014-10-26 DIAGNOSIS — Z95811 Presence of heart assist device: Secondary | ICD-10-CM

## 2014-10-26 DIAGNOSIS — Z7901 Long term (current) use of anticoagulants: Secondary | ICD-10-CM

## 2014-10-26 DIAGNOSIS — T829XXD Unspecified complication of cardiac and vascular prosthetic device, implant and graft, subsequent encounter: Secondary | ICD-10-CM

## 2014-10-26 NOTE — Progress Notes (Signed)
Patient ID: [name], male   DOB: 04-Mar-1941, 74 y.o.   MRN: [id number] Spoke with Evelena Asa at [facility].  [employer] Medicare will cover MCT, which includes A-Fib burden, per Dr. Gillermina Hu' request.  [employer] verite 30 day cardiac event monitor applied to patient.

## 2014-10-27 ENCOUNTER — Other Ambulatory Visit (HOSPITAL_COMMUNITY): Payer: Self-pay | Admitting: *Deleted

## 2014-10-27 DIAGNOSIS — Z7901 Long term (current) use of anticoagulants: Secondary | ICD-10-CM

## 2014-10-27 DIAGNOSIS — Z95811 Presence of heart assist device: Secondary | ICD-10-CM

## 2014-11-01 ENCOUNTER — Ambulatory Visit: Payer: Medicare Other

## 2014-11-01 ENCOUNTER — Ambulatory Visit (HOSPITAL_BASED_OUTPATIENT_CLINIC_OR_DEPARTMENT_OTHER): Payer: No Typology Code available for payment source | Admitting: Hematology

## 2014-11-01 ENCOUNTER — Other Ambulatory Visit (HOSPITAL_BASED_OUTPATIENT_CLINIC_OR_DEPARTMENT_OTHER): Payer: Medicare Other

## 2014-11-01 ENCOUNTER — Encounter: Payer: Self-pay | Admitting: Hematology

## 2014-11-01 ENCOUNTER — Telehealth: Payer: Self-pay | Admitting: Hematology

## 2014-11-01 VITALS — BP 102/79 | HR 67 | Temp 97.6°F | Resp 18 | Ht 73.0 in | Wt 227.9 lb

## 2014-11-01 DIAGNOSIS — D649 Anemia, unspecified: Secondary | ICD-10-CM

## 2014-11-01 DIAGNOSIS — D500 Iron deficiency anemia secondary to blood loss (chronic): Secondary | ICD-10-CM

## 2014-11-01 DIAGNOSIS — K922 Gastrointestinal hemorrhage, unspecified: Secondary | ICD-10-CM

## 2014-11-01 LAB — COMPREHENSIVE METABOLIC PANEL (CC13)
ALT: 19 U/L (ref 0–55)
ANION GAP: 10 meq/L (ref 3–11)
AST: 34 U/L (ref 5–34)
Albumin: 3.8 g/dL (ref 3.5–5.0)
Alkaline Phosphatase: 66 U/L (ref 40–150)
BILIRUBIN TOTAL: 0.52 mg/dL (ref 0.20–1.20)
BUN: 15.2 mg/dL (ref 7.0–26.0)
CHLORIDE: 108 meq/L (ref 98–109)
CO2: 24 meq/L (ref 22–29)
Calcium: 9 mg/dL (ref 8.4–10.4)
Creatinine: 0.9 mg/dL (ref 0.7–1.3)
EGFR: 86 mL/min/{1.73_m2} — AB (ref >90)
Glucose: 94 mg/dl (ref 70–140)
Potassium: 4.1 mEq/L (ref 3.5–5.1)
Sodium: 141 mEq/L (ref 136–145)
TOTAL PROTEIN: 6.7 g/dL (ref 6.4–8.3)

## 2014-11-01 LAB — CBC & DIFF AND RETIC
BASO%: 0.3 % (ref 0.0–2.0)
Basophils Absolute: 0 10*3/uL (ref 0.0–0.1)
EOS ABS: 0.2 10*3/uL (ref 0.0–0.5)
EOS%: 3.7 % (ref 0.0–7.0)
HCT: 34.2 % — ABNORMAL LOW (ref 38.4–49.9)
HEMOGLOBIN: 10 g/dL — AB (ref 13.0–17.1)
Immature Retic Fract: 19.8 % — ABNORMAL HIGH (ref 3.00–10.60)
LYMPH%: 9.5 % — ABNORMAL LOW (ref 14.0–49.0)
MCH: 24.9 pg — AB (ref 27.2–33.4)
MCHC: 29.2 g/dL — AB (ref 32.0–36.0)
MCV: 85.1 fL (ref 79.3–98.0)
MONO#: 0.5 10*3/uL (ref 0.1–0.9)
MONO%: 8.4 % (ref 0.0–14.0)
NEUT#: 4.9 10*3/uL (ref 1.5–6.5)
NEUT%: 78.1 % — ABNORMAL HIGH (ref 39.0–75.0)
PLATELETS: 153 10*3/uL (ref 140–400)
RBC: 4.02 10*6/uL — ABNORMAL LOW (ref 4.20–5.82)
RDW: 18.5 % — ABNORMAL HIGH (ref 11.0–14.6)
RETIC %: 3.12 % — AB (ref 0.80–1.80)
RETIC CT ABS: 125.42 10*3/uL — AB (ref 34.80–93.90)
WBC: 6.3 10*3/uL (ref 4.0–10.3)
lymph#: 0.6 10*3/uL — ABNORMAL LOW (ref 0.9–3.3)

## 2014-11-01 LAB — LACTATE DEHYDROGENASE (CC13): LDH: 370 U/L — ABNORMAL HIGH (ref 125–245)

## 2014-11-01 NOTE — Progress Notes (Signed)
Checked in new pt with no financial concerns prior to seeing the dr.  [name] is here for hematology concern so financial assistance may not be needed but he has Raquel's card for any billing questions or concerns.

## 2014-11-01 NOTE — Progress Notes (Signed)
[place]  [phone] Fax:([phone] [place] consult Note   Patient Care Team: Thressa Sheller, MD as PCP - General (Internal Medicine) Jolaine Artist, MD as Attending Physician (Cardiology) Ivin Poot, MD as Attending Physician (Cardiothoracic Surgery) 11/01/2014  CHIEF COMPLAINTS/PURPOSE OF CONSULTATION:  Anemia  HISTORY OF PRESENTING ILLNESS:  [name] 74 y.o. male is here because of anemia.   He has extensive cardiac history. He had multiple heart attacks in the past 15 years, he had cardiac bypass surgery in 2003, PCI in 2007, long-standing history of , a atrial fibrillationnd was found to have ischemic cardiomyopathy with EF 27% in 2012.  He initially had AICD placed, and finally had LVAD placed in 2014 for his end-stage heart failure. He has been on Coumadin for more than 10 years for the cardiac issues.   He reports he has had anemia for several years, he used to take iron pill. He also received some IV iron a few years ago. In our electronic medical records, his lab showed anemia since 12/2012.   He had 3 episodes of GI bleeding since June of 2015 which are required blood transfusion, last episode was one month ago and he required 4u RBC  at that time. His EGD showed area of bleeding from duodenum all jejunum and he was treated endoscopyly.   He feels well overall. He denies significant chest pain or abdominal discomfort. He has mild dyspnea on moderate exertion. He has mild to moderate fatigue, but is able to function well at home,  does all his ADLs and daily activities.  MEDICAL HISTORY:  Past Medical History  Diagnosis Date  . Ischemic cardiomyopathy      CABG 2003, PCI 2007  EF 27%(myoview 2012  . Chronic systolic heart failure   . Hyperlipidemia   . Hypothyroidism   . Chronic anticoagulation     Afib and LVAD in 2014   . Obesity   . COPD (chronic obstructive pulmonary disease)   . Asbestosis(501)     "6 years in  the [facility]" (05/24/2013)  . Atrial fibrillation     permanent  . Paroxysmal ventricular tachycardia   . Coronary artery disease   . Implantable cardioverter-defibrillator Medtronic   . Hypertension   . Myocardial infarction 1990's-2000    "2 in  ~ the 1990's; 1 in ~ 2000" (05/24/2013)  . OSA on CPAP   . Depression   . LVAD (left ventricular assist device) present 12/2012    SURGICAL HISTORY: Past Surgical History  Procedure Laterality Date  . Coronary artery bypass graft  2003    "?X4" (05/24/2013)  . Cardiac defibrillator placement  2004; ~ 2010  . Insertion of implantable left ventricular assist device N/A 01/12/2013    Procedure: INSERTION OF IMPLANTABLE LEFT VENTRICULAR ASSIST DEVICE;  Surgeon: Ivin Poot, MD;  Location: [facility];  Service: Open Heart Surgery;  Laterality: N/A;   nitric oxide; Redo sternotomy  . Intraoperative transesophageal echocardiogram N/A 01/12/2013    Procedure: INTRAOPERATIVE TRANSESOPHAGEAL ECHOCARDIOGRAM;  Surgeon: Ivin Poot, MD;  Location: [facility];  Service: Open Heart Surgery;  Laterality: N/A;  . Colonoscopy N/A 03/09/2014    Procedure: COLONOSCOPY;  Surgeon: Inda Castle, MD;  Location: [facility];  Service: Endoscopy;  Laterality: N/A;  LVAD  patient  . Esophagogastroduodenoscopy N/A 03/09/2014    Procedure: ESOPHAGOGASTRODUODENOSCOPY (EGD);  Surgeon: Inda Castle, MD;  Location: [facility];  Service: Endoscopy;  Laterality: N/A;  . Givens capsule  study N/A 03/10/2014    Procedure: GIVENS CAPSULE STUDY;  Surgeon: Inda Castle, MD;  Location: [facility];  Service: Endoscopy;  Laterality: N/A;  . Enteroscopy N/A 07/27/2014    Procedure: ENTEROSCOPY;  Surgeon: Gatha Mayer, MD;  Location: [facility];  Service: Endoscopy;  Laterality: N/A;  LVAD patient   . Colonoscopy N/A 07/29/2014    Procedure: COLONOSCOPY;  Surgeon: Gatha Mayer, MD;  Location: [facility];  Service: Endoscopy;  Laterality: N/A;  . Givens capsule study N/A  07/30/2014    Procedure: GIVENS CAPSULE STUDY;  Surgeon: Gatha Mayer, MD;  Location: [facility];  Service: Endoscopy;  Laterality: N/A;  . Esophagogastroduodenoscopy N/A 08/01/2014    Procedure: ESOPHAGOGASTRODUODENOSCOPY (EGD);  Surgeon: Jerene Bears, MD;  Location: [facility] ENDOSCOPY;  Service: Endoscopy;  Laterality: N/A;  LVAD patient  . Left and right heart catheterization with coronary/graft angiogram  01/07/2013    Procedure: LEFT AND RIGHT HEART CATHETERIZATION WITH Beatrix Fetters;  Surgeon: Jolaine Artist, MD;  Location: [facility] CATH LAB;  Service: Cardiovascular;;  . Enteroscopy N/A 09/27/2014    Procedure: ENTEROSCOPY;  Surgeon: Gatha Mayer, MD;  Location: [facility] ENDOSCOPY;  Service: Endoscopy;  Laterality: N/A;    SOCIAL HISTORY: History   Social History  . Marital Status: Married    Spouse Name: N/A    Number of Children: N/A  . Years of Education: N/A   Occupational History  . Not on file.   Social History Main Topics  . Smoking status: Former Smoker -- 2.00 packs/day for 45 years    Types: Cigarettes    Quit date: 11/11/2001  . Smokeless tobacco: Never Used  . Alcohol Use: No     Comment: 05/24/2013 "use to drink beer; hardly nothing since 2003; once in awhile a beer "  . Drug Use: No  . Sexual Activity: No   Other Topics Concern  . Not on file   Social History Narrative    FAMILY HISTORY: Family History  Problem Relation Age of Onset  . Heart attack Mother   . Heart attack Father     ALLERGIES:  has No Known Allergies.  MEDICATIONS:  Current Outpatient Prescriptions  Medication Sig Dispense Refill  . albuterol (PROVENTIL HFA;VENTOLIN HFA) 108 (90 BASE) MCG/ACT inhaler Inhale 2 puffs into the lungs every 6 (six) hours as needed for wheezing or shortness of breath.    [name] albuterol (PROVENTIL) (2.5 MG/3ML) 0.083% nebulizer solution Take 2.5 mg by nebulization every 6 (six) hours as needed for wheezing or shortness of breath.     [name] amiodarone  (PACERONE) 200 MG tablet Take 1 tablet (200 mg total) by mouth 2 (two) times daily. 60 tablet 6  . budesonide-formoterol (SYMBICORT) 160-4.5 MCG/ACT inhaler Inhale 2 puffs into the lungs 2 (two) times daily.    . cholecalciferol (VITAMIN D) 1000 UNITS tablet Take 1,000 Units by mouth 2 (two) times daily.     . citalopram (CELEXA) 40 MG tablet Take 40 mg by mouth at bedtime.    . cyanocobalamin 1000 MCG tablet Take 1,000 mcg by mouth daily.     [name] docusate sodium (COLACE) 100 MG capsule Take 1-2 capsules (100-200 mg total) by mouth 2 (two) times daily. Take one tab in am and two tabs hs 10 capsule 0  . furosemide (LASIX) 40 MG tablet Take 40 mg by mouth daily as needed (fluid retention).    [name] levothyroxine (SYNTHROID, LEVOTHROID) 100 MCG tablet Take 100 mcg by mouth at bedtime.     [name]  losartan (COZAAR) 50 MG tablet Take 0.5 tablets (25 mg total) by mouth at bedtime. (Patient taking differently: Take 50 mg by mouth daily. ) 90 tablet 3  . Multiple Vitamins-Minerals (MULTIVITAMIN PO) Take 1 tablet by mouth daily.    . pantoprazole (PROTONIX) 40 MG tablet Take 1 tablet (40 mg total) by mouth 2 (two) times daily. 60 tablet 6  . potassium chloride (K-DUR) 10 MEQ tablet Take 20 mEq by mouth daily as needed (when taking Furosemide).     . simvastatin (ZOCOR) 80 MG tablet Take 40 mg by mouth at bedtime.      [name] tiotropium (SPIRIVA) 18 MCG inhalation capsule Place 18 mcg into inhaler and inhale daily.     . vitamin C (ASCORBIC ACID) 500 MG tablet Take 500 mg by mouth 2 (two) times daily.     [name] warfarin (COUMADIN) 5 MG tablet Take 2.5 mg 1/4, 1/5 and 1/6 then repeat INR on 10/06/13 (Patient taking differently: Take 5 mg by mouth daily at 6 PM. Daily except none on Fridays.) 180 tablet 3  . nitroGLYCERIN (NITROSTAT) 0.4 MG SL tablet Place 0.4 mg under the tongue every 5 (five) minutes as needed for chest pain.      No current facility-administered medications for this visit.    REVIEW OF SYSTEMS:     Constitutional: Denies fevers, chills or abnormal night sweats Eyes: Denies blurriness of vision, double vision or watery eyes Ears, nose, mouth, throat, and face: Denies mucositis or sore throat Respiratory: Denies cough, dyspnea or wheezes Cardiovascular: Denies palpitation, chest discomfort or lower extremity swelling Gastrointestinal:  Denies nausea, heartburn or change in bowel habits Skin: Denies abnormal skin rashes Lymphatics: Denies new lymphadenopathy or easy bruising Neurological:Denies numbness, tingling or new weaknesses Behavioral/Psych: Mood is stable, no new changes  All other systems were reviewed with the patient and are negative.  PHYSICAL EXAMINATION: ECOG PERFORMANCE STATUS: 1 - Symptomatic but completely ambulatory  Filed Vitals:   11/01/14 1433  BP: 102/79  Pulse: 67  Temp: 97.6 F (36.4 C)  Resp: 18   Filed Weights   11/01/14 1433  Weight: 227 lb 14.4 oz (103.375 kg)    GENERAL:alert, no distress and comfortable SKIN: skin color, texture, turgor are normal, no rashes or significant lesions EYES: normal, conjunctiva are pink and non-injected, sclera clear OROPHARYNX:no exudate, no erythema and lips, buccal mucosa, and tongue normal  NECK: supple, thyroid normal size, non-tender, without nodularity LYMPH:  no palpable lymphadenopathy in the cervical, axillary or inguinal LUNGS: clear to auscultation and percussion with normal breathing effort HEART: regular rate & rhythm and no murmurs and no lower extremity edema ABDOMEN:abdomen soft, non-tender and normal bowel sounds Musculoskeletal:no cyanosis of digits and no clubbing  PSYCH: alert & oriented x 3 with fluent speech NEURO: no focal motor/sensory deficits  LABORATORY DATA:  I have reviewed the data as listed CBC Latest Ref Rng 11/01/2014 10/20/2014 10/10/2014  WBC 4.0 - 10.3 10e3/uL 6.3 7.1 8.9  Hemoglobin 13.0 - 17.1 g/dL 10.0(L) 10.0(L) 10.0(L)  Hematocrit 38.4 - 49.9 % 34.2(L) 34.1(L) 32.6(L)   Platelets 140 - 400 10e3/uL 153 203 226.0    CMP Latest Ref Rng 11/01/2014 10/20/2014 10/10/2014  Glucose 70 - 140 mg/dl 94 103(H) 91  BUN 7.0 - 26.0 mg/dL 15.2 10 19   Creatinine 0.7 - 1.3 mg/dL 0.9 0.99 0.9  Sodium 136 - 145 mEq/L 141 140 135  Potassium 3.5 - 5.1 mEq/L 4.1 3.9 4.3  Chloride 96 - 112 mEq/L - 107 103  CO2 22 - 29 mEq/L 24 24 26   Calcium 8.4 - 10.4 mg/dL 9.0 9.6 9.9  Total Protein 6.4 - 8.3 g/dL 6.7 - -  Total Bilirubin 0.20 - 1.20 mg/dL 0.52 - -  Alkaline Phos 40 - 150 U/L 66 - -  AST 5 - 34 U/L 34 - -  ALT 0 - 55 U/L 19 - -     RADIOGRAPHIC STUDIES: I have personally reviewed the radiological images as listed and agreed with the findings in the report. No results found.  ASSESSMENT & PLAN:  74 year old Caucasian male, with extensive cardiac history, including CAD, age of 23 grays, ischemic cardiomyopathy status post LVAD, on long-term Coumadin, presented with multiple episodes of GI bleeding and anemia.  1. Anemia secondary to GI bleeding and a probable iron deficiency -Her GI bleeding is probably related to Coumadin and LVAD. 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 -I'll check his iron study to see if he has iron deficiency. If he does, which I suspect he would, I'll consider IV Feraheme. He is not a good candidate for oral iron, which will may mask his upper GI bleeding. 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 I'll check Coombs test also to ruled out autoimmune hemolysis. -If his anemia resolves with IV iron, I do not think he needs further workup for anemia. -He will have CBC checked monthly at his cardiologist's office. -Return to my clinic in 3 months with repeated CBC and iron study.  2. He will continue to follow-up with his primary care physician and cardiologist for his HTN, heart disease, hypothyroidism, OSA and mild liver cirrhosis..   All questions were answered. The patient knows  to call the clinic with any problems, questions or concerns.  I spent 30 minutes counseling the patient face to face. The total time spent in the appointment was 40 minutes and more than 50% was on counseling.     Truitt Merle, MD 11/01/2014   4:40 PM

## 2014-11-01 NOTE — Telephone Encounter (Signed)
gave pt avs report and appts for feb/may.

## 2014-11-02 ENCOUNTER — Encounter (HOSPITAL_COMMUNITY): Payer: Medicare Other

## 2014-11-02 LAB — IRON AND TIBC CHCC
%SAT: 6 % — AB (ref 20–55)
Iron: 20 ug/dL — ABNORMAL LOW (ref 42–163)
TIBC: 363 ug/dL (ref 202–409)
UIBC: 343 ug/dL (ref 117–376)

## 2014-11-02 LAB — FERRITIN CHCC: Ferritin: 18 ng/ml — ABNORMAL LOW (ref 22–316)

## 2014-11-03 ENCOUNTER — Other Ambulatory Visit: Payer: Medicare Other

## 2014-11-03 ENCOUNTER — Ambulatory Visit (HOSPITAL_COMMUNITY): Payer: Self-pay | Admitting: *Deleted

## 2014-11-03 ENCOUNTER — Other Ambulatory Visit (INDEPENDENT_AMBULATORY_CARE_PROVIDER_SITE_OTHER): Payer: Medicare Other | Admitting: *Deleted

## 2014-11-03 DIAGNOSIS — Z7901 Long term (current) use of anticoagulants: Secondary | ICD-10-CM

## 2014-11-03 DIAGNOSIS — Z95811 Presence of heart assist device: Secondary | ICD-10-CM

## 2014-11-03 LAB — PROTIME-INR
INR: 2 ratio — ABNORMAL HIGH (ref 0.8–1.0)
Prothrombin Time: 22.1 s — ABNORMAL HIGH (ref 9.6–13.1)

## 2014-11-03 LAB — DIRECT ANTIGLOBULIN TEST (NOT AT ARMC)
DAT (Complement): NEGATIVE
DAT IgG: NEGATIVE

## 2014-11-03 LAB — ERYTHROPOIETIN: Erythropoietin: 241.7 m[IU]/mL — ABNORMAL HIGH (ref 2.6–18.5)

## 2014-11-03 LAB — FOLATE RBC: RBC FOLATE: 1205 ng/mL (ref >280)

## 2014-11-03 LAB — VITAMIN B12: Vitamin B-12: 1041 pg/mL — ABNORMAL HIGH (ref 211–911)

## 2014-11-03 NOTE — Addendum Note (Signed)
Addended by: Eulis Foster on: 11/03/2014 08:56 AM   Modules accepted: Orders

## 2014-11-04 ENCOUNTER — Other Ambulatory Visit: Payer: Self-pay | Admitting: Hematology

## 2014-11-04 ENCOUNTER — Ambulatory Visit (HOSPITAL_BASED_OUTPATIENT_CLINIC_OR_DEPARTMENT_OTHER): Payer: Medicare Other

## 2014-11-04 DIAGNOSIS — D500 Iron deficiency anemia secondary to blood loss (chronic): Secondary | ICD-10-CM

## 2014-11-04 DIAGNOSIS — D62 Acute posthemorrhagic anemia: Secondary | ICD-10-CM

## 2014-11-04 MED ORDER — SODIUM CHLORIDE 0.9 % IV SOLN
Freq: Once | INTRAVENOUS | Status: AC
  Administered 2014-11-04: 13:00:00 via INTRAVENOUS

## 2014-11-04 MED ORDER — SODIUM CHLORIDE 0.9 % IV SOLN
510.0000 mg | Freq: Once | INTRAVENOUS | Status: AC
  Administered 2014-11-04: 510 mg via INTRAVENOUS
  Filled 2014-11-04: qty 17

## 2014-11-04 NOTE — Patient Instructions (Signed)

## 2014-11-04 NOTE — Progress Notes (Signed)
1410- Patient discharged in no acute distress, ambulatory.

## 2014-11-09 ENCOUNTER — Other Ambulatory Visit (HOSPITAL_COMMUNITY): Payer: Self-pay | Admitting: *Deleted

## 2014-11-09 MED ORDER — WARFARIN SODIUM 5 MG PO TABS: 5.0000 mg | ORAL_TABLET | Freq: Every day | ORAL | Status: DC

## 2014-11-11 ENCOUNTER — Ambulatory Visit (HOSPITAL_BASED_OUTPATIENT_CLINIC_OR_DEPARTMENT_OTHER): Payer: Medicare Other

## 2014-11-11 ENCOUNTER — Other Ambulatory Visit: Payer: Self-pay | Admitting: *Deleted

## 2014-11-11 DIAGNOSIS — D500 Iron deficiency anemia secondary to blood loss (chronic): Secondary | ICD-10-CM

## 2014-11-11 DIAGNOSIS — D62 Acute posthemorrhagic anemia: Secondary | ICD-10-CM

## 2014-11-11 MED ORDER — SODIUM CHLORIDE 0.9 % IV SOLN
510.0000 mg | Freq: Once | INTRAVENOUS | Status: AC
  Administered 2014-11-11: 510 mg via INTRAVENOUS
  Filled 2014-11-11: qty 17

## 2014-11-11 MED ORDER — SODIUM CHLORIDE 0.9 % IV SOLN
Freq: Once | INTRAVENOUS | Status: AC
  Administered 2014-11-11: 13:00:00 via INTRAVENOUS

## 2014-11-11 NOTE — Patient Instructions (Signed)

## 2014-11-14 ENCOUNTER — Ambulatory Visit (INDEPENDENT_AMBULATORY_CARE_PROVIDER_SITE_OTHER): Payer: Medicare Other | Admitting: *Deleted

## 2014-11-14 ENCOUNTER — Telehealth: Payer: Self-pay | Admitting: Cardiology

## 2014-11-14 DIAGNOSIS — I472 Ventricular tachycardia, unspecified: Secondary | ICD-10-CM

## 2014-11-14 LAB — MDC_IDC_ENUM_SESS_TYPE_REMOTE
Battery Voltage: 3.04 V
Brady Statistic RV Percent Paced: 0.23 %
Date Time Interrogation Session: 20160215213718
HighPow Impedance: 247 Ohm
HighPow Impedance: 40 Ohm
HighPow Impedance: 49 Ohm
Lead Channel Impedance Value: 418 Ohm
Lead Channel Sensing Intrinsic Amplitude: 6 mV
Lead Channel Sensing Intrinsic Amplitude: 6 mV
Lead Channel Setting Pacing Amplitude: 2.5 V
Lead Channel Setting Pacing Pulse Width: 0.4 ms
Lead Channel Setting Sensing Sensitivity: 0.3 mV
Zone Setting Detection Interval: 250 ms
Zone Setting Detection Interval: 310 ms
Zone Setting Detection Interval: 410 ms

## 2014-11-14 NOTE — Progress Notes (Signed)
Remote ICD transmission.   

## 2014-11-14 NOTE — Telephone Encounter (Signed)
Confirmed remote transmission w/ pt wife.   

## 2014-11-16 ENCOUNTER — Telehealth (HOSPITAL_COMMUNITY): Payer: Self-pay | Admitting: Infectious Diseases

## 2014-11-16 NOTE — Telephone Encounter (Signed)
Called [name] to reschedule [name]'s appointment in the [facility] next week. Confirmed that we will change her appointment from Tuesday 2/23 to Monday 2/22 @ 1400.

## 2014-11-16 NOTE — Progress Notes (Signed)
This encounter was created in error - please disregard.

## 2014-11-18 ENCOUNTER — Other Ambulatory Visit (HOSPITAL_COMMUNITY): Payer: Self-pay | Admitting: Infectious Diseases

## 2014-11-18 DIAGNOSIS — Z7901 Long term (current) use of anticoagulants: Secondary | ICD-10-CM

## 2014-11-18 DIAGNOSIS — Z95811 Presence of heart assist device: Secondary | ICD-10-CM

## 2014-11-21 ENCOUNTER — Ambulatory Visit (HOSPITAL_COMMUNITY)
Admission: RE | Admit: 2014-11-21 | Discharge: 2014-11-21 | Disposition: A | Discharge status: Home / Self Care | Payer: Medicare Other | Source: Ambulatory Visit | Attending: Internal Medicine | Admitting: Internal Medicine

## 2014-11-21 ENCOUNTER — Other Ambulatory Visit (HOSPITAL_COMMUNITY): Payer: Self-pay | Admitting: Infectious Diseases

## 2014-11-21 ENCOUNTER — Ambulatory Visit (HOSPITAL_COMMUNITY): Payer: Self-pay | Admitting: Infectious Diseases

## 2014-11-21 ENCOUNTER — Encounter (HOSPITAL_COMMUNITY): Payer: Self-pay

## 2014-11-21 DIAGNOSIS — I480 Paroxysmal atrial fibrillation: Secondary | ICD-10-CM | POA: Diagnosis not present

## 2014-11-21 DIAGNOSIS — Z95810 Presence of automatic (implantable) cardiac defibrillator: Secondary | ICD-10-CM | POA: Diagnosis not present

## 2014-11-21 DIAGNOSIS — I10 Essential (primary) hypertension: Secondary | ICD-10-CM | POA: Diagnosis not present

## 2014-11-21 DIAGNOSIS — E785 Hyperlipidemia, unspecified: Secondary | ICD-10-CM | POA: Insufficient documentation

## 2014-11-21 DIAGNOSIS — Z95811 Presence of heart assist device: Secondary | ICD-10-CM | POA: Diagnosis not present

## 2014-11-21 DIAGNOSIS — D649 Anemia, unspecified: Secondary | ICD-10-CM | POA: Diagnosis not present

## 2014-11-21 DIAGNOSIS — I4891 Unspecified atrial fibrillation: Secondary | ICD-10-CM | POA: Diagnosis not present

## 2014-11-21 DIAGNOSIS — G4733 Obstructive sleep apnea (adult) (pediatric): Secondary | ICD-10-CM | POA: Diagnosis not present

## 2014-11-21 DIAGNOSIS — Z951 Presence of aortocoronary bypass graft: Secondary | ICD-10-CM | POA: Insufficient documentation

## 2014-11-21 DIAGNOSIS — I5022 Chronic systolic (congestive) heart failure: Secondary | ICD-10-CM | POA: Insufficient documentation

## 2014-11-21 DIAGNOSIS — J449 Chronic obstructive pulmonary disease, unspecified: Secondary | ICD-10-CM | POA: Insufficient documentation

## 2014-11-21 DIAGNOSIS — Z79899 Other long term (current) drug therapy: Secondary | ICD-10-CM | POA: Insufficient documentation

## 2014-11-21 DIAGNOSIS — E669 Obesity, unspecified: Secondary | ICD-10-CM | POA: Diagnosis not present

## 2014-11-21 DIAGNOSIS — I255 Ischemic cardiomyopathy: Secondary | ICD-10-CM | POA: Insufficient documentation

## 2014-11-21 DIAGNOSIS — E039 Hypothyroidism, unspecified: Secondary | ICD-10-CM | POA: Insufficient documentation

## 2014-11-21 DIAGNOSIS — F329 Major depressive disorder, single episode, unspecified: Secondary | ICD-10-CM | POA: Diagnosis not present

## 2014-11-21 DIAGNOSIS — Z7901 Long term (current) use of anticoagulants: Secondary | ICD-10-CM

## 2014-11-21 DIAGNOSIS — I252 Old myocardial infarction: Secondary | ICD-10-CM | POA: Insufficient documentation

## 2014-11-21 DIAGNOSIS — I2510 Atherosclerotic heart disease of native coronary artery without angina pectoris: Secondary | ICD-10-CM | POA: Insufficient documentation

## 2014-11-21 LAB — BASIC METABOLIC PANEL
ANION GAP: 7 (ref 5–15)
BUN: 14 mg/dL (ref 6–23)
CALCIUM: 9.7 mg/dL (ref 8.4–10.5)
CO2: 24 mmol/L (ref 19–32)
CREATININE: 0.95 mg/dL (ref 0.50–1.35)
Chloride: 108 mmol/L (ref 96–112)
GFR calc Af Amer: >90 mL/min (ref >90)
GFR, EST NON AFRICAN AMERICAN: 81 mL/min — AB (ref >90)
GLUCOSE: 133 mg/dL — AB (ref 70–99)
Potassium: 4 mmol/L (ref 3.5–5.1)
SODIUM: 139 mmol/L (ref 135–145)

## 2014-11-21 LAB — CBC
HEMATOCRIT: 38.9 % — AB (ref 39.0–52.0)
Hemoglobin: 11.6 g/dL — ABNORMAL LOW (ref 13.0–17.0)
MCH: 26.5 pg (ref 26.0–34.0)
MCHC: 29.8 g/dL — ABNORMAL LOW (ref 30.0–36.0)
MCV: 88.8 fL (ref 78.0–100.0)
PLATELETS: 114 10*3/uL — AB (ref 150–400)
RBC: 4.38 MIL/uL (ref 4.22–5.81)
RDW: 23.8 % — ABNORMAL HIGH (ref 11.5–15.5)
WBC: 5.2 10*3/uL (ref 4.0–10.5)

## 2014-11-21 LAB — PROTIME-INR
INR: 2.15 — ABNORMAL HIGH (ref 0.00–1.49)
PROTHROMBIN TIME: 24.2 s — AB (ref 11.6–15.2)

## 2014-11-21 LAB — LACTATE DEHYDROGENASE: LDH: 305 U/L — ABNORMAL HIGH (ref 94–250)

## 2014-11-21 MED ORDER — LOSARTAN POTASSIUM 50 MG PO TABS: 75.0000 mg | ORAL_TABLET | Freq: Every day | ORAL | Status: DC

## 2014-11-21 NOTE — Progress Notes (Signed)
Patient ID: [name], male   DOB: June 02, 1941, 74 y.o.   MRN: [id number]  [facility] [place] (Dr. 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 Quit smoking 2003. Uses CPAP and O2 every night. He is s/p LVAD HM II implanted 01/12/13 under DT criteria  Admitted to [facility] 09/13/13 - 09/14/13 for increased fatigue and dyspnea. Found to have mild CHF and anemia. Diuresed with IV lasix and transitioned to PO lasix 40 mg twice a week. Discharge weight 203 lbs on inpatient scale.  Admitting labs revealed Hgb 7.7 for which he received 2 units PCs with appropriate rise in Hgb.   Admit 6/8-6/12 for symptomatic anemia. Had EGD/Colonoscopy which was unrevealing.  6 mm polyp clipped and removed. CT abdomen showed cirrhosis. Placed on heparin bridge. Capsule study pending. Sent home on lovenox. D/C weight 206 lbs.   Admitted to [facility] with wit GI bleed. GI consulted. Push enteroscopy 10/28 negative. Colonoscopy 10/29 without source of bleeding.Result of capsule endoscopy--> Showed bleeding from duodenum. EGD--->Bleeding from duodenum with clip applied. Overall he received  10 UPRBCs. Discharge weight was 207 pounds.   Ad[facility] to [facility] 09/21/14 with GI bleed . GI consulted. Enteroscopy showed bleed from jejunum requiring 3 clips. Overall he received 4 units PRBCs.  He went back in A fib and had DC-CV.  On the day discharge he was back in NSR on amiodarone 200 mg twice a day. Discharge weight was 214 pounds.   He returns for follow up. Overall he feeling pretty good. Denies SOB/PND/Orthopnea. Able to walk to his mailbox. Weight at home less than 220 pounds. Denies BRBPR. Taking all medications. Not using CPAP.  He continues weekly dressing changes.  He is back in atrial fibrillation but has not noticed this.    Denies LVAD  alarms. Denies driveline trauma, erythema or drainage.  Denies ICD shocks. Reports taking Coumadin as prescribed and adherence to anticoagulation based dietary restrictions. Denies bright red blood per rectum or melena, no dark urine or hematuria.   Past Medical History  Diagnosis Date  . Ischemic cardiomyopathy      CABG 2003, PCI 2007  EF 27%(myoview 2012  . Chronic systolic heart failure   . Hyperlipidemia   . Hypothyroidism   . Chronic anticoagulation     Afib and LVAD  . Obesity   . COPD (chronic obstructive pulmonary disease)   . Asbestosis(501)     "6 years in the [facility]" (05/24/2013)  . Atrial fibrillation     permanent  . Paroxysmal ventricular tachycardia   . Coronary artery disease   . Implantable cardioverter-defibrillator Medtronic   . Hypertension   . Myocardial infarction 1990's-2000    "2 in  ~ the 1990's; 1 in ~ 2000" (05/24/2013)  . OSA on CPAP   . Depression   . LVAD (left ventricular assist device) present 12/2012    Current Outpatient Prescriptions  Medication Sig Dispense Refill  . albuterol (PROVENTIL HFA;VENTOLIN HFA) 108 (90 BASE) MCG/ACT inhaler Inhale 2 puffs into the lungs every 6 (six) hours as needed for wheezing or shortness of breath.    [name] albuterol (PROVENTIL) (2.5 MG/3ML) 0.083% nebulizer solution Take 2.5 mg by nebulization every 6 (six) hours as needed for wheezing or shortness of breath.     [name] amiodarone (PACERONE) 200 MG tablet Take 1 tablet (200  mg total) by mouth 2 (two) times daily. 60 tablet 6  . budesonide-formoterol (SYMBICORT) 160-4.5 MCG/ACT inhaler Inhale 2 puffs into the lungs 2 (two) times daily.    . cholecalciferol (VITAMIN D) 1000 UNITS tablet Take 1,000 Units by mouth 2 (two) times daily.     . citalopram (CELEXA) 40 MG tablet Take 40 mg by mouth at bedtime.    . cyanocobalamin 1000 MCG tablet Take 1,000 mcg by mouth daily.     [name] docusate sodium (COLACE) 100 MG capsule Take 1-2 capsules (100-200 mg total) by mouth 2 (two) times  daily. Take one tab in am and two tabs hs 10 capsule 0  . furosemide (LASIX) 40 MG tablet Take 40 mg by mouth daily as needed (fluid retention).    [name] levothyroxine (SYNTHROID, LEVOTHROID) 100 MCG tablet Take 100 mcg by mouth at bedtime.     [name] losartan (COZAAR) 50 MG tablet Take 0.5 tablets (25 mg total) by mouth at bedtime. (Patient taking differently: Take 50 mg by mouth daily. ) 90 tablet 3  . Multiple Vitamins-Minerals (MULTIVITAMIN PO) Take 1 tablet by mouth daily.    . nitroGLYCERIN (NITROSTAT) 0.4 MG SL tablet Place 0.4 mg under the tongue every 5 (five) minutes as needed for chest pain.     . pantoprazole (PROTONIX) 40 MG tablet Take 1 tablet (40 mg total) by mouth 2 (two) times daily. 60 tablet 6  . potassium chloride (K-DUR) 10 MEQ tablet Take 20 mEq by mouth daily as needed (when taking Furosemide).     . simvastatin (ZOCOR) 80 MG tablet Take 40 mg by mouth at bedtime.      [name] tiotropium (SPIRIVA) 18 MCG inhalation capsule Place 18 mcg into inhaler and inhale daily.     . vitamin C (ASCORBIC ACID) 500 MG tablet Take 500 mg by mouth 2 (two) times daily.     [name] warfarin (COUMADIN) 5 MG tablet Take 1 tablet (5 mg total) by mouth daily at 6 PM. Daily except none on Fridays, as directed 200 tablet 3   No current facility-administered medications for this encounter.    Review of patient's allergies indicates no known allergies.  REVIEW OF SYSTEMS: All systems negative except as listed in HPI, PMH and Problem list.   LVAD interrogation reveals:  Speed: 9200 Flow:  5.1 Power:  5.5 PI:  6.5  Alarms:  No alarms Events:  Rare PI event. Had 9 events on 11/05/2014. No symprotmatic.  Fixed speed:  9200 Low speed limit: 8600  I reviewed the LVAD parameters from today, and compared the results to the patient's prior recorded data.  No programming changes were made.  The LVAD is functioning within specified parameters.  The patient performs LVAD self-test daily.  LVAD interrogation was negative  for any significant power changes or alarms. There were a few PI events daily, but nothing concerning and none back to back. LVAD equipment check completed and is in good working order.  Back-up equipment present.   LVAD education done on emergency procedures and precautions and reviewed exit site care.  Filed Vitals:   11/21/14 1358  BP: 96/0  Pulse: 65  Weight: 234 lb 3.2 oz (106.232 kg)  SpO2: 95%    Physical Exam: MAP 96 GENERAL: Well appearing, male; NAD; wife present HEENT: normal  NECK: Supple, JVP 6-7. 2+ bilaterally, no bruits.  No lymphadenopathy or thyromegaly appreciated.   CARDIAC: Irregular Mechanical heart sounds with LVAD hum present. Has event monitor on.  LUNGS:  Clear to auscultation bilaterally.  ABDOMEN:  Obese, Soft, round, nontender, positive bowel sounds x4.     LVAD exit site: well-healed and incorporated.  Dressing dry and intact.  No erythema, drainage, odor or tenderness.  Stabilization device present and accurately applied.  Driveline dressing is being changed daily per sterile technique. Changed in clinic EXTREMITIES:  Warm and dry, no cyanosis, clubbing, no edema NEUROLOGIC:  Alert and oriented x 4.  Gait steady.  No aphasia.  No dysarthria.  Affect pleasant.    EKG: A fib 60 bpm     ASSESSMENT AND PLAN:   1) Chronic Systolic HF: s/p LVAD implant 12/2012 for DT.  - NYHA II symptoms and volume status stable. Continue lasix as needed lasix 40 mg for weight 220 pounds with 40 meq of potassium -  Not on BB due to previous bradycardia.  -  MAP 96. Increase losartan to 75 mg daily, repeat BMET in 2 wks.   - Reinforced the need and importance of daily weights, a low sodium diet, and fluid restriction (less than 2 L a day). Instructed to call the HF clinic if weight increases more than 3 lbs overnight or 5 lbs in a week.  2)  Anticoagulation management -  INR goal 1.8-2.5 with history of GI bleeding.  He is not on ASA because of bleeding.    3) Anemia/ GIB  -  Most recent GI bleed 09/21/14. Had bleeding noted in jejunum requiring 3 clips.  Continue PPI and stool softner. No overt GI bleeding currently. 4 LVAD - No issues. All parameters within normal limits.   - Check CBC, BMET, LDH and INR-  5. PAF -Had DC-CV 12/23 briefly back in NSR but on discharge 1/4 he chemically converted with amio 200 mg twice a day.   Was going to have DC-CV but back in NSR 10/14/14. Today he is back in A fib. Discussed rate control versus dc-cv. Continue event monitor. He will complete 11/26/14. If in persistent atrial fibrillation, will consider repeat DCCV. For now continue amiodarone 200 mg twice a day. HF Pharmacy to address coumadin.  When he has labs in 2 wks, will check LFTs and TSH.  He will need regular eye exams while on amiodarone.   6. Depression - Stable.  Continue Celexa 40 mg daily 7. OSA- Using CPAP.   Follow up in 4 weeks.   CLEGG,AMY NP-C  2:27 PM  Patient seen with NP, agree with the above note.  VAD parameters are stable.  He is feeling good.  MAP is a bit high, will increase losartan as above.  Of note, he is in atrial fibrillation today.  He actually does not notice it.  I am unsure whether this is persistent now or remains paroxysmal.  He is wearing an event monitor, will let him complete this.  If he is in persistent afib, would be reasonable to try DCCV one more time.  Continue amiodarone at current dose.  If we cardiovert him, will need to increase INR to > 2 for at least a month prior to procedure and a month afterwards (current INR goal 1.8-2.3).   Loralie Champagne 11/21/2014

## 2014-11-21 NOTE — Progress Notes (Signed)
  Symptom Yes No Details  Angina  x Activity:  Claudication  x How far:  Syncope  x When:  Stroke  x   Orthopnea  x How many pillows: 2 for comfort only   PND  x How often:  CPAP x  How many hrs: all night   Pedal edema  x   Abd fullness  x   N&V  x   Diaphoresis x  When:sweating in sleep occasionally (baseline)  Bleeding  x Denies BRBPR, stool back to brown/normal since D/C iron   Urine  x Clear yellow, urine; occasionally dark in the morning  SOB  x Activity:  Palpitations  x When:  ICD shock  x   Hospitlizaitons  x When/where/why:  ED visit  x When/where/why:  Other MD x  When/who/why: Hematology Oncology   Activity   Does ok walking around aside from left leg giving out. Doing more around the house (getting mail/trash)   Fluid   2 L/d  Diet   Heart Healthy     BP: 96  Weight: 234.2 lb (reports 219-220 at home and are consistent) HR:  64  SPO2: 95% Last weight:  225.6 lb   VAD interrogation revealed: Speed:  9200 Flow:  5.1 Power:  5.5  PI: 6.5 Alarms:  none Events:  Rare PI events; 9 events on 11/05/14>>not symptomatic and does not recall being symptomatic Fixed speed: 9200 Low speed limit: 8600 Primary Controller:  Replace back up battery in  __17___  Months. (January 2017) Back up controller:   Replace back up battery in  __17___  Months. (January 2017)  I reviewed the LVAD parameters from today, and compared the results to the patient's prior recorded data.  No programming changes were made.  The LVAD is functioning within specified parameters.  The patient performs LVAD self-test daily.  LVAD interrogation was negative for any significant power changes, alarms or PI events/speed drops.  LVAD equipment check completed and is in good working order.  Back-up equipment present.   LVAD education done on emergency procedures and precautions and reviewed exit site care.   Drive line exit site well healed and incorporated. The velour is fully implanted at exit site.  Dressing dry and intact. No erythema or drainage. Stabilization device present and accurately applied. Driveline dressing is being changed weekly per sterile technique using Sorbaview dressing without biopatch on exit site d/t previous irritation. Pt denies fever or chills. Pt state they have adequate dressing supplies at home.   Pt/caregiver deny any alarms or VAD equipment issues. Pt is completing weekly and monthly maintenance for LVAD equipment.

## 2014-11-21 NOTE — Patient Instructions (Signed)
1. Increase your Losartan to 1.5 pills (75mg ) once a day.  2. Come to [facility] lab for blood work to check kidneys, thyroid and potassium levels in 2 weeks  3. No changes in your coumadin! 4. Doing great! Don't worry your monitor will be off soon and we will look for those results (fingers crossed)

## 2014-11-22 ENCOUNTER — Encounter (HOSPITAL_COMMUNITY): Payer: Medicare Other

## 2014-11-22 NOTE — Addendum Note (Signed)
Encounter addended by: Asencion Gowda, CCT on: 11/22/2014 10:46 AM<BR>     Documentation filed: Charges VN

## 2014-11-25 ENCOUNTER — Other Ambulatory Visit (HOSPITAL_COMMUNITY): Payer: Self-pay | Admitting: Infectious Diseases

## 2014-11-25 DIAGNOSIS — I519 Heart disease, unspecified: Secondary | ICD-10-CM

## 2014-11-25 DIAGNOSIS — Z95811 Presence of heart assist device: Secondary | ICD-10-CM

## 2014-11-28 ENCOUNTER — Encounter: Payer: Self-pay | Admitting: Cardiology

## 2014-12-05 ENCOUNTER — Ambulatory Visit (HOSPITAL_COMMUNITY): Payer: Self-pay | Admitting: Infectious Diseases

## 2014-12-05 ENCOUNTER — Encounter: Payer: Self-pay | Admitting: Internal Medicine

## 2014-12-05 ENCOUNTER — Other Ambulatory Visit (INDEPENDENT_AMBULATORY_CARE_PROVIDER_SITE_OTHER): Payer: Non-veteran care | Admitting: *Deleted

## 2014-12-05 DIAGNOSIS — Z79899 Other long term (current) drug therapy: Secondary | ICD-10-CM | POA: Diagnosis not present

## 2014-12-05 DIAGNOSIS — Z7901 Long term (current) use of anticoagulants: Secondary | ICD-10-CM

## 2014-12-05 LAB — COMPREHENSIVE METABOLIC PANEL
ALBUMIN: 4.2 g/dL (ref 3.5–5.2)
ALT: 33 U/L (ref 0–53)
AST: 45 U/L — ABNORMAL HIGH (ref 0–37)
Alkaline Phosphatase: 62 U/L (ref 39–117)
BUN: 20 mg/dL (ref 6–23)
CALCIUM: 10.4 mg/dL (ref 8.4–10.5)
CO2: 30 meq/L (ref 19–32)
Chloride: 106 mEq/L (ref 96–112)
Creatinine, Ser: 1.05 mg/dL (ref 0.40–1.50)
GFR: 73.57 mL/min (ref >60.00)
Glucose, Bld: 93 mg/dL (ref 70–99)
POTASSIUM: 3.9 meq/L (ref 3.5–5.1)
Sodium: 140 mEq/L (ref 135–145)
TOTAL PROTEIN: 7.2 g/dL (ref 6.0–8.3)
Total Bilirubin: 0.6 mg/dL (ref 0.2–1.2)

## 2014-12-05 LAB — PROTIME-INR
INR: 2 ratio — AB (ref 0.8–1.0)
PROTHROMBIN TIME: 21.4 s — AB (ref 9.6–13.1)

## 2014-12-05 LAB — TSH: TSH: 4.1 u[IU]/mL (ref 0.35–4.50)

## 2014-12-05 NOTE — Addendum Note (Signed)
Addended by: Eulis Foster on: 12/05/2014 08:25 AM   Modules accepted: Orders

## 2014-12-14 ENCOUNTER — Telehealth (HOSPITAL_COMMUNITY): Payer: Self-pay | Admitting: *Deleted

## 2014-12-14 NOTE — Telephone Encounter (Signed)
Event monitor reviewed by Dr Haroldine Laws, SR with periods of a-fib/flutter AF burden 15%, rate controlled.  Pt's wife is aware

## 2014-12-19 ENCOUNTER — Other Ambulatory Visit (HOSPITAL_COMMUNITY): Payer: Self-pay | Admitting: Infectious Diseases

## 2014-12-19 DIAGNOSIS — Z7901 Long term (current) use of anticoagulants: Secondary | ICD-10-CM

## 2014-12-19 DIAGNOSIS — Z95811 Presence of heart assist device: Secondary | ICD-10-CM

## 2014-12-19 DIAGNOSIS — R0602 Shortness of breath: Secondary | ICD-10-CM

## 2014-12-20 ENCOUNTER — Other Ambulatory Visit (HOSPITAL_COMMUNITY): Payer: Medicare Other

## 2014-12-20 ENCOUNTER — Ambulatory Visit (HOSPITAL_COMMUNITY): Payer: Self-pay | Admitting: *Deleted

## 2014-12-20 ENCOUNTER — Other Ambulatory Visit (HOSPITAL_COMMUNITY): Payer: Self-pay | Admitting: *Deleted

## 2014-12-20 ENCOUNTER — Ambulatory Visit (HOSPITAL_COMMUNITY)
Admission: RE | Admit: 2014-12-20 | Discharge: 2014-12-20 | Disposition: A | Discharge status: Home / Self Care | Payer: Medicare Other | Source: Ambulatory Visit | Attending: Internal Medicine | Admitting: Internal Medicine

## 2014-12-20 ENCOUNTER — Ambulatory Visit (HOSPITAL_COMMUNITY)
Admission: RE | Admit: 2014-12-20 | Discharge: 2014-12-20 | Disposition: A | Discharge status: Home / Self Care | Payer: Medicare Other | Source: Ambulatory Visit | Attending: Cardiology | Admitting: Cardiology

## 2014-12-20 VITALS — BP 92/0 | HR 62 | Ht 73.0 in | Wt 226.2 lb

## 2014-12-20 DIAGNOSIS — Z95811 Presence of heart assist device: Secondary | ICD-10-CM | POA: Insufficient documentation

## 2014-12-20 DIAGNOSIS — Z951 Presence of aortocoronary bypass graft: Secondary | ICD-10-CM | POA: Diagnosis not present

## 2014-12-20 DIAGNOSIS — I4891 Unspecified atrial fibrillation: Secondary | ICD-10-CM | POA: Diagnosis not present

## 2014-12-20 DIAGNOSIS — I255 Ischemic cardiomyopathy: Secondary | ICD-10-CM | POA: Insufficient documentation

## 2014-12-20 DIAGNOSIS — J449 Chronic obstructive pulmonary disease, unspecified: Secondary | ICD-10-CM | POA: Diagnosis not present

## 2014-12-20 DIAGNOSIS — Z8673 Personal history of transient ischemic attack (TIA), and cerebral infarction without residual deficits: Secondary | ICD-10-CM | POA: Diagnosis not present

## 2014-12-20 DIAGNOSIS — I10 Essential (primary) hypertension: Secondary | ICD-10-CM | POA: Diagnosis not present

## 2014-12-20 DIAGNOSIS — I5022 Chronic systolic (congestive) heart failure: Secondary | ICD-10-CM | POA: Diagnosis not present

## 2014-12-20 DIAGNOSIS — I083 Combined rheumatic disorders of mitral, aortic and tricuspid valves: Secondary | ICD-10-CM | POA: Insufficient documentation

## 2014-12-20 DIAGNOSIS — I2510 Atherosclerotic heart disease of native coronary artery without angina pectoris: Secondary | ICD-10-CM | POA: Insufficient documentation

## 2014-12-20 DIAGNOSIS — D509 Iron deficiency anemia, unspecified: Secondary | ICD-10-CM

## 2014-12-20 DIAGNOSIS — I519 Heart disease, unspecified: Secondary | ICD-10-CM | POA: Diagnosis not present

## 2014-12-20 DIAGNOSIS — Z7901 Long term (current) use of anticoagulants: Secondary | ICD-10-CM

## 2014-12-20 DIAGNOSIS — I252 Old myocardial infarction: Secondary | ICD-10-CM | POA: Diagnosis not present

## 2014-12-20 DIAGNOSIS — I480 Paroxysmal atrial fibrillation: Secondary | ICD-10-CM | POA: Diagnosis not present

## 2014-12-20 DIAGNOSIS — J61 Pneumoconiosis due to asbestos and other mineral fibers: Secondary | ICD-10-CM | POA: Insufficient documentation

## 2014-12-20 DIAGNOSIS — R0602 Shortness of breath: Secondary | ICD-10-CM

## 2014-12-20 LAB — COMPREHENSIVE METABOLIC PANEL
ALBUMIN: 3.8 g/dL (ref 3.5–5.2)
ALT: 24 U/L (ref 0–53)
AST: 32 U/L (ref 0–37)
Alkaline Phosphatase: 42 U/L (ref 39–117)
Anion gap: 10 (ref 5–15)
BILIRUBIN TOTAL: 1.3 mg/dL — AB (ref 0.3–1.2)
BUN: 17 mg/dL (ref 6–23)
CALCIUM: 10 mg/dL (ref 8.4–10.5)
CHLORIDE: 106 mmol/L (ref 96–112)
CO2: 22 mmol/L (ref 19–32)
CREATININE: 0.97 mg/dL (ref 0.50–1.35)
GFR calc Af Amer: >90 mL/min (ref >90)
GFR calc non Af Amer: 80 mL/min — ABNORMAL LOW (ref >90)
Glucose, Bld: 109 mg/dL — ABNORMAL HIGH (ref 70–99)
Potassium: 3.9 mmol/L (ref 3.5–5.1)
Sodium: 138 mmol/L (ref 135–145)
Total Protein: 7.2 g/dL (ref 6.0–8.3)

## 2014-12-20 LAB — CBC
HEMATOCRIT: 39.1 % (ref 39.0–52.0)
Hemoglobin: 12.2 g/dL — ABNORMAL LOW (ref 13.0–17.0)
MCH: 28.4 pg (ref 26.0–34.0)
MCHC: 31.2 g/dL (ref 30.0–36.0)
MCV: 91.1 fL (ref 78.0–100.0)
PLATELETS: 117 10*3/uL — AB (ref 150–400)
RBC: 4.29 MIL/uL (ref 4.22–5.81)
RDW: 22.2 % — ABNORMAL HIGH (ref 11.5–15.5)
WBC: 6.5 10*3/uL (ref 4.0–10.5)

## 2014-12-20 LAB — PROTIME-INR
INR: 2.38 — ABNORMAL HIGH (ref 0.00–1.49)
Prothrombin Time: 26.2 seconds — ABNORMAL HIGH (ref 11.6–15.2)

## 2014-12-20 LAB — LACTATE DEHYDROGENASE: LDH: 289 U/L — ABNORMAL HIGH (ref 94–250)

## 2014-12-20 LAB — BRAIN NATRIURETIC PEPTIDE: B Natriuretic Peptide: 197.6 pg/mL — ABNORMAL HIGH (ref 0.0–100.0)

## 2014-12-20 MED ORDER — AMIODARONE HCL 200 MG PO TABS: 200.0000 mg | ORAL_TABLET | Freq: Every day | ORAL | Status: DC

## 2014-12-20 NOTE — Progress Notes (Signed)
  Echocardiogram 2D Echocardiogram has been performed.  [name] M 12/20/2014, 3:55 PM

## 2014-12-20 NOTE — Progress Notes (Signed)
Symptom  Yes  No  Details   Angina         x Activity:   Claudication                x How far:  Right leg "gives way" after walking @ 1 block  Syncope         x When:   Stroke         x   Orthopnea         x How many pillows:   2 for comfort  PND         x How often:  CPAP         x  How many hrs:  6 hours  Pedal edema         x   Abd fullness         x   N&V         x Good appetite; full meal   Diaphoresis         x When:  Bleeding               x   Urine color    medium yellow  SOB        x  Activity:  Walking flat surface one block; incline  Palpitations         x When:  ICD shock         x   Hospitlizaitons               x When/where/why:    ED visit         x When/where/why:  Other MD              x When/who/why:    Activity    No formal activity; sedentary  Fluid    No limitations  Diet    No limitations   Vital signs: HR:  62 MAP BP: 120 Auto cuff:  111/83 (92) O2 Sat: 86 - 88 Wt:  226.2 lbs Last wt::  225.2  lbs Ht: 6'1"  LVAD interrogation reveals:  Speed: 9200 Flow:  4.9 Power:  5.5 PI:  6.4 Alarms: none Events: rare PI           Fixed speed:  9200 Low speed limit: 8600 Primary Controller:  Replace back up battery in 16 months (January 2017) Back up controller:   Replace back up battery in  16 months (January 2017)  LVAD exit site:  Well healed and incorporated. The velour is fully implanted at exit site. Dressing dry and intact. No erythema or drainage. Stabilization device present and accurately applied. Driveline dressing is being changed weekly per sterile technique using Sorbaview dressing with biopatch on exit site. Pt denies fever or chills. Dressing supplies provided to patient/caregiver.  Pt/caregiver deny any alarms or VAD equipment issues. VAD coordinator reviewed daily log from home for daily temperature, weight, and VAD parameters. Pt is performing daily controller and system monitor self tests along with completing weekly and monthly  maintenance for LVAD equipment. LVAD equipment check completed and is in good working order. Back-up equipment present. Back up system controller battery charged during clinic visit.   [facility] [place] (Dr. 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 Quit smoking 2003. Uses CPAP and O2 every  night. He is s/p LVAD HM II implanted 01/12/13 under DT criteria  Admitted to [facility] 09/13/13 - 09/14/13 for increased fatigue and dyspnea. Found to have mild CHF and anemia. Diuresed with IV lasix and transitioned to PO lasix 40 mg twice a week. Discharge weight 203 lbs on inpatient scale.  Admitting labs revealed Hgb 7.7 for which he received 2 units PCs with appropriate rise in Hgb.   Admit 6/8-6/12 for symptomatic anemia. Had EGD/Colonoscopy which was unrevealing.  6 mm polyp clipped and removed. CT abdomen showed cirrhosis. Placed on heparin bridge. Capsule study pending. Sent home on lovenox. D/C weight 206 lbs.   Admitted to [facility] with with GI bleed. GI consulted. Push enteroscopy 10/28 negative. Colonoscopy 10/29 without source of bleeding.Result of capsule endoscopy--> Showed bleeding from duodenum. EGD--->Bleeding from duodenum with clip applied. Overall he received  10 UPRBCs. Discharge weight was 207 pounds.   Ad[facility] to [facility] 09/21/14 with GI bleed . GI consulted. Enteroscopy showed bleed from jejunum requiring 3 clips. Overall he received 4 units PRBCs.  He went back in A fib and had DC-CV.  On the day discharge he was back in NSR on amiodarone 200 mg twice a day. Discharge weight was 214 pounds.   He returns for follow up. Overall he feeling pretty good. Denies SOB/PND/Orthopnea. Able to walk to his mailbox. Weight down 8 lbs. Denies BRBPR. Taking all medications. Not using CPAP.  He continues weekly dressing  changes.  Event monitor in 2/16 showed 15% atrial fibrillation burden.  By exam, he is in NSR today.  Patient's oxygen saturation ranges from 88%-90% today on room air, he does have a history of COPD.  As above, no significant dyspnea.  No cough or fever.  He wears oxygen at night but not during the day. He has not had to use Lasix.     Denies LVAD alarms. Denies driveline trauma, erythema or drainage.  Denies ICD shocks. Reports taking Coumadin as prescribed and adherence to anticoagulation based dietary restrictions. Denies bright red blood per rectum or melena, no dark urine or hematuria.   Labs (2/16): LDL 305, HCT 38.9 Labs (3/16): K 3.9, creatinine 1.05, LFTs normal  Past Medical History  Diagnosis Date  . Ischemic cardiomyopathy      CABG 2003, PCI 2007  EF 27%(myoview 2012  . Chronic systolic heart failure   . Hyperlipidemia   . Hypothyroidism   . Chronic anticoagulation     Afib and LVAD  . Obesity   . COPD (chronic obstructive pulmonary disease)   . Asbestosis(501)     "6 years in the [place]" (05/24/2013)  . Atrial fibrillation     permanent  . Paroxysmal ventricular tachycardia   . Coronary artery disease   . Implantable cardioverter-defibrillator [employer]   . Hypertension   . Myocardial infarction 1990's-2000    "2 in  ~ the 1990's; 1 in ~ 2000" (05/24/2013)  . OSA on CPAP   . Depression   . LVAD (left ventricular assist device) present 12/2012    Current Outpatient Prescriptions  Medication Sig Dispense Refill  . albuterol (PROVENTIL HFA;VENTOLIN HFA) 108 (90 BASE) MCG/ACT inhaler Inhale 2 puffs into the lungs every 6 (six) hours as needed for wheezing or shortness of breath.    [name] albuterol (PROVENTIL) (2.5 MG/3ML) 0.083% nebulizer solution Take 2.5 mg by nebulization every 6 (six) hours as needed for wheezing or shortness of breath.     [name] amiodarone (PACERONE) 200 MG tablet Take 1 tablet (200 mg  total) by mouth 2 (two) times daily. 60 tablet 6  . budesonide-formoterol  (SYMBICORT) 160-4.5 MCG/ACT inhaler Inhale 2 puffs into the lungs 2 (two) times daily.    . cholecalciferol (VITAMIN D) 1000 UNITS tablet Take 1,000 Units by mouth 2 (two) times daily.     . citalopram (CELEXA) 40 MG tablet Take 40 mg by mouth at bedtime.    . cyanocobalamin 1000 MCG tablet Take 1,000 mcg by mouth daily.     [name] docusate sodium (COLACE) 100 MG capsule Take 1-2 capsules (100-200 mg total) by mouth 2 (two) times daily. Take one tab in am and two tabs hs 10 capsule 0  . furosemide (LASIX) 40 MG tablet Take 40 mg by mouth daily as needed (fluid retention).    [name] levothyroxine (SYNTHROID, LEVOTHROID) 100 MCG tablet Take 100 mcg by mouth at bedtime.     [name] losartan (COZAAR) 50 MG tablet Take 1.5 tablets (75 mg total) by mouth at bedtime. 135 tablet 3  . Multiple Vitamins-Minerals (MULTIVITAMIN PO) Take 1 tablet by mouth daily.    . pantoprazole (PROTONIX) 40 MG tablet Take 1 tablet (40 mg total) by mouth 2 (two) times daily. 60 tablet 6  . potassium chloride (K-DUR) 10 MEQ tablet Take 20 mEq by mouth daily as needed (when taking Furosemide).     . simvastatin (ZOCOR) 80 MG tablet Take 40 mg by mouth at bedtime.      [name] tiotropium (SPIRIVA) 18 MCG inhalation capsule Place 18 mcg into inhaler and inhale daily.     . vitamin C (ASCORBIC ACID) 500 MG tablet Take 500 mg by mouth 2 (two) times daily.     [name] warfarin (COUMADIN) 5 MG tablet Take 1 tablet (5 mg total) by mouth daily at 6 PM. Daily except none on Fridays, as directed 200 tablet 3  . nitroGLYCERIN (NITROSTAT) 0.4 MG SL tablet Place 0.4 mg under the tongue every 5 (five) minutes as needed for chest pain.      No current facility-administered medications for this encounter.    Review of patient's allergies indicates no known allergies.  REVIEW OF SYSTEMS: All systems negative except as listed in HPI, PMH and Problem list.   Filed Vitals:   12/20/14 1400  BP: 92/0  Pulse: 62  Height: 6\' 1"  (1.854 m)  Weight: 226 lb 3.2 oz  (102.604 kg)  SpO2: 88%    Physical Exam: MAP 92 GENERAL: Well appearing, male; NAD; wife present HEENT: normal  NECK: Supple, JVP 6-7. 2+ bilaterally, no bruits.  No lymphadenopathy or thyromegaly appreciated.   CARDIAC: Mechanical heart sounds with LVAD hum present. LUNGS:  Clear to auscultation bilaterally.  ABDOMEN:  Obese, Soft, round, nontender, positive bowel sounds x4.     LVAD exit site: well-healed and incorporated.  Dressing dry and intact.  No erythema, drainage, odor or tenderness.  Stabilization device present and accurately applied.  Driveline dressing is being changed daily per sterile technique. Changed in clinic EXTREMITIES:  Warm and dry, no cyanosis, clubbing, no edema NEUROLOGIC:  Alert and oriented x 4.  Gait steady.  No aphasia.  No dysarthria.  Affect pleasant.     ASSESSMENT AND PLAN:   1) Chronic Systolic HF: s/p LVAD implant 12/2012 for DT. NYHA II symptoms and volume status stable.  - Continue lasix as needed lasix 40 mg for weight 220 pounds with 40 meq of potassium - Not on BB due to previous bradycardia.  - MAP 92. Continue current losartan.   -  Due for echo, will arrange.  - Reinforced the need and importance of daily weights, a low sodium diet, and fluid restriction (less than 2 L a day). Instructed to call the HF clinic if weight increases more than 3 lbs overnight or 5 lbs in a week.  2)  Anticoagulation management: INR goal 1.8-2.5 with history of GI bleeding.  He is not on ASA because of bleeding.    3) Anemia/ GIB: Most recent GI bleed 09/21/14. Had bleeding noted in jejunum requiring 3 clips.  Continue PPI and stool softener. No overt GI bleeding currently. CBC pending today.  4 LVAD: No issues. All parameters within normal limits.   - Check CBC, BMET, LDH and INR today.   5. PAF: Paroxysmal.  15% burden AF on 2/16 event monitor.  By exam, in NSR.   - ECG today.  - Decrease amiodarone to 200 mg daily, recent LFTs and TSH normal.  Should have yearly  eye exam.   6. Depression: Stable.  Continue Celexa 40 mg daily 7. OSA: Using CPAP.  8. Hypoxemia: Oxygen saturation ranging 88-90%.  Clear lungs on exam, no evidence for volume overload.  Uses home oxygen at night.  This may be due to his baseline COPD.  I will arrange CXR today.    Follow up in 4 weeks.   Loralie Champagne 12/20/2014

## 2014-12-20 NOTE — Patient Instructions (Signed)
1.  Decrease Amiodarone to 200 mg daily. 2. Will call you with CXR and lab results and Coumadin dosing. 3.  Return to [place] clinic in one month.

## 2014-12-21 ENCOUNTER — Telehealth (HOSPITAL_COMMUNITY): Payer: Self-pay | Admitting: *Deleted

## 2014-12-21 NOTE — Addendum Note (Signed)
Encounter addended by: Georga Kaufmann, CCT on: 12/21/2014  8:52 AM<BR>     Documentation filed: Charges VN

## 2014-12-21 NOTE — Telephone Encounter (Signed)
Called pt's wife per Dr. Aundra Dubin - CXR with no problems identified at yesterday's visit. Also informed pt I am awaiting contact information to [facility] and will give that to her as nearest VAD center based on their upcoming travel plans. Wife verbalized understanding of same.

## 2014-12-22 ENCOUNTER — Encounter (HOSPITAL_COMMUNITY): Payer: Self-pay | Admitting: *Deleted

## 2014-12-22 ENCOUNTER — Encounter: Payer: Self-pay | Admitting: *Deleted

## 2014-12-22 LAB — PREALBUMIN

## 2015-01-03 ENCOUNTER — Other Ambulatory Visit (INDEPENDENT_AMBULATORY_CARE_PROVIDER_SITE_OTHER): Payer: Non-veteran care | Admitting: *Deleted

## 2015-01-03 ENCOUNTER — Other Ambulatory Visit (HOSPITAL_COMMUNITY): Payer: Self-pay | Admitting: *Deleted

## 2015-01-03 ENCOUNTER — Ambulatory Visit (HOSPITAL_COMMUNITY): Payer: Self-pay | Admitting: *Deleted

## 2015-01-03 DIAGNOSIS — Z7901 Long term (current) use of anticoagulants: Secondary | ICD-10-CM

## 2015-01-03 DIAGNOSIS — Z95811 Presence of heart assist device: Secondary | ICD-10-CM

## 2015-01-03 DIAGNOSIS — I10 Essential (primary) hypertension: Secondary | ICD-10-CM

## 2015-01-03 LAB — PROTIME-INR
INR: 3.1 ratio — AB (ref 0.8–1.0)
Prothrombin Time: 33.4 s — ABNORMAL HIGH (ref 9.6–13.1)

## 2015-01-03 MED ORDER — LOSARTAN POTASSIUM 50 MG PO TABS: 75.0000 mg | ORAL_TABLET | Freq: Every day | ORAL | Status: DC

## 2015-01-10 ENCOUNTER — Other Ambulatory Visit: Payer: Self-pay | Admitting: *Deleted

## 2015-01-10 ENCOUNTER — Other Ambulatory Visit (HOSPITAL_COMMUNITY): Payer: Self-pay | Admitting: *Deleted

## 2015-01-10 DIAGNOSIS — Z7901 Long term (current) use of anticoagulants: Secondary | ICD-10-CM

## 2015-01-10 DIAGNOSIS — Z95811 Presence of heart assist device: Secondary | ICD-10-CM

## 2015-01-10 DIAGNOSIS — I4891 Unspecified atrial fibrillation: Secondary | ICD-10-CM

## 2015-01-10 DIAGNOSIS — I5020 Unspecified systolic (congestive) heart failure: Secondary | ICD-10-CM

## 2015-01-10 DIAGNOSIS — I10 Essential (primary) hypertension: Secondary | ICD-10-CM

## 2015-01-10 MED ORDER — PANTOPRAZOLE SODIUM 40 MG PO TBEC: 40.0000 mg | DELAYED_RELEASE_TABLET | Freq: Two times a day (BID) | ORAL | Status: DC

## 2015-01-10 MED ORDER — DOCUSATE SODIUM 100 MG PO CAPS: 100.0000 mg | ORAL_CAPSULE | Freq: Two times a day (BID) | ORAL | Status: DC

## 2015-01-10 MED ORDER — POTASSIUM CHLORIDE ER 10 MEQ PO TBCR: 20.0000 meq | EXTENDED_RELEASE_TABLET | Freq: Every day | ORAL | Status: DC | PRN

## 2015-01-10 MED ORDER — VITAMIN C 500 MG PO TABS: 500.0000 mg | ORAL_TABLET | Freq: Two times a day (BID) | ORAL | Status: AC

## 2015-01-10 MED ORDER — LEVOTHYROXINE SODIUM 100 MCG PO TABS: 100.0000 ug | ORAL_TABLET | Freq: Every day | ORAL | Status: DC

## 2015-01-10 MED ORDER — CYANOCOBALAMIN 1000 MCG PO TABS
1000.0000 ug | ORAL_TABLET | Freq: Every day | ORAL | Status: DC
Start: 2015-01-10 — End: 2015-04-24

## 2015-01-10 MED ORDER — LOSARTAN POTASSIUM 50 MG PO TABS: 75.0000 mg | ORAL_TABLET | Freq: Every day | ORAL | Status: DC

## 2015-01-10 MED ORDER — SIMVASTATIN 80 MG PO TABS: 40.0000 mg | ORAL_TABLET | Freq: Every day | ORAL | Status: AC

## 2015-01-10 MED ORDER — NITROGLYCERIN 0.4 MG SL SUBL: 0.4000 mg | SUBLINGUAL_TABLET | SUBLINGUAL | Status: DC | PRN

## 2015-01-10 MED ORDER — WARFARIN SODIUM 5 MG PO TABS: 5.0000 mg | ORAL_TABLET | Freq: Every day | ORAL | Status: DC

## 2015-01-10 MED ORDER — BUDESONIDE-FORMOTEROL FUMARATE 160-4.5 MCG/ACT IN AERO: 2.0000 | INHALATION_SPRAY | Freq: Two times a day (BID) | RESPIRATORY_TRACT | Status: AC

## 2015-01-10 MED ORDER — VITAMIN D 1000 UNITS PO TABS: 1000.0000 [IU] | ORAL_TABLET | Freq: Two times a day (BID) | ORAL | Status: AC

## 2015-01-10 MED ORDER — SIMVASTATIN 80 MG PO TABS: 40.0000 mg | ORAL_TABLET | Freq: Every day | ORAL | Status: DC

## 2015-01-10 MED ORDER — AMIODARONE HCL 200 MG PO TABS: 200.0000 mg | ORAL_TABLET | Freq: Every day | ORAL | Status: DC

## 2015-01-10 MED ORDER — FUROSEMIDE 40 MG PO TABS: 40.0000 mg | ORAL_TABLET | Freq: Every day | ORAL | Status: DC | PRN

## 2015-01-10 MED ORDER — CITALOPRAM HYDROBROMIDE 40 MG PO TABS: 40.0000 mg | ORAL_TABLET | Freq: Every day | ORAL | Status: AC

## 2015-01-10 MED ORDER — FUROSEMIDE 40 MG PO TABS
40.0000 mg | ORAL_TABLET | Freq: Every day | ORAL | Status: DC | PRN
Start: 2015-01-10 — End: 2015-04-24

## 2015-01-10 MED ORDER — VITAMIN D 1000 UNITS PO TABS: 1000.0000 [IU] | ORAL_TABLET | Freq: Two times a day (BID) | ORAL | Status: DC

## 2015-01-10 MED ORDER — PANTOPRAZOLE SODIUM 40 MG PO TBEC: 40.0000 mg | DELAYED_RELEASE_TABLET | Freq: Two times a day (BID) | ORAL | Status: AC

## 2015-01-10 MED ORDER — WARFARIN SODIUM 5 MG PO TABS
5.0000 mg | ORAL_TABLET | Freq: Every day | ORAL | Status: DC
Start: 2015-01-10 — End: 2015-01-10

## 2015-01-10 MED ORDER — TIOTROPIUM BROMIDE MONOHYDRATE 18 MCG IN CAPS: 18.0000 ug | ORAL_CAPSULE | Freq: Every day | RESPIRATORY_TRACT | Status: AC

## 2015-01-10 MED ORDER — CITALOPRAM HYDROBROMIDE 40 MG PO TABS: 40.0000 mg | ORAL_TABLET | Freq: Every day | ORAL | Status: DC

## 2015-01-10 MED ORDER — ALBUTEROL SULFATE HFA 108 (90 BASE) MCG/ACT IN AERS: 2.0000 | INHALATION_SPRAY | Freq: Four times a day (QID) | RESPIRATORY_TRACT | Status: DC | PRN

## 2015-01-10 MED ORDER — CYANOCOBALAMIN 1000 MCG PO TABS: 1000.0000 ug | ORAL_TABLET | Freq: Every day | ORAL | Status: DC

## 2015-01-10 MED ORDER — BUDESONIDE-FORMOTEROL FUMARATE 160-4.5 MCG/ACT IN AERO: 2.0000 | INHALATION_SPRAY | Freq: Two times a day (BID) | RESPIRATORY_TRACT | Status: DC

## 2015-01-10 MED ORDER — VITAMIN C 500 MG PO TABS: 500.0000 mg | ORAL_TABLET | Freq: Two times a day (BID) | ORAL | Status: DC

## 2015-01-19 ENCOUNTER — Telehealth: Payer: Self-pay | Admitting: Hematology

## 2015-01-19 ENCOUNTER — Other Ambulatory Visit (HOSPITAL_COMMUNITY): Payer: Self-pay | Admitting: Infectious Diseases

## 2015-01-19 ENCOUNTER — Telehealth (HOSPITAL_COMMUNITY): Payer: Self-pay | Admitting: Infectious Diseases

## 2015-01-19 ENCOUNTER — Ambulatory Visit (HOSPITAL_COMMUNITY): Payer: Self-pay | Admitting: Infectious Diseases

## 2015-01-19 ENCOUNTER — Encounter (HOSPITAL_COMMUNITY): Payer: Self-pay

## 2015-01-19 ENCOUNTER — Ambulatory Visit (HOSPITAL_COMMUNITY)
Admission: RE | Admit: 2015-01-19 | Discharge: 2015-01-19 | Disposition: A | Discharge status: Home / Self Care | Payer: Non-veteran care | Source: Ambulatory Visit | Attending: Internal Medicine | Admitting: Internal Medicine

## 2015-01-19 VITALS — BP 103/72 | HR 63 | Wt 226.0 lb

## 2015-01-19 DIAGNOSIS — Z5181 Encounter for therapeutic drug level monitoring: Secondary | ICD-10-CM | POA: Insufficient documentation

## 2015-01-19 DIAGNOSIS — I5022 Chronic systolic (congestive) heart failure: Secondary | ICD-10-CM | POA: Diagnosis not present

## 2015-01-19 DIAGNOSIS — Z95811 Presence of heart assist device: Secondary | ICD-10-CM

## 2015-01-19 DIAGNOSIS — F329 Major depressive disorder, single episode, unspecified: Secondary | ICD-10-CM | POA: Insufficient documentation

## 2015-01-19 DIAGNOSIS — Z79899 Other long term (current) drug therapy: Secondary | ICD-10-CM | POA: Diagnosis not present

## 2015-01-19 DIAGNOSIS — F4321 Adjustment disorder with depressed mood: Secondary | ICD-10-CM | POA: Diagnosis not present

## 2015-01-19 DIAGNOSIS — Z7901 Long term (current) use of anticoagulants: Secondary | ICD-10-CM

## 2015-01-19 DIAGNOSIS — R0902 Hypoxemia: Secondary | ICD-10-CM | POA: Diagnosis not present

## 2015-01-19 DIAGNOSIS — I480 Paroxysmal atrial fibrillation: Secondary | ICD-10-CM | POA: Diagnosis not present

## 2015-01-19 DIAGNOSIS — G4733 Obstructive sleep apnea (adult) (pediatric): Secondary | ICD-10-CM | POA: Diagnosis not present

## 2015-01-19 LAB — COMPREHENSIVE METABOLIC PANEL
ALT: 39 U/L (ref 0–53)
AST: 55 U/L — AB (ref 0–37)
Albumin: 3.7 g/dL (ref 3.5–5.2)
Alkaline Phosphatase: 61 U/L (ref 39–117)
Anion gap: 8 (ref 5–15)
BUN: 15 mg/dL (ref 6–23)
CO2: 23 mmol/L (ref 19–32)
Calcium: 9.6 mg/dL (ref 8.4–10.5)
Chloride: 106 mmol/L (ref 96–112)
Creatinine, Ser: 0.87 mg/dL (ref 0.50–1.35)
GFR calc non Af Amer: 84 mL/min — ABNORMAL LOW (ref >90)
Glucose, Bld: 112 mg/dL — ABNORMAL HIGH (ref 70–99)
POTASSIUM: 3.9 mmol/L (ref 3.5–5.1)
Sodium: 137 mmol/L (ref 135–145)
TOTAL PROTEIN: 6.8 g/dL (ref 6.0–8.3)
Total Bilirubin: 0.8 mg/dL (ref 0.3–1.2)

## 2015-01-19 LAB — CBC
HCT: 40.1 % (ref 39.0–52.0)
Hemoglobin: 13.1 g/dL (ref 13.0–17.0)
MCH: 29.6 pg (ref 26.0–34.0)
MCHC: 32.7 g/dL (ref 30.0–36.0)
MCV: 90.5 fL (ref 78.0–100.0)
Platelets: 115 10*3/uL — ABNORMAL LOW (ref 150–400)
RBC: 4.43 MIL/uL (ref 4.22–5.81)
RDW: 19.3 % — AB (ref 11.5–15.5)
WBC: 6.2 10*3/uL (ref 4.0–10.5)

## 2015-01-19 LAB — LACTATE DEHYDROGENASE: LDH: 410 U/L — AB (ref 94–250)

## 2015-01-19 LAB — TSH: TSH: 4.049 u[IU]/mL (ref 0.350–4.500)

## 2015-01-19 LAB — PROTIME-INR
INR: 1.87 — ABNORMAL HIGH (ref 0.00–1.49)
Prothrombin Time: 21.7 seconds — ABNORMAL HIGH (ref 11.6–15.2)

## 2015-01-19 LAB — MAGNESIUM: Magnesium: 1.9 mg/dL (ref 1.5–2.5)

## 2015-01-19 NOTE — Progress Notes (Signed)
Symptom Yes No Details  Angina  x Activity:  Claudication  x How far:  Syncope x  When: on 3/31 felt that he had a burst of atrial fib  Stroke  x   Orthopnea x  How many pillows: 2 stable and for comfort  PND  x How often:  CPAP x  How many hrs: 8 hours/ all night   Pedal edema  x   Abd fullness  x   N&V  x   Diaphoresis x  When: normal for him   Bleeding  x   Urine  x   SOB  x Activity:  Palpitations x  When:   ICD shock  x   Hospitlizaitons  x When/where/why:  ED visit  x When/where/why:  Other MD x  When/who/why: [facility]   Activity  x   Fluid  x Drinking Gatorade   Diet  x Adequate and normal diet and appetite    Kuron is here today for a 1 month follow up visit today with his wife Massie Maroon. Has been feeling good, however [place] SOB and weakness associated with some heart palpitations (assummed to be AFib). He did have 4 PI events that correlated with the time of the described event where his PI dropped to 5.5. Recovered after 10 minutes or so. Only one episode like that since we saw him last. HR today appears regular on the pulse-ox waveform and is in the 60s. No other complaints at this time.   BP: 100 doppler 103/72 (87) auto cuff  Weight: 226 lbs HR:  63 SPO2: 94% Last weight:    VAD interrogation revealed: Speed:  9200 Flow:  5.2 Power: 5.5  PI: 6.2 Alarms: none   Events: rare PI events (max 2 in 1 day). 4 events on 3/31.  Fixed speed:  Low speed limit:  Primary Controller:  Replace back up battery in  __15___  Months. (January 2017) Back up controller:   Replace back up battery in  __15___  Months. (January 2017)  I reviewed the LVAD parameters from today, and compared the results to the patient's prior recorded data.  No programming changes were made.  The LVAD is functioning within specified parameters.  The patient performs LVAD self-test daily.  LVAD interrogation was negative for any significant power  changes, alarms or PI events/speed drops.  LVAD equipment check completed and is in good working order.  Back-up equipment present.   LVAD education done on emergency procedures and precautions and reviewed exit site care.    Drive line exit site well healed and incorporated. The velour is fully implanted at exit site. Dressing dry and intact. No erythema or drainage. Stabilization device present and accurately applied. Driveline dressing is being changed weekly per sterile technique using Sorbaview dressing with biopatch on exit site. Pt denies fever or chills. Pt state they have adequate dressing supplies at home.   Pt/caregiver deny any alarms or VAD equipment issues. Pt is completing weekly and monthly maintenance for LVAD equipment. Reviewed calibrating batteries including recognizing prompt, steps to complete, and rationale for doing so. Pt verbalized understanding of same.   VAD coordinator reviewed daily log from home for daily temperature, weight, and VAD parameters. Pt is performing daily controller and system monitor self tests along with completing weekly and monthly maintenance for LVAD equipment.   LVAD equipment check completed and is in good working order. Back-up equipment present. LVAD education done on  emergency procedures and precautions and reviewed exit site care.   2 year INTERMACs completed this visit except for 6MW test. Complaining of right leg pain that is shooting down his buttock; denies numbness or tingling--planning on f/u with his PCP for this. Having trouble walking longer distances d/t pain.   Reviewed and demonstrated the following to patient and caregiver:               Reviewed the steps for replacing the running system controller with the back-up system controller (see patient handbook section 2 or the appropriate pamphlet).             X  Demonstrated (using the mock-driveline and controller) how to connect and disconnect the mock-driveline in back up system  controller in a timely manner (less than 10 seconds) with return demonstration by patient and caregiver.  Loop unavailable. Discussed               Reviewed system controller alarms and troubleshooting including hazard and advisory alarms and accessing alarm history. Re-enforced NOT TO attempt to perform any task displayed on the display screen alone or without calling the VAD pager [phone] (see patient handbook section 5 or the appropriate pamphlet).                       X  Reviewed how to handle an emergency including when the pump is running and when the pump has stopped (see patient handbook section 8). Call 911 first and then the VAD pager at [phone].             X   Reviewed 14-volt lithium ion battery calibration steps (see patient handbook section 3).            X   Reviewed contents of black bag and what must be with patient at all times.            X  Reviewed driveline exit site including cleansing, dressing, and immobilizing with an anchor device to prevent exit site trauma.             X  Reviewed weekly maintenance which includes: Reviewing "Replacing the [place] with a [employer]" pamphlet. Clean batteries, clips, and battery charger contacts.  Check cables for damages. Rotate batteries; keep all 8 charged.              X  Reviewed monthly maintenance which includes: Reviewing Alarms and Troubleshooting. Check battery manufacturer dates. Check use/charge cycles for each battery; remember to re-calibrate every 70 uses when prompted.              X  Additional pamphlets Guide to Replacing the [place] with the [place] for Patients and Their Caregivers and HM II Alarms for Patients and Their Caregivers given to patient.              X   Batteries Manufacture Date: Number of uses: Re-calibration  10/2012 829      562 130      865 Not being performed. All his batteries are in need of calibration. Went over  procedure with them and will start re-calibration of all batteries tonight.    Annual maintenance to be completed next visit since not present today.  6MW to be completed at next visit if his leg is better.   Backup system controller 11 volt battery charged during visit. Due to change 15 mo (January 2017)   Quality of  Life, KCCQ-12, and Neurocognitive trail making completed at this   Janene Madeira, RN [place] Coordinator   Office: [phone] 24/7 VAD Pager: [phone]

## 2015-01-19 NOTE — Telephone Encounter (Signed)
Patient wife called and needed to move appointment. Confirmed 05/10. Mailed calendar.

## 2015-01-19 NOTE — Telephone Encounter (Signed)
Called and left message re: labwork. INR therapeutic today 1.87. Would schedule appointment for [facility] lab to be done on 02/02/15.

## 2015-01-19 NOTE — Patient Instructions (Addendum)
1. Take a dose of your lasix today and tomorrow please. If it is too late for you today to take it, please take one in the morning tomorrow and the next day.  2. If you are sweating a lot, it is ok to drink more fluids. But please stop or cut back your Gatorade.  3. Back to the clinic in 6 weeks. Please bring with you your power module and patient cable and Charity fundraiser.  4. I will call you with your lab results today.

## 2015-01-19 NOTE — Progress Notes (Signed)
Patient ID: [name], male   DOB: [birth year], 74 y.o.   MRN: [id number]  [facility] [place] (Dr. 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 Quit smoking 2003. Uses CPAP and O2 every night. He is s/p LVAD HM II implanted 01/12/13 under DT criteria  Admitted to [facility] 09/13/13 - 09/14/13 for increased fatigue and dyspnea. Found to have mild CHF and anemia. Diuresed with IV lasix and transitioned to PO lasix 40 mg twice a week. Discharge weight 203 lbs on inpatient scale.  Admitting labs revealed Hgb 7.7 for which he received 2 units PCs with appropriate rise in Hgb.   Admit 6/8-6/12 for symptomatic anemia. Had EGD/Colonoscopy which was unrevealing.  6 mm polyp clipped and removed. CT abdomen showed cirrhosis. Placed on heparin bridge. Capsule study pending. Sent home on lovenox. D/C weight 206 lbs.   Admitted to [facility] with with GI bleed. GI consulted. Push enteroscopy 10/28 negative. Colonoscopy 10/29 without source of bleeding.Result of capsule endoscopy--> Showed bleeding from duodenum. EGD--->Bleeding from duodenum with clip applied. Overall he received  10 UPRBCs. Discharge weight was 207 pounds.   Admitted to [facility] 09/21/14 with GI bleed . GI consulted. Enteroscopy showed bleed from jejunum requiring 3 clips. Overall he received 4 units PRBCs.  He went back in A fib and had DC-CV.  On the day discharge he was back in NSR on amiodarone 200 mg twice a day. Discharge weight was 214 pounds.   He returns for follow up. Overall doing well. Denies SOB/PND/Orthopnea. Weight at home 219-222 pounds.  Denies BRBPR. Taking all medications. Not using CPAP.  He continues weekly dressing changes. Drinking gatorade throughout the day for the last month. Had ham last night.    Denies LVAD alarms. Denies driveline trauma,  erythema or drainage.  Denies ICD shocks. Reports taking Coumadin as prescribed and adherence to anticoagulation based dietary restrictions. Denies bright red blood per rectum or melena, no dark urine or hematuria.   Labs (2/16): LDL 305, HCT 38.9 Labs (3/16): K 3.9, creatinine 1.05, LFTs normal  Past Medical History  Diagnosis Date  . Ischemic cardiomyopathy      CABG 2003, PCI 2007  EF 27%(myoview 2012  . Chronic systolic heart failure   . Hyperlipidemia   . Hypothyroidism   . Chronic anticoagulation     Afib and LVAD  . Obesity   . COPD (chronic obstructive pulmonary disease)   . Asbestosis(501)     "6 years in the [facility]" (05/24/2013)  . Atrial fibrillation     permanent  . Paroxysmal ventricular tachycardia   . Coronary artery disease   . Implantable cardioverter-defibrillator Medtronic   . Hypertension   . Myocardial infarction 1990's-2000    "2 in  ~ the 1990's; 1 in ~ 2000" (05/24/2013)  . OSA on CPAP   . Depression   . LVAD (left ventricular assist device) present 12/2012    Current Outpatient Prescriptions  Medication Sig Dispense Refill  . albuterol (PROVENTIL HFA;VENTOLIN HFA) 108 (90 BASE) MCG/ACT inhaler Inhale 2 puffs into the lungs every 6 (six) hours as needed for wheezing or shortness of breath. 3 Inhaler 3  . albuterol (PROVENTIL) (2.5 MG/3ML) 0.083% nebulizer solution Take 2.5 mg by nebulization every 6 (six) hours as needed for wheezing or shortness of breath.     [name] amiodarone (  PACERONE) 200 MG tablet Take 1 tablet (200 mg total) by mouth daily. 90 tablet 3  . budesonide-formoterol (SYMBICORT) 160-4.5 MCG/ACT inhaler Inhale 2 puffs into the lungs 2 (two) times daily. 1 Inhaler 3  . cholecalciferol (VITAMIN D) 1000 UNITS tablet Take 1 tablet (1,000 Units total) by mouth 2 (two) times daily. 180 tablet 3  . citalopram (CELEXA) 40 MG tablet Take 1 tablet (40 mg total) by mouth at bedtime. 90 tablet 3  . cyanocobalamin 1000 MCG tablet Take 1 tablet (1,000 mcg total)  by mouth daily. 90 tablet 3  . docusate sodium (COLACE) 100 MG capsule Take 1-2 capsules (100-200 mg total) by mouth 2 (two) times daily. Take one tab in am and two tabs hs 180 capsule 3  . furosemide (LASIX) 40 MG tablet Take 1 tablet (40 mg total) by mouth daily as needed (fluid retention). 90 tablet 3  . levothyroxine (SYNTHROID, LEVOTHROID) 100 MCG tablet Take 1 tablet (100 mcg total) by mouth at bedtime. 90 tablet 3  . losartan (COZAAR) 50 MG tablet Take 1.5 tablets (75 mg total) by mouth at bedtime. 135 tablet 3  . Multiple Vitamins-Minerals (MULTIVITAMIN PO) Take 1 tablet by mouth daily.    . nitroGLYCERIN (NITROSTAT) 0.4 MG SL tablet Place 1 tablet (0.4 mg total) under the tongue every 5 (five) minutes as needed for chest pain. 30 tablet 3  . pantoprazole (PROTONIX) 40 MG tablet Take 1 tablet (40 mg total) by mouth 2 (two) times daily. 180 tablet 3  . potassium chloride (K-DUR) 10 MEQ tablet Take 2 tablets (20 mEq total) by mouth daily as needed (when taking Furosemide). 180 tablet 3  . simvastatin (ZOCOR) 80 MG tablet Take 0.5 tablets (40 mg total) by mouth at bedtime. 45 tablet 3  . tiotropium (SPIRIVA) 18 MCG inhalation capsule Place 1 capsule (18 mcg total) into inhaler and inhale daily. 90 capsule 3  . vitamin C (ASCORBIC ACID) 500 MG tablet Take 1 tablet (500 mg total) by mouth 2 (two) times daily. 180 tablet 3  . warfarin (COUMADIN) 5 MG tablet Take 1 tablet (5 mg total) by mouth daily at 6 PM. Daily except none on Fridays, as directed 200 tablet 3   No current facility-administered medications for this encounter.    Review of patient's allergies indicates no known allergies.  REVIEW OF SYSTEMS: All systems negative except as listed in HPI, PMH and Problem list.   Filed Vitals:   01/19/15 1620 01/19/15 1621  BP: 100/0 103/72  Pulse:  63  Weight:  226 lb (102.513 kg)  SpO2:  94%    VAD interrogation revealed: Speed: 9200 Flow: 5.2 Power: 5.5  PI: 6.2 Alarms: none   Events: rare PI events (max 2 in 1 day). 4 events on 3/31.  Fixed speed:  Low speed limit:  Primary Controller: Replace back up battery in __15___ Months. Back up controller: Replace back up battery in __15___ Months.  Physical Exam: MAP 88 GENERAL: Well appearing, male; NAD; wife present HEENT: normal  NECK: Supple, JVP ~10 , no bruits.  No lymphadenopathy or thyromegaly appreciated.   CARDIAC: Mechanical heart sounds with LVAD hum present. LUNGS:  Clear to auscultation bilaterally.  ABDOMEN:  Obese, Soft, round, nontender, positive bowel sounds x4.     LVAD exit site: well-healed and incorporated.  Dressing dry and intact.  No erythema, drainage, odor or tenderness.  Stabilization device present and accurately applied.  Driveline dressing is being changed daily per sterile technique. Changed in clinic  EXTREMITIES:  Warm and dry, no cyanosis, clubbing, no edema NEUROLOGIC:  Alert and oriented x 4.  Gait steady.  No aphasia.  No dysarthria.  Affect pleasant.     ASSESSMENT AND PLAN:   1) Chronic Systolic HF: s/p LVAD implant 12/2012 for DT. NYHA II symptoms and volume status stable.  - Volume status elevated likely due gatorade intake. Drinking gatorade daily. I have asked him to take lasix for the next 2 days then he will continue ontinue lasix as needed lasix 40 mg for weight 220 pounds with 40 meq of potassium. - Not on BB due to previous bradycardia.  . Continue current losartan.   - Reinforced the need and importance of daily weights, a low sodium diet, and fluid restriction (less than 2 L a day). Instructed to call the HF clinic if weight increases more than 3 lbs overnight or 5 lbs in a week.  2)  Anticoagulation management: INR goal 1.8-2.5 with history of GI bleeding.  He is not on ASA because of bleeding.   Continue coumadin. INR 1.87. See anticoagulation flow sheet.  3) Anemia/ GIB: Most recent GI bleed 09/21/14. Had bleeding noted in jejunum requiring 3 clips.   Continue PPI and stool softener. No BRBPR.  CBC today Hgb 13.1   4 LVAD: No issues. All parameters within normal limits.  Continue weekly dressing changes.  - Check CBC, BMET, LDH and INR today.   5. PAF: Paroxysmal.  15% burden AF on 2/16 event monitor.  By exam regular rate rhtym.    Continue  amiodarone to 200 mg daily, recent LFTs and TSH normal.  Should have yearly eye exam.   6. Depression: Stable.  Continue Celexa 40 mg daily 7. OSA: Using CPAP.  8. Hypoxemia:  Uses home oxygen at night.  This may be due to his baseline COPD.   Labs today: K 3.9 Creatinine 0.87 LDH 410 INR 1.87    Follow up in 6 weeks.     CLEGG,AMY NP-C  01/19/2015

## 2015-01-20 LAB — PREALBUMIN: Prealbumin: 26 mg/dL (ref 21–43)

## 2015-01-20 LAB — T4, FREE: Free T4: 1.33 ng/dL (ref 0.80–1.80)

## 2015-02-02 ENCOUNTER — Other Ambulatory Visit: Payer: Medicare Other

## 2015-02-02 ENCOUNTER — Ambulatory Visit: Payer: Medicare Other | Admitting: Hematology

## 2015-02-06 ENCOUNTER — Ambulatory Visit: Payer: Self-pay | Admitting: Infectious Diseases

## 2015-02-06 ENCOUNTER — Other Ambulatory Visit (INDEPENDENT_AMBULATORY_CARE_PROVIDER_SITE_OTHER): Payer: Non-veteran care | Admitting: *Deleted

## 2015-02-06 DIAGNOSIS — I480 Paroxysmal atrial fibrillation: Secondary | ICD-10-CM

## 2015-02-06 DIAGNOSIS — Z7901 Long term (current) use of anticoagulants: Secondary | ICD-10-CM

## 2015-02-06 DIAGNOSIS — Z95811 Presence of heart assist device: Secondary | ICD-10-CM

## 2015-02-06 LAB — PROTIME-INR
INR: 1.8 ratio — ABNORMAL HIGH (ref 0.8–1.0)
PROTHROMBIN TIME: 19.6 s — AB (ref 9.6–13.1)

## 2015-02-06 NOTE — Addendum Note (Signed)
Addended by: Eulis Foster on: 02/06/2015 08:21 AM   Modules accepted: Orders

## 2015-02-07 ENCOUNTER — Ambulatory Visit (HOSPITAL_BASED_OUTPATIENT_CLINIC_OR_DEPARTMENT_OTHER): Payer: No Typology Code available for payment source | Admitting: Hematology

## 2015-02-07 ENCOUNTER — Telehealth: Payer: Self-pay | Admitting: *Deleted

## 2015-02-07 ENCOUNTER — Other Ambulatory Visit (HOSPITAL_BASED_OUTPATIENT_CLINIC_OR_DEPARTMENT_OTHER): Payer: Medicare Other

## 2015-02-07 ENCOUNTER — Encounter: Payer: Self-pay | Admitting: Hematology

## 2015-02-07 ENCOUNTER — Other Ambulatory Visit: Payer: Self-pay | Admitting: *Deleted

## 2015-02-07 ENCOUNTER — Telehealth: Payer: Self-pay | Admitting: Hematology

## 2015-02-07 VITALS — BP 84/64 | HR 66 | Temp 98.1°F | Resp 18 | Ht 73.0 in | Wt 223.7 lb

## 2015-02-07 DIAGNOSIS — D509 Iron deficiency anemia, unspecified: Secondary | ICD-10-CM

## 2015-02-07 DIAGNOSIS — D649 Anemia, unspecified: Secondary | ICD-10-CM

## 2015-02-07 DIAGNOSIS — I959 Hypotension, unspecified: Secondary | ICD-10-CM | POA: Diagnosis not present

## 2015-02-07 DIAGNOSIS — Z7901 Long term (current) use of anticoagulants: Secondary | ICD-10-CM

## 2015-02-07 DIAGNOSIS — I255 Ischemic cardiomyopathy: Secondary | ICD-10-CM | POA: Diagnosis not present

## 2015-02-07 DIAGNOSIS — D500 Iron deficiency anemia secondary to blood loss (chronic): Secondary | ICD-10-CM

## 2015-02-07 DIAGNOSIS — K7460 Unspecified cirrhosis of liver: Secondary | ICD-10-CM

## 2015-02-07 DIAGNOSIS — I2510 Atherosclerotic heart disease of native coronary artery without angina pectoris: Secondary | ICD-10-CM

## 2015-02-07 LAB — CBC & DIFF AND RETIC
BASO%: 0.1 % (ref 0.0–2.0)
Basophils Absolute: 0 10*3/uL (ref 0.0–0.1)
EOS ABS: 0.2 10*3/uL (ref 0.0–0.5)
EOS%: 2.5 % (ref 0.0–7.0)
HEMATOCRIT: 38.2 % — AB (ref 38.4–49.9)
HGB: 12.2 g/dL — ABNORMAL LOW (ref 13.0–17.1)
IMMATURE RETIC FRACT: 25.6 % — AB (ref 3.00–10.60)
LYMPH%: 11 % — ABNORMAL LOW (ref 14.0–49.0)
MCH: 29.3 pg (ref 27.2–33.4)
MCHC: 31.9 g/dL — ABNORMAL LOW (ref 32.0–36.0)
MCV: 91.8 fL (ref 79.3–98.0)
MONO#: 0.5 10*3/uL (ref 0.1–0.9)
MONO%: 6.4 % (ref 0.0–14.0)
NEUT#: 5.8 10*3/uL (ref 1.5–6.5)
NEUT%: 80 % — ABNORMAL HIGH (ref 39.0–75.0)
PLATELETS: 131 10*3/uL — AB (ref 140–400)
RBC: 4.16 10*6/uL — ABNORMAL LOW (ref 4.20–5.82)
RDW: 18.2 % — AB (ref 11.0–14.6)
RETIC %: 4.17 % — AB (ref 0.80–1.80)
RETIC CT ABS: 173.47 10*3/uL — AB (ref 34.80–93.90)
WBC: 7.3 10*3/uL (ref 4.0–10.3)
lymph#: 0.8 10*3/uL — ABNORMAL LOW (ref 0.9–3.3)

## 2015-02-07 LAB — FERRITIN CHCC: FERRITIN: 33 ng/mL (ref 22–316)

## 2015-02-07 LAB — IRON AND TIBC CHCC
%SAT: 14 % — AB (ref 20–55)
Iron: 48 ug/dL (ref 42–163)
TIBC: 341 ug/dL (ref 202–409)
UIBC: 294 ug/dL (ref 117–376)

## 2015-02-07 NOTE — Telephone Encounter (Signed)
cld pt and left message of time & date of fera appts 5/12 & 5/13

## 2015-02-07 NOTE — Telephone Encounter (Signed)
Spoke with Melissa @ [facility], and informed her re:  Pt goes to [facility] for Coumadin monitoring.  Asked Melissa to add anemia labs from our office  to blood draw on 02/20/15 at the heart center as per Dr. Ernestina Penna instructions.  Melissa stated she would add on these labs as ordered in EPIC. Melissa's  Phone     [phone].  Called pt on cell phone and informed pt of same above info.  Pt voiced understanding.

## 2015-02-07 NOTE — Telephone Encounter (Signed)
per pof to sch pt appt-gave pt copy of sch-sent MW email to sch fera-pt aware-adv willc all after reply

## 2015-02-07 NOTE — Telephone Encounter (Signed)
Per staff message and POF I have scheduled appts. Advised scheduler of appts. JMW  

## 2015-02-07 NOTE — Progress Notes (Addendum)
[place]  [phone] [phone] [place] consult Note   Patient Care Team: Thressa Sheller, MD as PCP - General (Internal Medicine) Jolaine Artist, MD as Attending Physician (Cardiology) Ivin Poot, MD as Attending Physician (Cardiothoracic Surgery) 02/07/2015  CHIEF COMPLAINTS:  Follow up anemia  HISTORY OF PRESENTING ILLNESS:  [name] 74 y.o. male is here because of anemia.   He has extensive cardiac history. He had multiple heart attacks in the past 15 years, he had cardiac bypass surgery in 2003, PCI in 2007, long-standing history of , a atrial fibrillationnd was found to have ischemic cardiomyopathy with EF 27% in 2012.  He initially had AICD placed, and finally had LVAD placed in 2014 for his end-stage heart failure. He has been on Coumadin for more than 10 years for the cardiac issues.   He reports he has had anemia for several years, he used to take iron pill. He also received some IV iron a few years ago. In our electronic medical records, his lab showed anemia since 12/2012.   He had 3 episodes of GI bleeding since June of 2015 which are required blood transfusion, last episode was one month ago and he required 4u RBC  at that time. His EGD showed area of bleeding from duodenum all jejunum and he was treated endoscopyly.   He feels well overall. He denies significant chest pain or abdominal discomfort. He has mild dyspnea on moderate exertion. He has mild to moderate fatigue, but is able to function well at home,  does all his ADLs and daily activities.  INTERIM HISTORY [name] returns for follow-up. He is clinically stable, denies any new complaints since his last visit with me 3 months ago. He received IV Feraheme in February, and did feel better overall afterwards with improved energy level. He denies any bleeding, such as hematochezia or melena. He has been follow-up with his cardiologist and Coumadin clinic  closely.   MEDICAL HISTORY:  Past Medical History  Diagnosis Date  . Ischemic cardiomyopathy      CABG 2003, PCI 2007  EF 27%(myoview 2012  . Chronic systolic heart failure   . Hyperlipidemia   . Hypothyroidism   . Chronic anticoagulation     Afib and LVAD in 2014   . Obesity   . COPD (chronic obstructive pulmonary disease)   . Asbestosis(501)     "6 years in the [place]" (05/24/2013)  . Atrial fibrillation     permanent  . Paroxysmal ventricular tachycardia   . Coronary artery disease   . Implantable cardioverter-defibrillator Medtronic   . Hypertension   . Myocardial infarction 1990's-2000    "2 in  ~ the 1990's; 1 in ~ 2000" (05/24/2013)  . OSA on CPAP   . Depression   . LVAD (left ventricular assist device) present 12/2012    SURGICAL HISTORY: Past Surgical History  Procedure Laterality Date  . Coronary artery bypass graft  2003    "?X4" (05/24/2013)  . Cardiac defibrillator placement  2004; ~ 2010  . Insertion of implantable left ventricular assist device N/A 01/12/2013    Procedure: INSERTION OF IMPLANTABLE LEFT VENTRICULAR ASSIST DEVICE;  Surgeon: Ivin Poot, MD;  Location: [facility];  Service: Open Heart Surgery;  Laterality: N/A;   nitric oxide; Redo sternotomy  . Intraoperative transesophageal echocardiogram N/A 01/12/2013    Procedure: INTRAOPERATIVE TRANSESOPHAGEAL ECHOCARDIOGRAM;  Surgeon: Ivin Poot, MD;  Location: [facility];  Service: Open Heart Surgery;  Laterality:  N/A;  . Colonoscopy N/A 03/09/2014    Procedure: COLONOSCOPY;  Surgeon: Inda Castle, MD;  Location: [facility];  Service: Endoscopy;  Laterality: N/A;  LVAD  patient  . Esophagogastroduodenoscopy N/A 03/09/2014    Procedure: ESOPHAGOGASTRODUODENOSCOPY (EGD);  Surgeon: Inda Castle, MD;  Location: [facility];  Service: Endoscopy;  Laterality: N/A;  . Givens capsule study N/A 03/10/2014    Procedure: GIVENS CAPSULE STUDY;  Surgeon: Inda Castle, MD;  Location: [place];  Service:  Endoscopy;  Laterality: N/A;  . Enteroscopy N/A 07/27/2014    Procedure: ENTEROSCOPY;  Surgeon: Gatha Mayer, MD;  Location: [place];  Service: Endoscopy;  Laterality: N/A;  LVAD patient   . Colonoscopy N/A 07/29/2014    Procedure: COLONOSCOPY;  Surgeon: Gatha Mayer, MD;  Location: [place];  Service: Endoscopy;  Laterality: N/A;  . Givens capsule study N/A 07/30/2014    Procedure: GIVENS CAPSULE STUDY;  Surgeon: Gatha Mayer, MD;  Location: [place];  Service: Endoscopy;  Laterality: N/A;  . Esophagogastroduodenoscopy N/A 08/01/2014    Procedure: ESOPHAGOGASTRODUODENOSCOPY (EGD);  Surgeon: Jerene Bears, MD;  Location: [facility] ENDOSCOPY;  Service: Endoscopy;  Laterality: N/A;  LVAD patient  . Left and right heart catheterization with coronary/graft angiogram  01/07/2013    Procedure: LEFT AND RIGHT HEART CATHETERIZATION WITH Beatrix Fetters;  Surgeon: Jolaine Artist, MD;  Location: [facility] CATH LAB;  Service: Cardiovascular;;  . Enteroscopy N/A 09/27/2014    Procedure: ENTEROSCOPY;  Surgeon: Gatha Mayer, MD;  Location: [facility] ENDOSCOPY;  Service: Endoscopy;  Laterality: N/A;    SOCIAL HISTORY: History   Social History  . Marital Status: Married    Spouse Name: N/A  . Number of Children: N/A  . Years of Education: N/A   Occupational History  . Not on file.   Social History Main Topics  . Smoking status: Former Smoker -- 2.00 packs/day for 45 years    Types: Cigarettes    Quit date: 11/11/2001  . Smokeless tobacco: Never Used  . Alcohol Use: No     Comment: 05/24/2013 "use to drink beer; hardly nothing since 2003; once in awhile a beer "  . Drug Use: No  . Sexual Activity: No   Other Topics Concern  . Not on file   Social History Narrative    FAMILY HISTORY: Family History  Problem Relation Age of Onset  . Heart attack Mother   . Heart attack Father     ALLERGIES:  has No Known Allergies.  MEDICATIONS:  Current Outpatient Prescriptions    Medication Sig Dispense Refill  . albuterol (PROVENTIL HFA;VENTOLIN HFA) 108 (90 BASE) MCG/ACT inhaler Inhale 2 puffs into the lungs every 6 (six) hours as needed for wheezing or shortness of breath. 3 Inhaler 3  . amiodarone (PACERONE) 200 MG tablet Take 1 tablet (200 mg total) by mouth daily. 90 tablet 3  . budesonide-formoterol (SYMBICORT) 160-4.5 MCG/ACT inhaler Inhale 2 puffs into the lungs 2 (two) times daily. 1 Inhaler 3  . cholecalciferol (VITAMIN D) 1000 UNITS tablet Take 1 tablet (1,000 Units total) by mouth 2 (two) times daily. 180 tablet 3  . citalopram (CELEXA) 40 MG tablet Take 1 tablet (40 mg total) by mouth at bedtime. 90 tablet 3  . cyanocobalamin 1000 MCG tablet Take 1 tablet (1,000 mcg total) by mouth daily. 90 tablet 3  . docusate sodium (COLACE) 100 MG capsule Take 1-2 capsules (100-200 mg total) by mouth 2 (two) times daily. Take one tab in am  and two tabs hs 180 capsule 3  . furosemide (LASIX) 40 MG tablet Take 1 tablet (40 mg total) by mouth daily as needed (fluid retention). 90 tablet 3  . levothyroxine (SYNTHROID, LEVOTHROID) 100 MCG tablet Take 1 tablet (100 mcg total) by mouth at bedtime. 90 tablet 3  . losartan (COZAAR) 50 MG tablet Take 1.5 tablets (75 mg total) by mouth at bedtime. 135 tablet 3  . Multiple Vitamins-Minerals (MULTIVITAMIN PO) Take 1 tablet by mouth daily.    . pantoprazole (PROTONIX) 40 MG tablet Take 1 tablet (40 mg total) by mouth 2 (two) times daily. 180 tablet 3  . potassium chloride (K-DUR) 10 MEQ tablet Take 2 tablets (20 mEq total) by mouth daily as needed (when taking Furosemide). 180 tablet 3  . simvastatin (ZOCOR) 80 MG tablet Take 0.5 tablets (40 mg total) by mouth at bedtime. 45 tablet 3  . tiotropium (SPIRIVA) 18 MCG inhalation capsule Place 1 capsule (18 mcg total) into inhaler and inhale daily. 90 capsule 3  . vitamin C (ASCORBIC ACID) 500 MG tablet Take 1 tablet (500 mg total) by mouth 2 (two) times daily. 180 tablet 3  . warfarin  (COUMADIN) 5 MG tablet Take 1 tablet (5 mg total) by mouth daily at 6 PM. Daily except none on Fridays, as directed (Patient taking differently: Take 5 mg by mouth daily at 6 PM. Takes 2.5mg  on Fridays and Mondays;  All other days takes  5mg .) 200 tablet 3  . albuterol (PROVENTIL) (2.5 MG/3ML) 0.083% nebulizer solution Take 2.5 mg by nebulization every 6 (six) hours as needed for wheezing or shortness of breath.     . nitroGLYCERIN (NITROSTAT) 0.4 MG SL tablet Place 1 tablet (0.4 mg total) under the tongue every 5 (five) minutes as needed for chest pain. (Patient not taking: Reported on 02/07/2015) 30 tablet 3   No current facility-administered medications for this visit.    REVIEW OF SYSTEMS:   Constitutional: Denies fevers, chills or abnormal night sweats Eyes: Denies blurriness of vision, double vision or watery eyes Ears, nose, mouth, throat, and face: Denies mucositis or sore throat Respiratory: Denies cough, dyspnea or wheezes Cardiovascular: Denies palpitation, chest discomfort or lower extremity swelling Gastrointestinal:  Denies nausea, heartburn or change in bowel habits Skin: Denies abnormal skin rashes Lymphatics: Denies new lymphadenopathy or easy bruising Neurological:Denies numbness, tingling or new weaknesses Behavioral/Psych: Mood is stable, no new changes  All other systems were reviewed with the patient and are negative.  PHYSICAL EXAMINATION: ECOG PERFORMANCE STATUS: 1 - Symptomatic but completely ambulatory  Filed Vitals:   02/07/15 1323  BP: 84/64  Pulse: 66  Temp:   Resp:    Filed Weights   02/07/15 1258  Weight: 223 lb 11.2 oz (101.47 kg)    GENERAL:alert, no distress and comfortable SKIN: skin color, texture, turgor are normal, no rashes or significant lesions EYES: normal, conjunctiva are pink and non-injected, sclera clear OROPHARYNX:no exudate, no erythema and lips, buccal mucosa, and tongue normal  NECK: supple, thyroid normal size, non-tender,  without nodularity LYMPH:  no palpable lymphadenopathy in the cervical, axillary or inguinal LUNGS: clear to auscultation and percussion with normal breathing effort HEART: regular rate & rhythm and no murmurs and no lower extremity edema ABDOMEN:abdomen soft, non-tender and normal bowel sounds Musculoskeletal:no cyanosis of digits and no clubbing  PSYCH: alert & oriented x 3 with fluent speech NEURO: no focal motor/sensory deficits  LABORATORY DATA:  I have reviewed the data as listed CBC Latest Ref Rng  02/07/2015 01/19/2015 12/20/2014  WBC 4.0 - 10.3 10e3/uL 7.3 6.2 6.5  Hemoglobin 13.0 - 17.1 g/dL 12.2(L) 13.1 12.2(L)  Hematocrit 38.4 - 49.9 % 38.2(L) 40.1 39.1  Platelets 140 - 400 10e3/uL 131(L) 115(L) 117(L)    CMP Latest Ref Rng 01/19/2015 12/20/2014 12/05/2014  Glucose 70 - 99 mg/dL 112(H) 109(H) 93  BUN 6 - 23 mg/dL 15 17 20   Creatinine 0.50 - 1.35 mg/dL 0.87 0.97 1.05  Sodium 135 - 145 mmol/L 137 138 140  Potassium 3.5 - 5.1 mmol/L 3.9 3.9 3.9  Chloride 96 - 112 mmol/L 106 106 106  CO2 19 - 32 mmol/L 23 22 30   Calcium 8.4 - 10.5 mg/dL 9.6 10.0 10.4  Total Protein 6.0 - 8.3 g/dL 6.8 7.2 7.2  Total Bilirubin 0.3 - 1.2 mg/dL 0.8 1.3(H) 0.6  Alkaline Phos 39 - 117 U/L 61 42 62  AST 0 - 37 U/L 55(H) 32 45(H)  ALT 0 - 53 U/L 39 24 33   Results for [name], [name] (MRN [id number]) as of 02/07/2015 16:45  Ref. Range 02/07/2015 12:26  Iron Latest Ref Range: 42-163 ug/dL 48  UIBC Latest Ref Range: 117-376 ug/dL 294  TIBC Latest Ref Range: 202-409 ug/dL 341  %SAT Latest Ref Range: 20-55 % 14 (L)  Ferritin Latest Ref Range: 22-316 ng/ml 33    RADIOGRAPHIC STUDIES: I have personally reviewed the radiological images as listed and agreed with the findings in the report. No results found.  ASSESSMENT & PLAN:  74 year old Caucasian male, with extensive cardiac history, including CAD, age of 75 grays, ischemic cardiomyopathy status post LVAD, on long-term Coumadin, presented with  multiple episodes of GI bleeding and anemia.  1. Anemia secondary to GI bleeding and iron deficiency -HIS GI bleeding is probably related to Coumadin and LVAD. 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 -His iron studies showed low ferritin level and serum iron, consistent with iron deficient anemia. This is secondary to his GI bleeding. -His hemoglobin significant improved to 13 after IV Feraheme, but slightly dropped again today -He had low haptoglobin in the past, probably has low-grade of hemolysis secondary to LVAD.  -His ferritin is 33 today, I'll set up IV Feraheme in the next few weeks. -Repeat a CBC and iron study every 2 months, and IV Feraheme if ferritin less than 100  2. Hypotension -His blood pressure is always on the low side due to heart failure, usually systolic blood pressure in 80s to 90s -He is on fluid restriction for heart failure -repeated pressure 74/54, although he is not symptomatically and looks well clinically. -This is likely secondary to his diarrhea earlier today. -I encouraged him to drink more fluids today and repeat his blood pressure at home. He knows to call his cardiologist if his repeat her blood pressure still in 70s all if he becomes symptomatic   3. He will continue to follow-up with his primary care physician and cardiologist for his HTN, heart disease, hypothyroidism, OSA and mild liver cirrhosis..  Plan -Repeat CBC every 2 months at a [facility] clinic -IV Feraheme every 2 months if ferritin less than 100 -Return to clinic in 6 months  All questions were answered. The patient knows to call the clinic with any problems, questions or concerns.  I spent 20 minutes counseling the patient face to face. The total time spent in the appointment was 30 minutes and more than 50% was on counseling.     Truitt Merle, MD 02/07/2015  1:35 PM

## 2015-02-09 ENCOUNTER — Telehealth: Payer: Self-pay | Admitting: Hematology

## 2015-02-09 ENCOUNTER — Ambulatory Visit: Payer: Medicare Other

## 2015-02-09 ENCOUNTER — Telehealth: Payer: Self-pay | Admitting: *Deleted

## 2015-02-09 NOTE — Telephone Encounter (Signed)
pt cld back to state wants to CX fera appts & r/s next week-sent Burr Medico an emailt o adv-per her reply she stated to r/s for anytime next week-sent MW email to r/

## 2015-02-09 NOTE — Telephone Encounter (Signed)
Per staff message and POF I have scheduled appts. Advised scheduler of appts. JMW  

## 2015-02-09 NOTE — Telephone Encounter (Signed)
cld pt to give r/s feraheme appts time & dates-left messag

## 2015-02-10 ENCOUNTER — Ambulatory Visit: Payer: Medicare Other

## 2015-02-13 ENCOUNTER — Ambulatory Visit (HOSPITAL_BASED_OUTPATIENT_CLINIC_OR_DEPARTMENT_OTHER): Payer: No Typology Code available for payment source

## 2015-02-13 VITALS — BP 102/75 | HR 61 | Temp 97.0°F | Resp 20

## 2015-02-13 DIAGNOSIS — D509 Iron deficiency anemia, unspecified: Secondary | ICD-10-CM | POA: Diagnosis not present

## 2015-02-13 DIAGNOSIS — D62 Acute posthemorrhagic anemia: Secondary | ICD-10-CM

## 2015-02-13 MED ORDER — FERUMOXYTOL INJECTION 510 MG/17 ML
510.0000 mg | Freq: Once | INTRAVENOUS | Status: AC
  Administered 2015-02-13: 510 mg via INTRAVENOUS
  Filled 2015-02-13: qty 17

## 2015-02-13 MED ORDER — SODIUM CHLORIDE 0.9 % IV SOLN
Freq: Once | INTRAVENOUS | Status: AC
  Administered 2015-02-13: 10:00:00 via INTRAVENOUS

## 2015-02-13 NOTE — Progress Notes (Signed)
0950 Pt chin started to bleed; pt/wife states he used a straight razor to shave and cut his chin Sunday night and wife reports that it bleed for 5 minutes then stopped.  Today sitting in chair, chin just started bleeding without any contact to area.  Gauze/pressure applied for 5 minutes and band-aid applied over gauze.  Bleeding has stopped at present.  Re-enforced NOT to use straight razor, to use electric razor or grow a beard.  Pt and wife verbalized understanding of instructions.

## 2015-02-13 NOTE — Patient Instructions (Signed)

## 2015-02-14 ENCOUNTER — Telehealth: Payer: Self-pay | Admitting: *Deleted

## 2015-02-14 ENCOUNTER — Telehealth: Payer: Self-pay | Admitting: Hematology

## 2015-02-14 ENCOUNTER — Other Ambulatory Visit: Payer: Self-pay | Admitting: Hematology

## 2015-02-14 NOTE — Telephone Encounter (Signed)
Per staff message I have adjusted appts  

## 2015-02-14 NOTE — Telephone Encounter (Signed)
per [facility] to call pt and CX fera for 5/18-cld & left message for pt to adv 5/18 appt has been cx and I will call with updated time & dtae for next appt

## 2015-02-14 NOTE — Telephone Encounter (Signed)
sent MW email to adv per [facility] to r/s fera appts correctly.-Willc all pt once reply

## 2015-02-15 ENCOUNTER — Ambulatory Visit: Payer: Medicare Other

## 2015-02-20 ENCOUNTER — Ambulatory Visit (HOSPITAL_COMMUNITY): Payer: Self-pay | Admitting: Infectious Diseases

## 2015-02-20 ENCOUNTER — Other Ambulatory Visit (INDEPENDENT_AMBULATORY_CARE_PROVIDER_SITE_OTHER): Payer: Non-veteran care | Admitting: *Deleted

## 2015-02-20 DIAGNOSIS — D649 Anemia, unspecified: Secondary | ICD-10-CM | POA: Diagnosis not present

## 2015-02-20 DIAGNOSIS — Z7901 Long term (current) use of anticoagulants: Secondary | ICD-10-CM | POA: Diagnosis not present

## 2015-02-20 DIAGNOSIS — Z95811 Presence of heart assist device: Secondary | ICD-10-CM

## 2015-02-20 LAB — PROTIME-INR
INR: 2.4 ratio — AB (ref 0.8–1.0)
PROTHROMBIN TIME: 25.5 s — AB (ref 9.6–13.1)

## 2015-02-20 NOTE — Addendum Note (Signed)
Addended by: Eulis Foster on: 02/20/2015 09:02 AM   Modules accepted: Orders

## 2015-02-21 ENCOUNTER — Other Ambulatory Visit: Payer: Self-pay | Admitting: *Deleted

## 2015-02-28 ENCOUNTER — Encounter: Payer: Self-pay | Admitting: Internal Medicine

## 2015-02-28 ENCOUNTER — Telehealth: Payer: Self-pay

## 2015-02-28 ENCOUNTER — Ambulatory Visit (INDEPENDENT_AMBULATORY_CARE_PROVIDER_SITE_OTHER): Payer: Non-veteran care | Admitting: Internal Medicine

## 2015-02-28 VITALS — BP 98/88 | HR 59 | Ht 72.0 in | Wt 225.0 lb

## 2015-02-28 DIAGNOSIS — Z95810 Presence of automatic (implantable) cardiac defibrillator: Secondary | ICD-10-CM

## 2015-02-28 DIAGNOSIS — T827XXA Infection and inflammatory reaction due to other cardiac and vascular devices, implants and grafts, initial encounter: Secondary | ICD-10-CM | POA: Diagnosis not present

## 2015-02-28 DIAGNOSIS — I5022 Chronic systolic (congestive) heart failure: Secondary | ICD-10-CM

## 2015-02-28 DIAGNOSIS — I499 Cardiac arrhythmia, unspecified: Secondary | ICD-10-CM | POA: Diagnosis not present

## 2015-02-28 DIAGNOSIS — I255 Ischemic cardiomyopathy: Secondary | ICD-10-CM | POA: Diagnosis not present

## 2015-02-28 LAB — CUP PACEART INCLINIC DEVICE CHECK
Brady Statistic RV Percent Paced: 0.2 %
Brady Statistic RV Percent Paced: 0.2 %
Lead Channel Impedance Value: 456 Ohm
Lead Channel Pacing Threshold Amplitude: 1.75 V
Lead Channel Pacing Threshold Amplitude: 1.75 V
Lead Channel Sensing Intrinsic Amplitude: 6.8 mV
Lead Channel Setting Pacing Amplitude: 3.5 V
Lead Channel Setting Pacing Pulse Width: 0.7 ms
Lead Channel Setting Pacing Pulse Width: 0.7 ms
Lead Channel Setting Sensing Sensitivity: 0.3 mV
Lead Channel Setting Sensing Sensitivity: 0.3 mV
MDC IDC MSMT BATTERY VOLTAGE: 3.05 V
MDC IDC MSMT BATTERY VOLTAGE: 3.05 V
MDC IDC MSMT LEADCHNL RV IMPEDANCE VALUE: 456 Ohm
MDC IDC MSMT LEADCHNL RV PACING THRESHOLD PULSEWIDTH: 0.7 ms
MDC IDC MSMT LEADCHNL RV PACING THRESHOLD PULSEWIDTH: 0.7 ms
MDC IDC MSMT LEADCHNL RV SENSING INTR AMPL: 6.8 mV
MDC IDC SESS DTM: 20160531102145
MDC IDC SESS DTM: 20160531114028
MDC IDC SET LEADCHNL RV PACING AMPLITUDE: 3.5 V
MDC IDC SET ZONE DETECTION INTERVAL: 310 ms
Zone Setting Detection Interval: 250 ms
Zone Setting Detection Interval: 410 ms
Zone Setting Detection Interval: 250 ms
Zone Setting Detection Interval: 310 ms
Zone Setting Detection Interval: 410 ms

## 2015-02-28 NOTE — Telephone Encounter (Signed)
Called pt, per Dr. Caryl Comes, Dr. Lovena Le can see pt in [place] office at 2 pm

## 2015-02-28 NOTE — Patient Instructions (Signed)
Medication Instructions:  Your physician recommends that you continue on your current medications as directed. Please refer to the Current Medication list given to you today.   Labwork: None  Testing/Procedures: None  Follow-Up: Your physician wants you to follow-up in: one year with Dr. Caryl Comes.  You will receive a reminder letter in the mail two months in advance. If you don't receive a letter, please call our office to schedule the follow-up appointment.   Any Other Special Instructions Will Be Listed Below (If Applicable). Remote monitoring is used to monitor your Pacemaker of ICD from home. This monitoring reduces the number of office visits required to check your device to one time per year. It allows [place] to keep an eye on the functioning of your device to ensure it is working properly. You are scheduled for a device check from home on May 30, 2015. You may send your transmission at any time that day. If you have a wireless device, the transmission will be sent automatically. After your physician reviews your transmission, you will receive a postcard with your next transmission date.

## 2015-02-28 NOTE — Progress Notes (Signed)
Patient Care Team: Thressa Sheller, MD as PCP - General (Internal Medicine) Jolaine Artist, MD as Attending Physician (Cardiology) Ivin Poot, MD as Attending Physician (Cardiothoracic Surgery)   HPI  [name] is a 74 y.o. male Seen in followup for ICD implantation for primary prevention in the setting of ischemic heart disease with prior bypass surgery and depressed left ventricular function. He has permanent atrial fibrillation and is on Coumadin  He is on ASA 81 with VAD, low dose 2/2 bleeding    Myoview scanning 2012 demonstrating prior infarct with ejection fraction 25-30% with no ischemia    Followed by CHF clinic and recently implanted with VAD    He is doing much better  Scant and only occasional edema  He's having paroxysms of atrial fibrillation which are associated with shortness of breath. Presumably these are associated with a rapid rate.  He also is concerned about a protrusion on the left aspect of his device incision. His device generator had been replaced about 5 years ago at the [place]  Past Medical History  Diagnosis Date  . Ischemic cardiomyopathy      CABG 2003, PCI 2007  EF 27%(myoview 2012  . Chronic systolic heart failure   . Hyperlipidemia   . Hypothyroidism   . Chronic anticoagulation     Afib and LVAD  . Obesity   . COPD (chronic obstructive pulmonary disease)   . Asbestosis(501)     "6 years in the [place]" (05/24/2013)  . Atrial fibrillation     permanent  . Paroxysmal ventricular tachycardia   . Coronary artery disease   . Implantable cardioverter-defibrillator Medtronic   . Hypertension   . Myocardial infarction 1990's-2000    "2 in  ~ the 1990's; 1 in ~ 2000" (05/24/2013)  . OSA on CPAP   . Depression   . LVAD (left ventricular assist device) present 12/2012    Past Surgical History  Procedure Laterality Date  . Coronary artery bypass graft  2003    "?X4" (05/24/2013)  . Cardiac defibrillator placement  2004; ~ 2010    . Insertion of implantable left ventricular assist device N/A 01/12/2013    Procedure: INSERTION OF IMPLANTABLE LEFT VENTRICULAR ASSIST DEVICE;  Surgeon: Ivin Poot, MD;  Location: [place];  Service: Open Heart Surgery;  Laterality: N/A;   nitric oxide; Redo sternotomy  . Intraoperative transesophageal echocardiogram N/A 01/12/2013    Procedure: INTRAOPERATIVE TRANSESOPHAGEAL ECHOCARDIOGRAM;  Surgeon: Ivin Poot, MD;  Location: [place];  Service: Open Heart Surgery;  Laterality: N/A;  . Colonoscopy N/A 03/09/2014    Procedure: COLONOSCOPY;  Surgeon: Inda Castle, MD;  Location: [place];  Service: Endoscopy;  Laterality: N/A;  LVAD  patient  . Esophagogastroduodenoscopy N/A 03/09/2014    Procedure: ESOPHAGOGASTRODUODENOSCOPY (EGD);  Surgeon: Inda Castle, MD;  Location: [place];  Service: Endoscopy;  Laterality: N/A;  . Givens capsule study N/A 03/10/2014    Procedure: GIVENS CAPSULE STUDY;  Surgeon: Inda Castle, MD;  Location: [place];  Service: Endoscopy;  Laterality: N/A;  . Enteroscopy N/A 07/27/2014    Procedure: ENTEROSCOPY;  Surgeon: Gatha Mayer, MD;  Location: [place];  Service: Endoscopy;  Laterality: N/A;  LVAD patient   . Colonoscopy N/A 07/29/2014    Procedure: COLONOSCOPY;  Surgeon: Gatha Mayer, MD;  Location: [place];  Service: Endoscopy;  Laterality: N/A;  . Givens capsule study N/A 07/30/2014    Procedure: GIVENS CAPSULE STUDY;  Surgeon: Glendell Docker  Simonne Maffucci, MD;  Location: [place];  Service: Endoscopy;  Laterality: N/A;  . Esophagogastroduodenoscopy N/A 08/01/2014    Procedure: ESOPHAGOGASTRODUODENOSCOPY (EGD);  Surgeon: Jerene Bears, MD;  Location: [facility] ENDOSCOPY;  Service: Endoscopy;  Laterality: N/A;  LVAD patient  . Left and right heart catheterization with coronary/graft angiogram  01/07/2013    Procedure: LEFT AND RIGHT HEART CATHETERIZATION WITH Beatrix Fetters;  Surgeon: Jolaine Artist, MD;  Location: [facility] CATH LAB;   Service: Cardiovascular;;  . Enteroscopy N/A 09/27/2014    Procedure: ENTEROSCOPY;  Surgeon: Gatha Mayer, MD;  Location: [facility] ENDOSCOPY;  Service: Endoscopy;  Laterality: N/A;    Current Outpatient Prescriptions  Medication Sig Dispense Refill  . albuterol (PROVENTIL HFA;VENTOLIN HFA) 108 (90 BASE) MCG/ACT inhaler Inhale 2 puffs into the lungs every 6 (six) hours as needed for wheezing or shortness of breath. 3 Inhaler 3  . albuterol (PROVENTIL) (2.5 MG/3ML) 0.083% nebulizer solution Take 2.5 mg by nebulization every 6 (six) hours as needed for wheezing or shortness of breath.     [name] amiodarone (PACERONE) 200 MG tablet Take 1 tablet (200 mg total) by mouth daily. 90 tablet 3  . budesonide-formoterol (SYMBICORT) 160-4.5 MCG/ACT inhaler Inhale 2 puffs into the lungs 2 (two) times daily. 1 Inhaler 3  . cholecalciferol (VITAMIN D) 1000 UNITS tablet Take 1 tablet (1,000 Units total) by mouth 2 (two) times daily. 180 tablet 3  . citalopram (CELEXA) 40 MG tablet Take 1 tablet (40 mg total) by mouth at bedtime. 90 tablet 3  . cyanocobalamin 1000 MCG tablet Take 1 tablet (1,000 mcg total) by mouth daily. 90 tablet 3  . docusate sodium (COLACE) 100 MG capsule Take 1-2 capsules (100-200 mg total) by mouth 2 (two) times daily. Take one tab in am and two tabs hs 180 capsule 3  . furosemide (LASIX) 40 MG tablet Take 1 tablet (40 mg total) by mouth daily as needed (fluid retention). 90 tablet 3  . levothyroxine (SYNTHROID, LEVOTHROID) 100 MCG tablet Take 1 tablet (100 mcg total) by mouth at bedtime. 90 tablet 3  . losartan (COZAAR) 50 MG tablet Take 1.5 tablets (75 mg total) by mouth at bedtime. 135 tablet 3  . Multiple Vitamins-Minerals (MULTIVITAMIN PO) Take 1 tablet by mouth daily.    . nitroGLYCERIN (NITROSTAT) 0.4 MG SL tablet Place 1 tablet (0.4 mg total) under the tongue every 5 (five) minutes as needed for chest pain. 30 tablet 3  . pantoprazole (PROTONIX) 40 MG tablet Take 1 tablet (40 mg total) by  mouth 2 (two) times daily. 180 tablet 3  . potassium chloride (K-DUR) 10 MEQ tablet Take 2 tablets (20 mEq total) by mouth daily as needed (when taking Furosemide). 180 tablet 3  . simvastatin (ZOCOR) 80 MG tablet Take 0.5 tablets (40 mg total) by mouth at bedtime. 45 tablet 3  . tiotropium (SPIRIVA) 18 MCG inhalation capsule Place 1 capsule (18 mcg total) into inhaler and inhale daily. 90 capsule 3  . vitamin C (ASCORBIC ACID) 500 MG tablet Take 1 tablet (500 mg total) by mouth 2 (two) times daily. 180 tablet 3  . warfarin (COUMADIN) 5 MG tablet Take 1 tablet (5 mg total) by mouth daily at 6 PM. Daily except none on Fridays, as directed (Patient taking differently: Take 5 mg by mouth daily at 6 PM. Takes 2.5mg  on Fridays and Mondays;  All other days takes  5mg .) 200 tablet 3   No current facility-administered medications for this visit.    No  Known Allergies  Review of Systems negative except from HPI and PMH  Physical Exam BP 98/88 mmHg  Pulse 59  Ht 6' (1.829 m)  Wt 225 lb (102.059 kg)  BMI 30.51 kg/m2 Well developed and well nourished in no acute distress HENT normal E scleral and icterus clear Neck Supple JVP flat; carotids brisk and full Device pocket with a nonhealing sore at the lateral aspect. There is tethering. Clear to ausculation  Regular rate and rhythm, VAD hum Soft with active bowel sounds No clubbing cyanosis  Edema Alert and oriented, grossly normal motor and sensory function Skin Warm and Dry  ECG demonstrates sinus rhythm at 59 Intervals 20/09/43 Left axis deviation T wave inversions anterior precordium  Labs reviewed: Amiodarone surveillance labs normal 4/16  Assessment and  Plan  Ventricular tachycardia  No intercurrent Ventricular tachycardia  Ischemic cardiomyopathy  Congestive heart failure-chronic-systolic  Atrial fibrillation-paroxysmal   LVAD present  Implantable defibrillator-Medtronic  The patient's device was interrogated.  The  information was reviewed. No changes were made in the programming.    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  There is a nonhealing sore and tethering. I will refer him to Dr. Elliot Cousin for extraction.  This will need to be coordinated with the VAD team as relates to anticoagulation.  Looking back through his laboratories, his ferritin level was only mildly depressed, raising the possibility at least in my mind, as to whether some of his anemia may be related to chronic infection  He is currently euvolemic. We will continue current medications.

## 2015-03-01 ENCOUNTER — Encounter: Payer: Self-pay | Admitting: Internal Medicine

## 2015-03-01 ENCOUNTER — Ambulatory Visit (INDEPENDENT_AMBULATORY_CARE_PROVIDER_SITE_OTHER): Payer: Non-veteran care | Admitting: Internal Medicine

## 2015-03-01 VITALS — BP 108/68 | HR 64 | Ht 72.0 in | Wt 227.2 lb

## 2015-03-01 DIAGNOSIS — T827XXA Infection and inflammatory reaction due to other cardiac and vascular devices, implants and grafts, initial encounter: Secondary | ICD-10-CM

## 2015-03-01 NOTE — Progress Notes (Signed)
HPI [name] is referred today for consideration of ICD system extraction. He is a pleasant 74 yo man with a h/o advanced heart failure, COPD, and ischemic cardiomyopathy, and s/p CABG. The patient has not had any fever or chills. He has developed minimal drainage but thinning and subsequent erosion of the skin around his ICD. His initial device was placed in 2004. He had a generator change approx. 5 years ago. He noted that a scab formed over his device approx. 4 months ago.  No Known Allergies   Current Outpatient Prescriptions  Medication Sig Dispense Refill  . albuterol (PROVENTIL HFA;VENTOLIN HFA) 108 (90 BASE) MCG/ACT inhaler Inhale 2 puffs into the lungs every 6 (six) hours as needed for wheezing or shortness of breath. 3 Inhaler 3  . albuterol (PROVENTIL) (2.5 MG/3ML) 0.083% nebulizer solution Take 2.5 mg by nebulization every 6 (six) hours as needed for wheezing or shortness of breath.     [name] amiodarone (PACERONE) 200 MG tablet Take 1 tablet (200 mg total) by mouth daily. 90 tablet 3  . budesonide-formoterol (SYMBICORT) 160-4.5 MCG/ACT inhaler Inhale 2 puffs into the lungs 2 (two) times daily. 1 Inhaler 3  . cholecalciferol (VITAMIN D) 1000 UNITS tablet Take 1 tablet (1,000 Units total) by mouth 2 (two) times daily. 180 tablet 3  . citalopram (CELEXA) 40 MG tablet Take 1 tablet (40 mg total) by mouth at bedtime. 90 tablet 3  . cyanocobalamin 1000 MCG tablet Take 1 tablet (1,000 mcg total) by mouth daily. 90 tablet 3  . docusate sodium (COLACE) 100 MG capsule Take 1-2 capsules (100-200 mg total) by mouth 2 (two) times daily. Take one tab in am and two tabs hs 180 capsule 3  . furosemide (LASIX) 40 MG tablet Take 1 tablet (40 mg total) by mouth daily as needed (fluid retention). 90 tablet 3  . levothyroxine (SYNTHROID, LEVOTHROID) 100 MCG tablet Take 1 tablet (100 mcg total) by mouth at bedtime. 90 tablet 3  . losartan (COZAAR) 50 MG tablet Take 1.5 tablets (75 mg total) by mouth  at bedtime. 135 tablet 3  . Multiple Vitamins-Minerals (MULTIVITAMIN PO) Take 1 tablet by mouth daily.    . nitroGLYCERIN (NITROSTAT) 0.4 MG SL tablet Place 1 tablet (0.4 mg total) under the tongue every 5 (five) minutes as needed for chest pain. 30 tablet 3  . pantoprazole (PROTONIX) 40 MG tablet Take 1 tablet (40 mg total) by mouth 2 (two) times daily. 180 tablet 3  . potassium chloride (K-DUR) 10 MEQ tablet Take 2 tablets (20 mEq total) by mouth daily as needed (when taking Furosemide). 180 tablet 3  . simvastatin (ZOCOR) 80 MG tablet Take 0.5 tablets (40 mg total) by mouth at bedtime. 45 tablet 3  . tiotropium (SPIRIVA) 18 MCG inhalation capsule Place 1 capsule (18 mcg total) into inhaler and inhale daily. 90 capsule 3  . vitamin C (ASCORBIC ACID) 500 MG tablet Take 1 tablet (500 mg total) by mouth 2 (two) times daily. 180 tablet 3  . warfarin (COUMADIN) 5 MG tablet Take 1 tablet (5 mg total) by mouth daily at 6 PM. Daily except none on Fridays, as directed (Patient taking differently: Take 5 mg by mouth daily at 6 PM. Takes 2.5mg  on Fridays and Mondays;  All other days takes  5mg .) 200 tablet 3   No current facility-administered medications for this visit.     Past Medical History  Diagnosis Date  . Ischemic cardiomyopathy  CABG 2003, PCI 2007  EF 27%(myoview 2012  . Chronic systolic heart failure   . Hyperlipidemia   . Hypothyroidism   . Chronic anticoagulation     Afib and LVAD  . Obesity   . COPD (chronic obstructive pulmonary disease)   . Asbestosis(501)     "6 years in the [place]" (05/24/2013)  . Atrial fibrillation     permanent  . Paroxysmal ventricular tachycardia   . Coronary artery disease   . Implantable cardioverter-defibrillator Medtronic   . Hypertension   . Myocardial infarction 1990's-2000    "2 in  ~ the 1990's; 1 in ~ 2000" (05/24/2013)  . OSA on CPAP   . Depression   . LVAD (left ventricular assist device) present 12/2012    ROS:   All systems reviewed  and negative except as noted in the HPI.   Past Surgical History  Procedure Laterality Date  . Coronary artery bypass graft  2003    "?X4" (05/24/2013)  . Cardiac defibrillator placement  2004; ~ 2010  . Insertion of implantable left ventricular assist device N/A 01/12/2013    Procedure: INSERTION OF IMPLANTABLE LEFT VENTRICULAR ASSIST DEVICE;  Surgeon: Ivin Poot, MD;  Location: [place];  Service: Open Heart Surgery;  Laterality: N/A;   nitric oxide; Redo sternotomy  . Intraoperative transesophageal echocardiogram N/A 01/12/2013    Procedure: INTRAOPERATIVE TRANSESOPHAGEAL ECHOCARDIOGRAM;  Surgeon: Ivin Poot, MD;  Location: [place];  Service: Open Heart Surgery;  Laterality: N/A;  . Colonoscopy N/A 03/09/2014    Procedure: COLONOSCOPY;  Surgeon: Inda Castle, MD;  Location: [place];  Service: Endoscopy;  Laterality: N/A;  LVAD  patient  . Esophagogastroduodenoscopy N/A 03/09/2014    Procedure: ESOPHAGOGASTRODUODENOSCOPY (EGD);  Surgeon: Inda Castle, MD;  Location: [place];  Service: Endoscopy;  Laterality: N/A;  . Givens capsule study N/A 03/10/2014    Procedure: GIVENS CAPSULE STUDY;  Surgeon: Inda Castle, MD;  Location: [place];  Service: Endoscopy;  Laterality: N/A;  . Enteroscopy N/A 07/27/2014    Procedure: ENTEROSCOPY;  Surgeon: Gatha Mayer, MD;  Location: [place];  Service: Endoscopy;  Laterality: N/A;  LVAD patient   . Colonoscopy N/A 07/29/2014    Procedure: COLONOSCOPY;  Surgeon: Gatha Mayer, MD;  Location: [place];  Service: Endoscopy;  Laterality: N/A;  . Givens capsule study N/A 07/30/2014    Procedure: GIVENS CAPSULE STUDY;  Surgeon: Gatha Mayer, MD;  Location: [place];  Service: Endoscopy;  Laterality: N/A;  . Esophagogastroduodenoscopy N/A 08/01/2014    Procedure: ESOPHAGOGASTRODUODENOSCOPY (EGD);  Surgeon: Jerene Bears, MD;  [facility] ENDOSCOPY;  Service: Endoscopy;  Laterality: N/A;  LVAD patient  . Left and right  heart catheterization with coronary/graft angiogram  01/07/2013    Procedure: LEFT AND RIGHT HEART CATHETERIZATION WITH Beatrix Fetters;  Surgeon: Jolaine Artist, MD;  Location: [facility] CATH LAB;  Service: Cardiovascular;;  . Enteroscopy N/A 09/27/2014    Procedure: ENTEROSCOPY;  Surgeon: Gatha Mayer, MD;  Location: [facility] ENDOSCOPY;  Service: Endoscopy;  Laterality: N/A;     Family History  Problem Relation Age of Onset  . Heart attack Mother   . Heart attack Father      History   Social History  . Marital Status: Married    Spouse Name: N/A  . Number of Children: N/A  . Years of Education: N/A   Occupational History  . Not on file.   Social History Main Topics  . Smoking status: Former Smoker --  2.00 packs/day for 45 years    Types: Cigarettes    Quit date: 11/11/2001  . Smokeless tobacco: Never Used  . Alcohol Use: No     Comment: 05/24/2013 "use to drink beer; hardly nothing since 2003; once in awhile a beer "  . Drug Use: No  . Sexual Activity: No   Other Topics Concern  . Not on file   Social History Narrative     BP 108/68 mmHg  Pulse 64  Ht 6' (1.829 m)  Wt 227 lb 3.2 oz (103.057 kg)  BMI 30.81 kg/m2  SpO2 91%  Physical Exam:  Well appearing 74 yo man, NAD HEENT: Unremarkable Neck:  No JVD, no thyromegally Back:  No CVA tenderness Lungs:  Clear with no wheezes, rales, or rhonchi, the device has a hole and the sewing sleeve could be seen HEART:  Regular rate rhythm, no murmurs, no rubs, no clicks, high pitched LVAD sounds are present. Abd:  soft, positive bowel sounds, no organomegally, no rebound, no guarding Ext:  2 plus pulses, no edema, no cyanosis, no clubbing Skin:  No rashes no nodules Neuro:  CN II through XII intact, motor grossly intact   DEVICE  Normal device function.  See PaceArt for details.   Assess/Plan:

## 2015-03-01 NOTE — Patient Instructions (Signed)
Medication Instructions:  Your physician recommends that you continue on your current medications as directed. Please refer to the Current Medication list given to you today.   Labwork: NONE  Testing/Procedures: NONE  Follow-Up: You will will be seen in our [facility] in 10-14 days after your scheduled procedure.  Any Other Special Instructions Will Be Listed Below (If Applicable). We are looking to schedule an afternoon extraction on 6/10, 6/14 or 6/16 (mid-morning).  We will get your lab work at the hospital the morning of your procedure.  Once we have it all scheduled we will call you with the details.

## 2015-03-01 NOTE — Assessment & Plan Note (Signed)
I have discussed the situation with the patient and his wife. His ICD pocket is compromised but he does not have any systemic illness. I have recommended removal of his entire ICD system. He does not appear to be dependent on his ICD for pacing and has not gotten a shock. I will plan to stop his coumadin 3 days prior to surgery and bridge with lovenox. Will plan to schedule his device extraction in the next few weeks as schedule allows.

## 2015-03-03 ENCOUNTER — Telehealth: Payer: Self-pay

## 2015-03-03 NOTE — Telephone Encounter (Signed)
Spoke with pt wife she is aware that changes with date of procedure and to expect call from [facility] to setup lab work and preadmission details. She stated she already has gotten call from admitting at the hospital.  Wife, [name], wants to make sure we are aware that the authorization for the procedure from the [place] is in the works and she will get it to [place] as soon as possible and we are NOT to file with [place].  BCBS is secondary insurance and if we file with his BCBS first the [place] will not pay for the claim as they will only be primary not secondary.  Told her I would share information with our billing department as the authorization number from the [place] could possibly come in about the same time or shortly after the procedure is completed since everything is happening so quickly.

## 2015-03-07 ENCOUNTER — Encounter: Payer: Self-pay | Admitting: Licensed Clinical Social Worker

## 2015-03-07 ENCOUNTER — Ambulatory Visit (HOSPITAL_COMMUNITY)
Admission: RE | Admit: 2015-03-07 | Discharge: 2015-03-07 | Disposition: A | Discharge status: Home / Self Care | Payer: Medicare Other | Source: Ambulatory Visit | Attending: Internal Medicine | Admitting: Internal Medicine

## 2015-03-07 ENCOUNTER — Other Ambulatory Visit (HOSPITAL_COMMUNITY): Payer: Self-pay | Admitting: Infectious Diseases

## 2015-03-07 ENCOUNTER — Encounter (HOSPITAL_COMMUNITY): Payer: Self-pay

## 2015-03-07 ENCOUNTER — Ambulatory Visit (HOSPITAL_COMMUNITY): Payer: Self-pay | Admitting: Infectious Diseases

## 2015-03-07 VITALS — BP 100/0 | HR 62 | Ht 72.0 in | Wt 225.4 lb

## 2015-03-07 DIAGNOSIS — Z95810 Presence of automatic (implantable) cardiac defibrillator: Secondary | ICD-10-CM | POA: Insufficient documentation

## 2015-03-07 DIAGNOSIS — I5022 Chronic systolic (congestive) heart failure: Secondary | ICD-10-CM | POA: Diagnosis not present

## 2015-03-07 DIAGNOSIS — Z95811 Presence of heart assist device: Secondary | ICD-10-CM

## 2015-03-07 DIAGNOSIS — K922 Gastrointestinal hemorrhage, unspecified: Secondary | ICD-10-CM | POA: Diagnosis not present

## 2015-03-07 DIAGNOSIS — Z7901 Long term (current) use of anticoagulants: Secondary | ICD-10-CM | POA: Diagnosis not present

## 2015-03-07 DIAGNOSIS — D500 Iron deficiency anemia secondary to blood loss (chronic): Secondary | ICD-10-CM | POA: Diagnosis not present

## 2015-03-07 DIAGNOSIS — R0902 Hypoxemia: Secondary | ICD-10-CM | POA: Insufficient documentation

## 2015-03-07 DIAGNOSIS — I480 Paroxysmal atrial fibrillation: Secondary | ICD-10-CM | POA: Diagnosis not present

## 2015-03-07 DIAGNOSIS — G4733 Obstructive sleep apnea (adult) (pediatric): Secondary | ICD-10-CM | POA: Insufficient documentation

## 2015-03-07 DIAGNOSIS — F329 Major depressive disorder, single episode, unspecified: Secondary | ICD-10-CM | POA: Diagnosis not present

## 2015-03-07 LAB — CBC
HEMATOCRIT: 41.8 % (ref 39.0–52.0)
HEMOGLOBIN: 13 g/dL (ref 13.0–17.0)
MCH: 29.4 pg (ref 26.0–34.0)
MCHC: 31.1 g/dL (ref 30.0–36.0)
MCV: 94.6 fL (ref 78.0–100.0)
Platelets: 153 10*3/uL (ref 150–400)
RBC: 4.42 MIL/uL (ref 4.22–5.81)
RDW: 18.5 % — ABNORMAL HIGH (ref 11.5–15.5)
WBC: 6.8 10*3/uL (ref 4.0–10.5)

## 2015-03-07 LAB — BASIC METABOLIC PANEL
Anion gap: 9 (ref 5–15)
BUN: 19 mg/dL (ref 6–20)
CHLORIDE: 102 mmol/L (ref 101–111)
CO2: 26 mmol/L (ref 22–32)
Calcium: 9.7 mg/dL (ref 8.9–10.3)
Creatinine, Ser: 1.03 mg/dL (ref 0.61–1.24)
GFR calc Af Amer: >60 mL/min (ref >60)
GFR calc non Af Amer: >60 mL/min (ref >60)
GLUCOSE: 107 mg/dL — AB (ref 65–99)
POTASSIUM: 4.3 mmol/L (ref 3.5–5.1)
Sodium: 137 mmol/L (ref 135–145)

## 2015-03-07 LAB — LACTATE DEHYDROGENASE: LDH: 374 U/L — ABNORMAL HIGH (ref 98–192)

## 2015-03-07 LAB — PROTIME-INR
INR: 2.26 — ABNORMAL HIGH (ref 0.00–1.49)
Prothrombin Time: 24.7 seconds — ABNORMAL HIGH (ref 11.6–15.2)

## 2015-03-07 NOTE — Progress Notes (Signed)
Symptom Yes No Details  Angina  x Activity:  Claudication  x How far:  Syncope x  When: having about 2 - 3 a week   Stroke  x   Orthopnea x  How many pillows: 2 stable and for comfort  PND  x How often:  CPAP x  How many hrs: 8 hours/ all night   Pedal edema  x   Abd fullness  x   N&V  x   Diaphoresis x  When: normal for him   Bleeding  x   Urine  x   SOB  x Activity:  Palpitations x  When:   ICD shock  x   Hospitlizaitons  x When/where/why:  ED visit  x When/where/why:  Other MD x  When/who/why:   Activity  x   Fluid  x Drinking Gatorade   Diet  x Adequate and normal diet and appetite    Vital Signs: Doppler BP: 100  Automatic BP: 103/72 (87)   Weight: 226 lbs Last weight: 225.4 lbs HR:  63 SPO2: 90 - 93% on RA  VAD interrogation & Equipment Maintenance: Speed:  9200 Flow:  5.4 Power: 5.  PI: 6.2 Alarms: none   Events: occasional PI events; approx. 2 - 3 daily   Fixed speed: 9200 Low speed limit: 8600 Primary Controller:  Replace back up battery in  __15___  Months. (January 2017) Back up controller:   Replace back up battery in  __15___  Months. (January 2017)  Pt/caregiver deny any alarms or VAD equipment issues. Pt is completing weekly and monthly maintenance for LVAD equipment. Reported that they have re-calibrated all 8 batteries since last visit.   LVAD equipment check completed and is in good working order. Back-up equipment present. LVAD education done on emergency procedures and precautions and reviewed exit site care.  I reviewed the LVAD parameters from today, and compared the results to the patient's prior recorded data.  No programming changes were made.  The LVAD is functioning within specified parameters.  The patient performs LVAD self-test daily.  LVAD interrogation was negative for any significant power changes or alarms and he is having stable PI events/speed drops. LVAD equipment check completed and is in good working order.  Back-up equipment  present.    VAD coordinator reviewed daily log from home for daily temperature, weight, and VAD parameters. Pt is performing daily controller and system monitor self tests along with completing weekly and monthly maintenance for LVAD equipment.   Annual maintenance completed today. Internal battery replaced today LOT# [id number] (Exp: 10/30/17); Patient cable given L/N [id number] (P/N [id number]).    Exit Site:  Drive line exit site well healed and incorporated. The velour is fully implanted at exit site. Dressing dry and intact. No erythema or drainage. Stabilization device present and accurately applied. Driveline dressing is being changed weekly per sterile technique using Sorbaview dressing without biopatch on exit site. Pt denies fever or chills. Pt state they have adequate dressing supplies at home.    Encounter Details: Mr. Deroche is here today for a 2 month follow up visit today with his wife Massie Maroon. He reports that he will be feeling well since we saw him last. Experiences some SOB and weakness associated with some heart palpitations (AFib). Reports that he is still having leg pain as described in previous visit. Unable to complete the 6MW today d/t his leg pain.   EKG on 02/28/15 in [facility] office NSR reported by patient and his wife (report and image  not available).   He will have a repeat CT scan through [place] to reassess lungs as reported by patient's wife this Sunday--requested CT report to be sent to our clinic.   Planning on ICD extraction with Crissie Sickles 03/16/15 d/t infected pocket. Priorauthorization obtained for admission/procedure as reported by patient's wife (she will provide me with the authorization code once she receives it. At this point it was only a verbal from [place]). Planned admission for 03/15/15 with last dose Coumadin on Monday 03/13/15. Dr. Prescott Gum on standby and VAD coordinator will accompany patient in procedure to assist with monitoring under general anesthesia.   Fluid  status elevated today--advised by Dr. Haroldine Laws and Amy to have him take 1 dose of lasix/KCl today and to watch the salt intake.   Pulse Ox showing only 89 - 93% with a good pleth at rest--patient stated that Dr. Aundra Dubin has d/w him in previous encounters about assessing need for home oxygen and he continues to refuse this as an option at this point.   Follow up to be determined after hospital discharge pending ICD removal.   Janene Madeira, RN VAD Coordinator  Office: [phone] 24/7 VAD Pager: [phone]

## 2015-03-07 NOTE — Progress Notes (Signed)
Patient ID: [name], male   DOB: 06/18/1941, 74 y.o.   MRN: [id number]  [facility] [place] (Dr. 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 Quit smoking 2003. Uses CPAP and O2 every night. He is s/p LVAD HM II implanted 01/12/13 under DT criteria  Admitted to [facility] 09/13/13 - 09/14/13 for increased fatigue and dyspnea. Found to have mild CHF and anemia. Diuresed with IV lasix and transitioned to PO lasix 40 mg twice a week. Discharge weight 203 lbs on inpatient scale.  Admitting labs revealed Hgb 7.7 for which he received 2 units PCs with appropriate rise in Hgb.   Admit 6/8-6/12 for symptomatic anemia. Had EGD/Colonoscopy which was unrevealing.  6 mm polyp clipped and removed. CT abdomen showed cirrhosis. Placed on heparin bridge. Capsule study pending. Sent home on lovenox. D/C weight 206 lbs.   Admitted to [facility] with with GI bleed. GI consulted. Push enteroscopy 10/28 negative. Colonoscopy 10/29 without source of bleeding.Result of capsule endoscopy--> Showed bleeding from duodenum. EGD--->Bleeding from duodenum with clip applied. Overall he received  10 UPRBCs. Discharge weight was 207 pounds.   Ad[facility] to [facility] 09/21/14 with GI bleed . GI consulted. Enteroscopy showed bleed from jejunum requiring 3 clips. Overall he received 4 units PRBCs.  He went back in A fib and had DC-CV.  On the day discharge he was back in NSR on amiodarone 200 mg twice a day. Discharge weight was 214 pounds.   He returns for follow up. Saw Dr [name] on 5/31 with nonhealing sore and tethering noted at ICD site. 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 Anxious about ICD extraction next week. Denies SOB/PND/Orthopnea. Weight at home 219-220 pounds. Has not had lasix since April .  Denies BRBPR. Taking all  medications. Using CPAP nightly. He continues weekly dressing changes.   Denies LVAD alarms. Denies driveline trauma, erythema or drainage.  Denies ICD shocks. Reports taking Coumadin as prescribed and adherence to anticoagulation based dietary restrictions. Denies bright red blood per rectum or melena, no dark urine or hematuria.   Labs (2/16): LDL 305, HCT 38.9 Labs (3/16): K 3.9, creatinine 1.05, LFTs normal   Past Medical History  Diagnosis Date  . Ischemic cardiomyopathy      CABG 2003, PCI 2007  EF 27%(myoview 2012  . Chronic systolic heart failure   . Hyperlipidemia   . Hypothyroidism   . Chronic anticoagulation     Afib and LVAD  . Obesity   . COPD (chronic obstructive pulmonary disease)   . Asbestosis(501)     "6 years in the [place]" (05/24/2013)  . Atrial fibrillation     permanent  . Paroxysmal ventricular tachycardia   . Coronary artery disease   . Implantable cardioverter-defibrillator Medtronic   . Hypertension   . Myocardial infarction 1990's-2000    "2 in  ~ the 1990's; 1 in ~ 2000" (05/24/2013)  . OSA on CPAP   . Depression   . LVAD (left ventricular assist device) present 12/2012    Current Outpatient Prescriptions  Medication Sig Dispense Refill  . albuterol (PROVENTIL HFA;VENTOLIN HFA) 108 (90 BASE) MCG/ACT inhaler Inhale 2 puffs into the lungs every 6 (six) hours as needed for wheezing or shortness of breath. 3 Inhaler 3  . albuterol (PROVENTIL) (2.5 MG/3ML) 0.083% nebulizer solution Take 2.5 mg by  nebulization every 6 (six) hours as needed for wheezing or shortness of breath.     [name] amiodarone (PACERONE) 200 MG tablet Take 1 tablet (200 mg total) by mouth daily. 90 tablet 3  . budesonide-formoterol (SYMBICORT) 160-4.5 MCG/ACT inhaler Inhale 2 puffs into the lungs 2 (two) times daily. 1 Inhaler 3  . cholecalciferol (VITAMIN D) 1000 UNITS tablet Take 1 tablet (1,000 Units total) by mouth 2 (two) times daily. 180 tablet 3  . citalopram (CELEXA) 40 MG tablet Take  1 tablet (40 mg total) by mouth at bedtime. 90 tablet 3  . cyanocobalamin 1000 MCG tablet Take 1 tablet (1,000 mcg total) by mouth daily. 90 tablet 3  . docusate sodium (COLACE) 100 MG capsule Take 1-2 capsules (100-200 mg total) by mouth 2 (two) times daily. Take one tab in am and two tabs hs 180 capsule 3  . furosemide (LASIX) 40 MG tablet Take 1 tablet (40 mg total) by mouth daily as needed (fluid retention). 90 tablet 3  . levothyroxine (SYNTHROID, LEVOTHROID) 100 MCG tablet Take 1 tablet (100 mcg total) by mouth at bedtime. 90 tablet 3  . losartan (COZAAR) 50 MG tablet Take 1.5 tablets (75 mg total) by mouth at bedtime. 135 tablet 3  . Multiple Vitamins-Minerals (MULTIVITAMIN PO) Take 1 tablet by mouth daily.    . nitroGLYCERIN (NITROSTAT) 0.4 MG SL tablet Place 1 tablet (0.4 mg total) under the tongue every 5 (five) minutes as needed for chest pain. 30 tablet 3  . pantoprazole (PROTONIX) 40 MG tablet Take 1 tablet (40 mg total) by mouth 2 (two) times daily. 180 tablet 3  . potassium chloride (K-DUR) 10 MEQ tablet Take 2 tablets (20 mEq total) by mouth daily as needed (when taking Furosemide). 180 tablet 3  . simvastatin (ZOCOR) 80 MG tablet Take 0.5 tablets (40 mg total) by mouth at bedtime. 45 tablet 3  . tiotropium (SPIRIVA) 18 MCG inhalation capsule Place 1 capsule (18 mcg total) into inhaler and inhale daily. 90 capsule 3  . vitamin C (ASCORBIC ACID) 500 MG tablet Take 1 tablet (500 mg total) by mouth 2 (two) times daily. 180 tablet 3  . warfarin (COUMADIN) 5 MG tablet Take 1 tablet (5 mg total) by mouth daily at 6 PM. Daily except none on Fridays, as directed (Patient taking differently: Take 5 mg by mouth daily at 6 PM. Takes 2.5mg  on Fridays and Mondays;  All other days takes  5mg .) 200 tablet 3   No current facility-administered medications for this encounter.    Review of patient's allergies indicates no known allergies.  REVIEW OF SYSTEMS: All systems negative except as listed in  HPI, PMH and Problem list.   Filed Vitals:   03/07/15 1022 03/07/15 1027  BP: 107/54 100/0  Pulse: 62   Height: 6' (1.829 m)   Weight: 225 lb 6.4 oz (102.241 kg)   SpO2: 91%     VAD interrogation revealed: Speed: 9200 Flow: 5.4 Power: 5  PI: 6.2 Alarms: none  Events: rare PI events 2-3   Fixed speed:  Low speed limit:  Primary Controller: Replace back up battery in January 2017 Back up controller: Replace back up battery in January 2017  Physical Exam: MAP 100  Mean on Cuff 87  GENERAL: Well appearing, male; NAD; wife present HEENT: normal  NECK: Supple, JVP 7-8 no bruits.  No lymphadenopathy or thyromegaly appreciated.   CARDIAC: Mechanical heart sounds with LVAD hum present. L upper chest with bandaide present.  LUNGS:  Clear to auscultation bilaterally.  ABDOMEN:  Obese, Soft, round, nontender, positive bowel sounds x4.     LVAD exit site: well-healed and incorporated.  Dressing dry and intact.  No erythema, drainage, odor or tenderness.  Stabilization device present and accurately applied.  Driveline dressing is being changed daily per sterile technique. Changed in clinic EXTREMITIES:  Warm and dry, no cyanosis, clubbing, no edema NEUROLOGIC:  Alert and oriented x 4.  Gait steady.  No aphasia.  No dysarthria.  Affect pleasant.     ASSESSMENT AND PLAN:   1) Chronic Systolic HF: s/p LVAD implant 12/2012 for DT. NYHA II symptoms and volume status stable.  - Volume status mildly elevated . Give lasix 40 mg x 1 - Not on BB due to previous bradycardia.  . Continue current losartan.   - Reinforced the need and importance of daily weights, a low sodium diet, and fluid restriction (less than 2 L a day). Instructed to call the HF clinic if weight increases more than 3 lbs overnight or 5 lbs in a week.  2)  Anticoagulation management: INR goal 1.8-2.5 with history of GI bleeding.  He is not on ASA because of bleeding.   Continue coumadin. INR 2.26  See anticoagulation  flow sheet.  3) Anemia/ GIB: Most recent GI bleed 09/21/14. Had bleeding noted in jejunum requiring 3 clips.  Continue PPI and stool softener. No BRBPR.  CBC today Hgb 13.1   4 LVAD: No issues. All parameters within normal limits.  Continue weekly dressing changes. Admit next week for ICD extraction. Stop coumadin the day before.  - Check CBC, BMET, LDH and INR today.   5. PAF: Paroxysmal.  15% burden AF on 2/16 event monitor.  By exam regular rate rhtym.    Continue  amiodarone to 200 mg daily, recent LFTs and TSH normal.  Should have yearly eye exam.   6. Depression: Stable.  Continue Celexa 40 mg daily 7. OSA: Using CPAP.  8. Hypoxemia:  Uses home oxygen at night.  This may be due to his baseline COPD. 9. ICD infeciton - Plans for lead extraction next Wednesday. Stop coumadin next Tuesday. Admit Wednesday and bridge with heparin.    Labs today stable.   Follow up in 2 weeks.   CLEGG,AMY NP-C  03/07/2015   Patient seen and examined with [name], NP. We discussed all aspects of the encounter. I agree with the assessment and plan as stated above.   Doing very well. Volume status just mildly elevated. Ok to give one dose lasix. Discussed upcoming ICD extraction. Agree with plan for management of anti-coagulation. VAD parameters stable.   Bensimhon, Daniel,MD 11:45 AM

## 2015-03-07 NOTE — Patient Instructions (Addendum)
1. INR 2.26 today. For your procedure next week:  Last dose of Coumadin on Monday 03/13/15  Do not take Coumadin on Tuesday 03/14/15  Planning on admission to the hospital on Wednesday 03/15/15. Admissions will call you when a bed is available.  2. Take a dose of lasix with a dose of your potassium for today.   3. No Bojangles :)  4. Back to clinic for follow up 2 months.

## 2015-03-08 NOTE — Pre-Procedure Instructions (Signed)
TRINO HIGINBOTHAM  03/08/2015      [id number] [place], [place] - [address] [facility] [address] [place] [address] Phone: [phone] Fax: [phone]  [facility] [address] [place] - [address] [place] [place] [address] Phone: [phone] Fax: [phone]  [facility] [place], [place]. [address] [place] [address] Phone: [phone] Fax: [phone] [place], [place] - [address] [place] [address] Phone: [phone] Fax: [phone]    Your procedure is scheduled on Thurs, June 16 @ 7:30 AM  Report to [place] at 5:30 AM  Call this number if you have problems the morning of surgery:  [phone]   Remember:  Do not eat food or drink liquids after midnight.  Take these medicines the morning of surgery with A SIP OF WATER:Albuterol<Bring Your Inhaler With You>,Amiodarone(Pacerone),Symbicort(Budesonide),Pantoprazole(Protonix),and Spiriva.              Stop taking your Coumadin as you have been instructed along with any Vitamins or Herbal Medications. No Goody's,BC's,Aleve,Aspirin,Fish Oil,or any Herbal Medications.    Do not wear jewelry.  Do not wear lotions, powders, or colognes.    Men may shave face and neck.  Do not bring valuables to the hospital.  [facility] is not responsible for any belongings or valuables.  Contacts, dentures or bridgework may not be worn into surgery.  Leave your suitcase in the car.  After surgery it may be brought to your room.  For patients admitted to the hospital, discharge time will be determined by your treatment team.  Patients discharged the day of surgery will not be allowed to drive home.    Special instructions:  [place] - Preparing for Surgery  Before surgery, you can play an  important role.  Because skin is not sterile, your skin needs to be as free of germs as possible.  You can reduce the number of germs on you skin by washing with CHG (chlorahexidine gluconate) soap before surgery.  CHG is an antiseptic cleaner which kills germs and bonds with the skin to continue killing germs even after washing.  Please DO NOT use if you have an allergy to CHG or antibacterial soaps.  If your skin becomes reddened/irritated stop using the CHG and inform your nurse when you arrive at Short Stay.  Do not shave (including legs and underarms) for at least 48 hours prior to the first CHG shower.  You may shave your face.  Please follow these instructions carefully:   1.  Shower with CHG Soap the night before surgery and the                                morning of Surgery.  2.  If you choose to wash your hair, wash your hair first as usual with your       normal shampoo.  3.  After you shampoo, rinse your hair and body thoroughly to remove the                      Shampoo.  4.  Use CHG as you would any other liquid soap.  You can apply chg directly       to the skin and wash gently with  scrungie or a clean washcloth.  5.  Apply the CHG Soap to your body ONLY FROM THE NECK DOWN.        Do not use on open wounds or open sores.  Avoid contact with your eyes,       ears, mouth and genitals (private parts).  Wash genitals (private parts)       with your normal soap.  6.  Wash thoroughly, paying special attention to the area where your surgery        will be performed.  7.  Thoroughly rinse your body with warm water from the neck down.  8.  DO NOT shower/wash with your normal soap after using and rinsing off       the CHG Soap.  9.  Pat yourself dry with a clean towel.            10.  Wear clean pajamas.            11.  Place clean sheets on your bed the night of your first shower and do not        sleep with pets.  Day of Surgery  Do not apply any lotions/deoderants the morning of  surgery.  Please wear clean clothes to the hospital/surgery center.    Please read over the following fact sheets that you were given. Pain Booklet, Coughing and Deep Breathing, MRSA Information and Surgical Site Infection Prevention

## 2015-03-08 NOTE — Progress Notes (Signed)
CSW met with patient and wife in the clinic to complete LVAD Reassessment. Patient celebrated his 2nd anniversary of VAD implant. He noted no changes in his demographic information. He states that he is compliant with his medical regimen, medication and appointments. He states he has access to all needed supplies and has no current issues with his care needs. He denies any tobacco, alcohol or illegal substance use. He currently drives although wife is primary driver. He and his wife have discussed goals of care and have all needed advanced directives in place. He resides at home with his wife of 35 years and their dog "Reggie". His niece and sister in law are the back up caregivers and very supportive of both patient and wife. He reports he is independent in self care and ambulation although states that one barrier impacting his care is "don't have any air in my lungs". He states that he gets tired easily. He is involved with [facility] and attends the LVAD Support Group regularly. He enjoys camping and fishing for fun. He denies any financial issues at this time and receives income from [place] and [employer] farm [employer]. He has Medicare and VA medical benefits along with prescription coverage from both.  He states he has been feeling good over the past year and denies any issues with body image. He denies any stress and wife reports "he doesn't have stress - I carry it for both of [place]".  He sleeps 6 hours at night and his appetite is good. He denies any mental health concerns and scored a 7 on the HADS and his wife scored a 56. He states that his life has been improved 100% as a result of receiving the VAD but his biggest concern is that he "can't get away from it- it is always there". He reports he has good support from the team and denied any concerns with team support. Wife stated that the biggest concern about living with the VAD is "I am scared of losing him". She said she has an increased "awareness  of the alarms" due to her concerns and fears. Wife denies any barriers to caregiving and states great support from the VAD Team. CSW encouraged both patient and wife to continue attending the LVAD Support Group as they mentioned how much it helps to relieve stress and have support from other VAD families. CSW will continue to monitor and provide supportive intervention as needed. Raquel Sarna, [place]

## 2015-03-09 ENCOUNTER — Inpatient Hospital Stay (HOSPITAL_COMMUNITY)
Admission: RE | Admit: 2015-03-09 | Discharge: 2015-03-09 | Disposition: A | Discharge status: Home / Self Care | Payer: Non-veteran care | Source: Ambulatory Visit

## 2015-03-09 ENCOUNTER — Other Ambulatory Visit (HOSPITAL_COMMUNITY): Payer: Self-pay | Admitting: Infectious Diseases

## 2015-03-09 DIAGNOSIS — Z862 Personal history of diseases of the blood and blood-forming organs and certain disorders involving the immune mechanism: Secondary | ICD-10-CM

## 2015-03-10 ENCOUNTER — Encounter: Payer: Self-pay | Admitting: Cardiology

## 2015-03-15 ENCOUNTER — Encounter (HOSPITAL_COMMUNITY): Payer: Self-pay | Admitting: *Deleted

## 2015-03-15 ENCOUNTER — Inpatient Hospital Stay (HOSPITAL_COMMUNITY)
Admission: RE | Admit: 2015-03-15 | Discharge: 2015-03-21 | DRG: 261 | Disposition: A | Discharge status: Home / Self Care | Payer: No Typology Code available for payment source | Source: Ambulatory Visit | Attending: Internal Medicine | Admitting: Internal Medicine

## 2015-03-15 DIAGNOSIS — Z95810 Presence of automatic (implantable) cardiac defibrillator: Secondary | ICD-10-CM

## 2015-03-15 DIAGNOSIS — F329 Major depressive disorder, single episode, unspecified: Secondary | ICD-10-CM | POA: Diagnosis present

## 2015-03-15 DIAGNOSIS — I255 Ischemic cardiomyopathy: Secondary | ICD-10-CM | POA: Diagnosis present

## 2015-03-15 DIAGNOSIS — I10 Essential (primary) hypertension: Secondary | ICD-10-CM | POA: Diagnosis present

## 2015-03-15 DIAGNOSIS — Y831 Surgical operation with implant of artificial internal device as the cause of abnormal reaction of the patient, or of later complication, without mention of misadventure at the time of the procedure: Secondary | ICD-10-CM | POA: Diagnosis present

## 2015-03-15 DIAGNOSIS — Z79899 Other long term (current) drug therapy: Secondary | ICD-10-CM | POA: Diagnosis not present

## 2015-03-15 DIAGNOSIS — I252 Old myocardial infarction: Secondary | ICD-10-CM | POA: Diagnosis not present

## 2015-03-15 DIAGNOSIS — I482 Chronic atrial fibrillation: Secondary | ICD-10-CM | POA: Diagnosis present

## 2015-03-15 DIAGNOSIS — E039 Hypothyroidism, unspecified: Secondary | ICD-10-CM | POA: Diagnosis present

## 2015-03-15 DIAGNOSIS — T827XXA Infection and inflammatory reaction due to other cardiac and vascular devices, implants and grafts, initial encounter: Secondary | ICD-10-CM | POA: Diagnosis present

## 2015-03-15 DIAGNOSIS — Z951 Presence of aortocoronary bypass graft: Secondary | ICD-10-CM | POA: Diagnosis not present

## 2015-03-15 DIAGNOSIS — E669 Obesity, unspecified: Secondary | ICD-10-CM | POA: Diagnosis present

## 2015-03-15 DIAGNOSIS — G4733 Obstructive sleep apnea (adult) (pediatric): Secondary | ICD-10-CM | POA: Diagnosis present

## 2015-03-15 DIAGNOSIS — T829XXA Unspecified complication of cardiac and vascular prosthetic device, implant and graft, initial encounter: Secondary | ICD-10-CM | POA: Diagnosis present

## 2015-03-15 DIAGNOSIS — Z6829 Body mass index (BMI) 29.0-29.9, adult: Secondary | ICD-10-CM | POA: Diagnosis not present

## 2015-03-15 DIAGNOSIS — J61 Pneumoconiosis due to asbestos and other mineral fibers: Secondary | ICD-10-CM | POA: Diagnosis present

## 2015-03-15 DIAGNOSIS — I5022 Chronic systolic (congestive) heart failure: Secondary | ICD-10-CM | POA: Diagnosis present

## 2015-03-15 DIAGNOSIS — Z7951 Long term (current) use of inhaled steroids: Secondary | ICD-10-CM | POA: Diagnosis not present

## 2015-03-15 DIAGNOSIS — E785 Hyperlipidemia, unspecified: Secondary | ICD-10-CM | POA: Diagnosis present

## 2015-03-15 DIAGNOSIS — Z9861 Coronary angioplasty status: Secondary | ICD-10-CM | POA: Diagnosis not present

## 2015-03-15 DIAGNOSIS — I2510 Atherosclerotic heart disease of native coronary artery without angina pectoris: Secondary | ICD-10-CM | POA: Diagnosis present

## 2015-03-15 DIAGNOSIS — D649 Anemia, unspecified: Secondary | ICD-10-CM | POA: Diagnosis present

## 2015-03-15 DIAGNOSIS — Z7901 Long term (current) use of anticoagulants: Secondary | ICD-10-CM | POA: Diagnosis not present

## 2015-03-15 DIAGNOSIS — Z95811 Presence of heart assist device: Secondary | ICD-10-CM | POA: Insufficient documentation

## 2015-03-15 DIAGNOSIS — I4891 Unspecified atrial fibrillation: Secondary | ICD-10-CM

## 2015-03-15 DIAGNOSIS — J449 Chronic obstructive pulmonary disease, unspecified: Secondary | ICD-10-CM | POA: Diagnosis present

## 2015-03-15 DIAGNOSIS — Z87891 Personal history of nicotine dependence: Secondary | ICD-10-CM | POA: Diagnosis not present

## 2015-03-15 DIAGNOSIS — I5020 Unspecified systolic (congestive) heart failure: Secondary | ICD-10-CM

## 2015-03-15 LAB — SURGICAL PCR SCREEN
MRSA, PCR: NEGATIVE
Staphylococcus aureus: NEGATIVE

## 2015-03-15 LAB — CBC WITH DIFFERENTIAL/PLATELET
BASOS PCT: 0 % (ref 0–1)
Basophils Absolute: 0 10*3/uL (ref 0.0–0.1)
EOS ABS: 0.2 10*3/uL (ref 0.0–0.7)
Eosinophils Relative: 3 % (ref 0–5)
HCT: 39.9 % (ref 39.0–52.0)
Hemoglobin: 12.4 g/dL — ABNORMAL LOW (ref 13.0–17.0)
LYMPHS ABS: 0.7 10*3/uL (ref 0.7–4.0)
Lymphocytes Relative: 12 % (ref 12–46)
MCH: 29.6 pg (ref 26.0–34.0)
MCHC: 31.1 g/dL (ref 30.0–36.0)
MCV: 95.2 fL (ref 78.0–100.0)
Monocytes Absolute: 0.4 10*3/uL (ref 0.1–1.0)
Monocytes Relative: 7 % (ref 3–12)
NEUTROS PCT: 78 % — AB (ref 43–77)
Neutro Abs: 4.8 10*3/uL (ref 1.7–7.7)
PLATELETS: 122 10*3/uL — AB (ref 150–400)
RBC: 4.19 MIL/uL — ABNORMAL LOW (ref 4.22–5.81)
RDW: 17.9 % — ABNORMAL HIGH (ref 11.5–15.5)
WBC: 6.2 10*3/uL (ref 4.0–10.5)

## 2015-03-15 LAB — PROTIME-INR
INR: 2.33 — ABNORMAL HIGH (ref 0.00–1.49)
Prothrombin Time: 25.3 seconds — ABNORMAL HIGH (ref 11.6–15.2)

## 2015-03-15 LAB — COMPREHENSIVE METABOLIC PANEL
ALBUMIN: 3.4 g/dL — AB (ref 3.5–5.0)
ALT: 29 U/L (ref 17–63)
ANION GAP: 5 (ref 5–15)
AST: 47 U/L — AB (ref 15–41)
Alkaline Phosphatase: 55 U/L (ref 38–126)
BILIRUBIN TOTAL: 0.7 mg/dL (ref 0.3–1.2)
BUN: 13 mg/dL (ref 6–20)
CALCIUM: 9.3 mg/dL (ref 8.9–10.3)
CO2: 26 mmol/L (ref 22–32)
Chloride: 108 mmol/L (ref 101–111)
Creatinine, Ser: 0.86 mg/dL (ref 0.61–1.24)
GFR calc Af Amer: >60 mL/min (ref >60)
Glucose, Bld: 122 mg/dL — ABNORMAL HIGH (ref 65–99)
Potassium: 4 mmol/L (ref 3.5–5.1)
Sodium: 139 mmol/L (ref 135–145)
Total Protein: 5.9 g/dL — ABNORMAL LOW (ref 6.5–8.1)

## 2015-03-15 LAB — LACTATE DEHYDROGENASE: LDH: 316 U/L — ABNORMAL HIGH (ref 98–192)

## 2015-03-15 LAB — MAGNESIUM: Magnesium: 2 mg/dL (ref 1.7–2.4)

## 2015-03-15 MED ORDER — PANTOPRAZOLE SODIUM 40 MG PO TBEC
40.0000 mg | DELAYED_RELEASE_TABLET | Freq: Two times a day (BID) | ORAL | Status: DC
  Administered 2015-03-15 – 2015-03-21 (×11): 40 mg via ORAL
  Filled 2015-03-15 (×11): qty 1

## 2015-03-15 MED ORDER — NITROGLYCERIN 0.4 MG SL SUBL: 0.4000 mg | SUBLINGUAL_TABLET | SUBLINGUAL | Status: DC | PRN

## 2015-03-15 MED ORDER — LEVOTHYROXINE SODIUM 100 MCG PO TABS
100.0000 ug | ORAL_TABLET | Freq: Every day | ORAL | Status: DC
  Administered 2015-03-15 – 2015-03-20 (×6): 100 ug via ORAL
  Filled 2015-03-15 (×8): qty 1

## 2015-03-15 MED ORDER — VITAMIN D3 25 MCG (1000 UNIT) PO TABS
1000.0000 [IU] | ORAL_TABLET | Freq: Two times a day (BID) | ORAL | Status: DC
  Administered 2015-03-15 – 2015-03-21 (×11): 1000 [IU] via ORAL
  Filled 2015-03-15 (×14): qty 1

## 2015-03-15 MED ORDER — DOCUSATE SODIUM 100 MG PO CAPS
100.0000 mg | ORAL_CAPSULE | Freq: Two times a day (BID) | ORAL | Status: DC
  Administered 2015-03-15 – 2015-03-17 (×4): 100 mg via ORAL
  Administered 2015-03-18: 200 mg via ORAL
  Administered 2015-03-18: 100 mg via ORAL
  Administered 2015-03-19: 200 mg via ORAL
  Administered 2015-03-19 – 2015-03-21 (×4): 100 mg via ORAL
  Filled 2015-03-15 (×2): qty 2
  Filled 2015-03-15: qty 1
  Filled 2015-03-15 (×9): qty 2

## 2015-03-15 MED ORDER — VITAMIN B-12 1000 MCG PO TABS
1000.0000 ug | ORAL_TABLET | Freq: Every day | ORAL | Status: DC
  Administered 2015-03-15 – 2015-03-21 (×6): 1000 ug via ORAL
  Filled 2015-03-15 (×8): qty 1

## 2015-03-15 MED ORDER — PHYTONADIONE 1 MG/0.5 ML ORAL SOLUTION
1.0000 mg | Freq: Once | ORAL | Status: AC
  Administered 2015-03-15: 1 mg via ORAL
  Filled 2015-03-15: qty 0.5

## 2015-03-15 MED ORDER — FUROSEMIDE 40 MG PO TABS: 40.0000 mg | ORAL_TABLET | Freq: Every day | ORAL | Status: DC | PRN

## 2015-03-15 MED ORDER — ATORVASTATIN CALCIUM 40 MG PO TABS
40.0000 mg | ORAL_TABLET | Freq: Every day | ORAL | Status: DC
  Administered 2015-03-15 – 2015-03-20 (×6): 40 mg via ORAL
  Filled 2015-03-15 (×8): qty 1

## 2015-03-15 MED ORDER — ALBUTEROL SULFATE (2.5 MG/3ML) 0.083% IN NEBU: 2.5000 mg | INHALATION_SOLUTION | Freq: Four times a day (QID) | RESPIRATORY_TRACT | Status: DC | PRN

## 2015-03-15 MED ORDER — LOSARTAN POTASSIUM 25 MG PO TABS
75.0000 mg | ORAL_TABLET | Freq: Every day | ORAL | Status: DC
  Administered 2015-03-15 – 2015-03-20 (×6): 75 mg via ORAL
  Filled 2015-03-15 (×8): qty 1

## 2015-03-15 MED ORDER — VITAMIN C 500 MG PO TABS
500.0000 mg | ORAL_TABLET | Freq: Two times a day (BID) | ORAL | Status: DC
  Administered 2015-03-15 – 2015-03-21 (×11): 500 mg via ORAL
  Filled 2015-03-15 (×14): qty 1

## 2015-03-15 MED ORDER — CITALOPRAM HYDROBROMIDE 40 MG PO TABS
40.0000 mg | ORAL_TABLET | Freq: Every day | ORAL | Status: DC
  Administered 2015-03-15 – 2015-03-20 (×6): 40 mg via ORAL
  Filled 2015-03-15 (×8): qty 1

## 2015-03-15 MED ORDER — ALBUTEROL SULFATE HFA 108 (90 BASE) MCG/ACT IN AERS: 2.0000 | INHALATION_SPRAY | Freq: Four times a day (QID) | RESPIRATORY_TRACT | Status: DC | PRN

## 2015-03-15 MED ORDER — POTASSIUM CHLORIDE ER 10 MEQ PO TBCR
20.0000 meq | EXTENDED_RELEASE_TABLET | Freq: Every day | ORAL | Status: DC | PRN
  Filled 2015-03-15: qty 2

## 2015-03-15 MED ORDER — AMIODARONE HCL 200 MG PO TABS
200.0000 mg | ORAL_TABLET | Freq: Every day | ORAL | Status: DC
  Administered 2015-03-17 – 2015-03-21 (×5): 200 mg via ORAL
  Filled 2015-03-15 (×7): qty 1

## 2015-03-15 MED ORDER — TIOTROPIUM BROMIDE MONOHYDRATE 18 MCG IN CAPS
18.0000 ug | ORAL_CAPSULE | Freq: Every day | RESPIRATORY_TRACT | Status: DC
  Administered 2015-03-17 – 2015-03-21 (×5): 18 ug via RESPIRATORY_TRACT
  Filled 2015-03-15 (×2): qty 5

## 2015-03-15 MED ORDER — BUDESONIDE-FORMOTEROL FUMARATE 160-4.5 MCG/ACT IN AERO
2.0000 | INHALATION_SPRAY | Freq: Two times a day (BID) | RESPIRATORY_TRACT | Status: DC
  Administered 2015-03-15 – 2015-03-19 (×5): 2 via RESPIRATORY_TRACT
  Filled 2015-03-15 (×2): qty 6

## 2015-03-15 NOTE — Progress Notes (Signed)
Admitted 03/15/15 due to ICD extraction [name]) on 03/16/15.   HeartMate II LVAD implated on 01/12/14 by Dr. Prescott Gum for destination therapy (DT).   Vital signs: HR: 56 Doppler Pressure: 85 Automated BP: not done (but he historically correlates with automated systolic) O2 Sat:  92 - 97% RA Wt:  98.4 kg (216 lb)   LVAD interrogation reveals:  Speed: 9200 Flow: 4.3 Power:  5.1 PI: 6.5 Alarms: none  Events:  Rare PI events  Fixed speed: 9200 Low speed limit: 8600  Drive Line:  Being maintained weekly by his wife [name]--dressing change completed recently for the week per her. C/D/I and NO BIOPATCH is to be used d/t irritation to skin. OK to use Chloraprep sticks.   Labs:  LDH trend: 316  INR trend: 2.3 (Last Dose Coumadin Monday 6/13)  Plan/Recommendations:  Dr. Haroldine Laws d/w Dr. Lovena Le re: INR threshold for the procedure--would like INR < 2.0 ideally 1.5 - 1.8. No need for heparin bridge at this point and he will give 1 mg PO vitamin K.   NPO after midnight for procedure.   Discussed case with anesthesia team today and will accompany patient to OR; planning for general anesthesia and arterial line placement for monitoring. Dr. Prescott Gum on standby for case.

## 2015-03-15 NOTE — Progress Notes (Addendum)
ANTICOAGULATION CONSULT NOTE - Initial Consult  Pharmacy Consult for Heparin when INR < 2 Indication: LVAD  No Known Allergies  Patient Measurements: Height: 6' (182.9 cm) Weight: 216 lb 14.9 oz (98.4 kg) IBW/kg (Calculated) : 77.6 Heparin Dosing Weight: 97.4 kg  Vital Signs: Temp: 98.2 F (36.8 C) (06/15 1218) Temp Source: Oral (06/15 1218) Pulse Rate: 56 (06/15 1100)  Labs:  Recent Labs  03/15/15 1140  HGB 12.4*  HCT 39.9  PLT 122*  LABPROT 25.3*  INR 2.33*  CREATININE 0.86    Estimated Creatinine Clearance: 92.9 mL/min (by C-G formula based on Cr of 0.86).   Medical History: Past Medical History  Diagnosis Date  . Ischemic cardiomyopathy      CABG 2003, PCI 2007  EF 27%(myoview 2012  . Chronic systolic heart failure   . Hyperlipidemia   . Hypothyroidism   . Chronic anticoagulation     Afib and LVAD  . Obesity   . COPD (chronic obstructive pulmonary disease)   . Asbestosis(501)     "6 years in the [place]" (05/24/2013)  . Atrial fibrillation     permanent  . Paroxysmal ventricular tachycardia   . Coronary artery disease   . Implantable cardioverter-defibrillator Medtronic   . Hypertension   . Myocardial infarction 1990's-2000    "2 in  ~ the 1990's; 1 in ~ 2000" (05/24/2013)  . OSA on CPAP   . Depression   . LVAD (left ventricular assist device) present 12/2012    Medications:  Scheduled:  . amiodarone  200 mg Oral Daily  . atorvastatin  40 mg Oral q1800  . budesonide-formoterol  2 puff Inhalation BID  . cholecalciferol  1,000 Units Oral BID  . citalopram  40 mg Oral QHS  . docusate sodium  100-200 mg Oral BID  . levothyroxine  100 mcg Oral QHS  . losartan  75 mg Oral QHS  . pantoprazole  40 mg Oral BID  . tiotropium  18 mcg Inhalation Daily  . cyanocobalamin  1,000 mcg Oral Daily  . vitamin C  500 mg Oral BID    Assessment: 74 yo male with LVAD admitted for scheduled ICD extraction 6/16.  INR today is 2.33, last dose of Coumadin PTA was  6/13.  Pharmacy asked to begin IV heparin when INR < 2.  Giving vitamin K 1 mg orally now.  Goal of Therapy:  Heparin level 0.3-0.7 units/ml Monitor platelets by anticoagulation protocol: Yes   Plan:  1. F/u AM INR, 2. Plan to start IV heparin without bolus once INR < 2. 3. F/u plans for timing of ICD extraction.  Uvaldo Rising, BCPS  Clinical Pharmacist Pager [phone]  03/15/2015 12:55 PM

## 2015-03-15 NOTE — H&P (Signed)
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 Quit smoking 2003. Uses CPAP and O2 every night. He is s/p LVAD HM II implanted 01/12/13 under DT criteria. GI bleed June, October, and December 2015. Had bleeding from duodenum-->clip and jejunum --> 3 clips.   The end of May he was evaluated by Dr Caryl Comes and he had tethering at ICD site. Today he is admitted for scheduled ICD extraction 03/16/15 by Dr Lovena Le. Last dose of coumadin was on 6/13. Remains SOB with exertion but this is his baseline. Denies BRBPR. His wife performs weekly LVAD dressing changes. Weight at home 216-219 pounds. Continues to use CPAP.    LVAD INTERROGATION:  HeartMate II LVAD:  Flow 4.4 liters/min, speed 9200, power 5.2, PI 6.6   Rare PI events noted.    Review of Systems: [y] = yes, [ ]  = no   General: Weight gain [ ] ; Weight loss [ ] ; Anorexia [ ] ; Fatigue [ ] ; Fever [ ] ; Chills [ ] ; Weakness [ ]   Cardiac: Chest pain/pressure [ ] ; Resting SOB [ ] ; Exertional SOB [Y ]; Orthopnea [ ] ; Pedal Edema [ ] ; Palpitations [ ] ; Syncope [ ] ; Presyncope [ ] ; Paroxysmal nocturnal dyspnea[ ]   Pulmonary: Cough [ ] ; Wheezing[ ] ; Hemoptysis[ ] ; Sputum [ ] ; Snoring [ ]   GI: Vomiting[ ] ; Dysphagia[ ] ; Melena[ ] ; Hematochezia [ ] ; Heartburn[ ] ; Abdominal pain [ ] ; Constipation [ ] ; Diarrhea [ ] ; BRBPR [ ]   GU: Hematuria[ ] ; Dysuria [ ] ; Nocturia[ ]   Vascular: Pain in legs with walking [Y ]; Pain in feet with lying flat [ ] ; Non-healing sores [ ] ; Stroke [ ] ; TIA [ ] ; Slurred speech [ ] ;  Neuro: Headaches[ ] ; Vertigo[ ] ; Seizures[ ] ; Paresthesias[ ] ;Blurred vision [ ] ; Diplopia [ ] ; Vision changes [ ]   Ortho/Skin: Arthritis [ ] ; Joint pain [Y ]; Muscle pain [ ] ; Joint swelling [ ] ; Back Pain [ ] ; Rash [ ]   Psych: Depression[ ] ;  Anxiety[ ]   Heme: Bleeding problems [ ] ; Clotting disorders [ ] ; Anemia [ ]   Endocrine: Diabetes [ ] ; Thyroid dysfunction[ ]   Home Medications Prior to Admission medications   Medication Sig Start Date End Date Taking? Authorizing Provider  albuterol (PROVENTIL HFA;VENTOLIN HFA) 108 (90 BASE) MCG/ACT inhaler Inhale 2 puffs into the lungs every 6 (six) hours as needed for wheezing or shortness of breath. 01/10/15  Yes Jolaine Artist, MD  albuterol (PROVENTIL) (2.5 MG/3ML) 0.083% nebulizer solution Take 2.5 mg by nebulization every 6 (six) hours as needed for wheezing or shortness of breath.    Yes Historical Provider, MD  amiodarone (PACERONE) 200 MG tablet Take 1 tablet (200 mg total) by mouth daily. 01/10/15  Yes Jolaine Artist, MD  budesonide-formoterol [facility]) 160-4.5 MCG/ACT inhaler Inhale 2 puffs into the lungs 2 (two) times daily. 01/10/15  Yes Jolaine Artist, MD  cholecalciferol (VITAMIN D) 1000 UNITS tablet Take 1 tablet (1,000 Units total) by mouth 2 (two) times daily. 01/10/15  Yes Jolaine Artist, MD  citalopram (CELEXA) 40 MG tablet Take 1 tablet (40 mg total) by mouth at bedtime. 01/10/15  Yes Jolaine Artist, MD  cyanocobalamin 1000 MCG tablet Take 1 tablet (1,000 mcg total) by mouth daily. 01/10/15  Yes  Jolaine Artist, MD  docusate sodium (COLACE) 100 MG capsule Take 1-2 capsules (100-200 mg total) by mouth 2 (two) times daily. Take one tab in am and two tabs hs Patient taking differently: Take 200 mg by mouth 2 (two) times daily.  01/10/15  Yes Jolaine Artist, MD  furosemide (LASIX) 40 MG tablet Take 1 tablet (40 mg total) by mouth daily as needed (fluid retention). 01/10/15  Yes Jolaine Artist, MD  levothyroxine (SYNTHROID, LEVOTHROID) 100 MCG tablet Take 1 tablet (100 mcg total) by mouth at bedtime. 01/10/15  Yes Jolaine Artist, MD  losartan (COZAAR) 50 MG tablet Take 1.5 tablets (75 mg total) by mouth at bedtime. 01/10/15  Yes Jolaine Artist, MD    Multiple Vitamins-Minerals (MULTIVITAMIN PO) Take 1 tablet by mouth daily.   Yes Historical Provider, MD  nitroGLYCERIN (NITROSTAT) 0.4 MG SL tablet Place 1 tablet (0.4 mg total) under the tongue every 5 (five) minutes as needed for chest pain. 01/10/15  Yes Jolaine Artist, MD  pantoprazole (PROTONIX) 40 MG tablet Take 1 tablet (40 mg total) by mouth 2 (two) times daily. 01/10/15  Yes Jolaine Artist, MD  potassium chloride (K-DUR) 10 MEQ tablet Take 2 tablets (20 mEq total) by mouth daily as needed (when taking Furosemide). 01/10/15  Yes Jolaine Artist, MD  simvastatin (ZOCOR) 80 MG tablet Take 0.5 tablets (40 mg total) by mouth at bedtime. 01/10/15  Yes Jolaine Artist, MD  tiotropium (SPIRIVA) 18 MCG inhalation capsule Place 1 capsule (18 mcg total) into inhaler and inhale daily. 01/10/15  Yes Jolaine Artist, MD  vitamin C (ASCORBIC ACID) 500 MG tablet Take 1 tablet (500 mg total) by mouth 2 (two) times daily. 01/10/15  Yes Jolaine Artist, MD  warfarin (COUMADIN) 5 MG tablet Take 1 tablet (5 mg total) by mouth daily at 6 PM. Daily except none on Fridays, as directed Patient taking differently: Take 5 mg by mouth See admin instructions. Take 2.5mg  tablet by mouth on Fridays and Mondays;  All other days take a 5mg  tablet by mouth. 01/10/15  Yes Jolaine Artist, MD    Past Medical History: Past Medical History  Diagnosis Date  . Ischemic cardiomyopathy      CABG 2003, PCI 2007  EF 27%(myoview 2012  . Chronic systolic heart failure   . Hyperlipidemia   . Hypothyroidism   . Chronic anticoagulation     Afib and LVAD  . Obesity   . COPD (chronic obstructive pulmonary disease)   . Asbestosis(501)     "6 years in the [place]" (05/24/2013)  . Atrial fibrillation     permanent  . Paroxysmal ventricular tachycardia   . Coronary artery disease   . Implantable cardioverter-defibrillator Medtronic   . Hypertension   . Myocardial infarction 1990's-2000    "2 in  ~ the 1990's; 1 in  ~ 2000" (05/24/2013)  . OSA on CPAP   . Depression   . LVAD (left ventricular assist device) present 12/2012    Past Surgical History: Past Surgical History  Procedure Laterality Date  . Coronary artery bypass graft  2003    "?X4" (05/24/2013)  . Cardiac defibrillator placement  2004; ~ 2010  . Insertion of implantable left ventricular assist device N/A 01/12/2013    Procedure: INSERTION OF IMPLANTABLE LEFT VENTRICULAR ASSIST DEVICE;  Surgeon: Ivin Poot, MD;  Location: [place];  Service: Open Heart Surgery;  Laterality: N/A;   nitric oxide; Redo sternotomy  . Intraoperative  transesophageal echocardiogram N/A 01/12/2013    Procedure: INTRAOPERATIVE TRANSESOPHAGEAL ECHOCARDIOGRAM;  Surgeon: Ivin Poot, MD;  Location: [place];  Service: Open Heart Surgery;  Laterality: N/A;  . Colonoscopy N/A 03/09/2014    Procedure: COLONOSCOPY;  Surgeon: Inda Castle, MD;  Location: [place];  Service: Endoscopy;  Laterality: N/A;  LVAD  patient  . Esophagogastroduodenoscopy N/A 03/09/2014    Procedure: ESOPHAGOGASTRODUODENOSCOPY (EGD);  Surgeon: Inda Castle, MD;  Location: [place];  Service: Endoscopy;  Laterality: N/A;  . Givens capsule study N/A 03/10/2014    Procedure: GIVENS CAPSULE STUDY;  Surgeon: Inda Castle, MD;  Location: [place];  Service: Endoscopy;  Laterality: N/A;  . Enteroscopy N/A 07/27/2014    Procedure: ENTEROSCOPY;  Surgeon: Gatha Mayer, MD;  Location: [place];  Service: Endoscopy;  Laterality: N/A;  LVAD patient   . Colonoscopy N/A 07/29/2014    Procedure: COLONOSCOPY;  Surgeon: Gatha Mayer, MD;  Location: [place];  Service: Endoscopy;  Laterality: N/A;  . Givens capsule study N/A 07/30/2014    Procedure: GIVENS CAPSULE STUDY;  Surgeon: Gatha Mayer, MD;  Location: [place];  Service: Endoscopy;  Laterality: N/A;  . Esophagogastroduodenoscopy N/A 08/01/2014    Procedure: ESOPHAGOGASTRODUODENOSCOPY (EGD);  Surgeon: Jerene Bears, MD;   Location: [facility] ENDOSCOPY;  Service: Endoscopy;  Laterality: N/A;  LVAD patient  . Left and right heart catheterization with coronary/graft angiogram  01/07/2013    Procedure: LEFT AND RIGHT HEART CATHETERIZATION WITH Beatrix Fetters;  Surgeon: Jolaine Artist, MD;  Location: [facility] CATH LAB;  Service: Cardiovascular;;  . Enteroscopy N/A 09/27/2014    Procedure: ENTEROSCOPY;  Surgeon: Gatha Mayer, MD;  Location: [facility] ENDOSCOPY;  Service: Endoscopy;  Laterality: N/A;    Family History: Family History  Problem Relation Age of Onset  . Heart attack Mother   . Heart attack Father     Social History: History   Social History  . Marital Status: Married    Spouse Name: N/A  . Number of Children: N/A  . Years of Education: N/A   Social History Main Topics  . Smoking status: Former Smoker -- 2.00 packs/day for 45 years    Types: Cigarettes    Quit date: 11/11/2001  . Smokeless tobacco: Never Used  . Alcohol Use: No     Comment: 05/24/2013 "use to drink beer; hardly nothing since 2003; once in awhile a beer "  . Drug Use: No  . Sexual Activity: No   Other Topics Concern  . None   Social History Narrative    Allergies:  No Known Allergies  Objective:    Vital Signs:   Pulse Rate:  [56] 56 (06/15 1100) Resp:  [21] 21 (06/15 1100) SpO2:  [95 %] 95 % (06/15 1100) Weight:  [216 lb 14.9 oz (98.4 kg)] 216 lb 14.9 oz (98.4 kg) (06/15 1000)   Filed Weights   03/15/15 1000  Weight: 216 lb 14.9 oz (98.4 kg)    Mean arterial Pressure 85   Physical Exam: General:  Well appearing. In bed. No resp difficulty HEENT: normal Neck: supple. JVP 7-8  . Carotids 2+ bilat; no bruits. No lymphadenopathy or thryomegaly appreciated. Cor: Mechanical heart sounds with LVAD hum present. Lungs: clear Abdomen: soft, nontender, nondistended. No hepatosplenomegaly. No bruits or masses. Good bowel sounds. Driveline: C/D/I; securement device intact and driveline incorporated Extremities: no  cyanosis, clubbing, rash, edema Neuro: alert & orientedx3, cranial nerves grossly intact. moves all 4 extremities w/o difficulty. Affect pleasant  Telemetry: NSR 60s   Labs: Basic Metabolic Panel: No results for input(s): NA, K, CL, CO2, GLUCOSE, BUN, CREATININE, CALCIUM, MG, PHOS in the last 168 hours.  Liver Function Tests: No results for input(s): AST, ALT, ALKPHOS, BILITOT, PROT, ALBUMIN in the last 168 hours. No results for input(s): LIPASE, AMYLASE in the last 168 hours. No results for input(s): AMMONIA in the last 168 hours.  CBC: No results for input(s): WBC, NEUTROABS, HGB, HCT, MCV, PLT in the last 168 hours.  Cardiac Enzymes: No results for input(s): CKTOTAL, CKMB, CKMBINDEX, TROPONINI in the last 168 hours.  BNP: BNP (last 3 results)  Recent Labs  12/20/14 1400  BNP 197.6*    ProBNP (last 3 results)  Recent Labs  07/19/14 0830 07/23/14 1410 08/09/14 0953  PROBNP 296.4* 241.4* 478.1*     CBG: No results for input(s): GLUCAP in the last 168 hours.  Coagulation Studies: No results for input(s): LABPROT, INR in the last 72 hours.  Other results:   Imaging:  No results found.      Assessment:  1. ICD Infection 2. 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 Chronic Systolic Heart Failure  4. Chronic Anticoagulation- on coumadin. INR goal 1.8-2.5  5. Anemia /GI Bleed - Most recent 08/2014  6. PAF 7. OSA    Plan/Discussion:   Admitted for ICD extraction on 6/16 by Dr Lovena Le.   With history of GI bleed he is not on aspirin. Last dose of coumadin was 03/12/14. Plan to start heparin once INR less than 2.0 Check INR now. Pharmacy following  From HF perspective he is stable.   Maintaining NSR- continue amiodarone 200 mg daily. Off coumadin as above.   Check Labs now CBC, LDH, BMET, INR.   I reviewed the LVAD parameters from today, and compared the results to the patient's prior recorded data.  No programming changes were made.   The LVAD is functioning within specified parameters.  The patient performs LVAD self-test daily.  LVAD interrogation was negative for any significant power changes, alarms or PI events/speed drops.  LVAD equipment check completed and is in good working order.  Back-up equipment present.   LVAD education done on emergency procedures and precautions and reviewed exit site care.  Length of Stay: 0  CLEGG,AMY 03/15/2015, 11:47 AM  VAD Team Pager [phone] (7am - 7am) +++VAD ISSUES ONLY+++   Advanced Heart Failure Team Pager [phone] (M-F; [place])  Please contact [facility] for night-coverage after hours (4p -7a ) and weekends on [email or web address] for all non- LVAD Issues   Patient seen and examined with [name], NP. We discussed all aspects of the encounter. I agree with the assessment and plan as stated above.   Doing well. Admitted for heparin bridge for ICD extraction. Start heparin (no bolus) when INR < 2.0. VAD parameters stable.   Corin Formisano,MD 12:48 PM

## 2015-03-16 ENCOUNTER — Inpatient Hospital Stay (HOSPITAL_COMMUNITY): Payer: No Typology Code available for payment source | Admitting: Certified Registered"

## 2015-03-16 ENCOUNTER — Encounter (HOSPITAL_COMMUNITY)
Admission: RE | Disposition: A | Discharge status: Home / Self Care | Payer: Self-pay | Source: Ambulatory Visit | Attending: Internal Medicine

## 2015-03-16 ENCOUNTER — Inpatient Hospital Stay (HOSPITAL_COMMUNITY): Payer: No Typology Code available for payment source

## 2015-03-16 DIAGNOSIS — T827XXA Infection and inflammatory reaction due to other cardiac and vascular devices, implants and grafts, initial encounter: Secondary | ICD-10-CM

## 2015-03-16 DIAGNOSIS — T829XXA Unspecified complication of cardiac and vascular prosthetic device, implant and graft, initial encounter: Secondary | ICD-10-CM | POA: Diagnosis present

## 2015-03-16 HISTORY — PX: ICD LEAD REMOVAL: SHX5855

## 2015-03-16 LAB — GLUCOSE, CAPILLARY
GLUCOSE-CAPILLARY: 88 mg/dL (ref 65–99)
GLUCOSE-CAPILLARY: 82 mg/dL (ref 65–99)
GLUCOSE-CAPILLARY: 93 mg/dL (ref 65–99)
Glucose-Capillary: 148 mg/dL — ABNORMAL HIGH (ref 65–99)

## 2015-03-16 LAB — PROTIME-INR
INR: 1.92 — AB (ref 0.00–1.49)
PROTHROMBIN TIME: 21.9 s — AB (ref 11.6–15.2)

## 2015-03-16 LAB — PREPARE RBC (CROSSMATCH)

## 2015-03-16 LAB — LACTATE DEHYDROGENASE: LDH: 313 U/L — ABNORMAL HIGH (ref 98–192)

## 2015-03-16 SURGERY — REMOVAL, ELECTRODE LEAD, ICD
Anesthesia: General | Site: Chest | Laterality: Left

## 2015-03-16 MED ORDER — LIDOCAINE HCL (PF) 1 % IJ SOLN
INTRAMUSCULAR | Status: AC
  Filled 2015-03-16: qty 30

## 2015-03-16 MED ORDER — LIDOCAINE HCL (PF) 1 % IJ SOLN
INTRAMUSCULAR | Status: DC | PRN
  Administered 2015-03-16: 30 mL

## 2015-03-16 MED ORDER — SODIUM CHLORIDE 0.9 % IJ SOLN
INTRAMUSCULAR | Status: AC
  Filled 2015-03-16: qty 10

## 2015-03-16 MED ORDER — FENTANYL CITRATE (PF) 100 MCG/2ML IJ SOLN: 25.0000 ug | INTRAMUSCULAR | Status: DC | PRN

## 2015-03-16 MED ORDER — LACTATED RINGERS IV SOLN
INTRAVENOUS | Status: DC | PRN
  Administered 2015-03-16 (×2): via INTRAVENOUS

## 2015-03-16 MED ORDER — VANCOMYCIN HCL IN DEXTROSE 1-5 GM/200ML-% IV SOLN
1000.0000 mg | Freq: Two times a day (BID) | INTRAVENOUS | Status: AC
  Administered 2015-03-16 – 2015-03-18 (×4): 1000 mg via INTRAVENOUS
  Filled 2015-03-16 (×4): qty 200

## 2015-03-16 MED ORDER — ONDANSETRON HCL 4 MG/2ML IJ SOLN: 4.0000 mg | Freq: Four times a day (QID) | INTRAMUSCULAR | Status: DC | PRN

## 2015-03-16 MED ORDER — DEXTROSE 5 % IV SOLN
1.5000 g | INTRAVENOUS | Status: DC | PRN
  Administered 2015-03-16: 1.5 g via INTRAVENOUS

## 2015-03-16 MED ORDER — PROPOFOL 10 MG/ML IV BOLUS
INTRAVENOUS | Status: DC | PRN
Start: 2015-03-16 — End: 2015-03-16
  Administered 2015-03-16: 60 mg via INTRAVENOUS

## 2015-03-16 MED ORDER — VANCOMYCIN HCL IN DEXTROSE 1-5 GM/200ML-% IV SOLN: 1000.0000 mg | Freq: Once | INTRAVENOUS | Status: DC

## 2015-03-16 MED ORDER — VECURONIUM BROMIDE 10 MG IV SOLR
INTRAVENOUS | Status: AC
  Filled 2015-03-16: qty 10

## 2015-03-16 MED ORDER — SENNOSIDES-DOCUSATE SODIUM 8.6-50 MG PO TABS
1.0000 | ORAL_TABLET | Freq: Every day | ORAL | Status: DC
  Administered 2015-03-16 – 2015-03-20 (×5): 1 via ORAL
  Filled 2015-03-16 (×6): qty 1

## 2015-03-16 MED ORDER — ARTIFICIAL TEARS OP OINT
TOPICAL_OINTMENT | OPHTHALMIC | Status: DC | PRN
  Administered 2015-03-16: 1 via OPHTHALMIC

## 2015-03-16 MED ORDER — MIDAZOLAM HCL 2 MG/2ML IJ SOLN
INTRAMUSCULAR | Status: AC
  Filled 2015-03-16: qty 2

## 2015-03-16 MED ORDER — ACETAMINOPHEN 500 MG PO TABS
1000.0000 mg | ORAL_TABLET | Freq: Four times a day (QID) | ORAL | Status: DC
  Administered 2015-03-16 – 2015-03-18 (×7): 1000 mg via ORAL
  Filled 2015-03-16 (×18): qty 2

## 2015-03-16 MED ORDER — DEXTROSE 5 % IV SOLN
INTRAVENOUS | Status: AC
  Filled 2015-03-16: qty 1.5

## 2015-03-16 MED ORDER — PHENYLEPHRINE HCL 10 MG/ML IJ SOLN
10.0000 mg | INTRAVENOUS | Status: DC | PRN
  Administered 2015-03-16: 10 ug/min via INTRAVENOUS

## 2015-03-16 MED ORDER — TRAMADOL HCL 50 MG PO TABS: 50.0000 mg | ORAL_TABLET | Freq: Four times a day (QID) | ORAL | Status: DC | PRN

## 2015-03-16 MED ORDER — ACETAMINOPHEN 325 MG PO TABS: 325.0000 mg | ORAL_TABLET | ORAL | Status: DC | PRN

## 2015-03-16 MED ORDER — SODIUM CHLORIDE 0.9 % IR SOLN
Freq: Once | Status: AC
  Administered 2015-03-16: 500 mL
  Filled 2015-03-16: qty 2

## 2015-03-16 MED ORDER — PROPOFOL 10 MG/ML IV BOLUS
INTRAVENOUS | Status: AC
  Filled 2015-03-16: qty 20

## 2015-03-16 MED ORDER — INSULIN ASPART 100 UNIT/ML ~~LOC~~ SOLN
0.0000 [IU] | Freq: Three times a day (TID) | SUBCUTANEOUS | Status: DC
  Administered 2015-03-16 – 2015-03-18 (×2): 2 [IU] via SUBCUTANEOUS

## 2015-03-16 MED ORDER — MIDAZOLAM HCL 5 MG/5ML IJ SOLN
INTRAMUSCULAR | Status: DC | PRN
  Administered 2015-03-16 (×2): 1 mg via INTRAVENOUS

## 2015-03-16 MED ORDER — ACETAMINOPHEN 160 MG/5ML PO SOLN
1000.0000 mg | Freq: Four times a day (QID) | ORAL | Status: DC
  Filled 2015-03-16: qty 40

## 2015-03-16 MED ORDER — VANCOMYCIN HCL 1000 MG IV SOLR
1000.0000 mg | INTRAVENOUS | Status: DC | PRN
  Administered 2015-03-16: 1000 mg via INTRAVENOUS

## 2015-03-16 MED ORDER — CETYLPYRIDINIUM CHLORIDE 0.05 % MT LIQD
7.0000 mL | Freq: Two times a day (BID) | OROMUCOSAL | Status: DC
  Administered 2015-03-16 – 2015-03-20 (×5): 7 mL via OROMUCOSAL

## 2015-03-16 MED ORDER — SUCCINYLCHOLINE CHLORIDE 20 MG/ML IJ SOLN
INTRAMUSCULAR | Status: DC | PRN
  Administered 2015-03-16: 140 mg via INTRAVENOUS

## 2015-03-16 MED ORDER — DEXTROSE 5 % IV SOLN
1.5000 g | Freq: Two times a day (BID) | INTRAVENOUS | Status: AC
  Administered 2015-03-16 – 2015-03-17 (×2): 1.5 g via INTRAVENOUS
  Filled 2015-03-16 (×2): qty 1.5

## 2015-03-16 MED ORDER — ONDANSETRON HCL 4 MG/2ML IJ SOLN
INTRAMUSCULAR | Status: DC | PRN
  Administered 2015-03-16: 4 mg via INTRAVENOUS

## 2015-03-16 MED ORDER — LEVALBUTEROL HCL 0.63 MG/3ML IN NEBU: 0.6300 mg | INHALATION_SOLUTION | Freq: Four times a day (QID) | RESPIRATORY_TRACT | Status: DC | PRN

## 2015-03-16 MED ORDER — ONDANSETRON HCL 4 MG/2ML IJ SOLN
INTRAMUSCULAR | Status: AC
  Filled 2015-03-16: qty 2

## 2015-03-16 MED ORDER — POTASSIUM CHLORIDE 10 MEQ/50ML IV SOLN
10.0000 meq | Freq: Every day | INTRAVENOUS | Status: DC | PRN
  Filled 2015-03-16: qty 50

## 2015-03-16 MED ORDER — VECURONIUM BROMIDE 10 MG IV SOLR
INTRAVENOUS | Status: DC | PRN
  Administered 2015-03-16: 3 mg via INTRAVENOUS
  Administered 2015-03-16 (×2): 2 mg via INTRAVENOUS

## 2015-03-16 MED ORDER — NEOSTIGMINE METHYLSULFATE 10 MG/10ML IV SOLN
INTRAVENOUS | Status: AC
  Filled 2015-03-16: qty 1

## 2015-03-16 MED ORDER — BISACODYL 5 MG PO TBEC
10.0000 mg | DELAYED_RELEASE_TABLET | Freq: Every day | ORAL | Status: DC
  Administered 2015-03-19 – 2015-03-21 (×3): 10 mg via ORAL
  Filled 2015-03-16 (×3): qty 2

## 2015-03-16 MED ORDER — VANCOMYCIN HCL IN DEXTROSE 1-5 GM/200ML-% IV SOLN
1000.0000 mg | Freq: Two times a day (BID) | INTRAVENOUS | Status: DC
  Filled 2015-03-16: qty 200

## 2015-03-16 MED ORDER — LIDOCAINE HCL (CARDIAC) 20 MG/ML IV SOLN
INTRAVENOUS | Status: DC | PRN
  Administered 2015-03-16: 60 mg via INTRAVENOUS

## 2015-03-16 MED ORDER — ROCURONIUM BROMIDE 50 MG/5ML IV SOLN
INTRAVENOUS | Status: AC
  Filled 2015-03-16: qty 1

## 2015-03-16 MED ORDER — SODIUM CHLORIDE 0.9 % IR SOLN
Status: DC | PRN
  Administered 2015-03-16: 500 mL

## 2015-03-16 MED ORDER — ARTIFICIAL TEARS OP OINT
TOPICAL_OINTMENT | OPHTHALMIC | Status: AC
  Filled 2015-03-16: qty 3.5

## 2015-03-16 MED ORDER — FENTANYL CITRATE (PF) 100 MCG/2ML IJ SOLN
INTRAMUSCULAR | Status: DC | PRN
  Administered 2015-03-16 (×2): 50 ug via INTRAVENOUS

## 2015-03-16 MED ORDER — LIDOCAINE HCL (CARDIAC) 20 MG/ML IV SOLN
INTRAVENOUS | Status: AC
  Filled 2015-03-16: qty 5

## 2015-03-16 MED ORDER — GLYCOPYRROLATE 0.2 MG/ML IJ SOLN
INTRAMUSCULAR | Status: DC | PRN
  Administered 2015-03-16: 0.6 mg via INTRAVENOUS

## 2015-03-16 MED ORDER — ALBUMIN HUMAN 5 % IV SOLN
INTRAVENOUS | Status: DC | PRN
  Administered 2015-03-16 (×2): via INTRAVENOUS

## 2015-03-16 MED ORDER — NEOSTIGMINE METHYLSULFATE 10 MG/10ML IV SOLN
INTRAVENOUS | Status: DC | PRN
  Administered 2015-03-16: 4 mg via INTRAVENOUS

## 2015-03-16 MED ORDER — GLYCOPYRROLATE 0.2 MG/ML IJ SOLN
INTRAMUSCULAR | Status: AC
  Filled 2015-03-16: qty 3

## 2015-03-16 MED ORDER — FENTANYL CITRATE (PF) 250 MCG/5ML IJ SOLN
INTRAMUSCULAR | Status: AC
  Filled 2015-03-16: qty 5

## 2015-03-16 MED ORDER — ETOMIDATE 2 MG/ML IV SOLN
INTRAVENOUS | Status: DC | PRN
  Administered 2015-03-16: 16 mg via INTRAVENOUS

## 2015-03-16 SURGICAL SUPPLY — 58 items
BAG BANDED W/RUBBER/TAPE 36X54 (MISCELLANEOUS) ×2 IMPLANT
BAG DECANTER FOR FLEXI CONT (MISCELLANEOUS) IMPLANT
BAG EQP BAND 135X91 W/RBR TAPE (MISCELLANEOUS) ×1
BLADE 10 SAFETY STRL DISP (BLADE) ×1 IMPLANT
BLADE MICRO SAGITTAL (BLADE) ×2 IMPLANT
BLADE STERNUM SYSTEM 6 (BLADE) IMPLANT
BLADE SURG ROTATE 9660 (MISCELLANEOUS) IMPLANT
BNDG ADH 5X4 AIR PERM ELC (GAUZE/BANDAGES/DRESSINGS) ×1
BNDG COHESIVE 4X5 WHT NS (GAUZE/BANDAGES/DRESSINGS) ×3 IMPLANT
BRUSH SCRUB EZ PLAIN DRY (MISCELLANEOUS) ×8 IMPLANT
CANISTER SUCTION 2500CC (MISCELLANEOUS) ×3 IMPLANT
COIL ONE TIE COMPRESSION (MISCELLANEOUS) ×2 IMPLANT
COVER DOME SNAP 22 D (MISCELLANEOUS) ×2 IMPLANT
COVER PROBE W GEL 5X96 (DRAPES) ×2 IMPLANT
COVER TABLE BACK 60X90 (DRAPES) ×3 IMPLANT
DRAPE C-ARM 42X72 X-RAY (DRAPES) IMPLANT
DRAPE CARDIOVASCULAR INCISE (DRAPES) ×3
DRAPE INCISE IOBAN 66X45 STRL (DRAPES) IMPLANT
DRAPE PROXIMA HALF (DRAPES) ×6 IMPLANT
DRAPE SRG 135X102X78XABS (DRAPES) ×1 IMPLANT
DRSG TEGADERM 4X4.75 (GAUZE/BANDAGES/DRESSINGS) ×4 IMPLANT
ELECT REM PT RETURN 9FT ADLT (ELECTROSURGICAL) ×6
ELECTRODE REM PT RTRN 9FT ADLT (ELECTROSURGICAL) ×2 IMPLANT
GAUZE PACKING IODOFORM 1 (PACKING) IMPLANT
GAUZE SPONGE 4X4 12PLY STRL (GAUZE/BANDAGES/DRESSINGS) ×1 IMPLANT
GAUZE SPONGE 4X4 16PLY XRAY LF (GAUZE/BANDAGES/DRESSINGS) IMPLANT
GLOVE BIO SURGEON STRL SZ8.5 (GLOVE) ×2 IMPLANT
GLOVE BIOGEL PI IND STRL 6 (GLOVE) IMPLANT
GLOVE BIOGEL PI IND STRL 7.5 (GLOVE) ×1 IMPLANT
GLOVE BIOGEL PI INDICATOR 6 (GLOVE) ×2
GLOVE BIOGEL PI INDICATOR 7.5 (GLOVE) ×2
GLOVE ECLIPSE 8.0 STRL XLNG CF (GLOVE) ×3 IMPLANT
GLOVE SURG SS PI 6.0 STRL IVOR (GLOVE) ×4 IMPLANT
GOWN STRL REUS W/ TWL LRG LVL3 (GOWN DISPOSABLE) ×1 IMPLANT
GOWN STRL REUS W/ TWL XL LVL3 (GOWN DISPOSABLE) ×1 IMPLANT
GOWN STRL REUS W/TWL LRG LVL3 (GOWN DISPOSABLE) ×3
GOWN STRL REUS W/TWL XL LVL3 (GOWN DISPOSABLE) ×3
KIT ROOM TURNOVER OR (KITS) ×3 IMPLANT
NDL PERC 18GX7CM (NEEDLE) IMPLANT
NEEDLE PERC 18GX7CM (NEEDLE) ×3 IMPLANT
NS IRRIG 1000ML POUR BTL (IV SOLUTION) IMPLANT
PAD ARMBOARD 7.5X6 YLW CONV (MISCELLANEOUS) ×6 IMPLANT
PAD ELECT DEFIB RADIOL ZOLL (MISCELLANEOUS) ×3 IMPLANT
SHEATH EVOLUTION RL 11F (SHEATH) ×2 IMPLANT
SHEATH EVOLUTION SHORTE RL 11F (SHEATH) ×2 IMPLANT
SHEATH PINNACLE 6F 10CM (SHEATH) ×2 IMPLANT
SPONGE GAUZE 4X4 12PLY STER LF (GAUZE/BANDAGES/DRESSINGS) ×4 IMPLANT
STYLET LIBERATOR LOCKING (MISCELLANEOUS) ×2 IMPLANT
SUT PROLENE 2 0 CT2 30 (SUTURE) IMPLANT
SUT PROLENE 2 0 SH DA (SUTURE) ×2 IMPLANT
SUT VIC AB 2-0 CT2 18 VCP726D (SUTURE) IMPLANT
SUT VIC AB 3-0 X1 27 (SUTURE) IMPLANT
TOWEL OR 17X24 6PK STRL BLUE (TOWEL DISPOSABLE) ×6 IMPLANT
TOWEL OR 17X26 10 PK STRL BLUE (TOWEL DISPOSABLE) ×6 IMPLANT
TRAY FOLEY CATH 16FRSI W/METER (SET/KITS/TRAYS/PACK) ×3 IMPLANT
TUBE CONNECTING 12'X1/4 (SUCTIONS) ×1
TUBE CONNECTING 12X1/4 (SUCTIONS) ×1 IMPLANT
YANKAUER SUCT BULB TIP NO VENT (SUCTIONS) ×2 IMPLANT

## 2015-03-16 NOTE — Anesthesia Procedure Notes (Addendum)
Procedure Name: Intubation Date/Time: 03/16/2015 8:25 AM Performed by: Willeen Cass P Pre-anesthesia Checklist: Patient identified, Emergency Drugs available, Suction available, Patient being monitored and Timeout performed Patient Re-evaluated:Patient Re-evaluated prior to inductionOxygen Delivery Method: Circle system utilized Preoxygenation: Pre-oxygenation with 100% oxygen Intubation Type: IV induction and Rapid sequence Laryngoscope Size: Mac and 4 Grade View: Grade I Tube type: Oral Tube size: 7.5 mm Number of attempts: 1 Airway Equipment and Method: Stylet Placement Confirmation: ETT inserted through vocal cords under direct vision,  positive ETCO2 and breath sounds checked- equal and bilateral Secured at: 22 cm Tube secured with: Tape Dental Injury: Teeth and Oropharynx as per pre-operative assessment

## 2015-03-16 NOTE — Progress Notes (Signed)
ANTICOAGULATION CONSULT NOTE - Initial Consult  Pharmacy Consult for Heparin when INR < 1.8 Indication: LVAD  No Known Allergies  Patient Measurements: Height: 6' (182.9 cm) Weight: 217 lb 14.4 oz (98.839 kg) IBW/kg (Calculated) : 77.6 Heparin Dosing Weight: 97.4 kg  Vital Signs: Temp: 97.6 F (36.4 C) (06/16 1200) Temp Source: Oral (06/16 1200) BP: 88/53 mmHg (06/16 1300) Pulse Rate: 52 (06/16 1300)  Labs:  Recent Labs  03/15/15 1140 03/16/15 0256  HGB 12.4*  --   HCT 39.9  --   PLT 122*  --   LABPROT 25.3* 21.9*  INR 2.33* 1.92*  CREATININE 0.86  --     Estimated Creatinine Clearance: 93.2 mL/min (by C-G formula based on Cr of 0.86).   Medical History: Past Medical History  Diagnosis Date  . Ischemic cardiomyopathy      CABG 2003, PCI 2007  EF 27%(myoview 2012  . Chronic systolic heart failure   . Hyperlipidemia   . Hypothyroidism   . Chronic anticoagulation     Afib and LVAD  . Obesity   . COPD (chronic obstructive pulmonary disease)   . Asbestosis(501)     "6 years in the [place]" (05/24/2013)  . Atrial fibrillation     permanent  . Paroxysmal ventricular tachycardia   . Coronary artery disease   . Implantable cardioverter-defibrillator Medtronic   . Hypertension   . Myocardial infarction 1990's-2000    "2 in  ~ the 1990's; 1 in ~ 2000" (05/24/2013)  . OSA on CPAP   . Depression   . LVAD (left ventricular assist device) present 12/2012    Medications:  Scheduled:  . acetaminophen  1,000 mg Oral 4 times per day   Or  . acetaminophen (TYLENOL) oral liquid 160 mg/5 mL  1,000 mg Oral 4 times per day  . amiodarone  200 mg Oral Daily  . antiseptic oral rinse  7 mL Mouth Rinse BID  . atorvastatin  40 mg Oral q1800  . bisacodyl  10 mg Oral Daily  . budesonide-formoterol  2 puff Inhalation BID  . cefUROXime (ZINACEF)  IV  1.5 g Intravenous Q12H  . cholecalciferol  1,000 Units Oral BID  . citalopram  40 mg Oral QHS  . cefUROXime (ZINACEF) 1.5 GM IVPB       . docusate sodium  100-200 mg Oral BID  . insulin aspart  0-24 Units Subcutaneous TID AC & HS  . levothyroxine  100 mcg Oral QHS  . losartan  75 mg Oral QHS  . pantoprazole  40 mg Oral BID  . senna-docusate  1 tablet Oral QHS  . tiotropium  18 mcg Inhalation Daily  . vancomycin  1,000 mg Intravenous Q12H  . cyanocobalamin  1,000 mcg Oral Daily  . vitamin C  500 mg Oral BID    Assessment: 74 yo male with LVAD admitted for scheduled ICD extraction 6/16.  INR today is 1.92, last dose of Coumadin PTA was 6/13.  Per Dr. Prescott Gum / VAD coordinator, plan is to resume Coumadin tomorrow, start IV heparin if INR < 1.8 in the AM.  No bleeding or complications noted currently.  Goal of Therapy:  Heparin level 0.3-0.7 units/ml Monitor platelets by anticoagulation protocol: Yes   Plan:  1. F/u AM INR, 2. Plan to start IV heparin without bolus once INR < 1.8. 3. Coumadin to resume tomorrow.  Uvaldo Rising, BCPS  Clinical Pharmacist Pager [phone]  03/16/2015 1:25 PM

## 2015-03-16 NOTE — Anesthesia Preprocedure Evaluation (Signed)
Anesthesia Evaluation  Patient identified by MRN, date of birth, ID band Patient awake    Reviewed: Allergy & Precautions, NPO status , Patient's Chart, lab work & pertinent test results  Airway Mallampati: II   Neck ROM: full    Dental   Pulmonary sleep apnea , COPDformer smoker,  breath sounds clear to auscultation        Cardiovascular hypertension, + CAD, + Past MI and +CHF + Cardiac Defibrillator Rhythm:regular Rate:Normal  LVAD   Neuro/Psych Depression TIA   GI/Hepatic PUD,   Endo/Other  Hypothyroidism   Renal/GU      Musculoskeletal   Abdominal   Peds  Hematology   Anesthesia Other Findings   Reproductive/Obstetrics                             Anesthesia Physical Anesthesia Plan  ASA: IV  Anesthesia Plan: General   Post-op Pain Management:    Induction: Intravenous  Airway Management Planned: Oral ETT  Additional Equipment: Arterial line  Intra-op Plan:   Post-operative Plan: Extubation in OR  Informed Consent: I have reviewed the patients History and Physical, chart, labs and discussed the procedure including the risks, benefits and alternatives for the proposed anesthesia with the patient or authorized representative who has indicated his/her understanding and acceptance.     Plan Discussed with: CRNA, Anesthesiologist and Surgeon  Anesthesia Plan Comments:         Anesthesia Quick Evaluation

## 2015-03-16 NOTE — Transfer of Care (Signed)
Immediate Anesthesia Transfer of Care Note  Patient: [name]  Procedure(s) Performed: Procedure(s) with comments: ICD LEAD REMOVAL/EXTRACTION (Left) - PATIENT HAS LVAD  DR. VAN TRIGT TO BACK UP EXTRACTION  Patient Location: SICU  Anesthesia Type:General  Level of Consciousness: awake, alert , oriented and patient cooperative  Airway & Oxygen Therapy: Patient Spontanous Breathing and Patient connected to nasal cannula oxygen  Post-op Assessment: Report given to RN, Post -op Vital signs reviewed and stable and Patient moving all extremities  Post vital signs: Reviewed and stable  Last Vitals:  Filed Vitals:   03/16/15 0300  Pulse:   Temp: 36.7 C  Resp: 18    Complications: No apparent anesthesia complications

## 2015-03-16 NOTE — Progress Notes (Signed)
HeartMate 2 Rounding Note  Subjective:    Doing well s/p successful ICD extraction for infected pocket.   LVAD INTERROGATION:  HeartMate II LVAD:  Flow 4.5  liters/min, speed 9200, power 5 PI 5.2   Objective:    Vital Signs:   Temp:  [97.6 F (36.4 C)-98.8 F (37.1 C)] 97.6 F (36.4 C) (06/16 1200) Pulse Rate:  [52-70] 52 (06/16 1300) Resp:  [17-24] 17 (06/16 1300) BP: (88-105)/(53-79) 88/53 mmHg (06/16 1300) SpO2:  [85 %-97 %] 96 % (06/16 1300) Arterial Line BP: (78-120)/(67-74) 78/68 mmHg (06/16 1300) Weight:  [217 lb 14.4 oz (98.839 kg)] 217 lb 14.4 oz (98.839 kg) (06/16 0300)   Mean arterial Pressure 83  Intake/Output:   Intake/Output Summary (Last 24 hours) at 03/16/15 1339 Last data filed at 03/16/15 1300  Gross per 24 hour  Intake   1740 ml  Output    650 ml  Net   1090 ml     Physical Exam: General:  Well appearing. No resp difficulty HEENT: normal Neck: supple. JVP . Carotids 2+ bilat; no bruits. No lymphadenopathy or thryomegaly appreciated. Cor: Mechanical heart sounds with LVAD hum present. Dressing over ICD pocket  Lungs: clear Abdomen: soft, nontender, nondistended. No hepatosplenomegaly. No bruits or masses. Good bowel sounds. Driveline: C/D/I; securement device intact and driveline incorporated Extremities: no cyanosis, clubbing, rash, edema Neuro: alert & orientedx3, cranial nerves grossly intact. moves all 4 extremities w/o difficulty. Affect pleasant  Telemetry: NSR  Labs: Basic Metabolic Panel:  Recent Labs Lab 03/15/15 1140  NA 139  K 4.0  CL 108  CO2 26  GLUCOSE 122*  BUN 13  CREATININE 0.86  CALCIUM 9.3  MG 2.0    Liver Function Tests:  Recent Labs Lab 03/15/15 1140  AST 47*  ALT 29  ALKPHOS 55  BILITOT 0.7  PROT 5.9*  ALBUMIN 3.4*   No results for input(s): LIPASE, AMYLASE in the last 168 hours. No results for input(s): AMMONIA in the last 168 hours.  CBC:  Recent Labs Lab 03/15/15 1140  WBC 6.2  NEUTROABS  4.8  HGB 12.4*  HCT 39.9  MCV 95.2  PLT 122*    INR:  Recent Labs Lab 03/15/15 1140 03/16/15 0256  INR 2.33* 1.92*    Other results:  EKG:   Imaging: Dg Chest Port 1 View  03/16/2015   CLINICAL DATA:  Internal cardiac defibrillator procedure.  EXAM: PORTABLE CHEST - 1 VIEW  COMPARISON:  12/20/2014.  FINDINGS: The AICD power pack and leads are no longer present. LEFT ventricular assist device is partially visible. Median sternotomy. CABG markers.  The cardiopericardial silhouette is enlarged. Air is interstitial and mild alveolar opacity compatible with CHF/volume overload. Basilar atelectasis. Faintly visible tubing overlies the RIGHT upper chest. This is probably external to the patient and does not conform to its typical central line.  IMPRESSION: 1. AICD lead removal. 2. Cardiomegaly with interstitial and airspace opacity compatible with moderate CHF. 3. Unchanged LEFT ventricular assist device.   Electronically Signed   By: Dereck Ligas M.D.   On: 03/16/2015 11:37      Medications:     Scheduled Medications: . acetaminophen  1,000 mg Oral 4 times per day   Or  . acetaminophen (TYLENOL) oral liquid 160 mg/5 mL  1,000 mg Oral 4 times per day  . amiodarone  200 mg Oral Daily  . antiseptic oral rinse  7 mL Mouth Rinse BID  . atorvastatin  40 mg Oral q1800  . bisacodyl  10 mg Oral Daily  . budesonide-formoterol  2 puff Inhalation BID  . cefUROXime (ZINACEF)  IV  1.5 g Intravenous Q12H  . cholecalciferol  1,000 Units Oral BID  . citalopram  40 mg Oral QHS  . cefUROXime (ZINACEF) 1.5 GM IVPB      . docusate sodium  100-200 mg Oral BID  . insulin aspart  0-24 Units Subcutaneous TID AC & HS  . levothyroxine  100 mcg Oral QHS  . losartan  75 mg Oral QHS  . pantoprazole  40 mg Oral BID  . senna-docusate  1 tablet Oral QHS  . tiotropium  18 mcg Inhalation Daily  . vancomycin  1,000 mg Intravenous Q12H  . cyanocobalamin  1,000 mcg Oral Daily  . vitamin C  500 mg Oral BID      Infusions:     PRN Medications:  acetaminophen, fentaNYL (SUBLIMAZE) injection, levalbuterol, nitroGLYCERIN, ondansetron (ZOFRAN) IV, ondansetron (ZOFRAN) IV, potassium chloride, traMADol   Assessment:  1. 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 Chronic Systolic Heart Failure  4. Chronic Anticoagulation- on coumadin. INR goal 1.8-2.5  5. Anemia /GI Bleed - Most recent 08/2014  6. PAF 7. OSA   Plan/Discussion:   S/P ICD extraction today.    I reviewed the LVAD parameters from today, and compared the results to the patient's prior recorded data.  No programming changes were made.  The LVAD is functioning within specified parameters.  The patient performs LVAD self-test daily.  LVAD interrogation was negative for any significant power changes, alarms or PI events/speed drops.  LVAD equipment check completed and is in good working order.  Back-up equipment present.   LVAD education done on emergency procedures and precautions and reviewed exit site care.  Length of Stay: 1  CLEGG,AMY 03/16/2015, 1:39 PM  VAD Team --- VAD ISSUES ONLY--- Pager [phone] (7am - 7am)  Advanced Heart Failure Team  Pager [phone] (M-F; 7a - 4p)  Please contact [facility] for night-coverage after hours (4p -7a ) and weekends on [email or web address]   Patient seen and examined with [name], NP. We discussed all aspects of the encounter. I agree with the assessment and plan as stated above.   Doing well after ICD extraction. Possibly home tomorrow if INR 1.8 or greater. If INR drops 1.6 or less can use low dose heparin without bolus. D/w Dr. Lovena Le.   VAD parameters are stable.  Bensimhon, Daniel,MD 6:47 PM

## 2015-03-16 NOTE — Interval H&P Note (Signed)
History and Physical Interval Note:  03/16/2015 6:41 AM  [name]  has presented today for surgery, with the diagnosis of ICD VEGETATION  The various methods of treatment have been discussed with the patient and family. After consideration of risks, benefits and other options for treatment, the patient has consented to  Procedure(s) with comments: ICD LEAD REMOVAL/EXTRACTION (Left) - PATIENT HAS LVAD  DR. VAN TRIGT TO BACK UP EXTRACTION as a surgical intervention .  The patient's history has been reviewed, patient examined, no change in status, stable for surgery.  I have reviewed the patient's chart and labs.  Questions were answered to the patient's satisfaction.     Mikle Bosworth.D.

## 2015-03-16 NOTE — Progress Notes (Signed)
Day of Surgery Procedure(s) (LRB): ICD LEAD REMOVAL/EXTRACTION (Left) Subjective: ICD lead extraction for exposed leads- went w/o complication under Gen anesthesia NSR, TEE is OK  Objective: Vital signs in last 24 hours: Temp:  [98 F (36.7 C)-98.8 F (37.1 C)] 98.1 F (36.7 C) (06/16 0300) Pulse Rate:  [53-67] 64 (06/15 2000) Cardiac Rhythm:  [-] Atrial fibrillation (06/16 0400) Resp:  [17-25] 18 (06/16 0300) SpO2:  [93 %-95 %] 94 % (06/16 0300) Weight:  [216 lb 14.9 oz (98.4 kg)-217 lb 14.4 oz (98.839 kg)] 217 lb 14.4 oz (98.839 kg) (06/16 0300)  Hemodynamic parameters for last 24 hours:  stable  Intake/Output from previous day: 06/15 0701 - 06/16 0700 In: 720 [P.O.:720] Out: 300 [Urine:300] Intake/Output this shift: Total I/O In: 1500 [I.V.:1000; IV Piggyback:500] Out: 180 [Urine:150; Blood:30]  Stable  Admit to SICU Lab Results:  Recent Labs  03/15/15 1140  WBC 6.2  HGB 12.4*  HCT 39.9  PLT 122*   BMET:  Recent Labs  03/15/15 1140  NA 139  K 4.0  CL 108  CO2 26  GLUCOSE 122*  BUN 13  CREATININE 0.86  CALCIUM 9.3    PT/INR:  Recent Labs  03/16/15 0256  LABPROT 21.9*  INR 1.92*   ABG    Component Value Date/Time   PHART 7.472* 01/14/2013 0321   HCO3 25.2* 01/14/2013 0321   TCO2 29 01/14/2013 1837   ACIDBASEDEF 1.0 01/13/2013 1716   O2SAT CREDIT WITH RESULTS 07/26/2014 1200   CBG (last 3)  No results for input(s): GLUCAP in the last 72 hours.  Assessment/Plan: S/P Procedure(s) (LRB): ICD LEAD REMOVAL/EXTRACTION (Left) Continue ABX therapy due to Post-op infection OK to give low dose coumadin this pm but NO LOVENOX, heparin  LOS: 1 day    [name] 03/16/2015

## 2015-03-16 NOTE — CV Procedure (Signed)
Electrophysiology procedure note  Procedure: Extraction of a single chamber ICD secondary to ICD pocket erosion  Preoperative diagnosis: ICD pocket infection  Postoperative diagnosis: Same as preoperative diagnosis  Description of the procedure: After informed consent was obtained, the patient was taken to to the operating room in the fasting state.a service was utilized to provide general anesthesia. The patient had a indwelling left ventricular assist device which was managed during the procedure by the LVAD nurse. A 6 French femoral venous sheath was placed. This was somewhat more difficult than usual because the patient had no arterial pulsation secondary to his left ventricular assist device. 30 cc of lidocaine was infiltrated in the left infraclavicular region. A 7 cm incision was carried out and the eroded area was excised. It was quite thin. Electrocautery was utilized to dissect down to the ICD pocket. The generator was removed with gentle traction. Electrocautery was utilized to free up the dense fibrous adhesions. The lead was disconnected and the stylette was inserted into the body of the lead and the helix retracted. The lead was cut. A liberator locking stylette was inserted into the lead. The proximal portion of the lead was secured with a one tie suture. The [place] RL 38 [place] short dissection sheath was inserted over the lead into the subclavian vein. The sheath was advanced into the innominate vein. The sheath was withdrawn. The longer [place] RL 11 [place] dissection sheath was inserted over the lead and utilizing a combination of traction and countertraction, pressure and counter pressure, the extraction sheath was advanced down to the tip of the RV lead with moderate resistance. The lead was removed in total. There were no hemodynamic sequelae. The pocket was irrigated. Pressure was held and hemostasis obtained. The incision was closed with multiple mattress 2-0 proline sutures. Benzoin and  Steri-Strips her pain on the skin and a pressure dressing placed. The patient was returned to the recovery area in satisfactory condition. Prior this, the 6 French sheath was removed and hemostasis obtained.  Complications: There were no immediate procedure complications.  Conclusion: Successful ICD system extraction in a patient with a left ventricular assist device with no immediate procedure complications.  Cristopher Peru, M.D.

## 2015-03-16 NOTE — OR Nursing (Signed)
Ambrose Pancoast, RN help pressure for 10 minutes on right groin. Reported level 0 after pressure was released.

## 2015-03-16 NOTE — Progress Notes (Signed)
ANTICOAGULATION CONSULT NOTE - Follow-up Consult  Pharmacy Consult for Heparin when INR < 2 Indication: LVAD  No Known Allergies  Patient Measurements: Height: 6' (182.9 cm) Weight: 217 lb 14.4 oz (98.839 kg) IBW/kg (Calculated) : 77.6 Heparin Dosing Weight: 97.4 kg  Vital Signs: Temp: 98.1 F (36.7 C) (06/16 0300) Temp Source: Oral (06/16 0300) Pulse Rate: 64 (06/15 2000)  Labs:  Recent Labs  03/15/15 1140 03/16/15 0256  HGB 12.4*  --   HCT 39.9  --   PLT 122*  --   LABPROT 25.3* 21.9*  INR 2.33* 1.92*  CREATININE 0.86  --     Estimated Creatinine Clearance: 93.2 mL/min (by C-G formula based on Cr of 0.86).   Assessment: 74 yo male with LVAD admitted for scheduled ICD extraction 6/16.  INR down to 1.92 s/p vit K 1mg  yesterday, last dose of Coumadin PTA was 6/13. Plan for heparin bridge once INR <2. Noted that ICD extraction is planned for 0715 and with INR just slightly below goal at 1.92. Will f/u post ICD extraction for starting heparin gtt.  Goal of Therapy:  Heparin level 0.3-0.7 units/ml Monitor platelets by anticoagulation protocol: Yes   Plan:  F/u post ICD extraction for restarting heparin and coumadin  Sherlon Handing, PharmD, BCPS Clinical pharmacist, pager [phone]  03/16/2015 5:47 AM

## 2015-03-16 NOTE — Progress Notes (Signed)
VAD Coordinator Procedure Note:   Patient underwent ICD removal under general anesthesia with Dr. Beckie Salts. Hemodynamics and VAD parameters monitored by me throughout the procedure. MAPs were obtained via left radial arterial line placed by anesthesia.    Flow:RPM:PI:Power:MAP:  Pre-procedure:4.6 1771    6.85.2                    94  Sedation Induction:3.8 9200     6.75.0 96   Interventions: 2 albumins and phenylephrine titration during points of the case   Recovery area:4.2 9200    7.05.486   Patient tolerated the procedure well. Initially had a good pulsatility on arterial line with BP (113/92) and remained pulsatile until PI < 4. PIs remained fairly stable >4.5 until wires were being removed and some blood loss was sustained where he dropped to 2.8-- 2 albumins given and recovered to baseline. Power was stable throughout the case with no elevations. After adequate sedation was achieved, pulse ox had adequate waveform and maintained >92% throughout the remainder of the procedure. MAPs were 65 - 108.   Patient Disposition: Extubated in OR and taken to 2S05 in the care of his nurse Ebony Hail. Report regarding VAD hemodynamics given.   PLAN: No coumadin tonight per d/w Dr. Prescott Gum. INR therapeutic at 1.92 (Patient's goal is 1.8 - 2.5 w/o ASA d/t GI bleeds) so no heparin needed to bridge.   Continue post-operative antibiotics per Dr. Prescott Gum (received 1.5 mg Zinacef and 1g Vancomycin intraop).   Weekly dressing changes to be maintained by the staff--will need to be changed by staff today. No Biopatch d/t irritation, please.

## 2015-03-16 NOTE — H&P (View-Only) (Signed)
HPI [name] is referred today for consideration of ICD system extraction. He is a pleasant 74 yo man with a h/o advanced heart failure, COPD, and ischemic cardiomyopathy, and s/p CABG. The patient has not had any fever or chills. He has developed minimal drainage but thinning and subsequent erosion of the skin around his ICD. His initial device was placed in 2004. He had a generator change approx. 5 years ago. He noted that a scab formed over his device approx. 4 months ago.  No Known Allergies   Current Outpatient Prescriptions  Medication Sig Dispense Refill  . albuterol (PROVENTIL HFA;VENTOLIN HFA) 108 (90 BASE) MCG/ACT inhaler Inhale 2 puffs into the lungs every 6 (six) hours as needed for wheezing or shortness of breath. 3 Inhaler 3  . albuterol (PROVENTIL) (2.5 MG/3ML) 0.083% nebulizer solution Take 2.5 mg by nebulization every 6 (six) hours as needed for wheezing or shortness of breath.     [name] amiodarone (PACERONE) 200 MG tablet Take 1 tablet (200 mg total) by mouth daily. 90 tablet 3  . budesonide-formoterol (SYMBICORT) 160-4.5 MCG/ACT inhaler Inhale 2 puffs into the lungs 2 (two) times daily. 1 Inhaler 3  . cholecalciferol (VITAMIN D) 1000 UNITS tablet Take 1 tablet (1,000 Units total) by mouth 2 (two) times daily. 180 tablet 3  . citalopram (CELEXA) 40 MG tablet Take 1 tablet (40 mg total) by mouth at bedtime. 90 tablet 3  . cyanocobalamin 1000 MCG tablet Take 1 tablet (1,000 mcg total) by mouth daily. 90 tablet 3  . docusate sodium (COLACE) 100 MG capsule Take 1-2 capsules (100-200 mg total) by mouth 2 (two) times daily. Take one tab in am and two tabs hs 180 capsule 3  . furosemide (LASIX) 40 MG tablet Take 1 tablet (40 mg total) by mouth daily as needed (fluid retention). 90 tablet 3  . levothyroxine (SYNTHROID, LEVOTHROID) 100 MCG tablet Take 1 tablet (100 mcg total) by mouth at bedtime. 90 tablet 3  . losartan (COZAAR) 50 MG tablet Take 1.5 tablets (75 mg total) by mouth  at bedtime. 135 tablet 3  . Multiple Vitamins-Minerals (MULTIVITAMIN PO) Take 1 tablet by mouth daily.    . nitroGLYCERIN (NITROSTAT) 0.4 MG SL tablet Place 1 tablet (0.4 mg total) under the tongue every 5 (five) minutes as needed for chest pain. 30 tablet 3  . pantoprazole (PROTONIX) 40 MG tablet Take 1 tablet (40 mg total) by mouth 2 (two) times daily. 180 tablet 3  . potassium chloride (K-DUR) 10 MEQ tablet Take 2 tablets (20 mEq total) by mouth daily as needed (when taking Furosemide). 180 tablet 3  . simvastatin (ZOCOR) 80 MG tablet Take 0.5 tablets (40 mg total) by mouth at bedtime. 45 tablet 3  . tiotropium (SPIRIVA) 18 MCG inhalation capsule Place 1 capsule (18 mcg total) into inhaler and inhale daily. 90 capsule 3  . vitamin C (ASCORBIC ACID) 500 MG tablet Take 1 tablet (500 mg total) by mouth 2 (two) times daily. 180 tablet 3  . warfarin (COUMADIN) 5 MG tablet Take 1 tablet (5 mg total) by mouth daily at 6 PM. Daily except none on Fridays, as directed (Patient taking differently: Take 5 mg by mouth daily at 6 PM. Takes 2.5mg  on Fridays and Mondays;  All other days takes  5mg .) 200 tablet 3   No current facility-administered medications for this visit.     Past Medical History  Diagnosis Date  . Ischemic cardiomyopathy  CABG 2003, PCI 2007  EF 27%(myoview 2012  . Chronic systolic heart failure   . Hyperlipidemia   . Hypothyroidism   . Chronic anticoagulation     Afib and LVAD  . Obesity   . COPD (chronic obstructive pulmonary disease)   . Asbestosis(501)     "6 years in the [place]" (05/24/2013)  . Atrial fibrillation     permanent  . Paroxysmal ventricular tachycardia   . Coronary artery disease   . Implantable cardioverter-defibrillator [employer]onic   . Hypertension   . Myocardial infarction 1990's-2000    "2 in  ~ the 1990's; 1 in ~ 2000" (05/24/2013)  . OSA on CPAP   . Depression   . LVAD (left ventricular assist device) present 12/2012    ROS:   All systems reviewed  and negative except as noted in the HPI.   Past Surgical History  Procedure Laterality Date  . Coronary artery bypass graft  2003    "?X4" (05/24/2013)  . Cardiac defibrillator placement  2004; ~ 2010  . Insertion of implantable left ventricular assist device N/A 01/12/2013    Procedure: INSERTION OF IMPLANTABLE LEFT VENTRICULAR ASSIST DEVICE;  Surgeon: Ivin Poot, MD;  Location: [place];  Service: Open Heart Surgery;  Laterality: N/A;   nitric oxide; Redo sternotomy  . Intraoperative transesophageal echocardiogram N/A 01/12/2013    Procedure: INTRAOPERATIVE TRANSESOPHAGEAL ECHOCARDIOGRAM;  Surgeon: Ivin Poot, MD;  Location: [place];  Service: Open Heart Surgery;  Laterality: N/A;  . Colonoscopy N/A 03/09/2014    Procedure: COLONOSCOPY;  Surgeon: Inda Castle, MD;  Location: [place];  Service: Endoscopy;  Laterality: N/A;  LVAD  patient  . Esophagogastroduodenoscopy N/A 03/09/2014    Procedure: ESOPHAGOGASTRODUODENOSCOPY (EGD);  Surgeon: Inda Castle, MD;  Location: [place];  Service: Endoscopy;  Laterality: N/A;  . Givens capsule study N/A 03/10/2014    Procedure: GIVENS CAPSULE STUDY;  Surgeon: Inda Castle, MD;  Location: [place];  Service: Endoscopy;  Laterality: N/A;  . Enteroscopy N/A 07/27/2014    Procedure: ENTEROSCOPY;  Surgeon: Gatha Mayer, MD;  Location: [place];  Service: Endoscopy;  Laterality: N/A;  LVAD patient   . Colonoscopy N/A 07/29/2014    Procedure: COLONOSCOPY;  Surgeon: Gatha Mayer, MD;  Location: [place];  Service: Endoscopy;  Laterality: N/A;  . Givens capsule study N/A 07/30/2014    Procedure: GIVENS CAPSULE STUDY;  Surgeon: Gatha Mayer, MD;  Location: [place];  Service: Endoscopy;  Laterality: N/A;  . Esophagogastroduodenoscopy N/A 08/01/2014    Procedure: ESOPHAGOGASTRODUODENOSCOPY (EGD);  Surgeon: Jerene Bears, MD;  Location[facility] ENDOSCOPY;  Service: Endoscopy;  Laterality: N/A;  LVAD patient  . Left and right  heart catheterization with coronary/graft angiogram  01/07/2013    Procedure: LEFT AND RIGHT HEART CATHETERIZATION WITH Beatrix Fetters;  Surgeon: Jolaine Artist, MD;  Location[facility] C[facility] LAB;  Service: Cardiovascular;;  . Enteroscopy N/A 09/27/2014    Procedure: ENTEROSCOPY;  Surgeon: Gatha Mayer, MD;  Location: [facility] ENDOSCOPY;  Service: Endoscopy;  Laterality: N/A;     Family History  Problem Relation Age of Onset  . Heart attack Mother   . Heart attack Father      History   Social History  . Marital Status: Married    Spouse Name: N/A  . Number of Children: N/A  . Years of Education: N/A   Occupational History  . Not on file.   Social History Main Topics  . Smoking status: Former Smoker --  2.00 packs/day for 45 years    Types: Cigarettes    Quit date: 11/11/2001  . Smokeless tobacco: Never Used  . Alcohol Use: No     Comment: 05/24/2013 "use to drink beer; hardly nothing since 2003; once in awhile a beer "  . Drug Use: No  . Sexual Activity: No   Other Topics Concern  . Not on file   Social History Narrative     BP 108/68 mmHg  Pulse 64  Ht 6' (1.829 m)  Wt 227 lb 3.2 oz (103.057 kg)  BMI 30.81 kg/m2  SpO2 91%  Physical Exam:  Well appearing 74 yo man, NAD HEENT: Unremarkable Neck:  No JVD, no thyromegally Back:  No CVA tenderness Lungs:  Clear with no wheezes, rales, or rhonchi, the device has a hole and the sewing sleeve could be seen HEART:  Regular rate rhythm, no murmurs, no rubs, no clicks, high pitched LVAD sounds are present. Abd:  soft, positive bowel sounds, no organomegally, no rebound, no guarding Ext:  2 plus pulses, no edema, no cyanosis, no clubbing Skin:  No rashes no nodules Neuro:  CN II through XII intact, motor grossly intact   DEVICE  Normal device function.  See PaceArt for details.   Assess/Plan:

## 2015-03-16 NOTE — Anesthesia Postprocedure Evaluation (Signed)
  Anesthesia Post-op Note  Patient: [name]  Procedure(s) Performed: Procedure(s) with comments: ICD LEAD REMOVAL/EXTRACTION (Left) - PATIENT HAS LVAD  DR. VAN TRIGT TO BACK UP EXTRACTION  Patient Location: SICU  Anesthesia Type:General  Level of Consciousness: awake  Airway and Oxygen Therapy: Patient Spontanous Breathing and Patient connected to nasal cannula oxygen  Post-op Pain: none  Post-op Assessment: Post-op Vital signs reviewed, Patient's Cardiovascular Status Stable and Respiratory Function Stable              Post-op Vital Signs: Reviewed and stable  Last Vitals:  Filed Vitals:   03/16/15 0300  Pulse:   Temp: 36.7 C  Resp: 18    Complications: No apparent anesthesia complications

## 2015-03-16 NOTE — Progress Notes (Addendum)
Admitted 03/15/15 due to ICD extraction [name]) on 03/16/15.   HeartMate II LVAD implated on 01/12/14 by Dr. Prescott Wood for destination therapy (DT).   Vital signs: HR: 64 Doppler Pressure: 90 Automated BP: not done (but he historically correlates with automated systolic) O2 Sat: 21% RA Wt:  98.4 kg (216 lb)   LVAD interrogation reveals:  Speed: 9190 Flow: 4.6 Power:  5.2 PI: 6.8 Alarms: none  Events:  Rare PI events; one last PM Fixed speed: 9200 Low speed limit: 8600  Drive Line:  Being maintained weekly by his wife [name]--dressing change completed recently for the week per her. C/D/I and NO BIOPATCH is to be used d/t irritation to skin. OK to use Chloraprep sticks.   Labs:  LDH trend: 316 > 313  INR trend: 2.3 (Last Dose Coumadin Monday 6/13) 1 mg Vitamin K PO yesterday > 1.92  Plan/Recommendations:  NPO since MN for procedure. INR 1.92 today and LDH stable.   Type and screen, crossmatch for 2 units in preparation for OR d/w Dr. Prescott Wood.   Planning on ICD extraction today and for 2 South bed after surgery to monitor per d/w Dr. Prescott Wood.

## 2015-03-17 ENCOUNTER — Encounter (HOSPITAL_COMMUNITY): Payer: Self-pay | Admitting: Internal Medicine

## 2015-03-17 ENCOUNTER — Encounter: Payer: Self-pay | Admitting: Internal Medicine

## 2015-03-17 DIAGNOSIS — T827XXD Infection and inflammatory reaction due to other cardiac and vascular devices, implants and grafts, subsequent encounter: Secondary | ICD-10-CM

## 2015-03-17 LAB — GLUCOSE, CAPILLARY
Glucose-Capillary: 92 mg/dL (ref 65–99)
Glucose-Capillary: 99 mg/dL (ref 65–99)
Glucose-Capillary: 111 mg/dL — ABNORMAL HIGH (ref 65–99)
Glucose-Capillary: 129 mg/dL — ABNORMAL HIGH (ref 65–99)

## 2015-03-17 LAB — CBC
HCT: 39.4 % (ref 39.0–52.0)
Hemoglobin: 12.1 g/dL — ABNORMAL LOW (ref 13.0–17.0)
MCH: 29.4 pg (ref 26.0–34.0)
MCHC: 30.7 g/dL (ref 30.0–36.0)
MCV: 95.9 fL (ref 78.0–100.0)
Platelets: 112 10*3/uL — ABNORMAL LOW (ref 150–400)
RBC: 4.11 MIL/uL — ABNORMAL LOW (ref 4.22–5.81)
RDW: 17.8 % — ABNORMAL HIGH (ref 11.5–15.5)
WBC: 7.1 10*3/uL (ref 4.0–10.5)

## 2015-03-17 LAB — BASIC METABOLIC PANEL
Anion gap: 6 (ref 5–15)
BUN: 9 mg/dL (ref 6–20)
CO2: 28 mmol/L (ref 22–32)
Calcium: 9.6 mg/dL (ref 8.9–10.3)
Chloride: 105 mmol/L (ref 101–111)
Creatinine, Ser: 0.74 mg/dL (ref 0.61–1.24)
GFR calc Af Amer: >60 mL/min (ref >60)
GFR calc non Af Amer: >60 mL/min (ref >60)
Glucose, Bld: 81 mg/dL (ref 65–99)
Potassium: 4.1 mmol/L (ref 3.5–5.1)
Sodium: 139 mmol/L (ref 135–145)

## 2015-03-17 LAB — PROTIME-INR
INR: 1.37 (ref 0.00–1.49)
Prothrombin Time: 17 seconds — ABNORMAL HIGH (ref 11.6–15.2)

## 2015-03-17 LAB — LACTATE DEHYDROGENASE: LDH: 302 U/L — ABNORMAL HIGH (ref 98–192)

## 2015-03-17 MED ORDER — HEPARIN (PORCINE) IN NACL 100-0.45 UNIT/ML-% IJ SOLN
1350.0000 [IU]/h | INTRAMUSCULAR | Status: DC
  Administered 2015-03-17: 1350 [IU]/h via INTRAVENOUS
  Filled 2015-03-17: qty 250

## 2015-03-17 MED ORDER — WARFARIN SODIUM 7.5 MG PO TABS
7.5000 mg | ORAL_TABLET | Freq: Once | ORAL | Status: AC
  Administered 2015-03-17: 7.5 mg via ORAL
  Filled 2015-03-17: qty 1

## 2015-03-17 MED ORDER — WARFARIN - PHARMACIST DOSING INPATIENT
Freq: Every day | Status: DC
  Administered 2015-03-17: 1
  Administered 2015-03-18 – 2015-03-20 (×2)

## 2015-03-17 MED ORDER — WARFARIN - PHYSICIAN DOSING INPATIENT: Freq: Every day | Status: DC

## 2015-03-17 MED ORDER — WARFARIN SODIUM 2.5 MG PO TABS
2.5000 mg | ORAL_TABLET | Freq: Every day | ORAL | Status: DC
  Filled 2015-03-17: qty 1

## 2015-03-17 NOTE — Care Management Note (Signed)
Case Management Note  Patient Details  Name: SHLOMA ROGGENKAMP MRN: [id number] Date of Birth: Jul 08, 1941  Subjective/Objective:      Admission reported to April Alexander @ [facility], she states that pt is followed by [facility] and any discharge needs should be called to Rosanne Sack @ [phone] x [phone] or Jerlyn Ly @ x [phone] .                      Expected Discharge Plan:  [place]  In-House Referral:     Discharge planning Services  CM Consult  Post Acute Care Choice:    Choice offered to:     DME Arranged:    DME Agency:     HH Arranged:    HH Agency:     Status of Service:  In process, will continue to follow  Medicare Important Message Given:    Date Medicare IM Given:    Medicare IM give by:    Date Additional Medicare IM Given:    Additional Medicare Important Message give by:     If discussed at [place] of Stay Meetings, dates discussed:    Additional Comments:  Girard Cooter, RN 03/17/2015, 1:44 PM

## 2015-03-17 NOTE — Progress Notes (Signed)
ANTICOAGULATION CONSULT NOTE - Follow- Up Consult  Pharmacy Consult for Coumadin (d/c heparin) Indication: LVAD  No Known Allergies  Patient Measurements: Height: 6' (182.9 cm) Weight: 217 lb 9.5 oz (98.7 kg) IBW/kg (Calculated) : 77.6 Heparin Dosing Weight: 97.4 kg  Vital Signs: Temp: 97.6 F (36.4 C) (06/17 1242) Temp Source: Oral (06/17 1242) BP: 90/73 mmHg (06/17 0800) Pulse Rate: 57 (06/17 1300)  Labs:  Recent Labs  03/15/15 1140 03/16/15 0256 03/17/15 0458  HGB 12.4*  --  12.1*  HCT 39.9  --  39.4  PLT 122*  --  112*  LABPROT 25.3* 21.9* 17.0*  INR 2.33* 1.92* 1.37  CREATININE 0.86  --  0.74    Estimated Creatinine Clearance: 100 mL/min (by C-G formula based on Cr of 0.74).   Assessment: 74 yo male with LVAD s/p ICD extraction 6/16.  INR down to 1.37 today.  Per Dr. Prescott Gum / VAD coordinator, plan is to resume Coumadin today and start IV heparin if INR < 1.8.  No bleeding or complications noted currently.  This afternoon, patient developed some bleeding at site of ICD extraction.  Heparin was stopped by Dr. Lovena Le.  Discussed with Dr. Haroldine Laws.  Will plan to not resume IV heparin for now, and will give higher dose of Coumadin tonight.  Goal of Therapy:  INR 1.8-2.5 Monitor platelets by anticoagulation protocol: Yes   Plan:  1. D/c IV heparin  2. Coumadin 7.5 mg x 1 tonight. 3. Daily PT/INR.  Uvaldo Rising, BCPS  Clinical Pharmacist Pager [phone]  03/17/2015 2:01 PM

## 2015-03-17 NOTE — Progress Notes (Signed)
ANTICOAGULATION CONSULT NOTE - Initial Consult  Pharmacy Consult for Heparin when INR < 1.8 Indication: LVAD  No Known Allergies  Patient Measurements: Height: 6' (182.9 cm) Weight: 217 lb 14.4 oz (98.839 kg) IBW/kg (Calculated) : 77.6 Heparin Dosing Weight: 97.4 kg  Vital Signs: Temp: 97.7 F (36.5 C) (06/17 0400) Temp Source: Oral (06/17 0400) BP: 77/49 mmHg (06/17 0500) Pulse Rate: 49 (06/17 0500)  Labs:  Recent Labs  03/15/15 1140 03/16/15 0256 03/17/15 0458  HGB 12.4*  --   --   HCT 39.9  --   --   PLT 122*  --   --   LABPROT 25.3* 21.9* 17.0*  INR 2.33* 1.92* 1.37  CREATININE 0.86  --   --     Estimated Creatinine Clearance: 93.2 mL/min (by C-G formula based on Cr of 0.86).   Assessment: 74 yo male with LVAD s/p ICD extraction 6/16.  INR down to 1.37 today.  Per Dr. Prescott Gum / VAD coordinator, plan is to resume Coumadin today and start IV heparin if INR < 1.8.  No bleeding or complications noted currently.  Goal of Therapy:  Heparin level 0.3-0.5 (low dose per Dr. Haroldine Laws note 6/16) Monitor platelets by anticoagulation protocol: Yes   Plan:  IV heparin gtt at 1350 units/hr. No bolus. F/u 8 hr heparin level Will f/u daily heparin level and CBC  Sherlon Handing, PharmD, BCPS Clinical pharmacist, pager [phone]  03/17/2015 6:03 AM

## 2015-03-17 NOTE — Progress Notes (Addendum)
Doing well s/p ICD system extraction Pressure dressing removed this morning 4x4 and Tegaderm applied.  If hematoma or recurrent bleeding on Heparin, can replace pressure dressing Will need follow up in 7-10 days in device clinic for suture removal (I will arrange)  EP to see as needed while here. Please call with questions.  Chanetta Marshall, NP 03/17/2015 8:38 AM  EP Attending  Patient seen and examined. Agree with above.   Mikle Bosworth.D.

## 2015-03-17 NOTE — Progress Notes (Signed)
HeartMate 2 Rounding Note  Subjective:    Having no troubles s/p ICD extraction for infected pocket. Wants a more comfortable room, with a phone.   LVAD INTERROGATION:  HeartMate II LVAD:  Flow 5.0 liters/min, speed 9200, power 5.4, PI 5.2.  2 PI events today, 6/17. 4 PI events yesterday, 03/16/15  Objective:    Vital Signs:   Temp:  [97.4 F (36.3 C)-98.2 F (36.8 C)] 97.6 F (36.4 C) (06/17 1242) Pulse Rate:  [31-65] 55 (06/17 0700) Resp:  [15-22] 21 (06/17 0700) BP: (70-105)/(49-87) 98/77 mmHg (06/17 0700) SpO2:  [92 %-99 %] 95 % (06/17 0700) Arterial Line BP: (63-102)/(56-73) 80/69 mmHg (06/17 0700) Weight:  [217 lb 9.5 oz (98.7 kg)] 217 lb 9.5 oz (98.7 kg) (06/17 0500)   Mean arterial Pressure: 70 Intake/Output:   Intake/Output Summary (Last 24 hours) at 03/17/15 1318 Last data filed at 03/17/15 6237  Gross per 24 hour  Intake 256.08 ml  Output   1880 ml  Net -1623.92 ml     Physical Exam: General:  Well appearing. No resp difficulty HEENT: normal Neck: supple. JVP . Carotids 2+ bilat; no bruits. No lymphadenopathy or thryomegaly appreciated. Cor: Mechanical heart sounds with LVAD hum present. Pacer wound dressing recently redressed. C/d/i Lungs: clear Abdomen: soft, nontender, nondistended. No hepatosplenomegaly. No bruits or masses. Good bowel sounds. Driveline: C/D/I; securement device intact and driveline incorporated Extremities: no cyanosis, clubbing, rash, edema Neuro: alert & orientedx3, cranial nerves grossly intact. moves all 4 extremities w/o difficulty. Affect pleasant  Telemetry: NSR  Labs: Basic Metabolic Panel:  Recent Labs Lab 03/15/15 1140 03/17/15 0458  NA 139 139  K 4.0 4.1  CL 108 105  CO2 26 28  GLUCOSE 122* 81  BUN 13 9  CREATININE 0.86 0.74  CALCIUM 9.3 9.6  MG 2.0  --     Liver Function Tests:  Recent Labs Lab 03/15/15 1140  AST 47*  ALT 29  ALKPHOS 55  BILITOT 0.7  PROT 5.9*  ALBUMIN 3.4*   No results for  input(s): LIPASE, AMYLASE in the last 168 hours. No results for input(s): AMMONIA in the last 168 hours.  CBC:  Recent Labs Lab 03/15/15 1140 03/17/15 0458  WBC 6.2 7.1  NEUTROABS 4.8  --   HGB 12.4* 12.1*  HCT 39.9 39.4  MCV 95.2 95.9  PLT 122* 112*    INR:  Recent Labs Lab 03/15/15 1140 03/16/15 0256 03/17/15 0458  INR 2.33* 1.92* 1.37    Other results:  EKG:   Imaging: Dg Chest Port 1 View  03/16/2015   CLINICAL DATA:  Internal cardiac defibrillator procedure.  EXAM: PORTABLE CHEST - 1 VIEW  COMPARISON:  12/20/2014.  FINDINGS: The AICD power pack and leads are no longer present. LEFT ventricular assist device is partially visible. Median sternotomy. CABG markers.  The cardiopericardial silhouette is enlarged. Air is interstitial and mild alveolar opacity compatible with CHF/volume overload. Basilar atelectasis. Faintly visible tubing overlies the RIGHT upper chest. This is probably external to the patient and does not conform to its typical central line.  IMPRESSION: 1. AICD lead removal. 2. Cardiomegaly with interstitial and airspace opacity compatible with moderate CHF. 3. Unchanged LEFT ventricular assist device.   Electronically Signed   By: Dereck Ligas M.D.   On: 03/16/2015 11:37      Medications:     Scheduled Medications: . acetaminophen  1,000 mg Oral 4 times per day   Or  . acetaminophen (TYLENOL) oral liquid 160 mg/5 mL  1,000 mg Oral 4 times per day  . amiodarone  200 mg Oral Daily  . antiseptic oral rinse  7 mL Mouth Rinse BID  . atorvastatin  40 mg Oral q1800  . bisacodyl  10 mg Oral Daily  . budesonide-formoterol  2 puff Inhalation BID  . cholecalciferol  1,000 Units Oral BID  . citalopram  40 mg Oral QHS  . docusate sodium  100-200 mg Oral BID  . insulin aspart  0-24 Units Subcutaneous TID AC & HS  . levothyroxine  100 mcg Oral QHS  . losartan  75 mg Oral QHS  . pantoprazole  40 mg Oral BID  . senna-docusate  1 tablet Oral QHS  .  tiotropium  18 mcg Inhalation Daily  . vancomycin  1,000 mg Intravenous Q12H  . cyanocobalamin  1,000 mcg Oral Daily  . vitamin C  500 mg Oral BID  . warfarin  2.5 mg Oral q1800  . Warfarin - Physician Dosing Inpatient   Does not apply q1800     Infusions:     PRN Medications:  fentaNYL (SUBLIMAZE) injection, levalbuterol, nitroGLYCERIN, ondansetron (ZOFRAN) IV, ondansetron (ZOFRAN) IV, potassium chloride, traMADol   Assessment/Plan  1. 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 Chronic Systolic Heart Failure  4. Chronic Anticoagulation- on coumadin. INR goal 1.8-2.5  5. Anemia /GI Bleed - Most recent 08/2014  6. PAF 7. OSA  Doing fine s/p ICD extraction and being held for non-therapeutic INR. INR was 1.36 this morning and plan is to resume Coumadin this evening.  He had some bleeding at his surgical site on heparin infusion this am, so it was dc'd.  Dosage was 13.5 ml/hr which may be too high for him.  Consider starting him back at lower dose for bridging.  Will consult pharm and MD. Consider transfer to lower acuity room.   I reviewed the LVAD parameters from today, and compared the results to the patient's prior recorded data.  No programming changes were made.  The LVAD is functioning within specified parameters.  The patient performs LVAD self-test daily.  LVAD interrogation was negative for any significant power changes, alarms or PI events/speed drops.  LVAD equipment check completed and is in good working order.  Back-up equipment present.   LVAD education done on emergency procedures and precautions and reviewed exit site care.  Length of Stay: 2  Shirley Friar 03/17/2015, 1:18 PM  VAD Team --- VAD ISSUES ONLY--- Pager [phone] (7am - 7am)  Advanced Heart Failure Team  Pager [phone] (M-F; 7a - 4p)  Please contact [facility] for night-coverage after hours (4p -7a ) and weekends on  [email or web address]  Doing well s/p ICD extraction. Had some bleeding from pocket. Heparin stopped. Continue coumadin. MAP soft. Hold losartan for MAP < 70. Transfer to [employer] bed.   Bensimhon, Daniel,MD 2:41 PM

## 2015-03-17 NOTE — Progress Notes (Signed)
Patient ID: SLOANE JUNKIN, male   DOB: June 28, 1941, 74 y.o.   MRN: [id number]    Patient Name: [name] Date of Encounter: 03/17/2015     Active Problems:   ICD (implantable cardioverter-defibrillator) infection   Pacemaker complications    SUBJECTIVE  Denies incisional pain this morning.  CURRENT MEDS . acetaminophen  1,000 mg Oral 4 times per day   Or  . acetaminophen (TYLENOL) oral liquid 160 mg/5 mL  1,000 mg Oral 4 times per day  . amiodarone  200 mg Oral Daily  . antiseptic oral rinse  7 mL Mouth Rinse BID  . atorvastatin  40 mg Oral q1800  . bisacodyl  10 mg Oral Daily  . budesonide-formoterol  2 puff Inhalation BID  . cefUROXime (ZINACEF)  IV  1.5 g Intravenous Q12H  . cholecalciferol  1,000 Units Oral BID  . citalopram  40 mg Oral QHS  . docusate sodium  100-200 mg Oral BID  . insulin aspart  0-24 Units Subcutaneous TID AC & HS  . levothyroxine  100 mcg Oral QHS  . losartan  75 mg Oral QHS  . pantoprazole  40 mg Oral BID  . senna-docusate  1 tablet Oral QHS  . tiotropium  18 mcg Inhalation Daily  . vancomycin  1,000 mg Intravenous Q12H  . cyanocobalamin  1,000 mcg Oral Daily  . vitamin C  500 mg Oral BID    OBJECTIVE  Filed Vitals:   03/17/15 0500 03/17/15 0600 03/17/15 0700 03/17/15 0814  BP: 77/49 71/58 98/77    Pulse: 49 50 55   Temp:    97.4 F (36.3 C)  TempSrc:    Oral  Resp: 17 17 21    Height:      Weight: 217 lb 9.5 oz (98.7 kg)     SpO2: 96% 98% 95%     Intake/Output Summary (Last 24 hours) at 03/17/15 0837 Last data filed at 03/17/15 5284  Gross per 24 hour  Intake 1756.08 ml  Output   2190 ml  Net -433.92 ml   Filed Weights   03/15/15 1000 03/16/15 0300 03/17/15 0500  Weight: 216 lb 14.9 oz (98.4 kg) 217 lb 14.4 oz (98.839 kg) 217 lb 9.5 oz (98.7 kg)    PHYSICAL EXAM  General: Pleasant, NAD. Neuro: Alert and oriented X 3. Moves all extremities spontaneously. Psych: Normal affect. HEENT:  Normal  Neck: Supple without  bruits or JVD. Lungs:  Resp regular and unlabored, CTA. Heart: high frequency hum from LVAD Abdomen: Soft, non-tender, non-distended, BS + x 4.  Extremities: No clubbing, cyanosis or edema. DP/PT/Radials 2+ and equal bilaterally.  Accessory Clinical Findings  CBC  Recent Labs  03/15/15 1140 03/17/15 0458  WBC 6.2 7.1  NEUTROABS 4.8  --   HGB 12.4* 12.1*  HCT 39.9 39.4  MCV 95.2 95.9  PLT 122* 132*   Basic Metabolic Panel  Recent Labs  03/15/15 1140 03/17/15 0458  NA 139 139  K 4.0 4.1  CL 108 105  CO2 26 28  GLUCOSE 122* 81  BUN 13 9  CREATININE 0.86 0.74  CALCIUM 9.3 9.6  MG 2.0  --    Liver Function Tests  Recent Labs  03/15/15 1140  AST 47*  ALT 29  ALKPHOS 55  BILITOT 0.7  PROT 5.9*  ALBUMIN 3.4*   No results for input(s): LIPASE, AMYLASE in the last 72 hours. Cardiac Enzymes No results for input(s): CKTOTAL, CKMB, CKMBINDEX, TROPONINI in the last 72 hours. BNP Invalid  input(s): POCBNP D-Dimer No results for input(s): DDIMER in the last 72 hours. Hemoglobin A1C No results for input(s): HGBA1C in the last 72 hours. Fasting Lipid Panel No results for input(s): CHOL, HDL, LDLCALC, TRIG, CHOLHDL, LDLDIRECT in the last 72 hours. Thyroid Function Tests No results for input(s): TSH, T4TOTAL, T3FREE, THYROIDAB in the last 72 hours.  Invalid input(s): FREET3  TELE  Nsr/sinus brady  Radiology/Studies  Dg Chest Port 1 View  03/16/2015   CLINICAL DATA:  Internal cardiac defibrillator procedure.  EXAM: PORTABLE CHEST - 1 VIEW  COMPARISON:  12/20/2014.  FINDINGS: The AICD power pack and leads are no longer present. LEFT ventricular assist device is partially visible. Median sternotomy. CABG markers.  The cardiopericardial silhouette is enlarged. Air is interstitial and mild alveolar opacity compatible with CHF/volume overload. Basilar atelectasis. Faintly visible tubing overlies the RIGHT upper chest. This is probably external to the patient and does not  conform to its typical central line.  IMPRESSION: 1. AICD lead removal. 2. Cardiomegaly with interstitial and airspace opacity compatible with moderate CHF. 3. Unchanged LEFT ventricular assist device.   Electronically Signed   By: Dereck Ligas M.D.   On: 03/16/2015 11:37    ASSESSMENT AND PLAN  1. 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 S/p ICD system infection 3. coags - his INR is 1.3 today and IV heparin was restarted without bolus. Would like heparin level 0.2-0.3. Stop heparin once coumadin level above 2.  Gregg Taylor,M.D.  03/17/2015 8:37 AM

## 2015-03-17 NOTE — Progress Notes (Signed)
Utilization Review Completed.  

## 2015-03-18 LAB — CBC
HCT: 38.4 % — ABNORMAL LOW (ref 39.0–52.0)
Hemoglobin: 11.9 g/dL — ABNORMAL LOW (ref 13.0–17.0)
MCH: 29.7 pg (ref 26.0–34.0)
MCHC: 31 g/dL (ref 30.0–36.0)
MCV: 95.8 fL (ref 78.0–100.0)
Platelets: 115 K/uL — ABNORMAL LOW (ref 150–400)
RBC: 4.01 MIL/uL — ABNORMAL LOW (ref 4.22–5.81)
RDW: 17.7 % — ABNORMAL HIGH (ref 11.5–15.5)
WBC: 6.1 K/uL (ref 4.0–10.5)

## 2015-03-18 LAB — COMPREHENSIVE METABOLIC PANEL
ALBUMIN: 3.2 g/dL — AB (ref 3.5–5.0)
ALK PHOS: 51 U/L (ref 38–126)
ALT: 23 U/L (ref 17–63)
AST: 37 U/L (ref 15–41)
Anion gap: 5 (ref 5–15)
BILIRUBIN TOTAL: 0.8 mg/dL (ref 0.3–1.2)
BUN: 14 mg/dL (ref 6–20)
CO2: 30 mmol/L (ref 22–32)
CREATININE: 0.89 mg/dL (ref 0.61–1.24)
Calcium: 9.6 mg/dL (ref 8.9–10.3)
Chloride: 104 mmol/L (ref 101–111)
GFR calc Af Amer: >60 mL/min (ref >60)
GFR calc non Af Amer: >60 mL/min (ref >60)
Glucose, Bld: 112 mg/dL — ABNORMAL HIGH (ref 65–99)
Potassium: 3.9 mmol/L (ref 3.5–5.1)
SODIUM: 139 mmol/L (ref 135–145)
Total Protein: 6 g/dL — ABNORMAL LOW (ref 6.5–8.1)

## 2015-03-18 LAB — GLUCOSE, CAPILLARY
GLUCOSE-CAPILLARY: 132 mg/dL — AB (ref 65–99)
Glucose-Capillary: 113 mg/dL — ABNORMAL HIGH (ref 65–99)
Glucose-Capillary: 98 mg/dL (ref 65–99)
Glucose-Capillary: 122 mg/dL — ABNORMAL HIGH (ref 65–99)

## 2015-03-18 LAB — PROTIME-INR
INR: 1.23 (ref 0.00–1.49)
PROTHROMBIN TIME: 15.7 s — AB (ref 11.6–15.2)

## 2015-03-18 LAB — HEPARIN LEVEL (UNFRACTIONATED): HEPARIN UNFRACTIONATED: 0.18 [IU]/mL — AB (ref 0.30–0.70)

## 2015-03-18 LAB — LACTATE DEHYDROGENASE: LDH: 288 U/L — ABNORMAL HIGH (ref 98–192)

## 2015-03-18 MED ORDER — HEPARIN (PORCINE) IN NACL 100-0.45 UNIT/ML-% IJ SOLN
1500.0000 [IU]/h | INTRAMUSCULAR | Status: DC
  Administered 2015-03-18: 1250 [IU]/h via INTRAVENOUS
  Administered 2015-03-19: 1500 [IU]/h via INTRAVENOUS
  Administered 2015-03-19: 1350 [IU]/h via INTRAVENOUS
  Administered 2015-03-20: 1500 [IU]/h via INTRAVENOUS
  Filled 2015-03-18 (×6): qty 250

## 2015-03-18 MED ORDER — WARFARIN SODIUM 6 MG PO TABS
6.0000 mg | ORAL_TABLET | Freq: Once | ORAL | Status: AC
  Administered 2015-03-18: 6 mg via ORAL
  Filled 2015-03-18: qty 1

## 2015-03-18 NOTE — Progress Notes (Addendum)
ANTICOAGULATION CONSULT NOTE - Follow Up Consult  Pharmacy Consult for Heparin + Warfarin Indication: LVAD  No Known Allergies  Patient Measurements: Height: 6' (182.9 cm) Weight: 219 lb 2.2 oz (99.4 kg) IBW/kg (Calculated) : 77.6 Heparin Dosing Weight: 97.7 kg  Vital Signs: Temp: 98 F (36.7 C) (06/18 0650) Temp Source: Oral (06/18 0650) Pulse Rate: 75 (06/18 0650)  Labs:  Recent Labs  03/16/15 0256 03/17/15 0458 03/18/15 0400  HGB  --  12.1* 11.9*  HCT  --  39.4 38.4*  PLT  --  112* 115*  LABPROT 21.9* 17.0* 15.7*  INR 1.92* 1.37 1.23  CREATININE  --  0.74 0.89    Estimated Creatinine Clearance: 90.2 mL/min (by C-G formula based on Cr of 0.89).   Medications:  Heparin @ 1250 units/hr  Assessment: 83 YOM with LVAD s/p ICD extraction 6/16. Per Dr. Prescott Gum / VAD coordinator, plan is to resume Coumadin and start IV heparin if INR < 1.8. Heparin and warfarin were restarted on 6/17 however with bleeding around the ICD extraction site, heparin was subsequently discontinued.   INR drifted down to 1.23 this morning and remains SUBtherapeutic despite a higher dose of warfarin given yesterday evening. Heparin drip was resumed for bridging this morning since INR remained < 1.8 - first HL check is later this evening. Will continue with a higher dose of warfarin tonight but will not be overly aggressive with lower INR goal.  PTA dose noted to be 5 mg daily EXCEPT for 2.5 mg on Mon/Fri. Re-educated on warfarin this admission.  Goal of Therapy:  Heparin level 0.3-0.5 units/ml INR 1.8-2.5 Monitor platelets by anticoagulation protocol: Yes   Plan:  1. Continue heparin at 1250 units/hr (12.5 ml/hr) 2. Warfarin 6 mg x 1 dose at 1800 today 3. Will continue to monitor for any signs/symptoms of bleeding and will follow up with heparin level at 1800 today and PT/INR in the AM  Alycia Rossetti, PharmD, BCPS Clinical Pharmacist Pager: [phone] 03/18/2015 2:48 PM     ------------------------------------------------------------------------------------------------------------------------ Addendum:  Heparin level this evening resulted as SUBtherapeutic (HL 0.18, goal of 0.3-0.5).  No repeat CBC since this AM.  No bleeding issues noted per RN report however the line has been stopping on/off for 5-10 minutes due to occlusions. Will increase conservatively to 1350 units/hr (13.5 ml/hr) and recheck a 8 hr HL.  Plan 1. Increase heparin to 1350 units/hr (13.5 ml/hr) 2. Will continue to monitor for any signs/symptoms of bleeding and will follow up with heparin level in 8 hours   Alycia Rossetti, PharmD, BCPS Clinical Pharmacist Pager: [phone] 03/18/2015 7:02 PM

## 2015-03-18 NOTE — Progress Notes (Signed)
ANTICOAGULATION CONSULT NOTE - Follow- Up Consult  Pharmacy Consult for Heparin when INR < 1.8 Indication: LVAD  No Known Allergies  Patient Measurements: Height: 6' (182.9 cm) Weight: 219 lb 2.2 oz (99.4 kg) IBW/kg (Calculated) : 77.6 Heparin Dosing Weight: 97.4 kg  Vital Signs: Temp: 98 F (36.7 C) (06/18 0650) Temp Source: Oral (06/18 0650) Pulse Rate: 75 (06/18 0650)  Labs:  Recent Labs  03/15/15 1140 03/16/15 0256 03/17/15 0458 03/18/15 0400  HGB 12.4*  --  12.1* 11.9*  HCT 39.9  --  39.4 38.4*  PLT 122*  --  112* 115*  LABPROT 25.3* 21.9* 17.0* 15.7*  INR 2.33* 1.92* 1.37 1.23  CREATININE 0.86  --  0.74 0.89    Estimated Creatinine Clearance: 90.2 mL/min (by C-G formula based on Cr of 0.89).   Assessment: 74 yo male with LVAD s/p ICD extraction 6/16.  INR down to 1.23 today.  Per Dr. Prescott Gum / VAD coordinator, plan is to resume Coumadin today and start IV heparin if INR < 1.8.  No bleeding or complications noted currently.  Yesterday afternoon, patient developed some bleeding at site of ICD extraction.  Heparin was stopped by Dr. Lovena Le.  Now resuming IV heparin. H/H and Plt relatively stable.   Goal of Therapy:  INR 1.8-2.5 Heparin level 0.3-0.7 units/mL  Monitor platelets by anticoagulation protocol: Yes   Plan:  Restart IV heparin gtt at a lower rate of 1250 units/hr. No bolus. F/u 8 hr heparin level Will f/u daily heparin level and CBC Stop IV heparin when INR equal to or greater than 1.8.   Albertina Parr, PharmD., BCPS Clinical Pharmacist Pager [phone]

## 2015-03-18 NOTE — Progress Notes (Signed)
Pt ambulated in hall 350 feet with standby assist. He tolerated walk well.

## 2015-03-18 NOTE — Progress Notes (Signed)
HeartMate 2 Rounding Note  Subjective:    Doing well. No problems overnight. Back on coumadin   LVAD INTERROGATION:  HeartMate II LVAD:  Flow 5.0 liters/min, speed 9200, power 5.0, PI 4.9.  Occasional PI event  Objective:    Vital Signs:   Temp:  [97.4 F (36.3 C)-98 F (36.7 C)] 98 F (36.7 C) (06/18 0650) Pulse Rate:  [52-75] 75 (06/18 0650) Resp:  [16-25] 18 (06/18 0650) BP: (90)/(73) 90/73 mmHg (06/17 0800) SpO2:  [88 %-97 %] 97 % (06/17 2006) Arterial Line BP: (81)/(61) 81/61 mmHg (06/17 0800) Weight:  [99.4 kg (219 lb 2.2 oz)] 99.4 kg (219 lb 2.2 oz) (06/17 2015) Last BM Date: 03/15/15 Mean arterial Pressure: 82 Intake/Output:   Intake/Output Summary (Last 24 hours) at 03/18/15 0729 Last data filed at 03/17/15 1800  Gross per 24 hour  Intake 1256.58 ml  Output    650 ml  Net 606.58 ml     Physical Exam: General:  Well appearing. No resp difficulty HEENT: normal Neck: supple. JVP . Carotids 2+ bilat; no bruits. No lymphadenopathy or thryomegaly appreciated. Cor: Mechanical heart sounds with LVAD hum present. Pacer wound dressing recently redressed. C/d/i Lungs: clear Abdomen: soft, nontender, nondistended. No hepatosplenomegaly. No bruits or masses. Good bowel sounds. Driveline: C/D/I; securement device intact and driveline incorporated Extremities: no cyanosis, clubbing, rash, edema Neuro: alert & orientedx3, cranial nerves grossly intact. moves all 4 extremities w/o difficulty. Affect pleasant  Telemetry: NSR  Labs: Basic Metabolic Panel:  Recent Labs Lab 03/15/15 1140 03/17/15 0458 03/18/15 0400  NA 139 139 139  K 4.0 4.1 3.9  CL 108 105 104  CO2 26 28 30   GLUCOSE 122* 81 112*  BUN 13 9 14   CREATININE 0.86 0.74 0.89  CALCIUM 9.3 9.6 9.6  MG 2.0  --   --     Liver Function Tests:  Recent Labs Lab 03/15/15 1140 03/18/15 0400  AST 47* 37  ALT 29 23  ALKPHOS 55 51  BILITOT 0.7 0.8  PROT 5.9* 6.0*  ALBUMIN 3.4* 3.2*   No results for  input(s): LIPASE, AMYLASE in the last 168 hours. No results for input(s): AMMONIA in the last 168 hours.  CBC:  Recent Labs Lab 03/15/15 1140 03/17/15 0458 03/18/15 0400  WBC 6.2 7.1 6.1  NEUTROABS 4.8  --   --   HGB 12.4* 12.1* 11.9*  HCT 39.9 39.4 38.4*  MCV 95.2 95.9 95.8  PLT 122* 112* 115*    INR:  Recent Labs Lab 03/15/15 1140 03/16/15 0256 03/17/15 0458 03/18/15 0400  INR 2.33* 1.92* 1.37 1.23    Other results:    Imaging: Dg Chest Port 1 View  03/16/2015   CLINICAL DATA:  Internal cardiac defibrillator procedure.  EXAM: PORTABLE CHEST - 1 VIEW  COMPARISON:  12/20/2014.  FINDINGS: The AICD power pack and leads are no longer present. LEFT ventricular assist device is partially visible. Median sternotomy. CABG markers.  The cardiopericardial silhouette is enlarged. Air is interstitial and mild alveolar opacity compatible with CHF/volume overload. Basilar atelectasis. Faintly visible tubing overlies the RIGHT upper chest. This is probably external to the patient and does not conform to its typical central line.  IMPRESSION: 1. AICD lead removal. 2. Cardiomegaly with interstitial and airspace opacity compatible with moderate CHF. 3. Unchanged LEFT ventricular assist device.   Electronically Signed   By: Dereck Ligas M.D.   On: 03/16/2015 11:37     Medications:     Scheduled Medications: . acetaminophen  1,000 mg Oral 4 times per day   Or  . acetaminophen (TYLENOL) oral liquid 160 mg/5 mL  1,000 mg Oral 4 times per day  . amiodarone  200 mg Oral Daily  . antiseptic oral rinse  7 mL Mouth Rinse BID  . atorvastatin  40 mg Oral q1800  . bisacodyl  10 mg Oral Daily  . budesonide-formoterol  2 puff Inhalation BID  . cholecalciferol  1,000 Units Oral BID  . citalopram  40 mg Oral QHS  . docusate sodium  100-200 mg Oral BID  . insulin aspart  0-24 Units Subcutaneous TID AC & HS  . levothyroxine  100 mcg Oral QHS  . losartan  75 mg Oral QHS  . pantoprazole  40 mg  Oral BID  . senna-docusate  1 tablet Oral QHS  . tiotropium  18 mcg Inhalation Daily  . vancomycin  1,000 mg Intravenous Q12H  . cyanocobalamin  1,000 mcg Oral Daily  . vitamin C  500 mg Oral BID  . Warfarin - Pharmacist Dosing Inpatient   Does not apply q1800    Infusions:    PRN Medications: fentaNYL (SUBLIMAZE) injection, levalbuterol, nitroGLYCERIN, ondansetron (ZOFRAN) IV, ondansetron (ZOFRAN) IV, potassium chloride, traMADol   Assessment/Plan  1. 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 Chronic Systolic Heart Failure  4. Chronic Anticoagulation- on coumadin. INR goal 1.8-2.5  5. Anemia /GI Bleed - Most recent 08/2014  6. PAF 7. OSA  Doing well. Bleeding has resolved. INR 1.23. Now back on coumadin. Ideally would like low-dose heparin bridge. Until INR at least 1.6. Will d/w EP.    I reviewed the LVAD parameters from today, and compared the results to the patient's prior recorded data.  No programming changes were made.  The LVAD is functioning within specified parameters.  The patient performs LVAD self-test daily.  LVAD interrogation was negative for any significant power changes, alarms or PI events/speed drops.  LVAD equipment check completed and is in good working order.  Back-up equipment present.   LVAD education done on emergency procedures and precautions and reviewed exit site care.  Length of Stay: 3  Bensimhon, Daniel MD 03/18/2015, 7:29 AM VAD Team --- VAD ISSUES ONLY--- Pager [phone] (7am - 7am) Advanced Heart Failure Team  Pager [phone] (M-F; 7a - 4p)  Please contact [facility] for night-coverage after hours (4p -7a ) and weekends on [email or web address]

## 2015-03-19 LAB — CBC
HCT: 36 % — ABNORMAL LOW (ref 39.0–52.0)
HEMOGLOBIN: 11.3 g/dL — AB (ref 13.0–17.0)
MCH: 30 pg (ref 26.0–34.0)
MCHC: 31.4 g/dL (ref 30.0–36.0)
MCV: 95.5 fL (ref 78.0–100.0)
Platelets: 92 10*3/uL — ABNORMAL LOW (ref 150–400)
RBC: 3.77 MIL/uL — ABNORMAL LOW (ref 4.22–5.81)
RDW: 17.8 % — AB (ref 11.5–15.5)
WBC: 5.6 10*3/uL (ref 4.0–10.5)

## 2015-03-19 LAB — HEPARIN LEVEL (UNFRACTIONATED)
HEPARIN UNFRACTIONATED: 0.23 [IU]/mL — AB (ref 0.30–0.70)
HEPARIN UNFRACTIONATED: 0.31 [IU]/mL (ref 0.30–0.70)
Heparin Unfractionated: 0.35 IU/mL (ref 0.30–0.70)

## 2015-03-19 LAB — PROTIME-INR
INR: 1.32 (ref 0.00–1.49)
Prothrombin Time: 16.5 seconds — ABNORMAL HIGH (ref 11.6–15.2)

## 2015-03-19 LAB — LACTATE DEHYDROGENASE: LDH: 300 U/L — AB (ref 98–192)

## 2015-03-19 LAB — GLUCOSE, CAPILLARY
GLUCOSE-CAPILLARY: 118 mg/dL — AB (ref 65–99)
GLUCOSE-CAPILLARY: 124 mg/dL — AB (ref 65–99)
Glucose-Capillary: 113 mg/dL — ABNORMAL HIGH (ref 65–99)
Glucose-Capillary: 114 mg/dL — ABNORMAL HIGH (ref 65–99)

## 2015-03-19 MED ORDER — WARFARIN SODIUM 6 MG PO TABS
6.0000 mg | ORAL_TABLET | Freq: Once | ORAL | Status: AC
Start: 2015-03-19 — End: 2015-03-19
  Administered 2015-03-19: 6 mg via ORAL
  Filled 2015-03-19: qty 1

## 2015-03-19 MED ORDER — BUDESONIDE-FORMOTEROL FUMARATE 160-4.5 MCG/ACT IN AERO
2.0000 | INHALATION_SPRAY | Freq: Two times a day (BID) | RESPIRATORY_TRACT | Status: DC
  Administered 2015-03-19 – 2015-03-21 (×4): 2 via RESPIRATORY_TRACT
  Filled 2015-03-19: qty 6

## 2015-03-19 NOTE — Progress Notes (Signed)
Minimal amount of bleeding noted on first layers of gauze dressing. Fluff dressing made and applied between gauze and incision for gentle pressure at oozing site. Will continue to monitor site.

## 2015-03-19 NOTE — Progress Notes (Signed)
ANTICOAGULATION CONSULT NOTE - Follow Up Consult  Pharmacy Consult for heparin Indication: LVAD   Labs:  Recent Labs  03/17/15 0458 03/18/15 0400 03/18/15 1754 03/19/15 0449  HGB 12.1* 11.9*  --  11.3*  HCT 39.4 38.4*  --  36.0*  PLT 112* 115*  --  92*  LABPROT 17.0* 15.7*  --  16.5*  INR 1.37 1.23  --  1.32  HEPARINUNFRC  --   --  0.18* 0.23*  CREATININE 0.74 0.89  --   --      Assessment: 74yo male remains subtherapeutic on heparin after rate increase; RN notes that over last 4-5hr dressing is showing some oozing, had been clear when she arrived on shift, Hgb trending down but not dramatically yet.  Goal of Therapy:  Heparin level 0.3-0.5 units/ml   Plan:  Will increase heparin gtt by 1-2 units/kg/hr to 1500 units/hr and check level in 6hr; f/u signs of bleeding.  Wynona Neat, PharmD, BCPS  03/19/2015,5:33 AM

## 2015-03-19 NOTE — Progress Notes (Signed)
HeartMate 2 Rounding Note  Subjective:    Mild drainage from wound site. No other complaints. INR 1.3   LVAD INTERROGATION:  HeartMate II LVAD:  Flow 4.6 liters/min, speed 9200, power 5.0, PI 6.7.  Occasional PI event  Objective:    Vital Signs:   Temp:  [98.3 F (36.8 C)-98.6 F (37 C)] 98.6 F (37 C) (06/19 0359) Pulse Rate:  [52-54] 54 (06/19 0359) Resp:  [16-18] 16 (06/19 0359) SpO2:  [90 %-96 %] 90 % (06/19 0359) Weight:  [100.608 kg (221 lb 12.8 oz)] 100.608 kg (221 lb 12.8 oz) (06/19 0359) Last BM Date: 03/18/15 Mean arterial Pressure: 89 Intake/Output:   Intake/Output Summary (Last 24 hours) at 03/19/15 0756 Last data filed at 03/19/15 0014  Gross per 24 hour  Intake    750 ml  Output   1700 ml  Net   -950 ml     Physical Exam: General:  Well appearing. No resp difficulty HEENT: normal Neck: supple. JVP . Carotids 2+ bilat; no bruits. No lymphadenopathy or thryomegaly appreciated. Cor: Mechanical heart sounds with LVAD hum present. Pacer wound dressing recently redressed. C/d/i Lungs: clear Abdomen: soft, nontender, nondistended. No hepatosplenomegaly. No bruits or masses. Good bowel sounds. Driveline: C/D/I; securement device intact and driveline incorporated Extremities: no cyanosis, clubbing, rash, edema Neuro: alert & orientedx3, cranial nerves grossly intact. moves all 4 extremities w/o difficulty. Affect pleasant  Telemetry: NSR  Labs: Basic Metabolic Panel:  Recent Labs Lab 03/15/15 1140 03/17/15 0458 03/18/15 0400  NA 139 139 139  K 4.0 4.1 3.9  CL 108 105 104  CO2 26 28 30   GLUCOSE 122* 81 112*  BUN 13 9 14   CREATININE 0.86 0.74 0.89  CALCIUM 9.3 9.6 9.6  MG 2.0  --   --     Liver Function Tests:  Recent Labs Lab 03/15/15 1140 03/18/15 0400  AST 47* 37  ALT 29 23  ALKPHOS 55 51  BILITOT 0.7 0.8  PROT 5.9* 6.0*  ALBUMIN 3.4* 3.2*   No results for input(s): LIPASE, AMYLASE in the last 168 hours. No results for input(s):  AMMONIA in the last 168 hours.  CBC:  Recent Labs Lab 03/15/15 1140 03/17/15 0458 03/18/15 0400 03/19/15 0449  WBC 6.2 7.1 6.1 5.6  NEUTROABS 4.8  --   --   --   HGB 12.4* 12.1* 11.9* 11.3*  HCT 39.9 39.4 38.4* 36.0*  MCV 95.2 95.9 95.8 95.5  PLT 122* 112* 115* 92*    INR:  Recent Labs Lab 03/15/15 1140 03/16/15 0256 03/17/15 0458 03/18/15 0400 03/19/15 0449  INR 2.33* 1.92* 1.37 1.23 1.32    Other results:    Imaging: No results found.   Medications:     Scheduled Medications: . acetaminophen  1,000 mg Oral 4 times per day   Or  . acetaminophen (TYLENOL) oral liquid 160 mg/5 mL  1,000 mg Oral 4 times per day  . amiodarone  200 mg Oral Daily  . antiseptic oral rinse  7 mL Mouth Rinse BID  . atorvastatin  40 mg Oral q1800  . bisacodyl  10 mg Oral Daily  . budesonide-formoterol  2 puff Inhalation BID  . cholecalciferol  1,000 Units Oral BID  . citalopram  40 mg Oral QHS  . docusate sodium  100-200 mg Oral BID  . insulin aspart  0-24 Units Subcutaneous TID AC & HS  . levothyroxine  100 mcg Oral QHS  . losartan  75 mg Oral QHS  . pantoprazole  40 mg Oral BID  . senna-docusate  1 tablet Oral QHS  . tiotropium  18 mcg Inhalation Daily  . cyanocobalamin  1,000 mcg Oral Daily  . vitamin C  500 mg Oral BID  . Warfarin - Pharmacist Dosing Inpatient   Does not apply q1800    Infusions: . heparin 1,500 Units/hr (03/19/15 0554)    PRN Medications: fentaNYL (SUBLIMAZE) injection, levalbuterol, nitroGLYCERIN, ondansetron (ZOFRAN) IV, ondansetron (ZOFRAN) IV, potassium chloride, traMADol   Assessment/Plan  1. 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 Chronic Systolic Heart Failure  4. Chronic Anticoagulation- on coumadin. INR goal 1.8-2.5  5. Anemia /GI Bleed - Most recent 08/2014  6. PAF 7. OSA  Doing well. Mild drainage from wound site. On coumadin and low-dose heparin. Will watch site  closely. Can stop heparin and let him go home when INR > 1.6    I reviewed the LVAD parameters from today, and compared the results to the patient's prior recorded data.  No programming changes were made.  The LVAD is functioning within specified parameters.  The patient performs LVAD self-test daily.  LVAD interrogation was negative for any significant power changes, alarms or PI events/speed drops.  LVAD equipment check completed and is in good working order.  Back-up equipment present.   LVAD education done on emergency procedures and precautions and reviewed exit site care.  Length of Stay: 4  Glori Bickers MD 03/19/2015, 7:56 AM VAD Team --- VAD ISSUES ONLY--- Pager [phone] (7am - 7am) Advanced Heart Failure Team  Pager [phone] (M-F; 7a - 4p)  Please contact [facility] for night-coverage after hours (4p -7a ) and weekends on [email or web address]

## 2015-03-19 NOTE — Progress Notes (Signed)
One end of dressing saturated. Dressing removed. Slow ooze noted at proximal end of incision. No hematoma noted. Fluff dressing covered by 4x4s applied. Will continue to monitor.

## 2015-03-19 NOTE — Progress Notes (Signed)
03/19/2015 6:52 AM The drainage on the patient's dressing continues to slowly grow.  New place marked.  Will inform the next nurse of dressing. Lupita Dawn

## 2015-03-19 NOTE — Progress Notes (Signed)
Blood noted in left nare. No active bleeding seen. Pt reminded not to blow his nose. Will continue to monitor.

## 2015-03-19 NOTE — Progress Notes (Addendum)
ANTICOAGULATION CONSULT NOTE - Follow Up Consult  Pharmacy Consult for Heparin + Warfarin Indication: LVAD  No Known Allergies  Patient Measurements: Height: 6' (182.9 cm) Weight: 221 lb 12.8 oz (100.608 kg) IBW/kg (Calculated) : 77.6 Heparin Dosing Weight: 97.7 kg  Vital Signs: Temp: 98.6 F (37 C) (06/19 0359) Temp Source: Oral (06/19 0359) Pulse Rate: 54 (06/19 0359)  Labs:  Recent Labs  03/17/15 0458 03/18/15 0400 03/18/15 1754 03/19/15 0449 03/19/15 1145  HGB 12.1* 11.9*  --  11.3*  --   HCT 39.4 38.4*  --  36.0*  --   PLT 112* 115*  --  92*  --   LABPROT 17.0* 15.7*  --  16.5*  --   INR 1.37 1.23  --  1.32  --   HEPARINUNFRC  --   --  0.18* 0.23* 0.31  CREATININE 0.74 0.89  --   --   --     Estimated Creatinine Clearance: 90.8 mL/min (by C-G formula based on Cr of 0.89).   Medications:  Heparin @ 1500 units/hr  Assessment: 48 YOM with LVAD s/p ICD extraction 6/16. Per Dr. Prescott Gum / VAD coordinator, plan is to resume Coumadin and start IV heparin if INR < 1.8. There have been some bleeding/oozing noted around the ICD extraction site requiring heparin to be intermittently held.   INR this morning remains SUBtherapeutic (INR 1.32 << 1.23, goal of 1.8-2.5). Heparin level is therapeutic after a rate increase earlier today (HL 0.31 << 0.23, goal of 0.3-0.5). The patient's RN noted some oozing around the dressing starting this morning. The site has since been addressed by Dr. Haroldine Laws and described and mild and to watch. The RN reports the site appears stable with a pressure bandage in place - will keep monitoring closely. Will continue with a higher dose of warfarin tonight (1.5x home dose) but will not be overly aggressive with lower INR goal.  PTA dose noted to be 5 mg daily EXCEPT for 2.5 mg on Mon/Fri. Re-educated on warfarin this admission.  Goal of Therapy:  Heparin level 0.3-0.5 units/ml INR 1.8-2.5 Monitor platelets by anticoagulation protocol: Yes    Plan:  1. Continue heparin at 1500 units/hr (15 ml/hr) 2. Repeat warfarin 6 mg x 1 dose at 1800 today 3. Will continue to monitor for any signs/symptoms of bleeding and will follow up with heparin level in 8 hours to confirm therapeutic and PT/INR in the AM  Alycia Rossetti, PharmD, BCPS Clinical Pharmacist Pager: [phone] 03/19/2015 2:08 PM

## 2015-03-19 NOTE — Progress Notes (Signed)
03/19/2015 5:43 AM The patient incision dressing was clean and no drainage was scene during most of the night.  At around 4:00 AM it was noted that the dressing had serous drainage on about half of the dressing.  The drainage site was marked to watch and see if the drainage continues to grow.  The patient is also on a heparin drip for his LVAD.  The drip was going at a rate of 13.5 mL/hr.  When the lab called to say the heparin level was low (0.23) they said they would increase the rate of the drip.  They were notified of the patient's incision site and the drainage found there and said they would only raise the rate a little bit and to notify them if the drainage increases on the dressing.  Will continue to monitor the patient. Lupita Dawn

## 2015-03-19 NOTE — Progress Notes (Signed)
ANTICOAGULATION CONSULT NOTE - Follow Up Consult  Pharmacy Consult for Heparin + Warfarin Indication: LVAD  No Known Allergies  Patient Measurements: Height: 6' (182.9 cm) Weight: 221 lb 12.8 oz (100.608 kg) IBW/kg (Calculated) : 77.6 Heparin Dosing Weight: 97.7 kg  Vital Signs: Pulse Rate: 66 (06/19 1700)  Labs:  Recent Labs  03/17/15 0458 03/18/15 0400  03/19/15 0449 03/19/15 1145 03/19/15 1913  HGB 12.1* 11.9*  --  11.3*  --   --   HCT 39.4 38.4*  --  36.0*  --   --   PLT 112* 115*  --  92*  --   --   LABPROT 17.0* 15.7*  --  16.5*  --   --   INR 1.37 1.23  --  1.32  --   --   HEPARINUNFRC  --   --   < > 0.23* 0.31 0.35  CREATININE 0.74 0.89  --   --   --   --   < > = values in this interval not displayed.  Estimated Creatinine Clearance: 90.8 mL/min (by C-G formula based on Cr of 0.89).   Medications:  Heparin @ 1500 units/hr  Assessment: 17 YOM with LVAD s/p ICD extraction 6/16. Per Dr. Prescott Gum / VAD coordinator, plan is to resume Coumadin and start IV heparin if INR < 1.8. There have been some bleeding/oozing noted around the ICD extraction site requiring heparin to be intermittently held.   INR this morning remains SUBtherapeutic (INR 1.32 << 1.23, goal of 1.8-2.5). Heparin level is therapeutic after a rate increase earlier today (HL 0.31 << 0.23, goal of 0.3-0.5). The patient's RN noted some oozing around the dressing starting this morning. The site has since been addressed by Dr. Haroldine Laws and described and mild and to watch. The RN reports the site appears stable with a pressure bandage in place - will keep monitoring closely. Will continue with a higher dose of warfarin tonight (1.5x home dose) but will not be overly aggressive with lower INR goal.  PTA dose noted to be 5 mg daily EXCEPT for 2.5 mg on Mon/Fri. Re-educated on warfarin this admission.  Confirmatory HL remains therapeutic at 0.35 on heparin 1500 units/hr. Nurse reports no further oozing since  gauze change.   Goal of Therapy:  Heparin level 0.3-0.5 units/ml INR 1.8-2.5 Monitor platelets by anticoagulation protocol: Yes   Plan:  1. Continue heparin at 1500 units/hr (15 ml/hr) 2. Repeat warfarin 6 mg x 1 dose at 1800 today 3. Will continue to monitor for any signs/symptoms of bleeding and PT/INR in the AM  [facility]. Diona Foley, PharmD Clinical Pharmacist Pager [phone]  03/19/2015 8:03 PM

## 2015-03-20 LAB — CBC
HEMATOCRIT: 39.4 % (ref 39.0–52.0)
Hemoglobin: 12.8 g/dL — ABNORMAL LOW (ref 13.0–17.0)
MCH: 30.3 pg (ref 26.0–34.0)
MCHC: 32.5 g/dL (ref 30.0–36.0)
MCV: 93.4 fL (ref 78.0–100.0)
PLATELETS: 105 10*3/uL — AB (ref 150–400)
RBC: 4.22 MIL/uL (ref 4.22–5.81)
RDW: 17.9 % — AB (ref 11.5–15.5)
WBC: 7.2 10*3/uL (ref 4.0–10.5)

## 2015-03-20 LAB — PROTIME-INR
INR: 1.42 (ref 0.00–1.49)
PROTHROMBIN TIME: 17.4 s — AB (ref 11.6–15.2)

## 2015-03-20 LAB — BASIC METABOLIC PANEL
Anion gap: 8 (ref 5–15)
BUN: 13 mg/dL (ref 6–20)
CHLORIDE: 107 mmol/L (ref 101–111)
CO2: 25 mmol/L (ref 22–32)
CREATININE: 0.81 mg/dL (ref 0.61–1.24)
Calcium: 9.9 mg/dL (ref 8.9–10.3)
GFR calc Af Amer: >60 mL/min (ref >60)
GFR calc non Af Amer: >60 mL/min (ref >60)
Glucose, Bld: 108 mg/dL — ABNORMAL HIGH (ref 65–99)
Potassium: 4 mmol/L (ref 3.5–5.1)
Sodium: 140 mmol/L (ref 135–145)

## 2015-03-20 LAB — GLUCOSE, CAPILLARY
GLUCOSE-CAPILLARY: 105 mg/dL — AB (ref 65–99)
GLUCOSE-CAPILLARY: 112 mg/dL — AB (ref 65–99)
GLUCOSE-CAPILLARY: 112 mg/dL — AB (ref 65–99)
Glucose-Capillary: 92 mg/dL (ref 65–99)

## 2015-03-20 LAB — TYPE AND SCREEN
ABO/RH(D): O POS
Antibody Screen: NEGATIVE
Unit division: 0
Unit division: 0

## 2015-03-20 LAB — HEPARIN LEVEL (UNFRACTIONATED): HEPARIN UNFRACTIONATED: 0.36 [IU]/mL (ref 0.30–0.70)

## 2015-03-20 LAB — LACTATE DEHYDROGENASE: LDH: 334 U/L — AB (ref 98–192)

## 2015-03-20 MED ORDER — SILVER NITRATE-POT NITRATE 75-25 % EX MISC
1.0000 "application " | CUTANEOUS | Status: DC | PRN
  Filled 2015-03-20: qty 1

## 2015-03-20 MED ORDER — WARFARIN SODIUM 7.5 MG PO TABS
7.5000 mg | ORAL_TABLET | ORAL | Status: AC
  Administered 2015-03-20: 7.5 mg via ORAL
  Filled 2015-03-20: qty 1

## 2015-03-20 NOTE — Progress Notes (Signed)
CARDIAC REHAB PHASE I   PRE:  Rate/Rhythm: 50 SB   BP:  Supine:   Sitting: 88 MAP  Standing:    SaO2:   MODE:  Ambulation: 550 ft   POST:  Rate/Rhythm: 74 SR  BP:  Supine:   Sitting: 108 MAP, 90 MAP after rest  Standing:    SaO2: 94%RA 1425-1515 Pt walked 550 ft on RA with hand held asst. Stopped once to rest. Left to batteries since RN taking him to celebration later. MAP elevated after walk but to 90 with rest.  To chair. Pt put self to batteries.   Graylon Good, RN BSN  03/20/2015 3:10 PM

## 2015-03-20 NOTE — Progress Notes (Signed)
HeartMate 2 Rounding Note  Subjective:    ICD dressing saturated with blood. INR 1.4. No SOB.   No other complaints.    LVAD INTERROGATION:  HeartMate II LVAD:  Flow 4.6 liters/min, speed 9200, power 5 , PI  4.6.  Occasional PI event  Objective:    Vital Signs:   Temp:  [97.7 F (36.5 C)-98.4 F (36.9 C)] 98.4 F (36.9 C) (06/20 0415) Pulse Rate:  [60-83] 60 (06/20 0415) Resp:  [18] 18 (06/20 0415) SpO2:  [90 %-94 %] 94 % (06/20 0851) Weight:  [214 lb 4.8 oz (97.206 kg)] 214 lb 4.8 oz (97.206 kg) (06/20 0415) Last BM Date: 03/20/15 Mean arterial Pressure: 89 Intake/Output:   Intake/Output Summary (Last 24 hours) at 03/20/15 0936 Last data filed at 03/20/15 0804  Gross per 24 hour  Intake    240 ml  Output   1325 ml  Net  -1085 ml     Physical Exam: General:  Well appearing. No resp difficulty HEENT: normal Neck: supple. JVP 6-7. Carotids 2+ bilat; no bruits. No lymphadenopathy or thryomegaly appreciated. Cor: Mechanical heart sounds with LVAD hum present. Pacer wound dressing saturated with bloody exudated. Medial aspect of wound with ongoing ooze.  Lungs: clear Abdomen: soft, nontender, nondistended. No hepatosplenomegaly. No bruits or masses. Good bowel sounds. Driveline: C/D/I; securement device intact and driveline incorporated Extremities: no cyanosis, clubbing, rash, edema Neuro: alert & orientedx3, cranial nerves grossly intact. moves all 4 extremities w/o difficulty. Affect pleasant  Telemetry: NSR  Labs: Basic Metabolic Panel:  Recent Labs Lab 03/15/15 1140 03/17/15 0458 03/18/15 0400 03/20/15 0316  NA 139 139 139 140  K 4.0 4.1 3.9 4.0  CL 108 105 104 107  CO2 26 28 30 25   GLUCOSE 122* 81 112* 108*  BUN 13 9 14 13   CREATININE 0.86 0.74 0.89 0.81  CALCIUM 9.3 9.6 9.6 9.9  MG 2.0  --   --   --     Liver Function Tests:  Recent Labs Lab 03/15/15 1140 03/18/15 0400  AST 47* 37  ALT 29 23  ALKPHOS 55 51  BILITOT 0.7 0.8  PROT 5.9*  6.0*  ALBUMIN 3.4* 3.2*   No results for input(s): LIPASE, AMYLASE in the last 168 hours. No results for input(s): AMMONIA in the last 168 hours.  CBC:  Recent Labs Lab 03/15/15 1140 03/17/15 0458 03/18/15 0400 03/19/15 0449 03/20/15 0316  WBC 6.2 7.1 6.1 5.6 7.2  NEUTROABS 4.8  --   --   --   --   HGB 12.4* 12.1* 11.9* 11.3* 12.8*  HCT 39.9 39.4 38.4* 36.0* 39.4  MCV 95.2 95.9 95.8 95.5 93.4  PLT 122* 112* 115* 92* 105*    INR:  Recent Labs Lab 03/16/15 0256 03/17/15 0458 03/18/15 0400 03/19/15 0449 03/20/15 0316  INR 1.92* 1.37 1.23 1.32 1.42    Other results:    Imaging: No results found.   Medications:     Scheduled Medications: . acetaminophen  1,000 mg Oral 4 times per day   Or  . acetaminophen (TYLENOL) oral liquid 160 mg/5 mL  1,000 mg Oral 4 times per day  . amiodarone  200 mg Oral Daily  . antiseptic oral rinse  7 mL Mouth Rinse BID  . atorvastatin  40 mg Oral q1800  . bisacodyl  10 mg Oral Daily  . budesonide-formoterol  2 puff Inhalation BID  . cholecalciferol  1,000 Units Oral BID  . citalopram  40 mg Oral QHS  .  docusate sodium  100-200 mg Oral BID  . insulin aspart  0-24 Units Subcutaneous TID AC & HS  . levothyroxine  100 mcg Oral QHS  . losartan  75 mg Oral QHS  . pantoprazole  40 mg Oral BID  . senna-docusate  1 tablet Oral QHS  . tiotropium  18 mcg Inhalation Daily  . cyanocobalamin  1,000 mcg Oral Daily  . vitamin C  500 mg Oral BID  . Warfarin - Pharmacist Dosing Inpatient   Does not apply q1800    Infusions: . heparin 1,500 Units/hr (03/19/15 2331)    PRN Medications: fentaNYL (SUBLIMAZE) injection, levalbuterol, nitroGLYCERIN, ondansetron (ZOFRAN) IV, ondansetron (ZOFRAN) IV, potassium chloride, traMADol   Assessment/Plan  1. 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 Chronic Systolic Heart Failure  4. Chronic Anticoagulation- on coumadin. INR goal 1.8-2.5   5. Anemia /GI Bleed - Most recent 08/2014  6. PAF 7. OSA  Doing well. Ongoing bloody ooze from medial aspect of incision. Per Dr Caryl Comes will apply silver nitrate followed by pressure dressing. Oozing resolved with silver nitrate application.   Continue heparin + coumadin. Hemoglobin stable.  Will watch site closely. Can stop heparin and let him go home when INR > 1.6 . Todays INR 1.4   I reviewed the LVAD parameters from today, and compared the results to the patient's prior recorded data.  No programming changes were made.  The LVAD is functioning within specified parameters.  The patient performs LVAD self-test daily.  LVAD interrogation was negative for any significant power changes, alarms or PI events/speed drops.  LVAD equipment check completed and is in good working order.  Back-up equipment present.   LVAD education done on emergency procedures and precautions and reviewed exit site care.  Length of Stay: 5  CLEGG,AMY NP-C  03/20/2015, 9:36 AM VAD Team --- VAD ISSUES ONLY--- Pager [phone] (7am - 7am) Advanced Heart Failure Team  Pager [phone] (M-F; 7a - 4p)  Please contact [facility] for night-coverage after hours (4p -7a ) and weekends on [email or web address]  Patient seen and examined with [name], NP. We discussed all aspects of the encounter. I agree with the assessment and plan as stated above.   Overall doing well. Continued oozing from ICD site. We evaluated with Dr. Caryl Comes at bedside and treated with silver noitrate. Continue heparin and coumadin. Home when INR > 1.6. VAD parameters are stable.   Bensimhon, Daniel,MD 11:57 AM

## 2015-03-20 NOTE — Progress Notes (Signed)
03/20/2015 3:44 AM Red drainage noted on dressing of left upper chest.  New drainage area circled with marker.  Will watch if drainage continues to grow. Lupita Dawn

## 2015-03-20 NOTE — Progress Notes (Signed)
ANTICOAGULATION CONSULT NOTE - Follow Up Consult  Pharmacy Consult for Heparin + Warfarin Indication: LVAD  No Known Allergies  Patient Measurements: Height: 6' (182.9 cm) Weight: 214 lb 4.8 oz (97.206 kg) IBW/kg (Calculated) : 77.6 Heparin Dosing Weight: 97.7 kg  Vital Signs: Temp: 98.4 F (36.9 C) (06/20 0415) Temp Source: Oral (06/20 0415) Pulse Rate: 60 (06/20 0415)  Labs:  Recent Labs  03/18/15 0400  03/19/15 0449 03/19/15 1145 03/19/15 1913 03/20/15 0316  HGB 11.9*  --  11.3*  --   --  12.8*  HCT 38.4*  --  36.0*  --   --  39.4  PLT 115*  --  92*  --   --  105*  LABPROT 15.7*  --  16.5*  --   --  17.4*  INR 1.23  --  1.32  --   --  1.42  HEPARINUNFRC  --   < > 0.23* 0.31 0.35 0.36  CREATININE 0.89  --   --   --   --  0.81  < > = values in this interval not displayed.  Estimated Creatinine Clearance: 98.1 mL/min (by C-G formula based on Cr of 0.81).   Medications:  Heparin @ 1500 units/hr  Assessment: 46 YOM with LVAD s/p ICD extraction 6/16. Per Dr. Prescott Gum / VAD coordinator: started IV heparin when INR < 1.8.  Heparin drip 1500 uts/hr HL 0.36 at goal.  There have been some bleeding/oozing noted around the ICD extraction site requiring heparin to be intermittently held. Oozing stopped with silver nitrate sticks today.  CBC stable.  INR this morning remains SUBtherapeutic (INR 1.42  goal of 1.8-2.5).  Will continue with a higher dose of warfarin tonight - as pt received Vit K prior to procedure.  PTA dose noted to be 5 mg daily EXCEPT for 2.5 mg on Mon/Fri. Re-educated on warfarin this admission.    Goal of Therapy:  Heparin level 0.3-0.5 units/ml INR 1.8-2.5 Monitor platelets by anticoagulation protocol: Yes   Plan:  1. Continue heparin at 1500 units/hr (15 ml/hr) 2.  warfarin 7.5mg  x 1 dose at 1800 today 3. Will continue to monitor for any signs/symptoms of bleeding and PT/INR in the AM  [employer].D. CPP, BCPS Clinical  Pharmacist [phone] 03/20/2015 3:40 PM

## 2015-03-20 NOTE — Progress Notes (Signed)
No bleeding from ICD site; dressing D&I; will cont. To monitor.

## 2015-03-20 NOTE — Progress Notes (Signed)
Admitted 03/15/15 due to ICD extraction [name]) on 03/16/15.   HeartMate II LVAD implated on 01/12/14 by Dr. Prescott Gum for destination therapy (DT).   Vital signs: HR: 60 - 83 MAP:  84 - 90 O2 Sat: 90 - 94% RA Wt in lbs:  216 > 217 > 219 > 221 > 214   LVAD interrogation reveals:  Speed: 9200 Flow: 4.4 Power:  5.1 PI: 5.4 Alarms: none  Events:  none Fixed speed: 9200 Low speed limit: 8600   Drive Line:  Being maintained weekly by his wife [name]--dressing change completed recently for the week per her. Sorbaview dressing intact with no Biopatch at exit site (d/t irritation to skin). OK to use Chloraprep sticks. Dressing change due  03/23/15.  Labs:  LDH trend: 316 > 313 > 302 > 288 > 300 > 334  INR trend: 1.92 > 1.37 > 1.23 > 1.32 > 1.42  Heparin gtt: re-started 03/18/15  No ASA due to hx of GI bleed  Coumadin: re-started 03/17/15  Plan/Recommendations as discussed with team: 1.  Bleeding from ICD dressing site; Dr. Haroldine Laws and Dr. Caryl Comes evaluated - plan for silver nitrate per [name], NP. If continued bleeding, will place pressure dressing using elastoplast tape. 2.  Continue heparin until INR 1.6

## 2015-03-21 ENCOUNTER — Other Ambulatory Visit (HOSPITAL_COMMUNITY): Payer: Self-pay | Admitting: *Deleted

## 2015-03-21 DIAGNOSIS — Z7901 Long term (current) use of anticoagulants: Secondary | ICD-10-CM

## 2015-03-21 DIAGNOSIS — Z95811 Presence of heart assist device: Secondary | ICD-10-CM

## 2015-03-21 DIAGNOSIS — I5022 Chronic systolic (congestive) heart failure: Secondary | ICD-10-CM

## 2015-03-21 LAB — CBC
HCT: 36.9 % — ABNORMAL LOW (ref 39.0–52.0)
Hemoglobin: 11.4 g/dL — ABNORMAL LOW (ref 13.0–17.0)
MCH: 29.8 pg (ref 26.0–34.0)
MCHC: 30.9 g/dL (ref 30.0–36.0)
MCV: 96.6 fL (ref 78.0–100.0)
Platelets: 103 10*3/uL — ABNORMAL LOW (ref 150–400)
RBC: 3.82 MIL/uL — AB (ref 4.22–5.81)
RDW: 18.1 % — AB (ref 11.5–15.5)
WBC: 6.5 10*3/uL (ref 4.0–10.5)

## 2015-03-21 LAB — PROTIME-INR
INR: 1.69 — AB (ref 0.00–1.49)
Prothrombin Time: 19.9 seconds — ABNORMAL HIGH (ref 11.6–15.2)

## 2015-03-21 LAB — HEPARIN LEVEL (UNFRACTIONATED): Heparin Unfractionated: 0.34 IU/mL (ref 0.30–0.70)

## 2015-03-21 LAB — LACTATE DEHYDROGENASE: LDH: 285 U/L — ABNORMAL HIGH (ref 98–192)

## 2015-03-21 LAB — GLUCOSE, CAPILLARY: GLUCOSE-CAPILLARY: 106 mg/dL — AB (ref 65–99)

## 2015-03-21 MED ORDER — WARFARIN SODIUM 5 MG PO TABS: ORAL_TABLET | ORAL | Status: DC

## 2015-03-21 NOTE — Discharge Summary (Signed)
Advanced Heart Failure Team  Discharge Summary   Patient ID: [name] MRN: [id number], DOB/AGE: May 08, 1941 74 y.o. Admit date: 03/15/2015 D/C date:     03/21/2015   Primary Discharge Diagnoses:  1. 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 Chronic Systolic Heart Failure  4. Chronic Anticoagulation- on coumadin. INR goal 1.8-2.5  5. Anemia /GI Bleed - Most recent 08/2014  6. PAF 7. 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 Quit smoking 2003. Uses CPAP and O2 every night. He is s/p LVAD HM II implanted 01/12/13 under DT criteria. GI bleed June, October, and December 2015. Had bleeding from duodenum-->clip and jejunum --> 3 clips.   The end of May he was evaluated by Dr Caryl Comes and he had tethering at ICD site. He was admitted for scheduled ICD extraction 03/16/15 by Dr Lovena Le. On June 16th he had  ICD extracted without complications. Heparin was started 6 hours after surgery. He remained on heparin + coumadin until INR therapeutic.  From HF/LVAD perspective he remained stable and will continue on current regimen.  INR will be checked on Monday at Dr Forde Dandy office. He will follow up in the LVAD clinic next Thursday.   I reviewed the LVAD parameters from today, and compared the results to the patient's prior recorded data. No programming changes were made. The LVAD is functioning within specified parameters. The patient performs LVAD self-test daily. LVAD interrogation was negative for any significant power changes, alarms or PI events/speed drops. LVAD equipment check completed and is in good working order. Back-up equipment present. LVAD education done on emergency procedures and precautions and reviewed exit site care.   LVAD Interrogation HM II:   Speed: 9200     Flow: 4.6    PI: 6.3      Power:  5.2    No PI events overnight.     Discharge Weight Range: 219 pounds  Discharge Vitals: Blood pressure 90/73, pulse 71, temperature 98 F (36.7 C), temperature source Oral, resp. rate 16, height 6' (1.829 m), weight 219 lb 5.7 oz (99.5 kg), SpO2 96 %.   Physical Exam: General: Well appearing. No resp difficulty. Sitting in chair.  HEENT: normal Neck: supple. JVP 6-7 . Carotids 2+ bilat; no bruits. No lymphadenopathy or thryomegaly appreciated. Cor: Mechanical heart sounds with LVAD hum present. Dressing over ICD pocket CDI.  Lungs: clear Abdomen: soft, nontender, nondistended. No hepatosplenomegaly. No bruits or masses. Good bowel sounds. Driveline: C/D/I; securement device intact and driveline incorporated Extremities: no cyanosis, clubbing, rash, edema Neuro: alert & orientedx3, cranial nerves grossly intact. moves all 4 extremities w/o difficulty. Affect pleasant  Telemetry: NSR  Labs: Lab Results  Component Value Date   WBC 6.5 03/21/2015   HGB 11.4* 03/21/2015   HCT 36.9* 03/21/2015   MCV 96.6 03/21/2015   PLT 103* 03/21/2015     Recent Labs Lab 03/18/15 0400 03/20/15 0316  NA 139 140  K 3.9 4.0  CL 104 107  CO2 30 25  BUN 14 13  CREATININE 0.89 0.81  CALCIUM 9.6 9.9  PROT 6.0*  --   BILITOT 0.8  --   ALKPHOS 51  --   ALT 23  --   AST 37  --   GLUCOSE 112* 108*   Lab Results  Component Value  Date   CHOL 126 07/02/2012   HDL 40.80 07/02/2012   LDLCALC 58 07/02/2012   TRIG 134.0 07/02/2012   BNP (last 3 results)  Recent Labs  12/20/14 1400  BNP 197.6*    ProBNP (last 3 results)  Recent Labs  07/19/14 0830 07/23/14 1410 08/09/14 0953  PROBNP 296.4* 241.4* 478.1*     Diagnostic Studies/Procedures   No results found.  Discharge Medications     Medication List    TAKE these medications        amiodarone 200 MG tablet  Commonly known as:  PACERONE  Take 1 tablet (200 mg total) by mouth daily.      budesonide-formoterol 160-4.5 MCG/ACT inhaler  Commonly known as:  SYMBICORT  Inhale 2 puffs into the lungs 2 (two) times daily.     cholecalciferol 1000 UNITS tablet  Commonly known as:  VITAMIN D  Take 1 tablet (1,000 Units total) by mouth 2 (two) times daily.     citalopram 40 MG tablet  Commonly known as:  CELEXA  Take 1 tablet (40 mg total) by mouth at bedtime.     cyanocobalamin 1000 MCG tablet  Take 1 tablet (1,000 mcg total) by mouth daily.     docusate sodium 100 MG capsule  Commonly known as:  COLACE  Take 1-2 capsules (100-200 mg total) by mouth 2 (two) times daily. Take one tab in am and two tabs hs     furosemide 40 MG tablet  Commonly known as:  LASIX  Take 1 tablet (40 mg total) by mouth daily as needed (fluid retention).     levothyroxine 100 MCG tablet  Commonly known as:  SYNTHROID, LEVOTHROID  Take 1 tablet (100 mcg total) by mouth at bedtime.     losartan 50 MG tablet  Commonly known as:  COZAAR  Take 1.5 tablets (75 mg total) by mouth at bedtime.     MULTIVITAMIN PO  Take 1 tablet by mouth at bedtime.     nitroGLYCERIN 0.4 MG SL tablet  Commonly known as:  NITROSTAT  Place 1 tablet (0.4 mg total) under the tongue every 5 (five) minutes as needed for chest pain.     pantoprazole 40 MG tablet  Commonly known as:  PROTONIX  Take 1 tablet (40 mg total) by mouth 2 (two) times daily.     potassium chloride 10 MEQ tablet  Commonly known as:  K-DUR  Take 2 tablets (20 mEq total) by mouth daily as needed (when taking Furosemide).     simvastatin 80 MG tablet  Commonly known as:  ZOCOR  Take 0.5 tablets (40 mg total) by mouth at bedtime.     tiotropium 18 MCG inhalation capsule  Commonly known as:  SPIRIVA  Place 1 capsule (18 mcg total) into inhaler and inhale daily.     vitamin C 500 MG tablet  Commonly known as:  ASCORBIC ACID  Take 1 tablet (500 mg total) by mouth 2 (two) times daily.     warfarin 5 MG tablet  Commonly known as:  COUMADIN   Take 2.5 mg on Mon and Fri. All other days take 5 mg . On  6/21 take 2.5 mg. .        Disposition   The patient will be discharged in stable condition to home. Discharge Instructions    ACE Inhibitor / ARB already ordered    Complete by:  As directed      Diet - low sodium heart healthy  Complete by:  As directed      Discharge wound care:    Complete by:  As directed   Keep dry gauze over incision.     Heart Failure patients record your daily weight using the same scale at the same time of day    Complete by:  As directed      Increase activity slowly    Complete by:  As directed           Follow-up Information    Follow up with Glori Bickers, MD On 03/27/2015.   Specialty:  Cardiology   Why:  at 10:00    Contact information:   [address] [place] [address] [phone]       Follow up with [facility] On 03/27/2015.   Why:  at 4:30PM for suture removal - or come over after appt with Dr Casper Harrison information:   [address] [place] [id number]       Follow up with Glori Bickers, MD On 03/30/2015.   Specialty:  Cardiology   Why:  at 11:00 [place] information:   [place] [address] [phone]         Duration of Discharge Encounter: Greater than 35 minutes   Signed, CLEGG,AMY NP-C  03/21/2015, 9:40 AM   Patient seen and examined with [name], NP. We discussed all aspects of the encounter. I agree with the assessment and plan as stated above.   Doing well. INR 1.7.  Pacer site looks good. Ok to go home today. VAD parameters stable.   Bensimhon, Daniel,MD 10:28 AM

## 2015-03-21 NOTE — Progress Notes (Signed)
ANTICOAGULATION CONSULT NOTE - Follow Up Consult  Pharmacy Consult for Heparin + Warfarin Indication: LVAD  No Known Allergies  Patient Measurements: Height: 6' (182.9 cm) Weight: 219 lb 5.7 oz (99.5 kg) IBW/kg (Calculated) : 77.6 Heparin Dosing Weight: 97.7 kg  Vital Signs: Temp: 98 F (36.7 C) (06/21 0445) Temp Source: Oral (06/21 0445) Pulse Rate: 71 (06/21 0445)  Labs:  Recent Labs  03/19/15 0449  03/19/15 1913 03/20/15 0316 03/21/15 0323  HGB 11.3*  --   --  12.8* 11.4*  HCT 36.0*  --   --  39.4 36.9*  PLT 92*  --   --  105* 103*  LABPROT 16.5*  --   --  17.4* 19.9*  INR 1.32  --   --  1.42 1.69*  HEPARINUNFRC 0.23*  < > 0.35 0.36 0.34  CREATININE  --   --   --  0.81  --   < > = values in this interval not displayed.  Estimated Creatinine Clearance: 99.3 mL/min (by C-G formula based on Cr of 0.81).   Medications:  Heparin @ 1500 units/hr  Assessment: 76 YOM with LVAD s/p ICD extraction 6/16. Per Dr. Prescott Gum / VAD coordinator: started IV heparin when INR < 1.8.  Heparin drip 1500 uts/hr HL 0.34 at goal.  There had been some bleeding/oozing noted around the ICD extraction site requiring heparin to be intermittently held. Oozing stopped with silver nitrate sticks 6/20.  CBC stable.  INR this morning remains SUBtherapeutic (INR 1.69  goal of 1.8-2.5). But big jump last 2 days and now close to goal.  Plan to stop heparin and go home.  PTA dose noted to be 5 mg daily EXCEPT for 2.5 mg on Mon/Fri. Re-educated on warfarin this admission.    Goal of Therapy:  Heparin level 0.3-0.5 units/ml INR 1.8-2.5 Monitor platelets by anticoagulation protocol: Yes   Plan:  1. Stop heparin drip 2. warfarin 2.5mg  today then restart home dose tomorrow 5mg  daily M/F 2.5mg   3. Will continue to monitor for any signs/symptoms of bleeding   Bonnita Nasuti Pharm.D. CPP, BCPS Clinical Pharmacist [phone] 03/21/2015 9:29 AM

## 2015-03-21 NOTE — Progress Notes (Signed)
Pt discharged home with wife Discharge instructions given & reviewed Education discussed  Scripts given IV dc'd  Tele dc'd  Pt discharged via wheelchair with volunteer services All patient belongings at side.  Kathleen Argue S 10:51 AM

## 2015-03-21 NOTE — Progress Notes (Signed)
Utilization review completed.  

## 2015-03-21 NOTE — Progress Notes (Addendum)
VAD team paged, MAP 96. [name] 8:35 AM   Molly notified and aware. 8:36 AM

## 2015-03-21 NOTE — Progress Notes (Signed)
Admitted 03/15/15 due to ICD extraction [name]) on 03/16/15.   HeartMate II LVAD implated on 01/12/14 by Dr. Prescott Gum for destination therapy (DT).   Vital signs: HR: 71 - 79 MAP:  84 - 90 O2 Sat: 94 - 96% RA Wt in lbs:  216 > 217 > 219 > 221 > 214 > 219   LVAD interrogation reveals:  Speed: 9200 Flow: 4.5 Power:  5.2 PI: 6.3 Alarms: none  Events:  2 PI events  Fixed speed: 9200 Low speed limit: 8600   Drive Line:  Being maintained weekly by his wife [name]. Sorbaview dressing intact with no Biopatch at exit site (d/t irritation to skin). OK to use Chloraprep sticks. Dressing change due 03/23/15.  Pt/caregiver provided adequate dressing supplies for home use.   Labs:  LDH trend: 316 > 313 > 302 > 288 > 300 > 334 > 285  INR trend: 1.92 > 1.37 > 1.23 > 1.32 > 1.42 > 1.69  Heparin gtt: re-started 03/18/15; stopped 03/21/15  No ASA due to hx of GI bleed  Coumadin: re-started 03/17/15  Plan/Recommendations as discussed with team: 1.  Discharge home today; reinforce ICD dressing if any further bleeding. 2.  F/U with Dr. Lovena Le Monday 03/27/15.  Asked pt to get INR at [facility] lab same day for INR check.  3.  Return to [place] clinic Thursday 03/30/15 at 10:00 for hospital discharge f/u appt.

## 2015-03-21 NOTE — Progress Notes (Signed)
CARDIAC REHAB PHASE I   Pt disconnected from telemetry, dressed, connected to batteries, states "I'm ready to go home." Offered to walk prior to discharge, pt declined, states he has no questions or concerns at this time.     Lenna Sciara, RN, BSN 03/21/2015 10:07 AM

## 2015-03-21 NOTE — Care Management Note (Signed)
Case Management Note Marvetta Gibbons RN, BSN Unit 2W-Case Manager [phone]  Patient Details  Name: [name] MRN: [id number] Date of Birth: 08/23/1941  Subjective/Objective:    Pt admitted s/p ICD infection with extraction (hx LVAD 2014)                Action/Plan:  PTA pt lived at home with spouse- plan to return home when medically stable INR therapeutic   Expected Discharge Date:       03/21/15           Expected Discharge Plan:  Home/Self Care  In-House Referral:     Discharge planning Services  CM Consult  Post Acute Care Choice:    Choice offered to:     DME Arranged:    DME Agency:     HH Arranged:    [place] Agency:     Status of Service:  Completed, signed off  Medicare Important Message Given:  No Date Medicare IM Given:    Medicare IM give by:    Date Additional Medicare IM Given:    Additional Medicare Important Message give by:     If discussed at [place] of Stay Meetings, dates discussed:  03/21/15  Additional Comments:  Dawayne Patricia, RN 03/21/2015, 9:45 AM

## 2015-03-24 ENCOUNTER — Other Ambulatory Visit (HOSPITAL_COMMUNITY): Payer: Self-pay | Admitting: *Deleted

## 2015-03-24 DIAGNOSIS — Z95811 Presence of heart assist device: Secondary | ICD-10-CM

## 2015-03-24 DIAGNOSIS — Z7901 Long term (current) use of anticoagulants: Secondary | ICD-10-CM

## 2015-03-27 ENCOUNTER — Ambulatory Visit (INDEPENDENT_AMBULATORY_CARE_PROVIDER_SITE_OTHER): Payer: Non-veteran care | Admitting: *Deleted

## 2015-03-27 ENCOUNTER — Ambulatory Visit (HOSPITAL_COMMUNITY): Payer: Self-pay | Admitting: *Deleted

## 2015-03-27 ENCOUNTER — Other Ambulatory Visit: Payer: Non-veteran care

## 2015-03-27 ENCOUNTER — Other Ambulatory Visit: Payer: Self-pay

## 2015-03-27 ENCOUNTER — Ambulatory Visit (HOSPITAL_COMMUNITY)
Admission: RE | Admit: 2015-03-27 | Discharge: 2015-03-27 | Disposition: A | Discharge status: Home / Self Care | Payer: Medicare Other | Source: Ambulatory Visit | Attending: Internal Medicine | Admitting: Internal Medicine

## 2015-03-27 VITALS — BP 123/74 | HR 56 | Ht 73.0 in | Wt 228.6 lb

## 2015-03-27 DIAGNOSIS — Z7901 Long term (current) use of anticoagulants: Secondary | ICD-10-CM | POA: Diagnosis not present

## 2015-03-27 DIAGNOSIS — I5022 Chronic systolic (congestive) heart failure: Secondary | ICD-10-CM

## 2015-03-27 DIAGNOSIS — Z95811 Presence of heart assist device: Secondary | ICD-10-CM | POA: Insufficient documentation

## 2015-03-27 DIAGNOSIS — D500 Iron deficiency anemia secondary to blood loss (chronic): Secondary | ICD-10-CM

## 2015-03-27 DIAGNOSIS — Z95810 Presence of automatic (implantable) cardiac defibrillator: Secondary | ICD-10-CM

## 2015-03-27 DIAGNOSIS — I482 Chronic atrial fibrillation, unspecified: Secondary | ICD-10-CM

## 2015-03-27 LAB — PROTIME-INR
INR: 2.36 — ABNORMAL HIGH (ref 0.00–1.49)
Prothrombin Time: 25.6 seconds — ABNORMAL HIGH (ref 11.6–15.2)

## 2015-03-27 NOTE — Progress Notes (Signed)
Symptom  Yes  No  Details   Angina         x Activity:   Claudication                x How far:  Syncope         x When:   Stroke         x   Orthopnea         x How many pillows:   2 for comfort  PND         x How often:  CPAP         x  How many hrs:  All night  Pedal edema         x   Abd fullness         x   N&V         x   Diaphoresis         x When:  Bleeding               x   Urine color    medium yellow  SOB        x  Activity:  Incline; one block level ground   Palpitations         x When:  ICD shock         x   Hospitlizaitons        x        When/where/why:  10/24 - 08/04/14 anemia; SOB  ED visit         x When/where/why:  Other MD              x When/who/why:    Activity    No formal activity; sedentary  Fluid    No limitations  Diet    No limitations   Vital signs: HR:  56 Doppler MAP:  96 Auto cuff:  123/74 (92) O2 Sat: 93  Wt:  228.6  lbs Last clinic wt::  225.6  Lbs Hospital D/C wt:  219 lbs Ht: 6'1"  LVAD interrogation reveals:  Speed: 9200 Flow:  4.9 Power:  5.3 PI:  6.5 Alarms: none Events: 0 - 5 PI events        Fixed speed:  9200 Low speed limit: 8600 Primary Controller:  Replace back up battery in 13 months (January 2017) Back up controller:   Replace back up battery in 13 months (January 2017)  LVAD exit site:  Well healed and incorporated. The velour is fully implanted at exit site. Dressing dry and intact. No erythema or drainage. Stabilization device present and accurately applied. Driveline dressing is being changed weekly per sterile technique using Sorbaview dressing with biopatch on exit site. Pt denies fever or chills. Dressing supplies provided to patient/caregiver.  Pt/caregiver deny any alarms or VAD equipment issues.Back-up equipment present.   Patient Instructions:  1. Pt has f/u appt for iron infusion 04/13/15; will check labs on 04/11/15 (CBC with diff, Ferretin, INR) at [facility] 2. Pt has f/u appt with Dr. Lovena Le today for suture  removal on ICD site. 3.  No change in meds. 4.  Return to VAD clinic May 09, 2015 for 6 week f/u.

## 2015-03-27 NOTE — Patient Instructions (Signed)
1. Pt has f/u appt for iron infusion 04/13/15; will check labs on 04/11/15 (CBC with diff, Ferretin, INR) at [facility] 2. Pt has f/u appt with Dr. Lovena Le today for suture removal on ICD site. 3.  No change in meds. 4.  Return to VAD clinic May 09, 2015 for 6 week f/u.

## 2015-03-27 NOTE — Progress Notes (Signed)
Wound check s/p device extraction 03/16/15. Sutures removed. Wound well healed without redness, swelling drainage or bleeding. Chanetta Marshall, NP visualized incision. Patient instructed to call office if incision becomes red, swollen, starts draining or if he experiences fever or chills. Patient verbalizes understanding. ROV with GT 04/25/15 at 9:45am.

## 2015-03-29 NOTE — Progress Notes (Signed)
Patient ID: [name], male   DOB: 03-10-1941, 74 y.o.   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 Quit smoking 2003. Uses CPAP and O2 every night. He is s/p LVAD HM II implanted 01/12/13 under DT criteria  Admitted to [facility] 09/13/13 - 09/14/13 for increased fatigue and dyspnea. Found to have mild CHF and anemia. Diuresed with IV lasix and transitioned to PO lasix 40 mg twice a week. Discharge weight 203 lbs on inpatient scale.  Admitting labs revealed Hgb 7.7 for which he received 2 units PCs with appropriate rise in Hgb.   Admit 6/8-6/12 for symptomatic anemia. Had EGD/Colonoscopy which was unrevealing.  6 mm polyp clipped and removed. CT abdomen showed cirrhosis. Placed on heparin bridge. Capsule study pending. Sent home on lovenox. D/C weight 206 lbs.   Admitted to [facility] with with GI bleed. GI consulted. Push enteroscopy 10/28 negative. Colonoscopy 10/29 without source of bleeding.Result of capsule endoscopy--> Showed bleeding from duodenum. EGD--->Bleeding from duodenum with clip applied. Overall he received  10 UPRBCs. Discharge weight was 207 pounds.   Admitted to [facility] 09/21/14 with GI bleed . GI consulted. Enteroscopy showed bleed from jejunum requiring 3 clips. Overall he received 4 units PRBCs.  He went back in A fib and had DC-CV.  On the day discharge he was back in NSR on amiodarone 200 mg twice a day. Discharge weight was 214 pounds.   Admitted in 6/16 for ICD extraction due to pocket infection. Did well. No complications. No bleeding from site. Feels good. Still with some chronic DOE but no change. No edema. Does not take lasix. Compliant with all meds.    He returns for post-hospital follow up. Feels very good. Relieved to have gotten through ICD  extraction. Denie   Denies LVAD alarms. Denies driveline trauma, erythema or drainage.  Denies ICD shocks. Reports taking Coumadin as prescribed and adherence to anticoagulation based dietary restrictions. Denies bright red blood per rectum or melena, no dark urine or hematuria.   Labs (2/16): LDL 305, HCT 38.9 Labs (3/16): K 3.9, creatinine 1.05, LFTs normal   Past Medical History  Diagnosis Date  . Ischemic cardiomyopathy      CABG 2003, PCI 2007  EF 27%(myoview 2012  . Chronic systolic heart failure   . Hyperlipidemia   . Hypothyroidism   . Chronic anticoagulation     Afib and LVAD  . Obesity   . COPD (chronic obstructive pulmonary disease)   . Asbestosis(501)     "6 years in the [place]" (05/24/2013)  . Atrial fibrillation     permanent  . Paroxysmal ventricular tachycardia   . Coronary artery disease   . Implantable cardioverter-defibrillator [employer]   . Hypertension   . Myocardial infarction 1990's-2000    "2 in  ~ the 1990's; 1 in ~ 2000" (05/24/2013)  . OSA on CPAP   . Depression   . LVAD (left ventricular assist device) present 12/2012    Current Outpatient Prescriptions  Medication Sig Dispense Refill  . amiodarone (PACERONE) 200 MG tablet Take 1 tablet (200 mg total) by mouth daily. 90 tablet 3  . budesonide-formoterol (SYMBICORT) 160-4.5 MCG/ACT inhaler Inhale 2 puffs into the lungs 2 (two) times daily. 1 Inhaler 3  . cholecalciferol (VITAMIN D) 1000 UNITS tablet Take 1  tablet (1,000 Units total) by mouth 2 (two) times daily. 180 tablet 3  . citalopram (CELEXA) 40 MG tablet Take 1 tablet (40 mg total) by mouth at bedtime. 90 tablet 3  . cyanocobalamin 1000 MCG tablet Take 1 tablet (1,000 mcg total) by mouth daily. (Patient taking differently: Take 1,000 mcg by mouth at bedtime. ) 90 tablet 3  . docusate sodium (COLACE) 100 MG capsule Take 1-2 capsules (100-200 mg total) by mouth 2 (two) times daily. Take one tab in am and two tabs hs (Patient taking differently: Take  200 mg by mouth 2 (two) times daily. ) 180 capsule 3  . levothyroxine (SYNTHROID, LEVOTHROID) 100 MCG tablet Take 1 tablet (100 mcg total) by mouth at bedtime. 90 tablet 3  . losartan (COZAAR) 50 MG tablet Take 1.5 tablets (75 mg total) by mouth at bedtime. 135 tablet 3  . Multiple Vitamins-Minerals (MULTIVITAMIN PO) Take 1 tablet by mouth at bedtime.     . pantoprazole (PROTONIX) 40 MG tablet Take 1 tablet (40 mg total) by mouth 2 (two) times daily. 180 tablet 3  . simvastatin (ZOCOR) 80 MG tablet Take 0.5 tablets (40 mg total) by mouth at bedtime. 45 tablet 3  . tiotropium (SPIRIVA) 18 MCG inhalation capsule Place 1 capsule (18 mcg total) into inhaler and inhale daily. 90 capsule 3  . vitamin C (ASCORBIC ACID) 500 MG tablet Take 1 tablet (500 mg total) by mouth 2 (two) times daily. 180 tablet 3  . warfarin (COUMADIN) 5 MG tablet Take 2.5 mg on Mon and Fri. All other days take 5 mg . On  6/21 take 2.5 mg. . 200 tablet 3  . furosemide (LASIX) 40 MG tablet Take 1 tablet (40 mg total) by mouth daily as needed (fluid retention). (Patient not taking: Reported on 03/27/2015) 90 tablet 3  . nitroGLYCERIN (NITROSTAT) 0.4 MG SL tablet Place 1 tablet (0.4 mg total) under the tongue every 5 (five) minutes as needed for chest pain. (Patient not taking: Reported on 03/27/2015) 30 tablet 3  . potassium chloride (K-DUR) 10 MEQ tablet Take 2 tablets (20 mEq total) by mouth daily as needed (when taking Furosemide). (Patient not taking: Reported on 03/27/2015) 180 tablet 3   No current facility-administered medications for this encounter.    Review of patient's allergies indicates no known allergies.  REVIEW OF SYSTEMS: All systems negative except as listed in HPI, PMH and Problem list.   Filed Vitals:   03/27/15 1017 03/27/15 1018  BP: 90/0 123/74  Pulse: 56   Height: 6\' 1"  (1.854 m)   Weight: 228 lb 9.6 oz (103.692 kg)   SpO2: 93%        LVAD interrogation reveals:  Speed: 9200 Flow: 4.9 Power:  5.3 PI: 6.5 Alarms: none Events: 0 - 5 PI events  Fixed speed: 9200 Low speed limit: 8600 Primary Controller: Replace back up battery in 13 months (January 2017) Back up controller: Replace back up battery in 13 months (January 2017)  Physical Exam:  HR: 56 Doppler MAP: 96 Auto cuff: 123/74 (92) O2 Sat: 93  Wt: 228.6 lbs Last clinic wt:: 225.6 Lbs Hospital D/C wt: 219 lbs Ht: 6'1"  GENERAL: Well appearing, male; NAD; wife present HEENT: normal  NECK: Supple, JVP 8 no bruits.  No lymphadenopathy or thyromegaly appreciated.   CARDIAC: Mechanical heart sounds with LVAD hum present. L upper chest wound looks good LUNGS:  Clear to auscultation bilaterally.  ABDOMEN:  Obese, Soft, round, nontender, positive bowel sounds x4.  LVAD exit site: well-healed and incorporated.  Dressing dry and intact.  No erythema, drainage, odor or tenderness.  Stabilization device present and accurately applied.  Driveline dressing is being changed daily per sterile technique. Changed in clinic EXTREMITIES:  Warm and dry, no cyanosis, clubbing, no edema NEUROLOGIC:  Alert and oriented x 4.  Gait steady.  No aphasia.  No dysarthria.  Affect pleasant.     ASSESSMENT AND PLAN:   1) Chronic Systolic HF: s/p LVAD implant 12/2012 for DT. - Doing well. NYHA II. Volume status stable. MAPs good. - Not on BB due to previous bradycardia.  . Continue current losartan.   - Reinforced the need and importance of daily weights, a low sodium diet, and fluid restriction (less than 2 L a day). Instructed to call the HF clinic if weight increases more than 3 lbs overnight or 5 lbs in a week.  2)  Anticoagulation management: INR goal 1.8-2.5 with history of GI bleeding.  He is not on ASA because of bleeding.   Continue coumadinSee anticoagulation flow sheet.  3) Anemia/ GIB: Most recent GI bleed 09/21/14. Had bleeding noted in jejunum requiring 3 clips.  Continue PPI and stool softener. No BRBPR.  CBC  today Hgb 13.1   4 LVAD: No issues. All parameters within normal limits.  Continue weekly dressing changes. Admit next week for ICD extraction. Stop coumadin the day before.  - Check CBC, BMET, LDH and INR today.   5. PAF: Paroxysmal.  15% burden AF on 2/16 event monitor.  By exam regular rate rhtym.    Continue  amiodarone to 200 mg daily, recent LFTs and TSH normal.  Should have yearly eye exam.   6. Depression: Stable.  Continue Celexa 40 mg daily 7. OSA: Using CPAP.  8. Hypoxemia:  Uses home oxygen at night.  This may be due to his baseline COPD. 9. ICD infeciton - s/p extraction 6/16. Will f/u with Dr. Lovena Le in July for suture removal  Glori Bickers MD  03/29/2015

## 2015-03-30 ENCOUNTER — Encounter (HOSPITAL_COMMUNITY): Payer: Non-veteran care

## 2015-04-11 ENCOUNTER — Ambulatory Visit (HOSPITAL_COMMUNITY): Payer: Self-pay | Admitting: Infectious Diseases

## 2015-04-11 ENCOUNTER — Other Ambulatory Visit (INDEPENDENT_AMBULATORY_CARE_PROVIDER_SITE_OTHER): Payer: Non-veteran care | Admitting: *Deleted

## 2015-04-11 ENCOUNTER — Encounter (HOSPITAL_COMMUNITY): Payer: Self-pay | Admitting: Infectious Diseases

## 2015-04-11 ENCOUNTER — Other Ambulatory Visit: Payer: Medicare Other

## 2015-04-11 DIAGNOSIS — D500 Iron deficiency anemia secondary to blood loss (chronic): Secondary | ICD-10-CM | POA: Diagnosis not present

## 2015-04-11 DIAGNOSIS — Z95811 Presence of heart assist device: Secondary | ICD-10-CM

## 2015-04-11 DIAGNOSIS — Z7901 Long term (current) use of anticoagulants: Secondary | ICD-10-CM | POA: Diagnosis not present

## 2015-04-11 DIAGNOSIS — I482 Chronic atrial fibrillation, unspecified: Secondary | ICD-10-CM

## 2015-04-11 LAB — CBC WITH DIFFERENTIAL/PLATELET
BASOS PCT: 0.3 % (ref 0.0–3.0)
Basophils Absolute: 0 10*3/uL (ref 0.0–0.1)
EOS ABS: 0.2 10*3/uL (ref 0.0–0.7)
EOS PCT: 3.4 % (ref 0.0–5.0)
HCT: 38.7 % — ABNORMAL LOW (ref 39.0–52.0)
Hemoglobin: 12.5 g/dL — ABNORMAL LOW (ref 13.0–17.0)
Lymphocytes Relative: 9.5 % — ABNORMAL LOW (ref 12.0–46.0)
Lymphs Abs: 0.7 10*3/uL (ref 0.7–4.0)
MCHC: 32.3 g/dL (ref 30.0–36.0)
MCV: 92.2 fl (ref 78.0–100.0)
Monocytes Absolute: 0.5 10*3/uL (ref 0.1–1.0)
Monocytes Relative: 6.5 % (ref 3.0–12.0)
NEUTROS PCT: 80.3 % — AB (ref 43.0–77.0)
Neutro Abs: 5.6 10*3/uL (ref 1.4–7.7)
Platelets: 128 10*3/uL — ABNORMAL LOW (ref 150.0–400.0)
RBC: 4.2 Mil/uL — ABNORMAL LOW (ref 4.22–5.81)
RDW: 19.3 % — AB (ref 11.5–15.5)
WBC: 7 10*3/uL (ref 4.0–10.5)

## 2015-04-11 LAB — PROTIME-INR
INR: 2.5 ratio — ABNORMAL HIGH (ref 0.8–1.0)
PROTHROMBIN TIME: 26.7 s — AB (ref 9.6–13.1)

## 2015-04-11 LAB — FERRITIN: Ferritin: 20.9 ng/mL — ABNORMAL LOW (ref 22.0–322.0)

## 2015-04-11 NOTE — Progress Notes (Signed)
Ferritin and CBC results routed to Dr. Burr Medico for iron infusion management.

## 2015-04-11 NOTE — Addendum Note (Signed)
Addended by: Eulis Foster on: 04/11/2015 07:58 AM   Modules accepted: Orders

## 2015-04-11 NOTE — Progress Notes (Signed)
Call received from [place] from [facility] at [phone] regarding previous authorization for visits starting 12/20/14 and requesting clinical paperwork. New authorization packet being sent to clinic regarding new authorization for 12 clinic follow up visits.   Clinicals sent regarding 12/20/14, 01/19/15, 03/07/15, 03/27/15 visits to requested fax number.

## 2015-04-13 ENCOUNTER — Ambulatory Visit (HOSPITAL_BASED_OUTPATIENT_CLINIC_OR_DEPARTMENT_OTHER): Payer: Non-veteran care

## 2015-04-13 VITALS — BP 111/73 | HR 52 | Temp 98.0°F | Resp 16

## 2015-04-13 DIAGNOSIS — D62 Acute posthemorrhagic anemia: Secondary | ICD-10-CM | POA: Diagnosis not present

## 2015-04-13 MED ORDER — SODIUM CHLORIDE 0.9 % IV SOLN
510.0000 mg | Freq: Once | INTRAVENOUS | Status: AC
  Administered 2015-04-13: 510 mg via INTRAVENOUS
  Filled 2015-04-13: qty 17

## 2015-04-13 MED ORDER — SODIUM CHLORIDE 0.9 % IV SOLN
Freq: Once | INTRAVENOUS | Status: AC
  Administered 2015-04-13: 08:00:00 via INTRAVENOUS

## 2015-04-13 NOTE — Patient Instructions (Signed)

## 2015-04-13 NOTE — Addendum Note (Signed)
Encounter addended by: Ivin Poot, MD on: 04/13/2015  3:39 PM<BR>     Documentation filed: Notes Section, Clinical Notes

## 2015-04-13 NOTE — Progress Notes (Addendum)
CT surgery-surgical backup attestation 4 AICD extraction 03/16/2015   [name] is a patient who underwent previous implantable left ventricular assist device by myself for ischemic cardiomyopathy and advanced heart failure. The patient was admitted to the hospital for removal of a exposed-infected AICD-pacemaker system. One of the pacing wires had eroded to the skin. Fortunately the patient did not need a permanent pacemaker. Dr. Osie Cheeks performed the extraction of AICD leads and generator under general anesthesia in the operating room. I provided surgical backup for one hour for this procedure and was available in the operating room if needed. The implantable left assist device functioned normally during the procedure.

## 2015-04-14 ENCOUNTER — Ambulatory Visit: Payer: Medicare Other

## 2015-04-21 ENCOUNTER — Ambulatory Visit (HOSPITAL_BASED_OUTPATIENT_CLINIC_OR_DEPARTMENT_OTHER): Payer: Medicare Other

## 2015-04-21 VITALS — BP 107/81 | HR 48 | Temp 97.8°F | Resp 18

## 2015-04-21 DIAGNOSIS — D62 Acute posthemorrhagic anemia: Secondary | ICD-10-CM | POA: Diagnosis not present

## 2015-04-21 MED ORDER — SODIUM CHLORIDE 0.9 % IV SOLN
510.0000 mg | Freq: Once | INTRAVENOUS | Status: AC
  Administered 2015-04-21: 510 mg via INTRAVENOUS
  Filled 2015-04-21: qty 17

## 2015-04-21 NOTE — Patient Instructions (Signed)

## 2015-04-24 ENCOUNTER — Encounter: Payer: Self-pay | Admitting: Internal Medicine

## 2015-04-24 ENCOUNTER — Ambulatory Visit (HOSPITAL_COMMUNITY): Payer: Self-pay | Admitting: Infectious Diseases

## 2015-04-24 ENCOUNTER — Other Ambulatory Visit (INDEPENDENT_AMBULATORY_CARE_PROVIDER_SITE_OTHER): Payer: Non-veteran care | Admitting: *Deleted

## 2015-04-24 ENCOUNTER — Ambulatory Visit (INDEPENDENT_AMBULATORY_CARE_PROVIDER_SITE_OTHER): Payer: Non-veteran care | Admitting: Internal Medicine

## 2015-04-24 VITALS — BP 100/82 | HR 55 | Ht 73.0 in | Wt 228.8 lb

## 2015-04-24 DIAGNOSIS — I480 Paroxysmal atrial fibrillation: Secondary | ICD-10-CM

## 2015-04-24 DIAGNOSIS — I5022 Chronic systolic (congestive) heart failure: Secondary | ICD-10-CM | POA: Diagnosis not present

## 2015-04-24 DIAGNOSIS — Z7901 Long term (current) use of anticoagulants: Secondary | ICD-10-CM

## 2015-04-24 DIAGNOSIS — T827XXS Infection and inflammatory reaction due to other cardiac and vascular devices, implants and grafts, sequela: Secondary | ICD-10-CM | POA: Diagnosis not present

## 2015-04-24 DIAGNOSIS — Z95811 Presence of heart assist device: Secondary | ICD-10-CM

## 2015-04-24 DIAGNOSIS — I482 Chronic atrial fibrillation, unspecified: Secondary | ICD-10-CM

## 2015-04-24 LAB — PROTIME-INR
INR: 2.8 ratio — ABNORMAL HIGH (ref 0.8–1.0)
Prothrombin Time: 29.8 s — ABNORMAL HIGH (ref 9.6–13.1)

## 2015-04-24 NOTE — Progress Notes (Signed)
HPI [name] returns today for followup. He is a pleasant 74 yo man with an end stage cardiomyopathy, s/p LVAD insertion who developed erosion of his ICD pocket. He returns today after having undergone extraction several weeks ago. He feels well. No infectious symptoms. No residual drainage from his device.  No Known Allergies   Current Outpatient Prescriptions  Medication Sig Dispense Refill  . amiodarone (PACERONE) 200 MG tablet Take 1 tablet (200 mg total) by mouth daily. 90 tablet 3  . budesonide-formoterol (SYMBICORT) 160-4.5 MCG/ACT inhaler Inhale 2 puffs into the lungs 2 (two) times daily. 1 Inhaler 3  . cholecalciferol (VITAMIN D) 1000 UNITS tablet Take 1 tablet (1,000 Units total) by mouth 2 (two) times daily. 180 tablet 3  . citalopram (CELEXA) 40 MG tablet Take 1 tablet (40 mg total) by mouth at bedtime. 90 tablet 3  . docusate sodium (COLACE) 100 MG capsule Take 200 mg by mouth 2 (two) times daily.    . furosemide (LASIX) 40 MG tablet Take 40 mg by mouth as needed for fluid.    [name] levothyroxine (SYNTHROID, LEVOTHROID) 100 MCG tablet Take 1 tablet (100 mcg total) by mouth at bedtime. 90 tablet 3  . losartan (COZAAR) 50 MG tablet Take 1.5 tablets (75 mg total) by mouth at bedtime. 135 tablet 3  . Multiple Vitamins-Minerals (MULTIVITAMIN PO) Take 1 tablet by mouth at bedtime.     . nitroGLYCERIN (NITROSTAT) 0.4 MG SL tablet Place 0.4 mg under the tongue every 5 (five) minutes as needed for chest pain.    . pantoprazole (PROTONIX) 40 MG tablet Take 1 tablet (40 mg total) by mouth 2 (two) times daily. 180 tablet 3  . potassium chloride (K-DUR) 10 MEQ tablet Take 10 mEq by mouth as needed (when taking furosemide).    . simvastatin (ZOCOR) 80 MG tablet Take 0.5 tablets (40 mg total) by mouth at bedtime. 45 tablet 3  . tiotropium (SPIRIVA) 18 MCG inhalation capsule Place 1 capsule (18 mcg total) into inhaler and inhale daily. 90 capsule 3  . vitamin B-12 (CYANOCOBALAMIN) 1000 MCG  tablet Take 1,000 mcg by mouth at bedtime.    . vitamin C (ASCORBIC ACID) 500 MG tablet Take 1 tablet (500 mg total) by mouth 2 (two) times daily. 180 tablet 3  . warfarin (COUMADIN) 5 MG tablet Take 2.5 mg on Mon and Fri. All other days take 5 mg . On  6/21 take 2.5 mg. . 200 tablet 3   No current facility-administered medications for this visit.     Past Medical History  Diagnosis Date  . Ischemic cardiomyopathy      CABG 2003, PCI 2007  EF 27%(myoview 2012  . Chronic systolic heart failure   . Hyperlipidemia   . Hypothyroidism   . Chronic anticoagulation     Afib and LVAD  . Obesity   . COPD (chronic obstructive pulmonary disease)   . Asbestosis(501)     "6 years in the [place]" (05/24/2013)  . Atrial fibrillation     permanent  . Paroxysmal ventricular tachycardia   . Coronary artery disease   . Implantable cardioverter-defibrillator Medtronic   . Hypertension   . Myocardial infarction 1990's-2000    "2 in  ~ the 1990's; 1 in ~ 2000" (05/24/2013)  . OSA on CPAP   . Depression   . LVAD (left ventricular assist device) present 12/2012    ROS:   All systems reviewed and negative except as noted in the HPI.  Past Surgical History  Procedure Laterality Date  . Coronary artery bypass graft  2003    "?X4" (05/24/2013)  . Cardiac defibrillator placement  2004; ~ 2010  . Insertion of implantable left ventricular assist device N/A 01/12/2013    Procedure: INSERTION OF IMPLANTABLE LEFT VENTRICULAR ASSIST DEVICE;  Surgeon: Ivin Poot, MD;  Location: [place];  Service: Open Heart Surgery;  Laterality: N/A;   nitric oxide; Redo sternotomy  . Intraoperative transesophageal echocardiogram N/A 01/12/2013    Procedure: INTRAOPERATIVE TRANSESOPHAGEAL ECHOCARDIOGRAM;  Surgeon: Ivin Poot, MD;  Location: [place];  Service: Open Heart Surgery;  Laterality: N/A;  . Colonoscopy N/A 03/09/2014    Procedure: COLONOSCOPY;  Surgeon: Inda Castle, MD;  Location: [place];  Service:  Endoscopy;  Laterality: N/A;  LVAD  patient  . Esophagogastroduodenoscopy N/A 03/09/2014    Procedure: ESOPHAGOGASTRODUODENOSCOPY (EGD);  Surgeon: Inda Castle, MD;  Location: [place];  Service: Endoscopy;  Laterality: N/A;  . Givens capsule study N/A 03/10/2014    Procedure: GIVENS CAPSULE STUDY;  Surgeon: Inda Castle, MD;  Location: [place];  Service: Endoscopy;  Laterality: N/A;  . Enteroscopy N/A 07/27/2014    Procedure: ENTEROSCOPY;  Surgeon: Gatha Mayer, MD;  Location: [place];  Service: Endoscopy;  Laterality: N/A;  LVAD patient   . Colonoscopy N/A 07/29/2014    Procedure: COLONOSCOPY;  Surgeon: Gatha Mayer, MD;  Location: [place];  Service: Endoscopy;  Laterality: N/A;  . Givens capsule study N/A 07/30/2014    Procedure: GIVENS CAPSULE STUDY;  Surgeon: Gatha Mayer, MD;  Location: [place];  Service: Endoscopy;  Laterality: N/A;  . Esophagogastroduodenoscopy N/A 08/01/2014    Procedure: ESOPHAGOGASTRODUODENOSCOPY (EGD);  Surgeon: Jerene Bears, MD;  Location: [facility] ENDOSCOPY;  Service: Endoscopy;  Laterality: N/A;  LVAD patient  . Left and right heart catheterization with coronary/graft angiogram  01/07/2013    Procedure: LEFT AND RIGHT HEART CATHETERIZATION WITH Beatrix Fetters;  Surgeon: Jolaine Artist, MD;  Location:  [facility] CATH LAB;  Service: Cardiovascular;;  . Enteroscopy N/A 09/27/2014    Procedure: ENTEROSCOPY;  Surgeon: Gatha Mayer, MD;  Location: [facility] ENDOSCOPY;  Service: Endoscopy;  Laterality: N/A;  . Icd lead removal Left 03/16/2015    Procedure: ICD LEAD REMOVAL/EXTRACTION;  Surgeon: Evans Lance, MD;  Location: [place];  Service: Cardiovascular;  Laterality: Left;  PATIENT HAS LVAD  DR. VAN TRIGT TO BACK UP EXTRACTION     Family History  Problem Relation Age of Onset  . Heart attack Mother   . Heart attack Father      History   Social History  . Marital Status: Married    Spouse Name: N/A  . Number of Children: N/A    . Years of Education: N/A   Occupational History  . Not on file.   Social History Main Topics  . Smoking status: Former Smoker -- 2.00 packs/day for 45 years    Types: Cigarettes    Quit date: 11/11/2001  . Smokeless tobacco: Never Used  . Alcohol Use: No     Comment: 05/24/2013 "use to drink beer; hardly nothing since 2003; once in awhile a beer "  . Drug Use: No  . Sexual Activity: No   Other Topics Concern  . Not on file   Social History Narrative     BP 100/82 mmHg  Pulse 55  Ht 6\' 1"  (1.854 m)  Wt 228 lb 12.8 oz (103.783 kg)  BMI 30.19 kg/m2  SpO2 95%  Physical Exam:  Well appearing 74 yo man, NAD HEENT: Unremarkable Neck:  No JVD, no thyromegally Incision - well healed with no drainage or erythema Back:  No CVA tenderness Lungs:  Clear with no wheezes HEART:  Regular rate rhythm, no murmurs, no rubs, no clicks Abd:  soft, positive bowel sounds, no organomegally, no rebound, no guarding Ext:  2 plus pulses, no edema, no cyanosis, no clubbing Skin:  No rashes no nodules Neuro:  CN II through XII intact, motor grossly intact   Assess/Plan:

## 2015-04-24 NOTE — Assessment & Plan Note (Signed)
With his LVAD in place, he appears to be class 2. He will follow up in CHF clinic.

## 2015-04-24 NOTE — Assessment & Plan Note (Signed)
His ventricular rate is well controlled on exam. He will continue warfarin and amio.

## 2015-04-24 NOTE — Addendum Note (Signed)
Addended by: Eulis Foster on: 04/24/2015 08:30 AM   Modules accepted: Orders

## 2015-04-24 NOTE — Assessment & Plan Note (Signed)
He is s/p extraction. He appears to be well healed with no infectious symptoms.

## 2015-04-24 NOTE — Patient Instructions (Signed)
Medication Instructions:  Your physician recommends that you continue on your current medications as directed. Please refer to the Current Medication list given to you today.   Labwork: NONE  Testing/Procedures: NONE  Follow-Up: Follow up as needed with Dr.Taylor  Any Other Special Instructions Will Be Listed Below (If Applicable).

## 2015-04-25 ENCOUNTER — Ambulatory Visit: Payer: Non-veteran care | Admitting: Internal Medicine

## 2015-05-09 ENCOUNTER — Ambulatory Visit (HOSPITAL_COMMUNITY): Payer: Self-pay | Admitting: *Deleted

## 2015-05-09 ENCOUNTER — Other Ambulatory Visit (HOSPITAL_COMMUNITY): Payer: Self-pay | Admitting: *Deleted

## 2015-05-09 ENCOUNTER — Encounter (HOSPITAL_COMMUNITY): Payer: Self-pay | Admitting: *Deleted

## 2015-05-09 ENCOUNTER — Ambulatory Visit (HOSPITAL_COMMUNITY)
Admission: RE | Admit: 2015-05-09 | Discharge: 2015-05-09 | Disposition: A | Discharge status: Home / Self Care | Payer: Non-veteran care | Source: Ambulatory Visit | Attending: Internal Medicine | Admitting: Internal Medicine

## 2015-05-09 VITALS — BP 120/0 | HR 83 | Ht 73.0 in | Wt 224.2 lb

## 2015-05-09 DIAGNOSIS — I480 Paroxysmal atrial fibrillation: Secondary | ICD-10-CM | POA: Insufficient documentation

## 2015-05-09 DIAGNOSIS — E039 Hypothyroidism, unspecified: Secondary | ICD-10-CM | POA: Insufficient documentation

## 2015-05-09 DIAGNOSIS — I252 Old myocardial infarction: Secondary | ICD-10-CM | POA: Insufficient documentation

## 2015-05-09 DIAGNOSIS — F329 Major depressive disorder, single episode, unspecified: Secondary | ICD-10-CM | POA: Insufficient documentation

## 2015-05-09 DIAGNOSIS — R0600 Dyspnea, unspecified: Secondary | ICD-10-CM

## 2015-05-09 DIAGNOSIS — Z87891 Personal history of nicotine dependence: Secondary | ICD-10-CM | POA: Insufficient documentation

## 2015-05-09 DIAGNOSIS — I10 Essential (primary) hypertension: Secondary | ICD-10-CM | POA: Diagnosis not present

## 2015-05-09 DIAGNOSIS — I5022 Chronic systolic (congestive) heart failure: Secondary | ICD-10-CM | POA: Insufficient documentation

## 2015-05-09 DIAGNOSIS — Z95811 Presence of heart assist device: Secondary | ICD-10-CM

## 2015-05-09 DIAGNOSIS — J449 Chronic obstructive pulmonary disease, unspecified: Secondary | ICD-10-CM | POA: Diagnosis not present

## 2015-05-09 DIAGNOSIS — R0902 Hypoxemia: Secondary | ICD-10-CM | POA: Insufficient documentation

## 2015-05-09 DIAGNOSIS — G4733 Obstructive sleep apnea (adult) (pediatric): Secondary | ICD-10-CM | POA: Insufficient documentation

## 2015-05-09 DIAGNOSIS — I2510 Atherosclerotic heart disease of native coronary artery without angina pectoris: Secondary | ICD-10-CM | POA: Insufficient documentation

## 2015-05-09 DIAGNOSIS — Z7901 Long term (current) use of anticoagulants: Secondary | ICD-10-CM | POA: Insufficient documentation

## 2015-05-09 DIAGNOSIS — Z79899 Other long term (current) drug therapy: Secondary | ICD-10-CM | POA: Insufficient documentation

## 2015-05-09 DIAGNOSIS — E785 Hyperlipidemia, unspecified: Secondary | ICD-10-CM | POA: Diagnosis not present

## 2015-05-09 DIAGNOSIS — Z882 Allergy status to sulfonamides status: Secondary | ICD-10-CM | POA: Diagnosis not present

## 2015-05-09 LAB — CBC
HEMATOCRIT: 43.5 % (ref 39.0–52.0)
HEMOGLOBIN: 14 g/dL (ref 13.0–17.0)
MCH: 31.3 pg (ref 26.0–34.0)
MCHC: 32.2 g/dL (ref 30.0–36.0)
MCV: 97.1 fL (ref 78.0–100.0)
Platelets: 111 10*3/uL — ABNORMAL LOW (ref 150–400)
RBC: 4.48 MIL/uL (ref 4.22–5.81)
RDW: 18 % — ABNORMAL HIGH (ref 11.5–15.5)
WBC: 6.8 10*3/uL (ref 4.0–10.5)

## 2015-05-09 LAB — BASIC METABOLIC PANEL
ANION GAP: 9 (ref 5–15)
BUN: 14 mg/dL (ref 6–20)
CHLORIDE: 106 mmol/L (ref 101–111)
CO2: 25 mmol/L (ref 22–32)
Calcium: 9.9 mg/dL (ref 8.9–10.3)
Creatinine, Ser: 1.04 mg/dL (ref 0.61–1.24)
GFR calc Af Amer: >60 mL/min (ref >60)
GFR calc non Af Amer: >60 mL/min (ref >60)
Glucose, Bld: 93 mg/dL (ref 65–99)
POTASSIUM: 4.3 mmol/L (ref 3.5–5.1)
Sodium: 140 mmol/L (ref 135–145)

## 2015-05-09 LAB — PROTIME-INR
INR: 2.17 — AB (ref 0.00–1.49)
Prothrombin Time: 24 seconds — ABNORMAL HIGH (ref 11.6–15.2)

## 2015-05-09 LAB — LACTATE DEHYDROGENASE: LDH: 335 U/L — ABNORMAL HIGH (ref 98–192)

## 2015-05-09 LAB — BRAIN NATRIURETIC PEPTIDE: B Natriuretic Peptide: 303.3 pg/mL — ABNORMAL HIGH (ref 0.0–100.0)

## 2015-05-09 NOTE — Progress Notes (Signed)
Patient ID: [name], male   DOB: 10-31-1940, 74 y.o.   MRN: [id number]  [facility] [place] (Dr. 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 Quit smoking 2003. Uses CPAP and O2 every night. He is s/p LVAD HM II implanted 01/12/13 under DT criteria  Admitted to [facility] 09/13/13 - 09/14/13 for increased fatigue and dyspnea. Found to have mild CHF and anemia. Diuresed with IV lasix and transitioned to PO lasix 40 mg twice a week. Discharge weight 203 lbs on inpatient scale.  Admitting labs revealed Hgb 7.7 for which he received 2 units PCs with appropriate rise in Hgb.   Admit 6/8-6/12 for symptomatic anemia. Had EGD/Colonoscopy which was unrevealing.  6 mm polyp clipped and removed. CT abdomen showed cirrhosis. Placed on heparin bridge. Capsule study pending. Sent home on lovenox. D/C weight 206 lbs.   Admitted to [facility] with with GI bleed. GI consulted. Push enteroscopy 10/28 negative. Colonoscopy 10/29 without source of bleeding.Result of capsule endoscopy--> Showed bleeding from duodenum. EGD--->Bleeding from duodenum with clip applied. Overall he received  10 UPRBCs. Discharge weight was 207 pounds.   Admitted to [facility] 09/21/14 with GI bleed . GI consulted. Enteroscopy showed bleed from jejunum requiring 3 clips. Overall he received 4 units PRBCs.  He went back in A fib and had DC-CV.  On the day discharge he was back in NSR on amiodarone 200 mg twice a day. Discharge weight was 214 pounds.   Admitted in 6/16 for ICD extraction due to pocket infection. Did well. No complications. No bleeding from site. Feels good. Still with some chronic DOE but no change. No edema. Does not take lasix. Compliant with all meds.    He returns for follow up. Feels very good. Relieved to have gotten through ICD extraction.  Denies any SOB, edema, orthopnea or PND, Weight stable. Taking all medications as prescribed. He is concerned because his wife admitted with CP. She is getting stress test today.    Denies LVAD alarms. Denies driveline trauma, erythema or drainage.  Denies ICD shocks. Reports taking Coumadin as prescribed and adherence to anticoagulation based dietary restrictions. Denies bright red blood per rectum or melena, no dark urine or hematuria.   Labs (2/16): LDL 305, HCT 38.9 Labs (3/16): K 3.9, creatinine 1.05, LFTs normal   Past Medical History  Diagnosis Date  . Ischemic cardiomyopathy      CABG 2003, PCI 2007  EF 27%(myoview 2012  . Chronic systolic heart failure   . Hyperlipidemia   . Hypothyroidism   . Chronic anticoagulation     Afib and LVAD  . Obesity   . COPD (chronic obstructive pulmonary disease)   . Asbestosis(501)     "6 years in the [place]" (05/24/2013)  . Atrial fibrillation     permanent  . Paroxysmal ventricular tachycardia   . Coronary artery disease   . Implantable cardioverter-defibrillator Medtronic   . Hypertension   . Myocardial infarction 1990's-2000    "2 in  ~ the 1990's; 1 in ~ 2000" (05/24/2013)  . OSA on CPAP   . Depression   . LVAD (left ventricular assist device) present 12/2012    Current Outpatient Prescriptions  Medication Sig Dispense Refill  . amiodarone (PACERONE) 200 MG tablet Take 1 tablet (200 mg total) by mouth daily. 90 tablet 3  .  budesonide-formoterol (SYMBICORT) 160-4.5 MCG/ACT inhaler Inhale 2 puffs into the lungs 2 (two) times daily. 1 Inhaler 3  . cholecalciferol (VITAMIN D) 1000 UNITS tablet Take 1 tablet (1,000 Units total) by mouth 2 (two) times daily. 180 tablet 3  . citalopram (CELEXA) 40 MG tablet Take 1 tablet (40 mg total) by mouth at bedtime. 90 tablet 3  . docusate sodium (COLACE) 100 MG capsule Take 200 mg by mouth 2 (two) times daily.    . furosemide (LASIX) 40 MG tablet Take 40 mg by mouth as needed for fluid.    [name]  levothyroxine (SYNTHROID, LEVOTHROID) 125 MCG tablet Take 125 mcg by mouth daily before breakfast.    . losartan (COZAAR) 50 MG tablet Take 1.5 tablets (75 mg total) by mouth at bedtime. 135 tablet 3  . Multiple Vitamins-Minerals (MULTIVITAMIN PO) Take 1 tablet by mouth at bedtime.     . pantoprazole (PROTONIX) 40 MG tablet Take 1 tablet (40 mg total) by mouth 2 (two) times daily. 180 tablet 3  . potassium chloride (K-DUR) 10 MEQ tablet Take 10 mEq by mouth as needed (when taking furosemide).    . simvastatin (ZOCOR) 80 MG tablet Take 0.5 tablets (40 mg total) by mouth at bedtime. 45 tablet 3  . tiotropium (SPIRIVA) 18 MCG inhalation capsule Place 1 capsule (18 mcg total) into inhaler and inhale daily. 90 capsule 3  . vitamin B-12 (CYANOCOBALAMIN) 1000 MCG tablet Take 1,000 mcg by mouth at bedtime.    . vitamin C (ASCORBIC ACID) 500 MG tablet Take 1 tablet (500 mg total) by mouth 2 (two) times daily. 180 tablet 3  . warfarin (COUMADIN) 5 MG tablet Take 2.5 mg on Mon and Fri. All other days take 5 mg . On  6/21 take 2.5 mg. . 200 tablet 3  . nitroGLYCERIN (NITROSTAT) 0.4 MG SL tablet Place 0.4 mg under the tongue every 5 (five) minutes as needed for chest pain.     No current facility-administered medications for this encounter.    Lescol; Persantine; Sulfur; Zetia; and Zocor  REVIEW OF SYSTEMS: All systems negative except as listed in HPI, PMH and Problem list.   Filed Vitals:   05/09/15 1030 05/09/15 1031  BP: 118/60 120/0  Pulse: 83   Height: 6\' 1"  (1.854 m)   Weight: 224 lb 3.2 oz (101.696 kg)   SpO2: 91%        LVAD interrogation reveals:   LVAD interrogation reveals:  Speed: 9190 Flow: 4.6 Power: 5.2 PI: 7.0 Alarms: none Events: Rare PI  Fixed speed: 9200 Low speed limit: 8600 Primary Controller: Replace back up battery in 11 months (January 2017) Back up controller: Replace back up battery in 11 months (January 2017)  Physical Exam:  HR:  83 Doppler MAP: 120 Auto cuff: 118/60 (92) O2 Sat: 91% Wt: 224.2 lbs Last clinic wt:: 225 Lbs Ht: 6'1"  GENERAL: Well appearing, male; NAD; wife present HEENT: normal  NECK: Supple, JVP 6 no bruits.  No lymphadenopathy or thyromegaly appreciated.   CARDIAC: Mechanical heart sounds with LVAD hum present. L upper chest wound looks good LUNGS:  Clear to auscultation bilaterally.  ABDOMEN:  Obese, Soft, round, nontender, positive bowel sounds x4.     LVAD exit site: well-healed and incorporated.  Dressing dry and intact.  No erythema, drainage, odor or tenderness.  Stabilization device present and accurately applied.  Driveline dressing is being changed daily per sterile technique. Changed in clinic EXTREMITIES:  Warm and dry, no cyanosis, clubbing, no edema  NEUROLOGIC:  Alert and oriented x 4.  Gait steady.  No aphasia.  No dysarthria.  Affect pleasant.     ASSESSMENT AND PLAN:   1) Chronic Systolic HF: s/p LVAD implant 12/2012 for DT. - Doing well. NYHA II. Volume status stable. MAPs slightly elevated today in setting over stress over his wife's admission to the hospital. Will not adjust meds.  - Not on BB due to previous bradycardia.  . Continue current losartan.   - Reinforced the need and importance of daily weights, a low sodium diet, and fluid restriction (less than 2 L a day). Instructed to call the HF clinic if weight increases more than 3 lbs overnight or 5 lbs in a week.  2)  Anticoagulation management: INR goal 1.8-2.5 with history of GI bleeding.  He is not on ASA because of bleeding.   Continue coumadin See anticoagulation flow sheet.  3) Anemia/ GIB: Most recent GI bleed 09/21/14. Had bleeding noted in jejunum requiring 3 clips.  Continue PPI and stool softener. No BRBPR.  CBC today Hgb 13.1   4 LVAD: No issues. All parameters within normal limits.  Continue weekly dressing changes. Admit next week for ICD extraction. Stop coumadin the day before.  - Check CBC, BMET, LDH  and INR today.   5. PAF: Paroxysmal.  15% burden AF on 2/16 event monitor.  By exam regular rate rhythm.    Continue amiodarone 200 mg daily, recent LFTs and TSH normal.  Should have yearly eye exam.   6. Depression: Stable.  Continue Celexa 40 mg daily 7. OSA: Using CPAP.  8. Hypoxemia:  Uses home oxygen at night.  This may be due to his baseline COPD. 9. ICD infeciton - s/p extraction 6/16. No complications.   Glori Bickers MD  05/09/2015

## 2015-05-09 NOTE — Progress Notes (Signed)
Symptom  Yes  No  Details   Angina         x Activity:   Claudication                x How far:  Syncope         x When:   Stroke         x   Orthopnea         x How many pillows:   2 for comfort  PND         x How often:  CPAP         x  How many hrs:  All night  Pedal edema         x   Abd fullness         x   N&V         x   Diaphoresis         x When:  Bleeding               x   Urine color    medium yellow  SOB        x  Activity:  Incline; one block level ground   Palpitations         x When:  ICD shock      N/A ICD extracted  Hospitlizaitons        x        When/where/why:  6/15 - 03/21/15 ICD lead extraction   ED visit         x When/where/why:  Other MD              x When/who/why:    Activity    No formal activity; sedentary  Fluid    No limitations  Diet    No limitations   Vital signs: HR:  83 Doppler MAP:  120 Auto cuff:  118/60 (92) O2 Sat: 91% Wt:  224.2  lbs Last clinic wt::  225  Lbs Ht: 6'1"  LVAD interrogation reveals:  Speed: 9190 Flow:  4.6 Power:  5.2 PI:  7.0 Alarms: none Events:  Rare PI        Fixed speed:  9200 Low speed limit: 8600 Primary Controller:  Replace back up battery in 11 months (January 2017) Back up controller:   Replace back up battery in 11 months (January 2017)  LVAD exit site:  Well healed and incorporated. The velour is fully implanted at exit site. Dressing dry and intact. No erythema or drainage. Stabilization device present and accurately applied. Driveline dressing is being changed weekly per sterile technique using Sorbaview dressing with biopatch on exit site. Pt denies fever or chills. Dressing supplies provided to patient/caregiver.  Pt denies any alarms or VAD equipment issues.Back-up equipment present.    Pt presents to VAD clinic without wife, states she was admitted yesterday to r/o MI. He is unsure of current meds, produced med list which was reviewed. Only notable difference was increased synthroid dose of 125  mcg daily and no amiodarone dose at all.  Pt does think he is taking amiodarone "1 pill daily now instead of 2 pills daily", but unsure of Synthroid dose. Will f/u with his wife when she is available.  Patient Instructions:  1. No change in coumadin dose; will re-check INR on May 23, 2015 3.  No change in meds. 4.  Return to [place] clinic in two months.

## 2015-05-09 NOTE — Patient Instructions (Signed)
  1. No change in coumadin dose; will re-check INR on May 23, 2015 3.  No change in meds. 4.  Return to [place] clinic in two months.

## 2015-05-10 ENCOUNTER — Telehealth (HOSPITAL_COMMUNITY): Payer: Self-pay | Admitting: Infectious Diseases

## 2015-05-10 NOTE — Telephone Encounter (Signed)
Page received from Marilynn Latino about hearing a "low beep" on the VAD while out and about in [place]. He changed his batteries recently and heard the low beep, one that they "never have heard before". Had them read current parameters to me and were WNL for patient's baseline, self-test was performed while I was on the phone with them to identify beep however couldn't replicate it. No lights aside from the [employer] are currently on with the controller. Denies any yellow diamond or red battery lights/alarms. Had her do a review of last 6 alarms/advisories and showed normal "battery disconnect" records x 6. Battery power has 4 bars to both batteries. No longer has ICD.   Instructed to continue to monitor, as they were out and about it could have been another noise. If they were to hear the noise again I advised them to come to clinic to allow me to look at equipment. Appears to be functioning as expected .

## 2015-05-22 ENCOUNTER — Other Ambulatory Visit (INDEPENDENT_AMBULATORY_CARE_PROVIDER_SITE_OTHER): Payer: Non-veteran care

## 2015-05-22 ENCOUNTER — Ambulatory Visit (HOSPITAL_COMMUNITY): Payer: Self-pay | Admitting: Infectious Diseases

## 2015-05-22 DIAGNOSIS — Z7901 Long term (current) use of anticoagulants: Secondary | ICD-10-CM

## 2015-05-22 DIAGNOSIS — I480 Paroxysmal atrial fibrillation: Secondary | ICD-10-CM

## 2015-05-22 DIAGNOSIS — Z95811 Presence of heart assist device: Secondary | ICD-10-CM

## 2015-05-22 DIAGNOSIS — D509 Iron deficiency anemia, unspecified: Secondary | ICD-10-CM

## 2015-05-22 LAB — PROTIME-INR
INR: 2.4 ratio — ABNORMAL HIGH (ref 0.8–1.0)
Prothrombin Time: 26.1 s — ABNORMAL HIGH (ref 9.6–13.1)

## 2015-05-23 ENCOUNTER — Other Ambulatory Visit: Payer: Non-veteran care

## 2015-05-25 ENCOUNTER — Other Ambulatory Visit (HOSPITAL_COMMUNITY): Payer: Self-pay | Admitting: Infectious Diseases

## 2015-05-25 DIAGNOSIS — Z7901 Long term (current) use of anticoagulants: Secondary | ICD-10-CM

## 2015-05-25 DIAGNOSIS — Z95811 Presence of heart assist device: Secondary | ICD-10-CM

## 2015-05-31 ENCOUNTER — Telehealth (HOSPITAL_COMMUNITY): Payer: Self-pay | Admitting: *Deleted

## 2015-05-31 DIAGNOSIS — Z95811 Presence of heart assist device: Secondary | ICD-10-CM

## 2015-05-31 DIAGNOSIS — D500 Iron deficiency anemia secondary to blood loss (chronic): Secondary | ICD-10-CM

## 2015-05-31 DIAGNOSIS — Z7901 Long term (current) use of anticoagulants: Secondary | ICD-10-CM

## 2015-05-31 NOTE — Telephone Encounter (Signed)
Wife called asking if labs can be rescheduled to Thursday of next week; pt needs INR, CBC w diff, and Ferretin at [facility] lab, re-scheduled to Thursday 05/08/15 per wife's request.

## 2015-06-01 ENCOUNTER — Telehealth: Payer: Self-pay | Admitting: Hematology

## 2015-06-01 NOTE — Telephone Encounter (Signed)
pt cld to cx labs stated will have them drawn @ [facility]

## 2015-06-08 ENCOUNTER — Other Ambulatory Visit (INDEPENDENT_AMBULATORY_CARE_PROVIDER_SITE_OTHER): Payer: No Typology Code available for payment source

## 2015-06-08 DIAGNOSIS — Z7901 Long term (current) use of anticoagulants: Secondary | ICD-10-CM

## 2015-06-08 DIAGNOSIS — D500 Iron deficiency anemia secondary to blood loss (chronic): Secondary | ICD-10-CM

## 2015-06-08 DIAGNOSIS — Z95811 Presence of heart assist device: Secondary | ICD-10-CM

## 2015-06-08 LAB — CBC WITH DIFFERENTIAL/PLATELET
BASOS ABS: 0 10*3/uL (ref 0.0–0.1)
Basophils Relative: 0.3 % (ref 0.0–3.0)
Eosinophils Absolute: 0.2 10*3/uL (ref 0.0–0.7)
Eosinophils Relative: 3.3 % (ref 0.0–5.0)
HCT: 39.6 % (ref 39.0–52.0)
Hemoglobin: 13.2 g/dL (ref 13.0–17.0)
Lymphocytes Relative: 11.8 % — ABNORMAL LOW (ref 12.0–46.0)
Lymphs Abs: 0.7 10*3/uL (ref 0.7–4.0)
MCHC: 33.3 g/dL (ref 30.0–36.0)
MCV: 95.3 fl (ref 78.0–100.0)
MONO ABS: 0.4 10*3/uL (ref 0.1–1.0)
MONOS PCT: 6.9 % (ref 3.0–12.0)
Neutro Abs: 4.9 10*3/uL (ref 1.4–7.7)
Neutrophils Relative %: 77.7 % — ABNORMAL HIGH (ref 43.0–77.0)
Platelets: 120 10*3/uL — ABNORMAL LOW (ref 150.0–400.0)
RBC: 4.15 Mil/uL — AB (ref 4.22–5.81)
RDW: 17.4 % — ABNORMAL HIGH (ref 11.5–15.5)
WBC: 6.3 10*3/uL (ref 4.0–10.5)

## 2015-06-08 LAB — PROTIME-INR
INR: 1.5 ratio — AB (ref 0.8–1.0)
PROTHROMBIN TIME: 16.2 s — AB (ref 9.6–13.1)

## 2015-06-08 LAB — FERRITIN: FERRITIN: 70.4 ng/mL (ref 22.0–322.0)

## 2015-06-09 ENCOUNTER — Other Ambulatory Visit: Payer: Medicare Other

## 2015-06-09 ENCOUNTER — Ambulatory Visit (HOSPITAL_COMMUNITY): Payer: Self-pay | Admitting: Infectious Diseases

## 2015-06-09 DIAGNOSIS — Z5181 Encounter for therapeutic drug level monitoring: Secondary | ICD-10-CM

## 2015-06-09 DIAGNOSIS — Z95811 Presence of heart assist device: Secondary | ICD-10-CM

## 2015-06-13 ENCOUNTER — Other Ambulatory Visit: Payer: Medicare Other

## 2015-06-13 ENCOUNTER — Telehealth: Payer: Self-pay | Admitting: *Deleted

## 2015-06-13 NOTE — Telephone Encounter (Signed)
Spouse called on Gahel's behalf "asking if Dr. Burr Medico is monitoring Martell's lab results.  Scheduled for Feraheme on Thursday.  He always has labs drawn at [facility].  06-08-2015 Ferritin level = 70."  Patient seen on 02-07-2015 which the plan is for Feraheme every two months for ferritin less than 100.  Patient will be here 06-15-2015 at 0830 for Feraheme.

## 2015-06-15 ENCOUNTER — Other Ambulatory Visit (INDEPENDENT_AMBULATORY_CARE_PROVIDER_SITE_OTHER): Payer: No Typology Code available for payment source

## 2015-06-15 ENCOUNTER — Ambulatory Visit (HOSPITAL_BASED_OUTPATIENT_CLINIC_OR_DEPARTMENT_OTHER): Payer: Medicare Other

## 2015-06-15 ENCOUNTER — Ambulatory Visit (HOSPITAL_COMMUNITY): Payer: Self-pay | Admitting: Infectious Diseases

## 2015-06-15 VITALS — BP 105/75 | HR 65 | Temp 98.0°F | Resp 16

## 2015-06-15 DIAGNOSIS — Z5181 Encounter for therapeutic drug level monitoring: Secondary | ICD-10-CM

## 2015-06-15 DIAGNOSIS — D62 Acute posthemorrhagic anemia: Secondary | ICD-10-CM | POA: Diagnosis not present

## 2015-06-15 DIAGNOSIS — Z95811 Presence of heart assist device: Secondary | ICD-10-CM | POA: Diagnosis not present

## 2015-06-15 LAB — PROTIME-INR
INR: 3.8 ratio — ABNORMAL HIGH (ref 0.8–1.0)
Prothrombin Time: 40.5 s — ABNORMAL HIGH (ref 9.6–13.1)

## 2015-06-15 MED ORDER — SODIUM CHLORIDE 0.9 % IV SOLN
510.0000 mg | Freq: Once | INTRAVENOUS | Status: AC
  Administered 2015-06-15: 510 mg via INTRAVENOUS
  Filled 2015-06-15: qty 17

## 2015-06-15 MED ORDER — SODIUM CHLORIDE 0.9 % IV SOLN
Freq: Once | INTRAVENOUS | Status: AC
  Administered 2015-06-15: 08:00:00 via INTRAVENOUS

## 2015-06-15 NOTE — Patient Instructions (Signed)

## 2015-06-16 ENCOUNTER — Ambulatory Visit: Payer: Medicare Other

## 2015-06-21 ENCOUNTER — Telehealth: Payer: Self-pay | Admitting: Licensed Clinical Social Worker

## 2015-06-21 NOTE — Telephone Encounter (Signed)
CSW contacted patient's wife to offer support as they have not attended support group in two months. Wife states that she had a fall and is recovering and unable to sit for long time without being uncomfortable. Patient's wife spoke at length and stated she is managing fine and hopeful to make the next support group meeting in October. CSW shared members of the support group asked for her and would like to call her to give support. Patient's wife open to contact from support group members. CSW will continue to follow for support as needed. Alexis Frock [phone]

## 2015-06-21 NOTE — Telephone Encounter (Signed)
Entered in error

## 2015-06-22 ENCOUNTER — Ambulatory Visit (HOSPITAL_COMMUNITY): Payer: Self-pay | Admitting: Infectious Diseases

## 2015-06-22 ENCOUNTER — Ambulatory Visit (HOSPITAL_BASED_OUTPATIENT_CLINIC_OR_DEPARTMENT_OTHER): Payer: Non-veteran care

## 2015-06-22 ENCOUNTER — Other Ambulatory Visit (INDEPENDENT_AMBULATORY_CARE_PROVIDER_SITE_OTHER): Payer: Medicare Other

## 2015-06-22 VITALS — BP 108/75 | HR 67 | Temp 97.7°F | Resp 17

## 2015-06-22 DIAGNOSIS — I472 Ventricular tachycardia, unspecified: Secondary | ICD-10-CM

## 2015-06-22 DIAGNOSIS — I482 Chronic atrial fibrillation, unspecified: Secondary | ICD-10-CM

## 2015-06-22 DIAGNOSIS — I5022 Chronic systolic (congestive) heart failure: Secondary | ICD-10-CM

## 2015-06-22 DIAGNOSIS — D62 Acute posthemorrhagic anemia: Secondary | ICD-10-CM | POA: Diagnosis not present

## 2015-06-22 DIAGNOSIS — Z95811 Presence of heart assist device: Secondary | ICD-10-CM

## 2015-06-22 LAB — PROTIME-INR
INR: 2.2 ratio — ABNORMAL HIGH (ref 0.8–1.0)
Prothrombin Time: 24.4 s — ABNORMAL HIGH (ref 9.6–13.1)

## 2015-06-22 MED ORDER — SODIUM CHLORIDE 0.9 % IV SOLN
Freq: Once | INTRAVENOUS | Status: AC
  Administered 2015-06-22: 12:00:00 via INTRAVENOUS

## 2015-06-22 MED ORDER — SODIUM CHLORIDE 0.9 % IV SOLN
510.0000 mg | Freq: Once | INTRAVENOUS | Status: AC
  Administered 2015-06-22: 510 mg via INTRAVENOUS
  Filled 2015-06-22: qty 17

## 2015-06-22 NOTE — Patient Instructions (Signed)

## 2015-07-04 ENCOUNTER — Other Ambulatory Visit (HOSPITAL_COMMUNITY): Payer: Self-pay | Admitting: *Deleted

## 2015-07-04 ENCOUNTER — Ambulatory Visit (HOSPITAL_COMMUNITY): Payer: Self-pay | Admitting: *Deleted

## 2015-07-04 ENCOUNTER — Ambulatory Visit (HOSPITAL_COMMUNITY)
Admission: RE | Admit: 2015-07-04 | Discharge: 2015-07-04 | Disposition: A | Discharge status: Home / Self Care | Payer: Non-veteran care | Source: Ambulatory Visit | Attending: Internal Medicine | Admitting: Internal Medicine

## 2015-07-04 VITALS — BP 92/0 | HR 62 | Ht 73.0 in | Wt 226.4 lb

## 2015-07-04 DIAGNOSIS — Z95811 Presence of heart assist device: Secondary | ICD-10-CM

## 2015-07-04 DIAGNOSIS — Z7901 Long term (current) use of anticoagulants: Secondary | ICD-10-CM | POA: Insufficient documentation

## 2015-07-04 DIAGNOSIS — I5022 Chronic systolic (congestive) heart failure: Secondary | ICD-10-CM | POA: Diagnosis not present

## 2015-07-04 DIAGNOSIS — M79605 Pain in left leg: Secondary | ICD-10-CM

## 2015-07-04 DIAGNOSIS — Z79899 Other long term (current) drug therapy: Secondary | ICD-10-CM | POA: Diagnosis not present

## 2015-07-04 DIAGNOSIS — Z48812 Encounter for surgical aftercare following surgery on the circulatory system: Secondary | ICD-10-CM | POA: Insufficient documentation

## 2015-07-04 DIAGNOSIS — I4891 Unspecified atrial fibrillation: Secondary | ICD-10-CM

## 2015-07-04 LAB — CBC
HEMATOCRIT: 40.9 % (ref 39.0–52.0)
HEMOGLOBIN: 13.3 g/dL (ref 13.0–17.0)
MCH: 32.2 pg (ref 26.0–34.0)
MCHC: 32.5 g/dL (ref 30.0–36.0)
MCV: 99 fL (ref 78.0–100.0)
Platelets: 122 10*3/uL — ABNORMAL LOW (ref 150–400)
RBC: 4.13 MIL/uL — ABNORMAL LOW (ref 4.22–5.81)
RDW: 16.8 % — AB (ref 11.5–15.5)
WBC: 6.8 10*3/uL (ref 4.0–10.5)

## 2015-07-04 LAB — BRAIN NATRIURETIC PEPTIDE: B Natriuretic Peptide: 267.1 pg/mL — ABNORMAL HIGH (ref 0.0–100.0)

## 2015-07-04 LAB — PROTIME-INR
INR: 1.98 — ABNORMAL HIGH (ref 0.00–1.49)
Prothrombin Time: 22.4 seconds — ABNORMAL HIGH (ref 11.6–15.2)

## 2015-07-04 LAB — COMPREHENSIVE METABOLIC PANEL
ALT: 29 U/L (ref 17–63)
AST: 54 U/L — AB (ref 15–41)
Albumin: 3.7 g/dL (ref 3.5–5.0)
Alkaline Phosphatase: 74 U/L (ref 38–126)
Anion gap: 9 (ref 5–15)
BILIRUBIN TOTAL: 0.4 mg/dL (ref 0.3–1.2)
BUN: 15 mg/dL (ref 6–20)
CHLORIDE: 106 mmol/L (ref 101–111)
CO2: 23 mmol/L (ref 22–32)
CREATININE: 1.1 mg/dL (ref 0.61–1.24)
Calcium: 10 mg/dL (ref 8.9–10.3)
GFR calc Af Amer: >60 mL/min (ref >60)
Glucose, Bld: 130 mg/dL — ABNORMAL HIGH (ref 65–99)
Potassium: 4.6 mmol/L (ref 3.5–5.1)
Sodium: 138 mmol/L (ref 135–145)
Total Protein: 6.4 g/dL — ABNORMAL LOW (ref 6.5–8.1)

## 2015-07-04 LAB — TSH: TSH: 3.735 u[IU]/mL (ref 0.350–4.500)

## 2015-07-04 LAB — LACTATE DEHYDROGENASE: LDH: 351 U/L — AB (ref 98–192)

## 2015-07-04 NOTE — Patient Instructions (Signed)
1.  No change in coumadin. Re-check INR in two weeks on 07/18/15. 2.  Return to [place] clinic 2 months.

## 2015-07-04 NOTE — Progress Notes (Signed)
Symptom  Yes  No  Details   Angina         x Activity:   Claudication                x How far:  Left leg "giving out"  Syncope         x When:   Stroke         x   Orthopnea         x How many pillows:   2 for comfort  PND         x How often:  CPAP         x  How many hrs:  All night  Pedal edema         x   Abd fullness         x   N&V         x   Diaphoresis         x        When:  Occasionally night sweats 2 - 3 x month  Bleeding               x   Urine color    medium yellow  SOB        x  Activity:  Incline; one block level ground   Palpitations         x When:  ICD shock         x   Hospitlizaitons               x When/where/why:    ED visit         x When/where/why:  Other MD              x When/who/why:    Activity    No formal activity; sedentary  Fluid    No limitations  Diet    No limitations   Vital signs: HR:  62 Doppler MAP:  92 Auto cuff:  104/71 (79) O2 Sat: 92 Wt:  226.4 lbs Last clinic wt::  225.6 Lbs Home wts:  219 - 220 lbs Ht: 6'1"  LVAD interrogation reveals:  Speed: 9200 Flow:  4.7 Power:  5.2 PI:  6.5 Alarms: none Events: 0 - 5 PI events        Fixed speed:  9200 Low speed limit: 8600 Primary Controller:  Replace back up battery in 9 months (January 2017) Back up controller:   Replace back up battery in 9 months (January 2017)  LVAD exit site:  Well healed and incorporated. The velour is fully implanted at exit site. Dressing dry and intact. No erythema or drainage. Stabilization device present and accurately applied. Driveline dressing is being changed weekly per sterile technique using Sorbaview dressing with biopatch on exit site. Pt denies fever or chills. Dressing supplies provided to patient.   Pt denies any alarms or VAD equipment issues.Back-up equipment present.   2.5 year Intermacs f/u including EQ-5D-3L and KCCQ-12 completed. Pt refused neurocognitive trailmaking: "can't read" and refused 6 min walk based on left leg "giving  out".  Back up controller 11V battery charged during clinic visit.   Pt does not wish to have ABI scheduled at this time; states his left leg pain "isn't that bad". Wife is recovering from fall with fractured back and he is assisting her at this time.   Patient Instructions: 1.  No change in coumadin. Re-check INR in two weeks on 07/18/15. 2.  Return to [place] clinic 2 months.

## 2015-07-05 LAB — T4: T4 TOTAL: 8.5 ug/dL (ref 4.5–12.0)

## 2015-07-15 DIAGNOSIS — M79605 Pain in left leg: Secondary | ICD-10-CM | POA: Insufficient documentation

## 2015-07-15 NOTE — Progress Notes (Signed)
LVAD CLINIC NOTE  Patient ID: [name], male   DOB: [birth year], 74 y.o.   MRN: [id number]  [facility] [place] (Dr. 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 Quit smoking 2003. Uses CPAP and O2 every night. He is s/p LVAD HM II implanted 01/12/13 under DT criteria  Admitted to [facility] 09/13/13 - 09/14/13 for increased fatigue and dyspnea. Found to have mild CHF and anemia. Diuresed with IV lasix and transitioned to PO lasix 40 mg twice a week. Discharge weight 203 lbs on inpatient scale.  Admitting labs revealed Hgb 7.7 for which he received 2 units PCs with appropriate rise in Hgb.   Admit 6/8-6/12 for symptomatic anemia. Had EGD/Colonoscopy which was unrevealing.  6 mm polyp clipped and removed. CT abdomen showed cirrhosis. Placed on heparin bridge. Capsule study pending. Sent home on lovenox. D/C weight 206 lbs.   Admitted to [facility] with with GI bleed. GI consulted. Push enteroscopy 10/28 negative. Colonoscopy 10/29 without source of bleeding.Result of capsule endoscopy--> Showed bleeding from duodenum. EGD--->Bleeding from duodenum with clip applied. Overall he received  10 UPRBCs. Discharge weight was 207 pounds.   Admitted to [facility] 09/21/14 with GI bleed . GI consulted. Enteroscopy showed bleed from jejunum requiring 3 clips. Overall he received 4 units PRBCs.  He went back in A fib and had DC-CV.  On the day discharge he was back in NSR on amiodarone 200 mg twice a day. Discharge weight was 214 pounds.   Admitted in 6/16 for ICD extraction due to pocket infection. Did well. No complications. No bleeding from site. Feels good. Still with some chronic DOE but no change. No edema. Does not take lasix. Compliant with all meds.    He returns for follow up. Feels very good. Denies any  SOB, edema, orthopnea or PND, Weight stable. Taking all medications as prescribed. Only complaint is that left leg gives out. Says it is worse when walking but can happen at any time. No resting pain. He rests a bit and then symptoms improve. No non-healing ulcers or discoloration.    Denies LVAD alarms. Denies driveline trauma, erythema or drainage.  Denies ICD shocks. Reports taking Coumadin as prescribed and adherence to anticoagulation based dietary restrictions. Denies bright red blood per rectum or melena, no dark urine or hematuria.   LVAD interrogation reveals:  Speed: 9200  Flow: 4.7  Power: 5.2  PI: 6.5  Alarms: none  Events: 0 - 5 PI events  Fixed speed: 9200  Low speed limit: 8600  Primary Controller: Replace back up battery in 9 months (January 2017)  Back up controller: Replace back up battery in 9 months (January 2017)  Labs (2/16): LDL 305, HCT 38.9 Labs (3/16): K 3.9, creatinine 1.05, LFTs normal   Past Medical History  Diagnosis Date  . Ischemic cardiomyopathy      CABG 2003, PCI 2007  EF 27%(myoview 2012  . Chronic systolic heart failure   . Hyperlipidemia   . Hypothyroidism   . Chronic anticoagulation     Afib and LVAD  . Obesity   . COPD (chronic obstructive pulmonary disease)   . Asbestosis(501)     "6 years in the [place]" (05/24/2013)  . Atrial fibrillation     permanent  . Paroxysmal ventricular tachycardia   . Coronary artery  disease   . Implantable cardioverter-defibrillator [employer]onic   . Hypertension   . Myocardial infarction 1990's-2000    "2 in  ~ the 1990's; 1 in ~ 2000" (05/24/2013)  . OSA on CPAP   . Depression   . LVAD (left ventricular assist device) present 12/2012    Current Outpatient Prescriptions  Medication Sig Dispense Refill  . amiodarone (PACERONE) 200 MG tablet Take 1 tablet (200 mg total) by mouth daily. 90 tablet 3  . budesonide-formoterol (SYMBICORT) 160-4.5 MCG/ACT inhaler Inhale 2 puffs into the lungs 2 (two) times daily. 1  Inhaler 3  . cholecalciferol (VITAMIN D) 1000 UNITS tablet Take 1 tablet (1,000 Units total) by mouth 2 (two) times daily. 180 tablet 3  . citalopram (CELEXA) 40 MG tablet Take 1 tablet (40 mg total) by mouth at bedtime. 90 tablet 3  . docusate sodium (COLACE) 100 MG capsule Take 200 mg by mouth 2 (two) times daily.    [name] levothyroxine (SYNTHROID, LEVOTHROID) 125 MCG tablet Take 125 mcg by mouth daily before breakfast.    . losartan (COZAAR) 50 MG tablet Take 1.5 tablets (75 mg total) by mouth at bedtime. 135 tablet 3  . Multiple Vitamins-Minerals (MULTIVITAMIN PO) Take 1 tablet by mouth at bedtime.     . pantoprazole (PROTONIX) 40 MG tablet Take 1 tablet (40 mg total) by mouth 2 (two) times daily. 180 tablet 3  . simvastatin (ZOCOR) 80 MG tablet Take 0.5 tablets (40 mg total) by mouth at bedtime. 45 tablet 3  . tiotropium (SPIRIVA) 18 MCG inhalation capsule Place 1 capsule (18 mcg total) into inhaler and inhale daily. 90 capsule 3  . vitamin B-12 (CYANOCOBALAMIN) 1000 MCG tablet Take 1,000 mcg by mouth at bedtime.    . vitamin C (ASCORBIC ACID) 500 MG tablet Take 1 tablet (500 mg total) by mouth 2 (two) times daily. 180 tablet 3  . warfarin (COUMADIN) 5 MG tablet Take 2.5 mg on Mon and Fri. All other days take 5 mg . On  6/21 take 2.5 mg. . 200 tablet 3  . furosemide (LASIX) 40 MG tablet Take 40 mg by mouth as needed for fluid.    . nitroGLYCERIN (NITROSTAT) 0.4 MG SL tablet Place 0.4 mg under the tongue every 5 (five) minutes as needed for chest pain.    . potassium chloride (K-DUR) 10 MEQ tablet Take 10 mEq by mouth as needed (when taking furosemide).     No current facility-administered medications for this encounter.    Lescol; Persantine; Sulfur; Zetia; and Zocor  REVIEW OF SYSTEMS: All systems negative except as listed in HPI, PMH and Problem list.   Filed Vitals:   07/04/15 1209 07/04/15 1210  BP: 104/71 92/0  Pulse: 62   Height: 6\' 1"  (1.854 m)   Weight: 226 lb 6.4 oz (102.694  kg)   SpO2: 94%       HR: 62  Doppler MAP: 92  Auto cuff: 104/71 (79)  O2 Sat: 92  Wt: 226.4 lbs  Last clinic wt:: 225.6 Lbs  Home wts: 219 - 220 lbs  Ht: 6'1"     GENERAL: Well appearing, male; NAD; wife present HEENT: normal  NECK: Supple, JVP 6 no bruits.  No lymphadenopathy or thyromegaly appreciated.   CARDIAC: Mechanical heart sounds with LVAD hum present. L upper chest wound looks good LUNGS:  Clear to auscultation bilaterally.  ABDOMEN:  Obese, Soft, round, nontender, positive bowel sounds x4.     LVAD exit site: well-healed and incorporated.  Dressing  dry and intact.  No erythema, drainage, odor or tenderness.  Stabilization device present and accurately applied.  Driveline dressing is being changed daily per sterile technique. Changed in clinic EXTREMITIES:  Warm and dry, no cyanosis, clubbing, no edema NEUROLOGIC:  Alert and oriented x 4.  Gait steady.  No aphasia.  No dysarthria.  Affect pleasant.     ASSESSMENT AND PLAN:   1) Chronic Systolic HF: s/p LVAD implant 12/2012 for DT. - Doing well. NYHA II. Volume status stable. MAPs slightly elevated today but overall stable - Not on BB due to previous bradycardia.  . Continue current losartan.   - Reinforced the need and importance of daily weights, a low sodium diet, and fluid restriction (less than 2 L a day). Instructed to call the HF clinic if weight increases more than 3 lbs overnight or 5 lbs in a week.  2)  Anticoagulation management: INR goal 1.8-2.5 with history of GI bleeding.  He is not on ASA because of bleeding.   Continue coumadin See anticoagulation flow sheet.  3) Anemia/ GIB: Most recent GI bleed 09/21/14. Had bleeding noted in jejunum requiring 3 clips.  Continue PPI and stool softener. No BRBPR.  CBC today Hgb 13.1   4 LVAD: No issues. All parameters within normal limits.  Continue weekly dressing changes.   - Check CBC, BMET, LDH and INR today.   5. PAF: Paroxysmal.  15% burden AF on 2/16 event  monitor.  By exam regular rate rhythm.    Continue amiodarone 200 mg daily, recent LFTs and TSH normal.  Should have yearly eye exam.   6. Depression: Stable.  Continue Celexa 40 mg daily 7. OSA: Using CPAP.  8. Hypoxemia:  Uses home oxygen at night.  This may be due to his baseline COPD. 9. ICD infeciton - s/p extraction 6/16. No complications.  10. LLE pain - symptoms suspicious for claudication but flow normal on exam. No evidence of ischemia. Pre-op ABIs mild PAD. He refuses f/u ABIs at this time.   Glori Bickers MD  07/15/2015

## 2015-07-18 ENCOUNTER — Other Ambulatory Visit (HOSPITAL_COMMUNITY): Payer: Self-pay | Admitting: Infectious Diseases

## 2015-07-18 ENCOUNTER — Ambulatory Visit (HOSPITAL_COMMUNITY)
Admission: RE | Admit: 2015-07-18 | Discharge: 2015-07-18 | Disposition: A | Discharge status: Home / Self Care | Payer: Medicare Other | Source: Ambulatory Visit | Attending: Cardiology | Admitting: Cardiology

## 2015-07-18 ENCOUNTER — Encounter: Payer: Self-pay | Admitting: Licensed Clinical Social Worker

## 2015-07-18 ENCOUNTER — Ambulatory Visit (HOSPITAL_COMMUNITY): Payer: Self-pay | Admitting: Infectious Diseases

## 2015-07-18 ENCOUNTER — Other Ambulatory Visit (INDEPENDENT_AMBULATORY_CARE_PROVIDER_SITE_OTHER): Payer: Medicare Other

## 2015-07-18 DIAGNOSIS — I5022 Chronic systolic (congestive) heart failure: Secondary | ICD-10-CM

## 2015-07-18 DIAGNOSIS — Z7901 Long term (current) use of anticoagulants: Secondary | ICD-10-CM | POA: Insufficient documentation

## 2015-07-18 DIAGNOSIS — Z5181 Encounter for therapeutic drug level monitoring: Secondary | ICD-10-CM

## 2015-07-18 DIAGNOSIS — I10 Essential (primary) hypertension: Secondary | ICD-10-CM

## 2015-07-18 DIAGNOSIS — D509 Iron deficiency anemia, unspecified: Secondary | ICD-10-CM

## 2015-07-18 DIAGNOSIS — R790 Abnormal level of blood mineral: Secondary | ICD-10-CM

## 2015-07-18 LAB — PROTIME-INR
INR: 1.84 — AB (ref 0.00–1.49)
Prothrombin Time: 21.2 seconds — ABNORMAL HIGH (ref 11.6–15.2)

## 2015-07-18 NOTE — Addendum Note (Signed)
Addended by: Velna Ochs on: 07/18/2015 08:45 AM   Modules accepted: Orders

## 2015-07-18 NOTE — Addendum Note (Signed)
Addended by: Velna Ochs on: 07/18/2015 08:43 AM   Modules accepted: Orders

## 2015-07-18 NOTE — Addendum Note (Signed)
Addended by: Velna Ochs on: 07/18/2015 08:54 AM   Modules accepted: Orders

## 2015-07-18 NOTE — Progress Notes (Signed)
CSW met with patient in the clinic. Patient reports his wife is having surgery tomorrow on her back at [facility]. Patient stated that she has been struggling with her health and is hopeful for surgery to improve her condition. Patient appears to be more independent and has come to the clinic on his own twice in the last month. CSW left voice mail for patient's wife offering support and encouraged patient to call if assistance needed during wife's recovery. Patient verbalized understanding and will follow up as needed. Raquel Sarna, [place]

## 2015-07-18 NOTE — Addendum Note (Signed)
Addended by: Velna Ochs on: 07/18/2015 08:56 AM   Modules accepted: Orders

## 2015-07-19 LAB — CBC WITH DIFFERENTIAL/PLATELET
BASOS ABS: 0 10*3/uL (ref 0.0–0.1)
Basophils Relative: 0 % (ref 0–1)
EOS ABS: 0.2 10*3/uL (ref 0.0–0.7)
EOS PCT: 3 % (ref 0–5)
HCT: 42.2 % (ref 39.0–52.0)
Hemoglobin: 13.6 g/dL (ref 13.0–17.0)
LYMPHS ABS: 0.9 10*3/uL (ref 0.7–4.0)
Lymphocytes Relative: 13 % (ref 12–46)
MCH: 31.7 pg (ref 26.0–34.0)
MCHC: 32.2 g/dL (ref 30.0–36.0)
MCV: 98.4 fL (ref 78.0–100.0)
MPV: 10.9 fL (ref 8.6–12.4)
Monocytes Absolute: 0.4 10*3/uL (ref 0.1–1.0)
Monocytes Relative: 6 % (ref 3–12)
NEUTROS PCT: 78 % — AB (ref 43–77)
Neutro Abs: 5.4 10*3/uL (ref 1.7–7.7)
PLATELETS: 124 10*3/uL — AB (ref 150–400)
RBC: 4.29 MIL/uL (ref 4.22–5.81)
RDW: 16.5 % — AB (ref 11.5–15.5)
WBC: 6.9 10*3/uL (ref 4.0–10.5)

## 2015-07-19 LAB — FERRITIN: Ferritin: 280 ng/mL (ref 22–322)

## 2015-07-24 ENCOUNTER — Telehealth: Payer: Self-pay | Admitting: Hematology

## 2015-07-24 NOTE — Telephone Encounter (Signed)
pt cld to cx lab appt for 11/3-CX appt

## 2015-08-03 ENCOUNTER — Other Ambulatory Visit: Payer: Medicare Other

## 2015-08-04 ENCOUNTER — Ambulatory Visit (HOSPITAL_COMMUNITY)
Admission: RE | Admit: 2015-08-04 | Discharge: 2015-08-04 | Disposition: A | Discharge status: Home / Self Care | Payer: Medicare Other | Source: Ambulatory Visit | Attending: Internal Medicine | Admitting: Internal Medicine

## 2015-08-04 ENCOUNTER — Ambulatory Visit: Payer: Self-pay | Admitting: Infectious Diseases

## 2015-08-04 DIAGNOSIS — I5022 Chronic systolic (congestive) heart failure: Secondary | ICD-10-CM

## 2015-08-04 DIAGNOSIS — Z7901 Long term (current) use of anticoagulants: Secondary | ICD-10-CM

## 2015-08-04 DIAGNOSIS — Z5181 Encounter for therapeutic drug level monitoring: Secondary | ICD-10-CM | POA: Diagnosis not present

## 2015-08-04 DIAGNOSIS — D509 Iron deficiency anemia, unspecified: Secondary | ICD-10-CM | POA: Insufficient documentation

## 2015-08-04 DIAGNOSIS — R790 Abnormal level of blood mineral: Secondary | ICD-10-CM

## 2015-08-04 DIAGNOSIS — R799 Abnormal finding of blood chemistry, unspecified: Secondary | ICD-10-CM | POA: Diagnosis not present

## 2015-08-04 LAB — CBC WITH DIFFERENTIAL/PLATELET
Basophils Absolute: 0 10*3/uL (ref 0.0–0.1)
Basophils Relative: 0 %
Eosinophils Absolute: 0.3 10*3/uL (ref 0.0–0.7)
Eosinophils Relative: 4 %
HEMATOCRIT: 40.3 % (ref 39.0–52.0)
HEMOGLOBIN: 13.1 g/dL (ref 13.0–17.0)
LYMPHS ABS: 1 10*3/uL (ref 0.7–4.0)
Lymphocytes Relative: 13 %
MCH: 32.4 pg (ref 26.0–34.0)
MCHC: 32.5 g/dL (ref 30.0–36.0)
MCV: 99.8 fL (ref 78.0–100.0)
MONO ABS: 0.4 10*3/uL (ref 0.1–1.0)
MONOS PCT: 5 %
NEUTROS ABS: 5.8 10*3/uL (ref 1.7–7.7)
NEUTROS PCT: 78 %
Platelets: 129 10*3/uL — ABNORMAL LOW (ref 150–400)
RBC: 4.04 MIL/uL — ABNORMAL LOW (ref 4.22–5.81)
RDW: 15.8 % — AB (ref 11.5–15.5)
WBC: 7.6 10*3/uL (ref 4.0–10.5)

## 2015-08-04 LAB — PROTIME-INR
INR: 1.96 — ABNORMAL HIGH (ref 0.00–1.49)
Prothrombin Time: 22.2 seconds — ABNORMAL HIGH (ref 11.6–15.2)

## 2015-08-04 LAB — FERRITIN: Ferritin: 116 ng/mL (ref 24–336)

## 2015-08-10 ENCOUNTER — Telehealth: Payer: Self-pay | Admitting: Hematology

## 2015-08-10 ENCOUNTER — Telehealth: Payer: Self-pay | Admitting: *Deleted

## 2015-08-10 ENCOUNTER — Encounter: Payer: Self-pay | Admitting: Hematology

## 2015-08-10 ENCOUNTER — Ambulatory Visit (HOSPITAL_BASED_OUTPATIENT_CLINIC_OR_DEPARTMENT_OTHER): Payer: Medicare Other | Admitting: Hematology

## 2015-08-10 ENCOUNTER — Other Ambulatory Visit: Payer: Self-pay | Admitting: *Deleted

## 2015-08-10 VITALS — BP 94/59 | HR 65 | Resp 18 | Ht 73.0 in | Wt 238.6 lb

## 2015-08-10 DIAGNOSIS — D696 Thrombocytopenia, unspecified: Secondary | ICD-10-CM

## 2015-08-10 DIAGNOSIS — D500 Iron deficiency anemia secondary to blood loss (chronic): Secondary | ICD-10-CM

## 2015-08-10 DIAGNOSIS — D509 Iron deficiency anemia, unspecified: Secondary | ICD-10-CM

## 2015-08-10 DIAGNOSIS — D62 Acute posthemorrhagic anemia: Secondary | ICD-10-CM

## 2015-08-10 NOTE — Telephone Encounter (Signed)
Gave patient avs report and appointments for March 2017.  °

## 2015-08-10 NOTE — Telephone Encounter (Signed)
Faxed order for monthly CBC, Ferritin, iron & TIBC & fax results to Dr Burr Medico @ [phone] per Dr Ernestina Penna instructions to Dr Bensimhon's office @ [phone]

## 2015-08-10 NOTE — Progress Notes (Signed)
[place]  [phone] [phone]  Clinic Follow Up Note   Patient Care Team: Thressa Sheller, MD as PCP - General (Internal Medicine) Jolaine Artist, MD as Attending Physician (Cardiology) Ivin Poot, MD as Attending Physician (Cardiothoracic Surgery) 08/10/2015  CHIEF COMPLAINTS:  Follow up anemia  HISTORY OF PRESENTING ILLNESS (11/01/2014):  Virgina Wood 74 y.o. male is here because of anemia.   He has extensive cardiac history. He had multiple heart attacks in the past 15 years, he had cardiac bypass surgery in 2003, PCI in 2007, long-standing history of , a atrial fibrillationnd was found to have ischemic cardiomyopathy with EF 27% in 2012.  He initially had AICD placed, and finally had LVAD placed in 2014 for his end-stage heart failure. He has been on Coumadin for more than 10 years for the cardiac issues.   He reports he has had anemia for several years, he used to take iron pill. He also received some IV iron a few years ago. In our electronic medical records, his lab showed anemia since 12/2012.   He had 3 episodes of GI bleeding since June of 2015 which are required blood transfusion, last episode was one month ago and he required 4u RBC  at that time. His EGD showed area of bleeding from duodenum all jejunum and he was treated endoscopyly.   He feels well overall. He denies significant chest pain or abdominal discomfort. He has mild dyspnea on moderate exertion. He has mild to moderate fatigue, but is able to function well at home,  does all his ADLs and daily activities.  TREATMENT: IV Feraheme 500 mg as needed, received in 10/2014, 01/2015, 03/2015, 06/2015   INTERIM HISTORY Mr. Polis returns for follow-up. He is clinically doing well, he has been checking his ferritin level at his cardiologist lab monthly, and received IV Feraheme in July and September. He tolerated the infusion very well. His energy level has improved since he  started IV Feraheme, and his anemia has resolved. He is following up with his cutout is closely for his LVAD, and he had his pacemaker removed a few months ago due to the pacemaker pocket infection. His wife recently had back surgery, and he was able to take care of her and do most housework.    MEDICAL HISTORY:  Past Medical History  Diagnosis Date  . Ischemic cardiomyopathy      CABG 2003, PCI 2007  EF 27%(myoview 2012  . Chronic systolic heart failure   . Hyperlipidemia   . Hypothyroidism   . Chronic anticoagulation     Afib and LVAD in 2014   . Obesity   . COPD (chronic obstructive pulmonary disease)   . Asbestosis(501)     "6 years in the [facility]" (05/24/2013)  . Atrial fibrillation     permanent  . Paroxysmal ventricular tachycardia   . Coronary artery disease   . Implantable cardioverter-defibrillator Medtronic   . Hypertension   . Myocardial infarction 1990's-2000    "2 in  ~ the 1990's; 1 in ~ 2000" (05/24/2013)  . OSA on CPAP   . Depression   . LVAD (left ventricular assist device) present 12/2012    SURGICAL HISTORY: Past Surgical History  Procedure Laterality Date  . Coronary artery bypass graft  2003    "?X4" (05/24/2013)  . Cardiac defibrillator placement  2004; ~ 2010  . Insertion of implantable left ventricular assist device N/A 01/12/2013    Procedure: INSERTION OF IMPLANTABLE LEFT VENTRICULAR  ASSIST DEVICE;  Surgeon: Ivin Poot, MD;  Location: [place];  Service: Open Heart Surgery;  Laterality: N/A;   nitric oxide; Redo sternotomy  . Intraoperative transesophageal echocardiogram N/A 01/12/2013    Procedure: INTRAOPERATIVE TRANSESOPHAGEAL ECHOCARDIOGRAM;  Surgeon: Ivin Poot, MD;  Location: [facility] [place];  Service: Open Heart Surgery;  Laterality: N/A;  . Colonoscopy N/A 03/09/2014    Procedure: COLONOSCOPY;  Surgeon: Inda Castle, MD;  Location: [place];  Service: Endoscopy;  Laterality: N/A;  LVAD  patient  . Esophagogastroduodenoscopy N/A 03/09/2014     Procedure: ESOPHAGOGASTRODUODENOSCOPY (EGD);  Surgeon: Inda Castle, MD;  Location: [facility];  Service: Endoscopy;  Laterality: N/A;  . Givens capsule study N/A 03/10/2014    Procedure: GIVENS CAPSULE STUDY;  Surgeon: Inda Castle, MD;  Location: [place];  Service: Endoscopy;  Laterality: N/A;  . Enteroscopy N/A 07/27/2014    Procedure: ENTEROSCOPY;  Surgeon: Gatha Mayer, MD;  Location: [place];  Service: Endoscopy;  Laterality: N/A;  LVAD patient   . Colonoscopy N/A 07/29/2014    Procedure: COLONOSCOPY;  Surgeon: Gatha Mayer, MD;  Location: [facility];  Service: Endoscopy;  Laterality: N/A;  . Givens capsule study N/A 07/30/2014    Procedure: GIVENS CAPSULE STUDY;  Surgeon: Gatha Mayer, MD;  Location: [place];  Service: Endoscopy;  Laterality: N/A;  . Esophagogastroduodenoscopy N/A 08/01/2014    Procedure: ESOPHAGOGASTRODUODENOSCOPY (EGD);  Surgeon: Jerene Bears, MD;  Location: [facility] ENDOSCOPY;  Service: Endoscopy;  Laterality: N/A;  LVAD patient  . Left and right heart catheterization with coronary/graft angiogram  01/07/2013    Procedure: LEFT AND RIGHT HEART CATHETERIZATION WITH Beatrix Fetters;  Surgeon: Jolaine Artist, MD;  Location: [facility] CATH LAB;  Service: Cardiovascular;;  . Enteroscopy N/A 09/27/2014    Procedure: ENTEROSCOPY;  Surgeon: Gatha Mayer, MD;  Location: [facility] ENDOSCOPY;  Service: Endoscopy;  Laterality: N/A;  . Icd lead removal Left 03/16/2015    Procedure: ICD LEAD REMOVAL/EXTRACTION;  Surgeon: Evans Lance, MD;  Location: [place];  Service: Cardiovascular;  Laterality: Left;  PATIENT HAS LVAD  DR. VAN TRIGT TO BACK UP EXTRACTION    SOCIAL HISTORY: Social History   Social History  . Marital Status: Married    Spouse Name: N/A  . Number of Children: N/A  . Years of Education: N/A   Occupational History  . Not on file.   Social History Main Topics  . Smoking status: Former Smoker -- 2.00 packs/day for 45 years    Types:  Cigarettes    Quit date: 11/11/2001  . Smokeless tobacco: Never Used  . Alcohol Use: No     Comment: 05/24/2013 "use to drink beer; hardly nothing since 2003; once in awhile a beer "  . Drug Use: No  . Sexual Activity: No   Other Topics Concern  . Not on file   Social History Narrative    FAMILY HISTORY: Family History  Problem Relation Age of Onset  . Heart attack Mother   . Heart attack Father     ALLERGIES:  has No Known Allergies.  MEDICATIONS:  Current Outpatient Prescriptions  Medication Sig Dispense Refill  . Albuterol (PROVENTIL IN) Inhale 2.5 mg into the lungs every 6 (six) hours as needed.    [name] amiodarone (PACERONE) 200 MG tablet Take 1 tablet (200 mg total) by mouth daily. 90 tablet 3  . budesonide-formoterol (SYMBICORT) 160-4.5 MCG/ACT inhaler Inhale 2 puffs into the lungs 2 (two) times daily. 1 Inhaler 3  .  cholecalciferol (VITAMIN D) 1000 UNITS tablet Take 1 tablet (1,000 Units total) by mouth 2 (two) times daily. 180 tablet 3  . citalopram (CELEXA) 40 MG tablet Take 1 tablet (40 mg total) by mouth at bedtime. 90 tablet 3  . docusate sodium (COLACE) 100 MG capsule Take 200 mg by mouth 2 (two) times daily.    . furosemide (LASIX) 40 MG tablet Take 40 mg by mouth as needed for fluid.    . hydrocortisone (ANUSOL-HC) 2.5 % rectal cream Place 1 application rectally 2 (two) times daily as needed for hemorrhoids or itching.    . levothyroxine (SYNTHROID, LEVOTHROID) 125 MCG tablet Take 125 mcg by mouth daily before breakfast.    . losartan (COZAAR) 50 MG tablet Take 1.5 tablets (75 mg total) by mouth at bedtime. 135 tablet 3  . Multiple Vitamins-Minerals (MULTIVITAMIN PO) Take 1 tablet by mouth at bedtime.     . pantoprazole (PROTONIX) 40 MG tablet Take 1 tablet (40 mg total) by mouth 2 (two) times daily. 180 tablet 3  . potassium chloride (K-DUR) 10 MEQ tablet Take 10 mEq by mouth as needed (when taking furosemide).    . simvastatin (ZOCOR) 80 MG tablet Take 0.5 tablets  (40 mg total) by mouth at bedtime. 45 tablet 3  . tiotropium (SPIRIVA) 18 MCG inhalation capsule Place 1 capsule (18 mcg total) into inhaler and inhale daily. 90 capsule 3  . vitamin B-12 (CYANOCOBALAMIN) 1000 MCG tablet Take 1,000 mcg by mouth at bedtime.    . vitamin C (ASCORBIC ACID) 500 MG tablet Take 1 tablet (500 mg total) by mouth 2 (two) times daily. 180 tablet 3  . warfarin (COUMADIN) 5 MG tablet Take 2.5 mg on Mon and Fri. All other days take 5 mg . On  6/21 take 2.5 mg. . 200 tablet 3  . nitroGLYCERIN (NITROSTAT) 0.4 MG SL tablet Place 0.4 mg under the tongue every 5 (five) minutes as needed for chest pain.     No current facility-administered medications for this visit.    REVIEW OF SYSTEMS:   Constitutional: Denies fevers, chills or abnormal night sweats Eyes: Denies blurriness of vision, double vision or watery eyes Ears, nose, mouth, throat, and face: Denies mucositis or sore throat Respiratory: Denies cough, dyspnea or wheezes Cardiovascular: Denies palpitation, chest discomfort or lower extremity swelling Gastrointestinal:  Denies nausea, heartburn or change in bowel habits Skin: Denies abnormal skin rashes Lymphatics: Denies new lymphadenopathy or easy bruising Neurological:Denies numbness, tingling or new weaknesses Behavioral/Psych: Mood is stable, no new changes  All other systems were reviewed with the patient and are negative.  PHYSICAL EXAMINATION: ECOG PERFORMANCE STATUS: 1 - Symptomatic but completely ambulatory  Filed Vitals:   08/10/15 1231  BP: 94/59  Pulse: 65  Resp: 18   Filed Weights   08/10/15 1231  Weight: 238 lb 9.6 oz (108.228 kg)    GENERAL:alert, no distress and comfortable SKIN: skin color, texture, turgor are normal, no rashes or significant lesions EYES: normal, conjunctiva are pink and non-injected, sclera clear OROPHARYNX:no exudate, no erythema and lips, buccal mucosa, and tongue normal  NECK: supple, thyroid normal size,  non-tender, without nodularity LYMPH:  no palpable lymphadenopathy in the cervical, axillary or inguinal LUNGS: clear to auscultation and percussion with normal breathing effort HEART: (+) LVAD blood flow   ABDOMEN:abdomen soft, non-tender and normal bowel sounds Musculoskeletal:no cyanosis of digits and no clubbing  PSYCH: alert & oriented x 3 with fluent speech NEURO: no focal motor/sensory deficits  LABORATORY DATA:  I have reviewed the data as listed CBC Latest Ref Rng 08/04/2015 07/18/2015 07/04/2015  WBC 4.0 - 10.5 K/uL 7.6 6.9 6.8  Hemoglobin 13.0 - 17.0 g/dL 13.1 13.6 13.3  Hematocrit 39.0 - 52.0 % 40.3 42.2 40.9  Platelets 150 - 400 K/uL 129(L) 124(L) 122(L)    CMP Latest Ref Rng 07/04/2015 05/09/2015 03/20/2015  Glucose 65 - 99 mg/dL 130(H) 93 108(H)  BUN 6 - 20 mg/dL 15 14 13   Creatinine 0.61 - 1.24 mg/dL 1.10 1.04 0.81  Sodium 135 - 145 mmol/L 138 140 140  Potassium 3.5 - 5.1 mmol/L 4.6 4.3 4.0  Chloride 101 - 111 mmol/L 106 106 107  CO2 22 - 32 mmol/L 23 25 25   Calcium 8.9 - 10.3 mg/dL 10.0 9.9 9.9  Total Protein 6.5 - 8.1 g/dL 6.4(L) - -  Total Bilirubin 0.3 - 1.2 mg/dL 0.4 - -  Alkaline Phos 38 - 126 U/L 74 - -  AST 15 - 41 U/L 54(H) - -  ALT 17 - 63 U/L 29 - -   Ferritin  Status: Finalresult Visible to patient:  MyChart Nextappt: Today at 12:45 PM in Oncology [facility] Medico, Krista Blue, MD) Dx:  Iron deficiency anemia; Decreased iro...       Notes Recorded by Conrad [place], NP on 08/06/2015 at 2:51 PM [place]. See anticoagulation flow sheet.       Ref Range 6d ago  3wk ago  49mo ago     Ferritin 24 - 336 ng/mL 116 280R 70.4R           RADIOGRAPHIC STUDIES: I have personally reviewed the radiological images as listed and agreed with the findings in the report. No results found.  ASSESSMENT & PLAN:  74 year old Caucasian male, with extensive cardiac history, including CAD, age of 38 grays, ischemic cardiomyopathy status post LVAD, on long-term Coumadin,  presented with multiple episodes of GI bleeding and anemia.  1. Anemia secondary to GI bleeding and iron deficiency -HIS GI bleeding is probably related to Coumadin and LVAD. 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 -His iron studies showed low ferritin level and serum iron, consistent with iron deficient anemia. This is secondary to his GI bleeding. -His hemoglobin significant improved to 13 after IV Feraheme, but slightly dropped again today -He had low haptoglobin in the past, probably has low-grade of hemolysis secondary to LVAD.  -he is clinically doing well, his hemoglobin has been normal since he started ferritin infusion as needed. -Repeat a CBC and iron study every one month, and IV Feraheme if ferritin less than 100  2. Mild thrombocytopenia -Secondary to LVAD -Plt count 129K last week, stable.  3. He will continue to follow-up with his primary care physician and cardiologist for his HTN, heart disease, hypothyroidism, OSA and mild liver cirrhosis..  Plan -Repeat CBC every month at a [place] cardiology clinic -IV Feraheme  510mg  X1 if ferritin less than 100, his wife will call us when she receives lab result from cardiology clinic  -Return to clinic in 4 months  All questions were answered. The patient knows to call the clinic with any problems, questions or concerns.  I spent 10 minutes counseling the patient face to face. The total time spent in the appointment was 20 minutes and more than 50% was on counseling.     Truitt Merle, MD 08/10/2015   1:19 PM

## 2015-08-21 ENCOUNTER — Ambulatory Visit (HOSPITAL_COMMUNITY)
Admission: RE | Admit: 2015-08-21 | Discharge: 2015-08-21 | Disposition: A | Discharge status: Home / Self Care | Payer: Non-veteran care | Source: Ambulatory Visit | Attending: Cardiology | Admitting: Cardiology

## 2015-08-21 ENCOUNTER — Other Ambulatory Visit (HOSPITAL_COMMUNITY): Payer: Self-pay | Admitting: Infectious Diseases

## 2015-08-21 ENCOUNTER — Other Ambulatory Visit: Payer: Self-pay | Admitting: Infectious Diseases

## 2015-08-21 ENCOUNTER — Other Ambulatory Visit: Payer: Medicare Other

## 2015-08-21 DIAGNOSIS — Z5181 Encounter for therapeutic drug level monitoring: Secondary | ICD-10-CM

## 2015-08-21 DIAGNOSIS — Z7901 Long term (current) use of anticoagulants: Secondary | ICD-10-CM

## 2015-08-21 LAB — PROTIME-INR
INR: 2.06 — ABNORMAL HIGH (ref 0.00–1.49)
Prothrombin Time: 23.1 seconds — ABNORMAL HIGH (ref 11.6–15.2)

## 2015-08-22 ENCOUNTER — Emergency Department (HOSPITAL_COMMUNITY): Payer: Medicare Other

## 2015-08-22 ENCOUNTER — Ambulatory Visit (HOSPITAL_COMMUNITY): Payer: Self-pay | Admitting: Infectious Diseases

## 2015-08-22 ENCOUNTER — Encounter (HOSPITAL_COMMUNITY): Payer: Self-pay | Admitting: Emergency Medicine

## 2015-08-22 ENCOUNTER — Emergency Department (HOSPITAL_COMMUNITY)
Admission: EM | Admit: 2015-08-22 | Discharge: 2015-08-23 | Disposition: A | Discharge status: Home / Self Care | Payer: Medicare Other | Attending: Emergency Medicine | Admitting: Emergency Medicine

## 2015-08-22 DIAGNOSIS — E669 Obesity, unspecified: Secondary | ICD-10-CM | POA: Diagnosis not present

## 2015-08-22 DIAGNOSIS — Z951 Presence of aortocoronary bypass graft: Secondary | ICD-10-CM | POA: Insufficient documentation

## 2015-08-22 DIAGNOSIS — F329 Major depressive disorder, single episode, unspecified: Secondary | ICD-10-CM | POA: Insufficient documentation

## 2015-08-22 DIAGNOSIS — E785 Hyperlipidemia, unspecified: Secondary | ICD-10-CM | POA: Insufficient documentation

## 2015-08-22 DIAGNOSIS — I480 Paroxysmal atrial fibrillation: Secondary | ICD-10-CM

## 2015-08-22 DIAGNOSIS — Z87891 Personal history of nicotine dependence: Secondary | ICD-10-CM | POA: Diagnosis not present

## 2015-08-22 DIAGNOSIS — Z95811 Presence of heart assist device: Secondary | ICD-10-CM

## 2015-08-22 DIAGNOSIS — I10 Essential (primary) hypertension: Secondary | ICD-10-CM | POA: Diagnosis not present

## 2015-08-22 DIAGNOSIS — I5023 Acute on chronic systolic (congestive) heart failure: Secondary | ICD-10-CM

## 2015-08-22 DIAGNOSIS — I482 Chronic atrial fibrillation: Secondary | ICD-10-CM | POA: Insufficient documentation

## 2015-08-22 DIAGNOSIS — G4733 Obstructive sleep apnea (adult) (pediatric): Secondary | ICD-10-CM | POA: Insufficient documentation

## 2015-08-22 DIAGNOSIS — Z7901 Long term (current) use of anticoagulants: Secondary | ICD-10-CM | POA: Diagnosis not present

## 2015-08-22 DIAGNOSIS — R040 Epistaxis: Secondary | ICD-10-CM | POA: Diagnosis present

## 2015-08-22 DIAGNOSIS — I5032 Chronic diastolic (congestive) heart failure: Secondary | ICD-10-CM | POA: Insufficient documentation

## 2015-08-22 DIAGNOSIS — I509 Heart failure, unspecified: Secondary | ICD-10-CM

## 2015-08-22 DIAGNOSIS — R0600 Dyspnea, unspecified: Secondary | ICD-10-CM

## 2015-08-22 DIAGNOSIS — Z95810 Presence of automatic (implantable) cardiac defibrillator: Secondary | ICD-10-CM | POA: Insufficient documentation

## 2015-08-22 DIAGNOSIS — E039 Hypothyroidism, unspecified: Secondary | ICD-10-CM | POA: Insufficient documentation

## 2015-08-22 DIAGNOSIS — Z9981 Dependence on supplemental oxygen: Secondary | ICD-10-CM | POA: Insufficient documentation

## 2015-08-22 DIAGNOSIS — I252 Old myocardial infarction: Secondary | ICD-10-CM | POA: Diagnosis not present

## 2015-08-22 DIAGNOSIS — I2510 Atherosclerotic heart disease of native coronary artery without angina pectoris: Secondary | ICD-10-CM | POA: Insufficient documentation

## 2015-08-22 DIAGNOSIS — J449 Chronic obstructive pulmonary disease, unspecified: Secondary | ICD-10-CM | POA: Insufficient documentation

## 2015-08-22 DIAGNOSIS — Z79899 Other long term (current) drug therapy: Secondary | ICD-10-CM | POA: Insufficient documentation

## 2015-08-22 LAB — BASIC METABOLIC PANEL
ANION GAP: 7 (ref 5–15)
BUN: 16 mg/dL (ref 6–20)
CALCIUM: 9.9 mg/dL (ref 8.9–10.3)
CHLORIDE: 106 mmol/L (ref 101–111)
CO2: 25 mmol/L (ref 22–32)
Creatinine, Ser: 1.07 mg/dL (ref 0.61–1.24)
GFR calc Af Amer: >60 mL/min (ref >60)
GFR calc non Af Amer: >60 mL/min (ref >60)
GLUCOSE: 129 mg/dL — AB (ref 65–99)
POTASSIUM: 4.2 mmol/L (ref 3.5–5.1)
Sodium: 138 mmol/L (ref 135–145)

## 2015-08-22 LAB — CBC
HCT: 42.7 % (ref 39.0–52.0)
Hemoglobin: 13.5 g/dL (ref 13.0–17.0)
MCH: 32.2 pg (ref 26.0–34.0)
MCHC: 31.6 g/dL (ref 30.0–36.0)
MCV: 101.9 fL — AB (ref 78.0–100.0)
PLATELETS: 128 10*3/uL — AB (ref 150–400)
RBC: 4.19 MIL/uL — ABNORMAL LOW (ref 4.22–5.81)
RDW: 15.8 % — AB (ref 11.5–15.5)
WBC: 7.9 10*3/uL (ref 4.0–10.5)

## 2015-08-22 LAB — BRAIN NATRIURETIC PEPTIDE: B Natriuretic Peptide: 203.2 pg/mL — ABNORMAL HIGH (ref 0.0–100.0)

## 2015-08-22 LAB — PROTIME-INR
INR: 1.91 — ABNORMAL HIGH (ref 0.00–1.49)
Prothrombin Time: 21.8 seconds — ABNORMAL HIGH (ref 11.6–15.2)

## 2015-08-22 LAB — LACTATE DEHYDROGENASE: LDH: 284 U/L — AB (ref 98–192)

## 2015-08-22 MED ORDER — FUROSEMIDE 10 MG/ML IJ SOLN
40.0000 mg | Freq: Once | INTRAMUSCULAR | Status: AC
  Administered 2015-08-22: 40 mg via INTRAVENOUS
  Filled 2015-08-22: qty 4

## 2015-08-22 NOTE — Discharge Instructions (Signed)

## 2015-08-22 NOTE — ED Provider Notes (Signed)
CSN: [id number]     Arrival date & time 08/22/15  1204 History   First MD Initiated Contact with Patient 08/22/15 1214     Chief Complaint  Patient presents with  . Epistaxis     (Consider location/radiation/quality/duration/timing/severity/associated sxs/prior Treatment) Patient is a 74 y.o. male presenting with nosebleeds. The history is provided by the patient.  Epistaxis Location:  R nare Severity:  Mild Duration:  1 day Timing:  Intermittent Progression:  Waxing and waning Chronicity:  Recurrent Context: anticoagulants   Relieved by:  Nothing Worsened by:  Nothing tried Ineffective treatments:  Applying pressure and nasal tampon Associated symptoms: no blood in oropharynx, no facial pain and no fever   Risk factors: frequent nosebleeds     Past Medical History  Diagnosis Date  . Ischemic cardiomyopathy      CABG 2003, PCI 2007  EF 27%(myoview 2012  . Chronic systolic heart failure ([place])   . Hyperlipidemia   . Hypothyroidism   . Chronic anticoagulation     Afib and LVAD  . Obesity   . COPD (chronic obstructive pulmonary disease) ([place])   . Asbestosis(501)     "6 years in the [place]" (05/24/2013)  . Atrial fibrillation ([place])     permanent  . Paroxysmal ventricular tachycardia ([place])   . Coronary artery disease   . Implantable cardioverter-defibrillator Medtronic   . Hypertension   . Myocardial infarction ([place]) 1990's-2000    "2 in  ~ the 1990's; 1 in ~ 2000" (05/24/2013)  . OSA on CPAP   . Depression   . LVAD (left ventricular assist device) present ([place]) 12/2012   Past Surgical History  Procedure Laterality Date  . Coronary artery bypass graft  2003    "?X4" (05/24/2013)  . Cardiac defibrillator placement  2004; ~ 2010  . Insertion of implantable left ventricular assist device N/A 01/12/2013    Procedure: INSERTION OF IMPLANTABLE LEFT VENTRICULAR ASSIST DEVICE;  Surgeon: Ivin Poot, MD;  Location: [place];  Service: Open Heart Surgery;  Laterality: N/A;    nitric oxide; Redo sternotomy  . Intraoperative transesophageal echocardiogram N/A 01/12/2013    Procedure: INTRAOPERATIVE TRANSESOPHAGEAL ECHOCARDIOGRAM;  Surgeon: Ivin Poot, MD;  Location: [place];  Service: Open Heart Surgery;  Laterality: N/A;  . Colonoscopy N/A 03/09/2014    Procedure: COLONOSCOPY;  Surgeon: Inda Castle, MD;  Location: [place];  Service: Endoscopy;  Laterality: N/A;  LVAD  patient  . Esophagogastroduodenoscopy N/A 03/09/2014    Procedure: ESOPHAGOGASTRODUODENOSCOPY (EGD);  Surgeon: Inda Castle, MD;  Location: [place];  Service: Endoscopy;  Laterality: N/A;  . Givens capsule study N/A 03/10/2014    Procedure: GIVENS CAPSULE STUDY;  Surgeon: Inda Castle, MD;  Location: [place];  Service: Endoscopy;  Laterality: N/A;  . Enteroscopy N/A 07/27/2014    Procedure: ENTEROSCOPY;  Surgeon: Gatha Mayer, MD;  Location: [place];  Service: Endoscopy;  Laterality: N/A;  LVAD patient   . Colonoscopy N/A 07/29/2014    Procedure: COLONOSCOPY;  Surgeon: Gatha Mayer, MD;  Location: [place];  Service: Endoscopy;  Laterality: N/A;  . Givens capsule study N/A 07/30/2014    Procedure: GIVENS CAPSULE STUDY;  Surgeon: Gatha Mayer, MD;  Location: [place];  Service: Endoscopy;  Laterality: N/A;  . Esophagogastroduodenoscopy N/A 08/01/2014    Procedure: ESOPHAGOGASTRODUODENOSCOPY (EGD);  Surgeon: Jerene Bears, MD;  Location: [facility] ENDOSCOPY;  Service: Endoscopy;  Laterality: N/A;  LVAD patient  . Left and right heart catheterization with coronary/graft angiogram  01/07/2013    Procedure: LEFT AND RIGHT HEART CATHETERIZATION WITH Beatrix Fetters;  Surgeon: Jolaine Artist, MD;  Location: [facility] CATH LAB;  Service: Cardiovascular;;  . Enteroscopy N/A 09/27/2014    Procedure: ENTEROSCOPY;  Surgeon: Gatha Mayer, MD;  Location: [facility] ENDOSCOPY;  Service: Endoscopy;  Laterality: N/A;  . Icd lead removal Left 03/16/2015    Procedure: ICD LEAD  REMOVAL/EXTRACTION;  Surgeon: Evans Lance, MD;  Location: [place];  Service: Cardiovascular;  Laterality: Left;  PATIENT HAS LVAD  DR. VAN TRIGT TO BACK UP EXTRACTION   Family History  Problem Relation Age of Onset  . Heart attack Mother   . Heart attack Father    Social History  Substance Use Topics  . Smoking status: Former Smoker -- 2.00 packs/day for 45 years    Types: Cigarettes    Quit date: 11/11/2001  . Smokeless tobacco: Never Used  . Alcohol Use: No     Comment: 05/24/2013 "use to drink beer; hardly nothing since 2003; once in awhile a beer "    Review of Systems  Constitutional: Negative for fever.  HENT: Positive for nosebleeds.   All other systems reviewed and are negative.     Allergies  Review of patient's allergies indicates no known allergies.  Home Medications   Prior to Admission medications   Medication Sig Start Date End Date Taking? Authorizing Provider  Albuterol (PROVENTIL IN) Inhale 2.5 mg into the lungs every 6 (six) hours as needed.    Historical Provider, MD  amiodarone (PACERONE) 200 MG tablet Take 1 tablet (200 mg total) by mouth daily. 01/10/15   Jolaine Artist, MD  budesonide-formoterol (SYMBICORT) 160-4.5 MCG/ACT inhaler Inhale 2 puffs into the lungs 2 (two) times daily. 01/10/15   Jolaine Artist, MD  cholecalciferol (VITAMIN D) 1000 UNITS tablet Take 1 tablet (1,000 Units total) by mouth 2 (two) times daily. 01/10/15   Jolaine Artist, MD  citalopram (CELEXA) 40 MG tablet Take 1 tablet (40 mg total) by mouth at bedtime. 01/10/15   Jolaine Artist, MD  docusate sodium (COLACE) 100 MG capsule Take 200 mg by mouth 2 (two) times daily.    Historical Provider, MD  furosemide (LASIX) 40 MG tablet Take 40 mg by mouth as needed for fluid.    Historical Provider, MD  hydrocortisone (ANUSOL-HC) 2.5 % rectal cream Place 1 application rectally 2 (two) times daily as needed for hemorrhoids or itching.    Historical Provider, MD  levothyroxine  (SYNTHROID, LEVOTHROID) 125 MCG tablet Take 125 mcg by mouth daily before breakfast.    Historical Provider, MD  losartan (COZAAR) 50 MG tablet Take 1.5 tablets (75 mg total) by mouth at bedtime. 01/10/15   Jolaine Artist, MD  Multiple Vitamins-Minerals (MULTIVITAMIN PO) Take 1 tablet by mouth at bedtime.     Historical Provider, MD  nitroGLYCERIN (NITROSTAT) 0.4 MG SL tablet Place 0.4 mg under the tongue every 5 (five) minutes as needed for chest pain.    Historical Provider, MD  pantoprazole (PROTONIX) 40 MG tablet Take 1 tablet (40 mg total) by mouth 2 (two) times daily. 01/10/15   Jolaine Artist, MD  potassium chloride (K-DUR) 10 MEQ tablet Take 10 mEq by mouth as needed (when taking furosemide).    Historical Provider, MD  simvastatin (ZOCOR) 80 MG tablet Take 0.5 tablets (40 mg total) by mouth at bedtime. 01/10/15   Jolaine Artist, MD  tiotropium (SPIRIVA) 18 MCG inhalation capsule Place 1 capsule (18 mcg  total) into inhaler and inhale daily. 01/10/15   Jolaine Artist, MD  vitamin B-12 (CYANOCOBALAMIN) 1000 MCG tablet Take 1,000 mcg by mouth at bedtime.    Historical Provider, MD  vitamin C (ASCORBIC ACID) 500 MG tablet Take 1 tablet (500 mg total) by mouth 2 (two) times daily. 01/10/15   Jolaine Artist, MD  warfarin (COUMADIN) 5 MG tablet Take 2.5 mg on Mon and Fri. All other days take 5 mg . On  6/21 take 2.5 mg. . 03/21/15   Amy D Clegg, NP   Pulse 84  Temp(Src) 97.3 F (36.3 C) (Oral)  Resp 24  SpO2 95% Physical Exam  Constitutional: He is oriented to person, place, and time. He appears well-developed and well-nourished. No distress.  HENT:  Head: Normocephalic and atraumatic.  Nose: No septal deviation or nasal septal hematoma. Epistaxis (right nare, smal volume) is observed.  Eyes: Conjunctivae are normal.  Neck: Neck supple. No tracheal deviation present.  Cardiovascular: Normal rate and regular rhythm.   Pulmonary/Chest: Effort normal. No respiratory distress.   Abdominal: Soft. He exhibits no distension.  Neurological: He is alert and oriented to person, place, and time.  Skin: Skin is warm and dry.  Psychiatric: He has a normal mood and affect.    ED Course  .Epistaxis Management Date/Time: 08/23/2015 5:46 PM Performed by: Leo Grosser Authorized by: Leo Grosser Consent: Verbal consent obtained. Risks and benefits: risks, benefits and alternatives were discussed Consent given by: patient Patient understanding: patient states understanding of the procedure being performed Required items: required blood products, implants, devices, and special equipment available Patient identity confirmed: verbally with patient, hospital-assigned identification number, arm band and provided demographic data Patient sedated: no Treatment site: right anterior Repair method: merocel sponge Post-procedure assessment: bleeding stopped Treatment complexity: simple Recurrence: recurrence of recent bleed Patient tolerance: Patient tolerated the procedure well with no immediate complications   (including critical care time) Labs Review Labs Reviewed  CBC - Abnormal; Notable for the following:    RBC 4.19 (*)    MCV 101.9 (*)    RDW 15.8 (*)    Platelets 128 (*)    All other components within normal limits  PROTIME-INR - Abnormal; Notable for the following:    Prothrombin Time 21.8 (*)    INR 1.91 (*)    All other components within normal limits  LACTATE DEHYDROGENASE - Abnormal; Notable for the following:    LDH 284 (*)    All other components within normal limits  BRAIN NATRIURETIC PEPTIDE - Abnormal; Notable for the following:    B Natriuretic Peptide 203.2 (*)    All other components within normal limits  BASIC METABOLIC PANEL - Abnormal; Notable for the following:    Glucose, Bld 129 (*)    All other components within normal limits    Imaging Review Dg Chest Port 1 View  08/22/2015  CLINICAL DATA:  Shortness of breath, heart failure.  EXAM: PORTABLE CHEST 1 VIEW COMPARISON:  03/16/2015 FINDINGS: Left ventricular assist device again noted. There is cardiomegaly. Prior CABG. No overt edema. No confluent opacities or effusions. No acute bony abnormality. IMPRESSION: Cardiomegaly.  No acute findings. Electronically Signed   By: Rolm Baptise M.D.   On: 08/22/2015 14:00   I have personally reviewed and evaluated these images and lab results as part of my medical decision-making.   EKG Interpretation None      MDM   Final diagnoses:  Epistaxis, recurrent    74 y.o. male with  h/o LVAD and anticoagulation presents with recurrent right sided epistaxis that he has had previously. Currently low volume bleed. Packed with merocell. HF team came to see Pt who is now complaining of ShoB over lat 3 days. In A-fib, monitored and considered cardioversion but spontaneously resolved. HF team to follow cardiac status, Pt to remove merocell packing in 3 days. Return at any time with recurrence or worsening of symptoms.    Leo Grosser, MD 08/23/15 [phone]

## 2015-08-22 NOTE — ED Notes (Signed)
Pt here from home with c/o nosebleed right side nostril started around 5 am pt is on coumadin pt is a lvad pt but only here for nosebleed

## 2015-08-22 NOTE — Consult Note (Signed)
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 Quit smoking 2003. Uses CPAP and O2 every night. He is s/p LVAD HM II implanted 01/12/13 under DT criteria  Admitted to [facility] 09/13/13 - 09/14/13 for increased fatigue and dyspnea. Found to have mild CHF and anemia. Diuresed with IV lasix and transitioned to PO lasix 40 mg twice a week. Discharge weight 203 lbs on inpatient scale. Admitting labs revealed Hgb 7.7 for which he received 2 units PCs with appropriate rise in Hgb.   Admit 6/8-6/12 for symptomatic anemia. Had EGD/Colonoscopy which was unrevealing. 6 mm polyp clipped and removed. CT abdomen showed cirrhosis. Placed on heparin bridge. Capsule study pending. Sent home on lovenox. D/C weight 206 lbs.   Admitted to [facility] with with GI bleed. GI consulted. Push enteroscopy 10/28 negative. Colonoscopy 10/29 without source of bleeding.Result of capsule endoscopy--> Showed bleeding from duodenum. EGD--->Bleeding from duodenum with clip applied. Overall he received 10 UPRBCs. Discharge weight was 207 pounds.   Admitted to [facility] 09/21/14 with GI bleed . GI consulted. Enteroscopy showed bleed from jejunum requiring 3 clips. Overall he received 4 units PRBCs. He went back in A fib and had DC-CV. On the day discharge he was back in NSR on amiodarone 200 mg twice a day. Discharge weight was 214 pounds.   Admitted in 6/16 for ICD extraction due to pocket infection.   He presents to [facility] ED today with nose bleed. Unable to bleed at home. Tried to use tampon at home but continued to bleed. INR 2.0. He reports increased dyspnea over the last 3 days. Dyspnea with steps. Needs to take a break walking up the steps. Weight at home 226-227 pounds. Has not been taking lasix over the last month. Big appetite. No BRBPR.   LVAD INTERROGATION:    HeartMate II LVAD:  Flow4.7 liters/min, speed 9190, power 5.4, PI 4.8 0-3 PI events a day    Review of Systems: [y] = yes, [ ]  = no   General: Weight gain [ ] ; Weight loss [ ] ; Anorexia [ ] ; Fatigue [Y ]; Fever [ ] ; Chills [ ] ; Weakness [Y ]  Cardiac: Chest pain/pressure [ ] ; Resting SOB [ ] ; Exertional SOB [ Y]; Orthopnea [ Y]; Pedal Edema [ ] ; Palpitations [ ] ; Syncope [ ] ; Presyncope [ ] ; Paroxysmal nocturnal dyspnea[ ]   Pulmonary: Cough [ ] ; Wheezing[ ] ; Hemoptysis[ ] ; Sputum [ ] ; Snoring [ ]   GI: Vomiting[ ] ; Dysphagia[ ] ; Melena[ ] ; Hematochezia [ ] ; Heartburn[ ] ; Abdominal pain [ ] ; Constipation [ ] ; Diarrhea [ ] ; BRBPR [ ]   GU: Hematuria[ ] ; Dysuria [ ] ; Nocturia[ ]   Vascular: Pain in legs with walking [ ] ; Pain in feet with lying flat [ ] ; Non-healing sores [ ] ; Stroke [ ] ; TIA [ ] ; Slurred speech [ ] ;  Neuro: Headaches[ ] ; Vertigo[ ] ; Seizures[ ] ; Paresthesias[ ] ;Blurred vision [ ] ; Diplopia [ ] ; Vision changes [ ]   Ortho/Skin: Arthritis [ ] ; Joint pain [Y ]; Muscle pain [ ] ; Joint swelling [ ] ; Back Pain [ ] ; Rash [ ]   Psych: Depression[ ] ; Anxiety[ ]   Heme: Bleeding problems [ ] ; Clotting disorders [ ] ; Anemia [ ]   Endocrine: Diabetes [ ] ; Thyroid dysfunction[ ]   Home Medications Prior to Admission medications   Medication  Sig Start Date End Date Taking? Authorizing Provider  Albuterol (PROVENTIL IN) Inhale 2.5 mg into the lungs every 6 (six) hours as needed.    Historical Provider, MD  amiodarone (PACERONE) 200 MG tablet Take 1 tablet (200 mg total) by mouth daily. 01/10/15   Jolaine Artist, MD  budesonide-formoterol (SYMBICORT) 160-4.5 MCG/ACT inhaler Inhale 2 puffs into the lungs 2 (two) times daily. 01/10/15   Jolaine Artist, MD  cholecalciferol (VITAMIN D) 1000 UNITS tablet Take 1 tablet (1,000 Units total) by mouth 2 (two) times daily. 01/10/15   Jolaine Artist, MD  citalopram (CELEXA) 40 MG tablet Take 1 tablet (40 mg total) by mouth at bedtime. 01/10/15   Jolaine Artist, MD  docusate sodium (COLACE) 100 MG capsule Take 200 mg by mouth 2 (two) times daily.    Historical Provider, MD  furosemide (LASIX) 40 MG tablet Take 40 mg by mouth as needed for fluid.    Historical Provider, MD  hydrocortisone (ANUSOL-HC) 2.5 % rectal cream Place 1 application rectally 2 (two) times daily as needed for hemorrhoids or itching.    Historical Provider, MD  levothyroxine (SYNTHROID, LEVOTHROID) 125 MCG tablet Take 125 mcg by mouth daily before breakfast.    Historical Provider, MD  losartan (COZAAR) 50 MG tablet Take 1.5 tablets (75 mg total) by mouth at bedtime. 01/10/15   Jolaine Artist, MD  Multiple Vitamins-Minerals (MULTIVITAMIN PO) Take 1 tablet by mouth at bedtime.     Historical Provider, MD  nitroGLYCERIN (NITROSTAT) 0.4 MG SL tablet Place 0.4 mg under the tongue every 5 (five) minutes as needed for chest pain.    Historical Provider, MD  pantoprazole (PROTONIX) 40 MG tablet Take 1 tablet (40 mg total) by mouth 2 (two) times daily. 01/10/15   Jolaine Artist, MD  potassium chloride (K-DUR) 10 MEQ tablet Take 10 mEq by mouth as needed (when taking furosemide).    Historical Provider, MD  simvastatin (ZOCOR) 80 MG tablet Take 0.5 tablets (40 mg total) by mouth at bedtime. 01/10/15   Jolaine Artist, MD  tiotropium (SPIRIVA) 18 MCG inhalation capsule Place 1 capsule (18 mcg total) into inhaler and inhale daily. 01/10/15   Jolaine Artist, MD  vitamin B-12 (CYANOCOBALAMIN) 1000 MCG tablet Take 1,000 mcg by mouth at bedtime.    Historical Provider, MD  vitamin C (ASCORBIC ACID) 500 MG tablet Take 1 tablet (500 mg total) by mouth 2 (two) times daily. 01/10/15   Jolaine Artist, MD  warfarin (COUMADIN) 5 MG tablet Take 2.5 mg on Mon and Fri. All other days take 5 mg . On  6/21 take 2.5 mg. . 03/21/15   Amy Estrella Deeds, NP    Past Medical History: Past Medical History  Diagnosis Date  . Ischemic cardiomyopathy      CABG 2003, PCI 2007  EF 27%(myoview 2012  .  Chronic systolic heart failure ([place])   . Hyperlipidemia   . Hypothyroidism   . Chronic anticoagulation     Afib and LVAD  . Obesity   . COPD (chronic obstructive pulmonary disease) ([place])   . Asbestosis([place])     "[place] years in [place]" (05/24/2013)  . Atrial fibrillation ([place])     permanent  . Paroxysmal ventricular tachycardia ([place])   . Coronary artery disease   . Implantable cardioverter-defibrillator Medtronic   . Hypertension   . Myocardial infarction ([place]) 1990's-2000    "2 in  ~ the 1990's; 1 in ~ 2000" (05/24/2013)  .  OSA on CPAP   . Depression   . LVAD (left ventricular assist device) present ([place]) 12/2012    Past Surgical History: Past Surgical History  Procedure Laterality Date  . Coronary artery bypass graft  2003    "?X4" (05/24/2013)  . Cardiac defibrillator placement  2004; ~ 2010  . Insertion of implantable left ventricular assist device N/A 01/12/2013    Procedure: INSERTION OF IMPLANTABLE LEFT VENTRICULAR ASSIST DEVICE;  Surgeon: Ivin Poot, MD;  Location: [place];  Service: Open Heart Surgery;  Laterality: N/A;   nitric oxide; Redo sternotomy  . Intraoperative transesophageal echocardiogram N/A 01/12/2013    Procedure: INTRAOPERATIVE TRANSESOPHAGEAL ECHOCARDIOGRAM;  Surgeon: Ivin Poot, MD;  Location: [place];  Service: Open Heart Surgery;  Laterality: N/A;  . Colonoscopy N/A 03/09/2014    Procedure: COLONOSCOPY;  Surgeon: Inda Castle, MD;  Location: [place];  Service: Endoscopy;  Laterality: N/A;  LVAD  patient  . Esophagogastroduodenoscopy N/A 03/09/2014    Procedure: ESOPHAGOGASTRODUODENOSCOPY (EGD);  Surgeon: Inda Castle, MD;  Location: [place];  Service: Endoscopy;  Laterality: N/A;  . Givens capsule study N/A 03/10/2014    Procedure: GIVENS CAPSULE STUDY;  Surgeon: Inda Castle, MD;  Location: [place];  Service: Endoscopy;  Laterality: N/A;  . Enteroscopy N/A 07/27/2014    Procedure: ENTEROSCOPY;  Surgeon: Gatha Mayer, MD;   Location: [place];  Service: Endoscopy;  Laterality: N/A;  LVAD patient   . Colonoscopy N/A 07/29/2014    Procedure: COLONOSCOPY;  Surgeon: Gatha Mayer, MD;  Location: [place];  Service: Endoscopy;  Laterality: N/A;  . Givens capsule study N/A 07/30/2014    Procedure: GIVENS CAPSULE STUDY;  Surgeon: Gatha Mayer, MD;  Location: [place];  Service: Endoscopy;  Laterality: N/A;  . Esophagogastroduodenoscopy N/A 08/01/2014    Procedure: ESOPHAGOGASTRODUODENOSCOPY (EGD);  Surgeon: Jerene Bears, MD;  Location[facility] ENDOSCOPY;  Service: Endoscopy;  Laterality: N/A;  LVAD patient  . Left and right heart catheterization with coronary/graft angiogram  01/07/2013    Procedure: LEFT AND RIGHT HEART CATHETERIZATION WITH Beatrix Fetters;  Surgeon: Jolaine Artist, MD;  Location: [facility] CATH LAB;  Service: Cardiovascular;;  . Enteroscopy N/A 09/27/2014    Procedure: ENTEROSCOPY;  Surgeon: Gatha Mayer, MD;  Location: [facility] ENDOSCOPY;  Service: Endoscopy;  Laterality: N/A;  . Icd lead removal Left 03/16/2015    Procedure: ICD LEAD REMOVAL/EXTRACTION;  Surgeon: Evans Lance, MD;  Location: [place];  Service: Cardiovascular;  Laterality: Left;  PATIENT HAS LVAD  DR. VAN TRIGT TO BACK UP EXTRACTION    Family History: Family History  Problem Relation Age of Onset  . Heart attack Mother   . Heart attack Father     Social History: Social History   Social History  . Marital Status: Married    Spouse Name: N/A  . Number of Children: N/A  . Years of Education: N/A   Social History Main Topics  . Smoking status: Former Smoker -- 2.00 packs/day for 45 years    Types: Cigarettes    Quit date: 11/11/2001  . Smokeless tobacco: Never Used  . Alcohol Use: No     Comment: 05/24/2013 "use to drink beer; hardly nothing since 2003; once in awhile a beer "  . Drug Use: No  . Sexual Activity: No   Other Topics Concern  . None   Social History Narrative    Allergies:  No Known  Allergies  Objective:    Vital Signs:  Temp:  [97.3 F (36.3 C)] 97.3 F (36.3 C) (11/22 1228) Pulse Rate:  [84] 84 (11/22 1228) Resp:  [24] 24 (11/22 1228) SpO2:  [95 %] 95 % (11/22 1228)   There were no vitals filed for this visit.  Mean arterial Pressure 84  Physical Exam: General:  Fatigued appearing. No resp difficulty. Wife at bedside. HEENT: normal except merocel r nare  Neck: supple. JVP 10-11 . Carotids 2+ bilat; no bruits. No lymphadenopathy or thryomegaly appreciated. Cor: Mechanical heart sounds with LVAD hum present. Irregular  Lungs: clear Abdomen: soft, nontender, +distended. No hepatosplenomegaly. No bruits or masses. Good bowel sounds. Driveline: C/D/I; securement device intact and driveline incorporated Extremities: no cyanosis, clubbing, rash, LLE 1+ edema RLE trace Neuro: alert & orientedx3, cranial nerves grossly intact. moves all 4 extremities w/o difficulty. Affect pleasant  Telemetry: In and out of A fib/NSR 80-90s   Labs: Basic Metabolic Panel: No results for input(s): NA, K, CL, CO2, GLUCOSE, BUN, CREATININE, CALCIUM, MG, PHOS in the last 168 hours.  Liver Function Tests: No results for input(s): AST, ALT, ALKPHOS, BILITOT, PROT, ALBUMIN in the last 168 hours. No results for input(s): LIPASE, AMYLASE in the last 168 hours. No results for input(s): AMMONIA in the last 168 hours.  CBC: No results for input(s): WBC, NEUTROABS, HGB, HCT, MCV, PLT in the last 168 hours.  Cardiac Enzymes: No results for input(s): CKTOTAL, CKMB, CKMBINDEX, TROPONINI in the last 168 hours.  BNP: BNP (last 3 results)  Recent Labs  12/20/14 1400 05/09/15 0945 07/04/15 1147  BNP 197.6* 303.3* 267.1*    ProBNP (last 3 results) No results for input(s): PROBNP in the last 8760 hours.   CBG: No results for input(s): GLUCAP in the last 168 hours.  Coagulation Studies:  Recent Labs  08/21/15 0900  LABPROT 23.1*  INR 2.06*    Other results: EKG: A fib 94  bpm   Imaging:  No results found.    Assessment:   1. Epistaxis 2. A/C Systolic Heart Failure 3. HMII LVAD  - INR Goal 1.8-2.5  4. PAF 5. H/O Anemia / GI Bleed H/O Had bleeding noted in jejunum requiring 3 clips   Plan/Discussion:   Mr Moczygemba is 74 year old with HMII LVAD for DT followed closely in the LVAD clinic.   Today he presented with epitaxis and dyspnea. Bleeding stopped with afrin and merocel. Hemoglobin stable. Map stable. VAD parameters stable.    Mild volume overload noted today. Give one dose of IV lasix now then he will resume 40 mg po lasix as needed.   Today he is in and out of  NSR and A fib. Controlled rate. Continue amio 200 mg daily. INR 2.0 Continue current warfarin dose.   Home today with follow up in LVAD clinic in a few weeks. We will set up.   Labs today: Hgb 13.5 INR 1.91  LDH 284 K 4.2  Creatinine 1.07. CXR ok.   I reviewed the LVAD parameters from today, and compared the results to the patient's prior recorded data.  No programming changes were made.  The LVAD is functioning within specified parameters.  The patient performs LVAD self-test daily.  LVAD interrogation was negative for any significant power changes, alarms or PI events/speed drops.  LVAD equipment check completed and is in good working order.  Back-up equipment present.   LVAD education done on emergency procedures and precautions and reviewed exit site care.  Length of Stay:   [name] NP-C  08/22/2015,  1:00 PM  VAD Team Pager [phone] (7am - 7am) +++VAD ISSUES ONLY+++ Advanced Heart Failure Team Pager [phone] (M-F; [place])  Please contact [facility] for night-coverage after hours (4p -7a ) and weekends on [email or web address] for all non- LVAD Issues  Patient seen and examined with [name], NP. We discussed all aspects of the encounter. I agree with the assessment and plan as stated above.   Epistaxis resolved with Afrin and Merocel. He does have mild volume overload and we gave  1 dose IV lasix. He is in/out of AF in ER. Recent [place] in 2/16 showed AF 155 of time. Remains on amio 200 daily. Labs and VAD interrogation are stable. Can be discharged from ER with f/u in [facility].   Bensimhon, Daniel,MD 3:23 PM

## 2015-08-22 NOTE — ED Notes (Signed)
Lvad Np at bedside , pt stated that he had been sob for the last three days , chest x ray ordered and ekg done

## 2015-09-05 ENCOUNTER — Ambulatory Visit (HOSPITAL_COMMUNITY)
Admission: RE | Admit: 2015-09-05 | Discharge: 2015-09-05 | Disposition: A | Discharge status: Home / Self Care | Payer: Non-veteran care | Source: Ambulatory Visit | Attending: Cardiology | Admitting: Cardiology

## 2015-09-05 ENCOUNTER — Encounter (HOSPITAL_COMMUNITY): Payer: Self-pay

## 2015-09-05 ENCOUNTER — Other Ambulatory Visit: Payer: Self-pay | Admitting: Hematology

## 2015-09-05 ENCOUNTER — Encounter: Payer: Self-pay | Admitting: Licensed Clinical Social Worker

## 2015-09-05 ENCOUNTER — Ambulatory Visit (HOSPITAL_COMMUNITY): Payer: Self-pay | Admitting: Infectious Diseases

## 2015-09-05 ENCOUNTER — Telehealth: Payer: Self-pay | Admitting: *Deleted

## 2015-09-05 ENCOUNTER — Telehealth (HOSPITAL_COMMUNITY): Payer: Self-pay | Admitting: Infectious Diseases

## 2015-09-05 ENCOUNTER — Other Ambulatory Visit (HOSPITAL_COMMUNITY): Payer: Self-pay | Admitting: Infectious Diseases

## 2015-09-05 VITALS — BP 108/64 | HR 50 | Ht 73.0 in | Wt 228.0 lb

## 2015-09-05 DIAGNOSIS — G4733 Obstructive sleep apnea (adult) (pediatric): Secondary | ICD-10-CM | POA: Diagnosis not present

## 2015-09-05 DIAGNOSIS — Z7901 Long term (current) use of anticoagulants: Secondary | ICD-10-CM

## 2015-09-05 DIAGNOSIS — I10 Essential (primary) hypertension: Secondary | ICD-10-CM | POA: Diagnosis not present

## 2015-09-05 DIAGNOSIS — E785 Hyperlipidemia, unspecified: Secondary | ICD-10-CM | POA: Insufficient documentation

## 2015-09-05 DIAGNOSIS — F329 Major depressive disorder, single episode, unspecified: Secondary | ICD-10-CM | POA: Insufficient documentation

## 2015-09-05 DIAGNOSIS — Z95811 Presence of heart assist device: Secondary | ICD-10-CM | POA: Diagnosis not present

## 2015-09-05 DIAGNOSIS — I5022 Chronic systolic (congestive) heart failure: Secondary | ICD-10-CM | POA: Insufficient documentation

## 2015-09-05 DIAGNOSIS — J449 Chronic obstructive pulmonary disease, unspecified: Secondary | ICD-10-CM | POA: Insufficient documentation

## 2015-09-05 DIAGNOSIS — R001 Bradycardia, unspecified: Secondary | ICD-10-CM | POA: Diagnosis not present

## 2015-09-05 DIAGNOSIS — I480 Paroxysmal atrial fibrillation: Secondary | ICD-10-CM | POA: Diagnosis not present

## 2015-09-05 DIAGNOSIS — I2510 Atherosclerotic heart disease of native coronary artery without angina pectoris: Secondary | ICD-10-CM | POA: Insufficient documentation

## 2015-09-05 DIAGNOSIS — Z9981 Dependence on supplemental oxygen: Secondary | ICD-10-CM | POA: Insufficient documentation

## 2015-09-05 DIAGNOSIS — E039 Hypothyroidism, unspecified: Secondary | ICD-10-CM | POA: Insufficient documentation

## 2015-09-05 DIAGNOSIS — E669 Obesity, unspecified: Secondary | ICD-10-CM | POA: Diagnosis not present

## 2015-09-05 DIAGNOSIS — K7460 Unspecified cirrhosis of liver: Secondary | ICD-10-CM | POA: Insufficient documentation

## 2015-09-05 DIAGNOSIS — Z951 Presence of aortocoronary bypass graft: Secondary | ICD-10-CM | POA: Insufficient documentation

## 2015-09-05 DIAGNOSIS — Z79899 Other long term (current) drug therapy: Secondary | ICD-10-CM | POA: Insufficient documentation

## 2015-09-05 DIAGNOSIS — Z87891 Personal history of nicotine dependence: Secondary | ICD-10-CM | POA: Insufficient documentation

## 2015-09-05 DIAGNOSIS — I252 Old myocardial infarction: Secondary | ICD-10-CM | POA: Diagnosis not present

## 2015-09-05 DIAGNOSIS — I255 Ischemic cardiomyopathy: Secondary | ICD-10-CM | POA: Diagnosis not present

## 2015-09-05 DIAGNOSIS — Z5181 Encounter for therapeutic drug level monitoring: Secondary | ICD-10-CM

## 2015-09-05 DIAGNOSIS — D509 Iron deficiency anemia, unspecified: Secondary | ICD-10-CM | POA: Diagnosis not present

## 2015-09-05 DIAGNOSIS — Z95810 Presence of automatic (implantable) cardiac defibrillator: Secondary | ICD-10-CM | POA: Insufficient documentation

## 2015-09-05 LAB — CBC WITH DIFFERENTIAL/PLATELET
BASOS ABS: 0 10*3/uL (ref 0.0–0.1)
BASOS PCT: 0 %
EOS ABS: 0.3 10*3/uL (ref 0.0–0.7)
Eosinophils Relative: 4 %
HEMATOCRIT: 40.7 % (ref 39.0–52.0)
HEMOGLOBIN: 12.9 g/dL — AB (ref 13.0–17.0)
Lymphocytes Relative: 13 %
Lymphs Abs: 0.9 10*3/uL (ref 0.7–4.0)
MCH: 32.3 pg (ref 26.0–34.0)
MCHC: 31.7 g/dL (ref 30.0–36.0)
MCV: 101.8 fL — ABNORMAL HIGH (ref 78.0–100.0)
Monocytes Absolute: 0.4 10*3/uL (ref 0.1–1.0)
Monocytes Relative: 5 %
NEUTROS ABS: 5.6 10*3/uL (ref 1.7–7.7)
NEUTROS PCT: 78 %
Platelets: 113 10*3/uL — ABNORMAL LOW (ref 150–400)
RBC: 4 MIL/uL — AB (ref 4.22–5.81)
RDW: 15.2 % (ref 11.5–15.5)
WBC: 7.1 10*3/uL (ref 4.0–10.5)

## 2015-09-05 LAB — BASIC METABOLIC PANEL
ANION GAP: 8 (ref 5–15)
BUN: 15 mg/dL (ref 6–20)
CALCIUM: 9.4 mg/dL (ref 8.9–10.3)
CHLORIDE: 109 mmol/L (ref 101–111)
CO2: 25 mmol/L (ref 22–32)
Creatinine, Ser: 0.89 mg/dL (ref 0.61–1.24)
GFR calc non Af Amer: >60 mL/min (ref >60)
GLUCOSE: 136 mg/dL — AB (ref 65–99)
Potassium: 4 mmol/L (ref 3.5–5.1)
Sodium: 142 mmol/L (ref 135–145)

## 2015-09-05 LAB — FERRITIN: FERRITIN: 47 ng/mL (ref 24–336)

## 2015-09-05 LAB — IRON AND TIBC
Iron: 48 ug/dL (ref 45–182)
SATURATION RATIOS: 15 % — AB (ref 17.9–39.5)
TIBC: 319 ug/dL (ref 250–450)
UIBC: 271 ug/dL

## 2015-09-05 LAB — PROTIME-INR
INR: 2.24 — AB (ref 0.00–1.49)
Prothrombin Time: 24.6 seconds — ABNORMAL HIGH (ref 11.6–15.2)

## 2015-09-05 LAB — LACTATE DEHYDROGENASE: LDH: 248 U/L — AB (ref 98–192)

## 2015-09-05 NOTE — Telephone Encounter (Signed)
Called re: Iron and Ferritin labs were called to [name]. Ferritin was < 100. Results were routed to Dr. Burr Medico (Hematology) for management and possible Fe Infusions.   Verbalized understanding.

## 2015-09-05 NOTE — Telephone Encounter (Signed)
I sent a POF to schedule him for iv feraheme X1 within one week.  Thanks.  Truitt Merle

## 2015-09-05 NOTE — Telephone Encounter (Signed)
Received call from wife [name] wanting to let Dr. Burr Medico know that pt's Ferritin was decreased to 47 from labs done today.  [name] wanted to know if pt will be scheduled for Feraheme infusion.  Informed [name] that results and message will be sent to Dr. Burr Medico for review.  Wife understood that pt will be contacted with further instructions from md. Per [name], pt is feeling fine; able to perform ADLs without difficulty.  Pt is asymptomatic. Pt's   Phone     [phone].

## 2015-09-05 NOTE — Progress Notes (Signed)
Symptom  Yes  No  Details   Angina         x Activity:   Claudication                x How far:  Left leg "giving out"  Syncope         x When:   Stroke         x   Orthopnea         x How many pillows:   2 for comfort  PND         x How often:  CPAP         x  How many hrs:  All night  Pedal edema         x   Abd fullness         x   N&V         x   Diaphoresis         x        When:  Occasionally night sweats 2 - 3 x month  Bleeding               x   Urine color    medium yellow  SOB        x  Activity:  Incline; one block level ground   Palpitations         x When:  ICD shock         x   Hospitlizaitons               x When/where/why:    ED visit        x        When/where/why: ED at [facility] for epistaxis and AFib   Other MD              x When/who/why:    Activity    No formal activity; sedentary  Fluid    No limitations  Diet    No limitations   Vital signs: HR:  50 Doppler MAP: 110 Auto cuff:  108/64 (82) O2 Sat: 95% Home wts:  223 - 228 lbs Ht: 6'1"  LVAD interrogation reveals:  Speed: 9200 Flow:  4.5 Power: 5.1 PI:  6.7 Alarms: none Events: 0 - 5 PI events        Fixed speed:  9200 Low speed limit: 8600 Primary Controller:  Replace back up battery in 9 months (January 2017) Back up controller:   Replace back up battery in 9 months (January 2017)  LVAD exit site:  Well healed and incorporated. The velour is fully implanted at exit site. Dressing dry and intact. No erythema or drainage. Stabilization device present and accurately applied. Driveline dressing is being changed weekly per sterile technique using Sorbaview dressing with biopatch on exit site. Pt denies fever or chills. Dressing supplies provided to patient.   Encounter Details: Presents to clinic today with his wife for 2 month follow up. Was seen in the [facility] ED recently with epistaxis and was found to be in AFib. Converted to NSR in the ED on his own. Reports feeling well since being seen. No complaints  today. Taking lasix very infrequently--at most once a week.   INR --> 2.24 Having nose bleeds every Tuesday. Goal 1.8 - 2.5 w/o ASA (GIB). See anticoag note.   LDH --> 248. Stable with established baseline.   MAP--> 110 doppler and 108/64 (82).   Will forward the results of Iron  studies to Dr. Burr Medico as requested. No schedulef FU OV with her until March 2017.   RTC in 2 months for VAD follow up.   Janene Madeira, RN VAD Coordinator   Office: [phone] 24/7 VAD Pager: [phone]

## 2015-09-05 NOTE — Progress Notes (Signed)
CSW met with patient and wife in the clinic. Patient and wife spoke about life at home and challenges with her recent surgery and patient's needs. They plan to attend the next LVAD support group. Wife reports that attending the support group really helps her and gives her the support she needs. CSW will continue to follow for support as needed. Raquel Sarna, [place]

## 2015-09-05 NOTE — Patient Instructions (Addendum)
1. Take ONE lasix tab with TWO potassium TODAY AND TOMORROW.   Listen to your wife when she wants you to take Lasix :)   2. No changes with you Coumadin (INR 2.24)  3. Follow up with VAD clinic in 2 months.

## 2015-09-05 NOTE — Progress Notes (Signed)
Patient ID: BRIGGSTON STIGGERS, male   DOB: [birth year], 74 y.o.   MRN: [id number]    LVAD CLINIC NOTE  Patient ID: ALOYS GLANZMAN, male   DOB: 01/29/1941, 74 y.o.   MRN: [id number]  [facility] [place] (Dr. 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 Quit smoking 2003. Uses CPAP and O2 every night. He is s/p LVAD HM II implanted 01/12/13 under DT criteria  Admitted to [facility] 09/13/13 - 09/14/13 for increased fatigue and dyspnea. Found to have mild CHF and anemia. Diuresed with IV lasix and transitioned to PO lasix 40 mg twice a week. Discharge weight 203 lbs on inpatient scale.  Admitting labs revealed Hgb 7.7 for which he received 2 units PCs with appropriate rise in Hgb.   Admit 6/8-6/12 for symptomatic anemia. Had EGD/Colonoscopy which was unrevealing.  6 mm polyp clipped and removed. CT abdomen showed cirrhosis. Placed on heparin bridge. Capsule study pending. Sent home on lovenox. D/C weight 206 lbs.   Admitted to [facility] with with GI bleed. GI consulted. Push enteroscopy 10/28 negative. Colonoscopy 10/29 without source of bleeding.Result of capsule endoscopy--> Showed bleeding from duodenum. EGD--->Bleeding from duodenum with clip applied. Overall he received  10 UPRBCs. Discharge weight was 207 pounds.   Admitted to [facility] 09/21/14 with GI bleed . GI consulted. Enteroscopy showed bleed from jejunum requiring 3 clips. Overall he received 4 units PRBCs.  He went back in A fib and had DC-CV.  On the day discharge he was back in NSR on amiodarone 200 mg twice a day. Discharge weight was 214 pounds.   Admitted in 6/16 for ICD extraction due to pocket infection. Did well. No complications. No bleeding from site. Feels good. Still with some chronic DOE but no change. No edema. Does not take lasix.  Compliant with all meds.    He returns for follow up.Since the last visit he was evaluated in the ED for nose bleed with packing placed. Overall he is feeling good. Denies SOB, edema, orthopnea or PND. Does admit to dyspnea with inclines.  Weight at home up to 228 pounds. Has not had lasix. Taking all medications as prescribed.  Denies LVAD alarms. Denies driveline trauma, erythema or drainage.  Denies ICD shocks. Reports taking Coumadin as prescribed and adherence to anticoagulation based dietary restrictions. Denies bright red blood per rectum or melena, no dark urine or hematuria.   LVAD interrogation reveals:  Speed: 9200  Flow: 4.5 Power: 5.1 PI: 6.7 Alarms: none  Events: 0 - 5 PI events  Fixed speed: 9200  Low speed limit: 8600  Primary Controller: Replace back  (January 2017)  Back up controller: Replace back  (January 2017)  Labs (2/16): LDL 305, HCT 38.9 Labs (3/16): K 3.9, creatinine 1.05, LFTs normal   Past Medical History  Diagnosis Date  . Ischemic cardiomyopathy      CABG 2003, PCI 2007  EF 27%(myoview 2012  . Chronic systolic heart failure ([place])   . Hyperlipidemia   . Hypothyroidism   . Chronic anticoagulation     Afib and LVAD  . Obesity   . COPD (chronic obstructive pulmonary disease) ([place])   . Asbestosis(501)     "6 years in the [place]" (05/24/2013)  . Atrial fibrillation ([place])     permanent  . Paroxysmal ventricular tachycardia ([place])   .  Coronary artery disease   . Implantable cardioverter-defibrillator [employer]   . Hypertension   . Myocardial infarction ([place]) 1990's-2000    "2 in  ~ the 1990's; 1 in ~ 2000" (05/24/2013)  . OSA on CPAP   . Depression   . LVAD (left ventricular assist device) present ([place]) 12/2012    Current Outpatient Prescriptions  Medication Sig Dispense Refill  . Albuterol (PROVENTIL IN) Inhale 2.5 mg into the lungs every 6 (six) hours as needed.    [name] amiodarone (PACERONE) 200 MG tablet Take 1 tablet (200 mg total) by mouth daily.  90 tablet 3  . budesonide-formoterol (SYMBICORT) 160-4.5 MCG/ACT inhaler Inhale 2 puffs into the lungs 2 (two) times daily. 1 Inhaler 3  . cholecalciferol (VITAMIN D) 1000 UNITS tablet Take 1 tablet (1,000 Units total) by mouth 2 (two) times daily. 180 tablet 3  . citalopram (CELEXA) 40 MG tablet Take 1 tablet (40 mg total) by mouth at bedtime. 90 tablet 3  . docusate sodium (COLACE) 100 MG capsule Take 200 mg by mouth 2 (two) times daily.    . furosemide (LASIX) 40 MG tablet Take 40 mg by mouth as needed for fluid.    . hydrocortisone (ANUSOL-HC) 2.5 % rectal cream Place 1 application rectally 2 (two) times daily as needed for hemorrhoids or itching.    . levothyroxine (SYNTHROID, LEVOTHROID) 125 MCG tablet Take 125 mcg by mouth daily before breakfast.    . losartan (COZAAR) 50 MG tablet Take 1.5 tablets (75 mg total) by mouth at bedtime. 135 tablet 3  . Multiple Vitamins-Minerals (MULTIVITAMIN PO) Take 1 tablet by mouth at bedtime.     . nitroGLYCERIN (NITROSTAT) 0.4 MG SL tablet Place 0.4 mg under the tongue every 5 (five) minutes as needed for chest pain.    . pantoprazole (PROTONIX) 40 MG tablet Take 1 tablet (40 mg total) by mouth 2 (two) times daily. 180 tablet 3  . potassium chloride (K-DUR) 10 MEQ tablet Take 10 mEq by mouth as needed (when taking furosemide).    . simvastatin (ZOCOR) 80 MG tablet Take 0.5 tablets (40 mg total) by mouth at bedtime. 45 tablet 3  . tiotropium (SPIRIVA) 18 MCG inhalation capsule Place 1 capsule (18 mcg total) into inhaler and inhale daily. 90 capsule 3  . vitamin B-12 (CYANOCOBALAMIN) 1000 MCG tablet Take 1,000 mcg by mouth at bedtime.    . vitamin C (ASCORBIC ACID) 500 MG tablet Take 1 tablet (500 mg total) by mouth 2 (two) times daily. 180 tablet 3  . warfarin (COUMADIN) 5 MG tablet Take 2.5 mg on Mon and Fri. All other days take 5 mg . On  6/21 take 2.5 mg. . 200 tablet 3   No current facility-administered medications for this encounter.    Review of  patient's allergies indicates no known allergies.  REVIEW OF SYSTEMS: All systems negative except as listed in HPI, PMH and Problem list.   Filed Vitals:   09/05/15 1059 09/05/15 1100  BP: 110/0 108/64  Pulse: 50   Height: 6\' 1"  (1.854 m)   Weight: 228 lb (103.42 kg)   SpO2: 95%       HR: 50 Doppler MAP: 110  Auto cuff: 108/64 (82)  O2 Sat: 95% Home weights: 223-228 pounds  Last clinic wt::  Home wts: 219 - 220 lbs  Ht: 6'1"     GENERAL: Well appearing, male; NAD; wife present. Ambulated in the clinic without difficulty.  HEENT: normal  NECK: Supple, JVP ~10 no bruits.  No lymphadenopathy or thyromegaly appreciated.   CARDIAC: Mechanical heart sounds with LVAD hum present.  LUNGS:  Clear to auscultation bilaterally.  ABDOMEN:  Obese, Soft, round, nontender, positive bowel sounds x4.     LVAD exit site: well-healed and incorporated.  Dressing dry and intact.  No erythema, drainage, odor or tenderness.  Stabilization device present and accurately applied.  Driveline dressing is being changed daily per sterile technique. Changed in clinic EXTREMITIES:  Warm and dry, no cyanosis, clubbing, no edema NEUROLOGIC:  Alert and oriented x 4.  Gait steady.  No aphasia.  No dysarthria.  Affect pleasant.     ASSESSMENT AND PLAN:   1) Chronic Systolic HF: s/p LVAD implant 12/2012 for DT. - Doing well. NYHA II. Volume status mildly elevated. He is instructed to take lasix today with extra 20 meq of potassium.  MAP elevated but automated cuff 82.  - Not on BB due to previous bradycardia.  . Continue losartan 75 mg at bed time. Todays potassium is 4.0   - Reinforced the need and importance of daily weights, a low sodium diet, and fluid restriction (less than 2 L a day). Instructed to call the HF clinic if weight increases more than 3 lbs overnight or 5 lbs in a week.  2)  Anticoagulation management: INR goal 1.8-2.5 with history of GI bleeding.  He is not on ASA because of bleeding.    Continue coumadin.  See anticoagulation flow sheet. Had recent nose bleed and required evaluation [facility] ED.  INR today 2.24.  3) Anemia/ GIB: Most recent GI bleed 09/21/14. Had bleeding noted in jejunum requiring 3 clips.  Continue PPI and stool softener. No BRBPR.  CBC today Hgb 12.9   4 LVAD: No issues. All parameters within normal limits.  Continue weekly dressing changes.   - Check CBC, BMET, LDH and INR today.   5. PAF: Paroxysmal.  15% burden AF on 2/16 event monitor.  Today regular rate but could be going going in and out of A fib. Continue amiodarone 200 mg daily, recent LFTs and TSH normal.  Should have yearly eye exam.    6. Depression: Stable.  Continue Celexa 40 mg daily 7. OSA: Using CPAP.  8. Hypoxemia:  Uses home oxygen at night.  This may be due to his baseline COPD. 9. ICD infeciton - s/p extraction 6/16. No complications.    Follow up in 2 months.    -Ildefonso Keaney NP-C  09/05/2015

## 2015-09-06 ENCOUNTER — Telehealth: Payer: Self-pay | Admitting: *Deleted

## 2015-09-06 ENCOUNTER — Telehealth: Payer: Self-pay | Admitting: Hematology

## 2015-09-06 NOTE — Telephone Encounter (Signed)
Received call from pt's wife, [name] asking about iron infusion for pt, stating that ferritin was low per Dr Jeffie Pollock @ 46.  Returned call & informed that iron infusion is scheduled for Monday at 12:15 pm.  She expressed understanding & will have pt here.

## 2015-09-06 NOTE — Telephone Encounter (Signed)
per pof tos ch pt appt-sent MW email to sch pt trmt-will call pt after reply °

## 2015-09-06 NOTE — Telephone Encounter (Signed)
pt cld & left vm wanting to know about appts-cld pt back and left vm of time & date of 12/8 a[ppt °

## 2015-09-06 NOTE — Telephone Encounter (Signed)
Per staff message and POF I have scheduled appts. Advised scheduler of appts. JMW  

## 2015-09-11 ENCOUNTER — Ambulatory Visit (HOSPITAL_BASED_OUTPATIENT_CLINIC_OR_DEPARTMENT_OTHER): Payer: No Typology Code available for payment source

## 2015-09-11 VITALS — BP 110/74 | HR 69 | Temp 97.0°F | Resp 20

## 2015-09-11 DIAGNOSIS — D62 Acute posthemorrhagic anemia: Secondary | ICD-10-CM

## 2015-09-11 MED ORDER — SODIUM CHLORIDE 0.9 % IV SOLN
Freq: Once | INTRAVENOUS | Status: AC
  Administered 2015-09-11: 15:00:00 via INTRAVENOUS

## 2015-09-11 MED ORDER — SODIUM CHLORIDE 0.9 % IV SOLN
510.0000 mg | Freq: Once | INTRAVENOUS | Status: AC
  Administered 2015-09-11: 510 mg via INTRAVENOUS
  Filled 2015-09-11: qty 17

## 2015-09-11 NOTE — Patient Instructions (Signed)

## 2015-09-21 ENCOUNTER — Telehealth (HOSPITAL_COMMUNITY): Payer: Self-pay | Admitting: Infectious Diseases

## 2015-09-21 ENCOUNTER — Other Ambulatory Visit (HOSPITAL_COMMUNITY): Payer: Non-veteran care

## 2015-09-21 NOTE — Telephone Encounter (Signed)
Called to check on them since INR appointment was cancelled. They are both very sick (GI/URI) and have rescheduled their lab appointment for 12/30. Appreciated the phone call.

## 2015-09-29 ENCOUNTER — Other Ambulatory Visit (HOSPITAL_COMMUNITY): Payer: Non-veteran care

## 2015-09-29 ENCOUNTER — Ambulatory Visit (HOSPITAL_COMMUNITY): Payer: Self-pay | Admitting: Infectious Diseases

## 2015-09-29 ENCOUNTER — Ambulatory Visit (HOSPITAL_COMMUNITY)
Admission: RE | Admit: 2015-09-29 | Discharge: 2015-09-29 | Disposition: A | Discharge status: Home / Self Care | Payer: Non-veteran care | Source: Ambulatory Visit | Attending: Cardiology | Admitting: Cardiology

## 2015-09-29 DIAGNOSIS — Z7901 Long term (current) use of anticoagulants: Secondary | ICD-10-CM

## 2015-09-29 DIAGNOSIS — D509 Iron deficiency anemia, unspecified: Secondary | ICD-10-CM

## 2015-09-29 DIAGNOSIS — Z5181 Encounter for therapeutic drug level monitoring: Secondary | ICD-10-CM | POA: Insufficient documentation

## 2015-09-29 DIAGNOSIS — I482 Chronic atrial fibrillation, unspecified: Secondary | ICD-10-CM

## 2015-09-29 DIAGNOSIS — Z95811 Presence of heart assist device: Secondary | ICD-10-CM

## 2015-09-29 LAB — PROTIME-INR
INR: 2.59 — ABNORMAL HIGH (ref 0.00–1.49)
PROTHROMBIN TIME: 27.4 s — AB (ref 11.6–15.2)

## 2015-10-09 ENCOUNTER — Telehealth (HOSPITAL_COMMUNITY): Payer: Self-pay | Admitting: Infectious Diseases

## 2015-10-09 ENCOUNTER — Other Ambulatory Visit (HOSPITAL_COMMUNITY): Payer: Non-veteran care

## 2015-10-09 NOTE — Telephone Encounter (Signed)
Call received from Fay--Areli has nasal & chest congestion and she is wondering what she can give him. Symptoms described to have come on suddenly in the last day or so.' Denies fevers, sore throat, productive cough, chills/myalgias. Mucinex and Coricidin advised. Also OTC cough syrup such as Delsym should he develop a cough. Tylenol for any aches and pains that may develop. Advised against any decongestants with pseudoephedrine or phenylephrine. Advised to seek care should he start developing fevers/myalgias, severe worsening or prolonged symptoms >10days. Verbalized understanding of the same.

## 2015-10-11 ENCOUNTER — Ambulatory Visit (HOSPITAL_COMMUNITY)
Admission: RE | Admit: 2015-10-11 | Discharge: 2015-10-11 | Disposition: A | Discharge status: Home / Self Care | Payer: No Typology Code available for payment source | Source: Ambulatory Visit | Attending: Internal Medicine | Admitting: Internal Medicine

## 2015-10-11 ENCOUNTER — Ambulatory Visit (HOSPITAL_COMMUNITY): Payer: Self-pay | Admitting: *Deleted

## 2015-10-11 DIAGNOSIS — Z95811 Presence of heart assist device: Secondary | ICD-10-CM | POA: Diagnosis not present

## 2015-10-11 DIAGNOSIS — Z7901 Long term (current) use of anticoagulants: Secondary | ICD-10-CM

## 2015-10-11 DIAGNOSIS — I482 Chronic atrial fibrillation, unspecified: Secondary | ICD-10-CM

## 2015-10-11 DIAGNOSIS — D509 Iron deficiency anemia, unspecified: Secondary | ICD-10-CM | POA: Insufficient documentation

## 2015-10-11 LAB — CBC WITH DIFFERENTIAL/PLATELET
BASOS ABS: 0 10*3/uL (ref 0.0–0.1)
Basophils Relative: 0 %
EOS PCT: 5 %
Eosinophils Absolute: 0.3 10*3/uL (ref 0.0–0.7)
HEMATOCRIT: 38.2 % — AB (ref 39.0–52.0)
Hemoglobin: 12.1 g/dL — ABNORMAL LOW (ref 13.0–17.0)
Lymphocytes Relative: 17 %
Lymphs Abs: 0.8 10*3/uL (ref 0.7–4.0)
MCH: 32.2 pg (ref 26.0–34.0)
MCHC: 31.7 g/dL (ref 30.0–36.0)
MCV: 101.6 fL — AB (ref 78.0–100.0)
MONO ABS: 0.5 10*3/uL (ref 0.1–1.0)
Monocytes Relative: 9 %
NEUTROS ABS: 3.3 10*3/uL (ref 1.7–7.7)
Neutrophils Relative %: 69 %
PLATELETS: 132 10*3/uL — AB (ref 150–400)
RBC: 3.76 MIL/uL — AB (ref 4.22–5.81)
RDW: 16 % — ABNORMAL HIGH (ref 11.5–15.5)
WBC: 4.8 10*3/uL (ref 4.0–10.5)

## 2015-10-11 LAB — IRON AND TIBC
Iron: 36 ug/dL — ABNORMAL LOW (ref 45–182)
SATURATION RATIOS: 12 % — AB (ref 17.9–39.5)
TIBC: 307 ug/dL (ref 250–450)
UIBC: 271 ug/dL

## 2015-10-11 LAB — PROTIME-INR
INR: 2.87 — AB (ref 0.00–1.49)
PROTHROMBIN TIME: 29.6 s — AB (ref 11.6–15.2)

## 2015-10-11 LAB — FERRITIN: Ferritin: 49 ng/mL (ref 24–336)

## 2015-10-12 ENCOUNTER — Telehealth: Payer: Self-pay | Admitting: *Deleted

## 2015-10-12 ENCOUNTER — Other Ambulatory Visit: Payer: Self-pay | Admitting: Hematology

## 2015-10-12 NOTE — Telephone Encounter (Signed)
Called & left message on [name]'s ph that Dr Burr Medico did want her husband to get iron infusion & scheduling should call her to schedule.

## 2015-10-12 NOTE — Telephone Encounter (Signed)
Received message from wife re:  Pt had labs done 10/11/15 at [facility] , and noted Low iron level.   Wife would like to know if Dr. Burr Medico would want pt to receive iron infusion. Faye's    Phone      [phone].

## 2015-10-15 ENCOUNTER — Telehealth: Payer: Self-pay | Admitting: Hematology

## 2015-10-15 NOTE — Telephone Encounter (Signed)
Wife aware of 1/18 iv iron appt  anne

## 2015-10-18 ENCOUNTER — Ambulatory Visit (HOSPITAL_BASED_OUTPATIENT_CLINIC_OR_DEPARTMENT_OTHER): Payer: Non-veteran care

## 2015-10-18 VITALS — BP 102/64 | HR 57 | Temp 97.6°F | Resp 20

## 2015-10-18 DIAGNOSIS — K922 Gastrointestinal hemorrhage, unspecified: Secondary | ICD-10-CM

## 2015-10-18 DIAGNOSIS — D500 Iron deficiency anemia secondary to blood loss (chronic): Secondary | ICD-10-CM | POA: Diagnosis not present

## 2015-10-18 DIAGNOSIS — D62 Acute posthemorrhagic anemia: Secondary | ICD-10-CM

## 2015-10-18 MED ORDER — SODIUM CHLORIDE 0.9 % IV SOLN
Freq: Once | INTRAVENOUS | Status: AC
  Administered 2015-10-18: 09:00:00 via INTRAVENOUS

## 2015-10-18 MED ORDER — SODIUM CHLORIDE 0.9 % IV SOLN
510.0000 mg | Freq: Once | INTRAVENOUS | Status: AC
  Administered 2015-10-18: 510 mg via INTRAVENOUS
  Filled 2015-10-18: qty 17

## 2015-10-18 NOTE — Progress Notes (Signed)
Patient denies any sx of low blood pressure .  Discussed fluid intake and caffeine.

## 2015-10-18 NOTE — Patient Instructions (Signed)

## 2015-10-20 ENCOUNTER — Telehealth: Payer: Self-pay | Admitting: *Deleted

## 2015-10-20 DIAGNOSIS — Z792 Long term (current) use of antibiotics: Secondary | ICD-10-CM

## 2015-10-20 DIAGNOSIS — Z95811 Presence of heart assist device: Secondary | ICD-10-CM

## 2015-10-20 MED ORDER — AMOXICILLIN 500 MG PO TABS: ORAL_TABLET | ORAL | Status: DC

## 2015-10-20 NOTE — Telephone Encounter (Signed)
Wife called to report pt saw Dr. Clydie Braun (dermatology) for lesion behind ear. 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 Wife is asking if prophylactic antibiotic is needed prior to surgical procedure (will be local anesthesia). Informed her per Dr. Haroldine Laws that pt should take Amoxicillin 2 gms thirty mins - 1 hour prior to procedure. Rx sent. Wife verbalized understanding of same.

## 2015-10-25 ENCOUNTER — Ambulatory Visit (HOSPITAL_COMMUNITY): Payer: Self-pay | Admitting: Infectious Diseases

## 2015-10-25 ENCOUNTER — Ambulatory Visit (HOSPITAL_COMMUNITY)
Admission: RE | Admit: 2015-10-25 | Discharge: 2015-10-25 | Disposition: A | Discharge status: Home / Self Care | Payer: Non-veteran care | Source: Ambulatory Visit | Attending: Internal Medicine | Admitting: Internal Medicine

## 2015-10-25 ENCOUNTER — Telehealth (HOSPITAL_COMMUNITY): Payer: Self-pay | Admitting: Infectious Diseases

## 2015-10-25 ENCOUNTER — Ambulatory Visit (HOSPITAL_BASED_OUTPATIENT_CLINIC_OR_DEPARTMENT_OTHER): Payer: Non-veteran care

## 2015-10-25 VITALS — BP 92/66 | HR 73 | Temp 97.6°F

## 2015-10-25 DIAGNOSIS — Z7901 Long term (current) use of anticoagulants: Secondary | ICD-10-CM | POA: Diagnosis not present

## 2015-10-25 DIAGNOSIS — Z95811 Presence of heart assist device: Secondary | ICD-10-CM | POA: Diagnosis not present

## 2015-10-25 DIAGNOSIS — D500 Iron deficiency anemia secondary to blood loss (chronic): Secondary | ICD-10-CM

## 2015-10-25 DIAGNOSIS — D62 Acute posthemorrhagic anemia: Secondary | ICD-10-CM

## 2015-10-25 DIAGNOSIS — K922 Gastrointestinal hemorrhage, unspecified: Secondary | ICD-10-CM | POA: Diagnosis not present

## 2015-10-25 DIAGNOSIS — Z5181 Encounter for therapeutic drug level monitoring: Secondary | ICD-10-CM | POA: Insufficient documentation

## 2015-10-25 LAB — PROTIME-INR
INR: 2.43 — AB (ref 0.00–1.49)
PROTHROMBIN TIME: 26.1 s — AB (ref 11.6–15.2)

## 2015-10-25 MED ORDER — SODIUM CHLORIDE 0.9 % IV SOLN
510.0000 mg | Freq: Once | INTRAVENOUS | Status: AC
  Administered 2015-10-25: 510 mg via INTRAVENOUS
  Filled 2015-10-25: qty 17

## 2015-10-25 NOTE — Telephone Encounter (Signed)
Called from [place] reporting that [name] is having some increased SOB--found himself to be very winded recently with walking around prior to going for his iron infusion. Concerned that he is back in afib. HR this AM was 73 per records at [facility] today. Denies any weight changes and has not needed to take a diuretic "in some time;" denies cough or symptoms at rest and does not report feeling ill and feels he has been eating adequately.   PI reported to be 5.7 which is near baseline for him--last several clinic visits has been 6.5 - 7). Encouraged him to rest and drink an extra glass or two of fluid this evening. She initially thought to give him a diuretic, however with PI what it is and possible AFib w/ RVR advised her that I would hold off.   Advised to report to ED if he gets worse or starts with symptoms at rest. Verbalized understanding of the above.

## 2015-10-25 NOTE — Patient Instructions (Signed)

## 2015-11-07 ENCOUNTER — Encounter: Payer: Self-pay | Admitting: *Deleted

## 2015-11-07 ENCOUNTER — Ambulatory Visit (HOSPITAL_COMMUNITY)
Admission: RE | Admit: 2015-11-07 | Discharge: 2015-11-07 | Disposition: A | Discharge status: Home / Self Care | Payer: No Typology Code available for payment source | Source: Ambulatory Visit | Attending: Internal Medicine | Admitting: Internal Medicine

## 2015-11-07 ENCOUNTER — Other Ambulatory Visit (HOSPITAL_COMMUNITY): Payer: Self-pay | Admitting: Adult Health

## 2015-11-07 ENCOUNTER — Encounter (HOSPITAL_COMMUNITY): Payer: Self-pay | Admitting: Physician Assistant

## 2015-11-07 ENCOUNTER — Inpatient Hospital Stay (HOSPITAL_COMMUNITY)
Admission: AD | Admit: 2015-11-07 | Discharge: 2015-11-14 | DRG: 812 | Disposition: A | Discharge status: Home / Self Care | Payer: No Typology Code available for payment source | Source: Ambulatory Visit | Attending: Internal Medicine | Admitting: Internal Medicine

## 2015-11-07 ENCOUNTER — Other Ambulatory Visit (HOSPITAL_COMMUNITY): Payer: Self-pay | Admitting: *Deleted

## 2015-11-07 DIAGNOSIS — E039 Hypothyroidism, unspecified: Secondary | ICD-10-CM | POA: Diagnosis present

## 2015-11-07 DIAGNOSIS — I5022 Chronic systolic (congestive) heart failure: Secondary | ICD-10-CM | POA: Diagnosis present

## 2015-11-07 DIAGNOSIS — I110 Hypertensive heart disease with heart failure: Secondary | ICD-10-CM | POA: Diagnosis present

## 2015-11-07 DIAGNOSIS — Z95811 Presence of heart assist device: Secondary | ICD-10-CM

## 2015-11-07 DIAGNOSIS — J449 Chronic obstructive pulmonary disease, unspecified: Secondary | ICD-10-CM | POA: Diagnosis present

## 2015-11-07 DIAGNOSIS — I480 Paroxysmal atrial fibrillation: Secondary | ICD-10-CM | POA: Diagnosis present

## 2015-11-07 DIAGNOSIS — D500 Iron deficiency anemia secondary to blood loss (chronic): Secondary | ICD-10-CM | POA: Diagnosis present

## 2015-11-07 DIAGNOSIS — D62 Acute posthemorrhagic anemia: Secondary | ICD-10-CM

## 2015-11-07 DIAGNOSIS — Z951 Presence of aortocoronary bypass graft: Secondary | ICD-10-CM | POA: Diagnosis not present

## 2015-11-07 DIAGNOSIS — Z7901 Long term (current) use of anticoagulants: Secondary | ICD-10-CM

## 2015-11-07 DIAGNOSIS — K649 Unspecified hemorrhoids: Secondary | ICD-10-CM | POA: Diagnosis present

## 2015-11-07 DIAGNOSIS — G4733 Obstructive sleep apnea (adult) (pediatric): Secondary | ICD-10-CM | POA: Diagnosis present

## 2015-11-07 DIAGNOSIS — I2510 Atherosclerotic heart disease of native coronary artery without angina pectoris: Secondary | ICD-10-CM | POA: Diagnosis present

## 2015-11-07 DIAGNOSIS — F329 Major depressive disorder, single episode, unspecified: Secondary | ICD-10-CM | POA: Diagnosis present

## 2015-11-07 DIAGNOSIS — R0600 Dyspnea, unspecified: Secondary | ICD-10-CM

## 2015-11-07 DIAGNOSIS — Z95810 Presence of automatic (implantable) cardiac defibrillator: Secondary | ICD-10-CM

## 2015-11-07 DIAGNOSIS — I10 Essential (primary) hypertension: Secondary | ICD-10-CM

## 2015-11-07 DIAGNOSIS — Z79899 Other long term (current) drug therapy: Secondary | ICD-10-CM | POA: Diagnosis not present

## 2015-11-07 DIAGNOSIS — K625 Hemorrhage of anus and rectum: Secondary | ICD-10-CM | POA: Diagnosis not present

## 2015-11-07 DIAGNOSIS — K5520 Angiodysplasia of colon without hemorrhage: Secondary | ICD-10-CM | POA: Insufficient documentation

## 2015-11-07 DIAGNOSIS — I4891 Unspecified atrial fibrillation: Secondary | ICD-10-CM | POA: Diagnosis not present

## 2015-11-07 DIAGNOSIS — K921 Melena: Secondary | ICD-10-CM | POA: Diagnosis not present

## 2015-11-07 DIAGNOSIS — I252 Old myocardial infarction: Secondary | ICD-10-CM | POA: Diagnosis not present

## 2015-11-07 DIAGNOSIS — I255 Ischemic cardiomyopathy: Secondary | ICD-10-CM | POA: Diagnosis present

## 2015-11-07 DIAGNOSIS — E785 Hyperlipidemia, unspecified: Secondary | ICD-10-CM | POA: Diagnosis present

## 2015-11-07 DIAGNOSIS — K7460 Unspecified cirrhosis of liver: Secondary | ICD-10-CM | POA: Diagnosis present

## 2015-11-07 DIAGNOSIS — D649 Anemia, unspecified: Secondary | ICD-10-CM

## 2015-11-07 DIAGNOSIS — K558 Other vascular disorders of intestine: Secondary | ICD-10-CM | POA: Diagnosis not present

## 2015-11-07 DIAGNOSIS — Z87891 Personal history of nicotine dependence: Secondary | ICD-10-CM

## 2015-11-07 DIAGNOSIS — Z8249 Family history of ischemic heart disease and other diseases of the circulatory system: Secondary | ICD-10-CM | POA: Diagnosis not present

## 2015-11-07 DIAGNOSIS — I481 Persistent atrial fibrillation: Secondary | ICD-10-CM | POA: Diagnosis not present

## 2015-11-07 DIAGNOSIS — E669 Obesity, unspecified: Secondary | ICD-10-CM | POA: Diagnosis present

## 2015-11-07 DIAGNOSIS — I5020 Unspecified systolic (congestive) heart failure: Secondary | ICD-10-CM

## 2015-11-07 HISTORY — DX: Adverse effect of unspecified anesthetic, initial encounter: T41.45XA

## 2015-11-07 HISTORY — DX: Other complications of anesthesia, initial encounter: T88.59XA

## 2015-11-07 HISTORY — DX: Malignant (primary) neoplasm, unspecified: C80.1

## 2015-11-07 HISTORY — DX: Anemia, unspecified: D64.9

## 2015-11-07 HISTORY — DX: Personal history of other medical treatment: Z92.89

## 2015-11-07 HISTORY — DX: Epistaxis: R04.0

## 2015-11-07 LAB — CBC WITH DIFFERENTIAL/PLATELET
BASOS ABS: 0 10*3/uL (ref 0.0–0.1)
BASOS PCT: 0 %
EOS ABS: 0.2 10*3/uL (ref 0.0–0.7)
Eosinophils Relative: 2 %
HEMATOCRIT: 30.2 % — AB (ref 39.0–52.0)
HEMOGLOBIN: 8.8 g/dL — AB (ref 13.0–17.0)
Lymphocytes Relative: 10 %
Lymphs Abs: 0.7 10*3/uL (ref 0.7–4.0)
MCH: 30.3 pg (ref 26.0–34.0)
MCHC: 29.1 g/dL — ABNORMAL LOW (ref 30.0–36.0)
MCV: 104.1 fL — ABNORMAL HIGH (ref 78.0–100.0)
MONO ABS: 0.7 10*3/uL (ref 0.1–1.0)
Monocytes Relative: 10 %
NEUTROS ABS: 5.8 10*3/uL (ref 1.7–7.7)
NEUTROS PCT: 78 %
Platelets: 179 10*3/uL (ref 150–400)
RBC: 2.9 MIL/uL — ABNORMAL LOW (ref 4.22–5.81)
RDW: 20.2 % — AB (ref 11.5–15.5)
WBC: 7.4 10*3/uL (ref 4.0–10.5)

## 2015-11-07 LAB — BASIC METABOLIC PANEL
ANION GAP: 10 (ref 5–15)
BUN: 18 mg/dL (ref 6–20)
CHLORIDE: 105 mmol/L (ref 101–111)
CO2: 25 mmol/L (ref 22–32)
Calcium: 9.7 mg/dL (ref 8.9–10.3)
Creatinine, Ser: 1.12 mg/dL (ref 0.61–1.24)
GFR calc non Af Amer: >60 mL/min (ref >60)
Glucose, Bld: 116 mg/dL — ABNORMAL HIGH (ref 65–99)
Potassium: 4.2 mmol/L (ref 3.5–5.1)
SODIUM: 140 mmol/L (ref 135–145)

## 2015-11-07 LAB — FERRITIN: Ferritin: 31 ng/mL (ref 24–336)

## 2015-11-07 LAB — LACTATE DEHYDROGENASE: LDH: 209 U/L — AB (ref 98–192)

## 2015-11-07 LAB — PROTIME-INR
INR: 2.13 — AB (ref 0.00–1.49)
PROTHROMBIN TIME: 23.7 s — AB (ref 11.6–15.2)

## 2015-11-07 LAB — IRON AND TIBC
IRON: 25 ug/dL — AB (ref 45–182)
SATURATION RATIOS: 7 % — AB (ref 17.9–39.5)
TIBC: 346 ug/dL (ref 250–450)
UIBC: 321 ug/dL

## 2015-11-07 LAB — PREPARE RBC (CROSSMATCH)

## 2015-11-07 LAB — BRAIN NATRIURETIC PEPTIDE: B Natriuretic Peptide: 398.1 pg/mL — ABNORMAL HIGH (ref 0.0–100.0)

## 2015-11-07 MED ORDER — PANTOPRAZOLE SODIUM 40 MG PO TBEC
40.0000 mg | DELAYED_RELEASE_TABLET | Freq: Two times a day (BID) | ORAL | Status: DC
  Administered 2015-11-07 – 2015-11-14 (×14): 40 mg via ORAL
  Filled 2015-11-07 (×15): qty 1

## 2015-11-07 MED ORDER — CITALOPRAM HYDROBROMIDE 20 MG PO TABS
40.0000 mg | ORAL_TABLET | Freq: Every day | ORAL | Status: DC
  Administered 2015-11-07 – 2015-11-14 (×8): 40 mg via ORAL
  Filled 2015-11-07 (×10): qty 2

## 2015-11-07 MED ORDER — LEVOTHYROXINE SODIUM 25 MCG PO TABS
125.0000 ug | ORAL_TABLET | Freq: Every day | ORAL | Status: DC
  Administered 2015-11-08 – 2015-11-14 (×7): 125 ug via ORAL
  Filled 2015-11-07 (×7): qty 1

## 2015-11-07 MED ORDER — SODIUM CHLORIDE 0.9 % IV SOLN: INTRAVENOUS | Status: DC

## 2015-11-07 MED ORDER — TIOTROPIUM BROMIDE MONOHYDRATE 18 MCG IN CAPS
18.0000 ug | ORAL_CAPSULE | Freq: Every day | RESPIRATORY_TRACT | Status: DC
  Administered 2015-11-08 – 2015-11-12 (×5): 18 ug via RESPIRATORY_TRACT
  Filled 2015-11-07 (×3): qty 5

## 2015-11-07 MED ORDER — SIMVASTATIN 40 MG PO TABS
40.0000 mg | ORAL_TABLET | Freq: Every day | ORAL | Status: DC
  Administered 2015-11-07: 40 mg via ORAL
  Filled 2015-11-07: qty 1

## 2015-11-07 MED ORDER — DOCUSATE SODIUM 100 MG PO CAPS
100.0000 mg | ORAL_CAPSULE | Freq: Two times a day (BID) | ORAL | Status: DC
  Administered 2015-11-07 – 2015-11-14 (×14): 100 mg via ORAL
  Filled 2015-11-07 (×14): qty 1

## 2015-11-07 MED ORDER — AMIODARONE HCL 200 MG PO TABS
200.0000 mg | ORAL_TABLET | Freq: Every day | ORAL | Status: DC
  Administered 2015-11-08 – 2015-11-12 (×5): 200 mg via ORAL
  Filled 2015-11-07 (×6): qty 1

## 2015-11-07 MED ORDER — SODIUM CHLORIDE 0.9 % IV SOLN
Freq: Once | INTRAVENOUS | Status: AC
  Administered 2015-11-09: 13:00:00 via INTRAVENOUS

## 2015-11-07 NOTE — Progress Notes (Signed)
Utilization review completed.  

## 2015-11-07 NOTE — Progress Notes (Signed)
Symptom  Yes  No  Details   Angina         x Activity:   Claudication                x How far:    Syncope         x When:  Orthostatic dizziness and weakness x 3 weeks  Stroke         x   Orthopnea         x How many pillows:   2 for comfort  PND         x How often:  CPAP         x  How many hrs:  All night  Pedal edema         x   Abd fullness         x   N&V         x   Diaphoresis         x        When:  Occasionally night sweats 2 - 3 x month  Bleeding        x        Dark stools x one week; nosebleed x 1 - resolved with Afrin and nasal packing  Urine color    medium yellow  SOB        x  Activity:  With any activity along with fatigue - started 3 weeks ago  Palpitations         x When:  ICD shock         N/A ICD explanted 03/16/15  Hospitlizaitons               x When/where/why:    ED visit               x When/where/why:    Other MD         x        When/who/why:  IV iron 10/25/15  Activity    No formal activity; sedentary  Fluid    No limitations  Diet    No limitations   Vital signs: HR:  81 Doppler modified systolic:  123XX123 Auto cuff:  99/72 (84) O2 Sat: 96%  Wt:  231.4 lbs Last wt:  226.4 lbs Home wts:  224 - 228 lbs  Ht: 6'1"  LVAD interrogation reveals:  Speed: 9200 Flow:  5.3 Power: 5.4 PI:  5.1 Alarms: none Events: 0 - 5 PI events        Fixed speed:  9200 Low speed limit: 8600  Primary Controller:  Replace back up battery in 7 months  Back up controller:   Replace back up battery in 7 months   LVAD exit site:  Well healed and incorporated. The velour is fully implanted at exit site. Dressing dry and intact. No erythema or drainage. Stabilization device present and accurately applied. Driveline dressing is being changed weekly per sterile technique using Sorbaview dressing with biopatch on exit site. Pt denies fever or chills. Dressing supplies provided to patient.   Pt with wife for clinic visit today. Pt c/o increased SOB and fatigue for last 3 weeks,  fears he is in Afib. EKG obtained, Dr. Haroldine Laws reviewed. Hgb drop of 3 gms over last month, pt reports he has had dark stools x 1 week. Will admit for GI consult per Dr. Haroldine Laws.   VAD dressing changes are being performed weekly by wife. Next dressing  change will be due Sunday, 11/11/14.   Will forward the results of Iron studies to Dr. Burr Medico as requested. No schedulef FU OV with her until March 2017.     Zada Girt, RN VAD Coordinator  Office: [phone] 24/7 VAD Pager: [phone]

## 2015-11-07 NOTE — Progress Notes (Unsigned)
VAD coordinator transported pt from [facility] [facility] clinic to 2W20 via [employer]. VAD cart taken to room. Report given to bedside nurse. Wife at bedside.

## 2015-11-07 NOTE — Consult Note (Signed)
[facility] Consult: 3:00 PM 11/07/2015  LOS: 0 days    Referring Provider: Dr Haroldine Laws.  Primary Care Physician:  Thressa Sheller, MD Primary Gastroenterologist:  Dr. Deatra Ina.  ? Dr Carlean Purl     Hematologist: Dr Burr Medico.    Reason for Consultation:  Recurrent blood loss anemia and dark stools.    HPI: [name] is a 75 y.o. male.  PMH CAD s/p CABG (123456), chronic systolic HF s/p ICD, hyperlipidemia, hypothyroidism, emphysema, OSA and PAF. COPD, asbestosis.  Quit smoking 2003. Uses CPAP and O2 every night. LVAD (destination) implant 01/11/13. Hx Afib; DCCV on 09/21/2014. 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 CT abdomen of 02/2014 and CT chest of 12/2012 showed cirrhosis.  Gallstones noted on CT abdomen 02/2014. Chronic Coumadin.   Multiple endoscopic studies for blood loss anemia, GI bleeding.  09/27/2014 Enteroscopy: Dr Carlean Purl Area of bleeding in jejunum at extent of scope insertion, treated APC and bleeding slowed.  3 clips placed. Some ooze of blood after that. Exact site not seen but presumed AVM in this situation. " ...if persistent bleeding suspected: rescope and retreat off anti-coag... EGD and colonoscopy 03/09/2014. (Dr Carlean Purl) Below:6 mm polyp clipped and removed, internal rrhoids, mild Above: erosions at [employer]. Enteroscopy 08/01/14. Dr Hilarie Fredrickson. 1. The mucosa of the esophagus appeared normal 2. Small hiatal hernia 3. The mucosa of the stomach appeared normal 4. 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 Otherwise normal duodenum 6. The exam showed no abnormalities in the examined portions of the proximal jejunum Capsule  endo 07/30/2014: bleeding in duodenum. Melenic fluid distal to this.  Colonoscopy 07/29/14.  ENDOSCOPIC IMPRESSION: 1. Two sessile polyps ranging from 3 to 36mm in size were found in the right colon 2. The colon examination was otherwise normal with fair prep after extensive irrigation and suctioning 3. Internal hemorrhoids in rectum Enteroscopy 07/27/14.  IMPRESSIONS: Irregular Z-line and small hiatal hernia but otherwise normal esophagus, stomach, duodenum and proximal jejunum Capsule endo 03/10/2014: Gastric and small bowel angioectasia, inflammation at 2 hours 40 minutes  Feraheme infusions at cancer center 10/2014 x 2, 01/2015,  03/2015 x 2, 09/11/15, 10/18/15, 10/25/15.  PRBC transfusion 09/2014, 02/2014, 01/2014, 08/2013, 12/2012  Pt admitted from heart failure clinic today with Hgb 8.8, was 12.1 on 10/11/15.  MCV is 104, was 101.  Iron 25, ferritin 7; 36 and 12 10/11/15.  INR is 2.1, goal for him is 1.8 - 2.3.  GFR > 60.    Heart rate is back in a fib.  Pt having increased fatigue, decreased exercise tolerance, increasing DOE.  Stools black for several days last week, but had been using Bismuth for diarrhea, also uses MOM for constipation. Stool yesterday was brown. Some fullness in upper abdomen but not pyrosis.  No nausea.  No blood in stool.  Had epistaxis on 2/1, wife treated this with a tampon and afrin and bleeding subsided.  Past Medical History  Diagnosis Date  . Ischemic cardiomyopathy      CABG 2003, PCI 2007  EF 27%(myoview 2012  . Chronic systolic heart failure ([place])   . Hyperlipidemia   . Hypothyroidism   . Chronic anticoagulation     Afib and LVAD  . Obesity   . COPD (chronic obstructive pulmonary disease) ([place])   . Asbestosis([place])     "6 years in the [place]" (05/24/2013)  . Atrial fibrillation ([place])     permanent  . Paroxysmal ventricular tachycardia ([place])   . Coronary artery disease   . Implantable cardioverter-defibrillator [employer]   .  Hypertension   . Myocardial infarction ([place]) 1990's-2000    "2 in  ~ the 1990's; 1 in ~ 2000" (05/24/2013)  . OSA on CPAP   . Depression   . LVAD (left ventricular assist device) present ([place]) 12/2012    Past Surgical History  Procedure Laterality Date  . Coronary artery bypass graft  2003    "?X4" (05/24/2013)  . Cardiac defibrillator placement  2004; ~ 2010  . Insertion of implantable left ventricular assist device N/A 01/12/2013    Procedure: INSERTION OF IMPLANTABLE LEFT VENTRICULAR ASSIST DEVICE;  Surgeon: Ivin Poot, MD;  Location: [place];  Service: Open Heart Surgery;  Laterality: N/A;   nitric oxide; Redo sternotomy  . Intraoperative transesophageal echocardiogram N/A 01/12/2013    Procedure: INTRAOPERATIVE TRANSESOPHAGEAL ECHOCARDIOGRAM;  Surgeon: Ivin Poot, MD;  Location: [place];  Service: Open Heart Surgery;  Laterality: N/A;  . Colonoscopy N/A 03/09/2014    Procedure: COLONOSCOPY;  Surgeon: Inda Castle, MD;  Location: [place];  Service: Endoscopy;  Laterality: N/A;  LVAD  patient  . Esophagogastroduodenoscopy N/A 03/09/2014    Procedure: ESOPHAGOGASTRODUODENOSCOPY (EGD);  Surgeon: Inda Castle, MD;  Location: [place];  Service: Endoscopy;  Laterality: N/A;  . Givens capsule study N/A 03/10/2014    Procedure: GIVENS CAPSULE STUDY;  Surgeon: Inda Castle, MD;  Location: [place];  Service: Endoscopy;  Laterality: N/A;  . Enteroscopy N/A 07/27/2014    Procedure: ENTEROSCOPY;  Surgeon: Gatha Mayer, MD;  Location: [place];  Service: Endoscopy;  Laterality: N/A;  LVAD patient   . Colonoscopy N/A 07/29/2014    Procedure: COLONOSCOPY;  Surgeon: Gatha Mayer, MD;  Location: [place];  Service: Endoscopy;  Laterality: N/A;  . Givens capsule study N/A 07/30/2014    Procedure: GIVENS CAPSULE STUDY;  Surgeon: Gatha Mayer, MD;  Location: [place];  Service: Endoscopy;  Laterality: N/A;  . Esophagogastroduodenoscopy N/A 08/01/2014    Procedure:  ESOPHAGOGASTRODUODENOSCOPY (EGD);  Surgeon: Jerene Bears, MD;  Location: [facility] ENDOSCOPY;  Service: Endoscopy;  Laterality: N/A;  LVAD patient  . Left and right heart catheterization with coronary/graft angiogram  01/07/2013    Procedure: LEFT AND RIGHT HEART CATHETERIZATION WITH Beatrix Fetters;  Surgeon: Jolaine Artist, MD;  Location: [facility] CATH LAB;  Service: Cardiovascular;;  . Enteroscopy N/A 09/27/2014    Procedure: ENTEROSCOPY;  Surgeon: Gatha Mayer, MD;  Location: [facility] ENDOSCOPY;  Service: Endoscopy;  Laterality: N/A;  . Icd lead removal Left 03/16/2015    Procedure: ICD LEAD REMOVAL/EXTRACTION;  Surgeon: Evans Lance, MD;  Location: [place];  Service: Cardiovascular;  Laterality: Left;  PATIENT HAS LVAD  DR. VAN TRIGT TO BACK UP EXTRACTION    Prior to Admission medications   Medication Sig Start Date End Date Taking? Authorizing Provider  budesonide-formoterol (SYMBICORT) 160-4.5 MCG/ACT inhaler Inhale 2 puffs into the lungs  2 (two) times daily. 01/10/15  Yes Jolaine Artist, MD  cholecalciferol (VITAMIN D) 1000 UNITS tablet Take 1 tablet (1,000 Units total) by mouth 2 (two) times daily. 01/10/15  Yes Jolaine Artist, MD  citalopram (CELEXA) 40 MG tablet Take 1 tablet (40 mg total) by mouth at bedtime. 01/10/15  Yes Jolaine Artist, MD  furosemide (LASIX) 40 MG tablet Take 40 mg by mouth as needed for fluid.   Yes Historical Provider, MD  levothyroxine (SYNTHROID, LEVOTHROID) 125 MCG tablet Take 125 mcg by mouth daily before breakfast.   Yes Historical Provider, MD  losartan (COZAAR) 50 MG tablet Take 1.5 tablets (75 mg total) by mouth at bedtime. 01/10/15  Yes Jolaine Artist, MD  pantoprazole (PROTONIX) 40 MG tablet Take 1 tablet (40 mg total) by mouth 2 (two) times daily. 01/10/15  Yes Jolaine Artist, MD  potassium chloride (K-DUR) 10 MEQ tablet Take 10 mEq by mouth as needed (when taking furosemide).   Yes Historical Provider, MD  simvastatin (ZOCOR) 80 MG tablet  Take 0.5 tablets (40 mg total) by mouth at bedtime. 01/10/15  Yes Jolaine Artist, MD  tiotropium (SPIRIVA) 18 MCG inhalation capsule Place 1 capsule (18 mcg total) into inhaler and inhale daily. 01/10/15  Yes Jolaine Artist, MD  vitamin B-12 (CYANOCOBALAMIN) 1000 MCG tablet Take 1,000 mcg by mouth at bedtime.   Yes Historical Provider, MD  vitamin C (ASCORBIC ACID) 500 MG tablet Take 1 tablet (500 mg total) by mouth 2 (two) times daily. 01/10/15  Yes Jolaine Artist, MD  Albuterol (PROVENTIL IN) Inhale 2.5 mg into the lungs every 6 (six) hours as needed. Reported on 11/07/2015    Historical Provider, MD  amiodarone (PACERONE) 200 MG tablet Take 1 tablet (200 mg total) by mouth daily. 01/10/15   Jolaine Artist, MD  amoxicillin (AMOXIL) 500 MG tablet Take 2 gms thirty minutes - one hour prior to procedure 10/20/15   Jolaine Artist, MD  docusate sodium (COLACE) 100 MG capsule Take 200 mg by mouth 2 (two) times daily.    Historical Provider, MD  hydrocortisone (ANUSOL-HC) 2.5 % rectal cream Place 1 application rectally 2 (two) times daily as needed for hemorrhoids or itching.    Historical Provider, MD  Multiple Vitamins-Minerals (MULTIVITAMIN PO) Take 1 tablet by mouth at bedtime.     Historical Provider, MD  nitroGLYCERIN (NITROSTAT) 0.4 MG SL tablet Place 0.4 mg under the tongue every 5 (five) minutes as needed for chest pain. Reported on 11/07/2015    Historical Provider, MD  warfarin (COUMADIN) 5 MG tablet Take 2.5 mg on Mon and Fri. All other days take 5 mg . On  6/21 take 2.5 mg. . Patient taking differently: Take as directed 03/21/15   Conrad Greencastle, NP    Scheduled Meds:  Infusions:  PRN Meds:    Allergies as of 11/07/2015  . (No Known Allergies)    Family History  Problem Relation Age of Onset  . Heart attack Mother   . Heart attack Father     Social History   Social History  . Marital Status: Married    Spouse Name: N/A  . Number of Children: N/A  . Years of  Education: N/A   Occupational History  . Not on file.   Social History Main Topics  . Smoking status: Former Smoker -- 2.00 packs/day for 45 years    Types: Cigarettes    Quit date: 11/11/2001  . Smokeless tobacco: Never  Used  . Alcohol Use: No     Comment: 05/24/2013 "use to drink beer; hardly nothing since 2003; once in awhile a beer "  . Drug Use: No  . Sexual Activity: No   Other Topics Concern  . Not on file   Social History Narrative    REVIEW OF SYSTEMS: Constitutional:  Weight stable.   ENT:  No nose bleeds Pulm:  Per HPI.  No productive cough. Several days ago was using Mucinex for chest congestion.  CV:  No palpitations, no LE edema. Per HPI.  No chest pain.  GU:  No hematuria, no frequency GI:  Per HPI Heme:  Per HPI   Transfusions:  Per HPI Neuro:  No headaches, no peripheral tingling or numbness Derm:  No itching, no rash or sores.  Endocrine:  No sweats or chills.  No polyuria or dysuria Immunization:  2013 pneumovax .  No flu shot in epic records.  Travel:  None beyond local counties in last few months.    PHYSICAL EXAM: Vital signs in last 24 hours: There were no vitals filed for this visit. Wt Readings from Last 3 Encounters:  11/07/15 104.962 kg (231 lb 6.4 oz)  09/05/15 103.42 kg (228 lb)  08/10/15 108.228 kg (238 lb 9.6 oz)    General: pleasant, chronically ill but comfortable, jovial elderly WM.   Head:  No swelling, no asymmetry,  No signs head trauma  Eyes:  No icterus.  No conj pallor Ears:  Not HOH  Nose:  No congestion or discharge Mouth:  Clear moist oral MM.  Neck:  No JVD, no mass, no TMG Lungs:  Clear bil.  No cough or dyspnea. Heart: LVAD hum, faint underlying rhythm is audible, tele monitor with intermittent irregular beats.  Abdomen:  Soft, drive line on right C/D/I.  Active BS.   Rectal: deferred.   Musc/Skeltl: no joint swelling or redness Extremities:  No CCE  Neurologic:  Oriented x 3.  Moves all 4 limbs, strength not  tested.  No tremor.  Skin:  No sores or rashes. No telangectasia Tattoos:  Several on arms Nodes:  No cervical adenopathy   Psych:  Pleasant, in good spirits, calm.   Intake/Output from previous day:   Intake/Output this shift:    LAB RESULTS:  Recent Labs  11/07/15 1044  WBC 7.4  HGB 8.8*  HCT 30.2*  PLT 179   BMET Lab Results  Component Value Date   NA 140 11/07/2015   NA 142 09/05/2015   NA 138 08/22/2015   K 4.2 11/07/2015   K 4.0 09/05/2015   K 4.2 08/22/2015   CL 105 11/07/2015   CL 109 09/05/2015   CL 106 08/22/2015   CO2 25 11/07/2015   CO2 25 09/05/2015   CO2 25 08/22/2015   GLUCOSE 116* 11/07/2015   GLUCOSE 136* 09/05/2015   GLUCOSE 129* 08/22/2015   BUN 18 11/07/2015   BUN 15 09/05/2015   BUN 16 08/22/2015   CREATININE 1.12 11/07/2015   CREATININE 0.89 09/05/2015   CREATININE 1.07 08/22/2015   CALCIUM 9.7 11/07/2015   CALCIUM 9.4 09/05/2015   CALCIUM 9.9 08/22/2015   LFT No results for input(s): PROT, ALBUMIN, AST, ALT, ALKPHOS, BILITOT, BILIDIR, IBILI in the last 72 hours. PT/INR Lab Results  Component Value Date   INR 2.13* 11/07/2015   INR 2.43* 10/25/2015   INR 2.87* 10/11/2015   Hepatitis Panel No results for input(s): HEPBSAG, HCVAB, HEPAIGM, HEPBIGM in the last 72 hours. C-Diff No  components found for: CDIFF Lipase  No results found for: LIPASE  Drugs of Abuse  No results found for: LABOPIA, COCAINSCRNUR, LABBENZ, AMPHETMU, THCU, LABBARB   RADIOLOGY STUDIES: No results found.  ENDOSCOPIC STUDIES: Per HPI  IMPRESSION:   *  Recurrent anemia, presumed due to GI blood loss from known AVMs but epistaxis about 1 week ago may also be contributing.  Ferritin lower than 1 month ago despite regular iron infusions at [facility].  Nose bleed last Wednesday 2/1.  Black stools last week but also taking Bismuth.  *  S/p LVAD, chronic Coumadin. On hold now. INR therapeutic.   *  Recurrent A fib.  Prior DCCV in 08/2014  *   Cirrhosis by CT of 02/2014. No portal hypertension or varices on previous enteroscopies.   *  COPD, OSA.  On nocturnal oxygen and CPAP    PLAN:     *  Enteroscopy.  Unable to schedule with [facility] until soonest Thursday 2/9 (endo staff working on this) .  Can eat heart diet til then.   *  Check FOB x 1.   *  Note orders for PRBC x 2 transfusion.    Azucena Freed  11/07/2015, 3:00 PM Pager: [phone]      Attending physician's note   I have taken a history, examined the patient and reviewed the chart. I agree with the Advanced Practitioner's note, impression and recommendations. 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 He has received 5 iron infusions in the past 1 year. Reported having 2 episodes of large epistaxis and was coughing up clots of blood that collected in the back of his throat from the epistaxis. His wife had to pack his nose last weekend. He noticed black stool for past 1 week but thinks today its back to brown.  Anemia likely multifactorial. Will monitor response to transfusion, if responds appropriately and no further overt GI bleed, will hold off endoscopic evaluation for now.  May have to consider increasing the frequency of iron transfusions. ?Barton Fanny, MD [phone] Mon-Fri 8a-5p [phone] after 5p, weekends, holidays

## 2015-11-07 NOTE — H&P (Signed)
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 Quit smoking 2003. Uses CPAP and O2 every night. He is s/p LVAD HM II implanted 01/12/13 under DT criteria.   Over the last few years he has had 4 admits for GI bleed. Most recent was  December 2015 and required enteroscopy that showed bleed from jejunum requiring 3 clips.  Today he returned to LVAD clinic for routine follow up. Complaining of increased dyspnea and fatigue. Over the last week he reports melena. On Sunday he had nose bleed that his wife controlled with a tampon and afrin. Taking all medications. Denies hematuria. Weights at home stable. Labs checked in the clinic and showed hemoglobin 8.8 which is down from 12.1.   LVAD INTERROGATION:  HeartMate II LVAD:  Flow 5.4  liters/min, speed 9200, power 5.7 , PI 4.9 with 0-5 PI events. .     Review of Systems: [y] = yes, [ ]  = no   General: Weight gain [ ] ; Weight loss [ ] ; Anorexia [ ] ; Fatigue [Y ]; Fever [ ] ; Chills [ ] ; Weakness [Y ]  Cardiac: Chest pain/pressure [ ] ; Resting SOB [ ] ; Exertional SOB [Y ]; Orthopnea [ ] ; Pedal Edema [ ] ; Palpitations [ ] ; Syncope [ ] ; Presyncope [ ] ; Paroxysmal nocturnal dyspnea[ ]   Pulmonary: Cough [ ] ; Wheezing[ ] ; Hemoptysis[ ] ; Sputum [ ] ; Snoring [ ]   GI: Vomiting[ ] ; Dysphagia[ ] ; Melena[ ] ; Hematochezia [ ] ; Heartburn[ ] ; Abdominal pain [ ] ; Constipation [ ] ; Diarrhea [ ] ; BRBPR [ ]   GU: Hematuria[ ] ; Dysuria [ ] ; Nocturia[ ]   Vascular: Pain in legs with walking [ ] ; Pain in feet with lying flat [ ] ; Non-healing sores [ ] ; Stroke [ ] ; TIA [ ] ; Slurred speech [ ] ;  Neuro: Headaches[ ] ; Vertigo[ ] ; Seizures[ ] ; Paresthesias[ ] ;Blurred vision [ ] ; Diplopia [ ] ; Vision changes [ ]   Ortho/Skin: Arthritis [ ] ; Joint pain [Y ]; Muscle  pain [ ] ; Joint swelling [ ] ; Back Pain [ ] ; Rash [ ]   Psych: Depression[ ] ; Anxiety[ ]   Heme: Bleeding problems [ ] ; Clotting disorders [ ] ; Anemia Blue.Reese ]  Endocrine: Diabetes [ ] ; Thyroid dysfunction[ ]   Home Medications Prior to Admission medications   Medication Sig Start Date End Date Taking? Authorizing Provider  Albuterol (PROVENTIL IN) Inhale 2.5 mg into the lungs every 6 (six) hours as needed. Reported on 11/07/2015    Historical Provider, MD  amiodarone (PACERONE) 200 MG tablet Take 1 tablet (200 mg total) by mouth daily. 01/10/15   Jolaine Artist, MD  amoxicillin (AMOXIL) 500 MG tablet Take 2 gms thirty minutes - one hour prior to procedure 10/20/15   Jolaine Artist, MD  budesonide-formoterol [facility]) 160-4.5 MCG/ACT inhaler Inhale 2 puffs into the lungs 2 (two) times daily. 01/10/15   Jolaine Artist, MD  cholecalciferol (VITAMIN D) 1000 UNITS tablet Take 1 tablet (1,000 Units total) by mouth 2 (two) times daily. 01/10/15   Jolaine Artist, MD  citalopram (CELEXA) 40 MG tablet Take 1 tablet (40 mg total) by mouth at bedtime. 01/10/15   Jolaine Artist, MD  docusate sodium (COLACE) 100 MG capsule Take 200 mg by mouth 2 (two) times daily.  Historical Provider, MD  furosemide (LASIX) 40 MG tablet Take 40 mg by mouth as needed for fluid.    Historical Provider, MD  hydrocortisone (ANUSOL-HC) 2.5 % rectal cream Place 1 application rectally 2 (two) times daily as needed for hemorrhoids or itching.    Historical Provider, MD  levothyroxine (SYNTHROID, LEVOTHROID) 125 MCG tablet Take 125 mcg by mouth daily before breakfast.    Historical Provider, MD  losartan (COZAAR) 50 MG tablet Take 1.5 tablets (75 mg total) by mouth at bedtime. 01/10/15   Jolaine Artist, MD  Multiple Vitamins-Minerals (MULTIVITAMIN PO) Take 1 tablet by mouth at bedtime.     Historical Provider, MD  nitroGLYCERIN (NITROSTAT) 0.4 MG SL tablet Place 0.4 mg under the tongue every 5 (five) minutes as needed  for chest pain. Reported on 11/07/2015    Historical Provider, MD  pantoprazole (PROTONIX) 40 MG tablet Take 1 tablet (40 mg total) by mouth 2 (two) times daily. 01/10/15   Jolaine Artist, MD  potassium chloride (K-DUR) 10 MEQ tablet Take 10 mEq by mouth as needed (when taking furosemide).    Historical Provider, MD  simvastatin (ZOCOR) 80 MG tablet Take 0.5 tablets (40 mg total) by mouth at bedtime. 01/10/15   Jolaine Artist, MD  tiotropium (SPIRIVA) 18 MCG inhalation capsule Place 1 capsule (18 mcg total) into inhaler and inhale daily. 01/10/15   Jolaine Artist, MD  vitamin B-12 (CYANOCOBALAMIN) 1000 MCG tablet Take 1,000 mcg by mouth at bedtime.    Historical Provider, MD  vitamin C (ASCORBIC ACID) 500 MG tablet Take 1 tablet (500 mg total) by mouth 2 (two) times daily. 01/10/15   Jolaine Artist, MD  warfarin (COUMADIN) 5 MG tablet Take 2.5 mg on Mon and Fri. All other days take 5 mg . On  6/21 take 2.5 mg. . Patient taking differently: Take as directed 03/21/15   Conrad [place]on[place], NP    Past Medical History: Past Medical History  Diagnosis Date  . Ischemic cardiomyopathy      CABG 2003, PCI 2007  EF 27%(myoview 2012  . Chronic systolic heart failure ([place])   . Hyperlipidemia   . Hypothyroidism   . Chronic anticoagulation     Afib and LVAD  . Obesity   . COPD (chronic obstructive pulmonary disease) ([place])   . Asbestosis(501)     "6 years in the [place]" (05/24/2013)  . Atrial fibrillation ([place])     permanent  . Paroxysmal ventricular tachycardia ([place])   . Coronary artery disease   . Implantable cardioverter-defibrillator [employer]   . Hypertension   . Myocardial infarction ([place]) 1990's-2000    "2 in  ~ the 1990's; 1 in ~ 2000" (05/24/2013)  . OSA on CPAP   . Depression   . LVAD (left ventricular assist device) present ([place]) 12/2012    Past Surgical History: Past Surgical History  Procedure Laterality Date  . Coronary artery bypass graft  2003    "?X4" (05/24/2013)  . Cardiac  defibrillator placement  2004; ~ 2010  . Insertion of implantable left ventricular assist device N/A 01/12/2013    Procedure: INSERTION OF IMPLANTABLE LEFT VENTRICULAR ASSIST DEVICE;  Surgeon: Ivin Poot, MD;  Location: [place];  Service: Open Heart Surgery;  Laterality: N/A;   nitric oxide; Redo sternotomy  . Intraoperative transesophageal echocardiogram N/A 01/12/2013    Procedure: INTRAOPERATIVE TRANSESOPHAGEAL ECHOCARDIOGRAM;  Surgeon: Ivin Poot, MD;  Location: [place];  Service: Open Heart Surgery;  Laterality: N/A;  .  Colonoscopy N/A 03/09/2014    Procedure: COLONOSCOPY;  Surgeon: Inda Castle, MD;  Location: [place];  Service: Endoscopy;  Laterality: N/A;  LVAD  patient  . Esophagogastroduodenoscopy N/A 03/09/2014    Procedure: ESOPHAGOGASTRODUODENOSCOPY (EGD);  Surgeon: Inda Castle, MD;  Location: [place];  Service: Endoscopy;  Laterality: N/A;  . Givens capsule study N/A 03/10/2014    Procedure: GIVENS CAPSULE STUDY;  Surgeon: Inda Castle, MD;  Location: [place];  Service: Endoscopy;  Laterality: N/A;  . Enteroscopy N/A 07/27/2014    Procedure: ENTEROSCOPY;  Surgeon: Gatha Mayer, MD;  Location: [place];  Service: Endoscopy;  Laterality: N/A;  LVAD patient   . Colonoscopy N/A 07/29/2014    Procedure: COLONOSCOPY;  Surgeon: Gatha Mayer, MD;  Location: [place];  Service: Endoscopy;  Laterality: N/A;  . Givens capsule study N/A 07/30/2014    Procedure: GIVENS CAPSULE STUDY;  Surgeon: Gatha Mayer, MD;  Location: [place];  Service: Endoscopy;  Laterality: N/A;  . Esophagogastroduodenoscopy N/A 08/01/2014    Procedure: ESOPHAGOGASTRODUODENOSCOPY (EGD);  Surgeon: Jerene Bears, MD;  Location[facility] ENDOSCOPY;  Service: Endoscopy;  Laterality: N/A;  LVAD patient  . Left and right heart catheterization with coronary/graft angiogram  01/07/2013    Procedure: LEFT AND RIGHT HEART CATHETERIZATION WITH Beatrix Fetters;  Surgeon: Jolaine Artist, MD;  Location: [facility] CATH LAB;  Service: Cardiovascular;;  . Enteroscopy N/A 09/27/2014    Procedure: ENTEROSCOPY;  Surgeon: Gatha Mayer, MD;  Location: [facility] ENDOSCOPY;  Service: Endoscopy;  Laterality: N/A;  . Icd lead removal Left 03/16/2015    Procedure: ICD LEAD REMOVAL/EXTRACTION;  Surgeon: Evans Lance, MD;  Location: [place];  Service: Cardiovascular;  Laterality: Left;  PATIENT HAS LVAD  DR. VAN TRIGT TO BACK UP EXTRACTION    Family History: Family History  Problem Relation Age of Onset  . Heart attack Mother   . Heart attack Father     Social History: Social History   Social History  . Marital Status: Married    Spouse Name: N/A  . Number of Children: N/A  . Years of Education: N/A   Social History Main Topics  . Smoking status: Former Smoker -- 2.00 packs/day for 45 years    Types: Cigarettes    Quit date: 11/11/2001  . Smokeless tobacco: Never Used  . Alcohol Use: No     Comment: 05/24/2013 "use to drink beer; hardly nothing since 2003; once in awhile a beer "  . Drug Use: No  . Sexual Activity: No   Other Topics Concern  . Not on file   Social History Narrative    Allergies:  No Known Allergies  Objective:    Vital Signs:       There were no vitals filed for this visit.  Mean arterial Pressure 84    Physical Exam: General:  Pale. Fatigued appearing. No resp difficulty HEENT: normal Neck: supple. JVP 6-7 . Carotids 2+ bilat; no bruits. No lymphadenopathy or thryomegaly appreciated. Cor: Mechanical heart sounds with LVAD hum present. Lungs: clear Abdomen: obese, soft, nontender, nondistended. No hepatosplenomegaly. No bruits or masses. Good bowel sounds. Driveline: C/D/I; securement device intact and driveline incorporated Extremities: no cyanosis, clubbing, rash, edema Neuro: alert & orientedx3, cranial nerves grossly intact. moves all 4 extremities w/o difficulty. Affect pleasant  Telemetry- A fib 70s   Labs: Basic Metabolic  Panel:  Recent Labs Lab 11/07/15 1044  NA 140  K 4.2  CL 105  CO2 25  GLUCOSE 116*  BUN 18  CREATININE 1.12  CALCIUM 9.7    Liver Function Tests: No results for input(s): AST, ALT, ALKPHOS, BILITOT, PROT, ALBUMIN in the last 168 hours. No results for input(s): LIPASE, AMYLASE in the last 168 hours. No results for input(s): AMMONIA in the last 168 hours.  CBC:  Recent Labs Lab 11/07/15 1044  WBC 7.4  NEUTROABS 5.8  HGB 8.8*  HCT 30.2*  MCV 104.1*  PLT 179    Cardiac Enzymes: No results for input(s): CKTOTAL, CKMB, CKMBINDEX, TROPONINI in the last 168 hours.  BNP: BNP (last 3 results)  Recent Labs  07/04/15 1147 08/22/15 1300 11/07/15 1043  BNP 267.1* 203.2* 398.1*    ProBNP (last 3 results) No results for input(s): PROBNP in the last 8760 hours.   CBG: No results for input(s): GLUCAP in the last 168 hours.  Coagulation Studies:  Recent Labs  11/07/15 1044  LABPROT 23.7*  INR 2.13*    Other results: EKG: A fib 76 bpm Imaging:  No results found.      Assessment:  1. Symptomatic Anemia/melena  H/O GI bleed most recent 2015 Had bleeding noted in jejunum requiring 3 clips. 2. PAF - In A Fib today.  3. Chronic Systolic HF: S/P LVAD for DT 12/2012  4. Chronic Anticoagulation 5. OSA 6. H/O Epistaxis- Most recent 2/5 responded to Afrin.  Plan/Discussion:    Today we are admitting Mr Fallah with symptomatic anemia. Todays hemoglobin is down from 12> 8.8. Hold coumadin for now. GI consulted. He is on PPI and stool softner. Type and Screen. Give 2UPRBCs. Check daily CBCs.  He is back in A fib. Rate is controlled.  He is on 200 mg amio daily. Can increase to 200 mg twice a day if he is tachy. Will not cardiovert for now due to symptomatic anemia. Holding coumadin for now.  From HF stand point he is stable. No diuretics for now. He is not on BB due to previous bradycardia. Map ok today. Hold off on losartan for now.   LVAD parameters are stable.  Occasional PI events. Continue weekly dressing changes. No aspirin with history of GI bleed. Can add heparin once INR drifts down to 1.8.   I reviewed the LVAD parameters from today, and compared the results to the patient's prior recorded data.  No programming changes were made.  The LVAD is functioning within specified parameters.  The patient performs LVAD self-test daily.  LVAD interrogation was negative for any significant power changes, alarms or PI events/speed drops.  LVAD equipment check completed and is in good working order.  Back-up equipment present.   LVAD education done on emergency procedures and precautions and reviewed exit site care.  Length of Stay:   [name] NP-C  11/07/2015, 12:54 PM  VAD Team Pager [phone] (7am - 7am) +++VAD ISSUES ONLY+++ Advanced Heart Failure Team Pager [phone] (M-F; [place])  Please contact [facility] for night-coverage after hours (4p -7a ) and weekends on [email or web address] for all non- LVAD Issues  Patient seen and examined with [name], NP. We discussed all aspects of the encounter. I agree with the assessment and plan as stated above.   He has recurrent melena with symptomatic anemia. Will admit for transfusion and repeat EGD with push enteroscopy. Check iron stores. On device interrogation has been back in AF x 3 weeks but will not cardiovert given need to interrupt anticoagulation.   VAD parameters and ICD interrogated personally.   Mattie Nordell,MD  4:02 PM

## 2015-11-08 DIAGNOSIS — K625 Hemorrhage of anus and rectum: Secondary | ICD-10-CM | POA: Insufficient documentation

## 2015-11-08 DIAGNOSIS — K649 Unspecified hemorrhoids: Secondary | ICD-10-CM | POA: Insufficient documentation

## 2015-11-08 DIAGNOSIS — D649 Anemia, unspecified: Secondary | ICD-10-CM

## 2015-11-08 DIAGNOSIS — K5520 Angiodysplasia of colon without hemorrhage: Secondary | ICD-10-CM | POA: Insufficient documentation

## 2015-11-08 LAB — CBC WITH DIFFERENTIAL/PLATELET
BASOS ABS: 0 10*3/uL (ref 0.0–0.1)
BASOS PCT: 0 %
EOS ABS: 0.2 10*3/uL (ref 0.0–0.7)
EOS PCT: 3 %
HCT: 31.5 % — ABNORMAL LOW (ref 39.0–52.0)
HEMOGLOBIN: 9.3 g/dL — AB (ref 13.0–17.0)
LYMPHS ABS: 0.5 10*3/uL — AB (ref 0.7–4.0)
Lymphocytes Relative: 8 %
MCH: 29 pg (ref 26.0–34.0)
MCHC: 29.5 g/dL — ABNORMAL LOW (ref 30.0–36.0)
MCV: 98.1 fL (ref 78.0–100.0)
Monocytes Absolute: 0.5 10*3/uL (ref 0.1–1.0)
Monocytes Relative: 8 %
NEUTROS PCT: 81 %
Neutro Abs: 5.6 10*3/uL (ref 1.7–7.7)
PLATELETS: 166 10*3/uL (ref 150–400)
RBC: 3.21 MIL/uL — AB (ref 4.22–5.81)
RDW: 20.3 % — ABNORMAL HIGH (ref 11.5–15.5)
WBC: 6.9 10*3/uL (ref 4.0–10.5)

## 2015-11-08 LAB — TYPE AND SCREEN
ABO/RH(D): O POS
Antibody Screen: NEGATIVE
Unit division: 0
Unit division: 0

## 2015-11-08 LAB — LACTATE DEHYDROGENASE: LDH: 224 U/L — AB (ref 98–192)

## 2015-11-08 LAB — PROTIME-INR
INR: 1.71 — AB (ref 0.00–1.49)
PROTHROMBIN TIME: 20.1 s — AB (ref 11.6–15.2)

## 2015-11-08 LAB — HEMOGLOBIN AND HEMATOCRIT, BLOOD
HEMATOCRIT: 32.4 % — AB (ref 39.0–52.0)
HEMOGLOBIN: 9.4 g/dL — AB (ref 13.0–17.0)

## 2015-11-08 LAB — HEPARIN LEVEL (UNFRACTIONATED): Heparin Unfractionated: 0.13 IU/mL — ABNORMAL LOW (ref 0.30–0.70)

## 2015-11-08 MED ORDER — VITAMIN D 1000 UNITS PO TABS
1000.0000 [IU] | ORAL_TABLET | Freq: Two times a day (BID) | ORAL | Status: DC
  Administered 2015-11-08 – 2015-11-14 (×12): 1000 [IU] via ORAL
  Filled 2015-11-08 (×12): qty 1

## 2015-11-08 MED ORDER — ATORVASTATIN CALCIUM 40 MG PO TABS
40.0000 mg | ORAL_TABLET | Freq: Every day | ORAL | Status: DC
  Administered 2015-11-08 – 2015-11-13 (×6): 40 mg via ORAL
  Filled 2015-11-08 (×6): qty 1

## 2015-11-08 MED ORDER — HEPARIN (PORCINE) IN NACL 100-0.45 UNIT/ML-% IJ SOLN
1700.0000 [IU]/h | INTRAMUSCULAR | Status: AC
  Administered 2015-11-08: 1500 [IU]/h via INTRAVENOUS
  Administered 2015-11-09: 1700 [IU]/h via INTRAVENOUS
  Filled 2015-11-08 (×2): qty 250

## 2015-11-08 MED ORDER — BUDESONIDE-FORMOTEROL FUMARATE 160-4.5 MCG/ACT IN AERO
2.0000 | INHALATION_SPRAY | Freq: Two times a day (BID) | RESPIRATORY_TRACT | Status: DC
  Administered 2015-11-08 – 2015-11-14 (×12): 2 via RESPIRATORY_TRACT
  Filled 2015-11-08: qty 6

## 2015-11-08 NOTE — Progress Notes (Signed)
HeartMate 2 Rounding Note  Subjective:    Feels ok. Got 2u PRBCs. No BM yet. No further obvious bleeding. GI has seen. Denies dyspnea or ab pain.   LVAD INTERROGATION:  HeartMate II LVAD: Flow 4.9 liters/min, speed 9200, power 5.0 , PI 5.6 with rare PI events. .    Objective:    Vital Signs:   Temp:  [97.5 F (36.4 C)-98.8 F (37.1 C)] 98.2 F (36.8 C) (02/08 0344) Pulse Rate:  [61-107] 67 (02/08 0344) Resp:  [16-18] 18 (02/08 0344) BP: (82-107)/(0-75) 82/70 mmHg (02/08 0344) SpO2:  [94 %-99 %] 94 % (02/08 0344) Weight:  [101.4 kg (223 lb 8.7 oz)-104.962 kg (231 lb 6.4 oz)] 101.4 kg (223 lb 8.7 oz) (02/08 0355) Last BM Date: 11/07/15 Mean arterial Pressure 80-90  Physical Exam: General:Lying in bed  No resp difficulty HEENT: normal Neck: supple. JVP 6-7 . Carotids 2+ bilat; no bruits. No lymphadenopathy or thryomegaly appreciated. Cor: Mechanical heart sounds with LVAD hum present. Lungs: clear Abdomen: obese, soft, nontender, nondistended. No hepatosplenomegaly. No bruits or masses. Good bowel sounds. Driveline: C/D/I; securement device intact and driveline incorporated Extremities: no cyanosis, clubbing, rash, edema Neuro: alert & orientedx3, cranial nerves grossly intact. moves all 4 extremities w/o difficulty. Affect pleasant  Telemetry- A fib 60-70s (personally viewed)   Intake/Output:   Intake/Output Summary (Last 24 hours) at 11/08/15 0814 Last data filed at 11/08/15 0400  Gross per 24 hour  Intake   1070 ml  Output    480 ml  Net    590 ml       Labs: Basic Metabolic Panel:  Recent Labs Lab 11/07/15 1044  NA 140  K 4.2  CL 105  CO2 25  GLUCOSE 116*  BUN 18  CREATININE 1.12  CALCIUM 9.7    Liver Function Tests: No results for input(s): AST, ALT, ALKPHOS, BILITOT, PROT, ALBUMIN in the last 168 hours. No results for input(s): LIPASE, AMYLASE in the last 168 hours. No results for input(s): AMMONIA in the last 168  hours.  CBC:  Recent Labs Lab 11/07/15 1044 11/08/15 0545  WBC 7.4 6.9  NEUTROABS 5.8 5.6  HGB 8.8* 9.3*  HCT 30.2* 31.5*  MCV 104.1* 98.1  PLT 179 166    INR:  Recent Labs Lab 11/07/15 1044 11/08/15 0545  INR 2.13* 1.71*    Other results:    Imaging:  No results found.   Medications:     Scheduled Medications: . sodium chloride   Intravenous Once  . amiodarone  200 mg Oral Daily  . citalopram  40 mg Oral Daily  . docusate sodium  100 mg Oral BID  . levothyroxine  125 mcg Oral QAC breakfast  . pantoprazole  40 mg Oral BID  . simvastatin  40 mg Oral q1800  . tiotropium  18 mcg Inhalation Daily     Infusions: . sodium chloride       PRN Medications:     Assessment:   1. Symptomatic Anemia/melena H/O GI bleed most recent 2015 Had bleeding noted in jejunum requiring 3 clips. 2. PAF - In A Fib x 3-4 weeks 3. Chronic Systolic HF: S/P LVAD for DT 12/2012  4. Chronic Anticoagulation 5. OSA 6. H/O Epistaxis- Most recent 2/5 responded to Afrin.    Plan/Discussion:    Got 2 uPRBCs. HGb up to 9.3. Feels a bit better. No further bleeding. GI has seen. Anemia felt to be multifactorial. Will monitor for further bleeding prior to deciding on  repeat endoscopy. Holding coumadin for now. INR 1.7. Will start low-dose heparin. May need another unit of RBCs. Will repeat H/H later today. Appreciate GI input.  Remains in AF for past 3-4 weeks. Unable to cardiovert due to need for stopping anti-coag. Rate controlled on amio.   HF stable. VAD parameters ok.   I reviewed the LVAD parameters from today, and compared the results to the patient's prior recorded data.  No programming changes were made.  The LVAD is functioning within specified parameters.  The patient performs LVAD self-test daily.  LVAD interrogation was negative for any significant power changes, alarms or PI events/speed drops.  LVAD equipment check completed and is in good working  order.  Back-up equipment present.   LVAD education done on emergency procedures and precautions and reviewed exit site care.  Length of Stay: 1  Kirtan Sada MD 11/08/2015, 8:14 AM  VAD Team --- VAD ISSUES ONLY--- Pager [phone] (7am - 7am)  Advanced Heart Failure Team  Pager [phone] (M-F; 7a - 4p)  Please contact [facility] for night-coverage after hours (4p -7a ) and weekends on [email or web address]

## 2015-11-08 NOTE — Progress Notes (Signed)
Daily Rounding Note  11/08/2015, 11:05 AM  LOS: 1 day   SUBJECTIVE:       Feels well.  Last Bm yesterday, was brown.   OBJECTIVE:         Vital signs in last 24 hours:    Temp:  [97.5 F (36.4 C)-98.8 F (37.1 C)] 98.2 F (36.8 C) (02/08 0344) Pulse Rate:  [61-107] 67 (02/08 0344) Resp:  [16-18] 18 (02/08 0344) BP: (82-112)/(0-84) 112/84 mmHg (02/08 0835) SpO2:  [94 %-99 %] 94 % (02/08 0344) Weight:  [101.4 kg (223 lb 8.7 oz)-104.962 kg (231 lb 6.4 oz)] 101.4 kg (223 lb 8.7 oz) (02/08 0355) Last BM Date: 11/07/15 Filed Weights   11/07/15 1500 11/08/15 0355  Weight: 101.7 kg (224 lb 3.3 oz) 101.4 kg (223 lb 8.7 oz)   General: pleasant, not acutely ill looking   Heart: VAD hum with underlying RRR barely audible.  NSR on Monitor Chest: clear bil.  No dyspnea or cough Abdomen: soft, protuberant, benign appearing drive line site.  NT.  Active BS  Extremities: no CCE Neuro/Psych:  Fully alert and oriented x 3.  No gross deficits.  Calm and relaxed.   Intake/Output from previous day: 02/07 0701 - 02/08 0700 In: 1070 [P.O.:240; I.V.:500; Blood:330] Out: 480 [Urine:480]  Intake/Output this shift: Total I/O In: 240 [P.O.:240] Out: -   Lab Results:  Recent Labs  11/07/15 1044 11/08/15 0545  WBC 7.4 6.9  HGB 8.8* 9.3*  HCT 30.2* 31.5*  PLT 179 166   BMET  Recent Labs  11/07/15 1044  NA 140  K 4.2  CL 105  CO2 25  GLUCOSE 116*  BUN 18  CREATININE 1.12  CALCIUM 9.7   LFT No results for input(s): PROT, ALBUMIN, AST, ALT, ALKPHOS, BILITOT, BILIDIR, IBILI in the last 72 hours. PT/INR  Recent Labs  11/07/15 1044 11/08/15 0545  LABPROT 23.7* 20.1*  INR 2.13* 1.71*   Hepatitis Panel No results for input(s): HEPBSAG, HCVAB, HEPAIGM, HEPBIGM in the last 72 hours.  Studies/Results: No results found.   Scheduled Meds: . sodium chloride   Intravenous Once  . amiodarone  200 mg Oral Daily  .  citalopram  40 mg Oral Daily  . docusate sodium  100 mg Oral BID  . levothyroxine  125 mcg Oral QAC breakfast  . pantoprazole  40 mg Oral BID  . simvastatin  40 mg Oral q1800  . tiotropium  18 mcg Inhalation Daily   Continuous Infusions: . sodium chloride    . heparin 1,500 Units/hr (11/08/15 0952)   PRN Meds:.   ASSESMENT:   * Recurrent anemia, presumed due to GI blood loss from known AVMs but epistaxis about 1 week ago may also be contributing.  Ferritin lower than 1 month ago despite regular iron infusions at [facility].  Nose bleed last Wednesday 2/1. Black stools last week but also taking Bismuth. S/p PRBC x 2 on 11/07/15 with 0. 5 gram bump in Hgb (sub optimal response)   * S/p LVAD, chronic Coumadin. On hold now. INR sub therapeutic. Heparin in place.   * Recurrent A fib. Prior DCCV in 08/2014.  Currently unable to attempt CV due to stopping anti-coag.   * Cirrhosis by CT of 02/2014. No portal hypertension or varices on previous enteroscopies.   * COPD, OSA. On nocturnal oxygen and CPAP     PLAN    *  Hgb repeating this afternoon.   *  ?  Does he need BID PPI, would once daily be sufficient?      Azucena Freed  11/08/2015, 11:05 AM Pager: [phone]   Attending physician's note   I have taken an interval history, reviewed the chart and examined the patient. I agree with the Advanced Practitioner's note, impression and recommendations.  No BM this morning yet. Await repeat Hgb. I discussed in detail with patient about repeat endoscopic evaluation or not given he has few reasons for anemia in addition to small bowel AVM's. He also mentioned having intermittent bright red blood per rectum from hemorrhoids with bowel movement.  We can consider starting long acting octreotide IM injection (Sandostatin 20mg  monthly) to reduce the frequency of small bowel AVM, if no contraindication from cardiac standpoint, will discuss with Dr Haroldine Laws. We can also arrange for  hemorrhoidal banding as outpatient to prevent recurrent hemorrhoidal bleeding.   Damaris Hippo, MD [phone] Mon-Fri 8a-5p [phone] after 5p, weekends, holidays

## 2015-11-08 NOTE — Progress Notes (Signed)
ANTICOAGULATION CONSULT NOTE   Pharmacy Consult for heparin Indication: LVAD  No Known Allergies  Patient Measurements: Height: 6\' 1"  (185.4 cm) Weight: 223 lb 8.7 oz (101.4 kg) IBW/kg (Calculated) : 79.9 Heparin Dosing Weight: 100kg  Vital Signs: Temp: 98.3 F (36.8 C) (02/08 1945) Temp Source: Oral (02/08 1945) BP: 112/84 mmHg (02/08 0835) Pulse Rate: 60 (02/08 1945)  Labs:  Recent Labs  11/07/15 1044 11/08/15 0545 11/08/15 1215 11/08/15 1850  HGB 8.8* 9.3* 9.4*  --   HCT 30.2* 31.5* 32.4*  --   PLT 179 166  --   --   LABPROT 23.7* 20.1*  --   --   INR 2.13* 1.71*  --   --   HEPARINUNFRC  --   --   --  0.13*  CREATININE 1.12  --   --   --     Estimated Creatinine Clearance: 72.4 mL/min (by C-G formula based on Cr of 1.12).  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 Quit smoking 2003. Uses CPAP and O2 every night. He is s/p LVAD HM II implanted 01/12/13 under DT criteria.   Patient presented to HF clinic for check up, c/o sob/fatigue.  INR low at 1.71, so heparin bridge started (warfarin currently on hold). First heparin level low at 0.13.   No overt bleeding/hematuria noted. Hgb 9.3, plts 166.  Goal of Therapy:  Heparin level 0.3-0.5 units/ml Monitor platelets by anticoagulation protocol: Yes   Plan:  -Increase heparin to 1700 units/hr  -Check HL with AM labs -Heparin off tomorrow morning (2/9) at 0900 for GI procedure  Ming Kunka D. Rayya Yagi, PharmD, BCPS Clinical Pharmacist Pager: [phone] 11/08/2015 8:31 PM

## 2015-11-08 NOTE — Progress Notes (Signed)
ANTICOAGULATION CONSULT NOTE   Pharmacy Consult for heparin Indication: LVAD  No Known Allergies  Patient Measurements: Height: 6\' 1"  (185.4 cm) Weight: 223 lb 8.7 oz (101.4 kg) IBW/kg (Calculated) : 79.9 Heparin Dosing Weight: 100kg  Vital Signs: Temp: 98.2 F (36.8 C) (02/08 0344) Temp Source: Oral (02/08 0344) BP: 112/84 mmHg (02/08 0835) Pulse Rate: 67 (02/08 0344)  Labs:  Recent Labs  11/07/15 1044 11/08/15 0545  HGB 8.8* 9.3*  HCT 30.2* 31.5*  PLT 179 166  LABPROT 23.7* 20.1*  INR 2.13* 1.71*  CREATININE 1.12  --     Estimated Creatinine Clearance: 72.4 mL/min (by C-G formula based on Cr of 1.12).   Medical History: Past Medical History  Diagnosis Date  . Ischemic cardiomyopathy      CABG 2003, PCI 2007  EF 27%(myoview 2012  . Chronic systolic heart failure ([place])   . Hyperlipidemia   . Hypothyroidism   . Chronic anticoagulation     Afib and LVAD  . Obesity   . COPD (chronic obstructive pulmonary disease) ([place])   . Asbestosis(501)     "6 years in the [place]" (05/24/2013)  . Atrial fibrillation ([place])     permanent  . Paroxysmal ventricular tachycardia ([place])   . Coronary artery disease   . Hypertension   . Depression   . LVAD (left ventricular assist device) present ([place]) 12/2012  . Epistaxis 11/2015, 07/2014  . Cancer ([place])     "scraped some off behind my left ear; fast moving; having it cut out 12/05/2015" (11/07/2015)  . Complication of anesthesia     "they can't put me all the way to sleep cause of my heart" (11/07/2015)  . CHF (congestive heart failure) ([place])   . Myocardial infarction ([place]) 1990's-2000    "2 in  ~ the 1990's; 1 in ~ 2000" (11/07/2015)  . On home oxygen therapy     "have it available; don't use it" (11/07/2015)  . OSA on CPAP   . Anemia   . 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 Quit smoking 2003. Uses CPAP and O2 every night. He is s/p LVAD HM II implanted 01/12/13 under DT criteria.   Patient presented to HF clinic for check up, c/o sob/fatigue. Hgb drawn was 8.8 down from 12. INR was 2.1 yesterday and today is 1.7. No overt bleeding/hematuria noted, warfarin currently on hold. With INR down this am will start low dose heparin bridge.  Goal of Therapy:  Heparin level 0.3-0.5 units/ml Monitor platelets by anticoagulation protocol: Yes   Plan:  Start IV heparin at 1500 units/hr  Check HL tonight at 1800 then daily Heparin off tomorrow morning for GI procedure  Erin Hearing PharmD., BCPS Clinical Pharmacist Pager [phone] 11/08/2015 8:51 AM

## 2015-11-09 ENCOUNTER — Encounter (HOSPITAL_COMMUNITY)
Admission: AD | Disposition: A | Discharge status: Home / Self Care | Payer: Self-pay | Source: Ambulatory Visit | Attending: Internal Medicine

## 2015-11-09 ENCOUNTER — Encounter (HOSPITAL_COMMUNITY): Payer: Self-pay | Admitting: Gastroenterology

## 2015-11-09 ENCOUNTER — Other Ambulatory Visit: Payer: Self-pay | Admitting: Hematology

## 2015-11-09 ENCOUNTER — Inpatient Hospital Stay (HOSPITAL_COMMUNITY): Payer: No Typology Code available for payment source | Admitting: Certified Registered"

## 2015-11-09 HISTORY — PX: ENTEROSCOPY: SHX5533

## 2015-11-09 LAB — BASIC METABOLIC PANEL
ANION GAP: 8 (ref 5–15)
BUN: 15 mg/dL (ref 6–20)
CHLORIDE: 105 mmol/L (ref 101–111)
CO2: 24 mmol/L (ref 22–32)
CREATININE: 0.93 mg/dL (ref 0.61–1.24)
Calcium: 9.3 mg/dL (ref 8.9–10.3)
GFR calc non Af Amer: >60 mL/min (ref >60)
Glucose, Bld: 101 mg/dL — ABNORMAL HIGH (ref 65–99)
POTASSIUM: 4.1 mmol/L (ref 3.5–5.1)
SODIUM: 137 mmol/L (ref 135–145)

## 2015-11-09 LAB — CBC WITH DIFFERENTIAL/PLATELET
BASOS PCT: 1 %
Basophils Absolute: 0 10*3/uL (ref 0.0–0.1)
EOS ABS: 0.2 10*3/uL (ref 0.0–0.7)
Eosinophils Relative: 4 %
HEMATOCRIT: 31.3 % — AB (ref 39.0–52.0)
HEMOGLOBIN: 9.6 g/dL — AB (ref 13.0–17.0)
LYMPHS ABS: 0.6 10*3/uL — AB (ref 0.7–4.0)
Lymphocytes Relative: 9 %
MCH: 30.3 pg (ref 26.0–34.0)
MCHC: 30.7 g/dL (ref 30.0–36.0)
MCV: 98.7 fL (ref 78.0–100.0)
MONOS PCT: 9 %
Monocytes Absolute: 0.6 10*3/uL (ref 0.1–1.0)
NEUTROS ABS: 5.1 10*3/uL (ref 1.7–7.7)
NEUTROS PCT: 77 %
Platelets: 161 10*3/uL (ref 150–400)
RBC: 3.17 MIL/uL — AB (ref 4.22–5.81)
RDW: 19.6 % — ABNORMAL HIGH (ref 11.5–15.5)
WBC: 6.5 10*3/uL (ref 4.0–10.5)

## 2015-11-09 LAB — OCCULT BLOOD X 1 CARD TO LAB, STOOL: FECAL OCCULT BLD: POSITIVE — AB

## 2015-11-09 LAB — PROTIME-INR
INR: 1.45 (ref 0.00–1.49)
Prothrombin Time: 17.7 seconds — ABNORMAL HIGH (ref 11.6–15.2)

## 2015-11-09 LAB — HEPARIN LEVEL (UNFRACTIONATED): Heparin Unfractionated: 0.36 IU/mL (ref 0.30–0.70)

## 2015-11-09 LAB — LACTATE DEHYDROGENASE: LDH: 245 U/L — AB (ref 98–192)

## 2015-11-09 SURGERY — ENTEROSCOPY
Anesthesia: Monitor Anesthesia Care

## 2015-11-09 MED ORDER — WARFARIN SODIUM 5 MG PO TABS
5.0000 mg | ORAL_TABLET | Freq: Once | ORAL | Status: AC
  Administered 2015-11-09: 5 mg via ORAL
  Filled 2015-11-09: qty 1

## 2015-11-09 MED ORDER — WARFARIN - PHARMACIST DOSING INPATIENT: Freq: Every day | Status: DC

## 2015-11-09 MED ORDER — BUTAMBEN-TETRACAINE-BENZOCAINE 2-2-14 % EX AERO
INHALATION_SPRAY | CUTANEOUS | Status: DC | PRN
  Administered 2015-11-09: 2 via TOPICAL

## 2015-11-09 MED ORDER — HEPARIN (PORCINE) IN NACL 100-0.45 UNIT/ML-% IJ SOLN
1900.0000 [IU]/h | INTRAMUSCULAR | Status: AC
  Administered 2015-11-09 – 2015-11-10 (×2): 1700 [IU]/h via INTRAVENOUS
  Filled 2015-11-09 (×2): qty 250

## 2015-11-09 MED ORDER — PHENYLEPHRINE HCL 10 MG/ML IJ SOLN
INTRAMUSCULAR | Status: DC | PRN
  Administered 2015-11-09: 120 ug via INTRAVENOUS

## 2015-11-09 MED ORDER — PROPOFOL 10 MG/ML IV BOLUS
INTRAVENOUS | Status: DC | PRN
  Administered 2015-11-09: 30 mg via INTRAVENOUS
  Administered 2015-11-09: 20 mg via INTRAVENOUS

## 2015-11-09 MED ORDER — PROPOFOL 500 MG/50ML IV EMUL
INTRAVENOUS | Status: DC | PRN
  Administered 2015-11-09: 75 ug/kg/min via INTRAVENOUS

## 2015-11-09 NOTE — Progress Notes (Addendum)
VAD Coordinator Procedure Note:   Patient underwent push enteroscopy for anemia and possible GIB. Hemodynamics and VAD parameters monitored by me throughout the procedure. MAPs were obtained with automatic BP cuff on L FA. Doppler BP matched with measured systolic BP.    Pre-procedure: Speed: 9200 Flow: 5.1 PI: 6.0 Power: 5.6w  Sedation Induction:  Speed: 9200 Flow: 4.8  PI: 4.6 Power: 5.4w  Interventions:  None for hemodynamic support  Recovery area: Speed: 4.8 Flow: 9200 PI: 6.4 Power: 5.3w  Patient tolerated the procedure well. PIs were 4.5 - 6.0 throughout the case with no power elevations. After adequate sedation was achieved, pulse ox >92% and MAPs maintained >70.   Patient Disposition: Care of his nurse on 2w.   Plan to restart heparin and coumadin tonight and observe hemoglobin for re-bleeding. If re-bleeds consider capsule study. D/W Dr. Haroldine Laws and will hold off on octreotide for now.   Arrange for outpatient GI management for hemorrhoid management and possible repair.   Janene Madeira, BSN, RN, CCRN  [facility] Coordinator   Office: [phone] 24/7 [place] Pager: [phone]

## 2015-11-09 NOTE — Progress Notes (Signed)
[facility] [facility] for heparin / warfarin Indication: LVAD  No Known Allergies  Patient Measurements: Height: 6\' 1"  (185.4 cm) Weight: 221 lb 12.5 oz (100.6 kg) IBW/kg (Calculated) : 79.9 Heparin Dosing Weight: 100kg  Vital Signs: Temp: 98.2 F (36.8 C) (02/09 1406) Temp Source: Oral (02/09 1406) BP: 97/73 mmHg (02/09 1406) Pulse Rate: 56 (02/09 1415)  Labs:  Recent Labs  11/07/15 1044 11/08/15 0545 11/08/15 1215 11/08/15 1850 11/09/15 0611 11/09/15 0830  HGB 8.8* 9.3* 9.4*  --   --  9.6*  HCT 30.2* 31.5* 32.4*  --   --  31.3*  PLT 179 166  --   --   --  161  LABPROT 23.7* 20.1*  --   --  17.7*  --   INR 2.13* 1.71*  --   --  1.45  --   HEPARINUNFRC  --   --   --  0.13* 0.36  --   CREATININE 1.12  --   --   --   --  0.93    Estimated Creatinine Clearance: 86.9 mL/min (by C-G formula based on Cr of 0.93).   Medical History: Past Medical History  Diagnosis Date  . Ischemic cardiomyopathy      CABG 2003, PCI 2007  EF 27%(myoview 2012  . Chronic systolic heart failure ([place])   . Hyperlipidemia   . Hypothyroidism   . Chronic anticoagulation     Afib and LVAD  . Obesity   . COPD (chronic obstructive pulmonary disease) ([place])   . Asbestosis(501)     "6 years in the [place]" (05/24/2013)  . Atrial fibrillation ([place])     permanent  . Paroxysmal ventricular tachycardia ([place])   . Coronary artery disease   . Hypertension   . Depression   . LVAD (left ventricular assist device) present ([place]) 12/2012  . Epistaxis 11/2015, 07/2014  . Cancer ([place])     "scraped some off behind my left ear; fast moving; having it cut out 12/05/2015" (11/07/2015)  . CHF (congestive heart failure) ([place])   . Myocardial infarction ([place]) 1990's-2000    "2 in  ~ the 1990's; 1 in ~ 2000" (11/07/2015)  . On home oxygen therapy     "have it available; don't use it" (11/07/2015)  . OSA on CPAP   . Anemia   . History of blood transfusion 08/2015 X 10; 11/2015 X 2    "related  to bleeding on the inside somewhere" (11/07/2015)  . 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 Quit smoking 2003. Uses CPAP and O2 every night. He is s/p LVAD HM II implanted 01/12/13 under DT criteria.   Patient presented to HF clinic for check up, c/o sob/fatigue. Hgb drawn was 8.8 down from 12. No overt bleeding/hematuria noted, patient did notice black stools PTA but also takes pepto-bismal.  He did have a very big nose bleed this week and suffers from frequent hemorrhoidal bleeding. S/p endoscopy today with no abnormalities. Warfarin was on hold for procedure - restart tonight. Admit INR 2.1> 1.4 Home dose warfarin 2.5mg  MWF/5mg  TTSS  Heparin bridge started pre-op while INR < 1.6 will restart this evening.    Goal of Therapy:  INR 1.8-2.5 Heparin level 0.3-0.5 units/ml Monitor platelets by anticoagulation protocol: Yes  Plan:  Restart IV heparin at 1700 units/hr  Check HL tonight 6hr after restart  Warfarin 5mg  x1 tonight Daily HL, Protime, CBC   Bonnita Nasuti Pharm.D. CPP, BCPS Clinical Pharmacist [phone] 11/09/2015 3:55 PM

## 2015-11-09 NOTE — Transfer of Care (Signed)
Immediate Anesthesia Transfer of Care Note  Patient: [name]  Procedure(s) Performed: Procedure(s) with comments: ENTEROSCOPY (N/A) - LVAD  Patient Location: Endoscopy Unit  Anesthesia Type:MAC  Level of Consciousness: awake, oriented and patient cooperative  Airway & Oxygen Therapy: Patient Spontanous Breathing and Patient connected to nasal cannula oxygen  Post-op Assessment: Report given to RN, Post -op Vital signs reviewed and stable and Patient moving all extremities  Post vital signs: Reviewed and stable  Last Vitals:  Filed Vitals:   11/09/15 0802 11/09/15 1244  BP: 109/86 117/82  Pulse: 61 58  Temp: 36.9 C   Resp: 18 21    Complications: No apparent anesthesia complications

## 2015-11-09 NOTE — Anesthesia Postprocedure Evaluation (Signed)
Anesthesia Post Note  Patient: [name]  Procedure(s) Performed: Procedure(s) (LRB): ENTEROSCOPY (N/A)  Patient location during evaluation: Endoscopy Anesthesia Type: MAC Level of consciousness: awake and alert Pain management: pain level controlled Vital Signs Assessment: post-procedure vital signs reviewed and stable Respiratory status: spontaneous breathing, nonlabored ventilation and respiratory function stable Cardiovascular status: stable and blood pressure returned to baseline Anesthetic complications: no    Last Vitals:  Filed Vitals:   11/09/15 1406 11/09/15 1415  BP: 97/73   Pulse:  56  Temp: 36.8 C   Resp: 21 16    Last Pain:  Filed Vitals:   11/09/15 1415  PainSc: 0-No pain                 Montez Hageman

## 2015-11-09 NOTE — Anesthesia Preprocedure Evaluation (Addendum)
Anesthesia Evaluation  Patient identified by MRN, date of birth, ID band Patient awake    Reviewed: Allergy & Precautions, NPO status , Patient's Chart, lab work & pertinent test results  Airway Mallampati: II  TM Distance: >3 FB Neck ROM: Full    Dental no notable dental hx.    Pulmonary sleep apnea and Continuous Positive Airway Pressure Ventilation , COPD, former smoker,    Pulmonary exam normal breath sounds clear to auscultation       Cardiovascular hypertension, + CAD, + Past MI, + CABG (2003) and +CHF  Normal cardiovascular exam+ dysrhythmias Atrial Fibrillation  Rhythm:Regular Rate:Normal  LVAD   Neuro/Psych TIAnegative psych ROS   GI/Hepatic negative GI ROS, Neg liver ROS,   Endo/Other  negative endocrine ROS  Renal/GU negative Renal ROS  negative genitourinary   Musculoskeletal negative musculoskeletal ROS (+)   Abdominal   Peds negative pediatric ROS (+)  Hematology  (+) anemia ,   Anesthesia Other Findings   Reproductive/Obstetrics negative OB ROS                           Anesthesia Physical Anesthesia Plan  ASA: IV  Anesthesia Plan: MAC   Post-op Pain Management:    Induction: Intravenous  Airway Management Planned: Natural Airway, Nasal Cannula and Simple Face Mask  Additional Equipment:   Intra-op Plan:   Post-operative Plan:   Informed Consent: I have reviewed the patients History and Physical, chart, labs and discussed the procedure including the risks, benefits and alternatives for the proposed anesthesia with the patient or authorized representative who has indicated his/her understanding and acceptance.   Dental advisory given  Plan Discussed with: CRNA, Anesthesiologist and Surgeon  Anesthesia Plan Comments:        Anesthesia Quick Evaluation

## 2015-11-09 NOTE — Interval H&P Note (Signed)
History and Physical Interval Note:  11/09/2015 11:41 AM  [name]  has presented today for surgery, with the diagnosis of anemia, hx SB AVMs, LVAD pt.  The various methods of treatment have been discussed with the patient and family. After consideration of risks, benefits and other options for treatment, the patient has consented to  Procedure(s) with comments: ENTEROSCOPY (N/A) - LVAD as a surgical intervention .  The patient's history has been reviewed, patient examined, no change in status, stable for surgery.  I have reviewed the patient's chart and labs.  Questions were answered to the patient's satisfaction.     Alianny Toelle

## 2015-11-09 NOTE — Progress Notes (Signed)
ANTICOAGULATION CONSULT NOTE - Follow Up Consult  Pharmacy Consult for heparin Indication: LVAD  Labs:  Recent Labs  11/07/15 1044 11/08/15 0545 11/08/15 1215 11/08/15 1850 11/09/15 0611  HGB 8.8* 9.3* 9.4*  --   --   HCT 30.2* 31.5* 32.4*  --   --   PLT 179 166  --   --   --   LABPROT 23.7* 20.1*  --   --   --   INR 2.13* 1.71*  --   --   --   HEPARINUNFRC  --   --   --  0.13* 0.36  CREATININE 1.12  --   --   --   --      Assessment/Plan:  75yo male therapeutic on heparin after rate increase. Will continue gtt at current rate until off for GI procedure and f/u to resume.   Wynona Neat, PharmD, BCPS  11/09/2015,6:43 AM

## 2015-11-09 NOTE — Op Note (Signed)
[facility] [place], [id number]   ENTEROSCOPY PROCEDURE REPORT     EXAM DATE: 11/09/2015  PATIENT NAME:      [name]           MR #:      [id number]  BIRTHDATE:       [birth year]      VISIT #:     [phone]  ATTENDING:     Harl Bowie, MD     STATUS:     outpatient ASSISTANT:      Elspeth Cho and Monday, Crawford Givens MD: Jolaine Artist, M.D. ASA CLASS:        Class IV  INDICATIONS:  The patient is a 75 yr old male here for an enteroscopy procedure due to hemoccult positive stools and anemia.  PROCEDURE PERFORMED:     Diagnostic small bowel enteroscopy  MEDICATIONS:     Monitored anesthesia care  CONSENT: The patient understands the risks and benefits of the procedure and understands that these risks include, but are not limited to: sedation, allergic reaction, infection, perforation and/or bleeding. Alternative means of evaluation and treatment include, among others: physical exam, x-rays, and/or surgical intervention. The patient elects to proceed with this endoscopic procedure.  DESCRIPTION OF PROCEDURE: During intra-op preparation period all mechanical & medical equipment was checked for proper function. Hand hygiene and appropriate measures for infection prevention was taken. After the risks, benefits and alternatives of the procedure were thoroughly explained, Informed consent was verified, confirmed and timeout was successfully executed by the treatment team. The Pentax [id number] (s/n [id number]) endoscope was introduced through the mouth and advanced to the proximal jejunum jejunum. The prep was The overall prep quality was good.. The instrument was then slowly withdrawn while examining the mucosa circumferentially. The scope was then completely withdrawn from the patient and the procedure terminated. The pulse, BP, and O2 saturation were monitored and documented by the physician and the nursing  staff throughout the entire procedure.  The patient was cared for as planned according to standard protocol, then discharged to recovery in stable condition and with appropriate post procedure care. Estimated blood loss is zero unless otherwise noted in this procedure report.   Esophagus: Area of salmon pink mucosa with irrgular Z-line, no nodules. Biopsies not obtained STOMACH: The mucosa of the stomach appeared normal. DUODENUM: The duodenal mucosa showed no abnormalities in the entire duodenum. JEJUNUM: The exam showed no abnormalities in the visualized proximal  50-70cm jejunum.    ADVERSE EVENTS:      There were no immediate complications.  IMPRESSIONS:     1.  The mucosa of the stomach appeared normal 2.  The duodenal mucosa showed no abnormalities in the entire duodenum 3.  The exam showed no abnormalities in the jejunum  RECOMMENDATIONS:     Can restart Coumadin If has recurrent episode will consider small bowel video capsule Recurrent hemorrhoidal bleed, can arrange for hemorrhoidal banding as outpatient (Done in office without sedation) Will sign off, please call with any questions    _____________________________ Harl Bowie, MD eSigned:  Harl Bowie, MD 11/09/2015 2:27 PM        PATIENT NAME:  [name], [name] MR#: [id number]

## 2015-11-09 NOTE — H&P (View-Only) (Signed)
Daily Rounding Note  11/08/2015, 11:05 AM  LOS: 1 day   SUBJECTIVE:       Feels well.  Last Bm yesterday, was brown.   OBJECTIVE:         Vital signs in last 24 hours:    Temp:  [97.5 F (36.4 C)-98.8 F (37.1 C)] 98.2 F (36.8 C) (02/08 0344) Pulse Rate:  [61-107] 67 (02/08 0344) Resp:  [16-18] 18 (02/08 0344) BP: (82-112)/(0-84) 112/84 mmHg (02/08 0835) SpO2:  [94 %-99 %] 94 % (02/08 0344) Weight:  [101.4 kg (223 lb 8.7 oz)-104.962 kg (231 lb 6.4 oz)] 101.4 kg (223 lb 8.7 oz) (02/08 0355) Last BM Date: 11/07/15 Filed Weights   11/07/15 1500 11/08/15 0355  Weight: 101.7 kg (224 lb 3.3 oz) 101.4 kg (223 lb 8.7 oz)   General: pleasant, not acutely ill looking   Heart: VAD hum with underlying RRR barely audible.  NSR on Monitor Chest: clear bil.  No dyspnea or cough Abdomen: soft, protuberant, benign appearing drive line site.  NT.  Active BS  Extremities: no CCE Neuro/Psych:  Fully alert and oriented x 3.  No gross deficits.  Calm and relaxed.   Intake/Output from previous day: 02/07 0701 - 02/08 0700 In: 1070 [P.O.:240; I.V.:500; Blood:330] Out: 480 [Urine:480]  Intake/Output this shift: Total I/O In: 240 [P.O.:240] Out: -   Lab Results:  Recent Labs  11/07/15 1044 11/08/15 0545  WBC 7.4 6.9  HGB 8.8* 9.3*  HCT 30.2* 31.5*  PLT 179 166   BMET  Recent Labs  11/07/15 1044  NA 140  K 4.2  CL 105  CO2 25  GLUCOSE 116*  BUN 18  CREATININE 1.12  CALCIUM 9.7   LFT No results for input(s): PROT, ALBUMIN, AST, ALT, ALKPHOS, BILITOT, BILIDIR, IBILI in the last 72 hours. PT/INR  Recent Labs  11/07/15 1044 11/08/15 0545  LABPROT 23.7* 20.1*  INR 2.13* 1.71*   Hepatitis Panel No results for input(s): HEPBSAG, HCVAB, HEPAIGM, HEPBIGM in the last 72 hours.  Studies/Results: No results found.   Scheduled Meds: . sodium chloride   Intravenous Once  . amiodarone  200 mg Oral Daily  .  citalopram  40 mg Oral Daily  . docusate sodium  100 mg Oral BID  . levothyroxine  125 mcg Oral QAC breakfast  . pantoprazole  40 mg Oral BID  . simvastatin  40 mg Oral q1800  . tiotropium  18 mcg Inhalation Daily   Continuous Infusions: . sodium chloride    . heparin 1,500 Units/hr (11/08/15 0952)   PRN Meds:.   ASSESMENT:   * Recurrent anemia, presumed due to GI blood loss from known AVMs but epistaxis about 1 week ago may also be contributing.  Ferritin lower than 1 month ago despite regular iron infusions at [facility].  Nose bleed last Wednesday 2/1. Black stools last week but also taking Bismuth. S/p PRBC x 2 on 11/07/15 with 0. 5 gram bump in Hgb (sub optimal response)   * S/p LVAD, chronic Coumadin. On hold now. INR sub therapeutic. Heparin in place.   * Recurrent A fib. Prior DCCV in 08/2014.  Currently unable to attempt CV due to stopping anti-coag.   * Cirrhosis by CT of 02/2014. No portal hypertension or varices on previous enteroscopies.   * COPD, OSA. On nocturnal oxygen and CPAP     PLAN    *  Hgb repeating this afternoon.   *  ?  Does he need BID PPI, would once daily be sufficient?      Azucena Freed  11/08/2015, 11:05 AM Pager: [phone]   Attending physician's note   I have taken an interval history, reviewed the chart and examined the patient. I agree with the Advanced Practitioner's note, impression and recommendations.  No BM this morning yet. Await repeat Hgb. I discussed in detail with patient about repeat endoscopic evaluation or not given he has few reasons for anemia in addition to small bowel AVM's. He also mentioned having intermittent bright red blood per rectum from hemorrhoids with bowel movement.  We can consider starting long acting octreotide IM injection (Sandostatin 20mg  monthly) to reduce the frequency of small bowel AVM, if no contraindication from cardiac standpoint, will discuss with Dr Haroldine Laws. We can also arrange for  hemorrhoidal banding as outpatient to prevent recurrent hemorrhoidal bleeding.   Damaris Hippo, MD [phone] Mon-Fri 8a-5p [phone] after 5p, weekends, holidays

## 2015-11-09 NOTE — Progress Notes (Signed)
HeartMate 2 Rounding Note  Subjective:    Feels fine. Denies dyspnea or ab pain. Hgb stable. For enteroscopy today.   On heparin.    LVAD INTERROGATION:  HeartMate II LVAD: Flow 4.8 liters/min, speed 9200, power 5.0 , PI 6.1 with rare PI events. .    Objective:    Vital Signs:   Temp:  [98.3 F (36.8 C)-98.5 F (36.9 C)] 98.5 F (36.9 C) (02/09 0802) Pulse Rate:  [58-61] 61 (02/09 0802) Resp:  [16-18] 18 (02/09 0802) BP: (93-109)/(78-86) 109/86 mmHg (02/09 0802) SpO2:  [96 %-98 %] 98 % (02/09 0802) Weight:  [100.6 kg (221 lb 12.5 oz)] 100.6 kg (221 lb 12.5 oz) (02/09 0450) Last BM Date: 11/09/15 Mean arterial Pressure 90s  Physical Exam: General:Lying in bed  No resp difficulty HEENT: normal Neck: supple. JVP 6-7 . Carotids 2+ bilat; no bruits. No lymphadenopathy or thryomegaly appreciated. Cor: Mechanical heart sounds with LVAD hum present. Lungs: clear Abdomen: obese, soft, nontender, nondistended. No hepatosplenomegaly. No bruits or masses. Good bowel sounds. Driveline: C/D/I; securement device intact and driveline incorporated Extremities: no cyanosis, clubbing, rash, edema Neuro: alert & orientedx3, cranial nerves grossly intact. moves all 4 extremities w/o difficulty. Affect pleasant  Telemetry- A fib 60-70s (personally viewed)   Intake/Output:   Intake/Output Summary (Last 24 hours) at 11/09/15 0850 Last data filed at 11/09/15 0802  Gross per 24 hour  Intake    480 ml  Output    950 ml  Net   -470 ml       Labs: Basic Metabolic Panel:  Recent Labs Lab 11/07/15 1044  NA 140  K 4.2  CL 105  CO2 25  GLUCOSE 116*  BUN 18  CREATININE 1.12  CALCIUM 9.7    Liver Function Tests: No results for input(s): AST, ALT, ALKPHOS, BILITOT, PROT, ALBUMIN in the last 168 hours. No results for input(s): LIPASE, AMYLASE in the last 168 hours. No results for input(s): AMMONIA in the last 168 hours.  CBC:  Recent Labs Lab 11/07/15 1044  11/08/15 0545 11/08/15 1215  WBC 7.4 6.9  --   NEUTROABS 5.8 5.6  --   HGB 8.8* 9.3* 9.4*  HCT 30.2* 31.5* 32.4*  MCV 104.1* 98.1  --   PLT 179 166  --     INR:  Recent Labs Lab 11/07/15 1044 11/08/15 0545  INR 2.13* 1.71*    Other results:    Imaging: No results found.   Medications:     Scheduled Medications: . sodium chloride   Intravenous Once  . amiodarone  200 mg Oral Daily  . atorvastatin  40 mg Oral q1800  . budesonide-formoterol  2 puff Inhalation BID  . cholecalciferol  1,000 Units Oral BID  . citalopram  40 mg Oral Daily  . docusate sodium  100 mg Oral BID  . levothyroxine  125 mcg Oral QAC breakfast  . pantoprazole  40 mg Oral BID  . tiotropium  18 mcg Inhalation Daily    Infusions: . sodium chloride    . heparin 1,700 Units/hr (11/09/15 0802)    PRN Medications:     Assessment:   1. Symptomatic Anemia/melena H/O GI bleed most recent 2015 Had bleeding noted in jejunum requiring 3 clips. 2. PAF - In A Fib x 3-4 weeks 3. Chronic Systolic HF: S/P LVAD for DT 12/2012  4. Chronic Anticoagulation 5. OSA 6. H/O Epistaxis- Most recent 2/5 responded to Afrin.  7. Hemorrhoids  Plan/Discussion:    Got 2u  RBCs with only small bump in Hgb. No further melena. Hgb now stable. Tolerating heparin.  Case d/w Dr. Silverio Decamp - will proceed with enteroscopy today. Hemorrhoids and epistaxis may also be contributing.Will try to arrange for octreotide but he gets medicines from [place] so may not be able to afford.   Remains in AF for past 3-4 weeks. Unable to cardiovert due to need for stopping anti-coag. Rate controlled on amio.   MAPs elevated. Will address after endoscopy today.   HF stable. VAD parameters ok.   I reviewed the LVAD parameters from today, and compared the results to the patient's prior recorded data.  No programming changes were made.  The LVAD is functioning within specified parameters.  The patient performs LVAD self-test  daily.  LVAD interrogation was negative for any significant power changes, alarms or PI events/speed drops.  LVAD equipment check completed and is in good working order.  Back-up equipment present.   LVAD education done on emergency procedures and precautions and reviewed exit site care.  Length of Stay: 2  Glori Bickers MD 11/09/2015, 8:50 AM  VAD Team --- VAD ISSUES ONLY--- Pager [phone] (7am - 7am)  Advanced Heart Failure Team  Pager [phone] (M-F; 7a - 4p)  Please contact [facility] for night-coverage after hours (4p -7a ) and weekends on [email or web address]

## 2015-11-10 ENCOUNTER — Telehealth: Payer: Self-pay | Admitting: Hematology

## 2015-11-10 ENCOUNTER — Other Ambulatory Visit: Payer: Self-pay | Admitting: Hematology

## 2015-11-10 ENCOUNTER — Encounter (HOSPITAL_COMMUNITY): Payer: Self-pay | Admitting: Gastroenterology

## 2015-11-10 ENCOUNTER — Telehealth: Payer: Self-pay | Admitting: *Deleted

## 2015-11-10 LAB — CBC WITH DIFFERENTIAL/PLATELET
BASOS ABS: 0 10*3/uL (ref 0.0–0.1)
Basophils Relative: 0 %
Eosinophils Absolute: 0.2 10*3/uL (ref 0.0–0.7)
Eosinophils Relative: 3 %
HEMATOCRIT: 31.5 % — AB (ref 39.0–52.0)
HEMOGLOBIN: 9.2 g/dL — AB (ref 13.0–17.0)
LYMPHS PCT: 12 %
Lymphs Abs: 0.7 10*3/uL (ref 0.7–4.0)
MCH: 28.9 pg (ref 26.0–34.0)
MCHC: 29.2 g/dL — ABNORMAL LOW (ref 30.0–36.0)
MCV: 99.1 fL (ref 78.0–100.0)
MONO ABS: 0.6 10*3/uL (ref 0.1–1.0)
Monocytes Relative: 9 %
NEUTROS ABS: 4.9 10*3/uL (ref 1.7–7.7)
NEUTROS PCT: 76 %
Platelets: 162 10*3/uL (ref 150–400)
RBC: 3.18 MIL/uL — ABNORMAL LOW (ref 4.22–5.81)
RDW: 19.2 % — AB (ref 11.5–15.5)
WBC: 6.5 10*3/uL (ref 4.0–10.5)

## 2015-11-10 LAB — PROTIME-INR
INR: 1.31 (ref 0.00–1.49)
Prothrombin Time: 16.5 seconds — ABNORMAL HIGH (ref 11.6–15.2)

## 2015-11-10 LAB — BASIC METABOLIC PANEL
ANION GAP: 9 (ref 5–15)
BUN: 14 mg/dL (ref 6–20)
CALCIUM: 9.4 mg/dL (ref 8.9–10.3)
CO2: 26 mmol/L (ref 22–32)
CREATININE: 0.93 mg/dL (ref 0.61–1.24)
Chloride: 107 mmol/L (ref 101–111)
GFR calc Af Amer: >60 mL/min (ref >60)
GFR calc non Af Amer: >60 mL/min (ref >60)
GLUCOSE: 100 mg/dL — AB (ref 65–99)
Potassium: 4.1 mmol/L (ref 3.5–5.1)
Sodium: 142 mmol/L (ref 135–145)

## 2015-11-10 LAB — HEPARIN LEVEL (UNFRACTIONATED)
HEPARIN UNFRACTIONATED: 0.41 [IU]/mL (ref 0.30–0.70)
HEPARIN UNFRACTIONATED: 0.3 [IU]/mL (ref 0.30–0.70)
Heparin Unfractionated: 0.15 IU/mL — ABNORMAL LOW (ref 0.30–0.70)

## 2015-11-10 LAB — LACTATE DEHYDROGENASE: LDH: 208 U/L — ABNORMAL HIGH (ref 98–192)

## 2015-11-10 MED ORDER — SODIUM CHLORIDE 0.9 % IV SOLN
510.0000 mg | Freq: Once | INTRAVENOUS | Status: AC
  Administered 2015-11-10: 510 mg via INTRAVENOUS
  Filled 2015-11-10: qty 17

## 2015-11-10 MED ORDER — WARFARIN SODIUM 3 MG PO TABS
6.0000 mg | ORAL_TABLET | Freq: Once | ORAL | Status: AC
Start: 2015-11-10 — End: 2015-11-10
  Administered 2015-11-10: 6 mg via ORAL
  Filled 2015-11-10: qty 2

## 2015-11-10 MED ORDER — HEPARIN (PORCINE) IN NACL 100-0.45 UNIT/ML-% IJ SOLN: 1900.0000 [IU]/h | INTRAMUSCULAR | Status: DC

## 2015-11-10 NOTE — Telephone Encounter (Signed)
per pof to sch pt appt-sent MW email to sch fera-will call pt after reply °

## 2015-11-10 NOTE — Progress Notes (Signed)
ANTICOAGULATION CONSULT NOTE   Pharmacy Consult for heparin Indication: LVAD  No Known Allergies  Patient Measurements: Height: 6\' 1"  (185.4 cm) Weight: 223 lb 15.8 oz (101.6 kg) IBW/kg (Calculated) : 79.9 Heparin Dosing Weight: 100kg  Vital Signs: Temp: 98 F (36.7 C) (02/10 0654) Temp Source: Oral (02/10 0654) BP: 94/69 mmHg (02/10 1208) Pulse Rate: 59 (02/10 0654)  Labs:  Recent Labs  11/08/15 0545 11/08/15 1215  11/09/15 0611 11/09/15 0830 11/10/15 0045 11/10/15 0327 11/10/15 1225  HGB 9.3* 9.4*  --   --  9.6*  --  9.2*  --   HCT 31.5* 32.4*  --   --  31.3*  --  31.5*  --   PLT 166  --   --   --  161  --  162  --   LABPROT 20.1*  --   --  17.7*  --   --  16.5*  --   INR 1.71*  --   --  1.45  --   --  1.31  --   HEPARINUNFRC  --   --   < > 0.36  --  0.30 0.15* 0.41  CREATININE  --   --   --   --  0.93  --  0.93  --   < > = values in this interval not displayed.  Estimated Creatinine Clearance: 87.3 mL/min (by C-G formula based on Cr of 0.93).  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 Quit smoking 2003. Uses CPAP and O2 every night. He is s/p LVAD HM II implanted 01/12/13 under DT criteria.   Patient presented to HF clinic for check up, c/o sob/fatigue. Hgb drawn was 8.8 down from 12. No overt bleeding/hematuria noted, patient did notice black stools PTA but also takes pepto-bismal.  He did have a very big nose bleed this week and suffers from frequent hemorrhoidal bleeding. S/p endoscopy with no abnormalities.  Warfarin was restarted 2/9, INR still low at 1.3 tonight  Heparin bridge resumed post enteroscopy 2/9 while INR < 1.6. Heparin level 0.4 on 1900 units/hr. No bleeding noted.  Home dose warfarin 2.5mg  MWF/5mg  TTSS  Goal of Therapy:  INR 1.8-2.5 Heparin level 0.3-0.5 units/ml Monitor platelets by anticoagulation protocol: Yes    Plan:  Continue IV heparin at 1900 units/hr  Daily HL, INR, CBC Warfarin 6mg  tonight  Erin Hearing PharmD., BCPS Clinical Pharmacist Pager [phone] 11/10/2015 3:17 PM

## 2015-11-10 NOTE — Telephone Encounter (Signed)
Per staff message and POF I have scheduled appts. Advised scheduler of appts. JMW  

## 2015-11-10 NOTE — Progress Notes (Signed)
ANTICOAGULATION CONSULT NOTE   Pharmacy Consult for heparin Indication: LVAD  No Known Allergies  Patient Measurements: Height: 6\' 1"  (185.4 cm) Weight: 221 lb 12.5 oz (100.6 kg) IBW/kg (Calculated) : 79.9 Heparin Dosing Weight: 100kg  Vital Signs: Temp: 98.4 F (36.9 C) (02/09 2207) Temp Source: Oral (02/09 2207) BP: 97/73 mmHg (02/09 1406) Pulse Rate: 68 (02/09 2207)  Labs:  Recent Labs  11/07/15 1044 11/08/15 0545 11/08/15 1215 11/08/15 1850 11/09/15 0611 11/09/15 0830 11/10/15 0045  HGB 8.8* 9.3* 9.4*  --   --  9.6*  --   HCT 30.2* 31.5* 32.4*  --   --  31.3*  --   PLT 179 166  --   --   --  161  --   LABPROT 23.7* 20.1*  --   --  17.7*  --   --   INR 2.13* 1.71*  --   --  1.45  --   --   HEPARINUNFRC  --   --   --  0.13* 0.36  --  0.30  CREATININE 1.12  --   --   --   --  0.93  --     Estimated Creatinine Clearance: 86.9 mL/min (by C-G formula based on Cr of 0.93).  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 Quit smoking 2003. Uses CPAP and O2 every night. He is s/p LVAD HM II implanted 01/12/13 under DT criteria.   Patient presented to HF clinic for check up, c/o sob/fatigue. Hgb drawn was 8.8 down from 12. No overt bleeding/hematuria noted, patient did notice black stools PTA but also takes pepto-bismal.  He did have a very big nose bleed this week and suffers from frequent hemorrhoidal bleeding. S/p endoscopy today with no abnormalities. Warfarin was on hold for procedure but restarted 2/9. Admit INR 2.1> 1.45 Home dose warfarin 2.5mg  MWF/5mg  TTSS  Heparin bridge resumed post enteroscopy 2/9 while INR < 1.6. Heparin level 0.3 (low end of therapeutic) on 1700 units/hr. No bleeding noted.  Goal of Therapy:  INR 1.8-2.5 Heparin level 0.3-0.5 units/ml Monitor platelets by anticoagulation protocol: Yes   Plan:  Continue IV heparin at 1700  units/hr  Daily HL, INR, CBC  Sherlon Handing, PharmD, BCPS Clinical pharmacist, pager [phone] 11/10/2015 1:19 AM

## 2015-11-10 NOTE — Progress Notes (Addendum)
HeartMate 2 Rounding Note  Subjective:    Enteroscopy yesterday was normal. Feels fine. Denies dyspnea or ab pain. Hgb stable.   On heparin/coumadin. Back in NSR   LVAD INTERROGATION:  HeartMate II LVAD: Flow 4.5 liters/min, speed 9200, power 5.0 , PI 5.1 with rare PI events. .    Objective:    Vital Signs:   Temp:  [98 F (36.7 C)-98.5 F (36.9 C)] 98 F (36.7 C) (02/10 0654) Pulse Rate:  [56-68] 59 (02/10 0654) Resp:  [16-21] 16 (02/10 0654) BP: (97-117)/(73-86) 97/73 mmHg (02/09 1406) SpO2:  [93 %-100 %] 95 % (02/10 0732) Weight:  [101.6 kg (223 lb 15.8 oz)] 101.6 kg (223 lb 15.8 oz) (02/10 0654) Last BM Date: 11/09/15 Mean arterial Pressure 80s  Physical Exam: General:Lying in bed  No resp difficulty HEENT: normal Neck: supple. JVP 6-7 . Carotids 2+ bilat; no bruits. No lymphadenopathy or thryomegaly appreciated. Cor: Mechanical heart sounds with LVAD hum present. Lungs: clear Abdomen: obese, soft, nontender, nondistended. No hepatosplenomegaly. No bruits or masses. Good bowel sounds. Driveline: C/D/I; securement device intact and driveline incorporated Extremities: no cyanosis, clubbing, rash, edema Neuro: alert & orientedx3, cranial nerves grossly intact. moves all 4 extremities w/o difficulty. Affect pleasant  Telemetry- NSR with PACs (personally viewed)   Intake/Output:   Intake/Output Summary (Last 24 hours) at 11/10/15 0745 Last data filed at 11/10/15 0400  Gross per 24 hour  Intake    540 ml  Output    400 ml  Net    140 ml       Labs: Basic Metabolic Panel:  Recent Labs Lab 11/07/15 1044 11/09/15 0830 11/10/15 0327  NA 140 137 142  K 4.2 4.1 4.1  CL 105 105 107  CO2 25 24 26   GLUCOSE 116* 101* 100*  BUN 18 15 14   CREATININE 1.12 0.93 0.93  CALCIUM 9.7 9.3 9.4    Liver Function Tests: No results for input(s): AST, ALT, ALKPHOS, BILITOT, PROT, ALBUMIN in the last 168 hours. No results for input(s): LIPASE, AMYLASE in the  last 168 hours. No results for input(s): AMMONIA in the last 168 hours.  CBC:  Recent Labs Lab 11/07/15 1044 11/08/15 0545 11/08/15 1215 11/09/15 0830 11/10/15 0327  WBC 7.4 6.9  --  6.5 6.5  NEUTROABS 5.8 5.6  --  5.1 4.9  HGB 8.8* 9.3* 9.4* 9.6* 9.2*  HCT 30.2* 31.5* 32.4* 31.3* 31.5*  MCV 104.1* 98.1  --  98.7 99.1  PLT 179 166  --  161 162    INR:  Recent Labs Lab 11/07/15 1044 11/08/15 0545 11/09/15 0611 11/10/15 0327  INR 2.13* 1.71* 1.45 1.31    Other results:    Imaging: No results found.   Medications:     Scheduled Medications: . amiodarone  200 mg Oral Daily  . atorvastatin  40 mg Oral q1800  . budesonide-formoterol  2 puff Inhalation BID  . cholecalciferol  1,000 Units Oral BID  . citalopram  40 mg Oral Daily  . docusate sodium  100 mg Oral BID  . levothyroxine  125 mcg Oral QAC breakfast  . pantoprazole  40 mg Oral BID  . tiotropium  18 mcg Inhalation Daily  . Warfarin - Pharmacist Dosing Inpatient   Does not apply q1800    Infusions: . heparin 1,900 Units/hr (11/10/15 [id number])    PRN Medications:     Assessment:   1. Symptomatic Anemia/melena H/O GI bleed most recent 2015 Had bleeding noted in jejunum requiring  3 clips. 2. PAF - In A Fib x 3-4 weeks 3. Chronic Systolic HF: S/P LVAD for DT 12/2012  4. Chronic Anticoagulation 5. OSA 6. H/O Epistaxis- Most recent 2/5 responded to Afrin.  7. Hemorrhoids  Plan/Discussion:    Enteroscopy yesterday normal. No further melena. Hgb drifting down a little. Tolerating heparin. Continue heparin/coumadin.  Suspect anemia at least partially related to epistaxis and hemorrhoids. If hgb stable can go home when INR > 1.8. Plan for outpatient banding of hemorrhoids.   Was in AF for 3-4 weeks pre-admit. Now back in NSR. Continue amio.   MAPs ok  HF stable. VAD parameters ok.   I reviewed the LVAD parameters from today, and compared the results to the patient's prior recorded  data.  No programming changes were made.  The LVAD is functioning within specified parameters.  The patient performs LVAD self-test daily.  LVAD interrogation was negative for any significant power changes, alarms or PI events/speed drops.  LVAD equipment check completed and is in good working order.  Back-up equipment present.   LVAD education done on emergency procedures and precautions and reviewed exit site care.  Length of Stay: 3  Bensimhon, Daniel MD 11/10/2015, 7:45 AM  VAD Team --- VAD ISSUES ONLY--- Pager [phone] (7am - 7am)  Advanced Heart Failure Team  Pager [phone] (M-F; 7a - 4p)  Please contact [facility] for night-coverage after hours (4p -7a ) and weekends on [email or web address]

## 2015-11-10 NOTE — Progress Notes (Signed)
Spoke with Dr. Burr Wood, [name]'s hematologist that has been managing chronic iron deficiency anemia for several months now. Informed of the need for transfusion and had routed Ferritin/CBC/TIBC monthly lab results earlier this week for OP management.   Reviewed labs and advised to give 1 unit ferraheme and to schedule appointment 1w after discharge with her--will likely need second unit.   D/C Needs: 1. GI appointment to be set up with [place] team for eval of hemorrhoids and banding. 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   2. Appt with Dr. Burr Wood for management outpatient after acute bleeding event.   Ryan Madeira, RN VAD Coordinator   Office: [phone] 24/7 VAD Pager: [phone]

## 2015-11-10 NOTE — Care Management Important Message (Signed)
Important Message  Patient Details  Name: [name] MRN: [id number] Date of Birth: 06-08-1941   Medicare Important Message Given:  Yes    Erek Kowal Abena 11/10/2015, 11:10 AM

## 2015-11-10 NOTE — Progress Notes (Signed)
ANTICOAGULATION CONSULT NOTE - Follow Up Consult  Pharmacy Consult for heparin Indication: LVAD  Labs:  Recent Labs  11/07/15 1044 11/08/15 0545 11/08/15 1215  11/09/15 0611 11/09/15 0830 11/10/15 0045 11/10/15 0327  HGB 8.8* 9.3* 9.4*  --   --  9.6*  --  9.2*  HCT 30.2* 31.5* 32.4*  --   --  31.3*  --  31.5*  PLT 179 166  --   --   --  161  --  162  LABPROT 23.7* 20.1*  --   --  17.7*  --   --  16.5*  INR 2.13* 1.71*  --   --  1.45  --   --  1.31  HEPARINUNFRC  --   --   --   < > 0.36  --  0.30 0.15*  CREATININE 1.12  --   --   --   --  0.93  --  0.93  < > = values in this interval not displayed.   Assessment: 75yo male now subtherapeutic on heparin after one level at low end of goal after resuming.  Goal of Therapy:  Heparin level 0.3-0.5 units/ml   Plan:  Will increase heparin gtt by 2 units/kg/hr to 1900 units/hr and check level in 6hr.  Wynona Neat, PharmD, BCPS  11/10/2015,6:09 AM

## 2015-11-10 NOTE — Progress Notes (Signed)
Primary RN made aware PICC may not be inserted today (2/10).    She is notifying MD.

## 2015-11-11 LAB — BASIC METABOLIC PANEL
ANION GAP: 8 (ref 5–15)
BUN: 12 mg/dL (ref 6–20)
CHLORIDE: 108 mmol/L (ref 101–111)
CO2: 25 mmol/L (ref 22–32)
Calcium: 9.5 mg/dL (ref 8.9–10.3)
Creatinine, Ser: 0.84 mg/dL (ref 0.61–1.24)
GFR calc non Af Amer: >60 mL/min (ref >60)
GLUCOSE: 101 mg/dL — AB (ref 65–99)
POTASSIUM: 4 mmol/L (ref 3.5–5.1)
Sodium: 141 mmol/L (ref 135–145)

## 2015-11-11 LAB — CBC WITH DIFFERENTIAL/PLATELET
BASOS ABS: 0 10*3/uL (ref 0.0–0.1)
Basophils Relative: 0 %
Eosinophils Absolute: 0.2 10*3/uL (ref 0.0–0.7)
Eosinophils Relative: 4 %
HEMATOCRIT: 32.3 % — AB (ref 39.0–52.0)
Hemoglobin: 9.4 g/dL — ABNORMAL LOW (ref 13.0–17.0)
LYMPHS ABS: 0.6 10*3/uL — AB (ref 0.7–4.0)
LYMPHS PCT: 11 %
MCH: 28.6 pg (ref 26.0–34.0)
MCHC: 29.1 g/dL — ABNORMAL LOW (ref 30.0–36.0)
MCV: 98.2 fL (ref 78.0–100.0)
Monocytes Absolute: 0.6 10*3/uL (ref 0.1–1.0)
Monocytes Relative: 10 %
NEUTROS ABS: 4.3 10*3/uL (ref 1.7–7.7)
Neutrophils Relative %: 75 %
Platelets: 158 10*3/uL (ref 150–400)
RBC: 3.29 MIL/uL — AB (ref 4.22–5.81)
RDW: 18.9 % — ABNORMAL HIGH (ref 11.5–15.5)
WBC: 5.7 10*3/uL (ref 4.0–10.5)

## 2015-11-11 LAB — HEPARIN LEVEL (UNFRACTIONATED)
HEPARIN UNFRACTIONATED: 0.45 [IU]/mL (ref 0.30–0.70)
Heparin Unfractionated: 0.68 IU/mL (ref 0.30–0.70)
Heparin Unfractionated: 0.58 IU/mL (ref 0.30–0.70)

## 2015-11-11 LAB — PROTIME-INR
INR: 1.35 (ref 0.00–1.49)
Prothrombin Time: 16.8 seconds — ABNORMAL HIGH (ref 11.6–15.2)

## 2015-11-11 LAB — LACTATE DEHYDROGENASE: LDH: 212 U/L — ABNORMAL HIGH (ref 98–192)

## 2015-11-11 MED ORDER — HEPARIN (PORCINE) IN NACL 100-0.45 UNIT/ML-% IJ SOLN
1550.0000 [IU]/h | INTRAMUSCULAR | Status: DC
  Administered 2015-11-11: 1700 [IU]/h via INTRAVENOUS
  Administered 2015-11-12 – 2015-11-14 (×4): 1550 [IU]/h via INTRAVENOUS
  Filled 2015-11-11 (×5): qty 250

## 2015-11-11 MED ORDER — WARFARIN SODIUM 5 MG PO TABS
5.0000 mg | ORAL_TABLET | Freq: Once | ORAL | Status: AC
Start: 2015-11-11 — End: 2015-11-11
  Administered 2015-11-11: 5 mg via ORAL
  Filled 2015-11-11: qty 1

## 2015-11-11 MED ORDER — SODIUM CHLORIDE 0.9% FLUSH
10.0000 mL | INTRAVENOUS | Status: DC | PRN
  Administered 2015-11-12: 20 mL
  Administered 2015-11-14: 10 mL
  Filled 2015-11-11 (×2): qty 40

## 2015-11-11 MED ORDER — SODIUM CHLORIDE 0.9% FLUSH
10.0000 mL | Freq: Two times a day (BID) | INTRAVENOUS | Status: DC
  Administered 2015-11-12: 20 mL
  Administered 2015-11-13: 10 mL

## 2015-11-11 NOTE — Progress Notes (Signed)
Patient ID: [name], male   DOB: 08-29-1941, 75 y.o.   MRN: [id number]    HeartMate 2 Rounding Note  Subjective:    Enteroscopy 2/9 was normal. Feels fine. Denies dyspnea or abdominal pain. Hgb stable.   On heparin/coumadin. Remains in NSR  LVAD INTERROGATION:  HeartMate II LVAD: Flow 5 liters/min, speed 9200, power 5.4, PI 5.9.  1 PI event in 24 hrs.    Objective:    Vital Signs:   Temp:  [98 F (36.7 C)-98.1 F (36.7 C)] 98.1 F (36.7 C) (02/11 0358) Pulse Rate:  [62-84] 84 (02/11 0358) Resp:  [18] 18 (02/11 0358) SpO2:  [96 %-98 %] 96 % (02/11 0736) Weight:  [217 lb 8 oz (98.657 kg)] 217 lb 8 oz (98.657 kg) (02/11 0358) Last BM Date: 11/09/15 Mean arterial Pressure 80s  Physical Exam: General:NAD HEENT: normal Neck: supple. JVP 6-7 . Carotids 2+ bilat; no bruits. No lymphadenopathy or thryomegaly appreciated. Cor: Mechanical heart sounds with LVAD hum present. Lungs: clear Abdomen: obese, soft, nontender, nondistended. No hepatosplenomegaly. No bruits or masses. Good bowel sounds. Driveline: C/D/I; securement device intact and driveline incorporated Extremities: no cyanosis, clubbing, rash, edema Neuro: alert & orientedx3, cranial nerves grossly intact. moves all 4 extremities w/o difficulty. Affect pleasant  Telemetry: NSR (personally viewed)   Intake/Output:   Intake/Output Summary (Last 24 hours) at 11/11/15 1231 Last data filed at 11/11/15 0401  Gross per 24 hour  Intake    480 ml  Output   1000 ml  Net   -520 ml       Labs: Basic Metabolic Panel:  Recent Labs Lab 11/07/15 1044 11/09/15 0830 11/10/15 0327 11/11/15 0301  NA 140 137 142 141  K 4.2 4.1 4.1 4.0  CL 105 105 107 108  CO2 25 24 26 25   GLUCOSE 116* 101* 100* 101*  BUN 18 15 14 12   CREATININE 1.12 0.93 0.93 0.84  CALCIUM 9.7 9.3 9.4 9.5    Liver Function Tests: No results for input(s): AST, ALT, ALKPHOS, BILITOT, PROT, ALBUMIN in the last 168 hours. No results for  input(s): LIPASE, AMYLASE in the last 168 hours. No results for input(s): AMMONIA in the last 168 hours.  CBC:  Recent Labs Lab 11/07/15 1044 11/08/15 0545 11/08/15 1215 11/09/15 0830 11/10/15 0327 11/11/15 0301  WBC 7.4 6.9  --  6.5 6.5 5.7  NEUTROABS 5.8 5.6  --  5.1 4.9 4.3  HGB 8.8* 9.3* 9.4* 9.6* 9.2* 9.4*  HCT 30.2* 31.5* 32.4* 31.3* 31.5* 32.3*  MCV 104.1* 98.1  --  98.7 99.1 98.2  PLT 179 166  --  161 162 158    INR:  Recent Labs Lab 11/07/15 1044 11/08/15 0545 11/09/15 0611 11/10/15 0327 11/11/15 0301  INR 2.13* 1.71* 1.45 1.31 1.35    Other results:    Imaging: No results found.   Medications:     Scheduled Medications: . amiodarone  200 mg Oral Daily  . atorvastatin  40 mg Oral q1800  . budesonide-formoterol  2 puff Inhalation BID  . cholecalciferol  1,000 Units Oral BID  . citalopram  40 mg Oral Daily  . docusate sodium  100 mg Oral BID  . levothyroxine  125 mcg Oral QAC breakfast  . pantoprazole  40 mg Oral BID  . sodium chloride flush  10-40 mL Intracatheter Q12H  . tiotropium  18 mcg Inhalation Daily  . warfarin  5 mg Oral ONCE-1800  . Warfarin - Pharmacist Dosing Inpatient   Does  not apply q1800    Infusions: . heparin 1,700 Units/hr (11/11/15 0924)    PRN Medications:     Assessment:   1. Symptomatic Anemia/melena - H/O GI bleed most recent 2015 Had bleeding noted in jejunum requiring 3 clips.             - Enteroscopy this admission unremarkable.  2. PAF - In A Fib x 3-4 weeks, now NSR.  3. Chronic Systolic HF: S/P LVAD for DT 12/2012  4. Chronic Anticoagulation 5. OSA 6. H/O Epistaxis- Most recent 2/5 responded to Afrin.  7. Hemorrhoids  Plan/Discussion:    Enteroscopy 2/9 normal. No further melena/BRBPR. Hemoglobin stable. Tolerating heparin. Continue heparin/coumadin.  Suspect anemia at least partially related to epistaxis and hemorrhoids. If hgb stable, can go home when INR > 1.8 (INR 1.35). Plan for  outpatient banding of hemorrhoids.   Was in AF for 3-4 weeks pre-admit. Now back in NSR. Continue amiodarone.   Will give stool softener/laxative for constipation.   MAP ok  HF stable. VAD parameters ok.  I reviewed the LVAD parameters from today, and compared the results to the patient's prior recorded data.  No programming changes were made.  The LVAD is functioning within specified parameters.  The patient performs LVAD self-test daily.  LVAD interrogation was negative for any significant power changes, alarms or PI events/speed drops.  LVAD equipment check completed and is in good working order.  Back-up equipment present.   LVAD education done on emergency procedures and precautions and reviewed exit site care.  Length of Stay: 4  Loralie Champagne MD 11/11/2015, 12:31 PM  VAD Team --- VAD ISSUES ONLY--- Pager [phone] (7am - 7am)  Advanced Heart Failure Team  Pager [phone] (M-F; 7a - 4p)  Please contact [facility] for night-coverage after hours (4p -7a ) and weekends on [email or web address]

## 2015-11-11 NOTE — Progress Notes (Addendum)
ANTICOAGULATION CONSULT NOTE - Follow Up Consult  Pharmacy Consult for Heparin Indication: LVAD  No Known Allergies  Patient Measurements: Height: 6\' 1"  (185.4 cm) Weight: 217 lb 8 oz (98.657 kg) IBW/kg (Calculated) : 79.9 Heparin Dosing Weight: 98.7 kg  Vital Signs:    Labs:  Recent Labs  11/09/15 0611  11/09/15 0830  11/10/15 0327 11/10/15 1225 11/11/15 0301 11/11/15 1515  HGB  --   < > 9.6*  --  9.2*  --  9.4*  --   HCT  --   --  31.3*  --  31.5*  --  32.3*  --   PLT  --   --  161  --  162  --  158  --   LABPROT 17.7*  --   --   --  16.5*  --  16.8*  --   INR 1.45  --   --   --  1.31  --  1.35  --   HEPARINUNFRC 0.36  --   --   < > 0.15* 0.41 0.68 0.58  CREATININE  --   --  0.93  --  0.93  --  0.84  --   < > = values in this interval not displayed.  Estimated Creatinine Clearance: 95.4 mL/min (by C-G formula based on Cr of 0.84).  Assessment:  Heme/Anticoagulation:Lvad/PAF on chronic coumadin; HL 0.69>0.58 (goal 0.3-0.5). INR 1.35 (1.8-2.5).  --Home warf 5mg  TTSS/ 2.5mg  MWF with admit INR 2.1.  Goal of Therapy:  HL 0.3-0.5  INR 1.8-2.5 Monitor platelets by anticoagulation protocol: Yes   Plan:  Decrease heparin to 1550 units/hr  Warf 5mg  x1 Daily HL, INR goal 1.8-2.5   2205 PM: HL 0.45 now in goal range. Continue Iv heparin at 1550 units/hr recheck in AM.   Tierney Behl S. Alford Highland, PharmD, BCPS Clinical Staff Pharmacist Pager [phone]  Eilene Ghazi Stillinger 11/11/2015,4:28 PM

## 2015-11-11 NOTE — Progress Notes (Signed)
Peripherally Inserted Central Catheter/Midline Placement  The IV Nurse has discussed with the patient and/or persons authorized to consent for the patient, the purpose of this procedure and the potential benefits and risks involved with this procedure.  The benefits include less needle sticks, lab draws from the catheter and patient may be discharged home with the catheter.  Risks include, but not limited to, infection, bleeding, blood clot (thrombus formation), and puncture of an artery; nerve damage and irregular heat beat.  Alternatives to this procedure were also discussed.  PICC/Midline Placement Documentation  PICC Double Lumen 11/11/15 PICC Right Brachial 40 cm 0 cm (Active)  Indication for Insertion or Continuance of Line Prolonged intravenous therapies 11/11/2015 10:47 AM  Exposed Catheter (cm) 0 cm 11/11/2015 10:47 AM  Site Assessment Clean;Dry;Intact 11/11/2015 10:47 AM  Lumen #1 Status Flushed;Saline locked;Blood return noted 11/11/2015 10:47 AM  Lumen #2 Status Flushed;Saline locked;Blood return noted 11/11/2015 10:47 AM  Dressing Type Transparent 11/11/2015 10:47 AM  Dressing Change Due 11/18/15 11/11/2015 10:47 AM       Gordan Payment 11/11/2015, 10:48 AM

## 2015-11-11 NOTE — Progress Notes (Addendum)
ANTICOAGULATION CONSULT NOTE   Pharmacy Consult for heparin Indication: LVAD  No Known Allergies  Patient Measurements: Height: 6\' 1"  (185.4 cm) Weight: 217 lb 8 oz (98.657 kg) IBW/kg (Calculated) : 79.9 Heparin Dosing Weight: 100kg  Vital Signs: Temp: 98.1 F (36.7 C) (02/11 0358) Temp Source: Oral (02/11 0358) Pulse Rate: 84 (02/11 0358)  Labs:  Recent Labs  11/09/15 0611 11/09/15 0830  11/10/15 0327 11/10/15 1225 11/11/15 0301  HGB  --  9.6*  --  9.2*  --  9.4*  HCT  --  31.3*  --  31.5*  --  32.3*  PLT  --  161  --  162  --  158  LABPROT 17.7*  --   --  16.5*  --  16.8*  INR 1.45  --   --  1.31  --  1.35  HEPARINUNFRC 0.36  --   < > 0.15* 0.41 0.68  CREATININE  --  0.93  --  0.93  --  0.84  < > = values in this interval not displayed.  Estimated Creatinine Clearance: 95.4 mL/min (by C-G formula based on Cr of 0.84).  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 Quit smoking 2003. Uses CPAP and O2 every night. He is s/p LVAD HM II implanted 01/12/13 under DT criteria.   Patient presented to HF clinic for check up, c/o sob/fatigue. Hgb drawn was 8.8 down from 12. No overt bleeding/hematuria noted, patient did notice black stools PTA but also takes pepto-bismal.  He did have a very big nose bleed this week and suffers from frequent hemorrhoidal bleeding. S/p endoscopy with no abnormalities.  Warfarin was restarted 2/9. Heparin bridge resumed post enteroscopy 2/9 while INR < 1.6.   Heparin level 0.68 supratherapeutic on 1900 units/hr. Hgb 9.4 and plt wnl, stable. No bleeding noted.  Home dose warfarin 2.5mg  MWF/5mg  TTSS  Goal of Therapy:   INR 1.8-2.5 Heparin level 0.3-0.5 units/ml Monitor platelets by anticoagulation protocol: Yes   Plan:  Decrease heparin to 1700 units/hr  Daily HL, INR, CBC Warfarin 5mg  tonight    Heloise Ochoa,  Pharm.D., BCPS PGY2 Cardiology Pharmacy Resident Pager: [phone]  11/11/2015 8:35 AM

## 2015-11-12 DIAGNOSIS — I481 Persistent atrial fibrillation: Secondary | ICD-10-CM

## 2015-11-12 LAB — CBC WITH DIFFERENTIAL/PLATELET
BASOS ABS: 0 10*3/uL (ref 0.0–0.1)
BASOS PCT: 0 %
Eosinophils Absolute: 0.2 10*3/uL (ref 0.0–0.7)
Eosinophils Relative: 3 %
HEMATOCRIT: 32.9 % — AB (ref 39.0–52.0)
HEMOGLOBIN: 9.5 g/dL — AB (ref 13.0–17.0)
LYMPHS PCT: 9 %
Lymphs Abs: 0.6 10*3/uL — ABNORMAL LOW (ref 0.7–4.0)
MCH: 28.5 pg (ref 26.0–34.0)
MCHC: 28.9 g/dL — AB (ref 30.0–36.0)
MCV: 98.8 fL (ref 78.0–100.0)
MONO ABS: 0.6 10*3/uL (ref 0.1–1.0)
Monocytes Relative: 10 %
NEUTROS ABS: 4.7 10*3/uL (ref 1.7–7.7)
NEUTROS PCT: 78 %
Platelets: 153 10*3/uL (ref 150–400)
RBC: 3.33 MIL/uL — AB (ref 4.22–5.81)
RDW: 18.8 % — AB (ref 11.5–15.5)
WBC: 6.2 10*3/uL (ref 4.0–10.5)

## 2015-11-12 LAB — BASIC METABOLIC PANEL
Anion gap: 10 (ref 5–15)
BUN: 13 mg/dL (ref 6–20)
CHLORIDE: 109 mmol/L (ref 101–111)
CO2: 23 mmol/L (ref 22–32)
CREATININE: 0.89 mg/dL (ref 0.61–1.24)
Calcium: 9.7 mg/dL (ref 8.9–10.3)
GFR calc Af Amer: >60 mL/min (ref >60)
GFR calc non Af Amer: >60 mL/min (ref >60)
Glucose, Bld: 134 mg/dL — ABNORMAL HIGH (ref 65–99)
Potassium: 3.8 mmol/L (ref 3.5–5.1)
SODIUM: 142 mmol/L (ref 135–145)

## 2015-11-12 LAB — PROTIME-INR
INR: 1.51 — ABNORMAL HIGH (ref 0.00–1.49)
Prothrombin Time: 18.3 seconds — ABNORMAL HIGH (ref 11.6–15.2)

## 2015-11-12 LAB — HEPARIN LEVEL (UNFRACTIONATED): HEPARIN UNFRACTIONATED: 0.47 [IU]/mL (ref 0.30–0.70)

## 2015-11-12 LAB — LACTATE DEHYDROGENASE: LDH: 209 U/L — ABNORMAL HIGH (ref 98–192)

## 2015-11-12 MED ORDER — AMIODARONE HCL 200 MG PO TABS
400.0000 mg | ORAL_TABLET | Freq: Two times a day (BID) | ORAL | Status: DC
  Administered 2015-11-12 – 2015-11-14 (×4): 400 mg via ORAL
  Filled 2015-11-12 (×4): qty 2

## 2015-11-12 MED ORDER — WARFARIN SODIUM 3 MG PO TABS
6.0000 mg | ORAL_TABLET | Freq: Once | ORAL | Status: AC
  Administered 2015-11-12: 6 mg via ORAL
  Filled 2015-11-12: qty 2

## 2015-11-12 NOTE — Progress Notes (Signed)
Patient has had intermittent series of Afib throughout the night.  Patient has remained asymptomatic.  RN will continue to monitor patient

## 2015-11-12 NOTE — Progress Notes (Signed)
ANTICOAGULATION CONSULT NOTE - Follow Up Consult  Pharmacy Consult for Heparin Indication: LVAD  No Known Allergies  Patient Measurements: Height: 6\' 1"  (185.4 cm) Weight: 216 lb (97.977 kg) IBW/kg (Calculated) : 79.9 Heparin Dosing Weight: 98.7 kg  Vital Signs: Temp: 97.9 F (36.6 C) (02/12 0524) Temp Source: Oral (02/12 0524) Pulse Rate: 60 (02/12 0524)  Labs:  Recent Labs  11/10/15 0327  11/11/15 0301 11/11/15 1515 11/11/15 2105 11/12/15 0221 11/12/15 0240  HGB 9.2*  --  9.4*  --   --  9.5*  --   HCT 31.5*  --  32.3*  --   --  32.9*  --   PLT 162  --  158  --   --  153  --   LABPROT 16.5*  --  16.8*  --   --   --  18.3*  INR 1.31  --  1.35  --   --   --  1.51*  HEPARINUNFRC 0.15*  < > 0.68 0.58 0.45  --  0.47  CREATININE 0.93  --  0.84  --   --  0.89  --   < > = values in this interval not displayed.  Estimated Creatinine Clearance: 89.7 mL/min (by C-G formula based on Cr of 0.89).  Assessment:  Anticoagulation:Lvad/PAF on chronic coumadin; HL 0.47 (goal 0.3-0.5). INR 1.51 (1.8-2.5).  --Home warf 5mg  TTSS/ 2.5mg  MWF with admit INR 2.1.  Goal of Therapy:  HL 0.3-0.5  INR 1.8-2.5 Monitor platelets by anticoagulation protocol: Yes   Plan:  Heparin 1550 units/hr Warf 6mg  x1 Daily HL, CBC, and INR  [name], PharmD, BCPS Clinical Staff Pharmacist Pager [phone]  [name] 11/12/2015,2:00 PM

## 2015-11-12 NOTE — Progress Notes (Signed)
Patient ID: [name], male   DOB: 1940/10/01, 75 y.o.   MRN: [id number]    HeartMate 2 Rounding Note  Subjective:    Enteroscopy 2/9 was normal. Hemoglobin stable.  INR 1.5 today.   On heparin/coumadin.  He went into atrial fibrillation last night.  He does not tend to tolerate atrial fibrillation well, gets short of breath with exertion.  Has not walked yet.   LVAD INTERROGATION:  HeartMate II LVAD: Flow 4.8 liters/min, speed 9200, power 5.3, PI 6.5.  1 PI event in 24 hrs.    Objective:    Vital Signs:   Temp:  [97.8 F (36.6 C)-97.9 F (36.6 C)] 97.9 F (36.6 C) (02/12 0524) Pulse Rate:  [60-61] 60 (02/12 0524) Resp:  [18] 18 (02/12 0524) SpO2:  [96 %-97 %] 96 % (02/12 0844) Weight:  [216 lb (97.977 kg)] 216 lb (97.977 kg) (02/12 0524) Last BM Date: 11/09/15 Mean arterial Pressure 80s  Physical Exam: General:NAD HEENT: normal Neck: supple. JVP 6-7 . Carotids 2+ bilat; no bruits. No lymphadenopathy or thryomegaly appreciated. Cor: Mechanical heart sounds with LVAD hum present. Lungs: clear Abdomen: obese, soft, nontender, nondistended. No hepatosplenomegaly. No bruits or masses. Good bowel sounds. Driveline: C/D/I; securement device intact and driveline incorporated Extremities: no cyanosis, clubbing, rash, edema Neuro: alert & orientedx3, cranial nerves grossly intact. moves all 4 extremities w/o difficulty. Affect pleasant  Telemetry: NSR (personally viewed)   Intake/Output:   Intake/Output Summary (Last 24 hours) at 11/12/15 1327 Last data filed at 11/12/15 0854  Gross per 24 hour  Intake    740 ml  Output    700 ml  Net     40 ml       Labs: Basic Metabolic Panel:  Recent Labs Lab 11/07/15 1044 11/09/15 0830 11/10/15 0327 11/11/15 0301 11/12/15 0221  NA 140 137 142 141 142  K 4.2 4.1 4.1 4.0 3.8  CL 105 105 107 108 109  CO2 25 24 26 25 23   GLUCOSE 116* 101* 100* 101* 134*  BUN 18 15 14 12 13   CREATININE 1.12 0.93 0.93 0.84 0.89    CALCIUM 9.7 9.3 9.4 9.5 9.7    Liver Function Tests: No results for input(s): AST, ALT, ALKPHOS, BILITOT, PROT, ALBUMIN in the last 168 hours. No results for input(s): LIPASE, AMYLASE in the last 168 hours. No results for input(s): AMMONIA in the last 168 hours.  CBC:  Recent Labs Lab 11/08/15 0545 11/08/15 1215 11/09/15 0830 11/10/15 0327 11/11/15 0301 11/12/15 0221  WBC 6.9  --  6.5 6.5 5.7 6.2  NEUTROABS 5.6  --  5.1 4.9 4.3 4.7  HGB 9.3* 9.4* 9.6* 9.2* 9.4* 9.5*  HCT 31.5* 32.4* 31.3* 31.5* 32.3* 32.9*  MCV 98.1  --  98.7 99.1 98.2 98.8  PLT 166  --  161 162 158 153    INR:  Recent Labs Lab 11/08/15 0545 11/09/15 0611 11/10/15 0327 11/11/15 0301 11/12/15 0240  INR 1.71* 1.45 1.31 1.35 1.51*    Other results:    Imaging: No results found.   Medications:     Scheduled Medications: . amiodarone  400 mg Oral BID  . atorvastatin  40 mg Oral q1800  . budesonide-formoterol  2 puff Inhalation BID  . cholecalciferol  1,000 Units Oral BID  . citalopram  40 mg Oral Daily  . docusate sodium  100 mg Oral BID  . levothyroxine  125 mcg Oral QAC breakfast  . pantoprazole  40 mg Oral BID  .  sodium chloride flush  10-40 mL Intracatheter Q12H  . tiotropium  18 mcg Inhalation Daily  . Warfarin - Pharmacist Dosing Inpatient   Does not apply q1800    Infusions: . heparin 1,550 Units/hr (11/12/15 0044)    PRN Medications:     Assessment:   1. Symptomatic Anemia/melena - H/O GI bleed most recent 2015 Had bleeding noted in jejunum requiring 3 clips.             - Enteroscopy this admission unremarkable.  2. PAF - In A Fib x 3-4 weeks, now NSR.  3. Chronic Systolic HF: S/P LVAD for DT 12/2012  4. Chronic Anticoagulation 5. OSA 6. H/O Epistaxis- Most recent 2/5 responded to Afrin.  7. Hemorrhoids  Plan/Discussion:    Enteroscopy 2/9 normal. No further melena/BRBPR. Hemoglobin stable. Tolerating heparin. Continue heparin/coumadin.  Suspect  anemia at least partially related to epistaxis and hemorrhoids. If hgb stable, can go home when INR > 1.8 (INR 1.5 today). Plan for outpatient banding of hemorrhoids.   Was in AF for 3-4 weeks pre-admit. Was in NSR initially but now back in atrial fibrillation.  He tends to tolerate atrial fibrillation poorly.  I will increase amiodarone to 400 mg bid to reload.   MAP ok  VAD parameters ok.  I reviewed the LVAD parameters from today, and compared the results to the patient's prior recorded data.  No programming changes were made.  The LVAD is functioning within specified parameters.  The patient performs LVAD self-test daily.  LVAD interrogation was negative for any significant power changes, alarms or PI events/speed drops.  LVAD equipment check completed and is in good working order.  Back-up equipment present.   LVAD education done on emergency procedures and precautions and reviewed exit site care.  Length of Stay: 5  Loralie Champagne MD 11/12/2015, 1:27 PM  VAD Team --- VAD ISSUES ONLY--- Pager [phone] (7am - 7am)  Advanced Heart Failure Team  Pager [phone] (M-F; 7a - 4p)  Please contact [facility] for night-coverage after hours (4p -7a ) and weekends on [email or web address]

## 2015-11-13 LAB — CBC
HCT: 31.1 % — ABNORMAL LOW (ref 39.0–52.0)
Hemoglobin: 8.8 g/dL — ABNORMAL LOW (ref 13.0–17.0)
MCH: 28.3 pg (ref 26.0–34.0)
MCHC: 28.3 g/dL — ABNORMAL LOW (ref 30.0–36.0)
MCV: 100 fL (ref 78.0–100.0)
PLATELETS: 161 10*3/uL (ref 150–400)
RBC: 3.11 MIL/uL — ABNORMAL LOW (ref 4.22–5.81)
RDW: 19.6 % — AB (ref 11.5–15.5)
WBC: 6.5 10*3/uL (ref 4.0–10.5)

## 2015-11-13 LAB — BASIC METABOLIC PANEL
Anion gap: 12 (ref 5–15)
BUN: 16 mg/dL (ref 6–20)
CALCIUM: 9.6 mg/dL (ref 8.9–10.3)
CO2: 23 mmol/L (ref 22–32)
CREATININE: 0.87 mg/dL (ref 0.61–1.24)
Chloride: 107 mmol/L (ref 101–111)
GFR calc Af Amer: >60 mL/min (ref >60)
Glucose, Bld: 91 mg/dL (ref 65–99)
Potassium: 3.8 mmol/L (ref 3.5–5.1)
SODIUM: 142 mmol/L (ref 135–145)

## 2015-11-13 LAB — PROTIME-INR
INR: 1.63 — AB (ref 0.00–1.49)
Prothrombin Time: 19.4 seconds — ABNORMAL HIGH (ref 11.6–15.2)

## 2015-11-13 LAB — HEPARIN LEVEL (UNFRACTIONATED): Heparin Unfractionated: 0.38 IU/mL (ref 0.30–0.70)

## 2015-11-13 LAB — LACTATE DEHYDROGENASE: LDH: 211 U/L — AB (ref 98–192)

## 2015-11-13 MED ORDER — SORBITOL 70 % SOLN
30.0000 mL | Freq: Once | Status: AC
  Administered 2015-11-13: 30 mL via ORAL
  Filled 2015-11-13: qty 30

## 2015-11-13 MED ORDER — WARFARIN SODIUM 3 MG PO TABS
6.0000 mg | ORAL_TABLET | Freq: Once | ORAL | Status: AC
  Administered 2015-11-13: 6 mg via ORAL
  Filled 2015-11-13: qty 2

## 2015-11-13 NOTE — Care Management Important Message (Signed)
Important Message  Patient Details  Name: [name] MRN: [id number] Date of Birth: 1940-10-13   Medicare Important Message Given:  Yes    Leith Szafranski P [place] Brogden 11/13/2015, 2:08 PM

## 2015-11-13 NOTE — Progress Notes (Signed)
Utilization review completed.  

## 2015-11-13 NOTE — Progress Notes (Signed)
Patient ID: [name], male   DOB: 05/25/1941, 75 y.o.   MRN: [id number]    HeartMate 2 Rounding Note  Subjective:    Enteroscopy 2/9 was normal. Hemoglobin down a little from 9.5>8.8 . INR 1.63 day.   On heparin/coumadin.  He remains in atrial  fibrillation last night.  Denies SOB/Orthopnea. No BM in several days.    LVAD INTERROGATION:  HeartMate II LVAD: Flow 4.8 liters/min, speed 9200, power 5.3, PI 6.5.  1 PI event in 24 hrs.    Objective:    Vital Signs:   Temp:  [98.1 F (36.7 C)-98.4 F (36.9 C)] 98.1 F (36.7 C) (02/13 0444) Pulse Rate:  [67-71] 71 (02/13 0444) Resp:  [18] 18 (02/13 0444) SpO2:  [96 %-97 %] 97 % (02/13 0444) Weight:  [219 lb 8 oz (99.565 kg)] 219 lb 8 oz (99.565 kg) (02/13 0444) Last BM Date: 11/12/15 Mean arterial Pressure 70-80s  Physical Exam: General:NAD HEENT: normal Neck: supple. JVP 6-7 . Carotids 2+ bilat; no bruits. No lymphadenopathy or thryomegaly appreciated. Cor: Mechanical heart sounds with LVAD hum present. Lungs: clear Abdomen: obese, soft, nontender, nondistended. No hepatosplenomegaly. No bruits or masses. Good bowel sounds. Driveline: C/D/I; securement device intact and driveline incorporated Extremities: no cyanosis, clubbing, rash, edema. RUE PICC with pressure dressing.  Neuro: alert & orientedx3, cranial nerves grossly intact. moves all 4 extremities w/o difficulty. Affect pleasant  Telemetry: NSR (personally viewed)   Intake/Output:   Intake/Output Summary (Last 24 hours) at 11/13/15 0846 Last data filed at 11/12/15 2300  Gross per 24 hour  Intake    260 ml  Output    300 ml  Net    -40 ml       Labs: Basic Metabolic Panel:  Recent Labs Lab 11/09/15 0830 11/10/15 0327 11/11/15 0301 11/12/15 0221 11/13/15 0454  NA 137 142 141 142 142  K 4.1 4.1 4.0 3.8 3.8  CL 105 107 108 109 107  CO2 24 26 25 23 23   GLUCOSE 101* 100* 101* 134* 91  BUN 15 14 12 13 16   CREATININE 0.93 0.93 0.84 0.89 0.87   CALCIUM 9.3 9.4 9.5 9.7 9.6    Liver Function Tests: No results for input(s): AST, ALT, ALKPHOS, BILITOT, PROT, ALBUMIN in the last 168 hours. No results for input(s): LIPASE, AMYLASE in the last 168 hours. No results for input(s): AMMONIA in the last 168 hours.  CBC:  Recent Labs Lab 11/08/15 0545  11/09/15 0830 11/10/15 0327 11/11/15 0301 11/12/15 0221 11/13/15 0454  WBC 6.9  --  6.5 6.5 5.7 6.2 6.5  NEUTROABS 5.6  --  5.1 4.9 4.3 4.7  --   HGB 9.3*  < > 9.6* 9.2* 9.4* 9.5* 8.8*  HCT 31.5*  < > 31.3* 31.5* 32.3* 32.9* 31.1*  MCV 98.1  --  98.7 99.1 98.2 98.8 100.0  PLT 166  --  161 162 158 153 161  < > = values in this interval not displayed.  INR:  Recent Labs Lab 11/09/15 0611 11/10/15 0327 11/11/15 0301 11/12/15 0240 11/13/15 0453  INR 1.45 1.31 1.35 1.51* 1.63*    Other results:    Imaging: No results found.   Medications:     Scheduled Medications: . amiodarone  400 mg Oral BID  . atorvastatin  40 mg Oral q1800  . budesonide-formoterol  2 puff Inhalation BID  . cholecalciferol  1,000 Units Oral BID  . citalopram  40 mg Oral Daily  . docusate sodium  100  mg Oral BID  . levothyroxine  125 mcg Oral QAC breakfast  . pantoprazole  40 mg Oral BID  . sodium chloride flush  10-40 mL Intracatheter Q12H  . tiotropium  18 mcg Inhalation Daily  . Warfarin - Pharmacist Dosing Inpatient   Does not apply q1800    Infusions: . heparin 1,550 Units/hr (11/12/15 1834)    PRN Medications:     Assessment:   1. Symptomatic Anemia/melena - H/O GI bleed most recent 2015 Had bleeding noted in jejunum requiring 3 clips.             - Enteroscopy this admission unremarkable.  2. PAF - In A Fib x 3-4 weeks, now NSR.  3. Chronic Systolic HF: S/P LVAD for DT 12/2012  4. Chronic Anticoagulation 5. OSA 6. H/O Epistaxis- Most recent 2/5 responded to Afrin.  7. Hemorrhoids  Plan/Discussion:    Enteroscopy 2/9 normal. No further melena/BRBPR.  Hemoglobin stable. Tolerating heparin. Continue heparin/coumadin.  Suspect anemia at least partially related to epistaxis and hemorrhoids.Hgb trending down from 9.5>8.8. Watch closely. If hgb stable, can go home when INR > 1.8 (INR 1.6 today). Plan for outpatient banding of hemorrhoids.   Was in AF for 3-4 weeks pre-admit. Was in NSR initially but now back in atrial fibrillation.  He tends to tolerate atrial fibrillation poorly. Continue amiodarone to 400 mg bid .    MAP ok  VAD parameters ok.  I reviewed the LVAD parameters from today, and compared the results to the patient's prior recorded data.  No programming changes were made.  The LVAD is functioning within specified parameters.  The patient performs LVAD self-test daily.  LVAD interrogation was negative for any significant power changes, alarms or PI events/speed drops.  LVAD equipment check completed and is in good working order.  Back-up equipment present.   LVAD education done on emergency procedures and precautions and reviewed exit site care.  Length of Stay: [place] NP-C  11/13/2015, 8:46 AM  VAD Team --- VAD ISSUES ONLY--- Pager [phone] (7am - 7am)  Advanced Heart Failure Team  Pager [phone] (M-F; 7a - 4p)  Please contact [facility] for night-coverage after hours (4p -7a ) and weekends on [email or web address]  Patient seen and examined with [name], NP. We discussed all aspects of the encounter. I agree with the assessment and plan as stated above.   Overall doing ok. No further GI bleeding but having bleeding from PICC line which was reinforced today. Feels ok. HGb slightly lower. Will check CBC and Type and screen in am. Low threshold to transfuse another unit RBCS in am. Discussed with VAD coordinators.   Was back in AF yesterday and amio increased to 400 bid. Remains in AF but rate controlled. INR 1.6 today. Continue heparin and coumadin. CHF and MAPs ok. VAD parameters stable.   Keylin Podolsky,MD 7:36 PM

## 2015-11-13 NOTE — Discharge Instructions (Addendum)

## 2015-11-13 NOTE — Progress Notes (Signed)
ANTICOAGULATION CONSULT NOTE - Follow Up Consult  Pharmacy Consult for Heparin Indication: LVAD  No Known Allergies  Patient Measurements: Height: 6\' 1"  (185.4 cm) Weight: 219 lb 8 oz (99.565 kg) IBW/kg (Calculated) : 79.9 Heparin Dosing Weight: 98.7 kg  Vital Signs: Temp: 98.1 F (36.7 C) (02/13 0444) Temp Source: Oral (02/13 0444) Pulse Rate: 71 (02/13 0444)  Labs:  Recent Labs  11/11/15 0301  11/11/15 2105 11/12/15 0221 11/12/15 0240 11/13/15 0453 11/13/15 0454  HGB 9.4*  --   --  9.5*  --   --  8.8*  HCT 32.3*  --   --  32.9*  --   --  31.1*  PLT 158  --   --  153  --   --  161  LABPROT 16.8*  --   --   --  18.3* 19.4*  --   INR 1.35  --   --   --  1.51* 1.63*  --   HEPARINUNFRC 0.68  < > 0.45  --  0.47 0.38  --   CREATININE 0.84  --   --  0.89  --   --  0.87  < > = values in this interval not displayed.  Estimated Creatinine Clearance: 92.5 mL/min (by C-G formula based on Cr of 0.87).  Assessment:  Anticoagulation:Lvad/PAF on chronic coumadin; HL 0.38 (goal 0.3-0.5). INR 1.63 (1.8-2.5).  --Home warf 5mg  TTSS/ 2.5mg  MWF with admit INR 2.1.  Goal of Therapy:  HL 0.3-0.5  INR 1.8-2.5 Monitor platelets by anticoagulation protocol: Yes   Plan:  Continue Heparin 1550 units/hr Warf 6mg  x1 again today Daily HL, CBC, and INR  Erin Hearing PharmD., BCPS Clinical Pharmacist Pager [phone] 11/13/2015 12:29 PM

## 2015-11-14 ENCOUNTER — Encounter (HOSPITAL_COMMUNITY): Payer: Self-pay | Admitting: *Deleted

## 2015-11-14 DIAGNOSIS — I4891 Unspecified atrial fibrillation: Secondary | ICD-10-CM

## 2015-11-14 LAB — CBC
HCT: 31.3 % — ABNORMAL LOW (ref 39.0–52.0)
Hemoglobin: 8.8 g/dL — ABNORMAL LOW (ref 13.0–17.0)
MCH: 28.2 pg (ref 26.0–34.0)
MCHC: 28.1 g/dL — ABNORMAL LOW (ref 30.0–36.0)
MCV: 100.3 fL — AB (ref 78.0–100.0)
PLATELETS: 152 10*3/uL (ref 150–400)
RBC: 3.12 MIL/uL — ABNORMAL LOW (ref 4.22–5.81)
RDW: 20.5 % — AB (ref 11.5–15.5)
WBC: 5.5 10*3/uL (ref 4.0–10.5)

## 2015-11-14 LAB — PROTIME-INR
INR: 2 — ABNORMAL HIGH (ref 0.00–1.49)
PROTHROMBIN TIME: 22.5 s — AB (ref 11.6–15.2)

## 2015-11-14 LAB — HEPARIN LEVEL (UNFRACTIONATED): Heparin Unfractionated: 0.31 IU/mL (ref 0.30–0.70)

## 2015-11-14 LAB — LACTATE DEHYDROGENASE: LDH: 198 U/L — ABNORMAL HIGH (ref 98–192)

## 2015-11-14 LAB — PREPARE RBC (CROSSMATCH)

## 2015-11-14 MED ORDER — SODIUM CHLORIDE 0.9 % IV SOLN
Freq: Once | INTRAVENOUS | Status: AC
  Administered 2015-11-14: 11:00:00 via INTRAVENOUS

## 2015-11-14 MED ORDER — SORBITOL 70 % SOLN
30.0000 mL | Freq: Once | Status: DC
  Filled 2015-11-14: qty 30

## 2015-11-14 MED ORDER — WARFARIN SODIUM 2.5 MG PO TABS: 2.5000 mg | ORAL_TABLET | Freq: Once | ORAL | Status: DC

## 2015-11-14 MED ORDER — AMIODARONE HCL 200 MG PO TABS: 400.0000 mg | ORAL_TABLET | Freq: Every day | ORAL | Status: DC

## 2015-11-14 NOTE — Progress Notes (Signed)
Patient ID: [name], male   DOB: [birth year], 75 y.o.   MRN: [id number]    HeartMate 2 Rounding Note  Subjective:    Feels well. Anxious to go home. Back in AF. PICC line no longer bleeding.    LVAD INTERROGATION:  HeartMate II LVAD: Flow 4.9 liters/min, speed 9200, power 5.4, PI 6.4.  0 PI event in 24 hrs.    Objective:    Vital Signs:   Temp:  [97.2 F (36.2 C)-97.9 F (36.6 C)] 97.9 F (36.6 C) (02/14 1259) Pulse Rate:  [58-84] 58 (02/14 1259) Resp:  [16] 16 (02/14 1259) SpO2:  [97 %-99 %] 98 % (02/14 1259) Weight:  [100.29 kg (221 lb 1.6 oz)] 100.29 kg (221 lb 1.6 oz) (02/14 0500) Last BM Date: 11/13/15 Mean arterial Pressure 70-80s  Physical Exam: General:NAD HEENT: normal Neck: supple. JVP 6-7 . Carotids 2+ bilat; no bruits. No lymphadenopathy or thryomegaly appreciated. Cor: Mechanical heart sounds with LVAD hum present. Lungs: clear Abdomen: obese, soft, nontender, nondistended. No hepatosplenomegaly. No bruits or masses. Good bowel sounds. Driveline: C/D/I; securement device intact and driveline incorporated Extremities: no cyanosis, clubbing, rash, edema. RUE PICC Neuro: alert & orientedx3, cranial nerves grossly intact. moves all 4 extremities w/o difficulty. Affect pleasant  Telemetry: AF 70-80s (personally viewed)   Intake/Output:   Intake/Output Summary (Last 24 hours) at 11/14/15 1427 Last data filed at 11/14/15 1300  Gross per 24 hour  Intake    805 ml  Output    301 ml  Net    504 ml       Labs: Basic Metabolic Panel:  Recent Labs Lab 11/09/15 0830 11/10/15 0327 11/11/15 0301 11/12/15 0221 11/13/15 0454  NA 137 142 141 142 142  K 4.1 4.1 4.0 3.8 3.8  CL 105 107 108 109 107  CO2 24 26 25 23 23   GLUCOSE 101* 100* 101* 134* 91  BUN 15 14 12 13 16   CREATININE 0.93 0.93 0.84 0.89 0.87  CALCIUM 9.3 9.4 9.5 9.7 9.6    Liver Function Tests: No results for input(s): AST, ALT, ALKPHOS, BILITOT, PROT, ALBUMIN in the last 168  hours. No results for input(s): LIPASE, AMYLASE in the last 168 hours. No results for input(s): AMMONIA in the last 168 hours.  CBC:  Recent Labs Lab 11/08/15 0545  11/09/15 0830 11/10/15 0327 11/11/15 0301 11/12/15 0221 11/13/15 0454 11/14/15 0415  WBC 6.9  --  6.5 6.5 5.7 6.2 6.5 5.5  NEUTROABS 5.6  --  5.1 4.9 4.3 4.7  --   --   HGB 9.3*  < > 9.6* 9.2* 9.4* 9.5* 8.8* 8.8*  HCT 31.5*  < > 31.3* 31.5* 32.3* 32.9* 31.1* 31.3*  MCV 98.1  --  98.7 99.1 98.2 98.8 100.0 100.3*  PLT 166  --  161 162 158 153 161 152  < > = values in this interval not displayed.  INR:  Recent Labs Lab 11/10/15 0327 11/11/15 0301 11/12/15 0240 11/13/15 0453 11/14/15 0415  INR 1.31 1.35 1.51* 1.63* 2.00*    Other results:    Imaging: No results found.   Medications:     Scheduled Medications: . amiodarone  400 mg Oral BID  . atorvastatin  40 mg Oral q1800  . budesonide-formoterol  2 puff Inhalation BID  . cholecalciferol  1,000 Units Oral BID  . citalopram  40 mg Oral Daily  . docusate sodium  100 mg Oral BID  . levothyroxine  125 mcg Oral QAC breakfast  .  pantoprazole  40 mg Oral BID  . sodium chloride flush  10-40 mL Intracatheter Q12H  . sorbitol  30 mL Oral Once  . tiotropium  18 mcg Inhalation Daily  . warfarin  2.5 mg Oral ONCE-1800  . Warfarin - Pharmacist Dosing Inpatient   Does not apply q1800    Infusions:    PRN Medications:     Assessment:   1. Symptomatic Anemia/melena - H/O GI bleed most recent 2015 Had bleeding noted in jejunum requiring 3 clips.             - Enteroscopy this admission unremarkable.  2. PAF - In A Fib x 3-4 weeks, now NSR.  3. Chronic Systolic HF: S/P LVAD for DT 12/2012  4. Chronic Anticoagulation 5. OSA 6. H/O Epistaxis- Most recent 2/5 responded to Afrin.  7. Hemorrhoids  Plan/Discussion:    Enteroscopy 2/9 normal. No further melena/BRBPR.   Feels well. Anxious to go home. INR 2.0. In/out of AF. Hgb down to  8.8  Will give one more unit RBCs today and plan d/c later today. Will need outpatient banding of hemorrhoids. Also attempting to get octreottide.   Was in AF for 3-4 weeks pre-admit. Was in NSR initially but now back in atrial fibrillation.  He tends to tolerate atrial fibrillation poorly. Discharge on amiodarone to 200 mg bid .    MAP ok  VAD parameters ok.  I reviewed the LVAD parameters from today, and compared the results to the patient's prior recorded data.  No programming changes were made.  The LVAD is functioning within specified parameters.  The patient performs LVAD self-test daily.  LVAD interrogation was negative for any significant power changes, alarms or PI events/speed drops.  LVAD equipment check completed and is in good working order.  Back-up equipment present.   LVAD education done on emergency procedures and precautions and reviewed exit site care.  Length of Stay: 7  Glori Bickers MD  11/14/2015, 2:27 PM  VAD Team --- VAD ISSUES ONLY--- Pager [phone] (7am - 7am)  Advanced Heart Failure Team  Pager [phone] (M-F; 7a - 4p)  Please contact [facility] for night-coverage after hours (4p -7a ) and weekends on [email or web address]

## 2015-11-14 NOTE — Progress Notes (Signed)
Pt discharging home with wife.  All prescriptions and instructions given and reivewed, all questions answered.

## 2015-11-14 NOTE — Progress Notes (Signed)
ANTICOAGULATION CONSULT NOTE - Follow Up Consult  Pharmacy Consult for Heparin>>warfarin  Indication: LVAD  No Known Allergies  Patient Measurements: Height: 6\' 1"  (185.4 cm) Weight: 221 lb 1.6 oz (100.29 kg) IBW/kg (Calculated) : 79.9 Heparin Dosing Weight: 98.7 kg  Vital Signs: Temp: 97.3 F (36.3 C) (02/14 0600) Temp Source: Oral (02/14 0600) Pulse Rate: 58 (02/14 0600)  Labs:  Recent Labs  11/12/15 0221 11/12/15 0240 11/13/15 0453 11/13/15 0454 11/14/15 0415  HGB 9.5*  --   --  8.8* 8.8*  HCT 32.9*  --   --  31.1* 31.3*  PLT 153  --   --  161 152  LABPROT  --  18.3* 19.4*  --  22.5*  INR  --  1.51* 1.63*  --  2.00*  HEPARINUNFRC  --  0.47 0.38  --  0.31  CREATININE 0.89  --   --  0.87  --     Estimated Creatinine Clearance: 92.8 mL/min (by C-G formula based on Cr of 0.87).  Assessment:  Anticoagulation:Lvad/PAF on chronic coumadin; HL 0.31 (goal 0.3-0.5). INR 1.63>2.0 with goal (1.8-2.5). No bleeding noted. Likely home later today  --Home warf 5mg  TTSS/ 2.5mg  MWF with admit INR 2.1.  Goal of Therapy:  HL 0.3-0.5  INR 1.8-2.5 Monitor platelets by anticoagulation protocol: Yes   Plan:  Stop Heparin Warf 2.5mg  x1 today then restart home dose tomorrow/discharge Daily CBC, and INR  Erin Hearing PharmD., BCPS Clinical Pharmacist Pager [phone] 11/14/2015 8:45 AM

## 2015-11-14 NOTE — Discharge Summary (Signed)
Advanced Heart Failure Team  Discharge Summary   Patient ID: CHRISTOFHER EASTMAN MRN: [id number], DOB/AGE: September 06, 1941 75 y.o. Admit date: 11/07/2015 D/C date:     11/14/2015   Primary Discharge Diagnoses:  1. Symptomatic Anemia/melena - H/O GI bleed most recent 2015 Had bleeding noted in jejunum requiring 3 clips.  - Enteroscopy 11/09/15 unremarkable.  2. PAF - In A Fib x 3-4 weeks, now NSR. On chronic coumadin and amio 400 mg daily  3. Chronic Systolic HF: S/P LVAD for DT 12/2012  4. Chronic Anticoagulation 5. OSA 6. H/O Epistaxis- Most recent 2/5 responded to Afrin.  7. 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 Quit smoking 2003. Uses CPAP and O2 every night. He is s/p LVAD HM II implanted 01/12/13 under DT criteria.   Over the last few years he has had 4 admits for GI bleed. Most recent was December 2015 and required enteroscopy that showed bleed from jejunum requiring 3 clips.  On February 7th he returned to LVAD clinic for routine follow up. Complaining of increased dyspnea and fatigue. Over the last week he reports melena. On Sunday he had nose bleed controlled afrin.  Labs checked in the clinic and showed hemoglobin 8.8 which was down from 12.1. He was admitted for evaluation of symptomatic anemia. Overall he was transfused 3 units PRBCs. GI consulted. Coumadin was stopped. Once INR drifted down he had enteroscopy on February 9th that was unremarkable. Anemia thought to be from hemorrhoids and epistaxis. Heparin and coumadin restarted and monitored closely. INR on the day of discharge 2.0.    On admit he was in A fib that historically he tolerates poorly so amio was increased. He will continue amio 400 mg daily. From HF perspective he remained stable. He will continue to be followed closley in the HF clinic and  has follow next week. Plan to check INR at that time.    LVAD INTERROGATION:  HeartMate II LVAD: Flow 4.9 liters/min, speed 9200, power 5.4, PI 6.4. 0 PI event in 24 hrs.   Discharge Weight: 221 pounds  Discharge Vitals: Blood pressure 94/69, pulse 58, temperature 97.9 F (36.6 C), temperature source Oral, resp. rate 16, height 6\' 1"  (1.854 m), weight 221 lb 1.6 oz (100.29 kg), SpO2 98 %.  Labs: Lab Results  Component Value Date   WBC 5.5 11/14/2015   HGB 8.8* 11/14/2015   HCT 31.3* 11/14/2015   MCV 100.3* 11/14/2015   PLT 152 11/14/2015     Recent Labs Lab 11/13/15 0454  NA 142  K 3.8  CL 107  CO2 23  BUN 16  CREATININE 0.87  CALCIUM 9.6  GLUCOSE 91   Lab Results  Component Value Date   CHOL 126 07/02/2012   HDL 40.80 07/02/2012   LDLCALC 58 07/02/2012   TRIG 134.0 07/02/2012   BNP (last 3 results)  Recent Labs  07/04/15 1147 08/22/15 1300 11/07/15 1043  BNP 267.1* 203.2* 398.1*    ProBNP (last 3 results) No results for input(s): PROBNP in the last 8760 hours.   Diagnostic Studies/Procedures   No results found.  Discharge Medications     Medication List    TAKE these medications        alum & mag hydroxide-simeth 200-200-20 MG/5ML suspension  Commonly known as:  MAALOX/MYLANTA  Take 30 mLs by mouth every 6 (six) hours as  needed for indigestion or heartburn.     amiodarone 200 MG tablet  Commonly known as:  PACERONE  Take 2 tablets (400 mg total) by mouth daily.     amoxicillin 500 MG tablet  Commonly known as:  AMOXIL  Take 2 gms thirty minutes - one hour prior to procedure     bismuth subsalicylate 99991111 [id number] suspension  Commonly known as:  PEPTO BISMOL  Take 30 mLs by mouth every 6 (six) hours as needed for indigestion or diarrhea or loose stools.     budesonide-formoterol 160-4.5 MCG/ACT inhaler  Commonly known as:  SYMBICORT  Inhale 2 puffs into the lungs 2 (two) times daily.     cholecalciferol 1000 units tablet  Commonly  known as:  VITAMIN D  Take 1 tablet (1,000 Units total) by mouth 2 (two) times daily.     citalopram 40 MG tablet  Commonly known as:  CELEXA  Take 1 tablet (40 mg total) by mouth at bedtime.     docusate sodium 100 MG capsule  Commonly known as:  COLACE  Take 200 mg by mouth 2 (two) times daily.     furosemide 40 MG tablet  Commonly known as:  LASIX  Take 40 mg by mouth as needed for fluid.     guaiFENesin 600 MG 12 hr tablet  Commonly known as:  MUCINEX  Take 600 mg by mouth 2 (two) times daily as needed for cough or to loosen phlegm.     levothyroxine 125 MCG tablet  Commonly known as:  SYNTHROID, LEVOTHROID  Take 125 mcg by mouth daily before breakfast.     losartan 50 MG tablet  Commonly known as:  COZAAR  Take 1.5 tablets (75 mg total) by mouth at bedtime.     magnesium hydroxide 400 MG/5ML suspension  Commonly known as:  MILK OF MAGNESIA  Take 15 mLs by mouth daily as needed for moderate constipation.     MULTIVITAMIN PO  Take 1 tablet by mouth at bedtime.     nitroGLYCERIN 0.4 MG SL tablet  Commonly known as:  NITROSTAT  Place 0.4 mg under the tongue every 5 (five) minutes as needed for chest pain. Reported on 11/07/2015     pantoprazole 40 MG tablet  Commonly known as:  PROTONIX  Take 1 tablet (40 mg total) by mouth 2 (two) times daily.     potassium chloride 10 MEQ tablet  Commonly known as:  K-DUR  Take 10 mEq by mouth as needed (when taking furosemide).     simvastatin 80 MG tablet  Commonly known as:  ZOCOR  Take 0.5 tablets (40 mg total) by mouth at bedtime.     tiotropium 18 MCG inhalation capsule  Commonly known as:  SPIRIVA  Place 1 capsule (18 mcg total) into inhaler and inhale daily.     vitamin B-12 1000 MCG tablet  Commonly known as:  CYANOCOBALAMIN  Take 1,000 mcg by mouth at bedtime.     vitamin C 500 MG tablet  Commonly known as:  ASCORBIC ACID  Take 1 tablet (500 mg total) by mouth 2 (two) times daily.     warfarin 5 MG tablet   Commonly known as:  COUMADIN  Take 2.5 mg on Mon and Fri. All other days take 5 mg . On  6/21 take 2.5 mg. .        Disposition   The patient will be discharged in stable condition to home. Discharge Instructions    Diet - low  sodium heart healthy    Complete by:  As directed      Increase activity slowly    Complete by:  As directed           Follow-up Information    Follow up with Glori Bickers, MD On 11/21/2015.   Specialty:  Cardiology   Why:  at 1:00 Garage Code 0001    Contact information:   [place] [address] [phone]         Duration of Discharge Encounter: Greater than 35 minutes   Signed, [name] NP-C 11/14/2015, 2:44 PM  Patient seen and examined with [name], NP. We discussed all aspects of the encounter. I agree with the assessment and plan as stated above.   He is ready for d/c after repeat transfusion. F/u in VAD clinic and with GI.   Bensimhon, DanielMD

## 2015-11-15 LAB — TYPE AND SCREEN
ABO/RH(D): O POS
Antibody Screen: NEGATIVE
UNIT DIVISION: 0

## 2015-11-16 NOTE — Progress Notes (Signed)
Pt's primary and back up controller 11V batteries replaced due to near end of life on current batteries.  Replacement 11V batteries:   Primary controller:  [id number] Expires 05/14/17  Back up controller:  [id number] Expires 05/14/17

## 2015-11-17 ENCOUNTER — Other Ambulatory Visit: Payer: Self-pay | Admitting: Hematology

## 2015-11-17 ENCOUNTER — Ambulatory Visit (HOSPITAL_BASED_OUTPATIENT_CLINIC_OR_DEPARTMENT_OTHER): Payer: Non-veteran care

## 2015-11-17 VITALS — BP 112/74 | HR 63 | Temp 97.5°F | Resp 20

## 2015-11-17 DIAGNOSIS — D500 Iron deficiency anemia secondary to blood loss (chronic): Secondary | ICD-10-CM

## 2015-11-17 DIAGNOSIS — D62 Acute posthemorrhagic anemia: Secondary | ICD-10-CM

## 2015-11-17 DIAGNOSIS — K922 Gastrointestinal hemorrhage, unspecified: Secondary | ICD-10-CM

## 2015-11-17 MED ORDER — SODIUM CHLORIDE 0.9 % IV SOLN
INTRAVENOUS | Status: DC
  Administered 2015-11-17: 13:00:00 via INTRAVENOUS

## 2015-11-17 MED ORDER — SODIUM CHLORIDE 0.9 % IV SOLN
510.0000 mg | Freq: Once | INTRAVENOUS | Status: AC
  Administered 2015-11-17: 510 mg via INTRAVENOUS
  Filled 2015-11-17: qty 17

## 2015-11-17 NOTE — Progress Notes (Signed)
Pt in for Feraheme infusion. Received dose on 2/10 while in the hospital. Discussed plan with Dr. Burr Medico, [place] to release additional Feraheme order. Pt to follow up with MD as scheduled next week.

## 2015-11-21 ENCOUNTER — Ambulatory Visit (HOSPITAL_COMMUNITY): Payer: Self-pay | Admitting: *Deleted

## 2015-11-21 ENCOUNTER — Other Ambulatory Visit (HOSPITAL_COMMUNITY): Payer: Self-pay | Admitting: *Deleted

## 2015-11-21 ENCOUNTER — Ambulatory Visit (HOSPITAL_COMMUNITY)
Admission: RE | Admit: 2015-11-21 | Discharge: 2015-11-21 | Disposition: A | Discharge status: Home / Self Care | Payer: Non-veteran care | Source: Ambulatory Visit | Attending: Cardiology | Admitting: Cardiology

## 2015-11-21 VITALS — BP 120/0 | HR 62 | Ht 73.0 in | Wt 235.8 lb

## 2015-11-21 DIAGNOSIS — D649 Anemia, unspecified: Secondary | ICD-10-CM | POA: Diagnosis not present

## 2015-11-21 DIAGNOSIS — E785 Hyperlipidemia, unspecified: Secondary | ICD-10-CM | POA: Diagnosis not present

## 2015-11-21 DIAGNOSIS — F329 Major depressive disorder, single episode, unspecified: Secondary | ICD-10-CM | POA: Insufficient documentation

## 2015-11-21 DIAGNOSIS — K625 Hemorrhage of anus and rectum: Secondary | ICD-10-CM

## 2015-11-21 DIAGNOSIS — G4733 Obstructive sleep apnea (adult) (pediatric): Secondary | ICD-10-CM | POA: Diagnosis not present

## 2015-11-21 DIAGNOSIS — I110 Hypertensive heart disease with heart failure: Secondary | ICD-10-CM | POA: Insufficient documentation

## 2015-11-21 DIAGNOSIS — K649 Unspecified hemorrhoids: Secondary | ICD-10-CM | POA: Diagnosis not present

## 2015-11-21 DIAGNOSIS — Z6831 Body mass index (BMI) 31.0-31.9, adult: Secondary | ICD-10-CM | POA: Insufficient documentation

## 2015-11-21 DIAGNOSIS — I255 Ischemic cardiomyopathy: Secondary | ICD-10-CM | POA: Diagnosis not present

## 2015-11-21 DIAGNOSIS — I5022 Chronic systolic (congestive) heart failure: Secondary | ICD-10-CM | POA: Insufficient documentation

## 2015-11-21 DIAGNOSIS — I10 Essential (primary) hypertension: Secondary | ICD-10-CM | POA: Diagnosis not present

## 2015-11-21 DIAGNOSIS — I4891 Unspecified atrial fibrillation: Secondary | ICD-10-CM

## 2015-11-21 DIAGNOSIS — Z79899 Other long term (current) drug therapy: Secondary | ICD-10-CM | POA: Diagnosis not present

## 2015-11-21 DIAGNOSIS — Z95811 Presence of heart assist device: Secondary | ICD-10-CM | POA: Insufficient documentation

## 2015-11-21 DIAGNOSIS — Z7901 Long term (current) use of anticoagulants: Secondary | ICD-10-CM

## 2015-11-21 DIAGNOSIS — E669 Obesity, unspecified: Secondary | ICD-10-CM | POA: Insufficient documentation

## 2015-11-21 DIAGNOSIS — J449 Chronic obstructive pulmonary disease, unspecified: Secondary | ICD-10-CM | POA: Diagnosis not present

## 2015-11-21 DIAGNOSIS — E039 Hypothyroidism, unspecified: Secondary | ICD-10-CM | POA: Diagnosis not present

## 2015-11-21 DIAGNOSIS — Z951 Presence of aortocoronary bypass graft: Secondary | ICD-10-CM | POA: Insufficient documentation

## 2015-11-21 DIAGNOSIS — R0902 Hypoxemia: Secondary | ICD-10-CM | POA: Diagnosis not present

## 2015-11-21 DIAGNOSIS — I252 Old myocardial infarction: Secondary | ICD-10-CM | POA: Diagnosis not present

## 2015-11-21 DIAGNOSIS — I5020 Unspecified systolic (congestive) heart failure: Secondary | ICD-10-CM

## 2015-11-21 DIAGNOSIS — Z87891 Personal history of nicotine dependence: Secondary | ICD-10-CM | POA: Insufficient documentation

## 2015-11-21 DIAGNOSIS — I2510 Atherosclerotic heart disease of native coronary artery without angina pectoris: Secondary | ICD-10-CM | POA: Diagnosis not present

## 2015-11-21 DIAGNOSIS — I480 Paroxysmal atrial fibrillation: Secondary | ICD-10-CM | POA: Insufficient documentation

## 2015-11-21 LAB — CBC
HEMATOCRIT: 39.3 % (ref 39.0–52.0)
HEMOGLOBIN: 11.2 g/dL — AB (ref 13.0–17.0)
MCH: 28.6 pg (ref 26.0–34.0)
MCHC: 28.5 g/dL — ABNORMAL LOW (ref 30.0–36.0)
MCV: 100.3 fL — AB (ref 78.0–100.0)
Platelets: 146 10*3/uL — ABNORMAL LOW (ref 150–400)
RBC: 3.92 MIL/uL — ABNORMAL LOW (ref 4.22–5.81)
RDW: 19.5 % — AB (ref 11.5–15.5)
WBC: 6.3 10*3/uL (ref 4.0–10.5)

## 2015-11-21 LAB — BASIC METABOLIC PANEL
ANION GAP: 6 (ref 5–15)
BUN: 13 mg/dL (ref 6–20)
CHLORIDE: 109 mmol/L (ref 101–111)
CO2: 26 mmol/L (ref 22–32)
Calcium: 9.9 mg/dL (ref 8.9–10.3)
Creatinine, Ser: 1.02 mg/dL (ref 0.61–1.24)
GFR calc Af Amer: >60 mL/min (ref >60)
GFR calc non Af Amer: >60 mL/min (ref >60)
Glucose, Bld: 113 mg/dL — ABNORMAL HIGH (ref 65–99)
POTASSIUM: 4.2 mmol/L (ref 3.5–5.1)
SODIUM: 141 mmol/L (ref 135–145)

## 2015-11-21 LAB — PROTIME-INR
INR: 2.68 — AB (ref 0.00–1.49)
Prothrombin Time: 28.1 seconds — ABNORMAL HIGH (ref 11.6–15.2)

## 2015-11-21 LAB — LACTATE DEHYDROGENASE: LDH: 265 U/L — AB (ref 98–192)

## 2015-11-21 MED ORDER — LOSARTAN POTASSIUM 100 MG PO TABS: 100.0000 mg | ORAL_TABLET | Freq: Every day | ORAL | Status: DC

## 2015-11-21 MED ORDER — HYDROCORTISONE 2.5 % RE CREA: 1.0000 "application " | TOPICAL_CREAM | Freq: Two times a day (BID) | RECTAL | Status: AC

## 2015-11-21 MED ORDER — HYDROCORTISONE 2.5 % RE CREA: 1.0000 "application " | TOPICAL_CREAM | Freq: Two times a day (BID) | RECTAL | Status: DC

## 2015-11-21 NOTE — Progress Notes (Addendum)
Symptom  Yes  No  Details   Angina         x Activity:   Claudication                x How far:  Left leg "giving out"  Syncope         x When:   Stroke         x   Orthopnea         x How many pillows:   2 for comfort  PND         x How often:  CPAP         x  How many hrs:  All night  Pedal edema         x   Abd fullness         x   N&V         x   Diaphoresis                x When:   Bleeding               x   Urine color    medium yellow  SOB        x  Activity:  Incline; one block level ground   Palpitations         x When:  ICD shock         x   Hospitlizaitons        x        When/where/why:  2/7 - 11/14/15 for symptomatic anemia  ED visit               x When/where/why:   Other MD              x When/who/why:    Activity    No formal activity; sedentary  Fluid    No limitations  Diet    No limitations   Vital signs: HR:  62 Doppler MAP: 120 Auto cuff:  114/85 (93);  113/85 (98) O2 Sat: 98% Wt:  235.8 lbs Last weight:  231.4 lbs Home wts:  219 - 224 lbs Ht: 6'1"  LVAD interrogation reveals (reviewed with Dr Aundra Dubin):  Speed: 9200 Flow:  5.2 Power: 5.4 PI:  6.6 Alarms: none Events: 0 - 5 PI events        Fixed speed:  9200 Low speed limit: 8600 Primary Controller:  Replace back up battery in 31 months  Back up controller:   Replace back up battery in 31 months   LVAD exit site:  Well healed and incorporated. The velour is fully implanted at exit site. Dressing dry and intact. No erythema or drainage. Stabilization device present and accurately applied. Driveline dressing is being changed weekly per sterile technique using Sorbaview dressing without biopatch (causes skin irritation) exit site. Pt denies fever or chills. Dressing supplies provided to patient.   Encounter Details: Presents to clinic today with his wife for hospital discharge follow up; hospital admission 2/7 - 11/14/15 for symptomatic anemia. Reports feeling well since being discharged from hospital  on 11/14/15. No complaints today. Has not taken lasix since discharge; home weights 219 - 224 lbs.  Hgb --> 11.2 (up from 8.8) at time of discharge.   INR --> 2.68. Goal 1.8 - 2.5 w/o ASA (GIB). See anticoag note.   LDH -->  265. Stable with established baseline.   MAP--> 120 doppler and 114/85 (93); repeat 113/85 (98)  EKG obtained and reviewed by Dr. Aundra Dubin, pt in sinus rhythm today.   Patient Instructions: 1.  Re-check INR in two weeks on 12/05/15. 2.  Increase Losartan to 100 mg daily. 3.  Anusol cream as needed for hemorrhoids. 4.  Return to [facility] clinic in one month.  Will be your 3 year follow up. Please bring your power module and Charity fundraiser  for annual maintenance. Plan on being here for @ 2 hours. Will perform 6 minute walk.  Zada Girt, RN VAD Coordinator  Office: [phone] 24/7 VAD Pager: [phone]  Patient ID: [name], male   DOB: 02-14-1941, 75 y.o.   MRN: [id number]    LVAD CLINIC NOTE  [facility] [place] (Dr. 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 Quit smoking 2003. Uses CPAP and O2 every night. He is s/p LVAD HM II implanted 01/12/13 under DT criteria  Admitted to [facility] 09/13/13 - 09/14/13 for increased fatigue and dyspnea. Found to have mild CHF and anemia. Diuresed with IV lasix and transitioned to PO lasix 40 mg twice a week. Discharge weight 203 lbs on inpatient scale.  Admitting labs revealed Hgb 7.7 for which he received 2 units PCs with appropriate rise in Hgb.   Admit 6/8-6/12 for symptomatic anemia. Had EGD/Colonoscopy which was unrevealing.  6 mm polyp clipped and removed. CT abdomen showed cirrhosis. Placed on heparin bridge. Capsule study pending. Sent home on lovenox. D/C weight 206 lbs.   Admitted to [facility] with with GI bleed. GI  consulted. Push enteroscopy 10/28 negative. Colonoscopy 10/29 without source of bleeding.Result of capsule endoscopy--> Showed bleeding from duodenum. EGD--->Bleeding from duodenum with clip applied. Overall he received  10 UPRBCs. Discharge weight was 207 pounds.   Admitted to [facility] 09/21/14 with GI bleed . GI consulted. Enteroscopy showed bleed from jejunum requiring 3 clips. Overall he received 4 units PRBCs.  He went back in A fib and had DC-CV.  On the day discharge he was back in NSR on amiodarone 200 mg twice a day. Discharge weight was 214 pounds.   Admitted in 6/16 for ICD extraction due to pocket infection. Did well. No complications.   Admitted 2/17 with BRPBR.  Ultimately, thought to be due to epistaxis + hemorrhoidal bleeding.  Enteroscopy was normal during this hospitalization.  No further bleeding since discharge. Feels like he has been in atrial fibrillation occasionally but is in NSR today.  He was in atrial fibrillation during 2/17 hospitalization, and amiodarone was increased to 400 mg daily.  MAP in 90s.  No dyspnea walking on flat ground.  Has not used Lasix.   Denies LVAD alarms. Denies driveline trauma, erythema or drainage.  Denies ICD shocks. Reports taking Coumadin as prescribed and adherence to anticoagulation based dietary restrictions. Denies bright red blood per rectum or melena, no dark urine or hematuria.   ECG: NSR, left axis deviation, lateral TWIs  LVAD interrogation reveals:  See nurse's note for details.  Labs (2/16): LDL 305, HCT 38.9 Labs (3/16): K 3.9, creatinine 1.05, LFTs normal Labs (2/17): WBCs 11.2, INR 2.68.   Past Medical History  Diagnosis Date  . Ischemic cardiomyopathy      CABG 2003, PCI 2007  EF 27%(myoview 2012  . Chronic systolic heart failure ([place])   . Hyperlipidemia   . Hypothyroidism   .  Chronic anticoagulation     Afib and LVAD  . Obesity   . COPD (chronic obstructive pulmonary disease) ([place])   . Asbestosis([place])     "6 years in  the [place]" (05/24/2013)  . Atrial fibrillation ([place])     permanent  . Paroxysmal ventricular tachycardia ([place])   . Coronary artery disease   . Hypertension   . Depression   . LVAD (left ventricular assist device) present ([place]) 12/2012  . Epistaxis 11/2015, 07/2014  . Cancer ([place])     "scraped some off behind my left ear; fast moving; having it cut out 12/05/2015" (11/07/2015)  . CHF (congestive heart failure) ([place])   . Myocardial infarction ([place]) 1990's-2000    "2 in  ~ the 1990's; 1 in ~ 2000" (11/07/2015)  . On home oxygen therapy     "have it available; don't use it" (11/07/2015)  . OSA on CPAP   . Anemia   . History of blood transfusion 08/2015 X 10; 11/2015 X 2    "related to bleeding on the inside somewhere" (11/07/2015)  . Complication of anesthesia     "they can't put me all the way to sleep cause of my heart" (11/07/2015)    Current Outpatient Prescriptions  Medication Sig Dispense Refill  . alum & mag hydroxide-simeth (MAALOX/MYLANTA) 200-200-20 MG/5ML suspension Take 30 mLs by mouth every 6 (six) hours as needed for indigestion or heartburn.    [name] amiodarone (PACERONE) 200 MG tablet Take 2 tablets (400 mg total) by mouth daily. 90 tablet 3  . budesonide-formoterol (SYMBICORT) 160-4.5 MCG/ACT inhaler Inhale 2 puffs into the lungs 2 (two) times daily. 1 Inhaler 3  . cholecalciferol (VITAMIN D) 1000 UNITS tablet Take 1 tablet (1,000 Units total) by mouth 2 (two) times daily. 180 tablet 3  . citalopram (CELEXA) 40 MG tablet Take 1 tablet (40 mg total) by mouth at bedtime. 90 tablet 3  . docusate sodium (COLACE) 100 MG capsule Take 200 mg by mouth 2 (two) times daily.    [name] levothyroxine (SYNTHROID, LEVOTHROID) 125 MCG tablet Take 125 mcg by mouth daily before breakfast.    . losartan (COZAAR) 100 MG tablet Take 1 tablet (100 mg total) by mouth at bedtime. 30 tablet 6  . Multiple Vitamins-Minerals (MULTIVITAMIN PO) Take 1 tablet by mouth at bedtime.     . pantoprazole (PROTONIX) 40 MG tablet  Take 1 tablet (40 mg total) by mouth 2 (two) times daily. (Patient taking differently: Take 40 mg by mouth every evening. ) 180 tablet 3  . potassium chloride (K-DUR) 10 MEQ tablet Take 10 mEq by mouth as needed (when taking furosemide).    . simvastatin (ZOCOR) 80 MG tablet Take 0.5 tablets (40 mg total) by mouth at bedtime. 45 tablet 3  . tiotropium (SPIRIVA) 18 MCG inhalation capsule Place 1 capsule (18 mcg total) into inhaler and inhale daily. 90 capsule 3  . vitamin B-12 (CYANOCOBALAMIN) 1000 MCG tablet Take 1,000 mcg by mouth at bedtime.    . vitamin C (ASCORBIC ACID) 500 MG tablet Take 1 tablet (500 mg total) by mouth 2 (two) times daily. 180 tablet 3  . warfarin (COUMADIN) 5 MG tablet Take 2.5 mg on Mon and Fri. All other days take 5 mg . On  6/21 take 2.5 mg. . (Patient taking differently: Take 2.5-5 mg by mouth daily at 6 PM. Takes 1/2 tab on Mon, Wed and Fri  Takes 1 tab all other days) 200 tablet 3  . amoxicillin (AMOXIL) 500 MG  tablet Take 2 gms thirty minutes - one hour prior to procedure (Patient not taking: Reported on 11/21/2015) 12 tablet 0  . furosemide (LASIX) 40 MG tablet Take 40 mg by mouth as needed for fluid. Reported on 11/21/2015    . hydrocortisone (ANUSOL-HC) 2.5 % rectal cream Place 1 application rectally 2 (two) times daily. 30 g 6   No current facility-administered medications for this encounter.    Review of patient's allergies indicates no known allergies.  REVIEW OF SYSTEMS: All systems negative except as listed in HPI, PMH and Problem list.   Filed Vitals:   11/21/15 1013 11/21/15 1024  BP: 114/85 120/0  Pulse: 62   Height: 6\' 1"  (1.854 m)   Weight: 235 lb 12.8 oz (106.958 kg)   SpO2: 98%   MAP 98  GENERAL: Well appearing, male; NAD; wife present. Ambulated in the clinic without difficulty.  HEENT: normal  NECK: Supple, JVP ~7 no bruits.  No lymphadenopathy or thyromegaly appreciated.   CARDIAC: Mechanical heart sounds with LVAD hum present.  LUNGS:   Clear to auscultation bilaterally.  ABDOMEN:  Obese, Soft, round, nontender, positive bowel sounds x4.     LVAD exit site: well-healed and incorporated.  Dressing dry and intact.  No erythema, drainage, odor or tenderness.  Stabilization device present and accurately applied.  Driveline dressing is being changed daily per sterile technique. Changed in clinic EXTREMITIES:  Warm and dry, no cyanosis, clubbing, no edema NEUROLOGIC:  Alert and oriented x 4.  Gait steady.  No aphasia.  No dysarthria.  Affect pleasant.     ASSESSMENT AND PLAN:   1. Chronic Systolic HF: s/p LVAD implant 12/2012 for DT.  Doing well. NYHA II. He is not volume overloaded. - Not on BB due to previous bradycardia.  - Continue losartan 75 mg at bed time.    - Reinforced the need and importance of daily weights, a low sodium diet, and fluid restriction (less than 2 L a day). Instructed to call the HF clinic if weight increases more than 3 lbs overnight or 5 lbs in a week.  2.  Anticoagulation management: INR goal 1.8-2.5 with history of GI bleeding.  He is not on ASA because of bleeding.  3. Anemia/ GIB:  Upper GI bleed in 09/21/14, had bleeding noted in jejunum requiring 3 clips.  Recent bleed thought to be due to epistaxis + possible hemorrhoidal bleeding.  We discussed banding the hemorrhoids.  He wants to think more about this. Hemoglobin rising.  4. LVAD: MAP elevated in 90s. - I will increase losartan to 100 mg daily with elevated MAP.  Will need BMET at followup.    - Check CBC, BMET, LDH and INR today.   5. PAF: Paroxysmal.  He is in NSR today, feels occasional atrial fibrillation. Continue amiodarone 400 mg daily for now, increased in hospital with recurrent atrial fibrillation.  Would consider decreasing to 200 mg daily again at next appointment. Follow LFTs and TSH.  Should have yearly eye exam.    6. Depression: Stable.  Continue Celexa 40 mg daily 7. OSA: Using CPAP.  8. Hypoxemia:  Uses home oxygen at night.   This may be due to his baseline COPD. 9. ICD infection - s/p extraction 6/16. No complications.   Loralie Champagne  11/21/2015

## 2015-11-21 NOTE — Patient Instructions (Addendum)
1.  Re-check INR in two weeks on 12/05/15. 2.  Increase Losartan to 100 mg daily. 3.  Anusol cream as needed for hemorrhoids. 4.  Return to [facility] clinic in one month.  Will be your 3 year follow up. Please bring your power module and Charity fundraiser for annual maintenance. Plan on being here for @ 2 hours. Will perform 6 minute walk.

## 2015-11-21 NOTE — Addendum Note (Signed)
Encounter addended by: Larey Dresser, MD on: 11/21/2015 11:38 PM<BR>     Documentation filed: Notes Section

## 2015-11-24 ENCOUNTER — Other Ambulatory Visit (HOSPITAL_BASED_OUTPATIENT_CLINIC_OR_DEPARTMENT_OTHER): Payer: Non-veteran care

## 2015-11-24 ENCOUNTER — Telehealth: Payer: Self-pay | Admitting: *Deleted

## 2015-11-24 ENCOUNTER — Ambulatory Visit: Payer: Non-veteran care

## 2015-11-24 ENCOUNTER — Ambulatory Visit (HOSPITAL_BASED_OUTPATIENT_CLINIC_OR_DEPARTMENT_OTHER): Payer: Non-veteran care | Admitting: Hematology

## 2015-11-24 ENCOUNTER — Encounter: Payer: Self-pay | Admitting: Hematology

## 2015-11-24 ENCOUNTER — Other Ambulatory Visit: Payer: Self-pay | Admitting: *Deleted

## 2015-11-24 ENCOUNTER — Telehealth: Payer: Self-pay

## 2015-11-24 ENCOUNTER — Telehealth: Payer: Self-pay | Admitting: Hematology

## 2015-11-24 VITALS — HR 69 | Temp 98.1°F | Resp 19 | Wt 233.8 lb

## 2015-11-24 DIAGNOSIS — D696 Thrombocytopenia, unspecified: Secondary | ICD-10-CM

## 2015-11-24 DIAGNOSIS — D509 Iron deficiency anemia, unspecified: Secondary | ICD-10-CM

## 2015-11-24 DIAGNOSIS — K922 Gastrointestinal hemorrhage, unspecified: Secondary | ICD-10-CM

## 2015-11-24 DIAGNOSIS — D500 Iron deficiency anemia secondary to blood loss (chronic): Secondary | ICD-10-CM | POA: Diagnosis not present

## 2015-11-24 DIAGNOSIS — D649 Anemia, unspecified: Secondary | ICD-10-CM

## 2015-11-24 DIAGNOSIS — D62 Acute posthemorrhagic anemia: Secondary | ICD-10-CM

## 2015-11-24 DIAGNOSIS — E039 Hypothyroidism, unspecified: Secondary | ICD-10-CM

## 2015-11-24 LAB — CBC & DIFF AND RETIC
BASO%: 0.4 % (ref 0.0–2.0)
BASOS ABS: 0 10*3/uL (ref 0.0–0.1)
EOS%: 2.5 % (ref 0.0–7.0)
Eosinophils Absolute: 0.2 10*3/uL (ref 0.0–0.5)
HEMATOCRIT: 40.6 % (ref 38.4–49.9)
HEMOGLOBIN: 12.3 g/dL — AB (ref 13.0–17.1)
IMMATURE RETIC FRACT: 10.2 % (ref 3.00–10.60)
LYMPH#: 0.8 10*3/uL — AB (ref 0.9–3.3)
LYMPH%: 10.5 % — AB (ref 14.0–49.0)
MCH: 30.1 pg (ref 27.2–33.4)
MCHC: 30.3 g/dL — ABNORMAL LOW (ref 32.0–36.0)
MCV: 99.5 fL — AB (ref 79.3–98.0)
MONO#: 0.4 10*3/uL (ref 0.1–0.9)
MONO%: 5.2 % (ref 0.0–14.0)
NEUT#: 6 10*3/uL (ref 1.5–6.5)
NEUT%: 81.4 % — AB (ref 39.0–75.0)
PLATELETS: 146 10*3/uL (ref 140–400)
RBC: 4.08 10*6/uL — ABNORMAL LOW (ref 4.20–5.82)
RDW: 19.3 % — ABNORMAL HIGH (ref 11.0–14.6)
RETIC CT ABS: 212.16 10*3/uL — AB (ref 34.80–93.90)
Retic %: 5.2 % — ABNORMAL HIGH (ref 0.80–1.80)
WBC: 7.3 10*3/uL (ref 4.0–10.3)

## 2015-11-24 LAB — IRON AND TIBC
%SAT: 20 % (ref 20–55)
Iron: 53 ug/dL (ref 42–163)
TIBC: 274 ug/dL (ref 202–409)
UIBC: 220 ug/dL (ref 117–376)

## 2015-11-24 LAB — FERRITIN: Ferritin: 308 ng/ml (ref 22–316)

## 2015-11-24 NOTE — Progress Notes (Signed)
[place]  [phone] [phone]  Clinic Follow Up Note   Patient Care Team: Thressa Sheller, MD as PCP - General (Internal Medicine) Jolaine Artist, MD as Attending Physician (Cardiology) Ivin Poot, MD as Attending Physician (Cardiothoracic Surgery) 11/24/2015  CHIEF COMPLAINTS:  Follow up anemia  HISTORY OF PRESENTING ILLNESS (11/01/2014):  [name] 75 y.o. male is here because of anemia.   He has extensive cardiac history. He had multiple heart attacks in the past 15 years, he had cardiac bypass surgery in 2003, PCI in 2007, long-standing history of , a atrial fibrillationnd was found to have ischemic cardiomyopathy with EF 27% in 2012.  He initially had AICD placed, and finally had LVAD placed in 2014 for his end-stage heart failure. He has been on Coumadin for more than 10 years for the cardiac issues.   He reports he has had anemia for several years, he used to take iron pill. He also received some IV iron a few years ago. In our electronic medical records, his lab showed anemia since 12/2012.   He had 3 episodes of GI bleeding since June of 2015 which are required blood transfusion, last episode was one month ago and he required 4u RBC  at that time. His EGD showed area of bleeding from duodenum all jejunum and he was treated endoscopyly.   He feels well overall. He denies significant chest pain or abdominal discomfort. He has mild dyspnea on moderate exertion. He has mild to moderate fatigue, but is able to function well at home,  does all his ADLs and daily activities.  TREATMENT: IV Feraheme 510 mg as needed, received in 10/2014, 01/2015, 03/2015, 06/2015, 08/2015, 10/2015, 11/2015, blood transfusion as needed, last received in 11/2015  INTERIM HISTORY [name] returns for follow-up. He was admitted to [facility] 2 weeks ago for heart failure and symptomatic anemia. He reported a few episodes of melena, and endoscopy was  unremarkable on 11/09/2015. He received 3 units of red blood cell, and one dose of ferriheme in the hospital. He will released from hospital 10 days ago. He feels pretty well now, mild fatigue and dyspnea on moderate exertion, no chest pain or other complaints.   MEDICAL HISTORY:  Past Medical History  Diagnosis Date  . Ischemic cardiomyopathy      CABG 2003, PCI 2007  EF 27%(myoview 2012  . Chronic systolic heart failure   . Hyperlipidemia   . Hypothyroidism   . Chronic anticoagulation     Afib and LVAD in 2014   . Obesity   . COPD (chronic obstructive pulmonary disease)   . Asbestosis(501)     "6 years in the [place]" (05/24/2013)  . Atrial fibrillation     permanent  . Paroxysmal ventricular tachycardia   . Coronary artery disease   . Implantable cardioverter-defibrillator [employer]   . Hypertension   . Myocardial infarction 1990's-2000    "2 in  ~ the 1990's; 1 in ~ 2000" (05/24/2013)  . OSA on CPAP   . Depression   . LVAD (left ventricular assist device) present 12/2012    SURGICAL HISTORY: Past Surgical History  Procedure Laterality Date  . Coronary artery bypass graft  2003    "?X4" (05/24/2013)  . Cardiac defibrillator placement  2004; ~ 2010    "cut it out in 2016 after it got infected"   . Insertion of implantable left ventricular assist device N/A 01/12/2013    Procedure: INSERTION OF IMPLANTABLE LEFT VENTRICULAR ASSIST  DEVICE;  Surgeon: Ivin Poot, MD;  Location: [place];  Service: Open Heart Surgery;  Laterality: N/A;   nitric oxide; Redo sternotomy  . Intraoperative transesophageal echocardiogram N/A 01/12/2013    Procedure: INTRAOPERATIVE TRANSESOPHAGEAL ECHOCARDIOGRAM;  Surgeon: Ivin Poot, MD;  Location: [place];  Service: Open Heart Surgery;  Laterality: N/A;  . Colonoscopy N/A 03/09/2014    Procedure: COLONOSCOPY;  Surgeon: Inda Castle, MD;  Location: [place];  Service: Endoscopy;  Laterality: N/A;  LVAD  patient  . Esophagogastroduodenoscopy N/A  03/09/2014    Procedure: ESOPHAGOGASTRODUODENOSCOPY (EGD);  Surgeon: Inda Castle, MD;  Location: [place];  Service: Endoscopy;  Laterality: N/A;  . Givens capsule study N/A 03/10/2014    Procedure: GIVENS CAPSULE STUDY;  Surgeon: Inda Castle, MD;  Location: [place];  Service: Endoscopy;  Laterality: N/A;  . Enteroscopy N/A 07/27/2014    Procedure: ENTEROSCOPY;  Surgeon: Gatha Mayer, MD;  Location: [place];  Service: Endoscopy;  Laterality: N/A;  LVAD patient   . Colonoscopy N/A 07/29/2014    Procedure: COLONOSCOPY;  Surgeon: Gatha Mayer, MD;  Location: [place];  Service: Endoscopy;  Laterality: N/A;  . Givens capsule study N/A 07/30/2014    Procedure: GIVENS CAPSULE STUDY;  Surgeon: Gatha Mayer, MD;  Location: [place];  Service: Endoscopy;  Laterality: N/A;  . Esophagogastroduodenoscopy N/A 08/01/2014    Procedure: ESOPHAGOGASTRODUODENOSCOPY (EGD);  Surgeon: Jerene Bears, MD;  [facility]COPY;  Service: Endoscopy;  Laterality: N/A;  LVAD patient  . Left and right heart catheterization with coronary/graft angiogram  01/07/2013    Procedure: LEFT AND RIGHT HEART CATHETERIZATION WITH Beatrix Fetters;  Surgeon: Jolaine Artist, MD;  Location[facility] CATH LAB;  Service: Cardiovascular;;  . Enteroscopy N/A 09/27/2014    Procedure: ENTEROSCOPY;  Surgeon: Gatha Mayer, MD;  Location[facility] ENDOSCOPY;  Service: Endoscopy;  Laterality: N/A;  . Icd lead removal Left 03/16/2015    Procedure: ICD LEAD REMOVAL/EXTRACTION;  Surgeon: Evans Lance, MD;  Location: [place];  Service: Cardiovascular;  Laterality: Left;  PATIENT HAS LVAD  DR. VAN TRIGT TO BACK UP EXTRACTION  . Appendectomy    . Enteroscopy N/A 11/09/2015    Procedure: ENTEROSCOPY;  Surgeon: Mauri Pole, MD;  Location: [facility] ENDOSCOPY;  Service: Endoscopy;  Laterality: N/A;  LVAD    SOCIAL HISTORY: Social History   Social History  . Marital Status: Married    Spouse Name: N/A  . Number  of Children: N/A  . Years of Education: N/A   Occupational History  . Not on file.   Social History Main Topics  . Smoking status: Former Smoker -- 2.00 packs/day for 45 years    Types: Cigarettes    Quit date: 11/11/2001  . Smokeless tobacco: Never Used  . Alcohol Use: Yes     Comment: 05/24/2013 "use to drink beer; hardly nothing since 2003; nothing at all since 12/2011 when I got my LVAD  "  . Drug Use: No  . Sexual Activity: No   Other Topics Concern  . Not on file   Social History Narrative    FAMILY HISTORY: Family History  Problem Relation Age of Onset  . Heart attack Mother   . Heart attack Father     ALLERGIES:  has No Known Allergies.  MEDICATIONS:  Current Outpatient Prescriptions  Medication Sig Dispense Refill  . alum & mag hydroxide-simeth (MAALOX/MYLANTA) 200-200-20 MG/5ML suspension Take 30 mLs by mouth every 6 (six) hours as  needed for indigestion or heartburn.    [name] amiodarone (PACERONE) 200 MG tablet Take 2 tablets (400 mg total) by mouth daily. 90 tablet 3  . amoxicillin (AMOXIL) 500 MG tablet Take 2 gms thirty minutes - one hour prior to procedure (Patient not taking: Reported on 11/21/2015) 12 tablet 0  . budesonide-formoterol (SYMBICORT) 160-4.5 MCG/ACT inhaler Inhale 2 puffs into the lungs 2 (two) times daily. 1 Inhaler 3  . cholecalciferol (VITAMIN D) 1000 UNITS tablet Take 1 tablet (1,000 Units total) by mouth 2 (two) times daily. 180 tablet 3  . citalopram (CELEXA) 40 MG tablet Take 1 tablet (40 mg total) by mouth at bedtime. 90 tablet 3  . docusate sodium (COLACE) 100 MG capsule Take 200 mg by mouth 2 (two) times daily.    . furosemide (LASIX) 40 MG tablet Take 40 mg by mouth as needed for fluid. Reported on 11/21/2015    . hydrocortisone (ANUSOL-HC) 2.5 % rectal cream Place 1 application rectally 2 (two) times daily. 30 g 6  . levothyroxine (SYNTHROID, LEVOTHROID) 125 MCG tablet Take 125 mcg by mouth daily before breakfast.    . losartan (COZAAR)  100 MG tablet Take 1 tablet (100 mg total) by mouth at bedtime. 30 tablet 6  . Multiple Vitamins-Minerals (MULTIVITAMIN PO) Take 1 tablet by mouth at bedtime.     . pantoprazole (PROTONIX) 40 MG tablet Take 1 tablet (40 mg total) by mouth 2 (two) times daily. (Patient taking differently: Take 40 mg by mouth every evening. ) 180 tablet 3  . potassium chloride (K-DUR) 10 MEQ tablet Take 10 mEq by mouth as needed (when taking furosemide).    . simvastatin (ZOCOR) 80 MG tablet Take 0.5 tablets (40 mg total) by mouth at bedtime. 45 tablet 3  . tiotropium (SPIRIVA) 18 MCG inhalation capsule Place 1 capsule (18 mcg total) into inhaler and inhale daily. 90 capsule 3  . vitamin B-12 (CYANOCOBALAMIN) 1000 MCG tablet Take 1,000 mcg by mouth at bedtime.    . vitamin C (ASCORBIC ACID) 500 MG tablet Take 1 tablet (500 mg total) by mouth 2 (two) times daily. 180 tablet 3  . warfarin (COUMADIN) 5 MG tablet Take 2.5 mg on Mon and Fri. All other days take 5 mg . On  6/21 take 2.5 mg. . (Patient taking differently: Take 2.5-5 mg by mouth daily at 6 PM. Takes 1/2 tab on Mon, Wed and Fri  Takes 1 tab all other days) 200 tablet 3   No current facility-administered medications for this visit.    REVIEW OF SYSTEMS:   Constitutional: Denies fevers, chills or abnormal night sweats Eyes: Denies blurriness of vision, double vision or watery eyes Ears, nose, mouth, throat, and face: Denies mucositis or sore throat Respiratory: Denies cough, dyspnea or wheezes Cardiovascular: Denies palpitation, chest discomfort or lower extremity swelling Gastrointestinal:  Denies nausea, heartburn or change in bowel habits Skin: Denies abnormal skin rashes Lymphatics: Denies new lymphadenopathy or easy bruising Neurological:Denies numbness, tingling or new weaknesses Behavioral/Psych: Mood is stable, no new changes  All other systems were reviewed with the patient and are negative.  PHYSICAL EXAMINATION: ECOG PERFORMANCE STATUS: 1 -  Symptomatic but completely ambulatory  Filed Vitals:   11/24/15 0843  Pulse: 69  Temp: 98.1 F (36.7 C)  Resp: 19   Filed Weights   11/24/15 0843  Weight: 233 lb 12.8 oz (106.051 kg)    GENERAL:alert, no distress and comfortable SKIN: skin color, texture, turgor are normal, no rashes or significant lesions  EYES: normal, conjunctiva are pink and non-injected, sclera clear OROPHARYNX:no exudate, no erythema and lips, buccal mucosa, and tongue normal  NECK: supple, thyroid normal size, non-tender, without nodularity LYMPH:  no palpable lymphadenopathy in the cervical, axillary or inguinal LUNGS: clear to auscultation and percussion with normal breathing effort HEART: (+) LVAD blood flow   ABDOMEN:abdomen soft, non-tender and normal bowel sounds Musculoskeletal:no cyanosis of digits and no clubbing  PSYCH: alert & oriented x 3 with fluent speech NEURO: no focal motor/sensory deficits  LABORATORY DATA:  I have reviewed the data as listed CBC Latest Ref Rng 11/21/2015 11/14/2015 11/13/2015  WBC 4.0 - 10.5 K/uL 6.3 5.5 6.5  Hemoglobin 13.0 - 17.0 g/dL 11.2(L) 8.8(L) 8.8(L)  Hematocrit 39.0 - 52.0 % 39.3 31.3(L) 31.1(L)  Platelets 150 - 400 K/uL 146(L) 152 161    CMP Latest Ref Rng 11/21/2015 11/13/2015 11/12/2015  Glucose 65 - 99 mg/dL 113(H) 91 134(H)  BUN 6 - 20 mg/dL 13 16 13   Creatinine 0.61 - 1.24 mg/dL 1.02 0.87 0.89  Sodium 135 - 145 mmol/L 141 142 142  Potassium 3.5 - 5.1 mmol/L 4.2 3.8 3.8  Chloride 101 - 111 mmol/L 109 107 109  CO2 22 - 32 mmol/L 26 23 23   Calcium 8.9 - 10.3 mg/dL 9.9 9.6 9.7    Results for MARQUEL, DENT (MRN [id number]) as of 11/24/2015 07:20  Ref. Range 09/05/2015 10:11 10/11/2015 09:15 11/07/2015 10:44  Iron Latest Ref Range: 45-182 ug/dL 48 36 (L) 25 (L)  UIBC Latest Units: ug/dL 271 271 321  TIBC Latest Ref Range: 250-450 ug/dL 319 307 346  Saturation Ratios Latest Ref Range: 17.9-39.5 % 15 (L) 12 (L) 7 (L)  Ferritin Latest Ref Range: 24-336 ng/mL  47 49 31       RADIOGRAPHIC STUDIES: I have personally reviewed the radiological images as listed and agreed with the findings in the report. No results found.  ASSESSMENT & PLAN:  75 year old Caucasian male, with extensive cardiac history, including CAD, ischemic cardiomyopathy status post LVAD, on long-term Coumadin, presented with multiple episodes of GI bleeding and anemia.  1. 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 -His iron studies showed low ferritin level and serum iron, consistent with iron deficient anemia. This is secondary to his GI bleeding. -He responded to IV for you very well. 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  -Due to the recent episode of GI bleeding and anemia, we'll proceed to second dose of ferriheme today -Close monitoring his CBC every 2 weeks and iron study every months at his cardiologist's office  -He will also follow up with [facility]   2. Mild thrombocytopenia -Secondary to LVAD -Plt count has been stable   3. He will continue to follow-up with his primary care physician and cardiologist for his HTN, heart disease, hypothyroidism, OSA and mild liver cirrhosis..  Plan -Repeat CBC every 2 weeks and iron study monthly at [facility] cardiology clinic, my nurse contact them regarding the lab orders  -IV Feraheme  510mg  X1 today, and will set up feraheme infusion if ferritin less than 100, his wife will call us when she receives lab result from cardiology clinic  -Return to clinic in 2 months  All questions were answered. The patient knows to call the clinic with any problems, questions or concerns.  I spent 20  minutes counseling the patient face to face. The total time spent in the appointment was 25 minutes and more than 50% was on counseling.     Truitt Merle, MD 11/24/2015

## 2015-11-24 NOTE — Telephone Encounter (Signed)
per pf to sch pt appt-gave pt copy of avs-sent MW email to sch trmt-pt aware

## 2015-11-24 NOTE — Telephone Encounter (Signed)
Per staff message and POF I have scheduled appts. Advised scheduler of appts. JMW  

## 2015-11-24 NOTE — Telephone Encounter (Signed)
-----   Message from Oda Kilts, [place] sent at 11/24/2015  3:23 PM EST ----- Regarding: RE: Needs a follow up  [facility] send to [facility], Per Dr Silverio Decamp she thought this was a [facility] patient. Per Carla Drape may be [employer].... Patient needs a follow up with Dr Carlean Purl . But ill let you look at it first  ----- Message -----    From: Larina Bras, CMA    Sent: 11/24/2015  10:00 AM      To: Oda Kilts, CMA Subject: FW: Needs a follow up                          Looks like a [facility] patient to me....  ----- Message -----    From: Oda Kilts, CMA    Sent: 11/24/2015   9:53 AM      To: Larina Bras, CMA Subject: Needs a follow up                              Per Dr Silverio Decamp she said this patient seen Dr Hilarie Fredrickson first in the hospital and should be scheduled with him.[name] He was on our banding schedule but [facility] does bandings.... Thanks [employer]

## 2015-11-24 NOTE — Telephone Encounter (Signed)
Please advise how soon you wish for me to work [name] in for f/u visit Sir.  Thank you.

## 2015-11-27 NOTE — Telephone Encounter (Signed)
Patient reports having bleeding twice a week only with bowel movements.  Using his hydrocortisone cream.  Please advise?

## 2015-11-27 NOTE — Telephone Encounter (Signed)
I think he could see [place] as needed... Unless he is having bleeding would do that

## 2015-11-28 ENCOUNTER — Telehealth (HOSPITAL_COMMUNITY): Payer: Self-pay | Admitting: Infectious Diseases

## 2015-11-28 NOTE — Telephone Encounter (Signed)
We can see him in March please

## 2015-11-28 NOTE — Telephone Encounter (Signed)
Left message for him to call me back to get him an appointment, we could work him in either 12/25/15 or 12/27/15.

## 2015-11-28 NOTE — Telephone Encounter (Signed)
Called to set up appointments for lab work requested by Dr. Burr Medico for heme management.   12/05/15 - INR, CBC w/ D, Ferritin  12/20/15 - INR & CBC  01/01/16 - INR, CBC w/ D, & Ferritin  4/17 - CBC & INR  Asking about the Octreotide shots--with inconclusive results from push enteroscopy and likely source of bleeding from  combined epistaxis/hemrrhoids did not think this was part of the plan of care currently--He is going to FU with GI for appointment for possible banding (of which they are not likely going to consider).   Appointment with VAD team 3/22 for FU and we will be closely monitoring blood work until then. Can discuss possibility if evidence of ongoing chronic blood loss at that point. Verbalized understanding of the above.   Janene Madeira, RN VAD Coordinator   Office: [phone] 24/7 VAD Pager: [phone]

## 2015-12-04 ENCOUNTER — Other Ambulatory Visit (HOSPITAL_COMMUNITY): Payer: Self-pay | Admitting: *Deleted

## 2015-12-04 DIAGNOSIS — Z95811 Presence of heart assist device: Secondary | ICD-10-CM

## 2015-12-04 DIAGNOSIS — E611 Iron deficiency: Secondary | ICD-10-CM

## 2015-12-04 DIAGNOSIS — Z7901 Long term (current) use of anticoagulants: Secondary | ICD-10-CM

## 2015-12-05 ENCOUNTER — Ambulatory Visit (HOSPITAL_BASED_OUTPATIENT_CLINIC_OR_DEPARTMENT_OTHER)
Admission: RE | Admit: 2015-12-05 | Discharge: 2015-12-05 | Disposition: A | Discharge status: Home / Self Care | Payer: No Typology Code available for payment source | Source: Ambulatory Visit | Attending: Cardiology | Admitting: Cardiology

## 2015-12-05 ENCOUNTER — Ambulatory Visit (HOSPITAL_COMMUNITY): Payer: Self-pay | Admitting: *Deleted

## 2015-12-05 DIAGNOSIS — D696 Thrombocytopenia, unspecified: Secondary | ICD-10-CM | POA: Diagnosis not present

## 2015-12-05 DIAGNOSIS — Z95811 Presence of heart assist device: Secondary | ICD-10-CM

## 2015-12-05 DIAGNOSIS — I110 Hypertensive heart disease with heart failure: Secondary | ICD-10-CM | POA: Diagnosis present

## 2015-12-05 DIAGNOSIS — I472 Ventricular tachycardia: Secondary | ICD-10-CM | POA: Diagnosis not present

## 2015-12-05 DIAGNOSIS — K921 Melena: Principal | ICD-10-CM | POA: Diagnosis present

## 2015-12-05 DIAGNOSIS — I252 Old myocardial infarction: Secondary | ICD-10-CM

## 2015-12-05 DIAGNOSIS — Z7901 Long term (current) use of anticoagulants: Secondary | ICD-10-CM

## 2015-12-05 DIAGNOSIS — I5023 Acute on chronic systolic (congestive) heart failure: Secondary | ICD-10-CM

## 2015-12-05 DIAGNOSIS — R5383 Other fatigue: Secondary | ICD-10-CM | POA: Diagnosis not present

## 2015-12-05 DIAGNOSIS — E785 Hyperlipidemia, unspecified: Secondary | ICD-10-CM | POA: Diagnosis present

## 2015-12-05 DIAGNOSIS — Z95810 Presence of automatic (implantable) cardiac defibrillator: Secondary | ICD-10-CM

## 2015-12-05 DIAGNOSIS — D500 Iron deficiency anemia secondary to blood loss (chronic): Secondary | ICD-10-CM | POA: Diagnosis present

## 2015-12-05 DIAGNOSIS — J449 Chronic obstructive pulmonary disease, unspecified: Secondary | ICD-10-CM | POA: Diagnosis present

## 2015-12-05 DIAGNOSIS — E611 Iron deficiency: Secondary | ICD-10-CM

## 2015-12-05 DIAGNOSIS — Z5181 Encounter for therapeutic drug level monitoring: Secondary | ICD-10-CM | POA: Insufficient documentation

## 2015-12-05 DIAGNOSIS — J61 Pneumoconiosis due to asbestos and other mineral fibers: Secondary | ICD-10-CM | POA: Diagnosis present

## 2015-12-05 DIAGNOSIS — Z87891 Personal history of nicotine dependence: Secondary | ICD-10-CM

## 2015-12-05 DIAGNOSIS — G4733 Obstructive sleep apnea (adult) (pediatric): Secondary | ICD-10-CM | POA: Diagnosis present

## 2015-12-05 DIAGNOSIS — I4892 Unspecified atrial flutter: Secondary | ICD-10-CM | POA: Diagnosis present

## 2015-12-05 DIAGNOSIS — I255 Ischemic cardiomyopathy: Secondary | ICD-10-CM | POA: Diagnosis present

## 2015-12-05 DIAGNOSIS — I5022 Chronic systolic (congestive) heart failure: Secondary | ICD-10-CM | POA: Diagnosis present

## 2015-12-05 DIAGNOSIS — Z79899 Other long term (current) drug therapy: Secondary | ICD-10-CM

## 2015-12-05 DIAGNOSIS — K7460 Unspecified cirrhosis of liver: Secondary | ICD-10-CM | POA: Diagnosis present

## 2015-12-05 DIAGNOSIS — F329 Major depressive disorder, single episode, unspecified: Secondary | ICD-10-CM | POA: Diagnosis present

## 2015-12-05 DIAGNOSIS — I480 Paroxysmal atrial fibrillation: Secondary | ICD-10-CM | POA: Diagnosis present

## 2015-12-05 DIAGNOSIS — I2510 Atherosclerotic heart disease of native coronary artery without angina pectoris: Secondary | ICD-10-CM | POA: Diagnosis present

## 2015-12-05 DIAGNOSIS — Z85828 Personal history of other malignant neoplasm of skin: Secondary | ICD-10-CM

## 2015-12-05 DIAGNOSIS — E669 Obesity, unspecified: Secondary | ICD-10-CM | POA: Diagnosis present

## 2015-12-05 DIAGNOSIS — Z951 Presence of aortocoronary bypass graft: Secondary | ICD-10-CM

## 2015-12-05 DIAGNOSIS — E039 Hypothyroidism, unspecified: Secondary | ICD-10-CM | POA: Diagnosis present

## 2015-12-05 DIAGNOSIS — Z6829 Body mass index (BMI) 29.0-29.9, adult: Secondary | ICD-10-CM

## 2015-12-05 DIAGNOSIS — Z7951 Long term (current) use of inhaled steroids: Secondary | ICD-10-CM

## 2015-12-05 LAB — PROTIME-INR
INR: 2.22 — AB (ref 0.00–1.49)
Prothrombin Time: 24.4 seconds — ABNORMAL HIGH (ref 11.6–15.2)

## 2015-12-05 LAB — CBC WITH DIFFERENTIAL/PLATELET
BASOS ABS: 0 10*3/uL (ref 0.0–0.1)
Basophils Relative: 0 %
Eosinophils Absolute: 0.1 10*3/uL (ref 0.0–0.7)
Eosinophils Relative: 2 %
HCT: 37.3 % — ABNORMAL LOW (ref 39.0–52.0)
HEMOGLOBIN: 11.1 g/dL — AB (ref 13.0–17.0)
LYMPHS ABS: 0.6 10*3/uL — AB (ref 0.7–4.0)
LYMPHS PCT: 10 %
MCH: 29.7 pg (ref 26.0–34.0)
MCHC: 29.8 g/dL — ABNORMAL LOW (ref 30.0–36.0)
MCV: 99.7 fL (ref 78.0–100.0)
Monocytes Absolute: 0.3 10*3/uL (ref 0.1–1.0)
Monocytes Relative: 5 %
NEUTROS ABS: 5.6 10*3/uL (ref 1.7–7.7)
NEUTROS PCT: 83 %
PLATELETS: 140 10*3/uL — AB (ref 150–400)
RBC: 3.74 MIL/uL — AB (ref 4.22–5.81)
RDW: 19.3 % — ABNORMAL HIGH (ref 11.5–15.5)
WBC: 6.7 10*3/uL (ref 4.0–10.5)

## 2015-12-05 LAB — FERRITIN: Ferritin: 75 ng/mL (ref 24–336)

## 2015-12-07 ENCOUNTER — Other Ambulatory Visit: Payer: Non-veteran care

## 2015-12-07 ENCOUNTER — Telehealth: Payer: Self-pay | Admitting: Internal Medicine

## 2015-12-07 ENCOUNTER — Ambulatory Visit: Payer: Non-veteran care | Admitting: Hematology

## 2015-12-07 ENCOUNTER — Encounter: Payer: Self-pay | Admitting: Hematology

## 2015-12-07 NOTE — Telephone Encounter (Signed)
Per wife patient had defib device removed last year and does not need to fu with Dr. Caryl Comes.  Will delete recall.

## 2015-12-07 NOTE — Telephone Encounter (Signed)
Dr. Caryl Comes- was there any reason you needed to see him back?

## 2015-12-08 ENCOUNTER — Inpatient Hospital Stay (HOSPITAL_COMMUNITY)
Admission: EM | Admit: 2015-12-08 | Discharge: 2015-12-14 | DRG: 378 | Disposition: A | Discharge status: Home / Self Care | Payer: No Typology Code available for payment source | Attending: Internal Medicine | Admitting: Internal Medicine

## 2015-12-08 ENCOUNTER — Encounter (HOSPITAL_COMMUNITY): Payer: Self-pay | Admitting: Vascular Surgery

## 2015-12-08 ENCOUNTER — Emergency Department (HOSPITAL_COMMUNITY): Payer: No Typology Code available for payment source

## 2015-12-08 DIAGNOSIS — Z7901 Long term (current) use of anticoagulants: Secondary | ICD-10-CM | POA: Diagnosis not present

## 2015-12-08 DIAGNOSIS — R531 Weakness: Secondary | ICD-10-CM | POA: Diagnosis not present

## 2015-12-08 DIAGNOSIS — D649 Anemia, unspecified: Secondary | ICD-10-CM | POA: Diagnosis not present

## 2015-12-08 DIAGNOSIS — D500 Iron deficiency anemia secondary to blood loss (chronic): Secondary | ICD-10-CM | POA: Diagnosis present

## 2015-12-08 DIAGNOSIS — Z95811 Presence of heart assist device: Secondary | ICD-10-CM | POA: Diagnosis not present

## 2015-12-08 DIAGNOSIS — J449 Chronic obstructive pulmonary disease, unspecified: Secondary | ICD-10-CM | POA: Diagnosis present

## 2015-12-08 DIAGNOSIS — I4891 Unspecified atrial fibrillation: Secondary | ICD-10-CM

## 2015-12-08 DIAGNOSIS — J61 Pneumoconiosis due to asbestos and other mineral fibers: Secondary | ICD-10-CM | POA: Diagnosis present

## 2015-12-08 DIAGNOSIS — Z6829 Body mass index (BMI) 29.0-29.9, adult: Secondary | ICD-10-CM | POA: Diagnosis not present

## 2015-12-08 DIAGNOSIS — I5022 Chronic systolic (congestive) heart failure: Secondary | ICD-10-CM | POA: Diagnosis present

## 2015-12-08 DIAGNOSIS — I255 Ischemic cardiomyopathy: Secondary | ICD-10-CM | POA: Diagnosis present

## 2015-12-08 DIAGNOSIS — I480 Paroxysmal atrial fibrillation: Secondary | ICD-10-CM

## 2015-12-08 DIAGNOSIS — G4733 Obstructive sleep apnea (adult) (pediatric): Secondary | ICD-10-CM | POA: Diagnosis present

## 2015-12-08 DIAGNOSIS — E785 Hyperlipidemia, unspecified: Secondary | ICD-10-CM | POA: Diagnosis present

## 2015-12-08 DIAGNOSIS — K921 Melena: Secondary | ICD-10-CM | POA: Diagnosis present

## 2015-12-08 DIAGNOSIS — R5383 Other fatigue: Secondary | ICD-10-CM

## 2015-12-08 DIAGNOSIS — K922 Gastrointestinal hemorrhage, unspecified: Secondary | ICD-10-CM | POA: Diagnosis present

## 2015-12-08 DIAGNOSIS — Z951 Presence of aortocoronary bypass graft: Secondary | ICD-10-CM | POA: Diagnosis not present

## 2015-12-08 DIAGNOSIS — I4892 Unspecified atrial flutter: Secondary | ICD-10-CM | POA: Diagnosis present

## 2015-12-08 DIAGNOSIS — I10 Essential (primary) hypertension: Secondary | ICD-10-CM

## 2015-12-08 DIAGNOSIS — E669 Obesity, unspecified: Secondary | ICD-10-CM | POA: Diagnosis present

## 2015-12-08 DIAGNOSIS — I5020 Unspecified systolic (congestive) heart failure: Secondary | ICD-10-CM

## 2015-12-08 DIAGNOSIS — I472 Ventricular tachycardia: Secondary | ICD-10-CM | POA: Diagnosis not present

## 2015-12-08 DIAGNOSIS — Z95810 Presence of automatic (implantable) cardiac defibrillator: Secondary | ICD-10-CM | POA: Diagnosis not present

## 2015-12-08 DIAGNOSIS — I2510 Atherosclerotic heart disease of native coronary artery without angina pectoris: Secondary | ICD-10-CM | POA: Diagnosis present

## 2015-12-08 DIAGNOSIS — F329 Major depressive disorder, single episode, unspecified: Secondary | ICD-10-CM | POA: Diagnosis present

## 2015-12-08 DIAGNOSIS — Z85828 Personal history of other malignant neoplasm of skin: Secondary | ICD-10-CM | POA: Diagnosis not present

## 2015-12-08 DIAGNOSIS — I110 Hypertensive heart disease with heart failure: Secondary | ICD-10-CM | POA: Diagnosis present

## 2015-12-08 DIAGNOSIS — I252 Old myocardial infarction: Secondary | ICD-10-CM | POA: Diagnosis not present

## 2015-12-08 DIAGNOSIS — D696 Thrombocytopenia, unspecified: Secondary | ICD-10-CM | POA: Diagnosis not present

## 2015-12-08 DIAGNOSIS — I483 Typical atrial flutter: Secondary | ICD-10-CM | POA: Diagnosis not present

## 2015-12-08 DIAGNOSIS — K7460 Unspecified cirrhosis of liver: Secondary | ICD-10-CM | POA: Diagnosis present

## 2015-12-08 DIAGNOSIS — E039 Hypothyroidism, unspecified: Secondary | ICD-10-CM | POA: Diagnosis present

## 2015-12-08 DIAGNOSIS — Z7951 Long term (current) use of inhaled steroids: Secondary | ICD-10-CM | POA: Diagnosis not present

## 2015-12-08 DIAGNOSIS — Z79899 Other long term (current) drug therapy: Secondary | ICD-10-CM | POA: Diagnosis not present

## 2015-12-08 DIAGNOSIS — Z87891 Personal history of nicotine dependence: Secondary | ICD-10-CM | POA: Diagnosis not present

## 2015-12-08 LAB — BASIC METABOLIC PANEL
ANION GAP: 11 (ref 5–15)
BUN: 29 mg/dL — ABNORMAL HIGH (ref 6–20)
CALCIUM: 10 mg/dL (ref 8.9–10.3)
CHLORIDE: 105 mmol/L (ref 101–111)
CO2: 23 mmol/L (ref 22–32)
CREATININE: 1.04 mg/dL (ref 0.61–1.24)
GFR calc non Af Amer: >60 mL/min (ref >60)
Glucose, Bld: 129 mg/dL — ABNORMAL HIGH (ref 65–99)
Potassium: 4.3 mmol/L (ref 3.5–5.1)
SODIUM: 139 mmol/L (ref 135–145)

## 2015-12-08 LAB — CBC
HCT: 32.5 % — ABNORMAL LOW (ref 39.0–52.0)
HEMOGLOBIN: 9.7 g/dL — AB (ref 13.0–17.0)
MCH: 30 pg (ref 26.0–34.0)
MCHC: 29.8 g/dL — AB (ref 30.0–36.0)
MCV: 100.6 fL — ABNORMAL HIGH (ref 78.0–100.0)
Platelets: 166 10*3/uL (ref 150–400)
RBC: 3.23 MIL/uL — ABNORMAL LOW (ref 4.22–5.81)
RDW: 20.4 % — AB (ref 11.5–15.5)
WBC: 9 10*3/uL (ref 4.0–10.5)

## 2015-12-08 LAB — LACTATE DEHYDROGENASE: LDH: 316 U/L — AB (ref 98–192)

## 2015-12-08 LAB — I-STAT TROPONIN, ED: TROPONIN I, POC: 0.02 ng/mL (ref 0.00–0.08)

## 2015-12-08 LAB — PROTIME-INR
INR: 2.01 — ABNORMAL HIGH (ref 0.00–1.49)
PROTHROMBIN TIME: 22.7 s — AB (ref 11.6–15.2)

## 2015-12-08 MED ORDER — MOMETASONE FURO-FORMOTEROL FUM 200-5 MCG/ACT IN AERO
2.0000 | INHALATION_SPRAY | Freq: Two times a day (BID) | RESPIRATORY_TRACT | Status: DC
  Administered 2015-12-09 – 2015-12-14 (×9): 2 via RESPIRATORY_TRACT
  Filled 2015-12-08: qty 8.8

## 2015-12-08 MED ORDER — AMIODARONE HCL IN DEXTROSE 360-4.14 MG/200ML-% IV SOLN
60.0000 mg/h | INTRAVENOUS | Status: DC
  Administered 2015-12-08 (×2): 60 mg/h via INTRAVENOUS
  Filled 2015-12-08 (×2): qty 200

## 2015-12-08 MED ORDER — NITROGLYCERIN 0.4 MG SL SUBL: 0.4000 mg | SUBLINGUAL_TABLET | SUBLINGUAL | Status: DC | PRN

## 2015-12-08 MED ORDER — HYDROCORTISONE 2.5 % RE CREA
1.0000 "application " | TOPICAL_CREAM | Freq: Two times a day (BID) | RECTAL | Status: DC
  Administered 2015-12-12 – 2015-12-14 (×4): 1 via RECTAL
  Filled 2015-12-08 (×2): qty 28.35

## 2015-12-08 MED ORDER — AMIODARONE HCL IN DEXTROSE 360-4.14 MG/200ML-% IV SOLN
30.0000 mg/h | INTRAVENOUS | Status: DC
  Administered 2015-12-09 – 2015-12-10 (×4): 30 mg/h via INTRAVENOUS
  Filled 2015-12-08 (×5): qty 200

## 2015-12-08 MED ORDER — VITAMIN D 1000 UNITS PO TABS
1000.0000 [IU] | ORAL_TABLET | Freq: Two times a day (BID) | ORAL | Status: DC
  Administered 2015-12-08 – 2015-12-14 (×12): 1000 [IU] via ORAL
  Filled 2015-12-08 (×12): qty 1

## 2015-12-08 MED ORDER — VITAMIN B-12 1000 MCG PO TABS
1000.0000 ug | ORAL_TABLET | Freq: Every day | ORAL | Status: DC
  Administered 2015-12-08 – 2015-12-13 (×6): 1000 ug via ORAL
  Filled 2015-12-08 (×6): qty 1

## 2015-12-08 MED ORDER — CITALOPRAM HYDROBROMIDE 20 MG PO TABS: 40.0000 mg | ORAL_TABLET | Freq: Every day | ORAL | Status: DC

## 2015-12-08 MED ORDER — IPRATROPIUM-ALBUTEROL 0.5-2.5 (3) MG/3ML IN SOLN: 3.0000 mL | Freq: Four times a day (QID) | RESPIRATORY_TRACT | Status: DC | PRN

## 2015-12-08 MED ORDER — AMIODARONE LOAD VIA INFUSION
150.0000 mg | Freq: Once | INTRAVENOUS | Status: AC
  Administered 2015-12-08: 150 mg via INTRAVENOUS
  Filled 2015-12-08: qty 83.34

## 2015-12-08 MED ORDER — WARFARIN - PHARMACIST DOSING INPATIENT
Freq: Every day | Status: DC
  Administered 2015-12-10 – 2015-12-11 (×2)

## 2015-12-08 MED ORDER — VITAMIN C 500 MG PO TABS
500.0000 mg | ORAL_TABLET | Freq: Two times a day (BID) | ORAL | Status: DC
  Administered 2015-12-08 – 2015-12-14 (×12): 500 mg via ORAL
  Filled 2015-12-08 (×12): qty 1

## 2015-12-08 MED ORDER — ATORVASTATIN CALCIUM 40 MG PO TABS
40.0000 mg | ORAL_TABLET | Freq: Every day | ORAL | Status: DC
  Administered 2015-12-08 – 2015-12-13 (×6): 40 mg via ORAL
  Filled 2015-12-08 (×6): qty 1

## 2015-12-08 MED ORDER — FUROSEMIDE 40 MG PO TABS: 40.0000 mg | ORAL_TABLET | ORAL | Status: DC | PRN

## 2015-12-08 MED ORDER — DOCUSATE SODIUM 100 MG PO CAPS
200.0000 mg | ORAL_CAPSULE | Freq: Two times a day (BID) | ORAL | Status: DC
  Administered 2015-12-08 – 2015-12-14 (×12): 200 mg via ORAL
  Filled 2015-12-08 (×13): qty 2

## 2015-12-08 MED ORDER — PANTOPRAZOLE SODIUM 40 MG PO TBEC
40.0000 mg | DELAYED_RELEASE_TABLET | Freq: Two times a day (BID) | ORAL | Status: DC
  Administered 2015-12-08 – 2015-12-14 (×12): 40 mg via ORAL
  Filled 2015-12-08 (×12): qty 1

## 2015-12-08 MED ORDER — TIOTROPIUM BROMIDE MONOHYDRATE 18 MCG IN CAPS
18.0000 ug | ORAL_CAPSULE | Freq: Every day | RESPIRATORY_TRACT | Status: DC
  Administered 2015-12-09 – 2015-12-13 (×5): 18 ug via RESPIRATORY_TRACT
  Filled 2015-12-08 (×2): qty 5

## 2015-12-08 MED ORDER — WARFARIN SODIUM 2.5 MG PO TABS
2.5000 mg | ORAL_TABLET | ORAL | Status: AC
  Administered 2015-12-08: 2.5 mg via ORAL
  Filled 2015-12-08: qty 1

## 2015-12-08 MED ORDER — LEVOTHYROXINE SODIUM 100 MCG PO TABS: 0.5000 ug | ORAL_TABLET | Freq: Every day | ORAL | Status: DC

## 2015-12-08 MED ORDER — POTASSIUM CHLORIDE ER 10 MEQ PO TBCR
10.0000 meq | EXTENDED_RELEASE_TABLET | ORAL | Status: DC | PRN
Start: 2015-12-08 — End: 2015-12-14
  Filled 2015-12-08: qty 1

## 2015-12-08 MED ORDER — ADULT MULTIVITAMIN LIQUID CH
15.0000 mL | Freq: Every day | ORAL | Status: DC
  Administered 2015-12-09 – 2015-12-12 (×2): 15 mL via ORAL
  Filled 2015-12-08 (×4): qty 15

## 2015-12-08 NOTE — ED Notes (Addendum)
Patient transported to X-ray 

## 2015-12-08 NOTE — ED Notes (Signed)
Had a skin cancer taken off on Tuesday, felt nauseated after, continued to feel nauseated and weka since-- missed CPap the first night after surgery, but used cpap in the past two nights.
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
## 2015-12-08 NOTE — H&P (Signed)
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 Quit smoking 2003. Uses CPAP and O2 every night. He is s/p LVAD HM II implanted 01/12/13 under DT criteria.   Over the last few years he has had multiple admits for GI bleed. Enteroscopy 12/15 showed bleed from jejunum requiring 3 clips.  Admitted 2/17 with recurrent GIB and symptomatic anemia. Enteroscopy 11/09/15 unremarkable. On admit he was in A fib that historically he tolerates poorly so amio was increased. He converted to NSR but continue to have short bouts of AF.   Presents to ER today with several days of weakness and nausea. Has not had vomitting, fevers, chills or diarrhea. Just doesn't have much energy. Weak when he stands up. Denies CP, SOB, palpitation, melena, BRBPR.   In ER found to be in AFL with slow VR. Also HGB 11.1-> 9.7   LVAD INTERROGATION:  HeartMate II LVAD:  Flow 7.4  liters/min, speed 9200, power 6.6 , PI 5.2 with occasional PI events. .     Review of Systems: [y] = yes, [ ]  = no   General: Weight gain [ ] ; Weight loss [ ] ; Anorexia [ y]; Fatigue [Y ]; Fever [ ] ; Chills [ ] ; Weakness [Y ]  Cardiac: Chest pain/pressure [ ] ; Resting SOB [ ] ; Exertional SOB [ ] ; Orthopnea [ ] ; Pedal Edema [ ] ; Palpitations [ ] ; Syncope [ ] ; Presyncope [ ] ; Paroxysmal nocturnal dyspnea[ ]   Pulmonary: Cough [ ] ; Wheezing[ ] ; Hemoptysis[ ] ; Sputum [ ] ; Snoring [ ]   GI: Vomiting[ ] ; Dysphagia[ ] ; Melena[ ] ; Hematochezia [ ] ; Heartburn[ ] ; Abdominal pain [ y]; Constipation [ ] ; Diarrhea [ ] ; BRBPR [ ]   GU: Hematuria[ ] ; Dysuria [ ] ; Nocturia[ ]   Vascular: Pain in legs with walking [ ] ; Pain in feet with lying flat [ ] ; Non-healing sores [ ] ; Stroke [ ] ; TIA [ ] ; Slurred speech [ ] ;  Neuro: Headaches[ ] ; Vertigo[ ] ;  Seizures[ ] ; Paresthesias[ ] ;Blurred vision [ ] ; Diplopia [ ] ; Vision changes [ ]   Ortho/Skin: Arthritis [ ] ; Joint pain [Y ]; Muscle pain [ ] ; Joint swelling [ ] ; Back Pain [ ] ; Rash [ ]   Psych: Depression[ ] ; Anxiety[ ]   Heme: Bleeding problems [ ] ; Clotting disorders [ ] ; Anemia Blue.Reese ]  Endocrine: Diabetes [ ] ; Thyroid dysfunction[ ]   Home Medications Prior to Admission medications   Medication Sig Start Date End Date Taking? Authorizing Provider  Albuterol (PROVENTIL IN) Inhale 2.5 mg into the lungs every 6 (six) hours as needed. Reported on 11/07/2015    Historical Provider, MD  amiodarone (PACERONE) 200 MG tablet Take 1 tablet (200 mg total) by mouth daily. 01/10/15   Jolaine Artist, MD  amoxicillin (AMOXIL) 500 MG tablet Take 2 gms thirty minutes - one hour prior to procedure 10/20/15   Jolaine Artist, MD  budesonide-formoterol [facility]) 160-4.5 MCG/ACT inhaler Inhale 2 puffs into the lungs 2 (two) times daily. 01/10/15   Jolaine Artist, MD  cholecalciferol (VITAMIN D) 1000 UNITS tablet Take 1 tablet (1,000 Units total) by mouth 2 (two) times daily. 01/10/15   Jolaine Artist, MD  citalopram (CELEXA) 40 MG tablet Take 1 tablet (40 mg total) by mouth at bedtime. 01/10/15  Jolaine Artist, MD  docusate sodium (COLACE) 100 MG capsule Take 200 mg by mouth 2 (two) times daily.    Historical Provider, MD  furosemide (LASIX) 40 MG tablet Take 40 mg by mouth as needed for fluid.    Historical Provider, MD  hydrocortisone (ANUSOL-HC) 2.5 % rectal cream Place 1 application rectally 2 (two) times daily as needed for hemorrhoids or itching.    Historical Provider, MD  levothyroxine (SYNTHROID, LEVOTHROID) 125 MCG tablet Take 125 mcg by mouth daily before breakfast.    Historical Provider, MD  losartan (COZAAR) 50 MG tablet Take 1.5 tablets (75 mg total) by mouth at bedtime. 01/10/15   Jolaine Artist, MD  Multiple Vitamins-Minerals (MULTIVITAMIN PO) Take 1 tablet by mouth at bedtime.      Historical Provider, MD  nitroGLYCERIN (NITROSTAT) 0.4 MG SL tablet Place 0.4 mg under the tongue every 5 (five) minutes as needed for chest pain. Reported on 11/07/2015    Historical Provider, MD  pantoprazole (PROTONIX) 40 MG tablet Take 1 tablet (40 mg total) by mouth 2 (two) times daily. 01/10/15   Jolaine Artist, MD  potassium chloride (K-DUR) 10 MEQ tablet Take 10 mEq by mouth as needed (when taking furosemide).    Historical Provider, MD  simvastatin (ZOCOR) 80 MG tablet Take 0.5 tablets (40 mg total) by mouth at bedtime. 01/10/15   Jolaine Artist, MD  tiotropium (SPIRIVA) 18 MCG inhalation capsule Place 1 capsule (18 mcg total) into inhaler and inhale daily. 01/10/15   Jolaine Artist, MD  vitamin B-12 (CYANOCOBALAMIN) 1000 MCG tablet Take 1,000 mcg by mouth at bedtime.    Historical Provider, MD  vitamin C (ASCORBIC ACID) 500 MG tablet Take 1 tablet (500 mg total) by mouth 2 (two) times daily. 01/10/15   Jolaine Artist, MD  warfarin (COUMADIN) 5 MG tablet Take 2.5 mg on Mon and Fri. All other days take 5 mg . On  6/21 take 2.5 mg. . Patient taking differently: Take as directed 03/21/15   Conrad Winona, NP    Past Medical History: Past Medical History  Diagnosis Date  . Ischemic cardiomyopathy      CABG 2003, PCI 2007  EF 27%(myoview 2012  . Chronic systolic heart failure ([place])   . Hyperlipidemia   . Hypothyroidism   . Chronic anticoagulation     Afib and LVAD  . Obesity   . COPD (chronic obstructive pulmonary disease) ([place])   . Asbestosis(501)     "6 years in the [place]" (05/24/2013)  . Atrial fibrillation ([place])     permanent  . Paroxysmal ventricular tachycardia ([place])   . Coronary artery disease   . Hypertension   . Depression   . LVAD (left ventricular assist device) present ([place]) 12/2012  . Epistaxis 11/2015, 07/2014  . Cancer ([place])     "scraped some off behind my left ear; fast moving; having it cut out 12/05/2015" (11/07/2015)  . CHF (congestive heart failure) ([place])     . Myocardial infarction ([place]) 1990's-2000    "2 in  ~ the 1990's; 1 in ~ 2000" (11/07/2015)  . On home oxygen therapy     "have it available; don't use it" (11/07/2015)  . OSA on CPAP   . Anemia   . History of blood transfusion 08/2015 X 10; 11/2015 X 2    "related to bleeding on the inside somewhere" (11/07/2015)  . Complication of anesthesia     "they can't put me all the way to  sleep cause of my heart" (11/07/2015)    Past Surgical History: Past Surgical History  Procedure Laterality Date  . Coronary artery bypass graft  2003    "?X4" (05/24/2013)  . Cardiac defibrillator placement  2004; ~ 2010    "cut it out in 2016 after it got infected"   . Insertion of implantable left ventricular assist device N/A 01/12/2013    Procedure: INSERTION OF IMPLANTABLE LEFT VENTRICULAR ASSIST DEVICE;  Surgeon: Ivin Poot, MD;  Location: [place];  Service: Open Heart Surgery;  Laterality: N/A;   nitric oxide; Redo sternotomy  . Intraoperative transesophageal echocardiogram N/A 01/12/2013    Procedure: INTRAOPERATIVE TRANSESOPHAGEAL ECHOCARDIOGRAM;  Surgeon: Ivin Poot, MD;  Location: [place];  Service: Open Heart Surgery;  Laterality: N/A;  . Colonoscopy N/A 03/09/2014    Procedure: COLONOSCOPY;  Surgeon: Inda Castle, MD;  Location: [place];  Service: Endoscopy;  Laterality: N/A;  LVAD  patient  . Esophagogastroduodenoscopy N/A 03/09/2014    Procedure: ESOPHAGOGASTRODUODENOSCOPY (EGD);  Surgeon: Inda Castle, MD;  Location: [place];  Service: Endoscopy;  Laterality: N/A;  . Givens capsule study N/A 03/10/2014    Procedure: GIVENS CAPSULE STUDY;  Surgeon: Inda Castle, MD;  Location: [place];  Service: Endoscopy;  Laterality: N/A;  . Enteroscopy N/A 07/27/2014    Procedure: ENTEROSCOPY;  Surgeon: Gatha Mayer, MD;  Location: [place];  Service: Endoscopy;  Laterality: N/A;  LVAD patient   . Colonoscopy N/A 07/29/2014    Procedure: COLONOSCOPY;  Surgeon: Gatha Mayer, MD;   Location: [place];  Service: Endoscopy;  Laterality: N/A;  . Givens capsule study N/A 07/30/2014    Procedure: GIVENS CAPSULE STUDY;  Surgeon: Gatha Mayer, MD;  Location: [place];  Service: Endoscopy;  Laterality: N/A;  . Esophagogastroduodenoscopy N/A 08/01/2014    Procedure: ESOPHAGOGASTRODUODENOSCOPY (EGD);  Surgeon: Jerene Bears, MD;  Location: [facility] ENDOSCOPY;  Service: Endoscopy;  Laterality: N/A;  LVAD patient  . Left and right heart catheterization with coronary/graft angiogram  01/07/2013    Procedure: LEFT AND RIGHT HEART CATHETERIZATION WITH Beatrix Fetters;  Surgeon: Jolaine Artist, MD;  Location[facility] C[facility] LAB;  Service: Cardiovascular;;  . Enteroscopy N/A 09/27/2014    Procedure: ENTEROSCOPY;  Surgeon: Gatha Mayer, MD;  Location: [facility] ENDOSCOPY;  Service: Endoscopy;  Laterality: N/A;  . Icd lead removal Left 03/16/2015    Procedure: ICD LEAD REMOVAL/EXTRACTION;  Surgeon: Evans Lance, MD;  Location: [place];  Service: Cardiovascular;  Laterality: Left;  PATIENT HAS LVAD  DR. VAN TRIGT TO BACK UP EXTRACTION  . Appendectomy    . Enteroscopy N/A 11/09/2015    Procedure: ENTEROSCOPY;  Surgeon: Mauri Pole, MD;  Location: [facility] ENDOSCOPY;  Service: Endoscopy;  Laterality: N/A;  LVAD    Family History: Family History  Problem Relation Age of Onset  . Heart attack Mother   . Heart attack Father     Social History: Social History   Social History  . Marital Status: Married    Spouse Name: N/A  . Number of Children: N/A  . Years of Education: N/A   Social History Main Topics  . Smoking status: Former Smoker -- 2.00 packs/day for 45 years    Types: Cigarettes    Quit date: 11/11/2001  . Smokeless tobacco: Never Used  . Alcohol Use: Yes     Comment: 05/24/2013 "use to drink beer; hardly nothing since 2003; nothing at all since 12/2011 when I got my LVAD  "  .  Drug Use: No  . Sexual Activity: No   Other Topics Concern  . None   Social History  Narrative    Allergies:  No Known Allergies  Objective:    Vital Signs:   Temp:  [97.7 F (36.5 C)] 97.7 F (36.5 C) (03/10 1302) Pulse Rate:  [68-95] 95 (03/10 1445) Resp:  [16-28] 28 (03/10 1445) BP: (79)/(57) 79/57 mmHg (03/10 1445) SpO2:  [94 %-96 %] 94 % (03/10 1445)   There were no vitals filed for this visit.  Mean arterial Pressure 60s  Physical Exam: General:  Pale. Fatigued appearing. No resp difficulty HEENT: normal Neck: supple. JVP 6-7 . Carotids 2+ bilat; no bruits. No lymphadenopathy or thryomegaly appreciated. Cor: Mechanical heart sounds with LVAD hum present. Lungs: clear Abdomen: obese, soft, nontender, nondistended. No hepatosplenomegaly. No bruits or masses. Good bowel sounds. Driveline: C/D/I; securement device intact and driveline incorporated Extremities: no cyanosis, clubbing, rash, edema Neuro: alert & orientedx3, cranial nerves grossly intact. moves all 4 extremities w/o difficulty. Affect pleasant  Telemetry- Atrial flutter 70s   Labs: Basic Metabolic Panel:  Recent Labs Lab 12/08/15 1311  NA 139  K 4.3  CL 105  CO2 23  GLUCOSE 129*  BUN 29*  CREATININE 1.04  CALCIUM 10.0    Liver Function Tests: No results for input(s): AST, ALT, ALKPHOS, BILITOT, PROT, ALBUMIN in the last 168 hours. No results for input(s): LIPASE, AMYLASE in the last 168 hours. No results for input(s): AMMONIA in the last 168 hours.  CBC:  Recent Labs Lab 12/05/15 0917 12/08/15 1311  WBC 6.7 9.0  NEUTROABS 5.6  --   HGB 11.1* 9.7*  HCT 37.3* 32.5*  MCV 99.7 100.6*  PLT 140* 166    Cardiac Enzymes: No results for input(s): CKTOTAL, CKMB, CKMBINDEX, TROPONINI in the last 168 hours.  BNP: BNP (last 3 results)  Recent Labs  07/04/15 1147 08/22/15 1300 11/07/15 1043  BNP 267.1* 203.2* 398.1*    ProBNP (last 3 results) No results for input(s): PROBNP in the last 8760 hours.   CBG: No results for input(s): GLUCAP in the last 168  hours.  Coagulation Studies: No results for input(s): LABPROT, INR in the last 72 hours.  Other results: EKG: A fib 76 bpm Imaging: Dg Chest 2 View  12/08/2015  CLINICAL DATA:  Short of breath EXAM: CHEST  2 VIEW COMPARISON:  08/22/2015 FINDINGS: Prior CABG. Cardiac enlargement. Left ventricular assist device unchanged in position. Negative for heart failure or edema. Underlying COPD noted. Calcified pleural plaque in the lung bases bilaterally is unchanged and chronic. IMPRESSION: COPD.  Negative for heart failure or pneumonia. Electronically Signed   By: Franchot Gallo M.D.   On: 12/08/2015 13:31        Assessment:   1. Fatigue 2. Recurrent AF/AFL 3. Symptomatic Anemia  H/O GI bleed. Enteroscopy 2/17 normal 4. Chronic Systolic HF: S/P LVAD for DT 12/2012  5. OSA  Plan/Discussion:     I am unclear about what is causing Mr. Boehne's fatigue. Given abdominal symptoms may have gastroenteritis but he is also back in ALF and hemoglobin is down from 11.1> 9.7. Will admit and follow. If hgb stable will plan DC-CV. (INR therapeutic since 2/14). Will treat with IV amio overnight and keep npo.   From HF stand point he is stable. No diuretics for now. He is not on BB due to previous bradycardia. Map ok today. Hold off on losartan for now.   LVAD parameters are stable. Occasional PI  events. Continue weekly dressing changes.  I reviewed the LVAD parameters from today, and compared the results to the patient's prior recorded data.  No programming changes were made.  The LVAD is functioning within specified parameters.  The patient performs LVAD self-test daily.  LVAD interrogation was negative for any significant power changes, alarms or PI events/speed drops.  LVAD equipment check completed and is in good working order.  Back-up equipment present.   LVAD education done on emergency procedures and precautions and reviewed exit site care.  Length of Stay:   Glori Bickers MD 12/08/2015, 4:46  PM  VAD Team Pager [phone] (7am - 7am) +++VAD ISSUES ONLY+++ Advanced Heart Failure Team Pager [phone] (M-F; [place])  Please contact [facility] for night-coverage after hours (4p -7a ) and weekends on [email or web address] for all non- LVAD Issues

## 2015-12-08 NOTE — Progress Notes (Signed)
Wife called to report pt is weak, dizzy with standing; feels he is "out of rhythm".  Arrived in ED via private vehicle. Dr.Beh]nsimon in room to evaluate pt. Appears to be in atrial flutter, wife at bedside. MAP 72. Flow 6.1, speeed 9200 RPM, PI 3.2, powder 5.9, no alarms, and rare PI. Awaiting lab results.

## 2015-12-08 NOTE — ED Notes (Signed)
Report attempted 

## 2015-12-08 NOTE — ED Provider Notes (Signed)
CSN: [id number]     Arrival date & time 12/08/15  1254 History   First MD Initiated Contact with Patient 12/08/15 1354     Chief Complaint  Patient presents with  . Shortness of Breath     (Consider location/radiation/quality/duration/timing/severity/associated sxs/prior Treatment) HPI   Pt with complex cardiac hx s/p LVAD placement p/w lightheadedness, SOB, nausea, generalized weakness x 4 days.  States the symptoms began after a cancer was cut under local anesthesia from his neck.  Denies fevers, chills, cough, abdominal pain, vomiting, diarrhea.    Past Medical History  Diagnosis Date  . Ischemic cardiomyopathy      CABG 2003, PCI 2007  EF 27%(myoview 2012  . Chronic systolic heart failure ([place])   . Hyperlipidemia   . Hypothyroidism   . Chronic anticoagulation     Afib and LVAD  . Obesity   . COPD (chronic obstructive pulmonary disease) ([place])   . Asbestosis([place])     "6 years in the [place]" (05/24/2013)  . Atrial fibrillation ([place])     permanent  . Paroxysmal ventricular tachycardia ([place])   . Coronary artery disease   . Hypertension   . Depression   . LVAD (left ventricular assist device) present ([place]) 12/2012  . Epistaxis 11/2015, 07/2014  . Cancer ([place])     "scraped some off behind my left ear; fast moving; having it cut out 12/05/2015" (11/07/2015)  . CHF (congestive heart failure) ([place])   . Myocardial infarction ([place]) 1990's-2000    "2 in  ~ the 1990's; 1 in ~ 2000" (11/07/2015)  . On home oxygen therapy     "have it available; don't use it" (11/07/2015)  . OSA on CPAP   . Anemia   . History of blood transfusion 08/2015 X 10; 11/2015 X 2    "related to bleeding on the inside somewhere" (11/07/2015)  . Complication of anesthesia     "they can't put me all the way to sleep cause of my heart" (11/07/2015)   Past Surgical History  Procedure Laterality Date  . Coronary artery bypass graft  2003    "?X4" (05/24/2013)  . Cardiac defibrillator placement  2004; ~ 2010    "cut it out  in 2016 after it got infected"   . Insertion of implantable left ventricular assist device N/A 01/12/2013    Procedure: INSERTION OF IMPLANTABLE LEFT VENTRICULAR ASSIST DEVICE;  Surgeon: Ivin Poot, MD;  Location: [facility];  Service: Open Heart Surgery;  Laterality: N/A;   nitric oxide; Redo sternotomy  . Intraoperative transesophageal echocardiogram N/A 01/12/2013    Procedure: INTRAOPERATIVE TRANSESOPHAGEAL ECHOCARDIOGRAM;  Surgeon: Ivin Poot, MD;  Location: [facility];  Service: Open Heart Surgery;  Laterality: N/A;  . Colonoscopy N/A 03/09/2014    Procedure: COLONOSCOPY;  Surgeon: Inda Castle, MD;  Location: [facility];  Service: Endoscopy;  Laterality: N/A;  LVAD  patient  . Esophagogastroduodenoscopy N/A 03/09/2014    Procedure: ESOPHAGOGASTRODUODENOSCOPY (EGD);  Surgeon: Inda Castle, MD;  Location: [facility];  Service: Endoscopy;  Laterality: N/A;  . Givens capsule study N/A 03/10/2014    Procedure: GIVENS CAPSULE STUDY;  Surgeon: Inda Castle, MD;  Location: [facility];  Service: Endoscopy;  Laterality: N/A;  . Enteroscopy N/A 07/27/2014    Procedure: ENTEROSCOPY;  Surgeon: Gatha Mayer, MD;  Location: [facility];  Service: Endoscopy;  Laterality: N/A;  LVAD patient   . Colonoscopy N/A 07/29/2014    Procedure: COLONOSCOPY;  Surgeon: Gatha Mayer, MD;  Location: [facility]  ENDOSCOPY;  Service: Endoscopy;  Laterality: N/A;  . Givens capsule study N/A 07/30/2014    Procedure: GIVENS CAPSULE STUDY;  Surgeon: Gatha Mayer, MD;  Location: [facility];  Service: Endoscopy;  Laterality: N/A;  . Esophagogastroduodenoscopy N/A 08/01/2014    Procedure: ESOPHAGOGASTRODUODENOSCOPY (EGD);  Surgeon: Jerene Bears, MD;  Location: [facility] ENDOSCOPY;  Service: Endoscopy;  Laterality: N/A;  LVAD patient  . Left and right heart catheterization with coronary/graft angiogram  01/07/2013    Procedure: LEFT AND RIGHT HEART CATHETERIZATION WITH Beatrix Fetters;  Surgeon: Jolaine Artist, MD;   Location: [facility] CATH LAB;  Service: Cardiovascular;;  . Enteroscopy N/A 09/27/2014    Procedure: ENTEROSCOPY;  Surgeon: Gatha Mayer, MD;  Location: [facility] ENDOSCOPY;  Service: Endoscopy;  Laterality: N/A;  . Icd lead removal Left 03/16/2015    Procedure: ICD LEAD REMOVAL/EXTRACTION;  Surgeon: Evans Lance, MD;  Location: [facility];  Service: Cardiovascular;  Laterality: Left;  PATIENT HAS LVAD  DR. VAN TRIGT TO BACK UP EXTRACTION  . Appendectomy    . Enteroscopy N/A 11/09/2015    Procedure: ENTEROSCOPY;  Surgeon: Mauri Pole, MD;  Location: [facility] ENDOSCOPY;  Service: Endoscopy;  Laterality: N/A;  LVAD   Family History  Problem Relation Age of Onset  . Heart attack Mother   . Heart attack Father    Social History  Substance Use Topics  . Smoking status: Former Smoker -- 2.00 packs/day for 45 years    Types: Cigarettes    Quit date: 11/11/2001  . Smokeless tobacco: Never Used  . Alcohol Use: Yes     Comment: 05/24/2013 "use to drink beer; hardly nothing since 2003; nothing at all since 12/2011 when I got my LVAD  "    Review of Systems  All other systems reviewed and are negative.     Allergies  Review of patient's allergies indicates no known allergies.  Home Medications   Prior to Admission medications   Medication Sig Start Date End Date Taking? Authorizing Provider  amiodarone (PACERONE) 200 MG tablet Take 2 tablets (400 mg total) by mouth daily. 11/14/15  Yes Amy D Clegg, NP  budesonide-formoterol (SYMBICORT) 160-4.5 MCG/ACT inhaler Inhale 2 puffs into the lungs 2 (two) times daily. Patient taking differently: Inhale 2 puffs into the lungs daily.  01/10/15  Yes Jolaine Artist, MD  cholecalciferol (VITAMIN D) 1000 UNITS tablet Take 1 tablet (1,000 Units total) by mouth 2 (two) times daily. 01/10/15  Yes Jolaine Artist, MD  citalopram (CELEXA) 40 MG tablet Take 1 tablet (40 mg total) by mouth at bedtime. 01/10/15  Yes Jolaine Artist, MD  docusate sodium (COLACE) 100 MG  capsule Take 200 mg by mouth 2 (two) times daily.   Yes Historical Provider, MD  hydrocortisone (ANUSOL-HC) 2.5 % rectal cream Place 1 application rectally 2 (two) times daily. 11/21/15  Yes Larey Dresser, MD  ipratropium-albuterol (DUONEB) 0.5-2.5 (3) MG/3ML SOLN Take 3 mLs by nebulization every 6 (six) hours as needed (copd).   Yes Historical Provider, MD  levothyroxine (SYNTHROID, LEVOTHROID) 100 MCG tablet Take 0.5 mcg by mouth at bedtime.   Yes Historical Provider, MD  losartan (COZAAR) 100 MG tablet Take 1 tablet (100 mg total) by mouth at bedtime. 11/21/15  Yes Larey Dresser, MD  Multiple Vitamins-Minerals (MULTIVITAMIN PO) Take 1 tablet by mouth daily.    Yes Historical Provider, MD  nitroGLYCERIN (NITROSTAT) 0.4 MG SL tablet Place 0.4 mg under the tongue every 5 (five) minutes as needed  for chest pain.   Yes Historical Provider, MD  pantoprazole (PROTONIX) 40 MG tablet Take 1 tablet (40 mg total) by mouth 2 (two) times daily. 01/10/15  Yes Dolores Patty, MD  simvastatin (ZOCOR) 80 MG tablet Take 0.5 tablets (40 mg total) by mouth at bedtime. 01/10/15  Yes Dolores Patty, MD  tiotropium (SPIRIVA) 18 MCG inhalation capsule Place 1 capsule (18 mcg total) into inhaler and inhale daily. 01/10/15  Yes Dolores Patty, MD  vitamin B-12 (CYANOCOBALAMIN) 1000 MCG tablet Take 1,000 mcg by mouth at bedtime.   Yes Historical Provider, MD  vitamin C (ASCORBIC ACID) 500 MG tablet Take 1 tablet (500 mg total) by mouth 2 (two) times daily. 01/10/15  Yes Dolores Patty, MD  warfarin (COUMADIN) 5 MG tablet Take 2.5 mg on Mon and Fri. All other days take 5 mg . On  6/21 take 2.5 mg. . Patient taking differently: Take 2.5-5 mg by mouth daily at 6 PM. Takes 1/2 tab on Mon, Wed and Fri  Takes 1 tab all other days 03/21/15  Yes Amy D Clegg, NP  furosemide (LASIX) 40 MG tablet Take 40 mg by mouth as needed for fluid. Reported on 11/21/2015    Historical Provider, MD  potassium chloride (K-DUR) 10 MEQ  tablet Take 10 mEq by mouth as needed (when taking furosemide).    Historical Provider, MD   BP   Pulse 71  Temp(Src) 97.7 F (36.5 C) (Oral)  Resp 19  SpO2 96% Physical Exam  Constitutional: He appears well-developed and well-nourished. No distress.  HENT:  Head: Normocephalic and atraumatic.  Neck: Neck supple.  Cardiovascular:  Mechanical hum of LVAD  Pulmonary/Chest: Effort normal and breath sounds normal. No respiratory distress. He has no wheezes. He has no rales.  anteriorly  Abdominal: Soft. He exhibits no distension. There is no tenderness. There is no rebound and no guarding.  Neurological: He is alert. He exhibits normal muscle tone.  Skin: He is not diaphoretic.  Nursing note and vitals reviewed.   ED Course  Procedures (including critical care time) Labs Review Labs Reviewed  BASIC METABOLIC PANEL - Abnormal; Notable for the following:    Glucose, Bld 129 (*)    BUN 29 (*)    All other components within normal limits  CBC - Abnormal; Notable for the following:    RBC 3.23 (*)    Hemoglobin 9.7 (*)    HCT 32.5 (*)    MCV 100.6 (*)    MCHC 29.8 (*)    RDW 20.4 (*)    All other components within normal limits  LACTATE DEHYDROGENASE - Abnormal; Notable for the following:    LDH 316 (*)    All other components within normal limits  PROTIME-INR - Abnormal; Notable for the following:    Prothrombin Time 22.7 (*)    INR 2.01 (*)    All other components within normal limits  PROTIME-INR  I-STAT TROPOININ, ED  TYPE AND SCREEN    Imaging Review Dg Chest 2 View  12/08/2015  CLINICAL DATA:  Short of breath EXAM: CHEST  2 VIEW COMPARISON:  08/22/2015 FINDINGS: Prior CABG. Cardiac enlargement. Left ventricular assist device unchanged in position. Negative for heart failure or edema. Underlying COPD noted. Calcified pleural plaque in the lung bases bilaterally is unchanged and chronic. IMPRESSION: COPD.  Negative for heart failure or pneumonia. Electronically Signed    By: Marlan Palau M.D.   On: 12/08/2015 13:31   I have personally reviewed  and evaluated these images and lab results as part of my medical decision-making.   EKG Interpretation None       Pt seen by LVAD team in ED upon arrival to room.  Dispo and plan per their recommendations.    MDM   Final diagnoses:  Symptomatic anemia  Other fatigue  LVAD (left ventricular assist device) present (HCC)    Afebrile nontoxic patient with several days of lightheadedness, SOB, nausea,fatigue.  Met in the room by Dr Jeffie Pollock and LVAD team, who have admitted the patient to their service.      Clayton Bibles, PA-C 12/08/15 [place], MD 12/11/15 [phone]

## 2015-12-08 NOTE — ED Notes (Signed)
Dr. Jeffie Pollock at bedside with lvad nurse

## 2015-12-08 NOTE — ED Notes (Signed)
Pt reports to the ED for eval of SOB with minimal exertion. Pt also reports he has been having nausea and generalized weakness since he had a cancer removed from his left neck. He is an LVAD patient. Pt denies any cough or CP. Pt A&Ox4, resp e/u, and skin warm and dry.

## 2015-12-09 DIAGNOSIS — K922 Gastrointestinal hemorrhage, unspecified: Secondary | ICD-10-CM

## 2015-12-09 DIAGNOSIS — I4892 Unspecified atrial flutter: Secondary | ICD-10-CM

## 2015-12-09 LAB — CBC WITH DIFFERENTIAL/PLATELET
BASOS PCT: 0 %
Basophils Absolute: 0 10*3/uL (ref 0.0–0.1)
Eosinophils Absolute: 0.2 10*3/uL (ref 0.0–0.7)
Eosinophils Relative: 3 %
HEMATOCRIT: 27.3 % — AB (ref 39.0–52.0)
Hemoglobin: 8.2 g/dL — ABNORMAL LOW (ref 13.0–17.0)
LYMPHS ABS: 0.7 10*3/uL (ref 0.7–4.0)
LYMPHS PCT: 10 %
MCH: 30.4 pg (ref 26.0–34.0)
MCHC: 30 g/dL (ref 30.0–36.0)
MCV: 101.1 fL — AB (ref 78.0–100.0)
MONO ABS: 0.5 10*3/uL (ref 0.1–1.0)
MONOS PCT: 7 %
NEUTROS ABS: 5.6 10*3/uL (ref 1.7–7.7)
Neutrophils Relative %: 80 %
PLATELETS: 153 10*3/uL (ref 150–400)
RBC: 2.7 MIL/uL — ABNORMAL LOW (ref 4.22–5.81)
RDW: 20.8 % — AB (ref 11.5–15.5)
WBC: 7.1 10*3/uL (ref 4.0–10.5)

## 2015-12-09 LAB — COMPREHENSIVE METABOLIC PANEL
ALT: 17 U/L (ref 17–63)
AST: 36 U/L (ref 15–41)
Albumin: 3.1 g/dL — ABNORMAL LOW (ref 3.5–5.0)
Alkaline Phosphatase: 39 U/L (ref 38–126)
Anion gap: 6 (ref 5–15)
BUN: 26 mg/dL — ABNORMAL HIGH (ref 6–20)
CO2: 25 mmol/L (ref 22–32)
Calcium: 9.1 mg/dL (ref 8.9–10.3)
Chloride: 108 mmol/L (ref 101–111)
Creatinine, Ser: 0.94 mg/dL (ref 0.61–1.24)
GFR calc Af Amer: >60 mL/min (ref >60)
GFR calc non Af Amer: >60 mL/min (ref >60)
Glucose, Bld: 108 mg/dL — ABNORMAL HIGH (ref 65–99)
Potassium: 4 mmol/L (ref 3.5–5.1)
Sodium: 139 mmol/L (ref 135–145)
Total Bilirubin: 0.4 mg/dL (ref 0.3–1.2)
Total Protein: 5.4 g/dL — ABNORMAL LOW (ref 6.5–8.1)

## 2015-12-09 LAB — PREPARE RBC (CROSSMATCH)

## 2015-12-09 LAB — PROTIME-INR
INR: 1.98 — ABNORMAL HIGH (ref 0.00–1.49)
Prothrombin Time: 22.4 seconds — ABNORMAL HIGH (ref 11.6–15.2)

## 2015-12-09 LAB — LACTATE DEHYDROGENASE: LDH: 255 U/L — AB (ref 98–192)

## 2015-12-09 LAB — MRSA PCR SCREENING: MRSA by PCR: NEGATIVE

## 2015-12-09 MED ORDER — LEVOTHYROXINE SODIUM 100 MCG PO TABS
100.0000 ug | ORAL_TABLET | Freq: Every day | ORAL | Status: DC
  Administered 2015-12-09 – 2015-12-13 (×5): 100 ug via ORAL
  Filled 2015-12-09 (×5): qty 1

## 2015-12-09 MED ORDER — POLYETHYLENE GLYCOL 3350 17 G PO PACK
17.0000 g | PACK | Freq: Two times a day (BID) | ORAL | Status: AC
  Administered 2015-12-09 (×2): 17 g via ORAL
  Filled 2015-12-09 (×2): qty 1

## 2015-12-09 MED ORDER — SODIUM CHLORIDE 0.9 % IV SOLN
Freq: Once | INTRAVENOUS | Status: AC
  Administered 2015-12-09: 13:00:00 via INTRAVENOUS

## 2015-12-09 MED ORDER — WARFARIN SODIUM 5 MG PO TABS
5.0000 mg | ORAL_TABLET | Freq: Once | ORAL | Status: AC
  Administered 2015-12-09: 5 mg via ORAL
  Filled 2015-12-09: qty 1

## 2015-12-09 MED ORDER — SODIUM CHLORIDE 0.9 % IV SOLN
INTRAVENOUS | Status: DC
  Administered 2015-12-09: 17:00:00 via INTRAVENOUS

## 2015-12-09 MED ORDER — PEG-KCL-NACL-NASULF-NA ASC-C 100 G PO SOLR
1.0000 | Freq: Once | ORAL | Status: AC
  Administered 2015-12-10: 200 g via ORAL
  Filled 2015-12-09: qty 1

## 2015-12-09 NOTE — Consult Note (Signed)
Referring Provider: Dr. Haroldine Laws Primary Care Physician:  Thressa Sheller, MD Primary Gastroenterologist:  Dr. Silverio Decamp  Reason for Consultation:  Recurrent GIB     HPI: [name] is a 75 y.o. male PMH CAD s/p CABG (123456), chronic systolic HF s/p ICD, hyperlipidemia, hypothyroidism, emphysema, OSA and PAF. COPD, asbestosis. Quit smoking 2003. Uses CPAP and O2 every night. LVAD (destination) implant 01/11/13. Hx Afib; DCCV on 09/21/2014. 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 CT abdomen of 02/2014 and CT chest of 12/2012 showed cirrhosis. Gallstones noted on CT abdomen 02/2014. Chronic Coumadin.  Multiple endoscopic studies for blood loss anemia, GI bleeding.  09/27/2014 Enteroscopy: Dr Carlean Purl Area of bleeding in jejunum at extent of scope insertion, treated APC and bleeding slowed. 3 clips placed. Some ooze of blood after that. Exact site not seen but presumed AVM in this situation. " ...if persistent bleeding suspected: rescope and retreat off anti-coag... EGD and colonoscopy 03/09/2014. (Dr Carlean Purl) Below:6 mm polyp clipped and removed, internal rrhoids, mild Above: erosions at [employer]. Enteroscopy 08/01/14. Dr Hilarie Fredrickson. 1. The mucosa of the esophagus appeared normal 2. Small hiatal hernia 3. The mucosa of the stomach appeared normal 4. 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 Otherwise normal duodenum 6. The exam showed no abnormalities in the examined portions of the proximal jejunum Capsule endo 07/30/2014: bleeding in duodenum. Melenic fluid distal to this.  Colonoscopy 07/29/14.  ENDOSCOPIC IMPRESSION: 1. Two sessile polyps ranging from 3 to 49mm in size were found in the right colon 2. The colon examination was otherwise normal with fair prep after extensive irrigation and  suctioning 3. Internal hemorrhoids in rectum Enteroscopy 07/27/14.  IMPRESSIONS: Irregular Z-line and small hiatal hernia but otherwise normal esophagus, stomach, duodenum and proximal jejunum Capsule endo 03/10/2014: Gastric and small bowel angioectasia, inflammation at 2 hours 40 minutes Feraheme infusions at cancer center 10/2014 x 2, 01/2015, 03/2015 x 2, 09/11/15, 10/18/15, 10/25/15.  PRBC transfusion 09/2014, 02/2014, 01/2014, 08/2013, 12/2012   HE is status post a recent admission 11/07/2015 through 11/14/2015.  Enteroscopy 11/09/2015 was unremarkable. He was advised that if he had recurrent bleeding he would need small bowel capsule endoscopy. He presented to the emergency room yesterday with complaints of several days of weakness and nausea. He did not vomit and had no diarrhea fever or chills. He reports feeling dizzy and weak whenever he stands up. He denied chest pain or shortness of breath. He has not been having any bright red blood per rectum. Hemoglobin was noted to be 9.7 down from 11.1  4 days prior.  Past Medical History  Diagnosis Date  . Ischemic cardiomyopathy      CABG 2003, PCI 2007  EF 27%(myoview 2012  . Chronic systolic heart failure ([place])   . Hyperlipidemia   . Hypothyroidism   . Chronic anticoagulation     Afib and LVAD  . Obesity   . COPD (chronic obstructive pulmonary disease) ([place])   . Asbestosis([place])     "6 years in the [place]" (05/24/2013)  . Atrial fibrillation ([place])     permanent  . Paroxysmal ventricular tachycardia ([place])   . Coronary artery disease   . Hypertension   . Depression   . LVAD (left ventricular assist device) present ([place]) 12/2012  . Epistaxis 11/2015, 07/2014  . Cancer ([place])     "scraped some  off behind my left ear; fast moving; having it cut out 12/05/2015" (11/07/2015)  . CHF (congestive heart failure) ([place])   . Myocardial infarction ([place]) 1990's-2000    "2 in  ~ the 1990's; 1 in ~ 2000" (11/07/2015)  . On home oxygen therapy     "have it  available; don't use it" (11/07/2015)  . OSA on CPAP   . Anemia   . History of blood transfusion 08/2015 X 10; 11/2015 X 2    "related to bleeding on the inside somewhere" (11/07/2015)  . Complication of anesthesia     "they can't put me all the way to sleep cause of my heart" (11/07/2015)    Past Surgical History  Procedure Laterality Date  . Coronary artery bypass graft  2003    "?X4" (05/24/2013)  . Cardiac defibrillator placement  2004; ~ 2010    "cut it out in 2016 after it got infected"   . Insertion of implantable left ventricular assist device N/A 01/12/2013    Procedure: INSERTION OF IMPLANTABLE LEFT VENTRICULAR ASSIST DEVICE;  Surgeon: Ivin Poot, MD;  Location: [facility];  Service: Open Heart Surgery;  Laterality: N/A;   nitric oxide; Redo sternotomy  . Intraoperative transesophageal echocardiogram N/A 01/12/2013    Procedure: INTRAOPERATIVE TRANSESOPHAGEAL ECHOCARDIOGRAM;  Surgeon: Ivin Poot, MD;  Location: [place];  Service: Open Heart Surgery;  Laterality: N/A;  . Colonoscopy N/A 03/09/2014    Procedure: COLONOSCOPY;  Surgeon: Inda Castle, MD;  Location: [place];  Service: Endoscopy;  Laterality: N/A;  LVAD  patient  . Esophagogastroduodenoscopy N/A 03/09/2014    Procedure: ESOPHAGOGASTRODUODENOSCOPY (EGD);  Surgeon: Inda Castle, MD;  Location: [facility];  Service: Endoscopy;  Laterality: N/A;  . Givens capsule study N/A 03/10/2014    Procedure: GIVENS CAPSULE STUDY;  Surgeon: Inda Castle, MD;  Location: [place];  Service: Endoscopy;  Laterality: N/A;  . Enteroscopy N/A 07/27/2014    Procedure: ENTEROSCOPY;  Surgeon: Gatha Mayer, MD;  Location: [place];  Service: Endoscopy;  Laterality: N/A;  LVAD patient   . Colonoscopy N/A 07/29/2014    Procedure: COLONOSCOPY;  Surgeon: Gatha Mayer, MD;  Location: [place];  Service: Endoscopy;  Laterality: N/A;  . Givens capsule study N/A 07/30/2014    Procedure: GIVENS CAPSULE STUDY;  Surgeon: Gatha Mayer, MD;  Location: [place];  Service: Endoscopy;  Laterality: N/A;  . Esophagogastroduodenoscopy N/A 08/01/2014    Procedure: ESOPHAGOGASTRODUODENOSCOPY (EGD);  Surgeon: Jerene Bears, MD;  Location[facility] ENDOSCOPY;  Service: Endoscopy;  Laterality: N/A;  LVAD patient  . Left and right heart catheterization with coronary/graft angiogram  01/07/2013    Procedure: LEFT AND RIGHT HEART CATHETERIZATION WITH Beatrix Fetters;  Surgeon: Jolaine Artist, MD;  Location: [facility] CATH LAB;  Service: Cardiovascular;;  . Enteroscopy N/A 09/27/2014    Procedure: ENTEROSCOPY;  Surgeon: Gatha Mayer, MD;  Location: [facility] ENDOSCOPY;  Service: Endoscopy;  Laterality: N/A;  . Icd lead removal Left 03/16/2015    Procedure: ICD LEAD REMOVAL/EXTRACTION;  Surgeon: Evans Lance, MD;  Location: [place];  Service: Cardiovascular;  Laterality: Left;  PATIENT HAS LVAD  DR. VAN TRIGT TO BACK UP EXTRACTION  . Appendectomy    . Enteroscopy N/A 11/09/2015    Procedure: ENTEROSCOPY;  Surgeon: Mauri Pole, MD;  Location: [facility] ENDOSCOPY;  Service: Endoscopy;  Laterality: N/A;  LVAD    Prior to Admission medications   Medication Sig Start Date End Date Taking? Authorizing Provider  amiodarone (PACERONE) 200 MG tablet Take 2 tablets (400 mg total) by mouth daily. 11/14/15  Yes Amy D Clegg, NP  budesonide-formoterol (SYMBICORT) 160-4.5 MCG/ACT inhaler Inhale 2 puffs into the lungs 2 (two) times daily. Patient taking differently: Inhale 2 puffs into the lungs daily.  01/10/15  Yes Jolaine Artist, MD  cholecalciferol (VITAMIN D) 1000 UNITS tablet Take 1 tablet (1,000 Units total) by mouth 2 (two) times daily. 01/10/15  Yes Jolaine Artist, MD  citalopram (CELEXA) 40 MG tablet Take 1 tablet (40 mg total) by mouth at bedtime. 01/10/15  Yes Jolaine Artist, MD  docusate sodium (COLACE) 100 MG capsule Take 200 mg by mouth 2 (two) times daily.   Yes Historical Provider, MD  hydrocortisone (ANUSOL-HC) 2.5 % rectal  cream Place 1 application rectally 2 (two) times daily. 11/21/15  Yes Larey Dresser, MD  ipratropium-albuterol (DUONEB) 0.5-2.5 (3) MG/3ML SOLN Take 3 mLs by nebulization every 6 (six) hours as needed (copd).   Yes Historical Provider, MD  levothyroxine (SYNTHROID, LEVOTHROID) 100 MCG tablet Take 100 mcg by mouth at bedtime.    Yes Historical Provider, MD  losartan (COZAAR) 100 MG tablet Take 1 tablet (100 mg total) by mouth at bedtime. 11/21/15  Yes Larey Dresser, MD  Multiple Vitamins-Minerals (MULTIVITAMIN PO) Take 1 tablet by mouth daily.    Yes Historical Provider, MD  nitroGLYCERIN (NITROSTAT) 0.4 MG SL tablet Place 0.4 mg under the tongue every 5 (five) minutes as needed for chest pain.   Yes Historical Provider, MD  pantoprazole (PROTONIX) 40 MG tablet Take 1 tablet (40 mg total) by mouth 2 (two) times daily. 01/10/15  Yes Jolaine Artist, MD  simvastatin (ZOCOR) 80 MG tablet Take 0.5 tablets (40 mg total) by mouth at bedtime. 01/10/15  Yes Jolaine Artist, MD  tiotropium (SPIRIVA) 18 MCG inhalation capsule Place 1 capsule (18 mcg total) into inhaler and inhale daily. 01/10/15  Yes Jolaine Artist, MD  vitamin B-12 (CYANOCOBALAMIN) 1000 MCG tablet Take 1,000 mcg by mouth at bedtime.   Yes Historical Provider, MD  vitamin C (ASCORBIC ACID) 500 MG tablet Take 1 tablet (500 mg total) by mouth 2 (two) times daily. 01/10/15  Yes Jolaine Artist, MD  warfarin (COUMADIN) 5 MG tablet Take 2.5 mg on Mon and Fri. All other days take 5 mg . On  6/21 take 2.5 mg. . Patient taking differently: Take 2.5-5 mg by mouth daily at 6 PM. Takes 1/2 tab on Mon, Wed and Fri  Takes 1 tab all other days 03/21/15  Yes Amy D Clegg, NP  furosemide (LASIX) 40 MG tablet Take 40 mg by mouth as needed for fluid. Reported on 11/21/2015    Historical Provider, MD  potassium chloride (K-DUR) 10 MEQ tablet Take 10 mEq by mouth as needed (when taking furosemide).    Historical Provider, MD    Current  Facility-Administered Medications  Medication Dose Route Frequency Provider Last Rate Last Dose  . amiodarone (NEXTERONE PREMIX) 360 MG/200ML (1.8 mg/mL) IV infusion  30 mg/hr Intravenous Continuous Shirley Friar, PA-C 16.7 mL/hr at 12/09/15 0403 30 mg/hr at 12/09/15 0403  . atorvastatin (LIPITOR) tablet 40 mg  40 mg Oral q1800 Shirley Friar, PA-C   40 mg at 12/08/15 2341  . cholecalciferol (VITAMIN D) tablet 1,000 Units  1,000 Units Oral BID Satira Mccallum Tillery, PA-C   1,000 Units at 12/09/15 0944  . docusate sodium (COLACE) capsule 200 mg  200 mg Oral BID Legrand Como  Joesph July, PA-C   200 mg at 12/09/15 [id number]  . furosemide (LASIX) tablet 40 mg  40 mg Oral PRN Satira Mccallum Tillery, PA-C      . hydrocortisone (ANUSOL-HC) 2.5 % rectal cream 1 application  1 application Rectal BID Shirley Friar, PA-C   1 application at [id number]  . ipratropium-albuterol (DUONEB) 0.5-2.5 (3) MG/3ML nebulizer solution 3 mL  3 mL Nebulization Q6H PRN Satira Mccallum Tillery, PA-C      . levothyroxine (SYNTHROID, LEVOTHROID) tablet 100 mcg  100 mcg Oral QHS Skeet Simmer, [facility]      . mometasone-formoterol (DULERA) 200-5 MCG/ACT inhaler 2 puff  2 puff Inhalation BID Shirley Friar, PA-C   2 puff at 12/09/15 [id number]  . multivitamin liquid 15 mL  15 mL Oral Daily Satira Mccallum Tillery, PA-C   15 mL at 12/09/15 0944  . nitroGLYCERIN (NITROSTAT) SL tablet 0.4 mg  0.4 mg Sublingual Q5 min PRN Satira Mccallum Tillery, PA-C      . pantoprazole (PROTONIX) EC tablet 40 mg  40 mg Oral BID Satira Mccallum Tillery, PA-C   40 mg at 12/09/15 0944  . polyethylene glycol (MIRALAX / GLYCOLAX) packet 17 g  17 g Oral BID Jolaine Artist, MD      . potassium chloride (K-DUR) CR tablet 10 mEq  10 mEq Oral PRN Satira Mccallum Tillery, PA-C      . tiotropium [employer]) inhalation capsule 18 mcg  18 mcg Inhalation Daily Shirley Friar, PA-C   18 mcg at 12/09/15 [id number]  . vitamin B-12 (CYANOCOBALAMIN)  tablet 1,000 mcg  1,000 mcg Oral QHS Shirley Friar, PA-C   1,000 mcg at 12/08/15 2341  . vitamin C (ASCORBIC ACID) tablet 500 mg  500 mg Oral BID Satira Mccallum Tillery, PA-C   500 mg at 12/09/15 0944  . warfarin (COUMADIN) tablet 5 mg  5 mg Oral ONCE-1800 Darl Pikes, [facility]      . Warfarin - Pharmacist Dosing Inpatient   Does not apply [place], [facility]        Allergies as of 12/08/2015  . (No Known Allergies)    Family History  Problem Relation Age of Onset  . Heart attack Mother   . Heart attack Father     Social History   Social History  . Marital Status: Married    Spouse Name: N/A  . Number of Children: N/A  . Years of Education: N/A   Occupational History  . Not on file.   Social History Main Topics  . Smoking status: Former Smoker -- 2.00 packs/day for 45 years    Types: Cigarettes    Quit date: 11/11/2001  . Smokeless tobacco: Never Used  . Alcohol Use: Yes     Comment: 05/24/2013 "use to drink beer; hardly nothing since 2003; nothing at all since 12/2011 when I got my LVAD  "  . Drug Use: No  . Sexual Activity: No   Other Topics Concern  . Not on file   Social History Narrative    Review of Systems: Gen: Denies any fever, chills, sweats, anorexia, malaise, weight loss, and sleep disorder. Admits to weakness and fatigue CV: Denies chest pain, angina, palpitations, syncope, orthopnea, PND, peripheral edema, and claudication. Resp: Denies dyspnea at rest, dyspnea with exercise, cough, sputum, wheezing, coughing up blood, and pleurisy. GI: Denies vomiting blood, jaundice, and fecal incontinence.   Denies dysphagia or odynophagia. GU : Denies urinary burning, blood in urine, urinary frequency,  urinary hesitancy, nocturnal urination, and urinary incontinence. MS: Denies joint pain, limitation of movement, and swelling, stiffness, low back pain, extremity pain. Denies muscle weakness, cramps, atrophy.  Derm: Denies rash, itching, dry skin, hives,  moles, warts, or unhealing ulcers.  Psych: Denies depression, anxiety, memory loss, suicidal ideation, hallucinations, paranoia, and confusion. Heme: Denies bruising, bleeding, and enlarged lymph nodes. Neuro:  Denies any headaches,  paresthesias. Endo:  Denies any problems with DM, thyroid, adrenal function.  Physical Exam: Vital signs in last 24 hours: Temp:  [97.4 F (36.3 C)-98.3 F (36.8 C)] 98.3 F (36.8 C) (03/11 1236) Pulse Rate:  [54-95] 61 (03/11 1236) Resp:  [15-28] 26 (03/11 1236) BP: (79)/(57) 79/57 mmHg (03/10 1445) SpO2:  [85 %-100 %] 95 % (03/11 1236) Weight:  [219 lb 2.2 oz (99.4 kg)] 219 lb 2.2 oz (99.4 kg) (03/10 2200)   General:   Alert,  Well-developed,  Pale male in no apparent distress. Head:  Normocephalic and atraumatic. Eyes:  Sclera clear, no icterus.   Conjunctiva  pale Ears:  Normal auditory acuity. Nose:  No deformity, discharge,  or lesions. Mouth:  No deformity or lesions.   Neck:  Supple; no masses or thyromegaly. Lungs:  Clear throughout to auscultation.   No wheezes, crackles, or rhonchi.  Heart:   Mechanical heart sounds with LVAD hum.  Abdomen:  Soft,nontender, BS active,nonpalp mass or hsm.   Msk:  Symmetrical without gross deformities. . Pulses:  Normal pulses noted. Extremities: Without clubbing or edema. Neurologic: Alert and  oriented x4;  grossly normal neurologically. Skin: Intact without significant lesions or rashes.. Psych:  Alert and cooperative. Normal mood and affect.   Lab Results:  Recent Labs  12/08/15 1311 12/09/15 0258  WBC 9.0 7.1  HGB 9.7* 8.2*  HCT 32.5* 27.3*  PLT 166 153   BMET  Recent Labs  12/08/15 1311 12/09/15 0258  NA 139 139  K 4.3 4.0  CL 105 108  CO2 23 25  GLUCOSE 129* 108*  BUN 29* 26*  CREATININE 1.04 0.94  CALCIUM 10.0 9.1   LFT  Recent Labs  12/09/15 0258  PROT 5.4*  ALBUMIN 3.1*  AST 36  ALT 17  ALKPHOS 39  BILITOT 0.4   PT/INR  Recent Labs  12/08/15 1630  12/09/15 0258  LABPROT 22.7* 22.4*  INR 2.01* 1.98*   Studies/Results: Dg Chest 2 View  12/08/2015  CLINICAL DATA:  Short of breath EXAM: CHEST  2 VIEW COMPARISON:  08/22/2015 FINDINGS: Prior CABG. Cardiac enlargement. Left ventricular assist device unchanged in position. Negative for heart failure or edema. Underlying COPD noted. Calcified pleural plaque in the lung bases bilaterally is unchanged and chronic. IMPRESSION: COPD.  Negative for heart failure or pneumonia. Electronically Signed   By: Franchot Gallo M.D.   On: 12/08/2015 13:31    IMPRESSION/PLAN:  75 year old male with recurrent anemia presumed due to GI blood loss from known AVMs.   Status post LVAD , on chronic Coumadin.    history A. Fib Cirrhosis by CT June 2015. No portal hypertension or varices on previous enteroscopy.   COPD, OSA. On nocturnal oxygen and sleep   Trend hemoglobin, transfuse as needed. Check stool for occult blood. Will plan on small bowel capsule endoscopy Monday. Full liquids today, clear liquids tomorrow with half MoviPrep Sunday night.   Hvozdovic, Deloris Ping 12/09/2015,  Pager [phone]  Mon-Fri 8a-5p [phone] after 5p, weekends, holidays    [employer] [facility] Attending   I have taken an interval history,  reviewed the chart and examined the patient. I agree with the Advanced Practitioner's note, impression and recommendations.     Complicated delightful man w/ chronic anemia in setting of LVAD and warfarin - w/ recent unexplained decline in Hgb. Bleeding risk high here - could be from small bowel though do not see signs of hemorrhage at this point. Reasonable to pursue a capsule endoscopy exam - aim for Monday.  Note that last push enteroscopy exam recs suggest hemorrhoid banding - HE HAS CIRRHOSIS SO THAT IS NOT APPROPRIATE AS MAY HAVE RECTAL VARICES  Gatha Mayer, MD, [facility] [phone] (pager) [phone] after 5 PM, weekends and holidays  12/09/2015 7:00 PM

## 2015-12-09 NOTE — Progress Notes (Signed)
HeartMate 2 Rounding Note  Subjective:     Feels ok. No BM or ab pain.   Hgb 11.1 -> 9.7 -> 8.2  LVAD INTERROGATION:  HeartMate II LVAD: Flow 5.5  liters/min, speed 9200, power 5.7 , PI 5.7 no PI events. .    Objective:    Vital Signs:   Temp:  [97.4 F (36.3 C)-98 F (36.7 C)] 98 F (36.7 C) (03/11 0859) Pulse Rate:  [54-95] 57 (03/11 0800) Resp:  [15-28] 24 (03/10 2300) BP: (79)/(57) 79/57 mmHg (03/10 1445) SpO2:  [85 %-100 %] 99 % (03/11 0826) Weight:  [99.4 kg (219 lb 2.2 oz)] 99.4 kg (219 lb 2.2 oz) (03/10 2200)   Mean arterial Pressure 70s  Intake/Output:   Intake/Output Summary (Last 24 hours) at 12/09/15 1020 Last data filed at 12/09/15 0800  Gross per 24 hour  Intake 1923.74 ml  Output    750 ml  Net 1173.74 ml     Physical Exam: General: Pale. Fatigued appearing. No resp difficulty HEENT: normal Neck: supple. JVP 6-7 . Carotids 2+ bilat; no bruits. No lymphadenopathy or thryomegaly appreciated. Cor: Mechanical heart sounds with LVAD hum present. Lungs: clear Abdomen: obese, soft, nontender, nondistended. No hepatosplenomegaly. No bruits or masses. Good bowel sounds. Driveline: C/D/I; securement device intact and driveline incorporated Extremities: no cyanosis, clubbing, rash, edema Neuro: alert & orientedx3, cranial nerves grossly intact. moves all 4 extremities w/o difficulty. Affect pleasant  Telemetry- Probable Atrial flutter 70s   Labs: Basic Metabolic Panel:  Recent Labs Lab 12/08/15 1311 12/09/15 0258  NA 139 139  K 4.3 4.0  CL 105 108  CO2 23 25  GLUCOSE 129* 108*  BUN 29* 26*  CREATININE 1.04 0.94  CALCIUM 10.0 9.1    Liver Function Tests:  Recent Labs Lab 12/09/15 0258  AST 36  ALT 17  ALKPHOS 39  BILITOT 0.4  PROT 5.4*  ALBUMIN 3.1*   No results for input(s): LIPASE, AMYLASE in the last 168 hours. No results for input(s): AMMONIA in the last 168 hours.  CBC:  Recent Labs Lab 12/05/15 0917 12/08/15 1311  12/09/15 0258  WBC 6.7 9.0 7.1  NEUTROABS 5.6  --  5.6  HGB 11.1* 9.7* 8.2*  HCT 37.3* 32.5* 27.3*  MCV 99.7 100.6* 101.1*  PLT 140* 166 153    INR:  Recent Labs Lab 12/05/15 0917 12/08/15 1630 12/09/15 0258  INR 2.22* 2.01* 1.98*    Other results:    Imaging: Dg Chest 2 View  12/08/2015  CLINICAL DATA:  Short of breath EXAM: CHEST  2 VIEW COMPARISON:  08/22/2015 FINDINGS: Prior CABG. Cardiac enlargement. Left ventricular assist device unchanged in position. Negative for heart failure or edema. Underlying COPD noted. Calcified pleural plaque in the lung bases bilaterally is unchanged and chronic. IMPRESSION: COPD.  Negative for heart failure or pneumonia. Electronically Signed   By: Franchot Gallo M.D.   On: 12/08/2015 13:31      Medications:     Scheduled Medications: . atorvastatin  40 mg Oral q1800  . cholecalciferol  1,000 Units Oral BID  . docusate sodium  200 mg Oral BID  . hydrocortisone  1 application Rectal BID  . levothyroxine  50 mcg Oral QHS  . mometasone-formoterol  2 puff Inhalation BID  . multivitamin  15 mL Oral Daily  . pantoprazole  40 mg Oral BID  . tiotropium  18 mcg Inhalation Daily  . vitamin B-12  1,000 mcg Oral QHS  . vitamin C  500  mg Oral BID  . warfarin  5 mg Oral ONCE-1800  . Warfarin - Pharmacist Dosing Inpatient   Does not apply q1800     Infusions: . amiodarone 30 mg/hr (12/09/15 0403)     PRN Medications:  furosemide, ipratropium-albuterol, nitroGLYCERIN, potassium chloride   Assessment:   1. Fatigue - likely due to GIB 2. Recurrent GIB     --H/O GI bleed.     --Enteroscopy 12/15 showed bleed from jejunum requiring 3 clips     -- Enteroscopy 2/17 normal 3. Recurrent AF/AFL with controlled rate 4. Symptomatic Anemia 5. Chronic Systolic HF: S/P LVAD for DT 12/2012  6. OSA  Plan/Discussion:    Appears to have recurrent GI bleed. INR now 1.98. Off ASA previously. Will likely need octreotide. Recent enteroscopy  normal. Will likely need repeat capsule endo (I have discussed with GI). Continue PPI. We have active Type and Screen. Follow CBC. Will give 1 u RBCs now. Continue coumadin today.   Remains in AFL. Now on IV amio. (was on po at home). Not candidate for DC-CV currently given active GI bleeding and instability with AC.   I reviewed the LVAD parameters from today, and compared the results to the patient's prior recorded data.  No programming changes were made.  The LVAD is functioning within specified parameters.  The patient performs LVAD self-test daily.  LVAD interrogation was negative for any significant power changes, alarms or PI events/speed drops.  LVAD equipment check completed and is in good working order.  Back-up equipment present.   LVAD education done on emergency procedures and precautions and reviewed exit site care.  Length of Stay: 1  Bensimhon, Daniel MD 12/09/2015, 10:20 AM  VAD Team --- VAD ISSUES ONLY--- Pager [phone] (7am - 7am)  Advanced Heart Failure Team  Pager [phone] (M-F; 7a - 4p)  Please contact [facility] for night-coverage after hours (4p -7a ) and weekends on [email or web address]

## 2015-12-09 NOTE — Progress Notes (Signed)
ANTICOAGULATION CONSULT NOTE - Initial Consult  Pharmacy Consult for Warfarin Indication: LVAD  No Known Allergies  Patient Measurements: Height: 6' (182.9 cm) Weight: 219 lb 2.2 oz (99.4 kg) IBW/kg (Calculated) : 77.6 Heparin Dosing Weight:   Vital Signs: Temp: 97.6 F (36.4 C) (03/11 0400) Temp Source: Oral (03/11 0400) Pulse Rate: 57 (03/11 0800)  Labs:  Recent Labs  12/08/15 1311 12/08/15 1630 12/09/15 0258  HGB 9.7*  --  8.2*  HCT 32.5*  --  27.3*  PLT 166  --  153  LABPROT  --  22.7* 22.4*  INR  --  2.01* 1.98*  CREATININE 1.04  --  0.94    Estimated Creatinine Clearance: 84.2 mL/min (by C-G formula based on Cr of 0.94).   Medical History: Past Medical History  Diagnosis Date  . Ischemic cardiomyopathy      CABG 2003, PCI 2007  EF 27%(myoview 2012  . Chronic systolic heart failure ([place])   . Hyperlipidemia   . Hypothyroidism   . Chronic anticoagulation     Afib and LVAD  . Obesity   . COPD (chronic obstructive pulmonary disease) ([place])   . Asbestosis(501)     "6 years in the [place]" (05/24/2013)  . Atrial fibrillation ([place])     permanent  . Paroxysmal ventricular tachycardia ([place])   . Coronary artery disease   . Hypertension   . Depression   . LVAD (left ventricular assist device) present ([place]) 12/2012  . Epistaxis 11/2015, 07/2014  . Cancer ([place])     "scraped some off behind my left ear; fast moving; having it cut out 12/05/2015" (11/07/2015)  . CHF (congestive heart failure) ([place])   . Myocardial infarction ([place]) 1990's-2000    "2 in  ~ the 1990's; 1 in ~ 2000" (11/07/2015)  . On home oxygen therapy     "have it available; don't use it" (11/07/2015)  . OSA on CPAP   . Anemia   . History of blood transfusion 08/2015 X 10; 11/2015 X 2    "related to bleeding on the inside somewhere" (11/07/2015)  . Complication of anesthesia     "they can't put me all the way to sleep cause of my heart" (11/07/2015)    Medications:  Prescriptions prior to admission   Medication Sig Dispense Refill Last Dose  . amiodarone (PACERONE) 200 MG tablet Take 2 tablets (400 mg total) by mouth daily. 90 tablet 3 12/08/2015 at Unknown time  . budesonide-formoterol (SYMBICORT) 160-4.5 MCG/ACT inhaler Inhale 2 puffs into the lungs 2 (two) times daily. (Patient taking differently: Inhale 2 puffs into the lungs daily. ) 1 Inhaler 3 12/08/2015 at Unknown time  . cholecalciferol (VITAMIN D) 1000 UNITS tablet Take 1 tablet (1,000 Units total) by mouth 2 (two) times daily. 180 tablet 3 12/08/2015 at Unknown time  . citalopram (CELEXA) 40 MG tablet Take 1 tablet (40 mg total) by mouth at bedtime. 90 tablet 3 12/07/2015 at Unknown time  . docusate sodium (COLACE) 100 MG capsule Take 200 mg by mouth 2 (two) times daily.   12/08/2015 at Unknown time  . hydrocortisone (ANUSOL-HC) 2.5 % rectal cream Place 1 application rectally 2 (two) times daily. 30 g 6 Past Week at Unknown time  . ipratropium-albuterol (DUONEB) 0.5-2.5 (3) MG/3ML SOLN Take 3 mLs by nebulization every 6 (six) hours as needed (copd).   12/08/2015 at Unknown time  . levothyroxine (SYNTHROID, LEVOTHROID) 100 MCG tablet Take 0.5 mcg by mouth at bedtime.   12/07/2015 at Unknown time  .  losartan (COZAAR) 100 MG tablet Take 1 tablet (100 mg total) by mouth at bedtime. 30 tablet 6 12/08/2015 at Unknown time  . Multiple Vitamins-Minerals (MULTIVITAMIN PO) Take 1 tablet by mouth daily.    12/08/2015 at Unknown time  . nitroGLYCERIN (NITROSTAT) 0.4 MG SL tablet Place 0.4 mg under the tongue every 5 (five) minutes as needed for chest pain.   rescue  . pantoprazole (PROTONIX) 40 MG tablet Take 1 tablet (40 mg total) by mouth 2 (two) times daily. 180 tablet 3 12/08/2015 at Unknown time  . simvastatin (ZOCOR) 80 MG tablet Take 0.5 tablets (40 mg total) by mouth at bedtime. 45 tablet 3 12/07/2015 at Unknown time  . tiotropium (SPIRIVA) 18 MCG inhalation capsule Place 1 capsule (18 mcg total) into inhaler and inhale daily. 90 capsule 3 12/08/2015 at  Unknown time  . vitamin B-12 (CYANOCOBALAMIN) 1000 MCG tablet Take 1,000 mcg by mouth at bedtime.   12/07/2015 at Unknown time  . vitamin C (ASCORBIC ACID) 500 MG tablet Take 1 tablet (500 mg total) by mouth 2 (two) times daily. 180 tablet 3 12/08/2015 at Unknown time  . warfarin (COUMADIN) 5 MG tablet Take 2.5 mg on Mon and Fri. All other days take 5 mg . On  6/21 take 2.5 mg. . (Patient taking differently: Take 2.5-5 mg by mouth daily at 6 PM. Takes 1/2 tab on Mon, Wed and Fri  Takes 1 tab all other days) 200 tablet 3 12/07/2015 at 2000  . furosemide (LASIX) 40 MG tablet Take 40 mg by mouth as needed for fluid. Reported on 11/21/2015   11/14/2015  . potassium chloride (K-DUR) 10 MEQ tablet Take 10 mEq by mouth as needed (when taking furosemide).   11/14/2015   Scheduled:  . atorvastatin  40 mg Oral q1800  . cholecalciferol  1,000 Units Oral BID  . docusate sodium  200 mg Oral BID  . hydrocortisone  1 application Rectal BID  . levothyroxine  50 mcg Oral QHS  . mometasone-formoterol  2 puff Inhalation BID  . multivitamin  15 mL Oral Daily  . pantoprazole  40 mg Oral BID  . tiotropium  18 mcg Inhalation Daily  . vitamin B-12  1,000 mcg Oral QHS  . vitamin C  500 mg Oral BID  . Warfarin - Pharmacist Dosing Inpatient   Does not apply q1800   Infusions:  . amiodarone 30 mg/hr (12/09/15 0403)    Assessment: 75yo male with history of LVAD presents with fatigue and atrial flutter. Pharmacy is consulted to dose warfarin for LVAD. INR 2.01 on admission.  INR today 1.98 (therapeutic, per patient specific goal of 1.8-2.5) Hgb 8.3, Plt wnl, sCr 0.94. No bleeding noted  PTA Warfarin Dose: 2.5mg  MWF and 5mg  AODs with last dose PTA 3/9  Goal of Therapy:  INR goal 1.8 - 2.5 per HF outpatient notes  Monitor platelets by anticoagulation protocol: Yes   Plan:  Warfarin 5mg  tonight x1 Daily INR Monitor s/sx of bleeding  Darl Pikes, PharmD Clinical Pharmacist- Resident Pager: [phone]    12/09/2015,8:16 AM

## 2015-12-10 DIAGNOSIS — I483 Typical atrial flutter: Secondary | ICD-10-CM

## 2015-12-10 LAB — CBC
HCT: 28.9 % — ABNORMAL LOW (ref 39.0–52.0)
HEMATOCRIT: 30.4 % — AB (ref 39.0–52.0)
HEMOGLOBIN: 8.8 g/dL — AB (ref 13.0–17.0)
Hemoglobin: 9.3 g/dL — ABNORMAL LOW (ref 13.0–17.0)
MCH: 30.2 pg (ref 26.0–34.0)
MCH: 30.2 pg (ref 26.0–34.0)
MCHC: 30.6 g/dL (ref 30.0–36.0)
MCHC: 30.4 g/dL (ref 30.0–36.0)
MCV: 98.7 fL (ref 78.0–100.0)
MCV: 99.3 fL (ref 78.0–100.0)
PLATELETS: 146 10*3/uL — AB (ref 150–400)
Platelets: 142 10*3/uL — ABNORMAL LOW (ref 150–400)
RBC: 3.08 MIL/uL — AB (ref 4.22–5.81)
RBC: 2.91 MIL/uL — ABNORMAL LOW (ref 4.22–5.81)
RDW: 20.5 % — AB (ref 11.5–15.5)
RDW: 20.5 % — AB (ref 11.5–15.5)
WBC: 8.2 10*3/uL (ref 4.0–10.5)
WBC: 7.5 10*3/uL (ref 4.0–10.5)

## 2015-12-10 LAB — COMPREHENSIVE METABOLIC PANEL
ALK PHOS: 40 U/L (ref 38–126)
ALT: 22 U/L (ref 17–63)
AST: 38 U/L (ref 15–41)
Albumin: 3.4 g/dL — ABNORMAL LOW (ref 3.5–5.0)
Anion gap: 9 (ref 5–15)
BILIRUBIN TOTAL: 0.6 mg/dL (ref 0.3–1.2)
BUN: 17 mg/dL (ref 6–20)
CALCIUM: 9.6 mg/dL (ref 8.9–10.3)
CHLORIDE: 104 mmol/L (ref 101–111)
CO2: 23 mmol/L (ref 22–32)
CREATININE: 0.94 mg/dL (ref 0.61–1.24)
Glucose, Bld: 119 mg/dL — ABNORMAL HIGH (ref 65–99)
Potassium: 4.2 mmol/L (ref 3.5–5.1)
Sodium: 136 mmol/L (ref 135–145)
TOTAL PROTEIN: 5.5 g/dL — AB (ref 6.5–8.1)

## 2015-12-10 LAB — PROTIME-INR
INR: 1.98 — ABNORMAL HIGH (ref 0.00–1.49)
Prothrombin Time: 22.4 seconds — ABNORMAL HIGH (ref 11.6–15.2)

## 2015-12-10 LAB — LACTATE DEHYDROGENASE: LDH: 263 U/L — AB (ref 98–192)

## 2015-12-10 MED ORDER — ONDANSETRON HCL 4 MG/2ML IJ SOLN
4.0000 mg | Freq: Once | INTRAMUSCULAR | Status: AC
  Administered 2015-12-10: 4 mg via INTRAVENOUS
  Filled 2015-12-10: qty 2

## 2015-12-10 MED ORDER — WARFARIN SODIUM 5 MG PO TABS
5.0000 mg | ORAL_TABLET | Freq: Once | ORAL | Status: AC
  Administered 2015-12-10: 5 mg via ORAL
  Filled 2015-12-10: qty 1

## 2015-12-10 NOTE — Progress Notes (Signed)
     [facility] Progress Note  Subjective:   Rec 1 unit prbcs yesterday. Hgb this morning 9.3.Had a dark BM this morning. No abd pain. No N/V.Feels very tired.    Objective:  Vital signs in last 24 hours: Temp:  [97.6 F (36.4 C)-98.4 F (36.9 C)] 97.6 F (36.4 C) (03/12 0755) Pulse Rate:  [56-76] 73 (03/12 1037) Resp:  [17-29] 21 (03/12 1037) SpO2:  [93 %-100 %] 93 % (03/12 1037) Weight:  [216 lb 8 oz (98.204 kg)] 216 lb 8 oz (98.204 kg) (03/12 0500)   General:   Alert,  Well-developed, pale, in NAD Heart: mechanical heart sounds with LVAD hum Pulm;lungs clear Abdomen:  Soft, nontender and nondistended. Normal bowel sounds, without guarding, and without rebound.   Extremities:  Without edema. Neurologic:  Alert and  oriented x4;  grossly normal neurologically. Psych:  Alert and cooperative. Normal mood and affect.  Intake/Output from previous day: 03/11 0701 - 03/12 0700 In: 3181.1 [P.O.:1080; I.V.:2101.1] Out: 1700 [Urine:1700] Intake/Output this shift: Total I/O In: 422.7 [P.O.:250; I.V.:172.7] Out: -   Lab Results:  Recent Labs  12/08/15 1311 12/09/15 0258 12/10/15 0521  WBC 9.0 7.1 8.2  HGB 9.7* 8.2* 9.3*  HCT 32.5* 27.3* 30.4*  PLT 166 153 146*   BMET  Recent Labs  12/08/15 1311 12/09/15 0258 12/10/15 0521  NA 139 139 136  K 4.3 4.0 4.2  CL 105 108 104  CO2 23 25 23   GLUCOSE 129* 108* 119*  BUN 29* 26* 17  CREATININE 1.04 0.94 0.94  CALCIUM 10.0 9.1 9.6   LFT  Recent Labs  12/10/15 0521  PROT 5.5*  ALBUMIN 3.4*  AST 38  ALT 22  ALKPHOS 40  BILITOT 0.6   PT/INR  Recent Labs  12/09/15 0258 12/10/15 0521  LABPROT 22.4* 22.4*  INR 1.98* 1.98*   Hepatitis Panel  ASSESSMENT/PLAN:  75 yo male with chronic systolic heart failure, s/p LVAD, admitted with recurrent GIB Enteroscopy 12/15 with bleed from jejunum requiring 3 clips. Enteroscopy Feb 2017 nl. For video capsule endoscopy tomorrow. Trend Hgb. Continue PPI.   LOS: 2 days   Hvozdovic, Vita Barley PA-C 12/10/2015, Pager [phone] Mon-Fri 8a-5p [phone] after 5p, weekends, holidays    [facility] Attending   I have taken an interval history, reviewed the chart and examined the patient. I agree with the Advanced Practitioner's note, impression and recommendations.    For capsule endo tomorrow to see if we can find a bleeding source.  Gatha Mayer, MD, [facility] [phone] (pager) [phone] after 5 PM, weekends and holidays  12/10/2015 4:02 PM

## 2015-12-10 NOTE — Progress Notes (Signed)
ANTICOAGULATION CONSULT NOTE - Initial Consult  Pharmacy Consult for Warfarin Indication: LVAD  No Known Allergies  Patient Measurements: Height: 6' (182.9 cm) Weight: 216 lb 8 oz (98.204 kg) IBW/kg (Calculated) : 77.6 Heparin Dosing Weight:   Vital Signs: Temp: 97.6 F (36.4 C) (03/12 0755) Temp Source: Oral (03/12 0755) Pulse Rate: 73 (03/12 0755)  Labs:  Recent Labs  12/08/15 1311 12/08/15 1630 12/09/15 0258 12/10/15 0521  HGB 9.7*  --  8.2* 9.3*  HCT 32.5*  --  27.3* 30.4*  PLT 166  --  153 146*  LABPROT  --  22.7* 22.4* 22.4*  INR  --  2.01* 1.98* 1.98*  CREATININE 1.04  --  0.94 0.94    Estimated Creatinine Clearance: 83.7 mL/min (by C-G formula based on Cr of 0.94).   Medical History: Past Medical History  Diagnosis Date  . Ischemic cardiomyopathy      CABG 2003, PCI 2007  EF 27%(myoview 2012  . Chronic systolic heart failure ([place])   . Hyperlipidemia   . Hypothyroidism   . Chronic anticoagulation     Afib and LVAD  . Obesity   . COPD (chronic obstructive pulmonary disease) ([place])   . Asbestosis(501)     "6 years in the [place]" (05/24/2013)  . Atrial fibrillation ([place])     permanent  . Paroxysmal ventricular tachycardia ([place])   . Coronary artery disease   . Hypertension   . Depression   . LVAD (left ventricular assist device) present ([place]) 12/2012  . Epistaxis 11/2015, 07/2014  . Cancer ([place])     "scraped some off behind my left ear; fast moving; having it cut out 12/05/2015" (11/07/2015)  . CHF (congestive heart failure) ([place])   . Myocardial infarction ([place]) 1990's-2000    "2 in  ~ the 1990's; 1 in ~ 2000" (11/07/2015)  . On home oxygen therapy     "have it available; don't use it" (11/07/2015)  . OSA on CPAP   . Anemia   . History of blood transfusion 08/2015 X 10; 11/2015 X 2    "related to bleeding on the inside somewhere" (11/07/2015)  . Complication of anesthesia     "they can't put me all the way to sleep cause of my heart" (11/07/2015)     Medications:  Prescriptions prior to admission  Medication Sig Dispense Refill Last Dose  . amiodarone (PACERONE) 200 MG tablet Take 2 tablets (400 mg total) by mouth daily. 90 tablet 3 12/08/2015 at Unknown time  . budesonide-formoterol (SYMBICORT) 160-4.5 MCG/ACT inhaler Inhale 2 puffs into the lungs 2 (two) times daily. (Patient taking differently: Inhale 2 puffs into the lungs daily. ) 1 Inhaler 3 12/08/2015 at Unknown time  . cholecalciferol (VITAMIN D) 1000 UNITS tablet Take 1 tablet (1,000 Units total) by mouth 2 (two) times daily. 180 tablet 3 12/08/2015 at Unknown time  . citalopram (CELEXA) 40 MG tablet Take 1 tablet (40 mg total) by mouth at bedtime. 90 tablet 3 12/07/2015 at Unknown time  . docusate sodium (COLACE) 100 MG capsule Take 200 mg by mouth 2 (two) times daily.   12/08/2015 at Unknown time  . hydrocortisone (ANUSOL-HC) 2.5 % rectal cream Place 1 application rectally 2 (two) times daily. 30 g 6 Past Week at Unknown time  . ipratropium-albuterol (DUONEB) 0.5-2.5 (3) MG/3ML SOLN Take 3 mLs by nebulization every 6 (six) hours as needed (copd).   12/08/2015 at Unknown time  . levothyroxine (SYNTHROID, LEVOTHROID) 100 MCG tablet Take 100 mcg by mouth at  bedtime.    12/07/2015 at Unknown time  . losartan (COZAAR) 100 MG tablet Take 1 tablet (100 mg total) by mouth at bedtime. 30 tablet 6 12/08/2015 at Unknown time  . Multiple Vitamins-Minerals (MULTIVITAMIN PO) Take 1 tablet by mouth daily.    12/08/2015 at Unknown time  . nitroGLYCERIN (NITROSTAT) 0.4 MG SL tablet Place 0.4 mg under the tongue every 5 (five) minutes as needed for chest pain.   rescue  . pantoprazole (PROTONIX) 40 MG tablet Take 1 tablet (40 mg total) by mouth 2 (two) times daily. 180 tablet 3 12/08/2015 at Unknown time  . simvastatin (ZOCOR) 80 MG tablet Take 0.5 tablets (40 mg total) by mouth at bedtime. 45 tablet 3 12/07/2015 at Unknown time  . tiotropium (SPIRIVA) 18 MCG inhalation capsule Place 1 capsule (18 mcg total)  into inhaler and inhale daily. 90 capsule 3 12/08/2015 at Unknown time  . vitamin B-12 (CYANOCOBALAMIN) 1000 MCG tablet Take 1,000 mcg by mouth at bedtime.   12/07/2015 at Unknown time  . vitamin C (ASCORBIC ACID) 500 MG tablet Take 1 tablet (500 mg total) by mouth 2 (two) times daily. 180 tablet 3 12/08/2015 at Unknown time  . warfarin (COUMADIN) 5 MG tablet Take 2.5 mg on Mon and Fri. All other days take 5 mg . On  6/21 take 2.5 mg. . (Patient taking differently: Take 2.5-5 mg by mouth daily at 6 PM. Takes 1/2 tab on Mon, Wed and Fri  Takes 1 tab all other days) 200 tablet 3 12/07/2015 at 2000  . furosemide (LASIX) 40 MG tablet Take 40 mg by mouth as needed for fluid. Reported on 11/21/2015   11/14/2015  . potassium chloride (K-DUR) 10 MEQ tablet Take 10 mEq by mouth as needed (when taking furosemide).   11/14/2015   Scheduled:  . atorvastatin  40 mg Oral q1800  . cholecalciferol  1,000 Units Oral BID  . docusate sodium  200 mg Oral BID  . hydrocortisone  1 application Rectal BID  . levothyroxine  100 mcg Oral QHS  . mometasone-formoterol  2 puff Inhalation BID  . multivitamin  15 mL Oral Daily  . pantoprazole  40 mg Oral BID  . peg 3350 powder  1 kit Oral Once  . tiotropium  18 mcg Inhalation Daily  . vitamin B-12  1,000 mcg Oral QHS  . vitamin C  500 mg Oral BID  . Warfarin - Pharmacist Dosing Inpatient   Does not apply q1800   Infusions:  . sodium chloride 10 mL/hr at 12/10/15 0000  . amiodarone 30 mg/hr (12/10/15 0437)    Assessment: 75yo male with history of LVAD presents with fatigue and atrial flutter. Pharmacy is consulted to dose warfarin for LVAD. INR 2.01 on admission. Pt appears to have recurrent GI bleed. Planned capsule endoscopy Monday  INR today 1.98 (therapeutic, per patient specific goal of 1.8-2.5) Hgb 9.3 (s/p 1 PRBC 3/11), Plt wnl, sCr 0.94.   PTA Warfarin Dose: 2.'5mg'$  MWF and '5mg'$  AODs with last dose PTA 3/9  Goal of Therapy:  INR goal 1.8 - 2.5 per HF outpatient  notes  Monitor platelets by anticoagulation protocol: Yes   Plan:  Warfarin '5mg'$  tonight x1 Daily INR/CBC Monitor s/sx of bleeding  Darl Pikes, PharmD Clinical Pharmacist- Resident Pager: [phone]   12/10/2015,8:21 AM

## 2015-12-10 NOTE — Progress Notes (Addendum)
Patient ID: [name], male   DOB: October 12, 1940, 75 y.o.   MRN: [id number]   HeartMate 2 Rounding Note  Subjective:     Tired.  Did not walk any yesterday.  1 BM this morning, was small and black.     Hgb 11.1 -> 9.7 -> 8.2 -> 1 unit PRBCs -> 9.3  LVAD INTERROGATION:  HeartMate II LVAD: Flow 5.7  liters/min, speed 9200, power 5.5, PI 5.3, 2 PI events.     Objective:    Vital Signs:   Temp:  [97.8 F (36.6 C)-98.4 F (36.9 C)] 98.2 F (36.8 C) (03/12 0320) Pulse Rate:  [56-76] 62 (03/12 0320) Resp:  [17-29] 23 (03/12 0400) SpO2:  [95 %-100 %] 98 % (03/12 0320) Weight:  [216 lb 8 oz (98.204 kg)] 216 lb 8 oz (98.204 kg) (03/12 0500)   Mean arterial Pressure 80s  Intake/Output:   Intake/Output Summary (Last 24 hours) at 12/10/15 0734 Last data filed at 12/10/15 0500  Gross per 24 hour  Intake 3164.43 ml  Output   1700 ml  Net 1464.43 ml     Physical Exam: General: Pale. Fatigued appearing. No resp difficulty HEENT: normal Neck: supple. JVP 6-7 . Carotids 2+ bilat; no bruits. No lymphadenopathy or thryomegaly appreciated. Cor: Mechanical heart sounds with LVAD hum present. Lungs: clear Abdomen: obese, soft, nontender, nondistended. No hepatosplenomegaly. No bruits or masses. Good bowel sounds. Driveline: C/D/I; securement device intact and driveline incorporated Extremities: no cyanosis, clubbing, rash, edema Neuro: alert & orientedx3, cranial nerves grossly intact. moves all 4 extremities w/o difficulty. Affect pleasant  Telemetry- Atrial fibrillation 70s-80s  Labs: Basic Metabolic Panel:  Recent Labs Lab 12/08/15 1311 12/09/15 0258 12/10/15 0521  NA 139 139 136  K 4.3 4.0 4.2  CL 105 108 104  CO2 '23 25 23  '$ GLUCOSE 129* 108* 119*  BUN 29* 26* 17  CREATININE 1.04 0.94 0.94  CALCIUM 10.0 9.1 9.6    Liver Function Tests:  Recent Labs Lab 12/09/15 0258 12/10/15 0521  AST 36 38  ALT 17 22  ALKPHOS 39 40  BILITOT 0.4 0.6  PROT 5.4* 5.5*    ALBUMIN 3.1* 3.4*   No results for input(s): LIPASE, AMYLASE in the last 168 hours. No results for input(s): AMMONIA in the last 168 hours.  CBC:  Recent Labs Lab 12/05/15 0917 12/08/15 1311 12/09/15 0258 12/10/15 0521  WBC 6.7 9.0 7.1 8.2  NEUTROABS 5.6  --  5.6  --   HGB 11.1* 9.7* 8.2* 9.3*  HCT 37.3* 32.5* 27.3* 30.4*  MCV 99.7 100.6* 101.1* 98.7  PLT 140* 166 153 146*    INR:  Recent Labs Lab 12/05/15 0917 12/08/15 1630 12/09/15 0258 12/10/15 0521  INR 2.22* 2.01* 1.98* 1.98*    Other results:    Imaging: Dg Chest 2 View  12/08/2015  CLINICAL DATA:  Short of breath EXAM: CHEST  2 VIEW COMPARISON:  08/22/2015 FINDINGS: Prior CABG. Cardiac enlargement. Left ventricular assist device unchanged in position. Negative for heart failure or edema. Underlying COPD noted. Calcified pleural plaque in the lung bases bilaterally is unchanged and chronic. IMPRESSION: COPD.  Negative for heart failure or pneumonia. Electronically Signed   By: Franchot Gallo M.D.   On: 12/08/2015 13:31     Medications:     Scheduled Medications: . atorvastatin  40 mg Oral q1800  . cholecalciferol  1,000 Units Oral BID  . docusate sodium  200 mg Oral BID  . hydrocortisone  1 application Rectal BID  .  levothyroxine  100 mcg Oral QHS  . mometasone-formoterol  2 puff Inhalation BID  . multivitamin  15 mL Oral Daily  . pantoprazole  40 mg Oral BID  . peg 3350 powder  1 kit Oral Once  . tiotropium  18 mcg Inhalation Daily  . vitamin B-12  1,000 mcg Oral QHS  . vitamin C  500 mg Oral BID  . Warfarin - Pharmacist Dosing Inpatient   Does not apply q1800    Infusions: . sodium chloride 10 mL/hr at 12/10/15 0000  . amiodarone 30 mg/hr (12/10/15 0437)    PRN Medications: furosemide, ipratropium-albuterol, nitroGLYCERIN, potassium chloride   Assessment:   1. Fatigue - likely due to GIB 2. Recurrent GIB     -- H/O GI bleed.     -- Enteroscopy 12/15 showed bleed from jejunum  requiring 3 clips     -- Enteroscopy 2/17 normal 3. Recurrent AF/AFL with controlled rate 4. Symptomatic Anemia 5. Chronic Systolic HF: S/P LVAD for DT 12/2012  6. OSA  Plan/Discussion:    Appears to have recurrent GI bleed. INR now 1.98. Off ASA previously. Recent enteroscopy normal. 1 unit PRBCs 3/11 with appropriate hemoglobin bump.  Small melanotic stool this morning.  - Will have capsule endoscopy Monday.  - Continue PPI.  - Repeat CBC this afternoon, transfuse hgb < 8.  - Clear liquids for now.  - Will need to work on getting him octreotide.  This may be difficult as his meds come through the [place].  In atrial fibrillation. Now on IV amiodarone (was on po at home). Not candidate for DC-CV currently given active GI bleeding and instability with AC.   I reviewed the LVAD parameters from today, and compared the results to the patient's prior recorded data.  No programming changes were made.  The LVAD is functioning within specified parameters.  The patient performs LVAD self-test daily.  LVAD interrogation was negative for any significant power changes, alarms or PI events/speed drops.  LVAD equipment check completed and is in good working order.  Back-up equipment present.   LVAD education done on emergency procedures and precautions and reviewed exit site care.  Length of Stay: 2  Loralie Champagne MD 12/10/2015, 7:34 AM  VAD Team --- VAD ISSUES ONLY--- Pager [phone] (7am - 7am)  Advanced Heart Failure Team  Pager [phone] (M-F; 7a - 4p)  Please contact [facility] for night-coverage after hours (4p -7a ) and weekends on [email or web address]

## 2015-12-11 ENCOUNTER — Encounter (HOSPITAL_COMMUNITY)
Admission: EM | Disposition: A | Discharge status: Home / Self Care | Payer: Self-pay | Source: Home / Self Care | Attending: Internal Medicine

## 2015-12-11 ENCOUNTER — Encounter (HOSPITAL_COMMUNITY): Payer: Self-pay | Admitting: Internal Medicine

## 2015-12-11 DIAGNOSIS — D649 Anemia, unspecified: Secondary | ICD-10-CM

## 2015-12-11 DIAGNOSIS — K921 Melena: Principal | ICD-10-CM

## 2015-12-11 HISTORY — PX: GIVENS CAPSULE STUDY: SHX5432

## 2015-12-11 LAB — COMPREHENSIVE METABOLIC PANEL
ALT: 22 U/L (ref 17–63)
AST: 37 U/L (ref 15–41)
Albumin: 3 g/dL — ABNORMAL LOW (ref 3.5–5.0)
Alkaline Phosphatase: 34 U/L — ABNORMAL LOW (ref 38–126)
Anion gap: 8 (ref 5–15)
BILIRUBIN TOTAL: 0.9 mg/dL (ref 0.3–1.2)
BUN: 17 mg/dL (ref 6–20)
CO2: 25 mmol/L (ref 22–32)
CREATININE: 0.85 mg/dL (ref 0.61–1.24)
Calcium: 9.2 mg/dL (ref 8.9–10.3)
Chloride: 104 mmol/L (ref 101–111)
GFR calc Af Amer: >60 mL/min (ref >60)
Glucose, Bld: 109 mg/dL — ABNORMAL HIGH (ref 65–99)
Potassium: 3.8 mmol/L (ref 3.5–5.1)
Sodium: 137 mmol/L (ref 135–145)
TOTAL PROTEIN: 5.2 g/dL — AB (ref 6.5–8.1)

## 2015-12-11 LAB — CBC
HCT: 27 % — ABNORMAL LOW (ref 39.0–52.0)
HEMATOCRIT: 26.6 % — AB (ref 39.0–52.0)
HEMOGLOBIN: 8 g/dL — AB (ref 13.0–17.0)
Hemoglobin: 8.4 g/dL — ABNORMAL LOW (ref 13.0–17.0)
MCH: 30.9 pg (ref 26.0–34.0)
MCH: 29.9 pg (ref 26.0–34.0)
MCHC: 31.1 g/dL (ref 30.0–36.0)
MCHC: 30.1 g/dL (ref 30.0–36.0)
MCV: 99.3 fL (ref 78.0–100.0)
MCV: 99.3 fL (ref 78.0–100.0)
PLATELETS: 149 10*3/uL — AB (ref 150–400)
Platelets: 152 10*3/uL (ref 150–400)
RBC: 2.72 MIL/uL — ABNORMAL LOW (ref 4.22–5.81)
RBC: 2.68 MIL/uL — ABNORMAL LOW (ref 4.22–5.81)
RDW: 20.6 % — ABNORMAL HIGH (ref 11.5–15.5)
RDW: 20.5 % — AB (ref 11.5–15.5)
WBC: 7.5 10*3/uL (ref 4.0–10.5)
WBC: 7.5 10*3/uL (ref 4.0–10.5)

## 2015-12-11 LAB — LACTATE DEHYDROGENASE: LDH: 237 U/L — ABNORMAL HIGH (ref 98–192)

## 2015-12-11 LAB — PROTIME-INR
INR: 2.61 — ABNORMAL HIGH (ref 0.00–1.49)
Prothrombin Time: 27.6 seconds — ABNORMAL HIGH (ref 11.6–15.2)

## 2015-12-11 LAB — PREPARE RBC (CROSSMATCH)

## 2015-12-11 SURGERY — IMAGING PROCEDURE, GI TRACT, INTRALUMINAL, VIA CAPSULE
Anesthesia: LOCAL

## 2015-12-11 MED ORDER — AMIODARONE HCL 200 MG PO TABS
400.0000 mg | ORAL_TABLET | Freq: Two times a day (BID) | ORAL | Status: DC
  Administered 2015-12-11 – 2015-12-14 (×7): 400 mg via ORAL
  Filled 2015-12-11 (×7): qty 2

## 2015-12-11 MED ORDER — SODIUM CHLORIDE 0.9 % IV SOLN
INTRAVENOUS | Status: DC
  Administered 2015-12-11: 04:00:00 via INTRAVENOUS

## 2015-12-11 MED ORDER — SODIUM CHLORIDE 0.9 % IV SOLN
Freq: Once | INTRAVENOUS | Status: AC
  Administered 2015-12-11: 18:00:00 via INTRAVENOUS

## 2015-12-11 SURGICAL SUPPLY — 1 items: TOWEL COTTON PACK 4EA (MISCELLANEOUS) ×4 IMPLANT

## 2015-12-11 NOTE — Progress Notes (Signed)
Patient ID: MYRL ANNESE, male   DOB: [birth year], 75 y.o.   MRN: [id number]   HeartMate 2 Rounding Note  Subjective:    Feeling better.  1 BM this morning.   Hgb 11.1 -> 9.7 -> 8.2 -> 1 unit PRBCs -> 9.3--> 8.4   LVAD INTERROGATION:  HeartMate II LVAD: Flow 5.3  liters/min, speed 9200, power 5.5, PI 5.5. No PI events.      Objective:    Vital Signs:   Temp:  [97.6 F (36.4 C)-98.1 F (36.7 C)] 98 F (36.7 C) (03/13 0315) Pulse Rate:  [73] 73 (03/12 1600) Resp:  [17-23] 20 (03/12 2032) SpO2:  [93 %-98 %] 95 % (03/13 0400) Weight:  [215 lb 9.6 oz (97.796 kg)] 215 lb 9.6 oz (97.796 kg) (03/13 0500)   Mean arterial Pressure 80s  Intake/Output:   Intake/Output Summary (Last 24 hours) at 12/11/15 0711 Last data filed at 12/11/15 0600  Gross per 24 hour  Intake 2170.93 ml  Output    800 ml  Net 1370.93 ml     Physical Exam: General: Pale. Fatigued appearing. No resp difficulty. In bed.  HEENT: normal. L neck dressing CDI Neck: supple. JVP 6-7 . Carotids 2+ bilat; no bruits. No lymphadenopathy or thryomegaly appreciated. Cor: Mechanical heart sounds with LVAD hum present. Lungs: clear Abdomen: obese, soft, nontender, nondistended. No hepatosplenomegaly. No bruits or masses. Good bowel sounds. Driveline: C/D/I; securement device intact and driveline incorporated Extremities: no cyanosis, clubbing, rash, edema Neuro: alert & orientedx3, cranial nerves grossly intact. moves all 4 extremities w/o difficulty. Affect pleasant  Telemetry- NSR 60s with PVCs.   Labs: Basic Metabolic Panel:  Recent Labs Lab 12/08/15 1311 12/09/15 0258 12/10/15 0521 12/11/15 0216  NA 139 139 136 137  K 4.3 4.0 4.2 3.8  CL 105 108 104 104  CO2 23 25 23 25   GLUCOSE 129* 108* 119* 109*  BUN 29* 26* 17 17  CREATININE 1.04 0.94 0.94 0.85  CALCIUM 10.0 9.1 9.6 9.2    Liver Function Tests:  Recent Labs Lab 12/09/15 0258 12/10/15 0521 12/11/15 0216  AST 36 38 37  ALT 17 22 22     ALKPHOS 39 40 34*  BILITOT 0.4 0.6 0.9  PROT 5.4* 5.5* 5.2*  ALBUMIN 3.1* 3.4* 3.0*   No results for input(s): LIPASE, AMYLASE in the last 168 hours. No results for input(s): AMMONIA in the last 168 hours.  CBC:  Recent Labs Lab 12/05/15 0917 12/08/15 1311 12/09/15 0258 12/10/15 0521 12/10/15 1450 12/11/15 0216  WBC 6.7 9.0 7.1 8.2 7.5 7.5  NEUTROABS 5.6  --  5.6  --   --   --   HGB 11.1* 9.7* 8.2* 9.3* 8.8* 8.4*  HCT 37.3* 32.5* 27.3* 30.4* 28.9* 27.0*  MCV 99.7 100.6* 101.1* 98.7 99.3 99.3  PLT 140* 166 153 146* 142* 149*    INR:  Recent Labs Lab 12/05/15 0917 12/08/15 1630 12/09/15 0258 12/10/15 0521 12/11/15 0216  INR 2.22* 2.01* 1.98* 1.98* 2.61*    Other results:    Imaging: No results found.   Medications:     Scheduled Medications: . atorvastatin  40 mg Oral q1800  . cholecalciferol  1,000 Units Oral BID  . docusate sodium  200 mg Oral BID  . hydrocortisone  1 application Rectal BID  . levothyroxine  100 mcg Oral QHS  . mometasone-formoterol  2 puff Inhalation BID  . multivitamin  15 mL Oral Daily  . pantoprazole  40 mg Oral BID  .  tiotropium  18 mcg Inhalation Daily  . vitamin B-12  1,000 mcg Oral QHS  . vitamin C  500 mg Oral BID  . Warfarin - Pharmacist Dosing Inpatient   Does not apply q1800    Infusions: . sodium chloride 20 mL/hr at 12/11/15 0414  . sodium chloride Stopped (12/11/15 0414)  . amiodarone 30 mg/hr (12/10/15 1900)    PRN Medications: furosemide, ipratropium-albuterol, nitroGLYCERIN, potassium chloride   Assessment:   1. Fatigue - likely due to GIB 2. Recurrent GIB     -- H/O GI bleed.     -- Enteroscopy 12/15 showed bleed from jejunum requiring 3 clips     -- Enteroscopy 2/17 normal 3. Recurrent AF/AFL with controlled rate 4. Symptomatic Anemia 5. Chronic Systolic HF: S/P LVAD for DT 12/2012  6. OSA  Plan/Discussion:    Appears to have recurrent GI bleed. INR now 2.61  Off ASA previously. Recent  enteroscopy normal. 1 unit PRBCs 3/11 with appropriate hemoglobin bump.  Black stool this morning.  - Will have capsule endoscopy today.   - Continue PPI.  - Todays hemoglobin 8.4   - Clear liquids for now.  - Will need to work on getting him octreotide.  This may be difficult as his meds come through the [place].  Converted to NSR. Transition to amio 400 mg twice a day.    I reviewed the LVAD parameters from today, and compared the results to the patient's prior recorded data.  No programming changes were made.  The LVAD is functioning within specified parameters.  The patient performs LVAD self-test daily.  LVAD interrogation was negative for any significant power changes, alarms or PI events/speed drops.  LVAD equipment check completed and is in good working order.  Back-up equipment present.   LVAD education done on emergency procedures and precautions and reviewed exit site care.  Length of Stay: 3  Amy Clegg NP-C  12/11/2015, 7:11 AM  VAD Team --- VAD ISSUES ONLY--- Pager [phone] (7am - 7am)  Advanced Heart Failure Team  Pager [phone] (M-F; 7a - 4p)  Please contact [facility] for night-coverage after hours (4p -7a ) and weekends on [email or web address]  Patient seen and examined with [name], NP. We discussed all aspects of the encounter. I agree with the assessment and plan as stated above.   Multiple black tarry stools last night. Hgb drifting down. Feels weak. Capsule study now underway. Will transfuse another unit of RBCs. Continue PPI. Will continue coumadin for now. No ASA. Will continue to work on trying to get octreotide as outpatient.  Back in NSR. Can convert amio to po.   MAPs and VAD parameters are stable. Volume status looks ok.   Glori Bickers MD
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
## 2015-12-11 NOTE — Progress Notes (Signed)
1030 Came to see to offer to walk with pt. Pt declined stating did not feel like it and has not felt well since lesion removed from neck. State he will walk with me tomorrow after capsule study completed and he can eat. Will follow up tomorrow. Social visit with pt.Graylon Good RN BSN 12/11/2015 10:56 AM

## 2015-12-11 NOTE — Progress Notes (Signed)
Daily Rounding Note  12/11/2015, 8:42 AM  LOS: 3 days   SUBJECTIVE:       Pt feels better.  Heart rhythm is back to NSR which alleviates profound DOE.  Capsule study in progress since this AM.  OBJECTIVE:         Vital signs in last 24 hours:    Temp:  [97.8 F (36.6 C)-98.6 F (37 C)] 98.6 F (37 C) (03/13 0759) Pulse Rate:  [73] 73 (03/12 1600) Resp:  [16-23] 16 (03/13 0759) SpO2:  [93 %-97 %] 97 % (03/13 0837) Weight:  [97.796 kg (215 lb 9.6 oz)] 97.796 kg (215 lb 9.6 oz) (03/13 0500)   Filed Weights   12/08/15 2200 12/10/15 0500 12/11/15 0500  Weight: 99.4 kg (219 lb 2.2 oz) 98.204 kg (216 lb 8 oz) 97.796 kg (215 lb 9.6 oz)   General: looks chronically ill.  Comfortable, no acute distress3   Heart: NSR on monitor.  VAD hum on exam. Chest: clear but diminished BS.  No labored breathing or cough Abdomen: soft, NT, ND.  Active BS.  Drive line site on right: benign.   Extremities: no CCE.   Neuro/Psych:  Pleasant, alert, no gross deficits.  Cooperative, relaxed.   Intake/Output from previous day: 03/12 0701 - 03/13 0700 In: 2207.6 [P.O.:1500; I.V.:707.6] Out: 800 [Urine:800]  Intake/Output this shift: Total I/O In: 34.8 [I.V.:34.8] Out: -   Lab Results:  Recent Labs  12/10/15 0521 12/10/15 1450 12/11/15 0216  WBC 8.2 7.5 7.5  HGB 9.3* 8.8* 8.4*  HCT 30.4* 28.9* 27.0*  PLT 146* 142* 149*   BMET  Recent Labs  12/09/15 0258 12/10/15 0521 12/11/15 0216  NA 139 136 137  K 4.0 4.2 3.8  CL 108 104 104  CO2 25 23 25   GLUCOSE 108* 119* 109*  BUN 26* 17 17  CREATININE 0.94 0.94 0.85  CALCIUM 9.1 9.6 9.2   LFT  Recent Labs  12/09/15 0258 12/10/15 0521 12/11/15 0216  PROT 5.4* 5.5* 5.2*  ALBUMIN 3.1* 3.4* 3.0*  AST 36 38 37  ALT 17 22 22   ALKPHOS 39 40 34*  BILITOT 0.4 0.6 0.9   PT/INR  Recent Labs  12/10/15 0521 12/11/15 0216  LABPROT 22.4* 27.6*  INR 1.98* 2.61*   Hepatitis  Panel No results for input(s): HEPBSAG, HCVAB, HEPAIGM, HEPBIGM in the last 72 hours.  Studies/Results: No results found.   Scheduled Meds: . amiodarone  400 mg Oral BID  . atorvastatin  40 mg Oral q1800  . cholecalciferol  1,000 Units Oral BID  . docusate sodium  200 mg Oral BID  . hydrocortisone  1 application Rectal BID  . levothyroxine  100 mcg Oral QHS  . mometasone-formoterol  2 puff Inhalation BID  . multivitamin  15 mL Oral Daily  . pantoprazole  40 mg Oral BID  . tiotropium  18 mcg Inhalation Daily  . vitamin B-12  1,000 mcg Oral QHS  . vitamin C  500 mg Oral BID  . Warfarin - Pharmacist Dosing Inpatient   Does not apply q1800   Continuous Infusions: . sodium chloride 20 mL/hr at 12/11/15 0414  . sodium chloride Stopped (12/11/15 0414)   PRN Meds:.furosemide, ipratropium-albuterol, nitroGLYCERIN, potassium chloride  ASSESMENT:   *  GIB.  FOBT + without melena or blood PR Multiple endoscopic studies not limited to:  09/27/2014 enteroscopy: jejunal bleeding, APC ablation and clips x 3.  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  11/09/2015 enteroscopy: normal  Capsule endo today.   *  Blood loss anemia.  S/p PRBC x 1.   *  Thrombocytopenia.   *  End stage heart failure.  S/p LVAD.  S/p ICD.  Chronic Coumadin, last dose 1700 on 3/12.    PLAN   *  Complete capsule endo later today. Hopefully can be read on 3/14. Slowly advance diet to solids over course of next several hours.     [name]  12/11/2015, 8:42 AM Pager: [phone]

## 2015-12-12 LAB — PROTIME-INR
INR: 2.85 — ABNORMAL HIGH (ref 0.00–1.49)
Prothrombin Time: 29.5 seconds — ABNORMAL HIGH (ref 11.6–15.2)

## 2015-12-12 LAB — BASIC METABOLIC PANEL
Anion gap: 9 (ref 5–15)
BUN: 18 mg/dL (ref 6–20)
CALCIUM: 9.8 mg/dL (ref 8.9–10.3)
CHLORIDE: 103 mmol/L (ref 101–111)
CO2: 26 mmol/L (ref 22–32)
Creatinine, Ser: 1.14 mg/dL (ref 0.61–1.24)
GLUCOSE: 112 mg/dL — AB (ref 65–99)
Potassium: 3.6 mmol/L (ref 3.5–5.1)
Sodium: 138 mmol/L (ref 135–145)

## 2015-12-12 LAB — TYPE AND SCREEN
ABO/RH(D): O POS
Antibody Screen: NEGATIVE
UNIT DIVISION: 0
UNIT DIVISION: 0

## 2015-12-12 LAB — CBC
HEMATOCRIT: 31.7 % — AB (ref 39.0–52.0)
HEMOGLOBIN: 9.6 g/dL — AB (ref 13.0–17.0)
MCH: 29.8 pg (ref 26.0–34.0)
MCHC: 30.3 g/dL (ref 30.0–36.0)
MCV: 98.4 fL (ref 78.0–100.0)
Platelets: 179 10*3/uL (ref 150–400)
RBC: 3.22 MIL/uL — ABNORMAL LOW (ref 4.22–5.81)
RDW: 21.8 % — ABNORMAL HIGH (ref 11.5–15.5)
WBC: 9 10*3/uL (ref 4.0–10.5)

## 2015-12-12 LAB — LACTATE DEHYDROGENASE: LDH: 254 U/L — AB (ref 98–192)

## 2015-12-12 MED ORDER — ADULT MULTIVITAMIN W/MINERALS CH
1.0000 | ORAL_TABLET | Freq: Every day | ORAL | Status: DC
  Administered 2015-12-13 – 2015-12-14 (×2): 1 via ORAL
  Filled 2015-12-12 (×2): qty 1

## 2015-12-12 NOTE — Progress Notes (Signed)
Patient ID: [name], male   DOB: 1941/02/19, 75 y.o.   MRN: [id number]   HeartMate 2 Rounding Note  Subjective:   Denies SOB/Orthopnea. 4 black BMs yesterday  Yesterday hgb was 8.0--> Received 1UPRBCs -->todays hemoglobin is 9.6   LVAD INTERROGATION:  HeartMate II LVAD: Flow 5.6  liters/min, speed 9200, power 6, PI 4.8. No PI events.      Objective:    Vital Signs:   Temp:  [97.6 F (36.4 C)-98.7 F (37.1 C)] 98.5 F (36.9 C) (03/13 2323) Pulse Rate:  [63-74] 74 (03/13 2115) Resp:  [15-18] 18 (03/13 2115) SpO2:  [89 %-97 %] 95 % (03/14 0000) Weight:  [214 lb 1.6 oz (97.115 kg)] 214 lb 1.6 oz (97.115 kg) (03/14 0708)   Mean arterial Pressure 70s  Intake/Output:   Intake/Output Summary (Last 24 hours) at 12/12/15 0714 Last data filed at 12/11/15 2110  Gross per 24 hour  Intake 669.75 ml  Output    250 ml  Net 419.75 ml     Physical Exam: General: NAD.  No resp difficulty. In bed.  HEENT: normal. L neck dressing CDI Neck: supple. JVP 6-7 . Carotids 2+ bilat; no bruits. No lymphadenopathy or thryomegaly appreciated. Cor: Mechanical heart sounds with LVAD hum present. Lungs: clear Abdomen: obese, soft, nontender, nondistended. No hepatosplenomegaly. No bruits or masses. Good bowel sounds. Driveline: C/D/I; securement device intact and driveline incorporated Extremities: no cyanosis, clubbing, rash, edema Neuro: alert & orientedx3, cranial nerves grossly intact. moves all 4 extremities w/o difficulty. Affect pleasant  Telemetry- NSR 60s with PVCs.   Labs: Basic Metabolic Panel:  Recent Labs Lab 12/08/15 1311 12/09/15 0258 12/10/15 0521 12/11/15 0216 12/12/15 0231  NA 139 139 136 137 138  K 4.3 4.0 4.2 3.8 3.6  CL 105 108 104 104 103  CO2 23 25 23 25 26   GLUCOSE 129* 108* 119* 109* 112*  BUN 29* 26* 17 17 18   CREATININE 1.04 0.94 0.94 0.85 1.14  CALCIUM 10.0 9.1 9.6 9.2 9.8    Liver Function Tests:  Recent Labs Lab 12/09/15 0258  12/10/15 0521 12/11/15 0216  AST 36 38 37  ALT 17 22 22   ALKPHOS 39 40 34*  BILITOT 0.4 0.6 0.9  PROT 5.4* 5.5* 5.2*  ALBUMIN 3.1* 3.4* 3.0*   No results for input(s): LIPASE, AMYLASE in the last 168 hours. No results for input(s): AMMONIA in the last 168 hours.  CBC:  Recent Labs Lab 12/05/15 0917  12/09/15 0258 12/10/15 0521 12/10/15 1450 12/11/15 0216 12/11/15 1342 12/12/15 0231  WBC 6.7  < > 7.1 8.2 7.5 7.5 7.5 9.0  NEUTROABS 5.6  --  5.6  --   --   --   --   --   HGB 11.1*  < > 8.2* 9.3* 8.8* 8.4* 8.0* 9.6*  HCT 37.3*  < > 27.3* 30.4* 28.9* 27.0* 26.6* 31.7*  MCV 99.7  < > 101.1* 98.7 99.3 99.3 99.3 98.4  PLT 140*  < > 153 146* 142* 149* 152 179  < > = values in this interval not displayed.  INR:  Recent Labs Lab 12/08/15 1630 12/09/15 0258 12/10/15 0521 12/11/15 0216 12/12/15 0231  INR 2.01* 1.98* 1.98* 2.61* 2.85*    Other results:    Imaging: No results found.   Medications:     Scheduled Medications: . amiodarone  400 mg Oral BID  . atorvastatin  40 mg Oral q1800  . cholecalciferol  1,000 Units Oral BID  . docusate sodium  200 mg Oral BID  . hydrocortisone  1 application Rectal BID  . levothyroxine  100 mcg Oral QHS  . mometasone-formoterol  2 puff Inhalation BID  . multivitamin  15 mL Oral Daily  . pantoprazole  40 mg Oral BID  . tiotropium  18 mcg Inhalation Daily  . vitamin B-12  1,000 mcg Oral QHS  . vitamin C  500 mg Oral BID  . Warfarin - Pharmacist Dosing Inpatient   Does not apply q1800    Infusions: . sodium chloride Stopped (12/11/15 0414)    PRN Medications: furosemide, ipratropium-albuterol, nitroGLYCERIN, potassium chloride   Assessment:   1. Fatigue - likely due to GIB 2. Recurrent GIB     -- H/O GI bleed.     -- Enteroscopy 12/15 showed bleed from jejunum requiring 3 clips     -- Enteroscopy 2/17 normal 3. Recurrent AF/AFL with controlled rate 4. Symptomatic Anemia 5. Chronic Systolic HF: S/P LVAD for DT  12/2012  6. OSA  Plan/Discussion:    Appears to have recurrent GI bleed. INR now 2.85  Off ASA previously. Recent enteroscopy normal. 1 unit PRBCs 3/11 with appropriate hemoglobin bump. Received 1UPRBC 3/13 with appropriate rise from 8.0>9.6 .   - Capsule endoscopy results pending.    - Continue PPI.  - Will need to work on getting him octreotide.  This may be difficult as his meds come through the [place].  Remains in NSR. Transition to amio 400 mg twice a day.    Wants to move to 2W. Consider transfer today.  I reviewed the LVAD parameters from today, and compared the results to the patient's prior recorded data.  No programming changes were made.  The LVAD is functioning within specified parameters.  The patient performs LVAD self-test daily.  LVAD interrogation was negative for any significant power changes, alarms or PI events/speed drops.  LVAD equipment check completed and is in good working order.  Back-up equipment present.   LVAD education done on emergency procedures and precautions and reviewed exit site care.  Length of Stay: 4  Amy Clegg NP-C  12/12/2015, 7:14 AM  VAD Team --- VAD ISSUES ONLY--- Pager [phone] (7am - 7am)  Advanced Heart Failure Team  Pager [phone] (M-F; 7a - 4p)  Please contact [facility] for night-coverage after hours (4p -7a ) and weekends on [email or web address]  Patient seen and examined with [name], NP. We discussed all aspects of the encounter. I agree with the assessment and plan as stated above.   He continues with ongoing melena. 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 GI planning repeat enteroscopy tomorrow.   If cannot get octreotide may be reasonable to eventually consider switching warfarin to apixaban and see if this helps reduce frequency of bleeding episodes.   Remains in NSR. Continue amio. MAPs and VAD parameters ok.   Bensimhon, Daniel,MD 4:53 PM

## 2015-12-12 NOTE — Progress Notes (Signed)
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 Pt says stools are/have been black.  He feels ok  OBJECTIVE:         Vital signs in last 24 hours:    Temp:  [97.5 F (36.4 C)-98.7 F (37.1 C)] 97.5 F (36.4 C) (03/14 1230) Pulse Rate:  [67-74] 67 (03/14 0948) Resp:  [16-18] 18 (03/14 0948) SpO2:  [94 %-96 %] 94 % (03/14 0948) Weight:  [97.115 kg (214 lb 1.6 oz)] 97.115 kg (214 lb 1.6 oz) (03/14 0708)   Filed Weights   12/10/15 0500 12/11/15 0500 12/12/15 0708  Weight: 98.204 kg (216 lb 8 oz) 97.796 kg (215 lb 9.6 oz) 97.115 kg (214 lb 1.6 oz)   General: looks chronically ill   Heart: vad hum Chest: clear bil Abdomen: somewhat protuberant but soft and NT.  Active BS.  driveline site benign  Extremities: no CCE Neuro/Psych:  Oriented x 3.  No gross deficits.   Intake/Output from previous day: 03/13 0701 - 03/14 0700 In: 669.8 [P.O.:300; I.V.:34.8; Blood:335] Out: 250 [Urine:250]  Intake/Output this shift: Total I/O In: 710 [P.O.:710] Out: 250 [Urine:250]  Lab Results:  Recent Labs  12/11/15 0216 12/11/15 1342 12/12/15 0231  WBC 7.5 7.5 9.0  HGB 8.4* 8.0* 9.6*  HCT 27.0* 26.6* 31.7*  PLT 149* 152 179   BMET  Recent Labs  12/10/15 0521 12/11/15 0216 12/12/15 0231  NA 136 137 138  K 4.2 3.8 3.6  CL 104 104 103  CO2 23 25 26   GLUCOSE 119* 109* 112*  BUN 17 17 18   CREATININE 0.94 0.85 1.14  CALCIUM 9.6 9.2 9.8   LFT  Recent Labs  12/10/15 0521 12/11/15 0216  PROT 5.5* 5.2*  ALBUMIN 3.4* 3.0*  AST 38 37  ALT 22 22  ALKPHOS 40 34*  BILITOT 0.6 0.9   PT/INR  Recent Labs  12/11/15 0216 12/12/15 0231  LABPROT 27.6* 29.5*  INR 2.61* 2.85*   Hepatitis Panel No results for input(s): HEPBSAG, HCVAB, HEPAIGM, HEPBIGM in the last 72  hours.  Studies/Results: No results found.  Scheduled Meds: . amiodarone  400 mg Oral BID  . atorvastatin  40 mg Oral q1800  . cholecalciferol  1,000 Units Oral BID  . docusate sodium  200 mg Oral BID  . hydrocortisone  1 application Rectal BID  . levothyroxine  100 mcg Oral QHS  . mometasone-formoterol  2 puff Inhalation BID  . [START ON 12/13/2015] multivitamin with minerals  1 tablet Oral Daily  . pantoprazole  40 mg Oral BID  . tiotropium  18 mcg Inhalation Daily  . vitamin B-12  1,000 mcg Oral QHS  . vitamin C  500 mg Oral BID  . Warfarin - Pharmacist Dosing Inpatient   Does not apply q1800   Continuous Infusions: . sodium chloride Stopped (12/11/15 0414)   PRN Meds:.furosemide, ipratropium-albuterol, nitroGLYCERIN, potassium chloride   ASSESMENT:   * GIB. FOBT + without melena or blood PR Multiple endoscopic studies not limited to:  09/27/2014 enteroscopy: jejunal bleeding, APC ablation and clips x 3. 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  11/09/2015 enteroscopy: normal Capsule endo 3/13: prelim reading as above.    * Blood loss anemia. S/p PRBC x 2. Last was overnight .   * Thrombocytopenia.   *  End stage heart failure. S/p LVAD. S/p ICD. Chronic Coumadin, last dose 1700 on 3/12. Therapeutic INR.    PLAN   *  Enteroscopy, hopefully tomorrow if MAC assistance available, on Coumadin.  Pt agreeable.     Azucena Freed  12/12/2015, 4:28 PM Pager: [phone]

## 2015-12-12 NOTE — Progress Notes (Signed)
ANTICOAGULATION CONSULT NOTE - Initial Consult  Pharmacy Consult for Warfarin Indication: LVAD  No Known Allergies  Patient Measurements: Height: 6' (182.9 cm) Weight: 214 lb 1.6 oz (97.115 kg) IBW/kg (Calculated) : 77.6 Heparin Dosing Weight:   Vital Signs: Temp: 98.5 F (36.9 C) (03/13 2323) Temp Source: Oral (03/13 2323) Pulse Rate: 74 (03/13 2115)  Labs:  Recent Labs  12/10/15 0521  12/11/15 0216 12/11/15 1342 12/12/15 0231  HGB 9.3*  < > 8.4* 8.0* 9.6*  HCT 30.4*  < > 27.0* 26.6* 31.7*  PLT 146*  < > 149* 152 179  LABPROT 22.4*  --  27.6*  --  29.5*  INR 1.98*  --  2.61*  --  2.85*  CREATININE 0.94  --  0.85  --  1.14  < > = values in this interval not displayed.  Estimated Creatinine Clearance: 68.7 mL/min (by C-G formula based on Cr of 1.14).   Medical History: Past Medical History  Diagnosis Date  . Ischemic cardiomyopathy      CABG 2003, PCI 2007  EF 27%(myoview 2012  . Chronic systolic heart failure ([place])   . Hyperlipidemia   . Hypothyroidism   . Chronic anticoagulation     Afib and LVAD  . Obesity   . COPD (chronic obstructive pulmonary disease) ([place])   . Asbestosis(501)     "6 years in the [place]" (05/24/2013)  . Atrial fibrillation ([place])     permanent  . Paroxysmal ventricular tachycardia ([place])   . Coronary artery disease   . Hypertension   . Depression   . LVAD (left ventricular assist device) present ([place]) 12/2012  . Epistaxis 11/2015, 07/2014  . Cancer ([place])     "scraped some off behind my left ear; fast moving; having it cut out 12/05/2015" (11/07/2015)  . CHF (congestive heart failure) ([place])   . Myocardial infarction ([place]) 1990's-2000    "2 in  ~ the 1990's; 1 in ~ 2000" (11/07/2015)  . On home oxygen therapy     "have it available; don't use it" (11/07/2015)  . OSA on CPAP   . Anemia   . History of blood transfusion 08/2015 X 10; 11/2015 X 2    "related to bleeding on the inside somewhere" (11/07/2015)  . Complication of anesthesia     "they  can't put me all the way to sleep cause of my heart" (11/07/2015)    Medications:  Prescriptions prior to admission  Medication Sig Dispense Refill Last Dose  . amiodarone (PACERONE) 200 MG tablet Take 2 tablets (400 mg total) by mouth daily. 90 tablet 3 12/08/2015 at Unknown time  . budesonide-formoterol (SYMBICORT) 160-4.5 MCG/ACT inhaler Inhale 2 puffs into the lungs 2 (two) times daily. (Patient taking differently: Inhale 2 puffs into the lungs daily. ) 1 Inhaler 3 12/08/2015 at Unknown time  . cholecalciferol (VITAMIN D) 1000 UNITS tablet Take 1 tablet (1,000 Units total) by mouth 2 (two) times daily. 180 tablet 3 12/08/2015 at Unknown time  . citalopram (CELEXA) 40 MG tablet Take 1 tablet (40 mg total) by mouth at bedtime. 90 tablet 3 12/07/2015 at Unknown time  . docusate sodium (COLACE) 100 MG capsule Take 200 mg by mouth 2 (two) times daily.   12/08/2015 at Unknown time  . hydrocortisone (ANUSOL-HC) 2.5 % rectal cream Place 1 application rectally 2 (two) times daily. 30 g 6 Past Week at Unknown time  . ipratropium-albuterol (DUONEB) 0.5-2.5 (3) MG/3ML SOLN Take 3 mLs by nebulization every 6 (six) hours as needed (  copd).   12/08/2015 at Unknown time  . levothyroxine (SYNTHROID, LEVOTHROID) 100 MCG tablet Take 100 mcg by mouth at bedtime.    12/07/2015 at Unknown time  . losartan (COZAAR) 100 MG tablet Take 1 tablet (100 mg total) by mouth at bedtime. 30 tablet 6 12/08/2015 at Unknown time  . Multiple Vitamins-Minerals (MULTIVITAMIN PO) Take 1 tablet by mouth daily.    12/08/2015 at Unknown time  . nitroGLYCERIN (NITROSTAT) 0.4 MG SL tablet Place 0.4 mg under the tongue every 5 (five) minutes as needed for chest pain.   rescue  . pantoprazole (PROTONIX) 40 MG tablet Take 1 tablet (40 mg total) by mouth 2 (two) times daily. 180 tablet 3 12/08/2015 at Unknown time  . simvastatin (ZOCOR) 80 MG tablet Take 0.5 tablets (40 mg total) by mouth at bedtime. 45 tablet 3 12/07/2015 at Unknown time  . tiotropium  (SPIRIVA) 18 MCG inhalation capsule Place 1 capsule (18 mcg total) into inhaler and inhale daily. 90 capsule 3 12/08/2015 at Unknown time  . vitamin B-12 (CYANOCOBALAMIN) 1000 MCG tablet Take 1,000 mcg by mouth at bedtime.   12/07/2015 at Unknown time  . vitamin C (ASCORBIC ACID) 500 MG tablet Take 1 tablet (500 mg total) by mouth 2 (two) times daily. 180 tablet 3 12/08/2015 at Unknown time  . warfarin (COUMADIN) 5 MG tablet Take 2.5 mg on Mon and Fri. All other days take 5 mg . On  6/21 take 2.5 mg. . (Patient taking differently: Take 2.5-5 mg by mouth daily at 6 PM. Takes 1/2 tab on Mon, Wed and Fri  Takes 1 tab all other days) 200 tablet 3 12/07/2015 at 2000  . furosemide (LASIX) 40 MG tablet Take 40 mg by mouth as needed for fluid. Reported on 11/21/2015   11/14/2015  . potassium chloride (K-DUR) 10 MEQ tablet Take 10 mEq by mouth as needed (when taking furosemide).   11/14/2015   Scheduled:  . amiodarone  400 mg Oral BID  . atorvastatin  40 mg Oral q1800  . cholecalciferol  1,000 Units Oral BID  . docusate sodium  200 mg Oral BID  . hydrocortisone  1 application Rectal BID  . levothyroxine  100 mcg Oral QHS  . mometasone-formoterol  2 puff Inhalation BID  . multivitamin  15 mL Oral Daily  . pantoprazole  40 mg Oral BID  . tiotropium  18 mcg Inhalation Daily  . vitamin B-12  1,000 mcg Oral QHS  . vitamin C  500 mg Oral BID  . Warfarin - Pharmacist Dosing Inpatient   Does not apply q1800   Infusions:  . sodium chloride Stopped (12/11/15 0414)    Assessment: 75yo male with history of LVAD presents with fatigue and atrial flutter. Pharmacy is consulted to dose warfarin for LVAD. INR 2.01 on admission. Pt appears to have recurrent GI bleed. Capsule endoscopy 3/13.  INR today still trending up to 2.85 after 1 dose held yesterday. Patient has lower INR goal. Hgb 9.6 (s/p 1 PRBC 3/13), Plt wnl, sCr 1.14.   PTA Warfarin Dose: 2.5mg  MWF and 5mg  AODs with last dose PTA 3/9  Goal of Therapy:  INR  goal 1.8 - 2.5 per HF outpatient notes  Monitor platelets by anticoagulation protocol: Yes   Plan:  Hold warfarin tonight Daily INR/CBC Monitor s/sx of bleeding   Melburn Popper, PharmD Clinical Pharmacy Resident Pager: [phone] 12/12/2015 7:24 AM

## 2015-12-12 NOTE — Progress Notes (Signed)
CARDIAC REHAB PHASE I   PRE:  Rate/Rhythm: 74 SR  BP:  Supine:  Sitting: 84 MAP  Standing:    SaO2: 92%RA  MODE:  Ambulation: 270 ft   POST:  Rate/Rhythm: 79 SOME PVCs  BP:  Supine:   Sitting: 98 MAP  Standing:    SaO2: 92%RA 1330-1355 Pt already to batteries. Walked 270 ft on RA with asst x 1. Stopped twice to rest. Tolerated well. Back to bed after walk.   Graylon Good, RN BSN  12/12/2015 1:52 PM

## 2015-12-13 ENCOUNTER — Encounter (HOSPITAL_COMMUNITY)
Admission: EM | Disposition: A | Discharge status: Home / Self Care | Payer: Self-pay | Source: Home / Self Care | Attending: Internal Medicine

## 2015-12-13 ENCOUNTER — Inpatient Hospital Stay (HOSPITAL_COMMUNITY): Payer: No Typology Code available for payment source | Admitting: Certified Registered Nurse Anesthetist

## 2015-12-13 ENCOUNTER — Encounter (HOSPITAL_COMMUNITY): Payer: Self-pay | Admitting: Certified Registered Nurse Anesthetist

## 2015-12-13 HISTORY — PX: ENTEROSCOPY: SHX5533

## 2015-12-13 LAB — CBC
HCT: 27.5 % — ABNORMAL LOW (ref 39.0–52.0)
HCT: 32.6 % — ABNORMAL LOW (ref 39.0–52.0)
HEMOGLOBIN: 9.9 g/dL — AB (ref 13.0–17.0)
Hemoglobin: 8.4 g/dL — ABNORMAL LOW (ref 13.0–17.0)
MCH: 30 pg (ref 26.0–34.0)
MCH: 29.6 pg (ref 26.0–34.0)
MCHC: 30.5 g/dL (ref 30.0–36.0)
MCHC: 30.4 g/dL (ref 30.0–36.0)
MCV: 98.2 fL (ref 78.0–100.0)
MCV: 97.6 fL (ref 78.0–100.0)
PLATELETS: 142 10*3/uL — AB (ref 150–400)
Platelets: 182 10*3/uL (ref 150–400)
RBC: 2.8 MIL/uL — AB (ref 4.22–5.81)
RBC: 3.34 MIL/uL — ABNORMAL LOW (ref 4.22–5.81)
RDW: 21.1 % — ABNORMAL HIGH (ref 11.5–15.5)
RDW: 20.9 % — ABNORMAL HIGH (ref 11.5–15.5)
WBC: 6.4 10*3/uL (ref 4.0–10.5)
WBC: 8.2 10*3/uL (ref 4.0–10.5)

## 2015-12-13 LAB — PROTIME-INR
INR: 2.41 — AB (ref 0.00–1.49)
PROTHROMBIN TIME: 25.9 s — AB (ref 11.6–15.2)

## 2015-12-13 LAB — BASIC METABOLIC PANEL
ANION GAP: 8 (ref 5–15)
BUN: 19 mg/dL (ref 6–20)
CO2: 25 mmol/L (ref 22–32)
Calcium: 9.2 mg/dL (ref 8.9–10.3)
Chloride: 105 mmol/L (ref 101–111)
Creatinine, Ser: 0.97 mg/dL (ref 0.61–1.24)
Glucose, Bld: 143 mg/dL — ABNORMAL HIGH (ref 65–99)
POTASSIUM: 3.6 mmol/L (ref 3.5–5.1)
SODIUM: 138 mmol/L (ref 135–145)

## 2015-12-13 LAB — LACTATE DEHYDROGENASE: LDH: 218 U/L — AB (ref 98–192)

## 2015-12-13 LAB — MAGNESIUM: MAGNESIUM: 2 mg/dL (ref 1.7–2.4)

## 2015-12-13 LAB — PHOSPHORUS: PHOSPHORUS: 3.1 mg/dL (ref 2.5–4.6)

## 2015-12-13 LAB — PREPARE RBC (CROSSMATCH)

## 2015-12-13 SURGERY — ENTEROSCOPY
Anesthesia: Monitor Anesthesia Care

## 2015-12-13 MED ORDER — LIDOCAINE HCL (CARDIAC) 20 MG/ML IV SOLN
INTRAVENOUS | Status: DC | PRN
  Administered 2015-12-13: 50 mg via INTRATRACHEAL

## 2015-12-13 MED ORDER — SODIUM CHLORIDE 0.9 % IV SOLN: Freq: Once | INTRAVENOUS | Status: DC

## 2015-12-13 MED ORDER — PROPOFOL 500 MG/50ML IV EMUL
INTRAVENOUS | Status: DC | PRN
  Administered 2015-12-13: 50 ug/kg/min via INTRAVENOUS

## 2015-12-13 MED ORDER — LACTATED RINGERS IV SOLN
INTRAVENOUS | Status: DC
  Administered 2015-12-13: 14:00:00 via INTRAVENOUS
  Administered 2015-12-13: 1000 mL via INTRAVENOUS

## 2015-12-13 MED ORDER — SODIUM CHLORIDE 0.9 % IV SOLN: INTRAVENOUS | Status: DC

## 2015-12-13 MED ORDER — PROPOFOL 10 MG/ML IV BOLUS
INTRAVENOUS | Status: DC | PRN
  Administered 2015-12-13: 20 mg via INTRAVENOUS

## 2015-12-13 MED ORDER — WARFARIN SODIUM 2.5 MG PO TABS
2.5000 mg | ORAL_TABLET | Freq: Once | ORAL | Status: AC
  Administered 2015-12-13: 2.5 mg via ORAL
  Filled 2015-12-13: qty 1

## 2015-12-13 NOTE — Progress Notes (Signed)
Spoke with Cards Fellow re: VT runs.  Mg and Phos labs pending, K 3.6, pt asymptomatic, asleep during the episodes.  Discussed with fellow any other measures at this time.  No new orders, will await lab results.    Carol Ada, RN

## 2015-12-13 NOTE — Anesthesia Postprocedure Evaluation (Signed)
Anesthesia Post Note  Patient: [name]  Procedure(s) Performed: Procedure(s) (LRB): ENTEROSCOPY (N/A)  Patient location during evaluation: PACU Anesthesia Type: MAC Level of consciousness: awake and alert Pain management: pain level controlled Vital Signs Assessment: post-procedure vital signs reviewed and stable Respiratory status: spontaneous breathing, nonlabored ventilation, respiratory function stable and patient connected to nasal cannula oxygen Cardiovascular status: stable and blood pressure returned to baseline Anesthetic complications: no    Last Vitals:  Filed Vitals:   12/13/15 1500 12/13/15 1517  BP:    Pulse:    Temp: 36.7 C 36.8 C  Resp: 16 18    Last Pain:  Filed Vitals:   12/13/15 1552  PainSc: 0-No pain                 Effie Berkshire

## 2015-12-13 NOTE — Op Note (Signed)
[facility] Patient Name: [name] Procedure Date : 12/13/2015 MRN: [id number] Attending MD: Milus Banister , MD Date of Birth: 03/04/1941 CSN: [id number] Age: 75 Admit Type: Inpatient Procedure:                Small bowel enteroscopy Indications:              Obscure gastrointestinal bleeding Providers:                Milus Banister, MD, Dortha Schwalbe, RN, Despina Pole, Technician Referring MD:              Medicines:                Monitored Anesthesia Care Complications:            No immediate complications. Estimated Blood Loss:     Estimated blood loss: none. Procedure:                Pre-Anesthesia Assessment:                           - Prior to the procedure, a History and Physical                            was performed, and patient medications and                            allergies were reviewed. The patient's tolerance of                            previous anesthesia was also reviewed. The risks                            and benefits of the procedure and the sedation                            options and risks were discussed with the patient.                            All questions were answered, and informed consent                            was obtained. Prior Anticoagulants: The patient has                            taken Coumadin (warfarin). ASA Grade Assessment: IV                            - A patient with severe systemic disease that is a                            constant threat to life. After reviewing the risks  and benefits, the patient was deemed in                            satisfactory condition to undergo the procedure.                           After obtaining informed consent, the endoscope was                            passed under direct vision. Throughout the                            procedure, the patient's blood pressure, pulse, and   oxygen saturations were monitored continuously. The                            [id number]) scope was introduced through                            the mouth and advanced to the proximal jejunum. The                            small bowel enteroscopy was accomplished without                            difficulty. The patient tolerated the procedure                            well. Scope In: Scope Out: Findings:      The esophagus was normal.      The stomach was normal.      The examined duodenum was normal.      There was no evidence of significant pathology in the entire examined       portion of jejunum. Impression:               - Normal esophagus.                           - Normal stomach.                           - Normal examined duodenum.                           - The examined portion of the jejunum was normal.                           - No specimens collected.                           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 There  was no blood in the UGI tract either. Moderate Sedation:         Recommendation:           - Return patient to hospital ward for ongoing care.                           - Ok to d/c home from GI standpoint.                           - Please call if any further GI questions or                            concerns. Procedure Code(s):        --- Professional ---                           [phone], Small intestinal endoscopy, enteroscopy                            beyond second portion of duodenum, not including                            ileum; diagnostic, including collection of                            specimen(s) by brushing or washing, when performed                            (separate procedure) Diagnosis Code(s):        --- Professional ---                           K92.2, Gastrointestinal hemorrhage, unspecified CPT  copyright 2016 American Medical Association. All rights reserved. The codes documented in this report are preliminary and upon coder review may  be revised to meet current compliance requirements. Milus Banister, MD Milus Banister, MD 12/13/2015 2:21:54 PM Number of Addenda: 0

## 2015-12-13 NOTE — Progress Notes (Signed)
ANTICOAGULATION CONSULT NOTE - Initial Consult  Pharmacy Consult for Warfarin Indication: LVAD  No Known Allergies  Patient Measurements: Height: 6' (182.9 cm) Weight: 215 lb (97.523 kg) IBW/kg (Calculated) : 77.6 Heparin Dosing Weight:   Vital Signs: Temp: 97.9 F (36.6 C) (03/15 0859) Temp Source: Oral (03/15 0859) Pulse Rate: 60 (03/15 0859)  Labs:  Recent Labs  12/11/15 0216 12/11/15 1342 12/12/15 0231 12/13/15 0300  HGB 8.4* 8.0* 9.6* 8.4*  HCT 27.0* 26.6* 31.7* 27.5*  PLT 149* 152 179 142*  LABPROT 27.6*  --  29.5* 25.9*  INR 2.61*  --  2.85* 2.41*  CREATININE 0.85  --  1.14 0.97    Estimated Creatinine Clearance: 80.9 mL/min (by C-G formula based on Cr of 0.97).   Medical History: Past Medical History  Diagnosis Date  . Ischemic cardiomyopathy      CABG 2003, PCI 2007  EF 27%(myoview 2012  . Chronic systolic heart failure ([place])   . Hyperlipidemia   . Hypothyroidism   . Chronic anticoagulation     Afib and LVAD  . Obesity   . COPD (chronic obstructive pulmonary disease) ([place])   . Asbestosis(501)     "6 years in the [place]" (05/24/2013)  . Atrial fibrillation ([place])     permanent  . Paroxysmal ventricular tachycardia ([place])   . Coronary artery disease   . Hypertension   . Depression   . LVAD (left ventricular assist device) present ([place]) 12/2012  . Epistaxis 11/2015, 07/2014  . Cancer ([place])     "scraped some off behind my left ear; fast moving; having it cut out 12/05/2015" (11/07/2015)  . CHF (congestive heart failure) ([place])   . Myocardial infarction ([place]) 1990's-2000    "2 in  ~ the 1990's; 1 in ~ 2000" (11/07/2015)  . On home oxygen therapy     "have it available; don't use it" (11/07/2015)  . OSA on CPAP   . Anemia   . History of blood transfusion 08/2015 X 10; 11/2015 X 2    "related to bleeding on the inside somewhere" (11/07/2015)  . Complication of anesthesia     "they can't put me all the way to sleep cause of my heart" (11/07/2015)    Medications:   Prescriptions prior to admission  Medication Sig Dispense Refill Last Dose  . amiodarone (PACERONE) 200 MG tablet Take 2 tablets (400 mg total) by mouth daily. 90 tablet 3 12/08/2015 at Unknown time  . budesonide-formoterol (SYMBICORT) 160-4.5 MCG/ACT inhaler Inhale 2 puffs into the lungs 2 (two) times daily. (Patient taking differently: Inhale 2 puffs into the lungs daily. ) 1 Inhaler 3 12/08/2015 at Unknown time  . cholecalciferol (VITAMIN D) 1000 UNITS tablet Take 1 tablet (1,000 Units total) by mouth 2 (two) times daily. 180 tablet 3 12/08/2015 at Unknown time  . citalopram (CELEXA) 40 MG tablet Take 1 tablet (40 mg total) by mouth at bedtime. 90 tablet 3 12/07/2015 at Unknown time  . docusate sodium (COLACE) 100 MG capsule Take 200 mg by mouth 2 (two) times daily.   12/08/2015 at Unknown time  . hydrocortisone (ANUSOL-HC) 2.5 % rectal cream Place 1 application rectally 2 (two) times daily. 30 g 6 Past Week at Unknown time  . ipratropium-albuterol (DUONEB) 0.5-2.5 (3) MG/3ML SOLN Take 3 mLs by nebulization every 6 (six) hours as needed (copd).   12/08/2015 at Unknown time  . levothyroxine (SYNTHROID, LEVOTHROID) 100 MCG tablet Take 100 mcg by mouth at bedtime.    12/07/2015 at Unknown time  .  losartan (COZAAR) 100 MG tablet Take 1 tablet (100 mg total) by mouth at bedtime. 30 tablet 6 12/08/2015 at Unknown time  . Multiple Vitamins-Minerals (MULTIVITAMIN PO) Take 1 tablet by mouth daily.    12/08/2015 at Unknown time  . nitroGLYCERIN (NITROSTAT) 0.4 MG SL tablet Place 0.4 mg under the tongue every 5 (five) minutes as needed for chest pain.   rescue  . pantoprazole (PROTONIX) 40 MG tablet Take 1 tablet (40 mg total) by mouth 2 (two) times daily. 180 tablet 3 12/08/2015 at Unknown time  . simvastatin (ZOCOR) 80 MG tablet Take 0.5 tablets (40 mg total) by mouth at bedtime. 45 tablet 3 12/07/2015 at Unknown time  . tiotropium (SPIRIVA) 18 MCG inhalation capsule Place 1 capsule (18 mcg total) into inhaler and  inhale daily. 90 capsule 3 12/08/2015 at Unknown time  . vitamin B-12 (CYANOCOBALAMIN) 1000 MCG tablet Take 1,000 mcg by mouth at bedtime.   12/07/2015 at Unknown time  . vitamin C (ASCORBIC ACID) 500 MG tablet Take 1 tablet (500 mg total) by mouth 2 (two) times daily. 180 tablet 3 12/08/2015 at Unknown time  . warfarin (COUMADIN) 5 MG tablet Take 2.5 mg on Mon and Fri. All other days take 5 mg . On  6/21 take 2.5 mg. . (Patient taking differently: Take 2.5-5 mg by mouth daily at 6 PM. Takes 1/2 tab on Mon, Wed and Fri  Takes 1 tab all other days) 200 tablet 3 12/07/2015 at 2000  . furosemide (LASIX) 40 MG tablet Take 40 mg by mouth as needed for fluid. Reported on 11/21/2015   11/14/2015  . potassium chloride (K-DUR) 10 MEQ tablet Take 10 mEq by mouth as needed (when taking furosemide).   11/14/2015   Scheduled:  . amiodarone  400 mg Oral BID  . atorvastatin  40 mg Oral q1800  . cholecalciferol  1,000 Units Oral BID  . docusate sodium  200 mg Oral BID  . hydrocortisone  1 application Rectal BID  . levothyroxine  100 mcg Oral QHS  . mometasone-formoterol  2 puff Inhalation BID  . multivitamin with minerals  1 tablet Oral Daily  . pantoprazole  40 mg Oral BID  . tiotropium  18 mcg Inhalation Daily  . vitamin B-12  1,000 mcg Oral QHS  . vitamin C  500 mg Oral BID  . Warfarin - Pharmacist Dosing Inpatient   Does not apply q1800   Infusions:  . sodium chloride Stopped (12/11/15 0414)  . sodium chloride      Assessment: 75yo male with history of LVAD presents with fatigue and atrial flutter. Pharmacy is consulted to dose warfarin for LVAD. INR 2.01 on admission. Pt appears to have recurrent GI bleed.  INR today 2.41 and trending down after warfarin held *2days. Patient has lower INR goal. Hgb 8.4 (s/p 1 PRBC 3/13), Plt wnl.  PTA Warfarin Dose: 2.5mg  MWF and 5mg  AODs with last dose PTA 3/9  Goal of Therapy:  INR goal 1.8 - 2.5 per HF outpatient notes  Monitor platelets by anticoagulation  protocol: Yes   Plan:  2.5mg  warfarin*1 tonight Daily INR/CBC Monitor s/sx of bleeding   Melburn Popper, PharmD Clinical Pharmacy Resident Pager: [phone] 12/13/2015 9:23 AM

## 2015-12-13 NOTE — Telephone Encounter (Signed)
Spoke with patient's wife, [name] is currently in the hospital.  We will await his discharge and see when he needs to follow up with Dr Carlean Purl.  We canceled his appointment with Dr Silverio Decamp for late April.  And per his wife he doesn't want to have any hemorrhoid banding done.

## 2015-12-13 NOTE — Transfer of Care (Signed)
Immediate Anesthesia Transfer of Care Note  Patient: [name]  Procedure(s) Performed: Procedure(s): ENTEROSCOPY (N/A)  Patient Location: PACU  Anesthesia Type:MAC  Level of Consciousness: awake  Airway & Oxygen Therapy: Patient Spontanous Breathing  Post-op Assessment: Report given to RN and Post -op Vital signs reviewed and stable  Post vital signs: stable  Last Vitals:  Filed Vitals:   12/13/15 1312 12/13/15 1416  BP: 109/86 92/63  Pulse:  56  Temp:  36.9 C  Resp:  22    Complications: No apparent anesthesia complications

## 2015-12-13 NOTE — Anesthesia Preprocedure Evaluation (Addendum)
Anesthesia Evaluation  Patient identified by MRN, date of birth, ID band Patient awake    Reviewed: Allergy & Precautions, NPO status , Patient's Chart, lab work & pertinent test results  History of Anesthesia Complications (+) history of anesthetic complications  Airway Mallampati: I  TM Distance: >3 FB Neck ROM: Full    Dental  (+) Edentulous Upper, Edentulous Lower   Pulmonary sleep apnea and Continuous Positive Airway Pressure Ventilation , COPD, former smoker,     + decreased breath sounds      Cardiovascular hypertension, Pt. on medications + CAD, + Past MI and +CHF  + dysrhythmias Ventricular Tachycardia  Rhythm:Regular     Neuro/Psych PSYCHIATRIC DISORDERS Depression TIA   GI/Hepatic Neg liver ROS, PUD, GERD  Medicated,  Endo/Other  Hypothyroidism   Renal/GU negative Renal ROS  negative genitourinary   Musculoskeletal negative musculoskeletal ROS (+)   Abdominal (+)  Abdomen: soft.    Peds negative pediatric ROS (+)  Hematology   Anesthesia Other Findings - LVAD insertion - 2014  Reproductive/Obstetrics negative OB ROS                            Lab Results  Component Value Date   WBC 6.4 12/13/2015   HGB 8.4* 12/13/2015   HCT 27.5* 12/13/2015   MCV 98.2 12/13/2015   PLT 142* 12/13/2015   Lab Results  Component Value Date   CREATININE 0.97 12/13/2015   BUN 19 12/13/2015   NA 138 12/13/2015   K 3.6 12/13/2015   CL 105 12/13/2015   CO2 25 12/13/2015   Lab Results  Component Value Date   INR 2.41* 12/13/2015   INR 2.85* 12/12/2015   INR 2.61* 12/11/2015   11/2015 EKG: normal sinus rhythm.   Anesthesia Physical Anesthesia Plan  ASA: IV  Anesthesia Plan: MAC   Post-op Pain Management:    Induction: Intravenous  Airway Management Planned: Natural Airway and Simple Face Mask  Additional Equipment:   Intra-op Plan:   Post-operative Plan:   Informed  Consent: I have reviewed the patients History and Physical, chart, labs and discussed the procedure including the risks, benefits and alternatives for the proposed anesthesia with the patient or authorized representative who has indicated his/her understanding and acceptance.     Plan Discussed with: CRNA  Anesthesia Plan Comments:         Anesthesia Quick Evaluation

## 2015-12-13 NOTE — Progress Notes (Signed)
Patient ID: [name], male   DOB: July 02, 1941, 75 y.o.   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   Denies SOB/CP/orthopnea/palpitations. 2 black, loose BMs yesterday  Hgb 8.0 12/11/15 -> 1UPRBCs --> 9.6 --> Todays Hgb 8.4   LVAD INTERROGATION:  HeartMate II LVAD: Flow 4.9  liters/min, speed 9200, power 5.4, PI 4.5. 1-2 PI events.      Objective:    Vital Signs:   Temp:  [97.5 F (36.4 C)-98.6 F (37 C)] 98.4 F (36.9 C) (03/15 0402) Pulse Rate:  [63-81] 81 (03/14 2010) Resp:  [18] 18 (03/14 2010) SpO2:  [94 %-96 %] 95 % (03/15 0402) Weight:  [215 lb (97.523 kg)] 215 lb (97.523 kg) (03/15 0402)   Mean arterial Pressure 70s  Intake/Output:   Intake/Output Summary (Last 24 hours) at 12/13/15 0726 Last data filed at 12/12/15 2010  Gross per 24 hour  Intake   1070 ml  Output    250 ml  Net    820 ml     Physical Exam: General: NAD.  No resp difficulty. In bed.  HEENT: normal. L neck dressing CDI Neck: supple. JVP 6-7 . Carotids 2+ bilat; no bruits. No thyromegaly or nodule noted.  Cor: Mechanical heart sounds with LVAD hum present. Lungs: CTAB, normal effort Abdomen: obese, soft, NT, ND, no HSM. No bruits or masses. +BS  Driveline: C/D/I; securement device intact and driveline incorporated Extremities: no cyanosis, clubbing, rash, edema Neuro: alert & orientedx3, cranial nerves grossly intact. moves all 4 extremities w/o difficulty. Affect pleasant  Telemetry- NSR 60s with PVCs. Several runs of NSVT overnight. One of 10 beats.  Labs: Basic Metabolic Panel:  Recent Labs Lab 12/09/15 0258 12/10/15 0521 12/11/15 0216 12/12/15 0231 12/13/15 0300  NA 139 136 137 138 138  K 4.0 4.2 3.8 3.6 3.6  CL 108 104 104 103 105  CO2 25 23 25 26 25   GLUCOSE 108* 119*  109* 112* 143*  BUN 26* 17 17 18 19   CREATININE 0.94 0.94 0.85 1.14 0.97  CALCIUM 9.1 9.6 9.2 9.8 9.2  MG  --   --   --   --  2.0  PHOS  --   --   --   --  3.1    Liver Function Tests:  Recent Labs Lab 12/09/15 0258 12/10/15 0521 12/11/15 0216  AST 36 38 37  ALT 17 22 22   ALKPHOS 39 40 34*  BILITOT 0.4 0.6 0.9  PROT 5.4* 5.5* 5.2*  ALBUMIN 3.1* 3.4* 3.0*   No results for input(s): LIPASE, AMYLASE in the last 168 hours. No results for input(s): AMMONIA in the last 168 hours.  CBC:  Recent Labs Lab 12/09/15 0258  12/10/15 1450 12/11/15 0216 12/11/15 1342 12/12/15 0231 12/13/15 0300  WBC 7.1  < > 7.5 7.5 7.5 9.0 6.4  NEUTROABS 5.6  --   --   --   --   --   --   HGB 8.2*  < > 8.8* 8.4* 8.0* 9.6* 8.4*  HCT 27.3*  < > 28.9* 27.0* 26.6* 31.7* 27.5*  MCV 101.1*  < > 99.3 99.3 99.3 98.4 98.2  PLT 153  < > 142* 149* 152 179 142*  < > = values in this interval not displayed.  INR:  Recent  Labs Lab 12/09/15 0258 12/10/15 0521 12/11/15 0216 12/12/15 0231 12/13/15 0300  INR 1.98* 1.98* 2.61* 2.85* 2.41*    Other results:    Imaging: No results found.   Medications:     Scheduled Medications: . amiodarone  400 mg Oral BID  . atorvastatin  40 mg Oral q1800  . cholecalciferol  1,000 Units Oral BID  . docusate sodium  200 mg Oral BID  . hydrocortisone  1 application Rectal BID  . levothyroxine  100 mcg Oral QHS  . mometasone-formoterol  2 puff Inhalation BID  . multivitamin with minerals  1 tablet Oral Daily  . pantoprazole  40 mg Oral BID  . tiotropium  18 mcg Inhalation Daily  . vitamin B-12  1,000 mcg Oral QHS  . vitamin C  500 mg Oral BID  . Warfarin - Pharmacist Dosing Inpatient   Does not apply q1800    Infusions: . sodium chloride Stopped (12/11/15 0414)  . sodium chloride      PRN Medications: furosemide, ipratropium-albuterol, nitroGLYCERIN, potassium chloride   Assessment:   1. Fatigue - likely due to GIB 2. Recurrent GIB     --  H/O GI bleed.     -- Enteroscopy 12/15 showed bleed from jejunum requiring 3 clips     -- Enteroscopy 2/17 normal 3. Recurrent AF/AFL with controlled rate 4. Symptomatic Anemia 5. Chronic Systolic HF: S/P LVAD for DT 12/2012  6. OSA 7. NSVT  Plan/Discussion:    Appears to have recurrent GI bleed. INR now 2.85  Off ASA previously. Recent enteroscopy normal. 1 unit PRBCs 3/11 with appropriate hemoglobin bump. Received 1UPRBC 3/13 with appropriate rise from 8.0>9.6. Back down at 8.4 today. May need another unit today.     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 Currently scheduled for 130 today, thought tentative with timing for anasthesia and MD.   - Continue PPI.  - Will need to work on getting him octreotide.  This may be difficult as his meds come through the [place].  If cannot get, may consider switching from coumadin to eliquis to see if this reduces his bleding.   Remains in NSR. Continue amio 400 mg BID for now.   Some NSVT over night.  Electrolytes mostly stable. Supp K.   I reviewed the LVAD parameters from today, and compared the results to the patient's prior recorded data.  No programming changes were made.  The LVAD is functioning within specified parameters.  The patient performs LVAD self-test daily.  LVAD interrogation was negative for any significant power changes, alarms or PI events/speed drops.  LVAD equipment check completed and is in good working order.  Back-up equipment present.   LVAD education done on emergency procedures and precautions and reviewed exit site care.  Length of Stay: 5  Shirley Friar PA-C 12/13/2015, 7:26 AM  VAD Team --- VAD ISSUES ONLY--- Pager [phone] (7am - 7am)  Advanced Heart Failure Team  Pager [phone] (M-F; 7a - 4p)  Please contact [facility] for night-coverage after hours (4p -7a ) and  weekends on [email or web address]  Patient seen and examined with Oda Kilts, PA-C. We discussed all aspects of the encounter. I agree with the assessment and plan as stated above.   Hemoglobin back down today. 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 However had repeat push enteroscopy today and no AVMs located. Will give  1 more unit RBCs. Will try to get octreotide. If unable to obtain will switch coumadin to apixaban.  Jermain Curt,MD 2:28 PM

## 2015-12-13 NOTE — H&P (View-Only) (Signed)
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 Pt says stools are/have been black.  He feels ok  OBJECTIVE:         Vital signs in last 24 hours:    Temp:  [97.5 F (36.4 C)-98.7 F (37.1 C)] 97.5 F (36.4 C) (03/14 1230) Pulse Rate:  [67-74] 67 (03/14 0948) Resp:  [16-18] 18 (03/14 0948) SpO2:  [94 %-96 %] 94 % (03/14 0948) Weight:  [97.115 kg (214 lb 1.6 oz)] 97.115 kg (214 lb 1.6 oz) (03/14 0708)   Filed Weights   12/10/15 0500 12/11/15 0500 12/12/15 0708  Weight: 98.204 kg (216 lb 8 oz) 97.796 kg (215 lb 9.6 oz) 97.115 kg (214 lb 1.6 oz)   General: looks chronically ill   Heart: vad hum Chest: clear bil Abdomen: somewhat protuberant but soft and NT.  Active BS.  driveline site benign  Extremities: no CCE Neuro/Psych:  Oriented x 3.  No gross deficits.   Intake/Output from previous day: 03/13 0701 - 03/14 0700 In: 669.8 [P.O.:300; I.V.:34.8; Blood:335] Out: 250 [Urine:250]  Intake/Output this shift: Total I/O In: 710 [P.O.:710] Out: 250 [Urine:250]  Lab Results:  Recent Labs  12/11/15 0216 12/11/15 1342 12/12/15 0231  WBC 7.5 7.5 9.0  HGB 8.4* 8.0* 9.6*  HCT 27.0* 26.6* 31.7*  PLT 149* 152 179   BMET  Recent Labs  12/10/15 0521 12/11/15 0216 12/12/15 0231  NA 136 137 138  K 4.2 3.8 3.6  CL 104 104 103  CO2 23 25 26   GLUCOSE 119* 109* 112*  BUN 17 17 18   CREATININE 0.94 0.85 1.14  CALCIUM 9.6 9.2 9.8   LFT  Recent Labs  12/10/15 0521 12/11/15 0216  PROT 5.5* 5.2*  ALBUMIN 3.4* 3.0*  AST 38 37  ALT 22 22  ALKPHOS 40 34*  BILITOT 0.6 0.9   PT/INR  Recent Labs  12/11/15 0216 12/12/15 0231  LABPROT 27.6* 29.5*  INR 2.61* 2.85*   Hepatitis Panel No results for input(s): HEPBSAG, HCVAB, HEPAIGM, HEPBIGM in the last 72  hours.  Studies/Results: No results found.  Scheduled Meds: . amiodarone  400 mg Oral BID  . atorvastatin  40 mg Oral q1800  . cholecalciferol  1,000 Units Oral BID  . docusate sodium  200 mg Oral BID  . hydrocortisone  1 application Rectal BID  . levothyroxine  100 mcg Oral QHS  . mometasone-formoterol  2 puff Inhalation BID  . [START ON 12/13/2015] multivitamin with minerals  1 tablet Oral Daily  . pantoprazole  40 mg Oral BID  . tiotropium  18 mcg Inhalation Daily  . vitamin B-12  1,000 mcg Oral QHS  . vitamin C  500 mg Oral BID  . Warfarin - Pharmacist Dosing Inpatient   Does not apply q1800   Continuous Infusions: . sodium chloride Stopped (12/11/15 0414)   PRN Meds:.furosemide, ipratropium-albuterol, nitroGLYCERIN, potassium chloride   ASSESMENT:   * GIB. FOBT + without melena or blood PR Multiple endoscopic studies not limited to:  09/27/2014 enteroscopy: jejunal bleeding, APC ablation and clips x 3. 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  11/09/2015 enteroscopy: normal Capsule endo 3/13: prelim reading as above.    * Blood loss anemia. S/p PRBC x 2. Last was overnight .   * Thrombocytopenia.   *  End stage heart failure. S/p LVAD. S/p ICD. Chronic Coumadin, last dose 1700 on 3/12. Therapeutic INR.    PLAN   *  Enteroscopy, hopefully tomorrow if MAC assistance available, on Coumadin.  Pt agreeable.     Azucena Freed  12/12/2015, 4:28 PM Pager: [phone]

## 2015-12-13 NOTE — Interval H&P Note (Signed)
History and Physical Interval Note:  12/13/2015 1:11 PM  [name]  has presented today for surgery, with the diagnosis of anemia,  FOBT +  The various methods of treatment have been discussed with the patient and family. After consideration of risks, benefits and other options for treatment, the patient has consented to  Procedure(s): ENTEROSCOPY (N/A) as a surgical intervention .  The patient's history has been reviewed, patient examined, no change in status, stable for surgery.  I have reviewed the patient's chart and labs.  Questions were answered to the patient's satisfaction.     Milus Banister

## 2015-12-14 ENCOUNTER — Encounter (HOSPITAL_COMMUNITY): Payer: Self-pay | Admitting: Gastroenterology

## 2015-12-14 LAB — BASIC METABOLIC PANEL
ANION GAP: 8 (ref 5–15)
BUN: 20 mg/dL (ref 6–20)
CHLORIDE: 107 mmol/L (ref 101–111)
CO2: 24 mmol/L (ref 22–32)
Calcium: 9.5 mg/dL (ref 8.9–10.3)
Creatinine, Ser: 1.02 mg/dL (ref 0.61–1.24)
GFR calc Af Amer: >60 mL/min (ref >60)
Glucose, Bld: 103 mg/dL — ABNORMAL HIGH (ref 65–99)
POTASSIUM: 3.9 mmol/L (ref 3.5–5.1)
SODIUM: 139 mmol/L (ref 135–145)

## 2015-12-14 LAB — CBC
HCT: 33.8 % — ABNORMAL LOW (ref 39.0–52.0)
HEMOGLOBIN: 10 g/dL — AB (ref 13.0–17.0)
MCH: 28.8 pg (ref 26.0–34.0)
MCHC: 29.6 g/dL — ABNORMAL LOW (ref 30.0–36.0)
MCV: 97.4 fL (ref 78.0–100.0)
PLATELETS: 162 10*3/uL (ref 150–400)
RBC: 3.47 MIL/uL — AB (ref 4.22–5.81)
RDW: 20.7 % — ABNORMAL HIGH (ref 11.5–15.5)
WBC: 7.7 10*3/uL (ref 4.0–10.5)

## 2015-12-14 LAB — TYPE AND SCREEN
ABO/RH(D): O POS
Antibody Screen: NEGATIVE
UNIT DIVISION: 0

## 2015-12-14 LAB — PROTIME-INR
INR: 1.73 — AB (ref 0.00–1.49)
PROTHROMBIN TIME: 20.3 s — AB (ref 11.6–15.2)

## 2015-12-14 LAB — LACTATE DEHYDROGENASE: LDH: 255 U/L — ABNORMAL HIGH (ref 98–192)

## 2015-12-14 MED ORDER — WARFARIN SODIUM 2.5 MG PO TABS: 2.5000 mg | ORAL_TABLET | Freq: Once | ORAL | Status: DC

## 2015-12-14 MED ORDER — AMIODARONE HCL 200 MG PO TABS: ORAL_TABLET | ORAL | Status: DC

## 2015-12-14 MED ORDER — APIXABAN 5 MG PO TABS: 5.0000 mg | ORAL_TABLET | Freq: Two times a day (BID) | ORAL | Status: DC

## 2015-12-14 NOTE — Progress Notes (Signed)
VAD Coordinator Procedure Note:   Patient underwent push enteroscopy in endo suite. Hemodynamics and VAD parameters monitored by anesthesia and me throughout the procedure. Doppler check pre procedure shoed modified systolic pressure correlated with auto cuff. BPs were obtained with auto BP cuff during procedure.       Doppler Auto cuff(MAP):   Flow: PI: Power:     Speed:               Pre-procedure: 1:30 pm  96  109/86 (91)  5.1 5.8 5.6      9200 1:50     101/75 (87)  5.2 5.7 5.4  Sedation Induction:  1:55     (74)   5.2 4.4 5.4 2:00     99/65 (76)  4.5 4.4 5.2 2:05     101/79 (90)  5.0 5.3 5.4    Recovery area: 2:15     92/63 (71)  5.3 4.9 5.6  Patient tolerated the procedure well.    Patient Disposition:  Transported pt to [place] 15 via bed. Report from endo nurse to bedside nurse per telephone. Pt awake, alert, wife at bedside. Dr. Edison Nasuti updated pt and wife in recovery area.

## 2015-12-14 NOTE — Discharge Summary (Signed)
Advanced Heart Failure Team  Discharge Summary   Patient ID: KARRIEM PILLAR MRN: [id number], DOB/AGE: 1940-11-30 75 y.o. Admit date: 12/08/2015 D/C date:     12/14/2015   Primary Discharge Diagnoses:  1. Fatigue - Improved with blood transfusion.  2. Recurrent GIB  -- H/O GI bleed.  -- Enteroscopy 12/15 showed bleed from jejunum requiring 3 clips  -- Enteroscopy 2/17 Normal     -- Capsule 12/11/15 2 proximal small bowel AVMs, 2 polyps - "terrible prep"     -- Enteroscopy 12/13/15 Normal, No AVMs or residual blood found. 3. Recurrent AF/AFL with controlled rate - Chemically converted with IV amio, transitioned to po for home. 4. Symptomatic Anemia - received 3 UPRBCs total 5. Chronic Systolic HF: S/P LVAD for DT 12/2012  6. OSA 7. 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 Quit smoking 2003. Uses CPAP and O2 every night. He is s/p LVAD HM II implanted 01/12/13 under DT criteria.   Over the last few years he has had multiple admits for GI bleed. Enteroscopy 08/2014 showed bleed from jejunum requiring 3 clips. Admitted again 11/2015 with recurrent GI bleed and symptomatic anemia, repeat Enterosocopy 11/09/15 unremarkable.  He presented to [facility] 12/08/15 with several days weakness and nausea. Hgb on admit 9.7 down from 11.1 at recent outpatient visit. Also noted to be in AFL with slow VR. Admitted for further evaluation and treatment with possible GI work up.   He was started on IV amio for AFL.  Not candidate for DC-CV with active GI bleed and possible need to hold [facility].[name]  He  converted to NSR on amio400 po bid. Will receive this dose x 7 days and then decrease back to 400 mg daily on 12/18/15.  GI consulted and decided to first proceed with Capsule endo after bowel prep. 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   Push Enteroscopy 12/13/15 showed Normal esophagus, stomach, duodenum and no evidence of significant pathology in the entire examined portion of jejunum. Proximal small bowel AVMs described on capsule endo were not visualized and no blood found in the UGI tract. Thought to be stable for discharge from a GI perspective. He was kept overnight with further Blood transfusion with Hgb drop from 9.6 -> 8.4 overnight.   Overall, he required 3 UPRBCs total (3/11, 3/13, 3/15) with Hgbs as low as 8.0-8.2 with continue melanic stools.   On 12/14/15 Hgb remained stable after UPRBCs previous day and he had no further BMs to assess for melena.  With stable Hgb and unremarkable Enteroscopy thought to be stable for discharge. [facility] get [facility]  It was decided to switch his anticoagulation from warfarin to eliquis with hopes of less GI bleeding based on clinical evidence of Warfarin vs Eliquis. HF team pharmacists will continue working on getting patient Octreotide as an outpatient.   Losartan was held with weakness and dizziness on discharge. He did not receive this admission and Maps remained in 70-80s.  Will re-assess at next VAD follow up for resumption. Do not BP to drop once home and cause fall.   Volume status and VAD parameters remained stable throughout admission.   He will be discharged to home  in stable condition with follow up to be scheduled as below.  No INR check necessary with change to eliquis.   LVAD INTERROGATION:  HeartMate II LVAD: Flow 4.5 liters/min, speed 9200, power 5.2, PI 4.4. 1 PI event  Discharge Weight Range:  213 lbs Discharge Vitals: Blood pressure 82/69, pulse 59, temperature 97.8 F (36.6 C), temperature source Oral, resp. rate 18, height 6' (1.829 m), weight 213 lb 13.5 oz (97 kg), SpO2 94 %.  Labs: Lab Results  Component Value Date    WBC 7.7 12/14/2015   HGB 10.0* 12/14/2015   HCT 33.8* 12/14/2015   MCV 97.4 12/14/2015   PLT 162 12/14/2015    Recent Labs Lab 12/11/15 0216  12/14/15 0300  NA 137  < > 139  K 3.8  < > 3.9  CL 104  < > 107  CO2 25  < > 24  BUN 17  < > 20  CREATININE 0.85  < > 1.02  CALCIUM 9.2  < > 9.5  PROT 5.2*  --   --   BILITOT 0.9  --   --   ALKPHOS 34*  --   --   ALT 22  --   --   AST 37  --   --   GLUCOSE 109*  < > 103*  < > = values in this interval not displayed. Lab Results  Component Value Date   CHOL 126 07/02/2012   HDL 40.80 07/02/2012   LDLCALC 58 07/02/2012   TRIG 134.0 07/02/2012   BNP (last 3 results)  Recent Labs  07/04/15 1147 08/22/15 1300 11/07/15 1043  BNP 267.1* 203.2* 398.1*    ProBNP (last 3 results) No results for input(s): PROBNP in the last 8760 hours.   Diagnostic Studies/Procedures   No results found.  Discharge Medications     Medication List    STOP taking these medications        losartan 100 MG tablet  Commonly known as:  COZAAR     warfarin 5 MG tablet  Commonly known as:  COUMADIN      TAKE these medications        amiodarone 200 MG tablet  Commonly known as:  PACERONE  Take 400 mg (2 tablets) twice daily until 12/17/15, then decrease back to 400 mg (2 tablets) daily.     apixaban 5 MG Tabs tablet  Commonly known as:  ELIQUIS  Take 1 tablet (5 mg total) by mouth 2 (two) times daily.     budesonide-formoterol 160-4.5 MCG/ACT inhaler  Commonly known as:  SYMBICORT  Inhale 2 puffs into the lungs 2 (two) times daily.     cholecalciferol 1000 units tablet  Commonly known as:  VITAMIN D  Take 1 tablet (1,000 Units total) by mouth 2 (two) times daily.     citalopram 40 MG tablet  Commonly known as:  CELEXA  Take 1 tablet (40 mg total) by mouth at bedtime.     docusate sodium 100 MG capsule  Commonly known as:  COLACE  Take 200 mg by mouth 2 (two) times daily.     furosemide 40 MG tablet  Commonly known as:  LASIX   Take 40 mg by mouth as needed for fluid. Reported on 11/21/2015     hydrocortisone 2.5 % rectal cream  Commonly known as:  ANUSOL-HC  Place 1 application rectally 2 (two) times daily.     ipratropium-albuterol 0.5-2.5 (3) MG/3ML Soln  Commonly known as:  DUONEB  Take 3  mLs by nebulization every 6 (six) hours as needed (copd).     levothyroxine 100 MCG tablet  Commonly known as:  SYNTHROID, LEVOTHROID  Take 100 mcg by mouth at bedtime.     MULTIVITAMIN PO  Take 1 tablet by mouth daily.     nitroGLYCERIN 0.4 MG SL tablet  Commonly known as:  NITROSTAT  Place 0.4 mg under the tongue every 5 (five) minutes as needed for chest pain.     pantoprazole 40 MG tablet  Commonly known as:  PROTONIX  Take 1 tablet (40 mg total) by mouth 2 (two) times daily.     potassium chloride 10 MEQ tablet  Commonly known as:  K-DUR  Take 10 mEq by mouth as needed (when taking furosemide).     simvastatin 80 MG tablet  Commonly known as:  ZOCOR  Take 0.5 tablets (40 mg total) by mouth at bedtime.     tiotropium 18 MCG inhalation capsule  Commonly known as:  SPIRIVA  Place 1 capsule (18 mcg total) into inhaler and inhale daily.     vitamin B-12 1000 MCG tablet  Commonly known as:  CYANOCOBALAMIN  Take 1,000 mcg by mouth at bedtime.     vitamin C 500 MG tablet  Commonly known as:  ASCORBIC ACID  Take 1 tablet (500 mg total) by mouth 2 (two) times daily.        Disposition   The patient will be discharged in stable condition to home. Discharge Instructions    Diet - low sodium heart healthy    Complete by:  As directed      Heart Failure patients record your daily weight using the same scale at the same time of day    Complete by:  As directed      Increase activity slowly    Complete by:  As directed           Follow-up Information    Follow up with [facility].   Specialty:  Cardiology   Why:  We will call with appointment. If you have  not heard from [place] by 12/18/15, please call for appointmnent   Contact information:   [id number] [place] [phone]       Duration of Discharge Encounter: Greater than 35 minutes   Signed, Annamaria Helling 12/14/2015, 1:11 PM  Patient seen and examined with Oda Kilts, PA-C. We discussed all aspects of the encounter. I agree with the assessment and plan as stated above. I have edited the note with my changes.  Asani Deniston,MD 1:25 PM

## 2015-12-14 NOTE — Care Management Important Message (Signed)
Important Message  Patient Details  Name: [name] MRN: [id number] Date of Birth: [birth year]   Medicare Important Message Given:  Yes    [name] [name] 12/14/2015, 2:55 PM

## 2015-12-14 NOTE — Progress Notes (Signed)
Spoke w pt and wife. Gave them 30day free eliquis card. [facility]. [facility] to [facility] cardiolist at [facility] Ceasar Lund about sandostatin to see if she can help get it approved w cardiologist. Loney Loh at [facility] working on getting approved also. Pt for dc home today

## 2015-12-14 NOTE — Progress Notes (Signed)
All discharge instructions given to pt, IVs removed, LVAD batteries checked and pt discharged with wife.

## 2015-12-14 NOTE — Progress Notes (Signed)
Patient ID: [name], male   DOB: [birth year], 75 y.o.   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   Proximal small bowel AVMs described on capsule endo were not visualized and no blood found in the UGI tract.  No BMs since yesterday. Denies lightheadedness, palpitations, CP, or SOB.   Hgb 8.0 12/11/15 -> 1UPRBCs --> 9.6 --> 8.4 -> 1UPRBCs -> 9.9 -> 10.0   LVAD INTERROGATION:  HeartMate II LVAD: Flow 4.5  liters/min, speed 9200, power 5.2, PI 4.4. 1 PI event   Objective:    Vital Signs:   Temp:  [97.9 F (36.6 C)-98.5 F (36.9 C)] 98.3 F (36.8 C) (03/16 0436) Pulse Rate:  [56-60] 56 (03/15 1430) Resp:  [16-25] 18 (03/15 1650) BP: (82-109)/(63-86) 82/69 mmHg (03/15 1420) SpO2:  [92 %-98 %] 95 % (03/15 2352) Weight:  [213 lb 13.5 oz (97 kg)] 213 lb 13.5 oz (97 kg) (03/16 0436)   Mean arterial Pressure 70-80s  Intake/Output:   Intake/Output Summary (Last 24 hours) at 12/14/15 0721 Last data filed at 12/13/15 1650  Gross per 24 hour  Intake    335 ml  Output      0 ml  Net    335 ml     Physical Exam: General: NAD.  No resp difficulty. In bed.  HEENT: normal. L neck dressing CDI Neck: supple. JVP 6-7 cm. Carotids 2+ bilat; no bruits. No thyromegaly or nodule noted.  Cor: Mechanical heart sounds with LVAD hum present. Lungs: Clear Abdomen: obese, soft, non-tender, non-distended, no HSM. No bruits or masses. +BS  Driveline: C/D/I; securement device intact and driveline incorporated Extremities: no cyanosis, clubbing, rash. No edema Neuro: alert & orientedx3, cranial nerves grossly intact. moves all 4 extremities w/o difficulty. Affect pleasant  Telemetry- NSR 50-60s with PVCs.     Labs: Basic Metabolic Panel:  Recent Labs Lab 12/10/15 0521 12/11/15 0216 12/12/15 0231 12/13/15 0300 12/14/15 0300  NA 136 137 138 138 139  K 4.2 3.8 3.6 3.6 3.9  CL 104 104 103 105 107  CO2 23 25 26 25 24   GLUCOSE 119* 109* 112* 143* 103*  BUN 17 17 18 19 20   CREATININE 0.94 0.85 1.14 0.97 1.02  CALCIUM 9.6 9.2 9.8 9.2 9.5  MG  --   --   --  2.0  --   PHOS  --   --   --  3.1  --     Liver Function Tests:  Recent Labs Lab 12/09/15 0258 12/10/15 0521 12/11/15 0216  AST 36 38 37  ALT 17 22 22   ALKPHOS 39 40 34*  BILITOT 0.4 0.6 0.9  PROT 5.4* 5.5* 5.2*  ALBUMIN 3.1* 3.4* 3.0*   No results for input(s): LIPASE, AMYLASE in the last 168 hours. No results for input(s): AMMONIA in the last 168 hours.  CBC:  Recent Labs Lab 12/09/15 0258  12/11/15 1342 12/12/15 0231 12/13/15 0300 12/13/15 1830 12/14/15 0300  WBC 7.1  < > 7.5 9.0 6.4 8.2 7.7  NEUTROABS 5.6  --   --   --   --   --   --   HGB 8.2*  < > 8.0* 9.6* 8.4* 9.9* 10.0*  HCT  27.3*  < > 26.6* 31.7* 27.5* 32.6* 33.8*  MCV 101.1*  < > 99.3 98.4 98.2 97.6 97.4  PLT 153  < > 152 179 142* 182 162  < > = values in this interval not displayed.  INR:  Recent Labs Lab 12/10/15 0521 12/11/15 0216 12/12/15 0231 12/13/15 0300 12/14/15 0300  INR 1.98* 2.61* 2.85* 2.41* 1.73*    Other results:    Imaging: No results found.   Medications:     Scheduled Medications: . sodium chloride   Intravenous Once  . amiodarone  400 mg Oral BID  . atorvastatin  40 mg Oral q1800  . cholecalciferol  1,000 Units Oral BID  . docusate sodium  200 mg Oral BID  . hydrocortisone  1 application Rectal BID  . levothyroxine  100 mcg Oral QHS  . mometasone-formoterol  2 puff Inhalation BID  . multivitamin with minerals  1 tablet Oral Daily  . pantoprazole  40 mg Oral BID  . tiotropium  18 mcg Inhalation Daily  . vitamin B-12  1,000 mcg Oral QHS  . vitamin C  500 mg Oral BID  . warfarin  2.5 mg Oral ONCE-1800  .  Warfarin - Pharmacist Dosing Inpatient   Does not apply q1800    Infusions: . sodium chloride Stopped (12/11/15 0414)    PRN Medications: ipratropium-albuterol, nitroGLYCERIN, potassium chloride   Assessment:   1. Fatigue - likely due to GIB 2. Recurrent GIB     -- H/O GI bleed.     -- Enteroscopy 12/15 showed bleed from jejunum requiring 3 clips     -- Enteroscopy 2/17 normal 3. Recurrent AF/AFL with controlled rate 4. Symptomatic Anemia 5. Chronic Systolic HF: S/P LVAD for DT 12/2012  6. OSA 7. NSVT  Plan/Discussion:    Appears to have recurrent GI bleed. INR now 2.85  Off ASA previously. Recent enteroscopy normal. 1 unit PRBCs 3/11 with appropriate hemoglobin bump. Received 1UPRBC 3/13 with appropriate rise from 8.0>9.6 then back down to 8.4. 10.0 this am after one further [facility] [place] yesterday.    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    Push Enteroscopy 12/14/15  unremarkable. AVMs not visualized. No source of bleeding identified, no blood found in UGI tract on this exam.   - Continue PPI.  - Will await to hear word from Pharm D concerning octreotide prior to d/c.  - May end up switching from coumadin to eliquis.   Remains in NSR. Continue amio 400 mg BID for several more days and then can go back to dosing of 400 mg daily.   I reviewed the LVAD parameters from today, and compared the results to the patient's prior recorded data.  No programming changes were made.  The LVAD is functioning within specified parameters.  The patient performs LVAD self-test daily.  LVAD interrogation was negative for any significant power changes, alarms or PI events/speed drops.  LVAD equipment check completed and is in good working order.  Back-up equipment present.   LVAD education done on emergency procedures and precautions and reviewed exit site care.  Length of Stay: 6  Shirley Friar PA-C 12/14/2015,  7:21 AM  VAD Team --- VAD ISSUES ONLY--- Pager [phone] (7am - 7am)  Advanced Heart Failure Team  Pager [phone] (M-F; 7a - 4p)  Please contact [facility] for night-coverage after hours (4p -7a ) and weekends on [email or web address]  Patient seen and examined with  Oda Kilts, PA-C. We discussed all aspects of the encounter. I agree with the assessment and plan as stated above.   Unable to get octrotide at this point. Will switch warfarin to Eliquis and follow closely. No ASA.   Bensimhon, Daniel,MD 12:48 PM
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
## 2015-12-20 ENCOUNTER — Encounter (HOSPITAL_COMMUNITY): Payer: Non-veteran care

## 2015-12-21 ENCOUNTER — Other Ambulatory Visit (HOSPITAL_COMMUNITY): Payer: Self-pay | Admitting: *Deleted

## 2015-12-21 ENCOUNTER — Ambulatory Visit (HOSPITAL_COMMUNITY)
Admission: RE | Admit: 2015-12-21 | Discharge: 2015-12-21 | Disposition: A | Discharge status: Home / Self Care | Payer: No Typology Code available for payment source | Source: Ambulatory Visit | Attending: Cardiology | Admitting: Cardiology

## 2015-12-21 ENCOUNTER — Other Ambulatory Visit (HOSPITAL_COMMUNITY): Payer: Self-pay | Admitting: Adult Health

## 2015-12-21 VITALS — BP 100/0 | HR 63 | Ht 73.0 in | Wt 223.6 lb

## 2015-12-21 DIAGNOSIS — Z7901 Long term (current) use of anticoagulants: Secondary | ICD-10-CM | POA: Insufficient documentation

## 2015-12-21 DIAGNOSIS — I255 Ischemic cardiomyopathy: Secondary | ICD-10-CM | POA: Diagnosis not present

## 2015-12-21 DIAGNOSIS — D508 Other iron deficiency anemias: Secondary | ICD-10-CM

## 2015-12-21 DIAGNOSIS — D649 Anemia, unspecified: Secondary | ICD-10-CM | POA: Insufficient documentation

## 2015-12-21 DIAGNOSIS — Z95811 Presence of heart assist device: Secondary | ICD-10-CM

## 2015-12-21 DIAGNOSIS — D696 Thrombocytopenia, unspecified: Secondary | ICD-10-CM | POA: Diagnosis not present

## 2015-12-21 DIAGNOSIS — Z951 Presence of aortocoronary bypass graft: Secondary | ICD-10-CM | POA: Diagnosis not present

## 2015-12-21 DIAGNOSIS — G4733 Obstructive sleep apnea (adult) (pediatric): Secondary | ICD-10-CM | POA: Insufficient documentation

## 2015-12-21 DIAGNOSIS — I480 Paroxysmal atrial fibrillation: Secondary | ICD-10-CM | POA: Diagnosis not present

## 2015-12-21 DIAGNOSIS — Z79899 Other long term (current) drug therapy: Secondary | ICD-10-CM

## 2015-12-21 DIAGNOSIS — I5022 Chronic systolic (congestive) heart failure: Secondary | ICD-10-CM | POA: Diagnosis present

## 2015-12-21 DIAGNOSIS — F329 Major depressive disorder, single episode, unspecified: Secondary | ICD-10-CM | POA: Insufficient documentation

## 2015-12-21 DIAGNOSIS — D509 Iron deficiency anemia, unspecified: Secondary | ICD-10-CM

## 2015-12-21 DIAGNOSIS — K625 Hemorrhage of anus and rectum: Secondary | ICD-10-CM | POA: Diagnosis not present

## 2015-12-21 DIAGNOSIS — E039 Hypothyroidism, unspecified: Secondary | ICD-10-CM | POA: Diagnosis not present

## 2015-12-21 DIAGNOSIS — I2510 Atherosclerotic heart disease of native coronary artery without angina pectoris: Secondary | ICD-10-CM | POA: Diagnosis not present

## 2015-12-21 DIAGNOSIS — E669 Obesity, unspecified: Secondary | ICD-10-CM | POA: Insufficient documentation

## 2015-12-21 DIAGNOSIS — E785 Hyperlipidemia, unspecified: Secondary | ICD-10-CM | POA: Insufficient documentation

## 2015-12-21 DIAGNOSIS — Z6829 Body mass index (BMI) 29.0-29.9, adult: Secondary | ICD-10-CM | POA: Insufficient documentation

## 2015-12-21 DIAGNOSIS — I252 Old myocardial infarction: Secondary | ICD-10-CM | POA: Insufficient documentation

## 2015-12-21 DIAGNOSIS — R0902 Hypoxemia: Secondary | ICD-10-CM | POA: Diagnosis not present

## 2015-12-21 DIAGNOSIS — I110 Hypertensive heart disease with heart failure: Secondary | ICD-10-CM | POA: Diagnosis not present

## 2015-12-21 DIAGNOSIS — Z87891 Personal history of nicotine dependence: Secondary | ICD-10-CM | POA: Insufficient documentation

## 2015-12-21 DIAGNOSIS — J449 Chronic obstructive pulmonary disease, unspecified: Secondary | ICD-10-CM | POA: Insufficient documentation

## 2015-12-21 LAB — CBC
HCT: 28.1 % — ABNORMAL LOW (ref 39.0–52.0)
HEMOGLOBIN: 8 g/dL — AB (ref 13.0–17.0)
MCH: 27.5 pg (ref 26.0–34.0)
MCHC: 28.5 g/dL — ABNORMAL LOW (ref 30.0–36.0)
MCV: 96.6 fL (ref 78.0–100.0)
Platelets: 214 10*3/uL (ref 150–400)
RBC: 2.91 MIL/uL — AB (ref 4.22–5.81)
RDW: 18.6 % — ABNORMAL HIGH (ref 11.5–15.5)
WBC: 7.8 10*3/uL (ref 4.0–10.5)

## 2015-12-21 LAB — BASIC METABOLIC PANEL
ANION GAP: 6 (ref 5–15)
BUN: 18 mg/dL (ref 6–20)
CHLORIDE: 109 mmol/L (ref 101–111)
CO2: 24 mmol/L (ref 22–32)
Calcium: 9.6 mg/dL (ref 8.9–10.3)
Creatinine, Ser: 1.21 mg/dL (ref 0.61–1.24)
GFR calc Af Amer: >60 mL/min (ref >60)
GFR calc non Af Amer: 57 mL/min — ABNORMAL LOW (ref >60)
GLUCOSE: 108 mg/dL — AB (ref 65–99)
POTASSIUM: 4.2 mmol/L (ref 3.5–5.1)
Sodium: 139 mmol/L (ref 135–145)

## 2015-12-21 LAB — LACTATE DEHYDROGENASE: LDH: 228 U/L — AB (ref 98–192)

## 2015-12-21 NOTE — Progress Notes (Signed)
Patient ID: [name], male   DOB: 03-Oct-1940, 75 y.o.   MRN: [id number]    LVAD CLINIC NOTE Symptom  Yes  No  Details   Angina    x Activity:   Claudication     x How far: Left leg "giving out"  Syncope    x When: dizzy with orthostatic changes  Stroke    x   Orthopnea    x How many pillows: 2 for comfort  PND    x How often:  CPAP   x  How many hrs: All night; no O2  Pedal edema    x   Abd fullness    x   N&V    x Appetite decreased, but can eat a full meal   Diaphoresis     x When:   Bleeding    x Dark tarry stool - Tuesday  Urine color   medium yellow  SOB   x  Activity: Incline; one block level ground; fatigue; legs "feel like rubber"  Palpitations    x When:  ICD shock    N/A   Hospitlizaitons   x   When/where/why: 3/6 - 12/14/15  ED visit     x When/where/why:   Other MD     x When/who/why:   Activity    No formal activity; sedentary  Fluid    No limitations  Diet    No limitations   Vital signs: HR: 63 Doppler modified systolic: 123XX123 Auto cuff: [id number] (84) O2 Sat: 98% Wt: 223.6 lbs Last weight: 235.8 lbs Ht: 6'1"  LVAD interrogation reveals (reviewed with Dr Aundra Dubin):  Speed: 9190 Flow: 5.0 Power: 5.5 PI: 6.0 Alarms: none Events: rare PI event  Fixed speed: 9200 Low speed limit: 8600 Primary Controller: Replace back up battery in 30 months  Back up controller: Replace back up battery in 30 months   LVAD exit site:  Well healed and incorporated. The velour is fully implanted at exit site. Dressing dry and intact. No erythema or drainage. Stabilization device present and accurately applied. Driveline dressing is being changed weekly per sterile technique using Sorbaview dressing without biopatch (causes skin  irritation) exit site. Pt denies fever or chills. Dressing supplies provided to patient.   Encounter Details: Presents to clinic today with his wife for hospital discharge follow up; hospital admission 3/10 - 12/14/15 for symptomatic anemia. Reports feeling well earlier this week, but today feels tired and SOB. Reports one dark black tarry stool Tuesday evening of this week. Has not taken lasix since discharge.  Hgb --> 8.0 (time of discharge 10.0). Type and screen obtained in clinic today; will go to Short Stay tomorrow at 9 am for two units PC's and one dose of IV iron.   INR --> stopped coumadin in hospital; pt on Apixaban 5 mg bid.   LDH --> 228 . Stable with established baseline.   MAP--> 100 doppler modified systolic and [id number] (94); repeat 102/70 (84)  EKG obtained and reviewed by Dr. Aundra Dubin, pt in sinus rhythm today.   Reviewed and demonstrated the following to patient and caregiver:       Reviewed the steps for replacing the running system controller with the back-up system controller (see patient handbook section 2 or the appropriate pamphlet).    X  Demonstrated (using the mock-driveline and controller) how to connect and disconnect the mock-driveline in back up system controller in a timely manner (less than 10 seconds) with return demonstration by patient and caregiver.  X   Reviewed system controller alarms and troubleshooting including hazard and advisory alarms and accessing alarm history. Re-enforced NOT TO attempt to perform any task displayed on the display screen alone or without calling the VAD pager [phone] (see patient handbook section 5 or the appropriate pamphlet).       X  Reviewed how to handle an emergency including when the pump is running and when the pump has stopped (see patient handbook section 8). Call 911 first and then the VAD pager at [phone].    X   Reviewed 14-volt  lithium ion battery calibration steps (see patient handbook section 3).   X   Reviewed contents of black bag and what must be with patient at all times.   X  Reviewed driveline exit site including cleansing, dressing, and immobilizing with an anchor device to prevent exit site trauma.    X  Reviewed weekly maintenance which includes: Reviewing "Replacing the [place] with a [employer]" pamphlet. Clean batteries, clips, and battery charger contacts. Check cables for damages. Rotate batteries; keep all 8 charged.     X  Reviewed monthly maintenance which includes: Reviewing Alarms and Troubleshooting. Check battery manufacturer dates. Check use/charge cycles for each battery; remember to re-calibrate every 70 uses when prompted.     X  Additional pamphlets Guide to Replacing the [place] with the [place] for Patients and Their Caregivers and HM II Alarms for Patients and Their Caregivers given to patient.     X   Batteries Manufacture Date: Number of uses: Re-calibration  10/2012 260 - 265 Performed by patient   Annual maintenance completed per [employer] on patient's home power module and universal Charity fundraiser.   Backup system controller 11 volt battery charged during visit.  3 year Intermacs follow up completed including: Quality of Life, KCCQ-12, and Neurocognitive trail making (attempted, unable to complete).   Pt unable to do 6 minute walk today due to symptomatic anemia.   Patient Instructions: 1. No change in meds. 2. Report to admissions tomorrow at 8:45 am for two units of blood and one dose of IV iron in [facility]. 3. Return on 01/01/16 for labs; call if you are feeling bad and we can check BP, VAD parameters, and EKG. 4. Return to [place] clinic in one month.  Zada Girt, RN VAD Coordinator  Office: [phone] 24/7 VAD Pager:  [phone]             [facility] [place] (Dr. 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 Quit smoking 2003. Uses CPAP and O2 every night. He is s/p LVAD HM II implanted 01/12/13 under DT criteria  Admitted to [facility] 09/13/13 - 09/14/13 for increased fatigue and dyspnea. Found to have mild CHF and anemia. Diuresed with IV lasix and transitioned to PO lasix 40 mg twice a week. Discharge weight 203 lbs on inpatient scale.  Admitting labs revealed Hgb 7.7 for which he received 2 units PCs with appropriate rise in Hgb.   Admit 6/8-6/12 for symptomatic anemia. Had EGD/Colonoscopy which was unrevealing.  6 mm polyp clipped and removed. CT abdomen showed cirrhosis. Placed on heparin bridge. Capsule study pending. Sent home on lovenox. D/C weight 206 lbs.   Admitted to [facility] with with GI bleed. GI consulted. Push enteroscopy 10/28  negative. Colonoscopy 10/29 without source of bleeding.Result of capsule endoscopy--> Showed bleeding from duodenum. EGD--->Bleeding from duodenum with clip applied. Overall he received  10 UPRBCs. Discharge weight was 207 pounds.   Admitted to [facility] 09/21/14 with GI bleed . GI consulted. Enteroscopy showed bleed from jejunum requiring 3 clips. Overall he received 4 units PRBCs.  He went back in A fib and had DC-CV.  On the day discharge he was back in NSR on amiodarone 200 mg twice a day. Discharge weight was 214 pounds.   Admitted in 6/16 for ICD extraction due to pocket infection. Did well. No complications.   Admitted 2/17 with BRPBR.  Ultimately, thought to be due to epistaxis + hemorrhoidal bleeding.  Enteroscopy was normal during this hospitalization.  No further bleeding since discharge. Feels like he has been in atrial fibrillation occasionally but is in NSR  today.  He was in atrial fibrillation during 2/17 hospitalization, and amiodarone was increased to 400 mg daily.  MAP in 90s.  No dyspnea walking on flat ground.  Has not used Lasix.   Admitted 12/08/2015 with symptomatic anemia. GI consulted and performed  Capsule 12/11/15 2 proximal small bowel AVMs, 2 polyps and enteroscopy 12/13/15 Normal, No AVMs or residual blood found. Received 3UPRBCs. He had recurrent A fib but chemically converted with amio drip. Later transitioned to amio 400 mg bid. Losartan was held due to low MAP and dizziness.   Today he returns for post hospital follow up. Complaining of fatigue and mild dyspnea with exertion. Has not required diuretics. Had black stool the few days. He is not taking iron. Appetite ofk.   Denies LVAD alarms. Denies driveline trauma, erythema or drainage.  Denies ICD shocks. Reports taking Coumadin as prescribed and adherence to anticoagulation based dietary restrictions. Denies bright red blood per rectum or melena, no dark urine or hematuria.   ECG: NSR, 62 bpm   LVAD interrogation reveals:  See nurse's note for details.  Labs (2/16): LDL 305, HCT 38.9 Labs (3/16): K 3.9, creatinine 1.05, LFTs normal Labs (2/17): WBCs 11.2, INR 2.68.   Past Medical History  Diagnosis Date  . Ischemic cardiomyopathy      CABG 2003, PCI 2007  EF 27%(myoview 2012  . Chronic systolic heart failure ([place])   . Hyperlipidemia   . Hypothyroidism   . Chronic anticoagulation     Afib and LVAD  . Obesity   . COPD (chronic obstructive pulmonary disease) ([place])   . Asbestosis(501)     "6 years in the [place]" (05/24/2013)  . Atrial fibrillation ([place])     permanent  . Paroxysmal ventricular tachycardia ([place])   . Coronary artery disease   . Hypertension   . Depression   . LVAD (left ventricular assist device) present ([place]) 12/2012  . Epistaxis 11/2015, 07/2014  . Cancer ([place])     "scraped some off behind my left ear; fast moving; having it cut out 12/05/2015" (11/07/2015)   . CHF (congestive heart failure) ([place])   . Myocardial infarction ([place]) 1990's-2000    "2 in  ~ the 1990's; 1 in ~ 2000" (11/07/2015)  . On home oxygen therapy     "have it available; don't use it" (11/07/2015)  . OSA on CPAP   . Anemia   . History of blood transfusion 08/2015 X 10; 11/2015 X 2    "related to bleeding on the inside somewhere" (11/07/2015)  . Complication of anesthesia     "they can't put me all the way to sleep  cause of my heart" (11/07/2015)    Current Outpatient Prescriptions  Medication Sig Dispense Refill  . amiodarone (PACERONE) 200 MG tablet Take 400 mg (2 tablets) twice daily until 12/17/15, then decrease back to 400 mg (2 tablets) daily. 90 tablet 3  . apixaban (ELIQUIS) 5 MG TABS tablet Take 1 tablet (5 mg total) by mouth 2 (two) times daily. 60 tablet 6  . budesonide-formoterol (SYMBICORT) 160-4.5 MCG/ACT inhaler Inhale 2 puffs into the lungs 2 (two) times daily. (Patient taking differently: Inhale 2 puffs into the lungs daily. ) 1 Inhaler 3  . cholecalciferol (VITAMIN D) 1000 UNITS tablet Take 1 tablet (1,000 Units total) by mouth 2 (two) times daily. 180 tablet 3  . citalopram (CELEXA) 40 MG tablet Take 1 tablet (40 mg total) by mouth at bedtime. 90 tablet 3  . docusate sodium (COLACE) 100 MG capsule Take 200 mg by mouth 2 (two) times daily.    . hydrocortisone (ANUSOL-HC) 2.5 % rectal cream Place 1 application rectally 2 (two) times daily. 30 g 6  . ipratropium-albuterol (DUONEB) 0.5-2.5 (3) MG/3ML SOLN Take 3 mLs by nebulization every 6 (six) hours as needed (copd).    [name] levothyroxine (SYNTHROID, LEVOTHROID) 100 MCG tablet Take 100 mcg by mouth at bedtime.     . Multiple Vitamins-Minerals (MULTIVITAMIN PO) Take 1 tablet by mouth daily.     . pantoprazole (PROTONIX) 40 MG tablet Take 1 tablet (40 mg total) by mouth 2 (two) times daily. 180 tablet 3  . simvastatin (ZOCOR) 80 MG tablet Take 0.5 tablets (40 mg total) by mouth at bedtime. 45 tablet 3  . tiotropium  (SPIRIVA) 18 MCG inhalation capsule Place 1 capsule (18 mcg total) into inhaler and inhale daily. 90 capsule 3  . vitamin B-12 (CYANOCOBALAMIN) 1000 MCG tablet Take 1,000 mcg by mouth at bedtime.    . vitamin C (ASCORBIC ACID) 500 MG tablet Take 1 tablet (500 mg total) by mouth 2 (two) times daily. 180 tablet 3  . furosemide (LASIX) 40 MG tablet Take 40 mg by mouth as needed for fluid. Reported on 12/21/2015    . nitroGLYCERIN (NITROSTAT) 0.4 MG SL tablet Place 0.4 mg under the tongue every 5 (five) minutes as needed for chest pain. Reported on 12/21/2015    . potassium chloride (K-DUR) 10 MEQ tablet Take 10 mEq by mouth as needed (when taking furosemide). Reported on 12/21/2015     No current facility-administered medications for this encounter.    Review of patient's allergies indicates no known allergies.  REVIEW OF SYSTEMS: All systems negative except as listed in HPI, PMH and Problem list.   Filed Vitals:   12/21/15 1123 12/21/15 1126  BP: 102/70 100/0  Pulse: 63   Height: 6\' 1"  (1.854 m)   Weight: 223 lb 9.6 oz (101.424 kg)   SpO2: 98%   MAP 98  GENERAL: Pale appearing, male; NAD; wife present. Ambulated in the clinic without difficulty.  HEENT: normal  NECK: Supple, JVP 6-7 no bruits.  No lymphadenopathy or thyromegaly appreciated.   CARDIAC: Mechanical heart sounds with LVAD hum present.  LUNGS:  Clear to auscultation bilaterally.  ABDOMEN:  Obese, Soft, round, nontender, positive bowel sounds x4.     LVAD exit site: well-healed and incorporated.  Dressing dry and intact.  No erythema, drainage, odor or tenderness.  Stabilization device present and accurately applied.  Driveline dressing is being changed daily per sterile technique. Changed in clinic EXTREMITIES:  Warm and dry, no cyanosis, clubbing, no edema NEUROLOGIC:  Alert and oriented x 4.  Gait steady.  No aphasia.  No dysarthria.  Affect pleasant.    EKG : NSR 62 bpm   ASSESSMENT AND PLAN:  1. Chronic Systolic HF:  s/p LVAD implant 12/2012 for DT.  NYHA II-III. He is not volume overloaded.  Not on BB due to previous bradycardia.  - Off losartan with recent hypotension and slow GI bleed.     -Continue diuretics as needed.  - Reinforced the need and importance of daily weights, a low sodium diet, and fluid restriction (less than 2 L a day). Instructed to call the HF clinic if weight increases more than 3 lbs overnight or 5 lbs in a week.  2.  Anticoagulation management: INR goal 1.8-2.3 with history of GI bleeding.  He is not on ASA because of bleeding.  3. Anemia/ GIB:  Upper GI bleed in 09/21/14, had bleeding noted in jejunum requiring 3 clips.  Recent bleed thought to be due to epistaxis + possible hemorrhoidal bleeding.  We discussed banding the hemorrhoids.  Today hemoglobin is back down to 8. Offered hospital admit however he would like to try outpatient infusion. Set up for 2UPRBCs with IV Feraheme tomorrow. Continue stool softner.  4. LVAD: MAP stable 84. Check next week.  -Off losartan for now with low MAPS in hospital. Can restart later.     - Check CBC, BMET, LDH and INR today.   5. PAF: Paroxysmal.  He is in NSR today, feels occasional atrial fibrillation. Continue amiodarone 400 mg daily for now. Follow LFTs and TSH.  Should have yearly eye exam.    6. Depression: Stable.  Continue Celexa 40 mg daily 7. OSA: Using CPAP.  8. Hypoxemia:  Uses home oxygen at night.  This may be due to his baseline COPD. 9. ICD infection - s/p extraction 6/16. No complications.   Follow up tomorrow 12/22/15 in short stay for 2UPRBCs and Feraheme.   Kidada Ging NP-C  12/21/2015

## 2015-12-21 NOTE — Patient Instructions (Signed)
1.  No change in meds. 2.  Report to admissions tomorrow at 8:45 am for two units of blood and one dose of IV iron in [facility]. 3.  Return on 01/01/16 for labs; call if you are feeling bad and we can check BP, VAD parameters, and EKG. 4.  Return to [place] clinic in one month.

## 2015-12-21 NOTE — Progress Notes (Signed)
Symptom  Yes  No  Details   Angina         x Activity:   Claudication                x How far:  Left leg "giving out"  Syncope         x When: dizzy with orthostatic changes  Stroke         x   Orthopnea         x How many pillows:   2 for comfort  PND         x How often:  CPAP         x  How many hrs:  All night; no O2  Pedal edema         x   Abd fullness         x   N&V         x Appetite decreased, but can eat a full meal   Diaphoresis                x When:   Bleeding               x Dark tarry stool - Tuesday  Urine color    medium yellow  SOB        x  Activity:  Incline; one block level ground; fatigue; legs "feel like rubber"  Palpitations         x When:  ICD shock         N/A   Hospitlizaitons        x        When/where/why:  3/6 - 12/14/15  ED visit               x When/where/why:   Other MD              x When/who/why:    Activity    No formal activity; sedentary  Fluid    No limitations  Diet    No limitations   Vital signs: HR:  63 Doppler modified systolic: 123XX123 Auto cuff:  102/70 (84) O2 Sat: 98% Wt:  223.6  lbs Last weight:  235.8  lbs Ht: 6'1"  LVAD interrogation reveals (reviewed with Dr Aundra Dubin):  Speed: 9190 Flow:  5.0 Power: 5.5 PI:  6.0 Alarms: none Events: rare PI event       Fixed speed:  9200 Low speed limit: 8600 Primary Controller:  Replace back up battery in 30 months  Back up controller:   Replace back up battery in 30 months   LVAD exit site:  Well healed and incorporated. The velour is fully implanted at exit site. Dressing dry and intact. No erythema or drainage. Stabilization device present and accurately applied. Driveline dressing is being changed weekly per sterile technique using Sorbaview dressing without biopatch (causes skin irritation) exit site. Pt denies fever or chills. Dressing supplies provided to patient.   Encounter Details: Presents to clinic today with his wife for hospital discharge follow up; hospital admission 3/10  - 12/14/15 for symptomatic anemia. Reports feeling well earlier this week, but today feels tired and SOB. Reports one dark black tarry stool Tuesday evening of this week. Has not taken lasix since discharge.  Hgb --> 8.0  (time of discharge 10.0).  Type and screen obtained in clinic today; will go to Short Stay tomorrow at 9 am for two units PC's and one dose of IV iron.  INR --> stopped coumadin in hospital; pt on Apixaban 5 mg bid.   LDH --> 228 . Stable with established baseline.   MAP--> 100 doppler modified systolic and [id number] (94); repeat 102/70 (84)  EKG obtained and reviewed by Dr. Aundra Dubin, pt in sinus rhythm today.   Reviewed and demonstrated the following to patient and caregiver:               Reviewed the steps for replacing the running system controller with the back-up system controller (see patient handbook section 2 or the appropriate pamphlet).             X  Demonstrated (using the mock-driveline and controller) how to connect and disconnect the mock-driveline in back up system controller in a timely manner (less than 10 seconds) with return demonstration by patient and caregiver.              X   Reviewed system controller alarms and troubleshooting including hazard and advisory alarms and accessing alarm history. Re-enforced NOT TO attempt to perform any task displayed on the display screen alone or without calling the VAD pager [phone] (see patient handbook section 5 or the appropriate pamphlet).                       X  Reviewed how to handle an emergency including when the pump is running and when the pump has stopped (see patient handbook section 8). Call 911 first and then the VAD pager at [phone].             X   Reviewed 14-volt lithium ion battery calibration steps (see patient handbook section 3).            X   Reviewed contents of black bag and what must be with patient at all times.            X  Reviewed driveline exit site including  cleansing, dressing, and immobilizing with an anchor device to prevent exit site trauma.             X  Reviewed weekly maintenance which includes: Reviewing "Replacing the [place] with a [employer]" pamphlet. Clean batteries, clips, and battery charger contacts.  Check cables for damages. Rotate batteries; keep all 8 charged.              X  Reviewed monthly maintenance which includes: Reviewing Alarms and Troubleshooting. Check battery manufacturer dates. Check use/charge cycles for each battery; remember to re-calibrate every 70 uses when prompted.              X  Additional pamphlets Guide to Replacing the [place] with the [place] for Patients and Their Caregivers and HM II Alarms for Patients and Their Caregivers given to patient.              X   Batteries Manufacture Date: Number of uses: Re-calibration  10/2012 260 - 265 Performed by patient   Annual maintenance completed per [employer] on patient's home power module and universal [employer] fundraiser.    Backup system controller 11 volt battery charged during visit.   3 year Intermacs follow up completed including:  Quality of Life, KCCQ-12, and Neurocognitive trail making (attempted, unable to complete).   Pt unable to do 6 minute walk today due to symptomatic anemia.   Patient Instructions: 1.  No change in meds. 2.  Report to admissions tomorrow at 8:45 am for two units  of blood and one dose of IV iron in [facility]. 3.  Return on 01/01/16 for labs; call if you are feeling bad and we can check BP, VAD parameters, and EKG. 4.  Return to [place] clinic in one month.  Zada Girt, RN VAD Coordinator  Office: [phone] 24/7 VAD Pager: [phone]

## 2015-12-22 ENCOUNTER — Ambulatory Visit (HOSPITAL_COMMUNITY)
Admission: RE | Admit: 2015-12-22 | Discharge: 2015-12-22 | Disposition: A | Discharge status: Home / Self Care | Payer: No Typology Code available for payment source | Source: Ambulatory Visit | Attending: Cardiology | Admitting: Cardiology

## 2015-12-22 DIAGNOSIS — D649 Anemia, unspecified: Secondary | ICD-10-CM | POA: Insufficient documentation

## 2015-12-22 DIAGNOSIS — K625 Hemorrhage of anus and rectum: Secondary | ICD-10-CM | POA: Diagnosis not present

## 2015-12-22 LAB — PREPARE RBC (CROSSMATCH)

## 2015-12-22 MED ORDER — SODIUM CHLORIDE 0.9 % IV SOLN: Freq: Once | INTRAVENOUS | Status: DC

## 2015-12-22 MED ORDER — SODIUM CHLORIDE 0.9 % IV SOLN
510.0000 mg | Freq: Once | INTRAVENOUS | Status: AC
  Administered 2015-12-22: 510 mg via INTRAVENOUS
  Filled 2015-12-22: qty 17

## 2015-12-22 NOTE — Progress Notes (Signed)
Pt would not wait for his post transfusion H&H.  He stated that he was going home

## 2015-12-23 LAB — TYPE AND SCREEN
ABO/RH(D): O POS
Antibody Screen: NEGATIVE
Unit division: 0
Unit division: 0

## 2015-12-25 ENCOUNTER — Other Ambulatory Visit (HOSPITAL_COMMUNITY): Payer: Self-pay | Admitting: Pharmacist

## 2015-12-25 ENCOUNTER — Encounter (HOSPITAL_COMMUNITY): Payer: Self-pay | Admitting: Pharmacist

## 2015-12-25 MED ORDER — APIXABAN 5 MG PO TABS: 5.0000 mg | ORAL_TABLET | Freq: Two times a day (BID) | ORAL | Status: AC

## 2015-12-25 MED ORDER — OCTREOTIDE ACETATE 20 MG IM KIT: 20.0000 mg | PACK | INTRAMUSCULAR | Status: DC

## 2015-12-29 ENCOUNTER — Encounter (HOSPITAL_COMMUNITY): Payer: Self-pay | Admitting: *Deleted

## 2016-01-01 ENCOUNTER — Other Ambulatory Visit (HOSPITAL_COMMUNITY): Payer: Self-pay | Admitting: Unknown Physician Specialty

## 2016-01-01 ENCOUNTER — Other Ambulatory Visit (HOSPITAL_COMMUNITY): Payer: Non-veteran care

## 2016-01-01 ENCOUNTER — Ambulatory Visit (HOSPITAL_COMMUNITY)
Admission: RE | Admit: 2016-01-01 | Discharge: 2016-01-01 | Disposition: A | Discharge status: Home / Self Care | Payer: Medicare Other | Source: Ambulatory Visit | Attending: Internal Medicine | Admitting: Internal Medicine

## 2016-01-01 ENCOUNTER — Ambulatory Visit (HOSPITAL_COMMUNITY)
Admission: RE | Admit: 2016-01-01 | Discharge: 2016-01-01 | Disposition: A | Discharge status: Home / Self Care | Payer: No Typology Code available for payment source | Source: Ambulatory Visit | Attending: Internal Medicine | Admitting: Internal Medicine

## 2016-01-01 DIAGNOSIS — D62 Acute posthemorrhagic anemia: Secondary | ICD-10-CM

## 2016-01-01 DIAGNOSIS — J849 Interstitial pulmonary disease, unspecified: Secondary | ICD-10-CM | POA: Diagnosis not present

## 2016-01-01 DIAGNOSIS — Z95811 Presence of heart assist device: Secondary | ICD-10-CM

## 2016-01-01 DIAGNOSIS — R531 Weakness: Secondary | ICD-10-CM | POA: Diagnosis not present

## 2016-01-01 DIAGNOSIS — R52 Pain, unspecified: Secondary | ICD-10-CM

## 2016-01-01 DIAGNOSIS — Z951 Presence of aortocoronary bypass graft: Secondary | ICD-10-CM | POA: Insufficient documentation

## 2016-01-01 DIAGNOSIS — I517 Cardiomegaly: Secondary | ICD-10-CM | POA: Insufficient documentation

## 2016-01-01 LAB — BASIC METABOLIC PANEL
Anion gap: 9 (ref 5–15)
BUN: 16 mg/dL (ref 6–20)
CALCIUM: 9.6 mg/dL (ref 8.9–10.3)
CHLORIDE: 107 mmol/L (ref 101–111)
CO2: 23 mmol/L (ref 22–32)
CREATININE: 1.01 mg/dL (ref 0.61–1.24)
Glucose, Bld: 105 mg/dL — ABNORMAL HIGH (ref 65–99)
Potassium: 4.4 mmol/L (ref 3.5–5.1)
SODIUM: 139 mmol/L (ref 135–145)

## 2016-01-01 LAB — CBC WITH DIFFERENTIAL/PLATELET
Basophils Absolute: 0 10*3/uL (ref 0.0–0.1)
Basophils Relative: 0 %
EOS ABS: 0.1 10*3/uL (ref 0.0–0.7)
Eosinophils Relative: 2 %
HCT: 31.6 % — ABNORMAL LOW (ref 39.0–52.0)
Hemoglobin: 8.9 g/dL — ABNORMAL LOW (ref 13.0–17.0)
LYMPHS ABS: 0.5 10*3/uL — AB (ref 0.7–4.0)
Lymphocytes Relative: 7 %
MCH: 27.6 pg (ref 26.0–34.0)
MCHC: 28.2 g/dL — ABNORMAL LOW (ref 30.0–36.0)
MCV: 97.8 fL (ref 78.0–100.0)
MONO ABS: 0.6 10*3/uL (ref 0.1–1.0)
Monocytes Relative: 9 %
NEUTROS PCT: 82 %
Neutro Abs: 5.3 10*3/uL (ref 1.7–7.7)
PLATELETS: 161 10*3/uL (ref 150–400)
RBC: 3.23 MIL/uL — AB (ref 4.22–5.81)
RDW: 21.7 % — AB (ref 11.5–15.5)
WBC: 6.5 10*3/uL (ref 4.0–10.5)

## 2016-01-01 LAB — FERRITIN: FERRITIN: 32 ng/mL (ref 24–336)

## 2016-01-01 NOTE — Addendum Note (Signed)
Encounter addended by: Christinia Gully, RN on: 01/01/2016  5:24 PM<BR>     Documentation filed: Notes Section

## 2016-01-01 NOTE — Addendum Note (Signed)
Encounter addended by: Christinia Gully, RN on: 01/01/2016  2:24 PM<BR>     Documentation filed: Notes Section

## 2016-01-01 NOTE — Progress Notes (Addendum)
Pt seen by VAD coordinator in clinic. Pt c/o of pain in his ribs/LVAD pocket. Cxray obtained, results negative for any fractures. Pump in stable condition. Hgb stable at 8.9, will recheck CBC and  type/screen on 4/6 to ensure Hgb is trending up. If pt is low we will transfuse on Friday 4/7. Pt is leaving 4/8 for the beach, pt given the numbers for the nearest VAD center in case of an emergency. Pt /wife verbalized understanding of all instructions given.

## 2016-01-02 ENCOUNTER — Other Ambulatory Visit (HOSPITAL_COMMUNITY): Payer: Self-pay | Admitting: *Deleted

## 2016-01-02 DIAGNOSIS — D649 Anemia, unspecified: Secondary | ICD-10-CM

## 2016-01-02 DIAGNOSIS — Z95811 Presence of heart assist device: Secondary | ICD-10-CM

## 2016-01-04 ENCOUNTER — Ambulatory Visit (HOSPITAL_COMMUNITY)
Admission: RE | Admit: 2016-01-04 | Discharge: 2016-01-04 | Disposition: A | Discharge status: Home / Self Care | Payer: No Typology Code available for payment source | Source: Ambulatory Visit | Attending: Cardiology | Admitting: Cardiology

## 2016-01-04 ENCOUNTER — Other Ambulatory Visit: Payer: Self-pay | Admitting: Hematology

## 2016-01-04 ENCOUNTER — Other Ambulatory Visit (HOSPITAL_COMMUNITY): Payer: Self-pay | Admitting: *Deleted

## 2016-01-04 ENCOUNTER — Telehealth (HOSPITAL_COMMUNITY): Payer: Self-pay | Admitting: *Deleted

## 2016-01-04 ENCOUNTER — Other Ambulatory Visit (HOSPITAL_COMMUNITY): Payer: Self-pay | Admitting: Adult Health

## 2016-01-04 DIAGNOSIS — D649 Anemia, unspecified: Secondary | ICD-10-CM | POA: Diagnosis present

## 2016-01-04 DIAGNOSIS — Z95811 Presence of heart assist device: Secondary | ICD-10-CM | POA: Diagnosis not present

## 2016-01-04 LAB — CBC
HEMATOCRIT: 30.3 % — AB (ref 39.0–52.0)
HEMOGLOBIN: 8.5 g/dL — AB (ref 13.0–17.0)
MCH: 26.8 pg (ref 26.0–34.0)
MCHC: 28.1 g/dL — ABNORMAL LOW (ref 30.0–36.0)
MCV: 95.6 fL (ref 78.0–100.0)
Platelets: 204 10*3/uL (ref 150–400)
RBC: 3.17 MIL/uL — AB (ref 4.22–5.81)
RDW: 20.9 % — ABNORMAL HIGH (ref 11.5–15.5)
WBC: 5.8 10*3/uL (ref 4.0–10.5)

## 2016-01-04 NOTE — Telephone Encounter (Signed)
Called pt's wife per Dr. Haroldine Laws. Instructed pt to come to [facility] tomorrow for two units PC's for Hgb today of 8.5 (down from 8.9 on Monday). Pt will check in at admissions 15 mins prior to scheduled visit. We drew type and screen at visit today in preparation for blood transfusion. VAD coordinator contacted [facility] to schedule and [name], NP will place orders. Wife verbalized understanding of same.   [name] asked that we re-fax "Request for Additional Services" to [employer] (she contacted today and prior Josem Kaufmann was given for wrong provider). Will re-fax requests for OP visits to VAD clinic and [facility] visits.

## 2016-01-05 ENCOUNTER — Ambulatory Visit (HOSPITAL_COMMUNITY)
Admission: RE | Admit: 2016-01-05 | Discharge: 2016-01-05 | Disposition: A | Discharge status: Home / Self Care | Payer: No Typology Code available for payment source | Source: Ambulatory Visit | Attending: Internal Medicine | Admitting: Internal Medicine

## 2016-01-05 ENCOUNTER — Other Ambulatory Visit (HOSPITAL_COMMUNITY): Payer: Medicare Other

## 2016-01-05 DIAGNOSIS — D649 Anemia, unspecified: Secondary | ICD-10-CM | POA: Diagnosis not present

## 2016-01-05 LAB — HEMOGLOBIN AND HEMATOCRIT, BLOOD
HEMATOCRIT: 29.9 % — AB (ref 39.0–52.0)
HEMOGLOBIN: 8.8 g/dL — AB (ref 13.0–17.0)

## 2016-01-05 LAB — PREPARE RBC (CROSSMATCH)

## 2016-01-05 MED ORDER — SODIUM CHLORIDE 0.9 % IV SOLN
Freq: Once | INTRAVENOUS | Status: AC
  Administered 2016-01-05: 11:00:00 via INTRAVENOUS

## 2016-01-06 LAB — TYPE AND SCREEN
ABO/RH(D): O POS
Antibody Screen: NEGATIVE
UNIT DIVISION: 0
Unit division: 0

## 2016-01-15 ENCOUNTER — Other Ambulatory Visit (HOSPITAL_COMMUNITY): Payer: Non-veteran care

## 2016-01-15 ENCOUNTER — Other Ambulatory Visit (HOSPITAL_COMMUNITY): Payer: Medicare Other

## 2016-01-22 ENCOUNTER — Ambulatory Visit (HOSPITAL_BASED_OUTPATIENT_CLINIC_OR_DEPARTMENT_OTHER): Payer: No Typology Code available for payment source | Admitting: Hematology

## 2016-01-22 ENCOUNTER — Ambulatory Visit (HOSPITAL_BASED_OUTPATIENT_CLINIC_OR_DEPARTMENT_OTHER): Payer: No Typology Code available for payment source

## 2016-01-22 ENCOUNTER — Other Ambulatory Visit (HOSPITAL_BASED_OUTPATIENT_CLINIC_OR_DEPARTMENT_OTHER): Payer: No Typology Code available for payment source

## 2016-01-22 ENCOUNTER — Encounter: Payer: Self-pay | Admitting: Hematology

## 2016-01-22 ENCOUNTER — Other Ambulatory Visit (HOSPITAL_COMMUNITY): Payer: Self-pay | Admitting: Unknown Physician Specialty

## 2016-01-22 VITALS — BP 99/87 | HR 53 | Temp 97.4°F | Resp 20

## 2016-01-22 VITALS — BP 90/63 | HR 46 | Temp 97.9°F | Resp 18 | Ht 72.0 in | Wt 224.3 lb

## 2016-01-22 DIAGNOSIS — I255 Ischemic cardiomyopathy: Secondary | ICD-10-CM

## 2016-01-22 DIAGNOSIS — D509 Iron deficiency anemia, unspecified: Secondary | ICD-10-CM

## 2016-01-22 DIAGNOSIS — D62 Acute posthemorrhagic anemia: Secondary | ICD-10-CM

## 2016-01-22 DIAGNOSIS — K922 Gastrointestinal hemorrhage, unspecified: Secondary | ICD-10-CM

## 2016-01-22 DIAGNOSIS — D500 Iron deficiency anemia secondary to blood loss (chronic): Secondary | ICD-10-CM

## 2016-01-22 DIAGNOSIS — Z95811 Presence of heart assist device: Secondary | ICD-10-CM | POA: Diagnosis not present

## 2016-01-22 DIAGNOSIS — D696 Thrombocytopenia, unspecified: Secondary | ICD-10-CM

## 2016-01-22 LAB — CBC WITH DIFFERENTIAL/PLATELET
BASO%: 0.5 % (ref 0.0–2.0)
Basophils Absolute: 0 10*3/uL (ref 0.0–0.1)
EOS%: 2.4 % (ref 0.0–7.0)
Eosinophils Absolute: 0.2 10*3/uL (ref 0.0–0.5)
HEMATOCRIT: 31.2 % — AB (ref 38.4–49.9)
HEMOGLOBIN: 9.4 g/dL — AB (ref 13.0–17.1)
LYMPH#: 0.5 10*3/uL — AB (ref 0.9–3.3)
LYMPH%: 7.6 % — ABNORMAL LOW (ref 14.0–49.0)
MCH: 25.8 pg — AB (ref 27.2–33.4)
MCHC: 30.3 g/dL — AB (ref 32.0–36.0)
MCV: 85 fL (ref 79.3–98.0)
MONO#: 0.5 10*3/uL (ref 0.1–0.9)
MONO%: 6.9 % (ref 0.0–14.0)
NEUT#: 5.5 10*3/uL (ref 1.5–6.5)
NEUT%: 82.6 % — ABNORMAL HIGH (ref 39.0–75.0)
Platelets: 179 10*3/uL (ref 140–400)
RBC: 3.67 10*6/uL — ABNORMAL LOW (ref 4.20–5.82)
RDW: 19.9 % — ABNORMAL HIGH (ref 11.0–14.6)
WBC: 6.6 10*3/uL (ref 4.0–10.3)

## 2016-01-22 LAB — IRON AND TIBC
%SAT: 5 % — AB (ref 20–55)
Iron: 19 ug/dL — ABNORMAL LOW (ref 42–163)
TIBC: 354 ug/dL (ref 202–409)
UIBC: 335 ug/dL (ref 117–376)

## 2016-01-22 LAB — FERRITIN: Ferritin: 18 ng/ml — ABNORMAL LOW (ref 22–316)

## 2016-01-22 MED ORDER — SODIUM CHLORIDE 0.9 % IV SOLN
510.0000 mg | Freq: Once | INTRAVENOUS | Status: AC
  Administered 2016-01-22: 510 mg via INTRAVENOUS
  Filled 2016-01-22: qty 17

## 2016-01-22 NOTE — Patient Instructions (Signed)

## 2016-01-22 NOTE — Progress Notes (Signed)
[place]  [phone] [phone]  Clinic Follow Up Note   Patient Care Team: Thressa Sheller, MD as PCP - General (Internal Medicine) Jolaine Artist, MD as Attending Physician (Cardiology) Ivin Poot, MD as Attending Physician (Cardiothoracic Surgery) 01/22/2016  CHIEF COMPLAINTS:  Follow up anemia  HISTORY OF PRESENTING ILLNESS (11/01/2014):  [name] 75 y.o. male is here because of anemia.   He has extensive cardiac history. He had multiple heart attacks in the past 15 years, he had cardiac bypass surgery in 2003, PCI in 2007, long-standing history of , a atrial fibrillationnd was found to have ischemic cardiomyopathy with EF 27% in 2012.  He initially had AICD placed, and finally had LVAD placed in 2014 for his end-stage heart failure. He has been on Coumadin for more than 10 years for the cardiac issues.   He reports he has had anemia for several years, he used to take iron pill. He also received some IV iron a few years ago. In our electronic medical records, his lab showed anemia since 12/2012.   He had 3 episodes of GI bleeding since June of 2015 which are required blood transfusion, last episode was one month ago and he required 4u RBC  at that time. His EGD showed area of bleeding from duodenum all jejunum and he was treated endoscopyly.   He feels well overall. He denies significant chest pain or abdominal discomfort. He has mild dyspnea on moderate exertion. He has mild to moderate fatigue, but is able to function well at home,  does all his ADLs and daily activities.  TREATMENT: IV Feraheme 510 mg as needed, received in 10/2014, 01/2015, 03/2015, 06/2015, 08/2015, 10/2015, 11/2015, 12/22/2015, 01/22/2016,  blood transfusion as needed if Hb<9  INTERIM HISTORY [name] returns for follow-up. He was admitted to [facility] on 12/08/2015 for severe anemia. He received 3 units blood transfusion and underwent endoscopy and Still endoscopy,  which reviewed proximal small bowel AVMs, no overt bleeding. And I can he received another blood transfusion on 01/05/2016. He denies any overt bleeding, no melena or hematochezia. He has moderate fatigue, stable dyspnea, no other new complaints. He received IV Feraheme one month ago.  MEDICAL HISTORY:  Past Medical History  Diagnosis Date  . Ischemic cardiomyopathy      CABG 2003, PCI 2007  EF 27%(myoview 2012  . Chronic systolic heart failure   . Hyperlipidemia   . Hypothyroidism   . Chronic anticoagulation     Afib and LVAD in 2014   . Obesity   . COPD (chronic obstructive pulmonary disease)   . Asbestosis(501)     "6 years in the [place]" (05/24/2013)  . Atrial fibrillation     permanent  . Paroxysmal ventricular tachycardia   . Coronary artery disease   . Implantable cardioverter-defibrillator Med[employer]onic   . Hypertension   . Myocardial infarction 1990's-2000    "2 in  ~ the 1990's; 1 in ~ 2000" (05/24/2013)  . OSA on CPAP   . Depression   . LVAD (left ventricular assist device) present 12/2012    SURGICAL HISTORY: Past Surgical History  Procedure Laterality Date  . Coronary artery bypass graft  2003    "?X4" (05/24/2013)  . Cardiac defibrillator placement  2004; ~ 2010    "cut it out in 2016 after it got infected"   . Insertion of implantable left ventricular assist device N/A 01/12/2013    Procedure: INSERTION OF IMPLANTABLE LEFT VENTRICULAR ASSIST DEVICE;  Surgeon: Ivin Poot, MD;  Location: [place];  Service: Open Heart Surgery;  Laterality: N/A;   nitric oxide; Redo sternotomy  . Intraoperative transesophageal echocardiogram N/A 01/12/2013    Procedure: INTRAOPERATIVE TRANSESOPHAGEAL ECHOCARDIOGRAM;  Surgeon: Ivin Poot, MD;  Location: [place];  Service: Open Heart Surgery;  Laterality: N/A;  . Colonoscopy N/A 03/09/2014    Procedure: COLONOSCOPY;  Surgeon: Inda Castle, MD;  Location: [place];  Service: Endoscopy;  Laterality: N/A;  LVAD  patient  .  Esophagogastroduodenoscopy N/A 03/09/2014    Procedure: ESOPHAGOGASTRODUODENOSCOPY (EGD);  Surgeon: Inda Castle, MD;  Location: [place];  Service: Endoscopy;  Laterality: N/A;  . Givens capsule study N/A 03/10/2014    Procedure: GIVENS CAPSULE STUDY;  Surgeon: Inda Castle, MD;  Location: [place];  Service: Endoscopy;  Laterality: N/A;  . Enteroscopy N/A 07/27/2014    Procedure: ENTEROSCOPY;  Surgeon: Gatha Mayer, MD;  Location: [place];  Service: Endoscopy;  Laterality: N/A;  LVAD patient   . Colonoscopy N/A 07/29/2014    Procedure: COLONOSCOPY;  Surgeon: Gatha Mayer, MD;  Location: [place];  Service: Endoscopy;  Laterality: N/A;  . Givens capsule study N/A 07/30/2014    Procedure: GIVENS CAPSULE STUDY;  Surgeon: Gatha Mayer, MD;  Location: [place];  Service: Endoscopy;  Laterality: N/A;  . Esophagogastroduodenoscopy N/A 08/01/2014    Procedure: ESOPHAGOGASTRODUODENOSCOPY (EGD);  Surgeon: Jerene Bears, MD;  Location: [facility] ENDOSCOPY;  Service: Endoscopy;  Laterality: N/A;  LVAD patient  . Left and right heart catheterization with coronary/graft angiogram  01/07/2013    Procedure: LEFT AND RIGHT HEART CATHETERIZATION WITH Beatrix Fetters;  Surgeon: Jolaine Artist, MD;  Location: [facility] CATH LAB;  Service: Cardiovascular;;  . Enteroscopy N/A 09/27/2014    Procedure: ENTEROSCOPY;  Surgeon: Gatha Mayer, MD;  Location: [facility] ENDOSCOPY;  Service: Endoscopy;  Laterality: N/A;  . Icd lead removal Left 03/16/2015    Procedure: ICD LEAD REMOVAL/EXTRACTION;  Surgeon: Evans Lance, MD;  Location: [place];  Service: Cardiovascular;  Laterality: Left;  PATIENT HAS LVAD  DR. VAN TRIGT TO BACK UP EXTRACTION  . Appendectomy    . Enteroscopy N/A 11/09/2015    Procedure: ENTEROSCOPY;  Surgeon: Mauri Pole, MD;  Location: [facility] ENDOSCOPY;  Service: Endoscopy;  Laterality: N/A;  LVAD  . Givens capsule study N/A 12/11/2015    Procedure: GIVENS CAPSULE STUDY;  Surgeon:  Gatha Mayer, MD;  Location: [place];  Service: Endoscopy;  Laterality: N/A;  . Enteroscopy N/A 12/13/2015    Procedure: ENTEROSCOPY;  Surgeon: Milus Banister, MD;  Location: [place];  Service: Endoscopy;  Laterality: N/A;    SOCIAL HISTORY: Social History   Social History  . Marital Status: Married    Spouse Name: N/A  . Number of Children: N/A  . Years of Education: N/A   Occupational History  . Not on file.   Social History Main Topics  . Smoking status: Former Smoker -- 2.00 packs/day for 45 years    Types: Cigarettes    Quit date: 11/11/2001  . Smokeless tobacco: Never Used  . Alcohol Use: Yes     Comment: 05/24/2013 "use to drink beer; hardly nothing since 2003; nothing at all since 12/2011 when I got my LVAD  "  . Drug Use: No  . Sexual Activity: No   Other Topics Concern  . Not on file   Social History Narrative    FAMILY HISTORY: Family History  Problem Relation Age  of Onset  . Heart attack Mother   . Heart attack Father     ALLERGIES:  has No Known Allergies.  MEDICATIONS:  Current Outpatient Prescriptions  Medication Sig Dispense Refill  . amiodarone (PACERONE) 200 MG tablet Take 400 mg (2 tablets) twice daily until 12/17/15, then decrease back to 400 mg (2 tablets) daily. 90 tablet 3  . apixaban (ELIQUIS) 5 MG TABS tablet Take 1 tablet (5 mg total) by mouth 2 (two) times daily. 60 tablet 11  . budesonide-formoterol (SYMBICORT) 160-4.5 MCG/ACT inhaler Inhale 2 puffs into the lungs 2 (two) times daily. (Patient taking differently: Inhale 2 puffs into the lungs daily. ) 1 Inhaler 3  . cholecalciferol (VITAMIN D) 1000 UNITS tablet Take 1 tablet (1,000 Units total) by mouth 2 (two) times daily. 180 tablet 3  . citalopram (CELEXA) 40 MG tablet Take 1 tablet (40 mg total) by mouth at bedtime. 90 tablet 3  . docusate sodium (COLACE) 100 MG capsule Take 200 mg by mouth 2 (two) times daily.    . furosemide (LASIX) 40 MG tablet Take 40 mg by mouth as  needed for fluid. Reported on 12/21/2015    . hydrocortisone (ANUSOL-HC) 2.5 % rectal cream Place 1 application rectally 2 (two) times daily. 30 g 6  . ipratropium-albuterol (DUONEB) 0.5-2.5 (3) MG/3ML SOLN Take 3 mLs by nebulization every 6 (six) hours as needed (copd).    [name] levothyroxine (SYNTHROID, LEVOTHROID) 100 MCG tablet Take 100 mcg by mouth at bedtime.     . Multiple Vitamins-Minerals (MULTIVITAMIN PO) Take 1 tablet by mouth daily.     . pantoprazole (PROTONIX) 40 MG tablet Take 1 tablet (40 mg total) by mouth 2 (two) times daily. 180 tablet 3  . potassium chloride (K-DUR) 10 MEQ tablet Take 10 mEq by mouth as needed (when taking furosemide). Reported on 12/21/2015    . simvastatin (ZOCOR) 80 MG tablet Take 0.5 tablets (40 mg total) by mouth at bedtime. 45 tablet 3  . tiotropium (SPIRIVA) 18 MCG inhalation capsule Place 1 capsule (18 mcg total) into inhaler and inhale daily. 90 capsule 3  . vitamin B-12 (CYANOCOBALAMIN) 1000 MCG tablet Take 1,000 mcg by mouth at bedtime.    . vitamin C (ASCORBIC ACID) 500 MG tablet Take 1 tablet (500 mg total) by mouth 2 (two) times daily. 180 tablet 3  . nitroGLYCERIN (NITROSTAT) 0.4 MG SL tablet Place 0.4 mg under the tongue every 5 (five) minutes as needed for chest pain. Reported on 01/22/2016    . [START ON 01/25/2016] octreotide (SANDOSTATIN LAR) 20 MG injection Inject 20 mg into the muscle every 30 (thirty) days. (Patient not taking: Reported on 01/22/2016) 1 each 6   No current facility-administered medications for this visit.    REVIEW OF SYSTEMS:   Constitutional: Denies fevers, chills or abnormal night sweats Eyes: Denies blurriness of vision, double vision or watery eyes Ears, nose, mouth, throat, and face: Denies mucositis or sore throat Respiratory: Denies cough, dyspnea or wheezes Cardiovascular: Denies palpitation, chest discomfort or lower extremity swelling Gastrointestinal:  Denies nausea, heartburn or change in bowel habits Skin:  Denies abnormal skin rashes Lymphatics: Denies new lymphadenopathy or easy bruising Neurological:Denies numbness, tingling or new weaknesses Behavioral/Psych: Mood is stable, no new changes  All other systems were reviewed with the patient and are negative.  PHYSICAL EXAMINATION: ECOG PERFORMANCE STATUS: 1 - Symptomatic but completely ambulatory  Filed Vitals:   01/22/16 1008  BP: 90/63  Pulse: 46  Temp: 97.9 F (36.6 C)  Resp: 18   Filed Weights   01/22/16 1008  Weight: 224 lb 4.8 oz (101.742 kg)    GENERAL:alert, no distress and comfortable SKIN: skin color, texture, turgor are normal, no rashes or significant lesions EYES: normal, conjunctiva are pink and non-injected, sclera clear OROPHARYNX:no exudate, no erythema and lips, buccal mucosa, and tongue normal  NECK: supple, thyroid normal size, non-tender, without nodularity LYMPH:  no palpable lymphadenopathy in the cervical, axillary or inguinal LUNGS: clear to auscultation and percussion with normal breathing effort HEART: (+) LVAD blood flow   ABDOMEN:abdomen soft, non-tender and normal bowel sounds Musculoskeletal:no cyanosis of digits and no clubbing  PSYCH: alert & oriented x 3 with fluent speech NEURO: no focal motor/sensory deficits  LABORATORY DATA:  I have reviewed the data as listed CBC Latest Ref Rng 01/22/2016 01/05/2016 01/04/2016  WBC 4.0 - 10.3 10e3/uL 6.6 - 5.8  Hemoglobin 13.0 - 17.1 g/dL 9.4(L) 8.8(L) 8.5(L)  Hematocrit 38.4 - 49.9 % 31.2(L) 29.9(L) 30.3(L)  Platelets 140 - 400 10e3/uL 179 - 204    CMP Latest Ref Rng 01/01/2016 12/21/2015 12/14/2015  Glucose 65 - 99 mg/dL 105(H) 108(H) 103(H)  BUN 6 - 20 mg/dL 16 18 20   Creatinine 0.61 - 1.24 mg/dL 1.01 1.21 1.02  Sodium 135 - 145 mmol/L 139 139 139  Potassium 3.5 - 5.1 mmol/L 4.4 4.2 3.9  Chloride 101 - 111 mmol/L 107 109 107  CO2 22 - 32 mmol/L 23 24 24   Calcium 8.9 - 10.3 mg/dL 9.6 9.6 9.5  Total Protein 6.5 - 8.1 g/dL - - -  Total Bilirubin 0.3 -  1.2 mg/dL - - -  Alkaline Phos 38 - 126 U/L - - -  AST 15 - 41 U/L - - -  ALT 17 - 63 U/L - - -   Results for [name], [name] (MRN [id number]) as of 01/22/2016 06:42  Ref. Range 10/11/2015 09:15 11/07/2015 10:44 11/24/2015 08:16 12/05/2015 09:17 01/01/2016 10:44  Iron Latest Ref Range: 42-163 ug/dL 36 (L) 25 (L) 53    UIBC Latest Ref Range: 117-376 ug/dL 271 321 220    TIBC Latest Ref Range: 202-409 ug/dL 307 346 274    %SAT Latest Ref Range: 20-55 %   20    Saturation Ratios Latest Ref Range: 17.9-39.5 % 12 (L) 7 (L)     Ferritin Latest Ref Range: 24-336 ng/mL 49 31 308 75 32     RADIOGRAPHIC STUDIES: I have personally reviewed the radiological images as listed and agreed with the findings in the report. Dg Chest 2 View  01/01/2016  CLINICAL DATA:  Chest pain. EXAM: CHEST  2 VIEW COMPARISON:  12/08/2015.  08/22/2015 .  12/20/2014 . FINDINGS: Prior CABG. Left ventricular assist device noted in stable position. Heart size stable. Stable mild bilateral pulmonary interstitial prominence. No interim change from multiple prior exams. Pleural calcification. No pleural effusion pneumothorax. No acute bony abnormality. IMPRESSION: 1. Prior CABG. Left ventricular assist device in stable position. Stable cardiomegaly. 2. Chronic interstitial lung disease. Electronically Signed   By: Marcello Moores  Register   On: 01/01/2016 14:08    ASSESSMENT & PLAN:  75 year old Caucasian male, with extensive cardiac history, including CAD, ischemic cardiomyopathy status post LVAD, on long-term Coumadin, presented with multiple episodes of GI bleeding and anemia.  1. 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 -His iron studies showed low ferritin level and  serum iron, consistent with iron deficient anemia. This is secondary to his GI bleeding. -He responded to IV iron very well. 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  -His Coumadin has been changed to Eliquis now  -Close monitoring his CBC and ferritin every 2 weeks and iron level every months in our cancer center  -Continue Feraheme if ferritin less than 100, I'll set up infusion appointment every 4 weeks (2 treatments every week)  -He will also receive red blood cell if his hemoglobin less than 9 in our Center -He will also follow up with  GI   2. Mild thrombocytopenia -Secondary to LVAD -Plt count has been stable   3. He will continue to follow-up with his primary care physician and cardiologist for his HTN, heart disease, hypothyroidism, OSA and mild liver cirrhosis..  Plan -Repeat CBC and ferritin every 2 weeks and iron study monthly  Here -IV Feraheme  510mg  X1 today and next week, I will set up feraheme infusion in 4 and 5 weeks, and proceed if ferritin less than 100 in 4 weeks. -2u RBC if Hb<9.0  -I will discuss with Dr. Haroldine Laws about his RBC transfusion here  -Return to clinic in 2 months  All questions were answered. The patient knows to call the clinic with any problems, questions or concerns.  I spent 20 minutes counseling the patient face to face. The total time spent in the appointment was 25 minutes and more than 50% was on counseling.     Truitt Merle, MD 01/22/2016

## 2016-01-23 ENCOUNTER — Telehealth: Payer: Self-pay | Admitting: Hematology

## 2016-01-23 ENCOUNTER — Ambulatory Visit (HOSPITAL_COMMUNITY)
Admission: RE | Admit: 2016-01-23 | Discharge: 2016-01-23 | Disposition: A | Discharge status: Home / Self Care | Payer: No Typology Code available for payment source | Source: Ambulatory Visit | Attending: Internal Medicine | Admitting: Internal Medicine

## 2016-01-23 ENCOUNTER — Telehealth: Payer: Self-pay | Admitting: *Deleted

## 2016-01-23 VITALS — BP 130/0 | HR 60 | Wt 225.6 lb

## 2016-01-23 DIAGNOSIS — Z7901 Long term (current) use of anticoagulants: Secondary | ICD-10-CM | POA: Diagnosis not present

## 2016-01-23 DIAGNOSIS — E785 Hyperlipidemia, unspecified: Secondary | ICD-10-CM | POA: Insufficient documentation

## 2016-01-23 DIAGNOSIS — I10 Essential (primary) hypertension: Secondary | ICD-10-CM

## 2016-01-23 DIAGNOSIS — K922 Gastrointestinal hemorrhage, unspecified: Secondary | ICD-10-CM

## 2016-01-23 DIAGNOSIS — I2510 Atherosclerotic heart disease of native coronary artery without angina pectoris: Secondary | ICD-10-CM | POA: Insufficient documentation

## 2016-01-23 DIAGNOSIS — Z87891 Personal history of nicotine dependence: Secondary | ICD-10-CM | POA: Diagnosis not present

## 2016-01-23 DIAGNOSIS — I480 Paroxysmal atrial fibrillation: Secondary | ICD-10-CM | POA: Diagnosis not present

## 2016-01-23 DIAGNOSIS — Z79899 Other long term (current) drug therapy: Secondary | ICD-10-CM | POA: Insufficient documentation

## 2016-01-23 DIAGNOSIS — F329 Major depressive disorder, single episode, unspecified: Secondary | ICD-10-CM | POA: Insufficient documentation

## 2016-01-23 DIAGNOSIS — G4733 Obstructive sleep apnea (adult) (pediatric): Secondary | ICD-10-CM | POA: Insufficient documentation

## 2016-01-23 DIAGNOSIS — Z951 Presence of aortocoronary bypass graft: Secondary | ICD-10-CM | POA: Insufficient documentation

## 2016-01-23 DIAGNOSIS — E669 Obesity, unspecified: Secondary | ICD-10-CM | POA: Diagnosis not present

## 2016-01-23 DIAGNOSIS — I252 Old myocardial infarction: Secondary | ICD-10-CM | POA: Insufficient documentation

## 2016-01-23 DIAGNOSIS — E039 Hypothyroidism, unspecified: Secondary | ICD-10-CM | POA: Insufficient documentation

## 2016-01-23 DIAGNOSIS — I5023 Acute on chronic systolic (congestive) heart failure: Secondary | ICD-10-CM

## 2016-01-23 DIAGNOSIS — I110 Hypertensive heart disease with heart failure: Secondary | ICD-10-CM | POA: Diagnosis not present

## 2016-01-23 DIAGNOSIS — R0902 Hypoxemia: Secondary | ICD-10-CM | POA: Insufficient documentation

## 2016-01-23 DIAGNOSIS — Z95811 Presence of heart assist device: Secondary | ICD-10-CM | POA: Insufficient documentation

## 2016-01-23 DIAGNOSIS — I255 Ischemic cardiomyopathy: Secondary | ICD-10-CM | POA: Insufficient documentation

## 2016-01-23 DIAGNOSIS — D649 Anemia, unspecified: Secondary | ICD-10-CM | POA: Insufficient documentation

## 2016-01-23 DIAGNOSIS — J449 Chronic obstructive pulmonary disease, unspecified: Secondary | ICD-10-CM | POA: Insufficient documentation

## 2016-01-23 DIAGNOSIS — I5022 Chronic systolic (congestive) heart failure: Secondary | ICD-10-CM | POA: Insufficient documentation

## 2016-01-23 DIAGNOSIS — I4891 Unspecified atrial fibrillation: Secondary | ICD-10-CM

## 2016-01-23 LAB — BASIC METABOLIC PANEL
ANION GAP: 7 (ref 5–15)
BUN: 13 mg/dL (ref 6–20)
CO2: 25 mmol/L (ref 22–32)
Calcium: 9.7 mg/dL (ref 8.9–10.3)
Chloride: 109 mmol/L (ref 101–111)
Creatinine, Ser: 1.01 mg/dL (ref 0.61–1.24)
Glucose, Bld: 135 mg/dL — ABNORMAL HIGH (ref 65–99)
POTASSIUM: 3.9 mmol/L (ref 3.5–5.1)
SODIUM: 141 mmol/L (ref 135–145)

## 2016-01-23 LAB — LACTATE DEHYDROGENASE: LDH: 244 U/L — ABNORMAL HIGH (ref 98–192)

## 2016-01-23 MED ORDER — LOSARTAN POTASSIUM 25 MG PO TABS: 25.0000 mg | ORAL_TABLET | Freq: Every day | ORAL | Status: DC

## 2016-01-23 NOTE — Progress Notes (Signed)
Symptom  Yes  No  Details   Angina         x Activity:   Claudication                x How far:  Left leg "giving out"  Syncope         x When: dizzy with orthostatic changes  Stroke         x   Orthopnea         x How many pillows:   2 for comfort  PND         x How often:  CPAP         x  How many hrs:  All night; no O2  Pedal edema         x   Abd fullness         x   N&V         x Appetite decreased, but can eat a full meal   Diaphoresis                x When:   Bleeding               x Dark tarry stool - Tuesday  Urine color    medium yellow  SOB        x  Activity:  Incline; one block level ground; fatigue; legs "feel like rubber"  Palpitations         x When:  ICD shock         N/A   Hospitlizaitons        x        When/where/why:  3/6 - 12/14/15  ED visit               x When/where/why:   Other MD              x When/who/why:    Activity    No formal activity; sedentary  Fluid    No limitations  Diet    No limitations   Vital signs: HR:  60 Doppler modified systolic: [id number] Auto cuff:  120/75 (94) O2 Sat: 96% Wt:  225.6  lbs Last weight:  223.6  lbs Ht: 6'1"  LVAD interrogation reveals (reviewed with Dr Aundra Dubin):  Speed: 9200 Flow:  4.9 Power: 5.3 PI:  6.2 Alarms: none Events: rare PI event       Fixed speed:  9200 Low speed limit: 8600 Primary Controller:  Replace back up battery in 29 months  Back up controller:   Replace back up battery in 29 months   LVAD exit site:  Well healed and incorporated. The velour is fully implanted at exit site. Dressing dry and intact. No erythema or drainage. Stabilization device present and accurately applied. Driveline dressing is being changed weekly per sterile technique using Sorbaview dressing without biopatch (causes skin irritation) exit site. Pt denies fever or chills. Dressing supplies provided to patient.   Encounter Details: Presents to clinic today with his wife for one month f/u. Last transfusion was 01/05/16 with Hgb  afterwards of 8.8.  Hgb yesterday 9.4. Pt denies dark black stools. Feeling better overall, enjoyed vacation at [facility]. Saw Dr. Margit Banda yesterday with Ferritin 18, iron 19. Wife states Dr. Margit Banda plans on q 2 week labs with IV iron as needed and 2 units PC's for Hgb < 9.0 if Heart Failure providers approve.  Dr. Margit Banda can also give IV octreotide if needed and  [facility] approved.  Arbutus Leas, PharmD in to speak with pt/wife; she will attempt to contact [facility] for update on Octreotide approval.   Hgb --> 9.4 (last transfusion 4/7/017 with Hgb 8.8)  INR --> stopped coumadin in hospital; pt on Apixaban 5 mg bid (started 12/14/15)  LDH -->  244  Stable with established baseline.   MAP--> 120/75 (94) correlating with doppler modified systolic [id number]. Rechecked with 108/77 (91).  Will restart Losartan at lower dose of 25 mg daily per Dr. Haroldine Laws.   Patient Instructions: 1.  Start Losartan 25 mg daily. 2.  Will call you if [facility] approves Octreotide. 3.  Return to [place] clinic in one month.  We will correlate our labs per Dr. Margit Banda.   Zada Girt, RN VAD Coordinator  Office: [phone] 24/7 VAD Pager: [phone]

## 2016-01-23 NOTE — Telephone Encounter (Signed)
Per staff message and POF I have scheduled appts. Advised scheduler of appts. JMW  

## 2016-01-23 NOTE — Telephone Encounter (Signed)
per pof to sch pt appt-gave pt copy opf avs-sent MW email to sch trmt per-willc all pt after reply

## 2016-01-23 NOTE — Patient Instructions (Signed)
1.  Start Losartan 25 mg daily. 2.  Will call you if [facility] approves Octreotide. 3.  Return to [place] clinic in one month.  We will correlate our labs per Dr. Margit Banda.

## 2016-01-25 ENCOUNTER — Telehealth: Payer: Self-pay | Admitting: *Deleted

## 2016-01-25 NOTE — Telephone Encounter (Signed)
"  Someone from the office just called.  No message was left."  No documentation of a call.  Advised of upcoming appointment scheduled.  No further questions.

## 2016-01-26 ENCOUNTER — Ambulatory Visit: Payer: Non-veteran care | Admitting: Gastroenterology

## 2016-01-28 NOTE — Progress Notes (Signed)
Patient ID: [name], male   DOB: 01-Jan-1941, 75 y.o.   MRN: [id number]   VAD CLINIC NOTE   [facility] [place] (Dr. 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 Quit smoking 2003. Uses CPAP and O2 every night. He is s/p LVAD HM II implanted 01/12/13 under DT criteria  Admitted to [facility] 09/13/13 - 09/14/13 for increased fatigue and dyspnea. Found to have mild CHF and anemia. Diuresed with IV lasix and transitioned to PO lasix 40 mg twice a week. Discharge weight 203 lbs on inpatient scale.  Admitting labs revealed Hgb 7.7 for which he received 2 units PCs with appropriate rise in Hgb.   Admit 6/8-6/12 for symptomatic anemia. Had EGD/Colonoscopy which was unrevealing.  6 mm polyp clipped and removed. CT abdomen showed cirrhosis. Placed on heparin bridge. Capsule study pending. Sent home on lovenox. D/C weight 206 lbs.   Admitted to [facility] with with GI bleed. GI consulted. Push enteroscopy 07/27/13 negative. Colonoscopy 10/29 without source of bleeding.Result of capsule endoscopy--> Showed bleeding from duodenum. EGD--->Bleeding from duodenum with clip applied. Overall he received  10 UPRBCs. Discharge weight was 207 pounds.   Admitted to [facility] 09/21/14 with GI bleed . GI consulted. Enteroscopy showed bleed from jejunum requiring 3 clips. Overall he received 4 units PRBCs.  He went back in A fib and had DC-CV.  On the day discharge he was back in NSR on amiodarone 200 mg twice a day. Discharge weight was 214 pounds.   Admitted in 6/16 for ICD extraction due to pocket infection. Did well. No complications.   Admitted 2/17 with BRPBR.  Ultimately, thought to be due to epistaxis + hemorrhoidal bleeding but Hgb continued to trend down so underwent GI eval.  Enteroscopy was normal.     Admitted  12/08/2015 with symptomatic anemia. GI consulted and performed  Capsule 12/11/15 2 proximal small bowel AVMs, 2 nonbleeding polyps which were removed. Received 3UPRBCs. He had recurrent A fib but chemically converted with amio drip. Later transitioned to amio 400 mg bid. Losartan was held due to low MAP and dizziness. Coumadin switched to apixaban.   LVAD Clinic f/u:  Feeling better overall. Received 2 more units RBCs in early April before his trip. Hgb stable on yesterday's labs. Still with occasional dark stools. Feels weak at times but overall better. No orthopnea, PND or edema. No neuro sx.   Denies LVAD alarms. Denies driveline trauma, erythema or drainage.  Denies ICD shocks. Reports taking Coumadin as prescribed and adherence to anticoagulation based dietary restrictions. Denies bright red blood per rectum or melena, no dark urine or hematuria.    LVAD interrogation reveals (reviewed with Dr Aundra Dubin):  Speed: 9200 Flow: 4.9 Power: 5.3 PI: 6.2 Alarms: none Events: rare PI event  Fixed speed: 9200 Low speed limit: 8600 Primary Controller: Replace back up battery in 29 months  Back up controller: Replace back up battery in 29 months   Labs (2/16): LDL 305, HCT 38.9 Labs (3/16): K 3.9, creatinine 1.05, LFTs normal Labs (2/17): WBCs 11.2, INR 2.68.   Past Medical History  Diagnosis Date  . Ischemic cardiomyopathy      CABG 2003, PCI 2007  EF 27%(myoview 2012  . Chronic systolic heart failure ([place])   . Hyperlipidemia   . Hypothyroidism   . Chronic anticoagulation  Afib and LVAD  . Obesity   . COPD (chronic obstructive pulmonary disease) ([place])   . Asbestosis(501)     "6 years in the [place]" (05/24/2013)  . Atrial fibrillation ([place])     permanent  . Paroxysmal ventricular tachycardia ([place])   . Coronary artery disease   . Hypertension   . Depression   . LVAD (left ventricular assist device) present ([place]) 12/2012  . Epistaxis 11/2015, 07/2014  . Cancer ([place])      "scraped some off behind my left ear; fast moving; having it cut out 12/05/2015" (11/07/2015)  . CHF (congestive heart failure) ([place])   . Myocardial infarction ([place]) 1990's-2000    "2 in  ~ the 1990's; 1 in ~ 2000" (11/07/2015)  . On home oxygen therapy     "have it available; don't use it" (11/07/2015)  . OSA on CPAP   . Anemia   . History of blood transfusion 08/2015 X 10; 11/2015 X 2    "related to bleeding on the inside somewhere" (11/07/2015)  . Complication of anesthesia     "they can't put me all the way to sleep cause of my heart" (11/07/2015)    Current Outpatient Prescriptions  Medication Sig Dispense Refill  . amiodarone (PACERONE) 200 MG tablet Take 400 mg (2 tablets) twice daily until 12/17/15, then decrease back to 400 mg (2 tablets) daily. 90 tablet 3  . apixaban (ELIQUIS) 5 MG TABS tablet Take 1 tablet (5 mg total) by mouth 2 (two) times daily. 60 tablet 11  . budesonide-formoterol (SYMBICORT) 160-4.5 MCG/ACT inhaler Inhale 2 puffs into the lungs 2 (two) times daily. (Patient taking differently: Inhale 2 puffs into the lungs daily. ) 1 Inhaler 3  . cholecalciferol (VITAMIN D) 1000 UNITS tablet Take 1 tablet (1,000 Units total) by mouth 2 (two) times daily. 180 tablet 3  . citalopram (CELEXA) 40 MG tablet Take 1 tablet (40 mg total) by mouth at bedtime. 90 tablet 3  . docusate sodium (COLACE) 100 MG capsule Take 200 mg by mouth 2 (two) times daily.    . furosemide (LASIX) 40 MG tablet Take 40 mg by mouth as needed for fluid. Reported on 12/21/2015    . hydrocortisone (ANUSOL-HC) 2.5 % rectal cream Place 1 application rectally 2 (two) times daily. 30 g 6  . ipratropium-albuterol (DUONEB) 0.5-2.5 (3) MG/3ML SOLN Take 3 mLs by nebulization every 6 (six) hours as needed (copd).    [name] levothyroxine (SYNTHROID, LEVOTHROID) 100 MCG tablet Take 100 mcg by mouth at bedtime.     . Multiple Vitamins-Minerals (MULTIVITAMIN PO) Take 1 tablet by mouth daily.     . pantoprazole (PROTONIX) 40 MG tablet  Take 1 tablet (40 mg total) by mouth 2 (two) times daily. 180 tablet 3  . potassium chloride (K-DUR) 10 MEQ tablet Take 10 mEq by mouth as needed (when taking furosemide). Reported on 12/21/2015    . simvastatin (ZOCOR) 80 MG tablet Take 0.5 tablets (40 mg total) by mouth at bedtime. 45 tablet 3  . tiotropium (SPIRIVA) 18 MCG inhalation capsule Place 1 capsule (18 mcg total) into inhaler and inhale daily. 90 capsule 3  . vitamin B-12 (CYANOCOBALAMIN) 1000 MCG tablet Take 1,000 mcg by mouth at bedtime.    . vitamin C (ASCORBIC ACID) 500 MG tablet Take 1 tablet (500 mg total) by mouth 2 (two) times daily. 180 tablet 3  . losartan (COZAAR) 25 MG tablet Take 1 tablet (25 mg total) by mouth daily. 30 tablet 6  .  nitroGLYCERIN (NITROSTAT) 0.4 MG SL tablet Place 0.4 mg under the tongue every 5 (five) minutes as needed for chest pain. Reported on 01/23/2016     No current facility-administered medications for this encounter.    Review of patient's allergies indicates no known allergies.  REVIEW OF SYSTEMS: All systems negative except as listed in HPI, PMH and Problem list.   Filed Vitals:   01/23/16 1117 01/23/16 1118  BP: 120/75 130/0  Pulse: 60   Weight: 225 lb 9.6 oz (102.331 kg)   SpO2: 96%    Vital signs: HR: 60 Doppler modified systolic: [id number] Auto cuff: 0000000 (94) O2 Sat: 96% Wt: 225.6 lbs Last weight: 223.6 lbs Ht: 6'1"   GENERAL:Well appearing, male; NAD; wife present. Ambulated in the clinic without difficulty.  HEENT: normal  NECK: Supple, JVP 7 no bruits.  No lymphadenopathy or thyromegaly appreciated.   CARDIAC: Mechanical heart sounds with LVAD hum present.  LUNGS:  Clear to auscultation bilaterally.  ABDOMEN:  Obese, Soft, round, nontender, positive bowel sounds x4.     LVAD exit site: well-healed and incorporated.  Dressing dry and intact.  No erythema, drainage, odor or tenderness.  Stabilization device present and accurately applied.  Driveline dressing is being  changed daily per sterile technique. Changed in clinic EXTREMITIES:  Warm and dry, no cyanosis, clubbing, no edema NEUROLOGIC:  Alert and oriented x 4.  Gait steady.  No aphasia.  No dysarthria.  Affect pleasant.      ASSESSMENT AND PLAN:  1. Chronic Systolic HF: s/p LVAD implant 12/2012 for DT.  NYHA II-III. He is not volume overloaded.  Not on BB due to previous bradycardia.  - MAP back up. Restart losartan ar reduced dose 25mg  daily.     -Continue diuretics as needed.  - Reinforced the need and importance of daily weights, a low sodium diet, and fluid restriction (less than 2 L a day). Instructed to call the HF clinic if weight increases more than 3 lbs overnight or 5 lbs in a week.  2.  Anticoagulation management: INR goal 1.8-2.3 with history of GI bleeding.  He is not on ASA because of bleeding.  3. Anemia/ GIB:  Now on apixaban due to recurrent GI bleeding with normal endoscopy --Hgb yesterday is stable. Receiving iron infusions with Dr. Burr Medico.  --Octreotide pending approval from [place]. If rebleeds on apixaban will  Need.   4. LVAD: MAP elevated.  - MAP back up. Restart losartan ar reduced dose 25mg  daily.       - Check CBC, BMET, LDH and INR today.   5. PAF: Paroxysmal.  He is in NSR today, feels occasional atrial fibrillation. Continue amiodarone 400 mg daily for now. Follow LFTs and TSH.  Should have yearly eye exam.    6. Depression: Stable.  Continue Celexa 40 mg daily 7. OSA: Using CPAP.  8. Hypoxemia:  Uses home oxygen at night.  This may be due to his baseline COPD. 9. ICD infection - s/p extraction 6/16. No complications.  10. HTN - - MAP back up. Restart losartan ar reduced dose 25mg  daily.      Glori Bickers MD 01/28/2016

## 2016-01-29 ENCOUNTER — Ambulatory Visit (HOSPITAL_BASED_OUTPATIENT_CLINIC_OR_DEPARTMENT_OTHER): Payer: No Typology Code available for payment source

## 2016-01-29 VITALS — BP 110/77 | HR 50 | Temp 97.8°F | Resp 16

## 2016-01-29 DIAGNOSIS — K922 Gastrointestinal hemorrhage, unspecified: Secondary | ICD-10-CM

## 2016-01-29 DIAGNOSIS — D500 Iron deficiency anemia secondary to blood loss (chronic): Secondary | ICD-10-CM | POA: Diagnosis not present

## 2016-01-29 DIAGNOSIS — D62 Acute posthemorrhagic anemia: Secondary | ICD-10-CM

## 2016-01-29 MED ORDER — SODIUM CHLORIDE 0.9 % IV SOLN
510.0000 mg | Freq: Once | INTRAVENOUS | Status: AC
  Administered 2016-01-29: 510 mg via INTRAVENOUS
  Filled 2016-01-29: qty 17

## 2016-01-29 NOTE — Patient Instructions (Signed)

## 2016-02-05 ENCOUNTER — Telehealth: Payer: Self-pay | Admitting: *Deleted

## 2016-02-05 ENCOUNTER — Other Ambulatory Visit (HOSPITAL_BASED_OUTPATIENT_CLINIC_OR_DEPARTMENT_OTHER): Payer: No Typology Code available for payment source

## 2016-02-05 DIAGNOSIS — D649 Anemia, unspecified: Secondary | ICD-10-CM

## 2016-02-05 DIAGNOSIS — K922 Gastrointestinal hemorrhage, unspecified: Secondary | ICD-10-CM

## 2016-02-05 DIAGNOSIS — D500 Iron deficiency anemia secondary to blood loss (chronic): Secondary | ICD-10-CM

## 2016-02-05 LAB — CBC & DIFF AND RETIC
BASO%: 0.4 % (ref 0.0–2.0)
Basophils Absolute: 0 10*3/uL (ref 0.0–0.1)
EOS ABS: 0.1 10*3/uL (ref 0.0–0.5)
EOS%: 1.3 % (ref 0.0–7.0)
HEMATOCRIT: 35.6 % — AB (ref 38.4–49.9)
HGB: 10.4 g/dL — ABNORMAL LOW (ref 13.0–17.1)
IMMATURE RETIC FRACT: 22.8 % — AB (ref 3.00–10.60)
LYMPH%: 6.5 % — AB (ref 14.0–49.0)
MCH: 26.5 pg — ABNORMAL LOW (ref 27.2–33.4)
MCHC: 29.3 g/dL — ABNORMAL LOW (ref 32.0–36.0)
MCV: 90.5 fL (ref 79.3–98.0)
MONO#: 0.3 10*3/uL (ref 0.1–0.9)
MONO%: 5.9 % (ref 0.0–14.0)
NEUT%: 85.9 % — AB (ref 39.0–75.0)
NEUTROS ABS: 4.9 10*3/uL (ref 1.5–6.5)
PLATELETS: 129 10*3/uL — AB (ref 140–400)
RBC: 3.94 10*6/uL — AB (ref 4.20–5.82)
RDW: 25.9 % — ABNORMAL HIGH (ref 11.0–14.6)
RETIC CT ABS: 265.56 10*3/uL — AB (ref 34.80–93.90)
Retic %: 6.74 % — ABNORMAL HIGH (ref 0.80–1.80)
WBC: 5.7 10*3/uL (ref 4.0–10.3)
lymph#: 0.4 10*3/uL — ABNORMAL LOW (ref 0.9–3.3)
nRBC: 0 % (ref 0–0)

## 2016-02-05 LAB — TECHNOLOGIST REVIEW

## 2016-02-05 LAB — FERRITIN: Ferritin: 295 ng/ml (ref 22–316)

## 2016-02-05 NOTE — Telephone Encounter (Signed)
Received message from wife wanting to know results of Iron lab done today.  Dr. Burr Medico reviewed results.  Spoke with wife [name] and informed her re: Per Dr. Burr Medico,  Ferritin and Hgb were much improved.  Informed [name] that nurse  will contact pt with further instructions after lab draw on 02/19/16 to see whether pt will need Feraheme infusion.   [name] voiced understanding

## 2016-02-16 ENCOUNTER — Telehealth (HOSPITAL_COMMUNITY): Payer: Self-pay | Admitting: *Deleted

## 2016-02-16 ENCOUNTER — Emergency Department (HOSPITAL_COMMUNITY): Payer: Non-veteran care

## 2016-02-16 ENCOUNTER — Encounter (HOSPITAL_COMMUNITY): Payer: Self-pay | Admitting: *Deleted

## 2016-02-16 ENCOUNTER — Inpatient Hospital Stay (HOSPITAL_COMMUNITY)
Admission: EM | Admit: 2016-02-16 | Discharge: 2016-02-23 | DRG: 345 | Disposition: A | Discharge status: Home / Self Care | Payer: Non-veteran care | Attending: Internal Medicine | Admitting: Internal Medicine

## 2016-02-16 DIAGNOSIS — Z9981 Dependence on supplemental oxygen: Secondary | ICD-10-CM | POA: Diagnosis not present

## 2016-02-16 DIAGNOSIS — D500 Iron deficiency anemia secondary to blood loss (chronic): Secondary | ICD-10-CM | POA: Diagnosis present

## 2016-02-16 DIAGNOSIS — I472 Ventricular tachycardia: Secondary | ICD-10-CM | POA: Diagnosis present

## 2016-02-16 DIAGNOSIS — I255 Ischemic cardiomyopathy: Secondary | ICD-10-CM | POA: Diagnosis present

## 2016-02-16 DIAGNOSIS — I5022 Chronic systolic (congestive) heart failure: Secondary | ICD-10-CM | POA: Diagnosis present

## 2016-02-16 DIAGNOSIS — K7460 Unspecified cirrhosis of liver: Secondary | ICD-10-CM | POA: Diagnosis present

## 2016-02-16 DIAGNOSIS — Z95810 Presence of automatic (implantable) cardiac defibrillator: Secondary | ICD-10-CM | POA: Diagnosis not present

## 2016-02-16 DIAGNOSIS — E785 Hyperlipidemia, unspecified: Secondary | ICD-10-CM | POA: Diagnosis present

## 2016-02-16 DIAGNOSIS — Q2733 Arteriovenous malformation of digestive system vessel: Secondary | ICD-10-CM

## 2016-02-16 DIAGNOSIS — D649 Anemia, unspecified: Secondary | ICD-10-CM | POA: Diagnosis not present

## 2016-02-16 DIAGNOSIS — K5521 Angiodysplasia of colon with hemorrhage: Principal | ICD-10-CM | POA: Diagnosis present

## 2016-02-16 DIAGNOSIS — Z79899 Other long term (current) drug therapy: Secondary | ICD-10-CM

## 2016-02-16 DIAGNOSIS — K219 Gastro-esophageal reflux disease without esophagitis: Secondary | ICD-10-CM | POA: Diagnosis present

## 2016-02-16 DIAGNOSIS — K921 Melena: Secondary | ICD-10-CM | POA: Diagnosis present

## 2016-02-16 DIAGNOSIS — I9589 Other hypotension: Secondary | ICD-10-CM | POA: Diagnosis not present

## 2016-02-16 DIAGNOSIS — I480 Paroxysmal atrial fibrillation: Secondary | ICD-10-CM

## 2016-02-16 DIAGNOSIS — Z7901 Long term (current) use of anticoagulants: Secondary | ICD-10-CM

## 2016-02-16 DIAGNOSIS — I110 Hypertensive heart disease with heart failure: Secondary | ICD-10-CM | POA: Diagnosis present

## 2016-02-16 DIAGNOSIS — Z95811 Presence of heart assist device: Secondary | ICD-10-CM

## 2016-02-16 DIAGNOSIS — Z951 Presence of aortocoronary bypass graft: Secondary | ICD-10-CM

## 2016-02-16 DIAGNOSIS — I959 Hypotension, unspecified: Secondary | ICD-10-CM | POA: Diagnosis present

## 2016-02-16 DIAGNOSIS — I252 Old myocardial infarction: Secondary | ICD-10-CM | POA: Diagnosis not present

## 2016-02-16 DIAGNOSIS — I2510 Atherosclerotic heart disease of native coronary artery without angina pectoris: Secondary | ICD-10-CM | POA: Diagnosis present

## 2016-02-16 DIAGNOSIS — J449 Chronic obstructive pulmonary disease, unspecified: Secondary | ICD-10-CM | POA: Diagnosis present

## 2016-02-16 DIAGNOSIS — G4733 Obstructive sleep apnea (adult) (pediatric): Secondary | ICD-10-CM | POA: Diagnosis present

## 2016-02-16 DIAGNOSIS — F329 Major depressive disorder, single episode, unspecified: Secondary | ICD-10-CM | POA: Diagnosis present

## 2016-02-16 DIAGNOSIS — K922 Gastrointestinal hemorrhage, unspecified: Secondary | ICD-10-CM | POA: Diagnosis not present

## 2016-02-16 DIAGNOSIS — E039 Hypothyroidism, unspecified: Secondary | ICD-10-CM | POA: Diagnosis present

## 2016-02-16 DIAGNOSIS — I10 Essential (primary) hypertension: Secondary | ICD-10-CM

## 2016-02-16 DIAGNOSIS — Z87891 Personal history of nicotine dependence: Secondary | ICD-10-CM | POA: Diagnosis not present

## 2016-02-16 DIAGNOSIS — R1011 Right upper quadrant pain: Secondary | ICD-10-CM | POA: Diagnosis not present

## 2016-02-16 DIAGNOSIS — I4891 Unspecified atrial fibrillation: Secondary | ICD-10-CM

## 2016-02-16 DIAGNOSIS — K5520 Angiodysplasia of colon without hemorrhage: Secondary | ICD-10-CM | POA: Diagnosis not present

## 2016-02-16 DIAGNOSIS — R001 Bradycardia, unspecified: Secondary | ICD-10-CM | POA: Diagnosis present

## 2016-02-16 DIAGNOSIS — E860 Dehydration: Secondary | ICD-10-CM | POA: Diagnosis not present

## 2016-02-16 DIAGNOSIS — I5020 Unspecified systolic (congestive) heart failure: Secondary | ICD-10-CM

## 2016-02-16 LAB — LACTATE DEHYDROGENASE
LDH: 249 U/L — AB (ref 98–192)
LDH: 221 U/L — ABNORMAL HIGH (ref 98–192)

## 2016-02-16 LAB — PREPARE RBC (CROSSMATCH)

## 2016-02-16 LAB — URINALYSIS, ROUTINE W REFLEX MICROSCOPIC
BILIRUBIN URINE: NEGATIVE
GLUCOSE, UA: NEGATIVE mg/dL
KETONES UR: 15 mg/dL — AB
Leukocytes, UA: NEGATIVE
Nitrite: NEGATIVE
PROTEIN: NEGATIVE mg/dL
Specific Gravity, Urine: 1.027 (ref 1.005–1.030)
pH: 6 (ref 5.0–8.0)

## 2016-02-16 LAB — BASIC METABOLIC PANEL
ANION GAP: 8 (ref 5–15)
BUN: 26 mg/dL — ABNORMAL HIGH (ref 6–20)
CHLORIDE: 106 mmol/L (ref 101–111)
CO2: 23 mmol/L (ref 22–32)
CREATININE: 1.07 mg/dL (ref 0.61–1.24)
Calcium: 9.7 mg/dL (ref 8.9–10.3)
GFR calc non Af Amer: >60 mL/min (ref >60)
Glucose, Bld: 108 mg/dL — ABNORMAL HIGH (ref 65–99)
Potassium: 4.2 mmol/L (ref 3.5–5.1)
SODIUM: 137 mmol/L (ref 135–145)

## 2016-02-16 LAB — CBC
HCT: 30.4 % — ABNORMAL LOW (ref 39.0–52.0)
HCT: 27.6 % — ABNORMAL LOW (ref 39.0–52.0)
HEMOGLOBIN: 8.1 g/dL — AB (ref 13.0–17.0)
Hemoglobin: 8.6 g/dL — ABNORMAL LOW (ref 13.0–17.0)
MCH: 27 pg (ref 26.0–34.0)
MCH: 28 pg (ref 26.0–34.0)
MCHC: 28.3 g/dL — AB (ref 30.0–36.0)
MCHC: 29.3 g/dL — ABNORMAL LOW (ref 30.0–36.0)
MCV: 95.6 fL (ref 78.0–100.0)
MCV: 95.5 fL (ref 78.0–100.0)
PLATELETS: 169 10*3/uL (ref 150–400)
Platelets: 148 10*3/uL — ABNORMAL LOW (ref 150–400)
RBC: 3.18 MIL/uL — AB (ref 4.22–5.81)
RBC: 2.89 MIL/uL — AB (ref 4.22–5.81)
RDW: 23.7 % — AB (ref 11.5–15.5)
RDW: 23 % — ABNORMAL HIGH (ref 11.5–15.5)
WBC: 7.9 10*3/uL (ref 4.0–10.5)
WBC: 6.2 10*3/uL (ref 4.0–10.5)

## 2016-02-16 LAB — URINE MICROSCOPIC-ADD ON: SQUAMOUS EPITHELIAL / LPF: NONE SEEN

## 2016-02-16 LAB — MRSA PCR SCREENING: MRSA by PCR: NEGATIVE

## 2016-02-16 LAB — I-STAT TROPONIN, ED: Troponin i, poc: 0.01 ng/mL (ref 0.00–0.08)

## 2016-02-16 LAB — PROTIME-INR
INR: 1.44 (ref 0.00–1.49)
Prothrombin Time: 17.7 seconds — ABNORMAL HIGH (ref 11.6–15.2)

## 2016-02-16 LAB — POC OCCULT BLOOD, ED: Fecal Occult Bld: POSITIVE — AB

## 2016-02-16 MED ORDER — FUROSEMIDE 40 MG PO TABS: 40.0000 mg | ORAL_TABLET | ORAL | Status: DC | PRN

## 2016-02-16 MED ORDER — VITAMIN D 1000 UNITS PO TABS
1000.0000 [IU] | ORAL_TABLET | Freq: Two times a day (BID) | ORAL | Status: DC
  Administered 2016-02-16 – 2016-02-23 (×14): 1000 [IU] via ORAL
  Filled 2016-02-16 (×14): qty 1

## 2016-02-16 MED ORDER — AMIODARONE HCL 200 MG PO TABS
400.0000 mg | ORAL_TABLET | Freq: Two times a day (BID) | ORAL | Status: DC
  Administered 2016-02-16 – 2016-02-19 (×6): 400 mg via ORAL
  Filled 2016-02-16 (×6): qty 2

## 2016-02-16 MED ORDER — DOCUSATE SODIUM 100 MG PO CAPS
200.0000 mg | ORAL_CAPSULE | Freq: Two times a day (BID) | ORAL | Status: DC
  Administered 2016-02-16 – 2016-02-23 (×12): 200 mg via ORAL
  Filled 2016-02-16 (×12): qty 2

## 2016-02-16 MED ORDER — SODIUM CHLORIDE 0.9 % IV BOLUS (SEPSIS): 500.0000 mL | Freq: Once | INTRAVENOUS | Status: AC

## 2016-02-16 MED ORDER — SODIUM CHLORIDE 0.9 % IV BOLUS (SEPSIS)
500.0000 mL | Freq: Once | INTRAVENOUS | Status: AC
  Administered 2016-02-16: 500 mL via INTRAVENOUS

## 2016-02-16 MED ORDER — VITAMIN B-12 1000 MCG PO TABS
1000.0000 ug | ORAL_TABLET | Freq: Every day | ORAL | Status: DC
  Administered 2016-02-16 – 2016-02-22 (×7): 1000 ug via ORAL
  Filled 2016-02-16 (×7): qty 1

## 2016-02-16 MED ORDER — VITAMIN C 500 MG PO TABS
500.0000 mg | ORAL_TABLET | Freq: Two times a day (BID) | ORAL | Status: DC
  Administered 2016-02-16 – 2016-02-23 (×14): 500 mg via ORAL
  Filled 2016-02-16 (×14): qty 1

## 2016-02-16 MED ORDER — OCTREOTIDE ACETATE 100 MCG/ML IJ SOLN
100.0000 ug | Freq: Three times a day (TID) | INTRAMUSCULAR | Status: DC
  Administered 2016-02-16 – 2016-02-22 (×18): 100 ug via SUBCUTANEOUS
  Filled 2016-02-16 (×22): qty 1

## 2016-02-16 MED ORDER — DOCUSATE SODIUM 100 MG PO CAPS: 200.0000 mg | ORAL_CAPSULE | Freq: Every day | ORAL | Status: DC

## 2016-02-16 MED ORDER — APIXABAN 5 MG PO TABS
5.0000 mg | ORAL_TABLET | Freq: Two times a day (BID) | ORAL | Status: DC
  Administered 2016-02-16 – 2016-02-18 (×5): 5 mg via ORAL
  Filled 2016-02-16 (×5): qty 1

## 2016-02-16 MED ORDER — PANTOPRAZOLE SODIUM 40 MG PO TBEC
40.0000 mg | DELAYED_RELEASE_TABLET | Freq: Two times a day (BID) | ORAL | Status: DC
  Administered 2016-02-16 – 2016-02-23 (×14): 40 mg via ORAL
  Filled 2016-02-16 (×14): qty 1

## 2016-02-16 MED ORDER — CITALOPRAM HYDROBROMIDE 20 MG PO TABS
40.0000 mg | ORAL_TABLET | Freq: Every day | ORAL | Status: DC
  Administered 2016-02-16 – 2016-02-22 (×7): 40 mg via ORAL
  Filled 2016-02-16 (×7): qty 2

## 2016-02-16 MED ORDER — BISACODYL 5 MG PO TBEC: 10.0000 mg | DELAYED_RELEASE_TABLET | Freq: Every day | ORAL | Status: DC | PRN

## 2016-02-16 MED ORDER — LOSARTAN POTASSIUM 25 MG PO TABS
25.0000 mg | ORAL_TABLET | Freq: Every day | ORAL | Status: DC
  Administered 2016-02-17 – 2016-02-23 (×7): 25 mg via ORAL
  Filled 2016-02-16 (×7): qty 1

## 2016-02-16 MED ORDER — BISACODYL 10 MG RE SUPP: 10.0000 mg | Freq: Every day | RECTAL | Status: DC | PRN

## 2016-02-16 MED ORDER — MOMETASONE FURO-FORMOTEROL FUM 200-5 MCG/ACT IN AERO
2.0000 | INHALATION_SPRAY | Freq: Two times a day (BID) | RESPIRATORY_TRACT | Status: DC
  Administered 2016-02-17 – 2016-02-23 (×13): 2 via RESPIRATORY_TRACT
  Filled 2016-02-16: qty 8.8

## 2016-02-16 MED ORDER — NITROGLYCERIN 0.4 MG SL SUBL: 0.4000 mg | SUBLINGUAL_TABLET | SUBLINGUAL | Status: DC | PRN

## 2016-02-16 MED ORDER — ONDANSETRON HCL 4 MG/2ML IJ SOLN
4.0000 mg | INTRAMUSCULAR | Status: DC | PRN
  Administered 2016-02-19 – 2016-02-20 (×3): 4 mg via INTRAVENOUS
  Filled 2016-02-16 (×3): qty 2

## 2016-02-16 MED ORDER — IPRATROPIUM-ALBUTEROL 0.5-2.5 (3) MG/3ML IN SOLN: 3.0000 mL | Freq: Four times a day (QID) | RESPIRATORY_TRACT | Status: DC | PRN

## 2016-02-16 MED ORDER — ATORVASTATIN CALCIUM 40 MG PO TABS
40.0000 mg | ORAL_TABLET | Freq: Every day | ORAL | Status: DC
  Administered 2016-02-17 – 2016-02-22 (×6): 40 mg via ORAL
  Filled 2016-02-16 (×6): qty 1

## 2016-02-16 MED ORDER — TIOTROPIUM BROMIDE MONOHYDRATE 18 MCG IN CAPS
18.0000 ug | ORAL_CAPSULE | Freq: Every day | RESPIRATORY_TRACT | Status: DC
  Administered 2016-02-17 – 2016-02-23 (×7): 18 ug via RESPIRATORY_TRACT
  Filled 2016-02-16 (×2): qty 5

## 2016-02-16 MED ORDER — SODIUM CHLORIDE 0.9 % IV SOLN
10.0000 mL/h | Freq: Once | INTRAVENOUS | Status: AC
  Administered 2016-02-16: 10 mL/h via INTRAVENOUS

## 2016-02-16 MED ORDER — HYDROCORTISONE 2.5 % RE CREA
1.0000 "application " | TOPICAL_CREAM | Freq: Two times a day (BID) | RECTAL | Status: DC
  Administered 2016-02-20 – 2016-02-22 (×4): 1 via RECTAL
  Filled 2016-02-16: qty 28.35

## 2016-02-16 MED ORDER — LEVOTHYROXINE SODIUM 100 MCG PO TABS
100.0000 ug | ORAL_TABLET | Freq: Every day | ORAL | Status: DC
  Administered 2016-02-16 – 2016-02-22 (×7): 100 ug via ORAL
  Filled 2016-02-16 (×7): qty 1

## 2016-02-16 MED ORDER — POTASSIUM CHLORIDE ER 10 MEQ PO TBCR
10.0000 meq | EXTENDED_RELEASE_TABLET | ORAL | Status: DC | PRN
  Filled 2016-02-16: qty 1

## 2016-02-16 NOTE — ED Notes (Signed)
Pt presents via POV c/o weakness x 2-3 days.  Pt is LVAD pt. Per wife pt can only walk short distance and becomes very weak.  Pt a x 4, NAD.  Pt denies bloody stool or vomiting.   Denies SOB/CP.  Molly LVAD coodinator at bedside.  MAP-80, P-72 O2-100%RA, R-16.  Denies pain.  Pt currently taking eliquis.

## 2016-02-16 NOTE — Progress Notes (Signed)
Patient's O2 sats sustaining 88% on EA while sleeping. Patient declined wearing c-pap tonight so O2 applied 2L/[place]. Sats now 93%. Will continue to monitor closely.

## 2016-02-16 NOTE — Progress Notes (Signed)
Patient's doppler B/P sustaining 50"s. Sula Rumple, RN's checked with doppler. MD notified and new order received to give 523ml NS bolus over one hour. Will continue to monitor closely.

## 2016-02-16 NOTE — ED Notes (Signed)
RR RN notified for transportation.

## 2016-02-16 NOTE — H&P (Signed)
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 Quit smoking 2003. Uses CPAP and O2 every night. He is s/p LVAD HM II implanted 01/12/13 under DT criteria. ICD extracted 2016.  GI Events  03/2011- EGD/Colonscopy  Polyp. CT abd with cirrhosis 06/2013 Push Enteroscopy and Colonoscopy negative 08/2014 Enteroscopy Bleeding from jejunum with 3 clips 11/2015  Enteroscopy normal 11/2015 Capsule Proximal small AVMs, 2 polyps  Over the last few days he has had increased leg fatigue. Has had a couple episodes of dizziness. Denies black/bloody stools. Mild dyspnea with exertion. Weight at home 218 pounds.   Today his wife called the VAD coordinator and reported presyncope and weakness. He is FOBT +. Hgb has dropped from 10.4 to 8.6. Also noted to be A fib with controlled rated. 1UPRBC ordered. CXR was ok.   LVAD INTERROGATION:  HeartMate II LVAD:  Flow 4.4  liters/min, speed 9200, power 5.2, PI 5.6      Review of Systems: [y] = yes, [ ]  = no   General: Weight gain [ ] ; Weight loss [ ] ; Anorexia [ ] ; Fatigue [ Y]; Fever [ ] ; Chills [ ] ; Weakness [ Y]  Cardiac: Chest pain/pressure [ ] ; Resting SOB [ ] ; Exertional SOB [Y ]; Orthopnea [ ] ; Pedal Edema [ ] ; Palpitations [ ] ; Syncope [ ] ; Presyncope [ ] ; Paroxysmal nocturnal dyspnea[ ]   Pulmonary: Cough [ ] ; Wheezing[ ] ; Hemoptysis[ ] ; Sputum [ ] ; Snoring [ ]   GI: Vomiting[ ] ; Dysphagia[ ] ; Melena[ ] ; Hematochezia [ ] ; Heartburn[ ] ; Abdominal pain [ ] ; Constipation [ ] ; Diarrhea [ ] ; BRBPR [ ]   GU: Hematuria[ ] ; Dysuria [ ] ; Nocturia[ ]   Vascular: Pain in legs with walking [ ] ; Pain in feet with lying flat [ ] ; Non-healing sores [ ] ; Stroke [ ] ; TIA [ ] ; Slurred speech [ ] ;  Neuro: Headaches[ ] ; Vertigo[ ] ; Seizures[ ] ; Paresthesias[  ];Blurred vision [ ] ; Diplopia [ ] ; Vision changes [ ]   Ortho/Skin: Arthritis [ ] ; Joint pain [Y ]; Muscle pain [ ] ; Joint swelling [ ] ; Back Pain [ ] ; Rash [ ]   Psych: Depression[ ] ; Anxiety[ ]   Heme: Bleeding problems [ ] ; Clotting disorders [ ] ; Anemia [ ]   Endocrine: Diabetes [ ] ; Thyroid dysfunction[Y ]  Home Medications Prior to Admission medications   Medication Sig Start Date End Date Taking? Authorizing Provider  albuterol (PROVENTIL) (2.5 MG/3ML) 0.083% nebulizer solution Take 2.5 mg by nebulization every 6 (six) hours as needed for wheezing or shortness of breath.   Yes Historical Provider, MD  amiodarone (PACERONE) 200 MG tablet Take 400 mg (2 tablets) twice daily until 12/17/15, then decrease back to 400 mg (2 tablets) daily. Patient taking differently: Take 200 mg by mouth 2 (two) times daily. Take 400 mg (2 tablets) twice daily until 12/17/15, then decrease back to 400 mg (2 tablets) daily. 12/14/15  Yes Shirley Friar, PA-C  apixaban (ELIQUIS) 5 MG TABS tablet Take 1 tablet (5 mg total) by mouth 2 (two) times daily. 12/25/15  Yes Jolaine Artist, MD  budesonide-formoterol [facility]) 160-4.5 MCG/ACT inhaler Inhale 2 puffs into the lungs 2 (two) times daily. 01/10/15  Yes Jolaine Artist, MD  cholecalciferol (VITAMIN D) 1000 UNITS tablet  Take 1 tablet (1,000 Units total) by mouth 2 (two) times daily. 01/10/15  Yes Jolaine Artist, MD  citalopram (CELEXA) 40 MG tablet Take 1 tablet (40 mg total) by mouth at bedtime. 01/10/15  Yes Jolaine Artist, MD  docusate sodium (COLACE) 100 MG capsule Take 200 mg by mouth 2 (two) times daily.   Yes Historical Provider, MD  furosemide (LASIX) 40 MG tablet Take 40 mg by mouth daily as needed (for weight more than 225 lbs). Reported on 12/21/2015   Yes Historical Provider, MD  hydrocortisone (ANUSOL-HC) 2.5 % rectal cream Place 1 application rectally 2 (two) times daily. Patient taking differently: Place 1 application rectally 2 (two)  times daily as needed for hemorrhoids.  11/21/15  Yes Larey Dresser, MD  levothyroxine (SYNTHROID, LEVOTHROID) 50 MCG tablet Take 50 mcg by mouth at bedtime.   Yes Historical Provider, MD  losartan (COZAAR) 25 MG tablet Take 1 tablet (25 mg total) by mouth daily. 01/23/16  Yes Jolaine Artist, MD  Multiple Vitamin (MULTIVITAMIN WITH MINERALS) TABS tablet Take 1 tablet by mouth daily.   Yes Historical Provider, MD  nitroGLYCERIN (NITROSTAT) 0.4 MG SL tablet Place 0.4 mg under the tongue every 5 (five) minutes as needed for chest pain. Reported on 01/23/2016   Yes Historical Provider, MD  pantoprazole (PROTONIX) 40 MG tablet Take 1 tablet (40 mg total) by mouth 2 (two) times daily. 01/10/15  Yes Shaune Pascal Bensimhon, MD  potassium chloride SA (K-DUR,KLOR-CON) 20 MEQ tablet Take 40 mEq by mouth daily as needed (with furosemide dose).   Yes Historical Provider, MD  PRESCRIPTION MEDICATION Iron infusion at cancer center - last infustion 1st week of May   Yes Historical Provider, MD  simvastatin (ZOCOR) 80 MG tablet Take 0.5 tablets (40 mg total) by mouth at bedtime. 01/10/15  Yes Jolaine Artist, MD  tiotropium (SPIRIVA) 18 MCG inhalation capsule Place 1 capsule (18 mcg total) into inhaler and inhale daily. 01/10/15  Yes Jolaine Artist, MD  vitamin B-12 (CYANOCOBALAMIN) 1000 MCG tablet Take 1,000 mcg by mouth at bedtime.   Yes Historical Provider, MD  vitamin C (ASCORBIC ACID) 500 MG tablet Take 1 tablet (500 mg total) by mouth 2 (two) times daily. 01/10/15  Yes Jolaine Artist, MD    Past Medical History: Past Medical History  Diagnosis Date  . Ischemic cardiomyopathy      CABG 2003, PCI 2007  EF 27%(myoview 2012  . Chronic systolic heart failure ([place])   . Hyperlipidemia   . Hypothyroidism   . Chronic anticoagulation     Afib and LVAD  . Obesity   . COPD (chronic obstructive pulmonary disease) ([place])   . Asbestosis([place])     "6 years in the [place]" (05/24/2013)  . Atrial fibrillation ([place])       permanent  . Paroxysmal ventricular tachycardia ([place])   . Coronary artery disease   . Hypertension   . Depression   . LVAD (left ventricular assist device) present ([place]) 12/2012  . Epistaxis 11/2015, 07/2014  . Cancer ([place])     "scraped some off behind my left ear; fast moving; having it cut out 12/05/2015" (11/07/2015)  . CHF (congestive heart failure) ([place])   . Myocardial infarction ([place]) 1990's-2000    "2 in  ~ the 1990's; 1 in ~ 2000" (11/07/2015)  . On home oxygen therapy     "have it available; don't use it" (11/07/2015)  . OSA on CPAP   . Anemia   .  History of blood transfusion 08/2015 X 10; 11/2015 X 2    "related to bleeding on the inside somewhere" (11/07/2015)  . Complication of anesthesia     "they can't put me all the way to sleep cause of my heart" (11/07/2015)    Past Surgical History: Past Surgical History  Procedure Laterality Date  . Coronary artery bypass graft  2003    "?X4" (05/24/2013)  . Cardiac defibrillator placement  2004; ~ 2010    "cut it out in 2016 after it got infected"   . Insertion of implantable left ventricular assist device N/A 01/12/2013    Procedure: INSERTION OF IMPLANTABLE LEFT VENTRICULAR ASSIST DEVICE;  Surgeon: Ivin Poot, MD;  Location: [place];  Service: Open Heart Surgery;  Laterality: N/A;   nitric oxide; Redo sternotomy  . Intraoperative transesophageal echocardiogram N/A 01/12/2013    Procedure: INTRAOPERATIVE TRANSESOPHAGEAL ECHOCARDIOGRAM;  Surgeon: Ivin Poot, MD;  Location: [place];  Service: Open Heart Surgery;  Laterality: N/A;  . Colonoscopy N/A 03/09/2014    Procedure: COLONOSCOPY;  Surgeon: Inda Castle, MD;  Location: [place];  Service: Endoscopy;  Laterality: N/A;  LVAD  patient  . Esophagogastroduodenoscopy N/A 03/09/2014    Procedure: ESOPHAGOGASTRODUODENOSCOPY (EGD);  Surgeon: Inda Castle, MD;  Location: [place];  Service: Endoscopy;  Laterality: N/A;  . Givens capsule study N/A 03/10/2014    Procedure: GIVENS  CAPSULE STUDY;  Surgeon: Inda Castle, MD;  Location: [place];  Service: Endoscopy;  Laterality: N/A;  . Enteroscopy N/A 07/27/2014    Procedure: ENTEROSCOPY;  Surgeon: Gatha Mayer, MD;  Location: [place];  Service: Endoscopy;  Laterality: N/A;  LVAD patient   . Colonoscopy N/A 07/29/2014    Procedure: COLONOSCOPY;  Surgeon: Gatha Mayer, MD;  Location: [place];  Service: Endoscopy;  Laterality: N/A;  . Givens capsule study N/A 07/30/2014    Procedure: GIVENS CAPSULE STUDY;  Surgeon: Gatha Mayer, MD;  Location: [place];  Service: Endoscopy;  Laterality: N/A;  . Esophagogastroduodenoscopy N/A 08/01/2014    Procedure: ESOPHAGOGASTRODUODENOSCOPY (EGD);  Surgeon: Jerene Bears, MD;  Location: [facility] ENDOSCOPY;  Service: Endoscopy;  Laterality: N/A;  LVAD patient  . Left and right heart catheterization with coronary/graft angiogram  01/07/2013    Procedure: LEFT AND RIGHT HEART CATHETERIZATION WITH Beatrix Fetters;  Surgeon: Jolaine Artist, MD;  Location[facility] CATH LAB;  Service: Cardiovascular;;  . Enteroscopy N/A 09/27/2014    Procedure: ENTEROSCOPY;  Surgeon: Gatha Mayer, MD;  Location: [facility] ENDOSCOPY;  Service: Endoscopy;  Laterality: N/A;  . Icd lead removal Left 03/16/2015    Procedure: ICD LEAD REMOVAL/EXTRACTION;  Surgeon: Evans Lance, MD;  Location: [place];  Service: Cardiovascular;  Laterality: Left;  PATIENT HAS LVAD  DR. VAN TRIGT TO BACK UP EXTRACTION  . Appendectomy    . Enteroscopy N/A 11/09/2015    Procedure: ENTEROSCOPY;  Surgeon: Mauri Pole, MD;  Location[facility] ENDOSCOPY;  Service: Endoscopy;  Laterality: N/A;  LVAD  . Givens capsule study N/A 12/11/2015    Procedure: GIVENS CAPSULE STUDY;  Surgeon: Gatha Mayer, MD;  Location: [place];  Service: Endoscopy;  Laterality: N/A;  . Enteroscopy N/A 12/13/2015    Procedure: ENTEROSCOPY;  Surgeon: Milus Banister, MD;  Location: [place];  Service: Endoscopy;  Laterality: N/A;     Family History: Family History  Problem Relation Age of Onset  . Heart attack Mother   . Heart attack Father     Social History:  Social History   Social History  . Marital Status: Married    Spouse Name: N/A  . Number of Children: N/A  . Years of Education: N/A   Social History Main Topics  . Smoking status: Former Smoker -- 2.00 packs/day for 45 years    Types: Cigarettes    Quit date: 11/11/2001  . Smokeless tobacco: Never Used  . Alcohol Use: Yes     Comment: 05/24/2013 "use to drink beer; hardly nothing since 2003; nothing at all since 12/2011 when I got my LVAD  "  . Drug Use: No  . Sexual Activity: No   Other Topics Concern  . None   Social History Narrative    Allergies:  No Known Allergies  Objective:    Vital Signs:   Temp:  [97.7 F (36.5 C)-98 F (36.7 C)] 98 F (36.7 C) (05/19 1626) Pulse Rate:  [64-116] 64 (05/19 1615) Resp:  [16-24] 22 (05/19 1615) BP: (92-95)/(78) 95/78 mmHg (05/19 1626) SpO2:  [97 %-100 %] 98 % (05/19 1615)   There were no vitals filed for this visit.  Mean arterial Pressure 80  Physical Exam: General:  Elderly pale. No resp difficulty HEENT: normal Neck: supple. JVP 6-7  . Carotids 2+ bilat; no bruits. No lymphadenopathy or thryomegaly appreciated. Cor: Mechanical heart sounds with LVAD hum present.Irregular  Lungs: clear Abdomen: soft, nontender, nondistended. No hepatosplenomegaly. No bruits or masses. Good bowel sounds. Driveline: C/D/I; securement device intact and driveline incorporated Extremities: no cyanosis, clubbing, rash, edema Neuro: alert & orientedx3, cranial nerves grossly intact. moves all 4 extremities w/o difficulty. Affect pleasant  Telemetry: A fib 70s   Labs: Basic Metabolic Panel:  Recent Labs Lab 02/16/16 1320  NA 137  K 4.2  CL 106  CO2 23  GLUCOSE 108*  BUN 26*  CREATININE 1.07  CALCIUM 9.7    Liver Function Tests: No results for input(s): AST, ALT, ALKPHOS, BILITOT, PROT,  ALBUMIN in the last 168 hours. No results for input(s): LIPASE, AMYLASE in the last 168 hours. No results for input(s): AMMONIA in the last 168 hours.  CBC:  Recent Labs Lab 02/16/16 1320  WBC 7.9  HGB 8.6*  HCT 30.4*  MCV 95.6  PLT 169    Cardiac Enzymes: No results for input(s): CKTOTAL, CKMB, CKMBINDEX, TROPONINI in the last 168 hours.  BNP: BNP (last 3 results)  Recent Labs  07/04/15 1147 08/22/15 1300 11/07/15 1043  BNP 267.1* 203.2* 398.1*    ProBNP (last 3 results) No results for input(s): PROBNP in the last 8760 hours.   CBG: No results for input(s): GLUCAP in the last 168 hours.  Coagulation Studies: No results for input(s): LABPROT, INR in the last 72 hours.  Other results: EKG: A fib 78 bpm  Imaging: Dg Chest Portable 1 View  02/16/2016  CLINICAL DATA:  Weakness today no chest pain. EXAM: PORTABLE CHEST 1 VIEW COMPARISON:  4/3 / 17 FINDINGS: Spine wires overlie stable enlarged cardiac silhouette. LVAD device noted. No pulmonary edema. No pneumothorax. No pleural fluid identified. IMPRESSION: No pulmonary edema.  Stable cardiomegaly. Electronically Signed   By: Suzy Bouchard M.D.   On: 02/16/2016 13:55         Assessment:   1. Symptomatic Anemia 2. Recurrent GIB  -- H/O GI bleed.  -- Enteroscopy 12/15 showed bleed from jejunum requiring 3 clips  -- Enteroscopy 2/17 Normal  -- Capsule 12/11/15 2 proximal small bowel AVMs, 2 polyps - "terrible prep"  -- Enteroscopy 12/13/15 Normal, No  AVMs or residual blood found. 3. Recurrent AF 4. Chronic Systolic HF: S/P LVAD for DT 12/2012  5. OSA 6. NSVT  Plan/Discussion:    Admitted today for symptomatic anemia. Hgb dropped 2 grams. FOBT + . He is receiving 1UPRBC. Follow CBCs closely. Start octreotide .  For now continue eliquis. May need to stop. Hold off on GI consult.   LVAD parameters stabe. HF stable. Renal function stable.   I reviewed the LVAD parameters from today, and  compared the results to the patient's prior recorded data.  No programming changes were made.  The LVAD is functioning within specified parameters.  The patient performs LVAD self-test daily.  LVAD interrogation was negative for any significant power changes, alarms or PI events/speed drops.  LVAD equipment check completed and is in good working order.  Back-up equipment present.   LVAD education done on emergency procedures and precautions and reviewed exit site care.  Admit to [place].   Length of Stay: 0  Amy Clegg NP-C  02/16/2016, 4:40 PM  VAD Team Pager [phone] (7am - 7am) +++VAD ISSUES ONLY+++   Advanced Heart Failure Team Pager [phone] (M-F; [place])  Please contact [facility] for night-coverage after hours (4p -7a ) and weekends on [email or web address] for all non- LVAD Issues  Patient seen and examined with [name], NP. We discussed all aspects of the encounter. I agree with the assessment and plan as stated above.   Now with recurrent GI bleed and presyncope. Also back in AF. I think anemia is major issue. Will transfuse and start octreotide. Continue apixaban for now. Will not attempt DC-CV until we are sure we can continue anticoag safely. VAD parameters ok .  Bensimhon, Daniel,MD 6:10 PM

## 2016-02-16 NOTE — ED Provider Notes (Addendum)
CSN: [id number]     Arrival date & time 02/16/16  1223 History   First MD Initiated Contact with Patient 02/16/16 1302     Chief Complaint  Patient presents with  . Weakness     (Consider location/radiation/quality/duration/timing/severity/associated sxs/prior Treatment) HPI 75 year old male history of ischemic cardiomyopathy with LVAD device presents today with increasing fatigue, weakness, and dyspnea. He has had similar symptoms in the past that presented with anemia secondary to GI bleeding. He has not noted any blood in his stool but states that he has not had blood in his stool in the past when he has been GI bleeding. He denies any lateralized or focal neuro deficit. He describes this as general lower extremity weakness. He states over the past several days it has increased. Patient is also seen by LVAD nurse at the same time as I am in there evaluating him. Past Medical History  Diagnosis Date  . Ischemic cardiomyopathy      CABG 2003, PCI 2007  EF 27%(myoview 2012  . Chronic systolic heart failure ([place])   . Hyperlipidemia   . Hypothyroidism   . Chronic anticoagulation     Afib and LVAD  . Obesity   . COPD (chronic obstructive pulmonary disease) ([place])   . Asbestosis(501)     "6 years in the [place]" (05/24/2013)  . Atrial fibrillation ([place])     permanent  . Paroxysmal ventricular tachycardia ([place])   . Coronary artery disease   . Hypertension   . Depression   . LVAD (left ventricular assist device) present ([place]) 12/2012  . Epistaxis 11/2015, 07/2014  . Cancer ([place])     "scraped some off behind my left ear; fast moving; having it cut out 12/05/2015" (11/07/2015)  . CHF (congestive heart failure) ([place])   . Myocardial infarction ([place]) 1990's-2000    "2 in  ~ the 1990's; 1 in ~ 2000" (11/07/2015)  . On home oxygen therapy     "have it available; don't use it" (11/07/2015)  . OSA on CPAP   . Anemia   . History of blood transfusion 08/2015 X 10; 11/2015 X 2    "related to bleeding on the  inside somewhere" (11/07/2015)  . Complication of anesthesia     "they can't put me all the way to sleep cause of my heart" (11/07/2015)   Past Surgical History  Procedure Laterality Date  . Coronary artery bypass graft  2003    "?X4" (05/24/2013)  . Cardiac defibrillator placement  2004; ~ 2010    "cut it out in 2016 after it got infected"   . Insertion of implantable left ventricular assist device N/A 01/12/2013    Procedure: INSERTION OF IMPLANTABLE LEFT VENTRICULAR ASSIST DEVICE;  Surgeon: Ivin Poot, MD;  Location: [facility];  Service: Open Heart Surgery;  Laterality: N/A;   nitric oxide; Redo sternotomy  . Intraoperative transesophageal echocardiogram N/A 01/12/2013    Procedure: INTRAOPERATIVE TRANSESOPHAGEAL ECHOCARDIOGRAM;  Surgeon: Ivin Poot, MD;  Location: [facility];  Service: Open Heart Surgery;  Laterality: N/A;  . Colonoscopy N/A 03/09/2014    Procedure: COLONOSCOPY;  Surgeon: Inda Castle, MD;  Location: [facility];  Service: Endoscopy;  Laterality: N/A;  LVAD  patient  . Esophagogastroduodenoscopy N/A 03/09/2014    Procedure: ESOPHAGOGASTRODUODENOSCOPY (EGD);  Surgeon: Inda Castle, MD;  Location: [facility];  Service: Endoscopy;  Laterality: N/A;  . Givens capsule study N/A 03/10/2014    Procedure: GIVENS CAPSULE STUDY;  Surgeon: Inda Castle, MD;  Location:  [facility];  Service: Endoscopy;  Laterality: N/A;  . Enteroscopy N/A 07/27/2014    Procedure: ENTEROSCOPY;  Surgeon: Gatha Mayer, MD;  Location: [facility];  Service: Endoscopy;  Laterality: N/A;  LVAD patient   . Colonoscopy N/A 07/29/2014    Procedure: COLONOSCOPY;  Surgeon: Gatha Mayer, MD;  Location: [facility];  Service: Endoscopy;  Laterality: N/A;  . Givens capsule study N/A 07/30/2014    Procedure: GIVENS CAPSULE STUDY;  Surgeon: Gatha Mayer, MD;  Location: [facility];  Service: Endoscopy;  Laterality: N/A;  . Esophagogastroduodenoscopy N/A 08/01/2014    Procedure:  ESOPHAGOGASTRODUODENOSCOPY (EGD);  Surgeon: Jerene Bears, MD;  Location[facility] ENDOSCOPY;  Service: Endoscopy;  Laterality: N/A;  LVAD patient  . Left and right heart catheterization with coronary/graft angiogram  01/07/2013    Procedure: LEFT AND RIGHT HEART CATHETERIZATION WITH Beatrix Fetters;  Surgeon: Jolaine Artist, MD;  Location: [facility] CATH LAB;  Service: Cardiovascular;;  . Enteroscopy N/A 09/27/2014    Procedure: ENTEROSCOPY;  Surgeon: Gatha Mayer, MD;  Location: [facility] ENDOSCOPY;  Service: Endoscopy;  Laterality: N/A;  . Icd lead removal Left 03/16/2015    Procedure: ICD LEAD REMOVAL/EXTRACTION;  Surgeon: Evans Lance, MD;  Location: [facility];  Service: Cardiovascular;  Laterality: Left;  PATIENT HAS LVAD  DR. VAN TRIGT TO BACK UP EXTRACTION  . Appendectomy    . Enteroscopy N/A 11/09/2015    Procedure: ENTEROSCOPY;  Surgeon: Mauri Pole, MD;  Location: [facility] ENDOSCOPY;  Service: Endoscopy;  Laterality: N/A;  LVAD  . Givens capsule study N/A 12/11/2015    Procedure: GIVENS CAPSULE STUDY;  Surgeon: Gatha Mayer, MD;  Location: [place];  Service: Endoscopy;  Laterality: N/A;  . Enteroscopy N/A 12/13/2015    Procedure: ENTEROSCOPY;  Surgeon: Milus Banister, MD;  Location: [facility];  Service: Endoscopy;  Laterality: N/A;   Family History  Problem Relation Age of Onset  . Heart attack Mother   . Heart attack Father    Social History  Substance Use Topics  . Smoking status: Former Smoker -- 2.00 packs/day for 45 years    Types: Cigarettes    Quit date: 11/11/2001  . Smokeless tobacco: Never Used  . Alcohol Use: Yes     Comment: 05/24/2013 "use to drink beer; hardly nothing since 2003; nothing at all since 12/2011 when I got my LVAD  "    Review of Systems  Constitutional: Positive for activity change and fatigue.  HENT: Negative.   Eyes: Negative.   Respiratory: Positive for shortness of breath.   Cardiovascular: Negative for chest pain.  Gastrointestinal:  Negative.   Endocrine: Negative.   Genitourinary: Negative.   Musculoskeletal: Negative.   Skin: Negative.   Allergic/Immunologic: Negative.   Neurological: Positive for weakness.  Hematological: Negative.   Psychiatric/Behavioral: Negative.   All other systems reviewed and are negative.     Allergies  Review of patient's allergies indicates no known allergies.  Home Medications   Prior to Admission medications   Medication Sig Start Date End Date Taking? Authorizing Provider  amiodarone (PACERONE) 200 MG tablet Take 400 mg (2 tablets) twice daily until 12/17/15, then decrease back to 400 mg (2 tablets) daily. 12/14/15   Shirley Friar, PA-C  apixaban (ELIQUIS) 5 MG TABS tablet Take 1 tablet (5 mg total) by mouth 2 (two) times daily. 12/25/15   Jolaine Artist, MD  budesonide-formoterol (SYMBICORT) 160-4.5 MCG/ACT inhaler Inhale 2 puffs into the lungs 2 (two) times daily. Patient  taking differently: Inhale 2 puffs into the lungs daily.  01/10/15   Jolaine Artist, MD  cholecalciferol (VITAMIN D) 1000 UNITS tablet Take 1 tablet (1,000 Units total) by mouth 2 (two) times daily. 01/10/15   Jolaine Artist, MD  citalopram (CELEXA) 40 MG tablet Take 1 tablet (40 mg total) by mouth at bedtime. 01/10/15   Jolaine Artist, MD  docusate sodium (COLACE) 100 MG capsule Take 200 mg by mouth 2 (two) times daily.    Historical Provider, MD  furosemide (LASIX) 40 MG tablet Take 40 mg by mouth as needed for fluid. Reported on 12/21/2015    Historical Provider, MD  hydrocortisone (ANUSOL-HC) 2.5 % rectal cream Place 1 application rectally 2 (two) times daily. 11/21/15   Larey Dresser, MD  ipratropium-albuterol (DUONEB) 0.5-2.5 (3) MG/3ML SOLN Take 3 mLs by nebulization every 6 (six) hours as needed (copd).    Historical Provider, MD  levothyroxine (SYNTHROID, LEVOTHROID) 100 MCG tablet Take 100 mcg by mouth at bedtime.     Historical Provider, MD  losartan (COZAAR) 25 MG tablet Take 1  tablet (25 mg total) by mouth daily. 01/23/16   Jolaine Artist, MD  Multiple Vitamins-Minerals (MULTIVITAMIN PO) Take 1 tablet by mouth daily.     Historical Provider, MD  nitroGLYCERIN (NITROSTAT) 0.4 MG SL tablet Place 0.4 mg under the tongue every 5 (five) minutes as needed for chest pain. Reported on 01/23/2016    Historical Provider, MD  pantoprazole (PROTONIX) 40 MG tablet Take 1 tablet (40 mg total) by mouth 2 (two) times daily. 01/10/15   Jolaine Artist, MD  potassium chloride (K-DUR) 10 MEQ tablet Take 10 mEq by mouth as needed (when taking furosemide). Reported on 12/21/2015    Historical Provider, MD  simvastatin (ZOCOR) 80 MG tablet Take 0.5 tablets (40 mg total) by mouth at bedtime. 01/10/15   Jolaine Artist, MD  tiotropium (SPIRIVA) 18 MCG inhalation capsule Place 1 capsule (18 mcg total) into inhaler and inhale daily. 01/10/15   Jolaine Artist, MD  vitamin B-12 (CYANOCOBALAMIN) 1000 MCG tablet Take 1,000 mcg by mouth at bedtime.    Historical Provider, MD  vitamin C (ASCORBIC ACID) 500 MG tablet Take 1 tablet (500 mg total) by mouth 2 (two) times daily. 01/10/15   Shaune Pascal Bensimhon, MD   BP 92/78 mmHg  Pulse 76  Temp(Src) 97.7 F (36.5 C) (Oral)  Resp 23  SpO2 97% Physical Exam  Constitutional: He is oriented to person, place, and time. He appears well-developed and well-nourished. No distress.  HENT:  Head: Normocephalic and atraumatic.  Eyes: Conjunctivae are normal. Pupils are equal, round, and reactive to light.  Objective are somewhat pale  Neck: Normal range of motion. Neck supple.  Cardiovascular: An irregularly irregular rhythm present.  Pulmonary/Chest: Breath sounds normal. No respiratory distress.  Abdominal: Soft. Bowel sounds are normal.  Genitourinary: Guaiac positive stool.  Musculoskeletal: Normal range of motion.  Neurological: He is alert and oriented to person, place, and time.  Skin: Skin is warm and dry.  Psychiatric: He has a normal mood  and affect. His behavior is normal.  Nursing note and vitals reviewed.   ED Course  Procedures (including critical care time) Labs Review Labs Reviewed  BASIC METABOLIC PANEL - Abnormal; Notable for the following:    Glucose, Bld 108 (*)    BUN 26 (*)    All other components within normal limits  CBC - Abnormal; Notable for the following:  RBC 3.18 (*)    Hemoglobin 8.6 (*)    HCT 30.4 (*)    MCHC 28.3 (*)    RDW 23.7 (*)    All other components within normal limits  LACTATE DEHYDROGENASE - Abnormal; Notable for the following:    LDH 249 (*)    All other components within normal limits  POC OCCULT BLOOD, ED - Abnormal; Notable for the following:    Fecal Occult Bld POSITIVE (*)    All other components within normal limits  URINALYSIS, ROUTINE W REFLEX MICROSCOPIC (NOT AT [facility])  Randolm Idol, ED    Imaging Review Dg Chest Portable 1 View  02/16/2016  CLINICAL DATA:  Weakness today no chest pain. EXAM: PORTABLE CHEST 1 VIEW COMPARISON:  4/3 / 17 FINDINGS: Spine wires overlie stable enlarged cardiac silhouette. LVAD device noted. No pulmonary edema. No pneumothorax. No pleural fluid identified. IMPRESSION: No pulmonary edema.  Stable cardiomegaly. Electronically Signed   By: Suzy Bouchard M.D.   On: 02/16/2016 13:55   I have personally reviewed and evaluated these images and lab results as part of my medical decision-making.   EKG Interpretation   Date/Time:  Friday Feb 16 2016 12:49:46 EDT Ventricular Rate:  78 PR Interval:    QRS Duration: 176 QT Interval:  502 QTC Calculation: 572 R Axis:   -55 Text Interpretation:  Atrial fibrillation Ventricular premature complex  Left bundle branch block Confirmed by Mandeep Kiser MD, Andee Poles [phone]) on  02/16/2016 1:03:42 PM Also confirmed by Cassiel Fernandez MD, Andee Poles [phone]), editor  Gilford Rile, CCT, [place] ([id number])  on 02/16/2016 1:30:22 PM      MDM   Final diagnoses:  Symptomatic anemia  Paroxysmal atrial fibrillation ([place])  LVAD (left  ventricular assist device) present [facility])    75 year old male with LVAD in place who presents today with symptomatic anemia. He is being transfused 1 unit packed red blood cells. This is being done in consultation with Dr. Sung Amabile. Dr. Sung Amabile and the LVAD team will be done to see and admit the patient.  Pattricia Boss, MD 02/16/16 [id number]  Pattricia Boss, MD 02/16/16 [phone]

## 2016-02-16 NOTE — ED Notes (Signed)
[name] VAD coordinator at bedside

## 2016-02-16 NOTE — ED Notes (Signed)
X-ray at bedside

## 2016-02-16 NOTE — ED Notes (Signed)
MD at bedside. 

## 2016-02-16 NOTE — Telephone Encounter (Signed)
Wife called VAD pager to report pt had "weak spell in grocery store". His legs got weak, shaking all over, "was out of it". Instructed to come to ED per EMS. She feels she can drive him, they are 15 mins away. Dr. Haroldine Laws updated. VAD charge nurse notified.

## 2016-02-16 NOTE — Progress Notes (Signed)
VAD coordinator paged to ED. VAD cart taken to pt's room, D34.  Pt awake, talking, wife at bedside. She describes pt having "weak spells with legs giving out" over last week. St\ates hje "went down" one day earlier this week, denies any injuries.  While at grocery store today, pt felt his legs "go weak". Wife says he "nearly went out", was unable to answer questions and didn't know where he was for brief period of time. Denies fall/injuries.   Pt denies BRBPR, describes stools as normal (brown) in color and consistency.  Doppler MAP: 80 Auto cuff BP:  92/78 (85) HR:  77 Sat:  97%  LVAD Parameters: Flow: 4.4 Speed: 9200 PI:  5.6 Power: 5.2 Alarms:  None PI events: rare  Back up black bag present with extra controller, spare battery clips, and two fully charged 14 V batteries.   Telemetry reveals atrial fib.   Dr. Haroldine Laws and [name], NP updated.

## 2016-02-16 NOTE — ED Notes (Signed)
Paged LVAD RN to [place], [place].

## 2016-02-17 DIAGNOSIS — I9589 Other hypotension: Secondary | ICD-10-CM

## 2016-02-17 LAB — LACTATE DEHYDROGENASE: LDH: 180 U/L (ref 98–192)

## 2016-02-17 LAB — CBC
HCT: 28.9 % — ABNORMAL LOW (ref 39.0–52.0)
HEMOGLOBIN: 8.6 g/dL — AB (ref 13.0–17.0)
MCH: 28.4 pg (ref 26.0–34.0)
MCHC: 29.8 g/dL — ABNORMAL LOW (ref 30.0–36.0)
MCV: 95.4 fL (ref 78.0–100.0)
PLATELETS: 145 10*3/uL — AB (ref 150–400)
RBC: 3.03 MIL/uL — AB (ref 4.22–5.81)
RDW: 22.3 % — ABNORMAL HIGH (ref 11.5–15.5)
WBC: 6.3 10*3/uL (ref 4.0–10.5)

## 2016-02-17 LAB — PREPARE RBC (CROSSMATCH)

## 2016-02-17 LAB — PROTIME-INR
INR: 1.3 (ref 0.00–1.49)
Prothrombin Time: 16.3 seconds — ABNORMAL HIGH (ref 11.6–15.2)

## 2016-02-17 MED ORDER — SODIUM CHLORIDE 0.9 % IV SOLN
Freq: Once | INTRAVENOUS | Status: AC
  Administered 2016-02-17: 01:00:00 via INTRAVENOUS

## 2016-02-17 NOTE — Progress Notes (Signed)
HeartMate 2 Rounding Note  Subjective:    MAPs low yesterday. Received IVF and blood. Now 80-90s (I checked personally -> 84)  Received 2u RBCs overnight. hgb 8.6->8.1. No obvious bleeding. Getting octerotide.   LVAD INTERROGATION:  HeartMate II LVAD: Flow 7.2 liters/min, speed 9200, power 7.0, PI 5.5   Objective:    Vital Signs:   Temp:  [97.7 F (36.5 C)-98.5 F (36.9 C)] 98 F (36.7 C) (05/20 1200) Pulse Rate:  [51-126] 62 (05/20 1200) Resp:  [15-25] 21 (05/20 1200) BP: (81-95)/(70-78) 81/70 mmHg (05/19 1645) SpO2:  [87 %-100 %] 91 % (05/20 1200) Weight:  [95.4 kg (210 lb 5.1 oz)-97 kg (213 lb 13.5 oz)] 97 kg (213 lb 13.5 oz) (05/20 0500) Last BM Date: 02/16/16 Mean arterial Pressure 80-90s  Intake/Output:   Intake/Output Summary (Last 24 hours) at 02/17/16 1250 Last data filed at 02/17/16 1100  Gross per 24 hour  Intake   1720 ml  Output    750 ml  Net    970 ml      Physical Exam: General: lying in bed. No resp difficulty HEENT: normal Neck: supple. JVP 6-7 . Carotids 2+ bilat; no bruits. No lymphadenopathy or thryomegaly appreciated. Cor: Mechanical heart sounds with LVAD hum present. Lungs: clear Abdomen: soft, nontender, nondistended. No hepatosplenomegaly. No bruits or masses. Good bowel sounds. Driveline: C/D/I; securement device intact and driveline incorporated Extremities: no cyanosis, clubbing, rash, edema Neuro: alert & orientedx3, cranial nerves grossly intact. moves all 4 extremities w/o difficulty. Affect pleasant  Telemetry: NSR with PVCs  Labs: Basic Metabolic Panel:  Recent Labs Lab 02/16/16 1320  NA 137  K 4.2  CL 106  CO2 23  GLUCOSE 108*  BUN 26*  CREATININE 1.07  CALCIUM 9.7    Liver Function Tests: No results for input(s): AST, ALT, ALKPHOS, BILITOT, PROT, ALBUMIN in the last 168 hours. No results for input(s): LIPASE, AMYLASE in the last 168 hours. No results for input(s): AMMONIA in the last 168  hours.  CBC:  Recent Labs Lab 02/16/16 1320 02/16/16 2213 02/17/16 0722  WBC 7.9 6.2 6.3  HGB 8.6* 8.1* 8.6*  HCT 30.4* 27.6* 28.9*  MCV 95.6 95.5 95.4  PLT 169 148* 145*    INR:  Recent Labs Lab 02/16/16 2213 02/17/16 0722  INR 1.44 1.30    Other results:    Imaging: Dg Chest Portable 1 View  02/16/2016  CLINICAL DATA:  Weakness today no chest pain. EXAM: PORTABLE CHEST 1 VIEW COMPARISON:  4/3 / 17 FINDINGS: Spine wires overlie stable enlarged cardiac silhouette. LVAD device noted. No pulmonary edema. No pneumothorax. No pleural fluid identified. IMPRESSION: No pulmonary edema.  Stable cardiomegaly. Electronically Signed   By: Suzy Bouchard M.D.   On: 02/16/2016 13:55      Medications:     Scheduled Medications: . amiodarone  400 mg Oral BID  . apixaban  5 mg Oral BID  . atorvastatin  40 mg Oral q1800  . cholecalciferol  1,000 Units Oral BID  . citalopram  40 mg Oral QHS  . docusate sodium  200 mg Oral BID  . hydrocortisone  1 application Rectal BID  . levothyroxine  100 mcg Oral QHS  . losartan  25 mg Oral Daily  . mometasone-formoterol  2 puff Inhalation BID  . octreotide  100 mcg Subcutaneous TID  . pantoprazole  40 mg Oral BID  . tiotropium  18 mcg Inhalation Daily  . vitamin B-12  1,000 mcg Oral QHS  .  vitamin C  500 mg Oral BID     Infusions:     PRN Medications:  bisacodyl **OR** bisacodyl, furosemide, ipratropium-albuterol, nitroGLYCERIN, ondansetron, potassium chloride   Assessment:   1. Symptomatic Anemia 2. Recurrent GIB  -- H/O GI bleed.  -- Enteroscopy 12/15 showed bleed from jejunum requiring 3 clips  -- Enteroscopy 2/17 Normal  -- Capsule 12/11/15 2 proximal small bowel AVMs, 2 polyps - "terrible prep"  -- Enteroscopy 12/13/15 Normal, No AVMs or residual blood found. 3. Recurrent AF now back in NSR 4. Chronic Systolic HF: S/P LVAD for DT 12/2012  5. OSA 6. NSVT 7. Hypotension   Plan/Discussion:     Admitted with recurrent GI bleed and presyncope due to hypotension. Also recurrent AF.   BP improved with volume support.   Now s/p 2u RBCs. Hgb with minimal bump but no obvious bleeding. Will continue apixaban. Now on octreotide. Repeat CBC in am. Continue portonix.   Now back in NSR. Continue amio.   MAPs improved. VAD parameters ok . LDH low.   I reviewed the LVAD parameters from today, and compared the results to the patient's prior recorded data.  No programming changes were made.  The LVAD is functioning within specified parameters.  The patient performs LVAD self-test daily.  LVAD interrogation was negative for any significant power changes, alarms or PI events/speed drops.  LVAD equipment check completed and is in good working order.  Back-up equipment present.   LVAD education done on emergency procedures and precautions and reviewed exit site care.  Length of Stay: 1  Dosia Yodice 02/17/2016, 12:50 PM  VAD Team --- VAD ISSUES ONLY--- Pager [phone] (7am - 7am)  Advanced Heart Failure Team  Pager [phone] (M-F; 7a - 4p)  Please contact [facility] for night-coverage after hours (4p -7a ) and weekends on [email or web address]

## 2016-02-18 LAB — CBC
HCT: 26.4 % — ABNORMAL LOW (ref 39.0–52.0)
HCT: 28 % — ABNORMAL LOW (ref 39.0–52.0)
HEMOGLOBIN: 7.5 g/dL — AB (ref 13.0–17.0)
Hemoglobin: 8.2 g/dL — ABNORMAL LOW (ref 13.0–17.0)
MCH: 28.1 pg (ref 26.0–34.0)
MCH: 28.7 pg (ref 26.0–34.0)
MCHC: 29.3 g/dL — ABNORMAL LOW (ref 30.0–36.0)
MCHC: 28.4 g/dL — AB (ref 30.0–36.0)
MCV: 97.9 fL (ref 78.0–100.0)
MCV: 98.9 fL (ref 78.0–100.0)
PLATELETS: 146 10*3/uL — AB (ref 150–400)
PLATELETS: 149 10*3/uL — AB (ref 150–400)
RBC: 2.67 MIL/uL — ABNORMAL LOW (ref 4.22–5.81)
RBC: 2.86 MIL/uL — ABNORMAL LOW (ref 4.22–5.81)
RDW: 22.6 % — AB (ref 11.5–15.5)
RDW: 22.6 % — AB (ref 11.5–15.5)
WBC: 6.9 10*3/uL (ref 4.0–10.5)
WBC: 7.2 10*3/uL (ref 4.0–10.5)

## 2016-02-18 LAB — LACTATE DEHYDROGENASE: LDH: 226 U/L — AB (ref 98–192)

## 2016-02-18 LAB — BASIC METABOLIC PANEL
ANION GAP: 10 (ref 5–15)
BUN: 27 mg/dL — AB (ref 6–20)
CALCIUM: 9.2 mg/dL (ref 8.9–10.3)
CO2: 23 mmol/L (ref 22–32)
Chloride: 105 mmol/L (ref 101–111)
Creatinine, Ser: 0.94 mg/dL (ref 0.61–1.24)
GFR calc Af Amer: >60 mL/min (ref >60)
GLUCOSE: 123 mg/dL — AB (ref 65–99)
Potassium: 4.2 mmol/L (ref 3.5–5.1)
Sodium: 138 mmol/L (ref 135–145)

## 2016-02-18 LAB — PROTIME-INR
INR: 1.45 (ref 0.00–1.49)
Prothrombin Time: 17.7 seconds — ABNORMAL HIGH (ref 11.6–15.2)

## 2016-02-18 NOTE — Progress Notes (Signed)
HeartMate 2 Rounding Note  Subjective:     Received 2u RBCs on admit. 6-7 black BMs overnight.  hgb 8.6->8.2. Getting octerotide. Feels ok. No SOB or CP. In/out AF.   LVAD INTERROGATION:  HeartMate II LVAD: Flow 7.5  liters/min, speed 9200, power 7.0, PI 4.6   Objective:    Vital Signs:   Temp:  [97.6 F (36.4 C)-98.5 F (36.9 C)] 97.6 F (36.4 C) (05/21 1208) Pulse Rate:  [58-59] 58 (05/20 1600) Resp:  [20-24] 20 (05/21 1208) SpO2:  [87 %-99 %] 99 % (05/21 1208) Weight:  [96.1 kg (211 lb 13.8 oz)] 96.1 kg (211 lb 13.8 oz) (05/21 0400) Last BM Date: 02/18/16 Mean arterial Pressure 80-90s  Intake/Output:   Intake/Output Summary (Last 24 hours) at 02/18/16 1413 Last data filed at 02/18/16 1200  Gross per 24 hour  Intake    840 ml  Output    975 ml  Net   -135 ml      Physical Exam: General: lying in bed. No resp difficulty HEENT: normal Neck: supple. JVP flat . Carotids 2+ bilat; no bruits. No lymphadenopathy or thryomegaly appreciated. Cor: Mechanical heart sounds with LVAD hum present. Lungs: clear Abdomen: soft, nontender, nondistended. No hepatosplenomegaly. No bruits or masses. Good bowel sounds. Driveline: C/D/I; securement device intact and driveline incorporated Extremities: no cyanosis, clubbing, rash, edema Neuro: alert & orientedx3, cranial nerves grossly intact. moves all 4 extremities w/o difficulty. Affect pleasant  Telemetry: NSR with PVCs  Labs: Basic Metabolic Panel:  Recent Labs Lab 02/16/16 1320 02/18/16 0329  NA 137 138  K 4.2 4.2  CL 106 105  CO2 23 23  GLUCOSE 108* 123*  BUN 26* 27*  CREATININE 1.07 0.94  CALCIUM 9.7 9.2    Liver Function Tests: No results for input(s): AST, ALT, ALKPHOS, BILITOT, PROT, ALBUMIN in the last 168 hours. No results for input(s): LIPASE, AMYLASE in the last 168 hours. No results for input(s): AMMONIA in the last 168 hours.  CBC:  Recent Labs Lab 02/16/16 1320 02/16/16 2213  02/17/16 0722 02/18/16 0329  WBC 7.9 6.2 6.3 6.9  HGB 8.6* 8.1* 8.6* 8.2*  HCT 30.4* 27.6* 28.9* 28.0*  MCV 95.6 95.5 95.4 97.9  PLT 169 148* 145* 146*    INR:  Recent Labs Lab 02/16/16 2213 02/17/16 0722 02/18/16 0329  INR 1.44 1.30 1.45    Other results:    Imaging: No results found.   Medications:     Scheduled Medications: . amiodarone  400 mg Oral BID  . apixaban  5 mg Oral BID  . atorvastatin  40 mg Oral q1800  . cholecalciferol  1,000 Units Oral BID  . citalopram  40 mg Oral QHS  . docusate sodium  200 mg Oral BID  . hydrocortisone  1 application Rectal BID  . levothyroxine  100 mcg Oral QHS  . losartan  25 mg Oral Daily  . mometasone-formoterol  2 puff Inhalation BID  . octreotide  100 mcg Subcutaneous TID  . pantoprazole  40 mg Oral BID  . tiotropium  18 mcg Inhalation Daily  . vitamin B-12  1,000 mcg Oral QHS  . vitamin C  500 mg Oral BID    Infusions:    PRN Medications: bisacodyl **OR** bisacodyl, furosemide, ipratropium-albuterol, nitroGLYCERIN, ondansetron, potassium chloride   Assessment:   1. Symptomatic Anemia 2. Recurrent GIB  -- H/O GI bleed.  -- Enteroscopy 12/15 showed bleed from jejunum requiring 3 clips  -- Enteroscopy 2/17 Normal  --  Capsule 12/11/15 2 proximal small bowel AVMs, 2 polyps - "terrible prep"  -- Enteroscopy 12/13/15 Normal, No AVMs or residual blood found. 3. Recurrent AF now back in NSR 4. Chronic Systolic HF: S/P LVAD for DT 12/2012  5. OSA 6. NSVT 7. Hypotension   Plan/Discussion:    Admitted with recurrent GI bleed and presyncope due to hypotension. Also recurrent AF. BP improved with volume support.   Now s/p 2u RBCs. Hgb with minimal bump. Now with ongoing melena. Will continue apixaban and octreotide. Repeat CBC in am. Continue protonix. Will keep NPO overnight and ask GI to see in am. Recent endoscopy and capsule relatively unremarkable.   In/out of AF. Continue amio for now.    MAPs improved. VAD parameters ok . LDH low.   I reviewed the LVAD parameters from today, and compared the results to the patient's prior recorded data.  No programming changes were made.  The LVAD is functioning within specified parameters.  The patient performs LVAD self-test daily.  LVAD interrogation was negative for any significant power changes, alarms or PI events/speed drops.  LVAD equipment check completed and is in good working order.  Back-up equipment present.   LVAD education done on emergency procedures and precautions and reviewed exit site care.  Length of Stay: 2  Glori Bickers MD 02/18/2016, 2:13 PM  VAD Team --- VAD ISSUES ONLY--- Pager [phone] (7am - 7am)  Advanced Heart Failure Team  Pager [phone] (M-F; 7a - 4p)  Please contact [facility] for night-coverage after hours (4p -7a ) and weekends on [email or web address]

## 2016-02-19 ENCOUNTER — Other Ambulatory Visit: Payer: No Typology Code available for payment source

## 2016-02-19 ENCOUNTER — Telehealth: Payer: Self-pay | Admitting: Hematology

## 2016-02-19 ENCOUNTER — Telehealth: Payer: Self-pay | Admitting: *Deleted

## 2016-02-19 LAB — BASIC METABOLIC PANEL
Anion gap: 3 — ABNORMAL LOW (ref 5–15)
BUN: 26 mg/dL — AB (ref 6–20)
CALCIUM: 9.4 mg/dL (ref 8.9–10.3)
CHLORIDE: 108 mmol/L (ref 101–111)
CO2: 28 mmol/L (ref 22–32)
CREATININE: 0.99 mg/dL (ref 0.61–1.24)
Glucose, Bld: 119 mg/dL — ABNORMAL HIGH (ref 65–99)
Potassium: 4 mmol/L (ref 3.5–5.1)
SODIUM: 139 mmol/L (ref 135–145)

## 2016-02-19 LAB — CBC
HCT: 26.6 % — ABNORMAL LOW (ref 39.0–52.0)
HEMOGLOBIN: 7.8 g/dL — AB (ref 13.0–17.0)
MCH: 28.7 pg (ref 26.0–34.0)
MCHC: 29.3 g/dL — AB (ref 30.0–36.0)
MCV: 97.8 fL (ref 78.0–100.0)
PLATELETS: 171 10*3/uL (ref 150–400)
RBC: 2.72 MIL/uL — ABNORMAL LOW (ref 4.22–5.81)
RDW: 22.6 % — ABNORMAL HIGH (ref 11.5–15.5)
WBC: 8 10*3/uL (ref 4.0–10.5)

## 2016-02-19 LAB — PREPARE RBC (CROSSMATCH)

## 2016-02-19 LAB — LACTATE DEHYDROGENASE: LDH: 193 U/L — AB (ref 98–192)

## 2016-02-19 LAB — MAGNESIUM: MAGNESIUM: 1.7 mg/dL (ref 1.7–2.4)

## 2016-02-19 LAB — TSH: TSH: 2.373 u[IU]/mL (ref 0.350–4.500)

## 2016-02-19 LAB — PROTIME-INR
INR: 1.51 — AB (ref 0.00–1.49)
PROTHROMBIN TIME: 18.3 s — AB (ref 11.6–15.2)

## 2016-02-19 LAB — C DIFFICILE QUICK SCREEN W PCR REFLEX
C DIFFICILE (CDIFF) TOXIN: NEGATIVE
C Diff antigen: NEGATIVE
C Diff interpretation: NEGATIVE

## 2016-02-19 MED ORDER — AMIODARONE HCL IN DEXTROSE 360-4.14 MG/200ML-% IV SOLN
60.0000 mg/h | INTRAVENOUS | Status: AC
  Administered 2016-02-19: 60 mg/h via INTRAVENOUS
  Filled 2016-02-19: qty 200

## 2016-02-19 MED ORDER — SODIUM CHLORIDE 0.9 % IV SOLN
Freq: Once | INTRAVENOUS | Status: AC
  Administered 2016-02-19: 10:00:00 via INTRAVENOUS

## 2016-02-19 MED ORDER — AMIODARONE HCL IN DEXTROSE 360-4.14 MG/200ML-% IV SOLN
30.0000 mg/h | INTRAVENOUS | Status: DC
  Administered 2016-02-19 – 2016-02-20 (×3): 30 mg/h via INTRAVENOUS
  Filled 2016-02-19 (×4): qty 200

## 2016-02-19 NOTE — Progress Notes (Signed)
Patient's heart rate currently in low 60's to upper 50's, spoke with MD Aundra Dubin regarding IV amiodarone order.  MD Aundra Dubin advised to start IV amiodarone as ordered and if heart rate decreases to contact MD to inform.

## 2016-02-19 NOTE — Consult Note (Signed)
[facility] Consult: 9:07 AM 02/19/2016  LOS: 3 days    Referring Provider: Dr Haroldine Laws  Primary Care Physician:  Thressa Sheller, MD Primary Gastroenterologist:  Dr Silverio Decamp.     Reason for Consultation:  GIB (melena), anemia.     HPI: [name] is a 75 y.o. male.  Chronic Eliquis for Afib and LVAD placed 2014.  ICD removed 2016. Ischemic CM with 27% EF. 2003 CABG.  COPD. OSA on CPAP.  Obesity.   B12 deficiency, on daily oral B12.  Hypothyroidism. 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  Multiple admissions, blood transfusions and GI procedures for recurrent GIB and associated blood loss anemia. Last transfusion, 1 PRBC, 01/05/16. Cirrhosis and cholelithiasis per CT 2014.   03/2011- EGD/Colonscopy Polyp. CT abd with cirrhosis. 07/29/2013 Enteroscopy and Colonoscopy negative 03/09/2014 EGD to D3: superficial erosions at [place].  03/10/2014 Colonoscopy: transverse colon polyp (tubular adenomas), removed and site clipped. Int rrhoids.  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 At 2\' 40"  there was mild inflammation with erythema and shallow erosions. 07/28/2015 Enteroscopy: Irregular Z-line and small hiatal hernia, otherwise normal esophagus, stomach, duodenum and proximal jejunum 07/29/2014 Colonoscopy: 2 sessile polyps in right colon (not removed) , int rrhoids.  07/31/2014 EGD: bleeding in prox duodenum, lesion/source not found.  08/01/2014 Enteroscopy to prox jejunum.  Bleeding AVM in D2 ablated with APC, hemoclip placed. No jejunal lesions.  09/27/2014 Enteroscopy to proximal jejunum. 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  11/09/2015 Enteroscopy to proximal jejunum: normal 12/11/2015 Capsule Endoscopy: 2 AVMs in prox SB, 2 small polyps further into SB. "terrible" prep,  incomplete study 12/13/15 Enterscopy: no AVMs/normal study to examined portion of jejunum.   Home meds include Protonix 40 mg BID  [place], dark/black stools started 5/20.  Previously brown stools. No increase stool frequency.  Nausea started a few days before that. Became progressively weaker and dizzy/presyncopal with minor activity.  No abd or chest pain.  Appetite good until last week. Admit 5/19 with weakness, presyncope.  Hgb 8.6, down form 10.4.  FOBT +.  Transfused 2 PRBCs to Hgb  Hgb today 7.8.  Weighed 218# at [facility] ~ 1 week ago, 213# on 5/20, current weight is 209#. Last Eliquis was 5/21 PM.        Past Medical History  Diagnosis Date  . Ischemic cardiomyopathy      CABG 2003, PCI 2007  EF 27%(myoview 2012  . Chronic systolic heart failure ([place])   . Hyperlipidemia   . Hypothyroidism   . Chronic anticoagulation     Afib and LVAD  . Obesity   . COPD (chronic obstructive pulmonary disease) ([place])   . Asbestosis(501)     "6 years in the [place]" (05/24/2013)  . Atrial fibrillation ([place])     permanent  . Paroxysmal ventricular tachycardia ([place])   . Coronary artery disease   . Hypertension   . Depression   . LVAD (left ventricular assist device) present ([place]) 12/2012  . Epistaxis 11/2015, 07/2014  . Cancer ([place])     "  scraped some off behind my left ear; fast moving; having it cut out 12/05/2015" (11/07/2015)  . CHF (congestive heart failure) ([place])   . Myocardial infarction ([place]) 1990's-2000    "2 in  ~ the 1990's; 1 in ~ 2000" (11/07/2015)  . On home oxygen therapy     "have it available; don't use it" (11/07/2015)  . OSA on CPAP   . Anemia   . History of blood transfusion 08/2015 X 10; 11/2015 X 2    "related to bleeding on the inside somewhere" (11/07/2015)  . Complication of anesthesia     "they can't put me all the way to sleep cause of my heart" (11/07/2015)    Past Surgical History  Procedure Laterality Date  . Coronary artery bypass graft  2003    "?X4" (05/24/2013)  . Cardiac  defibrillator placement  2004; ~ 2010    "cut it out in 2016 after it got infected"   . Insertion of implantable left ventricular assist device N/A 01/12/2013    Procedure: INSERTION OF IMPLANTABLE LEFT VENTRICULAR ASSIST DEVICE;  Surgeon: Ivin Poot, MD;  Location: [facility] [facility];  Service: Open Heart Surgery;  Laterality: N/A;   nitric oxide; Redo sternotomy  . Intraoperative transesophageal echocardiogram N/A 01/12/2013    Procedure: INTRAOPERATIVE TRANSESOPHAGEAL ECHOCARDIOGRAM;  Surgeon: Ivin Poot, MD;  Location: [place];  Service: Open Heart Surgery;  Laterality: N/A;  . Colonoscopy N/A 03/09/2014    Procedure: COLONOSCOPY;  Surgeon: Inda Castle, MD;  Location: [place];  Service: Endoscopy;  Laterality: N/A;  LVAD  patient  . Esophagogastroduodenoscopy N/A 03/09/2014    Procedure: ESOPHAGOGASTRODUODENOSCOPY (EGD);  Surgeon: Inda Castle, MD;  Location: [place];  Service: Endoscopy;  Laterality: N/A;  . Givens capsule study N/A 03/10/2014    Procedure: GIVENS CAPSULE STUDY;  Surgeon: Inda Castle, MD;  Location: [place];  Service: Endoscopy;  Laterality: N/A;  . Enteroscopy N/A 07/27/2014    Procedure: ENTEROSCOPY;  Surgeon: Gatha Mayer, MD;  Location: [place];  Service: Endoscopy;  Laterality: N/A;  LVAD patient   . Colonoscopy N/A 07/29/2014    Procedure: COLONOSCOPY;  Surgeon: Gatha Mayer, MD;  Location: [place];  Service: Endoscopy;  Laterality: N/A;  . Givens capsule study N/A 07/30/2014    Procedure: GIVENS CAPSULE STUDY;  Surgeon: Gatha Mayer, MD;  Location: [place];  Service: Endoscopy;  Laterality: N/A;  . Esophagogastroduodenoscopy N/A 08/01/2014    Procedure: ESOPHAGOGASTRODUODENOSCOPY (EGD);  Surgeon: Jerene Bears, MD;  Location: [facility] ENDOSCOPY;  Service: Endoscopy;  Laterality: N/A;  LVAD patient  . Left and right heart catheterization with coronary/graft angiogram  01/07/2013    Procedure: LEFT AND RIGHT HEART CATHETERIZATION WITH  Beatrix Fetters;  Surgeon: Jolaine Artist, MD;  Location: [facility] CATH LAB;  Service: Cardiovascular;;  . Enteroscopy N/A 09/27/2014    Procedure: ENTEROSCOPY;  Surgeon: Gatha Mayer, MD;  Location: [facility] ENDOSCOPY;  Service: Endoscopy;  Laterality: N/A;  . Icd lead removal Left 03/16/2015    Procedure: ICD LEAD REMOVAL/EXTRACTION;  Surgeon: Evans Lance, MD;  Location: [place];  Service: Cardiovascular;  Laterality: Left;  PATIENT HAS LVAD  DR. VAN TRIGT TO BACK UP EXTRACTION  . Appendectomy    . Enteroscopy N/A 11/09/2015    Procedure: ENTEROSCOPY;  Surgeon: Mauri Pole, MD;  Location: [facility] ENDOSCOPY;  Service: Endoscopy;  Laterality: N/A;  LVAD  . Givens capsule study N/A 12/11/2015    Procedure: GIVENS CAPSULE STUDY;  Surgeon: Glendell Docker  Simonne Maffucci, MD;  Location: [place];  Service: Endoscopy;  Laterality: N/A;  . Enteroscopy N/A 12/13/2015    Procedure: ENTEROSCOPY;  Surgeon: Milus Banister, MD;  Location: [place];  Service: Endoscopy;  Laterality: N/A;    Prior to Admission medications   Medication Sig Start Date End Date Taking? Authorizing Provider  albuterol (PROVENTIL) (2.5 MG/3ML) 0.083% nebulizer solution Take 2.5 mg by nebulization every 6 (six) hours as needed for wheezing or shortness of breath.   Yes Historical Provider, MD  amiodarone (PACERONE) 200 MG tablet Take 400 mg (2 tablets) twice daily until 12/17/15, then decrease back to 400 mg (2 tablets) daily. Patient taking differently: Take 200 mg by mouth 2 (two) times daily. Take 400 mg (2 tablets) twice daily until 12/17/15, then decrease back to 400 mg (2 tablets) daily. 12/14/15  Yes Shirley Friar, PA-C  apixaban (ELIQUIS) 5 MG TABS tablet Take 1 tablet (5 mg total) by mouth 2 (two) times daily. 12/25/15  Yes Jolaine Artist, MD  budesonide-formoterol [facility]) 160-4.5 MCG/ACT inhaler Inhale 2 puffs into the lungs 2 (two) times daily. 01/10/15  Yes Jolaine Artist, MD  cholecalciferol (VITAMIN D)  1000 UNITS tablet Take 1 tablet (1,000 Units total) by mouth 2 (two) times daily. 01/10/15  Yes Jolaine Artist, MD  citalopram (CELEXA) 40 MG tablet Take 1 tablet (40 mg total) by mouth at bedtime. 01/10/15  Yes Jolaine Artist, MD  docusate sodium (COLACE) 100 MG capsule Take 200 mg by mouth 2 (two) times daily.   Yes Historical Provider, MD  furosemide (LASIX) 40 MG tablet Take 40 mg by mouth daily as needed (for weight more than 225 lbs). Reported on 12/21/2015   Yes Historical Provider, MD  hydrocortisone (ANUSOL-HC) 2.5 % rectal cream Place 1 application rectally 2 (two) times daily. Patient taking differently: Place 1 application rectally 2 (two) times daily as needed for hemorrhoids.  11/21/15  Yes Larey Dresser, MD  levothyroxine (SYNTHROID, LEVOTHROID) 50 MCG tablet Take 50 mcg by mouth at bedtime.   Yes Historical Provider, MD  losartan (COZAAR) 25 MG tablet Take 1 tablet (25 mg total) by mouth daily. 01/23/16  Yes Jolaine Artist, MD  Multiple Vitamin (MULTIVITAMIN WITH MINERALS) TABS tablet Take 1 tablet by mouth daily.   Yes Historical Provider, MD  nitroGLYCERIN (NITROSTAT) 0.4 MG SL tablet Place 0.4 mg under the tongue every 5 (five) minutes as needed for chest pain. Reported on 01/23/2016   Yes Historical Provider, MD  pantoprazole (PROTONIX) 40 MG tablet Take 1 tablet (40 mg total) by mouth 2 (two) times daily. 01/10/15  Yes Shaune Pascal Bensimhon, MD  potassium chloride SA (K-DUR,KLOR-CON) 20 MEQ tablet Take 40 mEq by mouth daily as needed (with furosemide dose).   Yes Historical Provider, MD  PRESCRIPTION MEDICATION Iron infusion at cancer center - last infustion 1st week of May   Yes Historical Provider, MD  simvastatin (ZOCOR) 80 MG tablet Take 0.5 tablets (40 mg total) by mouth at bedtime. 01/10/15  Yes Jolaine Artist, MD  tiotropium (SPIRIVA) 18 MCG inhalation capsule Place 1 capsule (18 mcg total) into inhaler and inhale daily. 01/10/15  Yes Jolaine Artist, MD  vitamin  B-12 (CYANOCOBALAMIN) 1000 MCG tablet Take 1,000 mcg by mouth at bedtime.   Yes Historical Provider, MD  vitamin C (ASCORBIC ACID) 500 MG tablet Take 1 tablet (500 mg total) by mouth 2 (two) times daily. 01/10/15  Yes Jolaine Artist, MD  Scheduled Meds: . sodium chloride   Intravenous Once  . amiodarone  400 mg Oral BID  . atorvastatin  40 mg Oral q1800  . cholecalciferol  1,000 Units Oral BID  . citalopram  40 mg Oral QHS  . docusate sodium  200 mg Oral BID  . hydrocortisone  1 application Rectal BID  . levothyroxine  100 mcg Oral QHS  . losartan  25 mg Oral Daily  . mometasone-formoterol  2 puff Inhalation BID  . octreotide  100 mcg Subcutaneous TID  . pantoprazole  40 mg Oral BID  . tiotropium  18 mcg Inhalation Daily  . vitamin B-12  1,000 mcg Oral QHS  . vitamin C  500 mg Oral BID   Infusions:   PRN Meds: bisacodyl **OR** bisacodyl, furosemide, ipratropium-albuterol, nitroGLYCERIN, ondansetron, potassium chloride   Allergies as of 02/16/2016  . (No Known Allergies)    Family History  Problem Relation Age of Onset  . Heart attack Mother   . Heart attack Father     Social History   Social History  . Marital Status: Married    Spouse Name: N/A  . Number of Children: N/A  . Years of Education: N/A   Occupational History  . Not on file.   Social History Main Topics  . Smoking status: Former Smoker -- 2.00 packs/day for 45 years    Types: Cigarettes    Quit date: 11/11/2001  . Smokeless tobacco: Never Used  . Alcohol Use: Yes     Comment: 05/24/2013 "use to drink beer; hardly nothing since 2003; nothing at all since 12/2011 when I got my LVAD  "  . Drug Use: No  . Sexual Activity: No   Other Topics Concern  . Not on file   Social History Narrative    REVIEW OF SYSTEMS: Constitutional:  Per HPI ENT:  No nose bleeds Pulm:  SOB DOE per HPI CV:  No palpitations, no LE edema.  GU:  No hematuria, no frequency GI:  Per HPI.  No pyrosis,  No dysphagia.   Heme:  Per HPI   Transfusions:  Per HPI.   Neuro:  No headaches, no peripheral tingling or numbness Derm:  No itching, no rash or sores.  Endocrine:  No sweats or chills.  No polyuria or dysuria Immunization:  Reviewed.  Travel:  None beyond local counties in last few months.    PHYSICAL EXAM: Vital signs in last 24 hours: Filed Vitals:   02/19/16 0000 02/19/16 0800  BP:    Pulse:    Temp: 98 F (36.7 C) 98.7 F (37.1 C)  Resp:     Wt Readings from Last 3 Encounters:  02/19/16 95 kg (209 lb 7 oz)  01/23/16 102.331 kg (225 lb 9.6 oz)  01/22/16 101.742 kg (224 lb 4.8 oz)    General: pleasant, somewhat chronically ill looking WM.  Coloring is ashen Head:  No swelling or sign of trauma.  Symmetric face.   Eyes:  No icterus or conj pallor Ears:  Slightly HOH  Nose:  No congestion or discharge Mouth:  Clear, moist, no blood, no lesions Neck:  No JVD or swelling Lungs:  Clear bil.  No cough or dyspnea. Heart: VAD hum Abdomen:  VAD site in RLQ benign.  NT.  ND.  No HSM or bruits..   Rectal: deferred   Musc/Skeltl: no joint contractures, swelling or erythema Extremities:  No CCE.    Neurologic:  Oriented x 3.  Alert. Moves all 4s.  No  tremor Skin:  No large hematomas or open sores Tattoos:  Some on arms   Psych:  Pleasant, calm.  In good spirits.   LAB RESULTS:  Recent Labs  02/18/16 0329 02/18/16 2332 02/19/16 0318  WBC 6.9 7.2 8.0  HGB 8.2* 7.5* 7.8*  HCT 28.0* 26.4* 26.6*  PLT 146* 149* 171   BMET Lab Results  Component Value Date   NA 139 02/19/2016   NA 138 02/18/2016   NA 137 02/16/2016   K 4.0 02/19/2016   K 4.2 02/18/2016   K 4.2 02/16/2016   CL 108 02/19/2016   CL 105 02/18/2016   CL 106 02/16/2016   CO2 28 02/19/2016   CO2 23 02/18/2016   CO2 23 02/16/2016   GLUCOSE 119* 02/19/2016   GLUCOSE 123* 02/18/2016   GLUCOSE 108* 02/16/2016   BUN 26* 02/19/2016   BUN 27* 02/18/2016   BUN 26* 02/16/2016   CREATININE 0.99 02/19/2016   CREATININE  0.94 02/18/2016   CREATININE 1.07 02/16/2016   CALCIUM 9.4 02/19/2016   CALCIUM 9.2 02/18/2016   CALCIUM 9.7 02/16/2016   LFT No results for input(s): PROT, ALBUMIN, AST, ALT, ALKPHOS, BILITOT, BILIDIR, IBILI in the last 72 hours. PT/INR Lab Results  Component Value Date   INR 1.51* 02/19/2016   INR 1.45 02/18/2016   INR 1.30 02/17/2016   ENDOSCOPIC STUDIES: Per HPI  IMPRESSION:   *  Recurrent GIB and blood loss anemia.  11 endoscopic and pill camera studies in the past 2 years. These revealed SB, gastric AVMs, GE Jx erosions. CT showed cirrhosis as far back as 2014 but no portal gastropathy or varices noted on subsequent endoscopies.    S/p PRBCs x 2  *  Chronic Eliquis for A fib.    *  Ischemic CM.  LVAD since 2014.      PLAN:     *  Per Dr Ardis Hughs. If done, can not scope until at least 5/23 as he took Eliquis 2200 yesterday.    Azucena Freed  02/19/2016, 9:07 AM Pager: [phone]  ________________________________________________________________________  Velora Heckler GI MD note:  I personally examined the patient, reviewed the data and agree with the assessment and plan described above.  He has had 11 endoscopic procedures, capsule studies in the past 2 years.  The most recent were 2-3 months ago.  I suspect he is having bleeding from somewhere in his small bowel again.  The utility of further GI testing, procedures is not clear. Are there options other anticoagulation, less anticoagulation?  Can he be on chronic octreotide at home?  For now, will observe off elquis.  OK for him to eat today.  Will consider endoscopic procedure on Wednesday pending his clinical course before then, that will allow for washout of the eliquis (last dose yesterday evening).    Owens Loffler, MD [facility] Pager [phone]

## 2016-02-19 NOTE — Progress Notes (Signed)
  Amiodarone Drug - Drug Interaction Consult Note  Recommendations: Monitor for now.  Amiodarone is metabolized by the cytochrome P450 system and therefore has the potential to cause many drug interactions. Amiodarone has an average plasma half-life of 50 days (range 20 to 100 days).   There is potential for drug interactions to occur several weeks or months after stopping treatment and the onset of drug interactions may be slow after initiating amiodarone.   [x]  Statins: Increased risk of myopathy. Simvastatin- restrict dose to 20mg  daily. Other statins: counsel patients to report any muscle pain or weakness immediately.  []  Anticoagulants: Amiodarone can increase anticoagulant effect. Consider warfarin dose reduction. Patients should be monitored closely and the dose of anticoagulant altered accordingly, remembering that amiodarone levels take several weeks to stabilize.  []  Antiepileptics: Amiodarone can increase plasma concentration of phenytoin, the dose should be reduced. Note that small changes in phenytoin dose can result in large changes in levels. Monitor patient and counsel on signs of toxicity.  []  Beta blockers: increased risk of bradycardia, AV block and myocardial depression. Sotalol - avoid concomitant use.  []   Calcium channel blockers (diltiazem and verapamil): increased risk of bradycardia, AV block and myocardial depression.  []   Cyclosporine: Amiodarone increases levels of cyclosporine. Reduced dose of cyclosporine is recommended.  []  Digoxin dose should be halved when amiodarone is started.  [x]  Diuretics: increased risk of cardiotoxicity if hypokalemia occurs.  []  Oral hypoglycemic agents (glyburide, glipizide, glimepiride): increased risk of hypoglycemia. Patient's glucose levels should be monitored closely when initiating amiodarone therapy.   []  Drugs that prolong the QT interval:  Torsades de pointes risk may be increased with concurrent use - avoid if possible.   Monitor QTc, also keep magnesium/potassium WNL if concurrent therapy can't be avoided. [name] Antibiotics: e.g. fluoroquinolones, erythromycin. . Antiarrhythmics: e.g. quinidine, procainamide, disopyramide, sotalol. . Antipsychotics: e.g. phenothiazines, haloperidol.  . Lithium, tricyclic antidepressants, and methadone. Thank You,  Pat Patrick  02/19/2016 2:58 PM

## 2016-02-19 NOTE — Telephone Encounter (Signed)
Received call from wife requesting to cancel appt for Wed 02/21/16 due to pt is currently in [facility].  Dr. Burr Medico notified.

## 2016-02-19 NOTE — Progress Notes (Addendum)
HeartMate 2 Rounding Note  Subjective:     Received 2u RBCs on admit. 3-4 black BMs yesterday. Hgb 7.8  Getting octerotide.   Overall feels bad. Complaining of dizziness and nausea.    LVAD INTERROGATION:  HeartMate II LVAD: Flow 7.5  liters/min, speed 9200, power 7.0, PI 4.6   Objective:    Vital Signs:   Temp:  [97.6 F (36.4 C)-98.7 F (37.1 C)] 98.7 F (37.1 C) (05/22 0800) Resp:  [20-22] 22 (05/21 1600) SpO2:  [87 %-99 %] 97 % (05/22 0800) Weight:  [209 lb 7 oz (95 kg)] 209 lb 7 oz (95 kg) (05/22 0500) Last BM Date: 02/18/16 Mean arterial Pressure 70s   Intake/Output:   Intake/Output Summary (Last 24 hours) at 02/19/16 0839 Last data filed at 02/19/16 0400  Gross per 24 hour  Intake    780 ml  Output    800 ml  Net    -20 ml      Physical Exam: General: lying in bed. No resp difficulty HEENT: normal Neck: supple. JVP flat . Carotids 2+ bilat; no bruits. No lymphadenopathy or thryomegaly appreciated. Cor: Mechanical heart sounds with LVAD hum present. Lungs: clear Abdomen: soft, nontender, nondistended. No hepatosplenomegaly. No bruits or masses. Good bowel sounds. Driveline: C/D/I; securement device intact and driveline incorporated Extremities: no cyanosis, clubbing, rash, edema Neuro: alert & orientedx3, cranial nerves grossly intact. moves all 4 extremities w/o difficulty. Affect pleasant  Telemetry: NSR with PVCs  Labs: Basic Metabolic Panel:  Recent Labs Lab 02/16/16 1320 02/18/16 0329 02/19/16 0318  NA 137 138 139  K 4.2 4.2 4.0  CL 106 105 108  CO2 23 23 28   GLUCOSE 108* 123* 119*  BUN 26* 27* 26*  CREATININE 1.07 0.94 0.99  CALCIUM 9.7 9.2 9.4    Liver Function Tests: No results for input(s): AST, ALT, ALKPHOS, BILITOT, PROT, ALBUMIN in the last 168 hours. No results for input(s): LIPASE, AMYLASE in the last 168 hours. No results for input(s): AMMONIA in the last 168 hours.  CBC:  Recent Labs Lab 02/16/16 2213  02/17/16 0722 02/18/16 0329 02/18/16 2332 02/19/16 0318  WBC 6.2 6.3 6.9 7.2 8.0  HGB 8.1* 8.6* 8.2* 7.5* 7.8*  HCT 27.6* 28.9* 28.0* 26.4* 26.6*  MCV 95.5 95.4 97.9 98.9 97.8  PLT 148* 145* 146* 149* 171    INR:  Recent Labs Lab 02/16/16 2213 02/17/16 0722 02/18/16 0329 02/19/16 0318  INR 1.44 1.30 1.45 1.51*    Other results:    Imaging: No results found.   Medications:     Scheduled Medications: . amiodarone  400 mg Oral BID  . apixaban  5 mg Oral BID  . atorvastatin  40 mg Oral q1800  . cholecalciferol  1,000 Units Oral BID  . citalopram  40 mg Oral QHS  . docusate sodium  200 mg Oral BID  . hydrocortisone  1 application Rectal BID  . levothyroxine  100 mcg Oral QHS  . losartan  25 mg Oral Daily  . mometasone-formoterol  2 puff Inhalation BID  . octreotide  100 mcg Subcutaneous TID  . pantoprazole  40 mg Oral BID  . tiotropium  18 mcg Inhalation Daily  . vitamin B-12  1,000 mcg Oral QHS  . vitamin C  500 mg Oral BID    Infusions:    PRN Medications: bisacodyl **OR** bisacodyl, furosemide, ipratropium-albuterol, nitroGLYCERIN, ondansetron, potassium chloride   Assessment:   1. Symptomatic Anemia 2. Recurrent GIB  -- H/O GI bleed.  --  Enteroscopy 12/15 showed bleed from jejunum requiring 3 clips  -- Enteroscopy 2/17 Normal  -- Capsule 12/11/15 2 proximal small bowel AVMs, 2 polyps - "terrible prep"  -- Enteroscopy 12/13/15 Normal, No AVMs or residual blood found. 3. Recurrent AF now back in NSR 4. Chronic Systolic HF: S/P LVAD for DT 12/2012  5. OSA 6. NSVT 7. Hypotension   Plan/Discussion:    Admitted with recurrent GI bleed and presyncope due to hypotension. Also recurrent AF. BP improved with volume support.   S/P 2 units.  RBCs. Hgb 7.8.  Give 1 UPRBCs. Ongoing melena.  Stop apixaban.  Continue octreotide. Continue protonix. Will keep NPO. Recent endoscopy and capsule relatively unremarkable (3/17).   In and out of a  fib, NSR this morning.    MAPs 70s.  VAD parameters ok. LDH low.   I reviewed the LVAD parameters from today, and compared the results to the patient's prior recorded data.  No programming changes were made.  The LVAD is functioning within specified parameters.  The patient performs LVAD self-test daily.  LVAD interrogation was negative for any significant power changes, alarms or PI events/speed drops.  LVAD equipment check completed and is in good working order.  Back-up equipment present.   LVAD education done on emergency procedures and precautions and reviewed exit site care.  Length of Stay: 3  Amy Clegg NP-C  02/19/2016, 8:39 AM  VAD Team --- VAD ISSUES ONLY--- Pager [phone] (7am - 7am)  Advanced Heart Failure Team  Pager [phone] (M-F; 7a - 4p)  Please contact [facility] for night-coverage after hours (4p -7a ) and weekends on [email or web address]  Patient seen with NP, agree with the above note.  He had an episode of melena last night.  Hemoglobin 7.5.  Suspect AVM bleeding. - Continue octreotide.  - Would transfuse 2 units PRBCs today (symptomatic, legs "feel like jelly").  - GI consult now, would hold apixaban today for possible enteroscopy.   He remains in NSR this morning, continue amiodarone.   I reviewed telemetry: Has runs of wide complex tachy.  Very irregular but also very different morphology than sinus rhythm.  Will have EP take a look, ?afib with aberrancy versus NSVT.   Loralie Champagne 02/19/2016 9:03 AM  Plan discussed with Dr Ardis Hughs.  Will hold apixaban for now and plan enteroscopy on Wednesday to look for/treat possible AVM.  Will afterwards restart apixaban and will try to arrange for long-term octreotide.   Loralie Champagne 02/19/2016 6:13 PM

## 2016-02-19 NOTE — Progress Notes (Signed)
   Rhythm reviewed by Dr Taylor--> NSVT.    Stop po amio and start amio drip. K ok. Check mag level.   Bekah Igoe  NP-C   9:25 AM

## 2016-02-19 NOTE — Telephone Encounter (Signed)
Lab apt canceled per pt wife, pt admitted to hospital

## 2016-02-19 NOTE — Progress Notes (Signed)
Patient OOB to bathroom for another BM. Advised patient to use hat in commode to catch stool to send specimen to lab. Patient stated he would. After several minutes I heard commode flushing and I asked patient if he felt ok, he responded yes. He opened the bathroom door and he was sitting on the [facility] bedside the sink. I had very little in the specimen container and he stated that was enough to send down. Specimen was dark bloody bubble, not enough for sample C-Diff. Patient stated he felt really bad and was assisted back to bed. Changed LVAD from battery to wall unit and advised patient not to get OOB without assistance. MAP was 84 and HR 87 Afib with PVC's. Ordered CBC to check his HGB and placed him on O2 at 3L/[place]. Lab came to draw blood and HGB resulted 7.5. Dr. Wynonia Lawman called and he stated we were to call the VAD Coordinator for these patients. Cloyde Reams was on call and she stated to call Dr. Haroldine Laws. Paged Dr. Haroldine Laws and waiting for callback. Will continue to monitor closely.

## 2016-02-20 DIAGNOSIS — D649 Anemia, unspecified: Secondary | ICD-10-CM

## 2016-02-20 DIAGNOSIS — K921 Melena: Secondary | ICD-10-CM

## 2016-02-20 LAB — IRON AND TIBC
Iron: 21 ug/dL — ABNORMAL LOW (ref 45–182)
Saturation Ratios: 6 % — ABNORMAL LOW (ref 17.9–39.5)
TIBC: 323 ug/dL (ref 250–450)
UIBC: 302 ug/dL

## 2016-02-20 LAB — BASIC METABOLIC PANEL
ANION GAP: 5 (ref 5–15)
Anion gap: 5 (ref 5–15)
BUN: 26 mg/dL — AB (ref 6–20)
BUN: 27 mg/dL — ABNORMAL HIGH (ref 6–20)
CALCIUM: 9.1 mg/dL (ref 8.9–10.3)
CHLORIDE: 105 mmol/L (ref 101–111)
CO2: 26 mmol/L (ref 22–32)
CO2: 27 mmol/L (ref 22–32)
CREATININE: 0.94 mg/dL (ref 0.61–1.24)
Calcium: 9.3 mg/dL (ref 8.9–10.3)
Chloride: 106 mmol/L (ref 101–111)
Creatinine, Ser: 1.98 mg/dL — ABNORMAL HIGH (ref 0.61–1.24)
GFR calc Af Amer: 37 mL/min — ABNORMAL LOW (ref >60)
GFR calc non Af Amer: >60 mL/min (ref >60)
GFR, EST NON AFRICAN AMERICAN: 32 mL/min — AB (ref >60)
GLUCOSE: 139 mg/dL — AB (ref 65–99)
Glucose, Bld: 124 mg/dL — ABNORMAL HIGH (ref 65–99)
POTASSIUM: 3.9 mmol/L (ref 3.5–5.1)
Potassium: 3.9 mmol/L (ref 3.5–5.1)
SODIUM: 137 mmol/L (ref 135–145)
SODIUM: 137 mmol/L (ref 135–145)

## 2016-02-20 LAB — CBC
HCT: 27.2 % — ABNORMAL LOW (ref 39.0–52.0)
Hemoglobin: 8.1 g/dL — ABNORMAL LOW (ref 13.0–17.0)
MCH: 28.7 pg (ref 26.0–34.0)
MCHC: 29.8 g/dL — ABNORMAL LOW (ref 30.0–36.0)
MCV: 96.5 fL (ref 78.0–100.0)
PLATELETS: 137 10*3/uL — AB (ref 150–400)
RBC: 2.82 MIL/uL — AB (ref 4.22–5.81)
RDW: 21.7 % — AB (ref 11.5–15.5)
WBC: 8 10*3/uL (ref 4.0–10.5)

## 2016-02-20 LAB — TYPE AND SCREEN
ABO/RH(D): O POS
ANTIBODY SCREEN: NEGATIVE
UNIT DIVISION: 0
UNIT DIVISION: 0
UNIT DIVISION: 0
Unit division: 0

## 2016-02-20 LAB — LACTATE DEHYDROGENASE: LDH: 198 U/L — ABNORMAL HIGH (ref 98–192)

## 2016-02-20 LAB — FERRITIN: Ferritin: 35 ng/mL (ref 24–336)

## 2016-02-20 LAB — MAGNESIUM: MAGNESIUM: 1.7 mg/dL (ref 1.7–2.4)

## 2016-02-20 MED ORDER — MAGNESIUM SULFATE 2 GM/50ML IV SOLN
2.0000 g | Freq: Once | INTRAVENOUS | Status: AC
  Administered 2016-02-20: 2 g via INTRAVENOUS
  Filled 2016-02-20: qty 50

## 2016-02-20 NOTE — Progress Notes (Signed)
Pt had small black tarry stool.  With bright red blood in BSC.  Pt stated "  My Hemorrhoid is acting up"   Medication given.  Will continue to monitor [name]

## 2016-02-20 NOTE — Progress Notes (Signed)
Daily Rounding Note  02/20/2016, 8:23 AM  LOS: 4 days   SUBJECTIVE:       Pt reports 2 black stools yesterday.  Self reported black stool ~ 4 AM today.  Pt flushed this, so not confirmed by staff. Some dizziness with position changes.  Otherwise feels well.  OBJECTIVE:         Vital signs in last 24 hours:    Temp:  [97.4 F (36.3 C)-98.8 F (37.1 C)] 97.4 F (36.3 C) (05/23 0407) Resp:  [15-20] 16 (05/23 0407) SpO2:  [92 %-98 %] 92 % (05/23 0804) Weight:  [96.253 kg (212 lb 3.2 oz)] 96.253 kg (212 lb 3.2 oz) (05/23 0407) Last BM Date: 02/19/16 Filed Weights   02/18/16 0400 02/19/16 0500 02/20/16 0407  Weight: 96.1 kg (211 lb 13.8 oz) 95 kg (209 lb 7 oz) 96.253 kg (212 lb 3.2 oz)   General: looks the same, chronically ill but no distress   Heart: VAD hum.  Chest: clear bil.  No labored resps or cough.  Abdomen: soft, NT, ND.  Good bowel sounds  Extremities: no CCE Neuro/Psych:  Oriented x 3.  No tremor, no limb weakness.  Calm and alert.   Intake/Output from previous day: 05/22 0701 - 05/23 0700 In: 1669.6 [P.O.:600; I.V.:493.6; Blood:576] Out: 750 [Urine:750]  Intake/Output this shift:    Lab Results:  Recent Labs  02/18/16 2332 02/19/16 0318 02/20/16 0231  WBC 7.2 8.0 8.0  HGB 7.5* 7.8* 8.1*  HCT 26.4* 26.6* 27.2*  PLT 149* 171 137*   BMET  Recent Labs  02/18/16 0329 02/19/16 0318 02/20/16 0231  NA 138 139 137  K 4.2 4.0 3.9  CL 105 108 106  CO2 23 28 26   GLUCOSE 123* 119* 139*  BUN 27* 26* 26*  CREATININE 0.94 0.99 1.98*  CALCIUM 9.2 9.4 9.1   LFT No results for input(s): PROT, ALBUMIN, AST, ALT, ALKPHOS, BILITOT, BILIDIR, IBILI in the last 72 hours. PT/INR  Recent Labs  02/18/16 0329 02/19/16 0318  LABPROT 17.7* 18.3*  INR 1.45 1.51*   Hepatitis Panel No results for input(s): HEPBSAG, HCVAB, HEPAIGM, HEPBIGM in the last 72 hours.  Studies/Results: No results found.    Scheduled Meds: . atorvastatin  40 mg Oral q1800  . cholecalciferol  1,000 Units Oral BID  . citalopram  40 mg Oral QHS  . docusate sodium  200 mg Oral BID  . hydrocortisone  1 application Rectal BID  . levothyroxine  100 mcg Oral QHS  . losartan  25 mg Oral Daily  . mometasone-formoterol  2 puff Inhalation BID  . octreotide  100 mcg Subcutaneous TID  . pantoprazole  40 mg Oral BID  . tiotropium  18 mcg Inhalation Daily  . vitamin B-12  1,000 mcg Oral QHS  . vitamin C  500 mg Oral BID   Continuous Infusions: . amiodarone 30 mg/hr (02/19/16 2336)   PRN Meds:.bisacodyl **OR** bisacodyl, furosemide, ipratropium-albuterol, nitroGLYCERIN, ondansetron, potassium chloride   ASSESMENT:   * Recurrent GIB and blood loss anemia. 11 endoscopic and pill camera studies in the past 2 years. These revealed SB, gastric AVMs, GE Jx erosions. CT confirmed cirrhosis 2014 but no portal gastropathy or varices noted on subsequent endoscopies.  S/p PRBCs x 4.  Started SQ Octreotide this admission. , day 5.   * Chronic Eliquis for A fib. On hold, last dose 5/22 PM.   * Ischemic CM. LVAD since 2014.  PLAN   *   ? Home octreotide? *  Schedule enteroscopy for 5/24.      Azucena Freed  02/20/2016, 8:23 AM Pager: [phone]   ________________________________________________________________________  Velora Heckler GI MD note:  I personally examined the patient, reviewed the data and agree with the assessment and plan described above.  I discussed his case with Dr. Aundra Dubin yesterday.  Planning on enteroscopy tomorrow, currently scheduled for noon.     Owens Loffler, MD [facility] Pager [phone]

## 2016-02-20 NOTE — Progress Notes (Signed)
Assisted pt to Bathroom.  Pt had small BM. Pt reported dark black stool.  Educated pt to leave hat in toilet that we needed to monitor BM's.  Assisted pt back to bed.  Pt reported felt little dizziness when sat up in bed.  Call bell in reach.  Will continue to monitor. Saunders Revel T

## 2016-02-20 NOTE — Progress Notes (Signed)
HeartMate 2 Rounding Note  Subjective:     Getting octerotide. Overall he has received 3 UPRBCs. Hgb 8.1   Overall feels bad. No change from yesterday. Complaining of dizziness and nausea.    LVAD INTERROGATION:  HeartMate II LVAD: Flow 4.7  liters/min, speed 9200, power 5, PI 4.6 < 10 PI events.    Objective:    Vital Signs:   Temp:  [97.4 F (36.3 C)-98.8 F (37.1 C)] 97.4 F (36.3 C) (05/23 0407) Resp:  [15-20] 16 (05/23 0407) SpO2:  [92 %-98 %] 92 % (05/23 0804) Weight:  [212 lb 3.2 oz (96.253 kg)] 212 lb 3.2 oz (96.253 kg) (05/23 0407) Last BM Date: 02/19/16 Mean arterial Pressure 70s   Intake/Output:   Intake/Output Summary (Last 24 hours) at 02/20/16 0826 Last data filed at 02/20/16 0600  Gross per 24 hour  Intake 1669.57 ml  Output    750 ml  Net 919.57 ml      Physical Exam: General: lying in bed. No resp difficulty HEENT: normal Neck: supple. JVP flat . Carotids 2+ bilat; no bruits. No lymphadenopathy or thryomegaly appreciated. Cor: Mechanical heart sounds with LVAD hum present. Lungs: clear Abdomen: soft, nontender, nondistended. No hepatosplenomegaly. No bruits or masses. Good bowel sounds. Driveline: C/D/I; securement device intact and driveline incorporated Extremities: no cyanosis, clubbing, rash, edema Neuro: alert & orientedx3, cranial nerves grossly intact. moves all 4 extremities w/o difficulty. Affect pleasant  Telemetry: NSR with PVCs  Labs: Basic Metabolic Panel:  Recent Labs Lab 02/16/16 1320 02/18/16 0329 02/19/16 0318 02/19/16 1841 02/20/16 0231  NA 137 138 139  --  137  K 4.2 4.2 4.0  --  3.9  CL 106 105 108  --  106  CO2 23 23 28   --  26  GLUCOSE 108* 123* 119*  --  139*  BUN 26* 27* 26*  --  26*  CREATININE 1.07 0.94 0.99  --  1.98*  CALCIUM 9.7 9.2 9.4  --  9.1  MG  --   --   --  1.7 1.7    Liver Function Tests: No results for input(s): AST, ALT, ALKPHOS, BILITOT, PROT, ALBUMIN in the last 168 hours. No  results for input(s): LIPASE, AMYLASE in the last 168 hours. No results for input(s): AMMONIA in the last 168 hours.  CBC:  Recent Labs Lab 02/17/16 0722 02/18/16 0329 02/18/16 2332 02/19/16 0318 02/20/16 0231  WBC 6.3 6.9 7.2 8.0 8.0  HGB 8.6* 8.2* 7.5* 7.8* 8.1*  HCT 28.9* 28.0* 26.4* 26.6* 27.2*  MCV 95.4 97.9 98.9 97.8 96.5  PLT 145* 146* 149* 171 137*    INR:  Recent Labs Lab 02/16/16 2213 02/17/16 0722 02/18/16 0329 02/19/16 0318  INR 1.44 1.30 1.45 1.51*    Other results:    Imaging: No results found.   Medications:     Scheduled Medications: . atorvastatin  40 mg Oral q1800  . cholecalciferol  1,000 Units Oral BID  . citalopram  40 mg Oral QHS  . docusate sodium  200 mg Oral BID  . hydrocortisone  1 application Rectal BID  . levothyroxine  100 mcg Oral QHS  . losartan  25 mg Oral Daily  . mometasone-formoterol  2 puff Inhalation BID  . octreotide  100 mcg Subcutaneous TID  . pantoprazole  40 mg Oral BID  . tiotropium  18 mcg Inhalation Daily  . vitamin B-12  1,000 mcg Oral QHS  . vitamin C  500 mg Oral BID  Infusions: . amiodarone 30 mg/hr (02/19/16 2336)    PRN Medications: bisacodyl **OR** bisacodyl, furosemide, ipratropium-albuterol, nitroGLYCERIN, ondansetron, potassium chloride   Assessment:   1. Symptomatic Anemia 2. Recurrent GIB  -- H/O GI bleed.  -- Enteroscopy 12/15 showed bleed from jejunum requiring 3 clips  -- Enteroscopy 2/17 Normal 4. Chronic Systolic HF: S/P LVAD for DT 12/2012  5. OSA 6. NSVT 7. Hypotension  -- Capsule 12/11/15 2 proximal small bowel AVMs, 2 polyps - "terrible prep"  -- Enteroscopy 12/13/15 Normal, No AVMs or residual blood found. 3. Recurrent A  F now back in NSR   Plan/Discussion:    Admitted with recurrent GI bleed and presyncope due to hypotension. Also recurrent AF. BP improved with volume support.   Creatinine up to 1.98. Repeat now. He has not received diuretics.    S/P 3units.  Hgb 8.1.  Give 1 UPRBCs. Ongoing melena.  Off apixaban.  Planning on enteroscopy tomorrow. Plan to restart apixaban after scope. Continue octreotide. Continue protonix.  Recent endoscopy and capsule relatively unremarkable (3/17).   Ongoing NSVT. On amio drip. Mag 1.7 . Give  2 grams Mag now  MAPs 70-80s.   PI running lower. May need to cut back speed to 90)0. LDH low.   I reviewed the LVAD parameters from today, and compared the results to the patient's prior recorded data.  No programming changes were made.  The LVAD is functioning within specified parameters.  The patient performs LVAD self-test daily.  LVAD interrogation was negative for any significant power changes, alarms or PI events/speed drops.  LVAD equipment check completed and is in good working order.  Back-up equipment present.   LVAD education done on emergency procedures and precautions and reviewed exit site care.  Length of Stay: 4  Amy Clegg NP-C  02/20/2016, 8:26 AM  VAD Team --- VAD ISSUES ONLY--- Pager [phone] (7am - 7am)  Advanced Heart Failure Team  Pager [phone] (M-F; 7a - 4p)  Please contact [facility] for night-coverage after hours (4p -7a ) and weekends on [email or web address]  Patient seen and examined with [name], NP. We discussed all aspects of the encounter. I agree with the assessment and plan as stated above.   Still with melena, received another unit RBCs yesterday. Multiple episodes of NSVT. Mag supped. I turned VAD speed down to 9000. On IV amio. Creatinine up but may be artifactual. Will repeat BMET. Discussed with GI. Possible enteroscopy tomorrow. Continue octreotide.   Bensimhon, Daniel,MD 9:04 AM

## 2016-02-21 ENCOUNTER — Ambulatory Visit: Payer: No Typology Code available for payment source

## 2016-02-21 ENCOUNTER — Encounter (HOSPITAL_COMMUNITY)
Admission: EM | Disposition: A | Discharge status: Home / Self Care | Payer: Self-pay | Source: Home / Self Care | Attending: Internal Medicine

## 2016-02-21 ENCOUNTER — Encounter (HOSPITAL_COMMUNITY): Payer: Self-pay | Admitting: *Deleted

## 2016-02-21 ENCOUNTER — Inpatient Hospital Stay (HOSPITAL_COMMUNITY): Payer: Non-veteran care | Admitting: Certified Registered Nurse Anesthetist

## 2016-02-21 DIAGNOSIS — K5520 Angiodysplasia of colon without hemorrhage: Secondary | ICD-10-CM

## 2016-02-21 DIAGNOSIS — I480 Paroxysmal atrial fibrillation: Secondary | ICD-10-CM

## 2016-02-21 DIAGNOSIS — Z95811 Presence of heart assist device: Secondary | ICD-10-CM

## 2016-02-21 DIAGNOSIS — I472 Ventricular tachycardia: Secondary | ICD-10-CM

## 2016-02-21 HISTORY — PX: ENTEROSCOPY: SHX5533

## 2016-02-21 LAB — CBC
HCT: 25.8 % — ABNORMAL LOW (ref 39.0–52.0)
HEMOGLOBIN: 7.7 g/dL — AB (ref 13.0–17.0)
MCH: 29.3 pg (ref 26.0–34.0)
MCHC: 29.8 g/dL — ABNORMAL LOW (ref 30.0–36.0)
MCV: 98.1 fL (ref 78.0–100.0)
Platelets: 142 10*3/uL — ABNORMAL LOW (ref 150–400)
RBC: 2.63 MIL/uL — AB (ref 4.22–5.81)
RDW: 21.3 % — ABNORMAL HIGH (ref 11.5–15.5)
WBC: 7.7 10*3/uL (ref 4.0–10.5)

## 2016-02-21 LAB — BASIC METABOLIC PANEL
Anion gap: 5 (ref 5–15)
BUN: 22 mg/dL — AB (ref 6–20)
CHLORIDE: 104 mmol/L (ref 101–111)
CO2: 29 mmol/L (ref 22–32)
Calcium: 9.2 mg/dL (ref 8.9–10.3)
Creatinine, Ser: 0.94 mg/dL (ref 0.61–1.24)
GFR calc non Af Amer: >60 mL/min (ref >60)
Glucose, Bld: 129 mg/dL — ABNORMAL HIGH (ref 65–99)
POTASSIUM: 4 mmol/L (ref 3.5–5.1)
SODIUM: 138 mmol/L (ref 135–145)

## 2016-02-21 LAB — PREPARE RBC (CROSSMATCH)

## 2016-02-21 LAB — LACTATE DEHYDROGENASE: LDH: 186 U/L (ref 98–192)

## 2016-02-21 SURGERY — ENTEROSCOPY
Anesthesia: Monitor Anesthesia Care

## 2016-02-21 MED ORDER — LIDOCAINE HCL (CARDIAC) 20 MG/ML IV SOLN
INTRAVENOUS | Status: DC | PRN
  Administered 2016-02-21: 40 mg via INTRAVENOUS

## 2016-02-21 MED ORDER — PROPOFOL 500 MG/50ML IV EMUL
INTRAVENOUS | Status: DC | PRN
  Administered 2016-02-21: 50 ug/kg/min via INTRAVENOUS

## 2016-02-21 MED ORDER — PHENYLEPHRINE HCL 10 MG/ML IJ SOLN
10.0000 mg | INTRAVENOUS | Status: DC | PRN
  Administered 2016-02-21: 30 ug/min via INTRAVENOUS

## 2016-02-21 MED ORDER — SODIUM CHLORIDE 0.9 % IV SOLN
Freq: Once | INTRAVENOUS | Status: AC
  Administered 2016-02-21: 14:00:00 via INTRAVENOUS

## 2016-02-21 MED ORDER — PROPOFOL 10 MG/ML IV BOLUS
INTRAVENOUS | Status: DC | PRN
  Administered 2016-02-21 (×4): 15 mg via INTRAVENOUS

## 2016-02-21 MED ORDER — AMIODARONE HCL 200 MG PO TABS
200.0000 mg | ORAL_TABLET | Freq: Two times a day (BID) | ORAL | Status: DC
  Administered 2016-02-22 – 2016-02-23 (×3): 200 mg via ORAL
  Filled 2016-02-21 (×3): qty 1

## 2016-02-21 MED ORDER — LACTATED RINGERS IV SOLN
INTRAVENOUS | Status: DC | PRN
  Administered 2016-02-21: 13:00:00 via INTRAVENOUS

## 2016-02-21 NOTE — Anesthesia Preprocedure Evaluation (Addendum)
Anesthesia Evaluation  Patient identified by MRN, date of birth, ID band Patient awake    Reviewed: Allergy & Precautions, NPO status , Patient's Chart, lab work & pertinent test results  History of Anesthesia Complications (+) history of anesthetic complications  Airway Mallampati: II  TM Distance: >3 FB Neck ROM: Full    Dental  (+) Edentulous Upper, Edentulous Lower   Pulmonary sleep apnea and Continuous Positive Airway Pressure Ventilation , COPD, former smoker,     + decreased breath sounds      Cardiovascular hypertension, Pt. on medications + CAD, + Past MI and +CHF  + dysrhythmias Atrial Fibrillation and Ventricular Tachycardia  Rhythm:Regular  LVAD 3 yrs ago. Recent history of VT, currently on amiodarone infusion  LVAD hum on auscultation   Neuro/Psych PSYCHIATRIC DISORDERS Depression TIA   GI/Hepatic Neg liver ROS, PUD, GERD  Medicated,  Endo/Other  Hypothyroidism   Renal/GU negative Renal ROS  negative genitourinary   Musculoskeletal negative musculoskeletal ROS (+)   Abdominal (+)  Abdomen: soft.    Peds negative pediatric ROS (+)  Hematology  (+) Blood dyscrasia, anemia ,   Anesthesia Other Findings - LVAD insertion - 2014  Reproductive/Obstetrics negative OB ROS                           Anesthesia Physical Anesthesia Plan  ASA: IV  Anesthesia Plan: MAC   Post-op Pain Management:    Induction: Intravenous  Airway Management Planned: Natural Airway and Simple Face Mask  Additional Equipment:   Intra-op Plan:   Post-operative Plan:   Informed Consent: I have reviewed the patients History and Physical, chart, labs and discussed the procedure including the risks, benefits and alternatives for the proposed anesthesia with the patient or authorized representative who has indicated his/her understanding and acceptance.     Plan Discussed with: CRNA  Anesthesia  Plan Comments: (Coordinate with LVAD RN  PI 5.6)       Anesthesia Quick Evaluation

## 2016-02-21 NOTE — Interval H&P Note (Signed)
History and Physical Interval Note:  02/21/2016 12:25 PM  [name]  has presented today for surgery, with the diagnosis of anemia and gi bleeding in VAD pt.  The various methods of treatment have been discussed with the patient and family. After consideration of risks, benefits and other options for treatment, the patient has consented to  Procedure(s): ENTEROSCOPY (N/A) as a surgical intervention .  The patient's history has been reviewed, patient examined, no change in status, stable for surgery.  I have reviewed the patient's chart and labs.  Questions were answered to the patient's satisfaction.     Milus Banister

## 2016-02-21 NOTE — Op Note (Signed)
[facility] Patient Name: [name] Procedure Date : 02/21/2016 MRN: [id number] Attending MD: Milus Banister , MD Date of Birth: 1941-06-04 CSN: [id number] Age: 75 Admit Type: Inpatient Procedure:                Small bowel enteroscopy Indications:              Melena, recurrent; LVAD patient with multiple                            admissions for GI bleeding, 11 previous endoscopic                            procedures in past 2 years, now with recurrent                            melena, anemia; on eliquis which has been held for                            2 days now. Providers:                Milus Banister, MD, Cleda Daub, RN, Alfonso Patten, Technician Referring MD:              Medicines:                Monitored Anesthesia Care Complications:            No immediate complications. Estimated blood loss:                            Minimal. Estimated Blood Loss:     Estimated blood loss was minimal. Procedure:                Pre-Anesthesia Assessment:                           - Prior to the procedure, a History and Physical                            was performed, and patient medications and                            allergies were reviewed. The patient's tolerance of                            previous anesthesia was also reviewed. The risks                            and benefits of the procedure and the sedation                            options and risks were discussed with the patient.  All questions were answered, and informed consent                            was obtained. Prior Anticoagulants: The patient has                            taken Eliquis (apixaban), last dose was 2 days                            prior to procedure. ASA Grade Assessment: IV - A                            patient with severe systemic disease that is a                            constant threat to life. After reviewing  the risks                            and benefits, the patient was deemed in                            satisfactory condition to undergo the procedure.                           After obtaining informed consent, the endoscope was                            passed under direct vision. Throughout the                            procedure, the patient's blood pressure, pulse, and                            oxygen saturations were monitored continuously. The                            [id number]) scope was introduced through                            the mouth and advanced to the mid-jejunum. The                            small bowel enteroscopy was accomplished without                            difficulty. The patient tolerated the procedure                            well. 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 Fulguration to ablate the lesion by argon plasma was       performed, this caused the lesion to actively ooze blood. To stop active  bleeding, one hemostatic clip was successfully placed. There was no       bleeding at the end of the procedure.      The examination, to mid-distal jejenum, was otherwise normal. 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                            This was treated with application of APC and then                            placement of signle endoclip. There was mild oozing                            from the site following APC application.                           - No specimens collected. Recommendation:           - Return patient to ICU for ongoing care.                           - Restart eliquis per cardiology, I prefer another                            24 hours to allow this site to heal more if                            possible.                           - Ok to resume eating today.                            - Please call or page if any further questions or                            concerns. Procedure Code(s):        --- Professional ---                           [phone], Small intestinal endoscopy, enteroscopy                            beyond second portion of duodenum, not including                            ileum; with ablation of tumor(s), polyp(s), or                            other lesion(s) not amenable to removal by hot                            biopsy forceps, bipolar cautery or snare technique  [phone], 69, Small intestinal endoscopy, enteroscopy                            beyond second portion of duodenum, not including                            ileum; with control of bleeding (eg, injection,                            bipolar cautery, unipolar cautery, laser, heater                            probe, stapler, plasma coagulator) Diagnosis Code(s):        --- Professional ---                           K55.20, Angiodysplasia of colon without hemorrhage                           K92.1, Melena (includes Hematochezia) CPT copyright 2016 [employer]. All rights reserved. The codes documented in this report are preliminary and upon coder review may  be revised to meet current compliance requirements. Milus Banister, MD 02/21/2016 2:02:32 PM This report has been signed electronically. Number of Addenda: 0

## 2016-02-21 NOTE — Care Management Important Message (Signed)
Important Message  Patient Details  Name: [name] MRN: [id number] Date of Birth: May 07, 1941   Medicare Important Message Given:  Yes    Loann Quill 02/21/2016, 11:10 AM

## 2016-02-21 NOTE — Progress Notes (Signed)
          Daily Rounding Note  02/21/2016, 10:13 AM  LOS: 5 days   SUBJECTIVE:       Black, soft, unformed BMs yesterday x 2.  + dizziness.  Denies nausea.  No chest pain or dyspnea.  Eating 100% of recent regular diet meals. Hgb to 7.7, 2 more PRBCs ordered.    OBJECTIVE:         Vital signs in last 24 hours:    Temp:  [97.5 F (36.4 C)-98.7 F (37.1 C)] 98.3 F (36.8 C) (05/24 0739) Pulse Rate:  [54-69] 54 (05/24 0734) Resp:  [16-18] 16 (05/24 0734) BP: (80)/(66) 80/66 mmHg (05/24 0316) SpO2:  [85 %-98 %] 85 % (05/24 1006) Weight:  [96.253 kg (212 lb 3.2 oz)] 96.253 kg (212 lb 3.2 oz) (05/24 0600) Last BM Date: 02/20/16 Filed Weights   02/19/16 0500 02/20/16 0407 02/21/16 0600  Weight: 95 kg (209 lb 7 oz) 96.253 kg (212 lb 3.2 oz) 96.253 kg (212 lb 3.2 oz)   General: pleasant, comfortable, cushingoid but looks well   Heart: VAD hum. Chest: clear bil.  No dyspnea or cough Abdomen: soft, NT.  Protuberant.    Extremities: no CCE Neuro/Psych:  Pleasant, alert, oriented x 3.  No tremor.  Moves all 4s.   Intake/Output from previous day: 05/23 0701 - 05/24 0700 In: 1230.8 [P.O.:830; I.V.:400.8] Out: 1050 [Urine:1050]  Intake/Output this shift:    Lab Results:  Recent Labs  02/19/16 0318 02/20/16 0231 02/21/16 0528  WBC 8.0 8.0 7.7  HGB 7.8* 8.1* 7.7*  HCT 26.6* 27.2* 25.8*  PLT 171 137* 142*   BMET  Recent Labs  02/20/16 0231 02/20/16 0849 02/21/16 0528  NA 137 137 138  K 3.9 3.9 4.0  CL 106 105 104  CO2 26 27 29   GLUCOSE 139* 124* 129*  BUN 26* 27* 22*  CREATININE 1.98* 0.94 0.94  CALCIUM 9.1 9.3 9.2   LFT No results for input(s): PROT, ALBUMIN, AST, ALT, ALKPHOS, BILITOT, BILIDIR, IBILI in the last 72 hours. PT/INR  Recent Labs  02/19/16 0318  LABPROT 18.3*  INR 1.51*   Hepatitis Panel No results for input(s): HEPBSAG, HCVAB, HEPAIGM, HEPBIGM in the last 72 hours.  Studies/Results: No  results found.  ASSESMENT:   * Recurrent GIB and blood loss anemia. 11 endoscopic and pill camera studies in the past 2 years. These revealed SB, gastric AVMs, GE Jx erosions. CT confirmed cirrhosis 2014 but no portal gastropathy or varices noted on subsequent endoscopies.  S/p PRBCs x 4. 2 additional units ordered for today.   Started SQ Octreotide this admission, day 6. Still seems to be bleeding given dropping Hgb and black stools..   * Chronic Eliquis for A fib, LVAD. On hold, last dose 5/22 PM.   * Ischemic CM. LVAD since 2014.     PLAN   *  Enteroscopy today at noon.    [name]  02/21/2016, 10:13 AM Pager: [phone]

## 2016-02-21 NOTE — Anesthesia Procedure Notes (Signed)
Procedure Name: MAC Date/Time: 02/21/2016 1:06 PM Performed by: Jenne Campus Pre-anesthesia Checklist: Patient identified, Emergency Drugs available, Suction available and Patient being monitored Patient Re-evaluated:Patient Re-evaluated prior to inductionOxygen Delivery Method: Nasal cannula

## 2016-02-21 NOTE — Transfer of Care (Signed)
Immediate Anesthesia Transfer of Care Note  Patient: [name]  Procedure(s) Performed: Procedure(s): ENTEROSCOPY (N/A)  Patient Location: ICU  Anesthesia Type:MAC  Level of Consciousness: awake, alert , oriented and patient cooperative  Airway & Oxygen Therapy: Patient Spontanous Breathing and Patient connected to nasal cannula oxygen  Post-op Assessment: Report given to RN and Post -op Vital signs reviewed and stable  Post vital signs: Reviewed and stable  Last Vitals:  Filed Vitals:   02/21/16 0739 02/21/16 1127  BP:  99/71  Pulse:  53  Temp: 36.8 C 36.9 C  Resp:  18    Last Pain:  Filed Vitals:   02/21/16 1215  PainSc: 0-No pain         Complications: No apparent anesthesia complications

## 2016-02-21 NOTE — Progress Notes (Signed)
Heart rate went down to 40's and 50's, sinus with pvc's occ. Asymptomatic. NP made aware gave order to stop amiodarone gtt.

## 2016-02-21 NOTE — Progress Notes (Signed)
  Called by nursing staff.  Heart rate in the 40s . Stop IV amiodarone. Start amilo 200 mg twice a day in am.   Rachit Grim NP-C  4:20 PM

## 2016-02-21 NOTE — Progress Notes (Signed)
HeartMate 2 Rounding Note  Subjective:     Getting octerotide. Overall he has received 3 UPRBCs. Todays Hgb 7.7 . Had 2 black bms yesterday.    Complaining of dizziness and nausea.  Leg fatigue.   LVAD INTERROGATION:  HeartMate II LVAD: Flow 5.2  liters/min, speed 9000, power 5.3, PI 4.5 < 10 PI events.    Objective:    Vital Signs:   Temp:  [97.5 F (36.4 C)-98.7 F (37.1 C)] 98.3 F (36.8 C) (05/24 0739) Pulse Rate:  [54-69] 54 (05/24 0734) Resp:  [16-18] 16 (05/24 0734) BP: (80)/(66) 80/66 mmHg (05/24 0316) SpO2:  [90 %-98 %] 97 % (05/24 0734) Weight:  [212 lb 3.2 oz (96.253 kg)] 212 lb 3.2 oz (96.253 kg) (05/24 0600) Last BM Date: 02/20/16 Mean arterial Pressure 70s   Intake/Output:   Intake/Output Summary (Last 24 hours) at 02/21/16 0842 Last data filed at 02/21/16 0700  Gross per 24 hour  Intake 1214.1 ml  Output    750 ml  Net  464.1 ml      Physical Exam: General: lying in bed. No resp difficulty HEENT: normal Neck: supple. JVP flat . Carotids 2+ bilat; no bruits. No lymphadenopathy or thryomegaly appreciated. Cor: Mechanical heart sounds with LVAD hum present. Lungs: clear Abdomen: soft, nontender, nondistended. No hepatosplenomegaly. No bruits or masses. Good bowel sounds. Driveline: C/D/I; securement device intact and driveline incorporated Extremities: no cyanosis, clubbing, rash, edema Neuro: alert & orientedx3, cranial nerves grossly intact. moves all 4 extremities w/o difficulty. Affect pleasant  Telemetry: NSR with PVCs 50s  Labs: Basic Metabolic Panel:  Recent Labs Lab 02/18/16 0329 02/19/16 0318 02/19/16 1841 02/20/16 0231 02/20/16 0849 02/21/16 0528  NA 138 139  --  137 137 138  K 4.2 4.0  --  3.9 3.9 4.0  CL 105 108  --  106 105 104  CO2 23 28  --  26 27 29   GLUCOSE 123* 119*  --  139* 124* 129*  BUN 27* 26*  --  26* 27* 22*  CREATININE 0.94 0.99  --  1.98* 0.94 0.94  CALCIUM 9.2 9.4  --  9.1 9.3 9.2  MG  --   --  1.7  1.7  --   --     Liver Function Tests: No results for input(s): AST, ALT, ALKPHOS, BILITOT, PROT, ALBUMIN in the last 168 hours. No results for input(s): LIPASE, AMYLASE in the last 168 hours. No results for input(s): AMMONIA in the last 168 hours.  CBC:  Recent Labs Lab 02/18/16 0329 02/18/16 2332 02/19/16 0318 02/20/16 0231 02/21/16 0528  WBC 6.9 7.2 8.0 8.0 7.7  HGB 8.2* 7.5* 7.8* 8.1* 7.7*  HCT 28.0* 26.4* 26.6* 27.2* 25.8*  MCV 97.9 98.9 97.8 96.5 98.1  PLT 146* 149* 171 137* 142*    INR:  Recent Labs Lab 02/16/16 2213 02/17/16 0722 02/18/16 0329 02/19/16 0318  INR 1.44 1.30 1.45 1.51*    Other results:    Imaging: No results found.   Medications:     Scheduled Medications: . atorvastatin  40 mg Oral q1800  . cholecalciferol  1,000 Units Oral BID  . citalopram  40 mg Oral QHS  . docusate sodium  200 mg Oral BID  . hydrocortisone  1 application Rectal BID  . levothyroxine  100 mcg Oral QHS  . losartan  25 mg Oral Daily  . mometasone-formoterol  2 puff Inhalation BID  . octreotide  100 mcg Subcutaneous TID  . pantoprazole  40  mg Oral BID  . tiotropium  18 mcg Inhalation Daily  . vitamin B-12  1,000 mcg Oral QHS  . vitamin C  500 mg Oral BID    Infusions: . amiodarone 30 mg/hr (02/20/16 1405)    PRN Medications: bisacodyl **OR** bisacodyl, furosemide, ipratropium-albuterol, nitroGLYCERIN, ondansetron, potassium chloride   Assessment:   1. Symptomatic Anemia 2. Recurrent GIB  -- H/O GI bleed.  -- Enteroscopy 12/15 showed bleed from jejunum requiring 3 clips  -- Enteroscopy 2/17 Normal 4. Chronic Systolic HF: S/P LVAD for DT 12/2012  5. OSA 6. NSVT 7. Hypotension  -- Capsule 12/11/15 2 proximal small bowel AVMs, 2 polyps - "terrible prep"  -- Enteroscopy 12/13/15 Normal, No AVMs or residual blood found. 3. Recurrent A Fib      Plan/Discussion:    Admitted with recurrent GI bleed and presyncope due to hypotension.  Also recurrent AF. BP improved with volume support.   Renal function stable.   S/P 3units.  Hgb 7.7.  Give 2 UPRBCs. Ongoing melena.  Off apixaban.  Planning on enteroscopy today. Plan to restart apixaban after scope. Continue octreotide. Continue protonix.  Recent endoscopy and capsule relatively unremarkable (3/17).   Ongoing NSVT. On amio drip. Received  2 grams Mag  5/23. Check Mag after blood. MAPs 70s.   PI running lower. Continue speed 9000. LDH low.   I reviewed the LVAD parameters from today, and compared the results to the patient's prior recorded data.  No programming changes were made.  The LVAD is functioning within specified parameters.  The patient performs LVAD self-test daily.  LVAD interrogation was negative for any significant power changes, alarms or PI events/speed drops.  LVAD equipment check completed and is in good working order.  Back-up equipment present.   LVAD education done on emergency procedures and precautions and reviewed exit site care.  Length of Stay: 5  Amy Clegg NP-C  02/21/2016, 8:42 AM  VAD Team --- VAD ISSUES ONLY--- Pager [phone] (7am - 7am)  Advanced Heart Failure Team  Pager [phone] (M-F; 7a - 4p)  Please contact [facility] for night-coverage after hours (4p -7a ) and weekends on [email or web address]   Patient seen and examined with [name], NP. We discussed all aspects of the encounter. I agree with the assessment and plan as stated above.   Endoscopy today with bleeding AVM that was treated, Also received 2u RBCs. Continue to hold anti-coagulation. Continue octreotide. Still with NSVT. Supping electrolytes. HF symptoms are stable. VAD parameters ok.   Dink Creps,MD 3:48 PM

## 2016-02-21 NOTE — H&P (View-Only) (Signed)
Daily Rounding Note  02/20/2016, 8:23 AM  LOS: 4 days   SUBJECTIVE:       Pt reports 2 black stools yesterday.  Self reported black stool ~ 4 AM today.  Pt flushed this, so not confirmed by staff. Some dizziness with position changes.  Otherwise feels well.  OBJECTIVE:         Vital signs in last 24 hours:    Temp:  [97.4 F (36.3 C)-98.8 F (37.1 C)] 97.4 F (36.3 C) (05/23 0407) Resp:  [15-20] 16 (05/23 0407) SpO2:  [92 %-98 %] 92 % (05/23 0804) Weight:  [96.253 kg (212 lb 3.2 oz)] 96.253 kg (212 lb 3.2 oz) (05/23 0407) Last BM Date: 02/19/16 Filed Weights   02/18/16 0400 02/19/16 0500 02/20/16 0407  Weight: 96.1 kg (211 lb 13.8 oz) 95 kg (209 lb 7 oz) 96.253 kg (212 lb 3.2 oz)   General: looks the same, chronically ill but no distress   Heart: VAD hum.  Chest: clear bil.  No labored resps or cough.  Abdomen: soft, NT, ND.  Good bowel sounds  Extremities: no CCE Neuro/Psych:  Oriented x 3.  No tremor, no limb weakness.  Calm and alert.   Intake/Output from previous day: 05/22 0701 - 05/23 0700 In: 1669.6 [P.O.:600; I.V.:493.6; Blood:576] Out: 750 [Urine:750]  Intake/Output this shift:    Lab Results:  Recent Labs  02/18/16 2332 02/19/16 0318 02/20/16 0231  WBC 7.2 8.0 8.0  HGB 7.5* 7.8* 8.1*  HCT 26.4* 26.6* 27.2*  PLT 149* 171 137*   BMET  Recent Labs  02/18/16 0329 02/19/16 0318 02/20/16 0231  NA 138 139 137  K 4.2 4.0 3.9  CL 105 108 106  CO2 23 28 26   GLUCOSE 123* 119* 139*  BUN 27* 26* 26*  CREATININE 0.94 0.99 1.98*  CALCIUM 9.2 9.4 9.1   LFT No results for input(s): PROT, ALBUMIN, AST, ALT, ALKPHOS, BILITOT, BILIDIR, IBILI in the last 72 hours. PT/INR  Recent Labs  02/18/16 0329 02/19/16 0318  LABPROT 17.7* 18.3*  INR 1.45 1.51*   Hepatitis Panel No results for input(s): HEPBSAG, HCVAB, HEPAIGM, HEPBIGM in the last 72 hours.  Studies/Results: No results found.    Scheduled Meds: . atorvastatin  40 mg Oral q1800  . cholecalciferol  1,000 Units Oral BID  . citalopram  40 mg Oral QHS  . docusate sodium  200 mg Oral BID  . hydrocortisone  1 application Rectal BID  . levothyroxine  100 mcg Oral QHS  . losartan  25 mg Oral Daily  . mometasone-formoterol  2 puff Inhalation BID  . octreotide  100 mcg Subcutaneous TID  . pantoprazole  40 mg Oral BID  . tiotropium  18 mcg Inhalation Daily  . vitamin B-12  1,000 mcg Oral QHS  . vitamin C  500 mg Oral BID   Continuous Infusions: . amiodarone 30 mg/hr (02/19/16 2336)   PRN Meds:.bisacodyl **OR** bisacodyl, furosemide, ipratropium-albuterol, nitroGLYCERIN, ondansetron, potassium chloride   ASSESMENT:   * Recurrent GIB and blood loss anemia. 11 endoscopic and pill camera studies in the past 2 years. These revealed SB, gastric AVMs, GE Jx erosions. CT confirmed cirrhosis 2014 but no portal gastropathy or varices noted on subsequent endoscopies.  S/p PRBCs x 4.  Started SQ Octreotide this admission. , day 5.   * Chronic Eliquis for A fib. On hold, last dose 5/22 PM.   * Ischemic CM. LVAD since 2014.  PLAN   *   ? Home octreotide? *  Schedule enteroscopy for 5/24.      Azucena Freed  02/20/2016, 8:23 AM Pager: [phone]   ________________________________________________________________________  Velora Heckler GI MD note:  I personally examined the patient, reviewed the data and agree with the assessment and plan described above.  I discussed his case with Dr. Aundra Dubin yesterday.  Planning on enteroscopy tomorrow, currently scheduled for noon.     Owens Loffler, MD [facility] Pager [phone]

## 2016-02-21 NOTE — Progress Notes (Signed)
Back from the endoscopy unit, with neo.gtt at 30 mcg who claimed to titrate it to off.

## 2016-02-22 ENCOUNTER — Encounter (HOSPITAL_COMMUNITY): Payer: Self-pay | Admitting: Gastroenterology

## 2016-02-22 ENCOUNTER — Other Ambulatory Visit: Payer: Self-pay | Admitting: Hematology

## 2016-02-22 DIAGNOSIS — D500 Iron deficiency anemia secondary to blood loss (chronic): Secondary | ICD-10-CM

## 2016-02-22 DIAGNOSIS — I5022 Chronic systolic (congestive) heart failure: Secondary | ICD-10-CM

## 2016-02-22 DIAGNOSIS — K922 Gastrointestinal hemorrhage, unspecified: Secondary | ICD-10-CM

## 2016-02-22 LAB — LACTATE DEHYDROGENASE: LDH: 207 U/L — AB (ref 98–192)

## 2016-02-22 LAB — CBC WITH DIFFERENTIAL/PLATELET
BASOS PCT: 0 %
Basophils Absolute: 0 10*3/uL (ref 0.0–0.1)
EOS ABS: 0.1 10*3/uL (ref 0.0–0.7)
EOS PCT: 2 %
HEMATOCRIT: 30.5 % — AB (ref 39.0–52.0)
Hemoglobin: 9 g/dL — ABNORMAL LOW (ref 13.0–17.0)
Lymphocytes Relative: 7 %
Lymphs Abs: 0.5 10*3/uL — ABNORMAL LOW (ref 0.7–4.0)
MCH: 28.3 pg (ref 26.0–34.0)
MCHC: 29.5 g/dL — AB (ref 30.0–36.0)
MCV: 95.9 fL (ref 78.0–100.0)
MONO ABS: 0.6 10*3/uL (ref 0.1–1.0)
MONOS PCT: 10 %
NEUTROS ABS: 5 10*3/uL (ref 1.7–7.7)
Neutrophils Relative %: 81 %
Platelets: 137 10*3/uL — ABNORMAL LOW (ref 150–400)
RBC: 3.18 MIL/uL — ABNORMAL LOW (ref 4.22–5.81)
RDW: 19.5 % — AB (ref 11.5–15.5)
WBC: 6.2 10*3/uL (ref 4.0–10.5)

## 2016-02-22 LAB — BASIC METABOLIC PANEL
Anion gap: 5 (ref 5–15)
BUN: 20 mg/dL (ref 6–20)
CO2: 27 mmol/L (ref 22–32)
CREATININE: 1.08 mg/dL (ref 0.61–1.24)
Calcium: 9 mg/dL (ref 8.9–10.3)
Chloride: 105 mmol/L (ref 101–111)
GFR calc Af Amer: >60 mL/min (ref >60)
Glucose, Bld: 121 mg/dL — ABNORMAL HIGH (ref 65–99)
Potassium: 4.2 mmol/L (ref 3.5–5.1)
SODIUM: 137 mmol/L (ref 135–145)

## 2016-02-22 LAB — CBC
HCT: 29.3 % — ABNORMAL LOW (ref 39.0–52.0)
Hemoglobin: 8.8 g/dL — ABNORMAL LOW (ref 13.0–17.0)
MCH: 29.8 pg (ref 26.0–34.0)
MCHC: 30 g/dL (ref 30.0–36.0)
MCV: 99.3 fL (ref 78.0–100.0)
PLATELETS: 126 10*3/uL — AB (ref 150–400)
RBC: 2.95 MIL/uL — ABNORMAL LOW (ref 4.22–5.81)
RDW: 19.8 % — ABNORMAL HIGH (ref 11.5–15.5)
WBC: 7.2 10*3/uL (ref 4.0–10.5)

## 2016-02-22 LAB — PREPARE RBC (CROSSMATCH)

## 2016-02-22 LAB — MAGNESIUM: MAGNESIUM: 1.9 mg/dL (ref 1.7–2.4)

## 2016-02-22 MED ORDER — APIXABAN 5 MG PO TABS
5.0000 mg | ORAL_TABLET | Freq: Two times a day (BID) | ORAL | Status: DC
  Administered 2016-02-22 – 2016-02-23 (×3): 5 mg via ORAL
  Filled 2016-02-22 (×3): qty 1

## 2016-02-22 MED ORDER — FUROSEMIDE 40 MG PO TABS
40.0000 mg | ORAL_TABLET | Freq: Once | ORAL | Status: AC
  Administered 2016-02-22: 40 mg via ORAL
  Filled 2016-02-22: qty 1

## 2016-02-22 MED ORDER — SODIUM CHLORIDE 0.9 % IV SOLN: Freq: Once | INTRAVENOUS | Status: DC

## 2016-02-22 NOTE — Progress Notes (Signed)
VAD Coordinator Procedure Note:   Patient underwent EGD in endoscopy suite per Dr. Ardis Hughs. Hemodynamics and VAD parameters monitored by anesthesia and myself throughout the procedure. MAPs were obtained with automatic cuff and correlated with doppler modified systolic.       Doppler Auto cuff(MAP): HR: Sat: Flow: PI: Power:     Speed:              Pre-procedure: 12:15  94  99/71 (79)  52 100 5.1 5.7 5.3      9000 12:30    85/65 (70)  51 100 5.2 5.3 5.4 13:00    88/68 (73)  50 100 5.3 5.4 5.3     Sedation Induction:  13:25    100/74 (83)  57 100 4.7 5.2 5.0 13:30    93/75 (81)  61 100 4.3 5.7 4.9 13:45    96/74 (82)  60 100 4.8 5.1 5.1 14:00  80  ---   51 100 4.6 5.0 5.1  Recovery area: 14:15  80  91/72 (79)  51 98 5.1 5.9 5.2   Patient tolerated the procedure well. PIs were > 4.0 throughout the case with no power elevations.   Patient Disposition: [phone]

## 2016-02-22 NOTE — Progress Notes (Signed)
HeartMate 2 Rounding Note  Subjective:   S/P EGD - AVM clip applied. Overall received 5UPRBCs. Todays Hgb 8.8    Feeling better today. Denies SOB. Denies nausea.    LVAD INTERROGATION:  HeartMate II LVAD: Flow 5.0  liters/min, speed 9000, power 5, PI 5.1 < 10 PI events.    Objective:    Vital Signs:   Temp:  [98.1 F (36.7 C)-98.4 F (36.9 C)] 98.1 F (36.7 C) (05/25 0347) Pulse Rate:  [43-53] 53 (05/25 0000) Resp:  [12-25] 23 (05/25 0400) BP: (86-112)/(68-85) 112/85 mmHg (05/25 0400) SpO2:  [85 %-100 %] 90 % (05/25 0347) Weight:  [216 lb (97.977 kg)] 216 lb (97.977 kg) (05/25 0347) Last BM Date: 02/20/16 Mean arterial Pressure 70s   Intake/Output:   Intake/Output Summary (Last 24 hours) at 02/22/16 0752 Last data filed at 02/22/16 0400  Gross per 24 hour  Intake   2731 ml  Output   1000 ml  Net   1731 ml      Physical Exam: General: lying in bed. No resp difficulty HEENT: normal Neck: supple. JVP ~10. Carotids 2+ bilat; no bruits. No lymphadenopathy or thryomegaly appreciated. Cor: Mechanical heart sounds with LVAD hum present. Lungs: clear Abdomen: obese, soft, nontender, nondistended. No hepatosplenomegaly. No bruits or masses. Good bowel sounds. Driveline: C/D/I; securement device intact and driveline incorporated Extremities: no cyanosis, clubbing, rash, edema Neuro: alert & orientedx3, cranial nerves grossly intact. moves all 4 extremities w/o difficulty. Affect pleasant  Telemetry: NSR with PVCs 50s  Labs: Basic Metabolic Panel:  Recent Labs Lab 02/19/16 0318 02/19/16 1841 02/20/16 0231 02/20/16 0849 02/21/16 0528 02/22/16 0327  NA 139  --  137 137 138 137  K 4.0  --  3.9 3.9 4.0 4.2  CL 108  --  106 105 104 105  CO2 28  --  26 27 29 27   GLUCOSE 119*  --  139* 124* 129* 121*  BUN 26*  --  26* 27* 22* 20  CREATININE 0.99  --  1.98* 0.94 0.94 1.08  CALCIUM 9.4  --  9.1 9.3 9.2 9.0  MG  --  1.7 1.7  --   --   --     Liver Function  Tests: No results for input(s): AST, ALT, ALKPHOS, BILITOT, PROT, ALBUMIN in the last 168 hours. No results for input(s): LIPASE, AMYLASE in the last 168 hours. No results for input(s): AMMONIA in the last 168 hours.  CBC:  Recent Labs Lab 02/18/16 2332 02/19/16 0318 02/20/16 0231 02/21/16 0528 02/22/16 0327  WBC 7.2 8.0 8.0 7.7 7.2  HGB 7.5* 7.8* 8.1* 7.7* 8.8*  HCT 26.4* 26.6* 27.2* 25.8* 29.3*  MCV 98.9 97.8 96.5 98.1 99.3  PLT 149* 171 137* 142* 126*    INR:  Recent Labs Lab 02/16/16 2213 02/17/16 0722 02/18/16 0329 02/19/16 0318  INR 1.44 1.30 1.45 1.51*    Other results:    Imaging: No results found.   Medications:     Scheduled Medications: . amiodarone  200 mg Oral BID  . atorvastatin  40 mg Oral q1800  . cholecalciferol  1,000 Units Oral BID  . citalopram  40 mg Oral QHS  . docusate sodium  200 mg Oral BID  . hydrocortisone  1 application Rectal BID  . levothyroxine  100 mcg Oral QHS  . losartan  25 mg Oral Daily  . mometasone-formoterol  2 puff Inhalation BID  . octreotide  100 mcg Subcutaneous TID  . pantoprazole  40 mg  Oral BID  . tiotropium  18 mcg Inhalation Daily  . vitamin B-12  1,000 mcg Oral QHS  . vitamin C  500 mg Oral BID    Infusions:    PRN Medications: bisacodyl **OR** bisacodyl, furosemide, ipratropium-albuterol, nitroGLYCERIN, ondansetron, potassium chloride   Assessment:   1. 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 Recurrent GIB  -- H/O GI bleed.  -- Enteroscopy 12/15 showed bleed from jejunum requiring 3 clips  -- Enteroscopy 2/17 Normal 4. Chronic Systolic HF: S/P LVAD for DT 12/2012  5. OSA 6. NSVT 7. Hypotension  -- Capsule 12/11/15 2 proximal small bowel AVMs, 2 polyps - "terrible prep"  -- Enteroscopy 12/13/15 Normal, No AVMs or residual blood found. 3. Recurrent A Fib      Plan/Discussion:    Admitted with recurrent GI bleed and presyncope due to hypotension. Also recurrent AF. BP improved  with volume support.   S/P 524- with bleeding AVMs clip applied. S/P 5units.  Hgb 8.8.   Restart apixaban 5 mg twice a daily.  Continue octreotide. Continue protonix.    Yesterday Amio drip stopped with bradycardia (heart 40s). Start amio 200 mg twice a day.   Volume status mildly elevated. Give 40 mg po lasix today. MAPs 70s. Continue speed 9000. LDH 186>207. Watch closely.   I reviewed the LVAD parameters from today, and compared the results to the patient's prior recorded data.  No programming changes were made.  The LVAD is functioning within specified parameters.  The patient performs LVAD self-test daily.  LVAD interrogation was negative for any significant power changes, alarms or PI events/speed drops.  LVAD equipment check completed and is in good working order.  Back-up equipment present.   LVAD education done on emergency procedures and precautions and reviewed exit site care.  Ambulate today.   Disposition Possible D/C 24 hours.  Length of Stay: [place] NP-C  02/22/2016, 7:52 AM VAD Team --- VAD ISSUES ONLY--- Pager [phone] (7am - 7am) Advanced Heart Failure Team  Pager [phone] (M-F; 7a - 4p)  Please contact [facility] for night-coverage after hours (4p -7a ) and weekends on [email or web address]  Patient seen and examined with [name], NP. We discussed all aspects of the encounter. I agree with the assessment and plan as stated above.   S/p enteroscopy with APC of bleeding AVM. Hgb now stable. Agree with restarting apixaban. Continue octreotide and protonix. Watch H/H overnight.   VAD parameters ok. Volume status up slightly. Will get oral lasix today.   Haroun Cotham,MD 9:29 AM

## 2016-02-22 NOTE — Anesthesia Postprocedure Evaluation (Signed)
Anesthesia Post Note  Patient: [name]  Procedure(s) Performed: Procedure(s) (LRB): ENTEROSCOPY (N/A)  Patient location during evaluation: PACU Anesthesia Type: General Level of consciousness: awake Pain management: pain level controlled Vital Signs Assessment: post-procedure vital signs reviewed and stable Respiratory status: spontaneous breathing Cardiovascular status: stable Anesthetic complications: no    Last Vitals:  Filed Vitals:   02/22/16 0800 02/22/16 0803  BP: 82/68   Pulse:    Temp:  36.6 C  Resp:  13    Last Pain:  Filed Vitals:   02/22/16 0832  PainSc: 0-No pain                 EDWARDS,Jeancarlo Leffler

## 2016-02-22 NOTE — Progress Notes (Signed)
[name]   DOB:1941/03/20   [id number]   [id number]  Hematology follow up  Subjective: Patient is well known to me, has been receiving IV iron in my office for his iron deficient anemia from GI bleeding. He was recently admitted for worsening anemia, has received 5 units of RBC since admission, possible more tonight. He feels better overall.   Objective:  Filed Vitals:   02/22/16 1143 02/22/16 1638  BP: 90/65 79/61  Pulse:  50  Temp: 97.8 F (36.6 C) 98 F (36.7 C)  Resp: 18 18    Body mass index is 29.29 kg/(m^2).  Intake/Output Summary (Last 24 hours) at 02/22/16 1901 Last data filed at 02/22/16 1800  Gross per 24 hour  Intake   1230 ml  Output   1800 ml  Net   -570 ml     Sclerae unicteric  Oropharynx clear  No peripheral adenopathy  Lungs clear -- no rales or rhonchi  Heart regular rate and rhythm  Abdomen benign  MSK no focal spinal tenderness, no peripheral edema  Neuro nonfocal   CBG (last 3)  No results for input(s): GLUCAP in the last 72 hours.   Labs:  Lab Results  Component Value Date   WBC 6.2 02/22/2016   HGB 9.0* 02/22/2016   HCT 30.5* 02/22/2016   MCV 95.9 02/22/2016   PLT 137* 02/22/2016   NEUTROABS 5.0 02/22/2016    @LASTCHEMISTRY @  Urine Studies No results for input(s): UHGB, CRYS in the last 72 hours.  Invalid input(s): UACOL, UAPR, USPG, UPH, UTP, UGL, UKET, UBIL, UNIT, UROB, ULEU, UEPI, UWBC, URBC, UBAC, CAST, [place], [place]  Basic Metabolic Panel:  Recent Labs Lab 02/19/16 0318 02/19/16 1841 02/20/16 0231 02/20/16 0849 02/21/16 0528 02/22/16 0327 02/22/16 0943  NA 139  --  137 137 138 137  --   K 4.0  --  3.9 3.9 4.0 4.2  --   CL 108  --  106 105 104 105  --   CO2 28  --  26 27 29 27   --   GLUCOSE 119*  --  139* 124* 129* 121*  --   BUN 26*  --  26* 27* 22* 20  --   CREATININE 0.99  --  1.98* 0.94 0.94 1.08  --   CALCIUM 9.4  --  9.1 9.3 9.2 9.0  --   MG  --  1.7 1.7  --   --   --  1.9   GFR Estimated  Creatinine Clearance: 72.8 mL/min (by C-G formula based on Cr of 1.08). Liver Function Tests: No results for input(s): AST, ALT, ALKPHOS, BILITOT, PROT, ALBUMIN in the last 168 hours. No results for input(s): LIPASE, AMYLASE in the last 168 hours. No results for input(s): AMMONIA in the last 168 hours. Coagulation profile  Recent Labs Lab 02/16/16 2213 02/17/16 0722 02/18/16 0329 02/19/16 0318  INR 1.44 1.30 1.45 1.51*    CBC:  Recent Labs Lab 02/19/16 0318 02/20/16 0231 02/21/16 0528 02/22/16 0327 02/22/16 1610  WBC 8.0 8.0 7.7 7.2 6.2  NEUTROABS  --   --   --   --  5.0  HGB 7.8* 8.1* 7.7* 8.8* 9.0*  HCT 26.6* 27.2* 25.8* 29.3* 30.5*  MCV 97.8 96.5 98.1 99.3 95.9  PLT 171 137* 142* 126* 137*   Cardiac Enzymes: No results for input(s): CKTOTAL, CKMB, CKMBINDEX, TROPONINI in the last 168 hours. BNP: Invalid input(s): POCBNP CBG: No results for input(s): GLUCAP in the last 168 hours. D-Dimer  No results for input(s): DDIMER in the last 72 hours. Hgb A1c No results for input(s): HGBA1C in the last 72 hours. Lipid Profile No results for input(s): CHOL, HDL, LDLCALC, TRIG, CHOLHDL, LDLDIRECT in the last 72 hours. Thyroid function studies No results for input(s): TSH, T4TOTAL, T3FREE, THYROIDAB in the last 72 hours.  Invalid input(s): FREET3 Anemia work up  Recent Labs  02/20/16 0231  FERRITIN 35  TIBC 323  IRON 21*   Microbiology Recent Results (from the past 240 hour(s))  MRSA PCR Screening     Status: None   Collection Time: 02/16/16  9:11 PM  Result Value Ref Range Status   MRSA by PCR NEGATIVE NEGATIVE Final    Comment:        The GeneXpert MRSA Assay (FDA approved for NASAL specimens only), is one component of a comprehensive MRSA colonization surveillance program. It is not intended to diagnose MRSA infection nor to guide or monitor treatment for MRSA infections.   C difficile quick scan w PCR reflex     Status: None   Collection Time:  02/19/16  4:00 AM  Result Value Ref Range Status   C Diff antigen NEGATIVE NEGATIVE Final   C Diff toxin NEGATIVE NEGATIVE Final   C Diff interpretation Negative for toxigenic C. difficile  Final      Studies:  No results found.  Assessment: 75 y.o.  1. Symptomatic anemia, secondary to GI bleeding from AVM and iron deficiency 2. Chronic systolic CHF, status post LVAD in 2014 3. OSA 4. AF  Plan:  -His iron level was low on admission 02/20/16, last IV iron on Jan 29, 2016 -Please consider 1 dose of IV Feraheme 510 mg infusion over 30 minutes before discharge -I'll see him in my office in 1-2 weeks for 1 more dose of IV Feraheme -He will follow-up with me every 2 weeks for lab work and IV Feraheme as needed if Feraheme <100    Truitt Merle, MD 02/22/2016  7:01 PM

## 2016-02-23 ENCOUNTER — Telehealth: Payer: Self-pay | Admitting: Hematology

## 2016-02-23 ENCOUNTER — Other Ambulatory Visit: Payer: Self-pay | Admitting: Hematology

## 2016-02-23 ENCOUNTER — Other Ambulatory Visit: Payer: Self-pay | Admitting: *Deleted

## 2016-02-23 ENCOUNTER — Telehealth: Payer: Self-pay | Admitting: Pharmacist

## 2016-02-23 ENCOUNTER — Telehealth: Payer: Self-pay | Admitting: *Deleted

## 2016-02-23 ENCOUNTER — Encounter: Payer: Self-pay | Admitting: Infectious Diseases

## 2016-02-23 DIAGNOSIS — K5521 Angiodysplasia of colon with hemorrhage: Secondary | ICD-10-CM | POA: Insufficient documentation

## 2016-02-23 LAB — CBC
HEMATOCRIT: 33 % — AB (ref 39.0–52.0)
HEMOGLOBIN: 9.8 g/dL — AB (ref 13.0–17.0)
MCH: 28.3 pg (ref 26.0–34.0)
MCHC: 29.7 g/dL — AB (ref 30.0–36.0)
MCV: 95.4 fL (ref 78.0–100.0)
Platelets: 129 10*3/uL — ABNORMAL LOW (ref 150–400)
RBC: 3.46 MIL/uL — AB (ref 4.22–5.81)
RDW: 20.2 % — ABNORMAL HIGH (ref 11.5–15.5)
WBC: 6.2 10*3/uL (ref 4.0–10.5)

## 2016-02-23 LAB — TYPE AND SCREEN
ABO/RH(D): O POS
Antibody Screen: NEGATIVE
UNIT DIVISION: 0
UNIT DIVISION: 0
Unit division: 0

## 2016-02-23 LAB — LACTATE DEHYDROGENASE: LDH: 194 U/L — ABNORMAL HIGH (ref 98–192)

## 2016-02-23 MED ORDER — AMIODARONE HCL 200 MG PO TABS: 200.0000 mg | ORAL_TABLET | Freq: Two times a day (BID) | ORAL | Status: DC

## 2016-02-23 MED ORDER — LEVOTHYROXINE SODIUM 100 MCG PO TABS: 100.0000 ug | ORAL_TABLET | Freq: Every day | ORAL | Status: AC

## 2016-02-23 NOTE — Telephone Encounter (Signed)
Received call from Bonnita Nasuti, Pharm.D. Re: Sandostatin LAR 20 mg inj x recurrent GIB.  This is montly x 6 months. Pt will get IV Feraheme on 02/28/16.  Will get Sandostatin LAR 20 mg (in stock) at [facility] when possible to coordinate w/ iron infusion. Dr. Burr Medico aware. Kennith Center, Pharm.D., CPP 02/23/2016@12 :34 PM

## 2016-02-23 NOTE — Telephone Encounter (Signed)
Added inj per pof

## 2016-02-23 NOTE — Discharge Summary (Signed)
Advanced Heart Failure Team  Discharge Summary   Patient ID: [name] MRN: [id number], DOB/AGE: 1941/05/06 75 y.o. Admit date: 02/16/2016 D/C date:     02/23/2016   Primary Discharge Diagnoses:  1. 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 Recurrent GIB  -- H/O GI bleed.  -- Enteroscopy 12/15 showed bleed from jejunum requiring 3 clips  -- Enteroscopy 2/17 Normal  -- Capsule 12/11/15 2 proximal small bowel AVMs, 2 polyps - "terrible prep"  -- Enteroscopy 12/13/15 Normal, No AVMs or residual blood found.     --Enteroscopy 02/21/16 clip applied to AVM 3. Chronic Systolic HF: S/P LVAD for DT 12/2012  4. OSA 5. NSVT 6. Hypotension 7. 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 Quit smoking 2003. Uses CPAP and O2 every night. He is s/p LVAD HM II implanted 01/12/13 under DT criteria. ICD extracted 2016.  Admitted May 19th with presyncope and symptomatic anemia. Hemoglobin had dropped form 10.4>8.5. Multiple episodes of melena. Placed on PPI and received octreotide. Overall he received 6UPRBCs. GI consulted and had enteroscopy on May 24th with clip applied.Hemoglobin on the day of discharge was 9.8.  Symptomatically he improved and able to walk in his room without difficulty.   He had episodes NSVT and started on IV amio. K and Mag replaced. He developed bradycardia so amio drip was stopped and po amio was restarted. He was in and out of A fib with controlled rate.  Mild volume overload was noted and he received oral lasix. Plan to continue lasix as needed.   He will continue to followed by hematology,[name], for IV Iron as needed. Last received IV Iron on May 1st. Will plan to give octreotide next month.   He will follow up next week in the VAD clinic. Plan to check CBC at that time.   GI Events  03/2011- EGD/Colonscopy Polyp. CT abd  with cirrhosis 06/2013 Push Enteroscopy and Colonoscopy negative 08/2014 Enteroscopy Bleeding from jejunum with 3 clips 11/2015 Enteroscopy normal 11/2015 Capsule Proximal small AVMs, 2 polyps 02/21/2016 Enteroscopy Clip applied to AVM  LVAD INTERROGATION:  HeartMate II LVAD: Flow 4.9 liters/min, speed 9000, power 5.1, PI 5.7 < 10 PI events.    Discharge Weight: 212 pounds.  Discharge Vitals: Blood pressure 92/79, pulse 50, temperature 98.4 F (36.9 C), temperature source Oral, resp. rate 18, height 6' (1.829 m), weight 212 lb 8.4 oz (96.4 kg), SpO2 96 %.  Labs: Lab Results  Component Value Date   WBC 6.2 02/23/2016   HGB 9.8* 02/23/2016   HCT 33.0* 02/23/2016   MCV 95.4 02/23/2016   PLT 129* 02/23/2016     Recent Labs Lab 02/22/16 0327  NA 137  K 4.2  CL 105  CO2 27  BUN 20  CREATININE 1.08  CALCIUM 9.0  GLUCOSE 121*   Lab Results  Component Value Date   CHOL 126 07/02/2012   HDL 40.80 07/02/2012   LDLCALC 58 07/02/2012   TRIG 134.0 07/02/2012   BNP (last 3 results)  Recent Labs  07/04/15 1147 08/22/15 1300 11/07/15 1043  BNP 267.1* 203.2* 398.1*    ProBNP (last 3 results) No results for input(s): PROBNP in the last 8760 hours.   Diagnostic Studies/Procedures   No results found.  Discharge Medications     Medication List    STOP taking these medications  PRESCRIPTION MEDICATION      TAKE these medications        albuterol (2.5 MG/3ML) 0.083% nebulizer solution  Commonly known as:  PROVENTIL  Take 2.5 mg by nebulization every 6 (six) hours as needed for wheezing or shortness of breath.     amiodarone 200 MG tablet  Commonly known as:  PACERONE  Take 1 tablet (200 mg total) by mouth 2 (two) times daily. Take 400 mg (2 tablets) twice daily until 12/17/15, then decrease back to 400 mg (2 tablets) daily.     apixaban 5 MG Tabs tablet  Commonly known as:  ELIQUIS  Take 1 tablet (5 mg total) by mouth 2 (two) times daily.      budesonide-formoterol 160-4.5 MCG/ACT inhaler  Commonly known as:  SYMBICORT  Inhale 2 puffs into the lungs 2 (two) times daily.     cholecalciferol 1000 units tablet  Commonly known as:  VITAMIN D  Take 1 tablet (1,000 Units total) by mouth 2 (two) times daily.     citalopram 40 MG tablet  Commonly known as:  CELEXA  Take 1 tablet (40 mg total) by mouth at bedtime.     docusate sodium 100 MG capsule  Commonly known as:  COLACE  Take 200 mg by mouth 2 (two) times daily.     furosemide 40 MG tablet  Commonly known as:  LASIX  Take 40 mg by mouth daily as needed (for weight more than 225 lbs). Reported on 12/21/2015     hydrocortisone 2.5 % rectal cream  Commonly known as:  ANUSOL-HC  Place 1 application rectally 2 (two) times daily.     levothyroxine 100 MCG tablet  Commonly known as:  SYNTHROID, LEVOTHROID  Take 1 tablet (100 mcg total) by mouth at bedtime.     losartan 25 MG tablet  Commonly known as:  COZAAR  Take 1 tablet (25 mg total) by mouth daily.     multivitamin with minerals Tabs tablet  Take 1 tablet by mouth daily.     nitroGLYCERIN 0.4 MG SL tablet  Commonly known as:  NITROSTAT  Place 0.4 mg under the tongue every 5 (five) minutes as needed for chest pain. Reported on 01/23/2016     pantoprazole 40 MG tablet  Commonly known as:  PROTONIX  Take 1 tablet (40 mg total) by mouth 2 (two) times daily.     potassium chloride SA 20 MEQ tablet  Commonly known as:  K-DUR,KLOR-CON  Take 40 mEq by mouth daily as needed (with furosemide dose).     simvastatin 80 MG tablet  Commonly known as:  ZOCOR  Take 0.5 tablets (40 mg total) by mouth at bedtime.     tiotropium 18 MCG inhalation capsule  Commonly known as:  SPIRIVA  Place 1 capsule (18 mcg total) into inhaler and inhale daily.     vitamin B-12 1000 MCG tablet  Commonly known as:  CYANOCOBALAMIN  Take 1,000 mcg by mouth at bedtime.     vitamin C 500 MG tablet  Commonly known as:  ASCORBIC ACID  Take 1  tablet (500 mg total) by mouth 2 (two) times daily.        Disposition   The patient will be discharged in stable condition to home. Discharge Instructions    Diet - low sodium heart healthy    Complete by:  As directed      Increase activity slowly    Complete by:  As directed  Follow-up Information    Follow up with Ryan Bickers, MD.   Specialty:  Cardiology   Contact information:   [address] [place] [address] [phone]       Follow up On 02/29/2016.   Why:  [employer]         Duration of Discharge Encounter: Greater than 35 minutes   Signed, Amy Clegg NP-C  02/23/2016, 8:38 AM   Patient seen and examined with [name], NP. We discussed all aspects of the encounter. I agree with the assessment and plan as stated above.   Hgb improved. VAD parameters stable. He is ready for d/c. Will f/u in [facility].   Aliviana Burdell,MD 4:38 PM

## 2016-02-23 NOTE — Telephone Encounter (Signed)
Received order from Janene Madeira, RN VAD Coordinator for pt to receive Sandostatin injection on 5/31 when pt is here for Feraheme infusion.   Spoke with Colletta [place], and was informed that Sandostatin was approved by pt's insurance.   Dr. Burr Medico placed order in [facility].   Copy of order sent to be scanned. Stephanie's   Phone     [phone]       ;      Fax        [phone].

## 2016-02-23 NOTE — Progress Notes (Signed)
HeartMate 2 Rounding Note  Subjective:   S/P EGD - AVM clip applied. Overall received 6UPRBCs. Todays Hgb 9.8   Wants to go home. Denies SOB. No fatigue.     LVAD INTERROGATION:  HeartMate II LVAD: Flow 4.9  liters/min, speed 9000, power 5.1, PI 5.7 < 10 PI events.    Objective:    Vital Signs:   Temp:  [97.8 F (36.6 C)-98.7 F (37.1 C)] 98.4 F (36.9 C) (05/26 0744) Pulse Rate:  [48-50] 50 (05/26 0744) Resp:  [13-18] 18 (05/26 0002) BP: (79-96)/(61-79) 92/79 mmHg (05/26 0744) SpO2:  [83 %-100 %] 97 % (05/26 0744) Weight:  [212 lb 8.4 oz (96.4 kg)] 212 lb 8.4 oz (96.4 kg) (05/26 0500) Last BM Date: 02/20/16 Mean arterial Pressure 70-80s   Intake/Output:   Intake/Output Summary (Last 24 hours) at 02/23/16 0802 Last data filed at 02/23/16 0002  Gross per 24 hour  Intake    935 ml  Output   1350 ml  Net   -415 ml      Physical Exam: General: lying in bed. No resp difficulty HEENT: normal Neck: supple. JVP 7-8. Carotids 2+ bilat; no bruits. No lymphadenopathy or thryomegaly appreciated. Cor: Mechanical heart sounds with LVAD hum present. Lungs: clear Abdomen: obese, soft, nontender, nondistended. No hepatosplenomegaly. No bruits or masses. Good bowel sounds. Driveline: C/D/I; securement device intact and driveline incorporated Extremities: no cyanosis, clubbing, rash, edema Neuro: alert & orientedx3, cranial nerves grossly intact. moves all 4 extremities w/o difficulty. Affect pleasant  Telemetry: Afib 50-60s.   Labs: Basic Metabolic Panel:  Recent Labs Lab 02/19/16 0318 02/19/16 1841 02/20/16 0231 02/20/16 0849 02/21/16 0528 02/22/16 0327 02/22/16 0943  NA 139  --  137 137 138 137  --   K 4.0  --  3.9 3.9 4.0 4.2  --   CL 108  --  106 105 104 105  --   CO2 28  --  26 27 29 27   --   GLUCOSE 119*  --  139* 124* 129* 121*  --   BUN 26*  --  26* 27* 22* 20  --   CREATININE 0.99  --  1.98* 0.94 0.94 1.08  --   CALCIUM 9.4  --  9.1 9.3 9.2 9.0  --    MG  --  1.7 1.7  --   --   --  1.9    Liver Function Tests: No results for input(s): AST, ALT, ALKPHOS, BILITOT, PROT, ALBUMIN in the last 168 hours. No results for input(s): LIPASE, AMYLASE in the last 168 hours. No results for input(s): AMMONIA in the last 168 hours.  CBC:  Recent Labs Lab 02/19/16 0318 02/20/16 0231 02/21/16 0528 02/22/16 0327 02/22/16 1610  WBC 8.0 8.0 7.7 7.2 6.2  NEUTROABS  --   --   --   --  5.0  HGB 7.8* 8.1* 7.7* 8.8* 9.0*  HCT 26.6* 27.2* 25.8* 29.3* 30.5*  MCV 97.8 96.5 98.1 99.3 95.9  PLT 171 137* 142* 126* 137*    INR:  Recent Labs Lab 02/16/16 2213 02/17/16 0722 02/18/16 0329 02/19/16 0318  INR 1.44 1.30 1.45 1.51*    Other results:    Imaging: No results found.   Medications:     Scheduled Medications: . sodium chloride   Intravenous Once  . amiodarone  200 mg Oral BID  . apixaban  5 mg Oral BID  . atorvastatin  40 mg Oral q1800  . cholecalciferol  1,000 Units Oral BID  .  citalopram  40 mg Oral QHS  . docusate sodium  200 mg Oral BID  . hydrocortisone  1 application Rectal BID  . levothyroxine  100 mcg Oral QHS  . losartan  25 mg Oral Daily  . mometasone-formoterol  2 puff Inhalation BID  . octreotide  100 mcg Subcutaneous TID  . pantoprazole  40 mg Oral BID  . tiotropium  18 mcg Inhalation Daily  . vitamin B-12  1,000 mcg Oral QHS  . vitamin C  500 mg Oral BID    Infusions:    PRN Medications: bisacodyl **OR** bisacodyl, furosemide, ipratropium-albuterol, nitroGLYCERIN, ondansetron, potassium chloride   Assessment:   1. 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 Recurrent GIB  -- H/O GI bleed.  -- Enteroscopy 12/15 showed bleed from jejunum requiring 3 clips  -- Enteroscopy 2/17 Normal 4. Chronic Systolic HF: S/P LVAD for DT 12/2012  5. OSA 6. NSVT 7. Hypotension  -- Capsule 12/11/15 2 proximal small bowel AVMs, 2 polyps - "terrible prep"  -- Enteroscopy 12/13/15 Normal, No AVMs or residual blood  found. 3. Recurrent A Fib      Plan/Discussion:    Admitted with recurrent GI bleed and presyncope due to hypotension. Also recurrent AF. BP improved with volume support.   S/P 5/24- with bleeding AVMs clip applied. S/P 5units.  Hgb 9.8.   Continue apixaban 5 mg twice a daily. Set up octreotide 20 mg IM in 1 month.   Continue protonix.    Heart in the 60s. Continue  amio 200 mg twice a day.   Volume status improved.  Weight down 4 pounds. Continue lasix as needed. MAPs 70-80s.  Continue speed 9000. LDH 194.   I reviewed the LVAD parameters from today, and compared the results to the patient's prior recorded data.  No programming changes were made.  The LVAD is functioning within specified parameters.  The patient performs LVAD self-test daily.  LVAD interrogation was negative for any significant power changes, alarms or PI events/speed drops.  LVAD equipment check completed and is in good working order.  Back-up equipment present.   LVAD education done on emergency procedures and precautions and reviewed exit site care.  Home today. Check CBC next week.  Length of Stay: 7  Amy Clegg NP-C  02/23/2016, 8:02 AM VAD Team --- VAD ISSUES ONLY--- Pager [phone] (7am - 7am) Advanced Heart Failure Team  Pager [phone] (M-F; 7a - 4p)  Please contact [facility] for night-coverage after hours (4p -7a ) and weekends on [email or web address]  Patient seen and examined with [name], NP. We discussed all aspects of the encounter. I agree with the assessment and plan as stated above.   Much improved. Hgb stable after treatment of actively bleeding AVM. Back on apixaban. VAD parameters ok. Can go home today with close f/u in VAD clinic.   Bensimhon, Daniel,MD 4:39 PM

## 2016-02-23 NOTE — Telephone Encounter (Signed)
Per staff phone call and POF I have schedueld appts. Scheduler advised of appts.  JMW  

## 2016-02-23 NOTE — Telephone Encounter (Signed)
per pof to sch pt appt-MW sch fera-pt to get updated copy on 5/31

## 2016-02-23 NOTE — Progress Notes (Signed)
Octreotide-LA 20 mg IM Qmonth x 6 has been approved from [facility]. Attempting to coordinate with Oncology team to administer these injections with his IV Iron infusions to help with cost and patient preference. [facility] lab to discuss with his nurse and no record of Octreotide with upcoming infusion. Orders for the above faxed to [phone] and Dr. Burr Medico has been notified of requested plan of care.   D/W [facility] HF/VAD Pharmacy team to ensure medication can arrive for next infusion on 5/31 and can be delivered to [facility] Day[facility]Lab.   Janene Madeira, RN VAD Coordinator   Office: [phone] 24/7 VAD Pager: [phone]

## 2016-02-23 NOTE — Progress Notes (Signed)
Patient being discharged to home.  Discharge summary and instructions reviewed with patient and patient's wife.  All IV's removed.  All patient belongings returned to patient including patient's LVAD batteries, pocket controller, and back up equipment.  VAD coordinator aware of patient's discharge and confirmed with RN what equipment patient was to be discharged with. Patient and patient's wife had no questions.

## 2016-02-26 ENCOUNTER — Emergency Department (HOSPITAL_COMMUNITY): Payer: Non-veteran care

## 2016-02-26 ENCOUNTER — Encounter (HOSPITAL_COMMUNITY): Payer: Self-pay | Admitting: Emergency Medicine

## 2016-02-26 ENCOUNTER — Inpatient Hospital Stay (HOSPITAL_COMMUNITY)
Admission: EM | Admit: 2016-02-26 | Discharge: 2016-03-03 | DRG: 871 | Disposition: A | Discharge status: Home / Self Care | Payer: Non-veteran care | Attending: Cardiology | Admitting: Cardiology

## 2016-02-26 ENCOUNTER — Telehealth: Payer: Self-pay | Admitting: Infectious Diseases

## 2016-02-26 DIAGNOSIS — R630 Anorexia: Secondary | ICD-10-CM

## 2016-02-26 DIAGNOSIS — Z95811 Presence of heart assist device: Secondary | ICD-10-CM | POA: Diagnosis not present

## 2016-02-26 DIAGNOSIS — Z951 Presence of aortocoronary bypass graft: Secondary | ICD-10-CM | POA: Diagnosis not present

## 2016-02-26 DIAGNOSIS — Z79899 Other long term (current) drug therapy: Secondary | ICD-10-CM | POA: Diagnosis not present

## 2016-02-26 DIAGNOSIS — I252 Old myocardial infarction: Secondary | ICD-10-CM | POA: Diagnosis not present

## 2016-02-26 DIAGNOSIS — Z7951 Long term (current) use of inhaled steroids: Secondary | ICD-10-CM

## 2016-02-26 DIAGNOSIS — Z7901 Long term (current) use of anticoagulants: Secondary | ICD-10-CM

## 2016-02-26 DIAGNOSIS — J440 Chronic obstructive pulmonary disease with acute lower respiratory infection: Secondary | ICD-10-CM | POA: Diagnosis present

## 2016-02-26 DIAGNOSIS — I472 Ventricular tachycardia: Secondary | ICD-10-CM | POA: Diagnosis present

## 2016-02-26 DIAGNOSIS — I110 Hypertensive heart disease with heart failure: Secondary | ICD-10-CM | POA: Diagnosis present

## 2016-02-26 DIAGNOSIS — I10 Essential (primary) hypertension: Secondary | ICD-10-CM

## 2016-02-26 DIAGNOSIS — F329 Major depressive disorder, single episode, unspecified: Secondary | ICD-10-CM | POA: Diagnosis present

## 2016-02-26 DIAGNOSIS — R1031 Right lower quadrant pain: Secondary | ICD-10-CM | POA: Diagnosis present

## 2016-02-26 DIAGNOSIS — A419 Sepsis, unspecified organism: Principal | ICD-10-CM | POA: Diagnosis present

## 2016-02-26 DIAGNOSIS — I5023 Acute on chronic systolic (congestive) heart failure: Secondary | ICD-10-CM | POA: Diagnosis not present

## 2016-02-26 DIAGNOSIS — R6521 Severe sepsis with septic shock: Secondary | ICD-10-CM | POA: Diagnosis present

## 2016-02-26 DIAGNOSIS — I5020 Unspecified systolic (congestive) heart failure: Secondary | ICD-10-CM

## 2016-02-26 DIAGNOSIS — N179 Acute kidney failure, unspecified: Secondary | ICD-10-CM | POA: Diagnosis present

## 2016-02-26 DIAGNOSIS — E785 Hyperlipidemia, unspecified: Secondary | ICD-10-CM | POA: Diagnosis present

## 2016-02-26 DIAGNOSIS — I959 Hypotension, unspecified: Secondary | ICD-10-CM | POA: Diagnosis present

## 2016-02-26 DIAGNOSIS — E860 Dehydration: Secondary | ICD-10-CM

## 2016-02-26 DIAGNOSIS — E039 Hypothyroidism, unspecified: Secondary | ICD-10-CM | POA: Diagnosis present

## 2016-02-26 DIAGNOSIS — I255 Ischemic cardiomyopathy: Secondary | ICD-10-CM | POA: Diagnosis present

## 2016-02-26 DIAGNOSIS — F4321 Adjustment disorder with depressed mood: Secondary | ICD-10-CM | POA: Diagnosis present

## 2016-02-26 DIAGNOSIS — J189 Pneumonia, unspecified organism: Secondary | ICD-10-CM | POA: Diagnosis present

## 2016-02-26 DIAGNOSIS — K8000 Calculus of gallbladder with acute cholecystitis without obstruction: Secondary | ICD-10-CM

## 2016-02-26 DIAGNOSIS — I5022 Chronic systolic (congestive) heart failure: Secondary | ICD-10-CM

## 2016-02-26 DIAGNOSIS — D649 Anemia, unspecified: Secondary | ICD-10-CM | POA: Diagnosis present

## 2016-02-26 DIAGNOSIS — I509 Heart failure, unspecified: Secondary | ICD-10-CM | POA: Diagnosis not present

## 2016-02-26 DIAGNOSIS — I5043 Acute on chronic combined systolic (congestive) and diastolic (congestive) heart failure: Secondary | ICD-10-CM | POA: Diagnosis not present

## 2016-02-26 DIAGNOSIS — K648 Other hemorrhoids: Secondary | ICD-10-CM | POA: Diagnosis present

## 2016-02-26 DIAGNOSIS — G4733 Obstructive sleep apnea (adult) (pediatric): Secondary | ICD-10-CM | POA: Diagnosis present

## 2016-02-26 DIAGNOSIS — R062 Wheezing: Secondary | ICD-10-CM | POA: Diagnosis not present

## 2016-02-26 DIAGNOSIS — I4891 Unspecified atrial fibrillation: Secondary | ICD-10-CM

## 2016-02-26 DIAGNOSIS — Z87891 Personal history of nicotine dependence: Secondary | ICD-10-CM

## 2016-02-26 DIAGNOSIS — K5520 Angiodysplasia of colon without hemorrhage: Secondary | ICD-10-CM | POA: Diagnosis present

## 2016-02-26 DIAGNOSIS — R1011 Right upper quadrant pain: Secondary | ICD-10-CM

## 2016-02-26 DIAGNOSIS — J449 Chronic obstructive pulmonary disease, unspecified: Secondary | ICD-10-CM | POA: Diagnosis present

## 2016-02-26 DIAGNOSIS — Z9981 Dependence on supplemental oxygen: Secondary | ICD-10-CM

## 2016-02-26 DIAGNOSIS — I480 Paroxysmal atrial fibrillation: Secondary | ICD-10-CM | POA: Diagnosis not present

## 2016-02-26 DIAGNOSIS — R531 Weakness: Secondary | ICD-10-CM

## 2016-02-26 DIAGNOSIS — K8001 Calculus of gallbladder with acute cholecystitis with obstruction: Secondary | ICD-10-CM | POA: Diagnosis present

## 2016-02-26 DIAGNOSIS — W19XXXA Unspecified fall, initial encounter: Secondary | ICD-10-CM

## 2016-02-26 DIAGNOSIS — R7401 Elevation of levels of liver transaminase levels: Secondary | ICD-10-CM

## 2016-02-26 DIAGNOSIS — R740 Nonspecific elevation of levels of transaminase and lactic acid dehydrogenase [LDH]: Secondary | ICD-10-CM

## 2016-02-26 DIAGNOSIS — K819 Cholecystitis, unspecified: Secondary | ICD-10-CM

## 2016-02-26 DIAGNOSIS — I2510 Atherosclerotic heart disease of native coronary artery without angina pectoris: Secondary | ICD-10-CM | POA: Diagnosis present

## 2016-02-26 DIAGNOSIS — K7460 Unspecified cirrhosis of liver: Secondary | ICD-10-CM | POA: Diagnosis present

## 2016-02-26 DIAGNOSIS — K810 Acute cholecystitis: Secondary | ICD-10-CM | POA: Diagnosis not present

## 2016-02-26 DIAGNOSIS — I444 Left anterior fascicular block: Secondary | ICD-10-CM | POA: Diagnosis present

## 2016-02-26 LAB — TYPE AND SCREEN
ABO/RH(D): O POS
ANTIBODY SCREEN: NEGATIVE

## 2016-02-26 LAB — ACETAMINOPHEN LEVEL: Acetaminophen (Tylenol), Serum: <10 ug/mL — ABNORMAL LOW (ref 10–30)

## 2016-02-26 LAB — I-STAT CG4 LACTIC ACID, ED
LACTIC ACID, VENOUS: 1.65 mmol/L (ref 0.5–2.0)
Lactic Acid, Venous: 1.47 mmol/L (ref 0.5–2.0)

## 2016-02-26 LAB — CBC WITH DIFFERENTIAL/PLATELET
BASOS ABS: 0 10*3/uL (ref 0.0–0.1)
BASOS PCT: 0 %
Eosinophils Absolute: 0 10*3/uL (ref 0.0–0.7)
Eosinophils Relative: 0 %
HCT: 31.1 % — ABNORMAL LOW (ref 39.0–52.0)
Hemoglobin: 9.4 g/dL — ABNORMAL LOW (ref 13.0–17.0)
Lymphocytes Relative: 3 %
Lymphs Abs: 0.4 10*3/uL — ABNORMAL LOW (ref 0.7–4.0)
MCH: 27.4 pg (ref 26.0–34.0)
MCHC: 30.2 g/dL (ref 30.0–36.0)
MCV: 90.7 fL (ref 78.0–100.0)
Monocytes Absolute: 0.4 10*3/uL (ref 0.1–1.0)
Monocytes Relative: 4 %
NEUTROS ABS: 10.1 10*3/uL — AB (ref 1.7–7.7)
NEUTROS PCT: 93 %
PLATELETS: 137 10*3/uL — AB (ref 150–400)
RBC: 3.43 MIL/uL — ABNORMAL LOW (ref 4.22–5.81)
RDW: 18.6 % — ABNORMAL HIGH (ref 11.5–15.5)
WBC: 10.9 10*3/uL — ABNORMAL HIGH (ref 4.0–10.5)

## 2016-02-26 LAB — COMPREHENSIVE METABOLIC PANEL
ALT: 237 U/L — AB (ref 17–63)
ANION GAP: 7 (ref 5–15)
AST: 275 U/L — ABNORMAL HIGH (ref 15–41)
Albumin: 2.7 g/dL — ABNORMAL LOW (ref 3.5–5.0)
Alkaline Phosphatase: 159 U/L — ABNORMAL HIGH (ref 38–126)
BUN: 29 mg/dL — ABNORMAL HIGH (ref 6–20)
CALCIUM: 9.2 mg/dL (ref 8.9–10.3)
CHLORIDE: 102 mmol/L (ref 101–111)
CO2: 26 mmol/L (ref 22–32)
CREATININE: 1.42 mg/dL — AB (ref 0.61–1.24)
GFR, EST AFRICAN AMERICAN: 55 mL/min — AB (ref >60)
GFR, EST NON AFRICAN AMERICAN: 47 mL/min — AB (ref >60)
Glucose, Bld: 97 mg/dL (ref 65–99)
Potassium: 3.4 mmol/L — ABNORMAL LOW (ref 3.5–5.1)
SODIUM: 135 mmol/L (ref 135–145)
Total Bilirubin: 7.4 mg/dL — ABNORMAL HIGH (ref 0.3–1.2)
Total Protein: 5.2 g/dL — ABNORMAL LOW (ref 6.5–8.1)

## 2016-02-26 LAB — I-STAT CHEM 8, ED
BUN: 29 mg/dL — AB (ref 6–20)
CREATININE: 1.4 mg/dL — AB (ref 0.61–1.24)
Calcium, Ion: 1.23 mmol/L (ref 1.13–1.30)
Chloride: 99 mmol/L — ABNORMAL LOW (ref 101–111)
Glucose, Bld: 92 mg/dL (ref 65–99)
HEMATOCRIT: 31 % — AB (ref 39.0–52.0)
Hemoglobin: 10.5 g/dL — ABNORMAL LOW (ref 13.0–17.0)
POTASSIUM: 3.4 mmol/L — AB (ref 3.5–5.1)
Sodium: 137 mmol/L (ref 135–145)
TCO2: 24 mmol/L (ref 0–100)

## 2016-02-26 LAB — PROTIME-INR
INR: 2.74 — AB (ref 0.00–1.49)
PROTHROMBIN TIME: 28.6 s — AB (ref 11.6–15.2)

## 2016-02-26 LAB — LACTATE DEHYDROGENASE: LDH: 243 U/L — AB (ref 98–192)

## 2016-02-26 LAB — POC OCCULT BLOOD, ED: FECAL OCCULT BLD: POSITIVE — AB

## 2016-02-26 LAB — LIPASE, BLOOD: LIPASE: 22 U/L (ref 11–51)

## 2016-02-26 MED ORDER — PIPERACILLIN-TAZOBACTAM 3.375 G IVPB 30 MIN
3.3750 g | Freq: Once | INTRAVENOUS | Status: AC
  Administered 2016-02-26: 3.375 g via INTRAVENOUS
  Filled 2016-02-26: qty 50

## 2016-02-26 MED ORDER — CITALOPRAM HYDROBROMIDE 20 MG PO TABS
40.0000 mg | ORAL_TABLET | Freq: Every day | ORAL | Status: DC
  Administered 2016-02-27 (×2): 40 mg via ORAL
  Filled 2016-02-26 (×2): qty 2

## 2016-02-26 MED ORDER — VITAMIN B-12 1000 MCG PO TABS
1000.0000 ug | ORAL_TABLET | Freq: Every day | ORAL | Status: DC
Start: 2016-02-26 — End: 2016-03-03
  Administered 2016-02-27 – 2016-03-02 (×6): 1000 ug via ORAL
  Filled 2016-02-26 (×6): qty 1

## 2016-02-26 MED ORDER — PIPERACILLIN-TAZOBACTAM 3.375 G IVPB
3.3750 g | Freq: Three times a day (TID) | INTRAVENOUS | Status: DC
  Administered 2016-02-27 – 2016-03-02 (×14): 3.375 g via INTRAVENOUS
  Filled 2016-02-26 (×15): qty 50

## 2016-02-26 MED ORDER — PANTOPRAZOLE SODIUM 40 MG PO TBEC
40.0000 mg | DELAYED_RELEASE_TABLET | Freq: Two times a day (BID) | ORAL | Status: DC
  Administered 2016-02-27: 40 mg via ORAL
  Filled 2016-02-26: qty 1

## 2016-02-26 MED ORDER — TIOTROPIUM BROMIDE MONOHYDRATE 18 MCG IN CAPS
18.0000 ug | ORAL_CAPSULE | Freq: Every day | RESPIRATORY_TRACT | Status: DC
  Administered 2016-02-27 – 2016-03-02 (×5): 18 ug via RESPIRATORY_TRACT
  Filled 2016-02-26 (×3): qty 5

## 2016-02-26 MED ORDER — FENTANYL CITRATE (PF) 100 MCG/2ML IJ SOLN
25.0000 ug | Freq: Once | INTRAMUSCULAR | Status: AC
  Administered 2016-02-26: 25 ug via INTRAVENOUS
  Filled 2016-02-26: qty 2

## 2016-02-26 MED ORDER — ALBUTEROL SULFATE (2.5 MG/3ML) 0.083% IN NEBU: 2.5000 mg | INHALATION_SOLUTION | Freq: Four times a day (QID) | RESPIRATORY_TRACT | Status: DC | PRN

## 2016-02-26 MED ORDER — VANCOMYCIN HCL IN DEXTROSE 750-5 MG/150ML-% IV SOLN
750.0000 mg | Freq: Two times a day (BID) | INTRAVENOUS | Status: DC
  Filled 2016-02-26: qty 150

## 2016-02-26 MED ORDER — DOCUSATE SODIUM 100 MG PO CAPS
200.0000 mg | ORAL_CAPSULE | Freq: Two times a day (BID) | ORAL | Status: DC
  Administered 2016-02-27 – 2016-03-03 (×12): 200 mg via ORAL
  Filled 2016-02-26 (×13): qty 2

## 2016-02-26 MED ORDER — SODIUM CHLORIDE 0.9 % IV SOLN
INTRAVENOUS | Status: DC
  Administered 2016-02-26: 21:00:00 via INTRAVENOUS

## 2016-02-26 MED ORDER — VANCOMYCIN HCL 10 G IV SOLR
1500.0000 mg | Freq: Once | INTRAVENOUS | Status: AC
  Administered 2016-02-26: 1500 mg via INTRAVENOUS
  Filled 2016-02-26: qty 1500

## 2016-02-26 MED ORDER — SODIUM CHLORIDE 0.9 % IV BOLUS (SEPSIS)
250.0000 mL | Freq: Once | INTRAVENOUS | Status: AC
  Administered 2016-02-26: 250 mL via INTRAVENOUS

## 2016-02-26 MED ORDER — MOMETASONE FURO-FORMOTEROL FUM 200-5 MCG/ACT IN AERO
2.0000 | INHALATION_SPRAY | Freq: Two times a day (BID) | RESPIRATORY_TRACT | Status: DC
  Administered 2016-02-27 – 2016-03-03 (×12): 2 via RESPIRATORY_TRACT
  Filled 2016-02-26: qty 8.8

## 2016-02-26 MED ORDER — LEVOTHYROXINE SODIUM 100 MCG PO TABS
100.0000 ug | ORAL_TABLET | Freq: Every day | ORAL | Status: DC
  Administered 2016-02-27 – 2016-03-02 (×6): 100 ug via ORAL
  Filled 2016-02-26 (×6): qty 1

## 2016-02-26 NOTE — ED Notes (Signed)
Patient transported to Ultrasound and XR.

## 2016-02-26 NOTE — ED Notes (Signed)
MD at bedside. 

## 2016-02-26 NOTE — ED Notes (Signed)
Per EMS:  Pt was released Friday after a week in the hospital for a GI bleed that required endoscopic repair.  Pt sts he has felt nauseous and weak since d/c and had a fall today due to his weakness.  Pt denies LOC or head injury.  Pt has a skin tear on his right arm that was covered by Friday.  100-238ml of fluid given en route.  Pt c/o black, tarry stools today.  LVAD team made aware, and MD at bedside

## 2016-02-26 NOTE — ED Notes (Signed)
Cardiology at the bedside.

## 2016-02-26 NOTE — Telephone Encounter (Signed)
Page received from [place] re: [name] having syncopal episode at home this evening. No apparent injury with fall. Also reporting dark tarry stools since he was [facility] home Friday 5/27 after recurrent GIB/anemia. Was [name] home on 5mg  BID apixaban and was scheduled for his first dose of IM Octreotide-LA tomorrow with IV Iron. Ambulance is en route to bring him to [facility] ED. Dr  Aundra Dubin, ED team and [facility] notified of upcoming arrival.

## 2016-02-26 NOTE — ED Provider Notes (Signed)
CSN: [id number]     Arrival date & time 02/26/16  1751 History   First MD Initiated Contact with Patient 02/26/16 1756     Chief Complaint  Patient presents with  . Fall  . Weakness     (Consider location/radiation/quality/duration/timing/severity/associated sxs/prior Treatment) HPI  75 year old male with a history of ischemic cardiomyopathy with LVAD device in place, hyperlipidemia, atrial fibrillation, chronic anticoagulation, COPD, hypertension, recent admission for GI bleed, presents with concern for generalized weakness, decreased appetite, and right-sided abdominal pain.  Patietn reports R sided abdominal pain over the last 2 days, lower right quadrant and suprapubic, worse with movements and leaning over.  Not worse with eating, however has not had much to eat.  Severe nausea, fatigue which has essentially continued since pt was discharged from the hospital 3 days.  Wife notes his eyes have looked yellow beginning yesterday.  Pt felt lightheaded and fell onto his left arm. Denies other injuries. Did not hit head, did not lose consciousness. Denies neck pain, numbness, weakness. 1 black stool on Saturday, none since then. Family concerned he may have another GI bleed given severe weakness.    Past Medical History  Diagnosis Date  . Ischemic cardiomyopathy      CABG 2003, PCI 2007  EF 27%(myoview 2012  . Chronic systolic heart failure ([place])   . Hyperlipidemia   . Hypothyroidism   . Chronic anticoagulation     Afib and LVAD  . Obesity   . COPD (chronic obstructive pulmonary disease) ([place])   . Asbestosis([place])     "6 years in the [place]" (05/24/2013)  . Atrial fibrillation ([place])     permanent  . Paroxysmal ventricular tachycardia ([place])   . Coronary artery disease   . Hypertension   . Depression   . LVAD (left ventricular assist device) present ([place]) 12/2012  . Epistaxis 11/2015, 07/2014  . Cancer ([place])     "scraped some off behind my left ear; fast moving; having it cut out  12/05/2015" (11/07/2015)  . CHF (congestive heart failure) ([place])   . Myocardial infarction ([place]) 1990's-2000    "2 in  ~ the 1990's; 1 in ~ 2000" (11/07/2015)  . On home oxygen therapy     "have it available; don't use it" (11/07/2015)  . OSA on CPAP   . Anemia   . History of blood transfusion 08/2015 X 10; 11/2015 X 2    "related to bleeding on the inside somewhere" (11/07/2015)  . Complication of anesthesia     "they can't put me all the way to sleep cause of my heart" (11/07/2015)   Past Surgical History  Procedure Laterality Date  . Coronary artery bypass graft  2003    "?X4" (05/24/2013)  . Cardiac defibrillator placement  2004; ~ 2010    "cut it out in 2016 after it got infected"   . Insertion of implantable left ventricular assist device N/A 01/12/2013    Procedure: INSERTION OF IMPLANTABLE LEFT VENTRICULAR ASSIST DEVICE;  Surgeon: Ivin Poot, MD;  Location: [place];  Service: Open Heart Surgery;  Laterality: N/A;   nitric oxide; Redo sternotomy  . Intraoperative transesophageal echocardiogram N/A 01/12/2013    Procedure: INTRAOPERATIVE TRANSESOPHAGEAL ECHOCARDIOGRAM;  Surgeon: Ivin Poot, MD;  Location: [place];  Service: Open Heart Surgery;  Laterality: N/A;  . Colonoscopy N/A 03/09/2014    Procedure: COLONOSCOPY;  Surgeon: Inda Castle, MD;  Location: [place];  Service: Endoscopy;  Laterality: N/A;  LVAD  patient  . Esophagogastroduodenoscopy N/A  03/09/2014    Procedure: ESOPHAGOGASTRODUODENOSCOPY (EGD);  Surgeon: Inda Castle, MD;  Location: [place];  Service: Endoscopy;  Laterality: N/A;  . Givens capsule study N/A 03/10/2014    Procedure: GIVENS CAPSULE STUDY;  Surgeon: Inda Castle, MD;  Location: [place];  Service: Endoscopy;  Laterality: N/A;  . Enteroscopy N/A 07/27/2014    Procedure: ENTEROSCOPY;  Surgeon: Gatha Mayer, MD;  Location: [place];  Service: Endoscopy;  Laterality: N/A;  LVAD patient   . Colonoscopy N/A 07/29/2014    Procedure:  COLONOSCOPY;  Surgeon: Gatha Mayer, MD;  Location: [place];  Service: Endoscopy;  Laterality: N/A;  . Givens capsule study N/A 07/30/2014    Procedure: GIVENS CAPSULE STUDY;  Surgeon: Gatha Mayer, MD;  Location: [place];  Service: Endoscopy;  Laterality: N/A;  . Esophagogastroduodenoscopy N/A 08/01/2014    Procedure: ESOPHAGOGASTRODUODENOSCOPY (EGD);  Surgeon: Jerene Bears, MD;  Location[facility] ENDOSCOPY;  Service: Endoscopy;  Laterality: N/A;  LVAD patient  . Left and right heart catheterization with coronary/graft angiogram  01/07/2013    Procedure: LEFT AND RIGHT HEART CATHETERIZATION WITH Beatrix Fetters;  Surgeon: Jolaine Artist, MD;  Location: [facility] CATH LAB;  Service: Cardiovascular;;  . Enteroscopy N/A 09/27/2014    Procedure: ENTEROSCOPY;  Surgeon: Gatha Mayer, MD;  Location: [facility] ENDOSCOPY;  Service: Endoscopy;  Laterality: N/A;  . Icd lead removal Left 03/16/2015    Procedure: ICD LEAD REMOVAL/EXTRACTION;  Surgeon: Evans Lance, MD;  Location: [place];  Service: Cardiovascular;  Laterality: Left;  PATIENT HAS LVAD  DR. VAN TRIGT TO BACK UP EXTRACTION  . Appendectomy    . Enteroscopy N/A 11/09/2015    Procedure: ENTEROSCOPY;  Surgeon: Mauri Pole, MD;  Location: [facility] ENDOSCOPY;  Service: Endoscopy;  Laterality: N/A;  LVAD  . Givens capsule study N/A 12/11/2015    Procedure: GIVENS CAPSULE STUDY;  Surgeon: Gatha Mayer, MD;  Location: [place];  Service: Endoscopy;  Laterality: N/A;  . Enteroscopy N/A 12/13/2015    Procedure: ENTEROSCOPY;  Surgeon: Milus Banister, MD;  Location: [place];  Service: Endoscopy;  Laterality: N/A;  . Enteroscopy N/A 02/21/2016    Procedure: ENTEROSCOPY;  Surgeon: Milus Banister, MD;  Location: [place];  Service: Endoscopy;  Laterality: N/A;   Family History  Problem Relation Age of Onset  . Heart attack Mother   . Heart attack Father    Social History  Substance Use Topics  . Smoking status: Former Smoker --  2.00 packs/day for 45 years    Types: Cigarettes    Quit date: 11/11/2001  . Smokeless tobacco: Never Used  . Alcohol Use: Yes     Comment: 05/24/2013 "use to drink beer; hardly nothing since 2003; nothing at all since 12/2011 when I got my LVAD  "    Review of Systems  Constitutional: Positive for activity change, appetite change and fatigue. Negative for fever and chills.  HENT: Negative for sore throat.   Eyes: Negative for visual disturbance.  Respiratory: Negative for cough and shortness of breath.   Cardiovascular: Negative for chest pain.  Gastrointestinal: Positive for nausea, abdominal pain and blood in stool (last BM was melena 1 episode on Saturday). Negative for vomiting, diarrhea and constipation.  Genitourinary: Negative for dysuria and difficulty urinating.  Musculoskeletal: Negative for back pain and neck stiffness.  Skin: Negative for rash.  Neurological: Positive for light-headedness. Negative for syncope, weakness, numbness and headaches.      Allergies  Review of  patient's allergies indicates no known allergies.  Home Medications   Prior to Admission medications   Medication Sig Start Date End Date Taking? Authorizing Provider  albuterol (PROVENTIL) (2.5 MG/3ML) 0.083% nebulizer solution Take 2.5 mg by nebulization every 6 (six) hours as needed for wheezing or shortness of breath.    Historical Provider, MD  amiodarone (PACERONE) 200 MG tablet Take 1 tablet (200 mg total) by mouth 2 (two) times daily. Take 400 mg (2 tablets) twice daily until 12/17/15, then decrease back to 400 mg (2 tablets) daily. 02/23/16   Amy D Ninfa Meeker, NP  apixaban (ELIQUIS) 5 MG TABS tablet Take 1 tablet (5 mg total) by mouth 2 (two) times daily. 12/25/15   Jolaine Artist, MD  budesonide-formoterol (SYMBICORT) 160-4.5 MCG/ACT inhaler Inhale 2 puffs into the lungs 2 (two) times daily. 01/10/15   Jolaine Artist, MD  cholecalciferol (VITAMIN D) 1000 UNITS tablet Take 1 tablet (1,000 Units  total) by mouth 2 (two) times daily. 01/10/15   Jolaine Artist, MD  citalopram (CELEXA) 40 MG tablet Take 1 tablet (40 mg total) by mouth at bedtime. 01/10/15   Jolaine Artist, MD  docusate sodium (COLACE) 100 MG capsule Take 200 mg by mouth 2 (two) times daily.    Historical Provider, MD  furosemide (LASIX) 40 MG tablet Take 40 mg by mouth daily as needed (for weight more than 225 lbs). Reported on 12/21/2015    Historical Provider, MD  hydrocortisone (ANUSOL-HC) 2.5 % rectal cream Place 1 application rectally 2 (two) times daily. Patient taking differently: Place 1 application rectally 2 (two) times daily as needed for hemorrhoids.  11/21/15   Larey Dresser, MD  levothyroxine (SYNTHROID, LEVOTHROID) 100 MCG tablet Take 1 tablet (100 mcg total) by mouth at bedtime. 02/23/16   Amy D Clegg, NP  losartan (COZAAR) 25 MG tablet Take 1 tablet (25 mg total) by mouth daily. 01/23/16   Jolaine Artist, MD  Multiple Vitamin (MULTIVITAMIN WITH MINERALS) TABS tablet Take 1 tablet by mouth daily.    Historical Provider, MD  nitroGLYCERIN (NITROSTAT) 0.4 MG SL tablet Place 0.4 mg under the tongue every 5 (five) minutes as needed for chest pain. Reported on 01/23/2016    Historical Provider, MD  pantoprazole (PROTONIX) 40 MG tablet Take 1 tablet (40 mg total) by mouth 2 (two) times daily. 01/10/15   Jolaine Artist, MD  potassium chloride SA (K-DUR,KLOR-CON) 20 MEQ tablet Take 40 mEq by mouth daily as needed (with furosemide dose).    Historical Provider, MD  simvastatin (ZOCOR) 80 MG tablet Take 0.5 tablets (40 mg total) by mouth at bedtime. 01/10/15   Jolaine Artist, MD  tiotropium (SPIRIVA) 18 MCG inhalation capsule Place 1 capsule (18 mcg total) into inhaler and inhale daily. 01/10/15   Jolaine Artist, MD  vitamin B-12 (CYANOCOBALAMIN) 1000 MCG tablet Take 1,000 mcg by mouth at bedtime.    Historical Provider, MD  vitamin C (ASCORBIC ACID) 500 MG tablet Take 1 tablet (500 mg total) by mouth 2  (two) times daily. 01/10/15   Shaune Pascal Bensimhon, MD   BP 79/64 mmHg  Pulse 68  Temp(Src) 98.2 F (36.8 C) (Oral)  Resp 12  Ht 6' (1.829 m)  Wt 218 lb 0.6 oz (98.9 kg)  BMI 29.56 kg/m2  SpO2 92% Physical Exam  Constitutional: He is oriented to person, place, and time. He appears well-developed and well-nourished. No distress.  HENT:  Head: Normocephalic and atraumatic.  Mouth/Throat: Mucous membranes are  dry.  Eyes: Conjunctivae and EOM are normal. Scleral icterus is present.  Neck: Normal range of motion.  Cardiovascular:  Sound of LVAD, unable to hear underlying heart sounds, No pulses secondary to LVAD   Pulmonary/Chest: Effort normal and breath sounds normal. No respiratory distress. He has no wheezes. He has no rales.  Abdominal: Soft. He exhibits no distension. There is tenderness in the right upper quadrant. There is guarding (RUQ) and positive Gonet's sign. There is no tenderness at McBurney's point.  Drive line without surrounding erythema or fluctuance   Genitourinary: Guaiac positive stool (no stool on rectal exam, no melena).  Musculoskeletal: He exhibits no edema.  Neurological: He is alert and oriented to person, place, and time. He has normal strength. No cranial nerve deficit or sensory deficit. GCS eye subscore is 4. GCS verbal subscore is 5. GCS motor subscore is 6.  Skin: Skin is warm and dry. He is not diaphoretic.  Skin tear left arm under bandage  Nursing note and vitals reviewed.   ED Course  Procedures (including critical care time) Labs Review Labs Reviewed  CBC WITH DIFFERENTIAL/PLATELET - Abnormal; Notable for the following:    WBC 10.9 (*)    RBC 3.43 (*)    Hemoglobin 9.4 (*)    HCT 31.1 (*)    RDW 18.6 (*)    Platelets 137 (*)    Neutro Abs 10.1 (*)    Lymphs Abs 0.4 (*)    All other components within normal limits  COMPREHENSIVE METABOLIC PANEL - Abnormal; Notable for the following:    Potassium 3.4 (*)    BUN 29 (*)    Creatinine, Ser  1.42 (*)    Total Protein 5.2 (*)    Albumin 2.7 (*)    AST 275 (*)    ALT 237 (*)    Alkaline Phosphatase 159 (*)    Total Bilirubin 7.4 (*)    GFR calc non Af Amer 47 (*)    GFR calc Af Amer 55 (*)    All other components within normal limits  PROTIME-INR - Abnormal; Notable for the following:    Prothrombin Time 28.6 (*)    INR 2.74 (*)    All other components within normal limits  ACETAMINOPHEN LEVEL - Abnormal; Notable for the following:    Acetaminophen (Tylenol), Serum <10 (*)    All other components within normal limits  LACTATE DEHYDROGENASE - Abnormal; Notable for the following:    LDH 243 (*)    All other components within normal limits  I-STAT CHEM 8, ED - Abnormal; Notable for the following:    Potassium 3.4 (*)    Chloride 99 (*)    BUN 29 (*)    Creatinine, Ser 1.40 (*)    Hemoglobin 10.5 (*)    HCT 31.0 (*)    All other components within normal limits  POC OCCULT BLOOD, ED - Abnormal; Notable for the following:    Fecal Occult Bld POSITIVE (*)    All other components within normal limits  CULTURE, BLOOD (ROUTINE X 2)  CULTURE, BLOOD (ROUTINE X 2)  LIPASE, BLOOD  HEPATITIS PANEL, ACUTE  LACTATE DEHYDROGENASE  PROTIME-INR  COMPREHENSIVE METABOLIC PANEL  CBC  I-STAT CG4 LACTIC ACID, ED  I-STAT CG4 LACTIC ACID, ED  TYPE AND SCREEN    Imaging Review Dg Chest 1 View  02/26/2016  CLINICAL DATA:  Pneumonia.  Shortness of breath.  Fall tonight. EXAM: CHEST 1 VIEW COMPARISON:  02/16/2016 FINDINGS: Patient is post median  sternotomy. Cardiomegaly is again seen, question mild improvement. Left ventricular assist device again seen. Right infrahilar opacity which may be new from prior. Diaphragmatic calcifications bilaterally. No pulmonary edema. No large pleural effusion. IMPRESSION: 1. Right infrahilar opacity may reflect pneumonia. This is likely new from prior exam. 2. Cardiomegaly, grossly stable or minimally improved. No pulmonary edema. Electronically Signed    By: Jeb Levering M.D.   On: 02/26/2016 21:14   US Abdomen Limited Ruq  02/26/2016  CLINICAL DATA:  Elevated transaminase level. EXAM: US ABDOMEN LIMITED - RIGHT UPPER QUADRANT COMPARISON:  CT 03/09/2014 FINDINGS: Gallbladder: Distended. Gallstones are noted with a 1.1 cm stone in the gallbladder neck. Mild intraluminal sludge. Gallbladder wall thickness measures 3-6 mm. Small amount of pericholecystic fluid. No sonographic Tucci sign noted by sonographer. Common bile duct: Diameter: 3 mm Liver: No focal lesion identified. Diffusely increased in parenchymal echogenicity. Nodular capsular contours. Normal directional flow in the main portal vein. IMPRESSION: 1. Cirrhotic hepatic morphology with nodular contours and increased echogenicity, similar to prior CT. No focal lesion is seen sonographically. 2. Cholelithiasis. Mild gallbladder distention or wall thickening. Small amount of pericholecystic fluid. Wall thickening and pericholecystic fluid are common findings in chronic liver disease, however acute cholecystitis can have this appearance. Nuclear medicine HIDA scan may be of value based on clinical concern. 3. No biliary dilatation. Electronically Signed   By: Jeb Levering M.D.   On: 02/26/2016 21:12   I have personally reviewed and evaluated these images and lab results as part of my medical decision-making.   EKG Interpretation   Date/Time:  Monday Feb 26 2016 18:09:35 EDT Ventricular Rate:  82 PR Interval:    QRS Duration: 103 QT Interval:  460 QTC Calculation: 537 R Axis:   -73 Text Interpretation:  Atrial fibrillation Ventricular premature complex  LAD, consider left anterior fascicular block Anteroseptal infarct, age  indeterminate Lateral leads are also involved Prolonged QT interval  Artifact in lead(s) II III aVR aVL aVF V1 V2 V4 V5 V6 Since prior ECG, QRS  has narrowed Confirmed by [facility] MD, Joss Friedel ([id number]) on 02/27/2016 2:03:59  AM      MDM   Final diagnoses:   Elevated transaminase level  Hypotension, unspecified hypotension type  Dehydration  Cholecystitis, concern for likely cholecystitis  Transaminitis  Hyperbilirubinemia  Right upper quadrant pain  Decreased appetite  Fall, initial encounter  Generalized weakness  LVAD (left ventricular assist device) present ([facility])  Anemia, unspecified anemia type   75 year old male with a history of ischemic cardiomyopathy with LVAD device in place, hyperlipidemia, atrial fibrillation, chronic anticoagulation, COPD, hypertension, recent admission for GI bleed, presents with concern for generalized weakness, decreased appetite, and right-sided abdominal pain.  LVAD coordinator, and cardiology were contacted on patient's arrival. Maps were initially in 72s, however improved with IV fluid.  Patients last BM was one black stool on Saturday, and he's had no further episodes, has no stool on the rectal exam, stable hemoglobin, and have low suspicion for acute GI bleed this time. Labs are significant for transaminitis, elevation in alkaline phosphatase, and bilirubin elevated to 7. Patient describes right lower quadrant pain by history, has significant right upper quadrant tenderness on exam, concerning for acute cholecystitis. Right upper quadrant ultrasound was obtained which showed cholelithiasis, gallbladder wall thickening and pericholecystic fluid, however in the setting of cirrhosis.  While cirrhosis may affect these results, feel exam is concerning for cholecystitis.  Discussed with Dr. Molli Posey of general surgery who feels patient is appropriate for abx  and admitting team may re-consult Surgery in the AM given medical problems.  Blood cx obtained and pt given vanc/zosyn. Pt also with CXR showing possible pneumonia, although clinical history is not consistent with this. Pt admitted for continued care by Dr. Aundra Dubin.    Gareth Morgan, MD 02/27/16 [phone]

## 2016-02-26 NOTE — Progress Notes (Signed)
Pharmacy Antibiotic Note  [name] is a 75 y.o. male admitted on 02/26/2016 with sepsis from presumed intraabdominal source. Pt was released on 5/26 after being admitted for GI bleed with endoscopic repair. Pharmacy has been consulted for vancomycin and Zosyn dosing.  WBC 10.9, afebrile, lactic acid 1.65, SCr 1.42, CrCl ~55, BP 80s/70s  Plan: --Vancomycin 1500 mg  IV x 1, then 750 mg every 12 hours.  Goal trough 15-20 mcg/mL. --Zosyn 3.375g IV q8h (4 hour infusion; 1st dose over 30 min). --f/u cultures, LOT, trough at Css     Temp (24hrs), Avg:98.1 F (36.7 C), Min:98.1 F (36.7 C), Max:98.1 F (36.7 C)   Recent Labs Lab 02/20/16 0849 02/21/16 0528 02/22/16 0327 02/22/16 1610 02/23/16 0643 02/26/16 1839 02/26/16 1840 02/26/16 2049  WBC  --  7.7 7.2 6.2 6.2  --  10.9*  --   CREATININE 0.94 0.94 1.08  --   --  1.40* 1.42*  --   LATICACIDVEN  --   --   --   --   --   --  1.47 1.65    Estimated Creatinine Clearance: 54.9 mL/min (by C-G formula based on Cr of 1.42).    No Known Allergies  Antimicrobials this admission: 5/29 vancomycin >>  5/29 Zosyn >>   Dose adjustments this admission: n/a  Microbiology results: Pending  Thank you for allowing pharmacy to be a part of this patient's care.  Governor Specking, PharmD Clinical Pharmacy Resident Pager: [phone] 02/26/2016 8:55 PM

## 2016-02-26 NOTE — H&P (Addendum)
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 Quit smoking 2003. Uses CPAP and O2 every night. He is s/p LVAD HM II implanted 01/12/13 under DT criteria  Admit 6/8-6/12/14 for symptomatic anemia. Had EGD/Colonoscopy which was unrevealing. 6 mm polyp clipped and removed. CT abdomen showed cirrhosis. Placed on heparin bridge. Capsule study pending. Sent home on lovenox. D/C weight 206 lbs.   Admitted to [facility] with with GI bleed. GI consulted. Push enteroscopy 07/27/13 negative. Colonoscopy 10/29 without source of bleeding.Result of capsule endoscopy--> Showed bleeding from duodenum. EGD--->Bleeding from duodenum with clip applied. Overall he received 10 UPRBCs. Discharge weight was 207 pounds.   Admitted to [facility] 09/21/14 with GI bleed . GI consulted. Enteroscopy showed bleed from jejunum requiring 3 clips. Overall he received 4 units PRBCs. He went back in A fib and had DC-CV. On the day discharge he was back in NSR on amiodarone 200 mg twice a day. Discharge weight was 214 pounds.   Admitted in 6/16 for ICD extraction due to pocket infection. Did well. No complications.   Admitted 2/17 with BRPBR. Ultimately, thought to be due to epistaxis + hemorrhoidal bleeding but Hgb continued to trend down so underwent GI eval. Enteroscopy was normal.   Admitted 12/08/2015 with symptomatic anemia. GI consulted and performed Capsule 12/11/15 2 proximal small bowel AVMs, 2 nonbleeding polyps which were removed. Received 3UPRBCs. He had recurrent A fib but chemically converted with amio drip. Later transitioned to amio 400 mg bid. Losartan was held due to low MAP and dizziness. Coumadin switched to apixaban.   Admitted 02/16/16 with presyncope and symptomatic anemia.  Hemoglobin had dropped form 10.4>8.5. Multiple episodes of melena. Placed on PPI and received octreotide. Overall he received 6U PRBCs. 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  Hemoglobin on the day of discharge was 9.8. Plan had been to start IM octreotide this week.   Patient went home last week.  Since discharge, he has only had 1 BM => that was on Saturday, BM was black.  Since discharge, he has had significant nausea.  No emesis but has felt quite bad.  He has had minimal po intake since discharge due to nausea.  He has had right upper quadrant pain for 5-6 days now.  It does not seem to be related to eating but he has eaten very little.  Wife noted scleral icterus yesterday.  Today, his mouth was dry and he was "dizzy" all day.  Fell once with presyncope.  Does not check BP at home.   In ER, initial MAP 61.  This rose to 70s with IV fluid. Afebrile, but WBCs up to 10.9 from 6.2 at discharge.  Hemoglobin was 9.8 at discharge and is 9.4 today.  LFTs (AST, ALT, tbili) are all significantly elevated.  He was initially in atrial fibrillation, but went back into NSR in the ER (60s).   LVAD INTERROGATION:  HeartMate II LVAD:  Flow 5.6 liters/min, speed 9000, power 5.4, PI 4.7.  2 PI events in last 24 hrs  Review of Systems: All systems reviewed and negative except as per HPI.   Home Medications Prior to Admission medications   Medication Sig Start  Date End Date Taking? Authorizing Provider  albuterol (PROVENTIL) (2.5 MG/3ML) 0.083% nebulizer solution Take 2.5 mg by nebulization every 6 (six) hours as needed for wheezing or shortness of breath.    Historical Provider, MD  amiodarone (PACERONE) 200 MG tablet Take 1 tablet (200 mg total) by mouth 2 (two) times daily. Take 400 mg (2 tablets) twice daily until 12/17/15, then decrease back to 400 mg (2 tablets) daily. 02/23/16   Amy D Ninfa [place], NP  apixaban (ELIQUIS) 5 MG TABS tablet Take 1 tablet (5 mg total) by mouth  2 (two) times daily. 12/25/15   Jolaine Artist, MD  budesonide-formoterol (SYMBICORT) 160-4.5 MCG/ACT inhaler Inhale 2 puffs into the lungs 2 (two) times daily. 01/10/15   Jolaine Artist, MD  cholecalciferol (VITAMIN D) 1000 UNITS tablet Take 1 tablet (1,000 Units total) by mouth 2 (two) times daily. 01/10/15   Jolaine Artist, MD  citalopram (CELEXA) 40 MG tablet Take 1 tablet (40 mg total) by mouth at bedtime. 01/10/15   Jolaine Artist, MD  docusate sodium (COLACE) 100 MG capsule Take 200 mg by mouth 2 (two) times daily.    Historical Provider, MD  furosemide (LASIX) 40 MG tablet Take 40 mg by mouth daily as needed (for weight more than 225 lbs). Reported on 12/21/2015    Historical Provider, MD  hydrocortisone (ANUSOL-HC) 2.5 % rectal cream Place 1 application rectally 2 (two) times daily. Patient taking differently: Place 1 application rectally 2 (two) times daily as needed for hemorrhoids.  11/21/15   Larey Dresser, MD  levothyroxine (SYNTHROID, LEVOTHROID) 100 MCG tablet Take 1 tablet (100 mcg total) by mouth at bedtime. 02/23/16   Amy D Clegg, NP  losartan (COZAAR) 25 MG tablet Take 1 tablet (25 mg total) by mouth daily. 01/23/16   Jolaine Artist, MD  Multiple Vitamin (MULTIVITAMIN WITH MINERALS) TABS tablet Take 1 tablet by mouth daily.    Historical Provider, MD  nitroGLYCERIN (NITROSTAT) 0.4 MG SL tablet Place 0.4 mg under the tongue every 5 (five) minutes as needed for chest pain. Reported on 01/23/2016    Historical Provider, MD  pantoprazole (PROTONIX) 40 MG tablet Take 1 tablet (40 mg total) by mouth 2 (two) times daily. 01/10/15   Jolaine Artist, MD  potassium chloride SA (K-DUR,KLOR-CON) 20 MEQ tablet Take 40 mEq by mouth daily as needed (with furosemide dose).    Historical Provider, MD  simvastatin (ZOCOR) 80 MG tablet Take 0.5 tablets (40 mg total) by mouth at bedtime. 01/10/15   Jolaine Artist, MD  tiotropium (SPIRIVA) 18 MCG inhalation capsule Place 1 capsule (18  mcg total) into inhaler and inhale daily. 01/10/15   Jolaine Artist, MD  vitamin B-12 (CYANOCOBALAMIN) 1000 MCG tablet Take 1,000 mcg by mouth at bedtime.    Historical Provider, MD  vitamin C (ASCORBIC ACID) 500 MG tablet Take 1 tablet (500 mg total) by mouth 2 (two) times daily. 01/10/15   Jolaine Artist, MD    Past Medical History: Past Medical History  Diagnosis Date  . Ischemic cardiomyopathy      CABG 2003, PCI 2007  EF 27%(myoview 2012  . Chronic systolic heart failure ([place])   . Hyperlipidemia   . Hypothyroidism   . Chronic anticoagulation     Afib and LVAD  . Obesity   . COPD (chronic obstructive pulmonary disease) ([place])   . Asbestosis(501)     "6 years in the [place]" (05/24/2013)  . Atrial fibrillation ([place])  permanent  . Paroxysmal ventricular tachycardia ([place])   . Coronary artery disease   . Hypertension   . Depression   . LVAD (left ventricular assist device) present ([place]) 12/2012  . Epistaxis 11/2015, 07/2014  . Cancer ([place])     "scraped some off behind my left ear; fast moving; having it cut out 12/05/2015" (11/07/2015)  . CHF (congestive heart failure) ([place])   . Myocardial infarction ([place]) 1990's-2000    "2 in  ~ the 1990's; 1 in ~ 2000" (11/07/2015)  . On home oxygen therapy     "have it available; don't use it" (11/07/2015)  . OSA on CPAP   . Anemia   . History of blood transfusion 08/2015 X 10; 11/2015 X 2    "related to bleeding on the inside somewhere" (11/07/2015)  . Complication of anesthesia     "they can't put me all the way to sleep cause of my heart" (11/07/2015)    Past Surgical History: Past Surgical History  Procedure Laterality Date  . Coronary artery bypass graft  2003    "?X4" (05/24/2013)  . Cardiac defibrillator placement  2004; ~ 2010    "cut it out in 2016 after it got infected"   . Insertion of implantable left ventricular assist device N/A 01/12/2013    Procedure: INSERTION OF IMPLANTABLE LEFT VENTRICULAR ASSIST DEVICE;  Surgeon: Ivin Poot, MD;  Location: [place];  Service: Open Heart Surgery;  Laterality: N/A;   nitric oxide; Redo sternotomy  . Intraoperative transesophageal echocardiogram N/A 01/12/2013    Procedure: INTRAOPERATIVE TRANSESOPHAGEAL ECHOCARDIOGRAM;  Surgeon: Ivin Poot, MD;  Location: [place];  Service: Open Heart Surgery;  Laterality: N/A;  . Colonoscopy N/A 03/09/2014    Procedure: COLONOSCOPY;  Surgeon: Inda Castle, MD;  Location: [place];  Service: Endoscopy;  Laterality: N/A;  LVAD  patient  . Esophagogastroduodenoscopy N/A 03/09/2014    Procedure: ESOPHAGOGASTRODUODENOSCOPY (EGD);  Surgeon: Inda Castle, MD;  Location: [place];  Service: Endoscopy;  Laterality: N/A;  . Givens capsule study N/A 03/10/2014    Procedure: GIVENS CAPSULE STUDY;  Surgeon: Inda Castle, MD;  Location: [place];  Service: Endoscopy;  Laterality: N/A;  . Enteroscopy N/A 07/27/2014    Procedure: ENTEROSCOPY;  Surgeon: Gatha Mayer, MD;  Location: [place];  Service: Endoscopy;  Laterality: N/A;  LVAD patient   . Colonoscopy N/A 07/29/2014    Procedure: COLONOSCOPY;  Surgeon: Gatha Mayer, MD;  Location: [place];  Service: Endoscopy;  Laterality: N/A;  . Givens capsule study N/A 07/30/2014    Procedure: GIVENS CAPSULE STUDY;  Surgeon: Gatha Mayer, MD;  Location: [place];  Service: Endoscopy;  Laterality: N/A;  . Esophagogastroduodenoscopy N/A 08/01/2014    Procedure: ESOPHAGOGASTRODUODENOSCOPY (EGD);  Surgeon: Jerene Bears, MD;  Location: [facility] ENDOSCOPY;  Service: Endoscopy;  Laterality: N/A;  LVAD patient  . Left and right heart catheterization with coronary/graft angiogram  01/07/2013    Procedure: LEFT AND RIGHT HEART CATHETERIZATION WITH Beatrix Fetters;  Surgeon: Jolaine Artist, MD;  Location: [facility] CATH LAB;  Service: Cardiovascular;;  . Enteroscopy N/A 09/27/2014    Procedure: ENTEROSCOPY;  Surgeon: Gatha Mayer, MD;  Location: [facility] ENDOSCOPY;  Service: Endoscopy;   Laterality: N/A;  . Icd lead removal Left 03/16/2015    Procedure: ICD LEAD REMOVAL/EXTRACTION;  Surgeon: Evans Lance, MD;  Location: [place];  Service: Cardiovascular;  Laterality: Left;  PATIENT HAS LVAD  DR. VAN TRIGT TO BACK UP EXTRACTION  .  Appendectomy    . Enteroscopy N/A 11/09/2015    Procedure: ENTEROSCOPY;  Surgeon: Mauri Pole, MD;  Location: [facility] ENDOSCOPY;  Service: Endoscopy;  Laterality: N/A;  LVAD  . Givens capsule study N/A 12/11/2015    Procedure: GIVENS CAPSULE STUDY;  Surgeon: Gatha Mayer, MD;  Location: [place];  Service: Endoscopy;  Laterality: N/A;  . Enteroscopy N/A 12/13/2015    Procedure: ENTEROSCOPY;  Surgeon: Milus Banister, MD;  Location: [place];  Service: Endoscopy;  Laterality: N/A;  . Enteroscopy N/A 02/21/2016    Procedure: ENTEROSCOPY;  Surgeon: Milus Banister, MD;  Location: [place];  Service: Endoscopy;  Laterality: N/A;    Family History: Family History  Problem Relation Age of Onset  . Heart attack Mother   . Heart attack Father     Social History: Social History   Social History  . Marital Status: Married    Spouse Name: N/A  . Number of Children: N/A  . Years of Education: N/A   Social History Main Topics  . Smoking status: Former Smoker -- 2.00 packs/day for 45 years    Types: Cigarettes    Quit date: 11/11/2001  . Smokeless tobacco: Never Used  . Alcohol Use: Yes     Comment: 05/24/2013 "use to drink beer; hardly nothing since 2003; nothing at all since 12/2011 when I got my LVAD  "  . Drug Use: No  . Sexual Activity: No   Other Topics Concern  . None   Social History Narrative    Allergies:  No Known Allergies  Objective:    Vital Signs:   Temp:  [98.1 F (36.7 C)] 98.1 F (36.7 C) (05/29 1943) Pulse Rate:  [80-84] 84 (05/29 2000) Resp:  [24-25] 24 (05/29 2000) BP: (72-111)/(53-64) 111/64 mmHg (05/29 2000) SpO2:  [92 %-94 %] 92 % (05/29 2000)   There were no vitals filed for this visit.  Mean  arterial Pressure 61 => 70s  Physical Exam: General:  NAD HEENT: Scleral icterus.  Neck: supple. JVP not elevated. Carotids 2+ bilat; no bruits. No lymphadenopathy or thryomegaly appreciated. Cor: Mechanical heart sounds with LVAD hum present. Lungs: clear Abdomen: soft, decreased bowel sounds, nontender ventral hernia.  The RUQ is moderately tender to palpation without rebound or guarding.  Not rigid.   Driveline: C/D/I; securement device intact and driveline incorporated Extremities: no cyanosis, clubbing, rash, edema Skin: Mild jaundice.  Neuro: alert & orientedx3, cranial nerves grossly intact. moves all 4 extremities w/o difficulty. Affect pleasant  Telemetry: initial atrial fibrillation => NSR  Labs: Basic Metabolic Panel:  Recent Labs Lab 02/20/16 0231 02/20/16 0849 02/21/16 0528 02/22/16 0327 02/22/16 0943 02/26/16 1839 02/26/16 1840  NA 137 137 138 137  --  137 135  K 3.9 3.9 4.0 4.2  --  3.4* 3.4*  CL 106 105 104 105  --  99* 102  CO2 26 27 29 27   --   --  26  GLUCOSE 139* 124* 129* 121*  --  92 97  BUN 26* 27* 22* 20  --  29* 29*  CREATININE 1.98* 0.94 0.94 1.08  --  1.40* 1.42*  CALCIUM 9.1 9.3 9.2 9.0  --   --  9.2  MG 1.7  --   --   --  1.9  --   --     Liver Function Tests:  Recent Labs Lab 02/26/16 1840  AST 275*  ALT 237*  ALKPHOS 159*  BILITOT 7.4*  PROT 5.2*  ALBUMIN  2.7*   No results for input(s): LIPASE, AMYLASE in the last 168 hours. No results for input(s): AMMONIA in the last 168 hours.  CBC:  Recent Labs Lab 02/21/16 0528 02/22/16 0327 02/22/16 1610 02/23/16 0643 02/26/16 1839 02/26/16 1840  WBC 7.7 7.2 6.2 6.2  --  10.9*  NEUTROABS  --   --  5.0  --   --  10.1*  HGB 7.7* 8.8* 9.0* 9.8* 10.5* 9.4*  HCT 25.8* 29.3* 30.5* 33.0* 31.0* 31.1*  MCV 98.1 99.3 95.9 95.4  --  90.7  PLT 142* 126* 137* 129*  --  137*    Cardiac Enzymes: No results for input(s): CKTOTAL, CKMB, CKMBINDEX, TROPONINI in the last 168  hours.  BNP: BNP (last 3 results)  Recent Labs  07/04/15 1147 08/22/15 1300 11/07/15 1043  BNP 267.1* 203.2* 398.1*    ProBNP (last 3 results) No results for input(s): PROBNP in the last 8760 hours.   CBG: No results for input(s): GLUCAP in the last 168 hours.  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 Quit smoking 2003. Uses CPAP and O2 every night. He is s/p LVAD HM II implanted 01/12/13 under DT criteria.  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  Tonight, he was admitted with hypotension, RUQ pain, and elevated LFTs.  1. Hypotension: MAP initially in 60s with dizziness.  Rose quickly to the 70s with 250 cc bolus IVF.  Possible dehydration in setting of poor po intake and nausea for a number of days.  Cannot rule out sepsis => WBCs higher than prior though afebrile.  Does not appear to be actively bleeding.  - Will obtain blood cultures, empiric coverage for now with Zosyn/vancomycin.  - BP better after bolus.  Will give NS 100 cc/hr x 15 hours => reassess tomorrow.  - Follow hemoglobin closely with recent GI bleed.  2. 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  Treated with APC and clipping.  Had black stool on Saturday but no BM since then.  Hemoglobin was 9.8 at last discharge and was 9.4 today.  No definite active bleeding.   - Follow hemoglobin closely.  3. GI: Elevated LFTs (transaminases and tbili) with icterus and RUQ tenderness.  Concern for gallbladder disease (with common bile duct obstruction) versus acute viral hepatitis versus shock liver from hypotension.  He is not volume  overloaded on exam.  - RUQ [place] to be done in ER to assess gallbladder, bile duct, and liver.  - Sending viral hepatitis labs.  - Follow CMET daily.  - Think we will need to hold statin and amiodarone for now.  4. Atrial fibrillation: Paroxysmal.  Seems to tolerate poorly.  At admission, he was in atrial fibrillation but currently in NSR.  As above, hold amiodarone for now with very elevated LFTs.  5. LVAD: HeartMate II.  VAD parameters all looked ok on interrogation tonight.  He is on Eliquis 5 mg bid.  - Will hold Eliquis dose tonight in case he has an abdominal emergency and needs surgical evaluation.  6. AKI: Creatinine mildly elevated, likely due to dehydration/hypotension.  Hold losartan and  Lasix.  7. NSVT: History of NSVT runs.  Watch while holding amiodarone.   I reviewed the LVAD parameters from today, and compared the results to the patient's prior recorded data.  No programming changes were made.  The LVAD is functioning within specified parameters.  The patient performs LVAD self-test daily.  LVAD interrogation was negative for any significant power changes, alarms or PI events/speed drops.  LVAD equipment check completed and is in good working order.  Back-up equipment present.   LVAD education done on emergency procedures and precautions and reviewed exit site care.  Length of Stay:   Loralie Champagne 02/26/2016, 8:43 PM  VAD Team Pager [phone] (7am - 7am) +++VAD ISSUES ONLY+++   Advanced Heart Failure Team Pager [phone] (M-F; 7a - 4p)  Please contact [facility] for night-coverage after hours (4p -7a ) and weekends on [email or web address] for all non- LVAD Issues  CXR: Possible right basilar PNA => on vancomycin/Zosyn for HCAP coverage.   Abdominal US: Cirrhotic liver.  Cholelithiasis, some pericholecystic fluid.  Cannot rule out acute cholecystitis.  Common bile duct not dilated, no choledocholithiasis.  Will need HIDA scan.   Keep NPO for now with IV fluid.   Loralie Champagne 02/26/2016 10:32 PM

## 2016-02-27 ENCOUNTER — Inpatient Hospital Stay (HOSPITAL_COMMUNITY): Payer: Non-veteran care

## 2016-02-27 ENCOUNTER — Encounter (HOSPITAL_COMMUNITY): Payer: Self-pay | Admitting: General Surgery

## 2016-02-27 ENCOUNTER — Telehealth: Payer: Self-pay | Admitting: Hematology

## 2016-02-27 DIAGNOSIS — I959 Hypotension, unspecified: Secondary | ICD-10-CM

## 2016-02-27 DIAGNOSIS — I5022 Chronic systolic (congestive) heart failure: Secondary | ICD-10-CM

## 2016-02-27 DIAGNOSIS — Z95811 Presence of heart assist device: Secondary | ICD-10-CM

## 2016-02-27 DIAGNOSIS — K810 Acute cholecystitis: Secondary | ICD-10-CM

## 2016-02-27 LAB — CBC
HEMATOCRIT: 30.4 % — AB (ref 39.0–52.0)
HEMOGLOBIN: 9 g/dL — AB (ref 13.0–17.0)
MCH: 26.9 pg (ref 26.0–34.0)
MCHC: 29.6 g/dL — AB (ref 30.0–36.0)
MCV: 91 fL (ref 78.0–100.0)
Platelets: 130 10*3/uL — ABNORMAL LOW (ref 150–400)
RBC: 3.34 MIL/uL — ABNORMAL LOW (ref 4.22–5.81)
RDW: 18.7 % — AB (ref 11.5–15.5)
WBC: 10.2 10*3/uL (ref 4.0–10.5)

## 2016-02-27 LAB — COMPREHENSIVE METABOLIC PANEL
ALBUMIN: 2.4 g/dL — AB (ref 3.5–5.0)
ALK PHOS: 151 U/L — AB (ref 38–126)
ALT: 204 U/L — AB (ref 17–63)
ANION GAP: 6 (ref 5–15)
AST: 211 U/L — ABNORMAL HIGH (ref 15–41)
BILIRUBIN TOTAL: 7.2 mg/dL — AB (ref 0.3–1.2)
BUN: 28 mg/dL — AB (ref 6–20)
CALCIUM: 8.9 mg/dL (ref 8.9–10.3)
CO2: 25 mmol/L (ref 22–32)
CREATININE: 1.13 mg/dL (ref 0.61–1.24)
Chloride: 105 mmol/L (ref 101–111)
GFR calc Af Amer: >60 mL/min (ref >60)
GFR calc non Af Amer: >60 mL/min (ref >60)
GLUCOSE: 88 mg/dL (ref 65–99)
Potassium: 3.3 mmol/L — ABNORMAL LOW (ref 3.5–5.1)
SODIUM: 136 mmol/L (ref 135–145)
TOTAL PROTEIN: 5.2 g/dL — AB (ref 6.5–8.1)

## 2016-02-27 LAB — PROTIME-INR
INR: 2 — ABNORMAL HIGH (ref 0.00–1.49)
Prothrombin Time: 22.6 seconds — ABNORMAL HIGH (ref 11.6–15.2)

## 2016-02-27 LAB — SURGICAL PCR SCREEN
MRSA, PCR: NEGATIVE
Staphylococcus aureus: NEGATIVE

## 2016-02-27 LAB — LACTATE DEHYDROGENASE: LDH: 230 U/L — AB (ref 98–192)

## 2016-02-27 MED ORDER — PANTOPRAZOLE SODIUM 40 MG IV SOLR
40.0000 mg | Freq: Two times a day (BID) | INTRAVENOUS | Status: DC
  Administered 2016-02-27 – 2016-02-28 (×3): 40 mg via INTRAVENOUS
  Filled 2016-02-27 (×3): qty 40

## 2016-02-27 MED ORDER — SODIUM CHLORIDE 0.9 % IV BOLUS (SEPSIS)
500.0000 mL | Freq: Once | INTRAVENOUS | Status: AC
  Administered 2016-02-27: 500 mL via INTRAVENOUS

## 2016-02-27 MED ORDER — VANCOMYCIN HCL IN DEXTROSE 1-5 GM/200ML-% IV SOLN
1000.0000 mg | Freq: Two times a day (BID) | INTRAVENOUS | Status: DC
  Administered 2016-02-27: 1000 mg via INTRAVENOUS
  Filled 2016-02-27 (×2): qty 200

## 2016-02-27 MED ORDER — FENTANYL CITRATE (PF) 100 MCG/2ML IJ SOLN
25.0000 ug | INTRAMUSCULAR | Status: DC | PRN
  Filled 2016-02-27: qty 2

## 2016-02-27 MED ORDER — SODIUM CHLORIDE 0.9 % IV SOLN
INTRAVENOUS | Status: DC
Start: 2016-02-27 — End: 2016-03-03
  Administered 2016-02-27 – 2016-02-28 (×2): via INTRAVENOUS

## 2016-02-27 MED ORDER — POTASSIUM CHLORIDE CRYS ER 20 MEQ PO TBCR
40.0000 meq | EXTENDED_RELEASE_TABLET | Freq: Once | ORAL | Status: AC
  Administered 2016-02-27: 40 meq via ORAL
  Filled 2016-02-27: qty 2

## 2016-02-27 MED ORDER — TECHNETIUM TC 99M MEBROFENIN IV KIT
5.0000 | PACK | Freq: Once | INTRAVENOUS | Status: AC | PRN
  Administered 2016-02-27: 5 via INTRAVENOUS

## 2016-02-27 NOTE — Evaluation (Signed)
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 Overall he received 6U PRBCs. 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 He has had minimal po intake since discharge due to nausea. He has had right upper quadrant pain.Felt "dizzy" all and fell once with presyncope.   Clinical Impression  Pt admitted with above diagnosis. Patient remains weak from recent admission and is now s/p fall. At baseline, he and his wife describe his gait as very unsteady. Pt currently with functional limitations due to the deficits listed below (see PT Problem List).  Pt may benefit from skilled PT to increase their independence and safety with mobility to allow discharge to the venue listed below.       Follow Up Recommendations No PT follow up;Supervision/Assistance - 24 hour    Equipment Recommendations  None recommended by PT    Recommendations for Other Services OT consult     Precautions / Restrictions Precautions Precautions: Fall      Mobility  Bed Mobility Overal bed mobility: Needs Assistance Bed Mobility: Supine to Sit     Supine to sit: Min assist;HOB elevated     General bed mobility comments: Asked to be "pulled up" and able to do most of the movement himself when encouraged.  Transfers Overall transfer level: Needs assistance Equipment used: 1 person hand held assist Transfers: Sit to/from [facility] Sit to Stand: Min guard;+2 safety/equipment Stand pivot transfers: Min assist;+2 safety/equipment       General transfer comment: Stood at EOB and felt slightly dizzy. Did remain close to bed until he felt he could  safely transition to chair.  Ambulation/Gait Ambulation/Gait assistance: Min assist;+2 safety/equipment Ambulation Distance (Feet): 4 Feet Assistive device: 1 person hand held assist Gait Pattern/deviations: Step-through pattern;Decreased stride length     General Gait Details: limited distance due to pain and generalized fatigue  Stairs            Wheelchair Mobility    Modified Rankin (Stroke Patients Only)       Balance Overall balance assessment: Needs assistance;History of Falls Sitting-balance support: No upper extremity supported;Feet supported Sitting balance-Leahy Scale: Fair     Standing balance support: Single extremity supported Standing balance-Leahy Scale: Poor                               Pertinent Vitals/Pain Pain Assessment: Faces Faces Pain Scale: Hurts even more Pain Location: Rt side Pain Descriptors / Indicators: Aching;Grimacing Pain Intervention(s): Limited activity within patient's tolerance;Monitored during session;Repositioned    Home Living Family/patient expects to be discharged to:: Private residence Living Arrangements: Spouse/significant other Available Help at Discharge: Family;Available 24 hours/day Type of Home: House Home Access: Stairs to enter Entrance Stairs-Rails: Right;Can reach Software engineer of Steps: 4 Home Layout: One level Home Equipment: Walker - 4 wheels;Bedside commode (lift chair (doesn't use lift yet))      Prior Function Level of Independence: Needs assistance   Gait / Transfers Assistance Needed: furniture walks per wife; has uncontrolled "flapping" like movements that worsen when he is fatigued. Wife reports when they start, he has to sit down or he will  fall down.            Hand Dominance        Extremity/Trunk Assessment   Upper Extremity Assessment: Generalized weakness           Lower Extremity Assessment: Generalized weakness      Cervical / Trunk  Assessment: Normal  Communication   Communication: No difficulties  Cognition Arousal/Alertness: Awake/alert Behavior During Therapy: WFL for tasks assessed/performed Overall Cognitive Status: Within Functional Limits for tasks assessed                      General Comments General comments (skin integrity, edema, etc.): wife present     Exercises        Assessment/Plan    PT Assessment Patient needs continued PT services  PT Diagnosis Generalized weakness;Difficulty walking   PT Problem List Decreased strength;Decreased activity tolerance;Decreased balance;Decreased mobility;Decreased knowledge of use of DME;Decreased safety awareness  PT Treatment Interventions DME instruction;Gait training;Stair training;Functional mobility training;Therapeutic activities;Therapeutic exercise;Balance training;Patient/family education   PT Goals (Current goals can be found in the Care Plan section) Acute Rehab PT Goals Patient Stated Goal: return home PT Goal Formulation: With patient/family Time For Goal Achievement: 03/05/16 Potential to Achieve Goals: Good    Frequency Min 3X/week   Barriers to discharge        Co-evaluation               End of Session Equipment Utilized During Treatment: Oxygen Activity Tolerance: Patient limited by fatigue Patient left: in chair;with nursing/sitter in room;with family/visitor present;Other (comment) (wife in room with call bell; pt wanted to sit by window) Nurse Communication: Mobility status (by window per pt request; call bell doesn't reach; wife pres)         Time: [id number] PT Time Calculation (min) (ACUTE ONLY): 29 min   Charges:   PT Evaluation $PT Eval Moderate Complexity: 1 Procedure PT Treatments $Therapeutic Activity: 8-22 mins   PT G Codes:        Jaycey Gens 2016/03/09, 2:22 PM Pager [phone]

## 2016-02-27 NOTE — Telephone Encounter (Signed)
pt wife called to cx 5/31 apt.. pt admitted to [facility] [place]

## 2016-02-27 NOTE — Consult Note (Signed)
Reason for Consult: acute cholecystitis  Referring Physician: Dr. Loralie Champagne   HPI: [name] is a 75 year old pleasant male with a complicated past medical history of PAF on Eliquis, recurrent GI bleeding last being 1 week ago, CAD s/p CABG, sCHF, LVAD, COPD, OSA, HTN, hypothyroidism who developed RUQ abdominal pain on Friday or Saturday.  He denies any previous symptoms.  Associated with nausea and diaphoresis.  Denies fevers or chills.  Worse after oral intake.  Reports dark tarry stools on Saturday, but non since. Abdominal US reveals cirrhosis, cholelithiasis and findings consistent with cholecystitis.  A HIDA scan showed acute cholecystitis.  AST/ALT 204/211, T bilirubin 7.4.  WBC 10.9k, now normal.  Previously INR normal, now 2.  We have been asked to evaluate for cholecystectomy.    Past Medical History  Diagnosis Date  . Ischemic cardiomyopathy      CABG 2003, PCI 2007  EF 27%(myoview 2012  . Chronic systolic heart failure ([place])   . Hyperlipidemia   . Hypothyroidism   . Chronic anticoagulation     Afib and LVAD  . Obesity   . COPD (chronic obstructive pulmonary disease) ([place])   . Asbestosis([place])     "6 years in the [place]" (05/24/2013)  . Atrial fibrillation ([place])     permanent  . Paroxysmal ventricular tachycardia ([place])   . Coronary artery disease   . Hypertension   . Depression   . LVAD (left ventricular assist device) present ([place]) 12/2012  . Epistaxis 11/2015, 07/2014  . Cancer ([place])     "scraped some off behind my left ear; fast moving; having it cut out 12/05/2015" (11/07/2015)  . CHF (congestive heart failure) ([place])   . Myocardial infarction ([place]) 1990's-2000    "2 in  ~ the 1990's; 1 in ~ 2000" (11/07/2015)  . On home oxygen therapy     "have it available; don't use it" (11/07/2015)  . OSA on CPAP   . Anemia   . History of blood transfusion 08/2015 X 10; 11/2015 X 2    "related to bleeding on the inside somewhere" (11/07/2015)  . Complication of anesthesia     "they  can't put me all the way to sleep cause of my heart" (11/07/2015)    Past Surgical History  Procedure Laterality Date  . Coronary artery bypass graft  2003    "?X4" (05/24/2013)  . Cardiac defibrillator placement  2004; ~ 2010    "cut it out in 2016 after it got infected"   . Insertion of implantable left ventricular assist device N/A 01/12/2013    Procedure: INSERTION OF IMPLANTABLE LEFT VENTRICULAR ASSIST DEVICE;  Surgeon: Ivin Poot, MD;  Location: [facility];  Service: Open Heart Surgery;  Laterality: N/A;   nitric oxide; Redo sternotomy  . Intraoperative transesophageal echocardiogram N/A 01/12/2013    Procedure: INTRAOPERATIVE TRANSESOPHAGEAL ECHOCARDIOGRAM;  Surgeon: Ivin Poot, MD;  Location: [facility];  Service: Open Heart Surgery;  Laterality: N/A;  . Colonoscopy N/A 03/09/2014    Procedure: COLONOSCOPY;  Surgeon: Inda Castle, MD;  Location: [facility];  Service: Endoscopy;  Laterality: N/A;  LVAD  patient  . Esophagogastroduodenoscopy N/A 03/09/2014    Procedure: ESOPHAGOGASTRODUODENOSCOPY (EGD);  Surgeon: Inda Castle, MD;  Location: [facility];  Service: Endoscopy;  Laterality: N/A;  . Givens capsule study N/A 03/10/2014    Procedure: GIVENS CAPSULE STUDY;  Surgeon: Inda Castle, MD;  Location: [facility];  Service: Endoscopy;  Laterality: N/A;  . Enteroscopy N/A 07/27/2014  Procedure: ENTEROSCOPY;  Surgeon: Gatha Mayer, MD;  Location: [facility];  Service: Endoscopy;  Laterality: N/A;  LVAD patient   . Colonoscopy N/A 07/29/2014    Procedure: COLONOSCOPY;  Surgeon: Gatha Mayer, MD;  Location: [facility];  Service: Endoscopy;  Laterality: N/A;  . Givens capsule study N/A 07/30/2014    Procedure: GIVENS CAPSULE STUDY;  Surgeon: Gatha Mayer, MD;  Location: [facility];  Service: Endoscopy;  Laterality: N/A;  . Esophagogastroduodenoscopy N/A 08/01/2014    Procedure: ESOPHAGOGASTRODUODENOSCOPY (EGD);  Surgeon: Jerene Bears, MD;  Location: [facility] ENDOSCOPY;   Service: Endoscopy;  Laterality: N/A;  LVAD patient  . Left and right heart catheterization with coronary/graft angiogram  01/07/2013    Procedure: LEFT AND RIGHT HEART CATHETERIZATION WITH Beatrix Fetters;  Surgeon: Jolaine Artist, MD;  Location: [facility] CATH LAB;  Service: Cardiovascular;;  . Enteroscopy N/A 09/27/2014    Procedure: ENTEROSCOPY;  Surgeon: Gatha Mayer, MD;  Location: [facility] ENDOSCOPY;  Service: Endoscopy;  Laterality: N/A;  . Icd lead removal Left 03/16/2015    Procedure: ICD LEAD REMOVAL/EXTRACTION;  Surgeon: Evans Lance, MD;  Location: [facility];  Service: Cardiovascular;  Laterality: Left;  PATIENT HAS LVAD  DR. VAN TRIGT TO BACK UP EXTRACTION  . Appendectomy    . Enteroscopy N/A 11/09/2015    Procedure: ENTEROSCOPY;  Surgeon: Mauri Pole, MD;  Location: [facility] ENDOSCOPY;  Service: Endoscopy;  Laterality: N/A;  LVAD  . Givens capsule study N/A 12/11/2015    Procedure: GIVENS CAPSULE STUDY;  Surgeon: Gatha Mayer, MD;  Location: [facility];  Service: Endoscopy;  Laterality: N/A;  . Enteroscopy N/A 12/13/2015    Procedure: ENTEROSCOPY;  Surgeon: Milus Banister, MD;  Location: [facility];  Service: Endoscopy;  Laterality: N/A;  . Enteroscopy N/A 02/21/2016    Procedure: ENTEROSCOPY;  Surgeon: Milus Banister, MD;  Location: [facility];  Service: Endoscopy;  Laterality: N/A;    Family History  Problem Relation Age of Onset  . Heart attack Mother   . Heart attack Father     Social History:  reports that he quit smoking about 14 years ago. His smoking use included Cigarettes. He has a 90 pack-year smoking history. He has never used smokeless tobacco. He reports that he drinks alcohol. He reports that he does not use illicit drugs.  Allergies: No Known Allergies  Medications:  Scheduled Meds: . citalopram  40 mg Oral QHS  . docusate sodium  200 mg Oral BID  . levothyroxine  100 mcg Oral QHS  . mometasone-formoterol  2 puff Inhalation BID  . pantoprazole  (PROTONIX) IV  40 mg Intravenous Q12H  . piperacillin-tazobactam (ZOSYN)  IV  3.375 g Intravenous Q8H  . tiotropium  18 mcg Inhalation Daily  . vitamin B-12  1,000 mcg Oral QHS   Continuous Infusions: . sodium chloride 100 mL/hr at 02/27/16 1400   PRN Meds:.albuterol, fentaNYL (SUBLIMAZE) injection   Results for orders placed or performed during the hospital encounter of 02/26/16 (from the past 48 hour(s))  Type and screen [place]     Status: None   Collection Time: 02/26/16  6:30 PM  Result Value Ref Range   ABO/RH(D) O POS    Antibody Screen NEG    Sample Expiration 02/29/2016   I-Stat Chem 8, ED     Status: Abnormal   Collection Time: 02/26/16  6:39 PM  Result Value Ref Range   Sodium 137 135 - 145 mmol/L   Potassium  3.4 (L) 3.5 - 5.1 mmol/L   Chloride 99 (L) 101 - 111 mmol/L   BUN 29 (H) 6 - 20 mg/dL   Creatinine, Ser 1.40 (H) 0.61 - 1.24 mg/dL   Glucose, Bld 92 65 - 99 mg/dL   Calcium, Ion 1.23 1.13 - 1.30 mmol/L   TCO2 24 0 - 100 mmol/L   Hemoglobin 10.5 (L) 13.0 - 17.0 g/dL   HCT 31.0 (L) 39.0 - 52.0 %  I-Stat CG4 Lactic Acid, ED     Status: None   Collection Time: 02/26/16  6:40 PM  Result Value Ref Range   Lactic Acid, Venous 1.47 0.5 - 2.0 mmol/L  CBC with Differential     Status: Abnormal   Collection Time: 02/26/16  6:40 PM  Result Value Ref Range   WBC 10.9 (H) 4.0 - 10.5 K/uL   RBC 3.43 (L) 4.22 - 5.81 MIL/uL   Hemoglobin 9.4 (L) 13.0 - 17.0 g/dL   HCT 31.1 (L) 39.0 - 52.0 %   MCV 90.7 78.0 - 100.0 fL   MCH 27.4 26.0 - 34.0 pg   MCHC 30.2 30.0 - 36.0 g/dL   RDW 18.6 (H) 11.5 - 15.5 %   Platelets 137 (L) 150 - 400 K/uL   Neutrophils Relative % 93 %   Neutro Abs 10.1 (H) 1.7 - 7.7 K/uL   Lymphocytes Relative 3 %   Lymphs Abs 0.4 (L) 0.7 - 4.0 K/uL   Monocytes Relative 4 %   Monocytes Absolute 0.4 0.1 - 1.0 K/uL   Eosinophils Relative 0 %   Eosinophils Absolute 0.0 0.0 - 0.7 K/uL   Basophils Relative 0 %   Basophils Absolute 0.0  0.0 - 0.1 K/uL  Comprehensive metabolic panel     Status: Abnormal   Collection Time: 02/26/16  6:40 PM  Result Value Ref Range   Sodium 135 135 - 145 mmol/L   Potassium 3.4 (L) 3.5 - 5.1 mmol/L   Chloride 102 101 - 111 mmol/L   CO2 26 22 - 32 mmol/L   Glucose, Bld 97 65 - 99 mg/dL   BUN 29 (H) 6 - 20 mg/dL   Creatinine, Ser 1.42 (H) 0.61 - 1.24 mg/dL   Calcium 9.2 8.9 - 10.3 mg/dL   Total Protein 5.2 (L) 6.5 - 8.1 g/dL   Albumin 2.7 (L) 3.5 - 5.0 g/dL   AST 275 (H) 15 - 41 U/L   ALT 237 (H) 17 - 63 U/L   Alkaline Phosphatase 159 (H) 38 - 126 U/L   Total Bilirubin 7.4 (H) 0.3 - 1.2 mg/dL   GFR calc non Af Amer 47 (L) >60 mL/min   GFR calc Af Amer 55 (L) >60 mL/min    Comment: (NOTE) The eGFR has been calculated using the CKD EPI equation. This calculation has not been validated in all clinical situations. eGFR's persistently <60 mL/min signify possible Chronic Kidney Disease.    Anion gap 7 5 - 15  Protime-INR     Status: Abnormal   Collection Time: 02/26/16  6:40 PM  Result Value Ref Range   Prothrombin Time 28.6 (H) 11.6 - 15.2 seconds   INR 2.74 (H) 0.00 - 1.49  POC occult blood, ED     Status: Abnormal   Collection Time: 02/26/16  8:27 PM  Result Value Ref Range   Fecal Occult Bld POSITIVE (A) NEGATIVE  Culture, blood (Routine X 2) w Reflex to ID Panel     Status: None (Preliminary result)   Collection Time:  02/26/16  8:40 PM  Result Value Ref Range   Specimen Description BLOOD LEFT ANTECUBITAL    Special Requests BOTTLES DRAWN AEROBIC AND ANAEROBIC 5CC    Culture NO GROWTH < 12 HOURS    Report Status PENDING   Lipase, blood     Status: None   Collection Time: 02/26/16  8:45 PM  Result Value Ref Range   Lipase 22 11 - 51 U/L  Acetaminophen level     Status: Abnormal   Collection Time: 02/26/16  8:45 PM  Result Value Ref Range   Acetaminophen (Tylenol), Serum <10 (L) 10 - 30 ug/mL    Comment:        THERAPEUTIC CONCENTRATIONS VARY SIGNIFICANTLY. A RANGE OF  10-30 ug/mL MAY BE AN EFFECTIVE CONCENTRATION FOR MANY PATIENTS. HOWEVER, SOME ARE BEST TREATED AT CONCENTRATIONS OUTSIDE THIS RANGE. ACETAMINOPHEN CONCENTRATIONS >150 ug/mL AT 4 HOURS AFTER INGESTION AND >50 ug/mL AT 12 HOURS AFTER INGESTION ARE OFTEN ASSOCIATED WITH TOXIC REACTIONS.   I-Stat CG4 Lactic Acid, ED     Status: None   Collection Time: 02/26/16  8:49 PM  Result Value Ref Range   Lactic Acid, Venous 1.65 0.5 - 2.0 mmol/L  Culture, blood (Routine X 2) w Reflex to ID Panel     Status: None (Preliminary result)   Collection Time: 02/26/16  8:49 PM  Result Value Ref Range   Specimen Description BLOOD RIGHT HAND    Special Requests BOTTLES DRAWN AEROBIC ONLY 5CC    Culture NO GROWTH < 12 HOURS    Report Status PENDING   Lactate dehydrogenase     Status: Abnormal   Collection Time: 02/26/16  9:17 PM  Result Value Ref Range   LDH 243 (H) 98 - 192 U/L  Lactate dehydrogenase     Status: Abnormal   Collection Time: 02/27/16  2:41 AM  Result Value Ref Range   LDH 230 (H) 98 - 192 U/L  Protime-INR     Status: Abnormal   Collection Time: 02/27/16  2:41 AM  Result Value Ref Range   Prothrombin Time 22.6 (H) 11.6 - 15.2 seconds   INR 2.00 (H) 0.00 - 1.49  Comprehensive metabolic panel     Status: Abnormal   Collection Time: 02/27/16  2:41 AM  Result Value Ref Range   Sodium 136 135 - 145 mmol/L   Potassium 3.3 (L) 3.5 - 5.1 mmol/L   Chloride 105 101 - 111 mmol/L   CO2 25 22 - 32 mmol/L   Glucose, Bld 88 65 - 99 mg/dL   BUN 28 (H) 6 - 20 mg/dL   Creatinine, Ser 1.13 0.61 - 1.24 mg/dL   Calcium 8.9 8.9 - 10.3 mg/dL   Total Protein 5.2 (L) 6.5 - 8.1 g/dL   Albumin 2.4 (L) 3.5 - 5.0 g/dL   AST 211 (H) 15 - 41 U/L   ALT 204 (H) 17 - 63 U/L   Alkaline Phosphatase 151 (H) 38 - 126 U/L   Total Bilirubin 7.2 (H) 0.3 - 1.2 mg/dL   GFR calc non Af Amer >60 >60 mL/min   GFR calc Af Amer >60 >60 mL/min    Comment: (NOTE) The eGFR has been calculated using the CKD EPI  equation. This calculation has not been validated in all clinical situations. eGFR's persistently <60 mL/min signify possible Chronic Kidney Disease.    Anion gap 6 5 - 15  CBC     Status: Abnormal   Collection Time: 02/27/16  2:41 AM  Result Value  Ref Range   WBC 10.2 4.0 - 10.5 K/uL   RBC 3.34 (L) 4.22 - 5.81 MIL/uL   Hemoglobin 9.0 (L) 13.0 - 17.0 g/dL   HCT 45.1 (L) 10.4 - 75.7 %   MCV 91.0 78.0 - 100.0 fL   MCH 26.9 26.0 - 34.0 pg   MCHC 29.6 (L) 30.0 - 36.0 g/dL   RDW 47.1 (H) 39.3 - 20.4 %   Platelets 130 (L) 150 - 400 K/uL  Surgical pcr screen     Status: None   Collection Time: 02/27/16  5:41 AM  Result Value Ref Range   MRSA, PCR NEGATIVE NEGATIVE   Staphylococcus aureus NEGATIVE NEGATIVE    Comment:        The Xpert SA Assay (FDA approved for NASAL specimens in patients over 23 years of age), is one component of a comprehensive surveillance program.  Test performance has been validated by [facility] for patients greater than or equal to 74 year old. It is not intended to diagnose infection nor to guide or monitor treatment.     Dg Chest 1 View  02/26/2016  CLINICAL DATA:  Pneumonia.  Shortness of breath.  Fall tonight. EXAM: CHEST 1 VIEW COMPARISON:  02/16/2016 FINDINGS: Patient is post median sternotomy. Cardiomegaly is again seen, question mild improvement. Left ventricular assist device again seen. Right infrahilar opacity which may be new from prior. Diaphragmatic calcifications bilaterally. No pulmonary edema. No large pleural effusion. IMPRESSION: 1. Right infrahilar opacity may reflect pneumonia. This is likely new from prior exam. 2. Cardiomegaly, grossly stable or minimally improved. No pulmonary edema. Electronically Signed   By: Rubye Oaks M.D.   On: 02/26/2016 21:14   Nm Hepatobiliary Liver Func  02/27/2016  CLINICAL DATA:  Right upper quadrant pain. EXAM: NUCLEAR MEDICINE HEPATOBILIARY IMAGING TECHNIQUE: Sequential images of the abdomen were  obtained out to 60 minutes following intravenous administration of radiopharmaceutical. RADIOPHARMACEUTICALS:  5 mCi Tc-83m  Choletec IV COMPARISON:  Sonography from yesterday. FINDINGS: There is adequate tracer uptake into the liver. Photopenic area over the left liver correlates with LVAD. Biliary activity passes into small bowel, consistent with patent common bile duct. Gallbladder was not seen at 2 hours. No hypervascular rim sign. These results will be called to the ordering clinician or representative by the Radiologist Assistant, and communication documented in the PACS or zVision Dashboard. IMPRESSION: 1. Findings of cystic duct obstruction, supportive of acute cholecystitis. 2. Patent common bile duct. Electronically Signed   By: Marnee Spring M.D.   On: 02/27/2016 12:26   US Abdomen Limited Ruq  02/26/2016  CLINICAL DATA:  Elevated transaminase level. EXAM: US ABDOMEN LIMITED - RIGHT UPPER QUADRANT COMPARISON:  CT 03/09/2014 FINDINGS: Gallbladder: Distended. Gallstones are noted with a 1.1 cm stone in the gallbladder neck. Mild intraluminal sludge. Gallbladder wall thickness measures 3-6 mm. Small amount of pericholecystic fluid. No sonographic Klutz sign noted by sonographer. Common bile duct: Diameter: 3 mm Liver: No focal lesion identified. Diffusely increased in parenchymal echogenicity. Nodular capsular contours. Normal directional flow in the main portal vein. IMPRESSION: 1. Cirrhotic hepatic morphology with nodular contours and increased echogenicity, similar to prior CT. No focal lesion is seen sonographically. 2. Cholelithiasis. Mild gallbladder distention or wall thickening. Small amount of pericholecystic fluid. Wall thickening and pericholecystic fluid are common findings in chronic liver disease, however acute cholecystitis can have this appearance. Nuclear medicine HIDA scan may be of value based on clinical concern. 3. No biliary dilatation. Electronically Signed   By: Shawna Orleans  Ehinger  M.D.   On: 02/26/2016 21:12    Review of Systems  Constitutional: Positive for fever and diaphoresis. Negative for chills, weight loss and malaise/fatigue.  Eyes: Negative for blurred vision, double vision, photophobia, pain, discharge and redness.  Respiratory: Positive for shortness of breath. Negative for cough, hemoptysis, sputum production and wheezing.   Cardiovascular: Negative for chest pain, palpitations, orthopnea, claudication, leg swelling and PND.  Gastrointestinal: Positive for nausea, abdominal pain, blood in stool and melena. Negative for vomiting, diarrhea and constipation.  Genitourinary: Negative for dysuria, urgency, frequency, hematuria and flank pain.       Dark tea colored urine  Neurological: Negative for dizziness, tingling, tremors, sensory change, speech change, focal weakness, seizures, loss of consciousness and weakness.   Blood pressure 99/59, pulse 89, temperature 98 F (36.7 C), temperature source Oral, resp. rate 21, height 6' (1.829 m), weight 99.9 kg (220 lb 3.8 oz), SpO2 88 %. Physical Exam  Constitutional: He is oriented to person, place, and time. He appears well-developed and well-nourished. No distress.  HENT:  Head: Atraumatic.  Mouth/Throat: No oropharyngeal exudate.  Eyes: Conjunctivae and EOM are normal. Pupils are equal, round, and reactive to light. Right eye exhibits no discharge. Left eye exhibits no discharge. Scleral icterus is present.  Cardiovascular: Normal rate, regular rhythm, normal heart sounds and intact distal pulses.   Respiratory: Effort normal and breath sounds normal. No respiratory distress. He has no wheezes. He has no rales. He exhibits no tenderness.  GI: Soft. Bowel sounds are normal. He exhibits no distension and no mass. There is no rebound and no guarding.    TTP RUQ  Musculoskeletal: Normal range of motion. He exhibits no edema or tenderness.  Neurological: He is alert and oriented to person, place, and time.  Skin:  Skin is dry. No rash noted. He is not diaphoretic. No erythema. No pallor.  Psychiatric: He has a normal mood and affect. His behavior is normal. Thought content normal.    Assessment/Plan: CAD s/p CABG 2003 sCHF s/p ICD Emphysema/OSA S/p LVAD Recurrent GI bleeding  Cirrhosis(child's pugh score 10-C) PAF on Eliquis(none since admission 5/29) Acute calculous cholecystitis   I'm not quite sure the patient can undergo surgery.  He may be too high risk with above problems particularly his cirrhosis and supratherapeutic INR.  An alternative may be a cholecystostomy tube or antibiotics.  Will have to further discuss with Dr. Aundra Dubin.  Continue with Zosyn, I will stop his Vanc.   Thank you for the consult.     Leomar Westberg ANP-BC 02/27/2016, 2:59 PM

## 2016-02-27 NOTE — Progress Notes (Signed)
HeartMate 2 Rounding Note  Subjective:    Admitted with possible sepsis. Started on Vancomycin and zosyn.    Complaining of RLQ tenderness. Has been NPO.    Blood cultures pending    LVAD INTERROGATION:  HeartMate II LVAD:  Flow 5.5 liters/min, speed 9000, power 5.5, P 4.8    Objective:    Vital Signs:   Temp:  [98.1 F (36.7 C)-98.3 F (36.8 C)] 98.3 F (36.8 C) (05/30 0400) Pulse Rate:  [55-84] 60 (05/30 0600) Resp:  [12-28] 25 (05/30 0600) BP: (72-111)/(53-72) 87/67 mmHg (05/30 0400) SpO2:  [89 %-96 %] 96 % (05/30 0600) Weight:  [218 lb 0.6 oz (98.9 kg)-220 lb 3.8 oz (99.9 kg)] 220 lb 3.8 oz (99.9 kg) (05/30 0500) Last BM Date:  (PTA) Mean arterial Pressure  68   Intake/Output:   Intake/Output Summary (Last 24 hours) at 02/27/16 0718 Last data filed at 02/27/16 0600  Gross per 24 hour  Intake   1215 ml  Output      0 ml  Net   1215 ml     Physical Exam: General:  Chronically ill appearing. No resp difficulty. In bed.  HEENT: normal Neck: supple. JVP 7-8 . Carotids 2+ bilat; no bruits. No lymphadenopathy or thryomegaly appreciated. Cor: Mechanical heart sounds with LVAD hum present. Lungs: clear Abdomen: soft, RLQ tender. nondistended. No hepatosplenomegaly. No bruits or masses. Good bowel sounds. Driveline: C/D/I; securement device intact and driveline incorporated Extremities: no cyanosis, clubbing, rash, edema. LUE skin tears x2 Neuro: alert & orientedx3, cranial nerves grossly intact. moves all 4 extremities w/o difficulty. Affect pleasant  Telemetry:NSR 60s   Labs: Basic Metabolic Panel:  Recent Labs Lab 02/20/16 0849 02/21/16 0528 02/22/16 0327 02/22/16 0943 02/26/16 1839 02/26/16 1840 02/27/16 0241  NA 137 138 137  --  137 135 136  K 3.9 4.0 4.2  --  3.4* 3.4* 3.3*  CL 105 104 105  --  99* 102 105  CO2 27 29 27   --   --  26 25  GLUCOSE 124* 129* 121*  --  92 97 88  BUN 27* 22* 20  --  29* 29* 28*  CREATININE 0.94 0.94 1.08  --  1.40*  1.42* 1.13  CALCIUM 9.3 9.2 9.0  --   --  9.2 8.9  MG  --   --   --  1.9  --   --   --     Liver Function Tests:  Recent Labs Lab 02/26/16 1840 02/27/16 0241  AST 275* 211*  ALT 237* 204*  ALKPHOS 159* 151*  BILITOT 7.4* 7.2*  PROT 5.2* 5.2*  ALBUMIN 2.7* 2.4*    Recent Labs Lab 02/26/16 2045  LIPASE 22   No results for input(s): AMMONIA in the last 168 hours.  CBC:  Recent Labs Lab 02/22/16 0327 02/22/16 1610 02/23/16 0643 02/26/16 1839 02/26/16 1840 02/27/16 0241  WBC 7.2 6.2 6.2  --  10.9* 10.2  NEUTROABS  --  5.0  --   --  10.1*  --   HGB 8.8* 9.0* 9.8* 10.5* 9.4* 9.0*  HCT 29.3* 30.5* 33.0* 31.0* 31.1* 30.4*  MCV 99.3 95.9 95.4  --  90.7 91.0  PLT 126* 137* 129*  --  137* 130*    INR:  Recent Labs Lab 02/26/16 1840 02/27/16 0241  INR 2.74* 2.00*    Other results:  EKG:   Imaging: Dg Chest 1 View  02/26/2016  CLINICAL DATA:  Pneumonia.  Shortness of breath.  Fall  tonight. EXAM: CHEST 1 VIEW COMPARISON:  02/16/2016 FINDINGS: Patient is post median sternotomy. Cardiomegaly is again seen, question mild improvement. Left ventricular assist device again seen. Right infrahilar opacity which may be new from prior. Diaphragmatic calcifications bilaterally. No pulmonary edema. No large pleural effusion. IMPRESSION: 1. Right infrahilar opacity may reflect pneumonia. This is likely new from prior exam. 2. Cardiomegaly, grossly stable or minimally improved. No pulmonary edema. Electronically Signed   By: Jeb Levering M.D.   On: 02/26/2016 21:14   US Abdomen Limited Ruq  02/26/2016  CLINICAL DATA:  Elevated transaminase level. EXAM: US ABDOMEN LIMITED - RIGHT UPPER QUADRANT COMPARISON:  CT 03/09/2014 FINDINGS: Gallbladder: Distended. Gallstones are noted with a 1.1 cm stone in the gallbladder neck. Mild intraluminal sludge. Gallbladder wall thickness measures 3-6 mm. Small amount of pericholecystic fluid. No sonographic Shaddix sign noted by sonographer. Common  bile duct: Diameter: 3 mm Liver: No focal lesion identified. Diffusely increased in parenchymal echogenicity. Nodular capsular contours. Normal directional flow in the main portal vein. IMPRESSION: 1. Cirrhotic hepatic morphology with nodular contours and increased echogenicity, similar to prior CT. No focal lesion is seen sonographically. 2. Cholelithiasis. Mild gallbladder distention or wall thickening. Small amount of pericholecystic fluid. Wall thickening and pericholecystic fluid are common findings in chronic liver disease, however acute cholecystitis can have this appearance. Nuclear medicine HIDA scan may be of value based on clinical concern. 3. No biliary dilatation. Electronically Signed   By: Jeb Levering M.D.   On: 02/26/2016 21:12      Medications:     Scheduled Medications: . citalopram  40 mg Oral QHS  . docusate sodium  200 mg Oral BID  . levothyroxine  100 mcg Oral QHS  . mometasone-formoterol  2 puff Inhalation BID  . pantoprazole  40 mg Oral BID  . piperacillin-tazobactam (ZOSYN)  IV  3.375 g Intravenous Q8H  . tiotropium  18 mcg Inhalation Daily  . vancomycin  750 mg Intravenous Q12H  . vitamin B-12  1,000 mcg Oral QHS     Infusions: . 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 Quit smoking 2003. Uses CPAP and O2 every night. He is s/p LVAD HM II implanted 01/12/13 under DT criteria. 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 Tonight, he was admitted with hypotension, RUQ pain, and elevated LFTs.  1. Hypotension: MAP initially in 60s with dizziness. Rose quickly to the 70s with 250 cc bolus IVF. Possible dehydration in setting of poor po intake and nausea for a number  of days. Cannot rule out sepsis => WBCs higher than prior though afebrile. Does not appear to be actively bleeding.  - Will obtain blood cultures, empiric coverage for now with Zosyn/vancomycin.  - BP better after bolus. Will give NS 100 cc/hr x 15 hours => reassess tomorrow.  Hemoglobin ok.   2. 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 Treated with APC and clipping. Had black stool on Saturday but no BM since then. Hemoglobin was 9.8 at last discharge and was 9.0 today. No definite active bleeding.  - Follow hemoglobin closely.  3. GI: Elevated LFTs (transaminases and tbili) with icterus and RUQ tenderness. Concern for gallbladder disease (with common bile  duct obstruction) versus acute viral hepatitis versus shock liver from hypotension. He is not volume overloaded on exam.  - RUQ [place]- Cholelithiasiis. No biliary dilatation. HIDA scan today.  - Sending viral hepatitis labs.  -Hold statin and amiodarone for now with elevated LFTs.  4. Atrial fibrillation: Paroxysmal. Seems to tolerate poorly. At admission, he was in atrial fibrillation but currently in NSR. As above, hold amiodarone for now with very elevated LFTs.  5. LVAD: HeartMate II. VAD parameters all looked ok on interrogation. Off antiicoagulants.  Place SCDs.  6. AKI: Creatinine mildly elevated, likely due to dehydration/hypotension. Hold losartan and Lasix. Continue IV fluids.  7. NSVT: History of NSVT runs. Watch while holding amiodarone.  8. LUE Skin Tear- Continue allevyn foam.   General Surgery consulted.   I reviewed the LVAD parameters from today, and compared the results to the patient's prior recorded data.  No programming changes were made.  The LVAD is functioning within specified parameters.  The patient performs LVAD self-test daily.  LVAD interrogation was negative for any significant power changes, alarms or PI events/speed drops.  LVAD equipment check completed and  is in good working order.  Back-up equipment present.   LVAD education done on emergency procedures and precautions and reviewed exit site care.  Length of Stay: 1  Amy Clegg NP-C  02/27/2016, 7:18 AM  VAD Team --- VAD ISSUES ONLY--- Pager [phone] (7am - 7am)  Advanced Heart Failure Team  Pager [phone] (M-F; 7a - 4p)  Please contact [facility] for night-coverage after hours (4p -7a ) and weekends on [email or web address]   Patient seen and examined with [name], NP. We discussed all aspects of the encounter. I agree with the assessment and plan as stated above.   BP remains low. Presentation concerning for possible acute cholecystitis. Continue fluids. Add neosynephrine for BP support as needed. GSU aware. On zosyn. Volume status ok. VAD parameters stable.   Bensimhon, Daniel,MD 11:14 AM

## 2016-02-27 NOTE — Progress Notes (Signed)
VAD Coordinator Procedure Note:   VAD coordinator paged to accompany pt to Nuclear Medicine for HIDA scan.st Hemodynamics and VAD parameters monitored by me throughout the study. MAPs were obtained with automatic BP cuff and correlated with Doppler modified systolic.     Doppler Auto cuff(MAP): HR: Sat: Flow: PI: Power:     Speed:              10:00  80  80/67 (74)  87 94 5.9 4.2 5.6      9000    Afib with VT 10:15    71/44 (55)  83 97 5.6 4.5 5.3       NS bolus 200 10:30    81/69 (75)  71 98 5.5 4.5 5.5 10:45    79/67 (75)  74 96 5.4 5.0 5.4 11;00    86/71 (9780  60 97 5.5 4.9 5.5       Sinus rhythm 11:15    87/74 (80)  60 99 5.4 5.0 5.4 11:30    83/72 (77)  61 95 5.3 5.0 5.5 12:00    86/64 (72)  63 96 5.5 4.8 5.5 12:30    83/74 (79)  65 95 5.4 4.7 5.4  Pt slept throughout procedure. No c/o pain or discomfort, wants "something to drink".   Patient Disposition: 2H03

## 2016-02-27 NOTE — Progress Notes (Signed)
RT NOTE:  Pt is refusing to wear CPAP tonight. Pt is aware that he can call if he changes his mind. RT will monitor.

## 2016-02-28 ENCOUNTER — Ambulatory Visit: Payer: Medicare Other

## 2016-02-28 ENCOUNTER — Inpatient Hospital Stay (HOSPITAL_COMMUNITY): Payer: Non-veteran care

## 2016-02-28 ENCOUNTER — Ambulatory Visit: Payer: No Typology Code available for payment source

## 2016-02-28 LAB — COMPREHENSIVE METABOLIC PANEL
ALT: 146 U/L — ABNORMAL HIGH (ref 17–63)
AST: 105 U/L — AB (ref 15–41)
Albumin: 2.4 g/dL — ABNORMAL LOW (ref 3.5–5.0)
Alkaline Phosphatase: 188 U/L — ABNORMAL HIGH (ref 38–126)
Anion gap: 5 (ref 5–15)
BUN: 15 mg/dL (ref 6–20)
CHLORIDE: 107 mmol/L (ref 101–111)
CO2: 24 mmol/L (ref 22–32)
Calcium: 9.1 mg/dL (ref 8.9–10.3)
Creatinine, Ser: 0.91 mg/dL (ref 0.61–1.24)
GFR calc Af Amer: >60 mL/min (ref >60)
GLUCOSE: 86 mg/dL (ref 65–99)
POTASSIUM: 3.9 mmol/L (ref 3.5–5.1)
SODIUM: 136 mmol/L (ref 135–145)
Total Bilirubin: 6.4 mg/dL — ABNORMAL HIGH (ref 0.3–1.2)
Total Protein: 5.2 g/dL — ABNORMAL LOW (ref 6.5–8.1)

## 2016-02-28 LAB — HEPATITIS PANEL, ACUTE
HEP A IGM: NEGATIVE
HEP B S AG: NEGATIVE
Hep B C IgM: NEGATIVE

## 2016-02-28 LAB — LACTATE DEHYDROGENASE: LDH: 208 U/L — ABNORMAL HIGH (ref 98–192)

## 2016-02-28 LAB — PROTIME-INR
INR: 1.31 (ref 0.00–1.49)
Prothrombin Time: 16.4 seconds — ABNORMAL HIGH (ref 11.6–15.2)

## 2016-02-28 MED ORDER — CITALOPRAM HYDROBROMIDE 20 MG PO TABS
20.0000 mg | ORAL_TABLET | Freq: Every day | ORAL | Status: DC
  Administered 2016-02-28 – 2016-03-02 (×4): 20 mg via ORAL
  Filled 2016-02-28 (×4): qty 1

## 2016-02-28 MED ORDER — PANTOPRAZOLE SODIUM 40 MG PO TBEC
40.0000 mg | DELAYED_RELEASE_TABLET | Freq: Two times a day (BID) | ORAL | Status: DC
  Administered 2016-02-28 – 2016-03-03 (×8): 40 mg via ORAL
  Filled 2016-02-28 (×8): qty 1

## 2016-02-28 NOTE — Consult Note (Signed)
Chief Complaint: Patient was seen in consultation today for percutaneous cholecystostomy drain placement Chief Complaint  Patient presents with  . Fall  . Weakness   at the request of Dr Greer Pickerel  Referring Physician(s): Dr Greer Pickerel  Supervising Physician: Corrie Mckusick  Patient Status: In-pt   History of Present Illness: [name] is a 75 y.o. male   Significant cardiac hx LVAD recurrent GI bleed PAF----LD Eliquis 7pm 5/29 RUQ pain and nausea x 3 days Acute calculous cholelithiasis Hida scan: IMPRESSION: 1. Findings of cystic duct obstruction, supportive of acute cholecystitis. 2. Patent common bile duct.  Request for percutaneous cholecystomy drain placement - not surgical candidate per Dr Redmond Pulling: Recent hospitalization; increased LFTs; cirrhosis... Imaging has been reviewed with Dr Earleen Newport and approves procedure Will plan for 6/1 am----continue to HOLD Eliquis ** will need to coordinate with RN for LVAD monitor   Past Medical History  Diagnosis Date  . Ischemic cardiomyopathy      CABG 2003, PCI 2007  EF 27%(myoview 2012  . Chronic systolic heart failure ([place])   . Hyperlipidemia   . Hypothyroidism   . Chronic anticoagulation     Afib and LVAD  . Obesity   . COPD (chronic obstructive pulmonary disease) ([place])   . Asbestosis([place])     "6 years in the [place]" (05/24/2013)  . Atrial fibrillation ([place])     permanent  . Paroxysmal ventricular tachycardia ([place])   . Coronary artery disease   . Hypertension   . Depression   . LVAD (left ventricular assist device) present ([place]) 12/2012  . Epistaxis 11/2015, 07/2014  . Cancer ([place])     "scraped some off behind my left ear; fast moving; having it cut out 12/05/2015" (11/07/2015)  . CHF (congestive heart failure) ([place])   . Myocardial infarction ([place]) 1990's-2000    "2 in  ~ the 1990's; 1 in ~ 2000" (11/07/2015)  . On home oxygen therapy     "have it available; don't use it" (11/07/2015)  . OSA on CPAP   .  Anemia   . History of blood transfusion 08/2015 X 10; 11/2015 X 2    "related to bleeding on the inside somewhere" (11/07/2015)  . Complication of anesthesia     "they can't put me all the way to sleep cause of my heart" (11/07/2015)    Past Surgical History  Procedure Laterality Date  . Coronary artery bypass graft  2003    "?X4" (05/24/2013)  . Cardiac defibrillator placement  2004; ~ 2010    "cut it out in 2016 after it got infected"   . Insertion of implantable left ventricular assist device N/A 01/12/2013    Procedure: INSERTION OF IMPLANTABLE LEFT VENTRICULAR ASSIST DEVICE;  Surgeon: Ivin Poot, MD;  Location: [place];  Service: Open Heart Surgery;  Laterality: N/A;   nitric oxide; Redo sternotomy  . Intraoperative transesophageal echocardiogram N/A 01/12/2013    Procedure: INTRAOPERATIVE TRANSESOPHAGEAL ECHOCARDIOGRAM;  Surgeon: Ivin Poot, MD;  Location: [place];  Service: Open Heart Surgery;  Laterality: N/A;  . Colonoscopy N/A 03/09/2014    Procedure: COLONOSCOPY;  Surgeon: Inda Castle, MD;  Location: [place];  Service: Endoscopy;  Laterality: N/A;  LVAD  patient  . Esophagogastroduodenoscopy N/A 03/09/2014    Procedure: ESOPHAGOGASTRODUODENOSCOPY (EGD);  Surgeon: Inda Castle, MD;  Location: [place];  Service: Endoscopy;  Laterality: N/A;  . Givens capsule study N/A 03/10/2014    Procedure: GIVENS CAPSULE STUDY;  Surgeon: Sandy Salaam  Deatra Ina, MD;  Location: [place];  Service: Endoscopy;  Laterality: N/A;  . Enteroscopy N/A 07/27/2014    Procedure: ENTEROSCOPY;  Surgeon: Gatha Mayer, MD;  Location: [facility];  Service: Endoscopy;  Laterality: N/A;  LVAD patient   . Colonoscopy N/A 07/29/2014    Procedure: COLONOSCOPY;  Surgeon: Gatha Mayer, MD;  Location: [facility];  Service: Endoscopy;  Laterality: N/A;  . Givens capsule study N/A 07/30/2014    Procedure: GIVENS CAPSULE STUDY;  Surgeon: Gatha Mayer, MD;  Location: [place];  Service: Endoscopy;   Laterality: N/A;  . Esophagogastroduodenoscopy N/A 08/01/2014    Procedure: ESOPHAGOGASTRODUODENOSCOPY (EGD);  Surgeon: Jerene Bears, MD;  Location: [facility] ENDOSCOPY;  Service: Endoscopy;  Laterality: N/A;  LVAD patient  . Left and right heart catheterization with coronary/graft angiogram  01/07/2013    Procedure: LEFT AND RIGHT HEART CATHETERIZATION WITH Beatrix Fetters;  Surgeon: Jolaine Artist, MD;  Location: [facility] CATH LAB;  Service: Cardiovascular;;  . Enteroscopy N/A 09/27/2014    Procedure: ENTEROSCOPY;  Surgeon: Gatha Mayer, MD;  Location: [facility] ENDOSCOPY;  Service: Endoscopy;  Laterality: N/A;  . Icd lead removal Left 03/16/2015    Procedure: ICD LEAD REMOVAL/EXTRACTION;  Surgeon: Evans Lance, MD;  Location: [place];  Service: Cardiovascular;  Laterality: Left;  PATIENT HAS LVAD  DR. VAN TRIGT TO BACK UP EXTRACTION  . Appendectomy    . Enteroscopy N/A 11/09/2015    Procedure: ENTEROSCOPY;  Surgeon: Mauri Pole, MD;  Location: [facility] ENDOSCOPY;  Service: Endoscopy;  Laterality: N/A;  LVAD  . Givens capsule study N/A 12/11/2015    Procedure: GIVENS CAPSULE STUDY;  Surgeon: Gatha Mayer, MD;  Location: [place];  Service: Endoscopy;  Laterality: N/A;  . Enteroscopy N/A 12/13/2015    Procedure: ENTEROSCOPY;  Surgeon: Milus Banister, MD;  Location: [place];  Service: Endoscopy;  Laterality: N/A;  . Enteroscopy N/A 02/21/2016    Procedure: ENTEROSCOPY;  Surgeon: Milus Banister, MD;  Location: [place];  Service: Endoscopy;  Laterality: N/A;    Allergies: Review of patient's allergies indicates no known allergies.  Medications: Prior to Admission medications   Medication Sig Start Date End Date Taking? Authorizing Provider  albuterol (PROVENTIL) (2.5 MG/3ML) 0.083% nebulizer solution Take 2.5 mg by nebulization every 6 (six) hours as needed for wheezing or shortness of breath.    Historical Provider, MD  amiodarone (PACERONE) 200 MG tablet Take 1 tablet (200 mg  total) by mouth 2 (two) times daily. Take 400 mg (2 tablets) twice daily until 12/17/15, then decrease back to 400 mg (2 tablets) daily. 02/23/16   Amy D Ninfa Meeker, NP  apixaban (ELIQUIS) 5 MG TABS tablet Take 1 tablet (5 mg total) by mouth 2 (two) times daily. 12/25/15   Jolaine Artist, MD  budesonide-formoterol (SYMBICORT) 160-4.5 MCG/ACT inhaler Inhale 2 puffs into the lungs 2 (two) times daily. 01/10/15   Jolaine Artist, MD  cholecalciferol (VITAMIN D) 1000 UNITS tablet Take 1 tablet (1,000 Units total) by mouth 2 (two) times daily. 01/10/15   Jolaine Artist, MD  citalopram (CELEXA) 40 MG tablet Take 1 tablet (40 mg total) by mouth at bedtime. 01/10/15   Jolaine Artist, MD  docusate sodium (COLACE) 100 MG capsule Take 200 mg by mouth 2 (two) times daily.    Historical Provider, MD  furosemide (LASIX) 40 MG tablet Take 40 mg by mouth daily as needed (for weight more than 225 lbs). Reported on 12/21/2015  Historical Provider, MD  hydrocortisone (ANUSOL-HC) 2.5 % rectal cream Place 1 application rectally 2 (two) times daily. Patient taking differently: Place 1 application rectally 2 (two) times daily as needed for hemorrhoids.  11/21/15   Larey Dresser, MD  levothyroxine (SYNTHROID, LEVOTHROID) 100 MCG tablet Take 1 tablet (100 mcg total) by mouth at bedtime. 02/23/16   Amy D Clegg, NP  losartan (COZAAR) 25 MG tablet Take 1 tablet (25 mg total) by mouth daily. 01/23/16   Jolaine Artist, MD  Multiple Vitamin (MULTIVITAMIN WITH MINERALS) TABS tablet Take 1 tablet by mouth daily.    Historical Provider, MD  nitroGLYCERIN (NITROSTAT) 0.4 MG SL tablet Place 0.4 mg under the tongue every 5 (five) minutes as needed for chest pain. Reported on 01/23/2016    Historical Provider, MD  pantoprazole (PROTONIX) 40 MG tablet Take 1 tablet (40 mg total) by mouth 2 (two) times daily. 01/10/15   Jolaine Artist, MD  potassium chloride SA (K-DUR,KLOR-CON) 20 MEQ tablet Take 40 mEq by mouth daily as needed  (with furosemide dose).    Historical Provider, MD  simvastatin (ZOCOR) 80 MG tablet Take 0.5 tablets (40 mg total) by mouth at bedtime. 01/10/15   Jolaine Artist, MD  tiotropium (SPIRIVA) 18 MCG inhalation capsule Place 1 capsule (18 mcg total) into inhaler and inhale daily. 01/10/15   Jolaine Artist, MD  vitamin B-12 (CYANOCOBALAMIN) 1000 MCG tablet Take 1,000 mcg by mouth at bedtime.    Historical Provider, MD  vitamin C (ASCORBIC ACID) 500 MG tablet Take 1 tablet (500 mg total) by mouth 2 (two) times daily. 01/10/15   Jolaine Artist, MD     Family History  Problem Relation Age of Onset  . Heart attack Mother   . Heart attack Father     Social History   Social History  . Marital Status: Married    Spouse Name: N/A  . Number of Children: N/A  . Years of Education: N/A   Social History Main Topics  . Smoking status: Former Smoker -- 2.00 packs/day for 45 years    Types: Cigarettes    Quit date: 11/11/2001  . Smokeless tobacco: Never Used  . Alcohol Use: Yes     Comment: 05/24/2013 "use to drink beer; hardly nothing since 2003; nothing at all since 12/2011 when I got my LVAD  "  . Drug Use: No  . Sexual Activity: No   Other Topics Concern  . None   Social History Narrative    Review of Systems: A 12 point ROS discussed and pertinent positives are indicated in the HPI above.  All other systems are negative.  Review of Systems  Constitutional: Positive for activity change and fatigue. Negative for fever.  Respiratory: Negative for shortness of breath.   Gastrointestinal: Positive for nausea and abdominal pain.  Neurological: Positive for weakness.  Psychiatric/Behavioral: Negative for behavioral problems and confusion.    Vital Signs: BP 90/81 mmHg  Pulse 67  Temp(Src) 98.4 F (36.9 C) (Oral)  Resp 24  Ht 6' (1.829 m)  Wt 219 lb (99.338 kg)  BMI 29.70 kg/m2  SpO2 98%  Physical Exam  Constitutional: He is oriented to person, place, and time.    Cardiovascular: Normal rate and regular rhythm.   Pulmonary/Chest: Effort normal and breath sounds normal.  Abdominal: Soft. Bowel sounds are normal.  Musculoskeletal: Normal range of motion.  Neurological: He is alert and oriented to person, place, and time.  Skin: Skin is warm and  dry.  Psychiatric: He has a normal mood and affect. His behavior is normal. Judgment and thought content normal.  Nursing note and vitals reviewed.   Mallampati Score:  MD Evaluation Airway: WNL Heart: WNL Abdomen: WNL Chest/ Lungs: WNL ASA  Classification: 3 Mallampati/Airway Score: Two  Imaging: Dg Chest 1 View  02/26/2016  CLINICAL DATA:  Pneumonia.  Shortness of breath.  Fall tonight. EXAM: CHEST 1 VIEW COMPARISON:  02/16/2016 FINDINGS: Patient is post median sternotomy. Cardiomegaly is again seen, question mild improvement. Left ventricular assist device again seen. Right infrahilar opacity which may be new from prior. Diaphragmatic calcifications bilaterally. No pulmonary edema. No large pleural effusion. IMPRESSION: 1. Right infrahilar opacity may reflect pneumonia. This is likely new from prior exam. 2. Cardiomegaly, grossly stable or minimally improved. No pulmonary edema. Electronically Signed   By: Jeb Levering M.D.   On: 02/26/2016 21:14   Nm Hepatobiliary Liver Func  02/27/2016  CLINICAL DATA:  Right upper quadrant pain. EXAM: NUCLEAR MEDICINE HEPATOBILIARY IMAGING TECHNIQUE: Sequential images of the abdomen were obtained out to 60 minutes following intravenous administration of radiopharmaceutical. RADIOPHARMACEUTICALS:  5 mCi Tc-40m  Choletec IV COMPARISON:  Sonography from yesterday. FINDINGS: There is adequate tracer uptake into the liver. Photopenic area over the left liver correlates with LVAD. Biliary activity passes into small bowel, consistent with patent common bile duct. Gallbladder was not seen at 2 hours. No hypervascular rim sign. These results will be called to the ordering  clinician or representative by the Radiologist Assistant, and communication documented in the PACS or zVision Dashboard. IMPRESSION: 1. Findings of cystic duct obstruction, supportive of acute cholecystitis. 2. Patent common bile duct. Electronically Signed   By: Monte Fantasia M.D.   On: 02/27/2016 12:26   Dg Chest Portable 1 View  02/16/2016  CLINICAL DATA:  Weakness today no chest pain. EXAM: PORTABLE CHEST 1 VIEW COMPARISON:  4/3 / 17 FINDINGS: Spine wires overlie stable enlarged cardiac silhouette. LVAD device noted. No pulmonary edema. No pneumothorax. No pleural fluid identified. IMPRESSION: No pulmonary edema.  Stable cardiomegaly. Electronically Signed   By: Suzy Bouchard M.D.   On: 02/16/2016 13:55   US Abdomen Limited Ruq  02/26/2016  CLINICAL DATA:  Elevated transaminase level. EXAM: US ABDOMEN LIMITED - RIGHT UPPER QUADRANT COMPARISON:  CT 03/09/2014 FINDINGS: Gallbladder: Distended. Gallstones are noted with a 1.1 cm stone in the gallbladder neck. Mild intraluminal sludge. Gallbladder wall thickness measures 3-6 mm. Small amount of pericholecystic fluid. No sonographic Mccaskill sign noted by sonographer. Common bile duct: Diameter: 3 mm Liver: No focal lesion identified. Diffusely increased in parenchymal echogenicity. Nodular capsular contours. Normal directional flow in the main portal vein. IMPRESSION: 1. Cirrhotic hepatic morphology with nodular contours and increased echogenicity, similar to prior CT. No focal lesion is seen sonographically. 2. Cholelithiasis. Mild gallbladder distention or wall thickening. Small amount of pericholecystic fluid. Wall thickening and pericholecystic fluid are common findings in chronic liver disease, however acute cholecystitis can have this appearance. Nuclear medicine HIDA scan may be of value based on clinical concern. 3. No biliary dilatation. Electronically Signed   By: Jeb Levering M.D.   On: 02/26/2016 21:12    Labs:  CBC:  Recent Labs   02/22/16 1610 02/23/16 0643 02/26/16 1839 02/26/16 1840 02/27/16 0241  WBC 6.2 6.2  --  10.9* 10.2  HGB 9.0* 9.8* 10.5* 9.4* 9.0*  HCT 30.5* 33.0* 31.0* 31.1* 30.4*  PLT 137* 129*  --  137* 130*    COAGS:  Recent  Labs  02/19/16 0318 02/26/16 1840 02/27/16 0241 02/28/16 0355  INR 1.51* 2.74* 2.00* 1.31    BMP:  Recent Labs  02/21/16 0528 02/22/16 0327 02/26/16 1839 02/26/16 1840 02/27/16 0241  NA 138 137 137 135 136  K 4.0 4.2 3.4* 3.4* 3.3*  CL 104 105 99* 102 105  CO2 29 27  --  26 25  GLUCOSE 129* 121* 92 97 88  BUN 22* 20 29* 29* 28*  CALCIUM 9.2 9.0  --  9.2 8.9  CREATININE 0.94 1.08 1.40* 1.42* 1.13  GFRNONAA >60 >60  --  47* >60  GFRAA >60 >60  --  55* >60    LIVER FUNCTION TESTS:  Recent Labs  12/10/15 0521 12/11/15 0216 02/26/16 1840 02/27/16 0241  BILITOT 0.6 0.9 7.4* 7.2*  AST 38 37 275* 211*  ALT 22 22 237* 204*  ALKPHOS 40 34* 159* 151*  PROT 5.5* 5.2* 5.2* 5.2*  ALBUMIN 3.4* 3.0* 2.7* 2.4*    TUMOR MARKERS: No results for input(s): AFPTM, CEA, CA199, CHROMGRNA in the last 8760 hours.  Assessment and Plan:  Cholecystitis RUQ pain; Nausea Not surgical candidate per Dr Redmond Pulling Scheduled for percutaneous chole drain 6/1 (LD Eliquis 5/29 7pm) Risks and Benefits discussed with the patient including, but not limited to bleeding, infection, gallbladder perforation, bile leak, sepsis or even death. All of the patient's questions were answered, patient is agreeable to proceed. Consent signed and in chart.  Will coordinate with RN for LVAD monitoring   Thank you for this interesting consult.  I greatly enjoyed meeting [name] and look forward to participating in their care.  A copy of this report was sent to the requesting provider on this date.  Electronically Signed: Mikle Sternberg A 02/28/2016, 9:00 AM   I spent a total of 40 Minutes    in face to face in clinical consultation, greater than 50% of which was  counseling/coordinating care for perc chole drain

## 2016-02-28 NOTE — Progress Notes (Signed)
Subjective: Feels better. No n/v. Not much abd pain.   Objective: Vital signs in last 24 hours: Temp:  [97.7 F (36.5 C)-99.6 F (37.6 C)] 98.4 F (36.9 C) (05/31 0746) Pulse Rate:  [39-96] 67 (05/31 0600) Resp:  [20-31] 24 (05/31 0746) BP: (57-148)/(38-99) 90/81 mmHg (05/31 0746) SpO2:  [81 %-98 %] 98 % (05/31 0746) Weight:  [99.338 kg (219 lb)] 99.338 kg (219 lb) (05/31 0420) Last BM Date:  (PTA)  Intake/Output from previous day: 05/30 0701 - 05/31 0700 In: 2980 [P.O.:480; I.V.:2150; IV Piggyback:350] Out: 1000 [Urine:1000] Intake/Output this shift: Total I/O In: 100 [I.V.:100] Out: -   Alert, non toxic +icterus Soft, nt, nd; old incisions. LVAD wires  Lab Results:   Recent Labs  02/26/16 1840 02/27/16 0241  WBC 10.9* 10.2  HGB 9.4* 9.0*  HCT 31.1* 30.4*  PLT 137* 130*   Hepatic Function Latest Ref Rng 02/27/2016 02/26/2016 12/11/2015  Total Protein 6.5 - 8.1 g/dL 5.2(L) 5.2(L) 5.2(L)  Albumin 3.5 - 5.0 g/dL 2.4(L) 2.7(L) 3.0(L)  AST 15 - 41 U/L 211(H) 275(H) 37  ALT 17 - 63 U/L 204(H) 237(H) 22  Alk Phosphatase 38 - 126 U/L 151(H) 159(H) 34(L)  Total Bilirubin 0.3 - 1.2 mg/dL 7.2(H) 7.4(H) 0.9     BMET  Recent Labs  02/26/16 1840 02/27/16 0241  NA 135 136  K 3.4* 3.3*  CL 102 105  CO2 26 25  GLUCOSE 97 88  BUN 29* 28*  CREATININE 1.42* 1.13  CALCIUM 9.2 8.9   PT/INR  Recent Labs  02/27/16 0241 02/28/16 0355  LABPROT 22.6* 16.4*  INR 2.00* 1.31   ABG No results for input(s): PHART, HCO3 in the last 72 hours.  Invalid input(s): PCO2, PO2  Studies/Results: Dg Chest 1 View  02/26/2016  CLINICAL DATA:  Pneumonia.  Shortness of breath.  Fall tonight. EXAM: CHEST 1 VIEW COMPARISON:  02/16/2016 FINDINGS: Patient is post median sternotomy. Cardiomegaly is again seen, question mild improvement. Left ventricular assist device again seen. Right infrahilar opacity which may be new from prior. Diaphragmatic calcifications bilaterally. No  pulmonary edema. No large pleural effusion. IMPRESSION: 1. Right infrahilar opacity may reflect pneumonia. This is likely new from prior exam. 2. Cardiomegaly, grossly stable or minimally improved. No pulmonary edema. Electronically Signed   By: Jeb Levering M.D.   On: 02/26/2016 21:14   Nm Hepatobiliary Liver Func  02/27/2016  CLINICAL DATA:  Right upper quadrant pain. EXAM: NUCLEAR MEDICINE HEPATOBILIARY IMAGING TECHNIQUE: Sequential images of the abdomen were obtained out to 60 minutes following intravenous administration of radiopharmaceutical. RADIOPHARMACEUTICALS:  5 mCi Tc-35m Choletec IV COMPARISON:  Sonography from yesterday. FINDINGS: There is adequate tracer uptake into the liver. Photopenic area over the left liver correlates with LVAD. Biliary activity passes into small bowel, consistent with patent common bile duct. Gallbladder was not seen at 2 hours. No hypervascular rim sign. These results will be called to the ordering clinician or representative by the Radiologist Assistant, and communication documented in the PACS or zVision Dashboard. IMPRESSION: 1. Findings of cystic duct obstruction, supportive of acute cholecystitis. 2. Patent common bile duct. Electronically Signed   By: JMonte FantasiaM.D.   On: 02/27/2016 12:26   [place]dom[place] Limited Ruq  02/26/2016  CLINICAL DATA:  Elevated transaminase level. EXAM: [place]DOM[place] LIMITED - RIGHT UPPER QUADRANT COMPARISON:  CT 03/09/2014 FINDINGS: Gallbladder: Distended. Gallstones are noted with a 1.1 cm stone in the gallbladder neck. Mild intraluminal sludge. Gallbladder wall thickness measures 3-6 mm.  Small amount of pericholecystic fluid. No sonographic Mccrumb sign noted by sonographer. Common bile duct: Diameter: 3 mm Liver: No focal lesion identified. Diffusely increased in parenchymal echogenicity. Nodular capsular contours. Normal directional flow in the main portal vein. IMPRESSION: 1. Cirrhotic hepatic morphology with nodular contours and  increased echogenicity, similar to prior CT. No focal lesion is seen sonographically. 2. Cholelithiasis. Mild gallbladder distention or wall thickening. Small amount of pericholecystic fluid. Wall thickening and pericholecystic fluid are common findings in chronic liver disease, however acute cholecystitis can have this appearance. Nuclear medicine HIDA scan may be of value based on clinical concern. 3. No biliary dilatation. Electronically Signed   By: Jeb Levering M.D.   On: 02/26/2016 21:12    Anti-infectives: Anti-infectives    Start     Dose/Rate Route Frequency Ordered Stop   02/27/16 1000  vancomycin (VANCOCIN) IVPB 1000 mg/200 mL premix  Status:  Discontinued     1,000 mg 200 mL/hr over 60 Minutes Intravenous Every 12 hours 02/27/16 0729 02/27/16 1516   02/27/16 0800  vancomycin (VANCOCIN) IVPB 750 mg/150 ml premix  Status:  Discontinued     750 mg 150 mL/hr over 60 Minutes Intravenous Every 12 hours 02/26/16 2100 02/27/16 0729   02/27/16 0500  piperacillin-tazobactam (ZOSYN) IVPB 3.375 g     3.375 g 12.5 mL/hr over 240 Minutes Intravenous Every 8 hours 02/26/16 2100     02/26/16 2100  vancomycin (VANCOCIN) 1,500 mg in sodium chloride 0.9 % 500 mL IVPB     1,500 mg 250 mL/hr over 120 Minutes Intravenous  Once 02/26/16 2057 02/26/16 2319   02/26/16 2100  piperacillin-tazobactam (ZOSYN) IVPB 3.375 g     3.375 g 100 mL/hr over 30 Minutes Intravenous  Once 02/26/16 2057 02/26/16 2149      Assessment/Plan: CAD s/p CABG 2003 sCHF s/p ICD Emphysema/OSA S/p LVAD Recurrent GI bleeding  Cirrhosis(child's pugh score 10-C) PAF on Eliquis(none since admission 5/29) Acute calculous cholecystitis   INR better today along with some transaminases but bili remains >7. Not clear if hyperbilirubinemia is due to cholecystitis or evolution of his cirrhosis.   Even though pt is feeling better would still rec proceeding with IR cholecystostomy tube to decompress gallbladder and hopefully his  LFTs will normalize.   He is NOT a surgical candidate for cholecystectomy during this admission given reasons discussed in yesterday's note.   rediscussed rationale with drain and typical drain course with pt and wife.   NPO for possible drain placement. Order placed late yesterday.   Leighton Ruff. Redmond Pulling, MD, FACS General, Bariatric, & Minimally Invasive Surgery [facility], [place]   LOS: 2 days    Gayland Curry 02/28/2016

## 2016-02-28 NOTE — Progress Notes (Signed)
HeartMate 2 Rounding Note  Subjective:    Admitted with possible sepsis. Started on Vancomycin and zosyn.    HIDA scan--. Acute cholecystitis. Plan for tube today.   Blood cultures - NGTD.     LVAD INTERROGATION:  HeartMate II LVAD:  Flow 5.1 liters/min, speed 9000, power 5, PI 5.4 4.8    Objective:    Vital Signs:   Temp:  [97.7 F (36.5 C)-99.6 F (37.6 C)] 98.4 F (36.9 C) (05/31 0746) Pulse Rate:  [39-96] 67 (05/31 0600) Resp:  [20-31] 24 (05/31 0746) BP: (57-148)/(38-99) 90/81 mmHg (05/31 0746) SpO2:  [81 %-98 %] 98 % (05/31 0844) Weight:  [219 lb (99.338 kg)] 219 lb (99.338 kg) (05/31 0420) Last BM Date:  (PTA) Mean arterial Pressure  80s   Intake/Output:   Intake/Output Summary (Last 24 hours) at 02/28/16 0855 Last data filed at 02/28/16 0800  Gross per 24 hour  Intake   2980 ml  Output   1000 ml  Net   1980 ml     Physical Exam: General:  Chronically ill appearing. No resp difficulty. In bed.  HEENT: normal Neck: supple. JVP 7-8 . Carotids 2+ bilat; no bruits. No lymphadenopathy or thryomegaly appreciated. Cor: Mechanical heart sounds with LVAD hum present. Lungs: clear Abdomen: soft, RLQ tender. nondistended. No hepatosplenomegaly. No bruits or masses. Good bowel sounds. Driveline: C/D/I; securement device intact and driveline incorporated Extremities: no cyanosis, clubbing, rash, edema. LUE skin tears x2 Neuro: alert & orientedx3, cranial nerves grossly intact. moves all 4 extremities w/o difficulty. Affect pleasant  Telemetry:NSR 60s   Labs: Basic Metabolic Panel:  Recent Labs Lab 02/22/16 0327 02/22/16 0943 02/26/16 1839 02/26/16 1840 02/27/16 0241  NA 137  --  137 135 136  K 4.2  --  3.4* 3.4* 3.3*  CL 105  --  99* 102 105  CO2 27  --   --  26 25  GLUCOSE 121*  --  92 97 88  BUN 20  --  29* 29* 28*  CREATININE 1.08  --  1.40* 1.42* 1.13  CALCIUM 9.0  --   --  9.2 8.9  MG  --  1.9  --   --   --     Liver Function Tests:  Recent  Labs Lab 02/26/16 1840 02/27/16 0241  AST 275* 211*  ALT 237* 204*  ALKPHOS 159* 151*  BILITOT 7.4* 7.2*  PROT 5.2* 5.2*  ALBUMIN 2.7* 2.4*    Recent Labs Lab 02/26/16 2045  LIPASE 22   No results for input(s): AMMONIA in the last 168 hours.  CBC:  Recent Labs Lab 02/22/16 0327 02/22/16 1610 02/23/16 0643 02/26/16 1839 02/26/16 1840 02/27/16 0241  WBC 7.2 6.2 6.2  --  10.9* 10.2  NEUTROABS  --  5.0  --   --  10.1*  --   HGB 8.8* 9.0* 9.8* 10.5* 9.4* 9.0*  HCT 29.3* 30.5* 33.0* 31.0* 31.1* 30.4*  MCV 99.3 95.9 95.4  --  90.7 91.0  PLT 126* 137* 129*  --  137* 130*    INR:  Recent Labs Lab 02/26/16 1840 02/27/16 0241 02/28/16 0355  INR 2.74* 2.00* 1.31    Other results:  EKG:   Imaging: Dg Chest 1 View  02/26/2016  CLINICAL DATA:  Pneumonia.  Shortness of breath.  Fall tonight. EXAM: CHEST 1 VIEW COMPARISON:  02/16/2016 FINDINGS: Patient is post median sternotomy. Cardiomegaly is again seen, question mild improvement. Left ventricular assist device again seen. Right infrahilar opacity which may  be new from prior. Diaphragmatic calcifications bilaterally. No pulmonary edema. No large pleural effusion. IMPRESSION: 1. Right infrahilar opacity may reflect pneumonia. This is likely new from prior exam. 2. Cardiomegaly, grossly stable or minimally improved. No pulmonary edema. Electronically Signed   By: Jeb Levering M.D.   On: 02/26/2016 21:14   Nm Hepatobiliary Liver Func  02/27/2016  CLINICAL DATA:  Right upper quadrant pain. EXAM: NUCLEAR MEDICINE HEPATOBILIARY IMAGING TECHNIQUE: Sequential images of the abdomen were obtained out to 60 minutes following intravenous administration of radiopharmaceutical. RADIOPHARMACEUTICALS:  5 mCi Tc-26m  Choletec IV COMPARISON:  Sonography from yesterday. FINDINGS: There is adequate tracer uptake into the liver. Photopenic area over the left liver correlates with LVAD. Biliary activity passes into small bowel, consistent with  patent common bile duct. Gallbladder was not seen at 2 hours. No hypervascular rim sign. These results will be called to the ordering clinician or representative by the Radiologist Assistant, and communication documented in the PACS or zVision Dashboard. IMPRESSION: 1. Findings of cystic duct obstruction, supportive of acute cholecystitis. 2. Patent common bile duct. Electronically Signed   By: Monte Fantasia M.D.   On: 02/27/2016 12:26   US Abdomen Limited Ruq  02/26/2016  CLINICAL DATA:  Elevated transaminase level. EXAM: US ABDOMEN LIMITED - RIGHT UPPER QUADRANT COMPARISON:  CT 03/09/2014 FINDINGS: Gallbladder: Distended. Gallstones are noted with a 1.1 cm stone in the gallbladder neck. Mild intraluminal sludge. Gallbladder wall thickness measures 3-6 mm. Small amount of pericholecystic fluid. No sonographic Lieb sign noted by sonographer. Common bile duct: Diameter: 3 mm Liver: No focal lesion identified. Diffusely increased in parenchymal echogenicity. Nodular capsular contours. Normal directional flow in the main portal vein. IMPRESSION: 1. Cirrhotic hepatic morphology with nodular contours and increased echogenicity, similar to prior CT. No focal lesion is seen sonographically. 2. Cholelithiasis. Mild gallbladder distention or wall thickening. Small amount of pericholecystic fluid. Wall thickening and pericholecystic fluid are common findings in chronic liver disease, however acute cholecystitis can have this appearance. Nuclear medicine HIDA scan may be of value based on clinical concern. 3. No biliary dilatation. Electronically Signed   By: Jeb Levering M.D.   On: 02/26/2016 21:12     Medications:     Scheduled Medications: . citalopram  40 mg Oral QHS  . docusate sodium  200 mg Oral BID  . levothyroxine  100 mcg Oral QHS  . mometasone-formoterol  2 puff Inhalation BID  . pantoprazole (PROTONIX) IV  40 mg Intravenous Q12H  . piperacillin-tazobactam (ZOSYN)  IV  3.375 g Intravenous Q8H   . tiotropium  18 mcg Inhalation Daily  . vitamin B-12  1,000 mcg Oral QHS    Infusions: . 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 Quit smoking 2003. Uses CPAP and O2 every night. He is s/p LVAD HM II implanted 01/12/13 under DT criteria. 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 Tonight, he was admitted with hypotension, RUQ pain, and elevated LFTs.  1. Hypotension: MAP initially in 60s with dizziness. Rose quickly to the 70s with 250 cc bolus IVF. Possible dehydration in setting of poor po intake and nausea for a number of days. Cannot rule out sepsis => WBCs higher than prior though afebrile. Does  not appear to be actively bleeding.  - Blood cultures NGTD.  Cover with Zosyn - Hemoglobin ok.   2. 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 Treated with APC and clipping. Had black stool on Saturday but no BM since then. Hemoglobin was 9.8 at last discharge. No definite active bleeding.  - Follow hemoglobin closely.  3. GI: Elevated LFTs (transaminases and tbili) with icterus and RUQ tenderness. Concern for gallbladder disease (with common bile duct obstruction) versus acute viral hepatitis versus shock liver from hypotension. He is not volume overloaded on exam.  - RUQ [place]- Acute Cholelithiasiis. Planning for cholcystostomy tube today. Holding eliquis.  - Hepatitis panel negative. .  -Hold statin and amiodarone for now with elevated LFTs. LFTs coming down.  4. Atrial fibrillation: Paroxysmal. Seems to tolerate poorly. At admission, he was in atrial fibrillation but currently in NSR. As above, hold amiodarone for now with  very elevated LFTs.  LFTs coming down.  5. LVAD: HeartMate II. VAD parameters all looked ok on interrogation. Off antiicoagulants.  Place SCDs.  6. AKI: Creatinine mildly elevated, likely due to dehydration/hypotension. Hold losartan and Lasix.  7. NSVT: History of NSVT runs. Watch while holding amiodarone.  8. LUE Skin Tear- Continue allevyn foam.   General Surgery evaluated. IR consulted today for tube.  I reviewed the LVAD parameters from today, and compared the results to the patient's prior recorded data.  No programming changes were made.  The LVAD is functioning within specified parameters.  The patient performs LVAD self-test daily.  LVAD interrogation was negative for any significant power changes, alarms or PI events/speed drops.  LVAD equipment check completed and is in good working order.  Back-up equipment present.   LVAD education done on emergency procedures and precautions and reviewed exit site care.  Length of Stay: 2  Amy Clegg NP-C  02/28/2016, 8:55 AM  VAD Team --- VAD ISSUES ONLY--- Pager [phone] (7am - 7am)  Advanced Heart Failure Team  Pager [phone] (M-F; 7a - 4p)  Please contact [facility] for night-coverage after hours (4p -7a ) and weekends on [email or web address]  Patient seen and examined with [name], NP. We discussed all aspects of the encounter. I agree with the assessment and plan as stated above.   Long talk about options for management of acute cholecysttis. Given recent GI bleed and debility I favor proceeding with perc C-tube now and then following up with surgical cholecystectomy in several weeks. He agrees. HF currently stable. Hgb stable. Continue Zosyn. VAD parameters ok.   Bensimhon, Daniel,MD 11:41 PM

## 2016-02-28 NOTE — Care Management Important Message (Signed)
Important Message  Patient Details  Name: [name] MRN: [id number] Date of Birth: January 08, 1941   Medicare Important Message Given:  Yes    Loann Quill 02/28/2016, 9:17 AM

## 2016-02-28 NOTE — Progress Notes (Signed)
RT NOTE:  Pt refuses to wear CPAP

## 2016-02-28 NOTE — Progress Notes (Signed)
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 Overall he received 6U PRBCs. 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 He has had minimal po intake since discharge due to nausea. He has had right upper quadrant pain.Felt "dizzy" and fell once with presyncope. Hgb down. HIDA scan with +cholecystitis. To have cholecystostomy drain    PT Comments    Session limited. Patient refusing to attempt ambulation due to "too weak." Agreed to exercises (see below). Provided minimal resistance to legs for strengthening and noted pt began to have tremulous movements bil legs. He reports legs begin to tremble when he gets weak, and if standing it progresses to full body tremors (including torso, head, arms per wife) and he will fall if he doesn't quickly sit down. He states this began before his LVAD and has progressively worsened since. Patient reports MD team aware, and cause for tremors is unknown.   Follow Up Recommendations  No PT follow up;Supervision/Assistance - 24 hour     Equipment Recommendations   (?wheelchair vs transport chair (to be discussed with pt/wife)    Recommendations for Other Services OT consult     Precautions / Restrictions Precautions Precautions: Fall Precaution Comments: pt reports legs begins to tremor, progresses to full body, and falls (eitology unknown) Restrictions Weight Bearing Restrictions: No    Mobility  Bed Mobility               General bed mobility comments: up in chair  Transfers                    Ambulation/Gait             General Gait Details: refused; "too much on my mind" (to  have cholecystostomy drain today)   Stairs            Wheelchair Mobility    Modified Rankin (Stroke Patients Only)       Balance                                    Cognition Arousal/Alertness: Awake/alert Behavior During Therapy: WFL for tasks assessed/performed Overall Cognitive Status: Within Functional Limits for tasks assessed                      Exercises General Exercises - Lower Extremity Long Arc Quad: AROM;Strengthening;Both;5 reps;Seated;Limitations Long [employer] Limitations: manual resistance with LE tremors beginning after 3 reps Toe Raises: AROM;Both;10 reps;Seated Heel Raises: AROM;Both;10 reps;Seated   Patient deferred further leg exercises due to tremors. UE exercises deferred as noted LUE bandage soaked through with blood--RN informed. Wife reports his arm has been bleeding since his fall just prior to admission.     General Comments        Pertinent Vitals/Pain Pain Assessment: Faces Faces Pain Scale: Hurts little more Pain Location: Rt side Pain Descriptors / Indicators: Aching Pain Intervention(s): Limited activity within patient's tolerance;Monitored during session    Home Living                      Prior Function  PT Goals (current goals can now be found in the care plan section) Acute Rehab PT Goals Patient Stated Goal: return home Time For Goal Achievement: 03/05/16 Progress towards PT goals: Not progressing toward goals - comment (limited session)    Frequency  Min 3X/week    PT Plan Current plan remains appropriate    Co-evaluation             End of Session   Activity Tolerance: Patient limited by fatigue;Other (comment) (LUE bleeding through bandage therefore UE ex's deferred) Patient left: in chair;with call bell/phone within reach;with family/visitor present     Time: [id number] PT Time Calculation (min) (ACUTE ONLY): 11 min  Charges:  $Therapeutic Exercise: 8-22  mins                    G Codes:      [name] 03/15/2016, 9:55 AM Pager [phone]

## 2016-02-29 ENCOUNTER — Other Ambulatory Visit (HOSPITAL_COMMUNITY): Payer: Medicare Other

## 2016-02-29 ENCOUNTER — Encounter (HOSPITAL_COMMUNITY): Payer: No Typology Code available for payment source

## 2016-02-29 ENCOUNTER — Inpatient Hospital Stay (HOSPITAL_COMMUNITY): Payer: Non-veteran care

## 2016-02-29 DIAGNOSIS — R6521 Severe sepsis with septic shock: Secondary | ICD-10-CM

## 2016-02-29 DIAGNOSIS — A419 Sepsis, unspecified organism: Principal | ICD-10-CM

## 2016-02-29 LAB — PROTIME-INR
INR: 1.29 (ref 0.00–1.49)
Prothrombin Time: 16.3 seconds — ABNORMAL HIGH (ref 11.6–15.2)

## 2016-02-29 LAB — GRAM STAIN

## 2016-02-29 LAB — CBC
HEMATOCRIT: 29.9 % — AB (ref 39.0–52.0)
HEMOGLOBIN: 8.9 g/dL — AB (ref 13.0–17.0)
MCH: 26.9 pg (ref 26.0–34.0)
MCHC: 29.8 g/dL — ABNORMAL LOW (ref 30.0–36.0)
MCV: 90.3 fL (ref 78.0–100.0)
Platelets: 135 10*3/uL — ABNORMAL LOW (ref 150–400)
RBC: 3.31 MIL/uL — ABNORMAL LOW (ref 4.22–5.81)
RDW: 19 % — AB (ref 11.5–15.5)
WBC: 4.7 10*3/uL (ref 4.0–10.5)

## 2016-02-29 LAB — LACTATE DEHYDROGENASE: LDH: 191 U/L (ref 98–192)

## 2016-02-29 MED ORDER — FENTANYL CITRATE (PF) 100 MCG/2ML IJ SOLN
INTRAMUSCULAR | Status: AC | PRN
  Administered 2016-02-29: 25 ug via INTRAVENOUS

## 2016-02-29 MED ORDER — MIDAZOLAM HCL 2 MG/2ML IJ SOLN
INTRAMUSCULAR | Status: AC
  Filled 2016-02-29: qty 2

## 2016-02-29 MED ORDER — FENTANYL CITRATE (PF) 100 MCG/2ML IJ SOLN
INTRAMUSCULAR | Status: AC
  Filled 2016-02-29: qty 2

## 2016-02-29 MED ORDER — IOPAMIDOL (ISOVUE-300) INJECTION 61%
INTRAVENOUS | Status: AC
  Administered 2016-02-29: 10 mL
  Filled 2016-02-29: qty 50

## 2016-02-29 MED ORDER — LIDOCAINE HCL 1 % IJ SOLN
INTRAMUSCULAR | Status: AC
  Administered 2016-02-29: 10 mL
  Filled 2016-02-29: qty 20

## 2016-02-29 MED ORDER — MIDAZOLAM HCL 2 MG/2ML IJ SOLN
INTRAMUSCULAR | Status: AC | PRN
  Administered 2016-02-29: 1 mg via INTRAVENOUS

## 2016-02-29 NOTE — Procedures (Signed)
Placement of percutaneous cholecystostomy tube via transhepatic approach with [place] and fluoro guidance.  Removed 75 ml of dark bile, will send for culture.  No immediate complication and minimal blood loss.

## 2016-02-29 NOTE — Progress Notes (Signed)
Paged from echo re: ordered RAMP echo. Unaware of this plan and no immediate need for a RAMP speed study per Dr. Haroldine Laws.   Clarified with Dr. Prescott Gum re: echo order and he would like to have complete echo evaluation without speed assessment for pre-surgical evaluation in the event Hailey needs surgery. Orders modified to reflect this and called echo to update.   Janene Madeira, RN VAD Coordinator   Office: [phone] 24/7 VAD Pager: [phone]

## 2016-02-29 NOTE — Sedation Documentation (Signed)
Pt is resting comfortably at this time. Pt denies pain. LVAD nurse at bedside.

## 2016-02-29 NOTE — Progress Notes (Signed)
Received a call from lab. reporting that bile culture is + gram neg. rod. MD aware  claimed to continue with zosyn.

## 2016-02-29 NOTE — Sedation Documentation (Signed)
Patient is resting comfortably. 

## 2016-02-29 NOTE — Progress Notes (Signed)
[facility] Progress Note     Subjective: Pt laying in bed NAD, just got back from IR. Wife in the room. PERC drain in place. Denies abdominal pain. Discussed drain function and 6 week duration. Denies fever, chills, nausea, emesis overnight. Last BM this AM was formed and dark.   Objective: Vital signs in last 24 hours: Temp:  [97.3 F (36.3 C)-98.6 F (37 C)] 97.8 F (36.6 C) (06/01 1137) Pulse Rate:  [62-86] 77 (06/01 1137) Resp:  [18-29] 20 (06/01 1137) BP: (82-117)/(65-86) 94/79 mmHg (06/01 1137) SpO2:  [92 %-100 %] 95 % (06/01 1137) Weight:  [100.2 kg (220 lb 14.4 oz)] 100.2 kg (220 lb 14.4 oz) (06/01 0328) Last BM Date:  (PTA)  Intake/Output from previous day: 05/31 0701 - 06/01 0700 In: 2700 [P.O.:400; I.V.:2200; IV Piggyback:100] Out: 1200 [Urine:1200] Intake/Output this shift: Total I/O In: 400 [I.V.:400] Out: 250 [Urine:250]  PE: Gen:  Alert, NAD, pleasant Head: scleral icterus present Abd: Soft, NT/ND, +BS, RUQ PERC drain in place, LVAD wires present.  Lab Results:   Recent Labs  02/27/16 0241 02/29/16 0438  WBC 10.2 4.7  HGB 9.0* 8.9*  HCT 30.4* 29.9*  PLT 130* 135*   BMET  Recent Labs  02/27/16 0241 02/28/16 0938  NA 136 136  K 3.3* 3.9  CL 105 107  CO2 25 24  GLUCOSE 88 86  BUN 28* 15  CREATININE 1.13 0.91  CALCIUM 8.9 9.1   PT/INR  Recent Labs  02/28/16 0355 02/29/16 0438  LABPROT 16.4* 16.3*  INR 1.31 1.29   CMP     Component Value Date/Time   NA 136 02/28/2016 0938   NA 141 11/01/2014 1545   K 3.9 02/28/2016 0938   K 4.1 11/01/2014 1545   CL 107 02/28/2016 0938   CO2 24 02/28/2016 0938   CO2 24 11/01/2014 1545   GLUCOSE 86 02/28/2016 0938   GLUCOSE 94 11/01/2014 1545   BUN 15 02/28/2016 0938   BUN 15.2 11/01/2014 1545   CREATININE 0.91 02/28/2016 0938   CREATININE 0.9 11/01/2014 1545   CALCIUM 9.1 02/28/2016 0938   CALCIUM 9.0 11/01/2014 1545   PROT 5.2* 02/28/2016 0938   PROT 6.7 11/01/2014 1545   ALBUMIN 2.4* 02/28/2016 0938   ALBUMIN 3.8 11/01/2014 1545   AST 105* 02/28/2016 0938   AST 34 11/01/2014 1545   ALT 146* 02/28/2016 0938   ALT 19 11/01/2014 1545   ALKPHOS 188* 02/28/2016 0938   ALKPHOS 66 11/01/2014 1545   BILITOT 6.4* 02/28/2016 0938   BILITOT 0.52 11/01/2014 1545   GFRNONAA >60 02/28/2016 0938   GFRAA >60 02/28/2016 0938   Lipase     Component Value Date/Time   LIPASE 22 02/26/2016 2045       Studies/Results: Nm Hepatobiliary Liver Func  02/27/2016  CLINICAL DATA:  Right upper quadrant pain. EXAM: NUCLEAR MEDICINE HEPATOBILIARY IMAGING TECHNIQUE: Sequential images of the abdomen were obtained out to 60 minutes following intravenous administration of radiopharmaceutical. RADIOPHARMACEUTICALS:  5 mCi Tc-25m  Choletec IV COMPARISON:  Sonography from yesterday. FINDINGS: There is adequate tracer uptake into the liver. Photopenic area over the left liver correlates with LVAD. Biliary activity passes into small bowel, consistent with patent common bile duct. Gallbladder was not seen at 2 hours. No hypervascular rim sign. These results will be called to the ordering clinician or representative by the Radiologist Assistant, and communication documented in the PACS or zVision Dashboard. IMPRESSION: 1. Findings of cystic duct obstruction, supportive of acute cholecystitis.  2. Patent common bile duct. Electronically Signed   By: Monte Fantasia M.D.   On: 02/27/2016 12:26   Ir Perc Cholecystostomy  02/29/2016  INDICATION: 75 year old with acute cholecystitis and multiple medical problems, including ischemic cardiomyopathy with a LVAD. Patient is not a surgical candidate. EXAM: PERCUTANEOUS CHOLECYSTOSTOMY TUBE PLACEMENT WITH ULTRASOUND AND FLUOROSCOPIC GUIDANCE MEDICATIONS: None ANESTHESIA/SEDATION: Moderate (conscious) sedation was employed during this procedure. A total of Versed 1.0 mg and Fentanyl 25 mcg was administered intravenously. Moderate Sedation Time: 20 minutes. The  patient's level of consciousness and vital signs were monitored continuously by radiology nursing throughout the procedure under my direct supervision. FLUOROSCOPY TIME:  Fluoroscopy Time: 42 seconds, 21 mGy COMPLICATIONS: None immediate. PROCEDURE: The procedure was explained to the patient. The risks and benefits of the procedure were discussed and the patient's questions were addressed. Informed consent was obtained from the patient. The right side of the abdomen was evaluated with ultrasound. The gallbladder was identified. The right side of the abdomen was prepped and draped in sterile fashion. Maximal barrier sterile technique was utilized including caps, mask, sterile gowns, sterile gloves, sterile drape, hand hygiene and skin antiseptic. Skin was anesthetized with 1% lidocaine. Using ultrasound guidance, a 21 gauge needle was directed into the distended gallbladder via a transhepatic approach. Needle placement confirmed in the gallbladder. Dark bile was identified. A 0.018 wire was placed and Accustick dilator set was placed. The tract was dilated to accommodate a 10.2 [place] multipurpose drain. The drain was reconstituted in the gallbladder and 75 mL of dark bile was removed. The gallbladder was decompressed at the end of the procedure based on ultrasound. Fluid was sent for culture. Catheter was attached to gravity bag. Catheter was sutured to skin. Bandage placed over the catheter. Fluoroscopic and ultrasound images were taken and saved for documentation. FINDINGS: Distended gallbladder. Successful placement of a percutaneous cholecystostomy tube via a transhepatic approach. 75 mL of dark bile was removed. IMPRESSION: Successful percutaneous cholecystostomy tube placement with ultrasound and fluoroscopic guidance. Bile was sent for culture. Electronically Signed   By: Markus Daft M.D.   On: 02/29/2016 11:11    Anti-infectives: Anti-infectives    Start     Dose/Rate Route Frequency Ordered Stop    02/27/16 1000  vancomycin (VANCOCIN) IVPB 1000 mg/200 mL premix  Status:  Discontinued     1,000 mg 200 mL/hr over 60 Minutes Intravenous Every 12 hours 02/27/16 0729 02/27/16 1516   02/27/16 0800  vancomycin (VANCOCIN) IVPB 750 mg/150 ml premix  Status:  Discontinued     750 mg 150 mL/hr over 60 Minutes Intravenous Every 12 hours 02/26/16 2100 02/27/16 0729   02/27/16 0500  piperacillin-tazobactam (ZOSYN) IVPB 3.375 g     3.375 g 12.5 mL/hr over 240 Minutes Intravenous Every 8 hours 02/26/16 2100     02/26/16 2100  vancomycin (VANCOCIN) 1,500 mg in sodium chloride 0.9 % 500 mL IVPB     1,500 mg 250 mL/hr over 120 Minutes Intravenous  Once 02/26/16 2057 02/26/16 2319   02/26/16 2100  piperacillin-tazobactam (ZOSYN) IVPB 3.375 g     3.375 g 100 mL/hr over 30 Minutes Intravenous  Once 02/26/16 2057 02/26/16 2149      Assessment/Plan CAD s/p CABG 2003 sCHF s/p ICD Emphysema/OSA S/p LVAD Recurrent GI bleeding  Cirrhosis(child's pugh score 10-C) PAF on Eliquis(none since admission 5/29)  Acute calculous cholecystitis   - s/p PERC drain placement by IR on 02/29/16; 75 mL dark bile out down in IR, cultures pending.  -  CMP ordered for AM, hopefully LFT's will trend down  - regular diet ordered    LOS: 3 days    Jill Alexanders , [facility] 02/29/2016, 11:45 AM Pager: [phone] Mon-Fri 7:00 am-4:30 pm Sat-Sun 7:00 am-11:30 am

## 2016-02-29 NOTE — Progress Notes (Signed)
HeartMate 2 Rounding Note  Subjective:    Feels better. Remains afebrile. Ab pain improved.   Underwent placement of percutaneous cholecystotomy tube today with IR. 75cc of dark bile removed. GS + for moderate GNR.   Blood cultures - NGTD.    MAPs stable    LVAD INTERROGATION:  HeartMate II LVAD:  Flow 50  liters/min, speed 9000, power 5.2, PI 5.6   Objective:    Vital Signs:   Temp:  [97.5 F (36.4 C)-98.6 F (37 C)] 97.8 F (36.6 C) (06/01 1137) Pulse Rate:  [61-86] 66 (06/01 1600) Resp:  [18-29] 20 (06/01 1600) BP: (83-107)/(65-86) 98/74 mmHg (06/01 1600) SpO2:  [92 %-98 %] 94 % (06/01 1600) Weight:  [220 lb 14.4 oz (100.2 kg)] 220 lb 14.4 oz (100.2 kg) (06/01 0328) Last BM Date:  (PTA) Mean arterial Pressure  80s   Intake/Output:   Intake/Output Summary (Last 24 hours) at 02/29/16 1839 Last data filed at 02/29/16 1800  Gross per 24 hour  Intake   2450 ml  Output   1265 ml  Net   1185 ml     Physical Exam: General:  Siting in bed  No resp difficulty.   HEENT: normal x for scleral icterus Neck: supple. JVP 7-8 . Carotids 2+ bilat; no bruits. No lymphadenopathy or thryomegaly appreciated. Cor: Mechanical heart sounds with LVAD hum present. Lungs: clear Abdomen: soft, RUQ drain  nondistended. No hepatosplenomegaly. No bruits or masses. Good bowel sounds. Driveline: C/D/I; securement device intact and driveline incorporated Extremities: no cyanosis, clubbing, rash, edema. LUE skin tears x2 Neuro: alert & orientedx3, cranial nerves grossly intact. moves all 4 extremities w/o difficulty. Affect pleasant  Telemetry:NSR 60s   Labs: Basic Metabolic Panel:  Recent Labs Lab 02/26/16 1839 02/26/16 1840 02/27/16 0241 02/28/16 0938  NA 137 135 136 136  K 3.4* 3.4* 3.3* 3.9  CL 99* 102 105 107  CO2  --  26 25 24   GLUCOSE 92 97 88 86  BUN 29* 29* 28* 15  CREATININE 1.40* 1.42* 1.13 0.91  CALCIUM  --  9.2 8.9 9.1    Liver Function Tests:  Recent  Labs Lab 02/26/16 1840 02/27/16 0241 02/28/16 0938  AST 275* 211* 105*  ALT 237* 204* 146*  ALKPHOS 159* 151* 188*  BILITOT 7.4* 7.2* 6.4*  PROT 5.2* 5.2* 5.2*  ALBUMIN 2.7* 2.4* 2.4*    Recent Labs Lab 02/26/16 2045  LIPASE 22   No results for input(s): AMMONIA in the last 168 hours.  CBC:  Recent Labs Lab 02/23/16 0643 02/26/16 1839 02/26/16 1840 02/27/16 0241 02/29/16 0438  WBC 6.2  --  10.9* 10.2 4.7  NEUTROABS  --   --  10.1*  --   --   HGB 9.8* 10.5* 9.4* 9.0* 8.9*  HCT 33.0* 31.0* 31.1* 30.4* 29.9*  MCV 95.4  --  90.7 91.0 90.3  PLT 129*  --  137* 130* 135*    INR:  Recent Labs Lab 02/26/16 1840 02/27/16 0241 02/28/16 0355 02/29/16 0438  INR 2.74* 2.00* 1.31 1.29    Other results:   Imaging: Ir Perc Cholecystostomy  02/29/2016  INDICATION: 75 year old with acute cholecystitis and multiple medical problems, including ischemic cardiomyopathy with a LVAD. Patient is not a surgical candidate. EXAM: PERCUTANEOUS CHOLECYSTOSTOMY TUBE PLACEMENT WITH ULTRASOUND AND FLUOROSCOPIC GUIDANCE MEDICATIONS: None ANESTHESIA/SEDATION: Moderate (conscious) sedation was employed during this procedure. A total of Versed 1.0 mg and Fentanyl 25 mcg was administered intravenously. Moderate Sedation Time: 20 minutes. The  patient's level of consciousness and vital signs were monitored continuously by radiology nursing throughout the procedure under my direct supervision. FLUOROSCOPY TIME:  Fluoroscopy Time: 42 seconds, 21 mGy COMPLICATIONS: None immediate. PROCEDURE: The procedure was explained to the patient. The risks and benefits of the procedure were discussed and the patient's questions were addressed. Informed consent was obtained from the patient. The right side of the abdomen was evaluated with ultrasound. The gallbladder was identified. The right side of the abdomen was prepped and draped in sterile fashion. Maximal barrier sterile technique was utilized including caps,  mask, sterile gowns, sterile gloves, sterile drape, hand hygiene and skin antiseptic. Skin was anesthetized with 1% lidocaine. Using ultrasound guidance, a 21 gauge needle was directed into the distended gallbladder via a transhepatic approach. Needle placement confirmed in the gallbladder. Dark bile was identified. A 0.018 wire was placed and Accustick dilator set was placed. The tract was dilated to accommodate a 10.2 [place] multipurpose drain. The drain was reconstituted in the gallbladder and 75 mL of dark bile was removed. The gallbladder was decompressed at the end of the procedure based on ultrasound. Fluid was sent for culture. Catheter was attached to gravity bag. Catheter was sutured to skin. Bandage placed over the catheter. Fluoroscopic and ultrasound images were taken and saved for documentation. FINDINGS: Distended gallbladder. Successful placement of a percutaneous cholecystostomy tube via a transhepatic approach. 75 mL of dark bile was removed. IMPRESSION: Successful percutaneous cholecystostomy tube placement with ultrasound and fluoroscopic guidance. Bile was sent for culture. Electronically Signed   By: Markus Daft M.D.   On: 02/29/2016 11:11     Medications:     Scheduled Medications: . citalopram  20 mg Oral QHS  . docusate sodium  200 mg Oral BID  . fentaNYL      . levothyroxine  100 mcg Oral QHS  . midazolam      . mometasone-formoterol  2 puff Inhalation BID  . pantoprazole  40 mg Oral BID  . piperacillin-tazobactam (ZOSYN)  IV  3.375 g Intravenous Q8H  . tiotropium  18 mcg Inhalation Daily  . vitamin B-12  1,000 mcg Oral QHS    Infusions: . 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 Quit smoking 2003. Uses CPAP  and O2 every night. He is s/p LVAD HM II implanted 01/12/13 under DT criteria. 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 Admitted with hypotension, RUQ pain, and elevated LFTs found to have acute cholecystitis and sepsis.   1. Septic shock - resolved 2. Acute cholecystitis - s/p percutaneous C-tube  3. 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 Treated with APC and clipping.   4. Atrial fibrillation: Paroxysmal. Amiodarone on hold for now with  elevated LFTs.    5. LVAD: HeartMate II. VAD parameters all looked ok on interrogation. Off antiicoagulants.  Place SCDs.  6. AKI: Creatinine improved with hydration  7. NSVT: History of NSVT runs. Watch while holding amiodarone.  8. LUE Skin Tear- Continue allevyn foam.   He is doing well s/p percutaneous C-tube. GS with GNR. Continue Zosyn.   HF stable. Volume status ok. VAD parameters ok. Renal function back to baseline.   Remains in NSR. Off  amio due to elevated LFTs. Can restart soon   Will need to start heparin soon. Hopefully in am.    I reviewed the LVAD parameters from today, and compared the results to the patient's prior recorded data.  No programming changes were made.  The LVAD is functioning within specified parameters.  The patient performs LVAD self-test daily.  LVAD interrogation was negative for any significant power changes, alarms or PI events/speed drops.  LVAD equipment check completed and is in good working order.  Back-up equipment present.   LVAD education done on emergency procedures and precautions and reviewed exit site care.  Length of Stay: 3  Ryan Boehme MD  02/29/2016, 6:39 PM  VAD Team --- VAD ISSUES ONLY--- Pager [phone] (7am - 7am)  Advanced Heart Failure Team  Pager [phone] (M-F; 7a - 4p)  Please contact [facility] for night-coverage after hours (4p -7a ) and weekends on [email or web address]

## 2016-02-29 NOTE — Progress Notes (Signed)
Admitted 02/26/16 due to RUQ pain, Jaundice and elevated LFTs.   HeartMate II LVAD implated on 01/12/13 by PVT for DT.   Vital signs: HR: 77, afib Doppler MAP: 102  Automated BP: 103/86 (94)  O2 Sat: 94% on RA  Wt: 100.2 kg    LVAD interrogation reveals:  Speed: 9000 Flow: 4.6 Power: 5w  PI: 6.3 Alarms: none Events:  none Fixed speed: 9000 Low speed limit: 8400  Drive Line: Weekly dressings being maintained by his wife at bedside. Last changed this AM.   Labs:  LDH trend: 243 > 230 > 208  Liver Function:   Plan/Recommendations:  1.

## 2016-02-29 NOTE — Progress Notes (Signed)
VAD Coordinator Procedure Note:   Patient underwent percutaneous cholecystostomy drain placement in IR today. Hemodynamics and VAD parameters monitored by me throughout the procedure. Blood pressure was monitored with automatic BP cuff to LUE as the doppler was reflecting the modified systolic. All of his primary and back up equipment was present during the procedure as well as myself to assist with management while he is sedated.   Pre-procedure:  4.6 lpm / 9000 / 6.3 / 5.0w  103/86 (94)  102 doppler (reflecting modified systolic)  HR 77, afib on tele  94% on 2L [place]  Sedation Induction:Versed / Fentanyl per IR team  1015: 4.5 / 9000 / 5.8 / 5.0w  96/78 (86)   1020: 4.5 / 9000 / 5.8 / 5.0w  91/77 (84)  1030: 4.8 / 9000 / 5.3 / 5.8w  90/78 (84)  Interventions: none  Recovery area:  1045: 4.5 / 9000 / 5.9 / 5.8w  93/81(87)   Patient tolerated the procedure well. VAD parameters and hemodynamics were stable throughout the case.   Patient Disposition: 2H room in the care of his nurse.

## 2016-02-29 NOTE — Sedation Documentation (Signed)
Patient is resting comfortably. No complaints at this time 

## 2016-02-29 NOTE — Progress Notes (Signed)
RT NOTE: ? ?Pt refuses CPAP.  ?

## 2016-02-29 NOTE — Sedation Documentation (Signed)
Vital signs stable. No complaints from pt at this time.  

## 2016-03-01 ENCOUNTER — Inpatient Hospital Stay (HOSPITAL_COMMUNITY): Payer: Non-veteran care

## 2016-03-01 DIAGNOSIS — I509 Heart failure, unspecified: Secondary | ICD-10-CM

## 2016-03-01 DIAGNOSIS — I5023 Acute on chronic systolic (congestive) heart failure: Secondary | ICD-10-CM

## 2016-03-01 LAB — COMPREHENSIVE METABOLIC PANEL
ALBUMIN: 2.1 g/dL — AB (ref 3.5–5.0)
ALK PHOS: 200 U/L — AB (ref 38–126)
ALT: 91 U/L — ABNORMAL HIGH (ref 17–63)
AST: 61 U/L — AB (ref 15–41)
Anion gap: 5 (ref 5–15)
BILIRUBIN TOTAL: 2.7 mg/dL — AB (ref 0.3–1.2)
BUN: 13 mg/dL (ref 6–20)
CALCIUM: 8.9 mg/dL (ref 8.9–10.3)
CO2: 22 mmol/L (ref 22–32)
CREATININE: 0.84 mg/dL (ref 0.61–1.24)
Chloride: 112 mmol/L — ABNORMAL HIGH (ref 101–111)
GFR calc Af Amer: >60 mL/min (ref >60)
GFR calc non Af Amer: >60 mL/min (ref >60)
GLUCOSE: 124 mg/dL — AB (ref 65–99)
Potassium: 3.7 mmol/L (ref 3.5–5.1)
Sodium: 139 mmol/L (ref 135–145)
TOTAL PROTEIN: 4.9 g/dL — AB (ref 6.5–8.1)

## 2016-03-01 LAB — LACTATE DEHYDROGENASE: LDH: 213 U/L — ABNORMAL HIGH (ref 98–192)

## 2016-03-01 LAB — CBC
HEMATOCRIT: 31.5 % — AB (ref 39.0–52.0)
HEMOGLOBIN: 9.1 g/dL — AB (ref 13.0–17.0)
MCH: 26.3 pg (ref 26.0–34.0)
MCHC: 28.9 g/dL — AB (ref 30.0–36.0)
MCV: 91 fL (ref 78.0–100.0)
Platelets: 153 10*3/uL (ref 150–400)
RBC: 3.46 MIL/uL — ABNORMAL LOW (ref 4.22–5.81)
RDW: 19.3 % — ABNORMAL HIGH (ref 11.5–15.5)
WBC: 5.6 10*3/uL (ref 4.0–10.5)

## 2016-03-01 LAB — ECHOCARDIOGRAM COMPLETE
Height: 72 in
Weight: 3521.54 oz

## 2016-03-01 LAB — PROTIME-INR
INR: 1.2 (ref 0.00–1.49)
Prothrombin Time: 15.3 seconds — ABNORMAL HIGH (ref 11.6–15.2)

## 2016-03-01 MED ORDER — APIXABAN 5 MG PO TABS
5.0000 mg | ORAL_TABLET | Freq: Two times a day (BID) | ORAL | Status: DC
  Administered 2016-03-01 – 2016-03-03 (×4): 5 mg via ORAL
  Filled 2016-03-01 (×4): qty 1

## 2016-03-01 MED ORDER — FUROSEMIDE 10 MG/ML IJ SOLN
40.0000 mg | Freq: Once | INTRAMUSCULAR | Status: AC
  Administered 2016-03-01: 40 mg via INTRAVENOUS
  Filled 2016-03-01: qty 4

## 2016-03-01 NOTE — Progress Notes (Signed)
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 Overall he received 6U PRBCs. 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 He has had minimal po intake since discharge due to nausea. He has had right upper quadrant pain.Felt "dizzy" and fell once with presyncope. Hgb down. HIDA scan with +cholecystitis. 6/01 cholecystostomy drain    PT Comments    Patient finally able to walk in hall today! Several seated rests with total distance 200 ft. Dyspnea primary limiting factor. Patient must go up 4 stairs with rails to enter his home.   Follow Up Recommendations  No PT follow up;Supervision/Assistance - 24 hour     Equipment Recommendations  None recommended by PT    Recommendations for Other Services OT consult     Precautions / Restrictions Precautions Precautions: Fall Precaution Comments: pt reports legs begins to tremor, progresses to full body, and falls (eitology unknown)    Mobility  Bed Mobility Overal bed mobility: Needs Assistance Bed Mobility: Supine to Sit     Supine to sit: Min assist        Transfers Overall transfer level: Needs assistance Equipment used: Rolling walker (2 wheeled);1 person hand held assist Transfers: Sit to/from Stand Sit to Stand: Min guard;+2 safety/equipment         General transfer comment: x 4 (seated rests while walking); vc for safe use of RW  Ambulation/Gait Ambulation/Gait assistance: Min guard;+2 safety/equipment Ambulation Distance (Feet): 50 Feet (sit, 50, sit, 100) Assistive device: Rolling walker (2 wheeled) Gait Pattern/deviations: Step-through pattern;Decreased stride  length;Trunk flexed   Gait velocity interpretation: Below normal speed for age/gender General Gait Details: wife persuaded him to walk; prefers no device however agreed to use RW for safety; 2nd person to follow with chair to maximize his distance; pt with very little warning when he is ready to sit   Stairs            Wheelchair Mobility    Modified Rankin (Stroke Patients Only)       Balance           Standing balance support: Single extremity supported Standing balance-Leahy Scale: Poor                      Cognition Arousal/Alertness: Awake/alert Behavior During Therapy: WFL for tasks assessed/performed Overall Cognitive Status: Within Functional Limits for tasks assessed                      Exercises      General Comments General comments (skin integrity, edema, etc.): Focused on pursed lip breathing as dyspnea his primary limiting factor today; required vc throughout       Pertinent Vitals/Pain Pain Assessment: Faces Faces Pain Scale: Hurts a little bit Pain Location: rt side Pain Descriptors / Indicators: Operative site guarding Pain Intervention(s): Limited activity within patient's tolerance    Home Living                      Prior Function            PT Goals (current goals can now be found in the care plan section) Acute Rehab PT  Goals Patient Stated Goal: return home Time For Goal Achievement: 03/05/16 Progress towards PT goals: Progressing toward goals    Frequency  Min 3X/week    PT Plan Current plan remains appropriate    Co-evaluation             End of Session Equipment Utilized During Treatment: Gait belt Activity Tolerance: Patient limited by fatigue Patient left: with call bell/phone within reach;with family/visitor present;in bed (sitting EOB for lunch; refused chair)     Time: [id number] PT Time Calculation (min) (ACUTE ONLY): 24 min  Charges:  $Gait Training: 23-37 mins                     G Codes:      Nathin Saran Mar 07, 2016, 2:35 PM Pager [phone]

## 2016-03-01 NOTE — Progress Notes (Signed)
Subjective: He feels SOB and is wheezing some this AM.  Site is fine and the bile is clearer than reported yesterday.  Tolerating PO's and much less discomfort abdomen.    Objective: Vital signs in last 24 hours: Temp:  [97.1 F (36.2 C)-97.8 F (36.6 C)] 97.1 F (36.2 C) (06/02 0500) Pulse Rate:  [61-79] 66 (06/02 0500) Resp:  [16-25] 22 (06/02 0500) BP: (90-107)/(65-86) 102/86 mmHg (06/02 0500) SpO2:  [92 %-98 %] 98 % (06/02 0500) Weight:  [99.835 kg (220 lb 1.5 oz)] 99.835 kg (220 lb 1.5 oz) (06/02 0500) Last BM Date:  (PTA) PO 300 recorded. Urine 1075 Drain 225 Afebrile, VSS Bilirubin down to 2.7, WBC is normal Intake/Output from previous day: 06/01 0701 - 06/02 0700 In: 2650 [P.O.:300; I.V.:2300; IV Piggyback:50] Out: 1300 [Urine:1075; Drains:225] Intake/Output this shift:    General appearance: alert, cooperative, no distress and says she feels SOB this AM Resp: Wheezing some this AM.   GI: soft sore, site OK drain working well.  Lab Results:   Recent Labs  02/29/16 0438 03/01/16 0242  WBC 4.7 5.6  HGB 8.9* 9.1*  HCT 29.9* 31.5*  PLT 135* 153    BMET  Recent Labs  02/28/16 0938 03/01/16 0242  NA 136 139  K 3.9 3.7  CL 107 112*  CO2 24 22  GLUCOSE 86 124*  BUN 15 13  CREATININE 0.91 0.84  CALCIUM 9.1 8.9   PT/INR  Recent Labs  02/29/16 0438 03/01/16 0242  LABPROT 16.3* 15.3*  INR 1.29 1.20     Recent Labs Lab 02/26/16 1840 02/27/16 0241 02/28/16 0938 03/01/16 0242  AST 275* 211* 105* 61*  ALT 237* 204* 146* 91*  ALKPHOS 159* 151* 188* 200*  BILITOT 7.4* 7.2* 6.4* 2.7*  PROT 5.2* 5.2* 5.2* 4.9*  ALBUMIN 2.7* 2.4* 2.4* 2.1*     Lipase     Component Value Date/Time   LIPASE 22 02/26/2016 2045     Studies/Results: Ir Perc Cholecystostomy  02/29/2016  INDICATION: 75 year old with acute cholecystitis and multiple medical problems, including ischemic cardiomyopathy with a LVAD. Patient is not a surgical candidate. EXAM:  PERCUTANEOUS CHOLECYSTOSTOMY TUBE PLACEMENT WITH ULTRASOUND AND FLUOROSCOPIC GUIDANCE MEDICATIONS: None ANESTHESIA/SEDATION: Moderate (conscious) sedation was employed during this procedure. A total of Versed 1.0 mg and Fentanyl 25 mcg was administered intravenously. Moderate Sedation Time: 20 minutes. The patient's level of consciousness and vital signs were monitored continuously by radiology nursing throughout the procedure under my direct supervision. FLUOROSCOPY TIME:  Fluoroscopy Time: 42 seconds, 21 mGy COMPLICATIONS: None immediate. PROCEDURE: The procedure was explained to the patient. The risks and benefits of the procedure were discussed and the patient's questions were addressed. Informed consent was obtained from the patient. The right side of the abdomen was evaluated with ultrasound. The gallbladder was identified. The right side of the abdomen was prepped and draped in sterile fashion. Maximal barrier sterile technique was utilized including caps, mask, sterile gowns, sterile gloves, sterile drape, hand hygiene and skin antiseptic. Skin was anesthetized with 1% lidocaine. Using ultrasound guidance, a 21 gauge needle was directed into the distended gallbladder via a transhepatic approach. Needle placement confirmed in the gallbladder. Dark bile was identified. A 0.018 wire was placed and Accustick dilator set was placed. The tract was dilated to accommodate a 10.2 [place] multipurpose drain. The drain was reconstituted in the gallbladder and 75 mL of dark bile was removed. The gallbladder was decompressed at the end of the procedure based on ultrasound. Fluid was  sent for culture. Catheter was attached to gravity bag. Catheter was sutured to skin. Bandage placed over the catheter. Fluoroscopic and ultrasound images were taken and saved for documentation. FINDINGS: Distended gallbladder. Successful placement of a percutaneous cholecystostomy tube via a transhepatic approach. 75 mL of dark bile was  removed. IMPRESSION: Successful percutaneous cholecystostomy tube placement with ultrasound and fluoroscopic guidance. Bile was sent for culture. Electronically Signed   By: Markus Daft M.D.   On: 02/29/2016 11:11    Medications: . citalopram  20 mg Oral QHS  . docusate sodium  200 mg Oral BID  . levothyroxine  100 mcg Oral QHS  . mometasone-formoterol  2 puff Inhalation BID  . pantoprazole  40 mg Oral BID  . piperacillin-tazobactam (ZOSYN)  IV  3.375 g Intravenous Q8H  . tiotropium  18 mcg Inhalation Daily  . vitamin B-12  1,000 mcg Oral QHS   . sodium chloride 100 mL/hr at 02/28/16 0559    Assessment/Plan Acute calculous cholecystitis  - s/p PERC drain placement by IR on 02/29/16 (Gm negative rods in bile fluid) GI bleed from AVM S/p LVAD CAD s/p CABG 2003 sCHF s/p ICD PAF on Eliquis(none since admission 5/29) Emphysema/OSA/CPAP with O2 Cirrhosis(child's pugh score 10-C) Hypothyroid FEN:  Regular diet ID:  Day 5 Zosyn DVT:  SCD  Plan:  As noted by Dr. Redmond Pulling yesterday, you can resume blood thinners, antibiotics per Cardiology but he recommended at least 2 weeks.  The drain will stay in at least 6 weeks will arrange follow up in our office.  I decreased his IV fluids to KO with his complaints of SOB and wheezing.     LOS: 4 days    Ivry Pigue 03/01/2016 [phone]

## 2016-03-01 NOTE — Progress Notes (Signed)
Patient refuses CPAP 

## 2016-03-01 NOTE — Progress Notes (Signed)
  Echocardiogram 2D Echocardiogram has been performed.  [name] 03/01/2016, 3:00 PM

## 2016-03-01 NOTE — Discharge Instructions (Addendum)
**  PLEASE REMEMBER TO BRING ALL OF YOUR MEDICATIONS TO EACH OF YOUR FOLLOW-UP OFFICE VISITS.   Information on my medicine - ELIQUIS (apixaban)  This medication education was reviewed with me or my healthcare representative as part of my discharge preparation.  The pharmacist that spoke with me during my hospital stay was:  Georgina Peer, [facility]  Why was Eliquis prescribed for you? Eliquis was prescribed for you to reduce the risk of forming blood clots that can cause a stroke with a heart pump. What do You need to know about Eliquis ? Take your Eliquis TWICE DAILY - one tablet in the morning and one tablet in the evening with or without food.  It would be best to take the doses about the same time each day.  If you have difficulty swallowing the tablet whole please discuss with your pharmacist how to take the medication safely.  Take Eliquis exactly as prescribed by your doctor and DO NOT stop taking Eliquis without talking to the doctor who prescribed the medication.  Stopping may increase your risk of developing a new clot or stroke.  Refill your prescription before you run out.  After discharge, you should have regular check-up appointments with your healthcare provider that is prescribing your Eliquis.  In the future your dose may need to be changed if your kidney function or weight changes by a significant amount or as you get older.  What do you do if you miss a dose? If you miss a dose, take it as soon as you remember on the same day and resume taking twice daily.  Do not take more than one dose of ELIQUIS at the same time.  Important Safety Information A possible side effect of Eliquis is bleeding. You should call your healthcare provider right away if you experience any of the following: ? Bleeding from an injury or your nose that does not stop. ? Unusual colored urine (red or dark brown) or unusual colored stools (red or black). ? Unusual bruising for unknown  reasons. ? A serious fall or if you hit your head (even if there is no bleeding).  Some medicines may interact with Eliquis and might increase your risk of bleeding or clotting while on Eliquis. To help avoid this, consult your healthcare provider or pharmacist prior to using any new prescription or non-prescription medications, including herbals, vitamins, non-steroidal anti-inflammatory drugs (NSAIDs) and supplements.  This website has more information on Eliquis (apixaban): [email or web address].

## 2016-03-01 NOTE — Progress Notes (Signed)
HeartMate 2 Rounding Note  Subjective:     Underwent placement of percutaneous cholecystotomy tube 6/1 with IR. 75cc of dark bile removed. GS + for moderate GNR.   SOB this am. Wheezing. GSU stopped IVF.   Cultures - NGTD.    MAPs stable    LVAD INTERROGATION:  HeartMate II LVAD:  Flow 5.0  liters/min, speed 9000, power 5.0, PI 6.3  Objective:    Vital Signs:   Temp:  [97.1 F (36.2 C)-97.8 F (36.6 C)] 97.1 F (36.2 C) (06/02 0500) Pulse Rate:  [61-79] 65 (06/02 0744) Resp:  [16-25] 19 (06/02 0744) BP: (90-107)/(65-88) 105/88 mmHg (06/02 0744) SpO2:  [92 %-98 %] 96 % (06/02 0744) Weight:  [99.835 kg (220 lb 1.5 oz)] 99.835 kg (220 lb 1.5 oz) (06/02 0500) Last BM Date:  (PTA) Mean arterial Pressure  80s   Intake/Output:   Intake/Output Summary (Last 24 hours) at 03/01/16 0813 Last data filed at 03/01/16 0600  Gross per 24 hour  Intake   2550 ml  Output   1050 ml  Net   1500 ml     Physical Exam: General:  Lying in bed  No resp difficulty.   HEENT: normal x for scleral icterus Neck: supple. JVP jaw. Carotids 2+ bilat; no bruits. No lymphadenopathy or thryomegaly appreciated. Cor: Mechanical heart sounds with LVAD hum present. Lungs: clear Abdomen: soft, RUQ drain  nondistended. No hepatosplenomegaly. No bruits or masses. Good bowel sounds. Driveline: C/D/I; securement device intact and driveline incorporated mild red streak  Extremities: no cyanosis, clubbing, rash, edema. LUE skin tears x2 Neuro: alert & orientedx3, cranial nerves grossly intact. moves all 4 extremities w/o difficulty. Affect pleasant  Telemetry:NSR 60s   Labs: Basic Metabolic Panel:  Recent Labs Lab 02/26/16 1839  02/26/16 1840 02/27/16 0241 02/28/16 0938 03/01/16 0242  NA 137  --  135 136 136 139  K 3.4*  --  3.4* 3.3* 3.9 3.7  CL 99*  --  102 105 107 112*  CO2  --   --  26 25 24 22   GLUCOSE 92  --  97 88 86 124*  BUN 29*  --  29* 28* 15 13  CREATININE 1.40*  --  1.42*  1.13 0.91 0.84  CALCIUM  --   < > 9.2 8.9 9.1 8.9  < > = values in this interval not displayed.  Liver Function Tests:  Recent Labs Lab 02/26/16 1840 02/27/16 0241 02/28/16 0938 03/01/16 0242  AST 275* 211* 105* 61*  ALT 237* 204* 146* 91*  ALKPHOS 159* 151* 188* 200*  BILITOT 7.4* 7.2* 6.4* 2.7*  PROT 5.2* 5.2* 5.2* 4.9*  ALBUMIN 2.7* 2.4* 2.4* 2.1*    Recent Labs Lab 02/26/16 2045  LIPASE 22   No results for input(s): AMMONIA in the last 168 hours.  CBC:  Recent Labs Lab 02/26/16 1839 02/26/16 1840 02/27/16 0241 02/29/16 0438 03/01/16 0242  WBC  --  10.9* 10.2 4.7 5.6  NEUTROABS  --  10.1*  --   --   --   HGB 10.5* 9.4* 9.0* 8.9* 9.1*  HCT 31.0* 31.1* 30.4* 29.9* 31.5*  MCV  --  90.7 91.0 90.3 91.0  PLT  --  137* 130* 135* 153    INR:  Recent Labs Lab 02/26/16 1840 02/27/16 0241 02/28/16 0355 02/29/16 0438 03/01/16 0242  INR 2.74* 2.00* 1.31 1.29 1.20    Other results:   Imaging: Ir Perc Cholecystostomy  02/29/2016  INDICATION: 75 year old with acute cholecystitis  and multiple medical problems, including ischemic cardiomyopathy with a LVAD. Patient is not a surgical candidate. EXAM: PERCUTANEOUS CHOLECYSTOSTOMY TUBE PLACEMENT WITH ULTRASOUND AND FLUOROSCOPIC GUIDANCE MEDICATIONS: None ANESTHESIA/SEDATION: Moderate (conscious) sedation was employed during this procedure. A total of Versed 1.0 mg and Fentanyl 25 mcg was administered intravenously. Moderate Sedation Time: 20 minutes. The patient's level of consciousness and vital signs were monitored continuously by radiology nursing throughout the procedure under my direct supervision. FLUOROSCOPY TIME:  Fluoroscopy Time: 42 seconds, 21 mGy COMPLICATIONS: None immediate. PROCEDURE: The procedure was explained to the patient. The risks and benefits of the procedure were discussed and the patient's questions were addressed. Informed consent was obtained from the patient. The right side of the abdomen was  evaluated with ultrasound. The gallbladder was identified. The right side of the abdomen was prepped and draped in sterile fashion. Maximal barrier sterile technique was utilized including caps, mask, sterile gowns, sterile gloves, sterile drape, hand hygiene and skin antiseptic. Skin was anesthetized with 1% lidocaine. Using ultrasound guidance, a 21 gauge needle was directed into the distended gallbladder via a transhepatic approach. Needle placement confirmed in the gallbladder. Dark bile was identified. A 0.018 wire was placed and Accustick dilator set was placed. The tract was dilated to accommodate a 10.2 [place] multipurpose drain. The drain was reconstituted in the gallbladder and 75 mL of dark bile was removed. The gallbladder was decompressed at the end of the procedure based on ultrasound. Fluid was sent for culture. Catheter was attached to gravity bag. Catheter was sutured to skin. Bandage placed over the catheter. Fluoroscopic and ultrasound images were taken and saved for documentation. FINDINGS: Distended gallbladder. Successful placement of a percutaneous cholecystostomy tube via a transhepatic approach. 75 mL of dark bile was removed. IMPRESSION: Successful percutaneous cholecystostomy tube placement with ultrasound and fluoroscopic guidance. Bile was sent for culture. Electronically Signed   By: Markus Daft M.D.   On: 02/29/2016 11:11     Medications:     Scheduled Medications: . citalopram  20 mg Oral QHS  . docusate sodium  200 mg Oral BID  . levothyroxine  100 mcg Oral QHS  . mometasone-formoterol  2 puff Inhalation BID  . pantoprazole  40 mg Oral BID  . piperacillin-tazobactam (ZOSYN)  IV  3.375 g Intravenous Q8H  . tiotropium  18 mcg Inhalation Daily  . vitamin B-12  1,000 mcg Oral QHS    Infusions: . 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 Quit smoking 2003. Uses CPAP and O2 every night. He is s/p LVAD HM II implanted 01/12/13 under DT criteria. 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 Admitted with hypotension, RUQ pain, and elevated LFTs found to have acute cholecystitis and sepsis.   1. Septic shock - resolved 2. Acute cholecystitis - s/p percutaneous C-tube  3. 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 Treated with APC and clipping.   4. Atrial fibrillation: Paroxysmal. Amiodarone on hold for now with  elevated LFTs.    5. LVAD: HeartMate II. VAD parameters all looked ok on interrogation. Off antiicoagulants.  Place SCDs.  6. AKI: Creatinine improved with hydration  7. NSVT: History of NSVT runs. Watch while holding amiodarone.  8. LUE Skin Tear- Continue allevyn foam.  9. A/C systolic heart failure  He is doing well s/p percutaneous C-tube. GS with GNR. Continue Zosyn. Likely can switch to Augmentin prior to d/c.   Volume status up. Will give one dose lasix  Remains in NSR. Off amio due to elevated LFTs. Can restart soon   Will  start heparin and coumadin. No bolus.   I reviewed the LVAD parameters from today, and compared the results to the patient's prior recorded data.  No programming changes were made.  The LVAD is functioning within specified parameters.  The patient performs LVAD self-test daily.  LVAD interrogation was negative for any significant power changes, alarms or PI events/speed drops.  LVAD equipment check completed and is in good working order.  Back-up equipment present.   LVAD education done on emergency procedures and precautions and reviewed exit site care.  Length of Stay: 4  Ryan Shiflet MD  03/01/2016, 8:12 AM  VAD Team --- VAD ISSUES ONLY--- Pager [phone]  (7am - 7am)  Advanced Heart Failure Team  Pager [phone] (M-F; 7a - 4p)  Please contact [facility] for night-coverage after hours (4p -7a ) and weekends on [email or web address]

## 2016-03-01 NOTE — Care Management Important Message (Signed)
Important Message  Patient Details  Name: [name] MRN: [id number] Date of Birth: [birth year]   Medicare Important Message Given:  Yes    Loann Quill 03/01/2016, 9:03 AM

## 2016-03-02 DIAGNOSIS — I5043 Acute on chronic combined systolic (congestive) and diastolic (congestive) heart failure: Secondary | ICD-10-CM

## 2016-03-02 LAB — HEPATIC FUNCTION PANEL
ALBUMIN: 2.2 g/dL — AB (ref 3.5–5.0)
ALK PHOS: 176 U/L — AB (ref 38–126)
ALT: 74 U/L — AB (ref 17–63)
AST: 43 U/L — AB (ref 15–41)
Bilirubin, Direct: 1.2 mg/dL — ABNORMAL HIGH (ref 0.1–0.5)
Indirect Bilirubin: 1.1 mg/dL — ABNORMAL HIGH (ref 0.3–0.9)
TOTAL PROTEIN: 5.3 g/dL — AB (ref 6.5–8.1)
Total Bilirubin: 2.3 mg/dL — ABNORMAL HIGH (ref 0.3–1.2)

## 2016-03-02 LAB — CULTURE, BLOOD (ROUTINE X 2)
CULTURE: NO GROWTH
CULTURE: NO GROWTH

## 2016-03-02 LAB — CBC
HCT: 31.1 % — ABNORMAL LOW (ref 39.0–52.0)
HEMOGLOBIN: 8.9 g/dL — AB (ref 13.0–17.0)
MCH: 26.3 pg (ref 26.0–34.0)
MCHC: 28.6 g/dL — ABNORMAL LOW (ref 30.0–36.0)
MCV: 91.7 fL (ref 78.0–100.0)
PLATELETS: 164 10*3/uL (ref 150–400)
RBC: 3.39 MIL/uL — AB (ref 4.22–5.81)
RDW: 19.4 % — ABNORMAL HIGH (ref 11.5–15.5)
WBC: 7 10*3/uL (ref 4.0–10.5)

## 2016-03-02 LAB — BASIC METABOLIC PANEL
ANION GAP: 6 (ref 5–15)
BUN: 12 mg/dL (ref 6–20)
CHLORIDE: 107 mmol/L (ref 101–111)
CO2: 28 mmol/L (ref 22–32)
Calcium: 9.2 mg/dL (ref 8.9–10.3)
Creatinine, Ser: 0.93 mg/dL (ref 0.61–1.24)
GFR calc Af Amer: >60 mL/min (ref >60)
Glucose, Bld: 131 mg/dL — ABNORMAL HIGH (ref 65–99)
POTASSIUM: 3.5 mmol/L (ref 3.5–5.1)
SODIUM: 141 mmol/L (ref 135–145)

## 2016-03-02 LAB — PROTIME-INR
INR: 1.36 (ref 0.00–1.49)
PROTHROMBIN TIME: 16.9 s — AB (ref 11.6–15.2)

## 2016-03-02 LAB — LACTATE DEHYDROGENASE: LDH: 178 U/L (ref 98–192)

## 2016-03-02 MED ORDER — POTASSIUM CHLORIDE CRYS ER 20 MEQ PO TBCR
40.0000 meq | EXTENDED_RELEASE_TABLET | Freq: Once | ORAL | Status: AC
  Administered 2016-03-02: 40 meq via ORAL
  Filled 2016-03-02: qty 2

## 2016-03-02 MED ORDER — AMIODARONE HCL 200 MG PO TABS
200.0000 mg | ORAL_TABLET | Freq: Every day | ORAL | Status: DC
  Administered 2016-03-02 – 2016-03-03 (×2): 200 mg via ORAL
  Filled 2016-03-02 (×2): qty 1

## 2016-03-02 MED ORDER — FUROSEMIDE 10 MG/ML IJ SOLN
40.0000 mg | Freq: Once | INTRAMUSCULAR | Status: AC
  Administered 2016-03-02: 40 mg via INTRAVENOUS
  Filled 2016-03-02: qty 4

## 2016-03-02 MED ORDER — DEXTROSE 5 % IV SOLN
2.0000 g | INTRAVENOUS | Status: DC
  Administered 2016-03-02: 2 g via INTRAVENOUS
  Filled 2016-03-02 (×2): qty 2

## 2016-03-02 NOTE — Progress Notes (Signed)
Patient ID: [name], male   DOB: August 28, 1941, 75 y.o.   MRN: [id number]    HeartMate 2 Rounding Note  Subjective:    Underwent placement of percutaneous cholecystotomy tube 6/1 with IR. 75cc of dark bile removed.  Cultures witih Klebsiella, pending sensitivities.    Got Lasix yesterday with good UOP.  Still with some dyspnea.   MAPs stable in 80s  LVAD INTERROGATION:  HeartMate II LVAD:  Flow 4.8 liters/min, speed 9000, power 5.1, PI 5.8.  No PIs.   Objective:    Vital Signs:   Temp:  [97.4 F (36.3 C)-98.2 F (36.8 C)] 98.2 F (36.8 C) (06/03 0700) Pulse Rate:  [56-74] 66 (06/03 0700) Resp:  [18] 18 (06/02 2131) BP: (90-99)/(74-84) 90/76 mmHg (06/03 0422) SpO2:  [95 %-99 %] 99 % (06/03 0854) Weight:  [218 lb 9.6 oz (99.156 kg)] 218 lb 9.6 oz (99.156 kg) (06/03 0422) Last BM Date:  (PTA) Mean arterial Pressure  80s   Intake/Output:   Intake/Output Summary (Last 24 hours) at 03/02/16 0916 Last data filed at 03/02/16 0900  Gross per 24 hour  Intake   1080 ml  Output   2408 ml  Net  -1328 ml     Physical Exam: General:  Lying in bed  No resp difficulty.   HEENT: normal x for scleral icterus Neck: supple. JVP 10 cm. Carotids 2+ bilat; no bruits. No lymphadenopathy or thryomegaly appreciated. Cor: Mechanical heart sounds with LVAD hum present. Lungs: clear Abdomen: soft, RUQ drain  nondistended. No hepatosplenomegaly. No bruits or masses. Good bowel sounds. Driveline: C/D/I; securement device intact and driveline incorporated mild red streak  Extremities: no cyanosis, clubbing, rash, edema. LUE skin tears x2 Neuro: alert & orientedx3, cranial nerves grossly intact. moves all 4 extremities w/o difficulty. Affect pleasant  Telemetry: Atrial fibrillation in 60s   Labs: Basic Metabolic Panel:  Recent Labs Lab 02/26/16 1840 02/27/16 0241 02/28/16 0938 03/01/16 0242 03/02/16 0215  NA 135 136 136 139 141  K 3.4* 3.3* 3.9 3.7 3.5  CL 102 105 107 112* 107    CO2 26 25 24 22 28   GLUCOSE 97 88 86 124* 131*  BUN 29* 28* 15 13 12   CREATININE 1.42* 1.13 0.91 0.84 0.93  CALCIUM 9.2 8.9 9.1 8.9 9.2    Liver Function Tests:  Recent Labs Lab 02/26/16 1840 02/27/16 0241 02/28/16 0938 03/01/16 0242 03/02/16 0806  AST 275* 211* 105* 61* 43*  ALT 237* 204* 146* 91* 74*  ALKPHOS 159* 151* 188* 200* 176*  BILITOT 7.4* 7.2* 6.4* 2.7* 2.3*  PROT 5.2* 5.2* 5.2* 4.9* 5.3*  ALBUMIN 2.7* 2.4* 2.4* 2.1* 2.2*    Recent Labs Lab 02/26/16 2045  LIPASE 22   No results for input(s): AMMONIA in the last 168 hours.  CBC:  Recent Labs Lab 02/26/16 1840 02/27/16 0241 02/29/16 0438 03/01/16 0242 03/02/16 0215  WBC 10.9* 10.2 4.7 5.6 7.0  NEUTROABS 10.1*  --   --   --   --   HGB 9.4* 9.0* 8.9* 9.1* 8.9*  HCT 31.1* 30.4* 29.9* 31.5* 31.1*  MCV 90.7 91.0 90.3 91.0 91.7  PLT 137* 130* 135* 153 164    INR:  Recent Labs Lab 02/27/16 0241 02/28/16 0355 02/29/16 0438 03/01/16 0242 03/02/16 0215  INR 2.00* 1.31 1.29 1.20 1.36    Other results:   Imaging: Ir Perc Cholecystostomy  02/29/2016  INDICATION: 75 year old with acute cholecystitis and multiple medical problems, including ischemic cardiomyopathy with a LVAD. Patient is  not a surgical candidate. EXAM: PERCUTANEOUS CHOLECYSTOSTOMY TUBE PLACEMENT WITH ULTRASOUND AND FLUOROSCOPIC GUIDANCE MEDICATIONS: None ANESTHESIA/SEDATION: Moderate (conscious) sedation was employed during this procedure. A total of Versed 1.0 mg and Fentanyl 25 mcg was administered intravenously. Moderate Sedation Time: 20 minutes. The patient's level of consciousness and vital signs were monitored continuously by radiology nursing throughout the procedure under my direct supervision. FLUOROSCOPY TIME:  Fluoroscopy Time: 42 seconds, 21 mGy COMPLICATIONS: None immediate. PROCEDURE: The procedure was explained to the patient. The risks and benefits of the procedure were discussed and the patient's questions were addressed.  Informed consent was obtained from the patient. The right side of the abdomen was evaluated with ultrasound. The gallbladder was identified. The right side of the abdomen was prepped and draped in sterile fashion. Maximal barrier sterile technique was utilized including caps, mask, sterile gowns, sterile gloves, sterile drape, hand hygiene and skin antiseptic. Skin was anesthetized with 1% lidocaine. Using ultrasound guidance, a 21 gauge needle was directed into the distended gallbladder via a transhepatic approach. Needle placement confirmed in the gallbladder. Dark bile was identified. A 0.018 wire was placed and Accustick dilator set was placed. The tract was dilated to accommodate a 10.2 [place] multipurpose drain. The drain was reconstituted in the gallbladder and 75 mL of dark bile was removed. The gallbladder was decompressed at the end of the procedure based on ultrasound. Fluid was sent for culture. Catheter was attached to gravity bag. Catheter was sutured to skin. Bandage placed over the catheter. Fluoroscopic and ultrasound images were taken and saved for documentation. FINDINGS: Distended gallbladder. Successful placement of a percutaneous cholecystostomy tube via a transhepatic approach. 75 mL of dark bile was removed. IMPRESSION: Successful percutaneous cholecystostomy tube placement with ultrasound and fluoroscopic guidance. Bile was sent for culture. Electronically Signed   By: Markus Daft M.D.   On: 02/29/2016 11:11     Medications:     Scheduled Medications: . amiodarone  200 mg Oral Daily  . apixaban  5 mg Oral BID  . citalopram  20 mg Oral QHS  . docusate sodium  200 mg Oral BID  . furosemide  40 mg Intravenous Once  . levothyroxine  100 mcg Oral QHS  . mometasone-formoterol  2 puff Inhalation BID  . pantoprazole  40 mg Oral BID  . piperacillin-tazobactam (ZOSYN)  IV  3.375 g Intravenous Q8H  . potassium chloride  40 mEq Oral Once  . tiotropium  18 mcg Inhalation Daily  .  vitamin B-12  1,000 mcg Oral QHS    Infusions: . 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 Quit smoking 2003. Uses CPAP and O2 every night. He is s/p LVAD HM II implanted 01/12/13 under DT criteria. 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 Admitted with hypotension, RUQ pain, and elevated LFTs found to have acute cholecystitis and sepsis.   1. Septic shock: resolved 2. Acute cholecystitis:  s/p percutaneous cholecystostomy tube.  3. 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 Treated with APC and clipping.   4. Atrial fibrillation: Paroxysmal. Amiodarone on hold for now with  elevated LFTs.    5. LVAD: HeartMate II. VAD parameters all looked ok on interrogation. Off anticoagulants.  Place SCDs.  6. AKI: Creatinine improved with hydration  7. NSVT: History of NSVT runs. Will restart amiodarone.  8. LUE Skin Tear- Continue allevyn foam.  9. A/C systolic heart failure  He is doing well s/p percutaneous C-tube. Fluid culture with Klebsiella, pending sensitivities. Continue Zosyn for now.   Volume status remains elevated.  Will give Lasix 40 mg IV x 1 with 40 mEq po K today.   He is in atrial fibrillation with controlled rate this morning.  Has been in and out of atrial fibrillation.  LFTs coming down, will restart amiodarone today.   He is now on Eliquis 5 mg bid for anticoagulation. Hemoglobin stable.  He is on a PPI.   I reviewed the LVAD parameters from today, and compared the results to the patient's prior recorded data.  No programming changes were made.  The LVAD is functioning within specified parameters.  The patient performs  LVAD self-test daily.  LVAD interrogation was negative for any significant power changes, alarms or PI events/speed drops.  LVAD equipment check completed and is in good working order.  Back-up equipment present.   LVAD education done on emergency procedures and precautions and reviewed exit site care.  Length of Stay: 5  Loralie Champagne MD  03/02/2016, 9:16 AM  VAD Team --- VAD ISSUES ONLY--- Pager [phone] (7am - 7am)  Advanced Heart Failure Team  Pager [phone] (M-F; 7a - 4p)  Please contact [facility] for night-coverage after hours (4p -7a ) and weekends on [email or web address]

## 2016-03-02 NOTE — Progress Notes (Signed)
Pt refusing CPAP at this time. I told him to call if he changes his mind.  

## 2016-03-02 NOTE — Progress Notes (Signed)
Subjective: Quite stable from our standpoint, drain site looks fine drainage is clear.  He still feels somewhat SOB, with some wheezing at the lower portions of lung exam.    Objective: Vital signs in last 24 hours: Temp:  [97.4 F (36.3 C)-97.6 F (36.4 C)] 97.4 F (36.3 C) (06/03 0422) Pulse Rate:  [56-81] 56 (06/03 0422) Resp:  [18-26] 18 (06/02 2131) BP: (90-105)/(74-88) 90/76 mmHg (06/03 0422) SpO2:  [95 %-99 %] 99 % (06/03 0422) Weight:  [99.156 kg (218 lb 9.6 oz)] 99.156 kg (218 lb 9.6 oz) (06/03 0422) Last BM Date:  (PTA) 2225 Urine Drain 183 Afebrile, VSS No LFT's this AM WBC remains normal  INR 1.36 No films Intake/Output from previous day: 06/02 0701 - 06/03 0700 In: 1344.7 [P.O.:680; I.V.:414.7; IV Piggyback:250] Out: 2408 [Urine:2225; Drains:183] Intake/Output this shift:    General appearance: alert, cooperative and no distress Resp: clear but some wheezing at bases GI: soft, non-tender; bowel sounds normal; no masses,  no organomegaly and drain working well.  Lab Results:   Recent Labs  03/01/16 0242 03/02/16 0215  WBC 5.6 7.0  HGB 9.1* 8.9*  HCT 31.5* 31.1*  PLT 153 164    BMET  Recent Labs  03/01/16 0242 03/02/16 0215  NA 139 141  K 3.7 3.5  CL 112* 107  CO2 22 28  GLUCOSE 124* 131*  BUN 13 12  CREATININE 0.84 0.93  CALCIUM 8.9 9.2   PT/INR  Recent Labs  03/01/16 0242 03/02/16 0215  LABPROT 15.3* 16.9*  INR 1.20 1.36     Recent Labs Lab 02/26/16 1840 02/27/16 0241 02/28/16 0938 03/01/16 0242  AST 275* 211* 105* 61*  ALT 237* 204* 146* 91*  ALKPHOS 159* 151* 188* 200*  BILITOT 7.4* 7.2* 6.4* 2.7*  PROT 5.2* 5.2* 5.2* 4.9*  ALBUMIN 2.7* 2.4* 2.4* 2.1*     Lipase     Component Value Date/Time   LIPASE 22 02/26/2016 2045     Studies/Results: Ir Perc Cholecystostomy  02/29/2016  INDICATION: 75 year old with acute cholecystitis and multiple medical problems, including ischemic cardiomyopathy with a LVAD. Patient  is not a surgical candidate. EXAM: PERCUTANEOUS CHOLECYSTOSTOMY TUBE PLACEMENT WITH ULTRASOUND AND FLUOROSCOPIC GUIDANCE MEDICATIONS: None ANESTHESIA/SEDATION: Moderate (conscious) sedation was employed during this procedure. A total of Versed 1.0 mg and Fentanyl 25 mcg was administered intravenously. Moderate Sedation Time: 20 minutes. The patient's level of consciousness and vital signs were monitored continuously by radiology nursing throughout the procedure under my direct supervision. FLUOROSCOPY TIME:  Fluoroscopy Time: 42 seconds, 21 mGy COMPLICATIONS: None immediate. PROCEDURE: The procedure was explained to the patient. The risks and benefits of the procedure were discussed and the patient's questions were addressed. Informed consent was obtained from the patient. The right side of the abdomen was evaluated with ultrasound. The gallbladder was identified. The right side of the abdomen was prepped and draped in sterile fashion. Maximal barrier sterile technique was utilized including caps, mask, sterile gowns, sterile gloves, sterile drape, hand hygiene and skin antiseptic. Skin was anesthetized with 1% lidocaine. Using ultrasound guidance, a 21 gauge needle was directed into the distended gallbladder via a transhepatic approach. Needle placement confirmed in the gallbladder. Dark bile was identified. A 0.018 wire was placed and Accustick dilator set was placed. The tract was dilated to accommodate a 10.2 [place] multipurpose drain. The drain was reconstituted in the gallbladder and 75 mL of dark bile was removed. The gallbladder was decompressed at the end of the procedure based on ultrasound.  Fluid was sent for culture. Catheter was attached to gravity bag. Catheter was sutured to skin. Bandage placed over the catheter. Fluoroscopic and ultrasound images were taken and saved for documentation. FINDINGS: Distended gallbladder. Successful placement of a percutaneous cholecystostomy tube via a transhepatic  approach. 75 mL of dark bile was removed. IMPRESSION: Successful percutaneous cholecystostomy tube placement with ultrasound and fluoroscopic guidance. Bile was sent for culture. Electronically Signed   By: Markus Daft M.D.   On: 02/29/2016 11:11    Medications: . apixaban  5 mg Oral BID  . citalopram  20 mg Oral QHS  . docusate sodium  200 mg Oral BID  . levothyroxine  100 mcg Oral QHS  . mometasone-formoterol  2 puff Inhalation BID  . pantoprazole  40 mg Oral BID  . piperacillin-tazobactam (ZOSYN)  IV  3.375 g Intravenous Q8H  . tiotropium  18 mcg Inhalation Daily  . vitamin B-12  1,000 mcg Oral QHS    Assessment/Plan Acute calculous cholecystitis  - s/p PERC drain placement by IR on 02/29/16 (Gm negative rods in bile fluid - culture still pending) GI bleed from AVM S/p LVAD CAD s/p CABG 2003 sCHF s/p ICD PAF on Eliquis(none since admission 5/29) Emphysema/OSA/CPAP with O2 Cirrhosis(child's pugh score 10-C) Hypothyroid FEN: Regular diet ID: Day 6 Zosyn DVT: SCD/Apixaban   Plan: Per DR. Wilson:  "Generally we transition pt to oral abx for 2 weeks for infected gb managed with drain. But given cx showing GNR in setting of LVAD, pt may need IV abx. Needs at least 2 weeks of abx coverage. Drain will stay in at least 6 weeks and then be arranged to have outpt drain study to see if cystic duct open Explained that he has to be in really good shape with normal LFTs before entertain interval cholecystectomy and this is standard of care.  Wife would like pt to see a different surgeon in our group for outpt follow up. We should be able to accommodate her request. "  Nothing new to add at this time, I will recheck LFT's per Dr. Dois Davenport recommendations    LOS: 5 days    Kimaria Struthers 03/02/2016 [phone]

## 2016-03-03 DIAGNOSIS — A419 Sepsis, unspecified organism: Secondary | ICD-10-CM

## 2016-03-03 DIAGNOSIS — R6521 Severe sepsis with septic shock: Secondary | ICD-10-CM

## 2016-03-03 DIAGNOSIS — K8000 Calculus of gallbladder with acute cholecystitis without obstruction: Secondary | ICD-10-CM

## 2016-03-03 LAB — COMPREHENSIVE METABOLIC PANEL
ALBUMIN: 2.2 g/dL — AB (ref 3.5–5.0)
ALK PHOS: 192 U/L — AB (ref 38–126)
ALT: 62 U/L (ref 17–63)
AST: 39 U/L (ref 15–41)
Anion gap: 6 (ref 5–15)
BILIRUBIN TOTAL: 2.1 mg/dL — AB (ref 0.3–1.2)
BUN: 13 mg/dL (ref 6–20)
CO2: 28 mmol/L (ref 22–32)
CREATININE: 0.94 mg/dL (ref 0.61–1.24)
Calcium: 9.5 mg/dL (ref 8.9–10.3)
Chloride: 107 mmol/L (ref 101–111)
GFR calc Af Amer: >60 mL/min (ref >60)
GLUCOSE: 107 mg/dL — AB (ref 65–99)
POTASSIUM: 3.3 mmol/L — AB (ref 3.5–5.1)
Sodium: 141 mmol/L (ref 135–145)
TOTAL PROTEIN: 5.8 g/dL — AB (ref 6.5–8.1)

## 2016-03-03 LAB — CBC
HEMATOCRIT: 31.7 % — AB (ref 39.0–52.0)
HEMOGLOBIN: 9 g/dL — AB (ref 13.0–17.0)
MCH: 26.2 pg (ref 26.0–34.0)
MCHC: 28.4 g/dL — ABNORMAL LOW (ref 30.0–36.0)
MCV: 92.2 fL (ref 78.0–100.0)
Platelets: 193 10*3/uL (ref 150–400)
RBC: 3.44 MIL/uL — AB (ref 4.22–5.81)
RDW: 19.8 % — ABNORMAL HIGH (ref 11.5–15.5)
WBC: 6.7 10*3/uL (ref 4.0–10.5)

## 2016-03-03 LAB — LACTATE DEHYDROGENASE: LDH: 200 U/L — ABNORMAL HIGH (ref 98–192)

## 2016-03-03 LAB — PROTIME-INR
INR: 1.56 — AB (ref 0.00–1.49)
Prothrombin Time: 18.8 seconds — ABNORMAL HIGH (ref 11.6–15.2)

## 2016-03-03 MED ORDER — FUROSEMIDE 40 MG PO TABS
40.0000 mg | ORAL_TABLET | Freq: Every day | ORAL | Status: DC
  Administered 2016-03-03: 40 mg via ORAL
  Filled 2016-03-03: qty 1

## 2016-03-03 MED ORDER — POTASSIUM CHLORIDE CRYS ER 20 MEQ PO TBCR: EXTENDED_RELEASE_TABLET | ORAL | Status: DC

## 2016-03-03 MED ORDER — POTASSIUM CHLORIDE CRYS ER 20 MEQ PO TBCR
40.0000 meq | EXTENDED_RELEASE_TABLET | Freq: Once | ORAL | Status: AC
  Administered 2016-03-03: 40 meq via ORAL
  Filled 2016-03-03: qty 2

## 2016-03-03 MED ORDER — FUROSEMIDE 40 MG PO TABS: ORAL_TABLET | ORAL | Status: DC

## 2016-03-03 MED ORDER — AMIODARONE HCL 200 MG PO TABS: 200.0000 mg | ORAL_TABLET | Freq: Every day | ORAL | Status: DC

## 2016-03-03 MED ORDER — CIPROFLOXACIN HCL 500 MG PO TABS
500.0000 mg | ORAL_TABLET | Freq: Two times a day (BID) | ORAL | Status: DC
  Administered 2016-03-03: 500 mg via ORAL
  Filled 2016-03-03: qty 1

## 2016-03-03 MED ORDER — CIPROFLOXACIN HCL 500 MG PO TABS: 500.0000 mg | ORAL_TABLET | Freq: Two times a day (BID) | ORAL | Status: DC

## 2016-03-03 NOTE — Progress Notes (Signed)
Subjective: He is doing fine going home today.  Family does not have a Psychologist, sport and exercise they prefer.  I will leave information for him to follow up with Dr. Dalbert Batman.    Objective: Vital signs in last 24 hours: Temp:  [97.3 F (36.3 C)-98 F (36.7 C)] 97.5 F (36.4 C) (06/04 0910) Pulse Rate:  [69-71] 70 (06/03 2001) Resp:  [16] 16 (06/04 0910) BP: (84-101)/(74-81) 94/81 mmHg (06/03 2001) SpO2:  [91 %-98 %] 93 % (06/04 0346) Weight:  [99.292 kg (218 lb 14.4 oz)] 99.292 kg (218 lb 14.4 oz) (06/04 0346) Last BM Date:  (PTA)  Intake/Output from previous day: 06/03 0701 - 06/04 0700 In: 730 [P.O.:630; IV Piggyback:100] Out: 1600 [Urine:1550; Drains:50] Intake/Output this shift: Total I/O In: 260 [P.O.:250; I.V.:10] Out: 95 [Drains:95]  General appearance: alert, cooperative and no distress GI: soft, non-tender; bowel sounds normal; no masses,  no organomegaly and drain site looks fine draining well  Lab Results:   Recent Labs  03/02/16 0215 03/03/16 0330  WBC 7.0 6.7  HGB 8.9* 9.0*  HCT 31.1* 31.7*  PLT 164 193    BMET  Recent Labs  03/02/16 0215 03/03/16 0330  NA 141 141  K 3.5 3.3*  CL 107 107  CO2 28 28  GLUCOSE 131* 107*  BUN 12 13  CREATININE 0.93 0.94  CALCIUM 9.2 9.5   PT/INR  Recent Labs  03/02/16 0215 03/03/16 0330  LABPROT 16.9* 18.8*  INR 1.36 1.56*     Recent Labs Lab 02/27/16 0241 02/28/16 0938 03/01/16 0242 03/02/16 0806 03/03/16 0330  AST 211* 105* 61* 43* 39  ALT 204* 146* 91* 74* 62  ALKPHOS 151* 188* 200* 176* 192*  BILITOT 7.2* 6.4* 2.7* 2.3* 2.1*  PROT 5.2* 5.2* 4.9* 5.3* 5.8*  ALBUMIN 2.4* 2.4* 2.1* 2.2* 2.2*     Lipase     Component Value Date/Time   LIPASE 22 02/26/2016 2045     Studies/Results: No results found.  Medications: . amiodarone  200 mg Oral Daily  . apixaban  5 mg Oral BID  . ciprofloxacin  500 mg Oral BID  . citalopram  20 mg Oral QHS  . docusate sodium  200 mg Oral BID  . furosemide  40 mg Oral  Daily  . levothyroxine  100 mcg Oral QHS  . mometasone-formoterol  2 puff Inhalation BID  . pantoprazole  40 mg Oral BID  . tiotropium  18 mcg Inhalation Daily  . vitamin B-12  1,000 mcg Oral QHS   KLEBSIELLA PNEUMONIAE     Antibiotic Sensitivity Microscan Status    AMPICILLIN Resistant >=32 RESISTANT Final    Method: MIC    AMPICILLIN/SULBACTAM Resistant >=32 RESISTANT Final    Method: MIC    CEFAZOLIN Sensitive <=4 SENSITIVE Final    Method: MIC    CEFEPIME Sensitive <=1 SENSITIVE Final    Method: MIC    CEFTAZIDIME Sensitive <=1 SENSITIVE Final    Method: MIC    CEFTRIAXONE Sensitive <=1 SENSITIVE Final    Method: MIC    CIPROFLOXACIN Sensitive <=0.25 SENSITIVE Final    Method: MIC    GENTAMICIN Sensitive <=1 SENSITIVE Final    Method: MIC    IMIPENEM Sensitive <=0.25 SENSITIVE Final    Method: MIC    PIP/TAZO Sensitive 16 SENSITIVE Final    Method: MIC    TRIMETH/SULFA Sensitive <=20 SENSITIVE Final    Method: MIC    Comments KLEBSIELLA PNEUMONIAE (MIC)    KLEBSIELLA PNEUMONIAE  Assessment/Plan Acute calculous cholecystitis  - s/p PERC drain placement by IR on 02/29/16 (Gm negative rods in bile fluid - culture still pending) 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 Wilson: "Generally we transition pt to oral abx for 2 weeks for infected gb managed with drain. But given cx showing GNR in setting of LVAD, pt may need IV abx. Needs at least 2 weeks of abx coverage. Drain will stay in at least 6 weeks and then be arranged to have outpt drain study to see if cystic duct open Explained that he has to be in really good shape with normal LFTs before  entertain interval cholecystectomy and this is standard of care.  I will put information in AVS to follow up with Dr. Dalbert Batman.       LOS: 6 days    Earnstine Regal 03/03/2016 [phone]

## 2016-03-03 NOTE — Progress Notes (Signed)
Pt and wife educated on discharge instructions. Able to teach back medication regimen and how to empty drain. Addressed pt diet and importance of daily weights. Pt discharged home with wife with all belongings.

## 2016-03-03 NOTE — Progress Notes (Signed)
Patient ID: [name], male   DOB: 25-May-1941, 75 y.o.   MRN: [id number]    HeartMate 2 Rounding Note  Subjective:    Underwent placement of percutaneous cholecystotomy tube 6/1 with IR. 75cc of dark bile removed.  Fluid cultures with Klebsiella.  No more abdominal pain, no fever.   Got IV Lasix again yesterday.  Breathing better.  Walking halls.   MAPs stable in 80s.   Wants to go home.   LVAD INTERROGATION:  HeartMate II LVAD:  Flow 5.7 liters/min, speed 9000, power 5.5, PI 6.3.  No PIs.   Objective:    Vital Signs:   Temp:  [97.3 F (36.3 C)-98 F (36.7 C)] 97.7 F (36.5 C) (06/04 0346) Pulse Rate:  [69-71] 70 (06/03 2001) Resp:  [16] 16 (06/04 0346) BP: (84-101)/(74-81) 94/81 mmHg (06/03 2001) SpO2:  [91 %-98 %] 93 % (06/04 0346) Weight:  [218 lb 14.4 oz (99.292 kg)] 218 lb 14.4 oz (99.292 kg) (06/04 0346) Last BM Date:  (PTA) Mean arterial Pressure  80s   Intake/Output:   Intake/Output Summary (Last 24 hours) at 03/03/16 0908 Last data filed at 03/03/16 0800  Gross per 24 hour  Intake    840 ml  Output   1695 ml  Net   -855 ml     Physical Exam: General:  Lying in bed  No resp difficulty.   HEENT: Icterus resolved.  Neck: supple. JVP 8-9 cm. Carotids 2+ bilat; no bruits. No lymphadenopathy or thryomegaly appreciated. Cor: Mechanical heart sounds with LVAD hum present. Lungs: clear Abdomen: soft, RUQ drain  nondistended. No hepatosplenomegaly. No bruits or masses. Good bowel sounds. Driveline: C/D/I; securement device intact and driveline incorporated mild red streak  Extremities: no cyanosis, clubbing, rash. Trace ankle edema.  Neuro: alert & orientedx3, cranial nerves grossly intact. moves all 4 extremities w/o difficulty. Affect pleasant  Telemetry: NSR in 60s.   Labs: Basic Metabolic Panel:  Recent Labs Lab 02/27/16 0241 02/28/16 0938 03/01/16 0242 03/02/16 0215 03/03/16 0330  NA 136 136 139 141 141  K 3.3* 3.9 3.7 3.5 3.3*  CL 105 107  112* 107 107  CO2 25 24 22 28 28   GLUCOSE 88 86 124* 131* 107*  BUN 28* 15 13 12 13   CREATININE 1.13 0.91 0.84 0.93 0.94  CALCIUM 8.9 9.1 8.9 9.2 9.5    Liver Function Tests:  Recent Labs Lab 02/27/16 0241 02/28/16 0938 03/01/16 0242 03/02/16 0806 03/03/16 0330  AST 211* 105* 61* 43* 39  ALT 204* 146* 91* 74* 62  ALKPHOS 151* 188* 200* 176* 192*  BILITOT 7.2* 6.4* 2.7* 2.3* 2.1*  PROT 5.2* 5.2* 4.9* 5.3* 5.8*  ALBUMIN 2.4* 2.4* 2.1* 2.2* 2.2*    Recent Labs Lab 02/26/16 2045  LIPASE 22   No results for input(s): AMMONIA in the last 168 hours.  CBC:  Recent Labs Lab 02/26/16 1840 02/27/16 0241 02/29/16 0438 03/01/16 0242 03/02/16 0215 03/03/16 0330  WBC 10.9* 10.2 4.7 5.6 7.0 6.7  NEUTROABS 10.1*  --   --   --   --   --   HGB 9.4* 9.0* 8.9* 9.1* 8.9* 9.0*  HCT 31.1* 30.4* 29.9* 31.5* 31.1* 31.7*  MCV 90.7 91.0 90.3 91.0 91.7 92.2  PLT 137* 130* 135* 153 164 193    INR:  Recent Labs Lab 02/28/16 0355 02/29/16 0438 03/01/16 0242 03/02/16 0215 03/03/16 0330  INR 1.31 1.29 1.20 1.36 1.56*    Other results:   Imaging: No results found.  Medications:     Scheduled Medications: . amiodarone  200 mg Oral Daily  . apixaban  5 mg Oral BID  . cefTRIAXone (ROCEPHIN)  IV  2 g Intravenous Q24H  . citalopram  20 mg Oral QHS  . docusate sodium  200 mg Oral BID  . levothyroxine  100 mcg Oral QHS  . mometasone-formoterol  2 puff Inhalation BID  . pantoprazole  40 mg Oral BID  . tiotropium  18 mcg Inhalation Daily  . vitamin B-12  1,000 mcg Oral QHS    Infusions: . 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 Quit smoking 2003. Uses CPAP and O2 every night. He is s/p LVAD HM II implanted  01/12/13 under DT criteria. 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 Admitted with hypotension, RUQ pain, and elevated LFTs found to have acute cholecystitis and sepsis.   1. Septic shock: resolved 2. Acute cholecystitis:  s/p percutaneous cholecystostomy tube.  3. 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 Treated with APC and clipping. Now on Eliquis.  4. Atrial fibrillation: Paroxysmal. Back on amiodarone, in NSR this morning.    5. LVAD: HeartMate II. VAD parameters all looked ok on interrogation. Off anticoagulants.  Place SCDs.  6. AKI: Creatinine at baseline now.  7. NSVT: History of NSVT runs. On amiodarone.  8. LUE Skin Tear: Continue allevyn foam.  9. A/C systolic heart failure  He is doing well s/p percutaneous C-tube. Fluid culture with Klebsiella, sensitive to Cipro.  He can go home with Cipro for 7 more days.   Volume better but will give him about 4 days of po Lasix starting today (40 mg daily with 20 KCl).    He is back in NSR this morning, we have restarted his amiodarone.   He is now on Eliquis 5 mg bid for anticoagulation. Hemoglobin stable.  He is on a PPI.   I think that he can go home today.  He will need followup in LVAD clinic at the end of this week.  He is going to go out on Eliquis, will need to get care management to make sure he can get this medication.  Meds for home: Eliquis 5 mg bid, Cipro 500 mg bid x 1 week, Lasix 40 mg daily + KCl 20 daily x 4 days then stop and use prn as he had been doing at home, amiodarone 200 mg daily, Protonix 40 mg bid, noncardiac meds as prior to admission.   I reviewed the LVAD parameters from today, and compared the results to the patient's prior recorded data.  No programming changes were made.  The LVAD is functioning within specified parameters.  The patient performs LVAD self-test daily.  LVAD interrogation was negative for any  significant power changes, alarms or PI events/speed drops.  LVAD equipment check completed and is in good working order.  Back-up equipment present.   LVAD education done on emergency procedures and precautions and reviewed exit site care.  Length of Stay: 6  Loralie Champagne MD  03/03/2016, 9:08 AM  VAD Team --- VAD ISSUES ONLY--- Pager [phone] (7am - 7am)  Advanced Heart Failure Team  Pager [phone] (M-F; 7a - 4p)  Please contact  [facility] for night-coverage after hours (4p -7a ) and weekends on [email or web address]

## 2016-03-03 NOTE — Discharge Summary (Signed)
Discharge Summary    Patient ID: [name],  MRN: [id number], DOB/AGE: [birth year] 75 y.o.  Admit date: 02/26/2016 Discharge date: 03/03/2016  Primary Care Provider: Thressa Wood Primary Cardiologist: Ryan Crow, MD   Discharge Diagnoses    Principal Problem:   Acute calculous cholecystitis  **status post percutaneous cholecystostomy tube this admission.  **GB fluid culture + for Klebsiella pneumoniae  Active Problems:   Ischemic cardiomyopathy   LVAD (left ventricular assist device) present (HCC)   Hypotension/  Septic shock (HCC)   Chronic systolic (congestive) heart failure (HCC)   Coronary artery disease   PAF (paroxysmal atrial fibrillation) (HCC)   Obstructive sleep apnea   COPD (chronic obstructive pulmonary disease) (HCC)   Reactive depression (situational)   Internal hemorrhoids   Symptomatic anemia   AVM (arteriovenous malformation) of small bowel, acquired (HCC)   Chronic Anticoagulation (Eliquis)  Allergies No Known Allergies  Diagnostic Studies/Procedures    IR Percutaneous Cholecystostomy 6.1.2017  Placement of percutaneous cholecystostomy tube via transhepatic approach with [place] and fluoro guidance. Removed 75 ml of dark bile, will send for culture. No immediate complication and minimal blood loss.  _____________   History of Present Illness     75 y/o ? with a h/o CAD s/p CABG (123456), chronic systolic HF s/p ICD with subsequent removal secondary to infection, hyperlipidemia, hypothyroidism, emphysema, OSA, and PAF.  He is s/p LVAD Heartmate II and is chronically anticoagulated on Eliquis.  He also has a complicated h/o chronic GI bleeding with multiple admissions for symptomatic anemia in the setting of small bowel AVM's, epistaxis, and hemorrhoidal bleeding.  He was most recently admitted 5/19 with symptomatic anemia and melena and required 6 units of PRBC's.  Enteroscopy revealed jejunal AVM, which required clipping.  Unfortunately, following  discharge, he began to experience nausea without vomiting, anorexia, and RUQ pain.  He was noted to have scleral icterus by his wife on 5/29, and his wife took him to the ED.  There, he was hypotensive with mild leukocytosis, stable anemia, and markedly elevated LFT's.  He was initially in AFib on arrival, but later converted to sinus rhythm.  Abdominal ultrasound showed cirrhotic liver, cholelithiasis, some percholecystic fluid, and question of acute cholecystitis.  He was placed on vancomycin and zosyn and admitted for further evaluation.  Hospital Course     Consultants: General Surgery Ryan Anis, MD); Interventional Radiology Ryan Plate, DO)  Following admission, a HIDA scan was performed as recommended by radiology, given abdominal ultrasound findings.  This showed acute calculous cholecystitis.  His home doses of amiodarone and simvastatin were initially held in the setting of elevated LFT's.  General surgery was consulted and recommended continued IV antibiotics.  Given co-morbidities including recent hospitalization and chronic eliquis therapy, interventional radiology consultation for cholecystostomy tube placement was recommended with plan for consideration for cholecystectomy in a minimum of six weeks.  Interventional radiology was consulted and [name] underwent successful percutaneous cholecystostomy tube placement on 02/29/2016.  He tolerated the procedure well.  Culture revealed GNR  K pneumoniae.  Eliquis was safely resumed post-procedure and H/H have been stable.  Amiodarone was resumed at a reduced dose of 200 mg daily.  Antibiotics have been transitioned to oral ciprofloxacin following return of sensitivities and this selection has been confirmed with pharmacy.  [name] will be discharged home today in good condition.  In the setting of mild volume overload, he will go home on lasix 40 mg daily and KCL 20 meq daily x 4  days, prior to resuming his prior prn usage of these medications.   Of note, in the setting of ongoing soft blood pressures, we are holding his previous home dose of losartan. We will arrange for follow-up in [facility] clinic later this week and he will follow up with general surgery in the near future to discuss timing of cholecystectomy. _____________  Discharge Vitals Blood pressure 94/81, pulse 70, temperature 97.5 F (36.4 C), temperature source Oral, resp. rate 16, height 6' (1.829 m), weight 218 lb 14.4 oz (99.292 kg), SpO2 95 %.  Filed Weights   03/01/16 0500 03/02/16 0422 03/03/16 0346  Weight: 220 lb 1.5 oz (99.835 kg) 218 lb 9.6 oz (99.156 kg) 218 lb 14.4 oz (99.292 kg)    Labs & Radiologic Studies    CBC  Recent Labs  03/02/16 0215 03/03/16 0330  WBC 7.0 6.7  HGB 8.9* 9.0*  HCT 31.1* 31.7*  MCV 91.7 92.2  PLT 164 0000000   Basic Metabolic Panel  Recent Labs  03/02/16 0215 03/03/16 0330  NA 141 141  K 3.5 3.3*  CL 107 107  CO2 28 28  GLUCOSE 131* 107*  BUN 12 13  CREATININE 0.93 0.94  CALCIUM 9.2 9.5   Liver Function Tests  Recent Labs  03/02/16 0806 03/03/16 0330  AST 43* 39  ALT 74* 62  ALKPHOS 176* 192*  BILITOT 2.3* 2.1*  PROT 5.3* 5.8*  ALBUMIN 2.2* 2.2*  _____________  Dg Chest 1 View  02/26/2016  CLINICAL DATA:  Pneumonia.  Shortness of breath.  Fall tonight. EXAM: CHEST 1 VIEW COMPARISON:  02/16/2016 FINDINGS: Patient is post median sternotomy. Cardiomegaly is again seen, question mild improvement. Left ventricular assist device again seen. Right infrahilar opacity which may be new from prior. Diaphragmatic calcifications bilaterally. No pulmonary edema. No large pleural effusion. IMPRESSION: 1. Right infrahilar opacity may reflect pneumonia. This is likely new from prior exam. 2. Cardiomegaly, grossly stable or minimally improved. No pulmonary edema. Electronically Signed   By: Jeb Levering M.D.   On: 02/26/2016 21:14   Nm Hepatobiliary Liver Func  02/27/2016  CLINICAL DATA:  Right upper quadrant pain. EXAM:  NUCLEAR MEDICINE HEPATOBILIARY IMAGING TECHNIQUE: Sequential images of the abdomen were obtained out to 60 minutes following intravenous administration of radiopharmaceutical. RADIOPHARMACEUTICALS:  5 mCi Tc-41m  Choletec IV COMPARISON:  Sonography from yesterday. FINDINGS: There is adequate tracer uptake into the liver. Photopenic area over the left liver correlates with LVAD. Biliary activity passes into small bowel, consistent with patent common bile duct. Gallbladder was not seen at 2 hours. No hypervascular rim sign. These results will be called to the ordering clinician or representative by the Radiologist Assistant, and communication documented in the PACS or zVision Dashboard. IMPRESSION: 1. Findings of cystic duct obstruction, supportive of acute cholecystitis. 2. Patent common bile duct. Electronically Signed   By: Monte Fantasia M.D.   On: 02/27/2016 12:26   Ir Perc Cholecystostomy  02/29/2016  INDICATION: 75 year old with acute cholecystitis and multiple medical problems, including ischemic cardiomyopathy with a LVAD. Patient is not a surgical candidate. EXAM: PERCUTANEOUS CHOLECYSTOSTOMY TUBE PLACEMENT WITH ULTRASOUND AND FLUOROSCOPIC GUIDANCE MEDICATIONS: None ANESTHESIA/SEDATION: Moderate (conscious) sedation was employed during this procedure. A total of Versed 1.0 mg and Fentanyl 25 mcg was administered intravenously. Moderate Sedation Time: 20 minutes. The patient's level of consciousness and vital signs were monitored continuously by radiology nursing throughout the procedure under my direct supervision. FLUOROSCOPY TIME:  Fluoroscopy Time: 42 seconds, 21 mGy COMPLICATIONS: None immediate. PROCEDURE:  The procedure was explained to the patient. The risks and benefits of the procedure were discussed and the patient's questions were addressed. Informed consent was obtained from the patient. The right side of the abdomen was evaluated with ultrasound. The gallbladder was identified. The right side  of the abdomen was prepped and draped in sterile fashion. Maximal barrier sterile technique was utilized including caps, mask, sterile gowns, sterile gloves, sterile drape, hand hygiene and skin antiseptic. Skin was anesthetized with 1% lidocaine. Using ultrasound guidance, a 21 gauge needle was directed into the distended gallbladder via a transhepatic approach. Needle placement confirmed in the gallbladder. Dark bile was identified. A 0.018 wire was placed and Accustick dilator set was placed. The tract was dilated to accommodate a 10.2 [place] multipurpose drain. The drain was reconstituted in the gallbladder and 75 mL of dark bile was removed. The gallbladder was decompressed at the end of the procedure based on ultrasound. Fluid was sent for culture. Catheter was attached to gravity bag. Catheter was sutured to skin. Bandage placed over the catheter. Fluoroscopic and ultrasound images were taken and saved for documentation. FINDINGS: Distended gallbladder. Successful placement of a percutaneous cholecystostomy tube via a transhepatic approach. 75 mL of dark bile was removed. IMPRESSION: Successful percutaneous cholecystostomy tube placement with ultrasound and fluoroscopic guidance. Bile was sent for culture. Electronically Signed   By: Markus Daft M.D.   On: 02/29/2016 11:11   US Abdomen Limited Ruq  02/26/2016  CLINICAL DATA:  Elevated transaminase level. EXAM: US ABDOMEN LIMITED - RIGHT UPPER QUADRANT COMPARISON:  CT 03/09/2014 FINDINGS: Gallbladder: Distended. Gallstones are noted with a 1.1 cm stone in the gallbladder neck. Mild intraluminal sludge. Gallbladder wall thickness measures 3-6 mm. Small amount of pericholecystic fluid. No sonographic Pucciarelli sign noted by sonographer. Common bile duct: Diameter: 3 mm Liver: No focal lesion identified. Diffusely increased in parenchymal echogenicity. Nodular capsular contours. Normal directional flow in the main portal vein. IMPRESSION: 1. Cirrhotic hepatic  morphology with nodular contours and increased echogenicity, similar to prior CT. No focal lesion is seen sonographically. 2. Cholelithiasis. Mild gallbladder distention or wall thickening. Small amount of pericholecystic fluid. Wall thickening and pericholecystic fluid are common findings in chronic liver disease, however acute cholecystitis can have this appearance. Nuclear medicine HIDA scan may be of value based on clinical concern. 3. No biliary dilatation. Electronically Signed   By: Jeb Levering M.D.   On: 02/26/2016 21:12   Disposition   Pt is being discharged home today in good condition.  Follow-up Plans & Appointments    Follow-up Information    Follow up with Glori Bickers, MD.   Specialty:  Cardiology   Why:  LVAD clinic  - We will arrange for later this week and contact you.   Contact information:   [place] [address] [phone]       Follow up with Adin Hector, MD.   Specialty:  General Surgery   Why:  Call tomorrow and make an appointment for about 4 weeks.     Contact information:   [address] [place] [address] [phone]        Discharge Medications   Current Discharge Medication List    START taking these medications   Details  ciprofloxacin (CIPRO) 500 MG tablet Take 1 tablet (500 mg total) by mouth 2 (two) times daily. Qty: 14 tablet, Refills: 0      CONTINUE these medications which have CHANGED   Details  amiodarone (PACERONE) 200 MG tablet Take 1 tablet (200 mg total) by mouth daily. Qty: 90 tablet, Refills: 3   Associated Diagnoses: Atrial fibrillation, unspecified; Essential hypertension; Left ventricular assist device present ([place]); On warfarin therapy; Systolic congestive heart failure, unspecified congestive heart failure chronicity (HCC)    furosemide (LASIX) 40 MG tablet 40 mg daily x 4 days and then resume taking as needed. Qty: 30 tablet   Associated Diagnoses: Chronic  systolic heart failure (HCC)    potassium chloride SA (K-DUR,KLOR-CON) 20 MEQ tablet 20 meq Daily x 4 days and then resume taking PRN when using lasix.      CONTINUE these medications which have NOT CHANGED   Details  albuterol (PROVENTIL) (2.5 MG/3ML) 0.083% nebulizer solution Take 2.5 mg by nebulization every 6 (six) hours as needed for wheezing or shortness of breath.    apixaban (ELIQUIS) 5 MG TABS tablet Take 1 tablet (5 mg total) by mouth 2 (two) times daily. Qty: 60 tablet, Refills: 11    budesonide-formoterol (SYMBICORT) 160-4.5 MCG/ACT inhaler Inhale 2 puffs into the lungs 2 (two) times daily. Qty: 1 Inhaler, Refills: 3   Associated Diagnoses: Atrial fibrillation, unspecified; Essential hypertension; Left ventricular assist device present ([place]); On warfarin therapy; Systolic congestive heart failure, unspecified congestive heart failure chronicity (HCC)    cholecalciferol (VITAMIN D) 1000 UNITS tablet Take 1 tablet (1,000 Units total) by mouth 2 (two) times daily. Qty: 180 tablet, Refills: 3   Associated Diagnoses: Atrial fibrillation, unspecified; Essential hypertension; Left ventricular assist device present ([place]); On warfarin therapy; Systolic congestive heart failure, unspecified congestive heart failure chronicity (HCC)    citalopram (CELEXA) 40 MG tablet Take 1 tablet (40 mg total) by mouth at bedtime. Qty: 90 tablet, Refills: 3   Associated Diagnoses: Atrial fibrillation, unspecified; Essential hypertension; Left ventricular assist device present ([place]); On warfarin therapy; Systolic congestive heart failure, unspecified congestive heart failure chronicity (HCC)    docusate sodium (COLACE) 100 MG capsule Take 200 mg by mouth 2 (two) times daily.    hydrocortisone (ANUSOL-HC) 2.5 % rectal cream Place 1 application rectally 2 (two) times daily. Qty: 30 g, Refills: 6   Associated Diagnoses: Hemorrhoids, unspecified hemorrhoid type; Left ventricular assist device present ([place]);  Atrial fibrillation, unspecified; PAF (paroxysmal atrial fibrillation) (HCC)    levothyroxine (SYNTHROID, LEVOTHROID) 100 MCG tablet Take 1 tablet (100 mcg total) by mouth at bedtime. Qty: 30 tablet, Refills: 6    Multiple Vitamin (MULTIVITAMIN WITH MINERALS) TABS tablet Take 1 tablet by mouth daily.    nitroGLYCERIN (NITROSTAT) 0.4 MG SL tablet Place 0.4 mg under the tongue every 5 (five) minutes as needed for chest pain. Reported on 01/23/2016    pantoprazole (PROTONIX) 40 MG tablet Take 1 tablet (40 mg total) by mouth 2 (two) times daily. Qty: 180 tablet, Refills: 3   Associated Diagnoses: Atrial fibrillation, unspecified; Essential hypertension; Left ventricular assist device present ([place]); On warfarin therapy; Systolic congestive heart failure, unspecified congestive heart failure chronicity (HCC)    simvastatin (ZOCOR) 80 MG tablet Take 0.5 tablets (40 mg total) by mouth at bedtime. Qty: 45 tablet, Refills: 3   Associated Diagnoses: Atrial fibrillation, unspecified; Essential hypertension; Left ventricular assist device present ([place]); On warfarin therapy; Systolic congestive heart failure, unspecified congestive heart failure chronicity (HCC)    tiotropium (SPIRIVA) 18 MCG inhalation capsule Place 1 capsule (18 mcg total) into inhaler and inhale daily. Qty: 90 capsule, Refills: 3   Associated Diagnoses: Atrial fibrillation, unspecified; Essential hypertension; Left ventricular assist device present ([place]);  On warfarin therapy; Systolic congestive heart failure, unspecified congestive heart failure chronicity (HCC)    vitamin B-12 (CYANOCOBALAMIN) 1000 MCG tablet Take 1,000 mcg by mouth at bedtime.    vitamin C (ASCORBIC ACID) 500 MG tablet Take 1 tablet (500 mg total) by mouth 2 (two) times daily. Qty: 180 tablet, Refills: 3   Associated Diagnoses: Atrial fibrillation, unspecified; Essential hypertension; Left ventricular assist device present ([place]); On warfarin therapy; Systolic  congestive heart failure, unspecified congestive heart failure chronicity ([place])      STOP taking these medications     losartan (COZAAR) 25 MG tablet         Outstanding Labs/Studies   None  Duration of Discharge Encounter   Greater than 30 minutes including physician time.  Signed, Murray Hodgkins NP 03/03/2016, 10:24 AM

## 2016-03-04 ENCOUNTER — Other Ambulatory Visit: Payer: No Typology Code available for payment source

## 2016-03-04 ENCOUNTER — Encounter: Payer: Medicare Other | Admitting: Hematology

## 2016-03-04 ENCOUNTER — Ambulatory Visit: Payer: Medicare Other

## 2016-03-04 ENCOUNTER — Telehealth (HOSPITAL_COMMUNITY): Payer: Self-pay | Admitting: Infectious Diseases

## 2016-03-04 ENCOUNTER — Encounter: Payer: Self-pay | Admitting: Hematology

## 2016-03-04 LAB — CULTURE, BODY FLUID W GRAM STAIN -BOTTLE

## 2016-03-04 LAB — CULTURE, BODY FLUID-BOTTLE

## 2016-03-04 NOTE — Progress Notes (Signed)
This encounter was created in error - please disregard.

## 2016-03-04 NOTE — Telephone Encounter (Signed)
Called [name] re: hospital DC FU appt. Reviewing DC summary requested FU in "[facility] by the end of the week" however they are going out of town camping through the week of the 27th. Appointment made 6/29 for 0900.   Upset that we did not give Randale FeSO4 in the hospital with recent admission as he has missed several infusion appointments with Dr. Burr Medico. They cancelled the infusion appointment today 03/04/16 because he was very tired. Next appt is 6/26. Will attempt to arrange for Octreotide-LAR IM inj at this time. Discussed we would like to get this for him earlier rather than later and that we could make an appointment at our Medical Day here to administer it, however they will wait until next FeSO4 infusion appt.   Requesting if I can find the approval for the [facility] visits from December 20, 2015 - December 18, 2016--I told her that I have not seen any approval fax at this time, however will look in the scanned documents and his shadow chart in our office.   Appointment made for 03/28/16 @ 0900. They will FU with Dr. Dalbert Batman ([facility]) later this morning at 1030 to discuss cholecystectomy--will need to ensure LFTs done at this clinic visit and fax to their office.   Janene Madeira, RN [facility] Coordinator   Office: [phone] 24/7 [facility] Pager: [phone]

## 2016-03-05 ENCOUNTER — Telehealth: Payer: Self-pay | Admitting: Hematology

## 2016-03-05 ENCOUNTER — Telehealth: Payer: Self-pay | Admitting: *Deleted

## 2016-03-05 ENCOUNTER — Other Ambulatory Visit: Payer: Self-pay | Admitting: *Deleted

## 2016-03-05 NOTE — Telephone Encounter (Signed)
Per staff message and POF I have scheduled appts. Advised scheduler of appts. JMW  

## 2016-03-05 NOTE — Telephone Encounter (Signed)
pt wife cld to r/s appt, Gave r/s time & date for appt 6/13@2 :23

## 2016-03-05 NOTE — Telephone Encounter (Signed)
per pof to r/s appt-cld pt and left a message to call and r/s no show appt for 6/5

## 2016-03-11 ENCOUNTER — Other Ambulatory Visit: Payer: Medicare Other

## 2016-03-11 ENCOUNTER — Ambulatory Visit: Payer: Medicare Other | Admitting: Hematology

## 2016-03-12 ENCOUNTER — Telehealth: Payer: Self-pay | Admitting: Hematology

## 2016-03-12 ENCOUNTER — Other Ambulatory Visit (HOSPITAL_BASED_OUTPATIENT_CLINIC_OR_DEPARTMENT_OTHER): Payer: Medicare Other

## 2016-03-12 ENCOUNTER — Ambulatory Visit (HOSPITAL_BASED_OUTPATIENT_CLINIC_OR_DEPARTMENT_OTHER): Payer: Medicare Other

## 2016-03-12 ENCOUNTER — Encounter: Payer: Self-pay | Admitting: Hematology

## 2016-03-12 ENCOUNTER — Ambulatory Visit (HOSPITAL_BASED_OUTPATIENT_CLINIC_OR_DEPARTMENT_OTHER): Payer: Medicare Other | Admitting: Hematology

## 2016-03-12 VITALS — BP 88/63 | HR 56 | Temp 97.8°F | Resp 18 | Ht 72.0 in | Wt 217.6 lb

## 2016-03-12 DIAGNOSIS — K598 Other specified functional intestinal disorders: Secondary | ICD-10-CM

## 2016-03-12 DIAGNOSIS — D509 Iron deficiency anemia, unspecified: Secondary | ICD-10-CM | POA: Diagnosis not present

## 2016-03-12 DIAGNOSIS — D696 Thrombocytopenia, unspecified: Secondary | ICD-10-CM | POA: Diagnosis not present

## 2016-03-12 DIAGNOSIS — I255 Ischemic cardiomyopathy: Secondary | ICD-10-CM

## 2016-03-12 DIAGNOSIS — Z95811 Presence of heart assist device: Secondary | ICD-10-CM | POA: Diagnosis not present

## 2016-03-12 DIAGNOSIS — D500 Iron deficiency anemia secondary to blood loss (chronic): Secondary | ICD-10-CM

## 2016-03-12 DIAGNOSIS — Q2733 Arteriovenous malformation of digestive system vessel: Secondary | ICD-10-CM

## 2016-03-12 DIAGNOSIS — D649 Anemia, unspecified: Secondary | ICD-10-CM

## 2016-03-12 DIAGNOSIS — D62 Acute posthemorrhagic anemia: Secondary | ICD-10-CM

## 2016-03-12 DIAGNOSIS — K922 Gastrointestinal hemorrhage, unspecified: Secondary | ICD-10-CM

## 2016-03-12 LAB — CBC & DIFF AND RETIC
BASO%: 0.2 % (ref 0.0–2.0)
Basophils Absolute: 0 10*3/uL (ref 0.0–0.1)
EOS%: 2.4 % (ref 0.0–7.0)
Eosinophils Absolute: 0.1 10*3/uL (ref 0.0–0.5)
HEMATOCRIT: 34.3 % — AB (ref 38.4–49.9)
HGB: 10.1 g/dL — ABNORMAL LOW (ref 13.0–17.1)
Immature Retic Fract: 15.7 % — ABNORMAL HIGH (ref 3.00–10.60)
LYMPH#: 0.5 10*3/uL — AB (ref 0.9–3.3)
LYMPH%: 9.9 % — ABNORMAL LOW (ref 14.0–49.0)
MCH: 27.5 pg (ref 27.2–33.4)
MCHC: 29.4 g/dL — AB (ref 32.0–36.0)
MCV: 93.5 fL (ref 79.3–98.0)
MONO#: 0.5 10*3/uL (ref 0.1–0.9)
MONO%: 8.7 % (ref 0.0–14.0)
NEUT%: 78.8 % — ABNORMAL HIGH (ref 39.0–75.0)
NEUTROS ABS: 4.2 10*3/uL (ref 1.5–6.5)
Platelets: 200 10*3/uL (ref 140–400)
RBC: 3.67 10*6/uL — AB (ref 4.20–5.82)
RDW: 20.1 % — ABNORMAL HIGH (ref 11.0–14.6)
RETIC %: 5.06 % — AB (ref 0.80–1.80)
RETIC CT ABS: 185.7 10*3/uL — AB (ref 34.80–93.90)
WBC: 5.4 10*3/uL (ref 4.0–10.3)

## 2016-03-12 LAB — IRON AND TIBC
%SAT: 8 % — ABNORMAL LOW (ref 20–55)
IRON: 26 ug/dL — AB (ref 42–163)
TIBC: 323 ug/dL (ref 202–409)
UIBC: 297 ug/dL (ref 117–376)

## 2016-03-12 LAB — FERRITIN: Ferritin: 51 ng/ml (ref 22–316)

## 2016-03-12 MED ORDER — SODIUM CHLORIDE 0.9 % IV SOLN
Freq: Once | INTRAVENOUS | Status: AC
  Administered 2016-03-12: 16:00:00 via INTRAVENOUS

## 2016-03-12 MED ORDER — OCTREOTIDE ACETATE 20 MG IM KIT
20.0000 mg | PACK | Freq: Once | INTRAMUSCULAR | Status: AC
  Administered 2016-03-12: 20 mg via INTRAMUSCULAR
  Filled 2016-03-12: qty 1

## 2016-03-12 MED ORDER — SODIUM CHLORIDE 0.9 % IV SOLN
510.0000 mg | Freq: Once | INTRAVENOUS | Status: AC
  Administered 2016-03-12: 510 mg via INTRAVENOUS
  Filled 2016-03-12: qty 17

## 2016-03-12 NOTE — Patient Instructions (Signed)
Ferumoxytol injection What is this medicine? FERUMOXYTOL is an iron complex. Iron is used to make healthy red blood cells, which carry oxygen and nutrients throughout the body. This medicine is used to treat iron deficiency anemia in people with chronic kidney disease. This medicine may be used for other purposes; ask your health care provider or pharmacist if you have questions. What should I tell my health care provider before I take this medicine? They need to know if you have any of these conditions: -anemia not caused by low iron levels -high levels of iron in the blood -magnetic resonance imaging (MRI) test scheduled -an unusual or allergic reaction to iron, other medicines, foods, dyes, or preservatives -pregnant or trying to get pregnant -breast-feeding How should I use this medicine? This medicine is for injection into a vein. It is given by a health care professional in a hospital or clinic setting. Talk to your pediatrician regarding the use of this medicine in children. Special care may be needed. Overdosage: If you think you have taken too much of this medicine contact a poison control center or emergency room at once. NOTE: This medicine is only for you. Do not share this medicine with others. What if I miss a dose? It is important not to miss your dose. Call your doctor or health care professional if you are unable to keep an appointment. What may interact with this medicine? This medicine may interact with the following medications: -other iron products This list may not describe all possible interactions. Give your health care provider a list of all the medicines, herbs, non-prescription drugs, or dietary supplements you use. Also tell them if you smoke, drink alcohol, or use illegal drugs. Some items may interact with your medicine. What should I watch for while using this medicine? Visit your doctor or healthcare professional regularly. Tell your doctor or healthcare  professional if your symptoms do not start to get better or if they get worse. You may need blood work done while you are taking this medicine. You may need to follow a special diet. Talk to your doctor. Foods that contain iron include: whole grains/cereals, dried fruits, beans, or peas, leafy green vegetables, and organ meats (liver, kidney). What side effects may I notice from receiving this medicine? Side effects that you should report to your doctor or health care professional as soon as possible: -allergic reactions like skin rash, itching or hives, swelling of the face, lips, or tongue -breathing problems -changes in blood pressure -feeling faint or lightheaded, falls -fever or chills -flushing, sweating, or hot feelings -swelling of the ankles or feet Side effects that usually do not require medical attention (Report these to your doctor or health care professional if they continue or are bothersome.): -diarrhea -headache -nausea, vomiting -stomach pain This list may not describe all possible side effects. Call your doctor for medical advice about side effects. You may report side effects to FDA at 1-800-FDA-1088. Where should I keep my medicine? This drug is given in a hospital or clinic and will not be stored at home. NOTE: This sheet is a summary. It may not cover all possible information. If you have questions about this medicine, talk to your doctor, pharmacist, or health care provider.    2016, [employer]. (2012-05-01 15:23:36)  Octreotide injection solution What is this medicine? OCTREOTIDE (ok TREE oh tide) is used to reduce blood levels of growth hormone in patients with a condition called acromegaly. This medicine also reduces flushing and watery diarrhea  caused by certain types of cancer. This medicine may be used for other purposes; ask your health care provider or pharmacist if you have questions. What should I tell my health care provider before I take  this medicine? They need to know if you have any of these conditions: -gallbladder disease -kidney disease -liver disease -an unusual or allergic reaction to octreotide, other medicines, foods, dyes, or preservatives -pregnant or trying to get pregnant -breast-feeding How should I use this medicine? This medicine is for injection under the skin or into a vein (only in emergency situations). It is usually given by a health care professional in a hospital or clinic setting. If you get this medicine at home, you will be taught how to prepare and give this medicine. Allow the injection solution to come to room temperature before use. Do not warm it artificially. Use exactly as directed. Take your medicine at regular intervals. Do not take your medicine more often than directed. It is important that you put your used needles and syringes in a special sharps container. Do not put them in a trash can. If you do not have a sharps container, call your pharmacist or healthcare provider to get one. Talk to your pediatrician regarding the use of this medicine in children. Special care may be needed. Overdosage: If you think you have taken too much of this medicine contact a poison control center or emergency room at once. NOTE: This medicine is only for you. Do not share this medicine with others. What if I miss a dose? If you miss a dose, take it as soon as you can. If it is almost time for your next dose, take only that dose. Do not take double or extra doses. What may interact with this medicine? Do not take this medicine with any of the following medications: -cisapride -droperidol -general anesthetics -grepafloxacin -perphenazine -thioridazine This medicine may also interact with the following medications: -bromocriptine -cyclosporine -diuretics -medicines for blood pressure, heart disease, irregular heart beat -medicines for diabetes, including insulin -quinidine This list may not describe  all possible interactions. Give your health care provider a list of all the medicines, herbs, non-prescription drugs, or dietary supplements you use. Also tell them if you smoke, drink alcohol, or use illegal drugs. Some items may interact with your medicine. What should I watch for while using this medicine? Visit your doctor or health care professional for regular checks on your progress. To help reduce irritation at the injection site, use a different site for each injection and make sure the solution is at room temperature before use. This medicine may cause increases or decreases in blood sugar. Signs of high blood sugar include frequent urination, unusual thirst, flushed or dry skin, difficulty breathing, drowsiness, stomach ache, nausea, vomiting or dry mouth. Signs of low blood sugar include chills, cool, pale skin or cold sweats, drowsiness, extreme hunger, fast heartbeat, headache, nausea, nervousness or anxiety, shakiness, trembling, unsteadiness, tiredness, or weakness. Contact your doctor or health care professional right away if you experience any of these symptoms. What side effects may I notice from receiving this medicine? Side effects that you should report to your doctor or health care professional as soon as possible: -allergic reactions like skin rash, itching or hives, swelling of the face, lips, or tongue -changes in blood sugar -changes in heart rate -severe stomach pain Side effects that usually do not require medical attention (report to your doctor or health care professional if they continue or are bothersome): -diarrhea  or constipation -gas or stomach pain -nausea, vomiting -pain, redness, swelling and irritation at site where injected This list may not describe all possible side effects. Call your doctor for medical advice about side effects. You may report side effects to FDA at 1-800-FDA-1088. Where should I keep my medicine? Keep out of the reach of children. Store  in a refrigerator between 2 and 8 degrees C (36 and 46 degrees F). Protect from light. Allow to come to room temperature naturally. Do not use artificial heat. If protected from light, the injection may be stored at room temperature between 20 and 30 degrees C (70 and 86 degrees F) for 14 days. After the initial use, throw away any unused portion of a multiple dose vial after 14 days. Throw away unused portions of the ampules after use. NOTE: This sheet is a summary. It may not cover all possible information. If you have questions about this medicine, talk to your doctor, pharmacist, or health care provider.    2016, [employer]. (2008-04-12 16:56:04)

## 2016-03-12 NOTE — Progress Notes (Signed)
[name]  [phone] [phone]) [phone]  Clinic Follow Up Note   Patient Care Team: Thressa Sheller, MD as PCP - General (Internal Medicine) Jolaine Artist, MD as Attending Physician (Cardiology) Ivin Poot, MD as Attending Physician (Cardiothoracic Surgery) 03/12/2016  CHIEF COMPLAINTS:  Follow up anemia  HISTORY OF PRESENTING ILLNESS (11/01/2014):  [name] 75 y.o. male is here because of anemia.   He has extensive cardiac history. He had multiple heart attacks in the past 15 years, he had cardiac bypass surgery in 2003, PCI in 2007, long-standing history of , a atrial fibrillationnd was found to have ischemic cardiomyopathy with EF 27% in 2012.  He initially had AICD placed, and finally had LVAD placed in 2014 for his end-stage heart failure. He has been on Coumadin for more than 10 years for the cardiac issues.   He reports he has had anemia for several years, he used to take iron pill. He also received some IV iron a few years ago. In our electronic medical records, his lab showed anemia since 12/2012.   He had 3 episodes of GI bleeding since June of 2015 which are required blood transfusion, last episode was one month ago and he required 4u RBC  at that time. His EGD showed area of bleeding from duodenum all jejunum and he was treated endoscopyly.   He feels well overall. He denies significant chest pain or abdominal discomfort. He has mild dyspnea on moderate exertion. He has mild to moderate fatigue, but is able to function well at home,  does all his ADLs and daily activities.  TREATMENT: 1. IV Feraheme 510 mg as needed, received in 10/2014, 01/2015, 03/2015, 06/2015, 08/2015, 10/2015, 11/2015, 12/22/2015, 01/22/2016, 01/29/2016, blood transfusion as needed if Hb<9 2. 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 He was admitted to  [facility] on 02/16/2016 for severe anemia. 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 He was also started on for AVM and tolerated well. He was admitted to [facility] again on May 29 for acute cholecystitis, and had been cutaneous bile drainage. He has been doing better since hospital discharge, the right upper quadrant abdominal pain has nearly resolved, no fever or chills, he denies any recent episodes of GI bleeding. His energy level is decent, no other new complaints. He is here for Feraheme infusion and initiating Sandostatin injection for his AVM, which was ordered by his cardiologist.     MEDICAL HISTORY:  Past Medical History  Diagnosis Date  . Ischemic cardiomyopathy      CABG 2003, PCI 2007  EF 27%(myoview 2012  . Chronic systolic heart failure   . Hyperlipidemia   . Hypothyroidism   . Chronic anticoagulation     Afib and LVAD in 2014   . Obesity   . COPD (chronic obstructive pulmonary disease)   . Asbestosis(501)     "6 years in the [place]" (05/24/2013)  . Atrial fibrillation     permanent  . Paroxysmal ventricular tachycardia   . Coronary artery disease   . Implantable cardioverter-defibrillator Medtronic   . Hypertension   . Myocardial infarction 1990's-2000    "2 in  ~ the 1990's; 1 in ~ 2000" (05/24/2013)  . OSA on CPAP   . Depression   . LVAD (left ventricular assist device) present 12/2012  SURGICAL HISTORY: Past Surgical History  Procedure Laterality Date  . Coronary artery bypass graft  2003    "?X4" (05/24/2013)  . Cardiac defibrillator placement  2004; ~ 2010    "cut it out in 2016 after it got infected"   . Insertion of implantable left ventricular assist device N/A 01/12/2013    Procedure: INSERTION OF IMPLANTABLE LEFT VENTRICULAR ASSIST DEVICE;  Surgeon: Ivin Poot, MD;  Location: [place];  Service: Open Heart Surgery;  Laterality: N/A;   nitric oxide; Redo sternotomy  . Intraoperative  transesophageal echocardiogram N/A 01/12/2013    Procedure: INTRAOPERATIVE TRANSESOPHAGEAL ECHOCARDIOGRAM;  Surgeon: Ivin Poot, MD;  Location: [place];  Service: Open Heart Surgery;  Laterality: N/A;  . Colonoscopy N/A 03/09/2014    Procedure: COLONOSCOPY;  Surgeon: Inda Castle, MD;  Location: [place];  Service: Endoscopy;  Laterality: N/A;  LVAD  patient  . Esophagogastroduodenoscopy N/A 03/09/2014    Procedure: ESOPHAGOGASTRODUODENOSCOPY (EGD);  Surgeon: Inda Castle, MD;  Location: [place];  Service: Endoscopy;  Laterality: N/A;  . Givens capsule study N/A 03/10/2014    Procedure: GIVENS CAPSULE STUDY;  Surgeon: Inda Castle, MD;  Location: [place];  Service: Endoscopy;  Laterality: N/A;  . Enteroscopy N/A 07/27/2014    Procedure: ENTEROSCOPY;  Surgeon: Gatha Mayer, MD;  Location: [place];  Service: Endoscopy;  Laterality: N/A;  LVAD patient   . Colonoscopy N/A 07/29/2014    Procedure: COLONOSCOPY;  Surgeon: Gatha Mayer, MD;  Location: [place];  Service: Endoscopy;  Laterality: N/A;  . Givens capsule study N/A 07/30/2014    Procedure: GIVENS CAPSULE STUDY;  Surgeon: Gatha Mayer, MD;  Location: [place];  Service: Endoscopy;  Laterality: N/A;  . Esophagogastroduodenoscopy N/A 08/01/2014    Procedure: ESOPHAGOGASTRODUODENOSCOPY (EGD);  Surgeon: Jerene Bears, MD;  Location: [facility] ENDOSCOPY;  Service: Endoscopy;  Laterality: N/A;  LVAD patient  . Left and right heart catheterization with coronary/graft angiogram  01/07/2013    Procedure: LEFT AND RIGHT HEART CATHETERIZATION WITH Beatrix Fetters;  Surgeon: Jolaine Artist, MD;  Location: [facility] CATH LAB;  Service: Cardiovascular;;  . Enteroscopy N/A 09/27/2014    Procedure: ENTEROSCOPY;  Surgeon: Gatha Mayer, MD;  Location: [facility] ENDOSCOPY;  Service: Endoscopy;  Laterality: N/A;  . Icd lead removal Left 03/16/2015    Procedure: ICD LEAD REMOVAL/EXTRACTION;  Surgeon: Evans Lance, MD;  Location:  [name];  Service: Cardiovascular;  Laterality: Left;  PATIENT HAS LVAD  DR. VAN TRIGT TO BACK UP EXTRACTION  . Appendectomy    . Enteroscopy N/A 11/09/2015    Procedure: ENTEROSCOPY;  Surgeon: Mauri Pole, MD;  Location[facility] ENDOSCOPY;  Service: Endoscopy;  Laterality: N/A;  LVAD  . Givens capsule study N/A 12/11/2015    Procedure: GIVENS CAPSULE STUDY;  Surgeon: Gatha Mayer, MD;  Location: [place];  Service: Endoscopy;  Laterality: N/A;  . Enteroscopy N/A 12/13/2015    Procedure: ENTEROSCOPY;  Surgeon: Milus Banister, MD;  Location: [place];  Service: Endoscopy;  Laterality: N/A;  . Enteroscopy N/A 02/21/2016    Procedure: ENTEROSCOPY;  Surgeon: Milus Banister, MD;  Location: [place];  Service: Endoscopy;  Laterality: N/A;    SOCIAL HISTORY: Social History   Social History  . Marital Status: Married    Spouse Name: N/A  . Number of Children: N/A  . Years of Education: N/A   Occupational History  . Not on file.   Social History Main Topics  . Smoking  status: Former Smoker -- 2.00 packs/day for 45 years    Types: Cigarettes    Quit date: 11/11/2001  . Smokeless tobacco: Never Used  . Alcohol Use: Yes     Comment: 05/24/2013 "use to drink beer; hardly nothing since 2003; nothing at all since 12/2011 when I got my LVAD  "  . Drug Use: No  . Sexual Activity: No   Other Topics Concern  . Not on file   Social History Narrative    FAMILY HISTORY: Family History  Problem Relation Age of Onset  . Heart attack Mother   . Heart attack Father     ALLERGIES:  has No Known Allergies.  MEDICATIONS:  Current Outpatient Prescriptions  Medication Sig Dispense Refill  . albuterol (PROVENTIL) (2.5 MG/3ML) 0.083% nebulizer solution Take 2.5 mg by nebulization every 6 (six) hours as needed for wheezing or shortness of breath.    [name] amiodarone (PACERONE) 200 MG tablet Take 1 tablet (200 mg total) by mouth daily. (Patient taking differently: Take 200 mg by mouth 2  (two) times daily. ) 90 tablet 3  . apixaban (ELIQUIS) 5 MG TABS tablet Take 1 tablet (5 mg total) by mouth 2 (two) times daily. 60 tablet 11  . budesonide-formoterol (SYMBICORT) 160-4.5 MCG/ACT inhaler Inhale 2 puffs into the lungs 2 (two) times daily. 1 Inhaler 3  . cholecalciferol (VITAMIN D) 1000 UNITS tablet Take 1 tablet (1,000 Units total) by mouth 2 (two) times daily. 180 tablet 3  . citalopram (CELEXA) 40 MG tablet Take 1 tablet (40 mg total) by mouth at bedtime. 90 tablet 3  . docusate sodium (COLACE) 100 MG capsule Take 200 mg by mouth 2 (two) times daily.    . furosemide (LASIX) 40 MG tablet 40 mg daily x 4 days and then resume taking as needed. 30 tablet   . hydrocortisone (ANUSOL-HC) 2.5 % rectal cream Place 1 application rectally 2 (two) times daily. (Patient taking differently: Place 1 application rectally 2 (two) times daily as needed for hemorrhoids. ) 30 g 6  . levothyroxine (SYNTHROID, LEVOTHROID) 100 MCG tablet Take 1 tablet (100 mcg total) by mouth at bedtime. 30 tablet 6  . Multiple Vitamin (MULTIVITAMIN WITH MINERALS) TABS tablet Take 1 tablet by mouth daily.    . pantoprazole (PROTONIX) 40 MG tablet Take 1 tablet (40 mg total) by mouth 2 (two) times daily. 180 tablet 3  . potassium chloride SA (K-DUR,KLOR-CON) 20 MEQ tablet 20 meq Daily x 4 days and then resume taking PRN when using lasix.    [name] simvastatin (ZOCOR) 80 MG tablet Take 0.5 tablets (40 mg total) by mouth at bedtime. 45 tablet 3  . tiotropium (SPIRIVA) 18 MCG inhalation capsule Place 1 capsule (18 mcg total) into inhaler and inhale daily. 90 capsule 3  . vitamin B-12 (CYANOCOBALAMIN) 1000 MCG tablet Take 1,000 mcg by mouth at bedtime.    . vitamin C (ASCORBIC ACID) 500 MG tablet Take 1 tablet (500 mg total) by mouth 2 (two) times daily. 180 tablet 3  . nitroGLYCERIN (NITROSTAT) 0.4 MG SL tablet Place 0.4 mg under the tongue every 5 (five) minutes as needed for chest pain. Reported on 01/23/2016     No current  facility-administered medications for this visit.    REVIEW OF SYSTEMS:   Constitutional: Denies fevers, chills or abnormal night sweats Eyes: Denies blurriness of vision, double vision or watery eyes Ears, nose, mouth, throat, and face: Denies mucositis or sore throat Respiratory: Denies cough, dyspnea or wheezes Cardiovascular:  Denies palpitation, chest discomfort or lower extremity swelling Gastrointestinal:  Denies nausea, heartburn or change in bowel habits Skin: Denies abnormal skin rashes Lymphatics: Denies new lymphadenopathy or easy bruising Neurological:Denies numbness, tingling or new weaknesses Behavioral/Psych: Mood is stable, no new changes  All other systems were reviewed with the patient and are negative.  PHYSICAL EXAMINATION: ECOG PERFORMANCE STATUS: 1 - Symptomatic but completely ambulatory  Filed Vitals:   03/12/16 1427  BP: 88/63  Pulse: 56  Temp: 97.8 F (36.6 C)  Resp: 18   Filed Weights   03/12/16 1427  Weight: 217 lb 9.6 oz (98.703 kg)    GENERAL:alert, no distress and comfortable SKIN: skin color, texture, turgor are normal, no rashes or significant lesions EYES: normal, conjunctiva are pink and non-injected, sclera clear OROPHARYNX:no exudate, no erythema and lips, buccal mucosa, and tongue normal  NECK: supple, thyroid normal size, non-tender, without nodularity LYMPH:  no palpable lymphadenopathy in the cervical, axillary or inguinal LUNGS: clear to auscultation and percussion with normal breathing effort HEART: (+) LVAD blood flow   ABDOMEN:abdomen soft, non-tender and normal bowel sounds Musculoskeletal:no cyanosis of digits and no clubbing  PSYCH: alert & oriented x 3 with fluent speech NEURO: no focal motor/sensory deficits  LABORATORY DATA:  I have reviewed the data as listed CBC Latest Ref Rng 03/12/2016 03/03/2016 03/02/2016  WBC 4.0 - 10.3 10e3/uL 5.4 6.7 7.0  Hemoglobin 13.0 - 17.1 g/dL 10.1(L) 9.0(L) 8.9(L)  Hematocrit 38.4 - 49.9 %  34.3(L) 31.7(L) 31.1(L)  Platelets 140 - 400 10e3/uL 200 193 164    CMP Latest Ref Rng 03/03/2016 03/02/2016 03/01/2016  Glucose 65 - 99 mg/dL 107(H) 131(H) 124(H)  BUN 6 - 20 mg/dL 13 12 13   Creatinine 0.61 - 1.24 mg/dL 0.94 0.93 0.84  Sodium 135 - 145 mmol/L 141 141 139  Potassium 3.5 - 5.1 mmol/L 3.3(L) 3.5 3.7  Chloride 101 - 111 mmol/L 107 107 112(H)  CO2 22 - 32 mmol/L 28 28 22   Calcium 8.9 - 10.3 mg/dL 9.5 9.2 8.9  Total Protein 6.5 - 8.1 g/dL 5.8(L) 5.3(L) 4.9(L)  Total Bilirubin 0.3 - 1.2 mg/dL 2.1(H) 2.3(H) 2.7(H)  Alkaline Phos 38 - 126 U/L 192(H) 176(H) 200(H)  AST 15 - 41 U/L 39 43(H) 61(H)  ALT 17 - 63 U/L 62 74(H) 91(H)   Results for MOUHAMADOU, PROFFER (MRN [id number]) as of 03/12/2016 07:39  Ref. Range 01/22/2016 08:55 02/05/2016 09:01 02/20/2016 02:31  Iron Latest Ref Range: 45-182 ug/dL 19 (L)  21 (L)  UIBC Latest Units: ug/dL 335  302  TIBC Latest Ref Range: 250-450 ug/dL 354  323  %SAT Latest Ref Range: 20-55 % 5 (L)    Saturation Ratios Latest Ref Range: 17.9-39.5 %   6 (L)  Ferritin Latest Ref Range: 24-336 ng/mL 18 (L) 295 35     RADIOGRAPHIC STUDIES: I have personally reviewed the radiological images as listed and agreed with the findings in the report. Dg Chest 1 View  02/26/2016  CLINICAL DATA:  Pneumonia.  Shortness of breath.  Fall tonight. EXAM: CHEST 1 VIEW COMPARISON:  02/16/2016 FINDINGS: Patient is post median sternotomy. Cardiomegaly is again seen, question mild improvement. Left ventricular assist device again seen. Right infrahilar opacity which may be new from prior. Diaphragmatic calcifications bilaterally. No pulmonary edema. No large pleural effusion. IMPRESSION: 1. Right infrahilar opacity may reflect pneumonia. This is likely new from prior exam. 2. Cardiomegaly, grossly stable or minimally improved. No pulmonary edema. Electronically Signed   By: Threasa Beards  Ehinger M.D.   On: 02/26/2016 21:14   Nm Hepatobiliary Liver Func  02/27/2016  CLINICAL DATA:   Right upper quadrant pain. EXAM: NUCLEAR MEDICINE HEPATOBILIARY IMAGING TECHNIQUE: Sequential images of the abdomen were obtained out to 60 minutes following intravenous administration of radiopharmaceutical. RADIOPHARMACEUTICALS:  5 mCi Tc-62m  Choletec IV COMPARISON:  Sonography from yesterday. FINDINGS: There is adequate tracer uptake into the liver. Photopenic area over the left liver correlates with LVAD. Biliary activity passes into small bowel, consistent with patent common bile duct. Gallbladder was not seen at 2 hours. No hypervascular rim sign. These results will be called to the ordering clinician or representative by the Radiologist Assistant, and communication documented in the PACS or zVision Dashboard. IMPRESSION: 1. Findings of cystic duct obstruction, supportive of acute cholecystitis. 2. Patent common bile duct. Electronically Signed   By: Monte Fantasia M.D.   On: 02/27/2016 12:26   Ir Perc Cholecystostomy  02/29/2016  INDICATION: 75 year old with acute cholecystitis and multiple medical problems, including ischemic cardiomyopathy with a LVAD. Patient is not a surgical candidate. EXAM: PERCUTANEOUS CHOLECYSTOSTOMY TUBE PLACEMENT WITH ULTRASOUND AND FLUOROSCOPIC GUIDANCE MEDICATIONS: None ANESTHESIA/SEDATION: Moderate (conscious) sedation was employed during this procedure. A total of Versed 1.0 mg and Fentanyl 25 mcg was administered intravenously. Moderate Sedation Time: 20 minutes. The patient's level of consciousness and vital signs were monitored continuously by radiology nursing throughout the procedure under my direct supervision. FLUOROSCOPY TIME:  Fluoroscopy Time: 42 seconds, 21 mGy COMPLICATIONS: None immediate. PROCEDURE: The procedure was explained to the patient. The risks and benefits of the procedure were discussed and the patient's questions were addressed. Informed consent was obtained from the patient. The right side of the abdomen was evaluated with ultrasound. The  gallbladder was identified. The right side of the abdomen was prepped and draped in sterile fashion. Maximal barrier sterile technique was utilized including caps, mask, sterile gowns, sterile gloves, sterile drape, hand hygiene and skin antiseptic. Skin was anesthetized with 1% lidocaine. Using ultrasound guidance, a 21 gauge needle was directed into the distended gallbladder via a transhepatic approach. Needle placement confirmed in the gallbladder. Dark bile was identified. A 0.018 wire was placed and Accustick dilator set was placed. The tract was dilated to accommodate a 10.2 [place] multipurpose drain. The drain was reconstituted in the gallbladder and 75 mL of dark bile was removed. The gallbladder was decompressed at the end of the procedure based on ultrasound. Fluid was sent for culture. Catheter was attached to gravity bag. Catheter was sutured to skin. Bandage placed over the catheter. Fluoroscopic and ultrasound images were taken and saved for documentation. FINDINGS: Distended gallbladder. Successful placement of a percutaneous cholecystostomy tube via a transhepatic approach. 75 mL of dark bile was removed. IMPRESSION: Successful percutaneous cholecystostomy tube placement with ultrasound and fluoroscopic guidance. Bile was sent for culture. Electronically Signed   By: Markus Daft M.D.   On: 02/29/2016 11:11   Dg Chest Portable 1 View  02/16/2016  CLINICAL DATA:  Weakness today no chest pain. EXAM: PORTABLE CHEST 1 VIEW COMPARISON:  4/3 / 17 FINDINGS: Spine wires overlie stable enlarged cardiac silhouette. LVAD device noted. No pulmonary edema. No pneumothorax. No pleural fluid identified. IMPRESSION: No pulmonary edema.  Stable cardiomegaly. Electronically Signed   By: Suzy Bouchard M.D.   On: 02/16/2016 13:55   US Abdomen Limited Ruq  02/26/2016  CLINICAL DATA:  Elevated transaminase level. EXAM: US ABDOMEN LIMITED - RIGHT UPPER QUADRANT COMPARISON:  CT 03/09/2014 FINDINGS: Gallbladder:  Distended. Gallstones are  noted with a 1.1 cm stone in the gallbladder neck. Mild intraluminal sludge. Gallbladder wall thickness measures 3-6 mm. Small amount of pericholecystic fluid. No sonographic Gowell sign noted by sonographer. Common bile duct: Diameter: 3 mm Liver: No focal lesion identified. Diffusely increased in parenchymal echogenicity. Nodular capsular contours. Normal directional flow in the main portal vein. IMPRESSION: 1. Cirrhotic hepatic morphology with nodular contours and increased echogenicity, similar to prior CT. No focal lesion is seen sonographically. 2. Cholelithiasis. Mild gallbladder distention or wall thickening. Small amount of pericholecystic fluid. Wall thickening and pericholecystic fluid are common findings in chronic liver disease, however acute cholecystitis can have this appearance. Nuclear medicine HIDA scan may be of value based on clinical concern. 3. No biliary dilatation. Electronically Signed   By: Jeb Levering M.D.   On: 02/26/2016 21:12    ASSESSMENT & PLAN:  75 year old Caucasian male, with extensive cardiac history, including CAD, ischemic cardiomyopathy status post LVAD, on long-term Coumadin, presented with multiple episodes of GI bleeding and anemia.  1. 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 -His iron studies showed low ferritin level and serum iron, consistent with iron deficient anemia. This is secondary to his GI bleeding. -He responded to IV iron very well. 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  -His Coumadin has been changed to Eliquis now  -He had significant GI bleeding again from AVM last month, will give IV Feraheme today and next week -Close monitoring his CBC and ferritin every 2 weeks and iron level every months in our cancer center  -Continue Feraheme  if ferritin less than 100, I'll set up infusion appointment if needed -He will also receive red blood cell if his hemoglobin less than 9 in our Center -His cardiologist recommended him to start Sandostatin for AVM, which was approved by the [place], we'll start injection today.  2. Mild thrombocytopenia -Secondary to LVAD -Plt count has been stable   3. Acute cholecystitis -Status post percutaneous bile drainage -He will follow up with IR and surgeon if needed.  3. He will continue to follow-up with his primary care physician and cardiologist for his HTN, heart disease, hypothyroidism, OSA and mild liver cirrhosis..  Plan -Repeat CBC and ferritin every 2 weeks and iron study monthly  Here -IV Feraheme  510mg  X1 today and next week -2u RBC if Hb<9.0  -We'll start Sandostatin injection today and continue every 4 weeks -I'll see him back in 4 weeks  All questions were answered. The patient knows to call the clinic with any problems, questions or concerns.  I spent 20 minutes counseling the patient face to face. The total time spent in the appointment was 25 minutes and more than 50% was on counseling.     Truitt Merle, MD 03/12/2016

## 2016-03-12 NOTE — Telephone Encounter (Signed)
per pof to sch pt appt-sent MW email to sch fera-sent DrFeng email to see if we need to keep 6/26 appt lab/fera-will call pt after both replies

## 2016-03-14 ENCOUNTER — Telehealth: Payer: Self-pay | Admitting: *Deleted

## 2016-03-14 NOTE — Telephone Encounter (Signed)
Per staff message and POF I have scheduled appts. Advised scheduler of appts. JMW  

## 2016-03-15 ENCOUNTER — Telehealth: Payer: Self-pay | Admitting: Hematology

## 2016-03-15 NOTE — Telephone Encounter (Signed)
cld & spoke to wife faye and adv of 6/23 appt and adv to get updated copy of avs @ 6/23 appt

## 2016-03-22 ENCOUNTER — Ambulatory Visit (HOSPITAL_BASED_OUTPATIENT_CLINIC_OR_DEPARTMENT_OTHER): Payer: No Typology Code available for payment source

## 2016-03-22 VITALS — BP 92/77 | HR 64 | Temp 98.0°F | Resp 18

## 2016-03-22 DIAGNOSIS — D500 Iron deficiency anemia secondary to blood loss (chronic): Secondary | ICD-10-CM

## 2016-03-22 DIAGNOSIS — K922 Gastrointestinal hemorrhage, unspecified: Secondary | ICD-10-CM

## 2016-03-22 DIAGNOSIS — D62 Acute posthemorrhagic anemia: Secondary | ICD-10-CM

## 2016-03-22 MED ORDER — SODIUM CHLORIDE 0.9 % IV SOLN
510.0000 mg | Freq: Once | INTRAVENOUS | Status: AC
  Administered 2016-03-22: 510 mg via INTRAVENOUS
  Filled 2016-03-22: qty 17

## 2016-03-22 NOTE — Patient Instructions (Signed)

## 2016-03-25 ENCOUNTER — Other Ambulatory Visit: Payer: Medicare Other

## 2016-03-25 ENCOUNTER — Ambulatory Visit: Payer: No Typology Code available for payment source

## 2016-03-25 ENCOUNTER — Ambulatory Visit: Payer: No Typology Code available for payment source | Admitting: Hematology

## 2016-03-25 ENCOUNTER — Other Ambulatory Visit: Payer: No Typology Code available for payment source

## 2016-03-26 ENCOUNTER — Encounter (HOSPITAL_COMMUNITY): Payer: Self-pay | Admitting: *Deleted

## 2016-03-26 ENCOUNTER — Other Ambulatory Visit (HOSPITAL_BASED_OUTPATIENT_CLINIC_OR_DEPARTMENT_OTHER): Payer: No Typology Code available for payment source

## 2016-03-26 ENCOUNTER — Emergency Department (HOSPITAL_COMMUNITY)
Admission: EM | Admit: 2016-03-26 | Discharge: 2016-03-26 | Disposition: A | Discharge status: Home / Self Care | Payer: Medicare Other | Attending: Emergency Medicine | Admitting: Emergency Medicine

## 2016-03-26 ENCOUNTER — Emergency Department (HOSPITAL_COMMUNITY): Payer: Medicare Other

## 2016-03-26 DIAGNOSIS — W19XXXA Unspecified fall, initial encounter: Secondary | ICD-10-CM

## 2016-03-26 DIAGNOSIS — Y999 Unspecified external cause status: Secondary | ICD-10-CM | POA: Diagnosis not present

## 2016-03-26 DIAGNOSIS — Z87891 Personal history of nicotine dependence: Secondary | ICD-10-CM | POA: Diagnosis not present

## 2016-03-26 DIAGNOSIS — Y92009 Unspecified place in unspecified non-institutional (private) residence as the place of occurrence of the external cause: Secondary | ICD-10-CM | POA: Insufficient documentation

## 2016-03-26 DIAGNOSIS — I2510 Atherosclerotic heart disease of native coronary artery without angina pectoris: Secondary | ICD-10-CM | POA: Diagnosis not present

## 2016-03-26 DIAGNOSIS — W1839XA Other fall on same level, initial encounter: Secondary | ICD-10-CM | POA: Diagnosis not present

## 2016-03-26 DIAGNOSIS — D649 Anemia, unspecified: Secondary | ICD-10-CM

## 2016-03-26 DIAGNOSIS — Z859 Personal history of malignant neoplasm, unspecified: Secondary | ICD-10-CM | POA: Diagnosis not present

## 2016-03-26 DIAGNOSIS — J449 Chronic obstructive pulmonary disease, unspecified: Secondary | ICD-10-CM | POA: Diagnosis not present

## 2016-03-26 DIAGNOSIS — Z7901 Long term (current) use of anticoagulants: Secondary | ICD-10-CM | POA: Diagnosis not present

## 2016-03-26 DIAGNOSIS — Z95810 Presence of automatic (implantable) cardiac defibrillator: Secondary | ICD-10-CM | POA: Diagnosis not present

## 2016-03-26 DIAGNOSIS — Z951 Presence of aortocoronary bypass graft: Secondary | ICD-10-CM | POA: Diagnosis not present

## 2016-03-26 DIAGNOSIS — S0990XA Unspecified injury of head, initial encounter: Secondary | ICD-10-CM

## 2016-03-26 DIAGNOSIS — I5022 Chronic systolic (congestive) heart failure: Secondary | ICD-10-CM | POA: Insufficient documentation

## 2016-03-26 DIAGNOSIS — Y9301 Activity, walking, marching and hiking: Secondary | ICD-10-CM | POA: Diagnosis not present

## 2016-03-26 DIAGNOSIS — Q2733 Arteriovenous malformation of digestive system vessel: Secondary | ICD-10-CM | POA: Diagnosis not present

## 2016-03-26 DIAGNOSIS — D500 Iron deficiency anemia secondary to blood loss (chronic): Secondary | ICD-10-CM | POA: Diagnosis not present

## 2016-03-26 DIAGNOSIS — I110 Hypertensive heart disease with heart failure: Secondary | ICD-10-CM | POA: Insufficient documentation

## 2016-03-26 DIAGNOSIS — I252 Old myocardial infarction: Secondary | ICD-10-CM | POA: Insufficient documentation

## 2016-03-26 DIAGNOSIS — S01311A Laceration without foreign body of right ear, initial encounter: Secondary | ICD-10-CM | POA: Insufficient documentation

## 2016-03-26 DIAGNOSIS — S0991XA Unspecified injury of ear, initial encounter: Secondary | ICD-10-CM | POA: Diagnosis present

## 2016-03-26 LAB — PROTIME-INR
INR: 2.32 — AB (ref 0.00–1.49)
Prothrombin Time: 25.2 seconds — ABNORMAL HIGH (ref 11.6–15.2)

## 2016-03-26 LAB — CBC & DIFF AND RETIC
BASO%: 0.2 % (ref 0.0–2.0)
Basophils Absolute: 0 10*3/uL (ref 0.0–0.1)
EOS%: 2.5 % (ref 0.0–7.0)
Eosinophils Absolute: 0.1 10*3/uL (ref 0.0–0.5)
HCT: 35.1 % — ABNORMAL LOW (ref 38.4–49.9)
HGB: 10.8 g/dL — ABNORMAL LOW (ref 13.0–17.1)
Immature Retic Fract: 16.3 % — ABNORMAL HIGH (ref 3.00–10.60)
LYMPH#: 0.5 10*3/uL — AB (ref 0.9–3.3)
LYMPH%: 10 % — ABNORMAL LOW (ref 14.0–49.0)
MCH: 28.1 pg (ref 27.2–33.4)
MCHC: 30.7 g/dL — AB (ref 32.0–36.0)
MCV: 91.7 fL (ref 79.3–98.0)
MONO#: 0.4 10*3/uL (ref 0.1–0.9)
MONO%: 7.8 % (ref 0.0–14.0)
NEUT#: 3.9 10*3/uL (ref 1.5–6.5)
NEUT%: 79.5 % — AB (ref 39.0–75.0)
PLATELETS: 104 10*3/uL — AB (ref 140–400)
RBC: 3.83 10*6/uL — AB (ref 4.20–5.82)
RDW: 23.2 % — ABNORMAL HIGH (ref 11.0–14.6)
RETIC CT ABS: 123.33 10*3/uL — AB (ref 34.80–93.90)
Retic %: 3.22 % — ABNORMAL HIGH (ref 0.80–1.80)
WBC: 4.8 10*3/uL (ref 4.0–10.3)

## 2016-03-26 LAB — FERRITIN: Ferritin: 479 ng/ml — ABNORMAL HIGH (ref 22–316)

## 2016-03-26 NOTE — Discharge Instructions (Signed)
Continue taking your home medications as prescribed. I recommend keeping a gauze bandage over your right ear especially while wearing your CPAP machine at night. Follow-up with your primary care provider within the next 2-3 days for wound recheck. Return to the emergency department if symptoms worsen or new onset of fever, headache, uncontrollable bleeding, lightheadedness, dizziness, syncope.

## 2016-03-26 NOTE — ED Provider Notes (Signed)
CSN: [id number]     Arrival date & time 03/26/16  2001 History   First MD Initiated Contact with Patient 03/26/16 2017     Chief Complaint  Patient presents with  . Fall     (Consider location/radiation/quality/duration/timing/severity/associated sxs/prior Treatment) HPI   Patient is a 75 year old male with past medical history of CHF, hyperlipidemia, A. fib, LVAD (on Eliquis), CAD, hypertension who presents the ED status post mechanical fall, onset prior to arrival. Patient reports he was walking at home and his feet got "tangled up" resulting in him losing his balance and falling backwards. He reports hitting his right ear on the corner of a nightstand. Denies LOC. Patient denies any pain but reports having continuous oozing from his right ear. He reports having a chronic "blood blister" to his right pinna and notes he has a small cut that has continued oozing blood after apply pressure. Denies fever, chills, headache, lightheaded, dizziness, visual changes, neck pain, back pain, chest pain, shortness of breath, numbness, tingling, weakness, syncope, seizure. Patient reports he has been taking his home medications as prescribed including Eliquis. Tetanus UTD.  Past Medical History  Diagnosis Date  . Ischemic cardiomyopathy      CABG 2003, PCI 2007  EF 27%(myoview 2012  . Chronic systolic heart failure ([place])   . Hyperlipidemia   . Hypothyroidism   . Chronic anticoagulation     Afib and LVAD  . Obesity   . COPD (chronic obstructive pulmonary disease) ([place])   . Asbestosis([place])     "6 years in the [place]" (05/24/2013)  . Atrial fibrillation ([place])     permanent  . Paroxysmal ventricular tachycardia ([place])   . Coronary artery disease   . Hypertension   . Depression   . LVAD (left ventricular assist device) present ([place]) 12/2012  . Epistaxis 11/2015, 07/2014  . Cancer ([place])     "scraped some off behind my left ear; fast moving; having it cut out 12/05/2015" (11/07/2015)  . CHF (congestive heart  failure) ([place])   . Myocardial infarction ([place]) 1990's-2000    "2 in  ~ the 1990's; 1 in ~ 2000" (11/07/2015)  . On home oxygen therapy     "have it available; don't use it" (11/07/2015)  . OSA on CPAP   . Anemia   . History of blood transfusion 08/2015 X 10; 11/2015 X 2    "related to bleeding on the inside somewhere" (11/07/2015)  . Complication of anesthesia     "they can't put me all the way to sleep cause of my heart" (11/07/2015)   Past Surgical History  Procedure Laterality Date  . Coronary artery bypass graft  2003    "?X4" (05/24/2013)  . Cardiac defibrillator placement  2004; ~ 2010    "cut it out in 2016 after it got infected"   . Insertion of implantable left ventricular assist device N/A 01/12/2013    Procedure: INSERTION OF IMPLANTABLE LEFT VENTRICULAR ASSIST DEVICE;  Surgeon: Ivin Poot, MD;  Location: [facility];  Service: Open Heart Surgery;  Laterality: N/A;   nitric oxide; Redo sternotomy  . Intraoperative transesophageal echocardiogram N/A 01/12/2013    Procedure: INTRAOPERATIVE TRANSESOPHAGEAL ECHOCARDIOGRAM;  Surgeon: Ivin Poot, MD;  Location: [facility];  Service: Open Heart Surgery;  Laterality: N/A;  . Colonoscopy N/A 03/09/2014    Procedure: COLONOSCOPY;  Surgeon: Inda Castle, MD;  Location: [facility];  Service: Endoscopy;  Laterality: N/A;  LVAD  patient  . Esophagogastroduodenoscopy N/A 03/09/2014    Procedure:  ESOPHAGOGASTRODUODENOSCOPY (EGD);  Surgeon: Inda Castle, MD;  Location: [facility];  Service: Endoscopy;  Laterality: N/A;  . Givens capsule study N/A 03/10/2014    Procedure: GIVENS CAPSULE STUDY;  Surgeon: Inda Castle, MD;  Location: [facility];  Service: Endoscopy;  Laterality: N/A;  . Enteroscopy N/A 07/27/2014    Procedure: ENTEROSCOPY;  Surgeon: Gatha Mayer, MD;  Location: [facility];  Service: Endoscopy;  Laterality: N/A;  LVAD patient   . Colonoscopy N/A 07/29/2014    Procedure: COLONOSCOPY;  Surgeon: Gatha Mayer, MD;  Location:  [facility];  Service: Endoscopy;  Laterality: N/A;  . Givens capsule study N/A 07/30/2014    Procedure: GIVENS CAPSULE STUDY;  Surgeon: Gatha Mayer, MD;  Location: [facility];  Service: Endoscopy;  Laterality: N/A;  . Esophagogastroduodenoscopy N/A 08/01/2014    Procedure: ESOPHAGOGASTRODUODENOSCOPY (EGD);  Surgeon: Jerene Bears, MD;  Location: [facility] ENDOSCOPY;  Service: Endoscopy;  Laterality: N/A;  LVAD patient  . Left and right heart catheterization with coronary/graft angiogram  01/07/2013    Procedure: LEFT AND RIGHT HEART CATHETERIZATION WITH Beatrix Fetters;  Surgeon: Jolaine Artist, MD;  Location: [facility] CATH LAB;  Service: Cardiovascular;;  . Enteroscopy N/A 09/27/2014    Procedure: ENTEROSCOPY;  Surgeon: Gatha Mayer, MD;  Location: [facility] ENDOSCOPY;  Service: Endoscopy;  Laterality: N/A;  . Icd lead removal Left 03/16/2015    Procedure: ICD LEAD REMOVAL/EXTRACTION;  Surgeon: Evans Lance, MD;  Location: [facility];  Service: Cardiovascular;  Laterality: Left;  PATIENT HAS LVAD  DR. VAN TRIGT TO BACK UP EXTRACTION  . Appendectomy    . Enteroscopy N/A 11/09/2015    Procedure: ENTEROSCOPY;  Surgeon: Mauri Pole, MD;  Location: [facility] ENDOSCOPY;  Service: Endoscopy;  Laterality: N/A;  LVAD  . Givens capsule study N/A 12/11/2015    Procedure: GIVENS CAPSULE STUDY;  Surgeon: Gatha Mayer, MD;  Location: [facility];  Service: Endoscopy;  Laterality: N/A;  . Enteroscopy N/A 12/13/2015    Procedure: ENTEROSCOPY;  Surgeon: Milus Banister, MD;  Location: [place];  Service: Endoscopy;  Laterality: N/A;  . Enteroscopy N/A 02/21/2016    Procedure: ENTEROSCOPY;  Surgeon: Milus Banister, MD;  Location: [place];  Service: Endoscopy;  Laterality: N/A;   Family History  Problem Relation Age of Onset  . Heart attack Mother   . Heart attack Father    Social History  Substance Use Topics  . Smoking status: Former Smoker -- 2.00 packs/day for 45 years    Types: Cigarettes     Quit date: 11/11/2001  . Smokeless tobacco: Never Used  . Alcohol Use: Yes     Comment: 05/24/2013 "use to drink beer; hardly nothing since 2003; nothing at all since 12/2011 when I got my LVAD  "    Review of Systems  Skin: Positive for wound (head injury).  All other systems reviewed and are negative.     Allergies  Tape  Home Medications   Prior to Admission medications   Medication Sig Start Date End Date Taking? Authorizing Provider  albuterol (PROVENTIL) (2.5 MG/3ML) 0.083% nebulizer solution Take 2.5 mg by nebulization every 6 (six) hours as needed for wheezing or shortness of breath.   Yes Historical Provider, MD  amiodarone (PACERONE) 200 MG tablet Take 1 tablet (200 mg total) by mouth daily. Patient taking differently: Take 200 mg by mouth 2 (two) times daily.  03/03/16  Yes Rogelia Mire, NP  apixaban (ELIQUIS) 5 MG TABS tablet Take 1  tablet (5 mg total) by mouth 2 (two) times daily. 12/25/15  Yes Jolaine Artist, MD  budesonide-formoterol [facility]) 160-4.5 MCG/ACT inhaler Inhale 2 puffs into the lungs 2 (two) times daily. 01/10/15  Yes Jolaine Artist, MD  cholecalciferol (VITAMIN D) 1000 UNITS tablet Take 1 tablet (1,000 Units total) by mouth 2 (two) times daily. 01/10/15  Yes Jolaine Artist, MD  citalopram (CELEXA) 40 MG tablet Take 1 tablet (40 mg total) by mouth at bedtime. 01/10/15  Yes Jolaine Artist, MD  docusate sodium (COLACE) 100 MG capsule Take 100 mg by mouth 2 (two) times daily.    Yes Historical Provider, MD  hydrocortisone (PROCTOSOL HC) 2.5 % rectal cream Place 1 application rectally See admin instructions. Apply daily as needed for hemorrhoids   Yes Historical Provider, MD  levothyroxine (SYNTHROID, LEVOTHROID) 100 MCG tablet Take 1 tablet (100 mcg total) by mouth at bedtime. 02/23/16  Yes Amy D Clegg, NP  losartan (COZAAR) 25 MG tablet Take 25 mg by mouth at bedtime.   Yes Historical Provider, MD  Multiple Vitamin (MULTIVITAMIN WITH MINERALS)  TABS tablet Take 1 tablet by mouth daily.   Yes Historical Provider, MD  nitroGLYCERIN (NITROSTAT) 0.4 MG SL tablet Place 0.4 mg under the tongue every 5 (five) minutes as needed for chest pain. Reported on 01/23/2016   Yes Historical Provider, MD  pantoprazole (PROTONIX) 40 MG tablet Take 1 tablet (40 mg total) by mouth 2 (two) times daily. 01/10/15  Yes Jolaine Artist, MD  PRESCRIPTION MEDICATION Ferumoxytol [facility]) 510 mg in sodium chloride 0.9 % 100 mL IVPB (Once) Route: Intravenous   Yes Historical Provider, MD  PRESCRIPTION MEDICATION Octreotide (SANDOSTATIN LAR) IM injection 20 mg (Once)Route: Intramuscular   Yes Historical Provider, MD  simvastatin (ZOCOR) 80 MG tablet Take 0.5 tablets (40 mg total) by mouth at bedtime. 01/10/15  Yes Jolaine Artist, MD  tiotropium (SPIRIVA) 18 MCG inhalation capsule Place 1 capsule (18 mcg total) into inhaler and inhale daily. 01/10/15  Yes Jolaine Artist, MD  vitamin B-12 (CYANOCOBALAMIN) 1000 MCG tablet Take 1,000 mcg by mouth at bedtime.   Yes Historical Provider, MD  vitamin C (ASCORBIC ACID) 500 MG tablet Take 1 tablet (500 mg total) by mouth 2 (two) times daily. 01/10/15  Yes Jolaine Artist, MD  furosemide (LASIX) 40 MG tablet 40 mg daily x 4 days and then resume taking as needed. Patient taking differently: Take 40 mg by mouth daily as needed for fluid. Use only if a weight gain of 5 pounds in a week occurs or evident swelling is present 03/03/16   Rogelia Mire, NP  hydrocortisone (ANUSOL-HC) 2.5 % rectal cream Place 1 application rectally 2 (two) times daily. Patient not taking: Reported on 03/26/2016 11/21/15   Larey Dresser, MD  potassium chloride SA (K-DUR,KLOR-CON) 20 MEQ tablet 20 meq Daily x 4 days and then resume taking PRN when using lasix. Patient taking differently: Take 40 mEq by mouth daily as needed. ONLY TAKEN WHEN  LASIX IS BEING USED 03/03/16   Rogelia Mire, NP   BP 110/85 mmHg  Pulse 68  Temp(Src) 97.7 F  (36.5 C) (Oral)  Resp 18  Ht 6\' 3"  (1.905 m)  Wt 97.977 kg  BMI 27.00 kg/m2  SpO2 99% Physical Exam  Constitutional: He is oriented to person, place, and time. He appears well-developed and well-nourished.  HENT:  Head: Normocephalic and atraumatic. Head is without raccoon's eyes, without Battle's sign and without laceration.  Right Ear: Tympanic membrane normal. No hemotympanum.  Left Ear: Tympanic membrane normal. No hemotympanum.  Nose: Nose normal. No nose lacerations, sinus tenderness, nasal deformity, septal deviation or nasal septal hematoma. No epistaxis.  Mouth/Throat: Uvula is midline, oropharynx is clear and moist and mucous membranes are normal. No oropharyngeal exudate, posterior oropharyngeal edema, posterior oropharyngeal erythema or tonsillar abscesses.  0.5cm blood bulla noted to right pinna with tiny laceration noted on bulla, no active bleeding.  Eyes: Conjunctivae and EOM are normal. Right eye exhibits no discharge. Left eye exhibits no discharge. No scleral icterus.  Neck: Normal range of motion. Neck supple.  Cardiovascular: Intact distal pulses.   Mechanical heart sounds with LVAD hum present  Pulmonary/Chest: Effort normal and breath sounds normal. No respiratory distress. He has no wheezes. He has no rales. He exhibits no tenderness.  Abdominal: Soft. Bowel sounds are normal. There is no tenderness.  Musculoskeletal: Normal range of motion. He exhibits no edema or tenderness.  No midline C, T, or L tenderness. Full range of motion of neck and back. Full range of motion of bilateral upper and lower extremities, with 5/5 strength. Sensation intact. 2+ radial and PT pulses. Cap refill <2 seconds. Patient able to stand and ambulate without assistance.    Neurological: He is alert and oriented to person, place, and time. He has normal strength. No cranial nerve deficit or sensory deficit. He displays a negative Romberg sign. Coordination and gait normal.  Normal  finger-nose-finger  Skin: Skin is warm and dry.  Nursing note and vitals reviewed.   ED Course  Procedures (including critical care time) Labs Review Labs Reviewed  PROTIME-INR - Abnormal; Notable for the following:    Prothrombin Time 25.2 (*)    INR 2.32 (*)    All other components within normal limits    Imaging Review Ct Head Wo Contrast  03/26/2016  CLINICAL DATA:  75 year old male with head injury EXAM: CT HEAD WITHOUT CONTRAST TECHNIQUE: Contiguous axial images were obtained from the base of the skull through the vertex without intravenous contrast. COMPARISON:  Head CT dated 05/24/2013 FINDINGS: The ventricles and sulci are appropriate size for patient's age. Mild periventricular and deep white matter chronic microvascular ischemic changes noted. There is no acute intracranial hemorrhage. No mass effect or midline shift noted. There is complete opacification of the visualized right maxillary sinus with remodeling and medial bowling of the medial wall similar to prior study stop and suggestive of chronic sinusitis. Remainder of the visualized paranasal sinuses and mastoid air cells are clear. The calvarium is intact. IMPRESSION: No acute intracranial hemorrhage. Mild age-related atrophy and chronic microvascular ischemic disease. Electronically Signed   By: Anner Crete M.D.   On: 03/26/2016 21:03   I have personally reviewed and evaluated these images and lab results as part of my medical decision-making.   EKG Interpretation None      MDM   Final diagnoses:  Fall, initial encounter  Head injury, initial encounter    Patient presents status post mechanical fall with reported head injury, denies LOC. Patient is currently on Eliquis for A. Fib. Pt reports uncontrolled oozing of small cut on right ear where he has a chronic blood blised. Tetanus up-to-date. VSS. Exam revealed small blood filled bulla to right pinna with tiny laceration, No active bleeding. No other signs of  head injury. No neuro deficits. CT head with no acute intracranial hemorrhage. INR 2.3. Discussed results and plan for discharge with patient. Discussed wound care. Advised patient to  follow up with his PCP in the next 2-3 days for wound recheck. Discussed return precautions with patient.    Chesley Noon [place], [place] 03/27/16 0100  Forde Dandy, MD 03/27/16 1414

## 2016-03-26 NOTE — ED Notes (Signed)
Pt states his feet got tangled up and he fell backwards, hitting a previous blood blister on his right ear. Pt states he is on eliquis and still having bleeding on the ear.

## 2016-03-28 ENCOUNTER — Encounter (HOSPITAL_COMMUNITY): Payer: Medicare Other

## 2016-03-29 ENCOUNTER — Other Ambulatory Visit (HOSPITAL_COMMUNITY): Payer: Self-pay | Admitting: Infectious Diseases

## 2016-03-29 ENCOUNTER — Ambulatory Visit (HOSPITAL_COMMUNITY)
Admission: RE | Admit: 2016-03-29 | Discharge: 2016-03-29 | Disposition: A | Discharge status: Home / Self Care | Payer: No Typology Code available for payment source | Source: Ambulatory Visit | Attending: Cardiology | Admitting: Cardiology

## 2016-03-29 ENCOUNTER — Other Ambulatory Visit: Payer: Self-pay

## 2016-03-29 ENCOUNTER — Telehealth (HOSPITAL_COMMUNITY): Payer: Self-pay | Admitting: Infectious Diseases

## 2016-03-29 DIAGNOSIS — Z87891 Personal history of nicotine dependence: Secondary | ICD-10-CM | POA: Diagnosis not present

## 2016-03-29 DIAGNOSIS — IMO0001 Reserved for inherently not codable concepts without codable children: Secondary | ICD-10-CM

## 2016-03-29 DIAGNOSIS — Z7901 Long term (current) use of anticoagulants: Secondary | ICD-10-CM

## 2016-03-29 DIAGNOSIS — Z9981 Dependence on supplemental oxygen: Secondary | ICD-10-CM | POA: Insufficient documentation

## 2016-03-29 DIAGNOSIS — I110 Hypertensive heart disease with heart failure: Secondary | ICD-10-CM | POA: Diagnosis not present

## 2016-03-29 DIAGNOSIS — I5022 Chronic systolic (congestive) heart failure: Secondary | ICD-10-CM

## 2016-03-29 DIAGNOSIS — I255 Ischemic cardiomyopathy: Secondary | ICD-10-CM | POA: Insufficient documentation

## 2016-03-29 DIAGNOSIS — E876 Hypokalemia: Secondary | ICD-10-CM

## 2016-03-29 DIAGNOSIS — G4733 Obstructive sleep apnea (adult) (pediatric): Secondary | ICD-10-CM | POA: Insufficient documentation

## 2016-03-29 DIAGNOSIS — E039 Hypothyroidism, unspecified: Secondary | ICD-10-CM | POA: Diagnosis not present

## 2016-03-29 DIAGNOSIS — J449 Chronic obstructive pulmonary disease, unspecified: Secondary | ICD-10-CM | POA: Diagnosis not present

## 2016-03-29 DIAGNOSIS — R42 Dizziness and giddiness: Secondary | ICD-10-CM | POA: Insufficient documentation

## 2016-03-29 DIAGNOSIS — I484 Atypical atrial flutter: Secondary | ICD-10-CM | POA: Diagnosis not present

## 2016-03-29 DIAGNOSIS — K810 Acute cholecystitis: Secondary | ICD-10-CM | POA: Diagnosis not present

## 2016-03-29 DIAGNOSIS — I480 Paroxysmal atrial fibrillation: Secondary | ICD-10-CM | POA: Diagnosis not present

## 2016-03-29 DIAGNOSIS — D649 Anemia, unspecified: Secondary | ICD-10-CM | POA: Insufficient documentation

## 2016-03-29 DIAGNOSIS — Z95811 Presence of heart assist device: Secondary | ICD-10-CM | POA: Diagnosis not present

## 2016-03-29 DIAGNOSIS — K7689 Other specified diseases of liver: Secondary | ICD-10-CM

## 2016-03-29 DIAGNOSIS — Z951 Presence of aortocoronary bypass graft: Secondary | ICD-10-CM | POA: Insufficient documentation

## 2016-03-29 DIAGNOSIS — E785 Hyperlipidemia, unspecified: Secondary | ICD-10-CM | POA: Insufficient documentation

## 2016-03-29 DIAGNOSIS — I4891 Unspecified atrial fibrillation: Secondary | ICD-10-CM | POA: Diagnosis not present

## 2016-03-29 DIAGNOSIS — Z7951 Long term (current) use of inhaled steroids: Secondary | ICD-10-CM | POA: Diagnosis not present

## 2016-03-29 DIAGNOSIS — F329 Major depressive disorder, single episode, unspecified: Secondary | ICD-10-CM | POA: Diagnosis not present

## 2016-03-29 DIAGNOSIS — Z79899 Other long term (current) drug therapy: Secondary | ICD-10-CM | POA: Insufficient documentation

## 2016-03-29 DIAGNOSIS — K769 Liver disease, unspecified: Secondary | ICD-10-CM

## 2016-03-29 DIAGNOSIS — I252 Old myocardial infarction: Secondary | ICD-10-CM | POA: Insufficient documentation

## 2016-03-29 DIAGNOSIS — I5020 Unspecified systolic (congestive) heart failure: Secondary | ICD-10-CM

## 2016-03-29 DIAGNOSIS — I2510 Atherosclerotic heart disease of native coronary artery without angina pectoris: Secondary | ICD-10-CM | POA: Diagnosis not present

## 2016-03-29 DIAGNOSIS — R0902 Hypoxemia: Secondary | ICD-10-CM | POA: Diagnosis not present

## 2016-03-29 DIAGNOSIS — I10 Essential (primary) hypertension: Secondary | ICD-10-CM

## 2016-03-29 DIAGNOSIS — K8020 Calculus of gallbladder without cholecystitis without obstruction: Secondary | ICD-10-CM

## 2016-03-29 DIAGNOSIS — W19XXXA Unspecified fall, initial encounter: Secondary | ICD-10-CM

## 2016-03-29 LAB — COMPREHENSIVE METABOLIC PANEL
ALBUMIN: 3 g/dL — AB (ref 3.5–5.0)
ALK PHOS: 76 U/L (ref 38–126)
ALT: 26 U/L (ref 17–63)
AST: 42 U/L — AB (ref 15–41)
Anion gap: 8 (ref 5–15)
BUN: 14 mg/dL (ref 6–20)
CALCIUM: 9.1 mg/dL (ref 8.9–10.3)
CO2: 24 mmol/L (ref 22–32)
CREATININE: 1.12 mg/dL (ref 0.61–1.24)
Chloride: 109 mmol/L (ref 101–111)
GFR calc Af Amer: >60 mL/min (ref >60)
GFR calc non Af Amer: >60 mL/min (ref >60)
GLUCOSE: 124 mg/dL — AB (ref 65–99)
Potassium: 3.3 mmol/L — ABNORMAL LOW (ref 3.5–5.1)
SODIUM: 141 mmol/L (ref 135–145)
Total Bilirubin: 1 mg/dL (ref 0.3–1.2)
Total Protein: 5.8 g/dL — ABNORMAL LOW (ref 6.5–8.1)

## 2016-03-29 LAB — LACTATE DEHYDROGENASE: LDH: 280 U/L — ABNORMAL HIGH (ref 98–192)

## 2016-03-29 MED ORDER — URSODIOL 300 MG PO CAPS: 300.0000 mg | ORAL_CAPSULE | Freq: Two times a day (BID) | ORAL | Status: DC

## 2016-03-29 MED ORDER — POTASSIUM CHLORIDE CRYS ER 20 MEQ PO TBCR: EXTENDED_RELEASE_TABLET | ORAL | Status: DC

## 2016-03-29 MED ORDER — AMIODARONE HCL 200 MG PO TABS: 200.0000 mg | ORAL_TABLET | Freq: Every evening | ORAL | Status: AC

## 2016-03-29 MED ORDER — FUROSEMIDE 40 MG PO TABS: ORAL_TABLET | ORAL | Status: AC

## 2016-03-29 MED ORDER — POTASSIUM CHLORIDE CRYS ER 20 MEQ PO TBCR: 20.0000 meq | EXTENDED_RELEASE_TABLET | Freq: Every day | ORAL | Status: DC

## 2016-03-29 NOTE — Patient Instructions (Addendum)
Start the Ursodiol capsules--1 capsule twice a day. This is to help you dissolve your gallstones  Decrease your Amiodarone to 200mg  (1 pill) every evening.   We will refer you over to the [facility] office for the cardiac event monitor. EKG today with atrial flutter

## 2016-03-29 NOTE — Progress Notes (Addendum)
Symptom  Yes  No  Details   Angina         x Activity:   Claudication                x How far:  Left leg "giving out"  Syncope         x When: dizzy with orthostatic changes  Stroke         x   Orthopnea         x How many pillows:   2 for comfort  PND         x How often:  CPAP         x  How many hrs:  All night; no O2  Pedal edema         x   Abd fullness         x   N&V         x Appetite decreased, but can eat a full meal   Diaphoresis                x When:   Bleeding               x Dark tarry stool - Tuesday  Urine color    medium yellow  SOB        x  Activity:  Incline; one block level ground; fatigue; legs "feel like rubber"  Palpitations         x When:  ICD shock         N/A   Hospitlizaitons        x        When/where/why:  3/6 - 12/14/15  ED visit               x When/where/why:   Other MD              x When/who/why:    Activity    No formal activity; sedentary  Fluid    No limitations  Diet    No limitations   Vital signs: HR: 60s Doppler modified systolic:92 Auto cuff:  710/62 (83) O2 Sat: 90 - 95% Wt:  212.8 lbs  Last weight:  218 lb D/C weight Ht: 6'1"  LVAD interrogation reveals (reviewed with Dr Aundra Dubin):  Speed: 9200 Flow:  4.9 Power: 5.1w PI:  6.2 Alarms: none Events: rare PI event       Fixed speed:  9200 Low speed limit: 8600 Primary Controller:  Replace back up battery in 27 months  Back up controller:   Replace back up battery in 27 months   LVAD exit site:  Well healed and incorporated. The velour is fully implanted at exit site. Dressing dry and intact. No erythema or drainage. Stabilization device present and accurately applied. Driveline dressing is being changed weekly per sterile technique using Sorbaview dressing without biopatch (causes skin irritation) exit site. Pt denies fever or chills. Dressing supplies provided to patient.   Encounter Details: Presents to clinic today with his wife for hospital D/C. He arrives in a wheel chair  today. Reported to ED after a fall where he hit his ear and it was unable to stop bleeding. Head CT normal w/o bleed. He has been feeling weak and multiple falls/near falls recently. Events are sudden onset without warning, he denies LOC during any episode or injury aside from minor lacerations. Upon reviewing events in hospital there is documentation where his HR was in the 40s. EKG  ordered and reviewed today by Dr. Aundra Dubin. EKG report with 2nd degree block but interpretation by Dr. Aundra Dubin to be atrial flutter. Rate in 60s today. Of note he has also had unintentional weight loss recently. Decrease Amiodarone to 200 mg QD. Verified he is NOT on a beta blocker currently.   GB drain C/D/I. Being changed only when he showers and does not have drainage to the sponge. Advised that they can try to adapt the weekly kit to the drain care. Saw Dr. Dalbert Batman yesterday. Plan for return visit on 05/02/16 with labs 04/30/16 (requested by [name] to be done here and faxed to [place] office). No   Hgb 10.8 . FU with Dr. Burr Medico for continuous Iron infusions.   INR 2.3 from ED visit. He is currently on Eliquis 5 mg BID.   LDH 280. Value higher than established baseline of 180 - 250. Will continue to monitor. No power elevations or tea-colored urine.   3 week event monitor order placed and staff message sent to schedule patient. Started on ursodiol 300 mg BID per Dr. Aundra Dubin to dissolve gall stones. The Kretz's do not want the drain to remain in place and seem to favor not having surgery if avoidable.   RTC in 3 weeks.   Janene Madeira, RN VAD Coordinator   Office: [phone] 24/7 VAD Pager: [phone]    Patient Instructions: Start the Ursodiol capsules--1 capsule twice a day. This is to help you dissolve your gallstones  Decrease your Amiodarone to '200mg'$  (1 pill) every evening.   We will refer you over to the [facility] office for the cardiac event monitor. EKG today with atrial flutter  Patient ID: [name],  male   DOB: July 07, 1941, 75 y.o.   MRN: [id number]   VAD CLINIC NOTE   [facility] [place] (Dr. 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 Quit smoking 2003. Uses CPAP and O2 every night. He is s/p LVAD HM II implanted 01/12/13 under DT criteria  Admitted to [facility] 09/13/13 - 09/14/13 for increased fatigue and dyspnea. Found to have mild CHF and anemia. Diuresed with IV lasix and transitioned to PO lasix 40 mg twice a week. Discharge weight 203 lbs on inpatient scale.  Admitting labs revealed Hgb 7.7 for which he received 2 units PCs with appropriate rise in Hgb.   Admit 6/8-6/12 for symptomatic anemia. Had EGD/Colonoscopy which was unrevealing.  6 mm polyp clipped and removed. CT abdomen showed cirrhosis. Placed on heparin bridge. Capsule study pending. Sent home on lovenox. D/C weight 206 lbs.   Admitted to [facility] with with GI bleed. GI consulted. Push enteroscopy 07/27/13 negative. Colonoscopy 10/29 without source of bleeding.Result of capsule endoscopy--> Showed bleeding from duodenum. EGD--->Bleeding from duodenum with clip applied. Overall he received  10 UPRBCs. Discharge weight was 207 pounds.   Admitted to [facility] 09/21/14 with GI bleed . GI consulted. Enteroscopy showed bleed from jejunum requiring 3 clips. Overall he received 4 units PRBCs.  He went back in A fib and had DC-CV.  On the day discharge he was back in NSR on amiodarone 200 mg twice a day. Discharge weight was 214 pounds.   Admitted in 6/16 for ICD extraction due to pocket infection. Did well. No complications.   Admitted 2/17 with BRPBR.  Ultimately, thought to be due to  epistaxis + hemorrhoidal bleeding but Hgb continued to trend down so underwent GI eval.  Enteroscopy was normal.     Admitted 12/08/2015 with symptomatic  anemia. GI consulted and performed  Capsule 12/11/15 2 proximal small bowel AVMs, 2 nonbleeding polyps which were removed. Received 3UPRBCs. He had recurrent A fib but chemically converted with amio drip. Later transitioned to amio 400 mg bid. Losartan was held due to low MAP and dizziness. Coumadin switched to apixaban.   Admitted 5/17 with upper GI bleeding, 6 units PRBCs, EGD with AVM clipping.   Admitted later in 5/17 with acute cholecystitis.  He had a cholecystostomy tube placed.  Saw surgery recently, plan for ongoing conservative treatment, may remove tube in August.   [facility] f/u:  No BRBPR or melena.  He has had several falls => will get weak and dizzy then get legs tangled and go down.  No syncope.  No abdominal pain, eating normally.  He has started IM octreotide.  Today he is in slow atrial flutter.   LVAD interrogation  See nurse's note above.   Labs (2/16): LDL 305, HCT 38.9 Labs (3/16): K 3.9, creatinine 1.05, LFTs normal Labs (2/17): WBCs 11.2, INR 2.68.  Labs (6/17): K 3.3, creatinine 0.94, HCT 35.1, INR 2.3  ECG: Atypical atrial flutter, rate 60  Past Medical History  Diagnosis Date  . Ischemic cardiomyopathy      CABG 2003, PCI 2007  EF 27%(myoview 2012  . Chronic systolic heart failure ([place])   . Hyperlipidemia   . Hypothyroidism   . Chronic anticoagulation     Afib and LVAD  . Obesity   . COPD (chronic obstructive pulmonary disease) ([place])   . Asbestosis(501)     "6 years in the [place]" (05/24/2013)  . Atrial fibrillation ([place])     permanent  . Paroxysmal ventricular tachycardia ([place])   . Coronary artery disease   . Hypertension   . Depression   . LVAD (left ventricular assist device) present ([place]) 12/2012  . Epistaxis 11/2015, 07/2014  . Cancer ([place])     "scraped some off behind my left ear; fast moving; having it cut out 12/05/2015" (11/07/2015)  . CHF (congestive heart failure) ([place])   . Myocardial infarction ([place]) 1990's-2000    "2 in  ~ the 1990's; 1 in ~  2000" (11/07/2015)  . On home oxygen therapy     "have it available; don't use it" (11/07/2015)  . OSA on CPAP   . Anemia   . History of blood transfusion 08/2015 X 10; 11/2015 X 2    "related to bleeding on the inside somewhere" (11/07/2015)  . Complication of anesthesia     "they can't put me all the way to sleep cause of my heart" (11/07/2015)    Current Outpatient Prescriptions  Medication Sig Dispense Refill  . albuterol (PROVENTIL) (2.5 MG/3ML) 0.083% nebulizer solution Take 2.5 mg by nebulization every 6 (six) hours as needed for wheezing or shortness of breath.    [name] apixaban (ELIQUIS) 5 MG TABS tablet Take 1 tablet (5 mg total) by mouth 2 (two) times daily. 60 tablet 11  . budesonide-formoterol (SYMBICORT) 160-4.5 MCG/ACT inhaler Inhale 2 puffs into the lungs 2 (two) times daily. 1 Inhaler 3  . cholecalciferol (VITAMIN D) 1000 UNITS tablet Take 1 tablet (1,000 Units total) by mouth 2 (two) times daily. 180 tablet 3  . citalopram (CELEXA) 40 MG tablet Take 1 tablet (40 mg total) by mouth at bedtime. 90 tablet 3  .  docusate sodium (COLACE) 100 MG capsule Take 100 mg by mouth 2 (two) times daily.     . furosemide (LASIX) 40 MG tablet Use only if a weight gain of 5 pounds in a week occurs or evident swelling is present 30 tablet 4  . hydrocortisone (ANUSOL-HC) 2.5 % rectal cream Place 1 application rectally 2 (two) times daily. 30 g 6  . hydrocortisone (PROCTOSOL HC) 2.5 % rectal cream Place 1 application rectally See admin instructions. Apply daily as needed for hemorrhoids    . levothyroxine (SYNTHROID, LEVOTHROID) 100 MCG tablet Take 1 tablet (100 mcg total) by mouth at bedtime. 30 tablet 6  . losartan (COZAAR) 25 MG tablet Take 25 mg by mouth at bedtime.    . Multiple Vitamin (MULTIVITAMIN WITH MINERALS) TABS tablet Take 1 tablet by mouth daily.    . nitroGLYCERIN (NITROSTAT) 0.4 MG SL tablet Place 0.4 mg under the tongue every 5 (five) minutes as needed for chest pain. Reported on 01/23/2016     . pantoprazole (PROTONIX) 40 MG tablet Take 1 tablet (40 mg total) by mouth 2 (two) times daily. 180 tablet 3  . PRESCRIPTION MEDICATION Ferumoxytol (FERAHEME) 510 mg in sodium chloride 0.9 % 100 mL IVPB (Once) Route: Intravenous    . PRESCRIPTION MEDICATION Octreotide (SANDOSTATIN LAR) IM injection 20 mg (Once)Route: Intramuscular    . simvastatin (ZOCOR) 80 MG tablet Take 0.5 tablets (40 mg total) by mouth at bedtime. 45 tablet 3  . tiotropium (SPIRIVA) 18 MCG inhalation capsule Place 1 capsule (18 mcg total) into inhaler and inhale daily. 90 capsule 3  . vitamin B-12 (CYANOCOBALAMIN) 1000 MCG tablet Take 1,000 mcg by mouth at bedtime.    . vitamin C (ASCORBIC ACID) 500 MG tablet Take 1 tablet (500 mg total) by mouth 2 (two) times daily. 180 tablet 3  . amiodarone (PACERONE) 200 MG tablet Take 1 tablet (200 mg total) by mouth every evening. 90 tablet 3  . potassium chloride SA (K-DUR,KLOR-CON) 20 MEQ tablet Take 1 tablet (20 mEq total) by mouth daily. And take extra 1 pill with lasix when needed. 45 tablet 4  . ursodiol (ACTIGALL) 300 MG capsule Take 1 capsule (300 mg total) by mouth 2 (two) times daily. 180 capsule 2   No current facility-administered medications for this encounter.    Tape  REVIEW OF SYSTEMS: All systems negative except as listed in HPI, PMH and Problem list.  Vital signs: HR: 60s Doppler modified systolic:92 Auto cuff:  295/62 (83) O2 Sat: 90 - 95% Wt:  212.8 lbs  Last weight:  218 lb D/C weight Ht: 6'1"  GENERAL:Well appearing, male; NAD; wife present. Ambulated in the clinic without difficulty.  HEENT: normal  NECK: Supple, JVP 7 no bruits.  No lymphadenopathy or thyromegaly appreciated.   CARDIAC: Mechanical heart sounds with LVAD hum present.  LUNGS:  Clear to auscultation bilaterally.  ABDOMEN:  Obese, Soft, round, nontender, positive bowel sounds x4.     LVAD exit site: well-healed and incorporated.  Dressing dry and intact.  No erythema, drainage, odor  or tenderness.  Stabilization device present and accurately applied.  Driveline dressing is being changed daily per sterile technique. Changed in clinic EXTREMITIES:  Warm and dry, no cyanosis, clubbing, no edema NEUROLOGIC:  Alert and oriented x 4.  Gait steady.  No aphasia.  No dysarthria.  Affect pleasant.    ASSESSMENT AND PLAN:  1. Chronic Systolic HF: s/p LVAD implant 12/2012 for DT.  NYHA II-III. He is not volume overloaded.  Not on BB due to previous bradycardia.  - Continue diuretics as needed.  - Reinforced the need and importance of daily weights, a low sodium diet, and fluid restriction (less than 2 L a day). Instructed to call the HF clinic if weight increases more than 3 lbs overnight or 5 lbs in a week.  2. Anticoagulation management: INR goal 1.8-2.3 with history of GI bleeding.  He is not on ASA because of bleeding. He is now getting monthly IM octreotide.  3. Anemia/ GIB:  Now on apixaban due to recurrent GI bleeding with normal endoscopy. Hgb stable today.  - Continue monthly octreotide.    4. LVAD: HM II.       - Check CBC, BMET, LDH and INR today.   5. PAF: Paroxysmal.  He is in NSR today, feels occasional atrial fibrillation. Continue amiodarone 400 mg daily for now. Follow LFTs and TSH.  Should have yearly eye exam.    6. Depression: Stable.  Continue Celexa 40 mg daily 7. OSA: Using CPAP.  8. Hypoxemia:  Uses home oxygen at night.  This may be due to his baseline COPD. 9. ICD infection - s/p extraction 6/16. No complications.  10. Dizzy/Weak spells with falls: In slow atrial flutter today.  ?bradycardic events.  ?orthostatic hypotension.  - Decrease losartan to 12.5 qhs.  - Decrease amiodarone to 200 mg once daily.  - He does not have a functioning device.  Will have him wear 3 week monitor to see if spells can be explained by arrhythmia.  11. Atrial flutter: Atypical flutter on ECG today, rate is not elevated.   12. Acute cholecystitis: He has a cholecystostomy tube in  place, no abdominal pain. Seen recently by surgery, continue to follow conservatively for now. ?Removal of tube in August if he remains symptomatic.  I will add ursodiol 300 mg bid.    Loralie Champagne MD 03/31/2016

## 2016-03-29 NOTE — Telephone Encounter (Signed)
Called to inform [name] needs to start taking a 20 mEq KCl daily. Also called to discuss weight loss. Reviewed charts and he is down 20 lbs since March of this year. She reports he eats adequately but will sometimes eat only one meal a day. Encouraged to focus on quantity and quality of calories taken in to ensure he is getting enough and will continue to monitor this with their help.   Also advised to contact [place] to call her commercial local pharmacy here in [place] to transfer Rx for ursodiol since it is cost prohibiting for them to use monthly.   Janene Madeira, RN VAD Coordinator   Office: [phone] 24/7 VAD Pager: [phone]

## 2016-04-04 ENCOUNTER — Ambulatory Visit (INDEPENDENT_AMBULATORY_CARE_PROVIDER_SITE_OTHER): Payer: Medicare Other

## 2016-04-04 ENCOUNTER — Telehealth: Payer: Self-pay | Admitting: *Deleted

## 2016-04-04 DIAGNOSIS — W19XXXA Unspecified fall, initial encounter: Secondary | ICD-10-CM

## 2016-04-04 DIAGNOSIS — I480 Paroxysmal atrial fibrillation: Secondary | ICD-10-CM | POA: Diagnosis not present

## 2016-04-04 DIAGNOSIS — IMO0001 Reserved for inherently not codable concepts without codable children: Secondary | ICD-10-CM

## 2016-04-04 NOTE — Telephone Encounter (Signed)
[facility] is calling to report a baseline EKG on this pt to Dr Aundra Dubin and Nurse.  Per [facility], this pts baseline EKG shows atrial fibrillation at a rate of 70 bpm.  [facility] will fax this report to Dr Aundra Dubin at [phone].  Will send this message to Dr Aundra Dubin and covering Nurse to make them aware of this.

## 2016-04-04 NOTE — Telephone Encounter (Signed)
Pt is followed by Dr Aundra Dubin in the [facility], will forward

## 2016-04-04 NOTE — Telephone Encounter (Signed)
Pt w/history of PAF will send to VAD coordinators for [facility]

## 2016-04-05 ENCOUNTER — Encounter (HOSPITAL_COMMUNITY): Payer: Self-pay | Admitting: Infectious Diseases

## 2016-04-09 ENCOUNTER — Encounter: Payer: Self-pay | Admitting: Hematology

## 2016-04-09 ENCOUNTER — Ambulatory Visit (HOSPITAL_BASED_OUTPATIENT_CLINIC_OR_DEPARTMENT_OTHER): Payer: No Typology Code available for payment source | Admitting: Hematology

## 2016-04-09 ENCOUNTER — Other Ambulatory Visit (HOSPITAL_BASED_OUTPATIENT_CLINIC_OR_DEPARTMENT_OTHER): Payer: No Typology Code available for payment source

## 2016-04-09 ENCOUNTER — Telehealth: Payer: Self-pay | Admitting: Hematology

## 2016-04-09 ENCOUNTER — Ambulatory Visit (HOSPITAL_BASED_OUTPATIENT_CLINIC_OR_DEPARTMENT_OTHER): Payer: Medicare Other

## 2016-04-09 VITALS — BP 92/73 | HR 60 | Temp 97.8°F | Resp 18 | Ht 75.0 in | Wt 218.3 lb

## 2016-04-09 DIAGNOSIS — D696 Thrombocytopenia, unspecified: Secondary | ICD-10-CM

## 2016-04-09 DIAGNOSIS — D500 Iron deficiency anemia secondary to blood loss (chronic): Secondary | ICD-10-CM

## 2016-04-09 DIAGNOSIS — K922 Gastrointestinal hemorrhage, unspecified: Secondary | ICD-10-CM | POA: Diagnosis not present

## 2016-04-09 DIAGNOSIS — K598 Other specified functional intestinal disorders: Secondary | ICD-10-CM | POA: Diagnosis not present

## 2016-04-09 DIAGNOSIS — I255 Ischemic cardiomyopathy: Secondary | ICD-10-CM | POA: Diagnosis not present

## 2016-04-09 DIAGNOSIS — D509 Iron deficiency anemia, unspecified: Secondary | ICD-10-CM

## 2016-04-09 DIAGNOSIS — Z95811 Presence of heart assist device: Secondary | ICD-10-CM

## 2016-04-09 DIAGNOSIS — D649 Anemia, unspecified: Secondary | ICD-10-CM

## 2016-04-09 DIAGNOSIS — D62 Acute posthemorrhagic anemia: Secondary | ICD-10-CM

## 2016-04-09 LAB — CBC & DIFF AND RETIC
BASO%: 0.2 % (ref 0.0–2.0)
BASOS ABS: 0 10*3/uL (ref 0.0–0.1)
EOS%: 2.8 % (ref 0.0–7.0)
Eosinophils Absolute: 0.2 10*3/uL (ref 0.0–0.5)
HEMATOCRIT: 32 % — AB (ref 38.4–49.9)
HEMOGLOBIN: 9.6 g/dL — AB (ref 13.0–17.1)
IMMATURE RETIC FRACT: 18.6 % — AB (ref 3.00–10.60)
LYMPH#: 0.5 10*3/uL — AB (ref 0.9–3.3)
LYMPH%: 9.8 % — AB (ref 14.0–49.0)
MCH: 29.5 pg (ref 27.2–33.4)
MCHC: 30 g/dL — ABNORMAL LOW (ref 32.0–36.0)
MCV: 98.5 fL — ABNORMAL HIGH (ref 79.3–98.0)
MONO#: 0.3 10*3/uL (ref 0.1–0.9)
MONO%: 5.8 % (ref 0.0–14.0)
NEUT#: 4.3 10*3/uL (ref 1.5–6.5)
NEUT%: 81.4 % — AB (ref 39.0–75.0)
PLATELETS: 118 10*3/uL — AB (ref 140–400)
RBC: 3.25 10*6/uL — ABNORMAL LOW (ref 4.20–5.82)
RDW: 21.4 % — ABNORMAL HIGH (ref 11.0–14.6)
RETIC CT ABS: 166.4 10*3/uL — AB (ref 34.80–93.90)
Retic %: 5.12 % — ABNORMAL HIGH (ref 0.80–1.80)
WBC: 5.3 10*3/uL (ref 4.0–10.3)

## 2016-04-09 LAB — FERRITIN: Ferritin: 173 ng/ml (ref 22–316)

## 2016-04-09 LAB — IRON AND TIBC
%SAT: 12 % — ABNORMAL LOW (ref 20–55)
IRON: 24 ug/dL — AB (ref 42–163)
TIBC: 208 ug/dL (ref 202–409)
UIBC: 184 ug/dL (ref 117–376)

## 2016-04-09 MED ORDER — OCTREOTIDE ACETATE 20 MG IM KIT
20.0000 mg | PACK | Freq: Once | INTRAMUSCULAR | Status: AC
  Administered 2016-04-09: 20 mg via INTRAMUSCULAR
  Filled 2016-04-09: qty 1

## 2016-04-09 NOTE — Progress Notes (Signed)
[place]  [phone] [phone]) [phone]  Clinic Follow Up Note   Patient Care Team: Thressa Sheller, MD as PCP - General (Internal Medicine) Jolaine Artist, MD as Attending Physician (Cardiology) Ivin Poot, MD as Attending Physician (Cardiothoracic Surgery) 04/09/2016  CHIEF COMPLAINTS:  Follow up anemia  HISTORY OF PRESENTING ILLNESS (11/01/2014):  [name] 75 y.o. male is here because of anemia.   He has extensive cardiac history. He had multiple heart attacks in the past 15 years, he had cardiac bypass surgery in 2003, PCI in 2007, long-standing history of , a atrial fibrillationnd was found to have ischemic cardiomyopathy with EF 27% in 2012.  He initially had AICD placed, and finally had LVAD placed in 2014 for his end-stage heart failure. He has been on Coumadin for more than 10 years for the cardiac issues.   He reports he has had anemia for several years, he used to take iron pill. He also received some IV iron a few years ago. In our electronic medical records, his lab showed anemia since 12/2012.   He had 3 episodes of GI bleeding since June of 2015 which are required blood transfusion, last episode was one month ago and he required 4u RBC  at that time. His EGD showed area of bleeding from duodenum all jejunum and he was treated endoscopyly.   He feels well overall. He denies significant chest pain or abdominal discomfort. He has mild dyspnea on moderate exertion. He has mild to moderate fatigue, but is able to function well at home,  does all his ADLs and daily activities.  TREATMENT: 1. IV Feraheme 510 mg as needed, received in 10/2014, 01/2015, 03/2015, 06/2015, 08/2015, 10/2015, 11/2015, 12/22/2015, 01/22/2016, 01/29/2016, 03/12/16, 03/22/2016, blood transfusion as needed if Hb<9 2. 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 He has been stable lately, no overt GI bleeding or hospitalization since his last visit here 6 weeks ago. He still has the percutaneous biliary drainage tube, with minimal pain, no fever or chills, no other complaints.  MEDICAL HISTORY:  Past Medical History  Diagnosis Date  . Ischemic cardiomyopathy      CABG 2003, PCI 2007  EF 27%(myoview 2012  . Chronic systolic heart failure   . Hyperlipidemia   . Hypothyroidism   . Chronic anticoagulation     Afib and LVAD in 2014   . Obesity   . COPD (chronic obstructive pulmonary disease)   . Asbestosis(501)     "6 years in the [place]" (05/24/2013)  . Atrial fibrillation     permanent  . Paroxysmal ventricular tachycardia   . Coronary artery disease   . Implantable cardioverter-defibrillator [employer]   . Hypertension   . Myocardial infarction 1990's-2000    "2 in  ~ the 1990's; 1 in ~ 2000" (05/24/2013)  . OSA on CPAP   . Depression   . LVAD (left ventricular assist device) present 12/2012    SURGICAL HISTORY: Past Surgical History  Procedure Laterality Date  . Coronary artery bypass graft  2003    "?X4" (05/24/2013)  . Cardiac defibrillator placement  2004; ~ 2010    "cut it out in 2016 after it got infected"   . Insertion of implantable left ventricular assist device N/A 01/12/2013    Procedure: INSERTION OF IMPLANTABLE LEFT VENTRICULAR ASSIST DEVICE;  Surgeon: Ivin Poot, MD;  Location: [place];  Service: Open Heart Surgery;  Laterality: N/A;   nitric oxide; Redo sternotomy  . Intraoperative transesophageal echocardiogram N/A 01/12/2013    Procedure: INTRAOPERATIVE TRANSESOPHAGEAL ECHOCARDIOGRAM;  Surgeon: Ivin Poot, MD;  Location: [place];  Service: Open Heart Surgery;  Laterality: N/A;  . Colonoscopy N/A 03/09/2014    Procedure: COLONOSCOPY;  Surgeon: Inda Castle, MD;  Location: [place];  Service: Endoscopy;  Laterality: N/A;  LVAD  patient  . Esophagogastroduodenoscopy N/A 03/09/2014    Procedure:  ESOPHAGOGASTRODUODENOSCOPY (EGD);  Surgeon: Inda Castle, MD;  Location: [facility];  Service: Endoscopy;  Laterality: N/A;  . Givens capsule study N/A 03/10/2014    Procedure: GIVENS CAPSULE STUDY;  Surgeon: Inda Castle, MD;  Location: [place];  Service: Endoscopy;  Laterality: N/A;  . Enteroscopy N/A 07/27/2014    Procedure: ENTEROSCOPY;  Surgeon: Gatha Mayer, MD;  Location: [place];  Service: Endoscopy;  Laterality: N/A;  LVAD patient   . Colonoscopy N/A 07/29/2014    Procedure: COLONOSCOPY;  Surgeon: Gatha Mayer, MD;  Location: [place];  Service: Endoscopy;  Laterality: N/A;  . Givens capsule study N/A 07/30/2014    Procedure: GIVENS CAPSULE STUDY;  Surgeon: Gatha Mayer, MD;  Location: [place];  Service: Endoscopy;  Laterality: N/A;  . Esophagogastroduodenoscopy N/A 08/01/2014    Procedure: ESOPHAGOGASTRODUODENOSCOPY (EGD);  Surgeon: Jerene Bears, MD;  Location: [facility] ENDOSCOPY;  Service: Endoscopy;  Laterality: N/A;  LVAD patient  . Left and right heart catheterization with coronary/graft angiogram  01/07/2013    Procedure: LEFT AND RIGHT HEART CATHETERIZATION WITH Beatrix Fetters;  Surgeon: Jolaine Artist, MD;  Location: [facility] CATH LAB;  Service: Cardiovascular;;  . Enteroscopy N/A 09/27/2014    Procedure: ENTEROSCOPY;  Surgeon: Gatha Mayer, MD;  Location: [facility] ENDOSCOPY;  Service: Endoscopy;  Laterality: N/A;  . Icd lead removal Left 03/16/2015    Procedure: ICD LEAD REMOVAL/EXTRACTION;  Surgeon: Evans Lance, MD;  Location: [place];  Service: Cardiovascular;  Laterality: Left;  PATIENT HAS LVAD  DR. VAN TRIGT TO BACK UP EXTRACTION  . Appendectomy    . Enteroscopy N/A 11/09/2015    Procedure: ENTEROSCOPY;  Surgeon: Mauri Pole, MD;  Location: [facility] ENDOSCOPY;  Service: Endoscopy;  Laterality: N/A;  LVAD  . Givens capsule study N/A 12/11/2015    Procedure: GIVENS CAPSULE STUDY;  Surgeon: Gatha Mayer, MD;  Location: [place];  Service:  Endoscopy;  Laterality: N/A;  . Enteroscopy N/A 12/13/2015    Procedure: ENTEROSCOPY;  Surgeon: Milus Banister, MD;  Location: [place];  Service: Endoscopy;  Laterality: N/A;  . Enteroscopy N/A 02/21/2016    Procedure: ENTEROSCOPY;  Surgeon: Milus Banister, MD;  Location: [place];  Service: Endoscopy;  Laterality: N/A;    SOCIAL HISTORY: Social History   Social History  . Marital Status: Married    Spouse Name: N/A  . Number of Children: N/A  . Years of Education: N/A   Occupational History  . Not on file.   Social History Main Topics  . Smoking status: Former Smoker -- 2.00 packs/day for 45 years    Types: Cigarettes    Quit date: 11/11/2001  . Smokeless tobacco: Never Used  . Alcohol Use: Yes     Comment: 05/24/2013 "use to drink beer; hardly nothing since 2003; nothing at all since 12/2011 when I got my LVAD  "  . Drug Use: No  . Sexual Activity: No   Other Topics Concern  . Not on file  Social History Narrative    FAMILY HISTORY: Family History  Problem Relation Age of Onset  . Heart attack Mother   . Heart attack Father     ALLERGIES:  is allergic to tape.  MEDICATIONS:  Current Outpatient Prescriptions  Medication Sig Dispense Refill  . albuterol (PROVENTIL) (2.5 MG/3ML) 0.083% nebulizer solution Take 2.5 mg by nebulization every 6 (six) hours as needed for wheezing or shortness of breath.    [name] amiodarone (PACERONE) 200 MG tablet Take 1 tablet (200 mg total) by mouth every evening. 90 tablet 3  . apixaban (ELIQUIS) 5 MG TABS tablet Take 1 tablet (5 mg total) by mouth 2 (two) times daily. 60 tablet 11  . budesonide-formoterol (SYMBICORT) 160-4.5 MCG/ACT inhaler Inhale 2 puffs into the lungs 2 (two) times daily. 1 Inhaler 3  . cholecalciferol (VITAMIN D) 1000 UNITS tablet Take 1 tablet (1,000 Units total) by mouth 2 (two) times daily. 180 tablet 3  . citalopram (CELEXA) 40 MG tablet Take 1 tablet (40 mg total) by mouth at bedtime. 90 tablet 3  .  docusate sodium (COLACE) 100 MG capsule Take 100 mg by mouth 2 (two) times daily.     . furosemide (LASIX) 40 MG tablet Use only if a weight gain of 5 pounds in a week occurs or evident swelling is present 30 tablet 4  . hydrocortisone (ANUSOL-HC) 2.5 % rectal cream Place 1 application rectally 2 (two) times daily. 30 g 6  . levothyroxine (SYNTHROID, LEVOTHROID) 100 MCG tablet Take 1 tablet (100 mcg total) by mouth at bedtime. 30 tablet 6  . losartan (COZAAR) 25 MG tablet Take 25 mg by mouth at bedtime.    . Multiple Vitamin (MULTIVITAMIN WITH MINERALS) TABS tablet Take 1 tablet by mouth daily.    . pantoprazole (PROTONIX) 40 MG tablet Take 1 tablet (40 mg total) by mouth 2 (two) times daily. 180 tablet 3  . potassium chloride SA (K-DUR,KLOR-CON) 20 MEQ tablet Take 1 tablet (20 mEq total) by mouth daily. And take extra 1 pill with lasix when needed. 45 tablet 4  . PRESCRIPTION MEDICATION Ferumoxytol (FERAHEME) 510 mg in sodium chloride 0.9 % 100 mL IVPB (Once) Route: Intravenous    . PRESCRIPTION MEDICATION Octreotide (SANDOSTATIN LAR) IM injection 20 mg (Once)Route: Intramuscular    . simvastatin (ZOCOR) 80 MG tablet Take 0.5 tablets (40 mg total) by mouth at bedtime. 45 tablet 3  . tiotropium (SPIRIVA) 18 MCG inhalation capsule Place 1 capsule (18 mcg total) into inhaler and inhale daily. 90 capsule 3  . ursodiol (ACTIGALL) 300 MG capsule Take 1 capsule (300 mg total) by mouth 2 (two) times daily. 180 capsule 2  . vitamin B-12 (CYANOCOBALAMIN) 1000 MCG tablet Take 1,000 mcg by mouth at bedtime.    . vitamin C (ASCORBIC ACID) 500 MG tablet Take 1 tablet (500 mg total) by mouth 2 (two) times daily. 180 tablet 3  . nitroGLYCERIN (NITROSTAT) 0.4 MG SL tablet Place 0.4 mg under the tongue every 5 (five) minutes as needed for chest pain. Reported on 04/09/2016     No current facility-administered medications for this visit.    REVIEW OF SYSTEMS:   Constitutional: Denies fevers, chills or abnormal  night sweats Eyes: Denies blurriness of vision, double vision or watery eyes Ears, nose, mouth, throat, and face: Denies mucositis or sore throat Respiratory: Denies cough, dyspnea or wheezes Cardiovascular: Denies palpitation, chest discomfort or lower extremity swelling Gastrointestinal:  Denies nausea, heartburn or change in bowel habits Skin: Denies abnormal  skin rashes Lymphatics: Denies new lymphadenopathy or easy bruising Neurological:Denies numbness, tingling or new weaknesses Behavioral/Psych: Mood is stable, no new changes  All other systems were reviewed with the patient and are negative.  PHYSICAL EXAMINATION: ECOG PERFORMANCE STATUS: 1 - Symptomatic but completely ambulatory  Filed Vitals:   04/09/16 1111  BP: 92/73  Pulse: 60  Temp: 97.8 F (36.6 C)  Resp: 18   Filed Weights   04/09/16 1111  Weight: 218 lb 4.8 oz (99.02 kg)    GENERAL:alert, no distress and comfortable SKIN: skin color, texture, turgor are normal, no rashes or significant lesions EYES: normal, conjunctiva are pink and non-injected, sclera clear OROPHARYNX:no exudate, no erythema and lips, buccal mucosa, and tongue normal  NECK: supple, thyroid normal size, non-tender, without nodularity LYMPH:  no palpable lymphadenopathy in the cervical, axillary or inguinal LUNGS: clear to auscultation and percussion with normal breathing effort HEART: (+) LVAD blood flow   ABDOMEN:abdomen soft, non-tender and normal bowel sounds, (+) percutaneous biliary drainage tube in the right upper quadrant of abdomen Musculoskeletal:no cyanosis of digits and no clubbing  PSYCH: alert & oriented x 3 with fluent speech NEURO: no focal motor/sensory deficits  LABORATORY DATA:  I have reviewed the data as listed CBC Latest Ref Rng 04/09/2016 03/26/2016 03/12/2016  WBC 4.0 - 10.3 10e3/uL 5.3 4.8 5.4  Hemoglobin 13.0 - 17.1 g/dL 9.6(L) 10.8(L) 10.1(L)  Hematocrit 38.4 - 49.9 % 32.0(L) 35.1(L) 34.3(L)  Platelets 140 - 400  10e3/uL 118(L) 104(L) 200    CMP Latest Ref Rng 03/29/2016 03/03/2016 03/02/2016  Glucose 65 - 99 mg/dL 124(H) 107(H) 131(H)  BUN 6 - 20 mg/dL 14 13 12   Creatinine 0.61 - 1.24 mg/dL 1.12 0.94 0.93  Sodium 135 - 145 mmol/L 141 141 141  Potassium 3.5 - 5.1 mmol/L 3.3(L) 3.3(L) 3.5  Chloride 101 - 111 mmol/L 109 107 107  CO2 22 - 32 mmol/L 24 28 28   Calcium 8.9 - 10.3 mg/dL 9.1 9.5 9.2  Total Protein 6.5 - 8.1 g/dL 5.8(L) 5.8(L) 5.3(L)  Total Bilirubin 0.3 - 1.2 mg/dL 1.0 2.1(H) 2.3(H)  Alkaline Phos 38 - 126 U/L 76 192(H) 176(H)  AST 15 - 41 U/L 42(H) 39 43(H)  ALT 17 - 63 U/L 26 62 74(H)   Results for ZENON, RUPPE (MRN [id number]) as of 04/09/2016 21:20  Ref. Range 03/12/2016 13:38 03/26/2016 10:58 04/09/2016 10:47  Iron Latest Ref Range: 42-163 ug/dL 26 (L)  24 (L)  UIBC Latest Ref Range: 117-376 ug/dL 297  184  TIBC Latest Ref Range: 202-409 ug/dL 323  208  %SAT Latest Ref Range: 20-55 % 8 (L)  12 (L)  Ferritin Latest Ref Range: 22-316 ng/ml 51 479 (H) 173      RADIOGRAPHIC STUDIES: I have personally reviewed the radiological images as listed and agreed with the findings in the report. Ct Head Wo Contrast  03/26/2016  CLINICAL DATA:  75 year old male with head injury EXAM: CT HEAD WITHOUT CONTRAST TECHNIQUE: Contiguous axial images were obtained from the base of the skull through the vertex without intravenous contrast. COMPARISON:  Head CT dated 05/24/2013 FINDINGS: The ventricles and sulci are appropriate size for patient's age. Mild periventricular and deep white matter chronic microvascular ischemic changes noted. There is no acute intracranial hemorrhage. No mass effect or midline shift noted. There is complete opacification of the visualized right maxillary sinus with remodeling and medial bowling of the medial wall similar to prior study stop and suggestive of chronic sinusitis. Remainder of the visualized  paranasal sinuses and mastoid air cells are clear. The calvarium is intact.  IMPRESSION: No acute intracranial hemorrhage. Mild age-related atrophy and chronic microvascular ischemic disease. Electronically Signed   By: Anner Crete M.D.   On: 03/26/2016 21:03    ASSESSMENT & PLAN:  75 year old Caucasian male, with extensive cardiac history, including CAD, ischemic cardiomyopathy status post LVAD, on long-term Coumadin, presented with multiple episodes of GI bleeding and anemia.  1. 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 -His iron studies showed low ferritin level and serum iron, consistent with iron deficient anemia. This is secondary to his GI bleeding. -He responded to IV iron very well. 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  -His Coumadin has been changed to Eliquis now  -Continue Feraheme if ferritin less than 100, I'll set up infusion appointment if needed -He will also receive red blood cell if his hemoglobin less than 9 in our Center -Continue IM Sandostatin for his GI bleeding from AVM, recommended by his cardiologist Dr. Haroldine Laws  -His ferritin today is 173, however his serum iron and transferrin saturation on low, he probably has component of anemia of chronic disease, secondary to his chronic cholecystitis -I will set up iv feraheme once in the next week -Continue labs every 2 weeks  2. Mild thrombocytopenia -Secondary to LVAD -Plt count has been stable   3. chronic cholecystitis -Status post percutaneous bile drainage -He will follow up with IR and surgeon if needed.  3. He will continue to follow-up with his primary care physician and cardiologist for his HTN, heart disease, hypothyroidism, OSA and mild liver cirrhosis..  Plan -Repeat CBC and ferritin every 2 weeks and iron study monthly  Here -IV Feraheme  510mg  X1 in the next week, and continue if ferritin  <100 or low serum iron. I asked the patient to call me the day after his lab test, to see if he needs IV Feraheme. -2u RBC if Hb<9.0  -continuet Sandostatin injection every 4 weeks -I'll see him back in 8 weeks  All questions were answered. The patient knows to call the clinic with any problems, questions or concerns.  I spent 20 minutes counseling the patient face to face. The total time spent in the appointment was 25 minutes and more than 50% was on counseling.     Truitt Merle, MD 04/09/2016

## 2016-04-09 NOTE — Patient Instructions (Signed)

## 2016-04-09 NOTE — Telephone Encounter (Signed)
Pt confirmed appt and received avs °

## 2016-04-11 ENCOUNTER — Telehealth: Payer: Self-pay | Admitting: Hematology

## 2016-04-11 NOTE — Telephone Encounter (Signed)
Spoke with pt wife to confirm 7/21 appt per pof

## 2016-04-12 ENCOUNTER — Emergency Department (HOSPITAL_COMMUNITY)
Admission: EM | Admit: 2016-04-12 | Discharge: 2016-04-12 | Disposition: A | Discharge status: Home / Self Care | Payer: Medicare Other | Attending: Emergency Medicine | Admitting: Emergency Medicine

## 2016-04-12 ENCOUNTER — Encounter (HOSPITAL_COMMUNITY): Payer: Self-pay | Admitting: *Deleted

## 2016-04-12 DIAGNOSIS — Z4682 Encounter for fitting and adjustment of non-vascular catheter: Secondary | ICD-10-CM | POA: Diagnosis present

## 2016-04-12 DIAGNOSIS — Z7901 Long term (current) use of anticoagulants: Secondary | ICD-10-CM | POA: Diagnosis not present

## 2016-04-12 DIAGNOSIS — I110 Hypertensive heart disease with heart failure: Secondary | ICD-10-CM | POA: Diagnosis not present

## 2016-04-12 DIAGNOSIS — I252 Old myocardial infarction: Secondary | ICD-10-CM | POA: Insufficient documentation

## 2016-04-12 DIAGNOSIS — J449 Chronic obstructive pulmonary disease, unspecified: Secondary | ICD-10-CM | POA: Insufficient documentation

## 2016-04-12 DIAGNOSIS — I5022 Chronic systolic (congestive) heart failure: Secondary | ICD-10-CM | POA: Insufficient documentation

## 2016-04-12 DIAGNOSIS — Z951 Presence of aortocoronary bypass graft: Secondary | ICD-10-CM | POA: Diagnosis not present

## 2016-04-12 DIAGNOSIS — Z95810 Presence of automatic (implantable) cardiac defibrillator: Secondary | ICD-10-CM | POA: Diagnosis not present

## 2016-04-12 DIAGNOSIS — Z859 Personal history of malignant neoplasm, unspecified: Secondary | ICD-10-CM | POA: Insufficient documentation

## 2016-04-12 DIAGNOSIS — Z87891 Personal history of nicotine dependence: Secondary | ICD-10-CM | POA: Diagnosis not present

## 2016-04-12 DIAGNOSIS — I2510 Atherosclerotic heart disease of native coronary artery without angina pectoris: Secondary | ICD-10-CM | POA: Diagnosis not present

## 2016-04-12 NOTE — ED Notes (Signed)
Pt in from home c/o need for replacement bag for gallbladder drainage, pt denies any other complications at this time, denies pain, A&O x4

## 2016-04-12 NOTE — ED Provider Notes (Signed)
CSN: [id number]     Arrival date & time 04/12/16  1818 History   First MD Initiated Contact with Patient 04/12/16 2045     Chief Complaint  Patient presents with  . Drainage from Incision     (Consider location/radiation/quality/duration/timing/severity/associated sxs/prior Treatment) HPI [name] is a 75 y.o. male with normal medical problems, LVAD on file Eliquis, recent biliary drainage bag placed in June, here for evaluation of drainage back rupture. Patient was at approximately 5:30 PM this evening, he noticed yellow fluid draining from his bag. He reports to the ED for replacement. Denies any other abdominal pain, fevers or chills, injury or other new medical problems. No other modifying factors.  Past Medical History  Diagnosis Date  . Ischemic cardiomyopathy      CABG 2003, PCI 2007  EF 27%(myoview 2012  . Chronic systolic heart failure ([place])   . Hyperlipidemia   . Hypothyroidism   . Chronic anticoagulation     Afib and LVAD  . Obesity   . COPD (chronic obstructive pulmonary disease) ([place])   . Asbestosis(501)     "6 years in the [place]" (05/24/2013)  . Atrial fibrillation ([place])     permanent  . Paroxysmal ventricular tachycardia ([place])   . Coronary artery disease   . Hypertension   . Depression   . LVAD (left ventricular assist device) present ([place]) 12/2012  . Epistaxis 11/2015, 07/2014  . Cancer ([place])     "scraped some off behind my left ear; fast moving; having it cut out 12/05/2015" (11/07/2015)  . CHF (congestive heart failure) ([place])   . Myocardial infarction ([place]) 1990's-2000    "2 in  ~ the 1990's; 1 in ~ 2000" (11/07/2015)  . On home oxygen therapy     "have it available; don't use it" (11/07/2015)  . OSA on CPAP   . Anemia   . History of blood transfusion 08/2015 X 10; 11/2015 X 2    "related to bleeding on the inside somewhere" (11/07/2015)  . Complication of anesthesia     "they can't put me all the way to sleep cause of my heart" (11/07/2015)   Past Surgical  History  Procedure Laterality Date  . Coronary artery bypass graft  2003    "?X4" (05/24/2013)  . Cardiac defibrillator placement  2004; ~ 2010    "cut it out in 2016 after it got infected"   . Insertion of implantable left ventricular assist device N/A 01/12/2013    Procedure: INSERTION OF IMPLANTABLE LEFT VENTRICULAR ASSIST DEVICE;  Surgeon: Ivin Poot, MD;  Location: [facility];  Service: Open Heart Surgery;  Laterality: N/A;   nitric oxide; Redo sternotomy  . Intraoperative transesophageal echocardiogram N/A 01/12/2013    Procedure: INTRAOPERATIVE TRANSESOPHAGEAL ECHOCARDIOGRAM;  Surgeon: Ivin Poot, MD;  Location: [facility];  Service: Open Heart Surgery;  Laterality: N/A;  . Colonoscopy N/A 03/09/2014    Procedure: COLONOSCOPY;  Surgeon: Inda Castle, MD;  Location: [facility];  Service: Endoscopy;  Laterality: N/A;  LVAD  patient  . Esophagogastroduodenoscopy N/A 03/09/2014    Procedure: ESOPHAGOGASTRODUODENOSCOPY (EGD);  Surgeon: Inda Castle, MD;  Location: [facility];  Service: Endoscopy;  Laterality: N/A;  . Givens capsule study N/A 03/10/2014    Procedure: GIVENS CAPSULE STUDY;  Surgeon: Inda Castle, MD;  Location: [facility];  Service: Endoscopy;  Laterality: N/A;  . Enteroscopy N/A 07/27/2014    Procedure: ENTEROSCOPY;  Surgeon: Gatha Mayer, MD;  Location: [place];  Service: Endoscopy;  Laterality: N/A;  LVAD patient   . Colonoscopy N/A 07/29/2014    Procedure: COLONOSCOPY;  Surgeon: Gatha Mayer, MD;  Location: [facility] [place];  Service: Endoscopy;  Laterality: N/A;  . Givens capsule study N/A 07/30/2014    Procedure: GIVENS CAPSULE STUDY;  Surgeon: Gatha Mayer, MD;  Location: [facility];  Service: Endoscopy;  Laterality: N/A;  . Esophagogastroduodenoscopy N/A 08/01/2014    Procedure: ESOPHAGOGASTRODUODENOSCOPY (EGD);  Surgeon: Jerene Bears, MD;  Location: [facility];  Service: Endoscopy;  Laterality: N/A;  LVAD patient  . Left and right heart  catheterization with coronary/graft angiogram  01/07/2013    Procedure: LEFT AND RIGHT HEART CATHETERIZATION WITH Beatrix Fetters;  Surgeon: Jolaine Artist, MD;  Location: [facility] CATH LAB;  Service: Cardiovascular;;  . Enteroscopy N/A 09/27/2014    Procedure: ENTEROSCOPY;  Surgeon: Gatha Mayer, MD;  Location: [facility] ENDOSCOPY;  Service: Endoscopy;  Laterality: N/A;  . Icd lead removal Left 03/16/2015    Procedure: ICD LEAD REMOVAL/EXTRACTION;  Surgeon: Evans Lance, MD;  Location: [place];  Service: Cardiovascular;  Laterality: Left;  PATIENT HAS LVAD  DR. VAN TRIGT TO BACK UP EXTRACTION  . Appendectomy    . Enteroscopy N/A 11/09/2015    Procedure: ENTEROSCOPY;  Surgeon: Mauri Pole, MD;  Location: [facility] END[facility];  Service: Endoscopy;  Laterality: N/A;  LVAD  . Givens capsule study N/A 12/11/2015    Procedure: GIVENS CAPSULE STUDY;  Surgeon: Gatha Mayer, MD;  Location: [place];  Service: Endoscopy;  Laterality: N/A;  . Enteroscopy N/A 12/13/2015    Procedure: ENTEROSCOPY;  Surgeon: Milus Banister, MD;  Location: [place];  Service: Endoscopy;  Laterality: N/A;  . Enteroscopy N/A 02/21/2016    Procedure: ENTEROSCOPY;  Surgeon: Milus Banister, MD;  Location: [place];  Service: Endoscopy;  Laterality: N/A;   Family History  Problem Relation Age of Onset  . Heart attack Mother   . Heart attack Father    Social History  Substance Use Topics  . Smoking status: Former Smoker -- 2.00 packs/day for 45 years    Types: Cigarettes    Quit date: 11/11/2001  . Smokeless tobacco: Never Used  . Alcohol Use: Yes     Comment: 05/24/2013 "use to drink beer; hardly nothing since 2003; nothing at all since 12/2011 when I got my LVAD  "    Review of Systems  A 10 point review of systems was completed and was negative except for pertinent positives and negatives as mentioned in the history of present illness    Allergies  Tape  Home Medications   Prior to Admission  medications   Medication Sig Start Date End Date Taking? Authorizing Provider  albuterol (PROVENTIL) (2.5 MG/3ML) 0.083% nebulizer solution Take 2.5 mg by nebulization every 6 (six) hours as needed for wheezing or shortness of breath.    Historical Provider, MD  amiodarone (PACERONE) 200 MG tablet Take 1 tablet (200 mg total) by mouth every evening. 03/29/16   Larey Dresser, MD  apixaban (ELIQUIS) 5 MG TABS tablet Take 1 tablet (5 mg total) by mouth 2 (two) times daily. 12/25/15   Jolaine Artist, MD  budesonide-formoterol (SYMBICORT) 160-4.5 MCG/ACT inhaler Inhale 2 puffs into the lungs 2 (two) times daily. 01/10/15   Jolaine Artist, MD  cholecalciferol (VITAMIN D) 1000 UNITS tablet Take 1 tablet (1,000 Units total) by mouth 2 (two) times daily. 01/10/15   Jolaine Artist, MD  citalopram (CELEXA) 40 MG tablet Take 1  tablet (40 mg total) by mouth at bedtime. 01/10/15   Jolaine Artist, MD  docusate sodium (COLACE) 100 MG capsule Take 100 mg by mouth 2 (two) times daily.     Historical Provider, MD  furosemide (LASIX) 40 MG tablet Use only if a weight gain of 5 pounds in a week occurs or evident swelling is present 03/29/16   Larey Dresser, MD  hydrocortisone (ANUSOL-HC) 2.5 % rectal cream Place 1 application rectally 2 (two) times daily. 11/21/15   Larey Dresser, MD  levothyroxine (SYNTHROID, LEVOTHROID) 100 MCG tablet Take 1 tablet (100 mcg total) by mouth at bedtime. 02/23/16   Amy D Clegg, NP  losartan (COZAAR) 25 MG tablet Take 25 mg by mouth at bedtime.    Historical Provider, MD  Multiple Vitamin (MULTIVITAMIN WITH MINERALS) TABS tablet Take 1 tablet by mouth daily.    Historical Provider, MD  nitroGLYCERIN (NITROSTAT) 0.4 MG SL tablet Place 0.4 mg under the tongue every 5 (five) minutes as needed for chest pain. Reported on 04/09/2016    Historical Provider, MD  pantoprazole (PROTONIX) 40 MG tablet Take 1 tablet (40 mg total) by mouth 2 (two) times daily. 01/10/15   Jolaine Artist,  MD  potassium chloride SA (K-DUR,KLOR-CON) 20 MEQ tablet Take 1 tablet (20 mEq total) by mouth daily. And take extra 1 pill with lasix when needed. 03/29/16   Larey Dresser, MD  PRESCRIPTION MEDICATION Ferumoxytol [facility]) 510 mg in sodium chloride 0.9 % 100 mL IVPB (Once) Route: Intravenous    Historical Provider, MD  PRESCRIPTION MEDICATION Octreotide (SANDOSTATIN LAR) IM injection 20 mg (Once)Route: Intramuscular    Historical Provider, MD  simvastatin (ZOCOR) 80 MG tablet Take 0.5 tablets (40 mg total) by mouth at bedtime. 01/10/15   Jolaine Artist, MD  tiotropium (SPIRIVA) 18 MCG inhalation capsule Place 1 capsule (18 mcg total) into inhaler and inhale daily. 01/10/15   Jolaine Artist, MD  ursodiol (ACTIGALL) 300 MG capsule Take 1 capsule (300 mg total) by mouth 2 (two) times daily. 03/29/16   Larey Dresser, MD  vitamin B-12 (CYANOCOBALAMIN) 1000 MCG tablet Take 1,000 mcg by mouth at bedtime.    Historical Provider, MD  vitamin C (ASCORBIC ACID) 500 MG tablet Take 1 tablet (500 mg total) by mouth 2 (two) times daily. 01/10/15   Shaune Pascal Bensimhon, MD   BP 90/74 mmHg  Pulse 74  Temp(Src) 97.8 F (36.6 C) (Oral)  Resp 18  Wt 97.722 kg  SpO2 100% Physical Exam  Constitutional: He is oriented to person, place, and time. He appears well-developed and well-nourished.  HENT:  Head: Normocephalic and atraumatic.  Mouth/Throat: Oropharynx is clear and moist.  Eyes: Conjunctivae are normal. Pupils are equal, round, and reactive to light. Right eye exhibits no discharge. Left eye exhibits no discharge. No scleral icterus.  Neck: Neck supple.  Cardiovascular:  LVAD  Pulmonary/Chest: Effort normal and breath sounds normal. No respiratory distress. He has no wheezes. He has no rales.  Abdominal: Soft. There is no tenderness.  Apparent ruptured biliary drainage bag. No abdominal pain or other evidence of infection.  Musculoskeletal: He exhibits no tenderness.  Neurological: He is alert  and oriented to person, place, and time.  Cranial Nerves II-XII grossly intact  Skin: Skin is warm and dry. No rash noted.  Psychiatric: He has a normal mood and affect.  Nursing note and vitals reviewed.   ED Course  Procedures (including critical care time) Labs Review Labs Reviewed -  No data to display  Imaging Review No results found. I have personally reviewed and evaluated these images and lab results as part of my medical decision-making.   EKG Interpretation None      MDM  Presents for replacement of biliary drainage bag. Discussed with IR, Dr. Pascal Lux, we will attempt replacement in ED. Successfully changed at bedside. Discussed follow-up with his surgeon in the next 2 days for reevaluation. Strict return precautions discussed. He verbalizes understanding, expresses no other questions or concerns at this time. Appears very well and appropriate for discharge. Final diagnoses:  Encounter for biliary drainage tube placement       Comer Locket, PA-C 04/13/16 0046  Comer Locket, PA-C 04/13/16 [id number]  Milton Ferguson, MD 04/13/16 1458

## 2016-04-12 NOTE — ED Notes (Addendum)
Patient states that he is just here for replacement bag for his galbladder drainage. PA-C made aware.

## 2016-04-12 NOTE — Discharge Instructions (Signed)
Bulb Drain Home Care A bulb drain consists of a thin rubber tube and a soft, round bulb that creates a gentle suction. The rubber tube is placed in the area where you had surgery. A bulb is attached to the end of the tube that is outside the body. The bulb drain removes excess fluid that normally builds up in a surgical wound after surgery. The color and amount of fluid will vary. Immediately after surgery, the fluid is bright red and is a little thicker than water. It may gradually change to a yellow or pink color and become more thin and water-like. When the amount decreases to about 1 or 2 tbsp in 24 hours, your health care provider will usually remove it. DAILY CARE  Keep the bulb flat (compressed) at all times, except while emptying it. The flatness creates suction. You can flatten the bulb by squeezing it firmly in the middle and then closing the cap.  Keep sites where the tube enters the skin dry and covered with a bandage (dressing).  Secure the tube 1-2 in (2.5-5.1 cm) below the insertion sites to keep it from pulling on your stitches. The tube is stitched in place and will not slip out.  Secure the bulb as directed by your health care provider.  For the first 3 days after surgery, there usually is more fluid in the bulb. Empty the bulb whenever it becomes half full because the bulb does not create enough suction if it is too full. The bulb could also overflow. Write down how much fluid you remove each time you empty your drain. Add up the amount removed in 24 hours.  Empty the bulb at the same time every day once the amount of fluid decreases and you only need to empty it once a day. Write down the amounts and the 24-hour totals to give to your health care provider. This helps your health care provider know when the tubes can be removed. EMPTYING THE BULB DRAIN Before emptying the bulb, get a measuring cup, a piece of paper and a pen, and wash your hands.  Gently run your fingers down the  tube (stripping) to empty any drainage from the tubing into the bulb. This may need to be done several times a day to clear the tubing of clots and tissue.  Open the bulb cap to release suction, which causes it to inflate. Do not touch the inside of the cap.  Gently run your fingers down the tube (stripping) to empty any drainage from the tubing into the bulb.  Hold the cap out of the way, and pour fluid into the measuring cup.   Squeeze the bulb to provide suction.  Replace the cap.   Check the tape that holds the tube to your skin. If it is becoming loose, you can remove the loose piece of tape and apply a new one. Then, pin the bulb to your shirt.   Write down the amount of fluid you emptied out. Write down the date and each time you emptied your bulb drain. (If there are 2 bulbs, note the amount of drainage from each bulb and keep the totals separate. Your health care provider will want to know the total amounts for each drain and which tube is draining more.)   Flush the fluid down the toilet and wash your hands.   Call your health care provider once you have less than 2 tbsp of fluid collecting in the bulb drain every 24 hours. If  there is drainage around the tube site, change dressings and keep the area dry. Cleanse around tube with sterile saline and place dry gauze around site. This gauze should be changed when it is soiled. If it stays clean and unsoiled, it should still be changed daily.  SEEK MEDICAL CARE IF:  Your drainage has a bad smell or is cloudy.   You have a fever.   Your drainage is increasing instead of decreasing.   Your tube fell out.   You have redness or swelling around the tube site.   You have drainage from a surgical wound.   Your bulb drain will not stay flat after you empty it.  MAKE SURE YOU:   Understand these instructions.  Will watch your condition.  Will get help right away if you are not doing well or get worse.   This  information is not intended to replace advice given to you by your health care provider. Make sure you discuss any questions you have with your health care provider.   Document Released: 09/13/2000 Document Revised: 10/07/2014 Document Reviewed: 04/05/2015 Elsevier Interactive Patient Education [employer].

## 2016-04-12 NOTE — ED Notes (Signed)
Patient verbalized understanding of discharge instructions and denies any further needs or questions at this time. VS stable. Patient ambulatory with steady gait, RN escorted to ED entrance.

## 2016-04-17 ENCOUNTER — Telehealth: Payer: Self-pay | Admitting: Hematology

## 2016-04-17 ENCOUNTER — Other Ambulatory Visit: Payer: Self-pay | Admitting: *Deleted

## 2016-04-17 ENCOUNTER — Encounter: Payer: Self-pay | Admitting: Hematology

## 2016-04-17 ENCOUNTER — Ambulatory Visit: Payer: Medicare Other

## 2016-04-17 ENCOUNTER — Telehealth: Payer: Self-pay | Admitting: Licensed Clinical Social Worker

## 2016-04-17 NOTE — Telephone Encounter (Signed)
left msg confirming apt change from 7/21 to 7/28

## 2016-04-17 NOTE — Telephone Encounter (Signed)
CSW contacted patient's wife to follow up as they have missed a few support group meetings. Wife stated that all is well although patient still recovering from "gall bladder issues". Wife spoke at length about life and ongoing medical issues. CSW offered support and encouragement. Wife appears to be coping well under stressful conditions. CSW will continue to be available as needed. Raquel Sarna, LCSW [phone]

## 2016-04-17 NOTE — Progress Notes (Signed)
Still no approval of visits from [place]- I sent message to dr.feng/nurse-Thu and let Darlena and Ebony know still nothing recd. Wife left mess she called and was told about 7 days. Ebony faxed form 04/12/16 and Any advised 04/16/16 they had form and needed to speak with wife, so called and left mess for wife- [name] to call them. She did and was told of 7-10days approx for decision. Mess to Dr. Burr Medico is to reschedule his appt for Friday since no auth.

## 2016-04-18 ENCOUNTER — Encounter: Payer: Self-pay | Admitting: Hematology

## 2016-04-18 ENCOUNTER — Telehealth: Payer: Self-pay | Admitting: *Deleted

## 2016-04-18 NOTE — Progress Notes (Signed)
S/w Jannie- [id number]- [facility] choice and patient's visits are approved 04/23/16-06/29/16. She will fax the packet/approval

## 2016-04-18 NOTE — Telephone Encounter (Signed)
Spoke with wife [name] and informed her of pt's reschedule [place] to Fri  04/26/16.  Explained to [name] that reason was that [place] has not approved the visit yet, and that Raquel, care management has been working on the approval for pt.  [name] voiced understanding and thanked nurse for notifying her.

## 2016-04-19 ENCOUNTER — Encounter: Payer: Self-pay | Admitting: Hematology

## 2016-04-19 ENCOUNTER — Ambulatory Visit: Payer: Medicare Other

## 2016-04-19 NOTE — Progress Notes (Signed)
Recd packet with [id number]  04/23/16-06/29/16  12 visits. Faxed them medical recds per instructions

## 2016-04-23 ENCOUNTER — Other Ambulatory Visit (HOSPITAL_BASED_OUTPATIENT_CLINIC_OR_DEPARTMENT_OTHER): Payer: No Typology Code available for payment source

## 2016-04-23 ENCOUNTER — Other Ambulatory Visit (HOSPITAL_COMMUNITY): Payer: Self-pay | Admitting: Infectious Diseases

## 2016-04-23 ENCOUNTER — Other Ambulatory Visit (HOSPITAL_COMMUNITY): Payer: Self-pay | Admitting: *Deleted

## 2016-04-23 ENCOUNTER — Other Ambulatory Visit: Payer: Self-pay

## 2016-04-23 ENCOUNTER — Ambulatory Visit (HOSPITAL_COMMUNITY)
Admission: RE | Admit: 2016-04-23 | Discharge: 2016-04-23 | Disposition: A | Discharge status: Home / Self Care | Payer: Medicare Other | Source: Ambulatory Visit | Attending: Cardiology | Admitting: Cardiology

## 2016-04-23 VITALS — BP 104/0 | HR 75 | Ht 73.0 in | Wt 210.6 lb

## 2016-04-23 DIAGNOSIS — I480 Paroxysmal atrial fibrillation: Secondary | ICD-10-CM | POA: Insufficient documentation

## 2016-04-23 DIAGNOSIS — D500 Iron deficiency anemia secondary to blood loss (chronic): Secondary | ICD-10-CM

## 2016-04-23 DIAGNOSIS — D649 Anemia, unspecified: Secondary | ICD-10-CM

## 2016-04-23 DIAGNOSIS — G4733 Obstructive sleep apnea (adult) (pediatric): Secondary | ICD-10-CM | POA: Insufficient documentation

## 2016-04-23 DIAGNOSIS — Z95810 Presence of automatic (implantable) cardiac defibrillator: Secondary | ICD-10-CM | POA: Insufficient documentation

## 2016-04-23 DIAGNOSIS — I5022 Chronic systolic (congestive) heart failure: Secondary | ICD-10-CM | POA: Diagnosis present

## 2016-04-23 DIAGNOSIS — Z95811 Presence of heart assist device: Secondary | ICD-10-CM

## 2016-04-23 DIAGNOSIS — Z9981 Dependence on supplemental oxygen: Secondary | ICD-10-CM | POA: Insufficient documentation

## 2016-04-23 DIAGNOSIS — R040 Epistaxis: Secondary | ICD-10-CM | POA: Diagnosis not present

## 2016-04-23 DIAGNOSIS — Z7901 Long term (current) use of anticoagulants: Secondary | ICD-10-CM | POA: Diagnosis not present

## 2016-04-23 DIAGNOSIS — I472 Ventricular tachycardia: Secondary | ICD-10-CM | POA: Insufficient documentation

## 2016-04-23 DIAGNOSIS — F329 Major depressive disorder, single episode, unspecified: Secondary | ICD-10-CM | POA: Insufficient documentation

## 2016-04-23 DIAGNOSIS — I255 Ischemic cardiomyopathy: Secondary | ICD-10-CM | POA: Insufficient documentation

## 2016-04-23 DIAGNOSIS — I2510 Atherosclerotic heart disease of native coronary artery without angina pectoris: Secondary | ICD-10-CM | POA: Insufficient documentation

## 2016-04-23 DIAGNOSIS — J449 Chronic obstructive pulmonary disease, unspecified: Secondary | ICD-10-CM | POA: Insufficient documentation

## 2016-04-23 DIAGNOSIS — I110 Hypertensive heart disease with heart failure: Secondary | ICD-10-CM | POA: Insufficient documentation

## 2016-04-23 DIAGNOSIS — E785 Hyperlipidemia, unspecified: Secondary | ICD-10-CM | POA: Insufficient documentation

## 2016-04-23 DIAGNOSIS — I484 Atypical atrial flutter: Secondary | ICD-10-CM

## 2016-04-23 DIAGNOSIS — E039 Hypothyroidism, unspecified: Secondary | ICD-10-CM | POA: Diagnosis not present

## 2016-04-23 DIAGNOSIS — K8010 Calculus of gallbladder with chronic cholecystitis without obstruction: Secondary | ICD-10-CM | POA: Insufficient documentation

## 2016-04-23 DIAGNOSIS — K811 Chronic cholecystitis: Secondary | ICD-10-CM | POA: Diagnosis not present

## 2016-04-23 DIAGNOSIS — E669 Obesity, unspecified: Secondary | ICD-10-CM | POA: Diagnosis not present

## 2016-04-23 DIAGNOSIS — Z951 Presence of aortocoronary bypass graft: Secondary | ICD-10-CM | POA: Insufficient documentation

## 2016-04-23 DIAGNOSIS — D509 Iron deficiency anemia, unspecified: Secondary | ICD-10-CM

## 2016-04-23 LAB — CBC & DIFF AND RETIC
BASO%: 0.2 % (ref 0.0–2.0)
BASOS ABS: 0 10*3/uL (ref 0.0–0.1)
EOS ABS: 0.1 10*3/uL (ref 0.0–0.5)
EOS%: 2.9 % (ref 0.0–7.0)
HEMATOCRIT: 33.2 % — AB (ref 38.4–49.9)
HEMOGLOBIN: 10 g/dL — AB (ref 13.0–17.1)
IMMATURE RETIC FRACT: 21.7 % — AB (ref 3.00–10.60)
LYMPH#: 0.5 10*3/uL — AB (ref 0.9–3.3)
LYMPH%: 10.1 % — AB (ref 14.0–49.0)
MCH: 29 pg (ref 27.2–33.4)
MCHC: 30.1 g/dL — ABNORMAL LOW (ref 32.0–36.0)
MCV: 96.2 fL (ref 79.3–98.0)
MONO#: 0.3 10*3/uL (ref 0.1–0.9)
MONO%: 7.4 % (ref 0.0–14.0)
NEUT#: 3.5 10*3/uL (ref 1.5–6.5)
NEUT%: 79.4 % — AB (ref 39.0–75.0)
PLATELETS: 136 10*3/uL — AB (ref 140–400)
RBC: 3.45 10*6/uL — AB (ref 4.20–5.82)
RDW: 18.3 % — ABNORMAL HIGH (ref 11.0–14.6)
RETIC CT ABS: 146.63 10*3/uL — AB (ref 34.80–93.90)
Retic %: 4.25 % — ABNORMAL HIGH (ref 0.80–1.80)
WBC: 4.5 10*3/uL (ref 4.0–10.3)

## 2016-04-23 LAB — FERRITIN: Ferritin: 78 ng/ml (ref 22–316)

## 2016-04-23 NOTE — Progress Notes (Signed)
Patient ID: [name], male   DOB: [birth year], 75 y.o.   MRN: [id number]   VAD CLINIC NOTE   [facility] [place] (Dr. 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 Quit smoking 2003. Uses CPAP and O2 every night. He is s/p LVAD HM II implanted 01/12/13 under DT criteria  Admitted to [facility] 09/13/13 - 09/14/13 for increased fatigue and dyspnea. Found to have mild CHF and anemia. Diuresed with IV lasix and transitioned to PO lasix 40 mg twice a week. Discharge weight 203 lbs on inpatient scale.  Admitting labs revealed Hgb 7.7 for which he received 2 units PCs with appropriate rise in Hgb.   Admit 6/8-6/12 for symptomatic anemia. Had EGD/Colonoscopy which was unrevealing.  6 mm polyp clipped and removed. CT abdomen showed cirrhosis. Placed on heparin bridge. Capsule study pending. Sent home on lovenox. D/C weight 206 lbs.   Admitted to [facility] with with GI bleed. GI consulted. Push enteroscopy 07/27/13 negative. Colonoscopy 10/29 without source of bleeding.Result of capsule endoscopy--> Showed bleeding from duodenum. EGD--->Bleeding from duodenum with clip applied. Overall he received  10 UPRBCs. Discharge weight was 207 pounds.   Admitted to [facility] 09/21/14 with GI bleed . GI consulted. Enteroscopy showed bleed from jejunum requiring 3 clips. Overall he received 4 units PRBCs.  He went back in A fib and had DC-CV.  On the day discharge he was back in NSR on amiodarone 200 mg twice a day. Discharge weight was 214 pounds.   Admitted in 6/16 for ICD extraction due to pocket infection. Did well. No complications.   Admitted 2/17 with BRPBR.  Ultimately, thought to be due to epistaxis + hemorrhoidal bleeding but Hgb continued to trend down so underwent GI eval.  Enteroscopy was normal.     Admitted  12/08/2015 with symptomatic anemia. GI consulted and performed  Capsule 12/11/15 2 proximal small bowel AVMs, 2 nonbleeding polyps which were removed. Received 3UPRBCs. He had recurrent A fib but chemically converted with amio drip. Later transitioned to amio 400 mg bid. Losartan was held due to low MAP and dizziness. Coumadin switched to apixaban.   Admitted 5/17 with upper GI bleeding, 6 units PRBCs, EGD with AVM clipping.   Admitted later in 5/17 with acute cholecystitis.  He had a cholecystostomy tube placed.  Saw surgery recently, plan for ongoing conservative treatment, may remove tube in August.   [facility] f/u:  Today he return for follow up. Overall feeling ok. Denies SOB/PND/Orthopnea. No recent falls. Cholecystostomy continues to drain 200-300 cc per day. No abdominal pain. Say he had to take lasix for 3 days in a row. Weight went up to 217 pounds but now back down to 210 pounds. He continues to wear 30 day event monitor.  No dizziness. Denies  BRBPR or melena.  He has started IM octreotide.  Today he is in slow atrial flutter again.    LVAD interrogation  See nurse's note above.   Labs (2/16): LDL 305, HCT 38.9 Labs (3/16): K 3.9, creatinine 1.05, LFTs normal Labs (2/17): WBCs 11.2, INR 2.68.  Labs (6/17): K 3.3, creatinine 0.94, HCT 35.1, INR 2.3  ECG: Atypical atrial flutter, rate 62  Past Medical History:  Diagnosis Date  . Anemia   . Asbestosis(501)    "6 years in the [place]" (05/24/2013)  .  Atrial fibrillation ([place])    permanent  . Cancer ([place])    "scraped some off behind my left ear; fast moving; having it cut out 12/05/2015" (11/07/2015)  . CHF (congestive heart failure) ([place])   . Chronic anticoagulation    Afib and LVAD  . Chronic systolic heart failure ([place])   . Complication of anesthesia    "they can't put me all the way to sleep cause of my heart" (11/07/2015)  . COPD (chronic obstructive pulmonary disease) ([place])   . Coronary artery disease   . Depression   .  Epistaxis 11/2015, 07/2014  . History of blood transfusion 08/2015 X 10; 11/2015 X 2   "related to bleeding on the inside somewhere" (11/07/2015)  . Hyperlipidemia   . Hypertension   . Hypothyroidism   . Ischemic cardiomyopathy     CABG 2003, PCI 2007  EF 27%(myoview 2012  . LVAD (left ventricular assist device) present ([place]) 12/2012  . Myocardial infarction ([place]) 1990's-2000   "2 in  ~ the 1990's; 1 in ~ 2000" (11/07/2015)  . Obesity   . On home oxygen therapy    "have it available; don't use it" (11/07/2015)  . OSA on CPAP   . Paroxysmal ventricular tachycardia ([facility])     Current Outpatient Prescriptions  Medication Sig Dispense Refill  . albuterol (PROVENTIL) (2.5 MG/3ML) 0.083% nebulizer solution Take 2.5 mg by nebulization every 6 (six) hours as needed for wheezing or shortness of breath.    [name] amiodarone (PACERONE) 200 MG tablet Take 1 tablet (200 mg total) by mouth every evening. 90 tablet 3  . apixaban (ELIQUIS) 5 MG TABS tablet Take 1 tablet (5 mg total) by mouth 2 (two) times daily. 60 tablet 11  . budesonide-formoterol (SYMBICORT) 160-4.5 MCG/ACT inhaler Inhale 2 puffs into the lungs 2 (two) times daily. 1 Inhaler 3  . cholecalciferol (VITAMIN D) 1000 UNITS tablet Take 1 tablet (1,000 Units total) by mouth 2 (two) times daily. 180 tablet 3  . citalopram (CELEXA) 40 MG tablet Take 1 tablet (40 mg total) by mouth at bedtime. 90 tablet 3  . furosemide (LASIX) 40 MG tablet Use only if a weight gain of 5 pounds in a week occurs or evident swelling is present 30 tablet 4  . hydrocortisone (ANUSOL-HC) 2.5 % rectal cream Place 1 application rectally 2 (two) times daily. 30 g 6  . levothyroxine (SYNTHROID, LEVOTHROID) 100 MCG tablet Take 1 tablet (100 mcg total) by mouth at bedtime. 30 tablet 6  . losartan (COZAAR) 25 MG tablet Take 25 mg by mouth at bedtime.    . Multiple Vitamin (MULTIVITAMIN WITH MINERALS) TABS tablet Take 1 tablet by mouth daily.    . potassium chloride SA (K-DUR,KLOR-CON)  20 MEQ tablet Take 1 tablet (20 mEq total) by mouth daily. And take extra 1 pill with lasix when needed. 45 tablet 4  . simvastatin (ZOCOR) 80 MG tablet Take 0.5 tablets (40 mg total) by mouth at bedtime. 45 tablet 3  . tiotropium (SPIRIVA) 18 MCG inhalation capsule Place 1 capsule (18 mcg total) into inhaler and inhale daily. 90 capsule 3  . ursodiol (ACTIGALL) 300 MG capsule Take 1 capsule (300 mg total) by mouth 2 (two) times daily. 180 capsule 2  . vitamin B-12 (CYANOCOBALAMIN) 1000 MCG tablet Take 1,000 mcg by mouth at bedtime.    . vitamin C (ASCORBIC ACID) 500 MG tablet Take 1 tablet (500 mg total) by mouth 2 (two) times daily. 180 tablet 3  . docusate sodium (  COLACE) 100 MG capsule Take 100 mg by mouth 2 (two) times daily.     . nitroGLYCERIN (NITROSTAT) 0.4 MG SL tablet Place 0.4 mg under the tongue every 5 (five) minutes as needed for chest pain. Reported on 04/09/2016    . pantoprazole (PROTONIX) 40 MG tablet Take 1 tablet (40 mg total) by mouth 2 (two) times daily. (Patient not taking: Reported on 04/23/2016) 180 tablet 3  . PRESCRIPTION MEDICATION Ferumoxytol (FERAHEME) 510 mg in sodium chloride 0.9 % 100 mL IVPB (Once) Route: Intravenous    . PRESCRIPTION MEDICATION Octreotide (SANDOSTATIN LAR) IM injection 20 mg (Once)Route: Intramuscular     No current facility-administered medications for this encounter.     Tape  REVIEW OF SYSTEMS: All systems negative except as listed in HPI, PMH and Problem list.  Vital signs: HR: 62 Doppler modified systolic:104 Auto cuff:  123XX123 (83) O2 Sat: 95% Wt:  210.6 lbs  Last weight:  212.8 Ht: 6'1"  GENERAL:Well appearing, male; NAD; wife present. Ambulated in the clinic without difficulty.  HEENT: normal  NECK: Supple, JVP 5-6 no bruits.  No lymphadenopathy or thyromegaly appreciated.   CARDIAC: Mechanical heart sounds with LVAD hum present.  LUNGS:  Clear to auscultation bilaterally.  ABDOMEN:  Obese, Soft, round, nontender, positive  bowel sounds x4.     LVAD exit site: well-healed and incorporated.  Dressing dry and intact.  No erythema, drainage, odor or tenderness.  Stabilization device present and accurately applied.  Driveline dressing is being changed daily per sterile technique. Changed in clinic EXTREMITIES:  Warm and dry, no cyanosis, clubbing, no edema NEUROLOGIC:  Alert and oriented x 4.  Gait steady.  No aphasia.  No dysarthria.  Affect pleasant.    ASSESSMENT AND PLAN:  1. Chronic Systolic HF: s/p LVAD implant 12/2012 for DT.  NYHA II-III.  Volume status stable.  Not on BB due to previous bradycardia.  - Continue diuretics as needed.  - Reinforced the need and importance of daily weights, a low sodium diet, and fluid restriction (less than 2 L a day). Instructed to call the HF clinic if weight increases more than 3 lbs overnight or 5 lbs in a week.  2. Anticoagulation management: On apixaban so no INR. Marland KitchenHe is not on ASA because of bleeding. He is now getting monthly IM octreotide. Next dose will be 8.8.2017. Followed by Hematology.  3. Anemia/ GIB:  Now on apixaban due to recurrent GI bleeding with normal endoscopy. Hgb stable today. 10.0 Plan for feraheme next week.   - Continue monthly octreotide.    4. LVAD: HM II.       - Had CBC earlier today. Check CBC and LDH next visit.    5. PAF: In A flutter today. Completing 30 day event monitors. Continue amio 200 mg daily. May need to stop amio if he is in A flutter most of the time. Follow LFTs and TSH.  Should have yearly eye exam.    6. Depression: Stable.  Continue Celexa 40 mg daily 7. OSA: Using CPAP.  8. Hypoxemia:  Uses home oxygen at night.  This may be due to his baseline COPD. 9. ICD infection - s/p extraction 6/16.  10. Dizzy/Weak spells with falls: No recent falls.  11. Atrial flutter: Atypical flutter on ECG as above. Completing 30 day event monitor.    12. Chronic cholecystitis: He has a cholecystostomy tube in place, no abdominal pain. Seen recently  by surgery, continue to follow conservatively for now. ?Removal of  tube in August if he remains symptomatic but still with 200-300 cc per day. If needs cholecystectomy will coordinate with LVAD team and we would be the admitting service.   Follow up in 4 weeks.    Floella Ensz NP-C 04/23/2016

## 2016-04-24 NOTE — Addendum Note (Signed)
Encounter addended by: Lezlie Octave, RN on: 04/24/2016  2:02 PM<BR>    Actions taken: Home Medications modified, Medication taking status modified, Order Reconciliation Section accessed, Sign clinical note

## 2016-04-24 NOTE — Progress Notes (Signed)
Symptom  Yes  No  Details   Angina         x Activity:   Claudication                x How far:   Syncope         x When:   Stroke         x   Orthopnea         x How many pillows:   2 for comfort  PND         x How often:  CPAP         x  How many hrs:  All night; no O2  Pedal edema         x   Abd fullness         x   N&V         x   Diaphoresis                x When:   Bleeding               x   Urine color    medium yellow  SOB               x Activity:    Palpitations         x When:  ICD shock         N/A   Hospitlizaitons               x When/where/why:    ED visit               x When/where/why:   Other MD              x When/who/why:    Activity    No formal activity; sedentary  Fluid    No limitations  Diet    No limitations   Vital signs: HR: 62 Doppler modified systolic:  123456 Auto cuff:  103/67 (83) O2 Sat: 95% Wt:  210.9 lbs  Last weight:  212.8 lbs Ht: 6'1"  LVAD interrogation reveals (reviewed with Dr Aundra Dubin):  Speed: 9200 Flow:  4.8 Power: 5.2w PI:  6.4 Alarms: none Events: 0 - 5 PI event       Fixed speed:  9200 Low speed limit: 8600 Primary Controller:  Replace back up battery in 26 months  Back up controller:   Replace back up battery in 26 months   LVAD exit site:  Well healed and incorporated. The velour is fully implanted at exit site. Dressing dry and intact. No erythema or drainage. Stabilization device present and accurately applied. Driveline dressing is being changed weekly per sterile technique using Sorbaview dressing without biopatch (causes skin irritation) exit site. Pt denies fever or chills. Dressing supplies provided to patient.   Encounter Details: Presents to clinic today with his wife for three week f/u appointment. He arrives ambulatory today. Pt has 30 day event monitor on as ordered per Dr. Aundra Dubin.  EKG today reveals atrial flutter with rate control; pt currently taking Amiodarone 200 mg daily.   GB drain C/D/I. Additional  drainage bag obtained and given to patient as back up. Plans to see Dr. Dalbert Batman on 05/02/16 with labs 04/30/16 (requested by [name] to be done here and faxed to [place] office).  FU with Dr. Burr Medico for continuous Iron infusions.   He is currently on Eliquis 5 mg BID. Denies any dark or bloody stools.  LDH not done this visit.  Established baseline 180 - 250. No power elevations or tea-colored urine.   Currently taking ursodiol 300 mg BID per Dr. Aundra Dubin to dissolve gall stones. Pt reports the drainage now contains sediment.     Patient Instructions: 1.  No change in medications. 2.  Return to [place] clinic in one month.  Zada Girt, RN VAD Coordinator  Office: [phone] 24/7 VAD Pager: [phone]

## 2016-04-24 NOTE — Addendum Note (Signed)
Encounter addended by: Lezlie Octave, RN on: 04/24/2016 10:22 AM<BR>    Actions taken: Pend clinical note, Delete clinical note

## 2016-04-26 ENCOUNTER — Ambulatory Visit (HOSPITAL_BASED_OUTPATIENT_CLINIC_OR_DEPARTMENT_OTHER): Payer: No Typology Code available for payment source

## 2016-04-26 VITALS — BP 95/81 | HR 70 | Temp 97.8°F | Resp 16

## 2016-04-26 DIAGNOSIS — K922 Gastrointestinal hemorrhage, unspecified: Secondary | ICD-10-CM

## 2016-04-26 DIAGNOSIS — D500 Iron deficiency anemia secondary to blood loss (chronic): Secondary | ICD-10-CM | POA: Diagnosis not present

## 2016-04-26 DIAGNOSIS — D62 Acute posthemorrhagic anemia: Secondary | ICD-10-CM

## 2016-04-26 MED ORDER — SODIUM CHLORIDE 0.9 % IV SOLN
510.0000 mg | Freq: Once | INTRAVENOUS | Status: AC
  Administered 2016-04-26: 510 mg via INTRAVENOUS
  Filled 2016-04-26: qty 17

## 2016-04-29 ENCOUNTER — Telehealth: Payer: Self-pay | Admitting: *Deleted

## 2016-04-29 NOTE — Telephone Encounter (Signed)
"  Is Dr. Burr Medico going to schedule [place] this week for his second iron infusion.  He normally gets a second feraheme and the first was this past Friday.  Call us at [phone]."

## 2016-04-30 ENCOUNTER — Ambulatory Visit (HOSPITAL_COMMUNITY)
Admission: RE | Admit: 2016-04-30 | Discharge: 2016-04-30 | Disposition: A | Discharge status: Home / Self Care | Payer: No Typology Code available for payment source | Source: Ambulatory Visit | Attending: Cardiology | Admitting: Cardiology

## 2016-04-30 ENCOUNTER — Telehealth (HOSPITAL_COMMUNITY): Payer: Self-pay | Admitting: Infectious Diseases

## 2016-04-30 DIAGNOSIS — Z95811 Presence of heart assist device: Secondary | ICD-10-CM | POA: Diagnosis not present

## 2016-04-30 DIAGNOSIS — I5023 Acute on chronic systolic (congestive) heart failure: Secondary | ICD-10-CM

## 2016-04-30 DIAGNOSIS — D649 Anemia, unspecified: Secondary | ICD-10-CM | POA: Insufficient documentation

## 2016-04-30 DIAGNOSIS — E876 Hypokalemia: Secondary | ICD-10-CM

## 2016-04-30 LAB — CBC WITH DIFFERENTIAL/PLATELET
BASOS PCT: 0 %
Basophils Absolute: 0 10*3/uL (ref 0.0–0.1)
EOS ABS: 0.1 10*3/uL (ref 0.0–0.7)
EOS PCT: 2 %
HCT: 33.4 % — ABNORMAL LOW (ref 39.0–52.0)
HEMOGLOBIN: 9.7 g/dL — AB (ref 13.0–17.0)
Lymphocytes Relative: 7 %
Lymphs Abs: 0.3 10*3/uL — ABNORMAL LOW (ref 0.7–4.0)
MCH: 29 pg (ref 26.0–34.0)
MCHC: 29 g/dL — AB (ref 30.0–36.0)
MCV: 99.7 fL (ref 78.0–100.0)
Monocytes Absolute: 0.4 10*3/uL (ref 0.1–1.0)
Monocytes Relative: 8 %
NEUTROS PCT: 83 %
Neutro Abs: 3.9 10*3/uL (ref 1.7–7.7)
PLATELETS: 128 10*3/uL — AB (ref 150–400)
RBC: 3.35 MIL/uL — AB (ref 4.22–5.81)
RDW: 18 % — ABNORMAL HIGH (ref 11.5–15.5)
WBC: 4.7 10*3/uL (ref 4.0–10.5)

## 2016-04-30 LAB — COMPREHENSIVE METABOLIC PANEL
ALK PHOS: 75 U/L (ref 38–126)
ALT: 15 U/L — AB (ref 17–63)
AST: 28 U/L (ref 15–41)
Albumin: 2.9 g/dL — ABNORMAL LOW (ref 3.5–5.0)
Anion gap: 4 — ABNORMAL LOW (ref 5–15)
BILIRUBIN TOTAL: 0.4 mg/dL (ref 0.3–1.2)
BUN: 8 mg/dL (ref 6–20)
CALCIUM: 8.2 mg/dL — AB (ref 8.9–10.3)
CO2: 26 mmol/L (ref 22–32)
CREATININE: 1.03 mg/dL (ref 0.61–1.24)
Chloride: 114 mmol/L — ABNORMAL HIGH (ref 101–111)
Glucose, Bld: 119 mg/dL — ABNORMAL HIGH (ref 65–99)
Potassium: 2.6 mmol/L — CL (ref 3.5–5.1)
Sodium: 144 mmol/L (ref 135–145)
Total Protein: 5.2 g/dL — ABNORMAL LOW (ref 6.5–8.1)

## 2016-04-30 MED ORDER — POTASSIUM CHLORIDE CRYS ER 20 MEQ PO TBCR: 40.0000 meq | EXTENDED_RELEASE_TABLET | Freq: Every day | ORAL | 4 refills | Status: AC

## 2016-04-30 NOTE — Telephone Encounter (Signed)
Talked with pt's wife & informed that Dr Burr Medico ordered fereheme x 1 for now & will look at repeat labs for further fereheme.  She expressed understanding.

## 2016-04-30 NOTE — Telephone Encounter (Signed)
Critical lab result D/W Amy Clegg at 1200 with K 2.6. Orders received to have patient take 40 mEq BID x 2 days then to take 40 mEq daily.   Called to inform patient of the above instructions (he was not taking the Rx'd 20 mEq KCl daily and has taken lasix 3x in last 30 d). Also informed of other lab results and that they will be faxed to Dr. Darrel Hoover office for his appointment for the chole drain on 05/02/16. Verbalized understanding.

## 2016-05-06 ENCOUNTER — Other Ambulatory Visit: Payer: Self-pay | Admitting: General Surgery

## 2016-05-06 DIAGNOSIS — K810 Acute cholecystitis: Secondary | ICD-10-CM

## 2016-05-07 ENCOUNTER — Ambulatory Visit (HOSPITAL_BASED_OUTPATIENT_CLINIC_OR_DEPARTMENT_OTHER): Payer: No Typology Code available for payment source

## 2016-05-07 ENCOUNTER — Other Ambulatory Visit (HOSPITAL_BASED_OUTPATIENT_CLINIC_OR_DEPARTMENT_OTHER): Payer: No Typology Code available for payment source

## 2016-05-07 VITALS — BP 94/60 | HR 68 | Resp 18

## 2016-05-07 DIAGNOSIS — K598 Other specified functional intestinal disorders: Secondary | ICD-10-CM

## 2016-05-07 DIAGNOSIS — D62 Acute posthemorrhagic anemia: Secondary | ICD-10-CM

## 2016-05-07 DIAGNOSIS — D500 Iron deficiency anemia secondary to blood loss (chronic): Secondary | ICD-10-CM

## 2016-05-07 DIAGNOSIS — D649 Anemia, unspecified: Secondary | ICD-10-CM

## 2016-05-07 DIAGNOSIS — D509 Iron deficiency anemia, unspecified: Secondary | ICD-10-CM

## 2016-05-07 LAB — CBC & DIFF AND RETIC
BASO%: 0.2 % (ref 0.0–2.0)
BASOS ABS: 0 10*3/uL (ref 0.0–0.1)
EOS ABS: 0.1 10*3/uL (ref 0.0–0.5)
EOS%: 2.5 % (ref 0.0–7.0)
HEMATOCRIT: 33.7 % — AB (ref 38.4–49.9)
HGB: 10.1 g/dL — ABNORMAL LOW (ref 13.0–17.1)
IMMATURE RETIC FRACT: 16.9 % — AB (ref 3.00–10.60)
LYMPH#: 0.4 10*3/uL — AB (ref 0.9–3.3)
LYMPH%: 9.5 % — ABNORMAL LOW (ref 14.0–49.0)
MCH: 29.2 pg (ref 27.2–33.4)
MCHC: 30 g/dL — ABNORMAL LOW (ref 32.0–36.0)
MCV: 97.4 fL (ref 79.3–98.0)
MONO#: 0.3 10*3/uL (ref 0.1–0.9)
MONO%: 7 % (ref 0.0–14.0)
NEUT#: 3.6 10*3/uL (ref 1.5–6.5)
NEUT%: 80.8 % — AB (ref 39.0–75.0)
PLATELETS: 116 10*3/uL — AB (ref 140–400)
RBC: 3.46 10*6/uL — ABNORMAL LOW (ref 4.20–5.82)
RDW: 18.3 % — AB (ref 11.0–14.6)
RETIC %: 5.65 % — AB (ref 0.80–1.80)
RETIC CT ABS: 195.49 10*3/uL — AB (ref 34.80–93.90)
WBC: 4.4 10*3/uL (ref 4.0–10.3)

## 2016-05-07 LAB — IRON AND TIBC
%SAT: 12 % — AB (ref 20–55)
Iron: 29 ug/dL — ABNORMAL LOW (ref 42–163)
TIBC: 241 ug/dL (ref 202–409)
UIBC: 212 ug/dL (ref 117–376)

## 2016-05-07 LAB — FERRITIN: Ferritin: 195 ng/ml (ref 22–316)

## 2016-05-07 MED ORDER — OCTREOTIDE ACETATE 20 MG IM KIT
20.0000 mg | PACK | Freq: Once | INTRAMUSCULAR | Status: AC
  Administered 2016-05-07: 20 mg via INTRAMUSCULAR
  Filled 2016-05-07: qty 1

## 2016-05-07 NOTE — Patient Instructions (Signed)

## 2016-05-14 ENCOUNTER — Ambulatory Visit
Admission: RE | Admit: 2016-05-14 | Discharge: 2016-05-14 | Disposition: A | Discharge status: Home / Self Care | Payer: No Typology Code available for payment source | Source: Ambulatory Visit | Attending: General Surgery | Admitting: General Surgery

## 2016-05-14 DIAGNOSIS — K810 Acute cholecystitis: Secondary | ICD-10-CM

## 2016-05-14 HISTORY — PX: IR GENERIC HISTORICAL: IMG1180011

## 2016-05-14 NOTE — Progress Notes (Signed)
Patient ID: [name], male   DOB: 07/26/1941, 75 y.o.   MRN: [id number]       Chief Complaint: Patient was seen in consultation today for follow-up cholecystostomy tube injection/assessment  Referring Physician(s): Ingram,Haywood  Supervising Physician: Daryll Brod  History of Present Illness: [name] is a 75 y.o. male with multiple medical problems including ischemic cardiomyopathy with an LVAD who presented to [facility] in June of this year with acute cholecystitis. He was evaluated by surgery and deemed a poor candidate for cholecystectomy. He subsequently underwent a percutaneous cholecystostomy on 02/29/16. Procedure was performed without immediate complications and the patient was discharged home on 03/03/2016. Bile cultures grew Klebsiella. Patient has completed a course of Cipro. He presents today for follow-up gallbladder drain injection to assess patency of the cystic duct, biliary tree and to see if stones are still present. He is currently asymptomatic. He is currently not flushing the gallbladder drain. Most recent output has been about 75 mL of brown colored bile with a few small granular stone fragments in tubing.   Past Medical History:  Diagnosis Date  . Anemia   . Asbestosis(501)    "6 years in the [place]" (05/24/2013)  . Atrial fibrillation ([place])    permanent  . Cancer ([place])    "scraped some off behind my left ear; fast moving; having it cut out 12/05/2015" (11/07/2015)  . CHF (congestive heart failure) ([place])   . Chronic anticoagulation    Afib and LVAD  . Chronic systolic heart failure ([place])   . Complication of anesthesia    "they can't put me all the way to sleep cause of my heart" (11/07/2015)  . COPD (chronic obstructive pulmonary disease) ([place])   . Coronary artery disease   . Depression   . Epistaxis 11/2015, 07/2014  . History of blood transfusion 08/2015 X 10; 11/2015 X 2   "related to bleeding on the inside somewhere" (11/07/2015)  .  Hyperlipidemia   . Hypertension   . Hypothyroidism   . Ischemic cardiomyopathy     CABG 2003, PCI 2007  EF 27%(myoview 2012  . LVAD (left ventricular assist device) present ([place]) 12/2012  . Myocardial infarction ([place]) 1990's-2000   "2 in  ~ the 1990's; 1 in ~ 2000" (11/07/2015)  . Obesity   . On home oxygen therapy    "have it available; don't use it" (11/07/2015)  . OSA on CPAP   . Paroxysmal ventricular tachycardia ([place])     Past Surgical History:  Procedure Laterality Date  . APPENDECTOMY    . CARDIAC DEFIBRILLATOR PLACEMENT  2004; ~ 2010   "cut it out in 2016 after it got infected"   . COLONOSCOPY N/A 03/09/2014   Procedure: COLONOSCOPY;  Surgeon: Inda Castle, MD;  Location: [place];  Service: Endoscopy;  Laterality: N/A;  LVAD  patient  . COLONOSCOPY N/A 07/29/2014   Procedure: COLONOSCOPY;  Surgeon: Gatha Mayer, MD;  Location: [facility];  Service: Endoscopy;  Laterality: N/A;  . CORONARY ARTERY BYPASS GRAFT  2003   "?X4" (05/24/2013)  . ENTEROSCOPY N/A 07/27/2014   Procedure: ENTEROSCOPY;  Surgeon: Gatha Mayer, MD;  Location: [facility] ENDOSCOPY;  Service: Endoscopy;  Laterality: N/A;  LVAD patient   . ENTEROSCOPY N/A 09/27/2014   Procedure: ENTEROSCOPY;  Surgeon: Gatha Mayer, MD;  Location: [facility] ENDOSCOPY;  Service: Endoscopy;  Laterality: N/A;  . ENTEROSCOPY N/A 11/09/2015   Procedure: ENTEROSCOPY;  Surgeon: Mauri Pole, MD;  Location: [facility] ENDOSCOPY;  Service: Endoscopy;  Laterality: N/A;  LVAD  . ENTEROSCOPY N/A 12/13/2015   Procedure: ENTEROSCOPY;  Surgeon: Milus Banister, MD;  Location: [place];  Service: Endoscopy;  Laterality: N/A;  . ENTEROSCOPY N/A 02/21/2016   Procedure: ENTEROSCOPY;  Surgeon: Milus Banister, MD;  Location: [place];  Service: Endoscopy;  Laterality: N/A;  . ESOPHAGOGASTRODUODENOSCOPY N/A 03/09/2014   Procedure: ESOPHAGOGASTRODUODENOSCOPY (EGD);  Surgeon: Inda Castle, MD;  Location: [place];  Service: Endoscopy;   Laterality: N/A;  . ESOPHAGOGASTRODUODENOSCOPY N/A 08/01/2014   Procedure: ESOPHAGOGASTRODUODENOSCOPY (EGD);  Surgeon: Jerene Bears, MD;  Location: [facility] ENDOSCOPY;  Service: Endoscopy;  Laterality: N/A;  LVAD patient  . GIVENS CAPSULE STUDY N/A 03/10/2014   Procedure: GIVENS CAPSULE STUDY;  Surgeon: Inda Castle, MD;  Location: [place];  Service: Endoscopy;  Laterality: N/A;  . GIVENS CAPSULE STUDY N/A 07/30/2014   Procedure: GIVENS CAPSULE STUDY;  Surgeon: Gatha Mayer, MD;  Location: [place];  Service: Endoscopy;  Laterality: N/A;  . GIVENS CAPSULE STUDY N/A 12/11/2015   Procedure: GIVENS CAPSULE STUDY;  Surgeon: Gatha Mayer, MD;  Location: [place];  Service: Endoscopy;  Laterality: N/A;  . ICD LEAD REMOVAL Left 03/16/2015   Procedure: ICD LEAD REMOVAL/EXTRACTION;  Surgeon: Evans Lance, MD;  Location: [place];  Service: Cardiovascular;  Laterality: Left;  PATIENT HAS LVAD  DR. VAN TRIGT TO BACK UP EXTRACTION  . INSERTION OF IMPLANTABLE LEFT VENTRICULAR ASSIST DEVICE N/A 01/12/2013   Procedure: INSERTION OF IMPLANTABLE LEFT VENTRICULAR ASSIST DEVICE;  Surgeon: Ivin Poot, MD;  Location: [place];  Service: Open Heart Surgery;  Laterality: N/A;   nitric oxide; Redo sternotomy  . INTRAOPERATIVE TRANSESOPHAGEAL ECHOCARDIOGRAM N/A 01/12/2013   Procedure: INTRAOPERATIVE TRANSESOPHAGEAL ECHOCARDIOGRAM;  Surgeon: Ivin Poot, MD;  Location: [place];  Service: Open Heart Surgery;  Laterality: N/A;  . LEFT AND RIGHT HEART CATHETERIZATION WITH CORONARY/GRAFT ANGIOGRAM  01/07/2013   Procedure: LEFT AND RIGHT HEART CATHETERIZATION WITH Beatrix Fetters;  Surgeon: Jolaine Artist, MD;  Location: [facility] CATH LAB;  Service: Cardiovascular;;    Allergies: Tape  Medications: Prior to Admission medications   Medication Sig Start Date End Date Taking? Authorizing Provider  albuterol (PROVENTIL) (2.5 MG/3ML) 0.083% nebulizer solution Take 2.5 mg by nebulization every 6 (six) hours  as needed for wheezing or shortness of breath.    Historical Provider, MD  amiodarone (PACERONE) 200 MG tablet Take 1 tablet (200 mg total) by mouth every evening. 03/29/16   Larey Dresser, MD  apixaban (ELIQUIS) 5 MG TABS tablet Take 1 tablet (5 mg total) by mouth 2 (two) times daily. 12/25/15   Jolaine Artist, MD  budesonide-formoterol (SYMBICORT) 160-4.5 MCG/ACT inhaler Inhale 2 puffs into the lungs 2 (two) times daily. 01/10/15   Jolaine Artist, MD  cholecalciferol (VITAMIN D) 1000 UNITS tablet Take 1 tablet (1,000 Units total) by mouth 2 (two) times daily. 01/10/15   Jolaine Artist, MD  citalopram (CELEXA) 40 MG tablet Take 1 tablet (40 mg total) by mouth at bedtime. 01/10/15   Jolaine Artist, MD  docusate sodium (COLACE) 100 MG capsule Take 100 mg by mouth 2 (two) times daily.     Historical Provider, MD  furosemide (LASIX) 40 MG tablet Use only if a weight gain of 5 pounds in a week occurs or evident swelling is present 03/29/16   Larey Dresser, MD  hydrocortisone (ANUSOL-HC) 2.5 % rectal cream Place 1 application rectally 2 (two) times daily. 11/21/15  Larey Dresser, MD  levothyroxine (SYNTHROID, LEVOTHROID) 100 MCG tablet Take 1 tablet (100 mcg total) by mouth at bedtime. 02/23/16   Amy D Clegg, NP  losartan (COZAAR) 25 MG tablet Take 25 mg by mouth at bedtime.    Historical Provider, MD  Multiple Vitamin (MULTIVITAMIN WITH MINERALS) TABS tablet Take 1 tablet by mouth daily.    Historical Provider, MD  nitroGLYCERIN (NITROSTAT) 0.4 MG SL tablet Place 0.4 mg under the tongue every 5 (five) minutes as needed for chest pain. Reported on 04/09/2016    Historical Provider, MD  pantoprazole (PROTONIX) 40 MG tablet Take 1 tablet (40 mg total) by mouth 2 (two) times daily. 01/10/15   Jolaine Artist, MD  potassium chloride SA (K-DUR,KLOR-CON) 20 MEQ tablet Take 2 tablets (40 mEq total) by mouth daily. And take extra 1 pill with lasix when needed. 04/30/16   Amy D Ninfa Meeker, NP  PRESCRIPTION  MEDICATION Ferumoxytol (FERAHEME) 510 mg in sodium chloride 0.9 % 100 mL IVPB (Once) Route: Intravenous    Historical Provider, MD  PRESCRIPTION MEDICATION Octreotide (SANDOSTATIN LAR) IM injection 20 mg (Once)Route: Intramuscular    Historical Provider, MD  simvastatin (ZOCOR) 80 MG tablet Take 0.5 tablets (40 mg total) by mouth at bedtime. 01/10/15   Jolaine Artist, MD  tiotropium (SPIRIVA) 18 MCG inhalation capsule Place 1 capsule (18 mcg total) into inhaler and inhale daily. 01/10/15   Jolaine Artist, MD  ursodiol (ACTIGALL) 300 MG capsule Take 1 capsule (300 mg total) by mouth 2 (two) times daily. 03/29/16   Larey Dresser, MD  vitamin B-12 (CYANOCOBALAMIN) 1000 MCG tablet Take 1,000 mcg by mouth at bedtime.    Historical Provider, MD  vitamin C (ASCORBIC ACID) 500 MG tablet Take 1 tablet (500 mg total) by mouth 2 (two) times daily. 01/10/15   Jolaine Artist, MD     Family History  Problem Relation Age of Onset  . Heart attack Mother   . Heart attack Father     Social History   Social History  . Marital status: Married    Spouse name: N/A  . Number of children: N/A  . Years of education: N/A   Social History Main Topics  . Smoking status: Former Smoker    Packs/day: 2.00    Years: 45.00    Types: Cigarettes    Quit date: 11/11/2001  . Smokeless tobacco: Never Used  . Alcohol use Yes     Comment: 05/24/2013 "use to drink beer; hardly nothing since 2003; nothing at all since 12/2011 when I got my LVAD  "  . Drug use: No  . Sexual activity: No   Other Topics Concern  . Not on file   Social History Narrative  . No narrative on file      Review of Systems see above  Vital Signs: BP 90/71 (BP Location: Left Arm)   Pulse 71   Temp 97.5 F (36.4 C) (Oral)   SpO2 96%   Physical Exam patient awake, alert. Chest clear to auscultation anteriorly bilaterally. Heart with regular rate and rhythm. LVAD in place with noted hum. Abdomen soft, positive bowel sounds,  nondistended, gallbladder drain intact, insertion site okay, nontender, small scab noted at insertion site; brown bilious output in bag  Mallampati Score:     Imaging: Dg Sinus/fist Tube Chk-non Gi  Result Date: 05/14/2016 CLINICAL DATA:  Acute calculus cholecystitis, status post percutaneous cholecystostomy EXAM: ABSCESS INJECTION CONTRAST:  10 cc Omnipaque 300 FLUOROSCOPY TIME:  Radiation Exposure Index (as provided by the fluoroscopic device): 109 dGycm2 If the device does not provide the exposure index: Fluoroscopy Time (in minutes and seconds):  2 minutes 18 seconds Number of Acquired Images:  8 COMPARISON:  02/29/2016 FINDINGS: Contrast injection performed of the existing cholecystostomy. Cholangiogram: Initially the cholecystostomy was partially occluded. With pressure injection the tube obstruction was relieved. Small filling defects within the collapsed gallbladder noted compatible with cholelithiasis. Cystic duct and common bile duct are patent. Contrast drains into the duodenum. Persistent small nonobstructing filling defect in the distal CBD compatible with choledocholithiasis. IMPRESSION: Patent cystic duct and common bile duct. Cholelithiasis Distal CBD nonobstructing choledocholithiasis. Electronically Signed   By: Jerilynn Mages.  Shick M.D.   On: 05/14/2016 10:34    Labs:  CBC:  Recent Labs  04/09/16 1047 04/23/16 1159 04/30/16 1056 05/07/16 0859  WBC 5.3 4.5 4.7 4.4  HGB 9.6* 10.0* 9.7* 10.1*  HCT 32.0* 33.2* 33.4* 33.7*  PLT 118* 136* 128* 116*    COAGS:  Recent Labs  03/01/16 0242 03/02/16 0215 03/03/16 0330 03/26/16 2112  INR 1.20 1.36 1.56* 2.32*    BMP:  Recent Labs  03/02/16 0215 03/03/16 0330 03/29/16 1016 04/30/16 1056  NA 141 141 141 144  K 3.5 3.3* 3.3* 2.6*  CL 107 107 109 114*  CO2 28 28 24 26   GLUCOSE 131* 107* 124* 119*  BUN 12 13 14 8   CALCIUM 9.2 9.5 9.1 8.2*  CREATININE 0.93 0.94 1.12 1.03  GFRNONAA >60 >60 >60 >60  GFRAA >60 >60 >60 >60      LIVER FUNCTION TESTS:  Recent Labs  03/02/16 0806 03/03/16 0330 03/29/16 1016 04/30/16 1056  BILITOT 2.3* 2.1* 1.0 0.4  AST 43* 39 42* 28  ALT 74* 62 26 15*  ALKPHOS 176* 192* 76 75  PROT 5.3* 5.8* 5.8* 5.2*  ALBUMIN 2.2* 2.2* 3.0* 2.9*    TUMOR MARKERS: No results for input(s): AFPTM, CEA, CA199, CHROMGRNA in the last 8760 hours.  Assessment: Patient with history of acute cholecystitis, status post percutaneous cholecystostomy on 02/29/16. Bile cultures at the time grew klebsiella and patient has completed course of Cipro. He is currently asymptomatic and afebrile. Most recent laboratory studies from 05/07/16 revealed  normal WBC, hemoglobin 10.1, platelets 116k, K2.6- patient on supplementation currently, creatinine 1.03, LFT's ok. Patient given new drain bag as well as new adhesive dressing today. Recommend once daily flushing of drain with 5 mL sterile normal saline. Instructions for flushing were given to the patient's wife. Please refer to Dr. Fritz Pickerel attestation for findings of gallbladder drain injection study. Patient scheduled for follow-up with Dr. Dalbert Batman later this month to determine further plan of care.    Electronically Signed: D. Rowe Robert 05/14/2016, 10:41 AM   Please refer to Dr. Annamaria Boots attestation of this note for management and plan.

## 2016-05-20 ENCOUNTER — Inpatient Hospital Stay (HOSPITAL_COMMUNITY)
Admission: RE | Admit: 2016-05-20 | Discharge: 2016-05-20 | Disposition: A | Discharge status: Home / Self Care | Payer: No Typology Code available for payment source | Source: Ambulatory Visit

## 2016-05-20 DIAGNOSIS — Z95811 Presence of heart assist device: Secondary | ICD-10-CM

## 2016-05-20 NOTE — Progress Notes (Signed)
Pt paged Sunday afternoon stating that PM was alarming every 2 minutes. Pt presented to clinic for internal battery change out. VAD coordinator place 14V [id number]. Current battery had only been in place since March of 2017.

## 2016-05-21 ENCOUNTER — Other Ambulatory Visit (HOSPITAL_BASED_OUTPATIENT_CLINIC_OR_DEPARTMENT_OTHER): Payer: No Typology Code available for payment source

## 2016-05-21 DIAGNOSIS — D509 Iron deficiency anemia, unspecified: Secondary | ICD-10-CM

## 2016-05-21 DIAGNOSIS — D696 Thrombocytopenia, unspecified: Secondary | ICD-10-CM

## 2016-05-21 DIAGNOSIS — D62 Acute posthemorrhagic anemia: Secondary | ICD-10-CM

## 2016-05-21 DIAGNOSIS — D500 Iron deficiency anemia secondary to blood loss (chronic): Secondary | ICD-10-CM

## 2016-05-21 LAB — CBC WITH DIFFERENTIAL/PLATELET
BASO%: 0.4 % (ref 0.0–2.0)
Basophils Absolute: 0 10*3/uL (ref 0.0–0.1)
EOS%: 2.3 % (ref 0.0–7.0)
Eosinophils Absolute: 0.1 10*3/uL (ref 0.0–0.5)
HEMATOCRIT: 35.2 % — AB (ref 38.4–49.9)
HGB: 10.8 g/dL — ABNORMAL LOW (ref 13.0–17.1)
LYMPH#: 0.4 10*3/uL — AB (ref 0.9–3.3)
LYMPH%: 9.1 % — ABNORMAL LOW (ref 14.0–49.0)
MCH: 28.9 pg (ref 27.2–33.4)
MCHC: 30.7 g/dL — AB (ref 32.0–36.0)
MCV: 94.2 fL (ref 79.3–98.0)
MONO#: 0.3 10*3/uL (ref 0.1–0.9)
MONO%: 7 % (ref 0.0–14.0)
NEUT%: 81.2 % — AB (ref 39.0–75.0)
NEUTROS ABS: 4 10*3/uL (ref 1.5–6.5)
PLATELETS: 117 10*3/uL — AB (ref 140–400)
RBC: 3.74 10*6/uL — AB (ref 4.20–5.82)
RDW: 18.4 % — ABNORMAL HIGH (ref 11.0–14.6)
WBC: 4.9 10*3/uL (ref 4.0–10.3)

## 2016-05-24 ENCOUNTER — Telehealth: Payer: Self-pay | Admitting: Gastroenterology

## 2016-05-24 NOTE — Telephone Encounter (Signed)
Former Dr. Deatra Ina patient. Dr. Dalbert Batman wants patient to see Dr. Ardis Hughs for evaluation and management of common bile duct stones. Is transfer ok? Thanks.

## 2016-05-27 ENCOUNTER — Other Ambulatory Visit (HOSPITAL_COMMUNITY): Payer: Self-pay | Admitting: Infectious Diseases

## 2016-05-27 ENCOUNTER — Ambulatory Visit (HOSPITAL_COMMUNITY)
Admission: RE | Admit: 2016-05-27 | Discharge: 2016-05-27 | Disposition: A | Discharge status: Home / Self Care | Payer: Medicare Other | Source: Ambulatory Visit | Attending: Cardiology | Admitting: Cardiology

## 2016-05-27 VITALS — BP 99/57 | HR 72 | Wt 202.0 lb

## 2016-05-27 DIAGNOSIS — Z87891 Personal history of nicotine dependence: Secondary | ICD-10-CM | POA: Insufficient documentation

## 2016-05-27 DIAGNOSIS — Z79899 Other long term (current) drug therapy: Secondary | ICD-10-CM | POA: Insufficient documentation

## 2016-05-27 DIAGNOSIS — R0902 Hypoxemia: Secondary | ICD-10-CM | POA: Insufficient documentation

## 2016-05-27 DIAGNOSIS — I110 Hypertensive heart disease with heart failure: Secondary | ICD-10-CM | POA: Insufficient documentation

## 2016-05-27 DIAGNOSIS — K8020 Calculus of gallbladder without cholecystitis without obstruction: Secondary | ICD-10-CM

## 2016-05-27 DIAGNOSIS — I484 Atypical atrial flutter: Secondary | ICD-10-CM | POA: Diagnosis not present

## 2016-05-27 DIAGNOSIS — D649 Anemia, unspecified: Secondary | ICD-10-CM | POA: Diagnosis not present

## 2016-05-27 DIAGNOSIS — Z95811 Presence of heart assist device: Secondary | ICD-10-CM | POA: Diagnosis not present

## 2016-05-27 DIAGNOSIS — J449 Chronic obstructive pulmonary disease, unspecified: Secondary | ICD-10-CM | POA: Insufficient documentation

## 2016-05-27 DIAGNOSIS — Z7901 Long term (current) use of anticoagulants: Secondary | ICD-10-CM | POA: Insufficient documentation

## 2016-05-27 DIAGNOSIS — G4733 Obstructive sleep apnea (adult) (pediatric): Secondary | ICD-10-CM | POA: Diagnosis not present

## 2016-05-27 DIAGNOSIS — Z9981 Dependence on supplemental oxygen: Secondary | ICD-10-CM | POA: Diagnosis not present

## 2016-05-27 DIAGNOSIS — I480 Paroxysmal atrial fibrillation: Secondary | ICD-10-CM | POA: Diagnosis not present

## 2016-05-27 DIAGNOSIS — Z951 Presence of aortocoronary bypass graft: Secondary | ICD-10-CM | POA: Insufficient documentation

## 2016-05-27 DIAGNOSIS — I2510 Atherosclerotic heart disease of native coronary artery without angina pectoris: Secondary | ICD-10-CM | POA: Insufficient documentation

## 2016-05-27 DIAGNOSIS — E785 Hyperlipidemia, unspecified: Secondary | ICD-10-CM | POA: Insufficient documentation

## 2016-05-27 DIAGNOSIS — D509 Iron deficiency anemia, unspecified: Secondary | ICD-10-CM

## 2016-05-27 DIAGNOSIS — F329 Major depressive disorder, single episode, unspecified: Secondary | ICD-10-CM | POA: Diagnosis not present

## 2016-05-27 DIAGNOSIS — I252 Old myocardial infarction: Secondary | ICD-10-CM | POA: Insufficient documentation

## 2016-05-27 DIAGNOSIS — Z7951 Long term (current) use of inhaled steroids: Secondary | ICD-10-CM | POA: Diagnosis not present

## 2016-05-27 DIAGNOSIS — K811 Chronic cholecystitis: Secondary | ICD-10-CM | POA: Diagnosis not present

## 2016-05-27 DIAGNOSIS — I5020 Unspecified systolic (congestive) heart failure: Secondary | ICD-10-CM

## 2016-05-27 DIAGNOSIS — I5022 Chronic systolic (congestive) heart failure: Secondary | ICD-10-CM | POA: Insufficient documentation

## 2016-05-27 DIAGNOSIS — E039 Hypothyroidism, unspecified: Secondary | ICD-10-CM | POA: Insufficient documentation

## 2016-05-27 LAB — CBC
HEMATOCRIT: 38.2 % — AB (ref 39.0–52.0)
Hemoglobin: 11.4 g/dL — ABNORMAL LOW (ref 13.0–17.0)
MCH: 29.4 pg (ref 26.0–34.0)
MCHC: 29.8 g/dL — AB (ref 30.0–36.0)
MCV: 98.5 fL (ref 78.0–100.0)
Platelets: 121 10*3/uL — ABNORMAL LOW (ref 150–400)
RBC: 3.88 MIL/uL — ABNORMAL LOW (ref 4.22–5.81)
RDW: 16.8 % — AB (ref 11.5–15.5)
WBC: 6 10*3/uL (ref 4.0–10.5)

## 2016-05-27 LAB — COMPREHENSIVE METABOLIC PANEL
ALBUMIN: 3.5 g/dL (ref 3.5–5.0)
ALT: 20 U/L (ref 17–63)
ANION GAP: 5 (ref 5–15)
AST: 41 U/L (ref 15–41)
Alkaline Phosphatase: 71 U/L (ref 38–126)
BILIRUBIN TOTAL: 0.8 mg/dL (ref 0.3–1.2)
BUN: 13 mg/dL (ref 6–20)
CALCIUM: 9.3 mg/dL (ref 8.9–10.3)
CO2: 22 mmol/L (ref 22–32)
Chloride: 112 mmol/L — ABNORMAL HIGH (ref 101–111)
Creatinine, Ser: 1.08 mg/dL (ref 0.61–1.24)
GFR calc non Af Amer: >60 mL/min (ref >60)
GLUCOSE: 117 mg/dL — AB (ref 65–99)
POTASSIUM: 4.1 mmol/L (ref 3.5–5.1)
Sodium: 139 mmol/L (ref 135–145)
TOTAL PROTEIN: 6.3 g/dL — AB (ref 6.5–8.1)

## 2016-05-27 LAB — TSH: TSH: 7.771 u[IU]/mL — ABNORMAL HIGH (ref 0.350–4.500)

## 2016-05-27 LAB — T4, FREE: FREE T4: 0.85 ng/dL (ref 0.61–1.12)

## 2016-05-27 LAB — LACTATE DEHYDROGENASE: LDH: 237 U/L — AB (ref 98–192)

## 2016-05-27 NOTE — Progress Notes (Signed)
Patient ID: [name], male   DOB: [birth year], 75 y.o.   MRN: [id number]   VAD CLINIC NOTE   [facility] [place] (Dr. 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 Quit smoking 2003. Uses CPAP and O2 every night. He is s/p LVAD HM II implanted 01/12/13 under DT criteria. In 2016 ICD extraction due to pocket infection.   GI Events   02/2013- Had EGD/Colonoscopy which was unrevealing.  6 mm polyp clipped and removed. 06/2013- Push enteroscopy was negative. Colonoscopy without source of bleeding. Capsule endoscopy--> Showed bleeding from duodenum. EGD--->Bleeding from duodenum with clip applied. 08/2014- Enteroscopy showed bleed from jejunum requiring 3 clips.  11/2015-  Enteroscopy was normal. 11/2015- Capsule 12/11/15 2 proximal small bowel AVMs, 2 nonbleeding polyps which were removed. Coumadin was switched to apixaban 01/2016 - EGD with AVM clipping 01/2016- Acute cholecystitis with cholecystostomy tube placed.   LVAD Clinic f/u:  Today he return for follow up. Overall feeling ok. Denies SOB/PND/Orthopnea. No recent falls. Cholecystostomy continues to drain 200-300 cc per day. No abdominal pain. Weight at home 196-200 pounds. Had to take lasix for 3 days in a row for leg edema.  No dizziness. Continues to use CPAP.  Denies  BRBPR or melena. Concerned about his insurance coverage because [facility] may be changing insurance.    LVAD interrogation  See nurse's note above.   Labs (2/16): LDL 305, HCT 38.9 Labs (3/16): K 3.9, creatinine 1.05, LFTs normal Labs (2/17): WBCs 11.2, INR 2.68.  Labs (6/17): K 3.3, creatinine 0.94, HCT 35.1, INR 2.3   Past Medical History:  Diagnosis Date  . Anemia   . Asbestosis(501)    "6 years in the [place]" (05/24/2013)  . Atrial fibrillation ([place])    permanent  . Cancer ([place])    "scraped some off behind my left ear; fast moving; having it cut out 12/05/2015" (11/07/2015)  . CHF (congestive heart failure) ([place])   . Chronic anticoagulation    Afib and LVAD  . Chronic systolic heart failure ([place])   . Complication of anesthesia    "they can't put me all the way to sleep cause of my heart" (11/07/2015)  . COPD (chronic obstructive pulmonary disease) ([place])   . Coronary artery disease   . Depression   . Epistaxis 11/2015, 07/2014  . History of blood transfusion 08/2015 X 10; 11/2015 X 2   "related to bleeding on the inside somewhere" (11/07/2015)  . Hyperlipidemia   . Hypertension   . Hypothyroidism   . Ischemic cardiomyopathy     CABG 2003, PCI 2007  EF 27%(myoview 2012  . LVAD (left ventricular assist device) present ([place]) 12/2012  . Myocardial infarction ([place]) 1990's-2000   "2 in  ~ the 1990's; 1 in ~ 2000" (11/07/2015)  . Obesity   . On home oxygen therapy    "have it available; don't use it" (11/07/2015)  . OSA on CPAP   . Paroxysmal ventricular tachycardia ([facility])     Current Outpatient Prescriptions  Medication Sig Dispense Refill  . albuterol (PROVENTIL) (2.5 MG/3ML) 0.083% nebulizer solution Take 2.5 mg by nebulization every 6 (six) hours as needed for wheezing or shortness of breath.    [name] amiodarone (PACERONE) 200 MG tablet Take 1 tablet (200 mg total) by mouth every evening. 90 tablet 3  . apixaban (  ELIQUIS) 5 MG TABS tablet Take 1 tablet (5 mg total) by mouth 2 (two) times daily. 60 tablet 11  . budesonide-formoterol (SYMBICORT) 160-4.5 MCG/ACT inhaler Inhale 2 puffs into the lungs 2 (two) times daily. 1 Inhaler 3  . cholecalciferol (VITAMIN D) 1000 UNITS tablet Take 1 tablet (1,000 Units total) by mouth 2 (two) times daily. 180 tablet 3  . citalopram (CELEXA) 40 MG tablet Take 1 tablet (40 mg total) by mouth at bedtime. 90 tablet 3  . docusate sodium (COLACE) 100 MG capsule Take 100 mg by mouth 2 (two) times daily.     . furosemide  (LASIX) 40 MG tablet Use only if a weight gain of 5 pounds in a week occurs or evident swelling is present 30 tablet 4  . hydrocortisone (ANUSOL-HC) 2.5 % rectal cream Place 1 application rectally 2 (two) times daily. 30 g 6  . levothyroxine (SYNTHROID, LEVOTHROID) 100 MCG tablet Take 1 tablet (100 mcg total) by mouth at bedtime. 30 tablet 6  . losartan (COZAAR) 25 MG tablet Take 25 mg by mouth at bedtime.    . Multiple Vitamin (MULTIVITAMIN WITH MINERALS) TABS tablet Take 1 tablet by mouth daily.    . nitroGLYCERIN (NITROSTAT) 0.4 MG SL tablet Place 0.4 mg under the tongue every 5 (five) minutes as needed for chest pain. Reported on 04/09/2016    . pantoprazole (PROTONIX) 40 MG tablet Take 1 tablet (40 mg total) by mouth 2 (two) times daily. 180 tablet 3  . potassium chloride SA (K-DUR,KLOR-CON) 20 MEQ tablet Take 2 tablets (40 mEq total) by mouth daily. And take extra 1 pill with lasix when needed. 80 tablet 4  . PRESCRIPTION MEDICATION Ferumoxytol (FERAHEME) 510 mg in sodium chloride 0.9 % 100 mL IVPB (Once) Route: Intravenous    . PRESCRIPTION MEDICATION Octreotide (SANDOSTATIN LAR) IM injection 20 mg (Once)Route: Intramuscular    . simvastatin (ZOCOR) 80 MG tablet Take 0.5 tablets (40 mg total) by mouth at bedtime. 45 tablet 3  . tiotropium (SPIRIVA) 18 MCG inhalation capsule Place 1 capsule (18 mcg total) into inhaler and inhale daily. 90 capsule 3  . ursodiol (ACTIGALL) 300 MG capsule Take 1 capsule (300 mg total) by mouth 2 (two) times daily. 180 capsule 2  . vitamin B-12 (CYANOCOBALAMIN) 1000 MCG tablet Take 1,000 mcg by mouth at bedtime.    . vitamin C (ASCORBIC ACID) 500 MG tablet Take 1 tablet (500 mg total) by mouth 2 (two) times daily. 180 tablet 3   No current facility-administered medications for this encounter.     Tape  REVIEW OF SYSTEMS: All systems negative except as listed in HPI, PMH and Problem list.  Vital signs: HR: 72 Doppler modified systolic:100 Auto cuff:  [id number]  (64) O2 Sat: 94% Wt:  202  lbs  Last weight:  210.9 lbs Ht: 6'1"  GENERAL:Well appearing, male; NAD; wife present. Ambulated in the clinic without difficulty.  HEENT: normal  NECK: Supple, JVP 5-6 no bruits.  No lymphadenopathy or thyromegaly appreciated.   CARDIAC: Mechanical heart sounds with LVAD hum present.  LUNGS:  Clear  ABDOMEN:  Obese, Soft, round, nontender, positive bowel sounds x4.     LVAD exit site: well-healed and incorporated.  Dressing dry and intact.  No erythema, drainage, odor or tenderness.  Stabilization device present and accurately applied.  Driveline dressing is being changed daily per sterile technique. Changed in clinic EXTREMITIES:  Warm and dry, no cyanosis, clubbing, no edema NEUROLOGIC:  Alert and oriented x 4.  Gait steady.  No aphasia.  No dysarthria.  Affect pleasant.    ASSESSMENT AND PLAN:  1. Chronic Systolic HF: s/p LVAD implant 12/2012 for DT.  NYHA II-III.  Volume status stable.  Not on BB due to previous bradycardia.  - Continue diuretics as needed.  - Reinforced the need and importance of daily weights, a low sodium diet, and fluid restriction (less than 2 L a day). Instructed to call the HF clinic if weight increases more than 3 lbs overnight or 5 lbs in a week.  2. Anticoagulation management: On apixaban so no INR. Marland KitchenHe is not on ASA because of bleeding. He is now getting monthly IM octreotide. Followed by Hematology.  3. Anemia/ GIB:  Now on apixaban due to recurrent GI bleeding with normal endoscopy. Hgb stable today. 11.4  Followed by hematology for  feraheme and octreotide.    4. LVAD: HM II.       - VAD parameters stable. Rare PI events.  5. [id number] 30 day event monitor on 04/23/16. In A fib 67% of the time. Goes in and out of A fib. For now continue amio 200 mg daily. May need to stop amio if TSH of LFTs abnormal. Should have yearly eye exam.    6. Depression: Stable. Continue Celexa 40 mg daily 7. OSA: Using CPAP.  8. Hypoxemia:  Uses  home oxygen at night.  This may be due to his baseline COPD. 9. ICD infection - s/p extraction 03/2015.  10. Dizzy/Weak spells with falls: No recent falls.   11. Atrial flutter: Atypical flutter on ECG as above. 30 day event monitor showed A fib 67% of the time. Continue amio for now but will need to follow TSH LFTs closely. .    12. Chronic cholecystitis: He has a cholecystostomy tube in place, no abdominal pain. Seen recently by surgery, continue to follow conservatively for now. He is being referred to GI by Dr Dalbert Batman. Continues drain 200-300 cc per day. If needs cholecystectomy will coordinate with LVAD team and we would be the admitting service.   Follow up in 3-4 weeks.    Amy Clegg NP-C 05/27/2016

## 2016-05-27 NOTE — Addendum Note (Signed)
Encounter addended by: [place] Callas, RN on: 05/27/2016  3:42 PM<BR>    Actions taken: Charge Capture section accepted

## 2016-05-27 NOTE — Progress Notes (Signed)
Symptom  Yes  No  Details   Angina         x Activity:   Claudication                x How far:   Syncope         x When:   Stroke         x   Orthopnea         x How many pillows:   2 for comfort  PND         x How often:  CPAP         x  How many hrs:  All night; no O2  Pedal edema         x   Abd fullness         x   N&V         x   Diaphoresis                x When:   Bleeding               x   Urine color   medium yellow  SOB               x Activity:    Palpitations         x When:  ICD shock         N/A   Hospitlizaitons               x When/where/why:    ED visit               x When/where/why:   Other MD              x When/who/why:    Activity    No formal activity; sedentary.   Fluid    No limitations  Diet    No limitations   Vital signs: HR: 72 Doppler modified systolic: 123XX123 Auto cuff: [id number] (64)  O2 Sat: 94% Wt: 202 lbs (home weight 196.6 lb)  Last weight: 210.9 lbs Ht: 6'1"  LVAD interrogation reveals (reviewed with Amy Clegg):  Speed: 9000 Flow: 4.4 Power: 4.9 w PI:  7.1 Alarms: none Events: Rare     Fixed speed:  9000 Low speed limit: 8400 Primary Controller:  Replace back up battery in 25 months  Back up controller:   Replace back up battery in 26 months   LVAD interrogation NEGATIVE for clinical alarms, STABLE for PI Events. No changes were made to program today. Back up equipment is present today and patient/caregiver do not have any concerns about equipment today.   LVAD exit site:  Well healed and incorporated. The velour is fully implanted at exit site. Dressing dry and intact. No erythema or drainage. Stabilization device present and accurately applied. Driveline dressing is being changed weekly per sterile technique using Sorbaview dressing without biopatch (causes skin irritation) exit site. Pt denies fever or chills.    Encounter Details: Presents to clinic today with his wife for 1 m FU. Visit with Dr. Dalbert Batman in early August. He has since  has had cholangiogram >> showed gall stones still in GB. Dr. Dalbert Batman did not recommend surgery as he was too high risk. Awaiting CB from Dr. Ardis Hughs (GI) for stone retrieval (?) per patient's report. Will call office today to see if we can get appointment scheduled soon. Patient and wife desires surgery if possible and questions. Dr. Haroldine Laws to Dr.  Donne Hazel? Finished up taking ursodiol 300 mg BID and is no longer taking probiotics >> started for diarrhea (3-4 stools a day.)    Reports he has had to take lasix 3d in a row recently for ankle swelling. Also reported red rash to bottoms of feet which has since cleared.   Reports left leg is very weak. Wife feels he is getting weaker. Denies interest for Cardiac Rehab.   GB drain C/D/I. Now being irrigated with sterile salineAdditional drainage bag obtained and given to patient as back up. Plans to see Dr. Dalbert Batman on 05/02/16 with labs 04/30/16 (requested by [name] to be done here and faxed to [place] office).  FU with Dr. Burr Medico for continuous Iron infusions. Next scheduled 06/04/16 also will receive Octreotide at this time.   He is currently on Eliquis 5 mg BID. Denies any dark or bloody stools. Tolerating anticoagulation plan.   Labs: LDH 237 Established baseline 180 - 250. No power elevations or tea-colored urine.   Hgb: 11.4 today. Has not needed any iron infusions recently. Tolerating anticoagulation plan. Denies BRBPR or melanic stool.   Creatinine: 1.08   30d Monitor shows AFib 67% of time, one episode of tachycardia and several episodes of bradycardia (6%). Continue Amiodarone 200 mg QD. TSH/T4 pending today per amiodarone surveillance.      Janene Madeira, RN VAD Coordinator   Office: [phone] 24/7 Emergency VAD Pager: [phone]   Patient Instructions: 1.  No change in medications. 2.  Return to [place] clinic in one month.

## 2016-05-27 NOTE — Addendum Note (Signed)
Encounter addended by: [place] Callas, RN on: 05/27/2016  2:08 PM<BR>    Actions taken: Pend clinical note

## 2016-05-27 NOTE — Progress Notes (Signed)
HeartMate II System Controller SW Version 7.29 upgrade performed in clinic today to accommodate new recommendations from [employer]. Consent reviewed and signed. Tem Tech representative Blair Heys) performed software upgrade.   Upon hooking up patient's back up controller realized nothing on display screen. Hooked to system monitor - showed battery to expire in 74m - provided power as expected to LVAD training loop. Still no display screen. I removed this from service and gave the patient a new controller PCX - [id number] (Exp: 02/28/2019) with 11V backup battery [id number] Exp: 11/30/17. Will verify warranty status and send back to manufacturer.   VAD Team performed elective controller exchange. Patient tolerated controller exchange well and was asymptomatic. Pump restarted as expected with stable VAD parameters on correct prescribed speed of 9000 / 8400 rpms.     Primary: [id number] 59m for back up battery to be replaced Back up: [id number], 72m for back up battery to be replaced  Janene Madeira, RN [place] Coordinator   Office: [phone] 24/7 Emergency VAD Pager: [phone]

## 2016-05-30 ENCOUNTER — Telehealth (HOSPITAL_COMMUNITY): Payer: Self-pay | Admitting: *Deleted

## 2016-05-30 ENCOUNTER — Other Ambulatory Visit: Payer: Self-pay

## 2016-05-30 ENCOUNTER — Telehealth: Payer: Self-pay

## 2016-05-30 DIAGNOSIS — K831 Obstruction of bile duct: Secondary | ICD-10-CM

## 2016-05-30 NOTE — Telephone Encounter (Signed)
I spoke with [name] PA and she will speak with the LVAD team about being present for the ERCP on 06/13/16 at 1 pm at [facility].  She will also advise on Eliquis hold for 2 days.  She will call back after coordinating the case.

## 2016-05-30 NOTE — Telephone Encounter (Signed)
Amy Estrella Deeds, NP  Barron Alvine, RN; Milus Banister, MD Cc: Jolaine Artist, MD; Lezlie Octave, RN; Christinia Gully, RN; [place] Callas, RN; Ivin Poot, MD        [facility] [place] Heider,   I spoke with Dr Haroldine Laws regarding eliquis. Ok to hold eliquis 48 hours prior to ERCP. The VAD team of aware of procedure will coordinate with you and one of the VAD coordinators will be present for the procedure.   Thanks Amy [employer]

## 2016-05-30 NOTE — Telephone Encounter (Signed)
Wife called to report patient's cholecystostomy tube has had no drainage since yesterday. States it had been draining 200 - 300 cc's daily and "just stopped" yesterday. She has been flushing q 48 hours and flushed yesterday with no resistance.[name], NP updated. Per Amy, iInstructed wife to contact Dr. Darrel Hoover office for immediate assistance.   Wife called back after speaking with Dr. Darrel Hoover office. Dr. Darrel Hoover office trying to arrange for appointment with Dr. Edison Nasuti today.

## 2016-05-30 NOTE — Telephone Encounter (Signed)
Per Dr Ardis Hughs the pt needs ERCP on 8/31, or 9/7 with the LVAD team present,(Amy Clegg PA).  Also the pt is on Eliquis and will need to hold for 2 days prior to procedure.  I have talked to [facility] End[facility] and 9/14 is the next available date.  Per Dr Ardis Hughs this date is ok.  Case entered, hospital orders entered.  [name] contacted at [phone]

## 2016-05-31 ENCOUNTER — Telehealth: Payer: Self-pay | Admitting: Gastroenterology

## 2016-05-31 NOTE — Telephone Encounter (Signed)
EUS scheduled, pt instructed and medications reviewed.  Patient instructions mailed to home.  Patient to call with any questions or concerns.  

## 2016-05-31 NOTE — Telephone Encounter (Signed)
See alternate note  

## 2016-05-31 NOTE — Telephone Encounter (Signed)
Esmeralda I Monegro routed conversation to You 17 minutes ago (9:15 AM)    Royston Sinner [phone]  Delane Ginger Monegro 17 minutes ago (9:14 AM)    Incoming call:  Patient wife wants to discuss procedure scheduled for 9/14 with nurse.

## 2016-06-04 ENCOUNTER — Other Ambulatory Visit (HOSPITAL_BASED_OUTPATIENT_CLINIC_OR_DEPARTMENT_OTHER): Payer: No Typology Code available for payment source

## 2016-06-04 ENCOUNTER — Encounter: Payer: Self-pay | Admitting: Hematology

## 2016-06-04 ENCOUNTER — Ambulatory Visit (HOSPITAL_BASED_OUTPATIENT_CLINIC_OR_DEPARTMENT_OTHER): Payer: Non-veteran care | Admitting: Hematology

## 2016-06-04 ENCOUNTER — Ambulatory Visit (HOSPITAL_BASED_OUTPATIENT_CLINIC_OR_DEPARTMENT_OTHER): Payer: No Typology Code available for payment source

## 2016-06-04 VITALS — BP 99/85 | HR 82 | Temp 97.7°F | Resp 17 | Ht 73.0 in | Wt 210.9 lb

## 2016-06-04 DIAGNOSIS — D696 Thrombocytopenia, unspecified: Secondary | ICD-10-CM

## 2016-06-04 DIAGNOSIS — I255 Ischemic cardiomyopathy: Secondary | ICD-10-CM

## 2016-06-04 DIAGNOSIS — D509 Iron deficiency anemia, unspecified: Secondary | ICD-10-CM

## 2016-06-04 DIAGNOSIS — D62 Acute posthemorrhagic anemia: Secondary | ICD-10-CM

## 2016-06-04 DIAGNOSIS — K598 Other specified functional intestinal disorders: Secondary | ICD-10-CM

## 2016-06-04 DIAGNOSIS — Z95811 Presence of heart assist device: Secondary | ICD-10-CM

## 2016-06-04 DIAGNOSIS — D500 Iron deficiency anemia secondary to blood loss (chronic): Secondary | ICD-10-CM

## 2016-06-04 LAB — CBC & DIFF AND RETIC
BASO%: 0.1 % (ref 0.0–2.0)
BASOS ABS: 0 10*3/uL (ref 0.0–0.1)
EOS%: 0.9 % (ref 0.0–7.0)
Eosinophils Absolute: 0.1 10*3/uL (ref 0.0–0.5)
HEMATOCRIT: 36.5 % — AB (ref 38.4–49.9)
HGB: 11.2 g/dL — ABNORMAL LOW (ref 13.0–17.1)
IMMATURE RETIC FRACT: 22.3 % — AB (ref 3.00–10.60)
LYMPH#: 0.5 10*3/uL — AB (ref 0.9–3.3)
LYMPH%: 5.7 % — ABNORMAL LOW (ref 14.0–49.0)
MCH: 29.6 pg (ref 27.2–33.4)
MCHC: 30.7 g/dL — ABNORMAL LOW (ref 32.0–36.0)
MCV: 96.3 fL (ref 79.3–98.0)
MONO#: 0.5 10*3/uL (ref 0.1–0.9)
MONO%: 6.4 % (ref 0.0–14.0)
NEUT#: 7.1 10*3/uL — ABNORMAL HIGH (ref 1.5–6.5)
NEUT%: 86.9 % — AB (ref 39.0–75.0)
PLATELETS: 106 10*3/uL — AB (ref 140–400)
RBC: 3.79 10*6/uL — ABNORMAL LOW (ref 4.20–5.82)
RDW: 17.3 % — ABNORMAL HIGH (ref 11.0–14.6)
RETIC CT ABS: 93.99 10*3/uL — AB (ref 34.80–93.90)
Retic %: 2.48 % — ABNORMAL HIGH (ref 0.80–1.80)
WBC: 8.1 10*3/uL (ref 4.0–10.3)

## 2016-06-04 LAB — COMPREHENSIVE METABOLIC PANEL
ALBUMIN: 3.4 g/dL — AB (ref 3.5–5.0)
ALT: 17 U/L (ref 0–55)
ANION GAP: 9 meq/L (ref 3–11)
AST: 34 U/L (ref 5–34)
Alkaline Phosphatase: 90 U/L (ref 40–150)
BILIRUBIN TOTAL: 1.19 mg/dL (ref 0.20–1.20)
BUN: 12 mg/dL (ref 7.0–26.0)
CALCIUM: 9.3 mg/dL (ref 8.4–10.4)
CO2: 24 mEq/L (ref 22–29)
CREATININE: 0.8 mg/dL (ref 0.7–1.3)
Chloride: 111 mEq/L — ABNORMAL HIGH (ref 98–109)
EGFR: 87 mL/min/{1.73_m2} — ABNORMAL LOW (ref >90)
Glucose: 121 mg/dl (ref 70–140)
Potassium: 4.6 mEq/L (ref 3.5–5.1)
Sodium: 143 mEq/L (ref 136–145)
TOTAL PROTEIN: 6.6 g/dL (ref 6.4–8.3)

## 2016-06-04 LAB — FERRITIN: FERRITIN: 60 ng/mL (ref 22–316)

## 2016-06-04 MED ORDER — SODIUM CHLORIDE 0.9 % IV SOLN: 510.0000 mg | Freq: Once | INTRAVENOUS | Status: DC

## 2016-06-04 MED ORDER — OCTREOTIDE ACETATE 20 MG IM KIT
20.0000 mg | PACK | Freq: Once | INTRAMUSCULAR | Status: AC
  Administered 2016-06-04: 20 mg via INTRAMUSCULAR
  Filled 2016-06-04: qty 1

## 2016-06-04 NOTE — Patient Instructions (Signed)

## 2016-06-04 NOTE — Progress Notes (Signed)
[facility]  Telephone:([phone] Fax:([phone]) [phone]  Clinic Follow Up Note   Patient Care Team: Thressa Sheller, MD as PCP - General (Internal Medicine) Jolaine Artist, MD as Attending Physician (Cardiology) Ivin Poot, MD as Attending Physician (Cardiothoracic Surgery) 06/04/2016  CHIEF COMPLAINTS:  Follow up anemia  HISTORY OF PRESENTING ILLNESS (11/01/2014):  [name] 75 y.o. male is here because of anemia.   He has extensive cardiac history. He had multiple heart attacks in the past 15 years, he had cardiac bypass surgery in 2003, PCI in 2007, long-standing history of , a atrial fibrillationnd was found to have ischemic cardiomyopathy with EF 27% in 2012.  He initially had AICD placed, and finally had LVAD placed in 2014 for his end-stage heart failure. He has been on Coumadin for more than 10 years for the cardiac issues.   He reports he has had anemia for several years, he used to take iron pill. He also received some IV iron a few years ago. In our electronic medical records, his lab showed anemia since 12/2012.   He had 3 episodes of GI bleeding since June of 2015 which are required blood transfusion, last episode was one month ago and he required 4u RBC  at that time. His EGD showed area of bleeding from duodenum all jejunum and he was treated endoscopyly.   He feels well overall. He denies significant chest pain or abdominal discomfort. He has mild dyspnea on moderate exertion. He has mild to moderate fatigue, but is able to function well at home,  does all his ADLs and daily activities.  TREATMENT: 1. IV Feraheme 510 mg as needed, received in 10/2014, 01/2015, 03/2015, 06/2015, 08/2015, 10/2015, 11/2015, 12/22/2015, 01/22/2016, 01/29/2016, 03/12/16, 03/22/2016, 04/26/16, blood transfusion as needed if Hb<9 2. 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 Jakobsen returns  for follow-up. He has been stable lately, no overt GI bleeding or hospitalization in the past few months. He still has the percutaneous biliary drain tube, which is not draining any more. His wife has been flushes with saline. He denies any significant pain, eating well. He is scheduled to have a procedure with Dr. Ardis Hughs on 06/13/2016. He is going to see Dr. Donne Hazel for a second surgical opinion about cholecystectomy. He denies any melena, hematochezia, or other bleeding. No other new complaints  MEDICAL HISTORY:  Past Medical History  Diagnosis Date  . Ischemic cardiomyopathy      CABG 2003, PCI 2007  EF 27%(myoview 2012  . Chronic systolic heart failure   . Hyperlipidemia   . Hypothyroidism   . Chronic anticoagulation     Afib and LVAD in 2014   . Obesity   . COPD (chronic obstructive pulmonary disease)   . Asbestosis(501)     "6 years in the [place]" (05/24/2013)  . Atrial fibrillation     permanent  . Paroxysmal ventricular tachycardia   . Coronary artery disease   . Implantable cardioverter-defibrillator [employer]   . Hypertension   . Myocardial infarction 1990's-2000    "2 in  ~ the 1990's; 1 in ~ 2000" (05/24/2013)  . OSA on CPAP   . Depression   . LVAD (left ventricular assist device) present 12/2012    SURGICAL HISTORY: Past Surgical History:  Procedure Laterality Date  . APPENDECTOMY    . CARDIAC DEFIBRILLATOR PLACEMENT  2004; ~ 2010   "cut it out in 2016  after it got infected"   . COLONOSCOPY N/A 03/09/2014   Procedure: COLONOSCOPY;  Surgeon: Inda Castle, MD;  Location: [place];  Service: Endoscopy;  Laterality: N/A;  LVAD  patient  . COLONOSCOPY N/A 07/29/2014   Procedure: COLONOSCOPY;  Surgeon: Gatha Mayer, MD;  Location: [place];  Service: Endoscopy;  Laterality: N/A;  . CORONARY ARTERY BYPASS GRAFT  2003   "?X4" (05/24/2013)  . ENTEROSCOPY N/A 07/27/2014   Procedure: ENTEROSCOPY;  Surgeon: Gatha Mayer, MD;  Location: [facility] ENDOSCOPY;  Service:  Endoscopy;  Laterality: N/A;  LVAD patient   . ENTEROSCOPY N/A 09/27/2014   Procedure: ENTEROSCOPY;  Surgeon: Gatha Mayer, MD;  Location: [facility] ENDOSCOPY;  Service: Endoscopy;  Laterality: N/A;  . ENTEROSCOPY N/A 11/09/2015   Procedure: ENTEROSCOPY;  Surgeon: Mauri Pole, MD;  Location: [place] ENDOSCOPY;  Service: Endoscopy;  Laterality: N/A;  LVAD  . ENTEROSCOPY N/A 12/13/2015   Procedure: ENTEROSCOPY;  Surgeon: Milus Banister, MD;  Location: [place];  Service: Endoscopy;  Laterality: N/A;  . ENTEROSCOPY N/A 02/21/2016   Procedure: ENTEROSCOPY;  Surgeon: Milus Banister, MD;  Location: [place];  Service: Endoscopy;  Laterality: N/A;  . ESOPHAGOGASTRODUODENOSCOPY N/A 03/09/2014   Procedure: ESOPHAGOGASTRODUODENOSCOPY (EGD);  Surgeon: Inda Castle, MD;  Location: [place];  Service: Endoscopy;  Laterality: N/A;  . ESOPHAGOGASTRODUODENOSCOPY N/A 08/01/2014   Procedure: ESOPHAGOGASTRODUODENOSCOPY (EGD);  Surgeon: Jerene Bears, MD;  Location[facility] ENDOSCOPY;  Service: Endoscopy;  Laterality: N/A;  LVAD patient  . GIVENS CAPSULE STUDY N/A 03/10/2014   Procedure: GIVENS CAPSULE STUDY;  Surgeon: Inda Castle, MD;  Location: [place];  Service: Endoscopy;  Laterality: N/A;  . GIVENS CAPSULE STUDY N/A 07/30/2014   Procedure: GIVENS CAPSULE STUDY;  Surgeon: Gatha Mayer, MD;  Location: [place];  Service: Endoscopy;  Laterality: N/A;  . GIVENS CAPSULE STUDY N/A 12/11/2015   Procedure: GIVENS CAPSULE STUDY;  Surgeon: Gatha Mayer, MD;  Location: [place];  Service: Endoscopy;  Laterality: N/A;  . ICD LEAD REMOVAL Left 03/16/2015   Procedure: ICD LEAD REMOVAL/EXTRACTION;  Surgeon: Evans Lance, MD;  Location: [place];  Service: Cardiovascular;  Laterality: Left;  PATIENT HAS LVAD  DR. VAN TRIGT TO BACK UP EXTRACTION  . INSERTION OF IMPLANTABLE LEFT VENTRICULAR ASSIST DEVICE N/A 01/12/2013   Procedure: INSERTION OF IMPLANTABLE LEFT VENTRICULAR ASSIST DEVICE;  Surgeon: Ivin Poot, MD;  Location: [place];  Service: Open Heart Surgery;  Laterality: N/A;   nitric oxide; Redo sternotomy  . INTRAOPERATIVE TRANSESOPHAGEAL ECHOCARDIOGRAM N/A 01/12/2013   Procedure: INTRAOPERATIVE TRANSESOPHAGEAL ECHOCARDIOGRAM;  Surgeon: Ivin Poot, MD;  Location: [place];  Service: Open Heart Surgery;  Laterality: N/A;  . LEFT AND RIGHT HEART CATHETERIZATION WITH CORONARY/GRAFT ANGIOGRAM  01/07/2013   Procedure: LEFT AND RIGHT HEART CATHETERIZATION WITH Beatrix Fetters;  Surgeon: Jolaine Artist, MD;  [facility] C[facility] LAB;  Service: Cardiovascular;;    SOCIAL HISTORY: Social History   Social History  . Marital status: Married    Spouse name: N/A  . Number of children: N/A  . Years of education: N/A   Occupational History  . Not on file.   Social History Main Topics  . Smoking status: Former Smoker    Packs/day: 2.00    Years: 45.00    Types: Cigarettes    Quit date: 11/11/2001  . Smokeless tobacco: Never Used  . Alcohol use Yes     Comment: 05/24/2013 "use to drink beer; hardly nothing since  2003; nothing at all since 12/2011 when I got my LVAD  "  . Drug use: No  . Sexual activity: No   Other Topics Concern  . Not on file   Social History Narrative  . No narrative on file    FAMILY HISTORY: Family History  Problem Relation Age of Onset  . Heart attack Mother   . Heart attack Father     ALLERGIES:  is allergic to tape.  MEDICATIONS:  Current Outpatient Prescriptions  Medication Sig Dispense Refill  . albuterol (PROVENTIL) (2.5 MG/3ML) 0.083% nebulizer solution Take 2.5 mg by nebulization every 6 (six) hours as needed for wheezing or shortness of breath.    [name] amiodarone (PACERONE) 200 MG tablet Take 1 tablet (200 mg total) by mouth every evening. 90 tablet 3  . apixaban (ELIQUIS) 5 MG TABS tablet Take 1 tablet (5 mg total) by mouth 2 (two) times daily. 60 tablet 11  . budesonide-formoterol (SYMBICORT) 160-4.5 MCG/ACT inhaler Inhale 2 puffs into  the lungs 2 (two) times daily. 1 Inhaler 3  . cholecalciferol (VITAMIN D) 1000 UNITS tablet Take 1 tablet (1,000 Units total) by mouth 2 (two) times daily. 180 tablet 3  . citalopram (CELEXA) 40 MG tablet Take 1 tablet (40 mg total) by mouth at bedtime. 90 tablet 3  . docusate sodium (COLACE) 100 MG capsule Take 100 mg by mouth 2 (two) times daily.     . furosemide (LASIX) 40 MG tablet Use only if a weight gain of 5 pounds in a week occurs or evident swelling is present 30 tablet 4  . hydrocortisone (ANUSOL-HC) 2.5 % rectal cream Place 1 application rectally 2 (two) times daily. 30 g 6  . levothyroxine (SYNTHROID, LEVOTHROID) 100 MCG tablet Take 1 tablet (100 mcg total) by mouth at bedtime. 30 tablet 6  . losartan (COZAAR) 25 MG tablet Take 25 mg by mouth at bedtime.    . Multiple Vitamin (MULTIVITAMIN WITH MINERALS) TABS tablet Take 1 tablet by mouth daily.    . nitroGLYCERIN (NITROSTAT) 0.4 MG SL tablet Place 0.4 mg under the tongue every 5 (five) minutes as needed for chest pain. Reported on 04/09/2016    . pantoprazole (PROTONIX) 40 MG tablet Take 1 tablet (40 mg total) by mouth 2 (two) times daily. 180 tablet 3  . potassium chloride SA (K-DUR,KLOR-CON) 20 MEQ tablet Take 2 tablets (40 mEq total) by mouth daily. And take extra 1 pill with lasix when needed. 80 tablet 4  . PRESCRIPTION MEDICATION Ferumoxytol (FERAHEME) 510 mg in sodium chloride 0.9 % 100 mL IVPB (Once) Route: Intravenous    . PRESCRIPTION MEDICATION Octreotide (SANDOSTATIN LAR) IM injection 20 mg (Once)Route: Intramuscular    . simvastatin (ZOCOR) 80 MG tablet Take 0.5 tablets (40 mg total) by mouth at bedtime. 45 tablet 3  . tiotropium (SPIRIVA) 18 MCG inhalation capsule Place 1 capsule (18 mcg total) into inhaler and inhale daily. 90 capsule 3  . vitamin B-12 (CYANOCOBALAMIN) 1000 MCG tablet Take 1,000 mcg by mouth at bedtime.    . vitamin C (ASCORBIC ACID) 500 MG tablet Take 1 tablet (500 mg total) by mouth 2 (two) times daily.  180 tablet 3   No current facility-administered medications for this visit.     REVIEW OF SYSTEMS:   Constitutional: Denies fevers, chills or abnormal night sweats Eyes: Denies blurriness of vision, double vision or watery eyes Ears, nose, mouth, throat, and face: Denies mucositis or sore throat Respiratory: Denies cough, dyspnea or wheezes Cardiovascular: Denies  palpitation, chest discomfort or lower extremity swelling Gastrointestinal:  Denies nausea, heartburn or change in bowel habits Skin: Denies abnormal skin rashes Lymphatics: Denies new lymphadenopathy or easy bruising Neurological:Denies numbness, tingling or new weaknesses Behavioral/Psych: Mood is stable, no new changes  All other systems were reviewed with the patient and are negative.  PHYSICAL EXAMINATION: ECOG PERFORMANCE STATUS: 1 - Symptomatic but completely ambulatory  Vitals:   06/04/16 1051 06/04/16 1055  BP: 93/80 99/85  Pulse: 80 82  Resp: 17   Temp: 97.7 F (36.5 C)    Filed Weights   06/04/16 1051  Weight: 210 lb 14.4 oz (95.7 kg)    GENERAL:alert, no distress and comfortable SKIN: skin color, texture, turgor are normal, no rashes or significant lesions EYES: normal, conjunctiva are pink and non-injected, sclera clear OROPHARYNX:no exudate, no erythema and lips, buccal mucosa, and tongue normal  NECK: supple, thyroid normal size, non-tender, without nodularity LYMPH:  no palpable lymphadenopathy in the cervical, axillary or inguinal LUNGS: clear to auscultation and percussion with normal breathing effort HEART: (+) LVAD blood flow   ABDOMEN:abdomen soft, non-tender and normal bowel sounds, (+) percutaneous biliary drainage tube in the right upper quadrant of abdomen Musculoskeletal:no cyanosis of digits and no clubbing  PSYCH: alert & oriented x 3 with fluent speech NEURO: no focal motor/sensory deficits  LABORATORY DATA:  I have reviewed the data as listed CBC Latest Ref Rng & Units 06/04/2016  05/27/2016 05/21/2016  WBC 4.0 - 10.3 10e3/uL 8.1 6.0 4.9  Hemoglobin 13.0 - 17.1 g/dL 11.2(L) 11.4(L) 10.8(L)  Hematocrit 38.4 - 49.9 % 36.5(L) 38.2(L) 35.2(L)  Platelets 140 - 400 10e3/uL 106(L) 121(L) 117(L)    CMP Latest Ref Rng & Units 06/04/2016 05/27/2016 04/30/2016  Glucose 70 - 140 mg/dl 121 117(H) 119(H)  BUN 7.0 - 26.0 mg/dL 12.0 13 8  Creatinine 0.7 - 1.3 mg/dL 0.8 1.08 1.03  Sodium 136 - 145 mEq/L 143 139 144  Potassium 3.5 - 5.1 mEq/L 4.6 4.1 2.6(LL)  Chloride 101 - 111 mmol/L - 112(H) 114(H)  CO2 22 - 29 mEq/L 24 22 26   Calcium 8.4 - 10.4 mg/dL 9.3 9.3 8.2(L)  Total Protein 6.4 - 8.3 g/dL 6.6 6.3(L) 5.2(L)  Total Bilirubin 0.20 - 1.20 mg/dL 1.19 0.8 0.4  Alkaline Phos 40 - 150 U/L 90 71 75  AST 5 - 34 U/L 34 41 28  ALT 0 - 55 U/L 17 20 15(L)   Results for SPYRIDON, KENYON (MRN [id number]) as of 06/04/2016 11:22  Ref. Range 05/07/2016 08:59 05/07/2016 08:59 06/04/2016 09:37  Iron Latest Ref Range: 42 - 163 ug/dL 29 (L)    UIBC Latest Ref Range: 117 - 376 ug/dL 212    TIBC Latest Ref Range: 202 - 409 ug/dL 241    %SAT Latest Ref Range: 20 - 55 % 12 (L)    Ferritin Latest Ref Range: 22 - 316 ng/ml  195 60   RADIOGRAPHIC STUDIES: I have personally reviewed the radiological images as listed and agreed with the findings in the report. Dg Sinus/fist Tube Chk-non Gi  Result Date: 05/14/2016 CLINICAL DATA:  Acute calculus cholecystitis, status post percutaneous cholecystostomy EXAM: ABSCESS INJECTION CONTRAST:  10 cc Omnipaque 300 FLUOROSCOPY TIME:  Radiation Exposure Index (as provided by the fluoroscopic device): 109 dGycm2 If the device does not provide the exposure index: Fluoroscopy Time (in minutes and seconds):  2 minutes 18 seconds Number of Acquired Images:  8 COMPARISON:  02/29/2016 FINDINGS: Contrast injection performed of the existing cholecystostomy.  Cholangiogram: Initially the cholecystostomy was partially occluded. With pressure injection the tube obstruction was relieved.  Small filling defects within the collapsed gallbladder noted compatible with cholelithiasis. Cystic duct and common bile duct are patent. Contrast drains into the duodenum. Persistent small nonobstructing filling defect in the distal CBD compatible with choledocholithiasis. IMPRESSION: Patent cystic duct and common bile duct. Cholelithiasis Distal CBD nonobstructing choledocholithiasis. Electronically Signed   By: Jerilynn Mages.  Shick M.D.   On: 05/14/2016 10:34    ASSESSMENT & PLAN:  75 year old Caucasian male, with extensive cardiac history, including CAD, ischemic cardiomyopathy status post LVAD, on long-term Coumadin, presented with multiple episodes of GI bleeding and anemia.  1. 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 -His iron studies showed low ferritin level and serum iron, consistent with iron deficient anemia. This is secondary to his GI bleeding. -He responded to IV iron very well. 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  -His Coumadin has been changed to Eliquis now  -Continue Feraheme if ferritin less than 100, I'll set up infusion appointment if needed -He will also receive red blood cell if his hemoglobin less than 9  -Continue IM Sandostatin for his GI bleeding from AVM, recommended by his cardiologist Dr. Haroldine Laws  -His ferritin today is 60, I'll set up weekly Feraheme twice for the next few weeks -Continue labs every 2 weeks  2. Mild thrombocytopenia -Secondary to LVAD -Plt count has been stable   3. chronic cholecystitis -Status post percutaneous bile drainage -He is scheduled to have a procedure with Dr. Ardis Hughs on September 14, and see Dr. Donne Hazel for second opinion of cholecystectomy  3. He will continue to follow-up with his primary care physician and cardiologist for his HTN, heart disease,  hypothyroidism, OSA and mild liver cirrhosis..  Plan -Repeat CBC and ferritin every 2 weeks and iron study monthly  Here -IV Feraheme  510mg  X2 in the next 2 week, and continue if ferritin <100 or low serum iron. I asked the patient to call me the day after his lab test, to see if he needs IV Feraheme. -2u RBC if Hb<9.0  -continuet Sandostatin injection every 4 weeks -I'll see him back in 8 weeks  All questions were answered. The patient knows to call the clinic with any problems, questions or concerns.  I spent 20 minutes counseling the patient face to face. The total time spent in the appointment was 25 minutes and more than 50% was on counseling.     Truitt Merle, MD 06/04/2016

## 2016-06-05 ENCOUNTER — Telehealth: Payer: Self-pay | Admitting: *Deleted

## 2016-06-05 NOTE — Telephone Encounter (Signed)
Received call from wife [name] informing office that pt has been approved for appts at [facility] by [place].   [name] would like to be contacted with pt's appts for Feraheme, and injections.  Message routed to [facility], lead scheduler. Pt's    Phone     [phone].

## 2016-06-06 ENCOUNTER — Telehealth: Payer: Self-pay | Admitting: Hematology

## 2016-06-06 NOTE — Telephone Encounter (Signed)
Spoke with wife re next appointment for 9/15. Patient to get schedule at next visit.

## 2016-06-10 ENCOUNTER — Telehealth: Payer: Self-pay | Admitting: Gastroenterology

## 2016-06-10 NOTE — Telephone Encounter (Signed)
NOTE PUT IN REFERRAL FOR ERCP [place] FOR 06/13/16

## 2016-06-10 NOTE — Telephone Encounter (Signed)
I spoke with the [place] and we discussed the pt at length, his ED visits and admission.  I have faxed records to [phone] and I have asked that the auth be expedited.  I was told there is no guarantee and if the service is not offered at the [place] it will be covered.

## 2016-06-11 ENCOUNTER — Telehealth (HOSPITAL_COMMUNITY): Payer: Self-pay | Admitting: *Deleted

## 2016-06-11 NOTE — Telephone Encounter (Signed)
Deborah Chalk and confirmed she will be holding pt's Eliquis today and tomorrow per Dr. Haroldine Laws for upcoming ERCP scheduled Thursday, 04/12/16. Wife verbalized understanding of same.

## 2016-06-12 ENCOUNTER — Encounter (HOSPITAL_COMMUNITY): Payer: Self-pay | Admitting: *Deleted

## 2016-06-12 IMAGING — CR DG CHEST 1V PORT
1 series · 1 of 1 positions shown · non-contrast
Comparison: September 25, 2014.

CLINICAL DATA: Dyspnea.

EXAM:
PORTABLE CHEST - 1 VIEW

[AP]
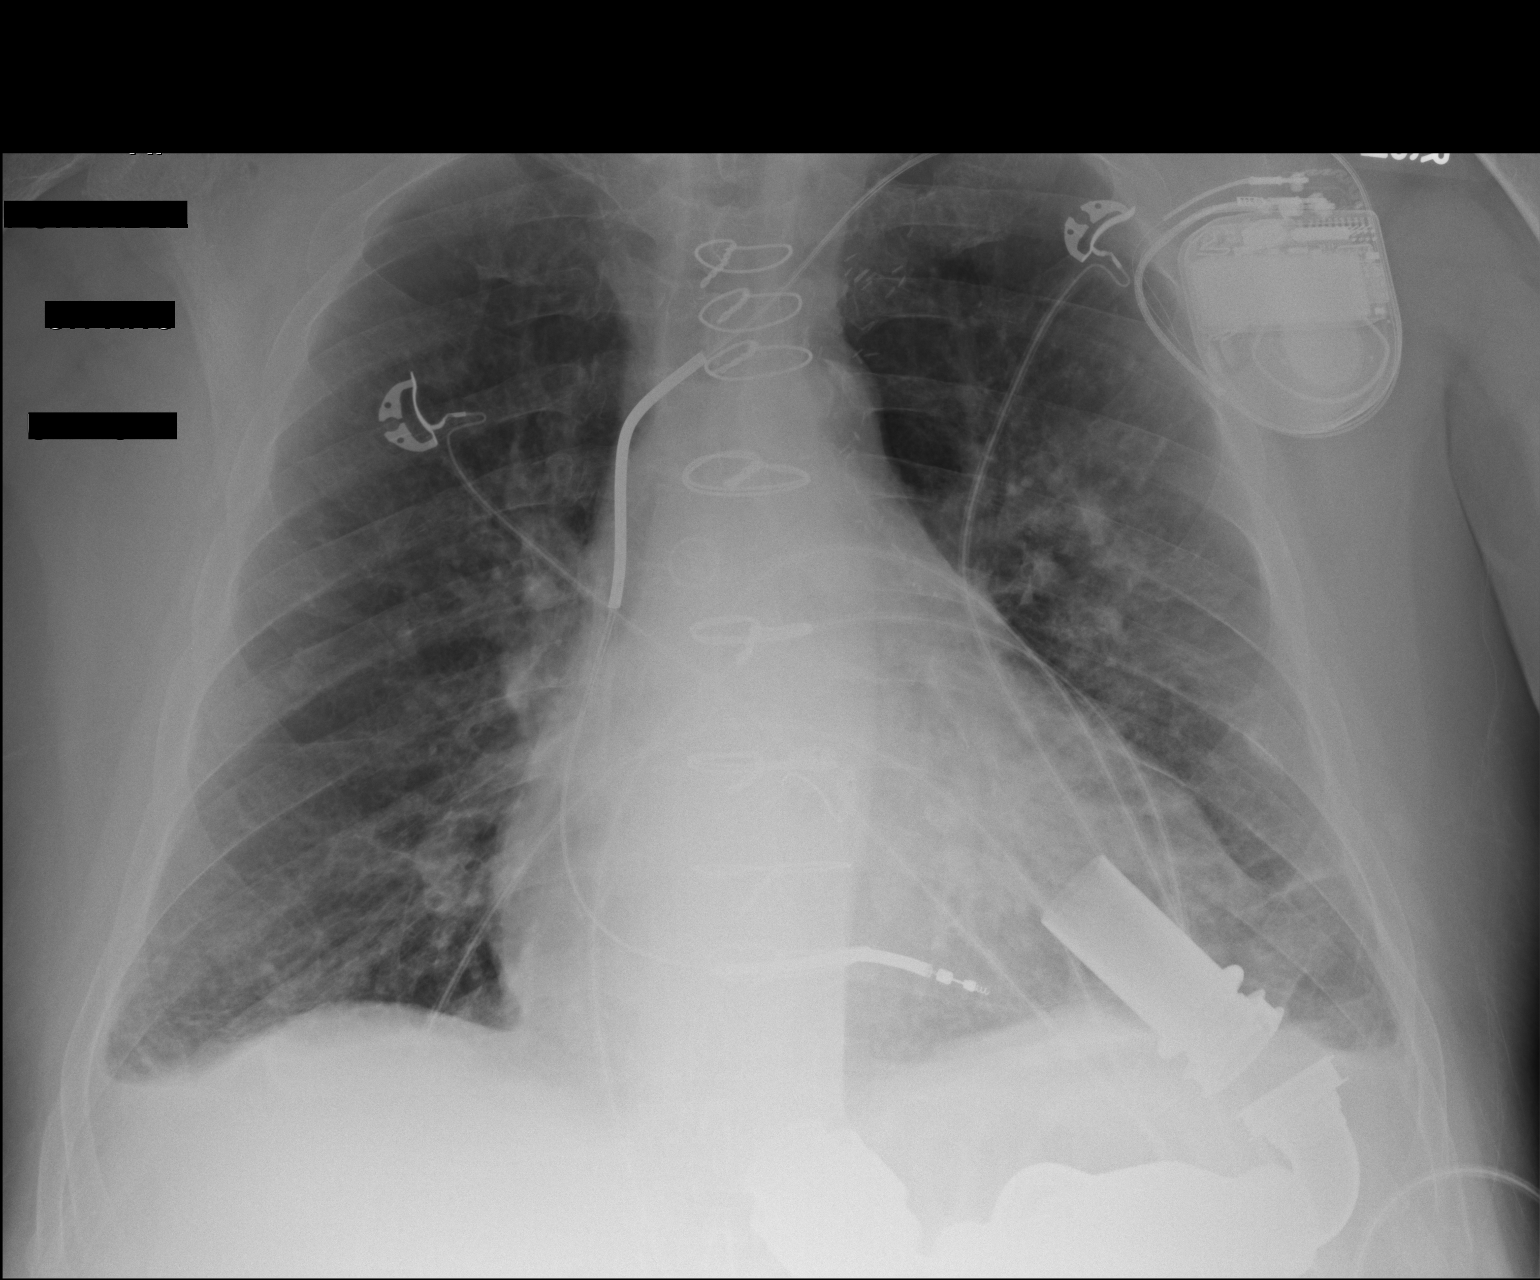

[1 of 1 positions shown; findings below may reference images not displayed]

FINDINGS: Stable cardiomediastinal silhouette. Left ventricular assistance
device is unchanged in position. Status post coronary artery bypass
graft. Left-sided pacemaker is unchanged in position. No
pneumothorax is noted. Minimal pleural effusions are noted
bilaterally. Mild bibasilar subsegmental atelectasis is noted.
Ill-defined opacity is seen in left perihilar region which
potentially may represent early inflammation.
IMPRESSION: Minimal bilateral pleural effusions. Minimal bibasilar subsegmental
atelectasis. Slightly increased ill-defined left perihilar opacity
is noted which may represent developing pneumonia. Followup
radiographs are recommended.

## 2016-06-12 NOTE — Progress Notes (Signed)
Pt has an extensive cardiac history, has had a CABG and has a LVAD. LVAD coordinator (I spoke with Colletta Maryland) is aware of pt coming, pt has stopped Eliquis as instructed (last dose was 06/10/16). Pt's wife states he's not had any recent chest pain or sob. States he goes in and out of A-fib and he feels that he may be in A-fib now.

## 2016-06-13 ENCOUNTER — Encounter (HOSPITAL_COMMUNITY): Payer: Self-pay | Admitting: Anesthesiology

## 2016-06-13 ENCOUNTER — Encounter (HOSPITAL_COMMUNITY)
Admission: RE | Disposition: A | Discharge status: Home / Self Care | Payer: Self-pay | Source: Ambulatory Visit | Attending: Gastroenterology

## 2016-06-13 ENCOUNTER — Ambulatory Visit (HOSPITAL_COMMUNITY): Payer: No Typology Code available for payment source

## 2016-06-13 ENCOUNTER — Ambulatory Visit (HOSPITAL_COMMUNITY): Payer: No Typology Code available for payment source | Admitting: Anesthesiology

## 2016-06-13 ENCOUNTER — Encounter: Payer: Self-pay | Admitting: Unknown Physician Specialty

## 2016-06-13 ENCOUNTER — Ambulatory Visit (HOSPITAL_COMMUNITY)
Admission: RE | Admit: 2016-06-13 | Discharge: 2016-06-13 | Disposition: A | Discharge status: Home / Self Care | Payer: No Typology Code available for payment source | Source: Ambulatory Visit | Attending: Gastroenterology | Admitting: Gastroenterology

## 2016-06-13 DIAGNOSIS — I2510 Atherosclerotic heart disease of native coronary artery without angina pectoris: Secondary | ICD-10-CM | POA: Diagnosis not present

## 2016-06-13 DIAGNOSIS — K8050 Calculus of bile duct without cholangitis or cholecystitis without obstruction: Secondary | ICD-10-CM

## 2016-06-13 DIAGNOSIS — Z87891 Personal history of nicotine dependence: Secondary | ICD-10-CM | POA: Insufficient documentation

## 2016-06-13 DIAGNOSIS — D500 Iron deficiency anemia secondary to blood loss (chronic): Secondary | ICD-10-CM | POA: Diagnosis not present

## 2016-06-13 DIAGNOSIS — I252 Old myocardial infarction: Secondary | ICD-10-CM | POA: Diagnosis not present

## 2016-06-13 DIAGNOSIS — I110 Hypertensive heart disease with heart failure: Secondary | ICD-10-CM | POA: Insufficient documentation

## 2016-06-13 DIAGNOSIS — K831 Obstruction of bile duct: Secondary | ICD-10-CM | POA: Diagnosis not present

## 2016-06-13 DIAGNOSIS — K922 Gastrointestinal hemorrhage, unspecified: Secondary | ICD-10-CM | POA: Diagnosis not present

## 2016-06-13 DIAGNOSIS — J449 Chronic obstructive pulmonary disease, unspecified: Secondary | ICD-10-CM | POA: Insufficient documentation

## 2016-06-13 DIAGNOSIS — Z951 Presence of aortocoronary bypass graft: Secondary | ICD-10-CM | POA: Insufficient documentation

## 2016-06-13 DIAGNOSIS — I509 Heart failure, unspecified: Secondary | ICD-10-CM | POA: Insufficient documentation

## 2016-06-13 DIAGNOSIS — E039 Hypothyroidism, unspecified: Secondary | ICD-10-CM | POA: Diagnosis not present

## 2016-06-13 DIAGNOSIS — G4733 Obstructive sleep apnea (adult) (pediatric): Secondary | ICD-10-CM | POA: Insufficient documentation

## 2016-06-13 DIAGNOSIS — E785 Hyperlipidemia, unspecified: Secondary | ICD-10-CM | POA: Insufficient documentation

## 2016-06-13 DIAGNOSIS — G4730 Sleep apnea, unspecified: Secondary | ICD-10-CM | POA: Insufficient documentation

## 2016-06-13 DIAGNOSIS — Z7901 Long term (current) use of anticoagulants: Secondary | ICD-10-CM | POA: Diagnosis not present

## 2016-06-13 DIAGNOSIS — D696 Thrombocytopenia, unspecified: Secondary | ICD-10-CM | POA: Insufficient documentation

## 2016-06-13 DIAGNOSIS — K8010 Calculus of gallbladder with chronic cholecystitis without obstruction: Secondary | ICD-10-CM | POA: Diagnosis not present

## 2016-06-13 HISTORY — PX: ERCP: SHX5425

## 2016-06-13 HISTORY — DX: Reserved for inherently not codable concepts without codable children: IMO0001

## 2016-06-13 HISTORY — DX: Restless legs syndrome: G25.81

## 2016-06-13 HISTORY — DX: Unspecified cirrhosis of liver: K74.60

## 2016-06-13 SURGERY — ERCP, WITH INTERVENTION IF INDICATED
Anesthesia: General

## 2016-06-13 MED ORDER — LACTATED RINGERS IV SOLN
INTRAVENOUS | Status: DC | PRN
  Administered 2016-06-13: 13:00:00 via INTRAVENOUS

## 2016-06-13 MED ORDER — ETOMIDATE 2 MG/ML IV SOLN
INTRAVENOUS | Status: DC | PRN
  Administered 2016-06-13: 12 mg via INTRAVENOUS

## 2016-06-13 MED ORDER — CIPROFLOXACIN IN D5W 400 MG/200ML IV SOLN
INTRAVENOUS | Status: DC | PRN
  Administered 2016-06-13: 400 mg via INTRAVENOUS

## 2016-06-13 MED ORDER — INDOMETHACIN 50 MG RE SUPP
RECTAL | Status: DC | PRN
  Administered 2016-06-13: 100 mg via RECTAL

## 2016-06-13 MED ORDER — FENTANYL CITRATE (PF) 100 MCG/2ML IJ SOLN
INTRAMUSCULAR | Status: DC | PRN
  Administered 2016-06-13: 50 ug via INTRAVENOUS

## 2016-06-13 MED ORDER — ALBUMIN HUMAN 5 % IV SOLN
INTRAVENOUS | Status: DC | PRN
  Administered 2016-06-13: 14:00:00 via INTRAVENOUS

## 2016-06-13 MED ORDER — GLUCAGON HCL RDNA (DIAGNOSTIC) 1 MG IJ SOLR
INTRAMUSCULAR | Status: AC
  Filled 2016-06-13: qty 1

## 2016-06-13 MED ORDER — LIDOCAINE HCL (CARDIAC) 20 MG/ML IV SOLN
INTRAVENOUS | Status: DC | PRN
  Administered 2016-06-13: 80 mg via INTRAVENOUS

## 2016-06-13 MED ORDER — IOPAMIDOL (ISOVUE-300) INJECTION 61%
INTRAVENOUS | Status: AC
  Filled 2016-06-13: qty 50

## 2016-06-13 MED ORDER — IOPAMIDOL (ISOVUE-300) INJECTION 61%
INTRAVENOUS | Status: DC | PRN
  Administered 2016-06-13: 40 mL

## 2016-06-13 MED ORDER — CIPROFLOXACIN IN D5W 400 MG/200ML IV SOLN
INTRAVENOUS | Status: AC
  Filled 2016-06-13: qty 200

## 2016-06-13 MED ORDER — PROPOFOL 10 MG/ML IV BOLUS
INTRAVENOUS | Status: DC | PRN
  Administered 2016-06-13: 10 mg via INTRAVENOUS

## 2016-06-13 MED ORDER — EPINEPHRINE HCL 1 MG/ML IJ SOLN
0.5000 ug/min | INTRAVENOUS | Status: DC
  Filled 2016-06-13: qty 4

## 2016-06-13 MED ORDER — NOREPINEPHRINE BITARTRATE 1 MG/ML IV SOLN
0.0000 ug/min | INTRAVENOUS | Status: DC
  Filled 2016-06-13: qty 4

## 2016-06-13 MED ORDER — SUGAMMADEX SODIUM 200 MG/2ML IV SOLN
INTRAVENOUS | Status: DC | PRN
  Administered 2016-06-13: 200 mg via INTRAVENOUS

## 2016-06-13 MED ORDER — PHENYLEPHRINE HCL 10 MG/ML IJ SOLN
INTRAVENOUS | Status: DC | PRN
  Administered 2016-06-13: 20 ug/min via INTRAVENOUS

## 2016-06-13 MED ORDER — INDOMETHACIN 50 MG RE SUPP
RECTAL | Status: AC
  Filled 2016-06-13: qty 2

## 2016-06-13 MED ORDER — LACTATED RINGERS IV SOLN
INTRAVENOUS | Status: DC
  Administered 2016-06-13: 12:00:00 via INTRAVENOUS

## 2016-06-13 MED ORDER — SODIUM CHLORIDE 0.9 % IV SOLN: INTRAVENOUS | Status: DC

## 2016-06-13 MED ORDER — ROCURONIUM BROMIDE 100 MG/10ML IV SOLN
INTRAVENOUS | Status: DC | PRN
  Administered 2016-06-13: 50 mg via INTRAVENOUS

## 2016-06-13 NOTE — Progress Notes (Signed)
Patient ID: [name], male   DOB: 23-May-1941, 75 y.o.   MRN: [id number]  VAD Coordinator Procedure Note:   Patient underwent ERCP with Dr. Ardis Hughs. Hemodynamics and VAD parameters monitored by me and anesthesia throughout the procedure. MAPs were obtained with automatic BP cuff and arterial line.        Doppler Auto cuff(MAP):  Flow: PI: Power:     Speed:      Time:          Pre-procedure: 107/92                  4.2            6.9 4.7       8990  1235  Sedation Induction:106/96 (101)       4.2  6.9 4.7       8990  1338   Placed prone:   127/112       3.8  7.0 4.7       8990  1348                                        113/105(108)      3.8  5.3 4.7       9000  1400                                          117/106(112)       3.9  6.4  4.7        8990  1415    Interventions: 2 stones were removed from the bile duct.   Recovery area: 114/98(101)       4.4           6.8    5.0         9000     1439    Patient tolerated the procedure well. PIs were 5.3-7.0 throughout the case with no power elevations. Unable to achieve pulse ox initially-cap refill was brisk. After intubation, maintained >92% throughout the remainder of the procedure. MAPs were 90-100s. Driveline was padded and protected during prone placement. Driveline was secure throughout the entire procedure. Flow dropped slightly once prone but came back to normal after procedure when patient was supine.   Patient Disposition: Pt was extubated and was alert. [name] (caregiver) was at bedside in Endo. Pt will discharge home. And per Dr. Ardis Hughs can resume Eliquis tonight.

## 2016-06-13 NOTE — Anesthesia Postprocedure Evaluation (Signed)
Anesthesia Post Note  Patient: [name]  Procedure(s) Performed: Procedure(s) (LRB): ENDOSCOPIC RETROGRADE CHOLANGIOPANCREATOGRAPHY (ERCP) (N/A)  Patient location during evaluation: PACU Anesthesia Type: General Level of consciousness: awake and alert Pain management: pain level controlled Vital Signs Assessment: post-procedure vital signs reviewed and stable Respiratory status: spontaneous breathing, nonlabored ventilation, respiratory function stable and patient connected to nasal cannula oxygen Cardiovascular status: blood pressure returned to baseline and stable Postop Assessment: no signs of nausea or vomiting Anesthetic complications: no    Last Vitals:  Vitals:   06/13/16 1520 06/13/16 1530  BP: (!) 112/91 (!) 116/94  Pulse: 64 80  Resp: (!) 24 16  Temp:      Last Pain:  Vitals:   06/13/16 1440  TempSrc: Oral                 Traevon Meiring S

## 2016-06-13 NOTE — Anesthesia Preprocedure Evaluation (Addendum)
Anesthesia Evaluation  Patient identified by MRN, date of birth, ID band Patient awake    Reviewed: Allergy & Precautions, NPO status , Patient's Chart, lab work & pertinent test results  Airway Mallampati: II  TM Distance: >3 FB Neck ROM: Full    Dental no notable dental hx.    Pulmonary neg pulmonary ROS, sleep apnea , COPD, former smoker,    Pulmonary exam normal breath sounds clear to auscultation       Cardiovascular hypertension, + CAD, + Past MI and +CHF   Rhythm:Regular Rate:Normal  Has LVAD   Neuro/Psych negative neurological ROS  negative psych ROS   GI/Hepatic negative GI ROS, Neg liver ROS,   Endo/Other  Hypothyroidism   Renal/GU negative Renal ROS  negative genitourinary   Musculoskeletal negative musculoskeletal ROS (+)   Abdominal   Peds negative pediatric ROS (+)  Hematology  (+) anemia ,   Anesthesia Other Findings   Reproductive/Obstetrics negative OB ROS                             Anesthesia Physical Anesthesia Plan  ASA: IV  Anesthesia Plan: General   Post-op Pain Management:    Induction: Intravenous  Airway Management Planned: Oral ETT  Additional Equipment: Arterial line  Intra-op Plan:   Post-operative Plan: Extubation in OR  Informed Consent: I have reviewed the patients History and Physical, chart, labs and discussed the procedure including the risks, benefits and alternatives for the proposed anesthesia with the patient or authorized representative who has indicated his/her understanding and acceptance.   Dental advisory given  Plan Discussed with: CRNA and Surgeon  Anesthesia Plan Comments:         Anesthesia Quick Evaluation

## 2016-06-13 NOTE — Op Note (Signed)
[facility] Patient Name: [name] Procedure Date : 06/13/2016 MRN: [id number] Attending MD: Milus Banister , MD Date of Birth: [birth year] CSN: [id number] Age: 75 Admit Type: Outpatient Procedure:                ERCP Indications:              Abnormal x-ray of gastrointestinal system; LVAD                            patient, recent acute cholecystitis treated with                            perc chole drain; drain check recently showed + CBD                            stone(s) Providers:                Milus Banister, MD, Carolynn Comment, RN, Elspeth Cho Tech., Technician, Luciana Axe, CRNA,                            Alfonso Patten, Technician Referring MD:             Fanny Skates, MD Medicines:                General Anesthesia, Indomethacin 100 mg PR, Cipro                            [id number] mg IV Complications:            No immediate complications. Estimated blood loss:                            None Estimated Blood Loss:     Estimated blood loss: none. Procedure:                Pre-Anesthesia Assessment:                           - Prior to the procedure, a History and Physical                            was performed, and patient medications and                            allergies were reviewed. The patient's tolerance of                            previous anesthesia was also reviewed. The risks                            and benefits of the procedure and the sedation                            options and risks were  discussed with the patient.                            All questions were answered, and informed consent                            was obtained. Prior Anticoagulants: The patient has                            taken Eliquis (apixaban), last dose was 3 days                            prior to procedure. ASA Grade Assessment: IV - A                            patient with severe systemic disease that is a                       constant threat to life. After reviewing the risks                            and benefits, the patient was deemed in                            satisfactory condition to undergo the procedure.                           After obtaining informed consent, the scope was                            passed under direct vision. Throughout the                            procedure, the patient's blood pressure, pulse, and                            oxygen saturations were monitored continuously. The                            WI-[id number] ([id number]) scope was introduced through                            the mouth, and used to inject contrast into and                            used to inject contrast into the bile duct. The                            ERCP was accomplished without difficulty. The                            patient tolerated the procedure well. Scope In: Scope Out: Findings:      The scout film showed LVAD and perc chole drain in place. The  esophagus       was successfully intubated under direct vision. The scope was advanced       to a normal major papilla in the descending duodenum without detailed       examination of the pharynx, larynx and associated structures, and upper       GI tract. The upper GI tract was grossly normal. A 44 Autotome over a       .035 hydrawire was used to cannulate the bile duct and contrast was       injected. The extrahepatic biliary tree was slightly dilated (CBD 68mm)       and there were two small mobile filling defects. There were no       strictures. The cystic duct did not opacify. An adequate biliary       sphincterotomy was made with a traction (standard) sphincterotome. There       was no post-sphincterotomy bleeding. The biliary tree was swept with a       15 mm balloon starting at the upper third of the main bile duct. Two       small, smooth, black stones were delivered into the duodenum along with       copious greenish  biodebris. There was no purulence. A completion,       occlusion cholangiogram showed no persistent filling defects and the       scope was removed. The main pancreatic duct was briefly cannulated with       a wire but never injected with dye. Impression:               - Choledocholithiasis, treated with biliary                            sphincterotomy and stone removal.                           - LVAD                           - Percutaneous cholecystotomy drain in place. Recommendation:           - Discharge patient to home (ambulatory).                           - Follow up with Dr. Dalbert Batman in next few weeks to                            discuss timing of drain removal.                           - OK to restart your blood thinners today. Procedure Code(s):        --- Professional ---                           [phone], Endoscopic retrograde                            cholangiopancreatography (ERCP); with removal of                            calculi/debris from  biliary/pancreatic duct(s)                           [id number], Endoscopic retrograde                            cholangiopancreatography (ERCP); with                            sphincterotomy/papillotomy Diagnosis Code(s):        --- Professional ---                           K80.50, Calculus of bile duct without cholangitis                            or cholecystitis without obstruction                           R93.3, Abnormal findings on diagnostic imaging of                            other parts of digestive tract CPT copyright 2016 [employer]. All rights reserved. The codes documented in this report are preliminary and upon coder review may  be revised to meet current compliance requirements. Milus Banister, MD 06/13/2016 2:24:13 PM This report has been signed electronically. Number of Addenda: 0

## 2016-06-13 NOTE — H&P (View-Only) (Signed)
[facility]  [phone]) [phone] [phone]) [phone]  Clinic Follow Up Note   Patient Care Team: Thressa Sheller, MD as PCP - General (Internal Medicine) Jolaine Artist, MD as Attending Physician (Cardiology) Ivin Poot, MD as Attending Physician (Cardiothoracic Surgery) 06/04/2016  CHIEF COMPLAINTS:  Follow up anemia  HISTORY OF PRESENTING ILLNESS (11/01/2014):  [name] 75 y.o. male is here because of anemia.   He has extensive cardiac history. He had multiple heart attacks in the past 15 years, he had cardiac bypass surgery in 2003, PCI in 2007, long-standing history of , a atrial fibrillationnd was found to have ischemic cardiomyopathy with EF 27% in 2012.  He initially had AICD placed, and finally had LVAD placed in 2014 for his end-stage heart failure. He has been on Coumadin for more than 10 years for the cardiac issues.   He reports he has had anemia for several years, he used to take iron pill. He also received some IV iron a few years ago. In our electronic medical records, his lab showed anemia since 12/2012.   He had 3 episodes of GI bleeding since June of 2015 which are required blood transfusion, last episode was one month ago and he required 4u RBC  at that time. His EGD showed area of bleeding from duodenum all jejunum and he was treated endoscopyly.   He feels well overall. He denies significant chest pain or abdominal discomfort. He has mild dyspnea on moderate exertion. He has mild to moderate fatigue, but is able to function well at home,  does all his ADLs and daily activities.  TREATMENT: 1. IV Feraheme 510 mg as needed, received in 10/2014, 01/2015, 03/2015, 06/2015, 08/2015, 10/2015, 11/2015, 12/22/2015, 01/22/2016, 01/29/2016, 03/12/16, 03/22/2016, 04/26/16, blood transfusion as needed if Hb<9 2. 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 He has been stable lately, no overt GI bleeding or hospitalization in the past few months. He still has the percutaneous biliary drain tube, which is not draining any more. His wife has been flushes with saline. He denies any significant pain, eating well. He is scheduled to have a procedure with Dr. Ardis Hughs on 06/13/2016. He is going to see Dr. Donne Hazel for a second surgical opinion about cholecystectomy. He denies any melena, hematochezia, or other bleeding. No other new complaints  MEDICAL HISTORY:  Past Medical History  Diagnosis Date  . Ischemic cardiomyopathy      CABG 2003, PCI 2007  EF 27%(myoview 2012  . Chronic systolic heart failure   . Hyperlipidemia   . Hypothyroidism   . Chronic anticoagulation     Afib and LVAD in 2014   . Obesity   . COPD (chronic obstructive pulmonary disease)   . Asbestosis(501)     "6 years in the [place]" (05/24/2013)  . Atrial fibrillation     permanent  . Paroxysmal ventricular tachycardia   . Coronary artery disease   . Implantable cardioverter-defibrillator [employer]   . Hypertension   . Myocardial infarction 1990's-2000    "2 in  ~ the 1990's; 1 in ~ 2000" (05/24/2013)  . OSA on CPAP   . Depression   . LVAD (left ventricular assist device) present 12/2012    SURGICAL HISTORY: Past Surgical History:  Procedure Laterality Date  . APPENDECTOMY    . CARDIAC DEFIBRILLATOR PLACEMENT  2004; ~ 2010   "cut it out in 2016  after it got infected"   . COLONOSCOPY N/A 03/09/2014   Procedure: COLONOSCOPY;  Surgeon: Inda Castle, MD;  Location: [place];  Service: Endoscopy;  Laterality: N/A;  LVAD  patient  . COLONOSCOPY N/A 07/29/2014   Procedure: COLONOSCOPY;  Surgeon: Gatha Mayer, MD;  Location: [place];  Service: Endoscopy;  Laterality: N/A;  . CORONARY ARTERY BYPASS GRAFT  2003   "?X4" (05/24/2013)  . ENTEROSCOPY N/A 07/27/2014   Procedure: ENTEROSCOPY;  Surgeon: Gatha Mayer, MD;  Location: [facility] ENDOSCOPY;  Service:  Endoscopy;  Laterality: N/A;  LVAD patient   . ENTEROSCOPY N/A 09/27/2014   Procedure: ENTEROSCOPY;  Surgeon: Gatha Mayer, MD;  Location: [facility] ENDOSCOPY;  Service: Endoscopy;  Laterality: N/A;  . ENTEROSCOPY N/A 11/09/2015   Procedure: ENTEROSCOPY;  Surgeon: Mauri Pole, MD;  Location: [place] ENDOSCOPY;  Service: Endoscopy;  Laterality: N/A;  LVAD  . ENTEROSCOPY N/A 12/13/2015   Procedure: ENTEROSCOPY;  Surgeon: Milus Banister, MD;  Location: [place];  Service: Endoscopy;  Laterality: N/A;  . ENTEROSCOPY N/A 02/21/2016   Procedure: ENTEROSCOPY;  Surgeon: Milus Banister, MD;  Location: [place];  Service: Endoscopy;  Laterality: N/A;  . ESOPHAGOGASTRODUODENOSCOPY N/A 03/09/2014   Procedure: ESOPHAGOGASTRODUODENOSCOPY (EGD);  Surgeon: Inda Castle, MD;  Location: [place];  Service: Endoscopy;  Laterality: N/A;  . ESOPHAGOGASTRODUODENOSCOPY N/A 08/01/2014   Procedure: ESOPHAGOGASTRODUODENOSCOPY (EGD);  Surgeon: Jerene Bears, MD;  Location[facility] ENDOSCOPY;  Service: Endoscopy;  Laterality: N/A;  LVAD patient  . GIVENS CAPSULE STUDY N/A 03/10/2014   Procedure: GIVENS CAPSULE STUDY;  Surgeon: Inda Castle, MD;  Location: [place];  Service: Endoscopy;  Laterality: N/A;  . GIVENS CAPSULE STUDY N/A 07/30/2014   Procedure: GIVENS CAPSULE STUDY;  Surgeon: Gatha Mayer, MD;  Location: [place];  Service: Endoscopy;  Laterality: N/A;  . GIVENS CAPSULE STUDY N/A 12/11/2015   Procedure: GIVENS CAPSULE STUDY;  Surgeon: Gatha Mayer, MD;  Location: [place];  Service: Endoscopy;  Laterality: N/A;  . ICD LEAD REMOVAL Left 03/16/2015   Procedure: ICD LEAD REMOVAL/EXTRACTION;  Surgeon: Evans Lance, MD;  Location: [place];  Service: Cardiovascular;  Laterality: Left;  PATIENT HAS LVAD  DR. VAN TRIGT TO BACK UP EXTRACTION  . INSERTION OF IMPLANTABLE LEFT VENTRICULAR ASSIST DEVICE N/A 01/12/2013   Procedure: INSERTION OF IMPLANTABLE LEFT VENTRICULAR ASSIST DEVICE;  Surgeon: Ivin Poot, MD;  Location: [place];  Service: Open Heart Surgery;  Laterality: N/A;   nitric oxide; Redo sternotomy  . INTRAOPERATIVE TRANSESOPHAGEAL ECHOCARDIOGRAM N/A 01/12/2013   Procedure: INTRAOPERATIVE TRANSESOPHAGEAL ECHOCARDIOGRAM;  Surgeon: Ivin Poot, MD;  Location: [place];  Service: Open Heart Surgery;  Laterality: N/A;  . LEFT AND RIGHT HEART CATHETERIZATION WITH CORONARY/GRAFT ANGIOGRAM  01/07/2013   Procedure: LEFT AND RIGHT HEART CATHETERIZATION WITH Beatrix Fetters;  Surgeon: Jolaine Artist, MD;  Location[facility] CATH LAB;  Service: Cardiovascular;;    SOCIAL HISTORY: Social History   Social History  . Marital status: Married    Spouse name: N/A  . Number of children: N/A  . Years of education: N/A   Occupational History  . Not on file.   Social History Main Topics  . Smoking status: Former Smoker    Packs/day: 2.00    Years: 45.00    Types: Cigarettes    Quit date: 11/11/2001  . Smokeless tobacco: Never Used  . Alcohol use Yes     Comment: 05/24/2013 "use to drink beer; hardly nothing since  2003; nothing at all since 12/2011 when I got my LVAD  "  . Drug use: No  . Sexual activity: No   Other Topics Concern  . Not on file   Social History Narrative  . No narrative on file    FAMILY HISTORY: Family History  Problem Relation Age of Onset  . Heart attack Mother   . Heart attack Father     ALLERGIES:  is allergic to tape.  MEDICATIONS:  Current Outpatient Prescriptions  Medication Sig Dispense Refill  . albuterol (PROVENTIL) (2.5 MG/3ML) 0.083% nebulizer solution Take 2.5 mg by nebulization every 6 (six) hours as needed for wheezing or shortness of breath.    [name] amiodarone (PACERONE) 200 MG tablet Take 1 tablet (200 mg total) by mouth every evening. 90 tablet 3  . apixaban (ELIQUIS) 5 MG TABS tablet Take 1 tablet (5 mg total) by mouth 2 (two) times daily. 60 tablet 11  . budesonide-formoterol (SYMBICORT) 160-4.5 MCG/ACT inhaler Inhale 2 puffs into  the lungs 2 (two) times daily. 1 Inhaler 3  . cholecalciferol (VITAMIN D) 1000 UNITS tablet Take 1 tablet (1,000 Units total) by mouth 2 (two) times daily. 180 tablet 3  . citalopram (CELEXA) 40 MG tablet Take 1 tablet (40 mg total) by mouth at bedtime. 90 tablet 3  . docusate sodium (COLACE) 100 MG capsule Take 100 mg by mouth 2 (two) times daily.     . furosemide (LASIX) 40 MG tablet Use only if a weight gain of 5 pounds in a week occurs or evident swelling is present 30 tablet 4  . hydrocortisone (ANUSOL-HC) 2.5 % rectal cream Place 1 application rectally 2 (two) times daily. 30 g 6  . levothyroxine (SYNTHROID, LEVOTHROID) 100 MCG tablet Take 1 tablet (100 mcg total) by mouth at bedtime. 30 tablet 6  . losartan (COZAAR) 25 MG tablet Take 25 mg by mouth at bedtime.    . Multiple Vitamin (MULTIVITAMIN WITH MINERALS) TABS tablet Take 1 tablet by mouth daily.    . nitroGLYCERIN (NITROSTAT) 0.4 MG SL tablet Place 0.4 mg under the tongue every 5 (five) minutes as needed for chest pain. Reported on 04/09/2016    . pantoprazole (PROTONIX) 40 MG tablet Take 1 tablet (40 mg total) by mouth 2 (two) times daily. 180 tablet 3  . potassium chloride SA (K-DUR,KLOR-CON) 20 MEQ tablet Take 2 tablets (40 mEq total) by mouth daily. And take extra 1 pill with lasix when needed. 80 tablet 4  . PRESCRIPTION MEDICATION Ferumoxytol (FERAHEME) 510 mg in sodium chloride 0.9 % 100 mL IVPB (Once) Route: Intravenous    . PRESCRIPTION MEDICATION Octreotide (SANDOSTATIN LAR) IM injection 20 mg (Once)Route: Intramuscular    . simvastatin (ZOCOR) 80 MG tablet Take 0.5 tablets (40 mg total) by mouth at bedtime. 45 tablet 3  . tiotropium (SPIRIVA) 18 MCG inhalation capsule Place 1 capsule (18 mcg total) into inhaler and inhale daily. 90 capsule 3  . vitamin B-12 (CYANOCOBALAMIN) 1000 MCG tablet Take 1,000 mcg by mouth at bedtime.    . vitamin C (ASCORBIC ACID) 500 MG tablet Take 1 tablet (500 mg total) by mouth 2 (two) times daily.  180 tablet 3   No current facility-administered medications for this visit.     REVIEW OF SYSTEMS:   Constitutional: Denies fevers, chills or abnormal night sweats Eyes: Denies blurriness of vision, double vision or watery eyes Ears, nose, mouth, throat, and face: Denies mucositis or sore throat Respiratory: Denies cough, dyspnea or wheezes Cardiovascular: Denies  palpitation, chest discomfort or lower extremity swelling Gastrointestinal:  Denies nausea, heartburn or change in bowel habits Skin: Denies abnormal skin rashes Lymphatics: Denies new lymphadenopathy or easy bruising Neurological:Denies numbness, tingling or new weaknesses Behavioral/Psych: Mood is stable, no new changes  All other systems were reviewed with the patient and are negative.  PHYSICAL EXAMINATION: ECOG PERFORMANCE STATUS: 1 - Symptomatic but completely ambulatory  Vitals:   06/04/16 1051 06/04/16 1055  BP: 93/80 99/85  Pulse: 80 82  Resp: 17   Temp: 97.7 F (36.5 C)    Filed Weights   06/04/16 1051  Weight: 210 lb 14.4 oz (95.7 kg)    GENERAL:alert, no distress and comfortable SKIN: skin color, texture, turgor are normal, no rashes or significant lesions EYES: normal, conjunctiva are pink and non-injected, sclera clear OROPHARYNX:no exudate, no erythema and lips, buccal mucosa, and tongue normal  NECK: supple, thyroid normal size, non-tender, without nodularity LYMPH:  no palpable lymphadenopathy in the cervical, axillary or inguinal LUNGS: clear to auscultation and percussion with normal breathing effort HEART: (+) LVAD blood flow   ABDOMEN:abdomen soft, non-tender and normal bowel sounds, (+) percutaneous biliary drainage tube in the right upper quadrant of abdomen Musculoskeletal:no cyanosis of digits and no clubbing  PSYCH: alert & oriented x 3 with fluent speech NEURO: no focal motor/sensory deficits  LABORATORY DATA:  I have reviewed the data as listed CBC Latest Ref Rng & Units 06/04/2016  05/27/2016 05/21/2016  WBC 4.0 - 10.3 10e3/uL 8.1 6.0 4.9  Hemoglobin 13.0 - 17.1 g/dL 11.2(L) 11.4(L) 10.8(L)  Hematocrit 38.4 - 49.9 % 36.5(L) 38.2(L) 35.2(L)  Platelets 140 - 400 10e3/uL 106(L) 121(L) 117(L)    CMP Latest Ref Rng & Units 06/04/2016 05/27/2016 04/30/2016  Glucose 70 - 140 mg/dl 121 117(H) 119(H)  BUN 7.0 - 26.0 mg/dL 12.0 13 8  Creatinine 0.7 - 1.3 mg/dL 0.8 1.08 1.03  Sodium 136 - 145 mEq/L 143 139 144  Potassium 3.5 - 5.1 mEq/L 4.6 4.1 2.6(LL)  Chloride 101 - 111 mmol/L - 112(H) 114(H)  CO2 22 - 29 mEq/L 24 22 26   Calcium 8.4 - 10.4 mg/dL 9.3 9.3 8.2(L)  Total Protein 6.4 - 8.3 g/dL 6.6 6.3(L) 5.2(L)  Total Bilirubin 0.20 - 1.20 mg/dL 1.19 0.8 0.4  Alkaline Phos 40 - 150 U/L 90 71 75  AST 5 - 34 U/L 34 41 28  ALT 0 - 55 U/L 17 20 15(L)   Results for OLA, LEEDER (MRN [id number]) as of 06/04/2016 11:22  Ref. Range 05/07/2016 08:59 05/07/2016 08:59 06/04/2016 09:37  Iron Latest Ref Range: 42 - 163 ug/dL 29 (L)    UIBC Latest Ref Range: 117 - 376 ug/dL 212    TIBC Latest Ref Range: 202 - 409 ug/dL 241    %SAT Latest Ref Range: 20 - 55 % 12 (L)    Ferritin Latest Ref Range: 22 - 316 ng/ml  195 60   RADIOGRAPHIC STUDIES: I have personally reviewed the radiological images as listed and agreed with the findings in the report. Dg Sinus/fist Tube Chk-non Gi  Result Date: 05/14/2016 CLINICAL DATA:  Acute calculus cholecystitis, status post percutaneous cholecystostomy EXAM: ABSCESS INJECTION CONTRAST:  10 cc Omnipaque 300 FLUOROSCOPY TIME:  Radiation Exposure Index (as provided by the fluoroscopic device): 109 dGycm2 If the device does not provide the exposure index: Fluoroscopy Time (in minutes and seconds):  2 minutes 18 seconds Number of Acquired Images:  8 COMPARISON:  02/29/2016 FINDINGS: Contrast injection performed of the existing cholecystostomy.  Cholangiogram: Initially the cholecystostomy was partially occluded. With pressure injection the tube obstruction was relieved.  Small filling defects within the collapsed gallbladder noted compatible with cholelithiasis. Cystic duct and common bile duct are patent. Contrast drains into the duodenum. Persistent small nonobstructing filling defect in the distal CBD compatible with choledocholithiasis. IMPRESSION: Patent cystic duct and common bile duct. Cholelithiasis Distal CBD nonobstructing choledocholithiasis. Electronically Signed   By: Jerilynn Mages.  Shick M.D.   On: 05/14/2016 10:34    ASSESSMENT & PLAN:  75 year old Caucasian male, with extensive cardiac history, including CAD, ischemic cardiomyopathy status post LVAD, on long-term Coumadin, presented with multiple episodes of GI bleeding and anemia.  1. 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 -His iron studies showed low ferritin level and serum iron, consistent with iron deficient anemia. This is secondary to his GI bleeding. -He responded to IV iron very well. 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  -His Coumadin has been changed to Eliquis now  -Continue Feraheme if ferritin less than 100, I'll set up infusion appointment if needed -He will also receive red blood cell if his hemoglobin less than 9  -Continue IM Sandostatin for his GI bleeding from AVM, recommended by his cardiologist Dr. Haroldine Laws  -His ferritin today is 60, I'll set up weekly Feraheme twice for the next few weeks -Continue labs every 2 weeks  2. Mild thrombocytopenia -Secondary to LVAD -Plt count has been stable   3. chronic cholecystitis -Status post percutaneous bile drainage -He is scheduled to have a procedure with Dr. Ardis Hughs on September 14, and see Dr. Donne Hazel for second opinion of cholecystectomy  3. He will continue to follow-up with his primary care physician and cardiologist for his HTN, heart disease,  hypothyroidism, OSA and mild liver cirrhosis..  Plan -Repeat CBC and ferritin every 2 weeks and iron study monthly  Here -IV Feraheme  510mg  X2 in the next 2 week, and continue if ferritin <100 or low serum iron. I asked the patient to call me the day after his lab test, to see if he needs IV Feraheme. -2u RBC if Hb<9.0  -continuet Sandostatin injection every 4 weeks -I'll see him back in 8 weeks  All questions were answered. The patient knows to call the clinic with any problems, questions or concerns.  I spent 20 minutes counseling the patient face to face. The total time spent in the appointment was 25 minutes and more than 50% was on counseling.     Truitt Merle, MD 06/04/2016

## 2016-06-13 NOTE — Progress Notes (Signed)
1.5cm x 1.5cm skin tear found on patient's left elbow after ERCP completed.  Cleaned site and applied allevyn dressing.  Pt and family notified.    Vista Lawman, RN

## 2016-06-13 NOTE — Anesthesia Procedure Notes (Signed)
Procedure Name: Intubation Date/Time: 06/13/2016 1:35 PM Performed by: Jenne Campus Pre-anesthesia Checklist: Patient identified, Emergency Drugs available, Suction available and Patient being monitored Patient Re-evaluated:Patient Re-evaluated prior to inductionOxygen Delivery Method: Circle System Utilized Preoxygenation: Pre-oxygenation with 100% oxygen Intubation Type: IV induction Ventilation: Mask ventilation without difficulty Laryngoscope Size: Miller and 3 Grade View: Grade I Tube type: Oral Tube size: 7.5 mm Number of attempts: 1 Airway Equipment and Method: Stylet and Oral airway Placement Confirmation: ETT inserted through vocal cords under direct vision,  positive ETCO2 and breath sounds checked- equal and bilateral Secured at: 21 cm Tube secured with: Tape Dental Injury: Teeth and Oropharynx as per pre-operative assessment

## 2016-06-13 NOTE — Discharge Instructions (Signed)
YOU HAD AN ENDOSCOPIC PROCEDURE TODAY: Refer to the procedure report that was given to you for any specific questions about what was found during the examination.  If the procedure report does not answer your questions, please call your gastroenterologist to clarify. ° °YOU SHOULD EXPECT: Some feelings of bloating in the abdomen. Passage of more gas than usual.  Walking can help get rid of the air that was put into your GI tract during the procedure and reduce the bloating. If you had a lower endoscopy (such as a colonoscopy or flexible sigmoidoscopy) you may notice spotting of blood in your stool or on the toilet paper.  ° °DIET: Your first meal following the procedure should be a light meal and then it is ok to progress to your normal diet.  A half-sandwich or bowl of soup is an example of a good first meal.  Heavy or fried foods are harder to digest and may make you feel nasueas or bloated.  Drink plenty of fluids but you should avoid alcoholic beverages for 24 hours. ° °ACTIVITY: Your care partner should take you home directly after the procedure.  You should plan to take it easy, moving slowly for the rest of the day.  You can resume normal activity the day after the procedure however you should NOT DRIVE or use heavy machinery for 24 hours (because of the sedation medicines used during the test).   ° °SYMPTOMS TO REPORT IMMEDIATELY  °A gastroenterologist can be reached at any hour.  Please call your doctor's office for any of the following symptoms: ° °· Following upper endoscopy (EGD, EUS, ERCP) ° Vomiting of blood or coffee ground material ° New, significant abdominal pain ° New, significant chest pain or pain under the shoulder blades ° Painful or persistently difficult swallowing ° New shortness of breath ° Black, tarry-looking stools ° °FOLLOW UP: °If any biopsies were taken you will be contacted by phone or by letter within the next 1-3 weeks.  Call your gastroenterologist if you have not heard about the  biopsies in 3 weeks.  °Please also call your gastroenterologist's office with any specific questions about appointments or follow up tests. ° °

## 2016-06-13 NOTE — Interval H&P Note (Signed)
History and Physical Interval Note:  06/13/2016 12:20 PM  [name]  has presented today for surgery, with the diagnosis of CBD stones  The various methods of treatment have been discussed with the patient and family. After consideration of risks, benefits and other options for treatment, the patient has consented to  Procedure(s): ENDOSCOPIC RETROGRADE CHOLANGIOPANCREATOGRAPHY (ERCP) (N/A) as a surgical intervention .  The patient's history has been reviewed, patient examined, no change in status, stable for surgery.  I have reviewed the patient's chart and labs.  Questions were answered to the patient's satisfaction.     Milus Banister

## 2016-06-13 NOTE — Transfer of Care (Signed)
Immediate Anesthesia Transfer of Care Note  Patient: [name]  Procedure(s) Performed: Procedure(s): ENDOSCOPIC RETROGRADE CHOLANGIOPANCREATOGRAPHY (ERCP) (N/A)  Patient Location: Endoscopy Unit  Anesthesia Type:General  Level of Consciousness: awake, oriented and patient cooperative  Airway & Oxygen Therapy: Patient Spontanous Breathing and Patient connected to nasal cannula oxygen  Post-op Assessment: Report given to RN and Post -op Vital signs reviewed and stable  Post vital signs: Reviewed  Last Vitals:  Vitals:   06/13/16 1128 06/13/16 1440  BP: 98/84 (!) 114/98  Pulse: 74 67  Resp: (!) 24 (!) 24  Temp: 36.6 C 36.6 C    Last Pain:  Vitals:   06/13/16 1440  TempSrc: Oral         Complications: No apparent anesthesia complications

## 2016-06-14 ENCOUNTER — Ambulatory Visit (HOSPITAL_BASED_OUTPATIENT_CLINIC_OR_DEPARTMENT_OTHER): Payer: No Typology Code available for payment source

## 2016-06-14 VITALS — BP 101/86 | HR 64 | Temp 99.7°F | Resp 16

## 2016-06-14 DIAGNOSIS — D500 Iron deficiency anemia secondary to blood loss (chronic): Secondary | ICD-10-CM | POA: Diagnosis not present

## 2016-06-14 DIAGNOSIS — K922 Gastrointestinal hemorrhage, unspecified: Secondary | ICD-10-CM

## 2016-06-14 DIAGNOSIS — D62 Acute posthemorrhagic anemia: Secondary | ICD-10-CM

## 2016-06-14 MED ORDER — SODIUM CHLORIDE 0.9 % IV SOLN
510.0000 mg | Freq: Once | INTRAVENOUS | Status: AC
  Administered 2016-06-14: 510 mg via INTRAVENOUS
  Filled 2016-06-14: qty 17

## 2016-06-14 NOTE — Patient Instructions (Signed)

## 2016-06-19 ENCOUNTER — Encounter (HOSPITAL_COMMUNITY): Payer: Self-pay | Admitting: Infectious Diseases

## 2016-06-19 ENCOUNTER — Telehealth (HOSPITAL_COMMUNITY): Payer: Self-pay | Admitting: Infectious Diseases

## 2016-06-19 ENCOUNTER — Other Ambulatory Visit (HOSPITAL_COMMUNITY): Payer: Self-pay | Admitting: Infectious Diseases

## 2016-06-19 ENCOUNTER — Encounter (HOSPITAL_COMMUNITY): Payer: Self-pay

## 2016-06-19 ENCOUNTER — Ambulatory Visit (HOSPITAL_COMMUNITY)
Admission: RE | Admit: 2016-06-19 | Discharge: 2016-06-19 | Disposition: A | Discharge status: Home / Self Care | Payer: Medicare Other | Source: Ambulatory Visit | Attending: Internal Medicine | Admitting: Internal Medicine

## 2016-06-19 VITALS — BP 117/54 | HR 78 | Ht 72.0 in | Wt 206.0 lb

## 2016-06-19 DIAGNOSIS — K7460 Unspecified cirrhosis of liver: Secondary | ICD-10-CM | POA: Insufficient documentation

## 2016-06-19 DIAGNOSIS — E669 Obesity, unspecified: Secondary | ICD-10-CM | POA: Diagnosis not present

## 2016-06-19 DIAGNOSIS — J449 Chronic obstructive pulmonary disease, unspecified: Secondary | ICD-10-CM | POA: Insufficient documentation

## 2016-06-19 DIAGNOSIS — I480 Paroxysmal atrial fibrillation: Secondary | ICD-10-CM | POA: Insufficient documentation

## 2016-06-19 DIAGNOSIS — Z7901 Long term (current) use of anticoagulants: Secondary | ICD-10-CM

## 2016-06-19 DIAGNOSIS — F329 Major depressive disorder, single episode, unspecified: Secondary | ICD-10-CM | POA: Insufficient documentation

## 2016-06-19 DIAGNOSIS — I2510 Atherosclerotic heart disease of native coronary artery without angina pectoris: Secondary | ICD-10-CM | POA: Diagnosis not present

## 2016-06-19 DIAGNOSIS — I255 Ischemic cardiomyopathy: Secondary | ICD-10-CM | POA: Diagnosis not present

## 2016-06-19 DIAGNOSIS — E039 Hypothyroidism, unspecified: Secondary | ICD-10-CM | POA: Insufficient documentation

## 2016-06-19 DIAGNOSIS — K811 Chronic cholecystitis: Secondary | ICD-10-CM | POA: Diagnosis not present

## 2016-06-19 DIAGNOSIS — Z79899 Other long term (current) drug therapy: Secondary | ICD-10-CM | POA: Diagnosis not present

## 2016-06-19 DIAGNOSIS — G4733 Obstructive sleep apnea (adult) (pediatric): Secondary | ICD-10-CM | POA: Insufficient documentation

## 2016-06-19 DIAGNOSIS — Z95811 Presence of heart assist device: Secondary | ICD-10-CM | POA: Diagnosis present

## 2016-06-19 DIAGNOSIS — K8010 Calculus of gallbladder with chronic cholecystitis without obstruction: Secondary | ICD-10-CM

## 2016-06-19 DIAGNOSIS — I110 Hypertensive heart disease with heart failure: Secondary | ICD-10-CM | POA: Insufficient documentation

## 2016-06-19 DIAGNOSIS — I5022 Chronic systolic (congestive) heart failure: Secondary | ICD-10-CM | POA: Insufficient documentation

## 2016-06-19 DIAGNOSIS — E785 Hyperlipidemia, unspecified: Secondary | ICD-10-CM | POA: Diagnosis not present

## 2016-06-19 DIAGNOSIS — R0902 Hypoxemia: Secondary | ICD-10-CM | POA: Insufficient documentation

## 2016-06-19 DIAGNOSIS — G2581 Restless legs syndrome: Secondary | ICD-10-CM | POA: Diagnosis not present

## 2016-06-19 DIAGNOSIS — Z9981 Dependence on supplemental oxygen: Secondary | ICD-10-CM | POA: Diagnosis not present

## 2016-06-19 LAB — BASIC METABOLIC PANEL
ANION GAP: 4 — AB (ref 5–15)
BUN: 17 mg/dL (ref 6–20)
CALCIUM: 9.8 mg/dL (ref 8.9–10.3)
CO2: 28 mmol/L (ref 22–32)
Chloride: 108 mmol/L (ref 101–111)
Creatinine, Ser: 0.92 mg/dL (ref 0.61–1.24)
GFR calc Af Amer: >60 mL/min (ref >60)
GFR calc non Af Amer: >60 mL/min (ref >60)
GLUCOSE: 101 mg/dL — AB (ref 65–99)
Potassium: 4.6 mmol/L (ref 3.5–5.1)
Sodium: 140 mmol/L (ref 135–145)

## 2016-06-19 LAB — CBC
HEMATOCRIT: 34.4 % — AB (ref 39.0–52.0)
HEMOGLOBIN: 9.8 g/dL — AB (ref 13.0–17.0)
MCH: 29 pg (ref 26.0–34.0)
MCHC: 28.5 g/dL — AB (ref 30.0–36.0)
MCV: 101.8 fL — AB (ref 78.0–100.0)
Platelets: 146 10*3/uL — ABNORMAL LOW (ref 150–400)
RBC: 3.38 MIL/uL — ABNORMAL LOW (ref 4.22–5.81)
RDW: 18.7 % — ABNORMAL HIGH (ref 11.5–15.5)
WBC: 6.1 10*3/uL (ref 4.0–10.5)

## 2016-06-19 LAB — LACTATE DEHYDROGENASE: LDH: 248 U/L — AB (ref 98–192)

## 2016-06-19 LAB — T4, FREE: Free T4: 1.04 ng/dL (ref 0.61–1.12)

## 2016-06-19 LAB — TSH: TSH: 3.937 u[IU]/mL (ref 0.350–4.500)

## 2016-06-19 NOTE — Progress Notes (Signed)
Symptom  Yes  No  Details   Angina         x Activity:   Claudication                x How far:   Syncope         x When:   Stroke         x   Orthopnea         x How many pillows:   2 for comfort  PND         x How often:  CPAP  x  How many hrs:  All night; no O2  Pedal edema         x   Abd fullness         x   N&V         x   Diaphoresis                x When:   Bleeding               x   Urine color   medium yellow  SOB               x Activity:    Palpitations         x When:  ICD shock         N/A   Hospitlizaitons   x           When/where/why: ERCP with Ardis Hughs 06/13/16  ED visit               x When/where/why:   Other MD   x           When/who/why: Burr Medico 06/14/16 iron infusion   Activity    No formal activity; sedentary.   Fluid    No limitations  Diet    No limitations   Vital signs: HR: 72 Doppler modified systolic: 84 Auto cuff: A999333 (70) O2 Sat: 95 % Wt: 209 lbs.  Home Weights: 210 - 211 lbs.  Last weight:  202 lbs. Ht: 6'1"  LVAD interrogation reveals:  Speed: 9000 Flow: 5.7 Power: 5.5 w PI: 4.5 - 5.7 Alarms: none Events: Rare     Fixed speed:  9000 Low speed limit: 8400 Primary Controller:  Replace back up battery in 25 months  Back up controller:   Replace back up battery in 26 months   LVAD interrogation NEGATIVE for clinical alarms, STABLE for PI Events. No changes were made to program today. Back up equipment is present today and patient/caregiver do not have any concerns about equipment today.   LVAD exit site:  Well healed and incorporated. The velour is fully implanted at exit site. Dressing dry and intact. No erythema or drainage. Stabilization device present and accurately applied. Driveline dressing is being changed weekly per sterile technique using Sorbaview dressing without biopatch (causes skin irritation) exit site. Pt denies fever or chills.    GB drain site is clean with gauze dressing.   Encounter Details: Presents to clinic today  with his wife for 1 m FU. ERCP performed 914/17 with Ardis Hughs >> 2 stones removed and clipped duct canal. No drainage into chole bag x 1 month. Denies abdominal pain. Appetite is better and has put back on some weight. Still complaining of weakness, fatigue and some shortness of breath. Reports he has had to take lasix 1 - 3 times a week for visible swelling of the ankles. Reports the  40 mg dose "wipes him out." Reinforced low sodium diet to avoid needing to take diuretics (he eats at [place] and had [employer] last night).   GB drain C/D/I. To have appointment with Dr. Dalbert Batman on 07/04/16 to discuss taking out the GB drain at that time.   FU with Dr. Burr Medico for continuous Iron infusions. Next scheduled Friday. Next Octreotide 07/04/16  He is currently on Eliquis 5 mg BID. Denies any dark or bloody stools. Tolerating anticoagulation plan.   Labs: LDH 248 Established baseline 180 - 250. No power elevations or tea-colored urine.   Hgb: 9.8 today. Was 11.2 a few weeks ago. I see he got InTolerating anticoagulation plan. Denies BRBPR or melanic stool. Dr. Burr Medico following as well.   Creatinine: 0.92 >> stable with stable electrolytes.   Requested thyroid studies to be done he is feeling lethargic and is maintained on synthroid. TSH 3w ago was >7. Will draw full panel today per their request and fax to Dr. Ilean China for management as he is currently on synthroid and symptomatic.  30d Monitor shows AFib 67% of time, one episode of tachycardia and several episodes of bradycardia (6%). Continue Amiodarone 200 mg QD. TSH/T4 pending today per amiodarone surveillance.     Medication Changes: none today  RTC in 2 months - instructed to call when GB drain comes out to see if this appointment is still appropriate.   Janene Madeira, RN VAD Coordinator   Office: [phone] 24/7 Emergency VAD Pager: [phone]    Patient Instructions: 1. Will fax thyroid results to Dr. Noah Delaine - looks like your  levothyroxine needs to be adjusted based on your lab work a few weeks ago and symptoms.   2. Try half of a lasix pill (20 mg) next time you need to take one to see if you tolerate it better. Weigh yourself before and after the pill to help gauge the effect.   3. Back to see [place] in 2 months >> call when your drain comes out so we can see if this is still an appropriate time frame.
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
## 2016-06-19 NOTE — Patient Instructions (Addendum)
1. Will fax thyroid results to Dr. Noah Delaine - looks like your levothyroxine needs to be adjusted based on your lab work a few weeks ago and symptoms.   2. Try half of a lasix pill (20 mg) next time you need to take one to see if you tolerate it better. Weigh yourself before and after the pill to help gauge the effect.   3. Back to see [place] in 2 months >> call when your drain comes out so we can see if this is still an appropriate time frame.
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
## 2016-06-19 NOTE — Progress Notes (Signed)
Per [name] verbal approval for 12 visits with Dr. Daleen Squibb in [place] clinic approved until 05/2017. No communication received from [place]. Spent 1h on hold with them today to obtain authorization information to scan into chart. Will attempt again.

## 2016-06-20 ENCOUNTER — Other Ambulatory Visit: Payer: No Typology Code available for payment source

## 2016-06-20 LAB — T3, FREE: T3, Free: 1.8 pg/mL — ABNORMAL LOW (ref 2.0–4.4)

## 2016-06-21 ENCOUNTER — Ambulatory Visit (HOSPITAL_BASED_OUTPATIENT_CLINIC_OR_DEPARTMENT_OTHER): Payer: No Typology Code available for payment source

## 2016-06-21 ENCOUNTER — Other Ambulatory Visit (HOSPITAL_BASED_OUTPATIENT_CLINIC_OR_DEPARTMENT_OTHER): Payer: No Typology Code available for payment source

## 2016-06-21 VITALS — BP 98/77 | HR 60 | Temp 97.7°F | Resp 17

## 2016-06-21 DIAGNOSIS — D62 Acute posthemorrhagic anemia: Secondary | ICD-10-CM

## 2016-06-21 DIAGNOSIS — D500 Iron deficiency anemia secondary to blood loss (chronic): Secondary | ICD-10-CM | POA: Diagnosis not present

## 2016-06-21 DIAGNOSIS — K922 Gastrointestinal hemorrhage, unspecified: Secondary | ICD-10-CM

## 2016-06-21 DIAGNOSIS — D509 Iron deficiency anemia, unspecified: Secondary | ICD-10-CM

## 2016-06-21 LAB — IRON AND TIBC
%SAT: 19 % — AB (ref 20–55)
IRON: 52 ug/dL (ref 42–163)
TIBC: 283 ug/dL (ref 202–409)
UIBC: 230 ug/dL (ref 117–376)

## 2016-06-21 LAB — CBC & DIFF AND RETIC
BASO%: 0.2 % (ref 0.0–2.0)
Basophils Absolute: 0 10*3/uL (ref 0.0–0.1)
EOS%: 2 % (ref 0.0–7.0)
Eosinophils Absolute: 0.1 10*3/uL (ref 0.0–0.5)
HCT: 32.8 % — ABNORMAL LOW (ref 38.4–49.9)
HGB: 9.9 g/dL — ABNORMAL LOW (ref 13.0–17.1)
IMMATURE RETIC FRACT: 25.5 % — AB (ref 3.00–10.60)
LYMPH%: 6.7 % — AB (ref 14.0–49.0)
MCH: 30.4 pg (ref 27.2–33.4)
MCHC: 30.2 g/dL — AB (ref 32.0–36.0)
MCV: 100.6 fL — AB (ref 79.3–98.0)
MONO#: 0.4 10*3/uL (ref 0.1–0.9)
MONO%: 6.6 % (ref 0.0–14.0)
NEUT%: 84.5 % — ABNORMAL HIGH (ref 39.0–75.0)
NEUTROS ABS: 5.1 10*3/uL (ref 1.5–6.5)
Platelets: 116 10*3/uL — ABNORMAL LOW (ref 140–400)
RBC: 3.26 10*6/uL — AB (ref 4.20–5.82)
RDW: 19.7 % — ABNORMAL HIGH (ref 11.0–14.6)
Retic %: 7.03 % — ABNORMAL HIGH (ref 0.80–1.80)
Retic Ct Abs: 229.18 10*3/uL — ABNORMAL HIGH (ref 34.80–93.90)
WBC: 6.1 10*3/uL (ref 4.0–10.3)
lymph#: 0.4 10*3/uL — ABNORMAL LOW (ref 0.9–3.3)

## 2016-06-21 MED ORDER — SODIUM CHLORIDE 0.9 % IV SOLN
510.0000 mg | Freq: Once | INTRAVENOUS | Status: AC
  Administered 2016-06-21: 510 mg via INTRAVENOUS
  Filled 2016-06-21: qty 17

## 2016-06-21 NOTE — Progress Notes (Signed)
Pt completed 30 min observation period without complaints.  Vitals obtained and remain stable as charted.  PIV removed and pt discharged ambulatory in no acute distress accompanied by wife.

## 2016-06-21 NOTE — Patient Instructions (Signed)

## 2016-06-21 NOTE — Progress Notes (Signed)
Pt receiving 2nd feraheme today. Pt agreed to stay for 81min post-infusion for observation. VSS and pt asymptomatic.

## 2016-06-30 ENCOUNTER — Other Ambulatory Visit: Payer: Self-pay | Admitting: Hematology

## 2016-07-04 ENCOUNTER — Ambulatory Visit (HOSPITAL_BASED_OUTPATIENT_CLINIC_OR_DEPARTMENT_OTHER): Payer: No Typology Code available for payment source

## 2016-07-04 ENCOUNTER — Other Ambulatory Visit (HOSPITAL_BASED_OUTPATIENT_CLINIC_OR_DEPARTMENT_OTHER): Payer: No Typology Code available for payment source

## 2016-07-04 VITALS — BP 84/59 | HR 68 | Temp 98.2°F | Resp 20

## 2016-07-04 DIAGNOSIS — K598 Other specified functional intestinal disorders: Secondary | ICD-10-CM | POA: Diagnosis not present

## 2016-07-04 DIAGNOSIS — D500 Iron deficiency anemia secondary to blood loss (chronic): Secondary | ICD-10-CM | POA: Diagnosis not present

## 2016-07-04 DIAGNOSIS — D62 Acute posthemorrhagic anemia: Secondary | ICD-10-CM

## 2016-07-04 DIAGNOSIS — D509 Iron deficiency anemia, unspecified: Secondary | ICD-10-CM

## 2016-07-04 LAB — CBC & DIFF AND RETIC
BASO%: 0.4 % (ref 0.0–2.0)
BASOS ABS: 0 10*3/uL (ref 0.0–0.1)
EOS%: 2.1 % (ref 0.0–7.0)
Eosinophils Absolute: 0.1 10*3/uL (ref 0.0–0.5)
HEMATOCRIT: 34.8 % — AB (ref 38.4–49.9)
HGB: 10.2 g/dL — ABNORMAL LOW (ref 13.0–17.1)
Immature Retic Fract: 22.2 % — ABNORMAL HIGH (ref 3.00–10.60)
LYMPH#: 0.4 10*3/uL — AB (ref 0.9–3.3)
LYMPH%: 8.5 % — AB (ref 14.0–49.0)
MCH: 30.4 pg (ref 27.2–33.4)
MCHC: 29.3 g/dL — AB (ref 32.0–36.0)
MCV: 103.6 fL — AB (ref 79.3–98.0)
MONO#: 0.4 10*3/uL (ref 0.1–0.9)
MONO%: 8.5 % (ref 0.0–14.0)
NEUT#: 3.9 10*3/uL (ref 1.5–6.5)
NEUT%: 80.5 % — AB (ref 39.0–75.0)
PLATELETS: 105 10*3/uL — AB (ref 140–400)
RBC: 3.36 10*6/uL — AB (ref 4.20–5.82)
RDW: 20.5 % — ABNORMAL HIGH (ref 11.0–14.6)
RETIC CT ABS: 204.96 10*3/uL — AB (ref 34.80–93.90)
Retic %: 6.1 % — ABNORMAL HIGH (ref 0.80–1.80)
WBC: 4.8 10*3/uL (ref 4.0–10.3)

## 2016-07-04 LAB — FERRITIN: Ferritin: 183 ng/ml (ref 22–316)

## 2016-07-04 MED ORDER — OCTREOTIDE ACETATE 20 MG IM KIT
20.0000 mg | PACK | Freq: Once | INTRAMUSCULAR | Status: AC
  Administered 2016-07-04: 20 mg via INTRAMUSCULAR
  Filled 2016-07-04: qty 1

## 2016-07-04 NOTE — Patient Instructions (Signed)

## 2016-07-11 ENCOUNTER — Telehealth (HOSPITAL_COMMUNITY): Payer: Self-pay

## 2016-07-11 NOTE — Telephone Encounter (Signed)
Patient's wife called inquiring about a bill that she had further questions about (did not specify further). Also wanting to know if patient will continue Optrietide injections as the next scheduled injection on 1//5 is the last approved dose. Left return VM advising to call back to explain bill, to come by office, or to fax copy to CHF clinic. Informed will inquire with CHF clinical pharmacist Doroteo Bradford to confirm if further injections are needed and if so to make sure insurance will cover this cost.  Leory Plowman, Guinevere Ferrari, RN

## 2016-07-14 ENCOUNTER — Other Ambulatory Visit: Payer: Self-pay | Admitting: Hematology

## 2016-07-16 ENCOUNTER — Other Ambulatory Visit (HOSPITAL_COMMUNITY): Payer: Self-pay | Admitting: General Surgery

## 2016-07-16 DIAGNOSIS — K8021 Calculus of gallbladder without cholecystitis with obstruction: Secondary | ICD-10-CM

## 2016-07-18 ENCOUNTER — Emergency Department (HOSPITAL_COMMUNITY)
Admission: EM | Admit: 2016-07-18 | Discharge: 2016-07-18 | Disposition: A | Discharge status: Home / Self Care | Payer: Non-veteran care | Attending: Emergency Medicine | Admitting: Emergency Medicine

## 2016-07-18 ENCOUNTER — Telehealth (HOSPITAL_COMMUNITY): Payer: Self-pay | Admitting: Infectious Diseases

## 2016-07-18 ENCOUNTER — Encounter (HOSPITAL_COMMUNITY): Payer: Self-pay | Admitting: *Deleted

## 2016-07-18 ENCOUNTER — Other Ambulatory Visit (HOSPITAL_BASED_OUTPATIENT_CLINIC_OR_DEPARTMENT_OTHER): Payer: No Typology Code available for payment source

## 2016-07-18 DIAGNOSIS — Z8673 Personal history of transient ischemic attack (TIA), and cerebral infarction without residual deficits: Secondary | ICD-10-CM | POA: Diagnosis not present

## 2016-07-18 DIAGNOSIS — R040 Epistaxis: Secondary | ICD-10-CM | POA: Insufficient documentation

## 2016-07-18 DIAGNOSIS — E039 Hypothyroidism, unspecified: Secondary | ICD-10-CM | POA: Diagnosis not present

## 2016-07-18 DIAGNOSIS — Z87891 Personal history of nicotine dependence: Secondary | ICD-10-CM | POA: Insufficient documentation

## 2016-07-18 DIAGNOSIS — J449 Chronic obstructive pulmonary disease, unspecified: Secondary | ICD-10-CM | POA: Insufficient documentation

## 2016-07-18 DIAGNOSIS — D500 Iron deficiency anemia secondary to blood loss (chronic): Secondary | ICD-10-CM | POA: Diagnosis not present

## 2016-07-18 DIAGNOSIS — Z95811 Presence of heart assist device: Secondary | ICD-10-CM | POA: Diagnosis not present

## 2016-07-18 DIAGNOSIS — I5023 Acute on chronic systolic (congestive) heart failure: Secondary | ICD-10-CM | POA: Insufficient documentation

## 2016-07-18 DIAGNOSIS — Z79899 Other long term (current) drug therapy: Secondary | ICD-10-CM | POA: Insufficient documentation

## 2016-07-18 DIAGNOSIS — I2510 Atherosclerotic heart disease of native coronary artery without angina pectoris: Secondary | ICD-10-CM | POA: Insufficient documentation

## 2016-07-18 DIAGNOSIS — Z951 Presence of aortocoronary bypass graft: Secondary | ICD-10-CM | POA: Diagnosis not present

## 2016-07-18 DIAGNOSIS — I252 Old myocardial infarction: Secondary | ICD-10-CM | POA: Insufficient documentation

## 2016-07-18 DIAGNOSIS — Z7901 Long term (current) use of anticoagulants: Secondary | ICD-10-CM | POA: Diagnosis not present

## 2016-07-18 DIAGNOSIS — Z8522 Personal history of malignant neoplasm of nasal cavities, middle ear, and accessory sinuses: Secondary | ICD-10-CM | POA: Insufficient documentation

## 2016-07-18 DIAGNOSIS — K922 Gastrointestinal hemorrhage, unspecified: Secondary | ICD-10-CM

## 2016-07-18 DIAGNOSIS — D509 Iron deficiency anemia, unspecified: Secondary | ICD-10-CM

## 2016-07-18 DIAGNOSIS — D68318 Other hemorrhagic disorder due to intrinsic circulating anticoagulants, antibodies, or inhibitors: Secondary | ICD-10-CM | POA: Diagnosis not present

## 2016-07-18 DIAGNOSIS — Z955 Presence of coronary angioplasty implant and graft: Secondary | ICD-10-CM | POA: Insufficient documentation

## 2016-07-18 DIAGNOSIS — I110 Hypertensive heart disease with heart failure: Secondary | ICD-10-CM | POA: Diagnosis not present

## 2016-07-18 DIAGNOSIS — Z95810 Presence of automatic (implantable) cardiac defibrillator: Secondary | ICD-10-CM | POA: Insufficient documentation

## 2016-07-18 LAB — CBC & DIFF AND RETIC
BASO%: 0.5 % (ref 0.0–2.0)
Basophils Absolute: 0 10*3/uL (ref 0.0–0.1)
EOS ABS: 0.2 10*3/uL (ref 0.0–0.5)
EOS%: 2.9 % (ref 0.0–7.0)
HCT: 30.5 % — ABNORMAL LOW (ref 38.4–49.9)
HEMOGLOBIN: 9 g/dL — AB (ref 13.0–17.1)
IMMATURE RETIC FRACT: 24.1 % — AB (ref 3.00–10.60)
LYMPH%: 8.3 % — ABNORMAL LOW (ref 14.0–49.0)
MCH: 30.5 pg (ref 27.2–33.4)
MCHC: 29.5 g/dL — ABNORMAL LOW (ref 32.0–36.0)
MCV: 103.4 fL — ABNORMAL HIGH (ref 79.3–98.0)
MONO#: 0.5 10*3/uL (ref 0.1–0.9)
MONO%: 8 % (ref 0.0–14.0)
NEUT%: 80.3 % — ABNORMAL HIGH (ref 39.0–75.0)
NEUTROS ABS: 5 10*3/uL (ref 1.5–6.5)
Platelets: 155 10*3/uL (ref 140–400)
RBC: 2.95 10*6/uL — AB (ref 4.20–5.82)
RDW: 19.4 % — AB (ref 11.0–14.6)
RETIC %: 7.88 % — AB (ref 0.80–1.80)
Retic Ct Abs: 232.46 10*3/uL — ABNORMAL HIGH (ref 34.80–93.90)
WBC: 6.3 10*3/uL (ref 4.0–10.3)
lymph#: 0.5 10*3/uL — ABNORMAL LOW (ref 0.9–3.3)

## 2016-07-18 LAB — IRON AND TIBC
%SAT: 10 % — ABNORMAL LOW (ref 20–55)
Iron: 32 ug/dL — ABNORMAL LOW (ref 42–163)
TIBC: 331 ug/dL (ref 202–409)
UIBC: 299 ug/dL (ref 117–376)

## 2016-07-18 MED ORDER — SULFAMETHOXAZOLE-TRIMETHOPRIM 800-160 MG PO TABS
1.0000 | ORAL_TABLET | Freq: Once | ORAL | Status: AC
  Administered 2016-07-18: 1 via ORAL
  Filled 2016-07-18: qty 1

## 2016-07-18 MED ORDER — SULFAMETHOXAZOLE-TRIMETHOPRIM 800-160 MG PO TABS: 1.0000 | ORAL_TABLET | Freq: Two times a day (BID) | ORAL | 0 refills | Status: DC

## 2016-07-18 MED ORDER — OXYCODONE-ACETAMINOPHEN 5-325 MG PO TABS: 1.0000 | ORAL_TABLET | ORAL | 0 refills | Status: DC | PRN

## 2016-07-18 MED ORDER — OXYCODONE-ACETAMINOPHEN 5-325 MG PO TABS
1.0000 | ORAL_TABLET | Freq: Once | ORAL | Status: AC
  Administered 2016-07-18: 1 via ORAL
  Filled 2016-07-18: qty 1

## 2016-07-18 NOTE — ED Notes (Signed)
LVAD pt.

## 2016-07-18 NOTE — Telephone Encounter (Signed)
Page returned to patient's wife who reports persistent nosebleed that started at [place]. They have tried Afrin nasal spray, packing, pressure, ice without help. Advised to report to ED for assessment for packing/cautery. ED Charge informed. No need for LVAD cart at this time. Informed rapid of his arrival should they need assistance. Dr. Haroldine Laws updated.

## 2016-07-18 NOTE — ED Triage Notes (Signed)
Pt c/o nose bleed since 1600. Pt is not have any success with tampons, direct pressure and afrin. Rapid response and charge RN notified pt is an LVAD pt.

## 2016-07-18 NOTE — ED Notes (Signed)
Pt brought in to room placed on monitor family at bedside

## 2016-07-18 NOTE — ED Provider Notes (Signed)
[facility] DEPT Provider Note   CSN: [id number] Arrival date & time: 07/18/16  1725     History   Chief Complaint Chief Complaint  Patient presents with  . Epistaxis    LVAD    HPI [name] is a 75 y.o. male.  He has a history of atrial fibrillation, heart failure, cirrhosis of liver, COPD, and has left ventricular assist device in place with chronic anticoagulation. At about 4:10 PM, he started having bleeding from his right nostril. He tried using Afrin nasal spray, applying pressure, ice, and inserting a nasal tampon but has not been able to stop the bleeding. He denies any trauma. He does have history of nosebleeds in the past.   The history is provided by the patient.  Epistaxis      Past Medical History:  Diagnosis Date  . Anemia   . Asbestosis(501)    "6 years in the [place]" (05/24/2013)  . Atrial fibrillation ([place])    permanent  . Cancer ([place])    "scraped some off behind my left ear; fast moving; having it cut out 12/05/2015" (11/07/2015)  . CHF (congestive heart failure) ([place])   . Chronic anticoagulation    Afib and LVAD  . Chronic systolic heart failure ([place])   . 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 Complication of anesthesia    "they can't put me all the way to sleep cause of my heart" (11/07/2015)  . COPD (chronic obstructive pulmonary disease) ([place])   . Coronary artery disease   . Depression   . Epistaxis 11/2015, 07/2014  . History of blood transfusion 08/2015 X 10; 11/2015 X 2   "related to bleeding on the inside somewhere" (11/07/2015)  . Hyperlipidemia   . Hypertension   . Hypothyroidism   . Ischemic cardiomyopathy     CABG 2003, PCI 2007  EF 27%(myoview 2012  . LVAD (left ventricular assist device) present ([place]) 12/2012  . Myocardial infarction 1990's-2000   "2 in  ~ the 1990's; 1 in ~ 2000" (11/07/2015)  . Obesity   . On home oxygen therapy    "have it available; don't use it" (11/07/2015)  . OSA on  CPAP    no longer on cpap  . Paroxysmal ventricular tachycardia ([place])   . Restless leg syndrome   . Shortness of breath dyspnea     Patient Active Problem List   Diagnosis Date Noted  . Common bile duct (CBD) obstruction   . Choledocholithiasis   . Chronic cholecystitis with calculus 04/23/2016  . Chronic anticoagulation 04/23/2016  . Septic shock ([place]) 03/03/2016  . Acute calculous cholecystitis 03/03/2016  . Chronic systolic (congestive) heart failure   . Hypotension 02/26/2016  . AVM (arteriovenous malformation) of colon with hemorrhage 02/23/2016  . Atrial flutter ([place]) 12/08/2015  . AVM (arteriovenous malformation) of small bowel, acquired ([place])   . Hemorrhoid   . BRBPR (bright red blood per rectum)   . Symptomatic anemia 11/07/2015  . Epistaxis, recurrent   . PAF (paroxysmal atrial fibrillation) ([place])   . Acute on chronic systolic (congestive) heart failure   . Thrombocytopenia ([place]) 08/10/2015  . Leg pain, left 07/15/2015  . Left ventricular assist device present ([place])   . Pacemaker complications [id number]  . Implantable cardioverter-defibrillator infection ([place]) 02/28/2015  . Anemia due to blood loss, acute   . GI bleed 09/25/2014  . Atrial fibrillation, unspecified   . Angiodysplasia of duodenum with hemorrhage  08/01/2014  . Chronic atrial fibrillation ([place])   . Intestinal bleeding   . Acute blood loss anemia 07/23/2014  . Anemia 07/23/2014  . Benign neoplasm of colon 03/09/2014  . Internal hemorrhoids 03/09/2014  . Gastric erosions 03/09/2014  . GI bleeding 03/07/2014  . Anemia, iron deficiency 05/26/2013  . TIA (transient ischemic attack) 05/24/2013  . Essential hypertension 04/21/2013  . Reactive depression (situational) 01/22/2013  . Incisional pain 01/14/2013  . LVAD (left ventricular assist device) present ([place]) 01/13/2013  . COPD (chronic obstructive pulmonary disease) ([place]) 01/11/2013  . Abnormal PFTs 01/11/2013  . Pulmonary hypertension 01/11/2013   . Chronic systolic heart failure ([place]) 07/29/2012  . Obstructive sleep apnea 07/02/2012  . Coronary artery disease 04/22/2011  . Ventricular tachycardia ([place]) 02/15/2011  . Ischemic cardiomyopathy   . ICD (implantable cardiac defibrillator) in place   . Atrial fibrillation [facility])     Past Surgical History:  Procedure Laterality Date  . APPENDECTOMY    . CARDIAC DEFIBRILLATOR PLACEMENT  2004; ~ 2010   "cut it out in 2016 after it got infected"   . COLONOSCOPY N/A 03/09/2014   Procedure: COLONOSCOPY;  Surgeon: Inda Castle, MD;  Location: [facility];  Service: Endoscopy;  Laterality: N/A;  LVAD  patient  . COLONOSCOPY N/A 07/29/2014   Procedure: COLONOSCOPY;  Surgeon: Gatha Mayer, MD;  Location: [facility];  Service: Endoscopy;  Laterality: N/A;  . CORONARY ARTERY BYPASS GRAFT  2003   "?X4" (05/24/2013)  . ENTEROSCOPY N/A 07/27/2014   Procedure: ENTEROSCOPY;  Surgeon: Gatha Mayer, MD;  Location[facility];  Service: Endoscopy;  Laterality: N/A;  LVAD patient   . ENTEROSCOPY N/A 09/27/2014   Procedure: ENTEROSCOPY;  Surgeon: Gatha Mayer, MD;  Location: [facility]OSCOPY;  Service: Endoscopy;  Laterality: N/A;  . ENTEROSCOPY N/A 11/09/2015   Procedure: ENTEROSCOPY;  Surgeon: Mauri Pole, MD;  [facility];  Service: Endoscopy;  Laterality: N/A;  LVAD  . ENTEROSCOPY N/A 12/13/2015   Procedure: ENTEROSCOPY;  Surgeon: Milus Banister, MD;  Location: [facility];  Service: Endoscopy;  Laterality: N/A;  . ENTEROSCOPY N/A 02/21/2016   Procedure: ENTEROSCOPY;  Surgeon: Milus Banister, MD;  Location: [facility];  Service: Endoscopy;  Laterality: N/A;  . ERCP N/A 06/13/2016   Procedure: ENDOSCOPIC RETROGRADE CHOLANGIOPANCREATOGRAPHY (ERCP);  Surgeon: Milus Banister, MD;  Location: [facility];  Service: Endoscopy;  Laterality: N/A;  . ESOPHAGOGASTRODUODENOSCOPY N/A 03/09/2014   Procedure: ESOPHAGOGASTRODUODENOSCOPY (EGD);  Surgeon: Inda Castle, MD;  Location: [facility];  Service: Endoscopy;  Laterality: N/A;  . ESOPHAGOGASTRODUODENOSCOPY N/A 08/01/2014   Procedure: ESOPHAGOGASTRODUODENOSCOPY (EGD);  Surgeon: Jerene Bears, MD;  Location: [facility] ENDOSCOPY;  Service: Endoscopy;  Laterality: N/A;  LVAD patient  . GIVENS CAPSULE STUDY N/A 03/10/2014   Procedure: GIVENS CAPSULE STUDY;  Surgeon: Inda Castle, MD;  Location: [facility];  Service: Endoscopy;  Laterality: N/A;  . GIVENS CAPSULE STUDY N/A 07/30/2014   Procedure: GIVENS CAPSULE STUDY;  Surgeon: Gatha Mayer, MD;  Location: [facility];  Service: Endoscopy;  Laterality: N/A;  . GIVENS CAPSULE STUDY N/A 12/11/2015   Procedure: GIVENS CAPSULE STUDY;  Surgeon: Gatha Mayer, MD;  Location: [facility];  Service: Endoscopy;  Laterality: N/A;  . ICD LEAD REMOVAL Left 03/16/2015   Procedure: ICD LEAD REMOVAL/EXTRACTION;  Surgeon: Evans Lance, MD;  Location: [facility];  Service: Cardiovascular;  Laterality: Left;  PATIENT HAS LVAD  DR. VAN TRIGT TO BACK UP EXTRACTION  . INSERTION OF  IMPLANTABLE LEFT VENTRICULAR ASSIST DEVICE N/A 01/12/2013   Procedure: INSERTION OF IMPLANTABLE LEFT VENTRICULAR ASSIST DEVICE;  Surgeon: Ivin Poot, MD;  Location: [facility];  Service: Open Heart Surgery;  Laterality: N/A;   nitric oxide; Redo sternotomy  . INTRAOPERATIVE TRANSESOPHAGEAL ECHOCARDIOGRAM N/A 01/12/2013   Procedure: INTRAOPERATIVE TRANSESOPHAGEAL ECHOCARDIOGRAM;  Surgeon: Ivin Poot, MD;  Location: [facility];  Service: Open Heart Surgery;  Laterality: N/A;  . LEFT AND RIGHT HEART CATHETERIZATION WITH CORONARY/GRAFT ANGIOGRAM  01/07/2013   Procedure: LEFT AND RIGHT HEART CATHETERIZATION WITH Beatrix Fetters;  Surgeon: Jolaine Artist, MD;  Location: [facility] C[facility] LAB;  Service: Cardiovascular;;       Home Medications    Prior to Admission medications   Medication Sig Start Date End Date Taking? Authorizing Provider  albuterol (PROVENTIL) (2.5 MG/3ML) 0.083% nebulizer solution Take 2.5 mg by  nebulization every 6 (six) hours as needed for wheezing or shortness of breath.    Historical Provider, MD  amiodarone (PACERONE) 200 MG tablet Take 1 tablet (200 mg total) by mouth every evening. Patient taking differently: Take 200 mg by mouth daily.  03/29/16   Larey Dresser, MD  apixaban (ELIQUIS) 5 MG TABS tablet Take 1 tablet (5 mg total) by mouth 2 (two) times daily. 12/25/15   Jolaine Artist, MD  budesonide-formoterol (SYMBICORT) 160-4.5 MCG/ACT inhaler Inhale 2 puffs into the lungs 2 (two) times daily. 01/10/15   Jolaine Artist, MD  cholecalciferol (VITAMIN D) 1000 UNITS tablet Take 1 tablet (1,000 Units total) by mouth 2 (two) times daily. 01/10/15   Jolaine Artist, MD  citalopram (CELEXA) 40 MG tablet Take 1 tablet (40 mg total) by mouth at bedtime. 01/10/15   Jolaine Artist, MD  docusate sodium (COLACE) 100 MG capsule Take 100 mg by mouth 2 (two) times daily.     Historical Provider, MD  furosemide (LASIX) 40 MG tablet Use only if a weight gain of 5 pounds in a week occurs or evident swelling is present 03/29/16   Larey Dresser, MD  hydrocortisone (ANUSOL-HC) 2.5 % rectal cream Place 1 application rectally 2 (two) times daily. Patient taking differently: Place 1 application rectally as needed. As needed 11/21/15   Larey Dresser, MD  levothyroxine (SYNTHROID, LEVOTHROID) 100 MCG tablet Take 1 tablet (100 mcg total) by mouth at bedtime. 02/23/16   Amy D Clegg, NP  losartan (COZAAR) 25 MG tablet Take 25 mg by mouth at bedtime.    Historical Provider, MD  Multiple Vitamin (MULTIVITAMIN WITH MINERALS) TABS tablet Take 1 tablet by mouth daily.    Historical Provider, MD  nitroGLYCERIN (NITROSTAT) 0.4 MG SL tablet Place 0.4 mg under the tongue every 5 (five) minutes as needed for chest pain. Reported on 04/09/2016    Historical Provider, MD  pantoprazole (PROTONIX) 40 MG tablet Take 1 tablet (40 mg total) by mouth 2 (two) times daily. 01/10/15   Jolaine Artist, MD  potassium  chloride SA (K-DUR,KLOR-CON) 20 MEQ tablet Take 2 tablets (40 mEq total) by mouth daily. And take extra 1 pill with lasix when needed. 04/30/16   Amy D Ninfa Meeker, NP  PRESCRIPTION MEDICATION Ferumoxytol (FERAHEME) 510 mg in sodium chloride 0.9 % 100 mL IVPB (Once) Route: Intravenous    Historical Provider, MD  PRESCRIPTION MEDICATION Octreotide (SANDOSTATIN LAR) IM injection 20 mg (Once)Route: Intramuscular    Historical Provider, MD  simvastatin (ZOCOR) 80 MG tablet Take 0.5 tablets (40 mg total) by mouth at bedtime. 01/10/15   Quillian Quince  R Bensimhon, MD  tiotropium (SPIRIVA) 18 MCG inhalation capsule Place 1 capsule (18 mcg total) into inhaler and inhale daily. 01/10/15   Jolaine Artist, MD  vitamin B-12 (CYANOCOBALAMIN) 1000 MCG tablet Take 1,000 mcg by mouth at bedtime.    Historical Provider, MD  vitamin C (ASCORBIC ACID) 500 MG tablet Take 1 tablet (500 mg total) by mouth 2 (two) times daily. 01/10/15   Jolaine Artist, MD    Family History Family History  Problem Relation Age of Onset  . Heart attack Mother   . Heart attack Father     Social History Social History  Substance Use Topics  . Smoking status: Former Smoker    Packs/day: 2.00    Years: 45.00    Types: Cigarettes    Quit date: 11/11/2001  . Smokeless tobacco: Never Used  . Alcohol use Yes     Comment: 05/24/2013 "use to drink beer; hardly nothing since 2003; nothing at all since 12/2011 when I got my LVAD  "     Allergies   Tape   Review of Systems Review of Systems  HENT: Positive for nosebleeds.   All other systems reviewed and are negative.    Physical Exam Updated Vital Signs Pulse 71   Temp 97.5 F (36.4 C) (Oral)   Resp 18   Ht 6' (1.829 m)   Wt 210 lb (95.3 kg)   SpO2 96%   BMI 28.48 kg/m   Physical Exam  Nursing note and vitals reviewed.  75 year old male, resting comfortably and in no acute distress. Vital signs are normal for patient with left ventricular assist device. Oxygen saturation is  96%, which is normal. Head is normocephalic and atraumatic. PERRLA, EOMI. Oropharynx is clear. Bleeding site is identified on the right side of the nasal septum. Neck is nontender and supple without adenopathy or JVD. Back is nontender and there is no CVA tenderness. Lungs are clear without rales, wheezes, or rhonchi. Chest is nontender. Heart has steady, consistent with left ventricular assist device. Abdomen is soft, flat, nontender without masses or hepatosplenomegaly and peristalsis is normoactive. Extremities have 1+ edema, full range of motion is present. Skin is warm and dry without rash. Neurologic: Mental status is normal, cranial nerves are intact, there are no motor or sensory deficits.  ED Treatments / Results   Procedures .Epistaxis Management Date/Time: 07/18/2016 6:23 PM Performed by: Delora Fuel Authorized by: Roxanne Mins, Latandra Loureiro   Consent:    Consent obtained:  Verbal   Consent given by:  Patient   Risks discussed:  Bleeding, infection, nasal injury and pain   Alternatives discussed:  Alternative treatment and observation Anesthesia (see MAR for exact dosages):    Anesthesia method:  None Procedure details:    Treatment site:  R anterior   Treatment method:  Anterior pack (Rapid Rhino 5.5 cm)   Treatment complexity:  Limited   Treatment episode: initial   Post-procedure details:    Assessment:  Bleeding stopped   Patient tolerance of procedure:  Tolerated well, no immediate complications   (including critical care time)  Medications Ordered in ED Medications - No data to display   Initial Impression / Assessment and Plan / ED Course  I have reviewed the triage vital signs and the nursing notes.  Pertinent labs & imaging results that were available during my care of the patient were reviewed by me and considered in my medical decision making (see chart for details).  Clinical Course   Right-sided epistaxis  in patient with systemic anticoagulation because of  left ventricular assist device and also atrial fibrillation. Old records are reviewed, and he does have a prior ED visit for epistaxis. He is treated with Rapid Rhino insertion.  Bleeding was controlled with rapid Rhino. He was observed for one hour with no recurrence of bleeding and was discharged with prescriptions for trimethoprim-sulfamethoxazole and oxycodone-acetaminophen. Advised to have Rapid Rhino removed in 5 days.  Final Clinical Impressions(s) / ED Diagnoses   Final diagnoses:  Right-sided epistaxis  Chronic anticoagulation  LVAD (left ventricular assist device) present (HCC)    New Prescriptions New Prescriptions   OXYCODONE-ACETAMINOPHEN (PERCOCET) 5-325 MG TABLET    Take 1 tablet by mouth every 4 (four) hours as needed for moderate pain.   SULFAMETHOXAZOLE-TRIMETHOPRIM (BACTRIM DS,SEPTRA DS) 800-160 MG TABLET    Take 1 tablet by mouth 2 (two) times daily.     Delora Fuel, MD [id number]

## 2016-07-21 ENCOUNTER — Emergency Department (HOSPITAL_COMMUNITY)
Admission: EM | Admit: 2016-07-21 | Discharge: 2016-07-21 | Disposition: A | Discharge status: Home / Self Care | Payer: Non-veteran care | Attending: Emergency Medicine | Admitting: Emergency Medicine

## 2016-07-21 DIAGNOSIS — I5023 Acute on chronic systolic (congestive) heart failure: Secondary | ICD-10-CM | POA: Insufficient documentation

## 2016-07-21 DIAGNOSIS — Z7901 Long term (current) use of anticoagulants: Secondary | ICD-10-CM | POA: Insufficient documentation

## 2016-07-21 DIAGNOSIS — I110 Hypertensive heart disease with heart failure: Secondary | ICD-10-CM | POA: Insufficient documentation

## 2016-07-21 DIAGNOSIS — Z8673 Personal history of transient ischemic attack (TIA), and cerebral infarction without residual deficits: Secondary | ICD-10-CM | POA: Insufficient documentation

## 2016-07-21 DIAGNOSIS — J449 Chronic obstructive pulmonary disease, unspecified: Secondary | ICD-10-CM | POA: Diagnosis not present

## 2016-07-21 DIAGNOSIS — Z79899 Other long term (current) drug therapy: Secondary | ICD-10-CM | POA: Insufficient documentation

## 2016-07-21 DIAGNOSIS — Z951 Presence of aortocoronary bypass graft: Secondary | ICD-10-CM | POA: Insufficient documentation

## 2016-07-21 DIAGNOSIS — R040 Epistaxis: Secondary | ICD-10-CM | POA: Insufficient documentation

## 2016-07-21 DIAGNOSIS — Z95811 Presence of heart assist device: Secondary | ICD-10-CM | POA: Diagnosis not present

## 2016-07-21 DIAGNOSIS — I2510 Atherosclerotic heart disease of native coronary artery without angina pectoris: Secondary | ICD-10-CM | POA: Diagnosis not present

## 2016-07-21 DIAGNOSIS — I252 Old myocardial infarction: Secondary | ICD-10-CM | POA: Insufficient documentation

## 2016-07-21 DIAGNOSIS — Z95810 Presence of automatic (implantable) cardiac defibrillator: Secondary | ICD-10-CM | POA: Diagnosis not present

## 2016-07-21 DIAGNOSIS — Z87891 Personal history of nicotine dependence: Secondary | ICD-10-CM | POA: Insufficient documentation

## 2016-07-21 DIAGNOSIS — E039 Hypothyroidism, unspecified: Secondary | ICD-10-CM | POA: Diagnosis not present

## 2016-07-21 MED ORDER — OXYMETAZOLINE HCL 0.05 % NA SOLN: 1.0000 | Freq: Once | NASAL | Status: DC

## 2016-07-21 NOTE — ED Notes (Signed)
EDP at bedside  

## 2016-07-21 NOTE — ED Triage Notes (Signed)
Rapid response notified.

## 2016-07-21 NOTE — ED Triage Notes (Signed)
Pt c/o nose bleed starting around 1145. Pt was seen here 10/19 for same complaint. Pt has rhino rocket placed in right nare. Pt now bleeding out left nare. Pt is an LVAD pt.

## 2016-07-21 NOTE — ED Notes (Signed)
Pt. LVAD team paged at this time.

## 2016-07-21 NOTE — ED Provider Notes (Signed)
[facility] DEPT Provider Note   CSN: [id number] Arrival date & time: 07/21/16  1551     History   Chief Complaint Chief Complaint  Patient presents with  . Epistaxis/LVAD    HPI [name] is a 75 y.o. male with LVAD in place, with hx of atrial fib on eliquis and recent ED evaluation on 10/19 for epistaxis, s/p rhinorocket placement in his right nare presents noting the onset of small volume epistaxis from his left nare which started today around 11:45am. He states that it just spontaneously started dripping, without any history of trauma to the area. He has left the rhinorocket in place and plans to have it removed on Tuesday. He has been compliant with his regular medication regimen including the eliquis. He states that he tried to hold manual pressure on it for some time to no avail of his sx. He has not tried afrin or any other epistaxis management techniques. He denies any light-headedness, chest pain, syncope, or bleeding anywhere else. No nausea, vomiting, diarrhea. He has had no issues with his LVAD.   HPI  Past Medical History:  Diagnosis Date  . Anemia   . Asbestosis(501)    "6 years in the [place]" (05/24/2013)  . Atrial fibrillation ([place])    permanent  . Cancer ([place])    "scraped some off behind my left ear; fast moving; having it cut out 12/05/2015" (11/07/2015)  . CHF (congestive heart failure) ([place])   . Chronic anticoagulation    Afib and LVAD  . Chronic systolic heart failure ([place])   . 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 Complication of anesthesia    "they can't put me all the way to sleep cause of my heart" (11/07/2015)  . COPD (chronic obstructive pulmonary disease) ([place])   . Coronary artery disease   . Depression   . Epistaxis 11/2015, 07/2014  . History of blood transfusion 08/2015 X 10; 11/2015 X 2   "related to bleeding on the inside somewhere" (11/07/2015)  . Hyperlipidemia   . Hypertension   . Hypothyroidism     . Ischemic cardiomyopathy     CABG 2003, PCI 2007  EF 27%(myoview 2012  . LVAD (left ventricular assist device) present ([place]) 12/2012  . Myocardial infarction 1990's-2000   "2 in  ~ the 1990's; 1 in ~ 2000" (11/07/2015)  . Obesity   . On home oxygen therapy    "have it available; don't use it" (11/07/2015)  . OSA on CPAP    no longer on cpap  . Paroxysmal ventricular tachycardia ([place])   . Restless leg syndrome   . Shortness of breath dyspnea     Patient Active Problem List   Diagnosis Date Noted  . Common bile duct (CBD) obstruction   . Choledocholithiasis   . Chronic cholecystitis with calculus 04/23/2016  . Chronic anticoagulation 04/23/2016  . Septic shock ([place]) 03/03/2016  . Acute calculous cholecystitis 03/03/2016  . Chronic systolic (congestive) heart failure   . Hypotension 02/26/2016  . AVM (arteriovenous malformation) of colon with hemorrhage 02/23/2016  . Atrial flutter ([place]) 12/08/2015  . AVM (arteriovenous malformation) of small bowel, acquired ([place])   . Hemorrhoid   . BRBPR (bright red blood per rectum)   . Symptomatic anemia 11/07/2015  . Epistaxis, recurrent   . PAF (paroxysmal atrial fibrillation) ([place])   . Acute on chronic systolic (congestive) heart failure   . Thrombocytopenia ([place]) 08/10/2015  .  Leg pain, left 07/15/2015  . Left ventricular assist device present ([place])   . Pacemaker complications [id number]  . Implantable cardioverter-defibrillator infection ([place]) 02/28/2015  . Anemia due to blood loss, acute   . GI bleed 09/25/2014  . Atrial fibrillation, unspecified   . Angiodysplasia of duodenum with hemorrhage 08/01/2014  . Chronic atrial fibrillation ([place])   . Intestinal bleeding   . Acute blood loss anemia 07/23/2014  . Anemia 07/23/2014  . Benign neoplasm of colon 03/09/2014  . Internal hemorrhoids 03/09/2014  . Gastric erosions 03/09/2014  . GI bleeding 03/07/2014  . Anemia, iron deficiency 05/26/2013  . TIA (transient ischemic attack)  05/24/2013  . Essential hypertension 04/21/2013  . Reactive depression (situational) 01/22/2013  . Incisional pain 01/14/2013  . LVAD (left ventricular assist device) present ([place]) 01/13/2013  . COPD (chronic obstructive pulmonary disease) ([place]) 01/11/2013  . Abnormal PFTs 01/11/2013  . Pulmonary hypertension 01/11/2013  . Chronic systolic heart failure ([place]) 07/29/2012  . Obstructive sleep apnea 07/02/2012  . Coronary artery disease 04/22/2011  . Ventricular tachycardia ([place]) 02/15/2011  . Ischemic cardiomyopathy   . ICD (implantable cardiac defibrillator) in place   . Atrial fibrillation [facility])     Past Surgical History:  Procedure Laterality Date  . APPENDECTOMY    . CARDIAC DEFIBRILLATOR PLACEMENT  2004; ~ 2010   "cut it out in 2016 after it got infected"   . COLONOSCOPY N/A 03/09/2014   Procedure: COLONOSCOPY;  Surgeon: Inda Castle, MD;  Location: [facility];  Service: Endoscopy;  Laterality: N/A;  LVAD  patient  . COLONOSCOPY N/A 07/29/2014   Procedure: COLONOSCOPY;  Surgeon: Gatha Mayer, MD;  Location: [facility];  Service: Endoscopy;  Laterality: N/A;  . CORONARY ARTERY BYPASS GRAFT  2003   "?X4" (05/24/2013)  . ENTEROSCOPY N/A 07/27/2014   Procedure: ENTEROSCOPY;  Surgeon: Gatha Mayer, MD;  Location[facility] ENDOSCOPY;  Service: Endoscopy;  Laterality: N/A;  LVAD patient   . ENTEROSCOPY N/A 09/27/2014   Procedure: ENTEROSCOPY;  Surgeon: Gatha Mayer, MD;  Location: [facility] ENDOSCOPY;  Service: Endoscopy;  Laterality: N/A;  . ENTEROSCOPY N/A 11/09/2015   Procedure: ENTEROSCOPY;  Surgeon: Mauri Pole, MD;  Location: [facility] ENDOSCOPY;  Service: Endoscopy;  Laterality: N/A;  LVAD  . ENTEROSCOPY N/A 12/13/2015   Procedure: ENTEROSCOPY;  Surgeon: Milus Banister, MD;  Location: [facility];  Service: Endoscopy;  Laterality: N/A;  . ENTEROSCOPY N/A 02/21/2016   Procedure: ENTEROSCOPY;  Surgeon: Milus Banister, MD;  Location: [facility];  Service: Endoscopy;  Laterality:  N/A;  . ERCP N/A 06/13/2016   Procedure: ENDOSCOPIC RETROGRADE CHOLANGIOPANCREATOGRAPHY (ERCP);  Surgeon: Milus Banister, MD;  Location: [facility];  Service: Endoscopy;  Laterality: N/A;  . ESOPHAGOGASTRODUODENOSCOPY N/A 03/09/2014   Procedure: ESOPHAGOGASTRODUODENOSCOPY (EGD);  Surgeon: Inda Castle, MD;  Location: [facility];  Service: Endoscopy;  Laterality: N/A;  . ESOPHAGOGASTRODUODENOSCOPY N/A 08/01/2014   Procedure: ESOPHAGOGASTRODUODENOSCOPY (EGD);  Surgeon: Jerene Bears, MD;  Location: [facility] ENDOSCOPY;  Service: Endoscopy;  Laterality: N/A;  LVAD patient  . GIVENS CAPSULE STUDY N/A 03/10/2014   Procedure: GIVENS CAPSULE STUDY;  Surgeon: Inda Castle, MD;  Location: [place] [facility];  Service: Endoscopy;  Laterality: N/A;  . GIVENS CAPSULE STUDY N/A 07/30/2014   Procedure: GIVENS CAPSULE STUDY;  Surgeon: Gatha Mayer, MD;  Location: [facility];  Service: Endoscopy;  Laterality: N/A;  . GIVENS CAPSULE STUDY N/A 12/11/2015   Procedure: GIVENS CAPSULE STUDY;  Surgeon: Gatha Mayer, MD;  Location: [facility]  ENDOSCOPY;  Service: Endoscopy;  Laterality: N/A;  . ICD LEAD REMOVAL Left 03/16/2015   Procedure: ICD LEAD REMOVAL/EXTRACTION;  Surgeon: Evans Lance, MD;  Location: [facility];  Service: Cardiovascular;  Laterality: Left;  PATIENT HAS LVAD  DR. VAN TRIGT TO BACK UP EXTRACTION  . INSERTION OF IMPLANTABLE LEFT VENTRICULAR ASSIST DEVICE N/A 01/12/2013   Procedure: INSERTION OF IMPLANTABLE LEFT VENTRICULAR ASSIST DEVICE;  Surgeon: Ivin Poot, MD;  Location: [facility];  Service: Open Heart Surgery;  Laterality: N/A;   nitric oxide; Redo sternotomy  . INTRAOPERATIVE TRANSESOPHAGEAL ECHOCARDIOGRAM N/A 01/12/2013   Procedure: INTRAOPERATIVE TRANSESOPHAGEAL ECHOCARDIOGRAM;  Surgeon: Ivin Poot, MD;  Location: [facility];  Service: Open Heart Surgery;  Laterality: N/A;  . LEFT AND RIGHT HEART CATHETERIZATION WITH CORONARY/GRAFT ANGIOGRAM  01/07/2013   Procedure: LEFT AND RIGHT HEART CATHETERIZATION  WITH Beatrix Fetters;  Surgeon: Jolaine Artist, MD;  Location: [facility] CATH LAB;  Service: Cardiovascular;;       Home Medications    Prior to Admission medications   Medication Sig Start Date End Date Taking? Authorizing Provider  albuterol (PROAIR HFA) 108 (90 Base) MCG/ACT inhaler Inhale 2 puffs into the lungs every 6 (six) hours as needed for wheezing or shortness of breath.   Yes Historical Provider, MD  albuterol (PROVENTIL) (2.5 MG/3ML) 0.083% nebulizer solution Take 2.5 mg by nebulization every 6 (six) hours as needed for wheezing or shortness of breath.   Yes Historical Provider, MD  amiodarone (PACERONE) 200 MG tablet Take 1 tablet (200 mg total) by mouth every evening. Patient taking differently: Take 200 mg by mouth at bedtime.  03/29/16  Yes Larey Dresser, MD  apixaban (ELIQUIS) 5 MG TABS tablet Take 1 tablet (5 mg total) by mouth 2 (two) times daily. 12/25/15  Yes Jolaine Artist, MD  budesonide-formoterol [facility]) 160-4.5 MCG/ACT inhaler Inhale 2 puffs into the lungs 2 (two) times daily. 01/10/15  Yes Jolaine Artist, MD  cholecalciferol (VITAMIN D) 1000 UNITS tablet Take 1 tablet (1,000 Units total) by mouth 2 (two) times daily. 01/10/15  Yes Jolaine Artist, MD  citalopram (CELEXA) 40 MG tablet Take 1 tablet (40 mg total) by mouth at bedtime. 01/10/15  Yes Jolaine Artist, MD  docusate sodium (COLACE) 100 MG capsule Take 100 mg by mouth 2 (two) times daily.    Yes Historical Provider, MD  furosemide (LASIX) 40 MG tablet Use only if a weight gain of 5 pounds in a week occurs or evident swelling is present Patient taking differently: Take 40 mg by mouth daily as needed. Use only if a weight gain of 5 pounds in a week occurs or evident swelling is present 03/29/16  Yes Larey Dresser, MD  hydrocortisone (ANUSOL-HC) 2.5 % rectal cream Place 1 application rectally 2 (two) times daily. Patient taking differently: Place 1 application rectally daily as needed for  hemorrhoids or itching. As needed 11/21/15  Yes Larey Dresser, MD  levothyroxine (SYNTHROID, LEVOTHROID) 100 MCG tablet Take 1 tablet (100 mcg total) by mouth at bedtime. 02/23/16  Yes Amy D Clegg, NP  losartan (COZAAR) 25 MG tablet Take 25 mg by mouth at bedtime.   Yes Historical Provider, MD  Multiple Vitamin (MULTIVITAMIN WITH MINERALS) TABS tablet Take 1 tablet by mouth daily.   Yes Historical Provider, MD  nitroGLYCERIN (NITROSTAT) 0.4 MG SL tablet Place 0.4 mg under the tongue every 5 (five) minutes as needed for chest pain. Reported on 04/09/2016   Yes Historical Provider, MD  pantoprazole (PROTONIX) 40 MG tablet Take 1 tablet (40 mg total) by mouth 2 (two) times daily. 01/10/15  Yes Shaune Pascal Bensimhon, MD  potassium chloride SA (K-DUR,KLOR-CON) 20 MEQ tablet Take 2 tablets (40 mEq total) by mouth daily. And take extra 1 pill with lasix when needed. 04/30/16  Yes Amy D Clegg, NP  PRESCRIPTION MEDICATION Ferumoxytol (FERAHEME) 510 mg in sodium chloride 0.9 % 100 mL IVPB (Once) Route: Intravenous as needed (checked every 2 weeks)   Yes Historical Provider, MD  PRESCRIPTION MEDICATION Octreotide (SANDOSTATIN LAR) IM injection 20 mg (Once)Route: Intramuscular every 28 days   Yes Historical Provider, MD  simvastatin (ZOCOR) 80 MG tablet Take 0.5 tablets (40 mg total) by mouth at bedtime. 01/10/15  Yes Jolaine Artist, MD  tiotropium (SPIRIVA) 18 MCG inhalation capsule Place 1 capsule (18 mcg total) into inhaler and inhale daily. 01/10/15  Yes Jolaine Artist, MD  vitamin B-12 (CYANOCOBALAMIN) 1000 MCG tablet Take 1,000 mcg by mouth at bedtime.   Yes Historical Provider, MD  vitamin C (ASCORBIC ACID) 500 MG tablet Take 1 tablet (500 mg total) by mouth 2 (two) times daily. 01/10/15  Yes Jolaine Artist, MD  oxyCODONE-acetaminophen (PERCOCET) 5-325 MG tablet Take 1 tablet by mouth every 4 (four) hours as needed for moderate pain. Patient not taking: Reported on 07/21/2016 123456   Delora Fuel, MD    sulfamethoxazole-trimethoprim (BACTRIM DS,SEPTRA DS) 800-160 MG tablet Take 1 tablet by mouth 2 (two) times daily. Patient not taking: Reported on 07/21/2016 07/18/16 123456  Delora Fuel, MD    Family History Family History  Problem Relation Age of Onset  . Heart attack Mother   . Heart attack Father     Social History Social History  Substance Use Topics  . Smoking status: Former Smoker    Packs/day: 2.00    Years: 45.00    Types: Cigarettes    Quit date: 11/11/2001  . Smokeless tobacco: Never Used  . Alcohol use Yes     Comment: 05/24/2013 "use to drink beer; hardly nothing since 2003; nothing at all since 12/2011 when I got my LVAD  "     Allergies   Tape   Review of Systems Review of Systems  Constitutional: Negative for activity change, appetite change, chills, fatigue and fever.  HENT: Positive for nosebleeds. Negative for ear pain, rhinorrhea, sore throat and trouble swallowing.   Respiratory: Negative for cough, chest tightness and shortness of breath.   Cardiovascular: Negative for chest pain.  Gastrointestinal: Negative for abdominal pain, nausea and vomiting.  Musculoskeletal: Negative for back pain and neck pain.  Skin: Negative for rash and wound.  Neurological: Negative for dizziness, syncope, weakness, light-headedness, numbness and headaches.  All other systems reviewed and are negative.    Physical Exam Updated Vital Signs BP (!) 86/69 (BP Location: Right Arm) Comment: LVAD patient  Pulse 87   Temp 98.1 F (36.7 C) (Oral)   Resp 17   SpO2 95%   Physical Exam  Constitutional: He is oriented to person, place, and time. He appears well-developed and well-nourished. No distress.  HENT:  Head: Normocephalic.  Right Ear: External ear normal.  Left Ear: External ear normal.  Nose: Nose normal.  Mouth/Throat: Oropharynx is clear and moist.  rhinorocket in place in right nare with no noted bleeding around it. Small volume epistaxis noted in the  left nare, trickling down. Possibly from overflow from right nare if rhino rocket is saturated. No obvious mucosal injury in left nare  noted on exam.   Eyes: Conjunctivae and EOM are normal. Pupils are equal, round, and reactive to light.  Neck: Neck supple.  Cardiovascular:  LVAD in place and mechanical whirring noted on auscultation with no distal pulses palpated.   Pulmonary/Chest: Effort normal and breath sounds normal.  Abdominal: Soft. He exhibits no distension. There is no tenderness.  Musculoskeletal: He exhibits no edema or tenderness.  Neurological: He is alert and oriented to person, place, and time. No cranial nerve deficit. Coordination normal.  Skin: Skin is warm and dry. He is not diaphoretic.  Nursing note and vitals reviewed.    ED Treatments / Results  Labs (all labs ordered are listed, but only abnormal results are displayed) Labs Reviewed - No data to display  EKG  EKG Interpretation None       Radiology No results found.  Procedures Procedures (including critical care time)  Medications Ordered in ED Medications - No data to display   Initial Impression / Assessment and Plan / ED Course  I have reviewed the triage vital signs and the nursing notes.  Pertinent labs & imaging results that were available during my care of the patient were reviewed by me and considered in my medical decision making (see chart for details).  Clinical Course   75 y.o. male presents with recurrent epistaxis. Given small volume of bleeding feel that it may be overlow from the  Right side coming around the rhinorocket, or a small new anterior bleed. Low suspicion for a posterior nosebleed. Patient appears well, in NAD.   Direct pressure was placed using a clip fashioned by tongue depressors and the patient was given several squirts of afrin to the left nare. On reassessment after this treatment he was found to be no longer bleeding. Tongue depressors were removed and he was  monitored for any re-bleeding with no return of symptoms. He was given the bottle of afrin to use again at home, as well as the tongue depressors, in case his bleeding starts again. Strict return precautions were given for significant bleeding or concerns. He was recommended to keep the rhinorocket in place until Tuesday as originally planned. This plan was discussed with the patient and his wife at the bedside and they stated both understanding and agreement   LVAD coordinator was notified.   Final Clinical Impressions(s) / ED Diagnoses   Final diagnoses:  Epistaxis, recurrent  On anticoagulant therapy  LVAD (left ventricular assist device) present [facility])    New Prescriptions Discharge Medication List as of 07/21/2016  6:39 PM       Zenovia Jarred, DO 07/22/16 1222    Elnora Morrison, MD 07/27/16 [phone]

## 2016-07-22 ENCOUNTER — Telehealth (HOSPITAL_COMMUNITY): Payer: Self-pay | Admitting: Vascular Surgery

## 2016-07-22 NOTE — Telephone Encounter (Signed)
PT wife called wanted to know if someone can remove the packing in his nose tomorrow

## 2016-07-23 ENCOUNTER — Emergency Department (HOSPITAL_COMMUNITY)
Admission: EM | Admit: 2016-07-23 | Discharge: 2016-07-23 | Disposition: A | Discharge status: Home / Self Care | Payer: Medicare Other | Source: Home / Self Care | Attending: Emergency Medicine | Admitting: Emergency Medicine

## 2016-07-23 ENCOUNTER — Ambulatory Visit (HOSPITAL_COMMUNITY)
Admission: RE | Admit: 2016-07-23 | Discharge: 2016-07-23 | Disposition: A | Discharge status: Home / Self Care | Payer: Medicare Other | Source: Ambulatory Visit | Attending: General Surgery | Admitting: General Surgery

## 2016-07-23 ENCOUNTER — Encounter (HOSPITAL_COMMUNITY): Payer: Self-pay | Admitting: Emergency Medicine

## 2016-07-23 DIAGNOSIS — R040 Epistaxis: Secondary | ICD-10-CM

## 2016-07-23 DIAGNOSIS — K831 Obstruction of bile duct: Secondary | ICD-10-CM | POA: Diagnosis not present

## 2016-07-23 DIAGNOSIS — Z4803 Encounter for change or removal of drains: Secondary | ICD-10-CM | POA: Diagnosis present

## 2016-07-23 DIAGNOSIS — Z951 Presence of aortocoronary bypass graft: Secondary | ICD-10-CM

## 2016-07-23 DIAGNOSIS — Z7901 Long term (current) use of anticoagulants: Secondary | ICD-10-CM | POA: Insufficient documentation

## 2016-07-23 DIAGNOSIS — I110 Hypertensive heart disease with heart failure: Secondary | ICD-10-CM

## 2016-07-23 DIAGNOSIS — Z79899 Other long term (current) drug therapy: Secondary | ICD-10-CM | POA: Insufficient documentation

## 2016-07-23 DIAGNOSIS — I252 Old myocardial infarction: Secondary | ICD-10-CM | POA: Insufficient documentation

## 2016-07-23 DIAGNOSIS — E039 Hypothyroidism, unspecified: Secondary | ICD-10-CM | POA: Insufficient documentation

## 2016-07-23 DIAGNOSIS — Z87891 Personal history of nicotine dependence: Secondary | ICD-10-CM | POA: Insufficient documentation

## 2016-07-23 DIAGNOSIS — J449 Chronic obstructive pulmonary disease, unspecified: Secondary | ICD-10-CM | POA: Insufficient documentation

## 2016-07-23 DIAGNOSIS — Z8673 Personal history of transient ischemic attack (TIA), and cerebral infarction without residual deficits: Secondary | ICD-10-CM

## 2016-07-23 DIAGNOSIS — I2510 Atherosclerotic heart disease of native coronary artery without angina pectoris: Secondary | ICD-10-CM

## 2016-07-23 DIAGNOSIS — I5023 Acute on chronic systolic (congestive) heart failure: Secondary | ICD-10-CM | POA: Insufficient documentation

## 2016-07-23 DIAGNOSIS — K8021 Calculus of gallbladder without cholecystitis with obstruction: Secondary | ICD-10-CM

## 2016-07-23 HISTORY — PX: IR GENERIC HISTORICAL: IMG1180011

## 2016-07-23 MED ORDER — IOPAMIDOL (ISOVUE-300) INJECTION 61%
INTRAVENOUS | Status: AC
  Administered 2016-07-23: 10 mL
  Filled 2016-07-23: qty 50

## 2016-07-23 NOTE — ED Notes (Signed)
Spoke with Lavone Weisel, RN, LVAD RN.   She advised if we need them, to let them know.  Patient denies being here for anything other than having a rhino removed from R nostril.

## 2016-07-23 NOTE — Telephone Encounter (Signed)
Patient reported to ED this morning to have R nare packing removed and assessed for further nosebleeds if needed.  Renee Pain, RN

## 2016-07-23 NOTE — ED Triage Notes (Signed)
Patient has rhino placed in R nostril.  Patient here to have it removed.   Patient denies chest pain, SOB or any other problems.

## 2016-07-23 NOTE — ED Provider Notes (Signed)
[facility] DEPT Provider Note   CSN: [id number] Arrival date & time: 07/23/16  [id number]     History   Chief Complaint Chief Complaint  Patient presents with  . nasal problems    HPI [name] is a 75 y.o. male.  HPI  LVAD patient status post nosebleed on the right on October 19, returned on October 22 for a small amount of blood from the left [place], no further bleeding from either side, he has had the packing on the right for approximately 5 days and requesting that this be taken out. The patient is on Coumadin, he denies any nose bleeding in 2 days, no bleeding down the throat, no coughing up blood, no nausea.  Past Medical History:  Diagnosis Date  . Anemia   . Asbestosis(501)    "6 years in the [place]" (05/24/2013)  . Atrial fibrillation ([place])    permanent  . Cancer ([place])    "scraped some off behind my left ear; fast moving; having it cut out 12/05/2015" (11/07/2015)  . CHF (congestive heart failure) ([place])   . Chronic anticoagulation    Afib and LVAD  . Chronic systolic heart failure ([place])   . 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 Complication of anesthesia    "they can't put me all the way to sleep cause of my heart" (11/07/2015)  . COPD (chronic obstructive pulmonary disease) ([place])   . Coronary artery disease   . Depression   . Epistaxis 11/2015, 07/2014  . History of blood transfusion 08/2015 X 10; 11/2015 X 2   "related to bleeding on the inside somewhere" (11/07/2015)  . Hyperlipidemia   . Hypertension   . Hypothyroidism   . Ischemic cardiomyopathy     CABG 2003, PCI 2007  EF 27%(myoview 2012  . LVAD (left ventricular assist device) present ([place]) 12/2012  . Myocardial infarction 1990's-2000   "2 in  ~ the 1990's; 1 in ~ 2000" (11/07/2015)  . Obesity   . On home oxygen therapy    "have it available; don't use it" (11/07/2015)  . OSA on CPAP    no longer on cpap  . Paroxysmal ventricular tachycardia ([place])   . Restless leg  syndrome   . Shortness of breath dyspnea     Patient Active Problem List   Diagnosis Date Noted  . Common bile duct (CBD) obstruction   . Choledocholithiasis   . Chronic cholecystitis with calculus 04/23/2016  . Chronic anticoagulation 04/23/2016  . Septic shock ([place]) 03/03/2016  . Acute calculous cholecystitis 03/03/2016  . Chronic systolic (congestive) heart failure   . Hypotension 02/26/2016  . AVM (arteriovenous malformation) of colon with hemorrhage 02/23/2016  . Atrial flutter ([place]) 12/08/2015  . AVM (arteriovenous malformation) of small bowel, acquired ([place])   . Hemorrhoid   . BRBPR (bright red blood per rectum)   . Symptomatic anemia 11/07/2015  . Epistaxis, recurrent   . PAF (paroxysmal atrial fibrillation) ([place])   . Acute on chronic systolic (congestive) heart failure   . Thrombocytopenia ([place]) 08/10/2015  . Leg pain, left 07/15/2015  . Left ventricular assist device present ([place])   . Pacemaker complications [id number]  . Implantable cardioverter-defibrillator infection ([place]) 02/28/2015  . Anemia due to blood loss, acute   . GI bleed 09/25/2014  . Atrial fibrillation, unspecified   . Angiodysplasia of duodenum with hemorrhage 08/01/2014  . Chronic atrial fibrillation ([place])   . Intestinal bleeding   .  Acute blood loss anemia 07/23/2014  . Anemia 07/23/2014  . Benign neoplasm of colon 03/09/2014  . Internal hemorrhoids 03/09/2014  . Gastric erosions 03/09/2014  . GI bleeding 03/07/2014  . Anemia, iron deficiency 05/26/2013  . TIA (transient ischemic attack) 05/24/2013  . Essential hypertension 04/21/2013  . Reactive depression (situ[place]) 01/22/2013  . Incisional pain 01/14/2013  . LVAD (left ventricular assist device) present ([place]) 01/13/2013  . COPD (chronic obstructive pulmonary disease) ([place]) 01/11/2013  . Abnormal PFTs 01/11/2013  . Pulmonary hypertension 01/11/2013  . Chronic systolic heart failure ([place]) 07/29/2012  . Obstructive sleep apnea  07/02/2012  . Coronary artery disease 04/22/2011  . Ventricular tachycardia ([place]) 02/15/2011  . Ischemic cardiomyopathy   . ICD (implantable cardiac defibrillator) in place   . Atrial fibrillation [facility])     Past Surgical History:  Procedure Laterality Date  . APPENDECTOMY    . CARDIAC DEFIBRILLATOR PLACEMENT  2004; ~ 2010   "cut it out in 2016 after it got infected"   . COLONOSCOPY N/A 03/09/2014   Procedure: COLONOSCOPY;  Surgeon: Inda Castle, MD;  Location: [facility];  Service: Endoscopy;  Laterality: N/A;  LVAD  patient  . COLONOSCOPY N/A 07/29/2014   Procedure: COLONOSCOPY;  Surgeon: Gatha Mayer, MD;  Location: [facility];  Service: Endoscopy;  Laterality: N/A;  . CORONARY ARTERY BYPASS GRAFT  2003   "?X4" (05/24/2013)  . ENTEROSCOPY N/A 07/27/2014   Procedure: ENTEROSCOPY;  Surgeon: Gatha Mayer, MD;  Location: [facility] ENDOSCOPY;  Service: Endoscopy;  Laterality: N/A;  LVAD patient   . ENTEROSCOPY N/A 09/27/2014   Procedure: ENTEROSCOPY;  Surgeon: Gatha Mayer, MD;  Location[facility] ENDOSCOPY;  Service: Endoscopy;  Laterality: N/A;  . ENTEROSCOPY N/A 11/09/2015   Procedure: ENTEROSCOPY;  Surgeon: Mauri Pole, MD;  Location: [facility] ENDOSCOPY;  Service: Endoscopy;  Laterality: N/A;  LVAD  . ENTEROSCOPY N/A 12/13/2015   Procedure: ENTEROSCOPY;  Surgeon: Milus Banister, MD;  Location: [facility];  Service: Endoscopy;  Laterality: N/A;  . ENTEROSCOPY N/A 02/21/2016   Procedure: ENTEROSCOPY;  Surgeon: Milus Banister, MD;  Location: [facility];  Service: Endoscopy;  Laterality: N/A;  . ERCP N/A 06/13/2016   Procedure: ENDOSCOPIC RETROGRADE CHOLANGIOPANCREATOGRAPHY (ERCP);  Surgeon: Milus Banister, MD;  Location: [facility];  Service: Endoscopy;  Laterality: N/A;  . ESOPHAGOGASTRODUODENOSCOPY N/A 03/09/2014   Procedure: ESOPHAGOGASTRODUODENOSCOPY (EGD);  Surgeon: Inda Castle, MD;  Location: [facility];  Service: Endoscopy;  Laterality: N/A;  .  ESOPHAGOGASTRODUODENOSCOPY N/A 08/01/2014   Procedure: ESOPHAGOGASTRODUODENOSCOPY (EGD);  Surgeon: Jerene Bears, MD;  Location[facility] ENDOSCOPY;  Service: Endoscopy;  Laterality: N/A;  LVAD patient  . GIVENS CAPSULE STUDY N/A 03/10/2014   Procedure: GIVENS CAPSULE STUDY;  Surgeon: Inda Castle, MD;  Location: [facility];  Service: Endoscopy;  Laterality: N/A;  . GIVENS CAPSULE STUDY N/A 07/30/2014   Procedure: GIVENS CAPSULE STUDY;  Surgeon: Gatha Mayer, MD;  Location: [facility];  Service: Endoscopy;  Laterality: N/A;  . GIVENS CAPSULE STUDY N/A 12/11/2015   Procedure: GIVENS CAPSULE STUDY;  Surgeon: Gatha Mayer, MD;  Location: [facility];  Service: Endoscopy;  Laterality: N/A;  . ICD LEAD REMOVAL Left 03/16/2015   Procedure: ICD LEAD REMOVAL/EXTRACTION;  Surgeon: Evans Lance, MD;  Location: [facility];  Service: Cardiovascular;  Laterality: Left;  PATIENT HAS LVAD  DR. VAN TRIGT TO BACK UP EXTRACTION  . INSERTION OF IMPLANTABLE LEFT VENTRICULAR ASSIST DEVICE N/A 01/12/2013   Procedure: INSERTION OF IMPLANTABLE LEFT VENTRICULAR  ASSIST DEVICE;  Surgeon: Ivin Poot, MD;  Location: [facility];  Service: Open Heart Surgery;  Laterality: N/A;   nitric oxide; Redo sternotomy  . INTRAOPERATIVE TRANSESOPHAGEAL ECHOCARDIOGRAM N/A 01/12/2013   Procedure: INTRAOPERATIVE TRANSESOPHAGEAL ECHOCARDIOGRAM;  Surgeon: Ivin Poot, MD;  Location: [facility];  Service: Open Heart Surgery;  Laterality: N/A;  . LEFT AND RIGHT HEART CATHETERIZATION WITH CORONARY/GRAFT ANGIOGRAM  01/07/2013   Procedure: LEFT AND RIGHT HEART CATHETERIZATION WITH Beatrix Fetters;  Surgeon: Jolaine Artist, MD;  Location: [facility] CATH LAB;  Service: Cardiovascular;;       Home Medications    Prior to Admission medications   Medication Sig Start Date End Date Taking? Authorizing Provider  albuterol (PROAIR HFA) 108 (90 Base) MCG/ACT inhaler Inhale 2 puffs into the lungs every 6 (six) hours as needed for wheezing or  shortness of breath.   Yes Historical Provider, MD  albuterol (PROVENTIL) (2.5 MG/3ML) 0.083% nebulizer solution Take 2.5 mg by nebulization every 6 (six) hours as needed for wheezing or shortness of breath.   Yes Historical Provider, MD  amiodarone (PACERONE) 200 MG tablet Take 1 tablet (200 mg total) by mouth every evening. Patient taking differently: Take 200 mg by mouth at bedtime.  03/29/16  Yes Larey Dresser, MD  apixaban (ELIQUIS) 5 MG TABS tablet Take 1 tablet (5 mg total) by mouth 2 (two) times daily. 12/25/15  Yes Jolaine Artist, MD  budesonide-formoterol [facility]) 160-4.5 MCG/ACT inhaler Inhale 2 puffs into the lungs 2 (two) times daily. 01/10/15  Yes Jolaine Artist, MD  cholecalciferol (VITAMIN D) 1000 UNITS tablet Take 1 tablet (1,000 Units total) by mouth 2 (two) times daily. 01/10/15  Yes Jolaine Artist, MD  citalopram (CELEXA) 40 MG tablet Take 1 tablet (40 mg total) by mouth at bedtime. 01/10/15  Yes Jolaine Artist, MD  docusate sodium (COLACE) 100 MG capsule Take 100 mg by mouth 2 (two) times daily.    Yes Historical Provider, MD  furosemide (LASIX) 40 MG tablet Use only if a weight gain of 5 pounds in a week occurs or evident swelling is present Patient taking differently: Take 40 mg by mouth daily as needed. Use only if a weight gain of 5 pounds in a week occurs or evident swelling is present 03/29/16  Yes Larey Dresser, MD  hydrocortisone (ANUSOL-HC) 2.5 % rectal cream Place 1 application rectally 2 (two) times daily. Patient taking differently: Place 1 application rectally daily as needed for hemorrhoids or itching. As needed 11/21/15  Yes Larey Dresser, MD  levothyroxine (SYNTHROID, LEVOTHROID) 100 MCG tablet Take 1 tablet (100 mcg total) by mouth at bedtime. 02/23/16  Yes Amy D Clegg, NP  losartan (COZAAR) 25 MG tablet Take 25 mg by mouth at bedtime.   Yes Historical Provider, MD  Multiple Vitamin (MULTIVITAMIN WITH MINERALS) TABS tablet Take 1 tablet by mouth  daily.   Yes Historical Provider, MD  nitroGLYCERIN (NITROSTAT) 0.4 MG SL tablet Place 0.4 mg under the tongue every 5 (five) minutes as needed for chest pain. Reported on 04/09/2016   Yes Historical Provider, MD  pantoprazole (PROTONIX) 40 MG tablet Take 1 tablet (40 mg total) by mouth 2 (two) times daily. 01/10/15  Yes Shaune Pascal Bensimhon, MD  potassium chloride SA (K-DUR,KLOR-CON) 20 MEQ tablet Take 2 tablets (40 mEq total) by mouth daily. And take extra 1 pill with lasix when needed. 04/30/16  Yes Amy D Clegg, NP  PRESCRIPTION MEDICATION Ferumoxytol (FERAHEME) 510 mg in sodium  chloride 0.9 % 100 mL IVPB (Once) Route: Intravenous as needed (checked every 2 weeks)   Yes Historical Provider, MD  PRESCRIPTION MEDICATION Octreotide (SANDOSTATIN LAR) IM injection 20 mg (Once)Route: Intramuscular every 28 days   Yes Historical Provider, MD  simvastatin (ZOCOR) 80 MG tablet Take 0.5 tablets (40 mg total) by mouth at bedtime. 01/10/15  Yes Jolaine Artist, MD  tiotropium (SPIRIVA) 18 MCG inhalation capsule Place 1 capsule (18 mcg total) into inhaler and inhale daily. 01/10/15  Yes Jolaine Artist, MD  vitamin B-12 (CYANOCOBALAMIN) 1000 MCG tablet Take 1,000 mcg by mouth at bedtime.   Yes Historical Provider, MD  vitamin C (ASCORBIC ACID) 500 MG tablet Take 1 tablet (500 mg total) by mouth 2 (two) times daily. 01/10/15  Yes Jolaine Artist, MD    Family History Family History  Problem Relation Age of Onset  . Heart attack Mother   . Heart attack Father     Social History Social History  Substance Use Topics  . Smoking status: Former Smoker    Packs/day: 2.00    Years: 45.00    Types: Cigarettes    Quit date: 11/11/2001  . Smokeless tobacco: Never Used  . Alcohol use No     Comment: 05/24/2013 "use to drink beer; hardly nothing since 2003; nothing at all since 12/2011 when I got my LVAD  "     Allergies   Tape   Review of Systems Review of Systems  HENT: Negative for nosebleeds,  postnasal drip and rhinorrhea.   Respiratory:       Negative for hemoptysis  Gastrointestinal: Negative for nausea and vomiting.     Physical Exam Updated Vital Signs BP 92/80 (BP Location: Right Arm)   Pulse 87   Temp 97.5 F (36.4 C) (Oral)   Resp 24   Ht 6' (1.829 m)   Wt 210 lb (95.3 kg)   SpO2 96%   BMI 28.48 kg/m   Physical Exam  Constitutional: He appears well-developed and well-nourished.  HENT:  Head: Normocephalic and atraumatic.  Packing in the R nare, clear L nare, clear OP  Eyes: Conjunctivae are normal. Right eye exhibits no discharge. Left eye exhibits no discharge.  Cardiovascular:  Hum of the LVAD  Pulmonary/Chest: Effort normal. No respiratory distress.  Neurological: He is alert. Coordination normal.  Skin: Skin is warm and dry. No rash noted. He is not diaphoretic. No erythema.  Psychiatric: He has a normal mood and affect.  Nursing note and vitals reviewed.    ED Treatments / Results  Labs (all labs ordered are listed, but only abnormal results are displayed) Labs Reviewed - No data to display   Radiology No results found.  Procedures Procedures (including critical care time)  Medications Ordered in ED Medications - No data to display   Initial Impression / Assessment and Plan / ED Course  I have reviewed the triage vital signs and the nursing notes.  Pertinent labs & imaging results that were available during my care of the patient were reviewed by me and considered in my medical decision making (see chart for details).  Clinical Course    Successfully removed packing, no bleeding Stable for d/c.  Final Clinical Impressions(s) / ED Diagnoses   Final diagnoses:  Bleeding from the nose    New Prescriptions Current Discharge Medication List       Noemi Chapel, MD 07/23/16 [phone]

## 2016-07-23 NOTE — Discharge Instructions (Signed)
Return if nosebleeds reccurr

## 2016-07-23 NOTE — ED Notes (Signed)
ENT cart at bedside

## 2016-07-24 ENCOUNTER — Other Ambulatory Visit (HOSPITAL_COMMUNITY): Payer: Self-pay | Admitting: Infectious Diseases

## 2016-07-24 DIAGNOSIS — Z95811 Presence of heart assist device: Secondary | ICD-10-CM

## 2016-07-24 DIAGNOSIS — Z8774 Personal history of (corrected) congenital malformations of heart and circulatory system: Secondary | ICD-10-CM

## 2016-07-24 MED ORDER — OCTREOTIDE ACETATE 20 MG IM KIT: 20.0000 mg | PACK | INTRAMUSCULAR | 5 refills | Status: AC

## 2016-07-25 ENCOUNTER — Ambulatory Visit (HOSPITAL_BASED_OUTPATIENT_CLINIC_OR_DEPARTMENT_OTHER): Payer: No Typology Code available for payment source

## 2016-07-25 VITALS — BP 85/73 | HR 68 | Temp 98.0°F | Resp 24

## 2016-07-25 DIAGNOSIS — D62 Acute posthemorrhagic anemia: Secondary | ICD-10-CM

## 2016-07-25 DIAGNOSIS — K922 Gastrointestinal hemorrhage, unspecified: Secondary | ICD-10-CM

## 2016-07-25 DIAGNOSIS — D500 Iron deficiency anemia secondary to blood loss (chronic): Secondary | ICD-10-CM

## 2016-07-25 MED ORDER — SODIUM CHLORIDE 0.9 % IV SOLN
510.0000 mg | Freq: Once | INTRAVENOUS | Status: AC
  Administered 2016-07-25: 510 mg via INTRAVENOUS
  Filled 2016-07-25: qty 17

## 2016-07-25 MED ORDER — SODIUM CHLORIDE 0.9 % IV SOLN
Freq: Once | INTRAVENOUS | Status: AC
  Administered 2016-07-25: 15:00:00 via INTRAVENOUS

## 2016-07-25 NOTE — Patient Instructions (Signed)

## 2016-07-26 ENCOUNTER — Telehealth (HOSPITAL_COMMUNITY): Payer: Self-pay | Admitting: Surgery

## 2016-07-26 NOTE — Telephone Encounter (Signed)
I called to tell [name] and his wife that I had faxed information to the [place] PCP for Octreotide renewal.  She tells me that his PCP has been changed to a Dr Percell Miller in the same physician group.  Janene Madeira --VAD Coordinator aware.

## 2016-08-01 ENCOUNTER — Ambulatory Visit (HOSPITAL_BASED_OUTPATIENT_CLINIC_OR_DEPARTMENT_OTHER): Payer: No Typology Code available for payment source | Admitting: Hematology

## 2016-08-01 ENCOUNTER — Encounter: Payer: Self-pay | Admitting: Hematology

## 2016-08-01 ENCOUNTER — Telehealth: Payer: Self-pay | Admitting: Hematology

## 2016-08-01 ENCOUNTER — Ambulatory Visit (HOSPITAL_BASED_OUTPATIENT_CLINIC_OR_DEPARTMENT_OTHER): Payer: No Typology Code available for payment source

## 2016-08-01 ENCOUNTER — Other Ambulatory Visit (HOSPITAL_BASED_OUTPATIENT_CLINIC_OR_DEPARTMENT_OTHER): Payer: No Typology Code available for payment source

## 2016-08-01 ENCOUNTER — Ambulatory Visit: Payer: No Typology Code available for payment source

## 2016-08-01 VITALS — BP 84/69 | HR 74 | Temp 97.6°F | Resp 18 | Ht 72.0 in | Wt 212.7 lb

## 2016-08-01 DIAGNOSIS — D62 Acute posthemorrhagic anemia: Secondary | ICD-10-CM

## 2016-08-01 DIAGNOSIS — Z95811 Presence of heart assist device: Secondary | ICD-10-CM | POA: Diagnosis not present

## 2016-08-01 DIAGNOSIS — I255 Ischemic cardiomyopathy: Secondary | ICD-10-CM

## 2016-08-01 DIAGNOSIS — K598 Other specified functional intestinal disorders: Secondary | ICD-10-CM

## 2016-08-01 DIAGNOSIS — D500 Iron deficiency anemia secondary to blood loss (chronic): Secondary | ICD-10-CM | POA: Diagnosis not present

## 2016-08-01 DIAGNOSIS — D696 Thrombocytopenia, unspecified: Secondary | ICD-10-CM | POA: Diagnosis not present

## 2016-08-01 DIAGNOSIS — K922 Gastrointestinal hemorrhage, unspecified: Secondary | ICD-10-CM | POA: Diagnosis not present

## 2016-08-01 DIAGNOSIS — D509 Iron deficiency anemia, unspecified: Secondary | ICD-10-CM | POA: Diagnosis not present

## 2016-08-01 LAB — IRON AND TIBC
%SAT: 18 % — AB (ref 20–55)
IRON: 52 ug/dL (ref 42–163)
TIBC: 294 ug/dL (ref 202–409)
UIBC: 242 ug/dL (ref 117–376)

## 2016-08-01 LAB — CBC & DIFF AND RETIC
BASO%: 0.3 % (ref 0.0–2.0)
Basophils Absolute: 0 10*3/uL (ref 0.0–0.1)
EOS%: 4 % (ref 0.0–7.0)
Eosinophils Absolute: 0.3 10*3/uL (ref 0.0–0.5)
HCT: 34.3 % — ABNORMAL LOW (ref 38.4–49.9)
HEMOGLOBIN: 10.5 g/dL — AB (ref 13.0–17.1)
IMMATURE RETIC FRACT: 17 % — AB (ref 3.00–10.60)
LYMPH%: 10.1 % — AB (ref 14.0–49.0)
MCH: 30.9 pg (ref 27.2–33.4)
MCHC: 30.6 g/dL — ABNORMAL LOW (ref 32.0–36.0)
MCV: 100.9 fL — AB (ref 79.3–98.0)
MONO#: 0.4 10*3/uL (ref 0.1–0.9)
MONO%: 5.4 % (ref 0.0–14.0)
NEUT#: 5.2 10*3/uL (ref 1.5–6.5)
NEUT%: 80.2 % — AB (ref 39.0–75.0)
PLATELETS: 136 10*3/uL — AB (ref 140–400)
RBC: 3.4 10*6/uL — AB (ref 4.20–5.82)
RDW: 18.8 % — ABNORMAL HIGH (ref 11.0–14.6)
Retic %: 5.59 % — ABNORMAL HIGH (ref 0.80–1.80)
Retic Ct Abs: 190.06 10*3/uL — ABNORMAL HIGH (ref 34.80–93.90)
WBC: 6.5 10*3/uL (ref 4.0–10.3)
lymph#: 0.7 10*3/uL — ABNORMAL LOW (ref 0.9–3.3)

## 2016-08-01 LAB — COMPREHENSIVE METABOLIC PANEL
ALT: 17 U/L (ref 0–55)
AST: 43 U/L — ABNORMAL HIGH (ref 5–34)
Albumin: 3.2 g/dL — ABNORMAL LOW (ref 3.5–5.0)
Alkaline Phosphatase: 92 U/L (ref 40–150)
Anion Gap: 8 mEq/L (ref 3–11)
BUN: 18.2 mg/dL (ref 7.0–26.0)
CO2: 21 mEq/L — ABNORMAL LOW (ref 22–29)
Calcium: 9.6 mg/dL (ref 8.4–10.4)
Chloride: 112 mEq/L — ABNORMAL HIGH (ref 98–109)
Creatinine: 0.8 mg/dL (ref 0.7–1.3)
EGFR: 86 mL/min/{1.73_m2} — ABNORMAL LOW (ref >90)
Glucose: 152 mg/dl — ABNORMAL HIGH (ref 70–140)
Potassium: 4.2 mEq/L (ref 3.5–5.1)
Sodium: 140 mEq/L (ref 136–145)
Total Bilirubin: 0.47 mg/dL (ref 0.20–1.20)
Total Protein: 6.6 g/dL (ref 6.4–8.3)

## 2016-08-01 LAB — FERRITIN: Ferritin: 186 ng/ml (ref 22–316)

## 2016-08-01 MED ORDER — SODIUM CHLORIDE 0.9 % IV SOLN
510.0000 mg | Freq: Once | INTRAVENOUS | Status: AC
  Administered 2016-08-01: 510 mg via INTRAVENOUS
  Filled 2016-08-01: qty 17

## 2016-08-01 MED ORDER — OCTREOTIDE ACETATE 20 MG IM KIT
20.0000 mg | PACK | Freq: Once | INTRAMUSCULAR | Status: AC
  Administered 2016-08-01: 20 mg via INTRAMUSCULAR
  Filled 2016-08-01: qty 1

## 2016-08-01 NOTE — Progress Notes (Signed)
[place]  [phone]) [phone] Fax:(336) [phone]  Clinic Follow Up Note   Patient Care Team: Thressa Sheller, MD as PCP - General (Internal Medicine) Jolaine Artist, MD as Attending Physician (Cardiology) Ivin Poot, MD as Attending Physician (Cardiothoracic Surgery) 08/01/2016  CHIEF COMPLAINTS:  Follow up anemia  HISTORY OF PRESENTING ILLNESS (11/01/2014):  [name] 75 y.o. male is here because of anemia.   He has extensive cardiac history. He had multiple heart attacks in the past 15 years, he had cardiac bypass surgery in 2003, PCI in 2007, long-standing history of , a atrial fibrillationnd was found to have ischemic cardiomyopathy with EF 27% in 2012.  He initially had AICD placed, and finally had LVAD placed in 2014 for his end-stage heart failure. He has been on Coumadin for more than 10 years for the cardiac issues.   He reports he has had anemia for several years, he used to take iron pill. He also received some IV iron a few years ago. In our electronic medical records, his lab showed anemia since 12/2012.   He had 3 episodes of GI bleeding since June of 2015 which are required blood transfusion, last episode was one month ago and he required 4u RBC  at that time. His EGD showed area of bleeding from duodenum all jejunum and he was treated endoscopyly.   He feels well overall. He denies significant chest pain or abdominal discomfort. He has mild dyspnea on moderate exertion. He has mild to moderate fatigue, but is able to function well at home,  does all his ADLs and daily activities.  TREATMENT: 1. IV Feraheme 510 mg as needed, average every 4-8 weeks, blood transfusion as needed if Hb<9 2. 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 He is doing well overall. He denies any black stool or hematochezia. He had 2 episodes  of nose bleeding in the past month, it lasts more than an hour, and he went to emergency room for that. He denies any other mucosal bleeding. He has good appetite, energy level is fair. No other new complaints.  MEDICAL HISTORY:  Past Medical History  Diagnosis Date  . Ischemic cardiomyopathy      CABG 2003, PCI 2007  EF 27%(myoview 2012  . Chronic systolic heart failure   . Hyperlipidemia   . Hypothyroidism   . Chronic anticoagulation     Afib and LVAD in 2014   . Obesity   . COPD (chronic obstructive pulmonary disease)   . Asbestosis(501)     "6 years in the [place]" (05/24/2013)  . Atrial fibrillation     permanent  . Paroxysmal ventricular tachycardia   . Coronary artery disease   . Implantable cardioverter-defibrillator [employer]   . Hypertension   . Myocardial infarction 1990's-2000    "2 in  ~ the 1990's; 1 in ~ 2000" (05/24/2013)  . OSA on CPAP   . Depression   . LVAD (left ventricular assist device) present 12/2012    SURGICAL HISTORY: Past Surgical History:  Procedure Laterality Date  . APPENDECTOMY    . CARDIAC DEFIBRILLATOR PLACEMENT  2004; ~ 2010   "cut it out in 2016 after it got infected"   . COLONOSCOPY N/A 03/09/2014   Procedure: COLONOSCOPY;  Surgeon: Inda Castle, MD;  Location: [place];  Service: Endoscopy;  Laterality: N/A;  LVAD  patient  . COLONOSCOPY N/A  07/29/2014   Procedure: COLONOSCOPY;  Surgeon: Gatha Mayer, MD;  Location: [place];  Service: Endoscopy;  Laterality: N/A;  . CORONARY ARTERY BYPASS GRAFT  2003   "?X4" (05/24/2013)  . ENTEROSCOPY N/A 07/27/2014   Procedure: ENTEROSCOPY;  Surgeon: Gatha Mayer, MD;  Location: [facility] ENDOSCOPY;  Service: Endoscopy;  Laterality: N/A;  LVAD patient   . ENTEROSCOPY N/A 09/27/2014   Procedure: ENTEROSCOPY;  Surgeon: Gatha Mayer, MD;  Location: [facility] ENDOSCOPY;  Service: Endoscopy;  Laterality: N/A;  . ENTEROSCOPY N/A 11/09/2015   Procedure: ENTEROSCOPY;  Surgeon: Mauri Pole, MD;  Location:  [place] ENDOSCOPY;  Service: Endoscopy;  Laterality: N/A;  LVAD  . ENTEROSCOPY N/A 12/13/2015   Procedure: ENTEROSCOPY;  Surgeon: Milus Banister, MD;  Location: [place];  Service: Endoscopy;  Laterality: N/A;  . ENTEROSCOPY N/A 02/21/2016   Procedure: ENTEROSCOPY;  Surgeon: Milus Banister, MD;  Location: [place];  Service: Endoscopy;  Laterality: N/A;  . ERCP N/A 06/13/2016   Procedure: ENDOSCOPIC RETROGRADE CHOLANGIOPANCREATOGRAPHY (ERCP);  Surgeon: Milus Banister, MD;  Location: [place];  Service: Endoscopy;  Laterality: N/A;  . ESOPHAGOGASTRODUODENOSCOPY N/A 03/09/2014   Procedure: ESOPHAGOGASTRODUODENOSCOPY (EGD);  Surgeon: Inda Castle, MD;  Location: [place];  Service: Endoscopy;  Laterality: N/A;  . ESOPHAGOGASTRODUODENOSCOPY N/A 08/01/2014   Procedure: ESOPHAGOGASTRODUODENOSCOPY (EGD);  Surgeon: Jerene Bears, MD;  Location: [facility] ENDOSCOPY;  Service: Endoscopy;  Laterality: N/A;  LVAD patient  . GIVENS CAPSULE STUDY N/A 03/10/2014   Procedure: GIVENS CAPSULE STUDY;  Surgeon: Inda Castle, MD;  Location: [place];  Service: Endoscopy;  Laterality: N/A;  . GIVENS CAPSULE STUDY N/A 07/30/2014   Procedure: GIVENS CAPSULE STUDY;  Surgeon: Gatha Mayer, MD;  Location: [place];  Service: Endoscopy;  Laterality: N/A;  . GIVENS CAPSULE STUDY N/A 12/11/2015   Procedure: GIVENS CAPSULE STUDY;  Surgeon: Gatha Mayer, MD;  Location: [place];  Service: Endoscopy;  Laterality: N/A;  . ICD LEAD REMOVAL Left 03/16/2015   Procedure: ICD LEAD REMOVAL/EXTRACTION;  Surgeon: Evans Lance, MD;  Location: [place];  Service: Cardiovascular;  Laterality: Left;  PATIENT HAS LVAD  DR. VAN TRIGT TO BACK UP EXTRACTION  . INSERTION OF IMPLANTABLE LEFT VENTRICULAR ASSIST DEVICE N/A 01/12/2013   Procedure: INSERTION OF IMPLANTABLE LEFT VENTRICULAR ASSIST DEVICE;  Surgeon: Ivin Poot, MD;  Location: [place];  Service: Open Heart Surgery;  Laterality: N/A;   nitric oxide; Redo sternotomy    . INTRAOPERATIVE TRANSESOPHAGEAL ECHOCARDIOGRAM N/A 01/12/2013   Procedure: INTRAOPERATIVE TRANSESOPHAGEAL ECHOCARDIOGRAM;  Surgeon: Ivin Poot, MD;  Location: [place];  Service: Open Heart Surgery;  Laterality: N/A;  . IR GENERIC HISTORICAL  07/23/2016   IR SINUS/FIST TUBE CHK-NON GI 07/23/2016 Arne Cleveland, MD [facility]-INTERV RAD  . LEFT AND RIGHT HEART CATHETERIZATION WITH CORONARY/GRAFT ANGIOGRAM  01/07/2013   Procedure: LEFT AND RIGHT HEART CATHETERIZATION WITH Beatrix Fetters;  Surgeon: Jolaine Artist, MD;  Location: [facility] CATH LAB;  Service: Cardiovascular;;    SOCIAL HISTORY: Social History   Social History  . Marital status: Married    Spouse name: N/A  . Number of children: N/A  . Years of education: N/A   Occupational History  . Not on file.   Social History Main Topics  . Smoking status: Former Smoker    Packs/day: 2.00    Years: 45.00    Types: Cigarettes    Quit date: 11/11/2001  . Smokeless tobacco: Never Used  . Alcohol use No  Comment: 05/24/2013 "use to drink beer; hardly nothing since 2003; nothing at all since 12/2011 when I got my LVAD  "  . Drug use: No  . Sexual activity: No   Other Topics Concern  . Not on file   Social History Narrative  . No narrative on file    FAMILY HISTORY: Family History  Problem Relation Age of Onset  . Heart attack Mother   . Heart attack Father     ALLERGIES:  is allergic to tape.  MEDICATIONS:  Current Outpatient Prescriptions  Medication Sig Dispense Refill  . albuterol (PROAIR HFA) 108 (90 Base) MCG/ACT inhaler Inhale 2 puffs into the lungs every 6 (six) hours as needed for wheezing or shortness of breath.    [name] albuterol (PROVENTIL) (2.5 MG/3ML) 0.083% nebulizer solution Take 2.5 mg by nebulization every 6 (six) hours as needed for wheezing or shortness of breath.    [name] amiodarone (PACERONE) 200 MG tablet Take 1 tablet (200 mg total) by mouth every evening. (Patient taking differently: Take 200 mg by  mouth at bedtime. ) 90 tablet 3  . apixaban (ELIQUIS) 5 MG TABS tablet Take 1 tablet (5 mg total) by mouth 2 (two) times daily. 60 tablet 11  . budesonide-formoterol (SYMBICORT) 160-4.5 MCG/ACT inhaler Inhale 2 puffs into the lungs 2 (two) times daily. 1 Inhaler 3  . cholecalciferol (VITAMIN D) 1000 UNITS tablet Take 1 tablet (1,000 Units total) by mouth 2 (two) times daily. 180 tablet 3  . citalopram (CELEXA) 40 MG tablet Take 1 tablet (40 mg total) by mouth at bedtime. 90 tablet 3  . docusate sodium (COLACE) 100 MG capsule Take 100 mg by mouth 2 (two) times daily.     . furosemide (LASIX) 40 MG tablet Use only if a weight gain of 5 pounds in a week occurs or evident swelling is present (Patient taking differently: Take 40 mg by mouth daily as needed. Use only if a weight gain of 5 pounds in a week occurs or evident swelling is present) 30 tablet 4  . hydrocortisone (ANUSOL-HC) 2.5 % rectal cream Place 1 application rectally 2 (two) times daily. (Patient taking differently: Place 1 application rectally daily as needed for hemorrhoids or itching. As needed) 30 g 6  . levothyroxine (SYNTHROID, LEVOTHROID) 100 MCG tablet Take 1 tablet (100 mcg total) by mouth at bedtime. 30 tablet 6  . losartan (COZAAR) 25 MG tablet Take 25 mg by mouth at bedtime.    . Multiple Vitamin (MULTIVITAMIN WITH MINERALS) TABS tablet Take 1 tablet by mouth daily.    . nitroGLYCERIN (NITROSTAT) 0.4 MG SL tablet Place 0.4 mg under the tongue every 5 (five) minutes as needed for chest pain. Reported on 04/09/2016    . [START ON 08/29/2016] octreotide (SANDOSTATIN LAR) 20 MG injection Inject 20 mg into the muscle every 28 (twenty-eight) days. 1 each 5  . pantoprazole (PROTONIX) 40 MG tablet Take 1 tablet (40 mg total) by mouth 2 (two) times daily. 180 tablet 3  . potassium chloride SA (K-DUR,KLOR-CON) 20 MEQ tablet Take 2 tablets (40 mEq total) by mouth daily. And take extra 1 pill with lasix when needed. 80 tablet 4  . PRESCRIPTION  MEDICATION Ferumoxytol (FERAHEME) 510 mg in sodium chloride 0.9 % 100 mL IVPB (Once) Route: Intravenous as needed (checked every 2 weeks)    . PRESCRIPTION MEDICATION Octreotide (SANDOSTATIN LAR) IM injection 20 mg (Once)Route: Intramuscular every 28 days    . simvastatin (ZOCOR) 80 MG tablet Take 0.5 tablets (  40 mg total) by mouth at bedtime. 45 tablet 3  . tiotropium (SPIRIVA) 18 MCG inhalation capsule Place 1 capsule (18 mcg total) into inhaler and inhale daily. 90 capsule 3  . vitamin B-12 (CYANOCOBALAMIN) 1000 MCG tablet Take 1,000 mcg by mouth at bedtime.    . vitamin C (ASCORBIC ACID) 500 MG tablet Take 1 tablet (500 mg total) by mouth 2 (two) times daily. 180 tablet 3   No current facility-administered medications for this visit.     REVIEW OF SYSTEMS:   Constitutional: Denies fevers, chills or abnormal night sweats Eyes: Denies blurriness of vision, double vision or watery eyes Ears, nose, mouth, throat, and face: Denies mucositis or sore throat Respiratory: Denies cough, dyspnea or wheezes Cardiovascular: Denies palpitation, chest discomfort or lower extremity swelling Gastrointestinal:  Denies nausea, heartburn or change in bowel habits Skin: Denies abnormal skin rashes Lymphatics: Denies new lymphadenopathy or easy bruising Neurological:Denies numbness, tingling or new weaknesses Behavioral/Psych: Mood is stable, no new changes  All other systems were reviewed with the patient and are negative.  PHYSICAL EXAMINATION: ECOG PERFORMANCE STATUS: 1 - Symptomatic but completely ambulatory  Vitals:   08/01/16 1305  BP: (!) 84/69  Pulse: 74  Resp: 18  Temp: 97.6 F (36.4 C)   Filed Weights   08/01/16 1305  Weight: 212 lb 11.2 oz (96.5 kg)    GENERAL:alert, no distress and comfortable SKIN: skin color, texture, turgor are normal, no rashes or significant lesions EYES: normal, conjunctiva are pink and non-injected, sclera clear OROPHARYNX:no exudate, no erythema and lips,  buccal mucosa, and tongue normal  NECK: supple, thyroid normal size, non-tender, without nodularity LYMPH:  no palpable lymphadenopathy in the cervical, axillary or inguinal LUNGS: clear to auscultation and percussion with normal breathing effort HEART: (+) LVAD blood flow   ABDOMEN:abdomen soft, non-tender and normal bowel sounds, (+) percutaneous biliary drainage tube in the right upper quadrant of abdomen Musculoskeletal:no cyanosis of digits and no clubbing  PSYCH: alert & oriented x 3 with fluent speech NEURO: no focal motor/sensory deficits  LABORATORY DATA:  I have reviewed the data as listed CBC Latest Ref Rng & Units 08/01/2016 07/18/2016 07/04/2016  WBC 4.0 - 10.3 10e3/uL 6.5 6.3 4.8  Hemoglobin 13.0 - 17.1 g/dL 10.5(L) 9.0(L) 10.2(L)  Hematocrit 38.4 - 49.9 % 34.3(L) 30.5(L) 34.8(L)  Platelets 140 - 400 10e3/uL 136(L) 155 105(L)    CMP Latest Ref Rng & Units 08/01/2016 06/19/2016 06/04/2016  Glucose 70 - 140 mg/dl 152(H) 101(H) 121  BUN 7.0 - 26.0 mg/dL 18.2 17 12.0  Creatinine 0.7 - 1.3 mg/dL 0.8 0.92 0.8  Sodium 136 - 145 mEq/L 140 140 143  Potassium 3.5 - 5.1 mEq/L 4.2 4.6 4.6  Chloride 101 - 111 mmol/L - 108 -  CO2 22 - 29 mEq/L 21(L) 28 24  Calcium 8.4 - 10.4 mg/dL 9.6 9.8 9.3  Total Protein 6.4 - 8.3 g/dL 6.6 - 6.6  Total Bilirubin 0.20 - 1.20 mg/dL 0.47 - 1.19  Alkaline Phos 40 - 150 U/L 92 - 90  AST 5 - 34 U/L 43(H) - 34  ALT 0 - 55 U/L 17 - 17   Results for [name], [name] (MRN [id number]) as of 08/01/2016 08:28  Ref. Range 06/21/2016 10:24 07/04/2016 11:46 07/18/2016 10:51  Iron Latest Ref Range: 42 - 163 ug/dL 52  32 (L)  UIBC Latest Ref Range: 117 - 376 ug/dL 230  299  TIBC Latest Ref Range: 202 - 409 ug/dL 283  331  %SAT  Latest Ref Range: 20 - 55 % 19 (L)  10 (L)  Ferritin Latest Ref Range: 22 - 316 ng/ml  183     RADIOGRAPHIC STUDIES: I have personally reviewed the radiological images as listed and agreed with the findings in the report. Ir Sinus/fist  Tube Chk-non Gi  Result Date: 07/23/2016 CLINICAL DATA:  Gallstones with biliary obstruction EXAM: CHOLECYSTOSTOMY CATHETER INJECTION UNDER FLUOROSCOPY COMPARISON:  ERCP 06/13/2016 and previous studies TECHNIQUE: The procedure, risks (including but not limited to bleeding, infection, organ damage ), benefits, and alternatives were explained to the patient. Questions regarding the procedure were encouraged and answered. The patient understands and consents to the procedure. Survey fluoroscopic inspection reveals stable position of the pigtail cholecystostomy catheter. Injection demonstrates decompressed gallbladder. Small mural based deaf filling defects near the drain catheter possibly calculi versus polyps, tumefactive sludge, or clot. Patency of cystic and common bile ducts. Contrast flows into the duodenum. There is no reflux into the intrahepatic bile ducts. IMPRESSION: 1. Patency of cystic and common bile ducts. The cholecystostomy tube was capped to allow internal drainage. The patient was given an extra drain bag to use in case of worsening right upper quadrant symptoms, pain, fever, or leakage around the skin entry site of the catheter. Electronically Signed   By: Lucrezia Europe M.D.   On: 07/23/2016 08:36    ASSESSMENT & PLAN:  76 year old Caucasian male, with extensive cardiac history, including CAD, ischemic cardiomyopathy status post LVAD, on long-term Coumadin (now on Eliquis), presented with multiple episodes of GI bleeding and anemia.  1. 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 -His iron studies showed low ferritin level and serum iron, consistent with iron deficient anemia. This is secondary to his GI bleeding. -He responded to IV iron very well. 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  -His Coumadin has been  changed to Eliquis  -Continue Feraheme if ferritin less than 100 or low iron level,  I'll set up infusion appointment if needed -He will also receive red blood cell if his hemoglobin less than 9  -Continue IM Sandostatin for his GI bleeding from AVM, recommended by his cardiologist Dr. Haroldine Laws. He has not had major GI bleeding since he started Sandostatin injection. -Continue labs every 2 weeks  2. Mild thrombocytopenia -Secondary to LVAD -Plt count has been stable   3. Nose bleeding -possible related to his meds (Eliquis, plavix and ASA) -If he has recurrent bleeding episode, I will consider topical amicar, may not be safe to use oral amicar due to his LVAD   4. chronic cholecystitis -Status post percutaneous bile drainage, and biliary sphincterotomy and stone removal by Dr. Ardis Hughs on 06/13/2016 -He was seen by see Drs. Gwenyth Allegra, surgery was not offered.   5. He will continue to follow-up with his primary care physician and cardiologist for his HTN, heart disease, hypothyroidism, OSA and mild liver cirrhosis..  Plan -Repeat CBC and ferritin every 2 weeks and iron study monthly at [facility]  -IV Feraheme  510mg  today and as needed if ferritin <100 or low serum iron. I asked the patient to call me the day after his lab test, to see if he needs IV Feraheme. His wife uses my chart. -I will set up iv feraheme in 6 and 12 weeks  -2u RBC if Hb<9.0  -continuet Sandostatin injection  every 4 weeks -I'll see him back in 12 weeks  All questions were answered. The patient knows to call the clinic with any problems, questions or concerns.  I spent 20 minutes counseling the patient face to face. The total time spent in the appointment was 25 minutes and more than 50% was on counseling.     Truitt Merle, MD 08/01/2016

## 2016-08-01 NOTE — Telephone Encounter (Signed)
Appointments scheduled per 11/2 LOS. Patient given AVS report and calendars with future scheduled appointments °

## 2016-08-01 NOTE — Patient Instructions (Signed)
Ferumoxytol injection (Feraheme) What is this medicine? FERUMOXYTOL is an iron complex. Iron is used to make healthy red blood cells, which carry oxygen and nutrients throughout the body. This medicine is used to treat iron deficiency anemia in people with chronic kidney disease. This medicine may be used for other purposes; ask your health care provider or pharmacist if you have questions. What should I tell my health care provider before I take this medicine? They need to know if you have any of these conditions: -anemia not caused by low iron levels -high levels of iron in the blood -magnetic resonance imaging (MRI) test scheduled -an unusual or allergic reaction to iron, other medicines, foods, dyes, or preservatives -pregnant or trying to get pregnant -breast-feeding How should I use this medicine? This medicine is for injection into a vein. It is given by a health care professional in a hospital or clinic setting. Talk to your pediatrician regarding the use of this medicine in children. Special care may be needed. Overdosage: If you think you have taken too much of this medicine contact a poison control center or emergency room at once. NOTE: This medicine is only for you. Do not share this medicine with others. What if I miss a dose? It is important not to miss your dose. Call your doctor or health care professional if you are unable to keep an appointment. What may interact with this medicine? This medicine may interact with the following medications: -other iron products This list may not describe all possible interactions. Give your health care provider a list of all the medicines, herbs, non-prescription drugs, or dietary supplements you use. Also tell them if you smoke, drink alcohol, or use illegal drugs. Some items may interact with your medicine. What should I watch for while using this medicine? Visit your doctor or healthcare professional regularly. Tell your doctor or  healthcare professional if your symptoms do not start to get better or if they get worse. You may need blood work done while you are taking this medicine. You may need to follow a special diet. Talk to your doctor. Foods that contain iron include: whole grains/cereals, dried fruits, beans, or peas, leafy green vegetables, and organ meats (liver, kidney). What side effects may I notice from receiving this medicine? Side effects that you should report to your doctor or health care professional as soon as possible: -allergic reactions like skin rash, itching or hives, swelling of the face, lips, or tongue -breathing problems -changes in blood pressure -feeling faint or lightheaded, falls -fever or chills -flushing, sweating, or hot feelings -swelling of the ankles or feet Side effects that usually do not require medical attention (Report these to your doctor or health care professional if they continue or are bothersome.): -diarrhea -headache -nausea, vomiting -stomach pain This list may not describe all possible side effects. Call your doctor for medical advice about side effects. You may report side effects to FDA at 1-800-FDA-1088. Where should I keep my medicine? This drug is given in a hospital or clinic and will not be stored at home. NOTE: This sheet is a summary. It may not cover all possible information. If you have questions about this medicine, talk to your doctor, pharmacist, or health care provider.    2016, Elsevier/Gold Standard. (2012-05-01 15:23:36)   Octreotide injection solution (Sandostatin) What is this medicine? OCTREOTIDE (ok TREE oh tide) is used to reduce blood levels of growth hormone in patients with a condition called acromegaly. This medicine also reduces flushing  and watery diarrhea caused by certain types of cancer. This medicine may be used for other purposes; ask your health care provider or pharmacist if you have questions. What should I tell my health care  provider before I take this medicine? They need to know if you have any of these conditions: -gallbladder disease -kidney disease -liver disease -an unusual or allergic reaction to octreotide, other medicines, foods, dyes, or preservatives -pregnant or trying to get pregnant -breast-feeding How should I use this medicine? This medicine is for injection under the skin or into a vein (only in emergency situations). It is usually given by a health care professional in a hospital or clinic setting. If you get this medicine at home, you will be taught how to prepare and give this medicine. Allow the injection solution to come to room temperature before use. Do not warm it artificially. Use exactly as directed. Take your medicine at regular intervals. Do not take your medicine more often than directed. It is important that you put your used needles and syringes in a special sharps container. Do not put them in a trash can. If you do not have a sharps container, call your pharmacist or healthcare provider to get one. Talk to your pediatrician regarding the use of this medicine in children. Special care may be needed. Overdosage: If you think you have taken too much of this medicine contact a poison control center or emergency room at once. NOTE: This medicine is only for you. Do not share this medicine with others. What if I miss a dose? If you miss a dose, take it as soon as you can. If it is almost time for your next dose, take only that dose. Do not take double or extra doses. What may interact with this medicine? Do not take this medicine with any of the following medications: -cisapride -droperidol -general anesthetics -grepafloxacin -perphenazine -thioridazine This medicine may also interact with the following medications: -bromocriptine -cyclosporine -diuretics -medicines for blood pressure, heart disease, irregular heart beat -medicines for diabetes, including insulin -quinidine This  list may not describe all possible interactions. Give your health care provider a list of all the medicines, herbs, non-prescription drugs, or dietary supplements you use. Also tell them if you smoke, drink alcohol, or use illegal drugs. Some items may interact with your medicine. What should I watch for while using this medicine? Visit your doctor or health care professional for regular checks on your progress. To help reduce irritation at the injection site, use a different site for each injection and make sure the solution is at room temperature before use. This medicine may cause increases or decreases in blood sugar. Signs of high blood sugar include frequent urination, unusual thirst, flushed or dry skin, difficulty breathing, drowsiness, stomach ache, nausea, vomiting or dry mouth. Signs of low blood sugar include chills, cool, pale skin or cold sweats, drowsiness, extreme hunger, fast heartbeat, headache, nausea, nervousness or anxiety, shakiness, trembling, unsteadiness, tiredness, or weakness. Contact your doctor or health care professional right away if you experience any of these symptoms. What side effects may I notice from receiving this medicine? Side effects that you should report to your doctor or health care professional as soon as possible: -allergic reactions like skin rash, itching or hives, swelling of the face, lips, or tongue -changes in blood sugar -changes in heart rate -severe stomach pain Side effects that usually do not require medical attention (report to your doctor or health care professional if they continue or  are bothersome): -diarrhea or constipation -gas or stomach pain -nausea, vomiting -pain, redness, swelling and irritation at site where injected This list may not describe all possible side effects. Call your doctor for medical advice about side effects. You may report side effects to FDA at 1-800-FDA-1088. Where should I keep my medicine? Keep out of the  reach of children. Store in a refrigerator between 2 and 8 degrees C (36 and 46 degrees F). Protect from light. Allow to come to room temperature naturally. Do not use artificial heat. If protected from light, the injection may be stored at room temperature between 20 and 30 degrees C (70 and 86 degrees F) for 14 days. After the initial use, throw away any unused portion of a multiple dose vial after 14 days. Throw away unused portions of the ampules after use. NOTE: This sheet is a summary. It may not cover all possible information. If you have questions about this medicine, talk to your doctor, pharmacist, or health care provider.    2016, Elsevier/Gold Standard. (2008-04-12 16:56:04)

## 2016-08-02 ENCOUNTER — Telehealth: Payer: Self-pay | Admitting: *Deleted

## 2016-08-02 NOTE — Telephone Encounter (Signed)
Per LOS I have scheduled apapts and notified the scheruler

## 2016-08-14 ENCOUNTER — Other Ambulatory Visit (HOSPITAL_BASED_OUTPATIENT_CLINIC_OR_DEPARTMENT_OTHER): Payer: No Typology Code available for payment source

## 2016-08-14 DIAGNOSIS — D509 Iron deficiency anemia, unspecified: Secondary | ICD-10-CM | POA: Diagnosis not present

## 2016-08-14 LAB — CBC & DIFF AND RETIC
BASO%: 0.3 % (ref 0.0–2.0)
BASOS ABS: 0 10*3/uL (ref 0.0–0.1)
EOS%: 5 % (ref 0.0–7.0)
Eosinophils Absolute: 0.3 10*3/uL (ref 0.0–0.5)
HEMATOCRIT: 37.5 % — AB (ref 38.4–49.9)
HEMOGLOBIN: 11.7 g/dL — AB (ref 13.0–17.1)
IMMATURE RETIC FRACT: 11.3 % — AB (ref 3.00–10.60)
LYMPH#: 0.8 10*3/uL — AB (ref 0.9–3.3)
LYMPH%: 12.4 % — AB (ref 14.0–49.0)
MCH: 31.1 pg (ref 27.2–33.4)
MCHC: 31.2 g/dL — ABNORMAL LOW (ref 32.0–36.0)
MCV: 99.7 fL — ABNORMAL HIGH (ref 79.3–98.0)
MONO#: 0.5 10*3/uL (ref 0.1–0.9)
MONO%: 7.2 % (ref 0.0–14.0)
NEUT#: 5 10*3/uL (ref 1.5–6.5)
NEUT%: 75.1 % — AB (ref 39.0–75.0)
PLATELETS: 115 10*3/uL — AB (ref 140–400)
RBC: 3.76 10*6/uL — ABNORMAL LOW (ref 4.20–5.82)
RDW: 18.3 % — ABNORMAL HIGH (ref 11.0–14.6)
RETIC CT ABS: 117.69 10*3/uL — AB (ref 34.80–93.90)
Retic %: 3.13 % — ABNORMAL HIGH (ref 0.80–1.80)
WBC: 6.6 10*3/uL (ref 4.0–10.3)

## 2016-08-15 ENCOUNTER — Encounter (HOSPITAL_COMMUNITY): Payer: Self-pay | Admitting: Infectious Diseases

## 2016-08-15 ENCOUNTER — Telehealth (HOSPITAL_COMMUNITY): Payer: Self-pay | Admitting: Infectious Diseases

## 2016-08-15 NOTE — Telephone Encounter (Signed)
Signed octreotide order faxed back from [place] PCP. D/W Doroteo Bradford Pharm-D - have faxed a 'Request for Additional Services' to the number listed on referral form. Doroteo Bradford has attempted to contact the [facility] several times at the 3 numbers provided by their outpatient pharmacy to discuss PA process and urgency as he is due for injection soon however no answer back. Will continue to try. Patient in [facility] next week.

## 2016-08-20 ENCOUNTER — Ambulatory Visit (HOSPITAL_COMMUNITY)
Admission: RE | Admit: 2016-08-20 | Discharge: 2016-08-20 | Disposition: A | Discharge status: Home / Self Care | Payer: No Typology Code available for payment source | Source: Ambulatory Visit | Attending: Cardiology | Admitting: Cardiology

## 2016-08-20 ENCOUNTER — Encounter (HOSPITAL_COMMUNITY): Payer: Self-pay

## 2016-08-20 VITALS — BP 123/91 | HR 76 | Resp 18 | Wt 221.4 lb

## 2016-08-20 DIAGNOSIS — Z9981 Dependence on supplemental oxygen: Secondary | ICD-10-CM | POA: Diagnosis not present

## 2016-08-20 DIAGNOSIS — Z87891 Personal history of nicotine dependence: Secondary | ICD-10-CM | POA: Insufficient documentation

## 2016-08-20 DIAGNOSIS — K8010 Calculus of gallbladder with chronic cholecystitis without obstruction: Secondary | ICD-10-CM

## 2016-08-20 DIAGNOSIS — W19XXXA Unspecified fall, initial encounter: Secondary | ICD-10-CM | POA: Diagnosis not present

## 2016-08-20 DIAGNOSIS — I2510 Atherosclerotic heart disease of native coronary artery without angina pectoris: Secondary | ICD-10-CM | POA: Insufficient documentation

## 2016-08-20 DIAGNOSIS — F329 Major depressive disorder, single episode, unspecified: Secondary | ICD-10-CM | POA: Insufficient documentation

## 2016-08-20 DIAGNOSIS — G4733 Obstructive sleep apnea (adult) (pediatric): Secondary | ICD-10-CM | POA: Insufficient documentation

## 2016-08-20 DIAGNOSIS — R0902 Hypoxemia: Secondary | ICD-10-CM | POA: Insufficient documentation

## 2016-08-20 DIAGNOSIS — Z951 Presence of aortocoronary bypass graft: Secondary | ICD-10-CM | POA: Diagnosis not present

## 2016-08-20 DIAGNOSIS — R531 Weakness: Secondary | ICD-10-CM | POA: Diagnosis not present

## 2016-08-20 DIAGNOSIS — D649 Anemia, unspecified: Secondary | ICD-10-CM | POA: Diagnosis not present

## 2016-08-20 DIAGNOSIS — J449 Chronic obstructive pulmonary disease, unspecified: Secondary | ICD-10-CM | POA: Insufficient documentation

## 2016-08-20 DIAGNOSIS — I480 Paroxysmal atrial fibrillation: Secondary | ICD-10-CM

## 2016-08-20 DIAGNOSIS — K811 Chronic cholecystitis: Secondary | ICD-10-CM | POA: Diagnosis not present

## 2016-08-20 DIAGNOSIS — E785 Hyperlipidemia, unspecified: Secondary | ICD-10-CM | POA: Diagnosis not present

## 2016-08-20 DIAGNOSIS — E039 Hypothyroidism, unspecified: Secondary | ICD-10-CM | POA: Insufficient documentation

## 2016-08-20 DIAGNOSIS — Z7901 Long term (current) use of anticoagulants: Secondary | ICD-10-CM | POA: Insufficient documentation

## 2016-08-20 DIAGNOSIS — R29898 Other symptoms and signs involving the musculoskeletal system: Secondary | ICD-10-CM | POA: Diagnosis not present

## 2016-08-20 DIAGNOSIS — Z95811 Presence of heart assist device: Secondary | ICD-10-CM | POA: Diagnosis not present

## 2016-08-20 DIAGNOSIS — Z79899 Other long term (current) drug therapy: Secondary | ICD-10-CM | POA: Diagnosis not present

## 2016-08-20 DIAGNOSIS — H269 Unspecified cataract: Secondary | ICD-10-CM | POA: Insufficient documentation

## 2016-08-20 DIAGNOSIS — I110 Hypertensive heart disease with heart failure: Secondary | ICD-10-CM | POA: Insufficient documentation

## 2016-08-20 DIAGNOSIS — E669 Obesity, unspecified: Secondary | ICD-10-CM | POA: Diagnosis not present

## 2016-08-20 DIAGNOSIS — I5022 Chronic systolic (congestive) heart failure: Secondary | ICD-10-CM | POA: Diagnosis present

## 2016-08-20 LAB — COMPREHENSIVE METABOLIC PANEL
ALBUMIN: 3.8 g/dL (ref 3.5–5.0)
ALK PHOS: 78 U/L (ref 38–126)
ALT: 32 U/L (ref 17–63)
AST: 66 U/L — AB (ref 15–41)
Anion gap: 7 (ref 5–15)
BILIRUBIN TOTAL: 0.6 mg/dL (ref 0.3–1.2)
BUN: 17 mg/dL (ref 6–20)
CALCIUM: 9.9 mg/dL (ref 8.9–10.3)
CO2: 24 mmol/L (ref 22–32)
Chloride: 108 mmol/L (ref 101–111)
Creatinine, Ser: 0.91 mg/dL (ref 0.61–1.24)
GFR calc Af Amer: >60 mL/min (ref >60)
GFR calc non Af Amer: >60 mL/min (ref >60)
GLUCOSE: 113 mg/dL — AB (ref 65–99)
Potassium: 4.1 mmol/L (ref 3.5–5.1)
Sodium: 139 mmol/L (ref 135–145)
TOTAL PROTEIN: 6.9 g/dL (ref 6.5–8.1)

## 2016-08-20 LAB — CBC
HEMATOCRIT: 39.5 % (ref 39.0–52.0)
HEMOGLOBIN: 12.4 g/dL — AB (ref 13.0–17.0)
MCH: 31.4 pg (ref 26.0–34.0)
MCHC: 31.4 g/dL (ref 30.0–36.0)
MCV: 100 fL (ref 78.0–100.0)
Platelets: 112 10*3/uL — ABNORMAL LOW (ref 150–400)
RBC: 3.95 MIL/uL — AB (ref 4.22–5.81)
RDW: 18 % — ABNORMAL HIGH (ref 11.5–15.5)
WBC: 6.8 10*3/uL (ref 4.0–10.5)

## 2016-08-20 LAB — LACTATE DEHYDROGENASE: LDH: 287 U/L — ABNORMAL HIGH (ref 98–192)

## 2016-08-20 NOTE — Progress Notes (Signed)
Patient ID: MODESTO ZABEL, male   DOB: 1940/10/15, 75 y.o.   MRN: [id number]   VAD CLINIC NOTE   [facility] [place] (Dr. 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 Quit smoking 2003. Uses CPAP and O2 every night. He is s/p LVAD HM II implanted 01/12/13 under DT criteria. In 2016 ICD extraction due to pocket infection.   GI Events   02/2013- Had EGD/Colonoscopy which was unrevealing.  6 mm polyp clipped and removed. 06/2013- Push enteroscopy was negative. Colonoscopy without source of bleeding. Capsule endoscopy--> Showed bleeding from duodenum. EGD--->Bleeding from duodenum with clip applied. 08/2014- Enteroscopy showed bleed from jejunum requiring 3 clips.  11/2015-  Enteroscopy was normal. 11/2015- Capsule 12/11/15 2 proximal small bowel AVMs, 2 nonbleeding polyps which were removed. Coumadin was switched to apixaban 01/2016 - EGD with AVM clipping 01/2016- Acute cholecystitis with cholecystostomy tube placed.  06/2016- Cholecystotomy tube removed   LVAD Clinic f/u:  Returns for follow up with his wife. Overall feeling well. Mild DOE which is chronic. No orthopnea or PND. Recent monitor showed about 60% AF which was rate controlled.  ERCP performed 06/13/16 with Ardis Hughs >> 2 stones removed and underwent sphincterotomy. Saw Dr. Donne Hazel - cholecystotomy tube not draining any longer so was removed. Denies ab pain. No edema, orthopnea or PND. No bleeding on Eliquis. Wants to get cataracts out. Takes lasix 1 - 3 times a week for edema. C/o weakness in LLE.    FU with Dr. Burr Medico for continuous Iron infusions. Next scheduled Friday. Next Octreotide 07/04/16  He is currently on Eliquis 5 mg BID. Denies melena or bloody stools.   LVAD interrogation reveals:   Speed: 9000 Flow:  5.5 Power: 5.7 w PI: 4.5 - 5.7 Alarms: none Events: Rare    (multiple PI events on 11/3 otherwise minimal) Fixed speed:  9000 Low speed limit: 8400 Primary Controller:  Replace back up battery in 25 months  Back up controller:   Replace back up battery in 26 months    Labs (2/16): LDL 305, HCT 38.9 Labs (3/16): K 3.9, creatinine 1.05, LFTs normal Labs (2/17): WBCs 11.2, INR 2.68.  Labs (6/17): K 3.3, creatinine 0.94, HCT 35.1, INR 2.3   Past Medical History:  Diagnosis Date  . Anemia   . Asbestosis(501)    "6 years in the [facility]" (05/24/2013)  . Atrial fibrillation ([facility])    permanent  . Cancer ([facility])    "scraped some off behind my left ear; fast moving; having it cut out 12/05/2015" (11/07/2015)  . CHF (congestive heart failure) ([facility] [place])   . Chronic anticoagulation    Afib and LVAD  . Chronic systolic heart failure ([facility])   . 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 Complication of anesthesia    "they can't put me all the way to sleep cause of my heart" (11/07/2015)  . COPD (chronic obstructive pulmonary disease) ([facility])   . Coronary artery disease   . Depression   . Epistaxis 11/2015, 07/2014  . History of blood transfusion 08/2015 X 10; 11/2015 X 2   "related to bleeding on the inside somewhere" (11/07/2015)  . Hyperlipidemia   . Hypertension   . Hypothyroidism   . Ischemic cardiomyopathy     CABG 2003, PCI  2007  EF 27%(myoview 2012  . LVAD (left ventricular assist device) present ([facility]) 12/2012  . Myocardial infarction 1990's-2000   "2 in  ~ the 1990's; 1 in ~ 2000" (11/07/2015)  . Obesity   . On home oxygen therapy    "have it available; don't use it" (11/07/2015)  . OSA on CPAP    no longer on cpap  . Paroxysmal ventricular tachycardia ([facility])   . Restless leg syndrome   . Shortness of breath dyspnea     Current Outpatient Prescriptions  Medication Sig Dispense Refill  . albuterol (PROAIR HFA) 108 (90 Base) MCG/ACT inhaler Inhale 2 puffs into  the lungs every 6 (six) hours as needed for wheezing or shortness of breath.    [name] albuterol (PROVENTIL) (2.5 MG/3ML) 0.083% nebulizer solution Take 2.5 mg by nebulization every 6 (six) hours as needed for wheezing or shortness of breath.    [name] amiodarone (PACERONE) 200 MG tablet Take 1 tablet (200 mg total) by mouth every evening. (Patient taking differently: Take 200 mg by mouth at bedtime. ) 90 tablet 3  . apixaban (ELIQUIS) 5 MG TABS tablet Take 1 tablet (5 mg total) by mouth 2 (two) times daily. 60 tablet 11  . budesonide-formoterol (SYMBICORT) 160-4.5 MCG/ACT inhaler Inhale 2 puffs into the lungs 2 (two) times daily. 1 Inhaler 3  . cholecalciferol (VITAMIN D) 1000 UNITS tablet Take 1 tablet (1,000 Units total) by mouth 2 (two) times daily. 180 tablet 3  . citalopram (CELEXA) 40 MG tablet Take 1 tablet (40 mg total) by mouth at bedtime. 90 tablet 3  . docusate sodium (COLACE) 100 MG capsule Take 100 mg by mouth 2 (two) times daily.     . furosemide (LASIX) 40 MG tablet Use only if a weight gain of 5 pounds in a week occurs or evident swelling is present (Patient taking differently: Take 40 mg by mouth daily as needed. Use only if a weight gain of 5 pounds in a week occurs or evident swelling is present) 30 tablet 4  . hydrocortisone (ANUSOL-HC) 2.5 % rectal cream Place 1 application rectally 2 (two) times daily. (Patient taking differently: Place 1 application rectally daily as needed for hemorrhoids or itching. As needed) 30 g 6  . levothyroxine (SYNTHROID, LEVOTHROID) 100 MCG tablet Take 1 tablet (100 mcg total) by mouth at bedtime. 30 tablet 6  . losartan (COZAAR) 25 MG tablet Take 25 mg by mouth at bedtime.    . Multiple Vitamin (MULTIVITAMIN WITH MINERALS) TABS tablet Take 1 tablet by mouth daily.    . nitroGLYCERIN (NITROSTAT) 0.4 MG SL tablet Place 0.4 mg under the tongue every 5 (five) minutes as needed for chest pain. Reported on 04/09/2016    . [START ON 08/29/2016] octreotide (SANDOSTATIN  LAR) 20 MG injection Inject 20 mg into the muscle every 28 (twenty-eight) days. 1 each 5  . pantoprazole (PROTONIX) 40 MG tablet Take 1 tablet (40 mg total) by mouth 2 (two) times daily. 180 tablet 3  . potassium chloride SA (K-DUR,KLOR-CON) 20 MEQ tablet Take 2 tablets (40 mEq total) by mouth daily. And take extra 1 pill with lasix when needed. 80 tablet 4  . PRESCRIPTION MEDICATION Ferumoxytol (FERAHEME) 510 mg in sodium chloride 0.9 % 100 mL IVPB (Once) Route: Intravenous as needed (checked every 2 weeks)    . PRESCRIPTION MEDICATION Octreotide (SANDOSTATIN LAR) IM injection 20 mg (Once)Route: Intramuscular every 28 days    . simvastatin (ZOCOR) 80 MG tablet Take 0.5 tablets (40 mg  total) by mouth at bedtime. 45 tablet 3  . tiotropium (SPIRIVA) 18 MCG inhalation capsule Place 1 capsule (18 mcg total) into inhaler and inhale daily. 90 capsule 3  . vitamin B-12 (CYANOCOBALAMIN) 1000 MCG tablet Take 1,000 mcg by mouth at bedtime.    . vitamin C (ASCORBIC ACID) 500 MG tablet Take 1 tablet (500 mg total) by mouth 2 (two) times daily. 180 tablet 3   No current facility-administered medications for this encounter.     Tape  REVIEW OF SYSTEMS: All systems negative except as listed in HPI, PMH and Problem list.  Vitals:   08/20/16 1104  BP: (!) 123/91  Pulse: 76  Resp: 18  SpO2: 99%  Weight: 221 lb 6 oz (100.4 kg)    GENERAL:Well appearing, male; NAD; wife present. Ambulated in the clinic without difficulty.  HEENT: normal  NECK: Supple, JVP flat  no bruits.  No lymphadenopathy or thyromegaly appreciated.   CARDIAC: Mechanical heart sounds with LVAD hum present.  LUNGS:  Clear  ABDOMEN:  Obese, Soft, round, nontender, positive bowel sounds x4.     LVAD exit site: well-healed and incorporated.  Dressing dry and intact.  No erythema, drainage, odor or tenderness.  Stabilization device present and accurately applied.  Driveline dressing is being changed daily per sterile technique. Changed in  clinic EXTREMITIES:  Warm and dry, no cyanosis, clubbing, no edema NEUROLOGIC:  Alert and oriented x 4.  Gait favors left leg.  Weak plantar flexion. No aphasia.  No dysarthria.  Affect pleasant.    ASSESSMENT AND PLAN:  1. Chronic Systolic HF: s/p LVAD implant 12/2012 for DT.  NYHA II-III.  - Volume status stable.  Not on BB due to previous bradycardia.  - Continue diuretics as needed.  - Reinforced the need and importance of daily weights, a low sodium diet, and fluid restriction (less than 2 L a day). Instructed to call the HF clinic if weight increases more than 3 lbs overnight or 5 lbs in a week.  2. Anticoagulation management: On apixaban so no INR. Marland KitchenHe is not on ASA because of bleeding. He is now getting monthly IM octreotide. Followed by Hematology. Getting iron infusions 3. Anemia/ GIB:  Now on apixaban due to recurrent GI bleeding with normal endoscopy. Hgb stable today at 12.4 Followed by hematology for  feraheme and octreotide.    4. LVAD: HM II.       - VAD parameters stable. Rare PI events except for 11/3  5. [id number] 30 day event monitor on 04/23/16. In A fib 67% of the time. Goes in and out of A fib. For now continue amio 200 mg daily for rate control. May need to stop amio if TSH of LFTs abnormal. Should have yearly eye exam.    6. Depression: Stable. Continue Celexa 40 mg daily 7. OSA: Using CPAP.  8. Hypoxemia:  Uses home oxygen at night.  This may be due to his baseline COPD. 9. ICD infection - s/p extraction 03/2015.  10. Chronic cholecystitis:  ERCP with removal of 2 stones and sphincterotomy.  C-tube pulled by Dr. Donne Hazel and doing well. If recurrent symptoms will need cholecystectomy  11. LLE weakness: suspect herniated disk. Will refer to Dr. Ninfa Linden in ortho 12. Cataracts:  Ok to proceed with extraction. Would hold Eliquis for 2 doses prior   Glori Bickers MD 08/20/2016

## 2016-08-20 NOTE — Patient Instructions (Signed)
Will refer to orthopedics for evaluation of left leg weakness at [facility] with Dr. Zollie Beckers. Address: [address], [place], [place] [address]  Phone: [phone]  Follow up 2 months.  Happy Holidays!

## 2016-08-27 ENCOUNTER — Telehealth (HOSPITAL_COMMUNITY): Payer: Self-pay | Admitting: Pharmacist

## 2016-08-27 NOTE — Telephone Encounter (Signed)
Spoke with Mrs. [name] and explained that I have been trying to get in contact with someone at the [place] about the octreotide request. The person that I spoke with last week [name]) has not gotten back to me with an update and I have tried calling since yesterday with no success and she does not have a VM set up. I will try again early tomorrow morning and if we still do not have a response, we will have to reschedule his appointment. Mrs. [name] was agreeable and grateful for the assistance.   [name]. [name], PharmD, BCPS, CPP Clinical Pharmacist Pager: [phone] Phone: [phone] 08/27/2016 4:26 PM

## 2016-08-28 ENCOUNTER — Telehealth (HOSPITAL_COMMUNITY): Payer: Self-pay | Admitting: Pharmacist

## 2016-08-28 NOTE — Telephone Encounter (Addendum)
Was able to get in contact with [facility] billing specialist, Lelan Pons, this morning who stated she would call back with an update on [name]'s octreotide status. I have not heard back and again could not leave a VM for her. Have updated Mrs. Ottmar and advised her to reschedule his octreotide injection for next week.   ADDENDUM:  Patient approved x 6 months as of 08/29/16 and will get octreotide injection today as scheduled.   Ruta Hinds. Velva Harman, PharmD, BCPS, CPP Clinical Pharmacist Pager: [phone] Phone: [phone] 08/28/2016 4:33 PM

## 2016-08-29 ENCOUNTER — Ambulatory Visit (HOSPITAL_BASED_OUTPATIENT_CLINIC_OR_DEPARTMENT_OTHER): Payer: No Typology Code available for payment source

## 2016-08-29 ENCOUNTER — Other Ambulatory Visit (HOSPITAL_BASED_OUTPATIENT_CLINIC_OR_DEPARTMENT_OTHER): Payer: No Typology Code available for payment source

## 2016-08-29 VITALS — BP 110/51 | HR 87 | Temp 97.8°F | Resp 20

## 2016-08-29 DIAGNOSIS — D500 Iron deficiency anemia secondary to blood loss (chronic): Secondary | ICD-10-CM | POA: Diagnosis not present

## 2016-08-29 DIAGNOSIS — K922 Gastrointestinal hemorrhage, unspecified: Secondary | ICD-10-CM

## 2016-08-29 DIAGNOSIS — D509 Iron deficiency anemia, unspecified: Secondary | ICD-10-CM | POA: Diagnosis not present

## 2016-08-29 DIAGNOSIS — D62 Acute posthemorrhagic anemia: Secondary | ICD-10-CM

## 2016-08-29 LAB — IRON AND TIBC
%SAT: 24 % (ref 20–55)
IRON: 64 ug/dL (ref 42–163)
TIBC: 266 ug/dL (ref 202–409)
UIBC: 202 ug/dL (ref 117–376)

## 2016-08-29 LAB — CBC & DIFF AND RETIC
BASO%: 0.2 % (ref 0.0–2.0)
Basophils Absolute: 0 10*3/uL (ref 0.0–0.1)
EOS ABS: 0.3 10*3/uL (ref 0.0–0.5)
EOS%: 4.4 % (ref 0.0–7.0)
HCT: 38.2 % — ABNORMAL LOW (ref 38.4–49.9)
HGB: 12 g/dL — ABNORMAL LOW (ref 13.0–17.1)
IMMATURE RETIC FRACT: 17.6 % — AB (ref 3.00–10.60)
LYMPH%: 12.6 % — AB (ref 14.0–49.0)
MCH: 31.4 pg (ref 27.2–33.4)
MCHC: 31.4 g/dL — ABNORMAL LOW (ref 32.0–36.0)
MCV: 100 fL — AB (ref 79.3–98.0)
MONO#: 0.5 10*3/uL (ref 0.1–0.9)
MONO%: 8.2 % (ref 0.0–14.0)
NEUT%: 74.6 % (ref 39.0–75.0)
NEUTROS ABS: 4.8 10*3/uL (ref 1.5–6.5)
Platelets: 100 10*3/uL — ABNORMAL LOW (ref 140–400)
RBC: 3.82 10*6/uL — AB (ref 4.20–5.82)
RDW: 17.6 % — AB (ref 11.0–14.6)
RETIC %: 2.84 % — AB (ref 0.80–1.80)
Retic Ct Abs: 108.49 10*3/uL — ABNORMAL HIGH (ref 34.80–93.90)
WBC: 6.4 10*3/uL (ref 4.0–10.3)
lymph#: 0.8 10*3/uL — ABNORMAL LOW (ref 0.9–3.3)

## 2016-08-29 LAB — FERRITIN: FERRITIN: 132 ng/mL (ref 22–316)

## 2016-08-29 MED ORDER — OCTREOTIDE ACETATE 20 MG IM KIT
20.0000 mg | PACK | Freq: Once | INTRAMUSCULAR | Status: AC
  Administered 2016-08-29: 20 mg via INTRAMUSCULAR
  Filled 2016-08-29: qty 1

## 2016-08-29 NOTE — Patient Instructions (Signed)

## 2016-09-05 ENCOUNTER — Telehealth: Payer: Self-pay | Admitting: *Deleted

## 2016-09-05 NOTE — Telephone Encounter (Signed)
-----   Message from Truitt Merle, MD sent at 08/31/2016  9:17 PM EST ----- Please let pt know that his iron studay from 11/30 was normal, no need iv iron for now.  Truitt Merle  08/31/2016

## 2016-09-05 NOTE — Telephone Encounter (Signed)
Called pt at home and left message on voice mail of Dr. Ernestina Penna instructions about iron study from 11/30 - normal, no need iv iron for now.

## 2016-09-11 ENCOUNTER — Ambulatory Visit (INDEPENDENT_AMBULATORY_CARE_PROVIDER_SITE_OTHER): Payer: Self-pay | Admitting: Orthopaedic Surgery

## 2016-09-12 ENCOUNTER — Ambulatory Visit: Payer: No Typology Code available for payment source

## 2016-09-12 ENCOUNTER — Other Ambulatory Visit (HOSPITAL_BASED_OUTPATIENT_CLINIC_OR_DEPARTMENT_OTHER): Payer: No Typology Code available for payment source

## 2016-09-12 DIAGNOSIS — D509 Iron deficiency anemia, unspecified: Secondary | ICD-10-CM

## 2016-09-12 LAB — CBC & DIFF AND RETIC
BASO%: 0.4 % (ref 0.0–2.0)
BASOS ABS: 0 10*3/uL (ref 0.0–0.1)
EOS%: 4.2 % (ref 0.0–7.0)
Eosinophils Absolute: 0.4 10*3/uL (ref 0.0–0.5)
HEMATOCRIT: 40.2 % (ref 38.4–49.9)
HGB: 13 g/dL (ref 13.0–17.1)
IMMATURE RETIC FRACT: 16.2 % — AB (ref 3.00–10.60)
LYMPH#: 0.8 10*3/uL — AB (ref 0.9–3.3)
LYMPH%: 9 % — AB (ref 14.0–49.0)
MCH: 31.5 pg (ref 27.2–33.4)
MCHC: 32.4 g/dL (ref 32.0–36.0)
MCV: 97.2 fL (ref 79.3–98.0)
MONO#: 0.9 10*3/uL (ref 0.1–0.9)
MONO%: 10.3 % (ref 0.0–14.0)
NEUT#: 6.8 10*3/uL — ABNORMAL HIGH (ref 1.5–6.5)
NEUT%: 76.1 % — AB (ref 39.0–75.0)
PLATELETS: 101 10*3/uL — AB (ref 140–400)
RBC: 4.14 10*6/uL — AB (ref 4.20–5.82)
RDW: 18.4 % — ABNORMAL HIGH (ref 11.0–14.6)
RETIC CT ABS: 136.21 10*3/uL — AB (ref 34.80–93.90)
Retic %: 3.29 % — ABNORMAL HIGH (ref 0.80–1.80)
WBC: 8.9 10*3/uL (ref 4.0–10.3)

## 2016-09-19 ENCOUNTER — Ambulatory Visit (INDEPENDENT_AMBULATORY_CARE_PROVIDER_SITE_OTHER): Payer: Medicare Other | Admitting: Orthopaedic Surgery

## 2016-09-19 ENCOUNTER — Ambulatory Visit (INDEPENDENT_AMBULATORY_CARE_PROVIDER_SITE_OTHER): Payer: Medicare Other

## 2016-09-19 DIAGNOSIS — R2689 Other abnormalities of gait and mobility: Secondary | ICD-10-CM

## 2016-09-19 DIAGNOSIS — M25562 Pain in left knee: Secondary | ICD-10-CM

## 2016-09-19 NOTE — Progress Notes (Signed)
Office Visit Note   Patient: [name]           Date of Birth: Jul 06, 1941           MRN: [id number] Visit Date: 09/19/2016              Requested by: Thressa Sheller, MD [address] [place], [place] [address] PCP: Thressa Sheller, MD   Assessment & Plan: Visit Diagnoses:  1. Acute pain of left knee   2. Balance problem     Plan: At this point I feel that he would benefit from using a cane or walking stick in his right opposite hand. He would definitely visit that from a coordinated physical therapy effort to work on balance and coordination and decrease his risk of falls. That she work on bilateral lower extremity strengthening. Certainly he probably needs to see his primary care physician soon to make sure this is not an issue outside of musculoskeletal.  Follow-Up Instructions: Return in about 4 weeks (around 10/17/2016).   Orders:  Orders Placed This Encounter  Procedures  . XR Knee 1-2 Views Left   No orders of the defined types were placed in this encounter.     Procedures: No procedures performed   Clinical Data: No additional findings.   Subjective: Chief Complaint  Patient presents with  . Left Knee - Pain, Weakness    Patient states for quite sometime he has had some "weakness" in his knee. States when he stands straight he's fine but when he is stepping up or down his knee gives away.   His biggest complaint is feeling as if there is a perceived weakness of his left lower extremity. He denies any injury. He does have a cane and a walking stick at home but he does not use these. He is concerned that he may have a fall. He's concerned about stepping up on his left leg or going up and downstairs. Again he denies any trauma. He denies a nubs and tingling in his legs. He denies any groin pain or knee pain. HPI  Review of Systems Currently he denies any chest pain, headache, shortness of breath, fever, chills, nausea,  vomiting.  Objective: Vital Signs: There were no vitals taken for this visit.  Physical Exam  Ortho Exam He gets up on the exam table and then down from the exam table very easily. He is walking without any type of limp today. His left lower extremity exam is entirely normal. He has excellent range of motion with no pain in the groin or the knee. His knee is cool. There is no effusion of the left knee. It is ligamentous is stable as well. He has 5 out of 5 strength when isolating his quad muscles as well as his hip abductors and hip flexors. He has 5 out of 5 strength below his knee into his foot and his ankle. His sensation is normal as well. I do feel though when he gets up he does have some balance issues. He also has a negative straight leg raise bilaterally. He has good flexion-extension of his lumbar spine with no discomfort. He currently denies any pain. Specialty Comments:  No specialty comments available.  Imaging: Xr Knee 1-2 Views Left  Result Date: 09/19/2016 2 views of his left knee show no acute findings. He has very well maintained joint spaces. His alignment looks good. There is no effusion.    PMFS History: Patient Active Problem List   Diagnosis Date  Noted  . Common bile duct (CBD) obstruction   . Choledocholithiasis   . Chronic cholecystitis with calculus 04/23/2016  . Chronic anticoagulation 04/23/2016  . Septic shock ([place]) 03/03/2016  . Acute calculous cholecystitis 03/03/2016  . Chronic systolic (congestive) heart failure   . Hypotension 02/26/2016  . AVM (arteriovenous malformation) of colon with hemorrhage 02/23/2016  . Atrial flutter ([place]) 12/08/2015  . AVM (arteriovenous malformation) of small bowel, acquired ([place])   . Hemorrhoid   . BRBPR (bright red blood per rectum)   . Symptomatic anemia 11/07/2015  . Epistaxis, recurrent   . PAF (paroxysmal atrial fibrillation) ([place])   . Acute on chronic systolic (congestive) heart failure   . Thrombocytopenia  ([place]) 08/10/2015  . Leg pain, left 07/15/2015  . Left ventricular assist device present ([place])   . Pacemaker complications [id number]  . Implantable cardioverter-defibrillator infection ([place]) 02/28/2015  . Anemia due to blood loss, acute   . GI bleed 09/25/2014  . Atrial fibrillation, unspecified   . Angiodysplasia of duodenum with hemorrhage 08/01/2014  . Chronic atrial fibrillation ([place])   . Intestinal bleeding   . Acute blood loss anemia 07/23/2014  . Anemia 07/23/2014  . Benign neoplasm of colon 03/09/2014  . Internal hemorrhoids 03/09/2014  . Gastric erosions 03/09/2014  . GI bleeding 03/07/2014  . Anemia, iron deficiency 05/26/2013  . TIA (transient ischemic attack) 05/24/2013  . Essential hypertension 04/21/2013  . Reactive depression (situational) 01/22/2013  . Incisional pain 01/14/2013  . LVAD (left ventricular assist device) present ([place]) 01/13/2013  . COPD (chronic obstructive pulmonary disease) ([place]) 01/11/2013  . Abnormal PFTs 01/11/2013  . Pulmonary hypertension 01/11/2013  . Chronic systolic heart failure ([place]) 07/29/2012  . Obstructive sleep apnea 07/02/2012  . Coronary artery disease 04/22/2011  . Ventricular tachycardia ([place]) 02/15/2011  . Ischemic cardiomyopathy   . ICD (implantable cardiac defibrillator) in place   . Atrial fibrillation [facility])    Past Medical History:  Diagnosis Date  . Anemia   . Asbestosis(501)    "6 years in the [place]" (05/24/2013)  . Atrial fibrillation ([place])    permanent  . Cancer ([place])    "scraped some off behind my left ear; fast moving; having it cut out 12/05/2015" (11/07/2015)  . CHF (congestive heart failure) ([place])   . Chronic anticoagulation    Afib and LVAD  . Chronic systolic heart failure ([place])   . 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 Complication of anesthesia    "they can't put me all the way to sleep cause of my heart" (11/07/2015)  . COPD (chronic obstructive pulmonary disease)  ([place])   . Coronary artery disease   . Depression   . Epistaxis 11/2015, 07/2014  . History of blood transfusion 08/2015 X 10; 11/2015 X 2   "related to bleeding on the inside somewhere" (11/07/2015)  . Hyperlipidemia   . Hypertension   . Hypothyroidism   . Ischemic cardiomyopathy     CABG 2003, PCI 2007  EF 27%(myoview 2012  . LVAD (left ventricular assist device) present ([place]) 12/2012  . Myocardial infarction 1990's-2000   "2 in  ~ the 1990's; 1 in ~ 2000" (11/07/2015)  . Obesity   . On home oxygen therapy    "have it available; don't use it" (11/07/2015)  . OSA on CPAP    no longer on cpap  . Paroxysmal ventricular tachycardia ([place])   . Restless leg syndrome   . Shortness  of breath dyspnea     Family History  Problem Relation Age of Onset  . Heart attack Mother   . Heart attack Father     Past Surgical History:  Procedure Laterality Date  . APPENDECTOMY    . CARDIAC DEFIBRILLATOR PLACEMENT  2004; ~ 2010   "cut it out in 2016 after it got infected"   . COLONOSCOPY N/A 03/09/2014   Procedure: COLONOSCOPY;  Surgeon: Inda Castle, MD;  Location: [facility];  Service: Endoscopy;  Laterality: N/A;  LVAD  patient  . COLONOSCOPY N/A 07/29/2014   Procedure: COLONOSCOPY;  Surgeon: Gatha Mayer, MD;  Location: [facility];  Service: Endoscopy;  Laterality: N/A;  . CORONARY ARTERY BYPASS GRAFT  2003   "?X4" (05/24/2013)  . ENTEROSCOPY N/A 07/27/2014   Procedure: ENTEROSCOPY;  Surgeon: Gatha Mayer, MD;  Location: [facility] ENDOSCOPY;  Service: Endoscopy;  Laterality: N/A;  LVAD patient   . ENTEROSCOPY N/A 09/27/2014   Procedure: ENTEROSCOPY;  Surgeon: Gatha Mayer, MD;  Location[facility] ENDOSCOPY;  Service: Endoscopy;  Laterality: N/A;  . ENTEROSCOPY N/A 11/09/2015   Procedure: ENTEROSCOPY;  Surgeon: Mauri Pole, MD;  Location: [facility] ENDOSCOPY;  Service: Endoscopy;  Laterality: N/A;  LVAD  . ENTEROSCOPY N/A 12/13/2015   Procedure: ENTEROSCOPY;  Surgeon: Milus Banister, MD;  Location: [place];  Service: Endoscopy;  Laterality: N/A;  . ENTEROSCOPY N/A 02/21/2016   Procedure: ENTEROSCOPY;  Surgeon: Milus Banister, MD;  Location: [facility];  Service: Endoscopy;  Laterality: N/A;  . ERCP N/A 06/13/2016   Procedure: ENDOSCOPIC RETROGRADE CHOLANGIOPANCREATOGRAPHY (ERCP);  Surgeon: Milus Banister, MD;  Location: [facility];  Service: Endoscopy;  Laterality: N/A;  . ESOPHAGOGASTRODUODENOSCOPY N/A 03/09/2014   Procedure: ESOPHAGOGASTRODUODENOSCOPY (EGD);  Surgeon: Inda Castle, MD;  Location: [facility];  Service: Endoscopy;  Laterality: N/A;  . ESOPHAGOGASTRODUODENOSCOPY N/A 08/01/2014   Procedure: ESOPHAGOGASTRODUODENOSCOPY (EGD);  Surgeon: Jerene Bears, MD;  Location: [facility] ENDOSCOPY;  Service: Endoscopy;  Laterality: N/A;  LVAD patient  . GIVENS CAPSULE STUDY N/A 03/10/2014   Procedure: GIVENS CAPSULE STUDY;  Surgeon: Inda Castle, MD;  Location: [facility];  Service: Endoscopy;  Laterality: N/A;  . GIVENS CAPSULE STUDY N/A 07/30/2014   Procedure: GIVENS CAPSULE STUDY;  Surgeon: Gatha Mayer, MD;  Location: [facility];  Service: Endoscopy;  Laterality: N/A;  . GIVENS CAPSULE STUDY N/A 12/11/2015   Procedure: GIVENS CAPSULE STUDY;  Surgeon: Gatha Mayer, MD;  Location: [facility];  Service: Endoscopy;  Laterality: N/A;  . ICD LEAD REMOVAL Left 03/16/2015   Procedure: ICD LEAD REMOVAL/EXTRACTION;  Surgeon: Evans Lance, MD;  Location: [facility];  Service: Cardiovascular;  Laterality: Left;  PATIENT HAS LVAD  DR. VAN TRIGT TO BACK UP EXTRACTION  . INSERTION OF IMPLANTABLE LEFT VENTRICULAR ASSIST DEVICE N/A 01/12/2013   Procedure: INSERTION OF IMPLANTABLE LEFT VENTRICULAR ASSIST DEVICE;  Surgeon: Ivin Poot, MD;  Location: [facility] [place];  Service: Open Heart Surgery;  Laterality: N/A;   nitric oxide; Redo sternotomy  . INTRAOPERATIVE TRANSESOPHAGEAL ECHOCARDIOGRAM N/A 01/12/2013   Procedure: INTRAOPERATIVE TRANSESOPHAGEAL ECHOCARDIOGRAM;  Surgeon: Ivin Poot, MD;   Location: [facility];  Service: Open Heart Surgery;  Laterality: N/A;  . IR GENERIC HISTORICAL  07/23/2016   IR SINUS/FIST TUBE CHK-NON GI 07/23/2016 Arne Cleveland, MD [facility]-INTERV RAD  . LEFT AND RIGHT HEART CATHETERIZATION WITH CORONARY/GRAFT ANGIOGRAM  01/07/2013   Procedure: LEFT AND RIGHT HEART CATHETERIZATION WITH Beatrix Fetters;  Surgeon: Jolaine Artist, MD;  Location:  [facility] CATH LAB;  Service: Cardiovascular;;   Social History   Occupational History  . Not on file.   Social History Main Topics  . Smoking status: Former Smoker    Packs/day: 2.00    Years: 45.00    Types: Cigarettes    Quit date: 11/11/2001  . Smokeless tobacco: Never Used  . Alcohol use No     Comment: 05/24/2013 "use to drink beer; hardly nothing since 2003; nothing at all since 12/2011 when I got my LVAD  "  . Drug use: No  . Sexual activity: No

## 2016-09-26 ENCOUNTER — Other Ambulatory Visit (HOSPITAL_BASED_OUTPATIENT_CLINIC_OR_DEPARTMENT_OTHER): Payer: No Typology Code available for payment source

## 2016-09-26 ENCOUNTER — Ambulatory Visit (HOSPITAL_BASED_OUTPATIENT_CLINIC_OR_DEPARTMENT_OTHER): Payer: No Typology Code available for payment source

## 2016-09-26 VITALS — BP 100/75 | HR 83 | Temp 97.5°F | Resp 18

## 2016-09-26 DIAGNOSIS — D509 Iron deficiency anemia, unspecified: Secondary | ICD-10-CM

## 2016-09-26 DIAGNOSIS — D500 Iron deficiency anemia secondary to blood loss (chronic): Secondary | ICD-10-CM | POA: Diagnosis not present

## 2016-09-26 DIAGNOSIS — Q2730 Arteriovenous malformation, site unspecified: Secondary | ICD-10-CM

## 2016-09-26 DIAGNOSIS — K922 Gastrointestinal hemorrhage, unspecified: Secondary | ICD-10-CM

## 2016-09-26 DIAGNOSIS — D62 Acute posthemorrhagic anemia: Secondary | ICD-10-CM

## 2016-09-26 LAB — COMPREHENSIVE METABOLIC PANEL
ALBUMIN: 3.7 g/dL (ref 3.5–5.0)
ALK PHOS: 84 U/L (ref 40–150)
ALT: 29 U/L (ref 0–55)
ANION GAP: 9 meq/L (ref 3–11)
AST: 62 U/L — ABNORMAL HIGH (ref 5–34)
BILIRUBIN TOTAL: 0.76 mg/dL (ref 0.20–1.20)
BUN: 21.4 mg/dL (ref 7.0–26.0)
CALCIUM: 10.2 mg/dL (ref 8.4–10.4)
CO2: 24 mEq/L (ref 22–29)
Chloride: 107 mEq/L (ref 98–109)
Creatinine: 1 mg/dL (ref 0.7–1.3)
EGFR: 72 mL/min/{1.73_m2} — AB (ref >90)
Glucose: 118 mg/dl (ref 70–140)
POTASSIUM: 4.7 meq/L (ref 3.5–5.1)
SODIUM: 139 meq/L (ref 136–145)
Total Protein: 7.4 g/dL (ref 6.4–8.3)

## 2016-09-26 LAB — IRON AND TIBC
%SAT: 24 % (ref 20–55)
IRON: 71 ug/dL (ref 42–163)
TIBC: 293 ug/dL (ref 202–409)
UIBC: 221 ug/dL (ref 117–376)

## 2016-09-26 LAB — CBC & DIFF AND RETIC
BASO%: 0.4 % (ref 0.0–2.0)
Basophils Absolute: 0 10*3/uL (ref 0.0–0.1)
EOS ABS: 0.3 10*3/uL (ref 0.0–0.5)
EOS%: 4.6 % (ref 0.0–7.0)
HEMATOCRIT: 39.6 % (ref 38.4–49.9)
HEMOGLOBIN: 12.7 g/dL — AB (ref 13.0–17.1)
Immature Retic Fract: 15.9 % — ABNORMAL HIGH (ref 3.00–10.60)
LYMPH%: 12.3 % — AB (ref 14.0–49.0)
MCH: 32 pg (ref 27.2–33.4)
MCHC: 32.1 g/dL (ref 32.0–36.0)
MCV: 99.7 fL — AB (ref 79.3–98.0)
MONO#: 0.6 10*3/uL (ref 0.1–0.9)
MONO%: 8.7 % (ref 0.0–14.0)
NEUT%: 74 % (ref 39.0–75.0)
NEUTROS ABS: 5.4 10*3/uL (ref 1.5–6.5)
PLATELETS: 105 10*3/uL — AB (ref 140–400)
RBC: 3.97 10*6/uL — ABNORMAL LOW (ref 4.20–5.82)
RDW: 17.3 % — ABNORMAL HIGH (ref 11.0–14.6)
Retic %: 3.37 % — ABNORMAL HIGH (ref 0.80–1.80)
Retic Ct Abs: 133.79 10*3/uL — ABNORMAL HIGH (ref 34.80–93.90)
WBC: 7.3 10*3/uL (ref 4.0–10.3)
lymph#: 0.9 10*3/uL (ref 0.9–3.3)

## 2016-09-26 LAB — FERRITIN: FERRITIN: 107 ng/mL (ref 22–316)

## 2016-09-26 MED ORDER — OCTREOTIDE ACETATE 20 MG IM KIT
20.0000 mg | PACK | Freq: Once | INTRAMUSCULAR | Status: AC
  Administered 2016-09-26: 20 mg via INTRAMUSCULAR
  Filled 2016-09-26: qty 1

## 2016-09-26 NOTE — Patient Instructions (Signed)

## 2016-10-01 ENCOUNTER — Telehealth: Payer: Self-pay | Admitting: *Deleted

## 2016-10-01 NOTE — Telephone Encounter (Signed)
Called pt at home and spoke with wife [name].  Informed wife of lab results, and no need for IV Feraheme for now as per Dr. Burr Medico. [name] voiced understanding, and stated she would relay message to pt.

## 2016-10-01 NOTE — Telephone Encounter (Signed)
-----   Message from Truitt Merle, MD sent at 09/30/2016 12:32 PM EST ----- Please let pt know his lab results, no need iv feraheme for now. Thanks.   Truitt Merle  09/30/2016

## 2016-10-03 ENCOUNTER — Encounter: Payer: Self-pay | Admitting: General Surgery

## 2016-10-10 ENCOUNTER — Other Ambulatory Visit (HOSPITAL_BASED_OUTPATIENT_CLINIC_OR_DEPARTMENT_OTHER): Payer: No Typology Code available for payment source

## 2016-10-10 DIAGNOSIS — D500 Iron deficiency anemia secondary to blood loss (chronic): Secondary | ICD-10-CM

## 2016-10-10 DIAGNOSIS — K922 Gastrointestinal hemorrhage, unspecified: Secondary | ICD-10-CM | POA: Diagnosis not present

## 2016-10-10 DIAGNOSIS — D509 Iron deficiency anemia, unspecified: Secondary | ICD-10-CM

## 2016-10-10 LAB — CBC & DIFF AND RETIC
BASO%: 0.4 % (ref 0.0–2.0)
Basophils Absolute: 0 10*3/uL (ref 0.0–0.1)
EOS%: 4.2 % (ref 0.0–7.0)
Eosinophils Absolute: 0.3 10*3/uL (ref 0.0–0.5)
HCT: 40.1 % (ref 38.4–49.9)
HGB: 12.7 g/dL — ABNORMAL LOW (ref 13.0–17.1)
IMMATURE RETIC FRACT: 18.5 % — AB (ref 3.00–10.60)
LYMPH#: 1 10*3/uL (ref 0.9–3.3)
LYMPH%: 11.8 % — ABNORMAL LOW (ref 14.0–49.0)
MCH: 31.9 pg (ref 27.2–33.4)
MCHC: 31.7 g/dL — ABNORMAL LOW (ref 32.0–36.0)
MCV: 100.8 fL — ABNORMAL HIGH (ref 79.3–98.0)
MONO#: 0.7 10*3/uL (ref 0.1–0.9)
MONO%: 8.5 % (ref 0.0–14.0)
NEUT%: 75.1 % — AB (ref 39.0–75.0)
NEUTROS ABS: 6.1 10*3/uL (ref 1.5–6.5)
PLATELETS: 99 10*3/uL — AB (ref 140–400)
RBC: 3.98 10*6/uL — AB (ref 4.20–5.82)
RDW: 17.1 % — AB (ref 11.0–14.6)
RETIC %: 3.89 % — AB (ref 0.80–1.80)
RETIC CT ABS: 154.82 10*3/uL — AB (ref 34.80–93.90)
WBC: 8.1 10*3/uL (ref 4.0–10.3)

## 2016-10-16 ENCOUNTER — Ambulatory Visit (INDEPENDENT_AMBULATORY_CARE_PROVIDER_SITE_OTHER): Payer: Non-veteran care | Admitting: Orthopaedic Surgery

## 2016-10-22 ENCOUNTER — Ambulatory Visit (HOSPITAL_COMMUNITY)
Admission: RE | Admit: 2016-10-22 | Discharge: 2016-10-22 | Disposition: A | Discharge status: Home / Self Care | Payer: No Typology Code available for payment source | Source: Ambulatory Visit | Attending: Cardiology | Admitting: Cardiology

## 2016-10-22 DIAGNOSIS — R002 Palpitations: Secondary | ICD-10-CM | POA: Diagnosis not present

## 2016-10-22 DIAGNOSIS — Z95811 Presence of heart assist device: Secondary | ICD-10-CM | POA: Insufficient documentation

## 2016-10-22 DIAGNOSIS — I480 Paroxysmal atrial fibrillation: Secondary | ICD-10-CM | POA: Insufficient documentation

## 2016-10-22 DIAGNOSIS — I5022 Chronic systolic (congestive) heart failure: Secondary | ICD-10-CM | POA: Diagnosis not present

## 2016-10-22 DIAGNOSIS — D508 Other iron deficiency anemias: Secondary | ICD-10-CM | POA: Diagnosis not present

## 2016-10-22 DIAGNOSIS — K8010 Calculus of gallbladder with chronic cholecystitis without obstruction: Secondary | ICD-10-CM | POA: Diagnosis not present

## 2016-10-22 DIAGNOSIS — R9431 Abnormal electrocardiogram [ECG] [EKG]: Secondary | ICD-10-CM | POA: Diagnosis not present

## 2016-10-22 LAB — BASIC METABOLIC PANEL
Anion gap: 8 (ref 5–15)
BUN: 18 mg/dL (ref 6–20)
CHLORIDE: 103 mmol/L (ref 101–111)
CO2: 26 mmol/L (ref 22–32)
Calcium: 10.1 mg/dL (ref 8.9–10.3)
Creatinine, Ser: 1.02 mg/dL (ref 0.61–1.24)
GFR calc non Af Amer: >60 mL/min (ref >60)
Glucose, Bld: 121 mg/dL — ABNORMAL HIGH (ref 65–99)
POTASSIUM: 4.6 mmol/L (ref 3.5–5.1)
SODIUM: 137 mmol/L (ref 135–145)

## 2016-10-22 LAB — LACTATE DEHYDROGENASE: LDH: 285 U/L — AB (ref 98–192)

## 2016-10-22 NOTE — Progress Notes (Signed)
Patient presents for 2 month follow up in [facility] today. Reports no problems with VAD equipment or concerns with drive line.  Vital Signs:  Doppler Pressure: 90 Automatc BP: 104/66 (88) HR:  72 SPO2: 99 %  Weight: 227.8 lb w/o eqt Last weight: 221.6 lb Home weights: 220 lbs There is no height or weight on file to calculate BMI.   VAD Indication: Destination Therapy  VAD interrogation & Equipment Management: Speed: 9000 Flow: 5.7 Power: 5.5 w    PI: 5.1  Alarms: no clinical alarms  Events:1 to 3 daily  Fixed speed: 9000 Low speed limit: 8400  Primary Controller:  Replace back up battery in 23 months. Back up controller:   Replace back up battery in 23 months.  Annual Equipment Maintenance on UBC/PM was performed on 12/21/2015.   I reviewed the LVAD parameters from today and compared the results to the patient's prior recorded data. LVAD interrogation was NEGATIVE for significant power changes, NEGATIVE for clinical alarms and STABLE for PI events/speed drops. No programming changes were made and pump is functioning within specified parameters. Pt is performing daily controller and system monitor self tests along with completing weekly and monthly maintenance for LVAD equipment.  Provided patient with 8 weekly dressing kits.  LVAD equipment check completed and is in good working order. Back-up equipment present. Charged back up battery and performed self-test on equipment.   Exit Site Care: Drive line is being maintained weekly by wife. Drive line exit site well healed and incorporated. The velour is fully implanted at exit site. Dressing dry and intact. No erythema or drainage. Stabilization device present and accurately applied. Pt denies fever or chills. Pt states they have adequate dressing supplies at home.   Significant Events on VAD Support:  Significant for GIB (most recent 01/2016)  Device: none   BP & Labs:  MAP 90 - Doppler is reflecting  MAP  Hgb 12.7 - No S/S of bleeding. Specifically denies melena/BRBPR or nosebleeds.  LDH stable at 285 with established baseline of 240- 290. Denies tea-colored urine. No power elevations noted on interrogation.    Balinda Quails RN [place] Coordinator   Office: [phone] 24/7 Emergency VAD Pager: [phone]

## 2016-10-22 NOTE — Progress Notes (Signed)
[facility]  [phone] [phone]  Clinic Follow Up Note   Patient Care Team: Thressa Sheller, MD as PCP - General (Internal Medicine) Jolaine Artist, MD as Attending Physician (Cardiology) Ivin Poot, MD as Attending Physician (Cardiothoracic Surgery) 10/24/2016  CHIEF COMPLAINTS:  Follow up anemia  HISTORY OF PRESENTING ILLNESS (11/01/2014):  Virgina Wood 76 y.o. male is here because of anemia.   He has extensive cardiac history. He had multiple heart attacks in the past 15 years, he had cardiac bypass surgery in 2003, PCI in 2007, long-standing history of , a atrial fibrillationnd was found to have ischemic cardiomyopathy with EF 27% in 2012.  He initially had AICD placed, and finally had LVAD placed in 2014 for his end-stage heart failure. He has been on Coumadin for more than 10 years for the cardiac issues.   He reports he has had anemia for several years, he used to take iron pill. He also received some IV iron a few years ago. In our electronic medical records, his lab showed anemia since 12/2012.   He had 3 episodes of GI bleeding since June of 2015 which are required blood transfusion, last episode was one month ago and he required 4u RBC  at that time. His EGD showed area of bleeding from duodenum all jejunum and he was treated endoscopyly.   He feels well overall. He denies significant chest pain or abdominal discomfort. He has mild dyspnea on moderate exertion. He has mild to moderate fatigue, but is able to function well at home,  does all his ADLs and daily activities.  TREATMENT: 1. IV Feraheme 510 mg as needed, average every 4-8 weeks, blood transfusion as needed if Hb<9 2. 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 returns for follow-up. He has been in a-fib since Friday when he walked to the mailbox in the snow. He says he  feels fine until he starts walking around or moving too much. He saw a cardiologist on Tuesday and has been shocked twice, but he is still in a-fib. The cardiologist won't shock him again. He is on medication. He can walk if he walks very slow and takes a lot of breaks. Denies blood in stool or any other concerns.   MEDICAL HISTORY:  Past Medical History  Diagnosis Date  . Ischemic cardiomyopathy      CABG 2003, PCI 2007  EF 27%(myoview 2012  . Chronic systolic heart failure   . Hyperlipidemia   . Hypothyroidism   . Chronic anticoagulation     Afib and LVAD in 2014   . Obesity   . COPD (chronic obstructive pulmonary disease)   . Asbestosis(501)     "6 years in the [place]" (05/24/2013)  . Atrial fibrillation     permanent  . Paroxysmal ventricular tachycardia   . Coronary artery disease   . Implantable cardioverter-defibrillator [employer]   . Hypertension   . Myocardial infarction 1990's-2000    "2 in  ~ the 1990's; 1 in ~ 2000" (05/24/2013)  . OSA on CPAP   . Depression   . LVAD (left ventricular assist device) present 12/2012    SURGICAL HISTORY: Past Surgical History:  Procedure Laterality Date  . APPENDECTOMY    . CARDIAC DEFIBRILLATOR PLACEMENT  2004; ~ 2010   "cut it out in 2016 after it got infected"   . COLONOSCOPY N/A 03/09/2014   Procedure:  COLONOSCOPY;  Surgeon: Inda Castle, MD;  Location: [facility];  Service: Endoscopy;  Laterality: N/A;  LVAD  patient  . COLONOSCOPY N/A 07/29/2014   Procedure: COLONOSCOPY;  Surgeon: Gatha Mayer, MD;  Location: [facility];  Service: Endoscopy;  Laterality: N/A;  . CORONARY ARTERY BYPASS GRAFT  2003   "?X4" (05/24/2013)  . ENTEROSCOPY N/A 07/27/2014   Procedure: ENTEROSCOPY;  Surgeon: Gatha Mayer, MD;  Location: [facility] ENDOSCOPY;  Service: Endoscopy;  Laterality: N/A;  LVAD patient   . ENTEROSCOPY N/A 09/27/2014   Procedure: ENTEROSCOPY;  Surgeon: Gatha Mayer, MD;  Location: [facility] ENDOSCOPY;  Service: Endoscopy;  Laterality:  N/A;  . ENTEROSCOPY N/A 11/09/2015   Procedure: ENTEROSCOPY;  Surgeon: Mauri Pole, MD;  Location: [place] [facility] ENDOSCOPY;  Service: Endoscopy;  Laterality: N/A;  LVAD  . ENTEROSCOPY N/A 12/13/2015   Procedure: ENTEROSCOPY;  Surgeon: Milus Banister, MD;  Location: [facility];  Service: Endoscopy;  Laterality: N/A;  . ENTEROSCOPY N/A 02/21/2016   Procedure: ENTEROSCOPY;  Surgeon: Milus Banister, MD;  Location: [place];  Service: Endoscopy;  Laterality: N/A;  . ERCP N/A 06/13/2016   Procedure: ENDOSCOPIC RETROGRADE CHOLANGIOPANCREATOGRAPHY (ERCP);  Surgeon: Milus Banister, MD;  Location: [place];  Service: Endoscopy;  Laterality: N/A;  . ESOPHAGOGASTRODUODENOSCOPY N/A 03/09/2014   Procedure: ESOPHAGOGASTRODUODENOSCOPY (EGD);  Surgeon: Inda Castle, MD;  Location: [place] [facility];  Service: Endoscopy;  Laterality: N/A;  . ESOPHAGOGASTRODUODENOSCOPY N/A 08/01/2014   Procedure: ESOPHAGOGASTRODUODENOSCOPY (EGD);  Surgeon: Jerene Bears, MD;  Location[facility] ENDOSCOPY;  Service: Endoscopy;  Laterality: N/A;  LVAD patient  . GIVENS CAPSULE STUDY N/A 03/10/2014   Procedure: GIVENS CAPSULE STUDY;  Surgeon: Inda Castle, MD;  Location: [facility];  Service: Endoscopy;  Laterality: N/A;  . GIVENS CAPSULE STUDY N/A 07/30/2014   Procedure: GIVENS CAPSULE STUDY;  Surgeon: Gatha Mayer, MD;  Location: [place];  Service: Endoscopy;  Laterality: N/A;  . GIVENS CAPSULE STUDY N/A 12/11/2015   Procedure: GIVENS CAPSULE STUDY;  Surgeon: Gatha Mayer, MD;  Location: [place];  Service: Endoscopy;  Laterality: N/A;  . ICD LEAD REMOVAL Left 03/16/2015   Procedure: ICD LEAD REMOVAL/EXTRACTION;  Surgeon: Evans Lance, MD;  Location: [place];  Service: Cardiovascular;  Laterality: Left;  PATIENT HAS LVAD  DR. VAN TRIGT TO BACK UP EXTRACTION  . INSERTION OF IMPLANTABLE LEFT VENTRICULAR ASSIST DEVICE N/A 01/12/2013   Procedure: INSERTION OF IMPLANTABLE LEFT VENTRICULAR ASSIST DEVICE;  Surgeon: Ivin Poot, MD;  Location: [facility];  Service: Open Heart Surgery;  Laterality: N/A;   nitric oxide; Redo sternotomy  . INTRAOPERATIVE TRANSESOPHAGEAL ECHOCARDIOGRAM N/A 01/12/2013   Procedure: INTRAOPERATIVE TRANSESOPHAGEAL ECHOCARDIOGRAM;  Surgeon: Ivin Poot, MD;  Location: [facility];  Service: Open Heart Surgery;  Laterality: N/A;  . IR GENERIC HISTORICAL  07/23/2016   IR SINUS/FIST TUBE CHK-NON GI 07/23/2016 Arne Cleveland, MD [facility]INTERV RAD  . IR GENERIC HISTORICAL  05/14/2016   IR RADIOLOGIST EVAL & MGMT 05/14/2016 [facility] INTERV RAD  . LEFT AND RIGHT HEART CATHETERIZATION WITH CORONARY/GRAFT ANGIOGRAM  01/07/2013   Procedure: LEFT AND RIGHT HEART CATHETERIZATION WITH Beatrix Fetters;  Surgeon: Jolaine Artist, MD;  Location: [facility] CATH LAB;  Service: Cardiovascular;;    SOCIAL HISTORY: Social History   Social History  . Marital status: Married    Spouse name: N/A  . Number of children: N/A  . Years of education: N/A   Occupational History  . Not on file.   Social History  Main Topics  . Smoking status: Former Smoker    Packs/day: 2.00    Years: 45.00    Types: Cigarettes    Quit date: 11/11/2001  . Smokeless tobacco: Never Used  . Alcohol use No     Comment: 05/24/2013 "use to drink beer; hardly nothing since 2003; nothing at all since 12/2011 when I got my LVAD  "  . Drug use: No  . Sexual activity: No   Other Topics Concern  . Not on file   Social History Narrative  . No narrative on file    FAMILY HISTORY: Family History  Problem Relation Age of Onset  . Heart attack Mother   . Heart attack Father     ALLERGIES:  is allergic to tape.  MEDICATIONS:  Current Outpatient Prescriptions  Medication Sig Dispense Refill  . albuterol (PROVENTIL) (2.5 MG/3ML) 0.083% nebulizer solution Take 2.5 mg by nebulization every 6 (six) hours as needed for wheezing or shortness of breath.    [name] amiodarone (PACERONE) 200 MG tablet Take 1 tablet (200 mg total) by mouth every  evening. (Patient taking differently: Take 200 mg by mouth at bedtime. ) 90 tablet 3  . apixaban (ELIQUIS) 5 MG TABS tablet Take 1 tablet (5 mg total) by mouth 2 (two) times daily. 60 tablet 11  . budesonide-formoterol (SYMBICORT) 160-4.5 MCG/ACT inhaler Inhale 2 puffs into the lungs 2 (two) times daily. 1 Inhaler 3  . cholecalciferol (VITAMIN D) 1000 UNITS tablet Take 1 tablet (1,000 Units total) by mouth 2 (two) times daily. 180 tablet 3  . citalopram (CELEXA) 40 MG tablet Take 1 tablet (40 mg total) by mouth at bedtime. 90 tablet 3  . docusate sodium (COLACE) 100 MG capsule Take 100 mg by mouth 2 (two) times daily.     . furosemide (LASIX) 40 MG tablet Use only if a weight gain of 5 pounds in a week occurs or evident swelling is present (Patient taking differently: Take 40 mg by mouth daily as needed. Use only if a weight gain of 5 pounds in a week occurs or evident swelling is present) 30 tablet 4  . hydrocortisone (ANUSOL-HC) 2.5 % rectal cream Place 1 application rectally 2 (two) times daily. (Patient taking differently: Place 1 application rectally daily as needed for hemorrhoids or itching. As needed) 30 g 6  . levothyroxine (SYNTHROID, LEVOTHROID) 100 MCG tablet Take 1 tablet (100 mcg total) by mouth at bedtime. 30 tablet 6  . losartan (COZAAR) 25 MG tablet Take 25 mg by mouth at bedtime.    . Multiple Vitamin (MULTIVITAMIN WITH MINERALS) TABS tablet Take 1 tablet by mouth daily.    [name] octreotide (SANDOSTATIN LAR) 20 MG injection Inject 20 mg into the muscle every 28 (twenty-eight) days. 1 each 5  . pantoprazole (PROTONIX) 40 MG tablet Take 1 tablet (40 mg total) by mouth 2 (two) times daily. 180 tablet 3  . potassium chloride SA (K-DUR,KLOR-CON) 20 MEQ tablet Take 2 tablets (40 mEq total) by mouth daily. And take extra 1 pill with lasix when needed. 80 tablet 4  . PRESCRIPTION MEDICATION Ferumoxytol (FERAHEME) 510 mg in sodium chloride 0.9 % 100 mL IVPB (Once) Route: Intravenous as needed  (checked every 2 weeks)    . PRESCRIPTION MEDICATION Octreotide (SANDOSTATIN LAR) IM injection 20 mg (Once)Route: Intramuscular every 28 days    . simvastatin (ZOCOR) 80 MG tablet Take 0.5 tablets (40 mg total) by mouth at bedtime. 45 tablet 3  . tiotropium (SPIRIVA) 18 MCG inhalation  capsule Place 1 capsule (18 mcg total) into inhaler and inhale daily. 90 capsule 3  . vitamin B-12 (CYANOCOBALAMIN) 1000 MCG tablet Take 1,000 mcg by mouth at bedtime.    . vitamin C (ASCORBIC ACID) 500 MG tablet Take 1 tablet (500 mg total) by mouth 2 (two) times daily. 180 tablet 3  . nitroGLYCERIN (NITROSTAT) 0.4 MG SL tablet Place 0.4 mg under the tongue every 5 (five) minutes as needed for chest pain. Reported on 04/09/2016     No current facility-administered medications for this visit.     REVIEW OF SYSTEMS:   Constitutional: Denies fevers, chills or abnormal night sweats Eyes: Denies blurriness of vision, double vision or watery eyes Ears, nose, mouth, throat, and face: Denies mucositis or sore throat Respiratory: Denies cough, dyspnea or wheezes Cardiovascular: Denies palpitation, chest discomfort or lower extremity swelling Gastrointestinal:  Denies nausea, heartburn or change in bowel habits Skin: Denies abnormal skin rashes Lymphatics: Denies new lymphadenopathy or easy bruising Neurological:Denies numbness, tingling or new weaknesses Behavioral/Psych: Mood is stable, no new changes  All other systems were reviewed with the patient and are negative.  PHYSICAL EXAMINATION: ECOG PERFORMANCE STATUS: 1 - Symptomatic but completely ambulatory  Vitals:   10/24/16 0942  BP: 98/69  Pulse: 92  Resp: 19  Temp: 97.7 F (36.5 C)   Filed Weights   10/24/16 0942  Weight: 229 lb 8 oz (104.1 kg)   GENERAL:alert, no distress and comfortable SKIN: skin color, texture, turgor are normal, no rashes or significant lesions EYES: normal, conjunctiva are pink and non-injected, sclera clear OROPHARYNX:no  exudate, no erythema and lips, buccal mucosa, and tongue normal  NECK: supple, thyroid normal size, non-tender, without nodularity LYMPH:  no palpable lymphadenopathy in the cervical, axillary or inguinal LUNGS: clear to auscultation and percussion with normal breathing effort HEART: (+) LVAD blood flow, A-FIb ABDOMEN:abdomen soft, non-tender and normal bowel sounds, (+) percutaneous biliary drainage tube in the right upper quadrant of abdomen Musculoskeletal:no cyanosis of digits and no clubbing  PSYCH: alert & oriented x 3 with fluent speech NEURO: no focal motor/sensory deficits  LABORATORY DATA:  I have reviewed the data as listed CBC Latest Ref Rng & Units 10/24/2016 10/10/2016 09/26/2016  WBC 4.0 - 10.3 10e3/uL 8.6 8.1 7.3  Hemoglobin 13.0 - 17.1 g/dL 11.4(L) 12.7(L) 12.7(L)  Hematocrit 38.4 - 49.9 % 35.1(L) 40.1 39.6  Platelets 140 - 400 10e3/uL 138(L) 99(L) 105(L)    CMP Latest Ref Rng & Units 10/22/2016 09/26/2016 08/20/2016  Glucose 65 - 99 mg/dL 121(H) 118 113(H)  BUN 6 - 20 mg/dL 18 21.4 17  Creatinine 0.61 - 1.24 mg/dL 1.02 1.0 0.91  Sodium 135 - 145 mmol/L 137 139 139  Potassium 3.5 - 5.1 mmol/L 4.6 4.7 4.1  Chloride 101 - 111 mmol/L 103 - 108  CO2 22 - 32 mmol/L 26 24 24   Calcium 8.9 - 10.3 mg/dL 10.1 10.2 9.9  Total Protein 6.4 - 8.3 g/dL - 7.4 6.9  Total Bilirubin 0.20 - 1.20 mg/dL - 0.76 0.6  Alkaline Phos 40 - 150 U/L - 84 78  AST 5 - 34 U/L - 62(H) 66(H)  ALT 0 - 55 U/L - 29 32    PENDING  Results for ZERIN, GRONDAHL (MRN [id number]) as of 08/01/2016 08:28  Ref. Range 06/21/2016 10:24 07/04/2016 11:46 07/18/2016 10:51  Iron Latest Ref Range: 42 - 163 ug/dL 52  32 (L)  UIBC Latest Ref Range: 117 - 376 ug/dL 230  299  TIBC Latest  Ref Range: 202 - 409 ug/dL 283  331  %SAT Latest Ref Range: 20 - 55 % 19 (L)  10 (L)  Ferritin Latest Ref Range: 22 - 316 ng/ml  183    RADIOGRAPHIC STUDIES: I have personally reviewed the radiological images as listed and agreed  with the findings in the report. No results found.  ASSESSMENT & PLAN:  76 y.o. Caucasian male, with extensive cardiac history, including CAD, ischemic cardiomyopathy status post LVAD, on long-term Coumadin (now on Eliquis), presented with multiple episodes of GI bleeding and anemia.  1. 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 -His previous iron studies showed low ferritin level and serum iron, consistent with iron deficient anemia. This is secondary to his GI bleeding. -He responded to IV iron very well. 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  -His Coumadin has been changed to Eliquis  -Continue Feraheme if ferritin less than 100 or low iron level,  I'll set up infusion appointment if needed -He will also receive red blood cell if his hemoglobin less than 9  -Continue IM Sandostatin for his GI bleeding from AVM, recommended by his cardiologist Dr. Haroldine Laws. He has not had major GI bleeding since he started Sandostatin injection. -Continue labs every 2 weeks. He would like his blood drawn from his arm rather than his hand from now on.  -Hb 11.4 today, slightly lower than before. Possible IV iron needed, iron study result is still pending   2. Mild thrombocytopenia -Secondary to LVAD -Plt count has been stable   3. Nose bleeding -possible related to his meds (Eliquis, plavix and ASA) -If he has recurrent bleeding episode, I will consider topical amicar, may not be safe to use oral amicar due to his LVAD   4. chronic cholecystitis -Status post percutaneous bile drainage, and biliary sphincterotomy and stone removal by Dr. Ardis Hughs on 06/13/2016 -He was seen by see Drs. Gwenyth Allegra, surgery was not offered.   5. Atrial fibrillation -He'll follow-up with his Cardiologist.  Plan -Repeat CBC every 2  weeks and iron [facility]  -IV Feraheme  510mg  today and as needed if ferritin <100 or low serum iron. I asked the patient to call me the day after his lab test, to see if he needs IV Feraheme. His wife uses my chart. -2u RBC if Hb<9.0  -continue Sandostatin injection today and every 4 weeks -I'll see him back in 4 months.   All questions were answered. The patient knows to call the clinic with any problems, questions or concerns.  I spent 20 minutes counseling the patient face to face. The total time spent in the appointment was 25 minutes and more than 50% was on counseling.   This document serves as a record of services personally performed by Truitt Merle, MD. It was created on her behalf by Martinique Casey, a trained medical scribe. The creation of this record is based on the scribe's personal observations and the provider's statements to them. This document has been checked and approved by the attending provider.  I have reviewed the above documentation for accuracy and completeness, and I agree with the above information.     Truitt Merle, MD 10/24/2016     Addendum  Iron study showed ferritin 136, serum iron 42, saturation 15%. Will give one dose IV feraheme

## 2016-10-24 ENCOUNTER — Telehealth: Payer: Self-pay | Admitting: Hematology

## 2016-10-24 ENCOUNTER — Encounter: Payer: Self-pay | Admitting: Hematology

## 2016-10-24 ENCOUNTER — Ambulatory Visit (HOSPITAL_BASED_OUTPATIENT_CLINIC_OR_DEPARTMENT_OTHER): Payer: No Typology Code available for payment source

## 2016-10-24 ENCOUNTER — Other Ambulatory Visit (HOSPITAL_BASED_OUTPATIENT_CLINIC_OR_DEPARTMENT_OTHER): Payer: Medicare Other

## 2016-10-24 ENCOUNTER — Ambulatory Visit (HOSPITAL_BASED_OUTPATIENT_CLINIC_OR_DEPARTMENT_OTHER): Payer: No Typology Code available for payment source | Admitting: Hematology

## 2016-10-24 ENCOUNTER — Ambulatory Visit: Payer: No Typology Code available for payment source

## 2016-10-24 VITALS — BP 98/69 | HR 92 | Temp 97.7°F | Resp 19 | Ht 72.0 in | Wt 229.5 lb

## 2016-10-24 DIAGNOSIS — Q2730 Arteriovenous malformation, site unspecified: Secondary | ICD-10-CM | POA: Diagnosis not present

## 2016-10-24 DIAGNOSIS — I255 Ischemic cardiomyopathy: Secondary | ICD-10-CM | POA: Diagnosis not present

## 2016-10-24 DIAGNOSIS — K922 Gastrointestinal hemorrhage, unspecified: Secondary | ICD-10-CM

## 2016-10-24 DIAGNOSIS — D500 Iron deficiency anemia secondary to blood loss (chronic): Secondary | ICD-10-CM

## 2016-10-24 DIAGNOSIS — D696 Thrombocytopenia, unspecified: Secondary | ICD-10-CM | POA: Diagnosis not present

## 2016-10-24 DIAGNOSIS — D62 Acute posthemorrhagic anemia: Secondary | ICD-10-CM

## 2016-10-24 DIAGNOSIS — Z95811 Presence of heart assist device: Secondary | ICD-10-CM | POA: Diagnosis not present

## 2016-10-24 DIAGNOSIS — D509 Iron deficiency anemia, unspecified: Secondary | ICD-10-CM

## 2016-10-24 LAB — CBC & DIFF AND RETIC
BASO%: 0.2 % (ref 0.0–2.0)
BASOS ABS: 0 10*3/uL (ref 0.0–0.1)
EOS ABS: 0.3 10*3/uL (ref 0.0–0.5)
EOS%: 2.9 % (ref 0.0–7.0)
HEMATOCRIT: 35.1 % — AB (ref 38.4–49.9)
HEMOGLOBIN: 11.4 g/dL — AB (ref 13.0–17.1)
IMMATURE RETIC FRACT: 24.8 % — AB (ref 3.00–10.60)
LYMPH%: 6.7 % — AB (ref 14.0–49.0)
MCH: 31.8 pg (ref 27.2–33.4)
MCHC: 32.5 g/dL (ref 32.0–36.0)
MCV: 97.8 fL (ref 79.3–98.0)
MONO#: 0.6 10*3/uL (ref 0.1–0.9)
MONO%: 7.3 % (ref 0.0–14.0)
NEUT#: 7.1 10*3/uL — ABNORMAL HIGH (ref 1.5–6.5)
NEUT%: 82.9 % — ABNORMAL HIGH (ref 39.0–75.0)
Platelets: 138 10*3/uL — ABNORMAL LOW (ref 140–400)
RBC: 3.59 10*6/uL — ABNORMAL LOW (ref 4.20–5.82)
RDW: 16.7 % — ABNORMAL HIGH (ref 11.0–14.6)
Retic %: 4.3 % — ABNORMAL HIGH (ref 0.80–1.80)
Retic Ct Abs: 154.37 10*3/uL — ABNORMAL HIGH (ref 34.80–93.90)
WBC: 8.6 10*3/uL (ref 4.0–10.3)
lymph#: 0.6 10*3/uL — ABNORMAL LOW (ref 0.9–3.3)

## 2016-10-24 LAB — IRON AND TIBC
%SAT: 15 % — AB (ref 20–55)
IRON: 42 ug/dL (ref 42–163)
TIBC: 281 ug/dL (ref 202–409)
UIBC: 239 ug/dL (ref 117–376)

## 2016-10-24 LAB — FERRITIN: FERRITIN: 136 ng/mL (ref 22–316)

## 2016-10-24 MED ORDER — OCTREOTIDE ACETATE 20 MG IM KIT
20.0000 mg | PACK | Freq: Once | INTRAMUSCULAR | Status: AC
  Administered 2016-10-24: 20 mg via INTRAMUSCULAR
  Filled 2016-10-24: qty 1

## 2016-10-24 NOTE — Telephone Encounter (Signed)
Appointments scheduled per 1/25 LOS. Patient given AVS report and calendars with future scheduled appointments. Patient not available to come to lab appointments on 4/5 and 5/3, they will be out of town on these days. Labs scheduled for following week.

## 2016-10-24 NOTE — Patient Instructions (Signed)

## 2016-10-28 ENCOUNTER — Telehealth: Payer: Self-pay | Admitting: *Deleted

## 2016-10-28 NOTE — Telephone Encounter (Signed)
Called & spoke with wife & informed of iron sat. & Dr Ernestina Penna request for IV iron in next 1-2 wks.  [name] would like to schedule for 11/08/16 & message sent to scheduler.

## 2016-10-28 NOTE — Telephone Encounter (Signed)
-----   Message from Truitt Merle, MD sent at 10/24/2016 11:09 PM EST ----- Please call pt's wife that his iron level was slightly low (saturation), please set up iv feraheme once in one or two weeks. Thanks  Truitt Merle  10/24/2016

## 2016-10-29 ENCOUNTER — Telehealth: Payer: Self-pay | Admitting: Hematology

## 2016-10-29 NOTE — Telephone Encounter (Signed)
sw pt wife to confirm 2/9 appt at 1 pm per LOS

## 2016-11-03 NOTE — Progress Notes (Signed)
Patient ID: [name], male   DOB: April 07, 1941, 76 y.o.   MRN: [id number]   VAD CLINIC NOTE   [facility] [place] (Dr. 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 He is s/p LVAD HM II implanted 01/12/13 under DT criteria. In 2016 ICD extraction due to pocket infection.   GI Events   02/2013- Had EGD/Colonoscopy which was unrevealing.  6 mm polyp clipped and removed. 06/2013- Push enteroscopy was negative. Colonoscopy without source of bleeding. Capsule endoscopy--> Showed bleeding from duodenum. EGD--->Bleeding from duodenum with clip applied. 08/2014- Enteroscopy showed bleed from jejunum requiring 3 clips.  11/2015-  Enteroscopy was normal. 11/2015- Capsule 12/11/15 2 proximal small bowel AVMs, 2 nonbleeding polyps which were removed. Coumadin was switched to apixaban 01/2016 - EGD with AVM clipping 01/2016- Acute cholecystitis with cholecystostomy tube placed.  06/2016- Cholecystotomy tube removed   LVAD Clinic f/u:  Returns for follow up with his wife. Overall feeling very well. Able to do all activities without to much problem as long as he takes his time. No edema, orthopnea or PND. Cholecystotomy tube has been out. No ab pain. Remains on Eliquis. No bleeding. Takes lasix 1 - 3 times a week for edema. No pump alarms.   Has seen Dr. Burr Medico for iron infusions. Also getting octreotide.  VAD Indication: Destination Therapy  VAD interrogation & Equipment Management: Speed: 9000 Flow: 5.7 Power: 5.5 w PI: 5.1  Alarms: no clinical alarms Events:1 to 3 daily  Fixed speed: 9000 Low speed limit: 8400    Labs (2/16): LDL 305, HCT 38.9 Labs (3/16): K 3.9, creatinine 1.05, LFTs normal Labs (2/17): WBCs 11.2, INR 2.68.  Labs (6/17): K 3.3, creatinine 0.94, HCT 35.1, INR 2.3   Past  Medical History:  Diagnosis Date  . Anemia   . Asbestosis(501)    "6 years in the [place]" (05/24/2013)  . Atrial fibrillation ([place])    permanent  . Cancer ([place])    "scraped some off behind my left ear; fast moving; having it cut out 12/05/2015" (11/07/2015)  . CHF (congestive heart failure) ([place])   . Chronic anticoagulation    Afib and LVAD  . Chronic systolic heart failure ([place])   . 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 Complication of anesthesia    "they can't put me all the way to sleep cause of my heart" (11/07/2015)  . COPD (chronic obstructive pulmonary disease) ([place])   . Coronary artery disease   . Depression   . Epistaxis 11/2015, 07/2014  . History of blood transfusion 08/2015 X 10; 11/2015 X 2   "related to bleeding on the inside somewhere" (11/07/2015)  . Hyperlipidemia   . Hypertension   . Hypothyroidism   . Ischemic cardiomyopathy     CABG 2003, PCI 2007  EF 27%(myoview 2012  . LVAD (left ventricular assist device) present ([place]) 12/2012  . Myocardial infarction 1990's-2000   "2 in  ~ the 1990's; 1 in ~ 2000" (11/07/2015)  . Obesity   . On home oxygen therapy    "have it available; don't use it" (11/07/2015)  . OSA on CPAP    no longer on cpap  . Paroxysmal ventricular tachycardia ([place])   . Restless leg syndrome   . Shortness of  breath dyspnea     Current Outpatient Prescriptions  Medication Sig Dispense Refill  . albuterol (PROVENTIL) (2.5 MG/3ML) 0.083% nebulizer solution Take 2.5 mg by nebulization every 6 (six) hours as needed for wheezing or shortness of breath.    [name] amiodarone (PACERONE) 200 MG tablet Take 1 tablet (200 mg total) by mouth every evening. (Patient taking differently: Take 200 mg by mouth at bedtime. ) 90 tablet 3  . apixaban (ELIQUIS) 5 MG TABS tablet Take 1 tablet (5 mg total) by mouth 2 (two) times daily. 60 tablet 11  . budesonide-formoterol (SYMBICORT) 160-4.5 MCG/ACT inhaler Inhale 2 puffs into the lungs 2  (two) times daily. 1 Inhaler 3  . cholecalciferol (VITAMIN D) 1000 UNITS tablet Take 1 tablet (1,000 Units total) by mouth 2 (two) times daily. 180 tablet 3  . citalopram (CELEXA) 40 MG tablet Take 1 tablet (40 mg total) by mouth at bedtime. 90 tablet 3  . docusate sodium (COLACE) 100 MG capsule Take 100 mg by mouth 2 (two) times daily.     . furosemide (LASIX) 40 MG tablet Use only if a weight gain of 5 pounds in a week occurs or evident swelling is present (Patient taking differently: Take 40 mg by mouth daily as needed. Use only if a weight gain of 5 pounds in a week occurs or evident swelling is present) 30 tablet 4  . hydrocortisone (ANUSOL-HC) 2.5 % rectal cream Place 1 application rectally 2 (two) times daily. (Patient taking differently: Place 1 application rectally daily as needed for hemorrhoids or itching. As needed) 30 g 6  . levothyroxine (SYNTHROID, LEVOTHROID) 100 MCG tablet Take 1 tablet (100 mcg total) by mouth at bedtime. 30 tablet 6  . losartan (COZAAR) 25 MG tablet Take 25 mg by mouth at bedtime.    . Multiple Vitamin (MULTIVITAMIN WITH MINERALS) TABS tablet Take 1 tablet by mouth daily.    . nitroGLYCERIN (NITROSTAT) 0.4 MG SL tablet Place 0.4 mg under the tongue every 5 (five) minutes as needed for chest pain. Reported on 04/09/2016    . octreotide (SANDOSTATIN LAR) 20 MG injection Inject 20 mg into the muscle every 28 (twenty-eight) days. 1 each 5  . pantoprazole (PROTONIX) 40 MG tablet Take 1 tablet (40 mg total) by mouth 2 (two) times daily. 180 tablet 3  . potassium chloride SA (K-DUR,KLOR-CON) 20 MEQ tablet Take 2 tablets (40 mEq total) by mouth daily. And take extra 1 pill with lasix when needed. 80 tablet 4  . PRESCRIPTION MEDICATION Ferumoxytol (FERAHEME) 510 mg in sodium chloride 0.9 % 100 mL IVPB (Once) Route: Intravenous as needed (checked every 2 weeks)    . PRESCRIPTION MEDICATION Octreotide (SANDOSTATIN LAR) IM injection 20 mg (Once)Route: Intramuscular every 28 days     . simvastatin (ZOCOR) 80 MG tablet Take 0.5 tablets (40 mg total) by mouth at bedtime. 45 tablet 3  . tiotropium (SPIRIVA) 18 MCG inhalation capsule Place 1 capsule (18 mcg total) into inhaler and inhale daily. 90 capsule 3  . vitamin B-12 (CYANOCOBALAMIN) 1000 MCG tablet Take 1,000 mcg by mouth at bedtime.    . vitamin C (ASCORBIC ACID) 500 MG tablet Take 1 tablet (500 mg total) by mouth 2 (two) times daily. 180 tablet 3   No current facility-administered medications for this encounter.     Tape  REVIEW OF SYSTEMS: All systems negative except as listed in HPI, PMH and Problem list.   Vital Signs:  Doppler Pressure:90 Automatc BP: 104/66 (88) HR: 72 SPO2:  99 %  Weight: 227.8 lb w/o eqt Last weight: 221.6 lb Home weights: 220 lbs   GENERAL:Well appearing, male; NAD; wife present. Ambulated in the clinic without difficulty.  HEENT: normal  NECK: Supple, JVP flat  no bruits.  No lymphadenopathy or thyromegaly appreciated.   CARDIAC: Mechanical heart sounds with LVAD hum present.  LUNGS:  Clear  ABDOMEN:  Obese, Soft, round, nontender, positive bowel sounds x4.     LVAD exit site: well-healed and incorporated.  Dressing dry and intact.  No erythema, drainage, odor or tenderness.  Stabilization device present and accurately applied.  Driveline dressing is being changed daily per sterile technique. Changed in clinic EXTREMITIES:  Warm and dry, no cyanosis, clubbing, no edema NEUROLOGIC:  Alert and oriented x 4.  Gait favors left leg.  Weak plantar flexion. No aphasia.  No dysarthria.  Affect pleasant.    ASSESSMENT AND PLAN:  1. Chronic Systolic HF: s/p LVAD implant 12/2012 for DT.  NYHA II-III.  - Volume status stable.  Not on BB due to previous bradycardia.  - Continue diuretics as needed.  - Reinforced the need and importance of daily weights, a low sodium diet, and fluid restriction (less than 2 L a day). Instructed to call the HF clinic if weight increases more than 3 lbs  overnight or 5 lbs in a week.  2. Anticoagulation management: On apixaban so no INR. Marland KitchenHe is not on ASA because of bleeding. He is now getting monthly IM octreotide. Followed by Hematology. Getting iron infusions 3. Anemia/ GIB:  Now on apixaban due to recurrent GI bleeding with normal endoscopy on warfarin. Hgb stable today at 11.4 Followed by hematology for  feraheme and octreotide.    4. LVAD: HM II.       - VAD parameters stable.  Occasioal PI events 5. PAF: - Mostly chronic now. Tolerates well. Rate controlled. Stop amio  6. Depression: Stable. Continue Celexa 40 mg daily 7. OSA: Using CPAP.  8. Hypoxemia:  Uses home oxygen at night.  This may be due to his baseline COPD. 9. ICD infection - s/p extraction 03/2015.  10. Chronic cholecystitis:  ERCP with removal of 2 stones and sphincterotomy.  C-tube pulled by Dr. Donne Hazel and doing well. If recurrent symptoms will need cholecystectomy  11. LLE weakness: Has seen Dr. Ninfa Linden in ortho. Doubt herniated disk. Recommended PT   Glori Bickers MD 11/03/2016

## 2016-11-07 ENCOUNTER — Other Ambulatory Visit (HOSPITAL_BASED_OUTPATIENT_CLINIC_OR_DEPARTMENT_OTHER): Payer: Medicare Other

## 2016-11-07 DIAGNOSIS — D500 Iron deficiency anemia secondary to blood loss (chronic): Secondary | ICD-10-CM

## 2016-11-07 DIAGNOSIS — D509 Iron deficiency anemia, unspecified: Secondary | ICD-10-CM

## 2016-11-07 DIAGNOSIS — K922 Gastrointestinal hemorrhage, unspecified: Secondary | ICD-10-CM

## 2016-11-07 LAB — CBC & DIFF AND RETIC
BASO%: 0.3 % (ref 0.0–2.0)
Basophils Absolute: 0 10*3/uL (ref 0.0–0.1)
EOS ABS: 0.3 10*3/uL (ref 0.0–0.5)
EOS%: 3.5 % (ref 0.0–7.0)
HCT: 36.4 % — ABNORMAL LOW (ref 38.4–49.9)
HGB: 11.1 g/dL — ABNORMAL LOW (ref 13.0–17.1)
IMMATURE RETIC FRACT: 27.4 % — AB (ref 3.00–10.60)
LYMPH%: 7.4 % — ABNORMAL LOW (ref 14.0–49.0)
MCH: 30.7 pg (ref 27.2–33.4)
MCHC: 30.5 g/dL — ABNORMAL LOW (ref 32.0–36.0)
MCV: 100.8 fL — AB (ref 79.3–98.0)
MONO#: 0.6 10*3/uL (ref 0.1–0.9)
MONO%: 7.7 % (ref 0.0–14.0)
NEUT%: 81.1 % — ABNORMAL HIGH (ref 39.0–75.0)
NEUTROS ABS: 6.3 10*3/uL (ref 1.5–6.5)
NRBC: 0 % (ref 0–0)
Platelets: 178 10*3/uL (ref 140–400)
RBC: 3.61 10*6/uL — AB (ref 4.20–5.82)
RDW: 17.8 % — AB (ref 11.0–14.6)
Retic %: 7.87 % — ABNORMAL HIGH (ref 0.80–1.80)
Retic Ct Abs: 284.11 10*3/uL — ABNORMAL HIGH (ref 34.80–93.90)
WBC: 7.8 10*3/uL (ref 4.0–10.3)
lymph#: 0.6 10*3/uL — ABNORMAL LOW (ref 0.9–3.3)

## 2016-11-07 LAB — IRON AND TIBC
%SAT: 15 % — ABNORMAL LOW (ref 20–55)
Iron: 47 ug/dL (ref 42–163)
TIBC: 310 ug/dL (ref 202–409)
UIBC: 263 ug/dL (ref 117–376)

## 2016-11-08 ENCOUNTER — Ambulatory Visit (HOSPITAL_BASED_OUTPATIENT_CLINIC_OR_DEPARTMENT_OTHER): Payer: No Typology Code available for payment source

## 2016-11-08 VITALS — BP 94/68 | HR 62 | Temp 97.7°F | Resp 18

## 2016-11-08 DIAGNOSIS — K922 Gastrointestinal hemorrhage, unspecified: Secondary | ICD-10-CM

## 2016-11-08 DIAGNOSIS — D500 Iron deficiency anemia secondary to blood loss (chronic): Secondary | ICD-10-CM | POA: Diagnosis not present

## 2016-11-08 DIAGNOSIS — Q2730 Arteriovenous malformation, site unspecified: Secondary | ICD-10-CM

## 2016-11-08 DIAGNOSIS — D62 Acute posthemorrhagic anemia: Secondary | ICD-10-CM

## 2016-11-08 MED ORDER — SODIUM CHLORIDE 0.9 % IV SOLN
INTRAVENOUS | Status: DC
  Administered 2016-11-08: 13:00:00 via INTRAVENOUS

## 2016-11-08 MED ORDER — FERUMOXYTOL INJECTION 510 MG/17 ML
510.0000 mg | Freq: Once | INTRAVENOUS | Status: AC
  Administered 2016-11-08: 510 mg via INTRAVENOUS
  Filled 2016-11-08: qty 17

## 2016-11-08 NOTE — Patient Instructions (Signed)
Ferumoxytol injection (Feraheme) What is this medicine? FERUMOXYTOL is an iron complex. Iron is used to make healthy red blood cells, which carry oxygen and nutrients throughout the body. This medicine is used to treat iron deficiency anemia in people with chronic kidney disease. This medicine may be used for other purposes; ask your health care provider or pharmacist if you have questions. What should I tell my health care provider before I take this medicine? They need to know if you have any of these conditions: -anemia not caused by low iron levels -high levels of iron in the blood -magnetic resonance imaging (MRI) test scheduled -an unusual or allergic reaction to iron, other medicines, foods, dyes, or preservatives -pregnant or trying to get pregnant -breast-feeding How should I use this medicine? This medicine is for injection into a vein. It is given by a health care professional in a hospital or clinic setting. Talk to your pediatrician regarding the use of this medicine in children. Special care may be needed. Overdosage: If you think you have taken too much of this medicine contact a poison control center or emergency room at once. NOTE: This medicine is only for you. Do not share this medicine with others. What if I miss a dose? It is important not to miss your dose. Call your doctor or health care professional if you are unable to keep an appointment. What may interact with this medicine? This medicine may interact with the following medications: -other iron products This list may not describe all possible interactions. Give your health care provider a list of all the medicines, herbs, non-prescription drugs, or dietary supplements you use. Also tell them if you smoke, drink alcohol, or use illegal drugs. Some items may interact with your medicine. What should I watch for while using this medicine? Visit your doctor or healthcare professional regularly. Tell your doctor or  healthcare professional if your symptoms do not start to get better or if they get worse. You may need blood work done while you are taking this medicine. You may need to follow a special diet. Talk to your doctor. Foods that contain iron include: whole grains/cereals, dried fruits, beans, or peas, leafy green vegetables, and organ meats (liver, kidney). What side effects may I notice from receiving this medicine? Side effects that you should report to your doctor or health care professional as soon as possible: -allergic reactions like skin rash, itching or hives, swelling of the face, lips, or tongue -breathing problems -changes in blood pressure -feeling faint or lightheaded, falls -fever or chills -flushing, sweating, or hot feelings -swelling of the ankles or feet Side effects that usually do not require medical attention (Report these to your doctor or health care professional if they continue or are bothersome.): -diarrhea -headache -nausea, vomiting -stomach pain This list may not describe all possible side effects. Call your doctor for medical advice about side effects. You may report side effects to FDA at 1-800-FDA-1088. Where should I keep my medicine? This drug is given in a hospital or clinic and will not be stored at home. NOTE: This sheet is a summary. It may not cover all possible information. If you have questions about this medicine, talk to your doctor, pharmacist, or health care provider.    2016, Elsevier/Gold Standard. (2012-05-01 15:23:36)   Octreotide injection solution (Sandostatin) What is this medicine? OCTREOTIDE (ok TREE oh tide) is used to reduce blood levels of growth hormone in patients with a condition called acromegaly. This medicine also reduces flushing  and watery diarrhea caused by certain types of cancer. This medicine may be used for other purposes; ask your health care provider or pharmacist if you have questions. What should I tell my health care  provider before I take this medicine? They need to know if you have any of these conditions: -gallbladder disease -kidney disease -liver disease -an unusual or allergic reaction to octreotide, other medicines, foods, dyes, or preservatives -pregnant or trying to get pregnant -breast-feeding How should I use this medicine? This medicine is for injection under the skin or into a vein (only in emergency situations). It is usually given by a health care professional in a hospital or clinic setting. If you get this medicine at home, you will be taught how to prepare and give this medicine. Allow the injection solution to come to room temperature before use. Do not warm it artificially. Use exactly as directed. Take your medicine at regular intervals. Do not take your medicine more often than directed. It is important that you put your used needles and syringes in a special sharps container. Do not put them in a trash can. If you do not have a sharps container, call your pharmacist or healthcare provider to get one. Talk to your pediatrician regarding the use of this medicine in children. Special care may be needed. Overdosage: If you think you have taken too much of this medicine contact a poison control center or emergency room at once. NOTE: This medicine is only for you. Do not share this medicine with others. What if I miss a dose? If you miss a dose, take it as soon as you can. If it is almost time for your next dose, take only that dose. Do not take double or extra doses. What may interact with this medicine? Do not take this medicine with any of the following medications: -cisapride -droperidol -general anesthetics -grepafloxacin -perphenazine -thioridazine This medicine may also interact with the following medications: -bromocriptine -cyclosporine -diuretics -medicines for blood pressure, heart disease, irregular heart beat -medicines for diabetes, including insulin -quinidine This  list may not describe all possible interactions. Give your health care provider a list of all the medicines, herbs, non-prescription drugs, or dietary supplements you use. Also tell them if you smoke, drink alcohol, or use illegal drugs. Some items may interact with your medicine. What should I watch for while using this medicine? Visit your doctor or health care professional for regular checks on your progress. To help reduce irritation at the injection site, use a different site for each injection and make sure the solution is at room temperature before use. This medicine may cause increases or decreases in blood sugar. Signs of high blood sugar include frequent urination, unusual thirst, flushed or dry skin, difficulty breathing, drowsiness, stomach ache, nausea, vomiting or dry mouth. Signs of low blood sugar include chills, cool, pale skin or cold sweats, drowsiness, extreme hunger, fast heartbeat, headache, nausea, nervousness or anxiety, shakiness, trembling, unsteadiness, tiredness, or weakness. Contact your doctor or health care professional right away if you experience any of these symptoms. What side effects may I notice from receiving this medicine? Side effects that you should report to your doctor or health care professional as soon as possible: -allergic reactions like skin rash, itching or hives, swelling of the face, lips, or tongue -changes in blood sugar -changes in heart rate -severe stomach pain Side effects that usually do not require medical attention (report to your doctor or health care professional if they continue or  are bothersome): -diarrhea or constipation -gas or stomach pain -nausea, vomiting -pain, redness, swelling and irritation at site where injected This list may not describe all possible side effects. Call your doctor for medical advice about side effects. You may report side effects to FDA at 1-800-FDA-1088. Where should I keep my medicine? Keep out of the  reach of children. Store in a refrigerator between 2 and 8 degrees C (36 and 46 degrees F). Protect from light. Allow to come to room temperature naturally. Do not use artificial heat. If protected from light, the injection may be stored at room temperature between 20 and 30 degrees C (70 and 86 degrees F) for 14 days. After the initial use, throw away any unused portion of a multiple dose vial after 14 days. Throw away unused portions of the ampules after use. NOTE: This sheet is a summary. It may not cover all possible information. If you have questions about this medicine, talk to your doctor, pharmacist, or health care provider.    2016, Elsevier/Gold Standard. (2008-04-12 16:56:04)

## 2016-11-09 ENCOUNTER — Other Ambulatory Visit: Payer: Self-pay | Admitting: Hematology

## 2016-11-11 ENCOUNTER — Telehealth: Payer: Self-pay | Admitting: *Deleted

## 2016-11-11 NOTE — Telephone Encounter (Signed)
-----   Message from Truitt Merle, MD sent at 11/09/2016  8:11 PM EST ----- Please let pt know the lab results. Iron level OK, no iv iron for now, continue monitoring.   Truitt Merle  11/09/2016

## 2016-11-11 NOTE — Telephone Encounter (Signed)
Spoke with wife [name] and informed her of Dr. Ernestina Penna instructions below.  [name] voiced understanding, and stated she would relay message to pt.

## 2016-11-12 ENCOUNTER — Telehealth (HOSPITAL_COMMUNITY): Payer: Self-pay | Admitting: *Deleted

## 2016-11-12 ENCOUNTER — Encounter: Payer: Self-pay | Admitting: Hematology

## 2016-11-12 DIAGNOSIS — D508 Other iron deficiency anemias: Secondary | ICD-10-CM

## 2016-11-12 NOTE — Telephone Encounter (Signed)
Referral placed for Dr Burr Medico at cancer center for Iron infusion per Dr. Haroldine Laws.

## 2016-11-12 NOTE — Progress Notes (Unsigned)
Patient's spouse called and states Dr. Clayborne Dana office will fax over a referral to see Dr.Feng.

## 2016-11-21 ENCOUNTER — Other Ambulatory Visit (HOSPITAL_BASED_OUTPATIENT_CLINIC_OR_DEPARTMENT_OTHER): Payer: No Typology Code available for payment source

## 2016-11-21 ENCOUNTER — Ambulatory Visit (HOSPITAL_BASED_OUTPATIENT_CLINIC_OR_DEPARTMENT_OTHER): Payer: No Typology Code available for payment source

## 2016-11-21 VITALS — BP 97/72 | HR 64 | Temp 97.8°F | Resp 20

## 2016-11-21 DIAGNOSIS — Q2730 Arteriovenous malformation, site unspecified: Secondary | ICD-10-CM

## 2016-11-21 DIAGNOSIS — D500 Iron deficiency anemia secondary to blood loss (chronic): Secondary | ICD-10-CM

## 2016-11-21 DIAGNOSIS — K922 Gastrointestinal hemorrhage, unspecified: Secondary | ICD-10-CM

## 2016-11-21 DIAGNOSIS — D62 Acute posthemorrhagic anemia: Secondary | ICD-10-CM

## 2016-11-21 DIAGNOSIS — D509 Iron deficiency anemia, unspecified: Secondary | ICD-10-CM

## 2016-11-21 LAB — CBC & DIFF AND RETIC
BASO%: 0.3 % (ref 0.0–2.0)
BASOS ABS: 0 10*3/uL (ref 0.0–0.1)
EOS ABS: 0.3 10*3/uL (ref 0.0–0.5)
EOS%: 3.7 % (ref 0.0–7.0)
HEMATOCRIT: 37.7 % — AB (ref 38.4–49.9)
HEMOGLOBIN: 11.6 g/dL — AB (ref 13.0–17.1)
IMMATURE RETIC FRACT: 23.3 % — AB (ref 3.00–10.60)
LYMPH#: 0.8 10*3/uL — AB (ref 0.9–3.3)
LYMPH%: 12.3 % — ABNORMAL LOW (ref 14.0–49.0)
MCH: 31.2 pg (ref 27.2–33.4)
MCHC: 30.8 g/dL — ABNORMAL LOW (ref 32.0–36.0)
MCV: 101.3 fL — ABNORMAL HIGH (ref 79.3–98.0)
MONO#: 0.4 10*3/uL (ref 0.1–0.9)
MONO%: 5.6 % (ref 0.0–14.0)
NEUT#: 5.3 10*3/uL (ref 1.5–6.5)
NEUT%: 78.1 % — ABNORMAL HIGH (ref 39.0–75.0)
Platelets: 135 10*3/uL — ABNORMAL LOW (ref 140–400)
RBC: 3.72 10*6/uL — ABNORMAL LOW (ref 4.20–5.82)
RDW: 19.1 % — AB (ref 11.0–14.6)
RETIC %: 6.78 % — AB (ref 0.80–1.80)
RETIC CT ABS: 252.22 10*3/uL — AB (ref 34.80–93.90)
WBC: 6.8 10*3/uL (ref 4.0–10.3)

## 2016-11-21 LAB — COMPREHENSIVE METABOLIC PANEL
ALT: 22 U/L (ref 0–55)
AST: 63 U/L — AB (ref 5–34)
Albumin: 3.8 g/dL (ref 3.5–5.0)
Alkaline Phosphatase: 58 U/L (ref 40–150)
Anion Gap: 8 mEq/L (ref 3–11)
BUN: 25 mg/dL (ref 7.0–26.0)
CO2: 24 meq/L (ref 22–29)
Calcium: 9.9 mg/dL (ref 8.4–10.4)
Chloride: 106 mEq/L (ref 98–109)
Creatinine: 1.2 mg/dL (ref 0.7–1.3)
EGFR: 62 mL/min/{1.73_m2} — AB (ref >90)
GLUCOSE: 136 mg/dL (ref 70–140)
POTASSIUM: 4.9 meq/L (ref 3.5–5.1)
SODIUM: 138 meq/L (ref 136–145)
TOTAL PROTEIN: 7.3 g/dL (ref 6.4–8.3)
Total Bilirubin: 0.99 mg/dL (ref 0.20–1.20)

## 2016-11-21 LAB — FERRITIN: Ferritin: 193 ng/ml (ref 22–316)

## 2016-11-21 MED ORDER — OCTREOTIDE ACETATE 20 MG IM KIT
20.0000 mg | PACK | Freq: Once | INTRAMUSCULAR | Status: AC
  Administered 2016-11-21: 20 mg via INTRAMUSCULAR
  Filled 2016-11-21: qty 1

## 2016-11-21 NOTE — Patient Instructions (Signed)

## 2016-12-02 ENCOUNTER — Telehealth: Payer: Self-pay | Admitting: *Deleted

## 2016-12-02 NOTE — Telephone Encounter (Signed)
Per patient's family member I have given her appt date/times for April

## 2016-12-05 ENCOUNTER — Telehealth (HOSPITAL_COMMUNITY): Payer: Self-pay | Admitting: Infectious Diseases

## 2016-12-05 ENCOUNTER — Telehealth: Payer: Self-pay | Admitting: *Deleted

## 2016-12-05 ENCOUNTER — Other Ambulatory Visit (HOSPITAL_BASED_OUTPATIENT_CLINIC_OR_DEPARTMENT_OTHER): Payer: No Typology Code available for payment source

## 2016-12-05 ENCOUNTER — Ambulatory Visit: Payer: No Typology Code available for payment source

## 2016-12-05 ENCOUNTER — Ambulatory Visit (HOSPITAL_COMMUNITY)
Admission: RE | Admit: 2016-12-05 | Discharge: 2016-12-05 | Disposition: A | Discharge status: Home / Self Care | Payer: Medicare Other | Source: Ambulatory Visit | Attending: Hematology | Admitting: Hematology

## 2016-12-05 ENCOUNTER — Other Ambulatory Visit: Payer: Self-pay | Admitting: *Deleted

## 2016-12-05 DIAGNOSIS — D649 Anemia, unspecified: Secondary | ICD-10-CM | POA: Diagnosis present

## 2016-12-05 DIAGNOSIS — K922 Gastrointestinal hemorrhage, unspecified: Secondary | ICD-10-CM

## 2016-12-05 DIAGNOSIS — D500 Iron deficiency anemia secondary to blood loss (chronic): Secondary | ICD-10-CM | POA: Diagnosis not present

## 2016-12-05 DIAGNOSIS — D62 Acute posthemorrhagic anemia: Secondary | ICD-10-CM

## 2016-12-05 DIAGNOSIS — D509 Iron deficiency anemia, unspecified: Secondary | ICD-10-CM

## 2016-12-05 LAB — CBC & DIFF AND RETIC
BASO%: 0.4 % (ref 0.0–2.0)
Basophils Absolute: 0 10*3/uL (ref 0.0–0.1)
EOS%: 2.6 % (ref 0.0–7.0)
Eosinophils Absolute: 0.2 10*3/uL (ref 0.0–0.5)
HCT: 26.4 % — ABNORMAL LOW (ref 38.4–49.9)
HEMOGLOBIN: 8.4 g/dL — AB (ref 13.0–17.1)
IMMATURE RETIC FRACT: 31.1 % — AB (ref 3.00–10.60)
LYMPH%: 7.7 % — AB (ref 14.0–49.0)
MCH: 31.4 pg (ref 27.2–33.4)
MCHC: 32 g/dL (ref 32.0–36.0)
MCV: 98.1 fL — AB (ref 79.3–98.0)
MONO#: 0.6 10*3/uL (ref 0.1–0.9)
MONO%: 7.7 % (ref 0.0–14.0)
NEUT#: 6 10*3/uL (ref 1.5–6.5)
NEUT%: 81.6 % — AB (ref 39.0–75.0)
Platelets: 155 10*3/uL (ref 140–400)
RBC: 2.69 10*6/uL — ABNORMAL LOW (ref 4.20–5.82)
RDW: 20.3 % — ABNORMAL HIGH (ref 11.0–14.6)
Retic %: 10.47 % — ABNORMAL HIGH (ref 0.80–1.80)
Retic Ct Abs: 281.64 10*3/uL — ABNORMAL HIGH (ref 34.80–93.90)
WBC: 7.3 10*3/uL (ref 4.0–10.3)
lymph#: 0.6 10*3/uL — ABNORMAL LOW (ref 0.9–3.3)

## 2016-12-05 LAB — ABO/RH: ABO/RH(D): O POS

## 2016-12-05 LAB — PREPARE RBC (CROSSMATCH)

## 2016-12-05 NOTE — Telephone Encounter (Signed)
Call returned to [facility] re: Tennis's Hgb of 8.4 (down from 11.6 2w prior). She reports she is at the cancer center now with Joneen Caraway and cannot reach Dr. Ernestina Penna office. She feels as if he needs blood. Denies any changes to BM's and no other overt bleeding that would explain the anemia. He did not have Iron studies performed today. I called Dr. Ernestina Penna office to discuss - they will forward message to triage nurse/Dr. Feng's nurse for further consideration of treatment. I informed [name] of this. D/W Dr. Aundra Dubin and will hold 2 doses of Eliquis (he is on 5 mg BID).   Janene Madeira, RN VAD Coordinator   Office: [phone] 24/7 Emergency VAD Pager: [phone]

## 2016-12-05 NOTE — Telephone Encounter (Signed)
Talked with pt & wife today.  Pt fatigued & hgb low for him.  Dr Burr Medico wants him to have blood with hgb < 9.  Unable to find a place for blood transfusion in a timely manner & informed pt not comfortable giving him blood on Sat at our facility since he has never had blood here & is an LVAD pt.  Called admitting & set up obs bed for tomorrow & they will call pt when bed ready.  Informed pt/wife. Will set up for fereheme next week.  LOS sent.

## 2016-12-06 ENCOUNTER — Observation Stay (HOSPITAL_COMMUNITY)
Admission: AD | Admit: 2016-12-06 | Discharge: 2016-12-06 | Disposition: A | Discharge status: Home / Self Care | Payer: Medicare Other | Source: Ambulatory Visit | Attending: Hematology | Admitting: Hematology

## 2016-12-06 ENCOUNTER — Encounter (HOSPITAL_COMMUNITY): Payer: Self-pay

## 2016-12-06 ENCOUNTER — Ambulatory Visit: Payer: No Typology Code available for payment source

## 2016-12-06 ENCOUNTER — Telehealth (HOSPITAL_COMMUNITY): Payer: Self-pay | Admitting: Infectious Diseases

## 2016-12-06 DIAGNOSIS — I252 Old myocardial infarction: Secondary | ICD-10-CM | POA: Diagnosis not present

## 2016-12-06 DIAGNOSIS — I110 Hypertensive heart disease with heart failure: Secondary | ICD-10-CM | POA: Diagnosis not present

## 2016-12-06 DIAGNOSIS — I5022 Chronic systolic (congestive) heart failure: Secondary | ICD-10-CM | POA: Insufficient documentation

## 2016-12-06 DIAGNOSIS — Z95810 Presence of automatic (implantable) cardiac defibrillator: Secondary | ICD-10-CM | POA: Diagnosis not present

## 2016-12-06 DIAGNOSIS — Z87891 Personal history of nicotine dependence: Secondary | ICD-10-CM | POA: Insufficient documentation

## 2016-12-06 DIAGNOSIS — D500 Iron deficiency anemia secondary to blood loss (chronic): Secondary | ICD-10-CM | POA: Diagnosis not present

## 2016-12-06 DIAGNOSIS — J449 Chronic obstructive pulmonary disease, unspecified: Secondary | ICD-10-CM | POA: Insufficient documentation

## 2016-12-06 DIAGNOSIS — K922 Gastrointestinal hemorrhage, unspecified: Secondary | ICD-10-CM

## 2016-12-06 DIAGNOSIS — I25810 Atherosclerosis of coronary artery bypass graft(s) without angina pectoris: Secondary | ICD-10-CM | POA: Insufficient documentation

## 2016-12-06 DIAGNOSIS — E039 Hypothyroidism, unspecified: Secondary | ICD-10-CM | POA: Diagnosis not present

## 2016-12-06 DIAGNOSIS — Z951 Presence of aortocoronary bypass graft: Secondary | ICD-10-CM | POA: Diagnosis not present

## 2016-12-06 MED ORDER — SODIUM CHLORIDE 0.9% FLUSH: 10.0000 mL | INTRAVENOUS | Status: DC | PRN

## 2016-12-06 MED ORDER — SODIUM CHLORIDE 0.9% FLUSH: 3.0000 mL | INTRAVENOUS | Status: DC | PRN

## 2016-12-06 MED ORDER — SODIUM CHLORIDE 0.9 % IV SOLN
250.0000 mL | Freq: Once | INTRAVENOUS | Status: AC
  Administered 2016-12-06: 250 mL via INTRAVENOUS

## 2016-12-06 MED ORDER — HEPARIN SOD (PORK) LOCK FLUSH 100 UNIT/ML IV SOLN: 500.0000 [IU] | Freq: Every day | INTRAVENOUS | Status: DC | PRN

## 2016-12-06 MED ORDER — HEPARIN SOD (PORK) LOCK FLUSH 100 UNIT/ML IV SOLN: 250.0000 [IU] | INTRAVENOUS | Status: DC | PRN

## 2016-12-06 NOTE — Telephone Encounter (Signed)
Education provided for VAD aware status to [facility] RN responsible for [name]'s care while he is receiving his transfusions today. Specific attention provided for:    1. VAD RN Coordinator 24/7 Pager: [phone]   2. NO External Chest Compressions. No palpable pulse is normal.   3. Patient's may not have reliable automated BP measurements with continuous flow function of VAD and doppler monitoring may be necessary.    4. Explained 'green arrows' are external sign the pump is on while looking at controller   5. Wife [name] will remain with the patient as she is his trained caregiver and can account for equipment management independently with [place] should alarms occur or questions come up. Back up equipment is verified as present.    6. Should he require overnight admission he must be transferred to [facility] for VAD equipment needs (PM/UBC).    All are in agreement of the plan and have not only my pager but direct cell phone.   Janene Madeira, RN [place] Coordinator   Office: [phone] Cell: [phone]  24/7 Emergency VAD Pager: [phone]

## 2016-12-06 NOTE — H&P (Signed)
History and Physical examination    [name] DOB: [birth year] MR# [id number]#:[id number]  HISTORY OF PRESENT ILLNESS : The patient came in today for blood transfusion. He reports he is feeling well, though he is fatigued. He denies chest pain. He reports normal bowel movements. Denies hematochezia. He has recently received a blood transfusion. The patient is scheduled for IV Feraheme next week.   Objective:  Vitals:   12/06/16 1440 12/06/16 1509  BP: 100/78 (!) 76/66  Pulse: 62 (!) 54  Resp: 20 20  Temp: 98.3 F (36.8 C) 98.4 F (36.9 C)    Body mass index is 29.06 kg/m.  Intake/Output Summary (Last 24 hours) at 12/06/16 1525 Last data filed at 12/06/16 1454  Gross per 24 hour  Intake              410 ml  Output                0 ml  Net              410 ml     Sclerae unicteric  Oropharynx clear  No peripheral adenopathy  Lungs clear -- no rales or rhonchi  Heart regular rate and rhythm  Abdomen benign  MSK no focal spinal tenderness, no peripheral edema  Neuro nonfocal  CBG (last 3)  No results for input(s): GLUCAP in the last 72 hours.   Labs:  Lab Results  Component Value Date   WBC 7.3 12/05/2016   HGB 8.4 (L) 12/05/2016   HCT 26.4 (L) 12/05/2016   MCV 98.1 (H) 12/05/2016   PLT 155 12/05/2016   NEUTROABS 6.0 12/05/2016   CMP Latest Ref Rng & Units 11/21/2016 10/22/2016 09/26/2016  Glucose 70 - 140 mg/dl 136 121(H) 118  BUN 7.0 - 26.0 mg/dL 25.0 18 21.4  Creatinine 0.7 - 1.3 mg/dL 1.2 1.02 1.0  Sodium 136 - 145 mEq/L 138 137 139  Potassium 3.5 - 5.1 mEq/L 4.9 4.6 4.7  Chloride 101 - 111 mmol/L - 103 -  CO2 22 - 29 mEq/L 24 26 24   Calcium 8.4 - 10.4 mg/dL 9.9 10.1 10.2  Total Protein 6.4 - 8.3 g/dL 7.3 - 7.4  Total Bilirubin 0.20 - 1.20 mg/dL 0.99 - 0.76  Alkaline Phos 40 - 150 U/L 58 - 84  AST 5 - 34 U/L 63(H) - 62(H)  ALT 0 - 55 U/L 22 - 29    Urine Studies No results for input(s): UHGB, CRYS in the last 72 hours.  Invalid input(s):  UACOL, UAPR, USPG, UPH, UTP, UGL, UKET, UBIL, UNIT, UROB, ULEU, UEPI, UWBC, URBC, UBAC, CAST, UCOM, BILUA  Basic Metabolic Panel: No results for input(s): NA, K, CL, CO2, GLUCOSE, BUN, CREATININE, CALCIUM, MG, PHOS in the last 168 hours. GFR Estimated Creatinine Clearance: 64.2 mL/min (by C-G formula based on SCr of 1.2 mg/dL). Liver Function Tests: No results for input(s): AST, ALT, ALKPHOS, BILITOT, PROT, ALBUMIN in the last 168 hours. No results for input(s): LIPASE, AMYLASE in the last 168 hours. No results for input(s): AMMONIA in the last 168 hours. Coagulation profile No results for input(s): INR, PROTIME in the last 168 hours.  CBC:  Recent Labs Lab 12/05/16 1028  WBC 7.3  NEUTROABS 6.0  HGB 8.4*  HCT 26.4*  MCV 98.1*  PLT 155   Cardiac Enzymes: No results for input(s): CKTOTAL, CKMB, CKMBINDEX, TROPONINI in the last 168 hours. BNP: Invalid input(s): POCBNP CBG: No results for input(s): GLUCAP in the last 168 hours.  D-Dimer No results for input(s): DDIMER in the last 72 hours. Hgb A1c No results for input(s): HGBA1C in the last 72 hours. Lipid Profile No results for input(s): CHOL, HDL, LDLCALC, TRIG, CHOLHDL, LDLDIRECT in the last 72 hours. Thyroid function studies No results for input(s): TSH, T4TOTAL, T3FREE, THYROIDAB in the last 72 hours.  Invalid input(s): FREET3 Anemia work up  Recent Labs  12/05/16 1028  RETICCTPCT 10.47*   Microbiology No results found for this or any previous visit (from the past 240 hour(s)).    Studies:  No results found.  Assessment: 76 y.o. man with  extensive cardiac history, including CAD, ischemic cardiomyopathy status post LVAD, on long-term Coumadin (now on Eliquis), presented with multiple episodes of GI bleeding and anemia. He was found to have worening anemia on routine lab test yesterday and came in today for blood transfusion.   1. Anemia secondary to GI bleeding and iron deficiency  2. AF 3. CAD, stable  5.  Ischemic cardiomyopathy s/p LVAD    Plan:  -He will receive 2u blood over 6 hours -The patient will be discharged following completion of transfusion. -He will return next week for IV Feraheme. -I will follow up him in my clinic   This document serves as a record of services personally performed by Truitt Merle, MD. It was created on her behalf by Maryla Morrow, a trained medical scribe. The creation of this record is based on the scribe's personal observations and the provider's statements to them. This document has been checked and approved by the attending provider.  Truitt Merle, MD 12/06/2016  3:25 PM

## 2016-12-09 LAB — TYPE AND SCREEN
ABO/RH(D): O POS
ANTIBODY SCREEN: NEGATIVE
UNIT DIVISION: 0
Unit division: 0

## 2016-12-09 LAB — BPAM RBC
BLOOD PRODUCT EXPIRATION DATE: 201804062359
BLOOD PRODUCT EXPIRATION DATE: 201804062359
ISSUE DATE / TIME: 201803091135
ISSUE DATE / TIME: 201803091444
Unit Type and Rh: 5100
Unit Type and Rh: 5100

## 2016-12-10 ENCOUNTER — Telehealth: Payer: Self-pay | Admitting: Hematology

## 2016-12-10 ENCOUNTER — Ambulatory Visit: Payer: No Typology Code available for payment source

## 2016-12-10 NOTE — Telephone Encounter (Signed)
Patient called to cancel his infusions 3/13 and needs to have it rescheduled

## 2016-12-11 ENCOUNTER — Telehealth: Payer: Self-pay | Admitting: Hematology

## 2016-12-11 NOTE — Telephone Encounter (Signed)
sw pt wife to confirm r/s infusion appt 3/16 at 11 am per LOS

## 2016-12-13 ENCOUNTER — Ambulatory Visit (HOSPITAL_BASED_OUTPATIENT_CLINIC_OR_DEPARTMENT_OTHER): Payer: Non-veteran care

## 2016-12-13 VITALS — BP 94/71 | HR 83 | Temp 97.9°F | Resp 18

## 2016-12-13 DIAGNOSIS — D500 Iron deficiency anemia secondary to blood loss (chronic): Secondary | ICD-10-CM | POA: Diagnosis not present

## 2016-12-13 DIAGNOSIS — Q2730 Arteriovenous malformation, site unspecified: Secondary | ICD-10-CM

## 2016-12-13 DIAGNOSIS — D62 Acute posthemorrhagic anemia: Secondary | ICD-10-CM

## 2016-12-13 MED ORDER — SODIUM CHLORIDE 0.9 % IV SOLN
510.0000 mg | Freq: Once | INTRAVENOUS | Status: AC
  Administered 2016-12-13: 510 mg via INTRAVENOUS
  Filled 2016-12-13: qty 17

## 2016-12-13 NOTE — Patient Instructions (Signed)

## 2016-12-19 ENCOUNTER — Ambulatory Visit (HOSPITAL_COMMUNITY)
Admission: RE | Admit: 2016-12-19 | Discharge: 2016-12-19 | Disposition: A | Discharge status: Home / Self Care | Payer: Non-veteran care | Source: Ambulatory Visit | Attending: Internal Medicine | Admitting: Internal Medicine

## 2016-12-19 ENCOUNTER — Other Ambulatory Visit: Payer: Self-pay | Admitting: Hematology

## 2016-12-19 ENCOUNTER — Other Ambulatory Visit (HOSPITAL_BASED_OUTPATIENT_CLINIC_OR_DEPARTMENT_OTHER): Payer: No Typology Code available for payment source

## 2016-12-19 ENCOUNTER — Ambulatory Visit (HOSPITAL_BASED_OUTPATIENT_CLINIC_OR_DEPARTMENT_OTHER): Payer: Non-veteran care

## 2016-12-19 ENCOUNTER — Encounter (HOSPITAL_COMMUNITY): Payer: Self-pay | Admitting: Unknown Physician Specialty

## 2016-12-19 VITALS — BP 90/74 | HR 74 | Temp 97.0°F | Resp 18

## 2016-12-19 VITALS — BP 88/0 | HR 74 | Ht 73.0 in | Wt 225.2 lb

## 2016-12-19 DIAGNOSIS — I480 Paroxysmal atrial fibrillation: Secondary | ICD-10-CM | POA: Diagnosis not present

## 2016-12-19 DIAGNOSIS — D500 Iron deficiency anemia secondary to blood loss (chronic): Secondary | ICD-10-CM

## 2016-12-19 DIAGNOSIS — K922 Gastrointestinal hemorrhage, unspecified: Secondary | ICD-10-CM

## 2016-12-19 DIAGNOSIS — Z95811 Presence of heart assist device: Secondary | ICD-10-CM | POA: Diagnosis not present

## 2016-12-19 DIAGNOSIS — I5022 Chronic systolic (congestive) heart failure: Secondary | ICD-10-CM | POA: Insufficient documentation

## 2016-12-19 DIAGNOSIS — D62 Acute posthemorrhagic anemia: Secondary | ICD-10-CM

## 2016-12-19 DIAGNOSIS — Q2730 Arteriovenous malformation, site unspecified: Secondary | ICD-10-CM

## 2016-12-19 DIAGNOSIS — D509 Iron deficiency anemia, unspecified: Secondary | ICD-10-CM

## 2016-12-19 DIAGNOSIS — Z7901 Long term (current) use of anticoagulants: Secondary | ICD-10-CM | POA: Diagnosis not present

## 2016-12-19 DIAGNOSIS — I255 Ischemic cardiomyopathy: Secondary | ICD-10-CM

## 2016-12-19 DIAGNOSIS — K8010 Calculus of gallbladder with chronic cholecystitis without obstruction: Secondary | ICD-10-CM

## 2016-12-19 LAB — CBC & DIFF AND RETIC
BASO%: 0.7 % (ref 0.0–2.0)
BASOS ABS: 0.1 10*3/uL (ref 0.0–0.1)
EOS ABS: 0.2 10*3/uL (ref 0.0–0.5)
EOS%: 2.7 % (ref 0.0–7.0)
HEMATOCRIT: 32 % — AB (ref 38.4–49.9)
HEMOGLOBIN: 10.2 g/dL — AB (ref 13.0–17.1)
Immature Retic Fract: 14 % — ABNORMAL HIGH (ref 3.00–10.60)
LYMPH%: 8.3 % — AB (ref 14.0–49.0)
MCH: 30.5 pg (ref 27.2–33.4)
MCHC: 32 g/dL (ref 32.0–36.0)
MCV: 95.3 fL (ref 79.3–98.0)
MONO#: 0.6 10*3/uL (ref 0.1–0.9)
MONO%: 7.1 % (ref 0.0–14.0)
NEUT#: 7.3 10*3/uL — ABNORMAL HIGH (ref 1.5–6.5)
NEUT%: 81.2 % — AB (ref 39.0–75.0)
PLATELETS: 148 10*3/uL (ref 140–400)
RBC: 3.36 10*6/uL — ABNORMAL LOW (ref 4.20–5.82)
RDW: 21.2 % — ABNORMAL HIGH (ref 11.0–14.6)
Retic %: 9.19 % — ABNORMAL HIGH (ref 0.80–1.80)
Retic Ct Abs: 308.78 10*3/uL — ABNORMAL HIGH (ref 34.80–93.90)
WBC: 9 10*3/uL (ref 4.0–10.3)
lymph#: 0.7 10*3/uL — ABNORMAL LOW (ref 0.9–3.3)

## 2016-12-19 LAB — IRON AND TIBC
%SAT: 27 % (ref 20–55)
Iron: 82 ug/dL (ref 42–163)
TIBC: 306 ug/dL (ref 202–409)
UIBC: 224 ug/dL (ref 117–376)

## 2016-12-19 LAB — FERRITIN: FERRITIN: 257 ng/mL (ref 22–316)

## 2016-12-19 MED ORDER — OCTREOTIDE ACETATE 20 MG IM KIT
20.0000 mg | PACK | Freq: Once | INTRAMUSCULAR | Status: AC
  Administered 2016-12-19: 20 mg via INTRAMUSCULAR
  Filled 2016-12-19: qty 1

## 2016-12-19 NOTE — Patient Instructions (Signed)

## 2016-12-19 NOTE — Progress Notes (Signed)
   Patient presents for 2 month follow up in [facility] today. Reports no problems with VAD equipment or concerns with drive line.  Vital Signs:  Doppler Pressure: 88 Automatc BP: 102/61 (68) HR:  74 SPO2: 94 %  Weight: 225.2 lb w/eqt Last weight: 227.8 lb Home weights: 220 lbs Body mass index is 29.71 kg/m.   VAD Indication: Destination Therapy  VAD interrogation & Equipment Management: Speed: 9000 Flow: 7.3 Power: 6.4 w    PI: 3.3  Alarms: no clinical alarms  Events: rare with an outlier on 3/18 w/ 10-12 PI events  Fixed speed: 9000 Low speed limit: 8400  Primary Controller:  Replace back up battery in 25 months. Back up controller:   Replace back up battery in 25 months.  Annual Equipment Maintenance on UBC/PM was performed on 12/21/2015. Will need annual maintenance next visit.   I reviewed the LVAD parameters from today and compared the results to the patient's prior recorded data. LVAD interrogation was NEGATIVE for significant power changes, NEGATIVE for clinical alarms and STABLE for PI events/speed drops. No programming changes were made and pump is functioning within specified parameters. Pt is performing daily controller and system monitor self tests along with completing weekly and monthly maintenance for LVAD equipment.  Provided patient with 10 weekly dressing kits.  LVAD equipment check completed and is in good working order. Back-up equipment present. Charged back up battery and performed self-test on equipment.   Exit Site Care: Drive line is being maintained weekly by wife. Drive line exit site well healed and incorporated. The velour is fully implanted at exit site. Dressing dry and intact. No erythema or drainage. Stabilization device present and accurately applied. Pt denies fever or chills. Pt states they have adequate dressing supplies at home.   Significant Events on VAD Support:  Significant for GIB (most recent 01/2016)  Device:  none  4 year Intermacs follow up completed including:  Quality of Life, KCCQ-12, and Neurocognitive trail making.   Pt refused to do 6 minute walk.   BP & Labs:  MAP 88 - Doppler is reflecting MAP  Hgb 10.2 - No S/S of bleeding. Specifically denies melena/BRBPR or nosebleeds. Pt had 2 units of blood on 12/06/16.  LDH not done today. Pt already had blood work today at [facility]. Per Dr. Haroldine Laws we can check next visit.  Denies tea-colored urine. No power elevations noted on interrogation.   Plan: 1. Will need annual maintenance next visit. 2. Return to clinic in 2 months.   Tanda Rockers RN [place] Coordinator   Office: [phone] 24/7 Emergency VAD Pager: [phone]

## 2016-12-19 NOTE — Progress Notes (Signed)
Patient ID: [name], male   DOB: 11/05/1940, 76 y.o.   MRN: [id number]   VAD CLINIC NOTE   [facility] [place] (Dr. 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 He is s/p LVAD HM II implanted 01/12/13 under DT criteria. In 2016 ICD extraction due to pocket infection.   GI Events   02/2013- Had EGD/Colonoscopy which was unrevealing.  6 mm polyp clipped and removed. 06/2013- Push enteroscopy was negative. Colonoscopy without source of bleeding. Capsule endoscopy--> Showed bleeding from duodenum. EGD--->Bleeding from duodenum with clip applied. 08/2014- Enteroscopy showed bleed from jejunum requiring 3 clips.  11/2015-  Enteroscopy was normal. 11/2015- Capsule 12/11/15 2 proximal small bowel AVMs, 2 nonbleeding polyps which were removed. Coumadin was switched to apixaban 01/2016 - EGD with AVM clipping 01/2016- Acute cholecystitis with cholecystostomy tube placed.  06/2016- Cholecystotomy tube removed   LVAD Clinic f/u:  Returns for follow up with his wife. Overall feeling well. Able to do most activities without too much problem. Denies edema, CP, palpitations, orthopnea or PND. Weight stable. No edema, orthopnea or PND. Cholecystotomy tube has been out. Denies ab pain or any recurrent GB problems. . Remains on Eliquis. No bleeding. Takes lasix 1 - 3 times a week for edema. No problems with pump. No bleeding. [name]   Has seen Dr. Burr Medico for iron infusions. Also getting octreotide.   VAD Indication: Destination Therapy  VAD interrogation & Equipment Management: Speed: 9000 Flow: 7.3 Power: 6.4 w PI: 3.3  Alarms: no clinical alarms Events: rare with an outlier on 3/18 w/ 10-12 PI events  Fixed speed: 9000 Low speed limit: 8400  Primary Controller: Replace back up battery in  28months. Back up controller: Replace back up battery in 40months.    Labs (2/16): LDL 305, HCT 38.9 Labs (3/16): K 3.9, creatinine 1.05, LFTs normal Labs (2/17): WBCs 11.2, INR 2.68.  Labs (6/17): K 3.3, creatinine 0.94, HCT 35.1, INR 2.3   Past Medical History:  Diagnosis Date  . Anemia   . Asbestosis(501)    "6 years in the [place]" (05/24/2013)  . Atrial fibrillation ([place])    permanent  . Cancer ([place])    "scraped some off behind my left ear; fast moving; having it cut out 12/05/2015" (11/07/2015)  . CHF (congestive heart failure) ([place])   . Chronic anticoagulation    Afib and LVAD  . Chronic systolic heart failure ([place])   . 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 Complication of anesthesia    "they can't put me all the way to sleep cause of my heart" (11/07/2015)  . COPD (chronic obstructive pulmonary disease) ([place])   . Coronary artery disease   . Depression   . Epistaxis 11/2015, 07/2014  . History of blood transfusion 08/2015 X 10; 11/2015 X 2   "related to bleeding on the inside somewhere" (11/07/2015)  . Hyperlipidemia   . Hypertension   . Hypothyroidism   . Ischemic cardiomyopathy     CABG 2003, PCI 2007  EF 27%(myoview 2012  . LVAD (left ventricular assist device) present ([place]) 12/2012  . Myocardial infarction 1990's-2000   "2 in  ~ the 1990's; 1 in ~ 2000" (11/07/2015)  . Obesity   . On home oxygen therapy    "  have it available; don't use it" (11/07/2015)  . OSA on CPAP    no longer on cpap  . Paroxysmal ventricular tachycardia ([place])   . Restless leg syndrome   . Shortness of breath dyspnea     Current Outpatient Prescriptions  Medication Sig Dispense Refill  . amiodarone (PACERONE) 200 MG tablet Take 1 tablet (200 mg total) by mouth every evening. (Patient taking differently: Take 200 mg by mouth at bedtime. ) 90 tablet 3  . apixaban (ELIQUIS) 5 MG TABS tablet Take 1 tablet (5 mg total) by mouth 2 (two) times daily. 60 tablet  11  . budesonide-formoterol (SYMBICORT) 160-4.5 MCG/ACT inhaler Inhale 2 puffs into the lungs 2 (two) times daily. (Patient taking differently: Inhale 2 puffs into the lungs daily. ) 1 Inhaler 3  . cholecalciferol (VITAMIN D) 1000 UNITS tablet Take 1 tablet (1,000 Units total) by mouth 2 (two) times daily. 180 tablet 3  . citalopram (CELEXA) 40 MG tablet Take 1 tablet (40 mg total) by mouth at bedtime. 90 tablet 3  . docusate sodium (COLACE) 100 MG capsule Take 200 mg by mouth 2 (two) times daily.     [name] ENSURE (ENSURE) Take 237 mLs by mouth daily.    . furosemide (LASIX) 40 MG tablet Use only if a weight gain of 5 pounds in a week occurs or evident swelling is present (Patient taking differently: Take 40 mg by mouth daily as needed for fluid. Use only if a weight gain of 5 pounds in a week occurs or evident swelling is present) 30 tablet 4  . hydrocortisone (ANUSOL-HC) 2.5 % rectal cream Place 1 application rectally 2 (two) times daily. (Patient taking differently: Place 1 application rectally daily as needed for hemorrhoids or itching. As needed) 30 g 6  . levothyroxine (SYNTHROID, LEVOTHROID) 100 MCG tablet Take 1 tablet (100 mcg total) by mouth at bedtime. 30 tablet 6  . losartan (COZAAR) 25 MG tablet Take 25 mg by mouth at bedtime.    . Multiple Vitamin (MULTIVITAMIN WITH MINERALS) TABS tablet Take 1 tablet by mouth at bedtime.     . nitroGLYCERIN (NITROSTAT) 0.4 MG SL tablet Place 0.4 mg under the tongue every 5 (five) minutes as needed for chest pain. Reported on 04/09/2016    . octreotide (SANDOSTATIN LAR) 20 MG injection Inject 20 mg into the muscle every 28 (twenty-eight) days. 1 each 5  . pantoprazole (PROTONIX) 40 MG tablet Take 1 tablet (40 mg total) by mouth 2 (two) times daily. 180 tablet 3  . potassium chloride SA (K-DUR,KLOR-CON) 20 MEQ tablet Take 2 tablets (40 mEq total) by mouth daily. And take extra 1 pill with lasix when needed. 80 tablet 4  . PRESCRIPTION MEDICATION Ferumoxytol  (FERAHEME) 510 mg in sodium chloride 0.9 % 100 mL IVPB (Once) Route: Intravenous as needed (checked every 2 weeks)    . simvastatin (ZOCOR) 80 MG tablet Take 0.5 tablets (40 mg total) by mouth at bedtime. 45 tablet 3  . tiotropium (SPIRIVA) 18 MCG inhalation capsule Place 1 capsule (18 mcg total) into inhaler and inhale daily. 90 capsule 3  . vitamin B-12 (CYANOCOBALAMIN) 1000 MCG tablet Take 1,000 mcg by mouth at bedtime.    . vitamin C (ASCORBIC ACID) 500 MG tablet Take 1 tablet (500 mg total) by mouth 2 (two) times daily. 180 tablet 3  . albuterol (PROVENTIL) (2.5 MG/3ML) 0.083% nebulizer solution Take 2.5 mg by nebulization every 6 (six) hours as needed for wheezing or shortness of breath.  No current facility-administered medications for this encounter.     Tape  REVIEW OF SYSTEMS: All systems negative except as listed in HPI, PMH and Problem list.   Vital Signs:  Doppler Pressure:88 Automatc BP: 102/61 (68) HR: 74 SPO2: 94 %  Weight: 225.2 lb w/eqt Last weight: 227.8 lb Home weights: 220 lbs Body mass index is 29.71 kg/m.   GENERAL:Well appearing, male; NAD; wife present. Ambulated in the clinic without difficulty.  HEENT: normal  NECK: Supple, JVP flat no bruits.  No lymphadenopathy or thyromegaly appreciated.   CARDIAC: Mechanical heart sounds with LVAD hum present. Pumps sounds are normal LUNGS:  Clear No wheezing ABDOMEN:  Obese, Soft, round, nontender, positive bowel sounds x4. No HSM     LVAD exit site: well-healed and incorporated.  Dressing dry and intact.  No erythema, drainage, odor or tenderness.  Stabilization device present and accurately applied.  Driveline dressing is being changed daily per sterile technique. Changed in clinic Site looks good  EXTREMITIES:  Warm and dry, no cyanosis, clubbing, No edema NEUROLOGIC:  Alert and oriented x 4.  Moves all 4 extremities without trouble.  CN intact. Affect pleasant  ASSESSMENT AND PLAN:  1. Chronic  Systolic HF: s/p LVAD implant 12/2012 for DT.   - Chronic NYHA II-early III  - Volume status looks good.  - Continue prn lasix as needed with dietary indiscretion.  - Reinforced the need and importance of daily weights, a low sodium diet, and fluid restriction (less than 2 L a day). Instructed to call the HF clinic if weight increases more than 3 lbs overnight or 5 lbs in a week.  2. Anticoagulation management: Remains on apixaban so no INR. Marland KitchenHe is not on ASA because of bleeding. He is now getting monthly IM octreotide. Followed by Hematology. Getting iron infusions 3. Anemia/ GIB:  Now on apixaban due to recurrent GI bleeding with normal endoscopy on warfarin. Hgb trending down slightly 11.4-> 10.2. No overt bleeding. Will need to follow closely.  - Followed by hematology for  feraheme and octreotide.    4. LVAD: HM II.       - VAD parameters look good.  Infrequent  PI events 5. PAF: - Mostly chronic now. Tolerates well. Rate controlled.Amiodarone has been stopped  6. Depression: Stable. Continue Celexa 40 mg daily 7. OSA: Using CPAP.  8. Hypoxemia:  Uses home oxygen at night.  This may be due to his baseline COPD. 9. ICD infection - s/p extraction 03/2015.  10. Chronic cholecystitis:   --ERCP with removal of 2 stones and sphincterotomy.  C-tube pulled by Dr. Donne Hazel several months ago. Continues to do well. If recurrent symptoms will need cholecystectomy  11. LLE weakness: Has seen Dr. Ninfa Linden in ortho. Doubt herniated disk. Recommended PT. He is improved today    Glori Bickers MD 12/19/2016

## 2016-12-20 ENCOUNTER — Telehealth: Payer: Self-pay

## 2016-12-20 ENCOUNTER — Telehealth: Payer: Self-pay | Admitting: Hematology

## 2016-12-20 NOTE — Telephone Encounter (Signed)
Wife called requesting lab on 4/5 be changed to 4/12 stating they will be out of town. She states Dr Burr Medico is aware of this.  inbasket sent

## 2016-12-20 NOTE — Telephone Encounter (Signed)
Called and confirmed lab appt time with patients wife. Pateint is aware .

## 2017-01-02 ENCOUNTER — Other Ambulatory Visit: Payer: Medicare Other

## 2017-01-02 ENCOUNTER — Other Ambulatory Visit: Payer: Non-veteran care

## 2017-01-04 ENCOUNTER — Inpatient Hospital Stay (HOSPITAL_COMMUNITY)
Admission: EM | Admit: 2017-01-04 | Discharge: 2017-01-28 | DRG: 314 | Disposition: E | Payer: Non-veteran care | Attending: Internal Medicine | Admitting: Internal Medicine

## 2017-01-04 ENCOUNTER — Emergency Department (HOSPITAL_COMMUNITY): Payer: Non-veteran care

## 2017-01-04 ENCOUNTER — Encounter (HOSPITAL_COMMUNITY): Payer: Self-pay | Admitting: Emergency Medicine

## 2017-01-04 DIAGNOSIS — I482 Chronic atrial fibrillation: Secondary | ICD-10-CM | POA: Diagnosis not present

## 2017-01-04 DIAGNOSIS — I480 Paroxysmal atrial fibrillation: Secondary | ICD-10-CM | POA: Diagnosis present

## 2017-01-04 DIAGNOSIS — J439 Emphysema, unspecified: Secondary | ICD-10-CM | POA: Diagnosis present

## 2017-01-04 DIAGNOSIS — Z9119 Patient's noncompliance with other medical treatment and regimen: Secondary | ICD-10-CM

## 2017-01-04 DIAGNOSIS — Z515 Encounter for palliative care: Secondary | ICD-10-CM | POA: Diagnosis not present

## 2017-01-04 DIAGNOSIS — K7460 Unspecified cirrhosis of liver: Secondary | ICD-10-CM | POA: Diagnosis present

## 2017-01-04 DIAGNOSIS — I472 Ventricular tachycardia: Secondary | ICD-10-CM | POA: Diagnosis not present

## 2017-01-04 DIAGNOSIS — G92 Toxic encephalopathy: Secondary | ICD-10-CM | POA: Diagnosis not present

## 2017-01-04 DIAGNOSIS — R570 Cardiogenic shock: Secondary | ICD-10-CM | POA: Diagnosis not present

## 2017-01-04 DIAGNOSIS — Z01818 Encounter for other preprocedural examination: Secondary | ICD-10-CM

## 2017-01-04 DIAGNOSIS — Z9911 Dependence on respirator [ventilator] status: Secondary | ICD-10-CM

## 2017-01-04 DIAGNOSIS — Z7951 Long term (current) use of inhaled steroids: Secondary | ICD-10-CM

## 2017-01-04 DIAGNOSIS — D62 Acute posthemorrhagic anemia: Secondary | ICD-10-CM | POA: Diagnosis not present

## 2017-01-04 DIAGNOSIS — I509 Heart failure, unspecified: Secondary | ICD-10-CM | POA: Diagnosis not present

## 2017-01-04 DIAGNOSIS — Z95811 Presence of heart assist device: Secondary | ICD-10-CM | POA: Diagnosis not present

## 2017-01-04 DIAGNOSIS — J9601 Acute respiratory failure with hypoxia: Secondary | ICD-10-CM | POA: Diagnosis not present

## 2017-01-04 DIAGNOSIS — Y831 Surgical operation with implant of artificial internal device as the cause of abnormal reaction of the patient, or of later complication, without mention of misadventure at the time of the procedure: Secondary | ICD-10-CM | POA: Diagnosis present

## 2017-01-04 DIAGNOSIS — E874 Mixed disorder of acid-base balance: Secondary | ICD-10-CM | POA: Diagnosis not present

## 2017-01-04 DIAGNOSIS — E039 Hypothyroidism, unspecified: Secondary | ICD-10-CM | POA: Diagnosis present

## 2017-01-04 DIAGNOSIS — T82867A Thrombosis of cardiac prosthetic devices, implants and grafts, initial encounter: Principal | ICD-10-CM | POA: Diagnosis present

## 2017-01-04 DIAGNOSIS — I255 Ischemic cardiomyopathy: Secondary | ICD-10-CM | POA: Diagnosis present

## 2017-01-04 DIAGNOSIS — T829XXA Unspecified complication of cardiac and vascular prosthetic device, implant and graft, initial encounter: Secondary | ICD-10-CM | POA: Diagnosis not present

## 2017-01-04 DIAGNOSIS — J61 Pneumoconiosis due to asbestos and other mineral fibers: Secondary | ICD-10-CM | POA: Diagnosis present

## 2017-01-04 DIAGNOSIS — Z951 Presence of aortocoronary bypass graft: Secondary | ICD-10-CM

## 2017-01-04 DIAGNOSIS — Y92230 Patient room in hospital as the place of occurrence of the external cause: Secondary | ICD-10-CM | POA: Diagnosis not present

## 2017-01-04 DIAGNOSIS — D589 Hereditary hemolytic anemia, unspecified: Secondary | ICD-10-CM | POA: Diagnosis present

## 2017-01-04 DIAGNOSIS — T45615A Adverse effect of thrombolytic drugs, initial encounter: Secondary | ICD-10-CM | POA: Diagnosis not present

## 2017-01-04 DIAGNOSIS — G2581 Restless legs syndrome: Secondary | ICD-10-CM | POA: Diagnosis present

## 2017-01-04 DIAGNOSIS — E872 Acidosis: Secondary | ICD-10-CM | POA: Diagnosis present

## 2017-01-04 DIAGNOSIS — E669 Obesity, unspecified: Secondary | ICD-10-CM | POA: Diagnosis present

## 2017-01-04 DIAGNOSIS — Z6828 Body mass index (BMI) 28.0-28.9, adult: Secondary | ICD-10-CM

## 2017-01-04 DIAGNOSIS — T829XXD Unspecified complication of cardiac and vascular prosthetic device, implant and graft, subsequent encounter: Secondary | ICD-10-CM | POA: Diagnosis not present

## 2017-01-04 DIAGNOSIS — K7200 Acute and subacute hepatic failure without coma: Secondary | ICD-10-CM | POA: Diagnosis present

## 2017-01-04 DIAGNOSIS — I2510 Atherosclerotic heart disease of native coronary artery without angina pectoris: Secondary | ICD-10-CM | POA: Diagnosis present

## 2017-01-04 DIAGNOSIS — K922 Gastrointestinal hemorrhage, unspecified: Secondary | ICD-10-CM | POA: Diagnosis not present

## 2017-01-04 DIAGNOSIS — J9690 Respiratory failure, unspecified, unspecified whether with hypoxia or hypercapnia: Secondary | ICD-10-CM

## 2017-01-04 DIAGNOSIS — Z8249 Family history of ischemic heart disease and other diseases of the circulatory system: Secondary | ICD-10-CM

## 2017-01-04 DIAGNOSIS — I110 Hypertensive heart disease with heart failure: Secondary | ICD-10-CM | POA: Diagnosis present

## 2017-01-04 DIAGNOSIS — I252 Old myocardial infarction: Secondary | ICD-10-CM

## 2017-01-04 DIAGNOSIS — E785 Hyperlipidemia, unspecified: Secondary | ICD-10-CM | POA: Diagnosis present

## 2017-01-04 DIAGNOSIS — Z7901 Long term (current) use of anticoagulants: Secondary | ICD-10-CM

## 2017-01-04 DIAGNOSIS — I4891 Unspecified atrial fibrillation: Secondary | ICD-10-CM | POA: Diagnosis present

## 2017-01-04 DIAGNOSIS — Z4659 Encounter for fitting and adjustment of other gastrointestinal appliance and device: Secondary | ICD-10-CM

## 2017-01-04 DIAGNOSIS — I5023 Acute on chronic systolic (congestive) heart failure: Secondary | ICD-10-CM | POA: Diagnosis present

## 2017-01-04 DIAGNOSIS — E875 Hyperkalemia: Secondary | ICD-10-CM | POA: Diagnosis not present

## 2017-01-04 DIAGNOSIS — R34 Anuria and oliguria: Secondary | ICD-10-CM | POA: Diagnosis present

## 2017-01-04 DIAGNOSIS — G4733 Obstructive sleep apnea (adult) (pediatric): Secondary | ICD-10-CM | POA: Diagnosis present

## 2017-01-04 DIAGNOSIS — Z87891 Personal history of nicotine dependence: Secondary | ICD-10-CM

## 2017-01-04 DIAGNOSIS — T404X5A Adverse effect of other synthetic narcotics, initial encounter: Secondary | ICD-10-CM | POA: Diagnosis not present

## 2017-01-04 DIAGNOSIS — N170 Acute kidney failure with tubular necrosis: Secondary | ICD-10-CM | POA: Diagnosis present

## 2017-01-04 DIAGNOSIS — E876 Hypokalemia: Secondary | ICD-10-CM | POA: Diagnosis not present

## 2017-01-04 DIAGNOSIS — F329 Major depressive disorder, single episode, unspecified: Secondary | ICD-10-CM | POA: Diagnosis present

## 2017-01-04 DIAGNOSIS — N179 Acute kidney failure, unspecified: Secondary | ICD-10-CM | POA: Diagnosis not present

## 2017-01-04 LAB — COMPREHENSIVE METABOLIC PANEL
ALK PHOS: 50 U/L (ref 38–126)
ALT: 20 U/L (ref 17–63)
ANION GAP: 13 (ref 5–15)
AST: 128 U/L — ABNORMAL HIGH (ref 15–41)
Albumin: 3.4 g/dL — ABNORMAL LOW (ref 3.5–5.0)
BILIRUBIN TOTAL: 3 mg/dL — AB (ref 0.3–1.2)
BUN: 26 mg/dL — ABNORMAL HIGH (ref 6–20)
CALCIUM: 9.4 mg/dL (ref 8.9–10.3)
CO2: 19 mmol/L — ABNORMAL LOW (ref 22–32)
Chloride: 107 mmol/L (ref 101–111)
Creatinine, Ser: 1.42 mg/dL — ABNORMAL HIGH (ref 0.61–1.24)
GFR, EST AFRICAN AMERICAN: 54 mL/min — AB (ref >60)
GFR, EST NON AFRICAN AMERICAN: 47 mL/min — AB (ref >60)
Glucose, Bld: 143 mg/dL — ABNORMAL HIGH (ref 65–99)
POTASSIUM: 4.9 mmol/L (ref 3.5–5.1)
Sodium: 139 mmol/L (ref 135–145)
TOTAL PROTEIN: 6.7 g/dL (ref 6.5–8.1)

## 2017-01-04 LAB — I-STAT CHEM 8, ED
BUN: 31 mg/dL — AB (ref 6–20)
Calcium, Ion: 1.14 mmol/L — ABNORMAL LOW (ref 1.15–1.40)
Chloride: 107 mmol/L (ref 101–111)
Creatinine, Ser: 1.4 mg/dL — ABNORMAL HIGH (ref 0.61–1.24)
Glucose, Bld: 148 mg/dL — ABNORMAL HIGH (ref 65–99)
HCT: 32 % — ABNORMAL LOW (ref 39.0–52.0)
HEMOGLOBIN: 10.9 g/dL — AB (ref 13.0–17.0)
Potassium: 4.8 mmol/L (ref 3.5–5.1)
SODIUM: 140 mmol/L (ref 135–145)
TCO2: 22 mmol/L (ref 0–100)

## 2017-01-04 LAB — CBC WITH DIFFERENTIAL/PLATELET
Basophils Absolute: 0 10*3/uL (ref 0.0–0.1)
Basophils Relative: 0 %
Eosinophils Absolute: 0.1 10*3/uL (ref 0.0–0.7)
Eosinophils Relative: 1 %
HCT: 34.1 % — ABNORMAL LOW (ref 39.0–52.0)
HEMOGLOBIN: 9.8 g/dL — AB (ref 13.0–17.0)
LYMPHS ABS: 0.4 10*3/uL — AB (ref 0.7–4.0)
Lymphocytes Relative: 4 %
MCH: 28.8 pg (ref 26.0–34.0)
MCHC: 28.7 g/dL — AB (ref 30.0–36.0)
MCV: 100.3 fL — ABNORMAL HIGH (ref 78.0–100.0)
MONO ABS: 0.9 10*3/uL (ref 0.1–1.0)
MONOS PCT: 9 %
NEUTROS ABS: 9.1 10*3/uL — AB (ref 1.7–7.7)
NEUTROS PCT: 86 %
Platelets: 154 10*3/uL (ref 150–400)
RBC: 3.4 MIL/uL — ABNORMAL LOW (ref 4.22–5.81)
RDW: 18.9 % — AB (ref 11.5–15.5)
WBC: 10.5 10*3/uL (ref 4.0–10.5)

## 2017-01-04 LAB — BRAIN NATRIURETIC PEPTIDE: B Natriuretic Peptide: 346.1 pg/mL — ABNORMAL HIGH (ref 0.0–100.0)

## 2017-01-04 LAB — I-STAT TROPONIN, ED: TROPONIN I, POC: 0.04 ng/mL (ref 0.00–0.08)

## 2017-01-04 LAB — LACTATE DEHYDROGENASE: LDH: 1370 U/L — ABNORMAL HIGH (ref 98–192)

## 2017-01-04 MED ORDER — CITALOPRAM HYDROBROMIDE 20 MG PO TABS
40.0000 mg | ORAL_TABLET | Freq: Every day | ORAL | Status: DC
  Administered 2017-01-04: 40 mg via ORAL
  Filled 2017-01-04: qty 4
  Filled 2017-01-04: qty 2

## 2017-01-04 MED ORDER — ATORVASTATIN CALCIUM 40 MG PO TABS: 40.0000 mg | ORAL_TABLET | Freq: Every day | ORAL | Status: DC

## 2017-01-04 MED ORDER — FUROSEMIDE 10 MG/ML IJ SOLN
80.0000 mg | Freq: Once | INTRAMUSCULAR | Status: AC
  Administered 2017-01-04: 80 mg via INTRAVENOUS
  Filled 2017-01-04: qty 8

## 2017-01-04 MED ORDER — LEVOTHYROXINE SODIUM 100 MCG PO TABS
100.0000 ug | ORAL_TABLET | Freq: Every day | ORAL | Status: DC
  Administered 2017-01-04: 100 ug via ORAL
  Filled 2017-01-04: qty 1

## 2017-01-04 MED ORDER — NOREPINEPHRINE BITARTRATE 1 MG/ML IV SOLN
0.0000 ug/min | INTRAVENOUS | Status: DC
  Administered 2017-01-04: 3 ug/min via INTRAVENOUS
  Administered 2017-01-05: 8 ug/min via INTRAVENOUS
  Filled 2017-01-04 (×3): qty 4

## 2017-01-04 MED ORDER — ADULT MULTIVITAMIN W/MINERALS CH
1.0000 | ORAL_TABLET | Freq: Every day | ORAL | Status: DC
  Administered 2017-01-04: 1 via ORAL
  Filled 2017-01-04: qty 1

## 2017-01-04 MED ORDER — DOCUSATE SODIUM 100 MG PO CAPS: 200.0000 mg | ORAL_CAPSULE | Freq: Two times a day (BID) | ORAL | Status: DC

## 2017-01-04 MED ORDER — TIOTROPIUM BROMIDE MONOHYDRATE 18 MCG IN CAPS
18.0000 ug | ORAL_CAPSULE | Freq: Every day | RESPIRATORY_TRACT | Status: DC
  Administered 2017-01-05: 18 ug via RESPIRATORY_TRACT
  Filled 2017-01-04: qty 5

## 2017-01-04 MED ORDER — PANTOPRAZOLE SODIUM 40 MG PO TBEC
40.0000 mg | DELAYED_RELEASE_TABLET | Freq: Two times a day (BID) | ORAL | Status: DC
  Administered 2017-01-04: 40 mg via ORAL
  Filled 2017-01-04: qty 1

## 2017-01-04 MED ORDER — ACETAMINOPHEN 325 MG PO TABS: 650.0000 mg | ORAL_TABLET | ORAL | Status: DC | PRN

## 2017-01-04 MED ORDER — MOMETASONE FURO-FORMOTEROL FUM 200-5 MCG/ACT IN AERO
2.0000 | INHALATION_SPRAY | Freq: Two times a day (BID) | RESPIRATORY_TRACT | Status: DC
  Administered 2017-01-05: 2 via RESPIRATORY_TRACT
  Filled 2017-01-04: qty 8.8

## 2017-01-04 MED ORDER — APIXABAN 5 MG PO TABS: 5.0000 mg | ORAL_TABLET | Freq: Two times a day (BID) | ORAL | Status: DC

## 2017-01-04 MED ORDER — BIVALIRUDIN 250 MG IV SOLR
0.1000 mg/kg/h | INTRAVENOUS | Status: DC
  Administered 2017-01-04: 0.1 mg/kg/h via INTRAVENOUS
  Filled 2017-01-04: qty 250

## 2017-01-04 MED ORDER — ONDANSETRON HCL 4 MG/2ML IJ SOLN
4.0000 mg | Freq: Four times a day (QID) | INTRAMUSCULAR | Status: DC | PRN
  Administered 2017-01-04 – 2017-01-05 (×3): 4 mg via INTRAVENOUS
  Filled 2017-01-04 (×4): qty 2

## 2017-01-04 MED ORDER — ALBUTEROL SULFATE (2.5 MG/3ML) 0.083% IN NEBU: 2.5000 mg | INHALATION_SOLUTION | Freq: Four times a day (QID) | RESPIRATORY_TRACT | Status: DC | PRN

## 2017-01-04 MED ORDER — LOSARTAN POTASSIUM 25 MG PO TABS: 25.0000 mg | ORAL_TABLET | Freq: Every day | ORAL | Status: DC

## 2017-01-04 NOTE — ED Triage Notes (Signed)
Pt week SOB for the past week states he is anemic and needs blood often, 2nd generation LVAD. Pt has a-fib, 108/76, 84HR, O2 100%

## 2017-01-04 NOTE — ED Notes (Signed)
Attempted report x1. 

## 2017-01-04 NOTE — ED Notes (Signed)
Pt sts he started to feel nauseous after receiving the lasix and he has never felt sick after getting it before. Pt has not vomited but was given an emesis bag.

## 2017-01-04 NOTE — ED Notes (Signed)
Rapid response in emergency, paged LVAD team

## 2017-01-04 NOTE — H&P (Addendum)
VAD TEAM History & Physical Note   Reason for Admission: Dyspnea. 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 He is s/p LVAD HM II implanted 01/12/13 under DT criteria. In 2016 ICD extraction due to pocket infection.   His VAD course has been complicated by recurrent anemia but he has otherwise done fairly well. He has had multiple endoscopies as listed below. Coumadin has been switched to Eliquis with some improvement. Has followed with Dr. Burr Medico in Hematology for periodic iron infusions and RBC transfusions (last transfusion 12/06/16). Has also been getting octreotide.  [place] and was feeling pretty good. He ate lot of food and did not take any lasix (has not needed any for 6 months). Over last day or town noted more dyspnea on exertion. Weight up about 5 pounds.  Today developed severe SOB and wife called EMS.  In ER was pale and concern was initially for recurrent anemia but hgb came back at 9.8 CXR obtained showed mild pulmonary edema but no infiltrate (reviewed personally). Denies fevers, chills, productive cough or CP. SOB with any movement. Has not missed Eliquis. Denies bleeding.   GI Events   02/2013- Had EGD/Colonoscopy which was unrevealing.  6 mm polyp clipped and removed. 06/2013- Push enteroscopy was negative. Colonoscopy without source of bleeding. Capsule endoscopy--> Showed bleeding from duodenum. EGD--->Bleeding from duodenum with clip applied. 08/2014- Enteroscopy showed bleed from jejunum requiring 3 clips.  11/2015-  Enteroscopy was normal. 11/2015- Capsule 12/11/15 2 proximal small bowel AVMs, 2 nonbleeding polyps which were removed. Coumadin was switched to apixaban 01/2016 - EGD with AVM clipping 01/2016- Acute cholecystitis with cholecystostomy tube placed.    06/2016- Cholecystotomy tube removed    LVAD INTERROGATION:  HeartMate II LVAD:  Flow 3.2 (occasional --)  liters/min, speed 9000, power 4.3, PI 2.8.  No PI events.    Review of Systems: [y] = yes, _0  = no   General: Weight gain _1 ; Weight loss _2 ; Anorexia _3 ; Fatigue Blue.Reese ]; Fever _4 ; Chills _5 ; Weakness _6   Cardiac: Chest pain/pressure _7 ; Resting SOB Blue.Reese ]; Exertional SOB Blue.Reese ]; Orthopnea Blue.Reese ]; Pedal Edema Blue.Reese ]; Palpitations _8 ; Syncope _9 ; Presyncope _10 ; Paroxysmal nocturnal dyspnea_11   Pulmonary: Cough _12 ; Wheezing_13 ; Hemoptysis_14 ; Sputum _15 ; Snoring _16   GI: Vomiting_17 ; Dysphagia_18 ; Melena_19 ; Hematochezia _20 ; Heartburn_21 ; Abdominal pain _22 ; Constipation _23 ; Diarrhea _24 ; BRBPR _25   GU: Hematuria_26 ; Dysuria _27 ; Nocturia_28   Vascular: Pain in legs with walking _29 ; Pain in feet with lying flat _30 ; Non-healing sores _31 ; Stroke _32 ; TIA _33 ; Slurred speech _34 ;  Neuro: Headaches_35 ; Vertigo_36 ; Seizures_37 ; Paresthesias_38 ;Blurred vision _39 ; Diplopia _40 ; Vision changes _41   Ortho/Skin: Arthritis [ y]; Joint pain Blue.Reese ]; Muscle pain _42 ; Joint swelling _43 ; Back Pain _44 ; Rash _45   Psych: Depression_46 ; Anxiety_47   Heme: Bleeding problems Blue.Reese ]; Clotting disorders _48 ; Anemia Blue.Reese ]  Endocrine: Diabetes _49 ; Thyroid dysfunction_50   Home Medications Prior to Admission medications   Medication Sig Start Date End Date  Taking? Authorizing Provider  albuterol (PROVENTIL) (2.5 MG/3ML) 0.083% nebulizer solution Take 2.5 mg by nebulization every 6 (six) hours as needed for wheezing or shortness of breath.    Historical Provider, MD  amiodarone (PACERONE) 200 MG tablet Take 1 tablet (200 mg total) by mouth every evening. Patient taking differently: Take 200 mg by mouth at bedtime.  03/29/16   Larey Dresser, MD  apixaban (ELIQUIS) 5 MG TABS tablet Take 1 tablet (5 mg total) by mouth 2 (two) times daily. 12/25/15   Jolaine Artist, MD  budesonide-formoterol (SYMBICORT)  160-4.5 MCG/ACT inhaler Inhale 2 puffs into the lungs 2 (two) times daily. Patient taking differently: Inhale 2 puffs into the lungs daily.  01/10/15   Jolaine Artist, MD  cholecalciferol (VITAMIN D) 1000 UNITS tablet Take 1 tablet (1,000 Units total) by mouth 2 (two) times daily. 01/10/15   Jolaine Artist, MD  citalopram (CELEXA) 40 MG tablet Take 1 tablet (40 mg total) by mouth at bedtime. 01/10/15   Jolaine Artist, MD  docusate sodium (COLACE) 100 MG capsule Take 200 mg by mouth 2 (two) times daily.     Historical Provider, MD  ENSURE (ENSURE) Take 237 mLs by mouth daily.    Historical Provider, MD  furosemide (LASIX) 40 MG tablet Use only if a weight gain of 5 pounds in a week occurs or evident swelling is present Patient taking differently: Take 40 mg by mouth daily as needed for fluid. Use only if a weight gain of 5 pounds in a week occurs or evident swelling is present 03/29/16   Larey Dresser, MD  hydrocortisone (ANUSOL-HC) 2.5 % rectal cream Place 1 application rectally 2 (two) times daily. Patient taking differently: Place 1 application rectally daily as needed for hemorrhoids or itching. As needed 11/21/15   Larey Dresser, MD  levothyroxine (SYNTHROID, LEVOTHROID) 100 MCG tablet Take 1 tablet (100 mcg total) by mouth at bedtime. 02/23/16   Amy D Clegg, NP  losartan (COZAAR) 25 MG tablet Take 25 mg by mouth at bedtime.    Historical Provider, MD  Multiple Vitamin (MULTIVITAMIN WITH MINERALS) TABS tablet Take 1 tablet by mouth at bedtime.     Historical Provider, MD  nitroGLYCERIN (NITROSTAT) 0.4 MG SL tablet Place 0.4 mg under the tongue every 5 (five) minutes as needed for chest pain. Reported on 04/09/2016    Historical Provider, MD  octreotide (SANDOSTATIN LAR) 20 MG injection Inject 20 mg into the muscle every 28 (twenty-eight) days. 08/29/16 01/17/17  Jolaine Artist, MD  pantoprazole (PROTONIX) 40 MG tablet Take 1 tablet (40 mg total) by mouth 2 (two) times daily. 01/10/15    Jolaine Artist, MD  potassium chloride SA (K-DUR,KLOR-CON) 20 MEQ tablet Take 2 tablets (40 mEq total) by mouth daily. And take extra 1 pill with lasix when needed. 04/30/16   Amy D Ninfa Meeker, NP  PRESCRIPTION MEDICATION Ferumoxytol (FERAHEME) 510 mg in sodium chloride 0.9 % 100 mL IVPB (Once) Route: Intravenous as needed (checked every 2 weeks)    Historical Provider, MD  simvastatin (ZOCOR) 80 MG tablet Take 0.5 tablets (40 mg total) by mouth at bedtime. 01/10/15   Jolaine Artist, MD  tiotropium (SPIRIVA) 18 MCG inhalation capsule Place 1 capsule (18 mcg total) into inhaler and inhale daily. 01/10/15   Jolaine Artist, MD  vitamin B-12 (CYANOCOBALAMIN) 1000 MCG tablet Take 1,000 mcg by mouth at bedtime.    Historical Provider, MD  vitamin C (ASCORBIC ACID) 500 MG  tablet Take 1 tablet (500 mg total) by mouth 2 (two) times daily. 01/10/15   Jolaine Artist, MD    Past Medical History: Past Medical History:  Diagnosis Date  . Anemia   . Asbestosis(501)    "6 years in the [place]" (05/24/2013)  . Atrial fibrillation ([place])    permanent  . Cancer ([place])    "scraped some off behind my left ear; fast moving; having it cut out 12/05/2015" (11/07/2015)  . CHF (congestive heart failure) ([place])   . Chronic anticoagulation    Afib and LVAD  . Chronic systolic heart failure ([place])   . 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 Complication of anesthesia    "they can't put me all the way to sleep cause of my heart" (11/07/2015)  . COPD (chronic obstructive pulmonary disease) ([place])   . Coronary artery disease   . Depression   . Epistaxis 11/2015, 07/2014  . History of blood transfusion 08/2015 X 10; 11/2015 X 2   "related to bleeding on the inside somewhere" (11/07/2015)  . Hyperlipidemia   . Hypertension   . Hypothyroidism   . Ischemic cardiomyopathy     CABG 2003, PCI 2007  EF 27%(myoview 2012  . LVAD (left ventricular assist device) present ([place]) 12/2012  .  Myocardial infarction 1990's-2000   "2 in  ~ the 1990's; 1 in ~ 2000" (11/07/2015)  . Obesity   . On home oxygen therapy    "have it available; don't use it" (11/07/2015)  . OSA on CPAP    no longer on cpap  . Paroxysmal ventricular tachycardia ([place])   . Restless leg syndrome   . Shortness of breath dyspnea     Past Surgical History: Past Surgical History:  Procedure Laterality Date  . APPENDECTOMY    . CARDIAC DEFIBRILLATOR PLACEMENT  2004; ~ 2010   "cut it out in 2016 after it got infected"   . COLONOSCOPY N/A 03/09/2014   Procedure: COLONOSCOPY;  Surgeon: Inda Castle, MD;  Location: [place];  Service: Endoscopy;  Laterality: N/A;  LVAD  patient  . COLONOSCOPY N/A 07/29/2014   Procedure: COLONOSCOPY;  Surgeon: Gatha Mayer, MD;  Location: [place];  Service: Endoscopy;  Laterality: N/A;  . CORONARY ARTERY BYPASS GRAFT  2003   "?X4" (05/24/2013)  . ENTEROSCOPY N/A 07/27/2014   Procedure: ENTEROSCOPY;  Surgeon: Gatha Mayer, MD;  Location: [facility] ENDOSCOPY;  Service: Endoscopy;  Laterality: N/A;  LVAD patient   . ENTEROSCOPY N/A 09/27/2014   Procedure: ENTEROSCOPY;  Surgeon: Gatha Mayer, MD;  Location: [facility] ENDOSCOPY;  Service: Endoscopy;  Laterality: N/A;  . ENTEROSCOPY N/A 11/09/2015   Procedure: ENTEROSCOPY;  Surgeon: Mauri Pole, MD;  Location: [place] ENDOSCOPY;  Service: Endoscopy;  Laterality: N/A;  LVAD  . ENTEROSCOPY N/A 12/13/2015   Procedure: ENTEROSCOPY;  Surgeon: Milus Banister, MD;  Location: [place];  Service: Endoscopy;  Laterality: N/A;  . ENTEROSCOPY N/A 02/21/2016   Procedure: ENTEROSCOPY;  Surgeon: Milus Banister, MD;  Location: [place];  Service: Endoscopy;  Laterality: N/A;  . ERCP N/A 06/13/2016   Procedure: ENDOSCOPIC RETROGRADE CHOLANGIOPANCREATOGRAPHY (ERCP);  Surgeon: Milus Banister, MD;  Location: [place];  Service: Endoscopy;  Laterality: N/A;  . ESOPHAGOGASTRODUODENOSCOPY N/A 03/09/2014   Procedure: ESOPHAGOGASTRODUODENOSCOPY  (EGD);  Surgeon: Inda Castle, MD;  Location: [place];  Service: Endoscopy;  Laterality: N/A;  . ESOPHAGOGASTRODUODENOSCOPY N/A 08/01/2014   Procedure:  ESOPHAGOGASTRODUODENOSCOPY (EGD);  Surgeon: Jerene Bears, MD;  Location[facility] ENDOSCOPY;  Service: Endoscopy;  Laterality: N/A;  LVAD patient  . GIVENS CAPSULE STUDY N/A 03/10/2014   Procedure: GIVENS CAPSULE STUDY;  Surgeon: Inda Castle, MD;  Location: [place];  Service: Endoscopy;  Laterality: N/A;  . GIVENS CAPSULE STUDY N/A 07/30/2014   Procedure: GIVENS CAPSULE STUDY;  Surgeon: Gatha Mayer, MD;  Location: [place];  Service: Endoscopy;  Laterality: N/A;  . GIVENS CAPSULE STUDY N/A 12/11/2015   Procedure: GIVENS CAPSULE STUDY;  Surgeon: Gatha Mayer, MD;  Location: [place];  Service: Endoscopy;  Laterality: N/A;  . ICD LEAD REMOVAL Left 03/16/2015   Procedure: ICD LEAD REMOVAL/EXTRACTION;  Surgeon: Evans Lance, MD;  Location: [place];  Service: Cardiovascular;  Laterality: Left;  PATIENT HAS LVAD  DR. VAN TRIGT TO BACK UP EXTRACTION  . INSERTION OF IMPLANTABLE LEFT VENTRICULAR ASSIST DEVICE N/A 01/12/2013   Procedure: INSERTION OF IMPLANTABLE LEFT VENTRICULAR ASSIST DEVICE;  Surgeon: Ivin Poot, MD;  Location: [place];  Service: Open Heart Surgery;  Laterality: N/A;   nitric oxide; Redo sternotomy  . INTRAOPERATIVE TRANSESOPHAGEAL ECHOCARDIOGRAM N/A 01/12/2013   Procedure: INTRAOPERATIVE TRANSESOPHAGEAL ECHOCARDIOGRAM;  Surgeon: Ivin Poot, MD;  Location: [place];  Service: Open Heart Surgery;  Laterality: N/A;  . IR GENERIC HISTORICAL  07/23/2016   IR SINUS/FIST TUBE CHK-NON GI 07/23/2016 Arne Cleveland, MD [facility]INTERV RAD  . IR GENERIC HISTORICAL  05/14/2016   IR RADIOLOGIST EVAL & MGMT 05/14/2016 [facility] INTERV RAD  . LEFT AND RIGHT HEART CATHETERIZATION WITH CORONARY/GRAFT ANGIOGRAM  01/07/2013   Procedure: LEFT AND RIGHT HEART CATHETERIZATION WITH Beatrix Fetters;  Surgeon: Jolaine Artist, MD;   Location: [facility] CATH LAB;  Service: Cardiovascular;;    Family History: Family History  Problem Relation Age of Onset  . Heart attack Mother   . Heart attack Father     Social History: Social History   Social History  . Marital status: Married    Spouse name: N/A  . Number of children: N/A  . Years of education: N/A   Social History Main Topics  . Smoking status: Former Smoker    Packs/day: 2.00    Years: 45.00    Types: Cigarettes    Quit date: 11/11/2001  . Smokeless tobacco: Never Used  . Alcohol use No     Comment: 05/24/2013 "use to drink beer; hardly nothing since 2003; nothing at all since 12/2011 when I got my LVAD  "  . Drug use: No  . Sexual activity: No   Other Topics Concern  . None   Social History Narrative  . None    Allergies:  Allergies  Allergen Reactions  . Tape Other (See Comments)    Tape will tear with skin    Objective:    Vital Signs:   Temp:  [98 F (36.7 C)] 98 F (36.7 C) (04/07 1826) Pulse Rate:  [82-89] 89 (04/07 1826) Resp:  [22-25] 22 (04/07 1826) SpO2:  [100 %] 100 % (04/07 1826) Weight:  [102.1 kg (225 lb)] 102.1 kg (225 lb) (04/07 1820)   Filed Weights   01/02/2017 1820  Weight: 102.1 kg (225 lb)    Mean arterial Pressure 70s  Physical Exam: General: Fatigued appearing. Pale and tachypneic HEENT: normal conjunctiva pale Neck: supple. JVP 10 . Carotids 2+ bilat; no bruits. No lymphadenopathy or thryomegaly appreciated. Cor: Mechanical heart sounds with LVAD hum present. Lungs: crackles at bases Abdomen: soft, nontender, nondistended.  No hepatosplenomegaly. No bruits or masses. Good bowel sounds. Driveline: C/D/I; securement device intact and driveline incorporated Extremities: no cyanosis, clubbing, rash, 1+ edema cool and pale. Good cap refill  Neuro: alert & orientedx3, cranial nerves grossly intact. moves all 4 extremities w/o difficulty. Affect pleasant  Telemetry: AF 100-110 Personally reviewed   Labs: Basic  Metabolic Panel: No results for input(s): NA, K, CL, CO2, GLUCOSE, BUN, CREATININE, CALCIUM, MG, PHOS in the last 168 hours.  Liver Function Tests: No results for input(s): AST, ALT, ALKPHOS, BILITOT, PROT, ALBUMIN in the last 168 hours. No results for input(s): LIPASE, AMYLASE in the last 168 hours. No results for input(s): AMMONIA in the last 168 hours.  CBC: No results for input(s): WBC, NEUTROABS, HGB, HCT, MCV, PLT in the last 168 hours.  Cardiac Enzymes: No results for input(s): CKTOTAL, CKMB, CKMBINDEX, TROPONINI in the last 168 hours.  BNP: BNP (last 3 results) No results for input(s): BNP in the last 8760 hours.  ProBNP (last 3 results) No results for input(s): PROBNP in the last 8760 hours.   CBG: No results for input(s): GLUCAP in the last 168 hours.  Coagulation Studies: No results for input(s): LABPROT, INR in the last 72 hours.  Other results: EKG: Atrial fib 98 bpm No acute ST abnormalities Personally reviewed   Imaging: Dg Chest Port 1 View  Result Date: 01/24/2017 CLINICAL DATA:  Shortness of breath for 1 week. Ischemic cardiomyopathy. COPD. Left ventricular assist device. EXAM: PORTABLE CHEST 1 VIEW COMPARISON:  02/26/2016 FINDINGS: Cardiomegaly stable. Left ventricular assist device is again seen, as well as prior CABG. Pulmonary hyperinflation again noted. Increased pulmonary interstitial prominence is suspicious for mild interstitial edema. No evidence of pulmonary consolidation or pleural effusion. IMPRESSION: Increased pulmonary interstitial prominence, suspicious for mild interstitial edema. Stable cardiomegaly with left ventricular assist device. Electronically Signed   By: Earle Gell M.D.   On: 01/09/2017 19:02         Assessment:   1. Acute on chronic systolic heart failure s/p LVAD    --HMII implanted 01/12/13    --Echo 6/17 LVEF 45-50% RV moderately decreased 2. Acute respiratory failure due to #1 3. Chronic atrial fibrillation 4. CAD s/p  CABG 2003 5. h/o symptomatic anemia with multiple GI bleeds    --Followed in hematology clinic for IV iron and blood transfusions as needed. Last transfusion 12/06/16.     --Hgb 10/9 today 6. AKI    --creatinine 1.2->1.4   Plan/Discussion:    Suspect the majority of his symptoms are related to pulmonary edema in the setting of dietary indiscretion but his symptoms and pump parameters seems out of proportion to his CXR. With low flows on his pump I do have some concern for pump dysfunction vs RV failure. Pump sounds ok on auscultation. Amiodarone toxicity also a possibility but unlikely to result in change in VAD parameters.   Will admit. Start IV diuresis. Plan echo in am. Check LDH and ESR. Watch hgb. If not responding to IV lasix will place PICC to assess CVP and co-ox. May need full RHC.    I reviewed the LVAD parameters from today, and compared the results to the patient's prior recorded data.  No programming changes were made.  The LVAD is functioning within specified parameters.  The patient performs LVAD self-test daily.  LVAD interrogation was negative for any significant power changes, alarms or PI events/speed drops.  LVAD equipment check completed and is in good working order.  Back-up equipment present.  LVAD education done on emergency procedures and precautions and reviewed exit site care.  Length of Stay: 0  Glori Bickers MD 01/24/2017, 7:32 PM  VAD Team Pager [phone] (7am - 7am) +++VAD ISSUES ONLY+++ Advanced Heart Failure Team Pager [phone] (M-F; 7a - 4p)  Please contact [facility] for night-coverage after hours (4p -7a ) and weekends on [email or web address] for all non- LVAD Issues  Addendum:  He has had very sluggish response to IV lasix. With low PI and flows on VAD will place PICC line to check co-ox. Suspect he may need inotropes for RV support.   Glori Bickers, MD  9:17 PM

## 2017-01-04 NOTE — Progress Notes (Signed)
ANTICOAGULATION CONSULT NOTE - Initial Consult  Pharmacy Consult for bivalirudin Indication: LVAD  Allergies  Allergen Reactions  . Tape Other (See Comments)    Tape will tear with skin    Patient Measurements: Height: 6\' 1"  (185.4 cm) Weight: 225 lb (102.1 kg) IBW/kg (Calculated) : 79.9  Vital Signs: Temp: 98 F (36.7 C) (04/07 1826) Temp Source: Oral (04/07 1826) BP: 80/0 (04/07 2150) Pulse Rate: 102 (04/07 2150)  Labs:  Recent Labs  01/02/2017 1913 12/30/2016 1932  HGB 9.8* 10.9*  HCT 34.1* 32.0*  PLT 154  --   CREATININE 1.42* 1.40*    Estimated Creatinine Clearance: 57.3 mL/min (A) (by C-G formula based on SCr of 1.4 mg/dL (H)).  Assessment: 51 YOM with LVAD here with suspected pulmonary edema. On Eliquis PTA, last dose taken this morning at ~0800. To transition to bivalirudin. hgb 9.8, plts 154 - no bleeding noted.  Goal of Therapy:  aPTT 50-85 seconds Monitor platelets by anticoagulation protocol: Yes   Plan:  -stat aPTT for baseline value -start bivalirudin at 0.1mg /kg/hr -aPTT q2 hours until therapeutic x2 -daily CBC  Kristine Chahal D. Chantae Soo, PharmD, BCPS Clinical Pharmacist Pager: [phone] 12/31/2016 10:35 PM

## 2017-01-04 NOTE — ED Provider Notes (Signed)
[facility] DEPT Provider Note   CSN: [id number] Arrival date & time: 01/01/2017  1812     History   Chief Complaint Chief Complaint  Patient presents with  . Shortness of Breath/LVAD    HPI [name] is a 76 y.o. male.  HPI Patient presents with 2 weeks of shortness of breath. Has progressed today. Having difficulty with any exertion. Shortness of breath. Denies orthopnea. No chest pain, cough, fever or chills. No new lower extremity swelling or pain. States he had similar symptoms last month. Hard blood transfusion. Denies melanotic or grossly bloody stools. Past Medical History:  Diagnosis Date  . Anemia   . Asbestosis(501)    "6 years in the [place]" (05/24/2013)  . Atrial fibrillation ([place])    permanent  . Cancer ([place])    "scraped some off behind my left ear; fast moving; having it cut out 12/05/2015" (11/07/2015)  . CHF (congestive heart failure) ([place])   . Chronic anticoagulation    Afib and LVAD  . Chronic systolic heart failure ([place])   . 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 Complication of anesthesia    "they can't put me all the way to sleep cause of my heart" (11/07/2015)  . COPD (chronic obstructive pulmonary disease) ([place])   . Coronary artery disease   . Depression   . Epistaxis 11/2015, 07/2014  . History of blood transfusion 08/2015 X 10; 11/2015 X 2   "related to bleeding on the inside somewhere" (11/07/2015)  . Hyperlipidemia   . Hypertension   . Hypothyroidism   . Ischemic cardiomyopathy     CABG 2003, PCI 2007  EF 27%(myoview 2012  . LVAD (left ventricular assist device) present ([place]) 12/2012  . Myocardial infarction 1990's-2000   "2 in  ~ the 1990's; 1 in ~ 2000" (11/07/2015)  . Obesity   . On home oxygen therapy    "have it available; don't use it" (11/07/2015)  . OSA on CPAP    no longer on cpap  . Paroxysmal ventricular tachycardia ([place])   . Restless leg syndrome   . Shortness of breath dyspnea      Patient Active Problem List   Diagnosis Date Noted  . Acute on chronic systolic CHF (congestive heart failure) ([place]) 01/01/2017  . Anemia due to chronic blood loss   . Common bile duct (CBD) obstruction   . Choledocholithiasis   . Chronic cholecystitis with calculus 04/23/2016  . Chronic anticoagulation 04/23/2016  . Septic shock ([place]) 03/03/2016  . Acute calculous cholecystitis 03/03/2016  . Chronic systolic (congestive) heart failure   . Hypotension 02/26/2016  . AVM (arteriovenous malformation) of colon with hemorrhage 02/23/2016  . Atrial flutter ([place]) 12/08/2015  . AVM (arteriovenous malformation) of small bowel, acquired ([place])   . Hemorrhoid   . BRBPR (bright red blood per rectum)   . Symptomatic anemia 11/07/2015  . Epistaxis, recurrent   . PAF (paroxysmal atrial fibrillation) ([place])   . Acute on chronic systolic congestive heart failure ([place])   . Thrombocytopenia ([place]) 08/10/2015  . Leg pain, left 07/15/2015  . Left ventricular assist device present ([place])   . Pacemaker complications 27/03/2375  . 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 Anemia due to blood loss, acute   . GI bleed 09/25/2014  . Atrial fibrillation, unspecified   . Angiodysplasia of duodenum with hemorrhage 08/01/2014  . Chronic atrial fibrillation ([place])   . Intestinal bleeding   .  Acute blood loss anemia 07/23/2014  . Anemia 07/23/2014  . Benign neoplasm of colon 03/09/2014  . Internal hemorrhoids 03/09/2014  . Gastric erosions 03/09/2014  . GI bleeding 03/07/2014  . Anemia, iron deficiency 05/26/2013  . TIA (transient ischemic attack) 05/24/2013  . Essential hypertension 04/21/2013  . Reactive depression (situational) 01/22/2013  . Incisional pain 01/14/2013  . Acute respiratory failure with hypoxia ([employer]) 01/13/2013  . LVAD (left ventricular assist device) present ([place]) 01/13/2013  . COPD (chronic obstructive pulmonary disease) ([place]) 01/11/2013  . Abnormal PFTs  01/11/2013  . Pulmonary hypertension 01/11/2013  . Chronic systolic heart failure ([place]) 07/29/2012  . Obstructive sleep apnea 07/02/2012  . Coronary artery disease 04/22/2011  . Ventricular tachycardia ([place]) 02/15/2011  . Ischemic cardiomyopathy   . ICD (implantable cardiac defibrillator) in place   . Atrial fibrillation [facility])     Past Surgical History:  Procedure Laterality Date  . APPENDECTOMY    . CARDIAC DEFIBRILLATOR PLACEMENT  2004; ~ 2010   "cut it out in 2016 after it got infected"   . COLONOSCOPY N/A 03/09/2014   Procedure: COLONOSCOPY;  Surgeon: Inda Castle, MD;  Location: [facility];  Service: Endoscopy;  Laterality: N/A;  LVAD  patient  . COLONOSCOPY N/A 07/29/2014   Procedure: COLONOSCOPY;  Surgeon: Gatha Mayer, MD;  Location: [facility];  Service: Endoscopy;  Laterality: N/A;  . CORONARY ARTERY BYPASS GRAFT  2003   "?X4" (05/24/2013)  . ENTEROSCOPY N/A 07/27/2014   Procedure: ENTEROSCOPY;  Surgeon: Gatha Mayer, MD;  Location: [facility] ENDOSCOPY;  Service: Endoscopy;  Laterality: N/A;  LVAD patient   . ENTEROSCOPY N/A 09/27/2014   Procedure: ENTEROSCOPY;  Surgeon: Gatha Mayer, MD;  Location: [facility] ENDOSCOPY;  Service: Endoscopy;  Laterality: N/A;  . ENTEROSCOPY N/A 11/09/2015   Procedure: ENTEROSCOPY;  Surgeon: Mauri Pole, MD;  Location: [facility] ENDOSCOPY;  Service: Endoscopy;  Laterality: N/A;  LVAD  . ENTEROSCOPY N/A 12/13/2015   Procedure: ENTEROSCOPY;  Surgeon: Milus Banister, MD;  Location: [facility];  Service: Endoscopy;  Laterality: N/A;  . ENTEROSCOPY N/A 02/21/2016   Procedure: ENTEROSCOPY;  Surgeon: Milus Banister, MD;  Location: [facility];  Service: Endoscopy;  Laterality: N/A;  . ERCP N/A 06/13/2016   Procedure: ENDOSCOPIC RETROGRADE CHOLANGIOPANCREATOGRAPHY (ERCP);  Surgeon: Milus Banister, MD;  Location: [facility];  Service: Endoscopy;  Laterality: N/A;  . ESOPHAGOGASTRODUODENOSCOPY N/A 03/09/2014   Procedure: ESOPHAGOGASTRODUODENOSCOPY  (EGD);  Surgeon: Inda Castle, MD;  Location: [facility];  Service: Endoscopy;  Laterality: N/A;  . ESOPHAGOGASTRODUODENOSCOPY N/A 08/01/2014   Procedure: ESOPHAGOGASTRODUODENOSCOPY (EGD);  Surgeon: Jerene Bears, MD;  [facility]COPY;  Service: Endoscopy;  Laterality: N/A;  LVAD patient  . GIVENS CAPSULE STUDY N/A 03/10/2014   Procedure: GIVENS CAPSULE STUDY;  Surgeon: Inda Castle, MD;  Location: [facility];  Service: Endoscopy;  Laterality: N/A;  . GIVENS CAPSULE STUDY N/A 07/30/2014   Procedure: GIVENS CAPSULE STUDY;  Surgeon: Gatha Mayer, MD;  Location: [facility];  Service: Endoscopy;  Laterality: N/A;  . GIVENS CAPSULE STUDY N/A 12/11/2015   Procedure: GIVENS CAPSULE STUDY;  Surgeon: Gatha Mayer, MD;  Location: [facility];  Service: Endoscopy;  Laterality: N/A;  . ICD LEAD REMOVAL Left 03/16/2015   Procedure: ICD LEAD REMOVAL/EXTRACTION;  Surgeon: Evans Lance, MD;  Location: [facility];  Service: Cardiovascular;  Laterality: Left;  PATIENT HAS LVAD  DR. VAN TRIGT TO BACK UP EXTRACTION  . INSERTION OF IMPLANTABLE LEFT VENTRICULAR ASSIST DEVICE N/A  01/12/2013   Procedure: INSERTION OF IMPLANTABLE LEFT VENTRICULAR ASSIST DEVICE;  Surgeon: Ivin Poot, MD;  Location: [facility];  Service: Open Heart Surgery;  Laterality: N/A;   nitric oxide; Redo sternotomy  . INTRAOPERATIVE TRANSESOPHAGEAL ECHOCARDIOGRAM N/A 01/12/2013   Procedure: INTRAOPERATIVE TRANSESOPHAGEAL ECHOCARDIOGRAM;  Surgeon: Ivin Poot, MD;  Location: [facility];  Service: Open Heart Surgery;  Laterality: N/A;  . IR GENERIC HISTORICAL  07/23/2016   IR SINUS/FIST TUBE CHK-NON GI 07/23/2016 Arne Cleveland, MD [facility]INTERV RAD  . IR GENERIC HISTORICAL  05/14/2016   IR RADIOLOGIST EVAL & MGMT 05/14/2016 [facility] INTERV RAD  . LEFT AND RIGHT HEART CATHETERIZATION WITH CORONARY/GRAFT ANGIOGRAM  01/07/2013   Procedure: LEFT AND RIGHT HEART CATHETERIZATION WITH Beatrix Fetters;  Surgeon: Jolaine Artist, MD;   Location: [facility] CATH LAB;  Service: Cardiovascular;;       Home Medications    Prior to Admission medications   Medication Sig Start Date End Date Taking? Authorizing Provider  albuterol (PROVENTIL) (2.5 MG/3ML) 0.083% nebulizer solution Take 2.5 mg by nebulization every 6 (six) hours as needed for wheezing or shortness of breath.   Yes Historical Provider, MD  amiodarone (PACERONE) 200 MG tablet Take 1 tablet (200 mg total) by mouth every evening. Patient taking differently: Take 200 mg by mouth at bedtime.  03/29/16  Yes Larey Dresser, MD  apixaban (ELIQUIS) 5 MG TABS tablet Take 1 tablet (5 mg total) by mouth 2 (two) times daily. 12/25/15  Yes Jolaine Artist, MD  budesonide-formoterol [facility]) 160-4.5 MCG/ACT inhaler Inhale 2 puffs into the lungs 2 (two) times daily. 01/10/15  Yes Jolaine Artist, MD  cholecalciferol (VITAMIN D) 1000 UNITS tablet Take 1 tablet (1,000 Units total) by mouth 2 (two) times daily. 01/10/15  Yes Jolaine Artist, MD  citalopram (CELEXA) 40 MG tablet Take 1 tablet (40 mg total) by mouth at bedtime. 01/10/15  Yes Jolaine Artist, MD  docusate sodium (COLACE) 100 MG capsule Take 200 mg by mouth 2 (two) times daily.    Yes Historical Provider, MD  ferumoxytol 510 mg in sodium chloride 0.9 % 100 mL Inject 510 mg into the vein as needed (low iron - checked every 2 weeks).   Yes Historical Provider, MD  furosemide (LASIX) 40 MG tablet Use only if a weight gain of 5 pounds in a week occurs or evident swelling is present Patient taking differently: Take 40 mg by mouth daily as needed (for weight gain of 5 lb in <3 days or for ankle swelling).  03/29/16  Yes Larey Dresser, MD  hydrocortisone (ANUSOL-HC) 2.5 % rectal cream Place 1 application rectally 2 (two) times daily. Patient taking differently: Place 1 application rectally daily as needed for hemorrhoids or itching.  11/21/15  Yes Larey Dresser, MD  levothyroxine (SYNTHROID, LEVOTHROID) 100 MCG tablet Take 1  tablet (100 mcg total) by mouth at bedtime. 02/23/16  Yes Amy D Clegg, NP  losartan (COZAAR) 25 MG tablet Take 25 mg by mouth at bedtime.   Yes Historical Provider, MD  magnesium hydroxide (MILK OF MAGNESIA) 400 MG/5ML suspension Take 30 mLs by mouth daily as needed (constipation).   Yes Historical Provider, MD  Multiple Vitamin (MULTIVITAMIN WITH MINERALS) TABS tablet Take 1 tablet by mouth at bedtime.    Yes Historical Provider, MD  nitroGLYCERIN (NITROSTAT) 0.4 MG SL tablet Place 0.4 mg under the tongue every 5 (five) minutes as needed for chest pain. Reported on 04/09/2016   Yes Historical Provider, MD  octreotide (SANDOSTATIN LAR) 20 MG injection Inject 20 mg into the muscle every 28 (twenty-eight) days. 08/29/16 01/17/17 Yes Shaune Pascal Bensimhon, MD  pantoprazole (PROTONIX) 40 MG tablet Take 1 tablet (40 mg total) by mouth 2 (two) times daily. 01/10/15  Yes Shaune Pascal Bensimhon, MD  potassium chloride SA (K-DUR,KLOR-CON) 20 MEQ tablet Take 2 tablets (40 mEq total) by mouth daily. And take extra 1 pill with lasix when needed. Patient taking differently: Take 20 mEq by mouth See admin instructions. Take 1 tablet (20 meq) by mouth twice daily/ take 2 extra tablets with each dose of Lasix 04/30/16  Yes Amy D Clegg, NP  PRESCRIPTION MEDICATION Inhale into the lungs at bedtime. CPAP   Yes Historical Provider, MD  simvastatin (ZOCOR) 80 MG tablet Take 0.5 tablets (40 mg total) by mouth at bedtime. 01/10/15  Yes Jolaine Artist, MD  tiotropium (SPIRIVA) 18 MCG inhalation capsule Place 1 capsule (18 mcg total) into inhaler and inhale daily. 01/10/15  Yes Jolaine Artist, MD  vitamin B-12 (CYANOCOBALAMIN) 1000 MCG tablet Take 1,000 mcg by mouth at bedtime.   Yes Historical Provider, MD  vitamin C (ASCORBIC ACID) 500 MG tablet Take 1 tablet (500 mg total) by mouth 2 (two) times daily. 01/10/15  Yes Jolaine Artist, MD    Family History Family History  Problem Relation Age of Onset  . Heart attack Mother     . Heart attack Father     Social History Social History  Substance Use Topics  . Smoking status: Former Smoker    Packs/day: 2.00    Years: 45.00    Types: Cigarettes    Quit date: 11/11/2001  . Smokeless tobacco: Never Used  . Alcohol use No     Comment: 05/24/2013 "use to drink beer; hardly nothing since 2003; nothing at all since 12/2011 when I got my LVAD  "     Allergies   Tape   Review of Systems Review of Systems  Constitutional: Positive for fatigue. Negative for chills and fever.  Respiratory: Positive for shortness of breath. Negative for cough and wheezing.   Cardiovascular: Negative for chest pain, palpitations and leg swelling.  Gastrointestinal: Negative for abdominal pain, blood in stool, diarrhea, nausea and vomiting.  Genitourinary: Negative for dysuria, flank pain and frequency.  Musculoskeletal: Negative for back pain, myalgias, neck pain and neck stiffness.  Skin: Negative for rash and wound.  Neurological: Negative for dizziness, weakness, light-headedness, numbness and headaches.  All other systems reviewed and are negative.    Physical Exam Updated Vital Signs BP (!) 80/0 (BP Location: Left Arm)   Pulse (!) 102   Temp 98 F (36.7 C) (Oral)   Resp (!) 28   Ht 6\' 1"  (1.854 m)   Wt 225 lb (102.1 kg)   SpO2 100%   BMI 29.69 kg/m   Physical Exam  Constitutional: He is oriented to person, place, and time. He appears well-developed and well-nourished.  HENT:  Head: Normocephalic and atraumatic.  Mouth/Throat: Oropharynx is clear and moist.  Pale mucous membranes  Eyes: EOM are normal. Pupils are equal, round, and reactive to light.  Neck: Normal range of motion. Neck supple.  Cardiovascular:  LVAD with mechanical hum  Pulmonary/Chest: Effort normal and breath sounds normal.  Few crackles in bilateral bases.  Abdominal: Soft. Bowel sounds are normal. There is no tenderness. There is no rebound and no guarding.  Musculoskeletal: Normal range  of motion. He exhibits no edema or tenderness.  No lower  extremity swelling or asymmetry.  Neurological: He is alert and oriented to person, place, and time.  Moving all extremities without focal deficit. Sensation intact.  Skin: Skin is warm and dry. Capillary refill takes less than 2 seconds. No rash noted. No erythema.  Psychiatric: He has a normal mood and affect. His behavior is normal.  Nursing note and vitals reviewed.    ED Treatments / Results  Labs (all labs ordered are listed, but only abnormal results are displayed) Labs Reviewed  BRAIN NATRIURETIC PEPTIDE - Abnormal; Notable for the following:       Result Value   B Natriuretic Peptide 346.1 (*)    All other components within normal limits  CBC WITH DIFFERENTIAL/PLATELET - Abnormal; Notable for the following:    RBC 3.40 (*)    Hemoglobin 9.8 (*)    HCT 34.1 (*)    MCV 100.3 (*)    MCHC 28.7 (*)    RDW 18.9 (*)    Neutro Abs 9.1 (*)    Lymphs Abs 0.4 (*)    All other components within normal limits  COMPREHENSIVE METABOLIC PANEL - Abnormal; Notable for the following:    CO2 19 (*)    Glucose, Bld 143 (*)    BUN 26 (*)    Creatinine, Ser 1.42 (*)    Albumin 3.4 (*)    AST 128 (*)    Total Bilirubin 3.0 (*)    GFR calc non Af Amer 47 (*)    GFR calc Af Amer 54 (*)    All other components within normal limits  LACTATE DEHYDROGENASE - Abnormal; Notable for the following:    LDH 1,370 (*)    All other components within normal limits  I-STAT CHEM 8, ED - Abnormal; Notable for the following:    BUN 31 (*)    Creatinine, Ser 1.40 (*)    Glucose, Bld 148 (*)    Calcium, Ion 1.14 (*)    Hemoglobin 10.9 (*)    HCT 32.0 (*)    All other components within normal limits  COOXEMETRY PANEL  COOXEMETRY PANEL  I-STAT TROPOININ, ED  TYPE AND SCREEN    EKG  EKG Interpretation  Date/Time:    Ventricular Rate:  98 PR Interval:    QRS Duration: 108 QT Interval:  417 QTC Calculation: 536 R Axis:   -73 Text  Interpretation:  Atrial fibrillation Paired ventricular premature complexes Left anterior fascicular block Abnormal R-wave progression, late transition Repol abnrm suggests ischemia, anterolateral Minimal ST elevation, lateral leads Prolonged QT interval Baseline wander in lead(s) V5 Confirmed by Lita Mains  MD, Simrit Gohlke ([id number]) on 01/03/2017 9:56:38 PM       Radiology Dg Chest Port 1 View  Result Date: 01/01/2017 CLINICAL DATA:  Shortness of breath for 1 week. Ischemic cardiomyopathy. COPD. Left ventricular assist device. EXAM: PORTABLE CHEST 1 VIEW COMPARISON:  02/26/2016 FINDINGS: Cardiomegaly stable. Left ventricular assist device is again seen, as well as prior CABG. Pulmonary hyperinflation again noted. Increased pulmonary interstitial prominence is suspicious for mild interstitial edema. No evidence of pulmonary consolidation or pleural effusion. IMPRESSION: Increased pulmonary interstitial prominence, suspicious for mild interstitial edema. Stable cardiomegaly with left ventricular assist device. Electronically Signed   By: Earle Gell M.D.   On: 01/23/2017 19:02    Procedures Procedures (including critical care time)  Medications Ordered in ED Medications  ondansetron (ZOFRAN) injection 4 mg (4 mg Intravenous Given 01/14/2017 2141)  furosemide (LASIX) injection 80 mg (80 mg Intravenous Given 01/22/2017 2016)  Initial Impression / Assessment and Plan / ED Course  I have reviewed the triage vital signs and the nursing notes.  Pertinent labs & imaging results that were available during my care of the patient were reviewed by me and considered in my medical decision making (see chart for details).     Discussed with Dr.Bensimhon. Suggested giving dose of IV Lasix. Will see patient in the emergency department.  Cardiology will admit. Final Clinical Impressions(s) / ED Diagnoses   Final diagnoses:  Acute on chronic systolic CHF (congestive heart failure) ([place])    New Prescriptions New  Prescriptions   No medications on file     Julianne Rice, MD 12/30/2016 2156

## 2017-01-04 NOTE — Progress Notes (Signed)
Received page from pts wife [name] stating that the pt was enroute to the ED via EMS and that he was "unable to breathe." ED charge nurse notified, rapid response notified. Dr. Haroldine Laws aware of pt arrival to the ED and is available for consult. Please page VAD coordinator with equipment issues or concerns.  Tanda Rockers RN VAD Coordinator VAD Team Pager [phone] (7am - 7am)

## 2017-01-05 ENCOUNTER — Inpatient Hospital Stay (HOSPITAL_COMMUNITY): Payer: Non-veteran care | Admitting: Certified Registered"

## 2017-01-05 ENCOUNTER — Inpatient Hospital Stay (HOSPITAL_COMMUNITY): Payer: Non-veteran care

## 2017-01-05 DIAGNOSIS — R570 Cardiogenic shock: Secondary | ICD-10-CM

## 2017-01-05 DIAGNOSIS — I5023 Acute on chronic systolic (congestive) heart failure: Secondary | ICD-10-CM

## 2017-01-05 DIAGNOSIS — T829XXA Unspecified complication of cardiac and vascular prosthetic device, implant and graft, initial encounter: Secondary | ICD-10-CM

## 2017-01-05 DIAGNOSIS — I509 Heart failure, unspecified: Secondary | ICD-10-CM

## 2017-01-05 LAB — APTT
APTT: 42 s — AB (ref 24–36)
APTT: 84 s — AB (ref 24–36)
APTT: 82 s — AB (ref 24–36)
aPTT: >200 seconds (ref 24–36)

## 2017-01-05 LAB — CBC
HCT: 33.5 % — ABNORMAL LOW (ref 39.0–52.0)
HCT: 24.7 % — ABNORMAL LOW (ref 39.0–52.0)
HEMATOCRIT: 30.8 % — AB (ref 39.0–52.0)
Hemoglobin: 9.8 g/dL — ABNORMAL LOW (ref 13.0–17.0)
Hemoglobin: 9.4 g/dL — ABNORMAL LOW (ref 13.0–17.0)
Hemoglobin: 6.9 g/dL — CL (ref 13.0–17.0)
MCH: 29.1 pg (ref 26.0–34.0)
MCH: 30.5 pg (ref 26.0–34.0)
MCH: 30.1 pg (ref 26.0–34.0)
MCHC: 29.3 g/dL — AB (ref 30.0–36.0)
MCHC: 30.5 g/dL (ref 30.0–36.0)
MCHC: 27.9 g/dL — ABNORMAL LOW (ref 30.0–36.0)
MCV: 99.4 fL (ref 78.0–100.0)
MCV: 100 fL (ref 78.0–100.0)
MCV: 107.9 fL — ABNORMAL HIGH (ref 78.0–100.0)
PLATELETS: 169 10*3/uL (ref 150–400)
PLATELETS: 164 10*3/uL (ref 150–400)
PLATELETS: 160 10*3/uL (ref 150–400)
RBC: 3.37 MIL/uL — ABNORMAL LOW (ref 4.22–5.81)
RBC: 3.08 MIL/uL — ABNORMAL LOW (ref 4.22–5.81)
RBC: 2.29 MIL/uL — AB (ref 4.22–5.81)
RDW: 19.2 % — AB (ref 11.5–15.5)
RDW: 19.7 % — AB (ref 11.5–15.5)
RDW: 20.5 % — ABNORMAL HIGH (ref 11.5–15.5)
WBC: 12.3 10*3/uL — AB (ref 4.0–10.5)
WBC: 13.6 10*3/uL — ABNORMAL HIGH (ref 4.0–10.5)
WBC: 23 10*3/uL — AB (ref 4.0–10.5)

## 2017-01-05 LAB — BASIC METABOLIC PANEL
ANION GAP: 11 (ref 5–15)
Anion gap: 9 (ref 5–15)
BUN: 29 mg/dL — ABNORMAL HIGH (ref 6–20)
BUN: 34 mg/dL — AB (ref 6–20)
CALCIUM: 9.1 mg/dL (ref 8.9–10.3)
CALCIUM: 9 mg/dL (ref 8.9–10.3)
CHLORIDE: 109 mmol/L (ref 101–111)
CO2: 19 mmol/L — ABNORMAL LOW (ref 22–32)
CO2: 21 mmol/L — ABNORMAL LOW (ref 22–32)
CREATININE: 1.71 mg/dL — AB (ref 0.61–1.24)
CREATININE: 2.38 mg/dL — AB (ref 0.61–1.24)
Chloride: 106 mmol/L (ref 101–111)
GFR calc non Af Amer: 37 mL/min — ABNORMAL LOW (ref >60)
GFR, EST AFRICAN AMERICAN: 43 mL/min — AB (ref >60)
GFR, EST AFRICAN AMERICAN: 29 mL/min — AB (ref >60)
GFR, EST NON AFRICAN AMERICAN: 25 mL/min — AB (ref >60)
GLUCOSE: 174 mg/dL — AB (ref 65–99)
Glucose, Bld: 151 mg/dL — ABNORMAL HIGH (ref 65–99)
Potassium: 5.4 mmol/L — ABNORMAL HIGH (ref 3.5–5.1)
Potassium: 5.4 mmol/L — ABNORMAL HIGH (ref 3.5–5.1)
SODIUM: 139 mmol/L (ref 135–145)
Sodium: 136 mmol/L (ref 135–145)

## 2017-01-05 LAB — POCT I-STAT, CHEM 8
BUN: 40 mg/dL — ABNORMAL HIGH (ref 6–20)
BUN: 47 mg/dL — ABNORMAL HIGH (ref 6–20)
CALCIUM ION: 1.33 mmol/L (ref 1.15–1.40)
Calcium, Ion: 0.63 mmol/L — CL (ref 1.15–1.40)
Chloride: 93 mmol/L — ABNORMAL LOW (ref 101–111)
Chloride: 102 mmol/L (ref 101–111)
Creatinine, Ser: 2.8 mg/dL — ABNORMAL HIGH (ref 0.61–1.24)
Creatinine, Ser: 3.8 mg/dL — ABNORMAL HIGH (ref 0.61–1.24)
GLUCOSE: 603 mg/dL — AB (ref 65–99)
GLUCOSE: 263 mg/dL — AB (ref 65–99)
HCT: 24 % — ABNORMAL LOW (ref 39.0–52.0)
HCT: 23 % — ABNORMAL LOW (ref 39.0–52.0)
HEMOGLOBIN: 8.2 g/dL — AB (ref 13.0–17.0)
Hemoglobin: 7.8 g/dL — ABNORMAL LOW (ref 13.0–17.0)
Potassium: 3.8 mmol/L (ref 3.5–5.1)
Potassium: 3.7 mmol/L (ref 3.5–5.1)
Sodium: 130 mmol/L — ABNORMAL LOW (ref 135–145)
Sodium: 141 mmol/L (ref 135–145)
TCO2: 16 mmol/L (ref 0–100)
TCO2: 17 mmol/L (ref 0–100)

## 2017-01-05 LAB — POCT I-STAT 3, VENOUS BLOOD GAS (G3P V)
ACID-BASE DEFICIT: 13 mmol/L — AB (ref 0.0–2.0)
Bicarbonate: 16.1 mmol/L — ABNORMAL LOW (ref 20.0–28.0)
O2 Saturation: 81 %
TCO2: 18 mmol/L (ref 0–100)
pCO2, Ven: 50.3 mmHg (ref 44.0–60.0)
pH, Ven: 7.107 — CL (ref 7.250–7.430)
pO2, Ven: 57 mmHg — ABNORMAL HIGH (ref 32.0–45.0)

## 2017-01-05 LAB — COOXEMETRY PANEL
CARBOXYHEMOGLOBIN: 3.3 % — AB (ref 0.5–1.5)
CARBOXYHEMOGLOBIN: 2.6 % — AB (ref 0.5–1.5)
METHEMOGLOBIN: 1.8 % — AB (ref 0.0–1.5)
Methemoglobin: 1.5 % (ref 0.0–1.5)
O2 SAT: 65.5 %
O2 SAT: 63.3 %
TOTAL HEMOGLOBIN: 9.4 g/dL — AB (ref 12.0–16.0)
TOTAL HEMOGLOBIN: 7.3 g/dL — AB (ref 12.0–16.0)

## 2017-01-05 LAB — ECHOCARDIOGRAM COMPLETE
HEIGHTINCHES: 73 in
WEIGHTICAEL: 3449.76 [oz_av]

## 2017-01-05 LAB — SEDIMENTATION RATE: Sed Rate: 20 mm/hr — ABNORMAL HIGH (ref 0–16)

## 2017-01-05 LAB — FIBRINOGEN
FIBRINOGEN: 296 mg/dL (ref 210–475)
Fibrinogen: 310 mg/dL (ref 210–475)

## 2017-01-05 LAB — LACTIC ACID, PLASMA
LACTIC ACID, VENOUS: 3 mmol/L — AB (ref 0.5–1.9)
LACTIC ACID, VENOUS: 11.4 mmol/L — AB (ref 0.5–1.9)
Lactic Acid, Venous: 2.8 mmol/L (ref 0.5–1.9)
Lactic Acid, Venous: 3.9 mmol/L (ref 0.5–1.9)

## 2017-01-05 LAB — LACTATE DEHYDROGENASE
LDH: 1731 U/L — AB (ref 98–192)
LDH: 2508 U/L — AB (ref 98–192)

## 2017-01-05 LAB — TSH: TSH: 3.709 u[IU]/mL (ref 0.350–4.500)

## 2017-01-05 LAB — MAGNESIUM: MAGNESIUM: 2 mg/dL (ref 1.7–2.4)

## 2017-01-05 LAB — T4, FREE: Free T4: 1.43 ng/dL — ABNORMAL HIGH (ref 0.61–1.12)

## 2017-01-05 MED ORDER — CALCIUM CHLORIDE 10 % IV SOLN
INTRAVENOUS | Status: AC
  Administered 2017-01-05: 19:00:00
  Filled 2017-01-05: qty 10

## 2017-01-05 MED ORDER — SODIUM BICARBONATE 8.4 % IV SOLN
50.0000 meq | Freq: Once | INTRAVENOUS | Status: AC
  Administered 2017-01-05: 50 meq via INTRAVENOUS
  Filled 2017-01-05: qty 50

## 2017-01-05 MED ORDER — ONDANSETRON HCL 4 MG/2ML IJ SOLN
4.0000 mg | Freq: Once | INTRAMUSCULAR | Status: AC
  Administered 2017-01-05: 4 mg via INTRAVENOUS

## 2017-01-05 MED ORDER — SODIUM CHLORIDE 0.9 % IV SOLN
0.5000 mg/h | INTRAVENOUS | Status: DC
  Administered 2017-01-06: 1 mg/h via INTRAVENOUS
  Filled 2017-01-05: qty 10

## 2017-01-05 MED ORDER — AMIODARONE HCL IN DEXTROSE 360-4.14 MG/200ML-% IV SOLN
30.0000 mg/h | INTRAVENOUS | Status: DC
  Administered 2017-01-05: 30 mg/h via INTRAVENOUS

## 2017-01-05 MED ORDER — FENTANYL CITRATE (PF) 100 MCG/2ML IJ SOLN
INTRAMUSCULAR | Status: AC
  Administered 2017-01-05: 50 ug
  Filled 2017-01-05: qty 2

## 2017-01-05 MED ORDER — MILRINONE LACTATE IN DEXTROSE 20-5 MG/100ML-% IV SOLN
0.2500 ug/kg/min | INTRAVENOUS | Status: DC
  Administered 2017-01-05: 0.125 ug/kg/min via INTRAVENOUS
  Administered 2017-01-05: 0.25 ug/kg/min via INTRAVENOUS
  Filled 2017-01-05 (×2): qty 100

## 2017-01-05 MED ORDER — LEVOTHYROXINE SODIUM 100 MCG IV SOLR
50.0000 ug | Freq: Every day | INTRAVENOUS | Status: DC
  Administered 2017-01-06 – 2017-01-07 (×2): 50 ug via INTRAVENOUS
  Filled 2017-01-05 (×2): qty 5

## 2017-01-05 MED ORDER — FUROSEMIDE 10 MG/ML IJ SOLN
80.0000 mg | Freq: Once | INTRAMUSCULAR | Status: AC
  Administered 2017-01-05: 80 mg via INTRAVENOUS
  Filled 2017-01-05: qty 8

## 2017-01-05 MED ORDER — DEXTROSE 5 % IV SOLN
INTRAVENOUS | Status: DC
  Administered 2017-01-05 – 2017-01-07 (×4): via INTRAVENOUS
  Filled 2017-01-05 (×5): qty 150

## 2017-01-05 MED ORDER — MIDAZOLAM HCL 2 MG/2ML IJ SOLN
INTRAMUSCULAR | Status: AC
  Administered 2017-01-05: 2 mg
  Filled 2017-01-05: qty 2

## 2017-01-05 MED ORDER — NOREPINEPHRINE BITARTRATE 1 MG/ML IV SOLN
0.0000 ug/min | INTRAVENOUS | Status: DC
  Administered 2017-01-05: 30 ug/min via INTRAVENOUS
  Administered 2017-01-06 – 2017-01-07 (×5): 40 ug/min via INTRAVENOUS
  Administered 2017-01-07 (×2): 60 ug/min via INTRAVENOUS
  Administered 2017-01-08: 75 ug/min via INTRAVENOUS
  Administered 2017-01-08: 80 ug/min via INTRAVENOUS
  Filled 2017-01-05 (×13): qty 16

## 2017-01-05 MED ORDER — FENTANYL 2500MCG IN NS 250ML (10MCG/ML) PREMIX INFUSION
25.0000 ug/h | INTRAVENOUS | Status: DC
  Administered 2017-01-05: 50 ug/h via INTRAVENOUS
  Filled 2017-01-05: qty 250

## 2017-01-05 MED ORDER — AMIODARONE HCL IN DEXTROSE 360-4.14 MG/200ML-% IV SOLN
60.0000 mg/h | INTRAVENOUS | Status: AC
  Administered 2017-01-05 – 2017-01-06 (×3): 60 mg/h via INTRAVENOUS
  Filled 2017-01-05 (×4): qty 200

## 2017-01-05 MED ORDER — SODIUM CHLORIDE 0.9 % IV SOLN
INTRAVENOUS | Status: DC
  Administered 2017-01-05: 15:00:00 via INTRAVENOUS

## 2017-01-05 MED ORDER — SODIUM CHLORIDE 0.9% FLUSH
10.0000 mL | Freq: Two times a day (BID) | INTRAVENOUS | Status: DC
  Administered 2017-01-05: 30 mL
  Administered 2017-01-05 – 2017-01-06 (×3): 10 mL
  Administered 2017-01-07: 3 mL
  Administered 2017-01-07: 10 mL

## 2017-01-05 MED ORDER — SODIUM BICARBONATE 8.4 % IV SOLN
INTRAVENOUS | Status: AC
  Filled 2017-01-05: qty 100

## 2017-01-05 MED ORDER — EPINEPHRINE PF 1 MG/ML IJ SOLN
0.5000 ug/min | INTRAVENOUS | Status: DC
  Administered 2017-01-05: 5 ug/min via INTRAVENOUS
  Administered 2017-01-06: 2.5 ug/min via INTRAVENOUS
  Administered 2017-01-06: 8 ug/min via INTRAVENOUS
  Administered 2017-01-07: 12 ug/min via INTRAVENOUS
  Administered 2017-01-07 – 2017-01-08 (×5): 20 ug/min via INTRAVENOUS
  Filled 2017-01-05 (×13): qty 4

## 2017-01-05 MED ORDER — LORAZEPAM 2 MG/ML IJ SOLN
1.0000 mg | Freq: Once | INTRAMUSCULAR | Status: AC
Start: 2017-01-05 — End: 2017-01-05
  Administered 2017-01-05: 1 mg via INTRAVENOUS
  Filled 2017-01-05: qty 1

## 2017-01-05 MED ORDER — AMIODARONE LOAD VIA INFUSION
300.0000 mg | Freq: Once | INTRAVENOUS | Status: AC
  Administered 2017-01-05: 300 mg via INTRAVENOUS
  Filled 2017-01-05: qty 166.67

## 2017-01-05 MED ORDER — SUCCINYLCHOLINE CHLORIDE 20 MG/ML IJ SOLN
INTRAMUSCULAR | Status: DC | PRN
  Administered 2017-01-05: 100 mg via INTRAVENOUS

## 2017-01-05 MED ORDER — SODIUM CHLORIDE 0.9% FLUSH
3.0000 mL | Freq: Two times a day (BID) | INTRAVENOUS | Status: DC
  Administered 2017-01-07: 3 mL via INTRAVENOUS

## 2017-01-05 MED ORDER — ALTEPLASE 2 MG IJ SOLR
5.0000 mg | Freq: Once | INTRAMUSCULAR | Status: AC
  Administered 2017-01-05: 5 mg
  Filled 2017-01-05: qty 6

## 2017-01-05 MED ORDER — SODIUM BICARBONATE 8.4 % IV SOLN
50.0000 meq | Freq: Once | INTRAVENOUS | Status: AC
  Administered 2017-01-05: 50 meq via INTRAVENOUS

## 2017-01-05 MED ORDER — ALTEPLASE 50 MG IV SOLR
3.0000 mg/h | Freq: Once | INTRAVENOUS | Status: DC
  Filled 2017-01-05: qty 24

## 2017-01-05 MED ORDER — SODIUM CHLORIDE 0.9 % IV SOLN: 250.0000 mL | INTRAVENOUS | Status: DC | PRN

## 2017-01-05 MED ORDER — SODIUM BICARBONATE 8.4 % IV SOLN
INTRAVENOUS | Status: AC
  Filled 2017-01-05: qty 50

## 2017-01-05 MED ORDER — ETOMIDATE 2 MG/ML IV SOLN
INTRAVENOUS | Status: DC | PRN
  Administered 2017-01-05: 10 mg via INTRAVENOUS

## 2017-01-05 MED ORDER — VASOPRESSIN 20 UNIT/ML IV SOLN
0.0300 [IU]/min | INTRAVENOUS | Status: DC
  Administered 2017-01-05 – 2017-01-07 (×3): 0.03 [IU]/min via INTRAVENOUS
  Filled 2017-01-05 (×4): qty 2

## 2017-01-05 MED ORDER — AMIODARONE HCL IN DEXTROSE 360-4.14 MG/200ML-% IV SOLN
60.0000 mg/h | INTRAVENOUS | Status: AC
  Administered 2017-01-05 (×2): 60 mg/h via INTRAVENOUS
  Filled 2017-01-05 (×2): qty 200

## 2017-01-05 MED ORDER — SODIUM POLYSTYRENE SULFONATE 15 GM/60ML PO SUSP
30.0000 g | Freq: Once | ORAL | Status: AC
  Administered 2017-01-05: 30 g via ORAL
  Filled 2017-01-05: qty 120

## 2017-01-05 MED ORDER — LORAZEPAM 2 MG/ML IJ SOLN
1.0000 mg | Freq: Once | INTRAMUSCULAR | Status: AC
  Administered 2017-01-05: 1 mg via INTRAVENOUS

## 2017-01-05 MED ORDER — HEPARIN (PORCINE) IN NACL 100-0.45 UNIT/ML-% IJ SOLN
1200.0000 [IU]/h | INTRAMUSCULAR | Status: DC
  Administered 2017-01-05: 1600 [IU]/h via INTRAVENOUS
  Filled 2017-01-05 (×2): qty 250

## 2017-01-05 MED ORDER — ALTEPLASE 50 MG IV SOLR
3.0000 mg/h | Freq: Once | INTRAVENOUS | Status: AC
  Administered 2017-01-05: 18:00:00 via INTRAVENOUS
  Administered 2017-01-05: 3 mg/h via INTRAVENOUS
  Filled 2017-01-05: qty 24

## 2017-01-05 MED ORDER — PANTOPRAZOLE SODIUM 40 MG IV SOLR
40.0000 mg | Freq: Two times a day (BID) | INTRAVENOUS | Status: DC
  Administered 2017-01-05 – 2017-01-07 (×5): 40 mg via INTRAVENOUS
  Filled 2017-01-05 (×5): qty 40

## 2017-01-05 MED ORDER — SODIUM CHLORIDE 0.9% FLUSH: 3.0000 mL | INTRAVENOUS | Status: DC | PRN

## 2017-01-05 MED ORDER — MIDAZOLAM HCL 2 MG/2ML IJ SOLN: 1.0000 mg | Freq: Once | INTRAMUSCULAR | Status: DC

## 2017-01-05 MED ORDER — CHLORHEXIDINE GLUCONATE CLOTH 2 % EX PADS
6.0000 | MEDICATED_PAD | Freq: Every day | CUTANEOUS | Status: DC
  Administered 2017-01-06 – 2017-01-07 (×2): 6 via TOPICAL

## 2017-01-05 MED ORDER — SODIUM CHLORIDE 0.9 % IV SOLN: INTRAVENOUS | Status: DC

## 2017-01-05 MED ORDER — SODIUM CHLORIDE 0.9 % IV SOLN
2.0000 g | Freq: Once | INTRAVENOUS | Status: AC
  Administered 2017-01-05: 2 g via INTRAVENOUS

## 2017-01-05 MED ORDER — SODIUM CHLORIDE 0.9% FLUSH: 10.0000 mL | INTRAVENOUS | Status: DC | PRN

## 2017-01-05 MED ORDER — SODIUM CHLORIDE 0.9 % IV SOLN
Freq: Once | INTRAVENOUS | Status: AC
  Administered 2017-01-05: via INTRAVENOUS

## 2017-01-05 MED ORDER — SODIUM BICARBONATE 4.2 % IV SOLN
50.0000 meq | Freq: Once | INTRAVENOUS | Status: AC
  Administered 2017-01-05: 50 meq via INTRAVENOUS

## 2017-01-05 MED ORDER — LORAZEPAM 2 MG/ML IJ SOLN
1.0000 mg | Freq: Once | INTRAMUSCULAR | Status: DC
  Filled 2017-01-05: qty 1

## 2017-01-05 MED ORDER — AMIODARONE HCL IN DEXTROSE 360-4.14 MG/200ML-% IV SOLN: 30.0000 mg/h | INTRAVENOUS | Status: DC

## 2017-01-05 NOTE — Progress Notes (Signed)
    LVAD Team Follow-up Note  He continues to deteriorate with progressive cardiogenic shock and MSOF in setting of LVAD pump thrombosis.  Back from CT without outflow tract obstruction. (Images reviewed personally).  Upon returning from CT had sustained VT with clinical deterioration. Given 300mg   Amiodarone with resolution of VT.   F/u labs back with LDH >2,500 (doubling every 6 hours). And worsening lactic acidosis. Still anuric.   On exam nauseated and pale. Diaphoretic. Restless. JVP up. LVAD sounds muffled. Extremities cool.   I have discussed the case with [facility] and Dr. Cyndia Bent. Patient too ill for transfer or emergent pump exchange. Would almost certainly not survive surgery.   Will plan low-dose t-pa protocol (5 mg bolus t-pa followed by 3mg /hr x 10 hours) following protocol outlined in the [facility] ([place] 2016: [phone])  Patient and family aware of risk of GI bleeding, ICH and death but understand their is no alternative to save his life at this point.   T-pa protocol discussed with Pharmacy team and VAD coordinators.   IV protonix started. Active type and sceen done.   Critical care time - additional 70 minutes.   Glori Bickers, MD  2:55 PM

## 2017-01-05 NOTE — Progress Notes (Signed)
Called [facility] and reported AFIB rate @130 . No orders given at this time

## 2017-01-05 NOTE — Progress Notes (Signed)
Addendum:  Milrinone added. Breathing better but still nauseated.   Co-ox now 65%. Lactate elevated but stable at 3.0. Creatinine up to 2.3. CVP 14  Echo with LVEF ~ 30-35%. Good LVAD cannula position. AoV opening every beat. Mild to moderate MR. Septum midline. RV mild to moderate HK  Have increased milrinone to 0.25. Remains on norepi 4.   Will plan CT scan (non-contrast) to look at outflow graft.   Recheck lactate in 1 hour.   Given normalized co-ox no clear role for IABP at this point. Continue to titrate inotropes. Continue bival  Additional 45 mins CCT.  Glori Bickers, MD  11:48 AM

## 2017-01-05 NOTE — Progress Notes (Signed)
HR sustained 140's at this time; MD paged to make aware; MD on way to see pt; will cont. To monitor.  [name]

## 2017-01-05 NOTE — Anesthesia Procedure Notes (Signed)
Procedures

## 2017-01-05 NOTE — Anesthesia Procedure Notes (Signed)
Procedure Name: Intubation Date/Time: 01/05/2017 9:00 PM Performed by: Manuela Schwartz B Pre-anesthesia Checklist: Patient identified, Emergency Drugs available, Suction available, Patient being monitored and Timeout performed Patient Re-evaluated:Patient Re-evaluated prior to inductionOxygen Delivery Method: Ambu bag Preoxygenation: Pre-oxygenation with 100% oxygen Intubation Type: IV induction and Rapid sequence Grade View: Grade I Tube type: Subglottic suction tube Tube size: 7.5 mm Number of attempts: 1 Airway Equipment and Method: Stylet and Video-laryngoscopy (elective glidescope intubation) Placement Confirmation: ETT inserted through vocal cords under direct vision,  positive ETCO2 and breath sounds checked- equal and bilateral Secured at: 22 cm Tube secured with: Tape Dental Injury: Teeth and Oropharynx as per pre-operative assessment

## 2017-01-05 NOTE — Anesthesia Procedure Notes (Incomplete)
Procedures

## 2017-01-05 NOTE — Progress Notes (Signed)
ANTICOAGULATION CONSULT NOTE - Follow Up Consult  Pharmacy Consult for bivalirudin Indication: LVAD  Labs:  Recent Labs  01/14/2017 1913 01/06/2017 1932  01/05/17 0137 01/05/17 0526 01/05/17 1036 01/05/17 1037 01/05/17 1430  HGB 9.8* 10.9*  --  9.8*  --   --  9.4*  --   HCT 34.1* 32.0*  --  33.5*  --   --  30.8*  --   PLT 154  --   --  169  --   --  164  --   APTT  --   --   < > 84* 82*  --   --  >200*  CREATININE 1.42* 1.40*  --  1.71*  --  2.38*  --   --   < > = values in this interval not displayed.  Assessment:  76yo male with LVAD admitted with probable pump thrombosis. APTT was 80 > therapeutic on bivalirudin with initial dosing while Eliquis on hold. bivalirudin drip was moved from peripheral IV site to PICC line and aptt drawn from PICC line as well now > 200 error.   Will change to heparin drip in order to run TPA slowly to break up clot.   CBC stable, rising LDH 1300> 1700> 2500, rising lactic acid 2.8>3.9.  Plan Stop bivailurdin  Heparin drip 1600 uts/hr goal 0.3-0.5 ( aptt 66-90)  while TPA infusing  Draw HL/aPTT with next set of labs   [employer].D. CPP, BCPS Clinical Pharmacist [phone] 01/05/2017 5:11 PM

## 2017-01-05 NOTE — Progress Notes (Signed)
ANTICOAGULATION CONSULT NOTE - Follow Up Consult  Pharmacy Consult for bivalirudin Indication: LVAD  Labs:  Recent Labs  12/30/2016 1913 01/11/2017 1932 01/02/2017 2252 01/05/17 0137  HGB 9.8* 10.9*  --  9.8*  HCT 34.1* 32.0*  --  33.5*  PLT 154  --   --  169  APTT  --   --  42* 84*  CREATININE 1.42* 1.40*  --   --     Assessment/Plan:  76yo male therapeutic on bivalirudin with initial dosing while Eliquis on hold. Will continue gtt at current rate and confirm stable with additional PTT.   Wynona Neat, PharmD, BCPS  01/05/2017,2:24 AM

## 2017-01-05 NOTE — Progress Notes (Signed)
Peripherally Inserted Central Catheter/Midline Placement  The IV Nurse has discussed with the patient and/or persons authorized to consent for the patient, the purpose of this procedure and the potential benefits and risks involved with this procedure.  The benefits include less needle sticks, lab draws from the catheter, and the patient may be discharged home with the catheter. Risks include, but not limited to, infection, bleeding, blood clot (thrombus formation), and puncture of an artery; nerve damage and irregular heartbeat and possibility to perform a PICC exchange if needed/ordered by physician.  Alternatives to this procedure were also discussed.  Bard Power PICC patient education guide, fact sheet on infection prevention and patient information card has been provided to patient /or left at bedside.    PICC/Midline Placement Documentation  PICC Triple Lumen 01/05/17 PICC Right Brachial 42 cm 0 cm (Active)  Indication for Insertion or Continuance of Line Vasoactive infusions;Chronic illness with exacerbations (CF, Sickle Cell, etc.);Limited venous access - need for IV therapy >5 days (PICC only) 01/05/2017 10:31 AM  Exposed Catheter (cm) 0 cm 01/05/2017 10:31 AM  Site Assessment Clean;Dry;Intact 01/05/2017 10:31 AM  Lumen #1 Status Flushed;Saline locked;Blood return noted 01/05/2017 10:31 AM  Lumen #2 Status Flushed;Saline locked;Blood return noted 01/05/2017 10:31 AM  Lumen #3 Status Flushed;Saline locked;Blood return noted 01/05/2017 10:31 AM  Dressing Type Transparent 01/05/2017 10:31 AM  Dressing Status Clean;Dry;Intact;Antimicrobial disc in place 01/05/2017 10:31 AM  Line Care Connections checked and tightened 01/05/2017 10:31 AM  Line Adjustment (NICU/IV Team Only) No 01/05/2017 10:31 AM  Dressing Intervention New dressing 01/05/2017 10:31 AM  Dressing Change Due 01/12/17 01/05/2017 10:31 AM       Rolena Infante 01/05/2017, 10:33 AM

## 2017-01-05 NOTE — Consult Note (Signed)
Name: [name] MRN: [id number] DOB: 12-02-1940    ADMISSION DATE:  01/16/2017 CONSULTATION DATE:  01/05/2017  REFERRING MD :  Dr. Haroldine Laws   CHIEF COMPLAINT:  Respiratory distress   HISTORY OF PRESENT ILLNESS: Information obtained from medical record as patient is intubated and sedated  76 year old male with PMH of anemia, A.Fib, CAD s/p CABG (2003), liver cirrhosis, chronic systolic HF s/p LVAD 12/812 (complicated by recurrent anemia, switched from coumadin to Eliquis), emphysema, OSA (on CPAP). Patient presents 4/7 with reported dyspnea and 5 lbs weight gain. On arrival to ED CXR revealed mild pulmonary edema, cardiology concerned with pump dysfunction as parameters seemed disproportioned to CXR. 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 Started on Low dose t-pa protocol. Later in the day was tachycardiac, tachpneic, and diaphoretic, developed nausea, was intubated. PCCM asked to consult for vent management.    SIGNIFICANT EVENTS  4/7 > Presents to ED   STUDIES:  CXR 4/7 > Increased pulmonary interstitial prominence, stable cardiomegaly with LVAD CT Chest 4/8 > Cardiomegaly with perihilar and upper lobe edema indicating mild CHF/volume overload, patchy small consolidations at each lung base, likely atelectasis, small right pleural effusion and trace left, emphysematous changes, cirrhotic liver   PAST MEDICAL HISTORY :   has a past medical history of Anemia; Asbestosis(501); Atrial fibrillation ([employer]); Cancer [facility]); CHF (congestive heart failure) ([place]); Chronic anticoagulation; Chronic systolic heart failure ([place]); Cirrhosis of liver ([place]); Complication of anesthesia; COPD (chronic obstructive pulmonary disease) ([place]); Coronary artery disease; Depression; Epistaxis (11/2015, 07/2014); History of blood transfusion (08/2015 X 10; 11/2015 X 2); Hyperlipidemia; Hypertension; Hypothyroidism; Ischemic cardiomyopathy; LVAD (left ventricular assist device) present ([place]) (12/2012); Myocardial infarction  ([id number]); Obesity; On home oxygen therapy; OSA on CPAP; Paroxysmal ventricular tachycardia ([place]); Restless leg syndrome; and Shortness of breath dyspnea.  has a past surgical history that includes Coronary artery bypass graft (2003); Cardiac defibrillator placement (2004; ~ 2010); Insertion of implantable left ventricular assist device (N/A, 01/12/2013); Intraoprative transesophageal echocardiogram (N/A, 01/12/2013); Colonoscopy (N/A, 03/09/2014); Esophagogastroduodenoscopy (N/A, 03/09/2014); Givens capsule study (N/A, 03/10/2014); enteroscopy (N/A, 07/27/2014); Colonoscopy (N/A, 07/29/2014); Givens capsule study (N/A, 07/30/2014); Esophagogastroduodenoscopy (N/A, 08/01/2014); left and right heart catheterization with coronary/graft angiogram (01/07/2013); enteroscopy (N/A, 09/27/2014); Icd lead removal (Left, 03/16/2015); Appendectomy; enteroscopy (N/A, 11/09/2015); Givens capsule study (N/A, 12/11/2015); enteroscopy (N/A, 12/13/2015); enteroscopy (N/A, 02/21/2016); ERCP (N/A, 06/13/2016); ir generic historical (07/23/2016); and ir generic historical (05/14/2016). Prior to Admission medications   Medication Sig Start Date End Date Taking? Authorizing Provider  albuterol (PROVENTIL) (2.5 MG/3ML) 0.083% nebulizer solution Take 2.5 mg by nebulization every 6 (six) hours as needed for wheezing or shortness of breath.   Yes Historical Provider, MD  amiodarone (PACERONE) 200 MG tablet Take 1 tablet (200 mg total) by mouth every evening. Patient taking differently: Take 200 mg by mouth at bedtime.  03/29/16  Yes Larey Dresser, MD  apixaban (ELIQUIS) 5 MG TABS tablet Take 1 tablet (5 mg total) by mouth 2 (two) times daily. 12/25/15  Yes Jolaine Artist, MD  budesonide-formoterol [facility]) 160-4.5 MCG/ACT inhaler Inhale 2 puffs into the lungs 2 (two) times daily. 01/10/15  Yes Jolaine Artist, MD  cholecalciferol (VITAMIN D) 1000 UNITS tablet Take 1 tablet (1,000 Units total) by mouth 2 (two) times daily. 01/10/15  Yes  Jolaine Artist, MD  citalopram (CELEXA) 40 MG tablet Take 1 tablet (40 mg total) by mouth at bedtime. 01/10/15  Yes Jolaine Artist, MD  docusate sodium (COLACE) 100 MG capsule Take 200 mg by  mouth 2 (two) times daily.    Yes Historical Provider, MD  ferumoxytol 510 mg in sodium chloride 0.9 % 100 mL Inject 510 mg into the vein as needed (low iron - checked every 2 weeks).   Yes Historical Provider, MD  furosemide (LASIX) 40 MG tablet Use only if a weight gain of 5 pounds in a week occurs or evident swelling is present Patient taking differently: Take 40 mg by mouth daily as needed (for weight gain of 5 lb in <3 days or for ankle swelling).  03/29/16  Yes Larey Dresser, MD  hydrocortisone (ANUSOL-HC) 2.5 % rectal cream Place 1 application rectally 2 (two) times daily. Patient taking differently: Place 1 application rectally daily as needed for hemorrhoids or itching.  11/21/15  Yes Larey Dresser, MD  levothyroxine (SYNTHROID, LEVOTHROID) 100 MCG tablet Take 1 tablet (100 mcg total) by mouth at bedtime. 02/23/16  Yes Amy D Clegg, NP  losartan (COZAAR) 25 MG tablet Take 25 mg by mouth at bedtime.   Yes Historical Provider, MD  magnesium hydroxide (MILK OF MAGNESIA) 400 MG/5ML suspension Take 30 mLs by mouth daily as needed (constipation).   Yes Historical Provider, MD  Multiple Vitamin (MULTIVITAMIN WITH MINERALS) TABS tablet Take 1 tablet by mouth at bedtime.    Yes Historical Provider, MD  nitroGLYCERIN (NITROSTAT) 0.4 MG SL tablet Place 0.4 mg under the tongue every 5 (five) minutes as needed for chest pain. Reported on 04/09/2016   Yes Historical Provider, MD  octreotide (SANDOSTATIN LAR) 20 MG injection Inject 20 mg into the muscle every 28 (twenty-eight) days. 08/29/16 01/17/17 Yes Shaune Pascal Bensimhon, MD  pantoprazole (PROTONIX) 40 MG tablet Take 1 tablet (40 mg total) by mouth 2 (two) times daily. 01/10/15  Yes Shaune Pascal Bensimhon, MD  potassium chloride SA (K-DUR,KLOR-CON) 20 MEQ tablet Take  2 tablets (40 mEq total) by mouth daily. And take extra 1 pill with lasix when needed. Patient taking differently: Take 20 mEq by mouth See admin instructions. Take 1 tablet (20 meq) by mouth twice daily/ take 2 extra tablets with each dose of Lasix 04/30/16  Yes Amy D Clegg, NP  PRESCRIPTION MEDICATION Inhale into the lungs at bedtime. CPAP   Yes Historical Provider, MD  simvastatin (ZOCOR) 80 MG tablet Take 0.5 tablets (40 mg total) by mouth at bedtime. 01/10/15  Yes Jolaine Artist, MD  tiotropium (SPIRIVA) 18 MCG inhalation capsule Place 1 capsule (18 mcg total) into inhaler and inhale daily. 01/10/15  Yes Jolaine Artist, MD  vitamin B-12 (CYANOCOBALAMIN) 1000 MCG tablet Take 1,000 mcg by mouth at bedtime.   Yes Historical Provider, MD  vitamin C (ASCORBIC ACID) 500 MG tablet Take 1 tablet (500 mg total) by mouth 2 (two) times daily. 01/10/15  Yes Jolaine Artist, MD   Allergies  Allergen Reactions  . Tape Other (See Comments)    Tape will tear with skin    FAMILY HISTORY:  family history includes Heart attack in his father and mother. SOCIAL HISTORY:  reports that he quit smoking about 15 years ago. His smoking use included Cigarettes. He has a 90.00 pack-year smoking history. He has never used smokeless tobacco. He reports that he does not drink alcohol or use drugs.  REVIEW OF SYSTEMS:   Unable to review as patient is intubated and sedated   SUBJECTIVE:  On mechanical ventilation and sedated.   VITAL SIGNS: Temp:  [96.6 F (35.9 C)-97.8 F (36.6 C)] 96.6 F (35.9 C) (04/08 1948)  Pulse Rate:  [80-144] 99 (04/08 2000) Resp:  [21-37] 28 (04/08 2044) BP: (41-166)/(0-132) 55/32 (04/08 2044) SpO2:  [89 %-100 %] 100 % (04/08 2000) Weight:  [97.8 kg (215 lb 9.8 oz)] 97.8 kg (215 lb 9.8 oz) (04/08 0436)  PHYSICAL EXAMINATION: General:  Adult male, no distress  Neuro:  Sedated, does not follow commands, pupils intact  HEENT:  ETT in place  Cardiovascular:  Tachy, LVAD in  place  Lungs:  Diminished breath sounds, no wheezes, crackles to bases, non-labored  Abdomen:  Non-distended, active bowel sounds  Musculoskeletal:  No acute  Skin:  Cool, dry, intact    Recent Labs Lab 01/24/2017 1913 01/18/2017 1932 01/05/17 0137 01/05/17 1036  NA 139 140 136 139  K 4.9 4.8 5.4* 5.4*  CL 107 107 106 109  CO2 19*  --  19* 21*  BUN 26* 31* 29* 34*  CREATININE 1.42* 1.40* 1.71* 2.38*  GLUCOSE 143* 148* 174* 151*    Recent Labs Lab 01/27/2017 1913 01/07/2017 1932 01/05/17 0137 01/05/17 1037  HGB 9.8* 10.9* 9.8* 9.4*  HCT 34.1* 32.0* 33.5* 30.8*  WBC 10.5  --  12.3* 13.6*  PLT 154  --  169 164   Ct Chest Wo Contrast  Result Date: 01/05/2017 CLINICAL DATA:  Acute on chronic systolic CHF. LVAD thrombosis and pump malfunction. Evaluate outflow graft for mechanical obstruction/kinking. No contrast with acute renal failure. EXAM: CT CHEST WITHOUT CONTRAST TECHNIQUE: Multidetector CT imaging of the chest was performed following the standard protocol without IV contrast. COMPARISON:  Chest CT dated 01/21/2013. Chest x-ray from earlier same day. FINDINGS: Cardiovascular: Left ventricular assist device appears stable in position, inflow cannula at the apex of the left ventricle, mildly angulated, stable in position and configuration. Left ventricular anastomosis and aortic anastomosis appears stable compared to the previous exam. No acute findings relative to the LVAD, although characterization is limited without IV contrast. Cardiomegaly appears stable. No pericardial effusion. No aortic aneurysm or dissection. Aortic atherosclerosis.  Coronary artery calcifications. Mediastinum/Nodes: Scattered small lymph nodes within the mediastinum. No enlarged or morphologically abnormal lymph nodes identified within the mediastinum or perihilar regions. Lungs/Pleura: Patchy consolidations at each lung base, most likely atelectasis. Mild interstitial edema bilaterally, perihilar and upper lobe  predominant. Mild emphysematous changes again noted, upper lobe predominant. Bilateral calcified pleural plaques again noted, presumably related to previous asbestos exposure. Upper Abdomen: Cirrhotic appearing liver. Cholelithiasis without evidence of acute cholecystitis, incompletely imaged. Small amount of free fluid/ascites within the upper abdomen. Musculoskeletal: No acute or suspicious osseous finding. Median sternotomy wires in place. IMPRESSION: 1. Compared to CT of 01/21/2013, LVAD appears stable in position and configuration. Surrounding soft tissues appear stable. 2. Cardiomegaly with perihilar and upper lobe edema indicating mild CHF/volume overload. 3. Patchy small consolidations at each lung base, likely atelectasis. 4. Small right pleural effusion and trace left pleural effusion. 5. Emphysematous changes, upper lobe predominant. 6. Aortic atherosclerosis. 7. Cirrhotic liver. 8. Cholelithiasis. 9. Small amount of free fluid/ascites in the upper abdomen. Electronically Signed   By: Franki Cabot M.D.   On: 01/05/2017 13:26   Dg Chest Port 1 View  Result Date: 01/05/2017 CLINICAL DATA:  Confirm line placement. History of atrial fibrillation, CHF, heart failure, COPD, hypertension, myocardial infarction, defibrillator. EXAM: PORTABLE CHEST 1 VIEW COMPARISON:  Chest x-ray dated 01/18/2017. FINDINGS: Right-sided PICC line has been placed with tip appropriately positioned at the level of the lower SVC. Cardiomegaly appears stable. Left ventricular assist device in place. Stable central pulmonary vascular  congestion and bilateral interstitial edema. Lungs are at least mildly hyperexpanded suggesting COPD. No pleural effusion or pneumothorax seen. IMPRESSION: 1. Right-sided PICC line in place with tip appropriately positioned at the level of the lower SVC. 2. Cardiomegaly with central pulmonary vascular congestion and bilateral interstitial edema suggesting CHF/volume overload, similar to yesterday's exam.  Electronically Signed   By: Franki Cabot M.D.   On: 01/05/2017 10:58   Dg Chest Port 1 View  Result Date: 01/10/2017 CLINICAL DATA:  Shortness of breath for 1 week. Ischemic cardiomyopathy. COPD. Left ventricular assist device. EXAM: PORTABLE CHEST 1 VIEW COMPARISON:  02/26/2016 FINDINGS: Cardiomegaly stable. Left ventricular assist device is again seen, as well as prior CABG. Pulmonary hyperinflation again noted. Increased pulmonary interstitial prominence is suspicious for mild interstitial edema. No evidence of pulmonary consolidation or pleural effusion. IMPRESSION: Increased pulmonary interstitial prominence, suspicious for mild interstitial edema. Stable cardiomegaly with left ventricular assist device. Electronically Signed   By: Earle Gell M.D.   On: 01/20/2017 19:02    ASSESSMENT / PLAN:  Acute Hypoxic Respiratory Failure in setting of LVAD thrombosis  H/O Emphysema, OSA (non-compliant with CPAP) Plan  -Vent Support  -Wean as tolerated  -Trend VBG (s/p t-pa) -CXR now  -Continue prn albuterol and scheduled Dulera   Cardiogenic Shock secondary to LVAD thrombosis  H/O CAD s/p CABG, PAF, systolic HF, HLD, HTN Plan  -Per Cardiology > Currently on Ammio gtt, Levophed, Epi, and Milrinone   Lactic Acidosis  Plan -Trend Lactic Acid  -Monitor WBC and fever curve   LVAD thrombosis s/p t-pa  H/O Gi bleed secondary to chronic anticoagulation  Plan -No stick for 24 hours  -Monitor for signs of bleeding  -t-pa low dose protocol per pharmacy   Acute Kidney Injury (Baseline Crt 0.8-1.2) -Now aneuric, Crt 1.4 > 1.7 > 3.8 Plan -Trend BMP -Replace electrolytes as needed  -Continue Bicarb gtt  -Will need Nephrology consult  Remaining management per primary team   CC Time: 32 minutes   Pulmonary and [place] Pager: [phone]  01/05/2017, 9:00 PM

## 2017-01-05 NOTE — Progress Notes (Signed)
CRITICAL VALUE ALERT  Critical value received:  Lactic acid 3.1  Date of notification:  01/05/17  Time of notification:  1150  Critical value read back:Yes.    Nurse who received alert:  Berenice Primas, RN  MD notified (1st page):  Dr. Haroldine Laws  Time of first page:  1200  MD notified (2nd page):  Time of second page:  Responding MD:  Dr. Haroldine Laws  Time MD responded: 1200

## 2017-01-05 NOTE — Progress Notes (Signed)
Spoke with pt about CPAP he states he does not wear regularly at home and does not want one here. I told him to call if he changes his mind

## 2017-01-05 NOTE — Progress Notes (Signed)
Bladder scan <20 ml; will cont. To monitor.  Ruben Reason

## 2017-01-05 NOTE — Progress Notes (Signed)
  Echocardiogram 2D Echocardiogram has been performed.  Jennette Dubin 01/05/2017, 12:12 PM

## 2017-01-05 NOTE — Progress Notes (Signed)
    ADVANCED HF/VAD CRITICAL CARE NOTE  Patient admitted with LVAD pump thrombosis with pump dysfunction and progressive cardiogenic shock.   As described in previous notes, the decision was made to give IV t-PA as no other viable option remained to try and save his life.   Under my guidance and after multiple discussions with the PharmD team, IV t-PA was administered to the patient starting around 430p with a 5mg  bolus followed by 3/mg/hr infusion. Due to lack of response and ongoing pump dysfunction/cardiogenic shock, another 5mg  bolus was given around 6pm.   I remained at the bedside continuously from 530p until 10p directing the patient's care and therapy.   This included ongoing titration of his vasoactive drips (epi, norepi, vasopressin) and administration of multiple rounds of intravenous bicarb for ongoing acidosis and also IV calcium.   During this period he also developed sustained ventricular tachycardia and progressive respiratory failure. He was treated with IV amiodarone which broke the VT.   Around 830p, his LVAD pump parameters had improved and it appeared the clot had been lysed - however he had developed progressive multi-system organ failure including anuric AKI and hypoxic respiratory failure. At that point, I discussed options with his family of proceeding with comfort care versus giving him 48-72 hours of aggressive support including intubation and possible temporary dialysis. They opted for the latter.   I paged the anesthesia team and he was intubated and we started versed and fentanyl drips for sedation. He developed profound hypotension post intubation with MAPs in the 30s and his vasopressors were again titrated personally.  Subsequently he developed a blood bowel movement with a hgb of 6.9 an 2u PRBCs were ordered.   Over the next 60 minutes he became somewhat more stable. I notified the CCM team of the situation and they arrived to manage the ventilator and assist  with his critical care management.   A little after 10pm I left the bedside with explicit instructions for the nursing team to page or call me with any issues.   Critical Care Time devoted to patient care services described in this note is 4.5 hours in addition to time already spent earlier in the day. This time reflects time of care of this signee, Dr. Pierre Bali who personally examined the patient and determined the plan of care. This critical care time does not reflect procedure time, teaching time or supervisory time of our PA/NP/resident but could involve care discussion and coordination time.  Glori Bickers, MD  11:50 PM

## 2017-01-05 NOTE — Progress Notes (Signed)
HeartMate 2 Rounding Note  Subjective:    Overnight LDH came back at 1,300 -> 1,700 confirming pump thrombosis. Minimal response to IV lasix.   Creatinine up to 1.7. Lactate 2.8. K 5.4,   Started on bivalirudin, norepinephrine and milrinone.   Feels bad this am. SOB. Restless. Nauseated. No low flow alarms   LVAD INTERROGATION:  HeartMate II LVAD:  Flow 3.5-4.4  liters/min, speed 9000, power 4.4, PI 3.0.  No PI events.   Objective:    Vital Signs:   Temp:  [97.4 F (36.3 C)-98 F (36.7 C)] 97.4 F (36.3 C) (04/08 0800) Pulse Rate:  [82-123] 112 (04/08 0900) Resp:  [21-37] 26 (04/08 0900) BP: (80-110)/(0-77) 93/73 (04/08 0900) SpO2:  [98 %-100 %] 99 % (04/08 0916) Weight:  [97.8 kg (215 lb 9.8 oz)-102.1 kg (225 lb)] 97.8 kg (215 lb 9.8 oz) (04/08 0436) Last BM Date: 01/26/2017 Mean arterial Pressure 70-80s  Intake/Output:   Intake/Output Summary (Last 24 hours) at 01/05/17 0949 Last data filed at 01/05/17 0800  Gross per 24 hour  Intake           265.52 ml  Output              195 ml  Net            70.52 ml     Physical Exam: General:  Ill appearing. Towel on head. Uncomfortable HEENT: normal anicteric Neck: supple. JVP to jaw . Carotids 2+ bilat; no bruits. No lymphadenopathy or thryomegaly appreciated. Cor: Mechanical heart sounds with LVAD hum present. LVAD sounds muffled. Heart sounds more prominent but no pump groan Lungs: basilar crackles  Abdomen: soft, nontender, nondistended. No hepatosplenomegaly. No bruits or masses. Good bowel sounds. Driveline: C/D/I; securement device intact and driveline incorporated Extremities: no cyanosis, clubbing, rash, pale but warmer than yesterday. No edema Neuro: alert & orientedx3, cranial nerves grossly intact. moves all 4 extremities w/o difficulty. Affect anxious  Telemetry: AF 100-120  Personally reviewed   Labs: Basic Metabolic Panel:  Recent Labs Lab 01/14/2017 1913 01/03/2017 1932 01/05/17 0137  NA 139 140  136  K 4.9 4.8 5.4*  CL 107 107 106  CO2 19*  --  19*  GLUCOSE 143* 148* 174*  BUN 26* 31* 29*  CREATININE 1.42* 1.40* 1.71*  CALCIUM 9.4  --  9.1    Liver Function Tests:  Recent Labs Lab 01/21/2017 1913  AST 128*  ALT 20  ALKPHOS 50  BILITOT 3.0*  PROT 6.7  ALBUMIN 3.4*   No results for input(s): LIPASE, AMYLASE in the last 168 hours. No results for input(s): AMMONIA in the last 168 hours.  CBC:  Recent Labs Lab 01/24/2017 1913 01/11/2017 1932 01/05/17 0137  WBC 10.5  --  12.3*  NEUTROABS 9.1*  --   --   HGB 9.8* 10.9* 9.8*  HCT 34.1* 32.0* 33.5*  MCV 100.3*  --  99.4  PLT 154  --  169    INR: No results for input(s): INR in the last 168 hours.  Other results:    Imaging: Dg Chest Port 1 View  Result Date: 01/10/2017 CLINICAL DATA:  Shortness of breath for 1 week. Ischemic cardiomyopathy. COPD. Left ventricular assist device. EXAM: PORTABLE CHEST 1 VIEW COMPARISON:  02/26/2016 FINDINGS: Cardiomegaly stable. Left ventricular assist device is again seen, as well as prior CABG. Pulmonary hyperinflation again noted. Increased pulmonary interstitial prominence is suspicious for mild interstitial edema. No evidence of pulmonary consolidation or pleural effusion. IMPRESSION: Increased  pulmonary interstitial prominence, suspicious for mild interstitial edema. Stable cardiomegaly with left ventricular assist device. Electronically Signed   By: Earle Gell M.D.   On: 01/11/2017 19:02      Medications:     Scheduled Medications: . atorvastatin  40 mg Oral q1800  . citalopram  40 mg Oral QHS  . docusate sodium  200 mg Oral BID  . levothyroxine  100 mcg Oral QHS  . mometasone-formoterol  2 puff Inhalation BID  . multivitamin with minerals  1 tablet Oral QHS  . pantoprazole  40 mg Oral BID  . tiotropium  18 mcg Inhalation Daily     Infusions: . bivalirudin (ANGIOMAX) infusion 0.5 mg/mL (Non-ACS indications) 0.1 mg/kg/hr (01/05/17 0800)  . milrinone 0.125  mcg/kg/min (01/05/17 0817)  . norepinephrine (LEVOPHED) Adult infusion 3.013 mcg/min (01/05/17 0800)     PRN Medications:  acetaminophen, albuterol, ondansetron (ZOFRAN) IV   Assessment:   1. 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 Chronic AF with RVR 5. CAD s/p CABG 2003 6. h/o symptomatic anemia with multiple GI bleeds    --Followed in hematology clinic for IV iron and blood transfusions as needed. Last transfusion 12/06/16.  7. AKI    --creatinine 1.2->1.4-> 1.7  Plan/Discussion:    He is in cardiogenic shock due to LVAD thrombosis with markedly elevated LDH and lactic acidosis. He seems a bit better today. Extremities are warmer. FLow better on pump. Will place PICC line now and then get repeat labs including LDH, lactate and BMET. Will also get echo to look for LV unloading and non-contrast CT to evaluate outflow graft.  If labs are stabilizing will continue current course with bival and inotropes. If getting worse will need to discuss options including   1) IABP for ongoing support while continuing bivalirudin 2) low-dose t-pa protocol 3) emergent transfer for evaluation for pump exchange.   Await results of studies to guide further decision making.   Plan of car discussed with LVAD coordinators and PharmD    Critical Care Time devoted to patient care services described in this note is 38 Minutes. This time reflects time of care of this signee, Dr. Pierre Bali who personally examined the patient and determined the plan of care. This critical care time does not reflect procedure time, teaching time or supervisory time of our PA/NP/resident but could involve care discussion and coordination time.   I reviewed the LVAD parameters from today, and compared the results to the patient's prior recorded data.  No programming changes  were made.  The LVAD is functioning within specified parameters.  The patient performs LVAD self-test daily.  LVAD interrogation was negative for any significant power changes, alarms or PI events/speed drops.  LVAD equipment check completed and is in good working order.  Back-up equipment present.   LVAD education done on emergency procedures and precautions and reviewed exit site care.  Length of Stay: 1  Mayleen Borrero MD 01/05/2017, 9:49 AM  VAD Team --- VAD ISSUES ONLY--- Pager [phone] (7am - 7am)  Advanced Heart Failure Team  Pager [phone] (M-F; 7a - 4p)  Please contact [facility] for night-coverage after hours (4p -7a ) and weekends on [email or web address]

## 2017-01-06 ENCOUNTER — Inpatient Hospital Stay (HOSPITAL_COMMUNITY): Payer: Non-veteran care

## 2017-01-06 ENCOUNTER — Telehealth: Payer: Self-pay | Admitting: Hematology

## 2017-01-06 DIAGNOSIS — I472 Ventricular tachycardia: Secondary | ICD-10-CM

## 2017-01-06 DIAGNOSIS — K922 Gastrointestinal hemorrhage, unspecified: Secondary | ICD-10-CM

## 2017-01-06 DIAGNOSIS — N179 Acute kidney failure, unspecified: Secondary | ICD-10-CM

## 2017-01-06 LAB — BASIC METABOLIC PANEL
ANION GAP: 13 (ref 5–15)
ANION GAP: 13 (ref 5–15)
ANION GAP: 21 — AB (ref 5–15)
Anion gap: 19 — ABNORMAL HIGH (ref 5–15)
Anion gap: 11 (ref 5–15)
Anion gap: 13 (ref 5–15)
BUN: 35 mg/dL — AB (ref 6–20)
BUN: 41 mg/dL — AB (ref 6–20)
BUN: 41 mg/dL — AB (ref 6–20)
BUN: 49 mg/dL — AB (ref 6–20)
BUN: 50 mg/dL — AB (ref 6–20)
BUN: 51 mg/dL — AB (ref 6–20)
CALCIUM: 7.8 mg/dL — AB (ref 8.9–10.3)
CHLORIDE: 91 mmol/L — AB (ref 101–111)
CHLORIDE: 92 mmol/L — AB (ref 101–111)
CO2: 18 mmol/L — ABNORMAL LOW (ref 22–32)
CO2: 28 mmol/L (ref 22–32)
CO2: 30 mmol/L (ref 22–32)
CO2: 23 mmol/L (ref 22–32)
CO2: 25 mmol/L (ref 22–32)
CO2: 30 mmol/L (ref 22–32)
CREATININE: 3.97 mg/dL — AB (ref 0.61–1.24)
CREATININE: 3.93 mg/dL — AB (ref 0.61–1.24)
Calcium: 8.3 mg/dL — ABNORMAL LOW (ref 8.9–10.3)
Calcium: 8.7 mg/dL — ABNORMAL LOW (ref 8.9–10.3)
Calcium: 8.8 mg/dL — ABNORMAL LOW (ref 8.9–10.3)
Calcium: 8.8 mg/dL — ABNORMAL LOW (ref 8.9–10.3)
Calcium: 8 mg/dL — ABNORMAL LOW (ref 8.9–10.3)
Chloride: 95 mmol/L — ABNORMAL LOW (ref 101–111)
Chloride: 98 mmol/L — ABNORMAL LOW (ref 101–111)
Chloride: 98 mmol/L — ABNORMAL LOW (ref 101–111)
Chloride: 97 mmol/L — ABNORMAL LOW (ref 101–111)
Creatinine, Ser: 3.35 mg/dL — ABNORMAL HIGH (ref 0.61–1.24)
Creatinine, Ser: 4.68 mg/dL — ABNORMAL HIGH (ref 0.61–1.24)
Creatinine, Ser: 5.08 mg/dL — ABNORMAL HIGH (ref 0.61–1.24)
Creatinine, Ser: 5.13 mg/dL — ABNORMAL HIGH (ref 0.61–1.24)
GFR calc Af Amer: 19 mL/min — ABNORMAL LOW (ref >60)
GFR calc Af Amer: 16 mL/min — ABNORMAL LOW (ref >60)
GFR calc Af Amer: 16 mL/min — ABNORMAL LOW (ref >60)
GFR calc Af Amer: 13 mL/min — ABNORMAL LOW (ref >60)
GFR calc Af Amer: 12 mL/min — ABNORMAL LOW (ref >60)
GFR calc Af Amer: 11 mL/min — ABNORMAL LOW (ref >60)
GFR calc non Af Amer: 14 mL/min — ABNORMAL LOW (ref >60)
GFR calc non Af Amer: 11 mL/min — ABNORMAL LOW (ref >60)
GFR calc non Af Amer: 10 mL/min — ABNORMAL LOW (ref >60)
GFR calc non Af Amer: 10 mL/min — ABNORMAL LOW (ref >60)
GFR, EST NON AFRICAN AMERICAN: 14 mL/min — AB (ref >60)
GFR, EST NON AFRICAN AMERICAN: 17 mL/min — AB (ref >60)
GLUCOSE: 418 mg/dL — AB (ref 65–99)
GLUCOSE: 365 mg/dL — AB (ref 65–99)
GLUCOSE: 174 mg/dL — AB (ref 65–99)
GLUCOSE: 192 mg/dL — AB (ref 65–99)
GLUCOSE: 379 mg/dL — AB (ref 65–99)
Glucose, Bld: 623 mg/dL (ref 65–99)
POTASSIUM: 2.4 mmol/L — AB (ref 3.5–5.1)
POTASSIUM: 4.1 mmol/L (ref 3.5–5.1)
POTASSIUM: 4.2 mmol/L (ref 3.5–5.1)
POTASSIUM: 4.3 mmol/L (ref 3.5–5.1)
POTASSIUM: 4.4 mmol/L (ref 3.5–5.1)
Potassium: 3.5 mmol/L (ref 3.5–5.1)
SODIUM: 131 mmol/L — AB (ref 135–145)
SODIUM: 134 mmol/L — AB (ref 135–145)
SODIUM: 137 mmol/L (ref 135–145)
SODIUM: 141 mmol/L (ref 135–145)
Sodium: 140 mmol/L (ref 135–145)
Sodium: 132 mmol/L — ABNORMAL LOW (ref 135–145)

## 2017-01-06 LAB — GLUCOSE, CAPILLARY
GLUCOSE-CAPILLARY: 223 mg/dL — AB (ref 65–99)
GLUCOSE-CAPILLARY: 177 mg/dL — AB (ref 65–99)
Glucose-Capillary: 178 mg/dL — ABNORMAL HIGH (ref 65–99)
Glucose-Capillary: 155 mg/dL — ABNORMAL HIGH (ref 65–99)
Glucose-Capillary: 190 mg/dL — ABNORMAL HIGH (ref 65–99)

## 2017-01-06 LAB — POCT I-STAT 3, VENOUS BLOOD GAS (G3P V)
ACID-BASE DEFICIT: 3 mmol/L — AB (ref 0.0–2.0)
Bicarbonate: 23 mmol/L (ref 20.0–28.0)
O2 SAT: 71 %
PCO2 VEN: 45.7 mmHg (ref 44.0–60.0)
TCO2: 24 mmol/L (ref 0–100)
pH, Ven: 7.309 (ref 7.250–7.430)
pO2, Ven: 41 mmHg (ref 32.0–45.0)

## 2017-01-06 LAB — HEPARIN LEVEL (UNFRACTIONATED)
HEPARIN UNFRACTIONATED: 1.42 [IU]/mL — AB (ref 0.30–0.70)
HEPARIN UNFRACTIONATED: 1.08 [IU]/mL — AB (ref 0.30–0.70)

## 2017-01-06 LAB — HEPATIC FUNCTION PANEL
ALT: 804 U/L — ABNORMAL HIGH (ref 17–63)
AST: 2471 U/L — ABNORMAL HIGH (ref 15–41)
Albumin: 2.8 g/dL — ABNORMAL LOW (ref 3.5–5.0)
Alkaline Phosphatase: 54 U/L (ref 38–126)
BILIRUBIN DIRECT: 4.4 mg/dL — AB (ref 0.1–0.5)
BILIRUBIN INDIRECT: 2.7 mg/dL — AB (ref 0.3–0.9)
BILIRUBIN TOTAL: 7.1 mg/dL — AB (ref 0.3–1.2)
Total Protein: 5.4 g/dL — ABNORMAL LOW (ref 6.5–8.1)

## 2017-01-06 LAB — CBC
HCT: 33.6 % — ABNORMAL LOW (ref 39.0–52.0)
HEMATOCRIT: 32.6 % — AB (ref 39.0–52.0)
HEMOGLOBIN: 10.3 g/dL — AB (ref 13.0–17.0)
Hemoglobin: 9.6 g/dL — ABNORMAL LOW (ref 13.0–17.0)
MCH: 29.1 pg (ref 26.0–34.0)
MCH: 29.9 pg (ref 26.0–34.0)
MCHC: 29.4 g/dL — AB (ref 30.0–36.0)
MCHC: 30.7 g/dL (ref 30.0–36.0)
MCV: 98.8 fL (ref 78.0–100.0)
MCV: 97.4 fL (ref 78.0–100.0)
Platelets: 160 10*3/uL (ref 150–400)
Platelets: 155 10*3/uL (ref 150–400)
RBC: 3.3 MIL/uL — ABNORMAL LOW (ref 4.22–5.81)
RBC: 3.45 MIL/uL — ABNORMAL LOW (ref 4.22–5.81)
RDW: 19 % — AB (ref 11.5–15.5)
RDW: 19.4 % — ABNORMAL HIGH (ref 11.5–15.5)
WBC: 22.9 10*3/uL — ABNORMAL HIGH (ref 4.0–10.5)
WBC: 22.4 10*3/uL — ABNORMAL HIGH (ref 4.0–10.5)

## 2017-01-06 LAB — FIBRINOGEN
FIBRINOGEN: 337 mg/dL (ref 210–475)
Fibrinogen: 272 mg/dL (ref 210–475)

## 2017-01-06 LAB — COOXEMETRY PANEL
Carboxyhemoglobin: 2.2 % — ABNORMAL HIGH (ref 0.5–1.5)
Methemoglobin: 1.6 % — ABNORMAL HIGH (ref 0.0–1.5)
O2 Saturation: 76.2 %
TOTAL HEMOGLOBIN: 11 g/dL — AB (ref 12.0–16.0)

## 2017-01-06 LAB — LACTATE DEHYDROGENASE
LDH: 3776 U/L — AB (ref 98–192)
LDH: 4954 U/L — AB (ref 98–192)

## 2017-01-06 LAB — MAGNESIUM
MAGNESIUM: 1.5 mg/dL — AB (ref 1.7–2.4)
MAGNESIUM: 2.5 mg/dL — AB (ref 1.7–2.4)
Magnesium: 1.5 mg/dL — ABNORMAL LOW (ref 1.7–2.4)

## 2017-01-06 LAB — PREPARE RBC (CROSSMATCH)

## 2017-01-06 LAB — LACTIC ACID, PLASMA: Lactic Acid, Venous: 2.4 mmol/L (ref 0.5–1.9)

## 2017-01-06 LAB — MRSA PCR SCREENING: MRSA by PCR: NEGATIVE

## 2017-01-06 LAB — APTT
APTT: 161 s — AB (ref 24–36)
aPTT: 119 seconds — ABNORMAL HIGH (ref 24–36)

## 2017-01-06 MED ORDER — ARFORMOTEROL TARTRATE 15 MCG/2ML IN NEBU
15.0000 ug | INHALATION_SOLUTION | Freq: Two times a day (BID) | RESPIRATORY_TRACT | Status: DC
  Administered 2017-01-06 – 2017-01-07 (×3): 15 ug via RESPIRATORY_TRACT
  Filled 2017-01-06 (×4): qty 2

## 2017-01-06 MED ORDER — FUROSEMIDE 10 MG/ML IJ SOLN
160.0000 mg | Freq: Four times a day (QID) | INTRAVENOUS | Status: DC
  Administered 2017-01-06 – 2017-01-07 (×4): 160 mg via INTRAVENOUS
  Filled 2017-01-06 (×4): qty 16
  Filled 2017-01-06: qty 2
  Filled 2017-01-06: qty 16

## 2017-01-06 MED ORDER — AMIODARONE IV BOLUS ONLY 150 MG/100ML
150.0000 mg | Freq: Once | INTRAVENOUS | Status: AC
  Administered 2017-01-06: 150 mg via INTRAVENOUS

## 2017-01-06 MED ORDER — AMIODARONE LOAD VIA INFUSION
300.0000 mg | Freq: Once | INTRAVENOUS | Status: AC
  Administered 2017-01-06: 300 mg via INTRAVENOUS
  Filled 2017-01-06: qty 166.67

## 2017-01-06 MED ORDER — HEPARIN (PORCINE) IN NACL 100-0.45 UNIT/ML-% IJ SOLN
950.0000 [IU]/h | INTRAMUSCULAR | Status: DC
  Administered 2017-01-07: 1000 [IU]/h via INTRAVENOUS
  Filled 2017-01-06 (×2): qty 250

## 2017-01-06 MED ORDER — ORAL CARE MOUTH RINSE
15.0000 mL | OROMUCOSAL | Status: DC
  Administered 2017-01-06 – 2017-01-08 (×19): 15 mL via OROMUCOSAL

## 2017-01-06 MED ORDER — HEPARIN (PORCINE) IN NACL 100-0.45 UNIT/ML-% IJ SOLN: 1050.0000 [IU]/h | INTRAMUSCULAR | Status: DC

## 2017-01-06 MED ORDER — MIDAZOLAM BOLUS VIA INFUSION
1.0000 mg | INTRAVENOUS | Status: DC | PRN
  Filled 2017-01-06: qty 2

## 2017-01-06 MED ORDER — CHLORHEXIDINE GLUCONATE 0.12% ORAL RINSE (MEDLINE KIT)
15.0000 mL | Freq: Two times a day (BID) | OROMUCOSAL | Status: DC
  Administered 2017-01-06 (×2): 15 mL via OROMUCOSAL

## 2017-01-06 MED ORDER — ACETAMINOPHEN 160 MG/5ML PO SOLN
650.0000 mg | Freq: Four times a day (QID) | ORAL | Status: DC | PRN
  Administered 2017-01-07: 650 mg
  Filled 2017-01-06: qty 20.3

## 2017-01-06 MED ORDER — SODIUM CHLORIDE 0.9 % IV SOLN
Freq: Once | INTRAVENOUS | Status: AC
  Administered 2017-01-06: 03:00:00 via INTRAVENOUS

## 2017-01-06 MED ORDER — BUDESONIDE 0.25 MG/2ML IN SUSP
0.2500 mg | Freq: Two times a day (BID) | RESPIRATORY_TRACT | Status: DC
  Administered 2017-01-06 – 2017-01-07 (×3): 0.25 mg via RESPIRATORY_TRACT
  Filled 2017-01-06 (×4): qty 2

## 2017-01-06 MED ORDER — MIDAZOLAM HCL 5 MG/ML IJ SOLN
0.0000 mg/h | INTRAMUSCULAR | Status: DC
  Administered 2017-01-07: 0.5 mg/h via INTRAVENOUS
  Administered 2017-01-08: 1 mg/h via INTRAVENOUS
  Filled 2017-01-06 (×2): qty 10

## 2017-01-06 MED ORDER — ORAL CARE MOUTH RINSE
15.0000 mL | Freq: Four times a day (QID) | OROMUCOSAL | Status: DC
  Administered 2017-01-06: 15 mL via OROMUCOSAL

## 2017-01-06 MED ORDER — INSULIN ASPART 100 UNIT/ML ~~LOC~~ SOLN
1.0000 [IU] | SUBCUTANEOUS | Status: DC
  Administered 2017-01-06 – 2017-01-07 (×6): 2 [IU] via SUBCUTANEOUS
  Administered 2017-01-07: 3 [IU] via SUBCUTANEOUS
  Administered 2017-01-07: 2 [IU] via SUBCUTANEOUS
  Administered 2017-01-07 – 2017-01-08 (×2): 3 [IU] via SUBCUTANEOUS
  Administered 2017-01-08: 2 [IU] via SUBCUTANEOUS

## 2017-01-06 MED ORDER — INSULIN ASPART 100 UNIT/ML ~~LOC~~ SOLN
0.0000 [IU] | Freq: Three times a day (TID) | SUBCUTANEOUS | Status: DC
  Administered 2017-01-06: 2 [IU] via SUBCUTANEOUS

## 2017-01-06 MED ORDER — FENTANYL 2500MCG IN NS 250ML (10MCG/ML) PREMIX INFUSION
25.0000 ug/h | INTRAVENOUS | Status: DC
  Administered 2017-01-08: 50 ug/h via INTRAVENOUS
  Filled 2017-01-06: qty 250

## 2017-01-06 MED ORDER — CHLORHEXIDINE GLUCONATE 0.12% ORAL RINSE (MEDLINE KIT)
15.0000 mL | Freq: Two times a day (BID) | OROMUCOSAL | Status: DC
  Administered 2017-01-06 – 2017-01-07 (×3): 15 mL via OROMUCOSAL

## 2017-01-06 MED ORDER — MAGNESIUM SULFATE 4 GM/100ML IV SOLN
4.0000 g | Freq: Once | INTRAVENOUS | Status: AC
  Administered 2017-01-06: 4 g via INTRAVENOUS
  Filled 2017-01-06: qty 100

## 2017-01-06 MED ORDER — ACETAMINOPHEN 650 MG RE SUPP
650.0000 mg | RECTAL | Status: DC | PRN
Start: 2017-01-06 — End: 2017-01-08

## 2017-01-06 MED ORDER — POTASSIUM CHLORIDE 2 MEQ/ML IV SOLN
Freq: Once | INTRAVENOUS | Status: AC
  Administered 2017-01-06: 04:00:00 via INTRAVENOUS
  Filled 2017-01-06: qty 500

## 2017-01-06 MED ORDER — FENTANYL BOLUS VIA INFUSION
25.0000 ug | INTRAVENOUS | Status: DC | PRN
  Filled 2017-01-06: qty 25

## 2017-01-06 MED ORDER — AMIODARONE HCL IN DEXTROSE 360-4.14 MG/200ML-% IV SOLN
60.0000 mg/h | INTRAVENOUS | Status: DC
  Administered 2017-01-06 – 2017-01-08 (×8): 60 mg/h via INTRAVENOUS
  Filled 2017-01-06 (×8): qty 200

## 2017-01-06 NOTE — Progress Notes (Addendum)
Initial Nutrition Assessment  DOCUMENTATION CODES:   Not applicable  INTERVENTION:  If unable to extubated, recommend TF (pending goals of care) with Vital AF 1.2 at goal rate of 70 ml/h (1680 ml per day) to provide 2016 kcals, 126 gm protein, 1361 ml free water daily.  NUTRITION DIAGNOSIS:   Inadequate oral intake related to inability to eat as evidenced by NPO status.  GOAL:   Patient will meet greater than or equal to 90% of their needs  MONITOR:   Vent status, Labs, Weight trends, Skin, I & O's  REASON FOR ASSESSMENT:   Ventilator    ASSESSMENT:   76 year old man with chronic systolic CHF s/p LVAD complicated by anemia, OSA on CPAP, emphysema hospitalized for LVAD thrombosis s/p tPA complicated by cardiogenic shock, acute hypoxic respiratory failure on ventilatory support, oliguric acute renal failure.  Patient is currently intubated on ventilator support MV: 12 L/min Temp (24hrs), Avg:98.7 F (37.1 C), Min:96.6 F (35.9 C), Max:99.9 F (37.7 C)  Propofol: none  Per MD note, plans to continue resuscitative efforts for next 48 hours, if no improvement, then likely transition to comfort measures. Weight has been trending down since admission. Noted pt is on IV diuresis. Pt with no observed significant fat or muscle mass loss.   Labs and mediations reviewed. Magnesium elevated at 2.5.  Diet Order:  Diet NPO time specified  Skin:  Reviewed, no issues  Last BM:  4/8  Height:   Ht Readings from Last 1 Encounters:  01/26/2017 6\' 1"  (1.854 m)    Weight:   Wt Readings from Last 1 Encounters:  01/06/17 218 lb 14.7 oz (99.3 kg)  Admit weight (4/7) 225 lb (102.1 kg)  Ideal Body Weight:  83.6 kg  BMI:  Body mass index is 28.88 kg/m.  Estimated Nutritional Needs:   Kcal:  2071  Protein:  120-140 grams  Fluid:  Per MD  EDUCATION NEEDS:   No education needs identified at this time  Corrin Parker, MS, RD, LDN Pager # [phone] After hours/ weekend  pager # [phone]

## 2017-01-06 NOTE — Progress Notes (Signed)
CRITICAL VALUE ALERT  Critical value received:  K 2.4, lactic 11.4  Date of notification:  01/06/2017  Time of notification:  0320  Critical value read back:yes  Nurse who received alert:  Purcell Nails  MD notified (1st page):  yes  Time of first page:  0321  MD notified (2nd page):  Time of second page:  Responding MD: [place]  Time MD responded:  [phone]

## 2017-01-06 NOTE — Progress Notes (Signed)
ANTICOAGULATION CONSULT NOTE - Follow Up Consult  Pharmacy Consult for heparin Indication: LVAD clot  Labs:  Recent Labs  01/05/17 1430  01/05/17 2145 01/06/17 0003 01/06/17 0500 01/06/17 0830 01/06/17 0913 01/06/17 0946  HGB  --   < > 6.9*  --  9.6*  --  10.3*  --   HCT  --   < > 24.7*  --  32.6*  --  33.6*  --   PLT  --   --  160  --  160  --  155  --   APTT >200*  --  >200*  --   --  161*  --   --   HEPARINUNFRC  --   --  1.42*  --   --  1.08*  --   --   CREATININE  --   < >  --  3.35* 3.97*  --   --  3.93*  < > = values in this interval not displayed.  Assessment:  76yo male with LVAD admitted with probable pump thrombosis. APTT was 80 > therapeutic on bivalirudin with initial dosing while Eliquis on hold. Bivalirudin drip was moved from peripheral IV site to PICC line and aptt drawn from PICC line as well at 1430 so inaccurate (> 200). Bivalirudin gtt changed to heparin drip since TPA started at 3mg /hr to break up clot. TPA ran for approximately 10 hours  Hgb up to 10 this morning after PRBC x 2 units 4/8.   APTT remains high at 161, tpa likely cleared and not having much effect and heparin level 1.08. Indicating clearance of eliquis and closer to correlating true heparin level. Noted plan to treat aggressively for 48-72 hours then consider comfort care.  Goal: Heparin level 0.3-0.7 units/hr (PTT 66-102)  Monitor platelets per [facility] protocol  Plan: Decrease heparin drip to 1050 uts/hr Will f/u 6 hr aPTT  Erin Hearing PharmD., BCPS Clinical Pharmacist Pager [phone] 01/06/2017 11:39 AM

## 2017-01-06 NOTE — Progress Notes (Signed)
Dr. Aundra Dubin in unit and made aware of heart rate in 130's.  EKG done and order for Amiodarone 132mcg bolus ordered for AFib with RVR.  Amiodarone bolus given as per orders.

## 2017-01-06 NOTE — Progress Notes (Signed)
Per Dr. Haroldine Laws, pause sedation for 30 minutes, and then ask [name] if he wishes to proceed with CRRT for a short period of time. Sedation paused at 2215. At 2300, went into room, called his name, patient opened his eyes wide. Explained to him about the need for short term CRRT to try to help his kidney function. I asked him if would be ok with CRRT for the short term. He clearly nodded his head yes. [name] (his wife) present at bedside for entire conversation, and witnessed him nodding his head yes in consent for short term CRRT. Dr. Haroldine Laws called and made aware. Sedation restarted at previous rate after speaking to Dr. Haroldine Laws. Will continue to monitor. Richarda Blade RN

## 2017-01-06 NOTE — Progress Notes (Signed)
CRITICAL VALUE ALERT  Critical value received:  Lactic acid 2.4  Date of notification:  01/06/2017  Time of notification:  1400  Critical value read back:Yes.    Nurse who received alert:  Beckie Salts, RN  MD notified (1st page):  Dr. Pierre Bali  Time of first page:  1400  MD notified (2nd page):  Time of second page:  Responding MD:  Dr. Pierre Bali  Time MD responded:  [phone]

## 2017-01-06 NOTE — Progress Notes (Signed)
PULMONARY  / CRITICAL CARE MEDICINE  Name: [name] MRN: [id number] DOB: 12/21/1940    LOS: 2  REFERRING MD :  Dr. Haroldine Laws  CHIEF COMPLAINT:  Respiratory distress  BRIEF PATIENT DESCRIPTION: Mr. Coolman is a  76 year old man with chronic systolic CHF s/p LVAD complicated by anemia, OSA on CPAP, emphysema hospitalized for LVAD thrombosis s/p tPA complicated by cardiogenic shock, acute hypoxic respiratory failure on ventilatory support, oliguric acute renal failure.  HISTORY OF PRESENT ILLNESS:  Information obtained from medical record as patient is intubated and sedated. 76 year old male with PMH of anemia, A.Fib, CAD s/p CABG (2003), liver cirrhosis, chronic systolic HF s/p LVAD 10/5954 (complicated by recurrent anemia, switched from coumadin to Eliquis), emphysema, OSA (on CPAP). Patient presents 4/7 with reported dyspnea and 5 lbs weight gain. On arrival to ED CXR revealed mild pulmonary edema, cardiology concerned with pump dysfunction as parameters seemed disproportioned to CXR. 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 Started on Low dose t-pa protocol. Later in the day was tachycardiac, tachpneic, and diaphoretic, developed nausea, was intubated. PCCM asked to consult for vent management.    INTERVAL HISTORY: No events this morning, Repeat Mg 1.5 though was before he received Mg supplementation. K improved to 4.2 with supplementation.  PAST MEDICAL HISTORY :  Past Medical History:  Diagnosis Date  . Anemia   . Asbestosis(501)    "6 years in the [place]" (05/24/2013)  . Atrial fibrillation ([place])    permanent  . Cancer ([place])    "scraped some off behind my left ear; fast moving; having it cut out 12/05/2015" (11/07/2015)  . CHF (congestive heart failure) ([place])   . Chronic anticoagulation    Afib and LVAD  . Chronic systolic heart failure ([place])   . 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 Complication of anesthesia    "they can't put me all the way to  sleep cause of my heart" (11/07/2015)  . COPD (chronic obstructive pulmonary disease) ([place])   . Coronary artery disease   . Depression   . Epistaxis 11/2015, 07/2014  . History of blood transfusion 08/2015 X 10; 11/2015 X 2   "related to bleeding on the inside somewhere" (11/07/2015)  . Hyperlipidemia   . Hypertension   . Hypothyroidism   . Ischemic cardiomyopathy     CABG 2003, PCI 2007  EF 27%(myoview 2012  . LVAD (left ventricular assist device) present ([place]) 12/2012  . Myocardial infarction 1990's-2000   "2 in  ~ the 1990's; 1 in ~ 2000" (11/07/2015)  . Obesity   . On home oxygen therapy    "have it available; don't use it" (11/07/2015)  . OSA on CPAP    no longer on cpap  . Paroxysmal ventricular tachycardia ([place])   . Restless leg syndrome   . Shortness of breath dyspnea    Past Surgical History:  Procedure Laterality Date  . APPENDECTOMY    . CARDIAC DEFIBRILLATOR PLACEMENT  2004; ~ 2010   "cut it out in 2016 after it got infected"   . COLONOSCOPY N/A 03/09/2014   Procedure: COLONOSCOPY;  Surgeon: Inda Castle, MD;  Location: [place];  Service: Endoscopy;  Laterality: N/A;  LVAD  patient  . COLONOSCOPY N/A 07/29/2014   Procedure: COLONOSCOPY;  Surgeon: Gatha Mayer, MD;  Location: [place];  Service: Endoscopy;  Laterality: N/A;  . CORONARY ARTERY BYPASS GRAFT  2003   "?X4" (05/24/2013)  .  ENTEROSCOPY N/A 07/27/2014   Procedure: ENTEROSCOPY;  Surgeon: Gatha Mayer, MD;  Location: [facility] ENDOSCOPY;  Service: Endoscopy;  Laterality: N/A;  LVAD patient   . ENTEROSCOPY N/A 09/27/2014   Procedure: ENTEROSCOPY;  Surgeon: Gatha Mayer, MD;  Location: [facility] ENDOSCOPY;  Service: Endoscopy;  Laterality: N/A;  . ENTEROSCOPY N/A 11/09/2015   Procedure: ENTEROSCOPY;  Surgeon: Mauri Pole, MD;  Location: [place] ENDOSCOPY;  Service: Endoscopy;  Laterality: N/A;  LVAD  . ENTEROSCOPY N/A 12/13/2015   Procedure: ENTEROSCOPY;  Surgeon: Milus Banister, MD;  Location: [place];   Service: Endoscopy;  Laterality: N/A;  . ENTEROSCOPY N/A 02/21/2016   Procedure: ENTEROSCOPY;  Surgeon: Milus Banister, MD;  Location: [place];  Service: Endoscopy;  Laterality: N/A;  . ERCP N/A 06/13/2016   Procedure: ENDOSCOPIC RETROGRADE CHOLANGIOPANCREATOGRAPHY (ERCP);  Surgeon: Milus Banister, MD;  Location: [place];  Service: Endoscopy;  Laterality: N/A;  . ESOPHAGOGASTRODUODENOSCOPY N/A 03/09/2014   Procedure: ESOPHAGOGASTRODUODENOSCOPY (EGD);  Surgeon: Inda Castle, MD;  Location: [place];  Service: Endoscopy;  Laterality: N/A;  . ESOPHAGOGASTRODUODENOSCOPY N/A 08/01/2014   Procedure: ESOPHAGOGASTRODUODENOSCOPY (EGD);  Surgeon: Jerene Bears, MD;  Location: [facility] ENDOSCOPY;  Service: Endoscopy;  Laterality: N/A;  LVAD patient  . GIVENS CAPSULE STUDY N/A 03/10/2014   Procedure: GIVENS CAPSULE STUDY;  Surgeon: Inda Castle, MD;  Location: [place];  Service: Endoscopy;  Laterality: N/A;  . GIVENS CAPSULE STUDY N/A 07/30/2014   Procedure: GIVENS CAPSULE STUDY;  Surgeon: Gatha Mayer, MD;  Location: [place];  Service: Endoscopy;  Laterality: N/A;  . GIVENS CAPSULE STUDY N/A 12/11/2015   Procedure: GIVENS CAPSULE STUDY;  Surgeon: Gatha Mayer, MD;  Location: [place];  Service: Endoscopy;  Laterality: N/A;  . ICD LEAD REMOVAL Left 03/16/2015   Procedure: ICD LEAD REMOVAL/EXTRACTION;  Surgeon: Evans Lance, MD;  Location: [place];  Service: Cardiovascular;  Laterality: Left;  PATIENT HAS LVAD  DR. VAN TRIGT TO BACK UP EXTRACTION  . INSERTION OF IMPLANTABLE LEFT VENTRICULAR ASSIST DEVICE N/A 01/12/2013   Procedure: INSERTION OF IMPLANTABLE LEFT VENTRICULAR ASSIST DEVICE;  Surgeon: Ivin Poot, MD;  Location: [place];  Service: Open Heart Surgery;  Laterality: N/A;   nitric oxide; Redo sternotomy  . INTRAOPERATIVE TRANSESOPHAGEAL ECHOCARDIOGRAM N/A 01/12/2013   Procedure: INTRAOPERATIVE TRANSESOPHAGEAL ECHOCARDIOGRAM;  Surgeon: Ivin Poot, MD;  Location: [place];   Service: Open Heart Surgery;  Laterality: N/A;  . IR GENERIC HISTORICAL  07/23/2016   IR SINUS/FIST TUBE CHK-NON GI 07/23/2016 Arne [place], MD [facility]INTER[facility] RAD  . IR GENERIC HISTORICAL  05/14/2016   IR RADIOLOGIST EVAL & MGMT 05/14/2016 [facility] INTERV RAD  . LEFT AND RIGHT HEART CATHETERIZATION WITH CORONARY/GRAFT ANGIOGRAM  01/07/2013   Procedure: LEFT AND RIGHT HEART CATHETERIZATION WITH Beatrix Fetters;  Surgeon: Jolaine Artist, MD;  Location[facility] C[facility] LAB;  Service: Cardiovascular;;   Prior to Admission medications   Medication Sig Start Date End Date Taking? Authorizing Provider  albuterol (PROVENTIL) (2.5 MG/3ML) 0.083% nebulizer solution Take 2.5 mg by nebulization every 6 (six) hours as needed for wheezing or shortness of breath.   Yes Historical Provider, MD  amiodarone (PACERONE) 200 MG tablet Take 1 tablet (200 mg total) by mouth every evening. Patient taking differently: Take 200 mg by mouth at bedtime.  03/29/16  Yes Larey Dresser, MD  apixaban (ELIQUIS) 5 MG TABS tablet Take 1 tablet (5 mg total) by mouth 2 (two) times daily. 12/25/15  Yes Daniel R Bensimhon,  MD  budesonide-formoterol (SYMBICORT) 160-4.5 MCG/ACT inhaler Inhale 2 puffs into the lungs 2 (two) times daily. 01/10/15  Yes Jolaine Artist, MD  cholecalciferol (VITAMIN D) 1000 UNITS tablet Take 1 tablet (1,000 Units total) by mouth 2 (two) times daily. 01/10/15  Yes Jolaine Artist, MD  citalopram (CELEXA) 40 MG tablet Take 1 tablet (40 mg total) by mouth at bedtime. 01/10/15  Yes Jolaine Artist, MD  docusate sodium (COLACE) 100 MG capsule Take 200 mg by mouth 2 (two) times daily.    Yes Historical Provider, MD  ferumoxytol 510 mg in sodium chloride 0.9 % 100 mL Inject 510 mg into the vein as needed (low iron - checked every 2 weeks).   Yes Historical Provider, MD  furosemide (LASIX) 40 MG tablet Use only if a weight gain of 5 pounds in a week occurs or evident swelling is present Patient taking  differently: Take 40 mg by mouth daily as needed (for weight gain of 5 lb in <3 days or for ankle swelling).  03/29/16  Yes Larey Dresser, MD  hydrocortisone (ANUSOL-HC) 2.5 % rectal cream Place 1 application rectally 2 (two) times daily. Patient taking differently: Place 1 application rectally daily as needed for hemorrhoids or itching.  11/21/15  Yes Larey Dresser, MD  levothyroxine (SYNTHROID, LEVOTHROID) 100 MCG tablet Take 1 tablet (100 mcg total) by mouth at bedtime. 02/23/16  Yes Amy D Clegg, NP  losartan (COZAAR) 25 MG tablet Take 25 mg by mouth at bedtime.   Yes Historical Provider, MD  magnesium hydroxide (MILK OF MAGNESIA) 400 MG/5ML suspension Take 30 mLs by mouth daily as needed (constipation).   Yes Historical Provider, MD  Multiple Vitamin (MULTIVITAMIN WITH MINERALS) TABS tablet Take 1 tablet by mouth at bedtime.    Yes Historical Provider, MD  nitroGLYCERIN (NITROSTAT) 0.4 MG SL tablet Place 0.4 mg under the tongue every 5 (five) minutes as needed for chest pain. Reported on 04/09/2016   Yes Historical Provider, MD  octreotide (SANDOSTATIN LAR) 20 MG injection Inject 20 mg into the muscle every 28 (twenty-eight) days. 08/29/16 01/17/17 Yes Shaune Pascal Bensimhon, MD  pantoprazole (PROTONIX) 40 MG tablet Take 1 tablet (40 mg total) by mouth 2 (two) times daily. 01/10/15  Yes Shaune Pascal Bensimhon, MD  potassium chloride SA (K-DUR,KLOR-CON) 20 MEQ tablet Take 2 tablets (40 mEq total) by mouth daily. And take extra 1 pill with lasix when needed. Patient taking differently: Take 20 mEq by mouth See admin instructions. Take 1 tablet (20 meq) by mouth twice daily/ take 2 extra tablets with each dose of Lasix 04/30/16  Yes Amy D Clegg, NP  PRESCRIPTION MEDICATION Inhale into the lungs at bedtime. CPAP   Yes Historical Provider, MD  simvastatin (ZOCOR) 80 MG tablet Take 0.5 tablets (40 mg total) by mouth at bedtime. 01/10/15  Yes Jolaine Artist, MD  tiotropium (SPIRIVA) 18 MCG inhalation capsule Place  1 capsule (18 mcg total) into inhaler and inhale daily. 01/10/15  Yes Jolaine Artist, MD  vitamin B-12 (CYANOCOBALAMIN) 1000 MCG tablet Take 1,000 mcg by mouth at bedtime.   Yes Historical Provider, MD  vitamin C (ASCORBIC ACID) 500 MG tablet Take 1 tablet (500 mg total) by mouth 2 (two) times daily. 01/10/15  Yes Jolaine Artist, MD   Allergies  Allergen Reactions  . Tape Other (See Comments)    Tape will tear with skin    FAMILY HISTORY:  Family History  Problem Relation Age of Onset  .  Heart attack Mother   . Heart attack Father    SOCIAL HISTORY:  reports that he quit smoking about 15 years ago. His smoking use included Cigarettes. He has a 90.00 pack-year smoking history. He has never used smokeless tobacco. He reports that he does not drink alcohol or use drugs.  REVIEW OF SYSTEMS:  Unable to assess as patient is intubated.  VITAL SIGNS: Temp:  [96.6 F (35.9 C)-99.9 F (37.7 C)] 99.9 F (37.7 C) (04/09 0749) Pulse Rate:  [61-158] 92 (04/09 0740) Resp:  [0-38] 18 (04/09 0740) BP: (41-166)/(28-132) 84/60 (04/09 0800) SpO2:  [89 %-100 %] 100 % (04/09 0740) FiO2 (%):  [70 %-100 %] 70 % (04/09 0317) Weight:  [218 lb 14.7 oz (99.3 kg)] 218 lb 14.7 oz (99.3 kg) (04/09 0740) HEMODYNAMICS: CVP:  [4 mmHg-47 mmHg] 21 mmHg VENTILATOR SETTINGS: Vent Mode: PRVC FiO2 (%):  [70 %-100 %] 70 % Set Rate:  [14 bmp-18 bmp] 18 bmp Vt Set:  [640 mL] 640 mL PEEP:  [5 cmH20] 5 cmH20 Plateau Pressure:  [15 cmH20] 15 cmH20 INTAKE / OUTPUT: Intake/Output      04/08 0701 - 04/09 0700 04/09 0701 - 04/10 0700   I.V. (mL/kg) 3953.6 (40.4) 328.2 (3.3)   Blood 670    IV Piggyback  100   Total Intake(mL/kg) 4623.6 (47.3) 428.2 (4.3)   Urine (mL/kg/hr) 34 (0)    Emesis/NG output     Total Output 34     Net +4589.6 +428.2          PHYSICAL EXAMINATION: Physical Exam  Constitutional: No distress.  Ill-appearing, elderly man.  HENT:  Head: Normocephalic and atraumatic.  Intubated.   Eyes: Conjunctivae are normal. Pupils are equal, round, and reactive to light. Scleral icterus is present.  Cardiovascular:  LVAD in place. Machine sound heard on cardiac auscultation.  Pulmonary/Chest: Effort normal. No respiratory distress.  Abdominal: Soft. He exhibits no distension.  Neurological:  Sedated  Skin: He is not diaphoretic.    LABS: Cbc  Recent Labs Lab 01/05/17 1037  01/05/17 2029 01/05/17 2145 01/06/17 0500  WBC 13.6*  --   --  23.0* 22.9*  HGB 9.4*  < > 7.8* 6.9* 9.6*  HCT 30.8*  < > 23.0* 24.7* 32.6*  PLT 164  --   --  160 160  < > = values in this interval not displayed.  Chemistry   Recent Labs Lab 01/05/17 1036  01/05/17 2029 01/06/17 0003 01/06/17 0427 01/06/17 0500  NA 139  < > 141 131*  --  137  K 5.4*  < > 3.7 2.4*  --  4.2  CL 109  < > 102 92*  --  95*  CO2 21*  --   --  18*  --  23  BUN 34*  < > 47* 35*  --  41*  CREATININE 2.38*  < > 3.80* 3.35*  --  3.97*  CALCIUM 9.0  --   --  8.3*  --  8.7*  MG 2.0  --   --   --  1.5* 1.5*  GLUCOSE 151*  < > 263* 623*  --  418*  < > = values in this interval not displayed.  Liver fxn  Recent Labs Lab 12/29/2016 1913 01/06/17 0500  AST 128* 2,471*  ALT 20 804*  ALKPHOS 50 54  BILITOT 3.0* 7.1*  PROT 6.7 5.4*  ALBUMIN 3.4* 2.8*   coags  Recent Labs Lab 01/05/17 0526 01/05/17 1430 01/05/17 2145  APTT  82* >200* >200*   Sepsis markers  Recent Labs Lab 01/05/17 1037 01/05/17 1308 01/05/17 2145  LATICACIDVEN 3.0* 3.9* 11.4*   ABG  Recent Labs Lab 01/05/17 2029 01/05/17 2103 01/06/17 0451  HCO3  --  16.1* 23.0  TCO2 17 18 24     CBG trend  Recent Labs Lab 01/06/17 0513 01/06/17 0747  GLUCAP 223* 178*    LINES / TUBES: 4/8 R PICC 4/8 Foley    SIGNIFICANT EVENTS:  4/8 Admitted. Received tPA bolus and infusion for thombosis.   DIAGNOSES: Principal Problem:   Acute on chronic systolic congestive heart failure (HCC) Active Problems:   Atrial fibrillation  (HCC)   LVAD (left ventricular assist device) present (HCC)   Acute on chronic systolic CHF (congestive heart failure) (HCC)   Cardiogenic shock (HCC)   ASSESSMENT / PLAN:  PULMONARY  ASSESSMENT: Acute hypoxic respiratory failure: Multifactorial in the setting of LVAD thrombosis, cardiogenic shock.  PLAN:   -Continue ventilatory support -Continue scheduled Dulera and albuterol as needed   CARDIOVASCULAR  ASSESSMENT:  Cardiogenic shock: 2/2 LVAD thrombosis. Received thrombolytics overnight. Co-ox 76.2% this morning. Off milrinone this morning though on epi, norepi, vasopressin.  Atrial fibrillation on chronic anticoagulation VT: s/p defibrillation. Resolved after amiodarone.  PLAN:  -Continue amiodarone per Cardiology.   RENAL  ASSESSMENT:   Acute oliguric renal failure: Crt 4.0, up from 3.2 on admission.  Hypo/hyperkalemia: Resolved Hypomagnesemia  PLAN:   -Continue pressor support as noted above -Continue furosemide 160 mg IV every 6 hours per Cardiology -Check ins and outs -Follow electrolytes and supplement accordingly   GASTROINTESTINAL  ASSESSMENT:   Ischemic hepatitis: AST 2471/ALT 804 this morning with bilirubin 7.1.  PLAN:   -Continue following LFTs   HEMATOLOGIC  ASSESSMENT:   Acute on chronic anemia: Hb 9.6, up from 6.9 on admission. s/p pRBC x 2.  PLAN:  -Follow CBC with goal Hb>8 given cardiac illness   INFECTIOUS  ASSESSMENT:  No active problems.  PLAN:   Continue following.   ENDOCRINE  ASSESSMENT:  No active problems.    PLAN:   -Follow CBG and treat with sliding scale insulin   NEUROLOGIC  ASSESSMENT:  High risk for anoxic brain injury  PLAN:   -Continue reassessing   CLINICAL SUMMARY: [name] is a  76 year old man with chronic systolic CHF s/p LVAD complicated by anemia, OSA on CPAP, emphysema hospitalized for LVAD thrombosis s/p tPA complicated by cardiogenic shock, acute hypoxic respiratory failure on  ventilatory support, oliguric acute renal failure, and now ischemic hepatitis.  Charlott Rakes, PGY3 Internal Medicine Pager: [phone]  01/06/2017, 9:33 AM

## 2017-01-06 NOTE — Consult Note (Signed)
[name] Admit Date: 01/22/2017 01/06/2017 Rexene Agent Requesting Physician:  Bensimhon MD  Reason for Consult:  ANuric AKI HPI:  47M with hx/o ICM and LVAD admitted with cardiogenic shock 2/2 LVAD thrombosis.  Req inotropes and pressors and 4/8 give tPA systemic.  Also with VT req amio boluses during hospitalzation, VDRF, ABLA req transfusion during admission.    PMH Incudes:  sCHF 2/2 ICM, s/p LVAD x4y  ASCVD CAD s/p CABG 2003  COPD  OSA on CPAP  pAFib  During admission has developed anuric AKI with near normal baseline GFR in setting of shock req pressors and LVAD thrombosis s/p tPA.  No renal imaging at current time.  Rec high dose lasix with response.  ON NaHCO3 gtt for acidosis, last HCO3 30 and VBG pH 7.31   Creatinine (mg/dL)  Date Value  11/21/2016 1.2  09/26/2016 1.0  08/01/2016 0.8  06/04/2016 0.8  11/01/2014 0.9   Creatinine, Ser (mg/dL)  Date Value  01/06/2017 5.08 (H)  01/06/2017 4.68 (H)  01/06/2017 3.93 (H)  01/06/2017 3.97 (H)  01/06/2017 3.35 (H)  01/05/2017 3.80 (H)  01/05/2017 2.80 (H)  01/05/2017 2.38 (H)  01/05/2017 1.71 (H)  01/09/2017 1.40 (H)  ] I/Os: I/O last 3 completed shifts: In: 6790.2 [I.V.:5954.2; Blood:670; IV Piggyback:166] Out: 63 [Urine:34]   ROS Balance of 12 systems is negative w/ exceptions as above  PMH  Past Medical History:  Diagnosis Date  . Anemia   . Asbestosis(501)    "6 years in the [place]" (05/24/2013)  . Atrial fibrillation ([place])    permanent  . Cancer ([place])    "scraped some off behind my left ear; fast moving; having it cut out 12/05/2015" (11/07/2015)  . CHF (congestive heart failure) ([place])   . Chronic anticoagulation    Afib and LVAD  . Chronic systolic heart failure ([place])   . 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 Complication of anesthesia    "they can't put me all the way to sleep cause of my heart" (11/07/2015)  . COPD (chronic obstructive pulmonary  disease) ([place])   . Coronary artery disease   . Depression   . Epistaxis 11/2015, 07/2014  . History of blood transfusion 08/2015 X 10; 11/2015 X 2   "related to bleeding on the inside somewhere" (11/07/2015)  . Hyperlipidemia   . Hypertension   . Hypothyroidism   . Ischemic cardiomyopathy     CABG 2003, PCI 2007  EF 27%(myoview 2012  . LVAD (left ventricular assist device) present ([place]) 12/2012  . Myocardial infarction 1990's-2000   "2 in  ~ the 1990's; 1 in ~ 2000" (11/07/2015)  . Obesity   . On home oxygen therapy    "have it available; don't use it" (11/07/2015)  . OSA on CPAP    no longer on cpap  . Paroxysmal ventricular tachycardia ([place])   . Restless leg syndrome   . Shortness of breath dyspnea    PSH  Past Surgical History:  Procedure Laterality Date  . APPENDECTOMY    . CARDIAC DEFIBRILLATOR PLACEMENT  2004; ~ 2010   "cut it out in 2016 after it got infected"   . COLONOSCOPY N/A 03/09/2014   Procedure: COLONOSCOPY;  Surgeon: Inda Castle, MD;  Location: [facility];  Service: Endoscopy;  Laterality: N/A;  LVAD  patient  . COLONOSCOPY N/A 07/29/2014   Procedure: COLONOSCOPY;  Surgeon: Gatha Mayer, MD;  Location: [facility]  ENDOSCOPY;  Service: Endoscopy;  Laterality: N/A;  . CORONARY ARTERY BYPASS GRAFT  2003   "?X4" (05/24/2013)  . ENTEROSCOPY N/A 07/27/2014   Procedure: ENTEROSCOPY;  Surgeon: Gatha Mayer, MD;  Location: [facility] ENDOSCOPY;  Service: Endoscopy;  Laterality: N/A;  LVAD patient   . ENTEROSCOPY N/A 09/27/2014   Procedure: ENTEROSCOPY;  Surgeon: Gatha Mayer, MD;  Location: [facility] ENDOSCOPY;  Service: Endoscopy;  Laterality: N/A;  . ENTEROSCOPY N/A 11/09/2015   Procedure: ENTEROSCOPY;  Surgeon: Mauri Pole, MD;  Location: [place] ENDOSCOPY;  Service: Endoscopy;  Laterality: N/A;  LVAD  . ENTEROSCOPY N/A 12/13/2015   Procedure: ENTEROSCOPY;  Surgeon: Milus Banister, MD;  Location: [place];  Service: Endoscopy;  Laterality: N/A;  . ENTEROSCOPY N/A 02/21/2016    Procedure: ENTEROSCOPY;  Surgeon: Milus Banister, MD;  Location: [place];  Service: Endoscopy;  Laterality: N/A;  . ERCP N/A 06/13/2016   Procedure: ENDOSCOPIC RETROGRADE CHOLANGIOPANCREATOGRAPHY (ERCP);  Surgeon: Milus Banister, MD;  Location: [place];  Service: Endoscopy;  Laterality: N/A;  . ESOPHAGOGASTRODUODENOSCOPY N/A 03/09/2014   Procedure: ESOPHAGOGASTRODUODENOSCOPY (EGD);  Surgeon: Inda Castle, MD;  Location: [place];  Service: Endoscopy;  Laterality: N/A;  . ESOPHAGOGASTRODUODENOSCOPY N/A 08/01/2014   Procedure: ESOPHAGOGASTRODUODENOSCOPY (EGD);  Surgeon: Jerene Bears, MD;  Location: [facility] ENDOSCOPY;  Service: Endoscopy;  Laterality: N/A;  LVAD patient  . GIVENS CAPSULE STUDY N/A 03/10/2014   Procedure: GIVENS CAPSULE STUDY;  Surgeon: Inda Castle, MD;  Location: [place];  Service: Endoscopy;  Laterality: N/A;  . GIVENS CAPSULE STUDY N/A 07/30/2014   Procedure: GIVENS CAPSULE STUDY;  Surgeon: Gatha Mayer, MD;  Location: [facility];  Service: Endoscopy;  Laterality: N/A;  . GIVENS CAPSULE STUDY N/A 12/11/2015   Procedure: GIVENS CAPSULE STUDY;  Surgeon: Gatha Mayer, MD;  Location: [facility];  Service: Endoscopy;  Laterality: N/A;  . ICD LEAD REMOVAL Left 03/16/2015   Procedure: ICD LEAD REMOVAL/EXTRACTION;  Surgeon: Evans Lance, MD;  Location: [place];  Service: Cardiovascular;  Laterality: Left;  PATIENT HAS LVAD  DR. VAN TRIGT TO BACK UP EXTRACTION  . INSERTION OF IMPLANTABLE LEFT VENTRICULAR ASSIST DEVICE N/A 01/12/2013   Procedure: INSERTION OF IMPLANTABLE LEFT VENTRICULAR ASSIST DEVICE;  Surgeon: Ivin Poot, MD;  Location: [facility];  Service: Open Heart Surgery;  Laterality: N/A;   nitric oxide; Redo sternotomy  . INTRAOPERATIVE TRANSESOPHAGEAL ECHOCARDIOGRAM N/A 01/12/2013   Procedure: INTRAOPERATIVE TRANSESOPHAGEAL ECHOCARDIOGRAM;  Surgeon: Ivin Poot, MD;  Location: [facility];  Service: Open Heart Surgery;  Laterality: N/A;  . IR GENERIC  HISTORICAL  07/23/2016   IR SINUS/FIST TUBE CHK-NON GI 07/23/2016 Arne Cleveland, MD [facility]-INTERV RAD  . IR GENERIC HISTORICAL  05/14/2016   IR RADIOLOGIST EVAL & MGMT 05/14/2016 [facility] INTERV RAD  . LEFT AND RIGHT HEART CATHETERIZATION WITH CORONARY/GRAFT ANGIOGRAM  01/07/2013   Procedure: LEFT AND RIGHT HEART CATHETERIZATION WITH Beatrix Fetters;  Surgeon: Jolaine Artist, MD;  Location: [facility] CATH LAB;  Service: Cardiovascular;;   FH  Family History  Problem Relation Age of Onset  . Heart attack Mother   . Heart attack Father    [place]  reports that he quit smoking about 15 years ago. His smoking use included Cigarettes. He has a 90.00 pack-year smoking history. He has never used smokeless tobacco. He reports that he does not drink alcohol or use drugs. Allergies  Allergies  Allergen Reactions  . Tape Other (See Comments)    Tape will tear with skin  Home medications Prior to Admission medications   Medication Sig Start Date End Date Taking? Authorizing Provider  albuterol (PROVENTIL) (2.5 MG/3ML) 0.083% nebulizer solution Take 2.5 mg by nebulization every 6 (six) hours as needed for wheezing or shortness of breath.   Yes Historical Provider, MD  amiodarone (PACERONE) 200 MG tablet Take 1 tablet (200 mg total) by mouth every evening. Patient taking differently: Take 200 mg by mouth at bedtime.  03/29/16  Yes Larey Dresser, MD  apixaban (ELIQUIS) 5 MG TABS tablet Take 1 tablet (5 mg total) by mouth 2 (two) times daily. 12/25/15  Yes Jolaine Artist, MD  budesonide-formoterol [facility]) 160-4.5 MCG/ACT inhaler Inhale 2 puffs into the lungs 2 (two) times daily. 01/10/15  Yes Jolaine Artist, MD  cholecalciferol (VITAMIN D) 1000 UNITS tablet Take 1 tablet (1,000 Units total) by mouth 2 (two) times daily. 01/10/15  Yes Jolaine Artist, MD  citalopram (CELEXA) 40 MG tablet Take 1 tablet (40 mg total) by mouth at bedtime. 01/10/15  Yes Jolaine Artist, MD  docusate sodium (COLACE)  100 MG capsule Take 200 mg by mouth 2 (two) times daily.    Yes Historical Provider, MD  ferumoxytol 510 mg in sodium chloride 0.9 % 100 mL Inject 510 mg into the vein as needed (low iron - checked every 2 weeks).   Yes Historical Provider, MD  furosemide (LASIX) 40 MG tablet Use only if a weight gain of 5 pounds in a week occurs or evident swelling is present Patient taking differently: Take 40 mg by mouth daily as needed (for weight gain of 5 lb in <3 days or for ankle swelling).  03/29/16  Yes Larey Dresser, MD  hydrocortisone (ANUSOL-HC) 2.5 % rectal cream Place 1 application rectally 2 (two) times daily. Patient taking differently: Place 1 application rectally daily as needed for hemorrhoids or itching.  11/21/15  Yes Larey Dresser, MD  levothyroxine (SYNTHROID, LEVOTHROID) 100 MCG tablet Take 1 tablet (100 mcg total) by mouth at bedtime. 02/23/16  Yes Amy D Clegg, NP  losartan (COZAAR) 25 MG tablet Take 25 mg by mouth at bedtime.   Yes Historical Provider, MD  magnesium hydroxide (MILK OF MAGNESIA) 400 MG/5ML suspension Take 30 mLs by mouth daily as needed (constipation).   Yes Historical Provider, MD  Multiple Vitamin (MULTIVITAMIN WITH MINERALS) TABS tablet Take 1 tablet by mouth at bedtime.    Yes Historical Provider, MD  nitroGLYCERIN (NITROSTAT) 0.4 MG SL tablet Place 0.4 mg under the tongue every 5 (five) minutes as needed for chest pain. Reported on 04/09/2016   Yes Historical Provider, MD  octreotide (SANDOSTATIN LAR) 20 MG injection Inject 20 mg into the muscle every 28 (twenty-eight) days. 08/29/16 01/17/17 Yes Shaune Pascal Bensimhon, MD  pantoprazole (PROTONIX) 40 MG tablet Take 1 tablet (40 mg total) by mouth 2 (two) times daily. 01/10/15  Yes Shaune Pascal Bensimhon, MD  potassium chloride SA (K-DUR,KLOR-CON) 20 MEQ tablet Take 2 tablets (40 mEq total) by mouth daily. And take extra 1 pill with lasix when needed. Patient taking differently: Take 20 mEq by mouth See admin instructions. Take 1  tablet (20 meq) by mouth twice daily/ take 2 extra tablets with each dose of Lasix 04/30/16  Yes Amy D Clegg, NP  PRESCRIPTION MEDICATION Inhale into the lungs at bedtime. CPAP   Yes Historical Provider, MD  simvastatin (ZOCOR) 80 MG tablet Take 0.5 tablets (40 mg total) by mouth at bedtime. 01/10/15  Yes Jolaine Artist, MD  tiotropium (SPIRIVA) 18 MCG inhalation capsule Place 1 capsule (18 mcg total) into inhaler and inhale daily. 01/10/15  Yes Jolaine Artist, MD  vitamin B-12 (CYANOCOBALAMIN) 1000 MCG tablet Take 1,000 mcg by mouth at bedtime.   Yes Historical Provider, MD  vitamin C (ASCORBIC ACID) 500 MG tablet Take 1 tablet (500 mg total) by mouth 2 (two) times daily. 01/10/15  Yes Jolaine Artist, MD    Current Medications Scheduled Meds: . arformoterol  15 mcg Nebulization BID  . budesonide (PULMICORT) nebulizer solution  0.25 mg Nebulization BID  . chlorhexidine gluconate (MEDLINE KIT)  15 mL Mouth Rinse BID  . Chlorhexidine Gluconate Cloth  6 each Topical Daily  . furosemide  160 mg Intravenous Q6H  . insulin aspart  1-3 Units Subcutaneous Q4H  . levothyroxine  50 mcg Intravenous QHS  . mouth rinse  15 mL Mouth Rinse 10 times per day  . pantoprazole (PROTONIX) IV  40 mg Intravenous Q12H  . sodium chloride flush  10-40 mL Intracatheter Q12H  . sodium chloride flush  3 mL Intravenous Q12H   Continuous Infusions: . amiodarone 60 mg/hr (01/06/17 2013)  . epinephrine 2.5 mcg/min (01/06/17 2000)  . fentaNYL infusion INTRAVENOUS 25 mcg/hr (01/06/17 2000)  . heparin 1,000 Units/hr (01/06/17 2015)  . midazolam (VERSED) infusion 0.5 mg/hr (01/06/17 2000)  . norepinephrine (LEVOPHED) Adult infusion 40 mcg/min (01/06/17 2000)  .  sodium bicarbonate  infusion 1000 mL 75 mL/hr at 01/06/17 1800  . vasopressin (PITRESSIN) infusion - *FOR SHOCK* 0.03 Units/min (01/06/17 2013)   PRN Meds:.sodium chloride, acetaminophen (TYLENOL) oral liquid 160 mg/5 mL **OR** acetaminophen, albuterol,  fentaNYL, midazolam, ondansetron (ZOFRAN) IV, sodium chloride flush, sodium chloride flush  CBC  Recent Labs Lab 01/19/2017 1913  01/05/17 2145 01/06/17 0500 01/06/17 0913  WBC 10.5  < > 23.0* 22.9* 22.4*  NEUTROABS 9.1*  --   --   --   --   HGB 9.8*  < > 6.9* 9.6* 10.3*  HCT 34.1*  < > 24.7* 32.6* 33.6*  MCV 100.3*  < > 107.9* 98.8 97.4  PLT 154  < > 160 160 155  < > = values in this interval not displayed. Basic Metabolic Panel  Recent Labs Lab 01/05/17 0137 01/05/17 1036 01/05/17 1853 01/05/17 2029 01/06/17 0003 01/06/17 0500 01/06/17 0946 01/06/17 1300 01/06/17 1815  NA 136 139 130* 141 131* 137 134* 141 140  K 5.4* 5.4* 3.8 3.7 2.4* 4.2 3.5 4.3 4.4  CL 106 109 93* 102 92* 95* 98* 98* 97*  CO2 19* 21*  --   --  18* _0 GLUCOSE 174* 151* 603* 263* 623* 418* 365* 174* 192*  BUN 29* 34* 40* 47* 35* 41* 41* 49* 51*  CREATININE 1.71* 2.38* 2.80* 3.80* 3.35* 3.97* 3.93* 4.68* 5.08*  CALCIUM 9.1 9.0  --   --  8.3* 8.7* 7.8* 8.8* 8.8*    Physical Exam  Blood pressure (!) 72/59, pulse (!) 127, temperature 98.7 F (37.1 C), temperature source Oral, resp. rate (!) 21, height _1  (1.854 m), weight 99.3 kg (218 lb 14.7 oz), SpO2 95 %. GEN: intubated, awake ENT: NCAT, ETT in place EYES: eyes open CV: continuous hum present PULM: coarse bs b/l ABD: s/nd SKIN: no rashes/lesions EXT:No LEE   Assessment 8M with anuric AKI in setting of cardiogenic shock on 3 pressors and LVAD thrombosis  1. Anuric AKI, suspected ATN from shock, also possible is heme pigment toxicity 2. Cardiogenic Shock from LVAD  malfunction 3. Chronic CHF with LVAD 4. ASCVD 5. COPD with VDRF 6. Anemia, acute on chroni with ABLA 7. Acidosis, mixed resp and metabolic  Plan 1. Long discussion with wife.  She does not desire prolonged life sustaining therapy and sets dealine of 4/11.  After discussing nature and reasonable expectations of CRRT (I believe if employed would probably not change  overall outcome and would require days before potentially renal recovery) she is adamant that he would not dialysis. I agreed with her this is a reasonable choice.   2. Will follow along and discuss again with team / family in AM 3. Follow HCO3 and VBG, pt might not need continued NaHCO3 gtt   Pearson Grippe MD [phone] pgr 01/06/2017, 8:30 PM

## 2017-01-06 NOTE — Telephone Encounter (Signed)
Patient Wife called to cancel lab appt due to husband being in the hospital - message MD

## 2017-01-06 NOTE — Progress Notes (Signed)
TPA  Infusion stopped at 8hr mark per Dr Haroldine Laws

## 2017-01-06 NOTE — Progress Notes (Addendum)
Results for CHADEN, DOOM (MRN [id number]) as of 01/06/2017 10:46  Ref. Range 01/06/2017 05:13 01/06/2017 07:47  Glucose-Capillary Latest Ref Range: 65 - 99 mg/dL 223 (H) 178 (H)  Noted that blood sugars have not been consistent.  Spoke with staff RN recommending to check finger sticks for blood sugars.  Recommend changing Novolog SENSITIVE correction scale to every 4 hours since patient is NPO. Will continue to monitor blood sugars while in the hospital.   Harvel Ricks RN BSN CDE Diabetes Coordinator Pager: [phone]  8am-5pm

## 2017-01-06 NOTE — Progress Notes (Signed)
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   On 4/8 developed worsening shock refractory to inotropes. Received IV TPA with normalization of pump flow. T-PA stopped at midnight. Had one bloody BM but otherwise no apparent bleeding. Given 2u RBC overnight  Remains intubated but awake on vent and follows commands. Anuric. Had VT 2 times last night and was defibrillated x 1. Finally broke with amio bolus  Remains on epi 1 (down from 20), norepi 40 and vasopressin. Also on bicarb drip    MAP 74 co-ox 76%. Lactate last night 11.   Labs just coming back this am  Ph 7.31. Mag 1.5  K 2.4 (supped)  Creatinine 3.35 LDH 3,776   LVAD INTERROGATION:  HeartMate II LVAD:  Flow 5.1  liters/min, speed 9000, power 5.3  PI 4.6 .  Objective:    Vital Signs:   Temp:  [96.6 F (35.9 C)-99.4 F (37.4 C)] 99.2 F (37.3 C) (04/09 0515) Pulse Rate:  [61-158] 93 (04/09 0550) Resp:  [0-38] 18 (04/09 0550) BP: (41-166)/(28-132) 76/57 (04/09 0545) SpO2:  [89 %-100 %] 100 % (04/09 0550) FiO2 (%):  [70 %-100 %] 70 % (04/09 0317) Last BM Date: 01/05/17 Mean arterial Pressure 70s   Intake/Output:   Intake/Output Summary (Last 24 hours) at 01/06/17 0600 Last data filed at 01/06/17 0500  Gross per 24 hour  Intake          4264.45 ml  Output               29 ml  Net          4235.45 ml     Physical Exam: General:  On vent. Awake and follows commands  HEENT: normal x for ETT  + scleral icterus  Neck: supple. JVP to jaw . Carotids 2+ bilat; no bruits. No lymphadenopathy or thryomegaly appreciated. Cor: Mechanical heart sounds with LVAD hum present. LVAD sounds okHeart sounds more prominent but no pump groan Lungs: mild crackles  Abdomen: soft, nontender, + distended.  No bruits or masses. Hypoactive bowel sounds. Driveline: C/D/I; securement device intact and driveline incorporated. Site clear  Extremities: no cyanosis, clubbing, rash, . Mild  edema Neuro: awake on vent alert & orientedx3, cranial nerves grossly intact. moves all 4 extremities w/o difficulty.   Telemetry: AF 90-100 Personally reviewed    Labs: Basic Metabolic Panel:  Recent Labs Lab 01/27/2017 1913  01/05/17 0137 01/05/17 1036 01/05/17 1853 01/05/17 2029 01/06/17 0003 01/06/17 0427  NA 139  < > 136 139 130* 141 131*  --   K 4.9  < > 5.4* 5.4* 3.8 3.7 2.4*  --   CL 107  < > 106 109 93* 102 92*  --   CO2 19*  --  19* 21*  --   --  18*  --   GLUCOSE 143*  < > 174* 151* 603* 263* 623*  --   BUN 26*  < > 29* 34* 40* 47* 35*  --   CREATININE 1.42*  < > 1.71* 2.38* 2.80* 3.80* 3.35*  --   CALCIUM 9.4  --  9.1 9.0  --   --  8.3*  --   MG  --   --   --  2.0  --   --   --  1.5*  < > = values in this interval not displayed.  Liver Function Tests:  Recent Labs Lab 01/10/2017 1913  AST 128*  ALT 20  ALKPHOS 50  BILITOT 3.0*  PROT 6.7  ALBUMIN 3.4*   No results for input(s): LIPASE, AMYLASE in the last 168 hours. No results for input(s): AMMONIA in the last 168 hours.  CBC:  Recent Labs Lab 01/26/2017 1913  01/05/17 0137 01/05/17 1037 01/05/17 1853 01/05/17 2029 01/05/17 2145  WBC 10.5  --  12.3* 13.6*  --   --  23.0*  NEUTROABS 9.1*  --   --   --   --   --   --   HGB 9.8*  < > 9.8* 9.4* 8.2* 7.8* 6.9*  HCT 34.1*  < > 33.5* 30.8* 24.0* 23.0* 24.7*  MCV 100.3*  --  99.4 100.0  --   --  107.9*  PLT 154  --  169 164  --   --  160  < > = values in this interval not displayed.  INR: No results for input(s): INR in the last 168 hours.  Other results:    Imaging: Ct Chest Wo Contrast  Result Date: 01/05/2017 CLINICAL DATA:  Acute on chronic systolic CHF. LVAD thrombosis and pump malfunction. Evaluate outflow graft for mechanical obstruction/kinking. No contrast with acute renal failure. EXAM: CT CHEST WITHOUT CONTRAST TECHNIQUE: Multidetector CT imaging of the chest was performed following the standard protocol without IV contrast. COMPARISON:   Chest CT dated 01/21/2013. Chest x-ray from earlier same day. FINDINGS: Cardiovascular: Left ventricular assist device appears stable in position, inflow cannula at the apex of the left ventricle, mildly angulated, stable in position and configuration. Left ventricular anastomosis and aortic anastomosis appears stable compared to the previous exam. No acute findings relative to the LVAD, although characterization is limited without IV contrast. Cardiomegaly appears stable. No pericardial effusion. No aortic aneurysm or dissection. Aortic atherosclerosis.  Coronary artery calcifications. Mediastinum/Nodes: Scattered small lymph nodes within the mediastinum. No enlarged or morphologically abnormal lymph nodes identified within the mediastinum or perihilar regions. Lungs/Pleura: Patchy consolidations at each lung base, most likely atelectasis. Mild interstitial edema bilaterally, perihilar and upper lobe predominant. Mild emphysematous changes again noted, upper lobe predominant. Bilateral calcified pleural plaques again noted, presumably related to previous asbestos exposure. Upper Abdomen: Cirrhotic appearing liver. Cholelithiasis without evidence of acute cholecystitis, incompletely imaged. Small amount of free fluid/ascites within the upper abdomen. Musculoskeletal: No acute or suspicious osseous finding. Median sternotomy wires in place. IMPRESSION: 1. Compared to CT of 01/21/2013, LVAD appears stable in position and configuration. Surrounding soft tissues appear stable. 2. Cardiomegaly with perihilar and upper lobe edema indicating mild CHF/volume overload. 3. Patchy small consolidations at each lung base, likely atelectasis. 4. Small right pleural effusion and trace left pleural effusion. 5. Emphysematous changes, upper lobe predominant. 6. Aortic atherosclerosis. 7. Cirrhotic liver. 8. Cholelithiasis. 9. Small amount of free fluid/ascites in the upper abdomen. Electronically Signed   By: Bary Richard M.D.    On: 01/05/2017 13:26   Dg Chest Port 1 View  Result Date: 01/05/2017 CLINICAL DATA:  Endotracheal tube placement.  Initial encounter. EXAM: PORTABLE CHEST 1 VIEW COMPARISON:  Chest radiograph and CT of the chest performed earlier today at 12:56 p.m. FINDINGS: The patient's endotracheal tube is seen ending 2-3 cm above the carina. A right PICC is noted ending about the distal SVC. The lungs are well-aerated. Vascular congestion is noted. Increased interstitial markings raise concern for pulmonary edema, increased from the prior studies. Small bilateral pleural effusions are suspected. There is no evidence of pneumothorax. The cardiomediastinal silhouette is within normal limits. No acute  osseous abnormalities are seen. An external pacing pad is noted. IMPRESSION: 1. Endotracheal tube seen ending 2-3 cm above the carina. 2. Vascular congestion. Increased interstitial markings raise concern for pulmonary edema, more prominent than on the prior studies. Small bilateral pleural effusions suspected. Electronically Signed   By: Garald Balding M.D.   On: 01/05/2017 21:50   Dg Chest Port 1 View  Result Date: 01/05/2017 CLINICAL DATA:  Confirm line placement. History of atrial fibrillation, CHF, heart failure, COPD, hypertension, myocardial infarction, defibrillator. EXAM: PORTABLE CHEST 1 VIEW COMPARISON:  Chest x-ray dated 01/15/2017. FINDINGS: Right-sided PICC line has been placed with tip appropriately positioned at the level of the lower SVC. Cardiomegaly appears stable. Left ventricular assist device in place. Stable central pulmonary vascular congestion and bilateral interstitial edema. Lungs are at least mildly hyperexpanded suggesting COPD. No pleural effusion or pneumothorax seen. IMPRESSION: 1. Right-sided PICC line in place with tip appropriately positioned at the level of the lower SVC. 2. Cardiomegaly with central pulmonary vascular congestion and bilateral interstitial edema suggesting CHF/volume  overload, similar to yesterday's exam. Electronically Signed   By: Franki Cabot M.D.   On: 01/05/2017 10:58   Dg Chest Port 1 View  Result Date: 01/01/2017 CLINICAL DATA:  Shortness of breath for 1 week. Ischemic cardiomyopathy. COPD. Left ventricular assist device. EXAM: PORTABLE CHEST 1 VIEW COMPARISON:  02/26/2016 FINDINGS: Cardiomegaly stable. Left ventricular assist device is again seen, as well as prior CABG. Pulmonary hyperinflation again noted. Increased pulmonary interstitial prominence is suspicious for mild interstitial edema. No evidence of pulmonary consolidation or pleural effusion. IMPRESSION: Increased pulmonary interstitial prominence, suspicious for mild interstitial edema. Stable cardiomegaly with left ventricular assist device. Electronically Signed   By: Earle Gell M.D.   On: 12/31/2016 19:02     Medications:     Scheduled Medications: . sodium chloride   Intravenous Once  . atorvastatin  40 mg Oral q1800  . chlorhexidine gluconate (MEDLINE KIT)  15 mL Mouth Rinse BID  . Chlorhexidine Gluconate Cloth  6 each Topical Daily  . docusate sodium  200 mg Oral BID  . levothyroxine  50 mcg Intravenous QHS  . LORazepam  1 mg Intravenous Once  . mouth rinse  15 mL Mouth Rinse QID  . mometasone-formoterol  2 puff Inhalation BID  . pantoprazole (PROTONIX) IV  40 mg Intravenous Q12H  . sodium bicarbonate      . sodium bicarbonate      . sodium chloride flush  10-40 mL Intracatheter Q12H  . sodium chloride flush  3 mL Intravenous Q12H    Infusions: . amiodarone 60 mg/hr (01/06/17 0515)  . amiodarone    . epinephrine 1 mcg/min (01/06/17 0521)  . fentaNYL infusion INTRAVENOUS 50 mcg/hr (01/05/17 2337)  . heparin 1,200 Units/hr (01/06/17 0032)  . midazolam (VERSED) infusion 1 mg/hr (01/06/17 0216)  . milrinone Stopped (01/05/17 2126)  . norepinephrine (LEVOPHED) Adult infusion 40 mcg/min (01/06/17 0534)  .  sodium bicarbonate  infusion 1000 mL 75 mL/hr at 01/05/17 2208  .  vasopressin (PITRESSIN) infusion - *FOR SHOCK* 0.03 Units/min (01/05/17 2208)    PRN Medications: sodium chloride, acetaminophen, albuterol, ondansetron (ZOFRAN) IV, sodium chloride flush, sodium chloride flush   Assessment:   1. 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 Co-ox 76%  Wean epi to off. Slow wean of norep   - pH improved. Continue bicarb drip  for now 2. LVAD pump thrombosis   -s/p t-pa infusion 4/8. Now on heparin  3. Acute on chronic systolic HF     --HMII implanted 01/12/13     --Echo 4/8 LVEF 30-35% RV moderately decreased     --Markedly volume overload with diffuse pulmonary edema on CXR. Now anuric due to AKI. Wll give lasix 160q 6 and assess response.      -- Will likely need CVVHD. No sticks until 24 hours after t-PA (tonight at midnight) 4. Chronic AF with RVR     --rate improved on amio. Wil continue 5. AKI     --hemodynamically mediated due to shock/ATN     --try lasix 160 q6. Will likely need CVVHD tomorrow (have to wait to place dialysis cath post t-pa) 6. Shock liver      --LFTs pending. Continue supportive care.  7. Hypokalemia/Hypomag      --continue to supp 8. VT     -s/p defib x 1 4/9. Supp K and Mg. Continue amio 9. Acute Gi bleed in setting of t-PA     -s/p 2u RBCs on 4/9. Hgb stable on co-ox this am. Follow.  10. CAD s/p CABG 2003     -stable  He remains EXTREMELY tenuous but LVAD pump flows now improved. The issue will be if he can survive all the end-organ damage that occurred. Will follow electrolytes closely and replace. Keep intubated. Maintain MAPs.   Will consult Renal for probable CVVHD soon. Wife update   Critical Care Time devoted to patient care services described in this note is 23 Minutes. This time reflects time of care of this signee, Dr. Pierre Bali who personally examined the patient and determined the plan of  care. This critical care time does not reflect procedure time, teaching time or supervisory time of our PA/NP/resident but could involve care discussion and coordination time.   I reviewed the LVAD parameters from today, and compared the results to the patient's prior recorded data.  No programming changes were made.  The LVAD is functioning within specified parameters.  The patient performs LVAD self-test daily.  LVAD interrogation was negative for any significant power changes, alarms or PI events/speed drops.  LVAD equipment check completed and is in good working order.  Back-up equipment present.   LVAD education done on emergency procedures and precautions and reviewed exit site care.  Length of Stay: 2  Glori Bickers MD 01/06/2017, 6:00 AM  VAD Team --- VAD ISSUES ONLY--- Pager [phone] (7am - 7am)  Advanced Heart Failure Team  Pager [phone] (M-F; 7a - 4p)  Please contact [facility] for night-coverage after hours (4p -7a ) and weekends on [email or web address]

## 2017-01-06 NOTE — Progress Notes (Signed)
Admitted 01/23/2017 due to Cardiogenic shock and LVAD thrombus.   HeartMate II LVAD implated on 01/12/2013 by Dr Prescott Gum.   Intubated 01/05/17  TPA administered x 10 hrs 01/05/17 Currently on Epi, Norepi, Vasopressin, Amio, Milrinone, Heparin gtts  V-Tach 01/05/17- defib x1   Vital signs: HR: 95 Doppler Pressure:75 Automatic BP: 83/69 O2 Sat: 97 Wt:99.3 kg     LVAD interrogation reveals:  Speed:9000 Flow: 5 Power:  5 PI:4.6  Alarms: Multiple LOW FLOW alarms 01/05/17. None overnight Events: no PI events overnight Fixed speed: 9000 Low speed limit: 8400  Drive Line: Unremarkable. Changed by wife, [name], weekly  Labs:  LDH trend:1370>1731>2508>[id number]>4954  INR trend:  On Eliquis prior to admission  Anticoagulation Plan: - Heparin drip -ASA Dose: none  Adverse Events on VAD: -GIB -Anemia  Plan/Recommendations:   1. Continue supportive care. Personally spoke with [name] (wife) at length who remains optimistic. States she wants to continue aggressive care and re-evaluate Wednesday morning and if no improvement, would like to consider comfort measures.

## 2017-01-06 NOTE — Progress Notes (Signed)
ANTICOAGULATION CONSULT NOTE - Follow Up Consult  Pharmacy Consult for heparin Indication: LVAD clot  Labs:  Recent Labs  01/05/17 0137 01/05/17 0526 01/05/17 1036 01/05/17 1037 01/05/17 1430 01/05/17 1853 01/05/17 2029 01/05/17 2145  HGB 9.8*  --   --  9.4*  --  8.2* 7.8* 6.9*  HCT 33.5*  --   --  30.8*  --  24.0* 23.0* 24.7*  PLT 169  --   --  164  --   --   --  160  APTT 84* 82*  --   --  >200*  --   --  >200*  HEPARINUNFRC  --   --   --   --   --   --   --  1.42*  CREATININE 1.71*  --  2.38*  --   --  2.80* 3.80*  --     Assessment:  76yo male with LVAD admitted with probable pump thrombosis. APTT was 80 > therapeutic on bivalirudin with initial dosing while Eliquis on hold. Bivalirudin drip was moved from peripheral IV site to PICC line and aptt drawn from PICC line as well at 1430 so inaccurate (> 200). Bivalirudin gtt changed to heparin drip since TPA started at 3mg /hr to break up clot. TPA gtt just ending - per MD note appears clot has lysed.  Hgb down to 6.9 (s/p PRBC x 2 units tonight). PTT remains > 200 and heparin level 1.42). Hard to interpret these levels in order to adjust heparin as PTT likely being affected by TPA and heparin level likely still being affected by Eliquis in pt with acute on chronic renal failure. Also pt labs drawn from R brachial PICC line, heparin running in R wrist, TPA running in R AC.  Noted plan to treat aggressively for 48-72 hours then consider comfort care.  Goal: Heparin level 0.3-0.5 units/hr (PTT 66-90) while TPA infusing Monitor platelets per [facility] protocol  Plan: Decrease heparin drip to 1200 uts/hr Will f/u 8 hr heparin level and PTT  Sherlon Handing, PharmD, BCPS Clinical pharmacist, pager [phone] 01/06/2017 12:14 AM

## 2017-01-06 NOTE — Progress Notes (Signed)
ANTICOAGULATION CONSULT NOTE - Follow Up Consult  Pharmacy Consult for heparin Indication: LVAD clot  Labs:  Recent Labs  01/05/17 2145  01/06/17 0500 01/06/17 0830 01/06/17 0913 01/06/17 0946 01/06/17 1300 01/06/17 1815  HGB 6.9*  --  9.6*  --  10.3*  --   --   --   HCT 24.7*  --  32.6*  --  33.6*  --   --   --   PLT 160  --  160  --  155  --   --   --   APTT >200*  --   --  161*  --   --   --  119*  HEPARINUNFRC 1.42*  --   --  1.08*  --   --   --   --   CREATININE  --   < > 3.97*  --   --  3.93* 4.68* 5.08*  < > = values in this interval not displayed.  Medications: Heparin @ 1050 units/hr  Assessment:  76yo male with LVAD admitted with probable pump thrombosis. APTT was 80 > therapeutic on bivalirudin with initial dosing while Eliquis on hold. Bivalirudin drip was moved from peripheral IV site to PICC line and aptt drawn from PICC line as well at 1430 so inaccurate (> 200). Bivalirudin gtt changed to heparin drip since TPA started at 3mg /hr to break up clot. TPA ran for approximately 10 hours  Hgb up to 10 this morning after PRBC x 2 units 4/8.   APTT is decreased but remains slightly above goal at 119 seconds.  Goal: Heparin level 0.3-0.7 units/hr (PTT 66-102)  Monitor platelets per [facility] protocol  Plan: 1) Decrease heparin to 1000 units/hr 2) Follow up daily heparin level and aPTT  [name], PharmD, BCPS 01/06/2017 8:09 PM

## 2017-01-07 ENCOUNTER — Inpatient Hospital Stay (HOSPITAL_COMMUNITY): Payer: Non-veteran care

## 2017-01-07 DIAGNOSIS — I482 Chronic atrial fibrillation: Secondary | ICD-10-CM

## 2017-01-07 DIAGNOSIS — R570 Cardiogenic shock: Secondary | ICD-10-CM

## 2017-01-07 DIAGNOSIS — J9601 Acute respiratory failure with hypoxia: Secondary | ICD-10-CM

## 2017-01-07 DIAGNOSIS — T829XXD Unspecified complication of cardiac and vascular prosthetic device, implant and graft, subsequent encounter: Secondary | ICD-10-CM

## 2017-01-07 LAB — HEPATIC FUNCTION PANEL
ALBUMIN: 2.7 g/dL — AB (ref 3.5–5.0)
ALT: 800 U/L — ABNORMAL HIGH (ref 17–63)
AST: 1353 U/L — ABNORMAL HIGH (ref 15–41)
Alkaline Phosphatase: 63 U/L (ref 38–126)
BILIRUBIN INDIRECT: 1.8 mg/dL — AB (ref 0.3–0.9)
Bilirubin, Direct: 3.4 mg/dL — ABNORMAL HIGH (ref 0.1–0.5)
TOTAL PROTEIN: 5.3 g/dL — AB (ref 6.5–8.1)
Total Bilirubin: 5.2 mg/dL — ABNORMAL HIGH (ref 0.3–1.2)

## 2017-01-07 LAB — BLOOD GAS, ARTERIAL
Acid-Base Excess: 5.6 mmol/L — ABNORMAL HIGH (ref 0.0–2.0)
Bicarbonate: 29.9 mmol/L — ABNORMAL HIGH (ref 20.0–28.0)
DRAWN BY: 44135
FIO2: 40
MECHVT: 640 mL
O2 SAT: 94.1 %
PATIENT TEMPERATURE: 98.6
PCO2 ART: 46 mmHg (ref 32.0–48.0)
PEEP: 5 cmH2O
PH ART: 7.428 (ref 7.350–7.450)
RATE: 18 resp/min
pO2, Arterial: 71.9 mmHg — ABNORMAL LOW (ref 83.0–108.0)

## 2017-01-07 LAB — GLUCOSE, CAPILLARY
GLUCOSE-CAPILLARY: 183 mg/dL — AB (ref 65–99)
GLUCOSE-CAPILLARY: 192 mg/dL — AB (ref 65–99)
GLUCOSE-CAPILLARY: 206 mg/dL — AB (ref 65–99)
Glucose-Capillary: 189 mg/dL — ABNORMAL HIGH (ref 65–99)
Glucose-Capillary: 196 mg/dL — ABNORMAL HIGH (ref 65–99)
Glucose-Capillary: 223 mg/dL — ABNORMAL HIGH (ref 65–99)
Glucose-Capillary: 220 mg/dL — ABNORMAL HIGH (ref 65–99)

## 2017-01-07 LAB — CBC
HCT: 30.3 % — ABNORMAL LOW (ref 39.0–52.0)
Hemoglobin: 9.2 g/dL — ABNORMAL LOW (ref 13.0–17.0)
MCH: 28.9 pg (ref 26.0–34.0)
MCHC: 30.4 g/dL (ref 30.0–36.0)
MCV: 95.3 fL (ref 78.0–100.0)
PLATELETS: 123 10*3/uL — AB (ref 150–400)
RBC: 3.18 MIL/uL — ABNORMAL LOW (ref 4.22–5.81)
RDW: 20 % — AB (ref 11.5–15.5)
WBC: 15.1 10*3/uL — AB (ref 4.0–10.5)

## 2017-01-07 LAB — PROTIME-INR
INR: 2.35
PROTHROMBIN TIME: 26.1 s — AB (ref 11.4–15.2)

## 2017-01-07 LAB — HEPARIN LEVEL (UNFRACTIONATED)
HEPARIN UNFRACTIONATED: 1.18 [IU]/mL — AB (ref 0.30–0.70)
Heparin Unfractionated: 0.95 IU/mL — ABNORMAL HIGH (ref 0.30–0.70)

## 2017-01-07 LAB — BASIC METABOLIC PANEL
ANION GAP: 14 (ref 5–15)
ANION GAP: 15 (ref 5–15)
BUN: 51 mg/dL — ABNORMAL HIGH (ref 6–20)
BUN: 55 mg/dL — AB (ref 6–20)
CALCIUM: 7.9 mg/dL — AB (ref 8.9–10.3)
CHLORIDE: 91 mmol/L — AB (ref 101–111)
CO2: 28 mmol/L (ref 22–32)
CO2: 30 mmol/L (ref 22–32)
Calcium: 8.1 mg/dL — ABNORMAL LOW (ref 8.9–10.3)
Chloride: 89 mmol/L — ABNORMAL LOW (ref 101–111)
Creatinine, Ser: 5.33 mg/dL — ABNORMAL HIGH (ref 0.61–1.24)
Creatinine, Ser: 5.79 mg/dL — ABNORMAL HIGH (ref 0.61–1.24)
GFR calc Af Amer: 10 mL/min — ABNORMAL LOW (ref >60)
GFR calc non Af Amer: 9 mL/min — ABNORMAL LOW (ref >60)
GFR, EST AFRICAN AMERICAN: 11 mL/min — AB (ref >60)
GFR, EST NON AFRICAN AMERICAN: 9 mL/min — AB (ref >60)
Glucose, Bld: 385 mg/dL — ABNORMAL HIGH (ref 65–99)
Glucose, Bld: 241 mg/dL — ABNORMAL HIGH (ref 65–99)
POTASSIUM: 4.3 mmol/L (ref 3.5–5.1)
Potassium: 4.1 mmol/L (ref 3.5–5.1)
SODIUM: 136 mmol/L (ref 135–145)
Sodium: 131 mmol/L — ABNORMAL LOW (ref 135–145)

## 2017-01-07 LAB — POCT I-STAT 3, VENOUS BLOOD GAS (G3P V)
Acid-base deficit: 3 mmol/L — ABNORMAL HIGH (ref 0.0–2.0)
BICARBONATE: 21.5 mmol/L (ref 20.0–28.0)
O2 SAT: 81 %
TCO2: 22 mmol/L (ref 0–100)
pCO2, Ven: 33.6 mmHg — ABNORMAL LOW (ref 44.0–60.0)
pH, Ven: 7.413 (ref 7.250–7.430)
pO2, Ven: 44 mmHg (ref 32.0–45.0)

## 2017-01-07 LAB — COOXEMETRY PANEL
CARBOXYHEMOGLOBIN: 2.8 % — AB (ref 0.5–1.5)
METHEMOGLOBIN: 1.2 % (ref 0.0–1.5)
O2 SAT: 76.5 %
TOTAL HEMOGLOBIN: 9.6 g/dL — AB (ref 12.0–16.0)

## 2017-01-07 LAB — LACTIC ACID, PLASMA: LACTIC ACID, VENOUS: 2.8 mmol/L — AB (ref 0.5–1.9)

## 2017-01-07 LAB — MAGNESIUM: MAGNESIUM: 2.4 mg/dL (ref 1.7–2.4)

## 2017-01-07 LAB — APTT
APTT: 108 s — AB (ref 24–36)
aPTT: 75 seconds — ABNORMAL HIGH (ref 24–36)

## 2017-01-07 LAB — LACTATE DEHYDROGENASE: LDH: 2225 U/L — ABNORMAL HIGH (ref 98–192)

## 2017-01-07 MED ORDER — SODIUM CHLORIDE 0.9 % IV SOLN
2000.0000 mg | Freq: Once | INTRAVENOUS | Status: AC
  Administered 2017-01-07: 2000 mg via INTRAVENOUS
  Filled 2017-01-07: qty 2000

## 2017-01-07 MED ORDER — PIPERACILLIN-TAZOBACTAM 3.375 G IVPB
3.3750 g | Freq: Two times a day (BID) | INTRAVENOUS | Status: DC
  Administered 2017-01-07 (×2): 3.375 g via INTRAVENOUS
  Filled 2017-01-07 (×3): qty 50

## 2017-01-07 NOTE — Progress Notes (Addendum)
CRITICAL VALUE ALERT  Critical value received:  Lactic Acid 2.8   Date of notification:  01/07/17  Time of notification:  0640  Critical value read back: yes   Nurse who received alert:  A. Darcel Smalling RN  MD notified (1st page):  Dr. Concepcion Living CCM  Time of first page:  [phone] MD notified (2nd page):  Time of second page:  Responding MD:  Dr. Concepcion Living CCM  Time MD responded:  [phone]

## 2017-01-07 NOTE — Progress Notes (Signed)
Wasted 4mL Versed in trash. Witnessed by Everlene Other RN.

## 2017-01-07 NOTE — Progress Notes (Signed)
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   On 4/8 developed worsening shock refractory to inotropes. Received IV TPA with normalization of pump flow. T-PA stopped at midnight. Had one bloody BM but otherwise no apparent bleeding. Given 2u RBC overnight  Nephrology saw 01/06/17 and had long discussion with pt wife.  She stated with poor prognosis she wished not to proceed with CRRT.   However, able to wake off sedation, and nodded his head in agreement that he DOES want a trial of CRRT.   Remains intubated but intermittently awake on vent and following commands. Remains anuric.  No further VT.  Has been in Afib 120-130s. Remains on amio 60 gtt.     Discussed with wife. She now agrees with a CRRT trial and a longer "wait and see" period.    Now on epi 4, norepi 40, and vasopressin at 0.03. Remains on bicarb drip.   MAP 67 Coox 76.5%. Lactate 2.8.    K 4.3. Mg 2.4. Creatinine up to 5.79 from 3.35 yesterday am. LDH down to 2225 from 4954 yesterday.   LVAD INTERROGATION:  HeartMate II LVAD:  Flow 6.0 liters/min, speed 900, power 6  PI 3.7.  Objective:    Vital Signs:   Temp:  [98.3 F (36.8 C)-100.3 F (37.9 C)] 100.1 F (37.8 C) (04/10 0500) Pulse Rate:  [86-135] 128 (04/10 0715) Resp:  [13-21] 19 (04/10 0715) BP: (51-91)/(33-71) 72/55 (04/10 0715) SpO2:  [94 %-100 %] 96 % (04/10 0715) FiO2 (%):  [50 %-60 %] 50 % (04/10 0700) Weight:  [218 lb 14.7 oz (99.3 kg)-225 lb 8.5 oz (102.3 kg)] 225 lb 8.5 oz (102.3 kg) (04/10 0600) Last BM Date: 01/17/2017   Mean arterial Pressure 62  Intake/Output:   Intake/Output Summary (Last 24 hours) at 01/07/17 0724 Last data filed at 01/07/17 0700  Gross per 24 hour  Intake          4520.22 ml  Output              100 ml  Net          4420.22 ml     Physical Exam: General:  On vent. Intermittently awake and following commands. Sedated on fentanyl and versed.   HEENT: Normal. + ETT.  + scleral icterus.   Neck: Supprle. JVP elevated to jaw. Carotids 2+ bilat; no bruits. No thyromegaly or nodule noted.  Cor: Mechanical heart sounds with LVAD hum present. LVAD sounds OK. Heart sounds more prominent. No pump groan.  Lungs: Mild crackles throughout.  Abdomen: Soft, non-tender, + distended. No bruits or masses. Hypoactive bowel sounds. Driveline: C/D/I; securement device intact and driveline incorporated. Site clear.  Extremities: No cyanosis, clubbing, rash, Trace to 1 plus peripheral edema.  Neuro: Awake at times but remains sedated on vent.  Currently alert and oriented to person. Cranial nerves grossly  cranial nerves grossly intact. moves all 4 extremities w/o difficulty.   Telemetry: Personally reviewed, Afib 120-130s  Labs: Basic Metabolic Panel:  Recent Labs Lab 01/05/17 1036  01/06/17 0427 01/06/17 0500  01/06/17 1300 01/06/17 1815 01/06/17 2237 01/07/17 0219 01/07/17 0529  NA 139  < >  --  137  < > 141 140 132* 131* 136  K 5.4*  < >  --  4.2  < > 4.3 4.4 4.1 4.1 4.3  CL 109  < >  --  95*  < > 98* 97* 91* 89* 91*  CO2 21*  < >  --  23  < > '30 30 28 28 30  '$ GLUCOSE 151*  < >  --  418*  < > 174* 192* 379* 385* 241*  BUN 34*  < >  --  41*  < > 49* 51* 50* 51* 55*  CREATININE 2.38*  < >  --  3.97*  < > 4.68* 5.08* 5.13* 5.33* 5.79*  CALCIUM 9.0  < >  --  8.7*  < > 8.8* 8.8* 8.0* 7.9* 8.1*  MG 2.0  --  1.5* 1.5*  --  2.5*  --   --   --  2.4  < > = values in this interval not displayed.  Liver Function Tests:  Recent Labs Lab 12/29/2016 1913 01/06/17 0500 01/07/17 0529  AST 128* 2,471* 1,353*  ALT 20 804* 800*  ALKPHOS 50 54 63  BILITOT 3.0* 7.1* 5.2*  PROT 6.7 5.4* 5.3*  ALBUMIN 3.4* 2.8* 2.7*   No results for input(s): LIPASE, AMYLASE in the last 168 hours. No results for input(s): AMMONIA in the last 168 hours.  CBC:  Recent Labs Lab 01/20/2017 1913  01/05/17 1037  01/05/17 2029 01/05/17 2145 01/06/17 0500 01/06/17 0913 01/07/17 0529  WBC  10.5  < > 13.6*  --   --  23.0* 22.9* 22.4* 15.1*  NEUTROABS 9.1*  --   --   --   --   --   --   --   --   HGB 9.8*  < > 9.4*  < > 7.8* 6.9* 9.6* 10.3* 9.2*  HCT 34.1*  < > 30.8*  < > 23.0* 24.7* 32.6* 33.6* 30.3*  MCV 100.3*  < > 100.0  --   --  107.9* 98.8 97.4 95.3  PLT 154  < > 164  --   --  160 160 155 123*  < > = values in this interval not displayed.  INR: No results for input(s): INR in the last 168 hours.  Other results:    Imaging: Ct Chest Wo Contrast  Result Date: 01/05/2017 CLINICAL DATA:  Acute on chronic systolic CHF. LVAD thrombosis and pump malfunction. Evaluate outflow graft for mechanical obstruction/kinking. No contrast with acute renal failure. EXAM: CT CHEST WITHOUT CONTRAST TECHNIQUE: Multidetector CT imaging of the chest was performed following the standard protocol without IV contrast. COMPARISON:  Chest CT dated 01/21/2013. Chest x-ray from earlier same day. FINDINGS: Cardiovascular: Left ventricular assist device appears stable in position, inflow cannula at the apex of the left ventricle, mildly angulated, stable in position and configuration. Left ventricular anastomosis and aortic anastomosis appears stable compared to the previous exam. No acute findings relative to the LVAD, although characterization is limited without IV contrast. Cardiomegaly appears stable. No pericardial effusion. No aortic aneurysm or dissection. Aortic atherosclerosis.  Coronary artery calcifications. Mediastinum/Nodes: Scattered small lymph nodes within the mediastinum. No enlarged or morphologically abnormal lymph nodes identified within the mediastinum or perihilar regions. Lungs/Pleura: Patchy consolidations at each lung base, most likely atelectasis. Mild interstitial edema bilaterally, perihilar and upper lobe predominant. Mild emphysematous changes again noted, upper lobe predominant. Bilateral calcified pleural plaques again noted, presumably related to previous asbestos exposure. Upper  Abdomen: Cirrhotic appearing liver. Cholelithiasis without evidence of acute cholecystitis, incompletely imaged. Small amount of free fluid/ascites within the upper abdomen. Musculoskeletal: No acute or suspicious osseous finding. Median sternotomy wires in place. IMPRESSION: 1. Compared to CT of 01/21/2013, LVAD appears stable in position and configuration. Surrounding soft tissues appear stable. 2.  Cardiomegaly with perihilar and upper lobe edema indicating mild CHF/volume overload. 3. Patchy small consolidations at each lung base, likely atelectasis. 4. Small right pleural effusion and trace left pleural effusion. 5. Emphysematous changes, upper lobe predominant. 6. Aortic atherosclerosis. 7. Cirrhotic liver. 8. Cholelithiasis. 9. Small amount of free fluid/ascites in the upper abdomen. Electronically Signed   By: Franki Cabot M.D.   On: 01/05/2017 13:26   Dg Chest Port 1 View  Result Date: 01/06/2017 CLINICAL DATA:  76 year old male with endotracheal tube in place. Subsequent encounter. EXAM: PORTABLE CHEST 1 VIEW COMPARISON:  01/05/2017. FINDINGS: Endotracheal tube tip 4.5 cm above the carina. Right central line tip mid superior vena cava level. Post CABG.  Cardiomegaly. Pulmonary vascular congestion with small pleural effusions. Basilar atelectasis. Findings without significant change. IMPRESSION: Similar appearance of pulmonary vascular congestion, small pleural effusions, basilar subsegmental atelectasis and cardiomegaly. Electronically Signed   By: Genia Del M.D.   On: 01/06/2017 07:24   Dg Chest Port 1 View  Result Date: 01/05/2017 CLINICAL DATA:  Endotracheal tube placement.  Initial encounter. EXAM: PORTABLE CHEST 1 VIEW COMPARISON:  Chest radiograph and CT of the chest performed earlier today at 12:56 p.m. FINDINGS: The patient's endotracheal tube is seen ending 2-3 cm above the carina. A right PICC is noted ending about the distal SVC. The lungs are well-aerated. Vascular congestion is  noted. Increased interstitial markings raise concern for pulmonary edema, increased from the prior studies. Small bilateral pleural effusions are suspected. There is no evidence of pneumothorax. The cardiomediastinal silhouette is within normal limits. No acute osseous abnormalities are seen. An external pacing pad is noted. IMPRESSION: 1. Endotracheal tube seen ending 2-3 cm above the carina. 2. Vascular congestion. Increased interstitial markings raise concern for pulmonary edema, more prominent than on the prior studies. Small bilateral pleural effusions suspected. Electronically Signed   By: Garald Balding M.D.   On: 01/05/2017 21:50   Dg Chest Port 1 View  Result Date: 01/05/2017 CLINICAL DATA:  Confirm line placement. History of atrial fibrillation, CHF, heart failure, COPD, hypertension, myocardial infarction, defibrillator. EXAM: PORTABLE CHEST 1 VIEW COMPARISON:  Chest x-ray dated 01/20/2017. FINDINGS: Right-sided PICC line has been placed with tip appropriately positioned at the level of the lower SVC. Cardiomegaly appears stable. Left ventricular assist device in place. Stable central pulmonary vascular congestion and bilateral interstitial edema. Lungs are at least mildly hyperexpanded suggesting COPD. No pleural effusion or pneumothorax seen. IMPRESSION: 1. Right-sided PICC line in place with tip appropriately positioned at the level of the lower SVC. 2. Cardiomegaly with central pulmonary vascular congestion and bilateral interstitial edema suggesting CHF/volume overload, similar to yesterday's exam. Electronically Signed   By: Franki Cabot M.D.   On: 01/05/2017 10:58   Dg Abd Portable 1v  Result Date: 01/07/2017 CLINICAL DATA:  Orogastric tube placement.  Initial encounter. EXAM: PORTABLE ABDOMEN - 1 VIEW COMPARISON:  Chest radiograph performed 01/06/2017 FINDINGS: The patient's enteric tube is noted ending overlying the antrum of the stomach. The stomach is largely filled with air. A left  ventricular assist device is noted. The patient is status post median sternotomy. External pacing pads are noted. No acute osseous abnormalities are identified. IMPRESSION: Enteric tube noted ending overlying the antrum of the stomach. Stomach largely filled with air. Electronically Signed   By: Garald Balding M.D.   On: 01/07/2017 01:27     Medications:     Scheduled Medications: . arformoterol  15 mcg Nebulization BID  . budesonide (PULMICORT) nebulizer solution  0.25 mg Nebulization BID  . chlorhexidine gluconate (MEDLINE KIT)  15 mL Mouth Rinse BID  . Chlorhexidine Gluconate Cloth  6 each Topical Daily  . furosemide  160 mg Intravenous Q6H  . insulin aspart  1-3 Units Subcutaneous Q4H  . levothyroxine  50 mcg Intravenous QHS  . mouth rinse  15 mL Mouth Rinse 10 times per day  . pantoprazole (PROTONIX) IV  40 mg Intravenous Q12H  . sodium chloride flush  10-40 mL Intracatheter Q12H  . sodium chloride flush  3 mL Intravenous Q12H    Infusions: . amiodarone 60 mg/hr (01/07/17 0700)  . epinephrine 4 mcg/min (01/07/17 0700)  . fentaNYL infusion INTRAVENOUS 25 mcg/hr (01/07/17 0700)  . heparin 1,000 Units/hr (01/07/17 0700)  . midazolam (VERSED) infusion 0.5 mg/hr (01/07/17 0700)  . norepinephrine (LEVOPHED) Adult infusion 40 mcg/min (01/07/17 0700)  .  sodium bicarbonate  infusion 1000 mL 75 mL/hr at 01/07/17 0048  . vasopressin (PITRESSIN) infusion - *FOR SHOCK* 0.03 Units/min (01/07/17 0700)    PRN Medications: sodium chloride, acetaminophen (TYLENOL) oral liquid 160 mg/5 mL **OR** acetaminophen, albuterol, fentaNYL, midazolam, ondansetron (ZOFRAN) IV, sodium chloride flush, sodium chloride flush   Assessment:   1. 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 Coox 76%. Would wean epi off first as tolerated, but MAPs remain very soft currently.    - pH 7.3 on blood  gas yesterday.  Remains on bicarb drip.  2. LVAD pump thrombosis   -s/p t-pa infusion 4/8. Now on heparin      - Pump parameters much improved. LDH trending down.  Now dealing with sequelae and end-ordgan damage as below.  3. Acute on chronic systolic HF     --HMII implanted 01/12/13     --Echo 4/8 LVEF 30-35% RV moderately decreased     - Remains markedly volume overloaded with pulmonary interstitial edema on CXR this am, Reviewed personally.      - Continue high dose lasix as below.  Will discuss CVVHD with Nephrology.  Pt wishes for short trial   4. Chronic AF with RVR     - Rate somewhat elevated on amio gtt. No good response to re-bolus.      - No role for BB in acute shock.       - Do not think he is stable enough at this time to consider DCCV.   Continue to follow.  5. AKI     - Hemodynamically mediated due to shock/ATN     - Continue high dose lasix at 160 mg q 6 hrs for now.       - Will discuss initiation of CVVD with renal, now that he is far enough s/p t-pa for cath placement.  6. Shock liver      - AST down to 1353 from 2471 yesterday, ALT remains elevated at 800 from 804 yesterday.       - Continue supportive care.   7. Hypokalemia/Hypomag      - Resolved with supp. Continue to follow closely.  8. VT     -s/p defib x 1 4/9.      - Electrolytes much improved with supp. Continue amio gtt for now.  9. Acute Gi bleed in setting of t-PA     -s/p 2u RBCs on 4/9. Hgb stable at 9.2 this am.   10. CAD s/p CABG 2003     -  Stable. No change to current plan.   Discussed case with Dr. Florene Glen with nephrology. They will see this am to discuss initiation of CRRT.   I reviewed the LVAD parameters from today, and compared the results to the patient's prior recorded data.  No programming changes were made.  The LVAD is functioning within specified parameters at this time. Nurse performs performs LVAD self-test daily.  LVAD interrogation was negative for any significant power changes, alarms  or PI events/speed drops with in past 24 hours. LVAD equipment check completed and is in good working order.  Back-up equipment present.   LVAD education done on emergency procedures and precautions and reviewed exit site care.   Length of Stay: 3  Annamaria Helling  01/07/2017, 7:24 AM  VAD Team --- VAD ISSUES ONLY--- Pager [phone] (7am - 7am)  Advanced Heart Failure Team  Pager [phone] (M-F; 7a - 4p)  Please contact [facility] for night-coverage after hours (4p -7a ) and weekends on [email or web address]  Patient seen with PA, agree with the above note.    Mr Tietje remains very tenuous.  However, he awakens on vent and has requested trial of CVVH.  His wife and I discussed this this morning.    Pump parameters are improved: LDH is down, no alarms.  However, he has sustained significant end organ damage with shock liver, AKI, and persistent hypotension.  - Continue heparin gtt.   He remains anuric.  Renal has seen.  He is not acidotic, he is oxygenating well, and K is maintained currently.  Given unstable BP, renal wants to wait until tomorrow potentially to start CVVH which is reasonable.  Can stop Lasix for now given lack of response.   MAP upper 60s on norepinephrine, epinephrine, and vasopressin.  No room for weaning at this point, can increase epinephrine as needed to keep MAP > 65.   Atrial fibrillation around 130 currently.  Continue amiodarone gtt, would not intervene otherwise at this point.    CRITICAL CARE Performed by: Loralie Champagne  Total critical care time: 45 minutes  Critical care time was exclusive of separately billable procedures and treating other patients.  Critical care was necessary to treat or prevent imminent or life-threatening deterioration.  Critical care was time spent personally by me on the following activities: development of treatment plan with patient and/or surrogate as well as nursing, discussions with consultants, evaluation of patient's  response to treatment, examination of patient, obtaining history from patient or surrogate, ordering and performing treatments and interventions, ordering and review of laboratory studies, ordering and review of radiographic studies, pulse oximetry and re-evaluation of patient's condition.  Loralie Champagne 01/07/2017 9:20 AM

## 2017-01-07 NOTE — Plan of Care (Signed)
Paged Magda Paganini VAD coordinator about escalation of epi and levo drips, pharmacy gave new parameters for titration, current MAP 76

## 2017-01-07 NOTE — Progress Notes (Signed)
PULMONARY  / CRITICAL CARE MEDICINE  Name: [name] MRN: [id number] DOB: Oct 22, 1940    LOS: 17  REFERRING MD :  Dr. Haroldine Laws  CHIEF COMPLAINT:  Respiratory distress  BRIEF PATIENT DESCRIPTION: [name] is a  76 year old man with chronic systolic CHF s/p LVAD complicated by anemia, OSA on CPAP, emphysema hospitalized for LVAD thrombosis s/p tPA complicated by cardiogenic shock, acute hypoxic respiratory failure on ventilatory support, oliguric acute renal failure.  HISTORY OF PRESENT ILLNESS:  Information obtained from medical record as patient is intubated and sedated. 76 year old male with PMH of anemia, A.Fib, CAD s/p CABG (2003), liver cirrhosis, chronic systolic HF s/p LVAD 01/6255 (complicated by recurrent anemia, switched from coumadin to Eliquis), emphysema, OSA (on CPAP). Patient presents 4/7 with reported dyspnea and 5 lbs weight gain. On arrival to ED CXR revealed mild pulmonary edema, cardiology concerned with pump dysfunction as parameters seemed disproportioned to CXR. 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 Started on Low dose t-pa protocol. Later in the day was tachycardiac, tachpneic, and diaphoretic, developed nausea, was intubated. PCCM asked to consult for vent management.    INTERVAL HISTORY: became tachycardic yesterday requiring amiodarone bolus. Still requiring levo, epi, vaso. Renal function worsening. Initially it was felt CRRT would have no change in outcome, but now he is somewhat improved so patient and nephrology now in favor of short trial.   PAST MEDICAL HISTORY :  Past Medical History:  Diagnosis Date  . Anemia   . Asbestosis(501)    "6 years in the [place]" (05/24/2013)  . Atrial fibrillation ([place])    permanent  . Cancer ([place])    "scraped some off behind my left ear; fast moving; having it cut out 12/05/2015" (11/07/2015)  . CHF (congestive heart failure) ([place])   . Chronic anticoagulation    Afib and LVAD  . Chronic systolic heart failure ([place])   . Cirrhosis of  liver (HCC)    due to the numerous times of being put to sleep  . Complication of anesthesia    "they can't put me all the way to sleep cause of my heart" (11/07/2015)  . COPD (chronic obstructive pulmonary disease) ([place])   . Coronary artery disease   . Depression   . Epistaxis 11/2015, 07/2014  . History of blood transfusion 08/2015 X 10; 11/2015 X 2   "related to bleeding on the inside somewhere" (11/07/2015)  . Hyperlipidemia   . Hypertension   . Hypothyroidism   . Ischemic cardiomyopathy     CABG 2003, PCI 2007  EF 27%(myoview 2012  . LVAD (left ventricular assist device) present ([place]) 12/2012  . Myocardial infarction 1990's-2000   "2 in  ~ the 1990's; 1 in ~ 2000" (11/07/2015)  . Obesity   . On home oxygen therapy    "have it available; don't use it" (11/07/2015)  . OSA on CPAP    no longer on cpap  . Paroxysmal ventricular tachycardia ([place])   . Restless leg syndrome   . Shortness of breath dyspnea    Past Surgical History:  Procedure Laterality Date  . APPENDECTOMY    . CARDIAC DEFIBRILLATOR PLACEMENT  2004; ~ 2010   "cut it out in 2016 after it got infected"   . COLONOSCOPY N/A 03/09/2014   Procedure: COLONOSCOPY;  Surgeon: Inda Castle, MD;  Location: [place];  Service: Endoscopy;  Laterality: N/A;  LVAD  patient  . COLONOSCOPY N/A 07/29/2014   Procedure: COLONOSCOPY;  Surgeon: Gatha Mayer, MD;  Location:  [facility] END[facility]COPY;  Service: Endoscopy;  Laterality: N/A;  . CORONARY ARTERY BYPASS GRAFT  2003   "?X4" (05/24/2013)  . ENTEROSCOPY N/A 07/27/2014   Procedure: ENTEROSCOPY;  Surgeon: Gatha Mayer, MD;  Location: [facility] ENDOSCOPY;  Service: Endoscopy;  Laterality: N/A;  LVAD patient   . ENTEROSCOPY N/A 09/27/2014   Procedure: ENTEROSCOPY;  Surgeon: Gatha Mayer, MD;  Location: [facility] ENDOSCOPY;  Service: Endoscopy;  Laterality: N/A;  . ENTEROSCOPY N/A 11/09/2015   Procedure: ENTEROSCOPY;  Surgeon: Mauri Pole, MD;  Location: [facility] ENDOSCOPY;  Service: Endoscopy;   Laterality: N/A;  LVAD  . ENTEROSCOPY N/A 12/13/2015   Procedure: ENTEROSCOPY;  Surgeon: Milus Banister, MD;  Location: [place];  Service: Endoscopy;  Laterality: N/A;  . ENTEROSCOPY N/A 02/21/2016   Procedure: ENTEROSCOPY;  Surgeon: Milus Banister, MD;  Location: [place];  Service: Endoscopy;  Laterality: N/A;  . ERCP N/A 06/13/2016   Procedure: ENDOSCOPIC RETROGRADE CHOLANGIOPANCREATOGRAPHY (ERCP);  Surgeon: Milus Banister, MD;  Location: [place];  Service: Endoscopy;  Laterality: N/A;  . ESOPHAGOGASTRODUODENOSCOPY N/A 03/09/2014   Procedure: ESOPHAGOGASTRODUODENOSCOPY (EGD);  Surgeon: Inda Castle, MD;  Location: [place];  Service: Endoscopy;  Laterality: N/A;  . ESOPHAGOGASTRODUODENOSCOPY N/A 08/01/2014   Procedure: ESOPHAGOGASTRODUODENOSCOPY (EGD);  Surgeon: Jerene Bears, MD;  Location: [facility] ENDOSCOPY;  Service: Endoscopy;  Laterality: N/A;  LVAD patient  . GIVENS CAPSULE STUDY N/A 03/10/2014   Procedure: GIVENS CAPSULE STUDY;  Surgeon: Inda Castle, MD;  Location: [place];  Service: Endoscopy;  Laterality: N/A;  . GIVENS CAPSULE STUDY N/A 07/30/2014   Procedure: GIVENS CAPSULE STUDY;  Surgeon: Gatha Mayer, MD;  Location: [place];  Service: Endoscopy;  Laterality: N/A;  . GIVENS CAPSULE STUDY N/A 12/11/2015   Procedure: GIVENS CAPSULE STUDY;  Surgeon: Gatha Mayer, MD;  Location: [place];  Service: Endoscopy;  Laterality: N/A;  . ICD LEAD REMOVAL Left 03/16/2015   Procedure: ICD LEAD REMOVAL/EXTRACTION;  Surgeon: Evans Lance, MD;  Location: [place];  Service: Cardiovascular;  Laterality: Left;  PATIENT HAS LVAD  DR. VAN TRIGT TO BACK UP EXTRACTION  . INSERTION OF IMPLANTABLE LEFT VENTRICULAR ASSIST DEVICE N/A 01/12/2013   Procedure: INSERTION OF IMPLANTABLE LEFT VENTRICULAR ASSIST DEVICE;  Surgeon: Ivin Poot, MD;  Location: [place];  Service: Open Heart Surgery;  Laterality: N/A;   nitric oxide; Redo sternotomy  . INTRAOPERATIVE TRANSESOPHAGEAL  ECHOCARDIOGRAM N/A 01/12/2013   Procedure: INTRAOPERATIVE TRANSESOPHAGEAL ECHOCARDIOGRAM;  Surgeon: Ivin Poot, MD;  Location: [place];  Service: Open Heart Surgery;  Laterality: N/A;  . IR GENERIC HISTORICAL  07/23/2016   IR SINUS/FIST TUBE CHK-NON GI 07/23/2016 Arne Cleveland, MD [facility]INTER[facility] RAD  . IR GENERIC HISTORICAL  05/14/2016   IR RADIOLOGIST EVAL & MGMT 05/14/2016 [facility] INTERV RAD  . LEFT AND RIGHT HEART CATHETERIZATION WITH CORONARY/GRAFT ANGIOGRAM  01/07/2013   Procedure: LEFT AND RIGHT HEART CATHETERIZATION WITH Beatrix Fetters;  Surgeon: Jolaine Artist, MD;  Location: [facility] CATH LAB;  Service: Cardiovascular;;   Prior to Admission medications   Medication Sig Start Date End Date Taking? Authorizing Provider  albuterol (PROVENTIL) (2.5 MG/3ML) 0.083% nebulizer solution Take 2.5 mg by nebulization every 6 (six) hours as needed for wheezing or shortness of breath.   Yes Historical Provider, MD  amiodarone (PACERONE) 200 MG tablet Take 1 tablet (200 mg total) by mouth every evening. Patient taking differently: Take 200 mg by mouth at bedtime.  03/29/16  Yes Larey Dresser, MD  apixaban Arne Cleveland)  5 MG TABS tablet Take 1 tablet (5 mg total) by mouth 2 (two) times daily. 12/25/15  Yes Jolaine Artist, MD  budesonide-formoterol [facility]) 160-4.5 MCG/ACT inhaler Inhale 2 puffs into the lungs 2 (two) times daily. 01/10/15  Yes Jolaine Artist, MD  cholecalciferol (VITAMIN D) 1000 UNITS tablet Take 1 tablet (1,000 Units total) by mouth 2 (two) times daily. 01/10/15  Yes Jolaine Artist, MD  citalopram (CELEXA) 40 MG tablet Take 1 tablet (40 mg total) by mouth at bedtime. 01/10/15  Yes Jolaine Artist, MD  docusate sodium (COLACE) 100 MG capsule Take 200 mg by mouth 2 (two) times daily.    Yes Historical Provider, MD  ferumoxytol 510 mg in sodium chloride 0.9 % 100 mL Inject 510 mg into the vein as needed (low iron - checked every 2 weeks).   Yes Historical Provider, MD   furosemide (LASIX) 40 MG tablet Use only if a weight gain of 5 pounds in a week occurs or evident swelling is present Patient taking differently: Take 40 mg by mouth daily as needed (for weight gain of 5 lb in <3 days or for ankle swelling).  03/29/16  Yes Larey Dresser, MD  hydrocortisone (ANUSOL-HC) 2.5 % rectal cream Place 1 application rectally 2 (two) times daily. Patient taking differently: Place 1 application rectally daily as needed for hemorrhoids or itching.  11/21/15  Yes Larey Dresser, MD  levothyroxine (SYNTHROID, LEVOTHROID) 100 MCG tablet Take 1 tablet (100 mcg total) by mouth at bedtime. 02/23/16  Yes Amy D Clegg, NP  losartan (COZAAR) 25 MG tablet Take 25 mg by mouth at bedtime.   Yes Historical Provider, MD  magnesium hydroxide (MILK OF MAGNESIA) 400 MG/5ML suspension Take 30 mLs by mouth daily as needed (constipation).   Yes Historical Provider, MD  Multiple Vitamin (MULTIVITAMIN WITH MINERALS) TABS tablet Take 1 tablet by mouth at bedtime.    Yes Historical Provider, MD  nitroGLYCERIN (NITROSTAT) 0.4 MG SL tablet Place 0.4 mg under the tongue every 5 (five) minutes as needed for chest pain. Reported on 04/09/2016   Yes Historical Provider, MD  octreotide (SANDOSTATIN LAR) 20 MG injection Inject 20 mg into the muscle every 28 (twenty-eight) days. 08/29/16 01/17/17 Yes Shaune Pascal Bensimhon, MD  pantoprazole (PROTONIX) 40 MG tablet Take 1 tablet (40 mg total) by mouth 2 (two) times daily. 01/10/15  Yes Shaune Pascal Bensimhon, MD  potassium chloride SA (K-DUR,KLOR-CON) 20 MEQ tablet Take 2 tablets (40 mEq total) by mouth daily. And take extra 1 pill with lasix when needed. Patient taking differently: Take 20 mEq by mouth See admin instructions. Take 1 tablet (20 meq) by mouth twice daily/ take 2 extra tablets with each dose of Lasix 04/30/16  Yes Amy D Clegg, NP  PRESCRIPTION MEDICATION Inhale into the lungs at bedtime. CPAP   Yes Historical Provider, MD  simvastatin (ZOCOR) 80 MG tablet Take  0.5 tablets (40 mg total) by mouth at bedtime. 01/10/15  Yes Jolaine Artist, MD  tiotropium (SPIRIVA) 18 MCG inhalation capsule Place 1 capsule (18 mcg total) into inhaler and inhale daily. 01/10/15  Yes Jolaine Artist, MD  vitamin B-12 (CYANOCOBALAMIN) 1000 MCG tablet Take 1,000 mcg by mouth at bedtime.   Yes Historical Provider, MD  vitamin C (ASCORBIC ACID) 500 MG tablet Take 1 tablet (500 mg total) by mouth 2 (two) times daily. 01/10/15  Yes Jolaine Artist, MD   Allergies  Allergen Reactions  . Tape Other (See Comments)  Tape will tear with skin    FAMILY HISTORY:  Family History  Problem Relation Age of Onset  . Heart attack Mother   . Heart attack Father    SOCIAL HISTORY:  reports that he quit smoking about 15 years ago. His smoking use included Cigarettes. He has a 90.00 pack-year smoking history. He has never used smokeless tobacco. He reports that he does not drink alcohol or use drugs.  REVIEW OF SYSTEMS:  Unable to assess as patient is intubated.  VITAL SIGNS: Temp:  [98.3 F (36.8 C)-100.3 F (37.9 C)] 99.8 F (37.7 C) (04/10 0845) Pulse Rate:  [91-138] 138 (04/10 0930) Resp:  [16-26] 18 (04/10 0930) BP: (68-95)/(37-72) 95/72 (04/10 0930) SpO2:  [94 %-98 %] 95 % (04/10 0930) FiO2 (%):  [50 %] 50 % (04/10 0700) Weight:  [102.3 kg (225 lb 8.5 oz)] 102.3 kg (225 lb 8.5 oz) (04/10 0600) HEMODYNAMICS: CVP:  [16 mmHg-18 mmHg] 16 mmHg VENTILATOR SETTINGS: Vent Mode: PRVC FiO2 (%):  [50 %] 50 % Set Rate:  [18 bmp] 18 bmp Vt Set:  [640 mL] 640 mL PEEP:  [5 cmH20] 5 cmH20 Plateau Pressure:  [18 cmH20-23 cmH20] 23 cmH20 INTAKE / OUTPUT: Intake/Output      04/09 0701 - 04/10 0700 04/10 0701 - 04/11 0700   I.V. (mL/kg) 4222.2 (41.3) 230.5 (2.3)   Blood     IV Piggyback 298    Total Intake(mL/kg) 4520.2 (44.2) 230.5 (2.3)   Urine (mL/kg/hr) 0 (0) 0 (0)   Emesis/NG output 100 (0)    Total Output 100 0   Net +4420.2 +230.5          PHYSICAL EXAMINATION:   Physical Exam  Constitutional: He has a sickly appearance. No distress.  Ill-appearing, elderly man.  HENT:  Head: Normocephalic and atraumatic.  Intubated.  Eyes: Conjunctivae are normal. Pupils are equal, round, and reactive to light.  Neck: JVD present.  Cardiovascular:  LVAD in place. Machine sound heard on cardiac auscultation.  Pulmonary/Chest: Effort normal. No accessory muscle usage. No respiratory distress. He has rhonchi.  Abdominal: Soft. He exhibits no distension. Bowel sounds are hypoactive.  Neurological:  Sedated  Skin: He is not diaphoretic.    LABS: Cbc  Recent Labs Lab 01/06/17 0500 01/06/17 0913 01/07/17 0529  WBC 22.9* 22.4* 15.1*  HGB 9.6* 10.3* 9.2*  HCT 32.6* 33.6* 30.3*  PLT 160 155 123*    Chemistry   Recent Labs Lab 01/06/17 0500  01/06/17 1300  01/06/17 2237 01/07/17 0219 01/07/17 0529  NA 137  < > 141  < > 132* 131* 136  K 4.2  < > 4.3  < > 4.1 4.1 4.3  CL 95*  < > 98*  < > 91* 89* 91*  CO2 23  < > 30  < > 28 28 30   BUN 41*  < > 49*  < > 50* 51* 55*  CREATININE 3.97*  < > 4.68*  < > 5.13* 5.33* 5.79*  CALCIUM 8.7*  < > 8.8*  < > 8.0* 7.9* 8.1*  MG 1.5*  --  2.5*  --   --   --  2.4  GLUCOSE 418*  < > 174*  < > 379* 385* 241*  < > = values in this interval not displayed.  Liver fxn  Recent Labs Lab 01/15/2017 1913 01/06/17 0500 01/07/17 0529  AST 128* 2,471* 1,353*  ALT 20 804* 800*  ALKPHOS 50 54 63  BILITOT 3.0* 7.1* 5.2*  PROT 6.7  5.4* 5.3*  ALBUMIN 3.4* 2.8* 2.7*   coags  Recent Labs Lab 01/06/17 0830 01/06/17 1815 01/07/17 0530  APTT 161* 119* 108*   Sepsis markers  Recent Labs Lab 01/05/17 2145 01/06/17 1300 01/07/17 0530  LATICACIDVEN 11.4* 2.4* 2.8*   ABG  Recent Labs Lab 01/05/17 2029 01/05/17 2103 01/06/17 0451 01/07/17 0414  PHART  --   --   --  7.428  PCO2ART  --   --   --  46.0  PO2ART  --   --   --  71.9*  HCO3  --  16.1* 23.0 29.9*  TCO2 17 18 24   --     CBG trend  Recent  Labs Lab 01/06/17 1600 01/06/17 2048 01/07/17 0007 01/07/17 0419 01/07/17 0738  GLUCAP 190* 177* 183* 189* 196*    LINES / TUBES: 4/8 R PICC 4/8 Foley    SIGNIFICANT EVENTS:  4/8 Admitted. Received tPA bolus and infusion for thombosis.   DIAGNOSES: Principal Problem:   Acute on chronic systolic congestive heart failure (HCC) Active Problems:   Atrial fibrillation (HCC)   LVAD (left ventricular assist device) present (HCC)   Acute on chronic systolic CHF (congestive heart failure) (HCC)   Cardiogenic shock (HCC)   ASSESSMENT / PLAN:  Acute hypoxic respiratory failure: Pulmonary edema due to LVAD malfunction/thombosis, now renal failure. -Continue ventilatory support -VAP bundle -Intermittent CXR -ABG as clinically indicated  COPD without acute exacerbation -Scheduled atrovent, xopenex. PRN xopenex.  Cardiogenic shock: 2/2 LVAD thrombosis. Received thrombolytics on admit.  - Advanced heart failure team primary - MAP goal per cardiology - Continue levo, epi, vaso for MAP goal - LVAD in place  Atrial fibrillation on chronic anticoagulation: one episode RVR 4/9 - Heparin infusion - Amiodarone infusion  VT: s/p defibrillation. Resolved after amiodarone.  Acute oliguric renal failure - borderline CRRT candidate due to hemodynamic instability. Family OK with pursuing if patient stabilizes to a point he can tolerate. - Follow BMP - Strict I&O  Acute on chronic anemia: Hb 9.6, up from 6.9 on admission. s/p pRBC x 2. 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

## 2017-01-07 NOTE — Progress Notes (Signed)
Assessment 19M with anuric AKI in setting of cardiogenic shock on 3 pressors and LVAD thrombosis  1. Anuric AKI, suspected ATN from shock 2. Cardiogenic Shock from LVAD malfunction 3. Chronic CHF with LVAD 4. ASCVD 5. COPD with VDRF 6. Anemia, acute on chroni with ABLA 7. Alkalosis  Plan I think he is too unstable and well oxygenated to need to do CRRT now.   I would like BP higher to start CRRT more safely.  Will stop bicarb.  Objective: Vital signs in last 24 hours: Temp:  [98.3 F (36.8 C)-100.3 F (37.9 C)] 100.1 F (37.8 C) (04/10 0500) Pulse Rate:  [86-135] 125 (04/10 0800) Resp:  [13-26] 18 (04/10 0800) BP: (51-91)/(33-71) 79/60 (04/10 0800) SpO2:  [94 %-98 %] 95 % (04/10 0800) FiO2 (%):  [50 %] 50 % (04/10 0700) Weight:  [102.3 kg (225 lb 8.5 oz)] 102.3 kg (225 lb 8.5 oz) (04/10 0600) Weight change:   Intake/Output from previous day: 04/09 0701 - 04/10 0700 In: 4520.2 [I.V.:4222.2; IV Piggyback:298] Out: 100 [Emesis/NG output:100] Intake/Output this shift: Total I/O In: 151.3 [I.V.:151.3] Out: 0   General appearance: slowed mentation and awakens Resp: clear to auscultation bilaterally Cardio: hum Extremities: extremities normal, atraumatic, no cyanosis or edema  Lab Results:  Recent Labs  01/06/17 0913 01/07/17 0529  WBC 22.4* 15.1*  HGB 10.3* 9.2*  HCT 33.6* 30.3*  PLT 155 123*   BMET:  Recent Labs  01/07/17 0219 01/07/17 0529  NA 131* 136  K 4.1 4.3  CL 89* 91*  CO2 28 30  GLUCOSE 385* 241*  BUN 51* 55*  CREATININE 5.33* 5.79*  CALCIUM 7.9* 8.1*   No results for input(s): PTH in the last 72 hours. Iron Studies: No results for input(s): IRON, TIBC, TRANSFERRIN, FERRITIN in the last 72 hours. Studies/Results: Ct Chest Wo Contrast  Result Date: 01/05/2017 CLINICAL DATA:  Acute on chronic systolic CHF. LVAD thrombosis and pump malfunction. Evaluate outflow graft for mechanical obstruction/kinking. No contrast with acute renal failure.  EXAM: CT CHEST WITHOUT CONTRAST TECHNIQUE: Multidetector CT imaging of the chest was performed following the standard protocol without IV contrast. COMPARISON:  Chest CT dated 01/21/2013. Chest x-ray from earlier same day. FINDINGS: Cardiovascular: Left ventricular assist device appears stable in position, inflow cannula at the apex of the left ventricle, mildly angulated, stable in position and configuration. Left ventricular anastomosis and aortic anastomosis appears stable compared to the previous exam. No acute findings relative to the LVAD, although characterization is limited without IV contrast. Cardiomegaly appears stable. No pericardial effusion. No aortic aneurysm or dissection. Aortic atherosclerosis.  Coronary artery calcifications. Mediastinum/Nodes: Scattered small lymph nodes within the mediastinum. No enlarged or morphologically abnormal lymph nodes identified within the mediastinum or perihilar regions. Lungs/Pleura: Patchy consolidations at each lung base, most likely atelectasis. Mild interstitial edema bilaterally, perihilar and upper lobe predominant. Mild emphysematous changes again noted, upper lobe predominant. Bilateral calcified pleural plaques again noted, presumably related to previous asbestos exposure. Upper Abdomen: Cirrhotic appearing liver. Cholelithiasis without evidence of acute cholecystitis, incompletely imaged. Small amount of free fluid/ascites within the upper abdomen. Musculoskeletal: No acute or suspicious osseous finding. Median sternotomy wires in place. IMPRESSION: 1. Compared to CT of 01/21/2013, LVAD appears stable in position and configuration. Surrounding soft tissues appear stable. 2. Cardiomegaly with perihilar and upper lobe edema indicating mild CHF/volume overload. 3. Patchy small consolidations at each lung base, likely atelectasis. 4. Small right pleural effusion and trace left pleural effusion. 5. Emphysematous changes, upper lobe predominant.  6. Aortic  atherosclerosis. 7. Cirrhotic liver. 8. Cholelithiasis. 9. Small amount of free fluid/ascites in the upper abdomen. Electronically Signed   By: Franki Cabot M.D.   On: 01/05/2017 13:26   Dg Chest Port 1 View  Result Date: 01/07/2017 CLINICAL DATA:  Intubated patient, ischemic cardiomyopathy, CHF, COPD, asbestosis. EXAM: PORTABLE CHEST 1 VIEW COMPARISON:  Portable chest x-ray of January 06, 2017 FINDINGS: The lungs are reasonably well inflated. The interstitial markings remain increased. The pulmonary vascularity remains engorged. The cardiac silhouette remains enlarged. The left ventricular assist device appears to be in stable position. External pacemaker defibrillator pads are present. The endotracheal tube tip lies approximately 4.5 cm above the carina. The esophagogastric tube tip projects below the inferior margin of the image. The right-sided PICC line tip projects over the midportion of the SVC. Pleural plaques are noted at both lung bases. IMPRESSION: CHF with pulmonary interstitial edema superimposed upon COPD and evidence of previous asbestos exposure. No pneumothorax or large pleural effusion. The support tubes and devices are in reasonable position. Electronically Signed   By: David  Martinique M.D.   On: 01/07/2017 07:27   Dg Chest Port 1 View  Result Date: 01/06/2017 CLINICAL DATA:  76 year old male with endotracheal tube in place. Subsequent encounter. EXAM: PORTABLE CHEST 1 VIEW COMPARISON:  01/05/2017. FINDINGS: Endotracheal tube tip 4.5 cm above the carina. Right central line tip mid superior vena cava level. Post CABG.  Cardiomegaly. Pulmonary vascular congestion with small pleural effusions. Basilar atelectasis. Findings without significant change. IMPRESSION: Similar appearance of pulmonary vascular congestion, small pleural effusions, basilar subsegmental atelectasis and cardiomegaly. Electronically Signed   By: Genia Del M.D.   On: 01/06/2017 07:24   Dg Chest Port 1 View  Result Date:  01/05/2017 CLINICAL DATA:  Endotracheal tube placement.  Initial encounter. EXAM: PORTABLE CHEST 1 VIEW COMPARISON:  Chest radiograph and CT of the chest performed earlier today at 12:56 p.m. FINDINGS: The patient's endotracheal tube is seen ending 2-3 cm above the carina. A right PICC is noted ending about the distal SVC. The lungs are well-aerated. Vascular congestion is noted. Increased interstitial markings raise concern for pulmonary edema, increased from the prior studies. Small bilateral pleural effusions are suspected. There is no evidence of pneumothorax. The cardiomediastinal silhouette is within normal limits. No acute osseous abnormalities are seen. An external pacing pad is noted. IMPRESSION: 1. Endotracheal tube seen ending 2-3 cm above the carina. 2. Vascular congestion. Increased interstitial markings raise concern for pulmonary edema, more prominent than on the prior studies. Small bilateral pleural effusions suspected. Electronically Signed   By: Garald Balding M.D.   On: 01/05/2017 21:50   Dg Chest Port 1 View  Result Date: 01/05/2017 CLINICAL DATA:  Confirm line placement. History of atrial fibrillation, CHF, heart failure, COPD, hypertension, myocardial infarction, defibrillator. EXAM: PORTABLE CHEST 1 VIEW COMPARISON:  Chest x-ray dated 01/03/2017. FINDINGS: Right-sided PICC line has been placed with tip appropriately positioned at the level of the lower SVC. Cardiomegaly appears stable. Left ventricular assist device in place. Stable central pulmonary vascular congestion and bilateral interstitial edema. Lungs are at least mildly hyperexpanded suggesting COPD. No pleural effusion or pneumothorax seen. IMPRESSION: 1. Right-sided PICC line in place with tip appropriately positioned at the level of the lower SVC. 2. Cardiomegaly with central pulmonary vascular congestion and bilateral interstitial edema suggesting CHF/volume overload, similar to yesterday's exam. Electronically Signed   By:  Franki Cabot M.D.   On: 01/05/2017 10:58   Dg Abd Portable 1v  Result Date: 01/07/2017 CLINICAL DATA:  Orogastric tube placement.  Initial encounter. EXAM: PORTABLE ABDOMEN - 1 VIEW COMPARISON:  Chest radiograph performed 01/06/2017 FINDINGS: The patient's enteric tube is noted ending overlying the antrum of the stomach. The stomach is largely filled with air. A left ventricular assist device is noted. The patient is status post median sternotomy. External pacing pads are noted. No acute osseous abnormalities are identified. IMPRESSION: Enteric tube noted ending overlying the antrum of the stomach. Stomach largely filled with air. Electronically Signed   By: Garald Balding M.D.   On: 01/07/2017 01:27    Scheduled: . arformoterol  15 mcg Nebulization BID  . budesonide (PULMICORT) nebulizer solution  0.25 mg Nebulization BID  . chlorhexidine gluconate (MEDLINE KIT)  15 mL Mouth Rinse BID  . Chlorhexidine Gluconate Cloth  6 each Topical Daily  . furosemide  160 mg Intravenous Q6H  . insulin aspart  1-3 Units Subcutaneous Q4H  . levothyroxine  50 mcg Intravenous QHS  . mouth rinse  15 mL Mouth Rinse 10 times per day  . pantoprazole (PROTONIX) IV  40 mg Intravenous Q12H  . sodium chloride flush  10-40 mL Intracatheter Q12H  . sodium chloride flush  3 mL Intravenous Q12H      LOS: 3 days   Henrine Hayter C 01/07/2017,8:21 AM

## 2017-01-07 NOTE — Progress Notes (Signed)
Admitted 01/18/2017 due to Cardiogenic shock and LVAD thrombus.   HeartMate II LVAD implated on 01/12/2013 by Dr Prescott Gum.   Intubated 01/05/17 TPA administered x 10 hrs 01/05/17 V-Tach 01/05/17- defib x1   Vital signs: HR: 138 Doppler Pressure:75 Automatic BP: 83/69 O2 Sat: 94 Wt: 102.3 kg     LVAD interrogation reveals:  Speed:9000 Flow: 6 Power:  6 PI:4.6  Alarms: Multiple LOW FLOW alarms 01/05/17. None overnight Events: no PI events overnight Fixed speed: 9000 Low speed limit: 8400  Drive Line: Unremarkable. Changed by wife, [name], weekly  Labs:  LDH trend:1370>1731>2508>3776>4954>2225  INR trend:  On Eliquis prior to admission. INR 4/10 2.35  Anticoagulation Plan: - Heparin drip -ASA Dose: none  Adverse Events on VAD: -GIB -Anemia  Plan/Recommendations:   1. Continue supportive care. [name] is willing to continue aggressive treatment for up to 10 days and if no improvement, would like to consider comfort measures. We spoke today at length about CRRT risks and benefits, IV gtts used for BP management, and VAD parameters.   Balinda Quails RN, VAD Coordinator 24/7 pager [phone]

## 2017-01-07 NOTE — Progress Notes (Addendum)
ANTICOAGULATION CONSULT NOTE - Follow Up Consult  Pharmacy Consult for heparin Indication: LVAD thrombosis  Labs:  Recent Labs  01/05/17 2145  01/06/17 0500 01/06/17 0830 01/06/17 0913  01/06/17 1815 01/06/17 2237 01/07/17 0219 01/07/17 0529 01/07/17 0530  HGB 6.9*  --  9.6*  --  10.3*  --   --   --   --  9.2*  --   HCT 24.7*  --  32.6*  --  33.6*  --   --   --   --  30.3*  --   PLT 160  --  160  --  155  --   --   --   --  123*  --   APTT >200*  --   --  161*  --   --  119*  --   --   --  108*  HEPARINUNFRC 1.42*  --   --  1.08*  --   --   --   --   --   --  1.18*  CREATININE  --   < > 3.97*  --   --   < > 5.08* 5.13* 5.33* 5.79*  --   < > = values in this interval not displayed.  Medications: Heparin @ 1000 units/hr  Assessment:  76yo male with LVAD admitted with probable pump thrombosis. APTT was 80 > therapeutic on bivalirudin with initial dosing while Eliquis on hold. Bivalirudin drip was moved from peripheral IV site to PICC line and aptt drawn from PICC line as well at 1430 so inaccurate (> 200). Bivalirudin gtt changed to heparin drip since TPA started at 3mg /hr to break up clot. TPA ran for approximately 8 hours  Hgb down to 9.2 this morning, s/p PRBC x 2 units 4/8. Nose bleed overnight resolved with packing. Lines are oozing.  APTT is 108, heparin level still high at 1.18. Would think his heparin level would be correlating soon, but with no uop apixaban may not be clearing as well.  Goal: Heparin level 0.3-0.7 units/hr (PTT 66-102)  Monitor platelets per [facility] protocol  Plan: 1) Decrease heparin to 900 units/hr 2) Follow up daily heparin level and aPTT  Erin Hearing PharmD., BCPS Clinical Pharmacist Pager [phone] 01/07/2017 7:36 AM  Addendum:  Discussed with MD/HF team, concern for sepsis with multiple lines, low grade fever, elevated wbc. New orders to start empiric vancomycin and zosyn. Given renal failure will adjust zosyn to q12 hour dosing and give one  time loading dose of vancomycin and follow up plan in 48 hours for redosing or random level.  01/07/2017 1:53 PM

## 2017-01-07 NOTE — Progress Notes (Signed)
Dr Aundra Dubin and Dr Haroldine Laws made aware of increased need for BP support  per [facility] RN. Orders obtained for venous blood gas to assess PH and to start [place] per pharmacy. Personally called pharmacist of service to initiate. Plan to touch base with [name] this afternoon regarding increased requirements of EPI and LEVO gtts.   Balinda Quails RN, VAD Coordinator 24/7 pager [phone]

## 2017-01-07 NOTE — Care Management Note (Addendum)
Case Management Note  Patient Details  Name: DUY LEMMING MRN: [id number] Date of Birth: 1941/07/18  Subjective/Objective:                 Patient admitted from home with wife. Admitted with acute respiratory failure, hx of COPD, intubated. Cardiogenic shock. LVAD thrombosis s/p thrombolytic. Acute on chronic systolic CHF. Acute renal failure, unable to tolerate CRRT at this time.    Action/Plan:  CM will continue to follow. Patient passed away 01/11/2017.    Expected Discharge Date:                  Expected Discharge Plan:     In-House Referral:     Discharge planning Services  CM Consult  Post Acute Care Choice:    Choice offered to:     DME Arranged:    DME Agency:     HH Arranged:    HH Agency:     Status of Service:  In process, will continue to follow  If discussed at Long Length of Stay Meetings, dates discussed:    Additional Comments:  Carles Collet, RN 01/07/2017, 3:26 PM

## 2017-01-07 NOTE — Progress Notes (Signed)
ANTICOAGULATION CONSULT NOTE - Follow Up Consult  Pharmacy Consult for heparin Indication: LVAD thrombosis  Labs:  Recent Labs  01/06/17 0500 01/06/17 0830 01/06/17 0913  01/06/17 1815 01/06/17 2237 01/07/17 0219 01/07/17 0515 01/07/17 0529 01/07/17 0530 01/07/17 1613  HGB 9.6*  --  10.3*  --   --   --   --   --  9.2*  --   --   HCT 32.6*  --  33.6*  --   --   --   --   --  30.3*  --   --   PLT 160  --  155  --   --   --   --   --  123*  --   --   APTT  --  161*  --   --  119*  --   --   --   --  108* 75*  LABPROT  --   --   --   --   --   --   --  26.1*  --   --   --   INR  --   --   --   --   --   --   --  2.35  --   --   --   HEPARINUNFRC  --  1.08*  --   --   --   --   --   --   --  1.18* 0.95*  CREATININE 3.97*  --   --   < > 5.08* 5.13* 5.33*  --  5.79*  --   --   < > = values in this interval not displayed.  Medications: Heparin @ 900 units/hr  Assessment:  76yo male with LVAD admitted with probable pump thrombosis. APTT was 80 > therapeutic on bivalirudin with initial dosing while Eliquis on hold. Bivalirudin drip was moved from peripheral IV site to PICC line and aptt drawn from PICC line as well at 1430 so inaccurate (> 200). Bivalirudin gtt changed to heparin drip since TPA started at 3mg /hr to break up clot. TPA ran for approximately 8 hours  Hgb down to 9.2 this morning, s/p PRBC x 2 units 4/8. Nose bleed overnight resolved with packing. Lines oozing overnight, but the gauze have not heeded to be changed today. RN is now reporting slight mucosal bleeding, provider aware and in room while RN was suctioning. Asked RN to call if bleeding gets worse.  aPTT is 75 and heparin level is not correlating at 0.95. Would think his heparin level would be correlating soon, but with no uop apixaban may not be clearing as well.  Goal: Heparin level 0.3-0.7 units/hr (PTT 66-102)  Monitor platelets per [facility] protocol  Plan: 1) Continue heparin to 900 units/hr 2) Follow up daily  heparin level and aPTT 3) Monitor bleeding  Georga Bora, PharmD Clinical Pharmacist Pager: [phone] 01/07/2017 5:48 PM

## 2017-01-07 NOTE — Plan of Care (Signed)
Patient less responsive this afternoon but moves all extremities, does not follow commands. Rare eye opening. Nailbeds somewhat cyanotic due to pressor increase. Will continue to assess.

## 2017-01-08 ENCOUNTER — Inpatient Hospital Stay (HOSPITAL_COMMUNITY): Payer: Non-veteran care

## 2017-01-08 LAB — TYPE AND SCREEN
ABO/RH(D): O POS
ANTIBODY SCREEN: NEGATIVE
UNIT DIVISION: 0
Unit division: 0
Unit division: 0

## 2017-01-08 LAB — BPAM RBC
BLOOD PRODUCT EXPIRATION DATE: 201805052359
Blood Product Expiration Date: 201805042359
Blood Product Expiration Date: 201805092359
ISSUE DATE / TIME: 201804090026
ISSUE DATE / TIME: 201804090239
UNIT TYPE AND RH: 5100
UNIT TYPE AND RH: 5100
Unit Type and Rh: 5100

## 2017-01-08 LAB — CBC
HEMATOCRIT: 28 % — AB (ref 39.0–52.0)
HEMOGLOBIN: 8.4 g/dL — AB (ref 13.0–17.0)
MCH: 29.3 pg (ref 26.0–34.0)
MCHC: 30 g/dL (ref 30.0–36.0)
MCV: 97.6 fL (ref 78.0–100.0)
Platelets: 124 10*3/uL — ABNORMAL LOW (ref 150–400)
RBC: 2.87 MIL/uL — AB (ref 4.22–5.81)
RDW: 21.1 % — ABNORMAL HIGH (ref 11.5–15.5)
WBC: 13.1 10*3/uL — ABNORMAL HIGH (ref 4.0–10.5)

## 2017-01-08 LAB — HEPATIC FUNCTION PANEL
ALBUMIN: 2.6 g/dL — AB (ref 3.5–5.0)
ALK PHOS: 68 U/L (ref 38–126)
ALT: 783 U/L — ABNORMAL HIGH (ref 17–63)
AST: 789 U/L — AB (ref 15–41)
BILIRUBIN TOTAL: 5.1 mg/dL — AB (ref 0.3–1.2)
Bilirubin, Direct: 3 mg/dL — ABNORMAL HIGH (ref 0.1–0.5)
Indirect Bilirubin: 2.1 mg/dL — ABNORMAL HIGH (ref 0.3–0.9)
TOTAL PROTEIN: 5.5 g/dL — AB (ref 6.5–8.1)

## 2017-01-08 LAB — BASIC METABOLIC PANEL
ANION GAP: 18 — AB (ref 5–15)
BUN: 68 mg/dL — ABNORMAL HIGH (ref 6–20)
CHLORIDE: 86 mmol/L — AB (ref 101–111)
CO2: 27 mmol/L (ref 22–32)
Calcium: 7.8 mg/dL — ABNORMAL LOW (ref 8.9–10.3)
Creatinine, Ser: 7.1 mg/dL — ABNORMAL HIGH (ref 0.61–1.24)
GFR calc non Af Amer: 7 mL/min — ABNORMAL LOW (ref >60)
GFR, EST AFRICAN AMERICAN: 8 mL/min — AB (ref >60)
Glucose, Bld: 196 mg/dL — ABNORMAL HIGH (ref 65–99)
POTASSIUM: 4.6 mmol/L (ref 3.5–5.1)
Sodium: 131 mmol/L — ABNORMAL LOW (ref 135–145)

## 2017-01-08 LAB — GLUCOSE, CAPILLARY: GLUCOSE-CAPILLARY: 195 mg/dL — AB (ref 65–99)

## 2017-01-08 LAB — HEPARIN LEVEL (UNFRACTIONATED): Heparin Unfractionated: 0.72 IU/mL — ABNORMAL HIGH (ref 0.30–0.70)

## 2017-01-08 LAB — COOXEMETRY PANEL
Carboxyhemoglobin: 2.3 % — ABNORMAL HIGH (ref 0.5–1.5)
Methemoglobin: 1.6 % — ABNORMAL HIGH (ref 0.0–1.5)
O2 SAT: 82.4 %
Total hemoglobin: 8 g/dL — ABNORMAL LOW (ref 12.0–16.0)

## 2017-01-08 LAB — APTT: aPTT: 60 seconds — ABNORMAL HIGH (ref 24–36)

## 2017-01-08 LAB — LACTATE DEHYDROGENASE: LDH: 1472 U/L — ABNORMAL HIGH (ref 98–192)

## 2017-01-09 ENCOUNTER — Other Ambulatory Visit: Payer: Non-veteran care

## 2017-01-09 ENCOUNTER — Encounter (HOSPITAL_COMMUNITY): Payer: Self-pay

## 2017-01-09 ENCOUNTER — Other Ambulatory Visit: Payer: Medicare Other

## 2017-01-09 NOTE — Progress Notes (Signed)
Death certificate received to CHF clinic. Placed in death certificate folder to be completed by physician.  Renee Pain

## 2017-01-10 ENCOUNTER — Encounter (HOSPITAL_COMMUNITY): Payer: Self-pay

## 2017-01-10 ENCOUNTER — Other Ambulatory Visit: Payer: Self-pay | Admitting: Nurse Practitioner

## 2017-01-10 NOTE — Progress Notes (Signed)
Certificate of death completed by Dr. Aundra Dubin. [employer] made aware to retrieve. Original  Certificate placed in death certificate folder in CHF clinic. Copy scanned into patient's electronic medical record.  Renee Pain, RN

## 2017-01-15 MED FILL — Medication: Qty: 1 | Status: AC

## 2017-01-16 ENCOUNTER — Other Ambulatory Visit: Payer: Non-veteran care

## 2017-01-16 ENCOUNTER — Ambulatory Visit: Payer: Non-veteran care

## 2017-01-28 NOTE — Procedures (Signed)
Extubation Procedure Note  Patient Details:   Name: [name] DOB: 02-Jul-1941 MRN: [id number]   Airway Documentation:     Evaluation  O2 sats: currently acceptable Complications: No apparent complications Patient did tolerate procedure well. Bilateral Breath Sounds: Clear, Diminished   No  Patient terminally extubated per MDs verbal order. MD, RN, and family at bedside. Patient comfortable at this time.   Myrtie Neither 01-11-17, 9:53 AM

## 2017-01-28 NOTE — Progress Notes (Addendum)
Wasted 17mL of Versed in trash. Witnessed by Tommy Rainwater RN

## 2017-01-28 NOTE — Progress Notes (Signed)
LVAD Coordinator Rounding note:  Dunn Loring with withdrawal of care per Faye's wishes. Dr Aundra Dubin and Dr Haroldine Laws aware.  LVAD pump turned off and drive line disconnected from controller. Both Tanda Rockers, VAD Coordinator and myself stayed with patient and family during withdrawal.   Balinda Quails RN, [place] Coordinator 24/7 pager [phone]

## 2017-01-28 NOTE — Progress Notes (Signed)
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   On 4/8 developed worsening shock refractory to inotropes. Received IV TPA with normalization of pump flow. T-PA stopped at midnight. Had one bloody BM but otherwise no apparent bleeding. Given 2u RBC overnight  Developed fever yesterday.  Tmax 101.3. MAPs 60-70s  Intubated and now unresponsive. Grimaces to painful stimuli. Oliguric. Had ~ 200 cc of urine output yesterday. Now on epi up to 20 mcg/min, norepi up to 80 mcg/min, and vasopressin at 0.03. MAP 60-70s Coox 82.4%.  Lactate 2.8 01/07/17.  Wife tearful this morning. Pt not candidate for dialysis and has worsened in past 24 hours.  She states he would not like to live this. Wishes to transition to comfort care.   K 4.6. Creatinine up to 7.10 from 5.79 yesterday am. BUN 68.  LDH down to 1472 from 2225.  LVAD INTERROGATION:  HeartMate II LVAD:  Flow 6.2 liters/min, speed 9000, power 6,  PI 3.8  Objective:    Vital Signs:   Temp:  [99.6 F (37.6 C)-101.3 F (38.5 C)] 100 F (37.8 C) (04/11 0600) Pulse Rate:  [47-139] 133 (04/11 0700) Resp:  [18-26] 20 (04/11 0700) BP: (60-98)/(37-87) 79/68 (04/11 0700) SpO2:  [91 %-97 %] 96 % (04/11 0700) FiO2 (%):  [40 %-60 %] 60 % (04/11 0600) Weight:  [223 lb 8.7 oz (101.4 kg)] 223 lb 8.7 oz (101.4 kg) (04/11 0600) Last BM Date: 01/20/2017   Mean arterial Pressure 60-70s on maxed pressors  Intake/Output:   Intake/Output Summary (Last 24 hours) at 03-Feb-2017 0727 Last data filed at 02-03-2017 0700  Gross per 24 hour  Intake          4756.63 ml  Output              330 ml  Net          4426.63 ml     Physical Exam: General:  On vent. Unresponsive. Sedated on fentanyl and versed.   HEENT: + ETT, + Scleral icterus.    Neck: Supple. JVP elevated to ear. Carotids 2+. No bruits. No thyromegaly.   Cor: Mechanical heart sounds with LVAD hum present. LVAD sounds OK. No audible pump groan.     Lungs: Diminished with mild crackles throughout.  Abdomen: Soft but distended. No bruits or masses. Hypoactive bowel sounds.  Driveline: C/D/I; securement device intact and driveline incorporated. Site clear.  Extremities: Cyanotic toes and finger tips. No clubbing or rash.  Cool to the touch. 1+ ankle edema. 1 + UE edemas.   rash,   Neuro: Intubated. Responds only to painful stimuli.   Telemetry: Reviewed personally, Afib 130s   Labs: Basic Metabolic Panel:  Recent Labs Lab 01/05/17 1036  01/06/17 0427 01/06/17 0500  01/06/17 1300 01/06/17 1815 01/06/17 2237 01/07/17 0219 01/07/17 0529 Feb 03, 2017 0409  NA 139  < >  --  137  < > 141 140 132* 131* 136 131*  K 5.4*  < >  --  4.2  < > 4.3 4.4 4.1 4.1 4.3 4.6  CL 109  < >  --  95*  < > 98* 97* 91* 89* 91* 86*  CO2 21*  < >  --  23  < > '30 30 28 28 30 27  '$ GLUCOSE 151*  < >  --  418*  < > 174* 192* 379* 385* 241* 196*  BUN 34*  < >  --  41*  < >  49* 51* 50* 51* 55* 68*  CREATININE 2.38*  < >  --  3.97*  < > 4.68* 5.08* 5.13* 5.33* 5.79* 7.10*  CALCIUM 9.0  < >  --  8.7*  < > 8.8* 8.8* 8.0* 7.9* 8.1* 7.8*  MG 2.0  --  1.5* 1.5*  --  2.5*  --   --   --  2.4  --   < > = values in this interval not displayed.  Liver Function Tests:  Recent Labs Lab 01/14/2017 1913 01/06/17 0500 01/07/17 0529 2017-01-17 0409  AST 128* 2,471* 1,353* 789*  ALT 20 804* 800* 783*  ALKPHOS 50 54 63 68  BILITOT 3.0* 7.1* 5.2* 5.1*  PROT 6.7 5.4* 5.3* 5.5*  ALBUMIN 3.4* 2.8* 2.7* 2.6*   No results for input(s): LIPASE, AMYLASE in the last 168 hours. No results for input(s): AMMONIA in the last 168 hours.  CBC:  Recent Labs Lab 01/27/2017 1913  01/05/17 2145 01/06/17 0500 01/06/17 0913 01/07/17 0529 01/17/17 0300  WBC 10.5  < > 23.0* 22.9* 22.4* 15.1* 13.1*  NEUTROABS 9.1*  --   --   --   --   --   --   HGB 9.8*  < > 6.9* 9.6* 10.3* 9.2* 8.4*  HCT 34.1*  < > 24.7* 32.6* 33.6* 30.3* 28.0*  MCV 100.3*  < > 107.9* 98.8 97.4 95.3 97.6  PLT 154  <  > 160 160 155 123* 124*  < > = values in this interval not displayed.  INR:  Recent Labs Lab 01/07/17 0515  INR 2.35    Other results:    Imaging: Dg Chest Port 1 View  Result Date: 01/07/2017 CLINICAL DATA:  Intubated patient, ischemic cardiomyopathy, CHF, COPD, asbestosis. EXAM: PORTABLE CHEST 1 VIEW COMPARISON:  Portable chest x-ray of January 06, 2017 FINDINGS: The lungs are reasonably well inflated. The interstitial markings remain increased. The pulmonary vascularity remains engorged. The cardiac silhouette remains enlarged. The left ventricular assist device appears to be in stable position. External pacemaker defibrillator pads are present. The endotracheal tube tip lies approximately 4.5 cm above the carina. The esophagogastric tube tip projects below the inferior margin of the image. The right-sided PICC line tip projects over the midportion of the SVC. Pleural plaques are noted at both lung bases. IMPRESSION: CHF with pulmonary interstitial edema superimposed upon COPD and evidence of previous asbestos exposure. No pneumothorax or large pleural effusion. The support tubes and devices are in reasonable position. Electronically Signed   By: David  Martinique M.D.   On: 01/07/2017 07:27   Dg Abd Portable 1v  Result Date: 01/07/2017 CLINICAL DATA:  Orogastric tube placement.  Initial encounter. EXAM: PORTABLE ABDOMEN - 1 VIEW COMPARISON:  Chest radiograph performed 01/06/2017 FINDINGS: The patient's enteric tube is noted ending overlying the antrum of the stomach. The stomach is largely filled with air. A left ventricular assist device is noted. The patient is status post median sternotomy. External pacing pads are noted. No acute osseous abnormalities are identified. IMPRESSION: Enteric tube noted ending overlying the antrum of the stomach. Stomach largely filled with air. Electronically Signed   By: Garald Balding M.D.   On: 01/07/2017 01:27     Medications:     Scheduled  Medications: . arformoterol  15 mcg Nebulization BID  . budesonide (PULMICORT) nebulizer solution  0.25 mg Nebulization BID  . chlorhexidine gluconate (MEDLINE KIT)  15 mL Mouth Rinse BID  . Chlorhexidine Gluconate Cloth  6 each Topical Daily  .  insulin aspart  1-3 Units Subcutaneous Q4H  . levothyroxine  50 mcg Intravenous QHS  . mouth rinse  15 mL Mouth Rinse 10 times per day  . pantoprazole (PROTONIX) IV  40 mg Intravenous Q12H  . piperacillin-tazobactam (ZOSYN)  IV  3.375 g Intravenous Q12H  . sodium chloride flush  10-40 mL Intracatheter Q12H  . sodium chloride flush  3 mL Intravenous Q12H    Infusions: . amiodarone 60 mg/hr (2017/01/21 0700)  . epinephrine 20 mcg/min (01-21-17 0700)  . fentaNYL infusion INTRAVENOUS 50 mcg/hr (01-21-2017 0700)  . heparin 950 Units/hr (Jan 21, 2017 0700)  . midazolam (VERSED) infusion 1 mg/hr (Jan 21, 2017 0700)  . norepinephrine (LEVOPHED) Adult infusion 80 mcg/min (21-Jan-2017 0700)  . vasopressin (PITRESSIN) infusion - *FOR SHOCK* 0.03 Units/min (January 21, 2017 0700)    PRN Medications: sodium chloride, acetaminophen (TYLENOL) oral liquid 160 mg/5 mL **OR** acetaminophen, albuterol, fentaNYL, midazolam, ondansetron (ZOFRAN) IV, sodium chloride flush, sodium chloride flush   Assessment:   1. 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  Despite this, he has not been able to maintain stable MAPs to the point of CVVHD consideration.     - s/p T-pa treatment 4/9 with normalization of pump parameters   - Remains on epi, norepi, and vasopressin. Coox 76%. Would wean epi off first as tolerated, but MAPs remain very soft currently.  2. LVAD pump thrombosis   -s/p t-pa infusion 4/8. Now on heparin      - Pump parameters have stabilized. LDH trending down, but unfortunately end-organ damage and sequelae have made no improvement.  Pt has continued to worsen over the past 24 hrs and is now  minimally responsive.   3. Acute on chronic systolic HF     --HMII implanted 01/12/13     --Echo 4/8 LVEF 30-35% RV moderately decreased     - Remains markedly volume overloaded with pulmonary interstitial edema on CXR 01/07/17. Reviewed personally.      - He has not responded to high dose IV lasix and is not stable enough for CVVHD. Unfortunately he has no options left for recovery, and decision has been made to transition to comfort care.   4. Chronic AF with RVR     - Rate somewhat elevated on amio gtt. No good response to re-bolus.      - No role for BB in acute shock.       - Do not think he is stable enough at this time to consider DCCV.   Continue to follow.  5. Acute renal failure     - Despite max pressor support as above pt has not perfused his kidneys or become stable enough to undergo dialysis.  He has been anuric/oliguric for the past several days and is not improving.  Plan to transition to comfort care.      - Initially Hemodynamically mediated due to shock/ATN 6. Shock liver      - LFTs trending down slightly.   7. Hypokalemia/Hypomag      - Electrolytes stable this am.   8. VT     -s/p defib x 1 4/9. No further. Remains on amio gtt.  Will stop amio later this morning when transitioning to comfort care.  9. Acute Gi bleed in setting of t-PA     -s/p 2u RBCs on 4/9. Hgb 8.4 this am.  10. CAD s/p CABG 2003     - No change.  11. ID - Now with fevers over night.  Started empirically on Vanc/Zosyn. Now planning to transition to comfort care.   Have had long and ongoing discussions concerning patients prognosis.  Dr. Florene Glen has seen patient already this morning and he has not become stable enough for CRRT.  Their recommendation is to proceed with comfort care.   Wife understands and agrees.  She states we did all we could but she "cannot stand" to see him like this, and knows he wouldn't want to suffer like this.   Discussed with VAD team including MDs.  They will come by  shortly.  Will likely plan on transition to full comfort care later this morning or early afternoon once proper comfort measures are in place to consider pump stop.    I reviewed the LVAD parameters from today, and compared the results to the patient's prior recorded data.  No programming changes were made.  The LVAD is functioning within specified parameters at this time. The nurse has been performing LVAD self-test daily.  LVAD interrogation was negative for any significant power changes, alarms or PI events/speed drops. Since 01/05/2017.  LVAD equipment check completed and is in good working order.  Back-up equipment present.    Length of Stay: 4  Annamaria Helling  January 09, 2017, 7:27 AM  VAD Team --- VAD ISSUES ONLY--- Pager [phone] (7am - 7am)  Advanced Heart Failure Team  Pager [phone] (M-F; 7a - 4p)  Please contact [facility] for night-coverage after hours (4p -7a ) and weekends on [email or web address]  Patient seen with PA, agree with the above note.  Patient worsened over the last 24 hours, now he is responsive only to pain.  He is on maximal doses of 3 pressors with MAP still running low.  He remains oliguric with worsening creatinine.  At this point, he is not a candidate for CVVH.  He has developed a fever and is on empiric abx.  LDH continues to fall and pump functioning appropriately, but end organ damage accumulated during period of pump thrombosis at this point appears unreversible.    I have discussed situation with his wife this morning.  We will plan for comfort measures once family has assembled.  Will ease him off support and stop pump later today.   Loralie Champagne Jan 09, 2017 8:11 AM

## 2017-01-28 NOTE — Progress Notes (Signed)
   02-07-17 0910  Clinical Encounter Type  Visited With Patient and family together  Visit Type Death  Spiritual Encounters  Spiritual Needs Prayer;Emotional;Grief support  Stress Factors  Patient Stress Factors Major life changes  Family Stress Factors Family relationships;Health changes  Paged by nurse of family needing chaplain. Introduction to family as Pt to be extubated. Offered prayer of comfort and peace. Pt. Died and gave prayer for the departed.

## 2017-01-28 NOTE — Progress Notes (Signed)
ANTICOAGULATION CONSULT NOTE - Follow Up Consult  Pharmacy Consult for Heparin  Indication: LVAD pump thrombosis  Allergies  Allergen Reactions  . Tape Other (See Comments)    Tape will tear with skin   Patient Measurements: Height: 6\' 1"  (185.4 cm) Weight: 225 lb 8.5 oz (102.3 kg) IBW/kg (Calculated) : 79.9  Vital Signs: Temp: 101 F (38.3 C) (04/10 2351) Temp Source: Oral (04/10 2351) BP: 73/58 (04/11 0351) Pulse Rate: 137 (04/11 0351)  Labs:  Recent Labs  01/06/17 0913  01/06/17 2237 01/07/17 0219 01/07/17 0515 01/07/17 0529 01/07/17 0530 01/07/17 1613 01/29/2017 0300  HGB 10.3*  --   --   --   --  9.2*  --   --  8.4*  HCT 33.6*  --   --   --   --  30.3*  --   --  28.0*  PLT 155  --   --   --   --  123*  --   --  124*  APTT  --   < >  --   --   --   --  108* 75* 60*  LABPROT  --   --   --   --  26.1*  --   --   --   --   INR  --   --   --   --  2.35  --   --   --   --   HEPARINUNFRC  --   --   --   --   --   --  1.18* 0.95* 0.72*  CREATININE  --   < > 5.13* 5.33*  --  5.79*  --   --   --   < > = values in this interval not displayed.  Estimated Creatinine Clearance: 13.9 mL/min (A) (by C-G formula based on SCr of 5.79 mg/dL (H)).  Assessment: 76 y/o M on heparin for probable LVAD pump thrombosis. APTT is low at 60. Currently using aPTT to dose given recent Apixaban use and its influence on anti-Xa levels. Some mild oozing from PICC, but nothing else per RN.   Goal of Therapy:  Heparin level 0.3-0.7 units/ml aPTT 66-102 seconds Monitor platelets by anticoagulation protocol: Yes   Plan:  -Inc heparin slightly to 950 units/hr -1200 aPTT -Trend Hgb  Narda Bonds 2017-01-29,3:52 AM

## 2017-01-28 NOTE — Death Summary Note (Signed)
Advanced Heart Failure/VAD Death Summary  Death Summary   Patient ID: [name] MRN: [id number], DOB/AGE: [birth year] 76 y.o. Admit date: 01/13/2017 D/C date:     01/09/17   Primary Discharge Diagnoses:  1. 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 Acute on chronic systolic CHF s/p HM 2 implant 01/12/13 4. Chronic AF with RVR 5. Acute renal failure 6. Shock Liver/Transaminitis 7. Hypokalemia 8. Hypomagnesemia 9. Ventricular Tachycardia s/p Defib 01/06/17 10. Acute GI bleed in setting of t-PA administration 11. CAD s/p CABG 2003 12. Fever 13. Afib with RVR  Hospital Course:   [name] was a 76 y.o. male [place] veteran with history of CAD s/p CABG 2003. Chronic systolic CHF s/p ICD, emphysema, OSA (on CPAP), and PAF. Pt s/p LVAD HM2 implantation 01/12/2013 under destination therapy criteria.  Other notable history includes multiple GI bleeds, for which he was switched from coumadin to Eliquis with improvement, and ICD extraction due to pocket infection in 2016.  On 01/23/2017 pt presented to [facility] via EMS after developing sudden onset SOB. On intial evaluation noted to be pale and concern raised for recurrent anemia. Hgb 9.8 and CXR with mild pulmonary edema but no infiltrate. Had no fevers, chills, productive cough, or CB. NYHA Class IIIB symptoms with worsening SOB with any exertion or movement.  Had not missed any doses of eliquis.    Low flow alarms noted on LVAD interrogation. Pumps sounds were OK on auscultation. Pt placed on IV lasix with pulmonary edema as above.   Pt had very sluggish response to IV lasix. PICC line placed for mixed venous saturation. At this time, LDH resulted at 1300 which was large jump from 285 2 months prior.  LDH continued to rise. Pt started on bivalirudin for suspected pump thrombosis, and norepi and milrinone for cardiogenic shock.  CT without outflow tract obstruction.    Pt had sustained VT with clinical deterioration  on 01/05/17. Given 300 mg amio bolus with resolution of VT. LDH noted to be doubling nearly ever 6 hours with anuria and worsening lactic acidosis.  Discussed case with in house surgeon, Dr. Cyndia Wood, and [facility] transplant team and pt thought to be too unstable for transfer or emergent pump exchange.    Decision made to perform low dose t-pa following protocol outlined in the [place] ([facility] 2016: 49-54). Pt and family made aware of high risk of GI bleeding, ICH, and death but understand no alternative available and death would be certain without. All parties agreed to proceed.  Pts LVAD pump parameters began to improve several hours after tPA administration, however he had developed progressive MSOF including anuric ARF and hypoxic respiratory failure. After discussion with family, planned to continue aggressive treatment for 48-72 hours including intubation and possible CVVHD.  Morning of 01/06/17 pt remained anuric, but was responsive on vent when sedation weaned. MAPS remained low in 60-70s requiring continued pressor support. Nephrology saw and thought to unstable to initiate CVVHD, in addition to needing to wait > 24 hrs after t-PA administration before any sticks or procedures.   LDH trended down slowly after t-PA administration.   Hospital course additionally complicated by bloody bowel movement with Hgb of 6.9 requiring transfusion, and Afib with RVR into 130s requiring Amio gtt and multiple boluses with minimal improvement.   Through 4/10 into 01-09-2017 pt required increased pressor support up to max doses of Epinephrine, Levophed, and Vasopressin.  Creatinine continued to worsen and  remained oliguric with minimal amounts of dark brown to black urine. Situation discussed with Nephrology, HF MD, and pt family. With lack of progression and no further option for treatment with multiple organ failure decision was made to transition to comfort care with pump stop once family arrived.   Pts VAD  turned off at 0925 2017/01/11 after terminal extubation at request of family.  Comfort meds increased as needed, and pt passed away very shortly afterwards with multiple family members, nurses, and HF providers at bedside.   Ultimately, 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  Every effort was made during this and previous encounters to improve patients clinical picture.   Duration of Discharge Encounter: Greater than 35 minutes   Signed, Ryan Friar, PA-C 01/09/2017, 1:25 PM

## 2017-01-28 NOTE — Progress Notes (Addendum)
Patient's VAD turned off at 0925 and Ventilator removed per request of family.  Family at bedside as well as Chaplain, Dr. Aundra Dubin, [place] Coordinators and RN.  Patient pronounced at 0936 per Balinda Quails, RN and Beckie Salts, RN [facility] notified of time of death. Referral number [id number].

## 2017-01-28 NOTE — Progress Notes (Signed)
Assessment 34M with anuric AKI in setting of cardiogenic shock on 3 pressors and LVAD thrombosis  1. Anuric AKI, suspected ATN from shock 2. Cardiogenic Shock from LVAD malfunction 3. Chronic CHF with LVAD 4. ASCVD 5. COPD with VDRF  Plan I think wife accepting of futility of situation and wants to stop everything today.  Subjective: Interval History: Unresponsive now   Objective: Vital signs in last 24 hours: Temp:  [99.6 F (37.6 C)-101.3 F (38.5 C)] 100 F (37.8 C) (04/11 0600) Pulse Rate:  [47-139] 133 (04/11 0700) Resp:  [18-26] 20 (04/11 0700) BP: (60-98)/(37-87) 79/68 (04/11 0700) SpO2:  [91 %-97 %] 96 % (04/11 0700) FiO2 (%):  [40 %-60 %] 60 % (04/11 0600) Weight:  [101.4 kg (223 lb 8.7 oz)] 101.4 kg (223 lb 8.7 oz) (04/11 0600) Weight change: 2.1 kg (4 lb 10.1 oz)  Intake/Output from previous day: 04/10 0701 - 04/11 0700 In: 4756.6 [I.V.:4406.6; IV Piggyback:350] Out: 330 [Urine:230; Emesis/NG output:100] Intake/Output this shift: No intake/output data recorded.  GI: distended and soft Extremities: edema 1-2+ Neurologic: Mental status: unresponsive  Lab Results:  Recent Labs  01/07/17 0529 Jan 30, 2017 0300  WBC 15.1* 13.1*  HGB 9.2* 8.4*  HCT 30.3* 28.0*  PLT 123* 124*   BMET:  Recent Labs  01/07/17 0529 Jan 30, 2017 0409  NA 136 131*  K 4.3 4.6  CL 91* 86*  CO2 30 27  GLUCOSE 241* 196*  BUN 55* 68*  CREATININE 5.79* 7.10*  CALCIUM 8.1* 7.8*   No results for input(s): PTH in the last 72 hours. Iron Studies: No results for input(s): IRON, TIBC, TRANSFERRIN, FERRITIN in the last 72 hours. Studies/Results: Dg Chest Port 1 View  Result Date: 01/07/2017 CLINICAL DATA:  Intubated patient, ischemic cardiomyopathy, CHF, COPD, asbestosis. EXAM: PORTABLE CHEST 1 VIEW COMPARISON:  Portable chest x-ray of January 06, 2017 FINDINGS: The lungs are reasonably well inflated. The interstitial markings remain increased. The pulmonary vascularity remains engorged.  The cardiac silhouette remains enlarged. The left ventricular assist device appears to be in stable position. External pacemaker defibrillator pads are present. The endotracheal tube tip lies approximately 4.5 cm above the carina. The esophagogastric tube tip projects below the inferior margin of the image. The right-sided PICC line tip projects over the midportion of the SVC. Pleural plaques are noted at both lung bases. IMPRESSION: CHF with pulmonary interstitial edema superimposed upon COPD and evidence of previous asbestos exposure. No pneumothorax or large pleural effusion. The support tubes and devices are in reasonable position. Electronically Signed   By: David  [place] M.D.   On: 01/07/2017 07:27   Dg Abd Portable 1v  Result Date: 01/07/2017 CLINICAL DATA:  Orogastric tube placement.  Initial encounter. EXAM: PORTABLE ABDOMEN - 1 VIEW COMPARISON:  Chest radiograph performed 01/06/2017 FINDINGS: The patient's enteric tube is noted ending overlying the antrum of the stomach. The stomach is largely filled with air. A left ventricular assist device is noted. The patient is status post median sternotomy. External pacing pads are noted. No acute osseous abnormalities are identified. IMPRESSION: Enteric tube noted ending overlying the antrum of the stomach. Stomach largely filled with air. Electronically Signed   By: Roanna Raider M.D.   On: 01/07/2017 01:27    Scheduled: . arformoterol  15 mcg Nebulization BID  . budesonide (PULMICORT) nebulizer solution  0.25 mg Nebulization BID  . chlorhexidine gluconate (MEDLINE KIT)  15 mL Mouth Rinse BID  . Chlorhexidine Gluconate Cloth  6 each Topical Daily  . insulin aspart  1-3 Units Subcutaneous Q4H  . levothyroxine  50 mcg Intravenous QHS  . mouth rinse  15 mL Mouth Rinse 10 times per day  . pantoprazole (PROTONIX) IV  40 mg Intravenous Q12H  . piperacillin-tazobactam (ZOSYN)  IV  3.375 g Intravenous Q12H  . sodium chloride flush  10-40 mL Intracatheter  Q12H  . sodium chloride flush  3 mL Intravenous Q12H     LOS: 4 days   Tinika Bucknam C 01-30-17,7:37 AM

## 2017-01-28 DEATH — deceased

## 2017-01-30 ENCOUNTER — Other Ambulatory Visit: Payer: Non-veteran care

## 2017-02-06 ENCOUNTER — Other Ambulatory Visit: Payer: Non-veteran care

## 2017-02-13 ENCOUNTER — Ambulatory Visit: Payer: Non-veteran care | Admitting: Hematology

## 2017-02-13 ENCOUNTER — Other Ambulatory Visit: Payer: Non-veteran care

## 2017-02-13 ENCOUNTER — Ambulatory Visit: Payer: Non-veteran care

## 2017-02-18 ENCOUNTER — Encounter (HOSPITAL_COMMUNITY): Payer: Medicare Other

## 2017-09-15 IMAGING — DX DG CHEST 2V
2 series · 2 of 2 positions shown · non-contrast
Comparison: 12/08/2015.  08/22/2015 .  12/20/2014 .

CLINICAL DATA: Chest pain.

EXAM:
CHEST  2 VIEW

[w chest pa]
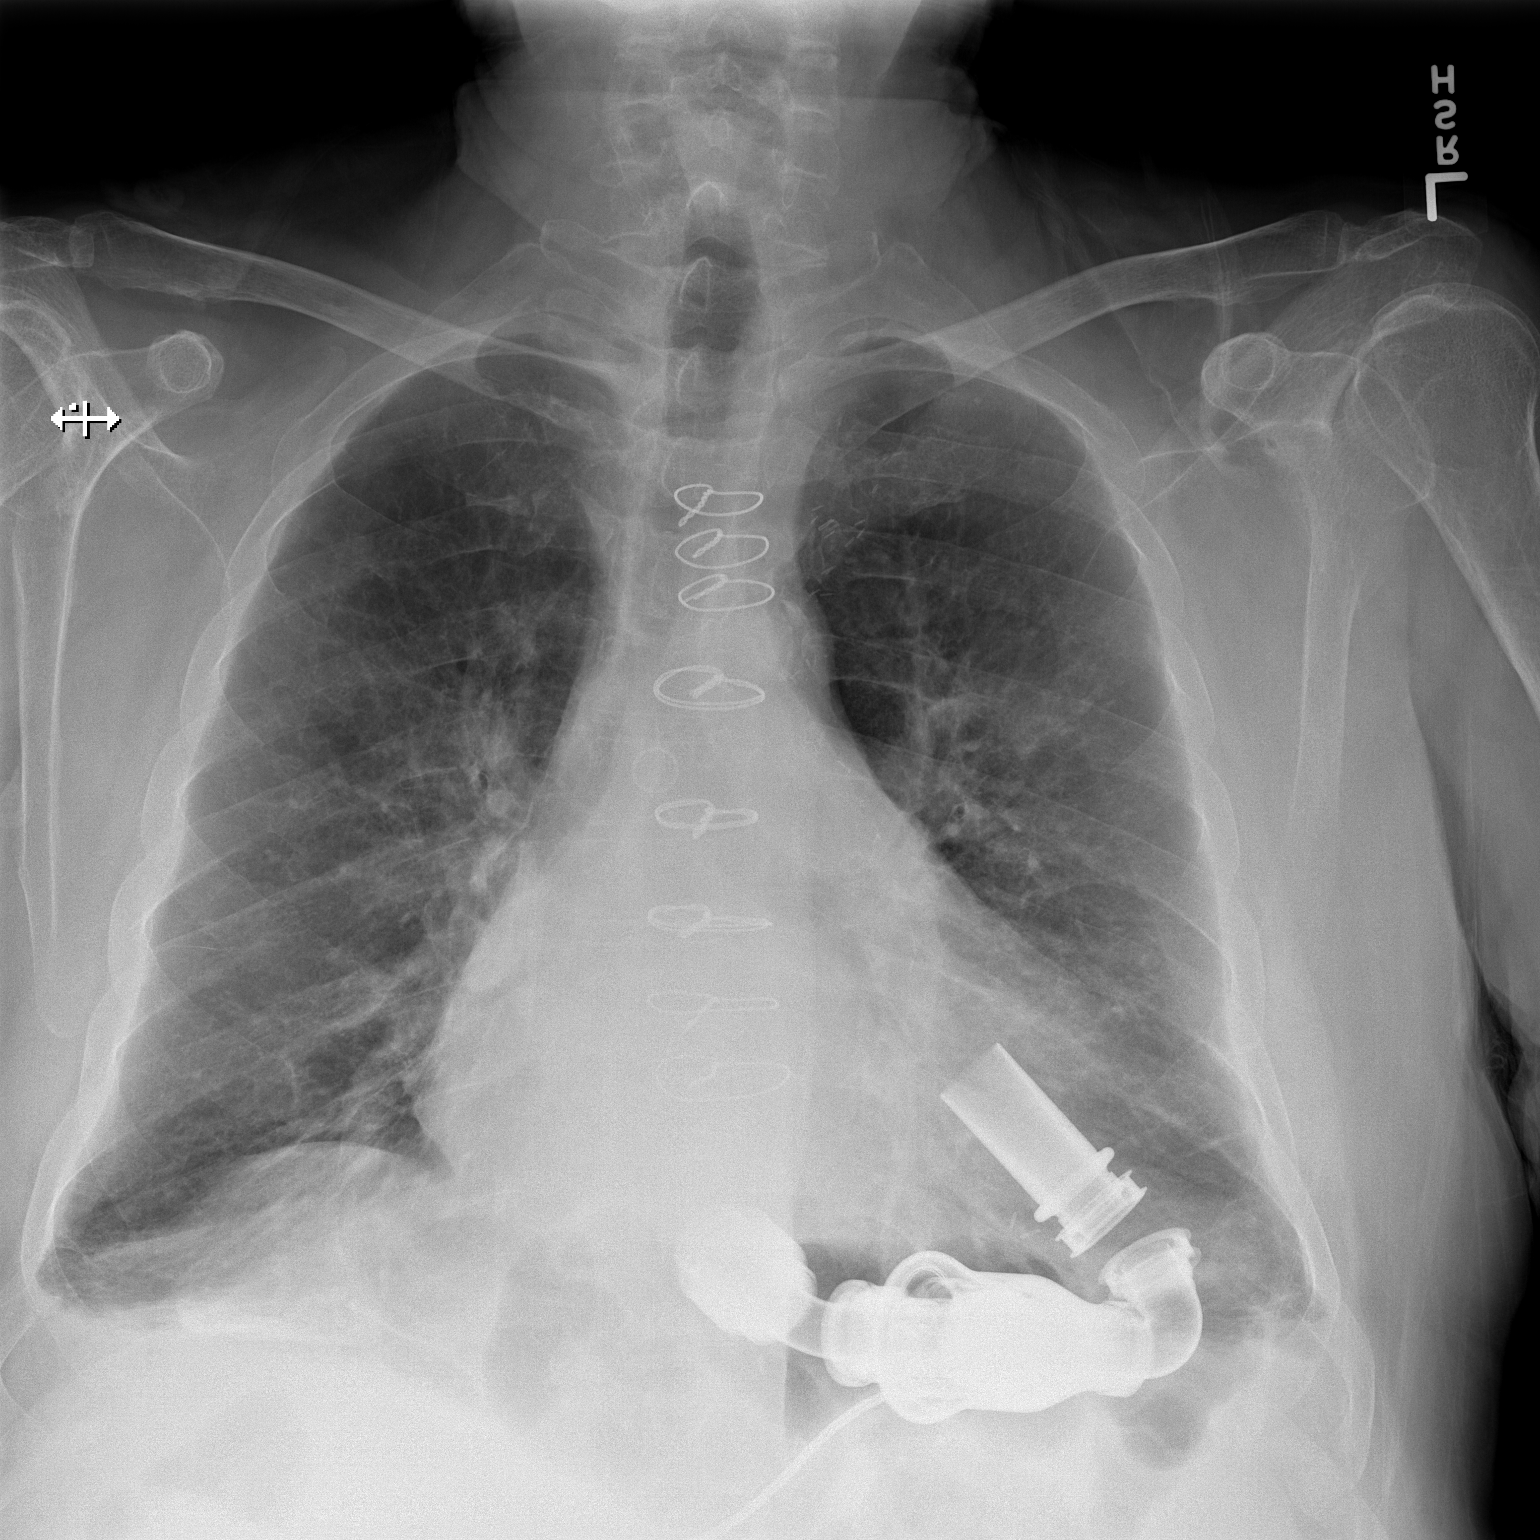

[w chest lat]
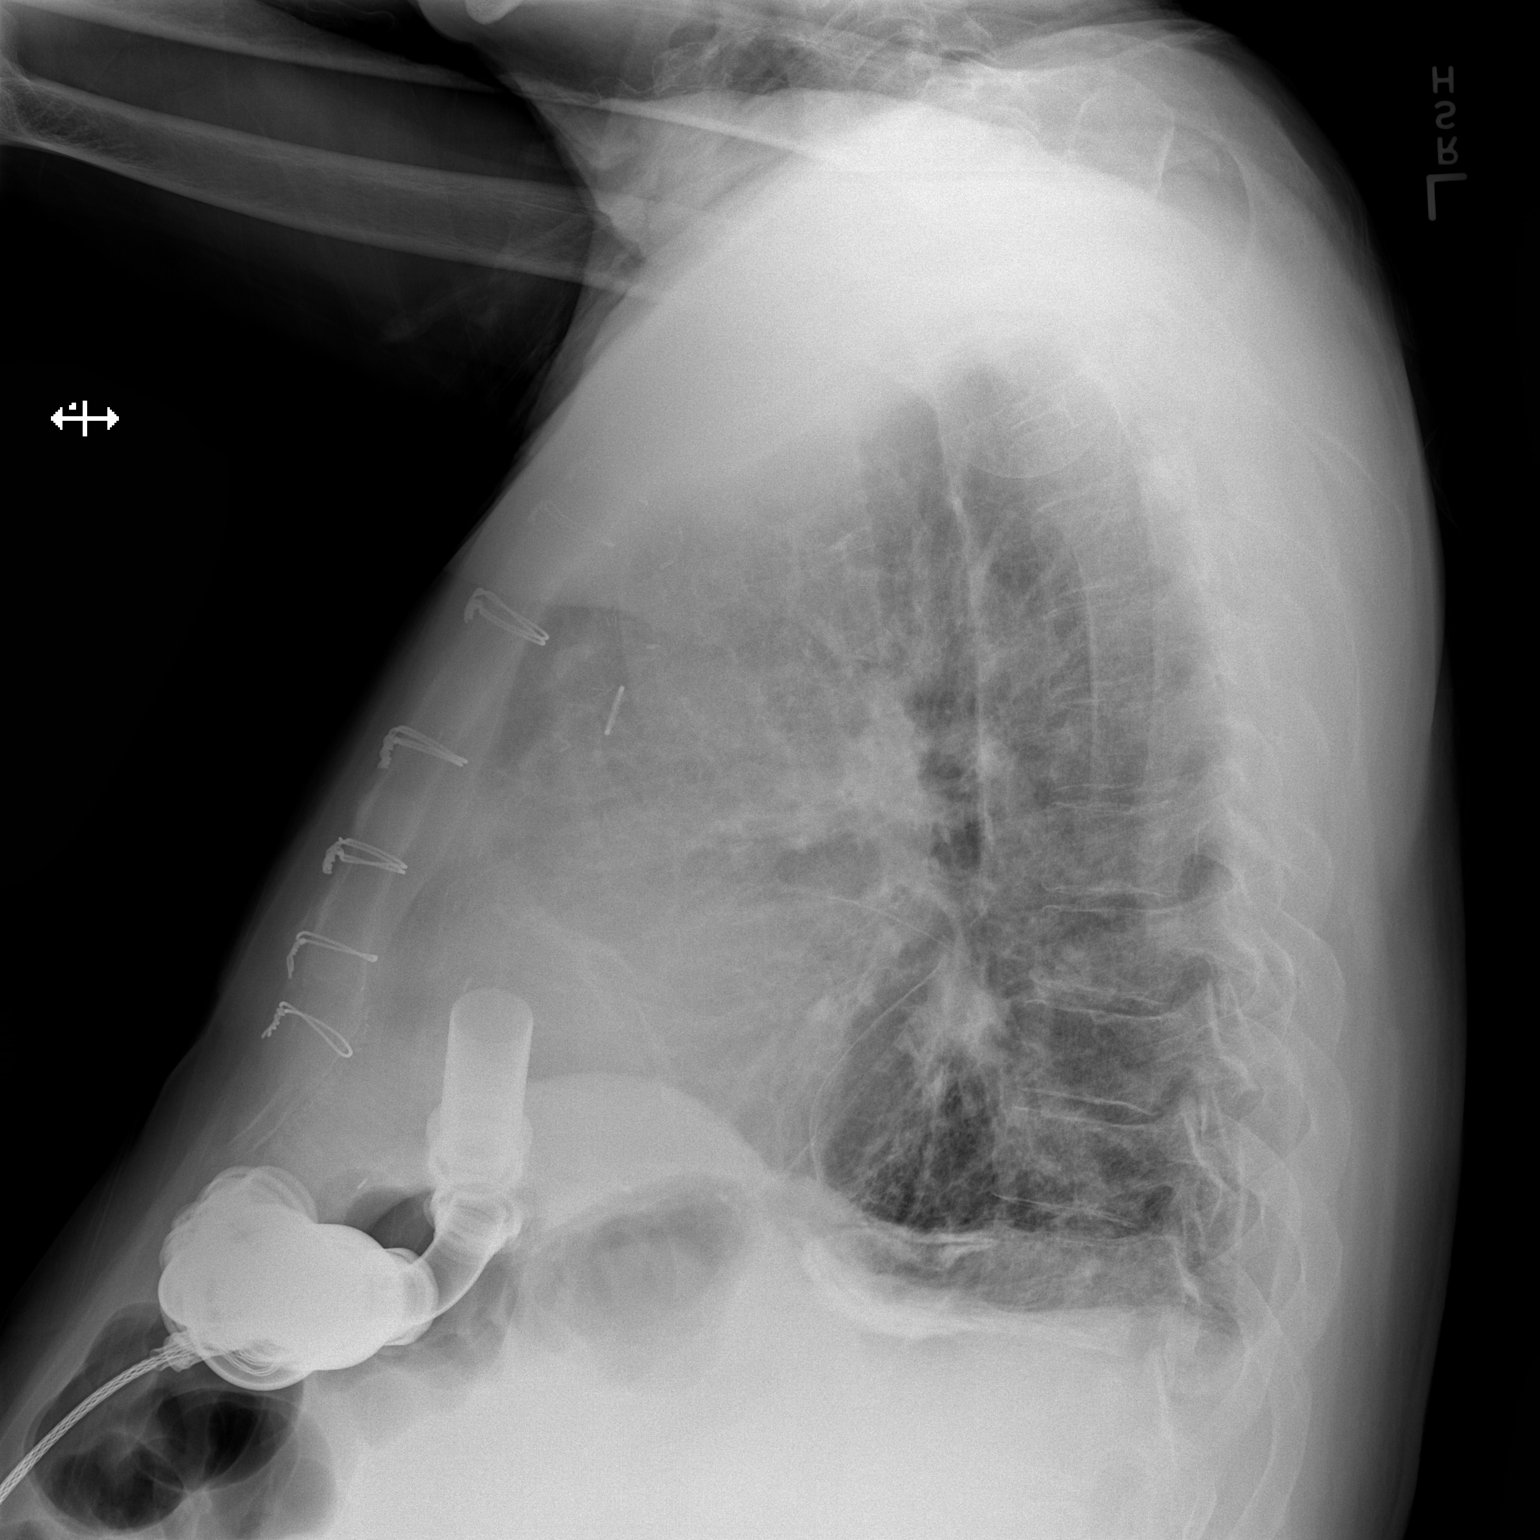

[2 of 2 positions shown; findings below may reference images not displayed]

FINDINGS: Prior CABG. Left ventricular assist device noted in stable position.
Heart size stable. Stable mild bilateral pulmonary interstitial
prominence. No interim change from multiple prior exams. Pleural
calcification. No pleural effusion pneumothorax. No acute bony
abnormality.
IMPRESSION: 1. Prior CABG. Left ventricular assist device in stable position.
Stable cardiomegaly.

2. Chronic interstitial lung disease.
